# Patient Record
Sex: Female | Born: 1960 | Race: Black or African American | Hispanic: No | Smoking: Former smoker
Health system: Southern US, Community
[De-identification: ages and names within clinical notes are randomized; demographics above are authoritative.]

## PROBLEM LIST (undated history)

## (undated) DIAGNOSIS — F32A Depression, unspecified: Secondary | ICD-10-CM

## (undated) DIAGNOSIS — C50919 Malignant neoplasm of unspecified site of unspecified female breast: Secondary | ICD-10-CM

## (undated) DIAGNOSIS — F329 Major depressive disorder, single episode, unspecified: Secondary | ICD-10-CM

## (undated) DIAGNOSIS — F99 Mental disorder, not otherwise specified: Secondary | ICD-10-CM

## (undated) HISTORY — PX: TUBAL LIGATION: SHX77

---

## 2010-08-29 ENCOUNTER — Emergency Department (HOSPITAL_COMMUNITY)
Admission: EM | Admit: 2010-08-29 | Discharge: 2010-08-29 | Disposition: A | Discharge status: Home / Self Care | Payer: Self-pay | Attending: Emergency Medicine | Admitting: Emergency Medicine

## 2010-08-29 ENCOUNTER — Emergency Department (HOSPITAL_COMMUNITY): Payer: Self-pay

## 2010-08-29 DIAGNOSIS — S86909A Unspecified injury of unspecified muscle(s) and tendon(s) at lower leg level, unspecified leg, initial encounter: Secondary | ICD-10-CM | POA: Insufficient documentation

## 2010-08-29 DIAGNOSIS — W268XXA Contact with other sharp object(s), not elsewhere classified, initial encounter: Secondary | ICD-10-CM | POA: Insufficient documentation

## 2010-08-29 IMAGING — CR DG ANKLE 2V *R*
2 series · 2 of 2 positions shown · non-contrast
Comparison: None.

CLINICAL DATA: Glass table fell on of the right foot with
lacerations to the right ankle.

RIGHT ANKLE - 2 VIEW

[t ankle joint ap right]
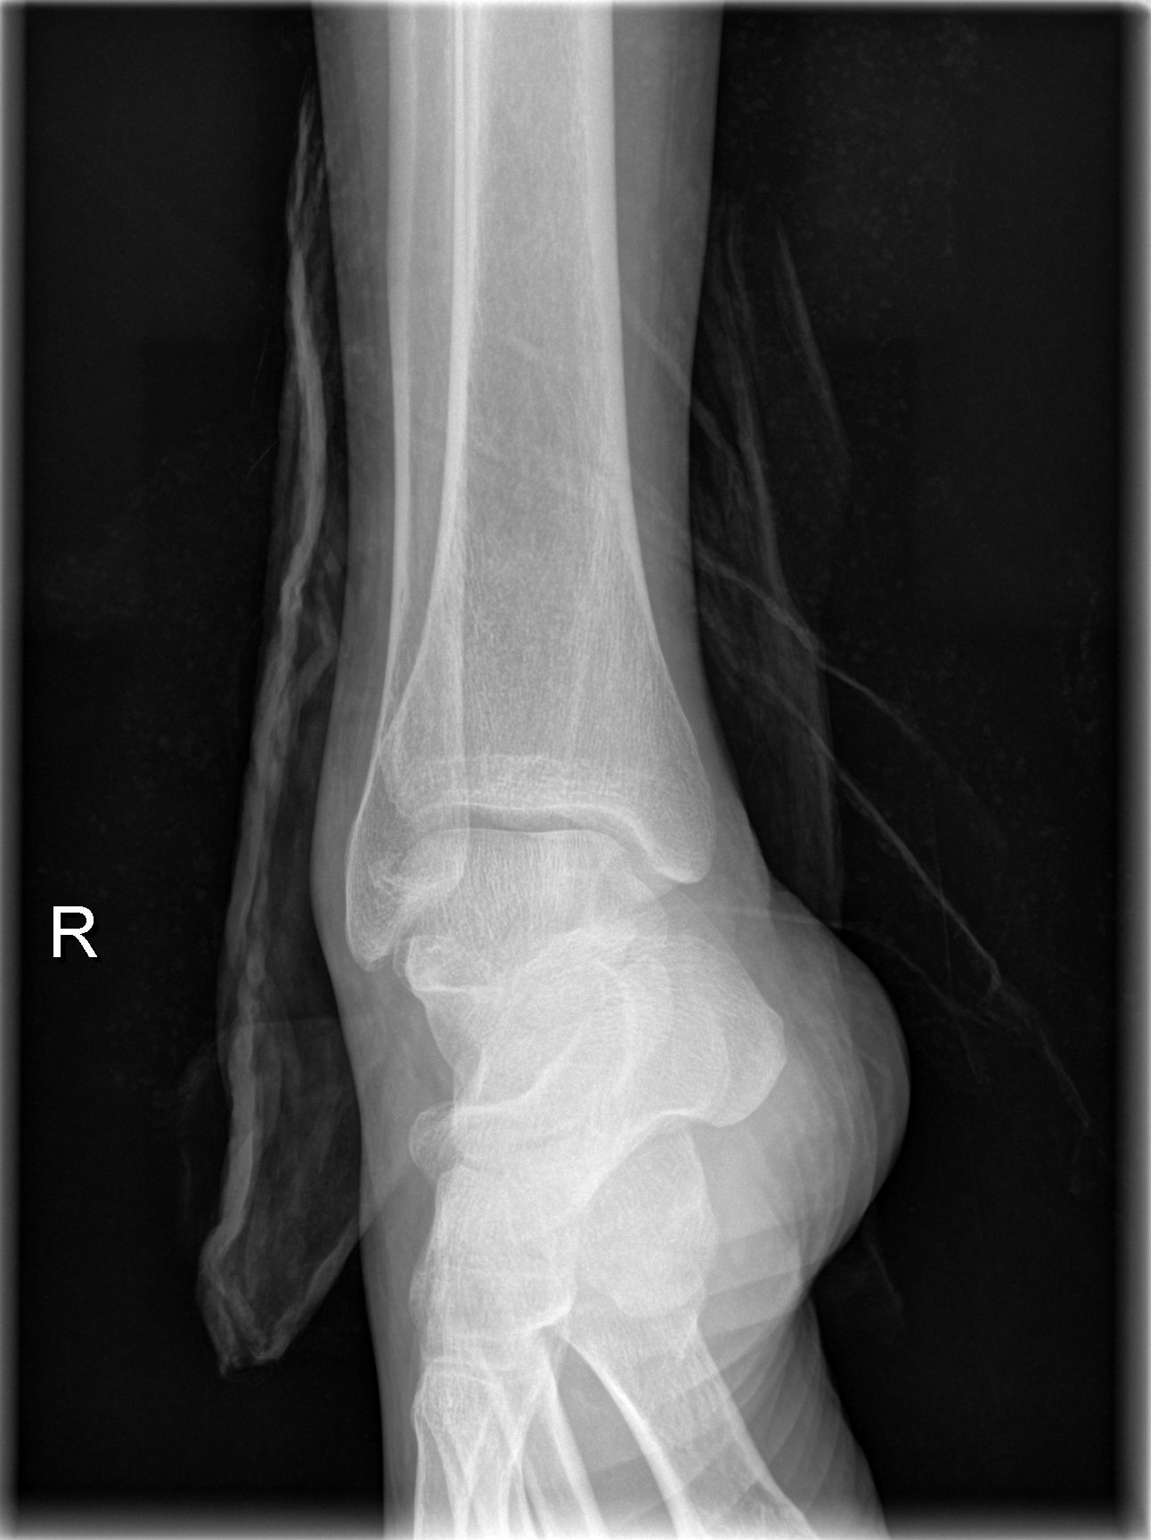

[t ankle joint lat right]
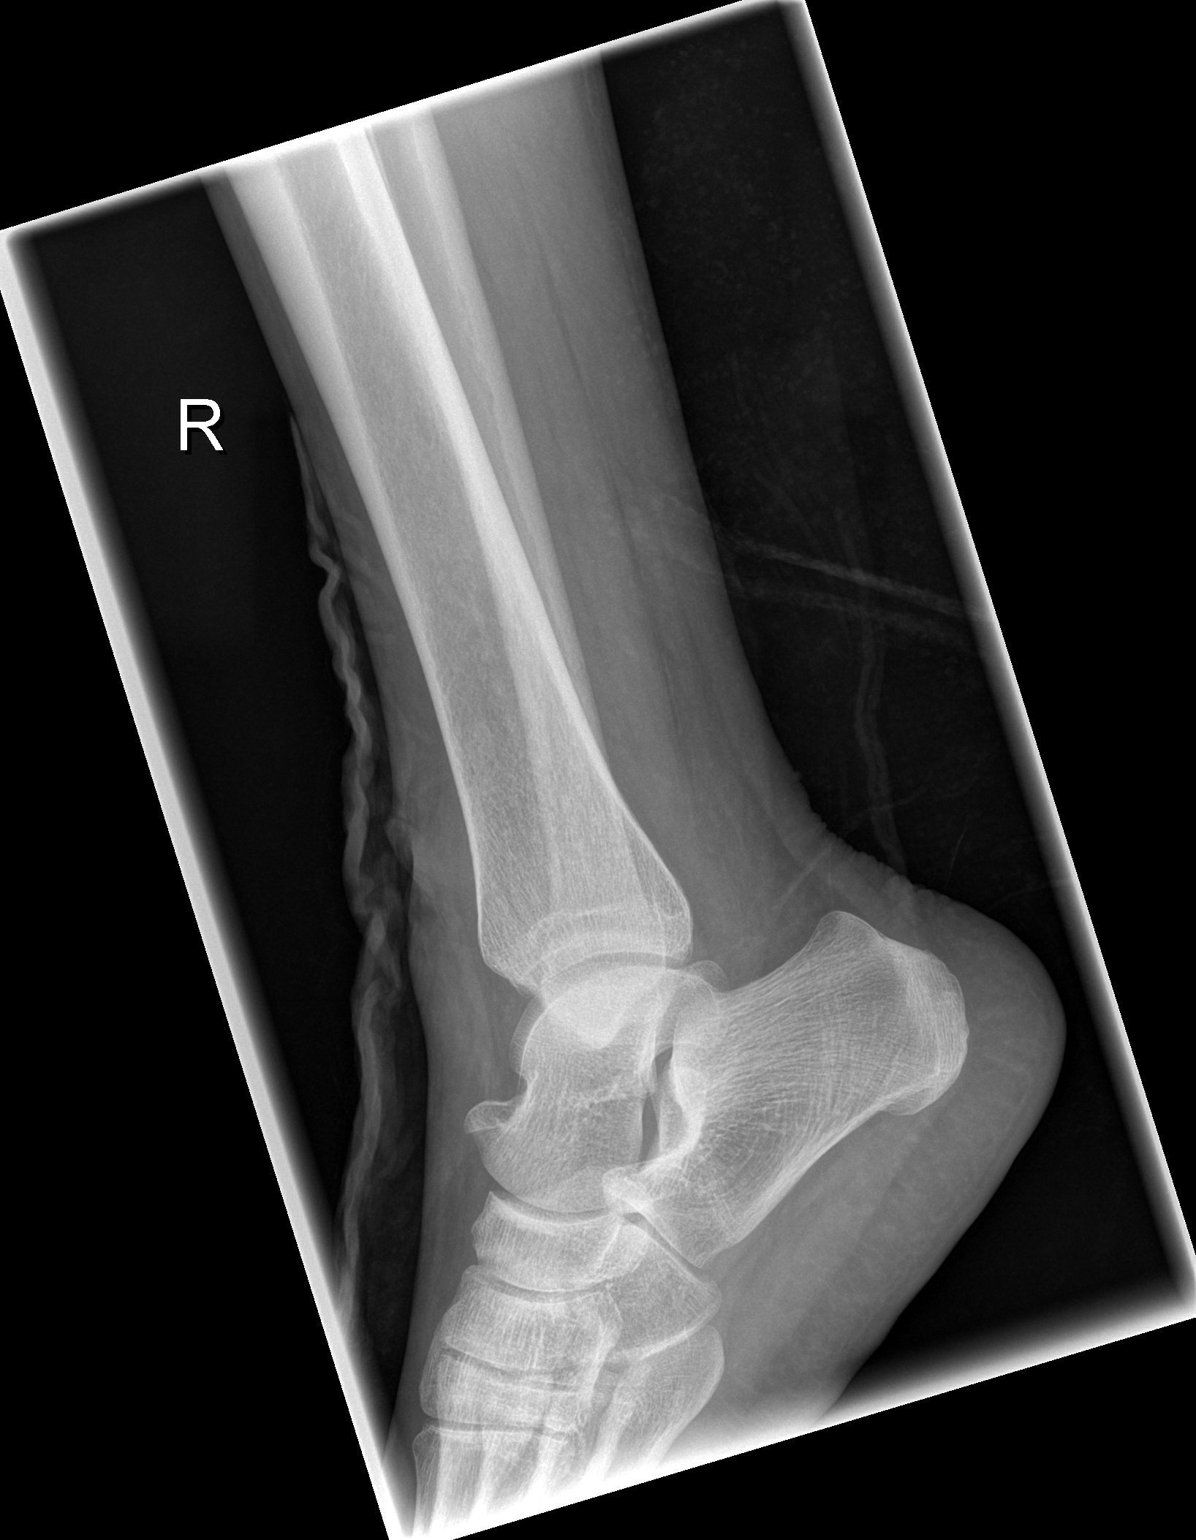

[2 of 2 positions shown; findings below may reference images not displayed]

FINDINGS: Soft tissue defects in the anterior soft tissues over the
ankle consistent with history of laceration.  No radiopaque foreign
bodies are demonstrated in the soft tissues.  No evidence of acute
fracture or subluxation.
IMPRESSION: Soft tissue laceration.  No radiopaque foreign bodies demonstrated.
No acute bony abnormalities.

## 2010-10-10 ENCOUNTER — Emergency Department (HOSPITAL_COMMUNITY)
Admission: EM | Admit: 2010-10-10 | Discharge: 2010-10-10 | Disposition: A | Discharge status: Home / Self Care | Payer: Self-pay | Attending: Emergency Medicine | Admitting: Emergency Medicine

## 2010-10-10 DIAGNOSIS — Z4802 Encounter for removal of sutures: Secondary | ICD-10-CM | POA: Insufficient documentation

## 2011-02-02 ENCOUNTER — Emergency Department (HOSPITAL_COMMUNITY)
Admission: EM | Admit: 2011-02-02 | Discharge: 2011-02-02 | Disposition: A | Discharge status: Home / Self Care | Payer: Self-pay | Attending: Emergency Medicine | Admitting: Emergency Medicine

## 2011-02-02 ENCOUNTER — Emergency Department (HOSPITAL_COMMUNITY): Payer: Self-pay

## 2011-02-02 ENCOUNTER — Encounter: Payer: Self-pay | Admitting: Emergency Medicine

## 2011-02-02 DIAGNOSIS — S20219A Contusion of unspecified front wall of thorax, initial encounter: Secondary | ICD-10-CM

## 2011-02-02 DIAGNOSIS — R109 Unspecified abdominal pain: Secondary | ICD-10-CM | POA: Insufficient documentation

## 2011-02-02 DIAGNOSIS — I10 Essential (primary) hypertension: Secondary | ICD-10-CM

## 2011-02-02 LAB — DIFFERENTIAL
Eosinophils Absolute: 0 10*3/uL (ref 0.0–0.7)
Eosinophils Relative: 0 % (ref 0–5)
Lymphs Abs: 1.9 10*3/uL (ref 0.7–4.0)
Monocytes Absolute: 0.7 10*3/uL (ref 0.1–1.0)
Monocytes Relative: 8 % (ref 3–12)
Neutrophils Relative %: 70 % (ref 43–77)

## 2011-02-02 LAB — CBC
HCT: 42.7 % (ref 36.0–46.0)
Hemoglobin: 14.7 g/dL (ref 12.0–15.0)
MCH: 32.3 pg (ref 26.0–34.0)
MCV: 93.8 fL (ref 78.0–100.0)
RBC: 4.55 MIL/uL (ref 3.87–5.11)

## 2011-02-02 IMAGING — CR DG RIBS W/ CHEST 3+V*L*
3 series · 3 of 3 positions shown · non-contrast
Comparison: None.

CLINICAL DATA: Left anterior rib pain following a fall.  Shortness
of breath.

LEFT RIBS AND CHEST - 3+ VIEW

[w chest pa]
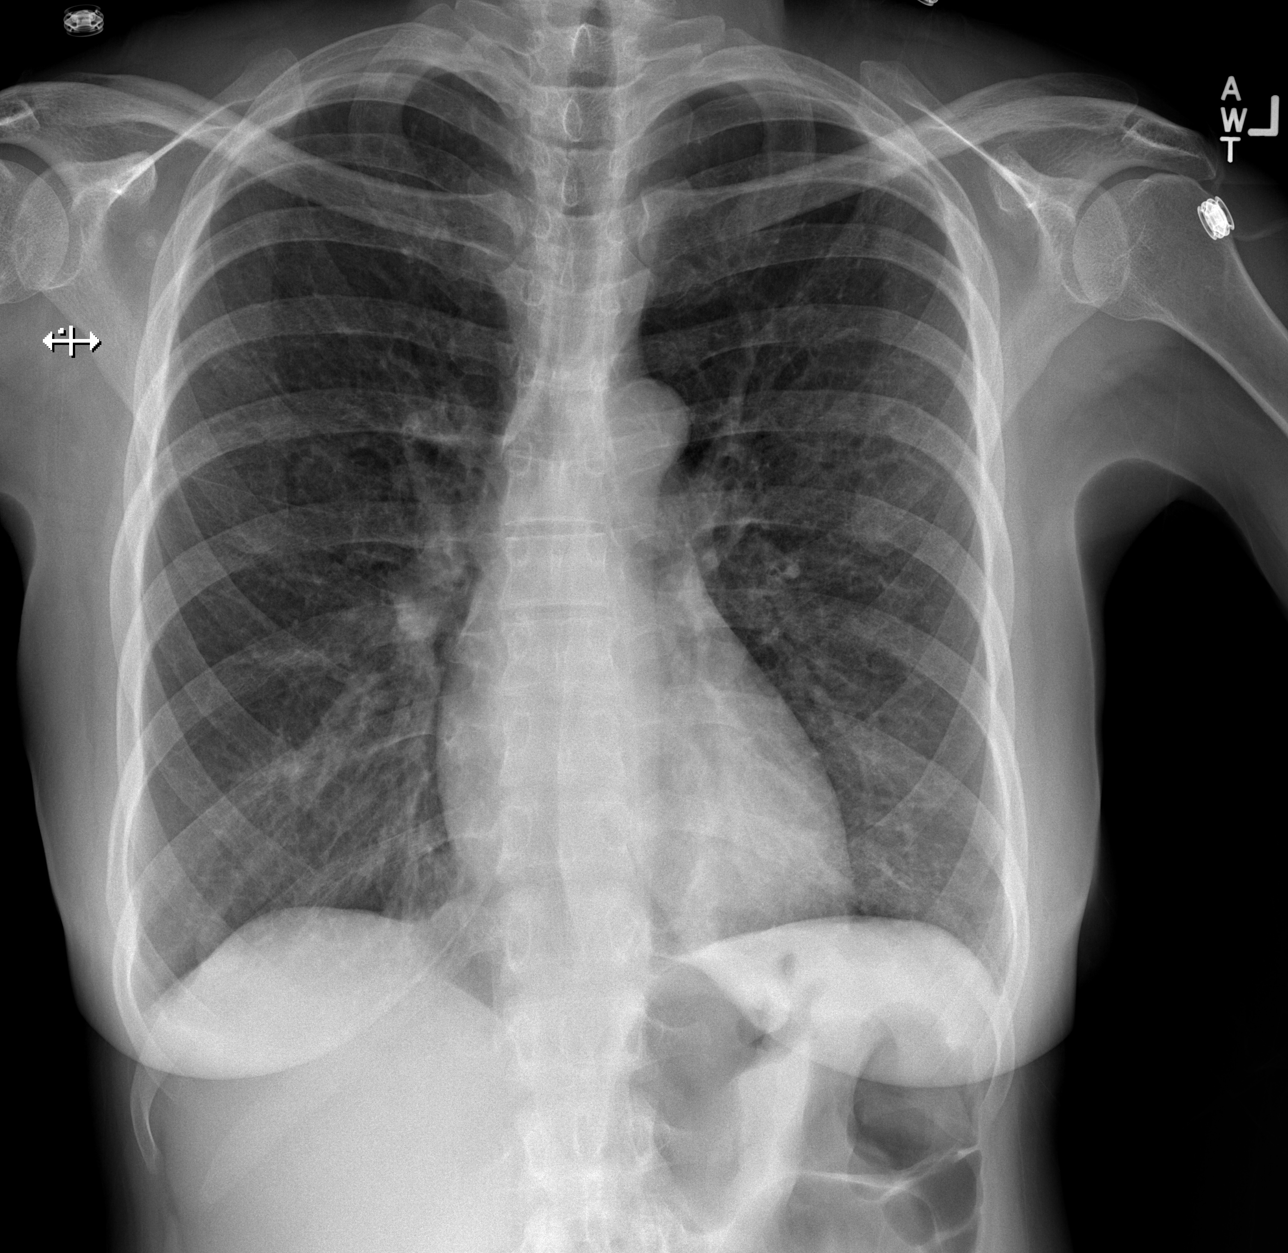

[w ribs ap upper left]
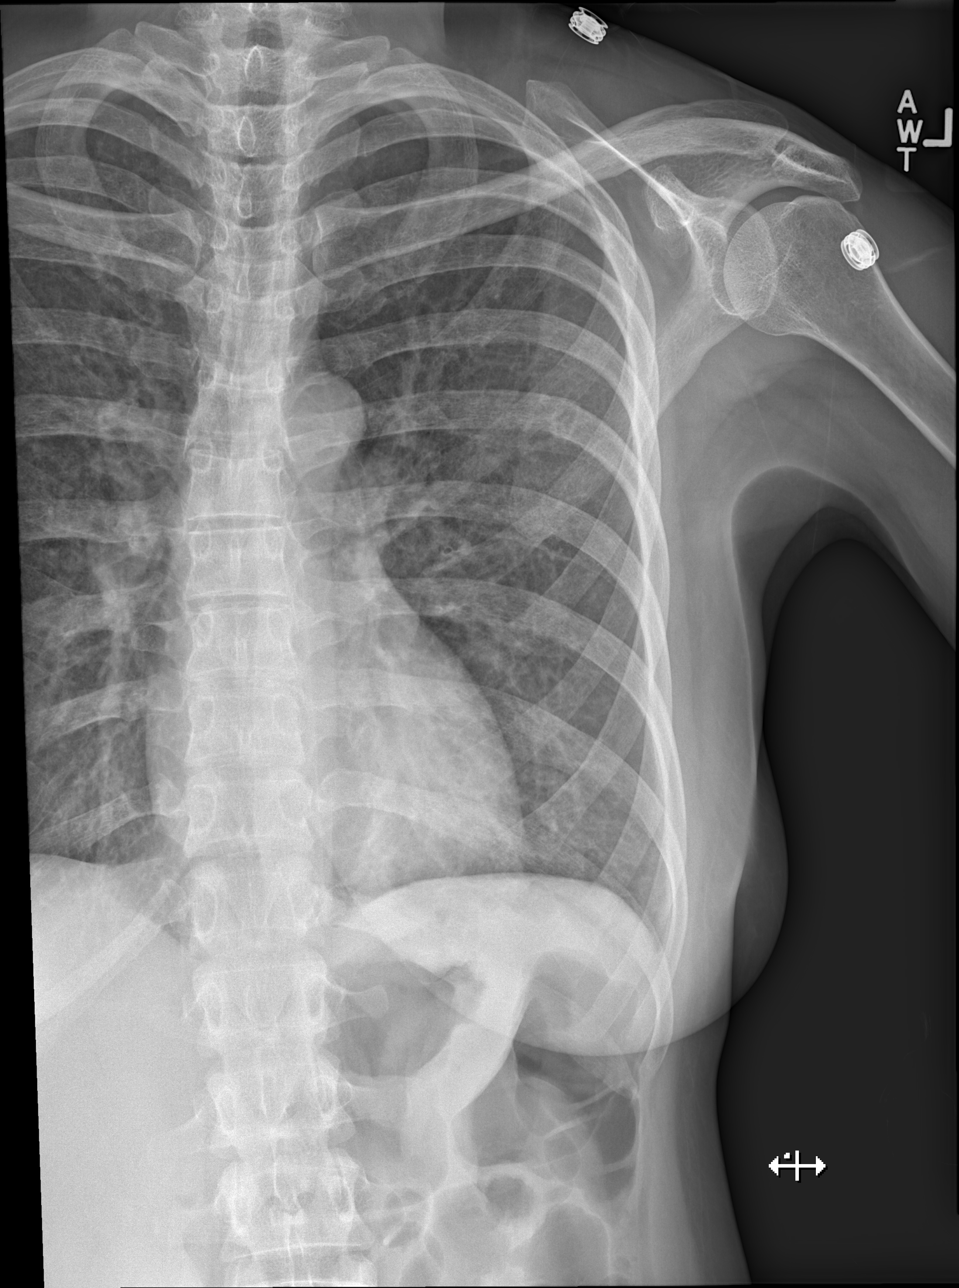

[w ribs ap lower left]
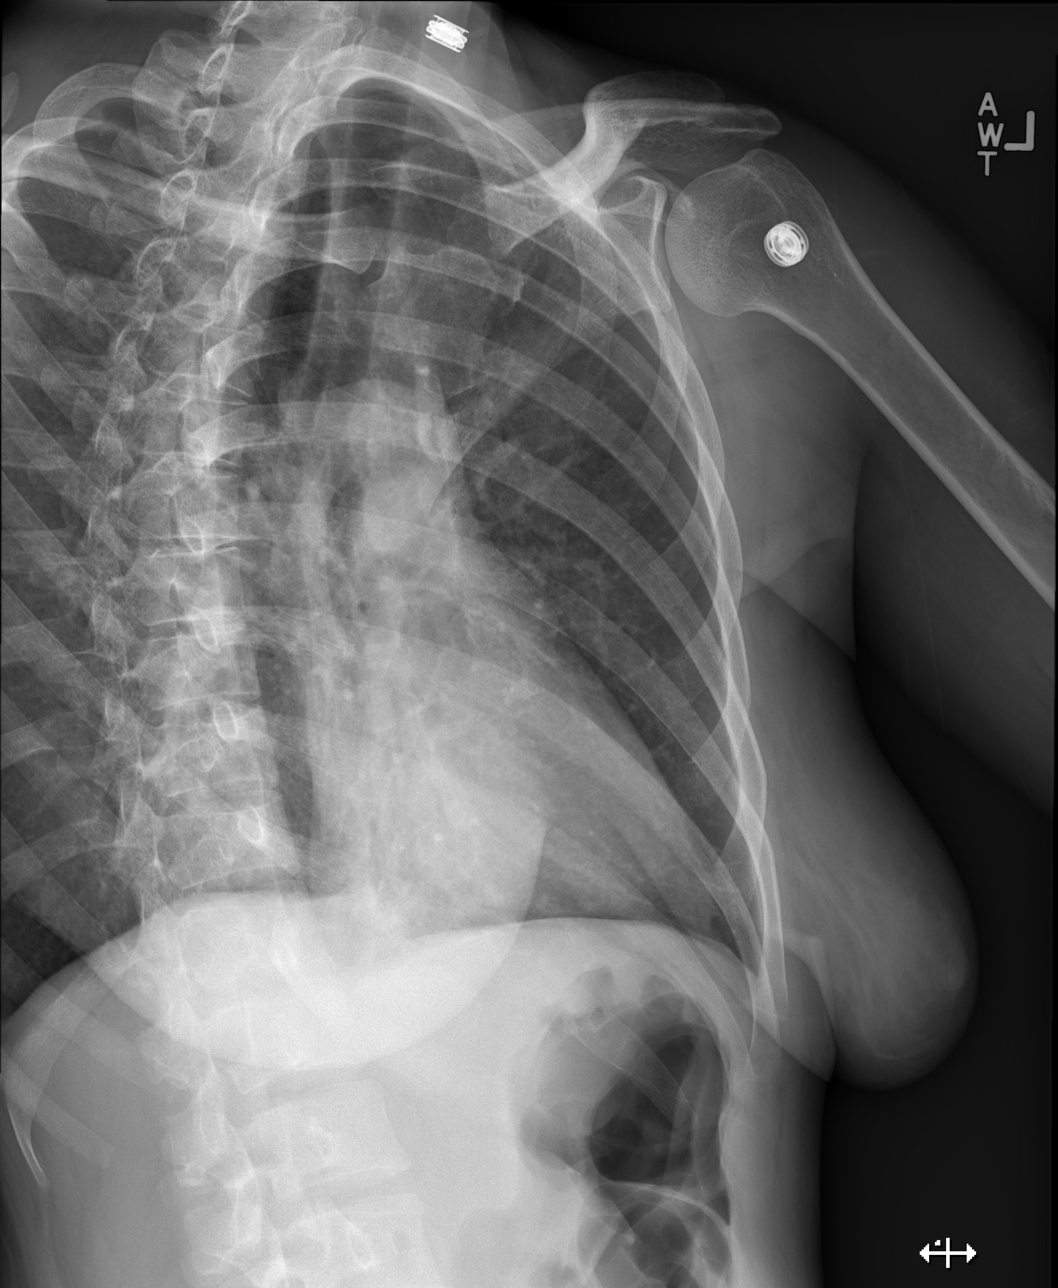

[3 of 3 positions shown; findings below may reference images not displayed]

FINDINGS: Normal sized heart.  Clear lungs.  The lungs are
hyperexpanded with mild diffuse prominence of the pulmonary
vasculature and interstitial markings.  No fracture or pneumothorax
seen.
IMPRESSION: 1.  No fracture or acute abnormality.
2.  Changes of COPD and chronic bronchitis.

## 2011-02-02 MED ORDER — ONDANSETRON HCL 4 MG/2ML IJ SOLN
4.0000 mg | Freq: Once | INTRAMUSCULAR | Status: AC
  Administered 2011-02-02: 4 mg via INTRAVENOUS
  Filled 2011-02-02: qty 2

## 2011-02-02 MED ORDER — HYDROCODONE-ACETAMINOPHEN 5-325 MG PO TABS: 1.0000 | ORAL_TABLET | Freq: Four times a day (QID) | ORAL | Status: AC | PRN

## 2011-02-02 MED ORDER — OXYCODONE-ACETAMINOPHEN 5-325 MG PO TABS
1.0000 | ORAL_TABLET | Freq: Once | ORAL | Status: AC
  Administered 2011-02-02: 1 via ORAL
  Filled 2011-02-02: qty 1

## 2011-02-02 MED ORDER — NAPROXEN 500 MG PO TABS: 500.0000 mg | ORAL_TABLET | Freq: Two times a day (BID) | ORAL | Status: DC

## 2011-02-02 MED ORDER — HYDROMORPHONE HCL PF 1 MG/ML IJ SOLN
1.0000 mg | Freq: Once | INTRAMUSCULAR | Status: AC
  Administered 2011-02-02: 1 mg via INTRAVENOUS
  Filled 2011-02-02: qty 1

## 2011-02-02 NOTE — ED Notes (Signed)
[id number] Expected date:02/02/11<BR> Expected time: 6:00 PM<BR> Means of arrival:Ambulance<BR> Comments:<BR> EMS 52 GC 45 yof  breathing difficulty

## 2011-02-02 NOTE — ED Notes (Signed)
Transported to xray dept

## 2011-02-02 NOTE — ED Notes (Signed)
Pt states that she fell because the floor was wet when she was getting out of the shower and she hit the edge of the sink in the bathroom (two days ago). States that it has gotten worse and it hurts to move. Area is swollen slightly below her left breast. She is having a little bit of trouble breathing but she states she had a cold last week that had not yet resolved completely. In NAD at this time.

## 2011-02-02 NOTE — ED Provider Notes (Signed)
History     CSN: [id number] Arrival date & time: 02/02/2011  6:20 PM   First MD Initiated Contact with Patient 02/02/11 1820      Chief Complaint  Patient presents with  . Flank Pain    (Consider location/radiation/quality/duration/timing/severity/associated sxs/prior treatment) HPI Patient states she fell on the wet floor 2 days ago when she was getting a shower. Patient then hit her left ribs on the corner of the sink. Patient states since that time she's been having pain below her left breast. The area is swollen and tender to the touch. Patient states it hurts when she takes a deep breath. It also hurts when she moves. The pain is moderate to severe. It has not been improving at all. The pain is very sharp in nature. Patient denies any head injury numbness or weakness she denies any abdominal pain. She has not noticed any blood or urine. There's been no nausea or vomiting. History reviewed. No pertinent past medical history.  Past Surgical History  Procedure Date  . Tubal ligation     History reviewed. No pertinent family history.  History  Substance Use Topics  . Smoking status: Current Everyday Smoker  . Smokeless tobacco: Not on file  . Alcohol Use: Yes     on the weekends she has a beer.     OB History    Grav Para Term Preterm Abortions TAB SAB Ect Mult Living                  Review of Systems  All other systems reviewed and are negative.    Allergies  Review of patient's allergies indicates no known allergies.  Home Medications  No current outpatient prescriptions on file.  BP 187/95  Pulse 111  Temp(Src) 98.7 F (37.1 C) (Oral)  Resp 22  SpO2 96%  LMP 01/19/2011  Physical Exam  Nursing note and vitals reviewed. Constitutional: She appears well-developed and well-nourished. No distress.       Hypertensive  HENT:  Head: Normocephalic and atraumatic.  Right Ear: External ear normal.  Left Ear: External ear normal.  Eyes: Conjunctivae are  normal. Right eye exhibits no discharge. Left eye exhibits no discharge. No scleral icterus.  Neck: Neck supple. No tracheal deviation present.  Cardiovascular: Normal rate, regular rhythm and intact distal pulses.   Pulmonary/Chest: Effort normal and breath sounds normal. No stridor. No respiratory distress. She has no wheezes. She has no rales. She exhibits tenderness.    Abdominal: Soft. Bowel sounds are normal. She exhibits no distension. There is no tenderness. There is no rebound and no guarding.  Musculoskeletal: She exhibits no edema and no tenderness.  Neurological: She is alert. She has normal strength. No sensory deficit. Cranial nerve deficit:  no gross defecits noted. She exhibits normal muscle tone. She displays no seizure activity. Coordination normal.  Skin: Skin is warm and dry. No rash noted.  Psychiatric: She has a normal mood and affect.    ED Course  Procedures (including critical care time)   Labs Reviewed  CBC  DIFFERENTIAL  BASIC METABOLIC PANEL   Dg Ribs Unilateral W/chest Left  02/02/2011  *RADIOLOGY REPORT*  Clinical Data: Left anterior rib pain following a fall.  Shortness of breath.  LEFT RIBS AND CHEST - 3+ VIEW  Comparison: None.  Findings: Normal sized heart.  Clear lungs.  The lungs are hyperexpanded with mild diffuse prominence of the pulmonary vasculature and interstitial markings.  No fracture or pneumothorax seen.  IMPRESSION:  1.  No fracture or acute abnormality. 2.  Changes of COPD and chronic bronchitis.  Original Report Authenticated By: Gerald Stabs, M.D.    MDM  Patient without signs of pneumonia or pneumothorax on the chest x-ray. There are no obvious rib fracture though certainly she could have occult rib fractures that are not evident on the chest x-ray. Patient remains hemodynamically stable. She did have hypertension noted on her vital signs. Patient states she does not have a history of this. It could be related to the pain have her do  recommend that she have this followed up as an outpatient.      Kathalene Frames, MD 02/02/11 2045

## 2011-12-23 ENCOUNTER — Emergency Department (HOSPITAL_COMMUNITY)
Admission: EM | Admit: 2011-12-23 | Discharge: 2011-12-25 | Disposition: A | Discharge status: Other Acute Inpatient Hospital | Payer: Self-pay | Attending: Emergency Medicine | Admitting: Emergency Medicine

## 2011-12-23 ENCOUNTER — Encounter (HOSPITAL_COMMUNITY): Payer: Self-pay | Admitting: *Deleted

## 2011-12-23 DIAGNOSIS — F29 Unspecified psychosis not due to a substance or known physiological condition: Secondary | ICD-10-CM | POA: Insufficient documentation

## 2011-12-23 DIAGNOSIS — F329 Major depressive disorder, single episode, unspecified: Secondary | ICD-10-CM | POA: Insufficient documentation

## 2011-12-23 DIAGNOSIS — F1010 Alcohol abuse, uncomplicated: Secondary | ICD-10-CM | POA: Insufficient documentation

## 2011-12-23 DIAGNOSIS — F3289 Other specified depressive episodes: Secondary | ICD-10-CM | POA: Insufficient documentation

## 2011-12-23 DIAGNOSIS — R45850 Homicidal ideations: Secondary | ICD-10-CM | POA: Insufficient documentation

## 2011-12-23 DIAGNOSIS — R45851 Suicidal ideations: Secondary | ICD-10-CM | POA: Insufficient documentation

## 2011-12-23 LAB — CBC WITH DIFFERENTIAL/PLATELET
Eosinophils Absolute: 0 10*3/uL (ref 0.0–0.7)
Eosinophils Relative: 1 % (ref 0–5)
Lymphs Abs: 3.2 10*3/uL (ref 0.7–4.0)
MCH: 32.4 pg (ref 26.0–34.0)
MCV: 93.5 fL (ref 78.0–100.0)
Platelets: 286 10*3/uL (ref 150–400)
RBC: 4.29 MIL/uL (ref 3.87–5.11)

## 2011-12-23 LAB — COMPREHENSIVE METABOLIC PANEL
ALT: 13 U/L (ref 0–35)
BUN: 6 mg/dL (ref 6–23)
Calcium: 9.3 mg/dL (ref 8.4–10.5)
GFR calc Af Amer: >90 mL/min (ref >90)
Glucose, Bld: 86 mg/dL (ref 70–99)
Sodium: 134 mEq/L — ABNORMAL LOW (ref 135–145)
Total Protein: 7.7 g/dL (ref 6.0–8.3)

## 2011-12-23 LAB — ETHANOL: Alcohol, Ethyl (B): 256 mg/dL — ABNORMAL HIGH (ref 0–11)

## 2011-12-23 LAB — RAPID URINE DRUG SCREEN, HOSP PERFORMED
Benzodiazepines: NOT DETECTED
Cocaine: NOT DETECTED
Opiates: NOT DETECTED

## 2011-12-23 NOTE — ED Notes (Signed)
Pt belongings in triage locker 4.  Has been seen and searched by security.

## 2011-12-23 NOTE — ED Notes (Signed)
Pt c/o feeling suicidal; states is a danger to herself; has been cutting herself; states having thoughts x 2 months; tearful; states feels like going to have a nervous breakdown; pt changed into scrubs and wanded by security

## 2011-12-23 NOTE — ED Provider Notes (Signed)
History     CSN: [id number]  Arrival date & time 12/23/11  2042   First MD Initiated Contact with Patient 12/23/11 2133      Chief Complaint  Patient presents with  . Medical Clearance    (Consider location/radiation/quality/duration/timing/severity/associated sxs/prior treatment) HPI This 51 year old female complains of a few months aggressively worsening anxiety depression stress with intermittent suicidal and homicidal ideation with auditory hallucinations getting her commands to tell her to hit her head or hit herself she states she knows that is wrong so is coming here for help now. She has no recent self-mutilation. She does have scars on her legs from prior self cutting over the last few months. She is no headache no fever no chest pain no shortness breath no cough no abdominal pain no vomiting no rashes no recent medical illnesses. She states the auditory hallucinations of gradually gotten worse where now she has difficulty functioning and has not slept in the last few days. She states she will not hurt herself in the emergency department and wants help. She does have suicidal ideation homicidal ideation and auditory hallucinations ongoing worsening over the last few days but gradually started a few months ago. There is no treatment prior to arrival. She did overdosing earlier today on anti-inflammatory pills but states she did not do so. Past Medical History  Diagnosis Date  . Mental disorder   . Depression     Past Surgical History  Procedure Date  . Tubal ligation     No family history on file.  History  Substance Use Topics  . Smoking status: Current Every Day Smoker -- 0.5 packs/day for 30 years  . Smokeless tobacco: Not on file  . Alcohol Use: Yes     on the weekends she has a beer.     OB History    Grav Para Term Preterm Abortions TAB SAB Ect Mult Living                  Review of Systems 10 Systems reviewed and are negative for acute change except as  noted in the HPI. Allergies  Review of patient's allergies indicates no known allergies.  Home Medications   No current outpatient prescriptions on file.  BP 113/76  Pulse 78  Temp 97.9 F (36.6 C) (Oral)  Resp 18  SpO2 100%  Physical Exam  Nursing note and vitals reviewed. Constitutional:       Awake, alert, nontoxic appearance with baseline speech for patient.  HENT:  Head: Atraumatic.  Mouth/Throat: No oropharyngeal exudate.  Eyes: EOM are normal. Pupils are equal, round, and reactive to light. Right eye exhibits no discharge. Left eye exhibits no discharge.  Neck: Neck supple.  Cardiovascular: Normal rate and regular rhythm.   No murmur heard. Pulmonary/Chest: Effort normal and breath sounds normal. No stridor. No respiratory distress. She has no wheezes. She has no rales. She exhibits no tenderness.  Abdominal: Soft. Bowel sounds are normal. She exhibits no mass. There is no tenderness. There is no rebound.  Musculoskeletal: She exhibits no tenderness.       Baseline ROM, moves extremities with no obvious new focal weakness.  Lymphadenopathy:    She has no cervical adenopathy.  Neurological: She is alert.       Awake, alert, cooperative and aware of situation; motor strength bilaterally; sensation normal to light touch bilaterally; peripheral visual fields full to confrontation; no facial asymmetry; tongue midline; major cranial nerves appear intact; no pronator drift, normal finger to  nose bilaterally, baseline gait without new ataxia.  Skin: No rash noted.  Psychiatric:       Anxious depressed and tearful suicidal homicidal psychotic states she will not herself or others in the emergency department    ED Course  Procedures (including critical care time)  Labs Reviewed  CBC WITH DIFFERENTIAL - Abnormal; Notable for the following:    Neutrophils Relative 39 (*)     Lymphocytes Relative 53 (*)     All other components within normal limits  COMPREHENSIVE METABOLIC  PANEL - Abnormal; Notable for the following:    Sodium 134 (*)     Potassium 3.4 (*)     Total Bilirubin 0.2 (*)     All other components within normal limits  ETHANOL - Abnormal; Notable for the following:    Alcohol, Ethyl (B) 256 (*)     All other components within normal limits  SALICYLATE LEVEL - Abnormal; Notable for the following:    Salicylate Lvl 123456 (*)     All other components within normal limits  URINE RAPID DRUG SCREEN (HOSP PERFORMED)  ACETAMINOPHEN LEVEL  PREGNANCY, URINE   No results found.   1. Suicidal ideation   2. Homicidal ideation   3. Psychosis   4. Alcohol abuse       MDM  Patient / Family / Caregiver understand and agree with initial ED impression and plan with expectations set for ED visit. Dispo pending.        Babette Relic, MD 12/26/11 [phone]

## 2011-12-24 MED ORDER — ZOLPIDEM TARTRATE 5 MG PO TABS
5.0000 mg | ORAL_TABLET | Freq: Every evening | ORAL | Status: DC | PRN
  Administered 2011-12-24 (×2): 5 mg via ORAL
  Filled 2011-12-24 (×2): qty 1

## 2011-12-24 MED ORDER — ACETAMINOPHEN 325 MG PO TABS
650.0000 mg | ORAL_TABLET | ORAL | Status: DC | PRN
  Administered 2011-12-24 – 2011-12-25 (×2): 650 mg via ORAL
  Filled 2011-12-24 (×3): qty 2

## 2011-12-24 MED ORDER — ALUM & MAG HYDROXIDE-SIMETH 200-200-20 MG/5ML PO SUSP: 30.0000 mL | ORAL | Status: DC | PRN

## 2011-12-24 MED ORDER — ONDANSETRON HCL 4 MG PO TABS: 4.0000 mg | ORAL_TABLET | Freq: Three times a day (TID) | ORAL | Status: DC | PRN

## 2011-12-24 MED ORDER — LORAZEPAM 1 MG PO TABS: 1.0000 mg | ORAL_TABLET | Freq: Three times a day (TID) | ORAL | Status: DC | PRN
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
## 2011-12-24 NOTE — ED Provider Notes (Signed)
On my AM rounds the patient is asleep.   Per RN, no overnight events.  Carmin Muskrat, MD 12/24/11 [phone]

## 2011-12-24 NOTE — ED Notes (Signed)
PA called and informed pt c/o insomnia and h/a. PA stated he would put in orders.

## 2011-12-24 NOTE — ED Notes (Signed)
Family at bedside. 

## 2011-12-24 NOTE — ED Notes (Signed)
[id number] Expected date:<BR> Expected time:<BR> Means of arrival:<BR> Comments:<BR> Hold no tv

## 2011-12-25 ENCOUNTER — Inpatient Hospital Stay (HOSPITAL_COMMUNITY)
Admission: AD | Admit: 2011-12-25 | Discharge: 2011-12-29 | DRG: 897 | Disposition: A | Discharge status: Home / Self Care | Payer: No Typology Code available for payment source | Source: Ambulatory Visit | Attending: Psychiatry | Admitting: Psychiatry

## 2011-12-25 ENCOUNTER — Encounter (HOSPITAL_COMMUNITY): Payer: Self-pay | Admitting: *Deleted

## 2011-12-25 DIAGNOSIS — F1020 Alcohol dependence, uncomplicated: Secondary | ICD-10-CM

## 2011-12-25 DIAGNOSIS — F29 Unspecified psychosis not due to a substance or known physiological condition: Secondary | ICD-10-CM

## 2011-12-25 DIAGNOSIS — F10151 Alcohol abuse with alcohol-induced psychotic disorder with hallucinations: Secondary | ICD-10-CM | POA: Diagnosis present

## 2011-12-25 DIAGNOSIS — F10988 Alcohol use, unspecified with other alcohol-induced disorder: Principal | ICD-10-CM | POA: Diagnosis present

## 2011-12-25 DIAGNOSIS — Z23 Encounter for immunization: Secondary | ICD-10-CM

## 2011-12-25 HISTORY — DX: Mental disorder, not otherwise specified: F99

## 2011-12-25 HISTORY — DX: Depression, unspecified: F32.A

## 2011-12-25 HISTORY — DX: Major depressive disorder, single episode, unspecified: F32.9

## 2011-12-25 MED ORDER — PNEUMOCOCCAL VAC POLYVALENT 25 MCG/0.5ML IJ INJ
0.5000 mL | INJECTION | INTRAMUSCULAR | Status: AC
  Administered 2011-12-26: 0.5 mL via INTRAMUSCULAR

## 2011-12-25 MED ORDER — INFLUENZA VIRUS VACC SPLIT PF IM SUSP
0.5000 mL | INTRAMUSCULAR | Status: AC
  Administered 2011-12-26: 0.5 mL via INTRAMUSCULAR

## 2011-12-25 MED ORDER — HYDROXYZINE HCL 25 MG PO TABS
25.0000 mg | ORAL_TABLET | Freq: Four times a day (QID) | ORAL | Status: AC | PRN
  Administered 2011-12-25: 25 mg via ORAL

## 2011-12-25 MED ORDER — MAGNESIUM HYDROXIDE 400 MG/5ML PO SUSP: 30.0000 mL | Freq: Every day | ORAL | Status: DC | PRN

## 2011-12-25 MED ORDER — CHLORDIAZEPOXIDE HCL 25 MG PO CAPS
25.0000 mg | ORAL_CAPSULE | ORAL | Status: AC
  Administered 2011-12-28 (×2): 25 mg via ORAL
  Filled 2011-12-25 (×2): qty 1

## 2011-12-25 MED ORDER — CHLORDIAZEPOXIDE HCL 25 MG PO CAPS
25.0000 mg | ORAL_CAPSULE | Freq: Three times a day (TID) | ORAL | Status: AC
  Administered 2011-12-27 (×3): 25 mg via ORAL
  Filled 2011-12-25 (×3): qty 1

## 2011-12-25 MED ORDER — LOPERAMIDE HCL 2 MG PO CAPS: 2.0000 mg | ORAL_CAPSULE | ORAL | Status: AC | PRN

## 2011-12-25 MED ORDER — CHLORDIAZEPOXIDE HCL 25 MG PO CAPS
25.0000 mg | ORAL_CAPSULE | Freq: Every day | ORAL | Status: AC
  Administered 2011-12-29: 25 mg via ORAL
  Filled 2011-12-25: qty 1

## 2011-12-25 MED ORDER — ADULT MULTIVITAMIN W/MINERALS CH
1.0000 | ORAL_TABLET | Freq: Every day | ORAL | Status: DC
Start: 2011-12-25 — End: 2011-12-29
  Administered 2011-12-25 – 2011-12-29 (×5): 1 via ORAL
  Filled 2011-12-25 (×8): qty 1

## 2011-12-25 MED ORDER — VITAMIN B-1 100 MG PO TABS
100.0000 mg | ORAL_TABLET | Freq: Every day | ORAL | Status: DC
  Administered 2011-12-26 – 2011-12-29 (×4): 100 mg via ORAL
  Filled 2011-12-25 (×6): qty 1

## 2011-12-25 MED ORDER — ONDANSETRON 4 MG PO TBDP: 4.0000 mg | ORAL_TABLET | Freq: Four times a day (QID) | ORAL | Status: AC | PRN

## 2011-12-25 MED ORDER — CHLORDIAZEPOXIDE HCL 25 MG PO CAPS
25.0000 mg | ORAL_CAPSULE | Freq: Four times a day (QID) | ORAL | Status: AC | PRN
  Administered 2011-12-26: 25 mg via ORAL

## 2011-12-25 MED ORDER — CHLORDIAZEPOXIDE HCL 25 MG PO CAPS
25.0000 mg | ORAL_CAPSULE | Freq: Four times a day (QID) | ORAL | Status: AC
  Administered 2011-12-25 – 2011-12-26 (×6): 25 mg via ORAL
  Filled 2011-12-25 (×6): qty 1

## 2011-12-25 MED ORDER — ALUM & MAG HYDROXIDE-SIMETH 200-200-20 MG/5ML PO SUSP: 30.0000 mL | ORAL | Status: DC | PRN

## 2011-12-25 MED ORDER — ACETAMINOPHEN 325 MG PO TABS
650.0000 mg | ORAL_TABLET | Freq: Four times a day (QID) | ORAL | Status: DC | PRN
  Administered 2011-12-26 – 2011-12-28 (×2): 650 mg via ORAL

## 2011-12-25 MED ORDER — THIAMINE HCL 100 MG/ML IJ SOLN: 100.0000 mg | Freq: Once | INTRAMUSCULAR | Status: DC
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
## 2011-12-25 NOTE — Progress Notes (Signed)
Psychoeducational Group Note  Date:  12/25/2011 Time: 2000 Group Topic/Focus:  Wrap-Up Group:   The focus of this group is to help patients review their daily goal of treatment and discuss progress on daily workbooks.  Participation Level:  Active  Participation Quality:  Appropriate and Attentive  Affect:  Appropriate  Cognitive:  Appropriate  Insight:  Good  Engagement in Group:  Good  Additional Comments:    [name] [name] 12/25/2011, 10:37 PM

## 2011-12-25 NOTE — ED Provider Notes (Signed)
Pt is accpeted to [facility] by Dr. Dell Ponto, will be transferred.    Brenda [name]. Brenda Woodle, MD 12/25/11 1506
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
## 2011-12-25 NOTE — ED Notes (Signed)
Patient given items for shower when one is available

## 2011-12-26 DIAGNOSIS — F1994 Other psychoactive substance use, unspecified with psychoactive substance-induced mood disorder: Secondary | ICD-10-CM

## 2011-12-26 DIAGNOSIS — F10151 Alcohol abuse with alcohol-induced psychotic disorder with hallucinations: Secondary | ICD-10-CM | POA: Diagnosis present

## 2011-12-26 DIAGNOSIS — F1910 Other psychoactive substance abuse, uncomplicated: Secondary | ICD-10-CM

## 2011-12-26 MED ORDER — TRAZODONE HCL 50 MG PO TABS
50.0000 mg | ORAL_TABLET | Freq: Every evening | ORAL | Status: DC | PRN
  Administered 2011-12-26 – 2011-12-27 (×2): 50 mg via ORAL
  Filled 2011-12-26 (×2): qty 1

## 2011-12-26 MED ORDER — NICOTINE 21 MG/24HR TD PT24
21.0000 mg | MEDICATED_PATCH | Freq: Every day | TRANSDERMAL | Status: DC
  Administered 2011-12-26: 21 mg via TRANSDERMAL
  Filled 2011-12-26 (×8): qty 1

## 2011-12-26 MED ORDER — RISPERIDONE 1 MG PO TBDP
1.0000 mg | ORAL_TABLET | Freq: Every day | ORAL | Status: DC
  Administered 2011-12-26 – 2011-12-28 (×3): 1 mg via ORAL
  Filled 2011-12-26 (×7): qty 1

## 2011-12-26 NOTE — [facility] Suicide Risk Assessment (Signed)
Suicide Risk Assessment  Admission Assessment     Nursing information obtained from:  Patient Demographic factors:  Low socioeconomic status;Unemployed Current Mental Status:  See below Loss Factors:  Financial problems / change in socioeconomic status Historical Factors:  Family history of mental illness or substance abuse, 1 SI attempt in past Risk Reduction Factors:  Living with another person, especially a relative;Positive social support;Sense of responsibility to family  CLINICAL FACTORS:   Alcohol/Substance Abuse/Dependencies  COGNITIVE FEATURES THAT CONTRIBUTE TO RISK:  Closed-mindedness    SUICIDE RISK:   Moderate:  Frequent suicidal ideation with limited intensity, and duration, some specificity in terms of plans, no associated intent, good self-control, limited dysphoria/symptomatology, some risk factors present, and identifiable protective factors, including available and accessible social support.  PLAN OF CARE:  Mental Status Examination/Evaluation:  Objective: Appearance: Disheveled   Eye Contact:: Good   Speech: Normal Rate   Volume: Normal   Mood: Anxious   Affect: Congruent   Thought Process: Goal Directed and Linear   Orientation: Full   Thought Content: NO AVH today  Suicidal Thoughts: No   Homicidal Thoughts: No   Memory: Immediate; Good  Recent; Good  Remote; Good   Judgement: Impaired   Insight: Shallow   Psychomotor Activity: Normal   Concentration: Good   Recall: Good   Akathisia: No   Handed: Right   AIMS (if indicated):   Assets: Desire for Improvement  Housing  Resilience  Social Support   Sleep: Number of Hours: 6.75    Laboratory/X-Ray  Psychological Evaluation(s)      Assessment:  AXIS I: Psychtoic d/o  Nos, ETOH Abuse, r/o Substance Induced Mood Disorder  AXIS II: Deferred  AXIS III:  Past Medical History   Diagnosis  Date   .  Mental disorder    .  Depression     AXIS IV: economic problems, educational problems, housing  problems, occupational problems, other psychosocial or environmental problems and problems with primary support group  AXIS V: 51-60 moderate symptoms  Treatment Plan/Recommendations:   Treatment Plan Summary:  Daily contact with patient to assess and evaluate symptoms and progress in treatment   Add Trazodone for sleep  Brenda [name] 12/26/2011, 2:29 PM

## 2011-12-26 NOTE — Progress Notes (Signed)
D   Pt has been pleasant on approach and cooperative   She denies any withdrawal symptoms   Her thinking is logical and coherent and she denies any psychotic symptoms at present   She does admit to feeling depressed and sad   She is compliant with treatment A   Verbal support given  Medications administered and effectiveness monitored  Q 15 min checks R  Pt safe at present
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
## 2011-12-26 NOTE — H&P (Signed)
  Pt was seen by me today and I agree with the key elements documented in H&P.  

## 2011-12-26 NOTE — Progress Notes (Signed)
Patient ID: Avree Heinert, female   DOB: 1960/09/08, 51 y.o.   MRN: [id number]  [facility] Group Notes:  (Counselor/Nursing/MHT/Case Management/Adjunct)  12/26/2011 1:15 PM  Type of Therapy:  Group Therapy, Dance/Movement Therapy   Participation Level:  Active  Participation Quality:  Appropriate and Drowsy  Affect:  Blunted and Flat  Cognitive:  Oriented  Insight:  Limited  Engagement in Group:  Good  Engagement in Therapy:  Good  Modes of Intervention:  Clarification, Problem-solving, Role-play, Socialization and Support  Summary of Progress/Problems:  Pt attended aftercare planning group and counseling group on utilizing healthy coping skills.  Pt participated and was able to identify an individual coping skill as well as modeling the coping skills of others using movement.  Pt was able to maintain eye contact with group members and counselors.    Einar Gip 12/26/2011. 11:16 AM

## 2011-12-26 NOTE — Progress Notes (Signed)
Patient ID: Brenda [name], female   DOB: 11-05-1960, 51 y.o.   MRN: [id number] D)  Has been out and about on the hall this evening, was in dayroom watching tv, attended group, interacting well with staff and select peers.  Stated was still having some anxiety from w/d but the voices were nearly gone.  Has been pleasant and cooperative. A)  Will continue to monitor for safety, w/d sx, offer support and encouragement.  Continue POC. R)  Receptive, appreciative, compliant.

## 2011-12-27 NOTE — Progress Notes (Signed)
Patient ID: Brenda [name], female   DOB: [birth year], 51 y.o.   MRN: [id number]  [facility] Group Notes:  (Counselor/Nursing/MHT/Case Management/Adjunct)  12/27/2011 11 AM  Type of Therapy:  Aftercare Planning, Group Therapy, Dance/Movement Therapy   Participation Level:  Active  Participation Quality:  Appropriate  Affect:  Appropriate, Blunted and Flat  Cognitive:  Oriented  Insight:  Limited  Engagement in Group:  Good  Engagement in Therapy:  Limited  Modes of Intervention:  Clarification, Problem-solving, Role-play, Socialization and Support  Summary of Progress/Problems: Pt. attended and participated in aftercare planning group and counseling group on healthy support systems. Pt. accepted information on suicide prevention, warning signs to look for with suicide and crisis line numbers to use. The pt. agreed to call crisis line numbers if having warning signs or having thoughts of suicide.  Pt participated in a movement based intervention to demonstrate what a healthy support might look like.  Pt participated and shared but was quiet in her tone of voice.   Einar Gip 12/27/2011. 11:18 AM

## 2011-12-27 NOTE — Progress Notes (Signed)
Psychoeducational Group Note  Date:  12/27/2011 Time:  2000  Group Topic/Focus:  Wrap-Up Group:   The focus of this group is to help patients review their daily goal of treatment and discuss progress on daily workbooks.  Participation Level:  Active  Participation Quality:  Appropriate  Affect:  Appropriate  Cognitive:  Appropriate  Insight:  Good  Engagement in Group:  Good  Additional Comments:  Patient attended and participated in group tonight. She reports that today was a good one for her. She talked to her nurse and her family. She reports that her family will visit tomorrow. Patient advised that she attends [employer] and that his her support system.  Salley [facility] 12/27/2011, 9:25 PM

## 2011-12-27 NOTE — Progress Notes (Signed)
D   Pt has been appropriate and pleasant   She denies withdrawal symptoms and denies suicidal and homicidal ideation  She denies voices  Her thinking is logical and coherent   She interacts well with others A   Verbal support given  Medications administered and effectiveness monitored  Q 15 min checks R   Pt safe at present time

## 2011-12-27 NOTE — Progress Notes (Signed)
  Brenda [name] is a 51 y.o. 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  Subjective/Objective: Those pills must have helped! She is feeling much better slept well and is no longer having AH. No tremor and no other withdrawal symptoms noted.    Filed Vitals:   12/27/11 0801  BP: 95/65  Pulse: 108  Temp:   Resp:     Lab Results:   BMET    Component Value Date/Time   NA 134* 12/23/2011 2145   K 3.4* 12/23/2011 2145   CL 98 12/23/2011 2145   CO2 20 12/23/2011 2145   GLUCOSE 86 12/23/2011 2145   BUN 6 12/23/2011 2145   CREATININE 0.58 12/23/2011 2145   CALCIUM 9.3 12/23/2011 2145   GFRNONAA >90 12/23/2011 2145   GFRAA >90 12/23/2011 2145    Medications:  Scheduled:     . chlordiazePOXIDE  25 mg Oral QID   Followed by  . chlordiazePOXIDE  25 mg Oral TID   Followed by  . chlordiazePOXIDE  25 mg Oral BH-qamhs   Followed by  . chlordiazePOXIDE  25 mg Oral Daily  . multivitamin with minerals  1 tablet Oral Daily  . nicotine  21 mg Transdermal Q0600  . risperiDONE  1 mg Oral QHS  . thiamine  100 mg Intramuscular Once  . thiamine  100 mg Oral Daily     PRN Meds acetaminophen, alum & mag hydroxide-simeth, chlordiazePOXIDE, hydrOXYzine, loperamide, magnesium hydroxide, ondansetron, traZODone  Plan: No med changes indicated today. Will need to get her into care at [facility].   [name],MICKIE D. 12/27/2011
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
## 2011-12-27 NOTE — Progress Notes (Signed)
Psychoeducational Group Note  Date:  12/27/2011 Time:  0945 am  Group Topic/Focus:  Making Healthy Choices:   The focus of this group is to help patients identify negative/unhealthy choices they were using prior to admission and identify positive/healthier coping strategies to replace them upon discharge.  Participation Level:  Active  Participation Quality:  Appropriate  Affect:  Appropriate  Cognitive:  Alert  Insight:  Good  Engagement in Group:  Good  Additional Comments:    Migdalia Dk 12/27/2011, 10:29 AM

## 2011-12-27 NOTE — Progress Notes (Addendum)
[facility] Adult Inpatient Family/Significant Other Suicide Prevention Education  Suicide Prevention Education:  Contact Attempts: [name] (neice) [phone] has been identified by the patient as the family member/significant other with whom the patient will be residing, and identified as the person(s) who will aid the patient in the event of a mental health crisis.  With written consent from the patient, two attempts were made to provide suicide prevention education, prior to and/or following the patient's discharge.  We were unsuccessful in providing suicide prevention education.  A suicide education pamphlet was given to the patient to share with family/significant other.  Date and time of first attempt: 12-27-11 11:46 AM Date and time of second attempt: 12-27-11 by Kendal Hymen at 3:30pm [facility] 12/27/2011, 11:45 AM

## 2011-12-28 MED ORDER — TRAZODONE HCL 50 MG PO TABS: 50.0000 mg | ORAL_TABLET | Freq: Every evening | ORAL | Status: DC | PRN

## 2011-12-28 MED ORDER — RISPERIDONE 1 MG PO TBDP: 1.0000 mg | ORAL_TABLET | Freq: Every day | ORAL | Status: DC

## 2011-12-28 NOTE — Progress Notes (Signed)
Psychoeducational Group Note  Date:  12/28/2011 Time:  1000  Group Topic/Focus:  Wellness Toolbox:   The focus of this group is to discuss various aspects of wellness, balancing those aspects and exploring ways to increase the ability to experience wellness.  Patients will create a wellness toolbox for use upon discharge.  Participation Level:  Did Not Attend  Participation Quality:    Affect:    Cognitive:    Insight:    Engagement in Group:    Additional Comments:  Pt did not attend  Korry Dalgleish M 12/28/2011, 1:16 PM
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
## 2011-12-28 NOTE — Progress Notes (Signed)
[place] Group Notes:  (Counselor/Nursing/MHT/Case Management/Adjunct)  12/28/2011 2:06 PM  Type of Therapy:  Group Therapy  Participation Level:  Active  Participation Quality:  Attentive and Sharing  Affect:  Appropriate  Cognitive:  Oriented  Insight:  Limited  Engagement in Group:  Good  Engagement in Therapy:  Good  Modes of Intervention:  Education, Problem-solving and Support  Summary of Progress/Problems:  Patient was active in group discussion but comments were not relevant to topic. For example when talking about personal barriers to wellness, she started talking about her granddaughter wearing her clothes. Several attempts were made to bring patient back to topic.  Karrigan Messamore, Caren Griffins 12/28/2011, 2:06 PM
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
## 2011-12-28 NOTE — Progress Notes (Signed)
[facility] In Patient Progress Note 12/28/2011 11:14 AM Brenda [name] Brenda [name] June 06, 1960 [id number] Hospital day #:3 Diagnosis:   AXIS I: Substance Abuse and Substance Induced Mood Disorder  AXIS II: Deferred  AXIS III:  Past Medical History   Diagnosis  Date   .  Mental disorder    .  Depression    AXIS IV: economic problems, educational problems, housing problems, occupational problems, other psychosocial or environmental problems and problems with primary support group  AXIS V: 51-60 moderate symptoms  The patient was see and reports the following.  ADL's:  Intact Sleep:  Returned to normal Appetite:ok  Groups:Good  Subjective:  Brenda [name] states she was admitted as she was not safe in her current environment.  She denies any alcohol or substance abuse problems and does not feel the need for rehab or further treatment.  She states she is ready to be discharged and can return to live with her son.   height is 5\' 4"  (1.626 m) and weight is 49.896 kg (110 lb). Her oral temperature is 97.6 F (36.4 C). Her blood pressure is 95/65 and her pulse is 108. Her respiration is 16.   Objective: Patient is alert and oriented x 3.  She denies any withdrawal symptoms.  Ros: Constitutional: WD WN Adult in NAD ENT:      negative for runny nose, sore throat, congestion, dysphagia COR:     negative for cough, SOB, chest pain, wheezing GI:         negative for Nausea, vomiting, diarrhea, constipation, abdominal pain Neuro:  negative for dizziness, blurred vision, headaches, numbness or tingling Ortho:   negative for limb pain, swelling, change in ambulatory status.  Mental Status Exam Level of Consciousness: awake Orientation: x 3 General Appearance:  disheveled Behavior:  cooperative Eye Contact:  good Motor Behavior:  normal Speech:  clear Mood: euthymic Suicidal Ideation: No suicidal ideation, no plan, no intent, no means. Homicidal Ideation:  No homicidal ideation, no plan, no intent, no  means.  Affect:  congruent Anxiety Level: mild Thought Process:  linear Thought Content:  No AVH Perception:  intact Judgment:  Fair, Insight:  good, Cognition: at least average Sleep:  Number of Hours: 6.75   Lab Results: No results found for this or any previous visit (from the past 48 hour(s)). Labs are reviewed. Physical Findings: AIMS: CIWA-Ar Total: 1  CIWA:  CIWA-Ar Total: 1  COWS:     Medication:  . chlordiazePOXIDE  25 mg Oral TID   Followed by  . chlordiazePOXIDE  25 mg Oral BH-qamhs   Followed by  . chlordiazePOXIDE  25 mg Oral Daily  . multivitamin with minerals  1 tablet Oral Daily  . nicotine  21 mg Transdermal Q0600  . risperiDONE  1 mg Oral QHS  . thiamine  100 mg Intramuscular Once  . thiamine  100 mg Oral Daily    Treatment Plan Summary: 1. Continue admission for crisis management and stabilization. 2. Medication management to reduce current symptoms to base line and improve the  patient's overall level of functioning 3. Treat health problems as indicated. 4. Develop treatment plan to decrease risk of relapse upon discharge and the need for readmission. 5. Psycho-social education regarding relapse prevention and self care. 6. Health care follow up as needed for medical problems. 7. Restart home medications where appropriate.   Plan: 1. CM will contact family for follow up to see if she can return to live with her son.  2. Will anticipate discharge based on  the information from this contact. 3. The patient will continue all medication as written. 4. Labs are reviewed. Brenda [name]. Brenda [name] PAC 12/28/2011, 11:14 AM

## 2011-12-28 NOTE — Progress Notes (Signed)
Psychoeducational Group Note  Date:  12/28/2011 Time: 1100  Group Topic/Focus:  Self Care:   The focus of this group is to help patients understand the importance of self-care in order to improve or restore emotional, physical, spiritual, interpersonal, and financial health.  Participation Level:  Active  Participation Quality:  Appropriate  Affect:  Appropriate  Cognitive:  Appropriate  Insight:  Good  Engagement in Group:  Good  Additional Comments:  Pt participated and processed in group.  Kathlen Brunswick, Hazell Siwik 12/28/2011, 4:10 PM

## 2011-12-28 NOTE — Progress Notes (Signed)
Agree with the assessment

## 2011-12-28 NOTE — Progress Notes (Signed)
[facility] Case Management Discharge Plan:  Will you be returning to the same living situation after discharge: Yes,  with son At discharge, do you have transportation home?:Yes,  son Do you have the ability to pay for your medications:Yes,  mental health  Interagency Information:     Release of information consent forms completed and in the chart;  Patient's signature needed at discharge.  Patient to Follow up at:  Follow-up Information    Follow up with [facility]. (Walk in M-F between 8 and 9 AM for your hospital follow up  appointment.  This is where you will see a Dr for your medication)    Contact information:   [place] [phone]         Patient denies SI/HI:   Yes,  yes    Safety Planning and Suicide Prevention discussed:  Yes,  yes  Barrier to discharge identified:No.  Summary and Recommendations:   Trish Mage 12/28/2011, 3:59 PM

## 2011-12-28 NOTE — Progress Notes (Signed)
Patient ID: Brenda [name], female   DOB: [birth year], 51 y.o.   MRN: [id number] D)  Has been out and about on hall, brightens on approach, states feeling better this evening.  States the voices she had been hearing are gone now, feels that the meds are helping, denies w/d sx.   Came to med window for hs meds, participating in milieu and interacting appropriately with staff and select peers. A)  Will continue to monitor for safety, POC R)  Compliant,appreciative.

## 2011-12-28 NOTE — Progress Notes (Signed)
Patient ID: Brenda [name], female   DOB: 06-11-1960, 51 y.o.   MRN: [id number] Patient has been calm and cooperative today.  Her librium protocol has been discontinued.  Her thought processes are clear.  She attended treatment team today and her discharge planning is in process for tomorrow. It is probable that she will discharged to her son's house with some follow up in place for medication management.  She denies any SI/HI/AVH.    Initiate discharge paperwork for tomorrow's discharge; prescriptions and medication samples to be readied for patient.
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
## 2011-12-28 NOTE — Progress Notes (Signed)
Psychoeducational Group Note  Date:  12/28/2011 Time:  2000  Group Topic/Focus:  Wrap-Up Group:   The focus of this group is to help patients review their daily goal of treatment and discuss progress on daily workbooks.  Participation Level:  Active  Participation Quality:  Appropriate  Affect:  Appropriate  Cognitive:  Appropriate  Insight:  Good  Engagement in Group:  Good  Additional Comments:  Patient attended and participated in group tonight. She reports that her day was fine. She took her medication, went to her groups, watch television, had a snack, wash her hair, took a shower, went to the gym, and took a nap. Patient reports that she take care of her wellness by going to church, shopping, walking and staying away from negative people  Brenda [name] 12/28/2011, 9:36 PM

## 2011-12-28 NOTE — Treatment Plan (Signed)
Interdisciplinary Treatment Plan Update (Adult)  Date: 12/28/2011  Time Reviewed: 3:36 PM   Progress in Treatment: Attending groups: Yes Participating in groups: Yes Taking medication as prescribed: Yes Tolerating medication: Yes   Family/Significant other contact made:  Yes Patient understands diagnosis:  Yes  As evidenced by asking for help with depression and psychosis Discussing patient identified problems/goals with staff:  Yes  See below Medical problems stabilized or resolved:  Yes Denies suicidal/homicidal ideation: Yes  In tx team Issues/concerns per patient self-inventory:  Yes   Other:  New problem(s) identified: N/A  Reason for Continuation of Hospitalization: Medication stabilization Withdrawal symptoms  Interventions implemented related to continuation of hospitalization:  Librium taper,  Risperdal trial, Encourage group attendance and participation  Additional comments:  Estimated length of stay: 1-2 days  Discharge Plan: Return to son's home, follow up outpt  New goal(s): N/A  Review of initial/current patient goals per problem list:   1.  Goal(s): Eliminate SI  Met:  Yes  Target date:/0/7  As evidenced [id number] report  2.  Goal (s): Decrease depression  Met:  Yes  Target date:10/7  As evidenced by: Orbie Hurst rates her depression at a 2  3.  Goal(s): Safely detox from alcohol  Met:  No  Target date:10/8  As evidenced [id number] vitals, no withdrawal symptoms  4.  Goal(s): Eliminate psychosis  Met:  Yes  Target date:10/7  As evidenced [id number] report  Attendees: Patient:  Brenda [name] 12/28/2011 3:36 PM  Family:     Physician:  Hampton Abbot 12/28/2011 3:36 PM   Nursing:  Flint Melter  12/28/2011 3:36 PM   Case Manager:  Ripley Fraise,  12/28/2011 3:36 PM   Counselor:  Leandrew Koyanagi 12/28/2011 3:36 PM   Other:  Nena Polio  PA 12/28/2011 3:36 PM  Other:     Other:     Other:      Scribe for Treatment Team:   Trish Mage,  12/28/2011 3:36 PM

## 2011-12-29 DIAGNOSIS — F10951 Alcohol use, unspecified with alcohol-induced psychotic disorder with hallucinations: Secondary | ICD-10-CM

## 2011-12-29 MED ORDER — TRAZODONE HCL 50 MG PO TABS
50.0000 mg | ORAL_TABLET | Freq: Every evening | ORAL | Status: DC | PRN
  Filled 2011-12-29: qty 14

## 2011-12-29 MED ORDER — RISPERIDONE 1 MG PO TBDP
1.0000 mg | ORAL_TABLET | Freq: Every day | ORAL | Status: DC
  Filled 2011-12-29: qty 14

## 2011-12-29 NOTE — [facility] Suicide Risk Assessment (Signed)
Suicide Risk Assessment  Discharge Assessment     Demographic Factors:  Low socioeconomic status and Unemployed  Mental Status Per Nursing Assessment::   On Admission:  NA  Current Mental Status by Physician: NA Patient is alert and oriented times 3 Patient reports mood as good, affect is bright and full Thought content has no suicidal, homicidal ideation. Patient denies any paranoia or delusions Thought processes are goal directed Insight and judgement fluctuates between fair to poor in regards to the alcohol abuse.   Loss Factors: Financial problems/change in socioeconomic status  Historical Factors:Family history of mental illness or substance abuse, 1 SI attempt in past    Risk Reduction Factors:Living with another person, especially a relative;Positive social support;Sense of responsibility to family      Continued Clinical Symptoms:  Alcohol/Substance Abuse/Dependencies Previous Psychiatric Diagnoses and Treatments  Cognitive Features That Contribute To Risk:  None currently  Suicide Risk:  Minimal: No identifiable suicidal ideation.  Patients presenting with no risk factors but with morbid ruminations; may be classified as minimal risk based on the severity of the depressive symptoms  Discharge Diagnoses:   AXIS I:  Alcohol Abuse and Substance Induced Mood Disorder AXIS II:  Deferred AXIS III:   Past Medical History  Diagnosis Date  . Mental disorder   . Depression    AXIS IV:  problems related to social environment AXIS V:  51-60 moderate symptoms  Plan Of Care/Follow-up recommendations:  Activity:  As tolerated Diet:  Regular Other:  To regularly with outpatient provider and attend 79 AA meetings in 90 days  Is patient on multiple antipsychotic therapies at discharge:  No   Has Patient had three or more failed trials of antipsychotic monotherapy by history:  No  Recommended Plan for Multiple Antipsychotic  Therapies:N/A   [place] 12/29/2011, 12:00 PM
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
## 2011-12-29 NOTE — Discharge Summary (Signed)
Agree with the assessment

## 2011-12-29 NOTE — Progress Notes (Signed)
Patient discharged home per MD order.  Patient denies SI/HI/AVH.  She decided to use a bus pass to get to her son's house.  She received her prescriptions, med samples and belongings.  Her medications were reviewed with her and the importance of not drinking while taking them.  She left ambulatory for the bus stop.

## 2011-12-29 NOTE — Progress Notes (Signed)
Patient ID: Brenda [name], female   DOB: 1960/05/22, 51 y.o.   MRN: [id number] She has been up and to groups interacting with peers and staff.  She denies SI thoughts. Says that she is going to her sons home when discharged.

## 2011-12-30 NOTE — Progress Notes (Signed)
[facility] Group Notes:  (Counselor/Nursing/MHT/Case Management/Adjunct)  12/30/2011 1:25 PM  Type of Therapy:  Group Therapy  12/29/11  Participation Level:  Active  Participation Quality:  Attentive and Sharing  Affect:  Appropriate  Cognitive:  Oriented  Insight:  Limited  Engagement in Group:  Good  Engagement in Therapy:  Good  Modes of Intervention:  Clarification, Education, Problem-solving and Support  Summary of Progress/Problems: Patient was more logical in conversation today. Talked about moving down here and only having family. Counselor suggested establishing other supports outside of family.    Laruen Risser 12/30/2011, 1:25 PM

## 2012-02-25 ENCOUNTER — Encounter (HOSPITAL_COMMUNITY): Payer: Self-pay | Admitting: *Deleted

## 2012-02-25 ENCOUNTER — Emergency Department (HOSPITAL_COMMUNITY)
Admission: EM | Admit: 2012-02-25 | Discharge: 2012-02-26 | Disposition: A | Discharge status: Home / Self Care | Payer: Self-pay | Attending: Emergency Medicine | Admitting: Emergency Medicine

## 2012-02-25 DIAGNOSIS — F17200 Nicotine dependence, unspecified, uncomplicated: Secondary | ICD-10-CM | POA: Insufficient documentation

## 2012-02-25 DIAGNOSIS — F3289 Other specified depressive episodes: Secondary | ICD-10-CM | POA: Insufficient documentation

## 2012-02-25 DIAGNOSIS — F329 Major depressive disorder, single episode, unspecified: Secondary | ICD-10-CM | POA: Insufficient documentation

## 2012-02-25 DIAGNOSIS — M549 Dorsalgia, unspecified: Secondary | ICD-10-CM | POA: Insufficient documentation

## 2012-02-25 DIAGNOSIS — K439 Ventral hernia without obstruction or gangrene: Secondary | ICD-10-CM | POA: Insufficient documentation

## 2012-02-25 LAB — COMPREHENSIVE METABOLIC PANEL
BUN: 11 mg/dL (ref 6–23)
CO2: 26 mEq/L (ref 19–32)
Calcium: 9.7 mg/dL (ref 8.4–10.5)
Creatinine, Ser: 0.67 mg/dL (ref 0.50–1.10)
GFR calc Af Amer: >90 mL/min (ref >90)
GFR calc non Af Amer: >90 mL/min (ref >90)
Glucose, Bld: 88 mg/dL (ref 70–99)
Total Bilirubin: 0.2 mg/dL — ABNORMAL LOW (ref 0.3–1.2)

## 2012-02-25 LAB — URINALYSIS, ROUTINE W REFLEX MICROSCOPIC
Ketones, ur: NEGATIVE mg/dL
Protein, ur: NEGATIVE mg/dL
Urobilinogen, UA: 0.2 mg/dL (ref 0.0–1.0)

## 2012-02-25 LAB — CBC WITH DIFFERENTIAL/PLATELET
Basophils Absolute: 0 10*3/uL (ref 0.0–0.1)
Eosinophils Relative: 0 % (ref 0–5)
HCT: 40.7 % (ref 36.0–46.0)
Hemoglobin: 13.8 g/dL (ref 12.0–15.0)
Lymphocytes Relative: 53 % — ABNORMAL HIGH (ref 12–46)
Lymphs Abs: 2.8 10*3/uL (ref 0.7–4.0)
MCV: 95.8 fL (ref 78.0–100.0)
Monocytes Absolute: 0.5 10*3/uL (ref 0.1–1.0)
Monocytes Relative: 9 % (ref 3–12)
RDW: 14.7 % (ref 11.5–15.5)
WBC: 5.3 10*3/uL (ref 4.0–10.5)

## 2012-02-25 LAB — URINE MICROSCOPIC-ADD ON

## 2012-02-25 LAB — LIPASE, BLOOD: Lipase: 32 U/L (ref 11–59)

## 2012-02-25 NOTE — ED Notes (Signed)
Pt called for lab draw. Pt did not respond

## 2012-02-25 NOTE — ED Notes (Signed)
Pt c/o abd and back pain that started on Sunday. Denies n/v, fever or chills. Pt c/o lower abd pain and lower back pain.

## 2012-02-26 MED ORDER — KETOROLAC TROMETHAMINE 60 MG/2ML IM SOLN
60.0000 mg | Freq: Once | INTRAMUSCULAR | Status: AC
  Administered 2012-02-26: 60 mg via INTRAMUSCULAR
  Filled 2012-02-26: qty 2

## 2012-02-26 MED ORDER — NAPROXEN 500 MG PO TABS: 500.0000 mg | ORAL_TABLET | Freq: Two times a day (BID) | ORAL | Status: DC

## 2012-02-26 MED ORDER — OXYCODONE-ACETAMINOPHEN 5-325 MG PO TABS: 1.0000 | ORAL_TABLET | ORAL | Status: DC | PRN

## 2012-02-26 NOTE — ED Provider Notes (Signed)
History     CSN: [id number]  Arrival date & time 02/25/12  1729   First MD Initiated Contact with Patient 02/26/12 0053      Chief Complaint  Patient presents with  . Abdominal Pain  . Back Pain    (Consider location/radiation/quality/duration/timing/severity/associated sxs/prior treatment) HPI Comments: 51 year old female presents with a complaint of mid abdominal pain, this has been intermittent since Sunday, she denies any nausea vomiting fevers or chills, denies any radiation of the pain and states that it is associated with a mass just above her bellybutton. She denies any history of abdominal surgery other than a tubal ligation. The symptoms are mild to moderate, persistent, nothing makes better or worse. She denies any straining, significant coughing or heavy lifting. Specifically the patient denies constipation, diarrhea or dysuria.  Patient is a 51 y.o. female presenting with abdominal pain and back pain. The history is provided by the patient.  Abdominal Pain The primary symptoms of the illness include abdominal pain.  Additional symptoms associated with the illness include back pain.  Back Pain  Associated symptoms include abdominal pain.    Past Medical History  Diagnosis Date  . Mental disorder   . Depression     Past Surgical History  Procedure Date  . Tubal ligation     History reviewed. No pertinent family history.  History  Substance Use Topics  . Smoking status: Current Every Day Smoker -- 0.5 packs/day for 30 years    Types: Cigarettes  . Smokeless tobacco: Not on file  . Alcohol Use: Yes     Comment: on the weekends she has a beer.     OB History    Grav Para Term Preterm Abortions TAB SAB Ect Mult Living                  Review of Systems  Gastrointestinal: Positive for abdominal pain.  Musculoskeletal: Positive for back pain.  All other systems reviewed and are negative.    Allergies  Aspirin adult low  Home Medications   Current  Outpatient Rx  Name  Route  Sig  Dispense  Refill  . ADULT MULTIVITAMIN W/MINERALS CH   Oral   Take 1 tablet by mouth daily.         Marland Kitchen NAPROXEN 500 MG PO TABS   Oral   Take 1 tablet (500 mg total) by mouth 2 (two) times daily with a meal.   30 tablet   0   . OXYCODONE-ACETAMINOPHEN 5-325 MG PO TABS   Oral   Take 1 tablet by mouth every 4 (four) hours as needed for pain.   20 tablet   0     BP 136/88  Pulse 93  Temp 98.4 F (36.9 C) (Oral)  Resp 20  SpO2 99%  LMP 01/19/2011  Physical Exam  Nursing note and vitals reviewed. Constitutional: She appears well-developed and well-nourished. No distress.  HENT:  Head: Normocephalic and atraumatic.  Mouth/Throat: Oropharynx is clear and moist. No oropharyngeal exudate.  Eyes: Conjunctivae normal and EOM are normal. Pupils are equal, round, and reactive to light. Right eye exhibits no discharge. Left eye exhibits no discharge. No scleral icterus.  Neck: Normal range of motion. Neck supple. No JVD present. No thyromegaly present.  Cardiovascular: Normal rate, regular rhythm, normal heart sounds and intact distal pulses.  Exam reveals no gallop and no friction rub.   No murmur heard. Pulmonary/Chest: Effort normal and breath sounds normal. No respiratory distress. She has no wheezes. She  has no rales.  Abdominal: Soft. Bowel sounds are normal. She exhibits mass ( Supraumbilical midline hernia palpated, reduced with manual pressure on exam). She exhibits no distension. There is no tenderness.       The abdomen is soft and nontender except over the hernia  Musculoskeletal: Normal range of motion. She exhibits no edema and no tenderness.  Lymphadenopathy:    She has no cervical adenopathy.  Neurological: She is alert. Coordination normal.  Skin: Skin is warm and dry. No rash noted. No erythema.  Psychiatric: She has a normal mood and affect. Her behavior is normal.    ED Course  Hernia reduction Date/Time: 02/26/2012 1:00  AM Performed by: Noemi Chapel D Authorized by: Noemi Chapel D Consent: Verbal consent obtained. Risks and benefits: risks, benefits and alternatives were discussed Consent given by: patient Patient understanding: patient states understanding of the procedure being performed Patient consent: the patient's understanding of the procedure matches consent given Procedure consent: procedure consent matches procedure scheduled Required items: required blood products, implants, devices, and special equipment available Patient identity confirmed: verbally with patient Time out: Immediately prior to procedure a "time out" was called to verify the correct patient, procedure, equipment, support staff and site/side marked as required. Local anesthesia used: no Patient sedated: no Patient tolerance: Patient tolerated the procedure well with no immediate complications. Comments: Manual reduction with succesful reduction   (including critical care time)  Labs Reviewed  CBC WITH DIFFERENTIAL - Abnormal; Notable for the following:    Neutrophils Relative 37 (*)     Lymphocytes Relative 53 (*)     All other components within normal limits  COMPREHENSIVE METABOLIC PANEL - Abnormal; Notable for the following:    Total Bilirubin 0.2 (*)     All other components within normal limits  URINALYSIS, ROUTINE W REFLEX MICROSCOPIC - Abnormal; Notable for the following:    Nitrite POSITIVE (*)     Leukocytes, UA TRACE (*)     All other components within normal limits  URINE MICROSCOPIC-ADD ON - Abnormal; Notable for the following:    Squamous Epithelial / LPF FEW (*)     Bacteria, UA MANY (*)     All other components within normal limits  LIPASE, BLOOD  URINE CULTURE  URINE CULTURE   No results found.   1. Abdominal wall hernia       MDM  Hernia has been reduced, see reduction note, patient appears stable, no signs of acidosis, no signs of peritonitis, vital signs are normal and labs show no  leukocytosis, no anion gap, urinalysis with many bacteria and 3-6 white blood cells. She does not have any urinary symptoms, she does not have a fever, urinary culture ordered.      Percocet, naprosyn, Gen Surgery f/u.  Johnna Acosta, MD 02/26/12 [phone]

## 2012-02-26 NOTE — ED Notes (Signed)
[id number] Expected date:02/26/12<BR> Expected time:12:34 AM<BR> Means of arrival:<BR> Comments:<BR> For triage 6

## 2012-02-26 NOTE — ED Notes (Signed)
Patient is alert and oriented x3.  She was given DC instructions and follow up visit instructions.  Patient gave verbal understanding. She was DC ambulatory under her own power to home.  V/S stable.  He was not showing any signs of distress on DC 

## 2012-02-27 LAB — URINE CULTURE: Colony Count: >=100000

## 2012-02-28 NOTE — ED Notes (Signed)
+   Urine Chart sent to [facility] office for review. 

## 2012-03-04 ENCOUNTER — Telehealth (HOSPITAL_COMMUNITY): Payer: Self-pay | Admitting: Emergency Medicine

## 2012-03-05 NOTE — ED Notes (Signed)
Unable to contact patient via phone. Sent letter. °

## 2012-03-05 NOTE — ED Notes (Signed)
Chart returned from [place] office. Prescribed Macrobid 100 mg PO BID x 5 days. Qty #10. Prescribed by Heron Nay PA-C.

## 2012-03-27 NOTE — ED Notes (Signed)
Unable to contact patient via phone or letter. Chart sent to Medical Records.

## 2012-05-25 ENCOUNTER — Emergency Department (HOSPITAL_COMMUNITY): Payer: Self-pay

## 2012-05-25 ENCOUNTER — Encounter (HOSPITAL_COMMUNITY): Payer: Self-pay

## 2012-05-25 ENCOUNTER — Emergency Department (HOSPITAL_COMMUNITY)
Admission: EM | Admit: 2012-05-25 | Discharge: 2012-05-25 | Disposition: A | Discharge status: Home / Self Care | Payer: Self-pay | Attending: Emergency Medicine | Admitting: Emergency Medicine

## 2012-05-25 DIAGNOSIS — H60399 Other infective otitis externa, unspecified ear: Secondary | ICD-10-CM | POA: Insufficient documentation

## 2012-05-25 DIAGNOSIS — J40 Bronchitis, not specified as acute or chronic: Secondary | ICD-10-CM

## 2012-05-25 DIAGNOSIS — R059 Cough, unspecified: Secondary | ICD-10-CM | POA: Insufficient documentation

## 2012-05-25 DIAGNOSIS — F17200 Nicotine dependence, unspecified, uncomplicated: Secondary | ICD-10-CM | POA: Insufficient documentation

## 2012-05-25 DIAGNOSIS — K529 Noninfective gastroenteritis and colitis, unspecified: Secondary | ICD-10-CM

## 2012-05-25 DIAGNOSIS — Z9851 Tubal ligation status: Secondary | ICD-10-CM | POA: Insufficient documentation

## 2012-05-25 DIAGNOSIS — N390 Urinary tract infection, site not specified: Secondary | ICD-10-CM

## 2012-05-25 DIAGNOSIS — J209 Acute bronchitis, unspecified: Secondary | ICD-10-CM | POA: Insufficient documentation

## 2012-05-25 DIAGNOSIS — R05 Cough: Secondary | ICD-10-CM | POA: Insufficient documentation

## 2012-05-25 DIAGNOSIS — K5289 Other specified noninfective gastroenteritis and colitis: Secondary | ICD-10-CM | POA: Insufficient documentation

## 2012-05-25 DIAGNOSIS — H6091 Unspecified otitis externa, right ear: Secondary | ICD-10-CM

## 2012-05-25 DIAGNOSIS — Z8659 Personal history of other mental and behavioral disorders: Secondary | ICD-10-CM | POA: Insufficient documentation

## 2012-05-25 LAB — COMPREHENSIVE METABOLIC PANEL
ALT: 12 U/L (ref 0–35)
AST: 21 U/L (ref 0–37)
Albumin: 3.9 g/dL (ref 3.5–5.2)
Alkaline Phosphatase: 107 U/L (ref 39–117)
BUN: 7 mg/dL (ref 6–23)
CO2: 19 mEq/L (ref 19–32)
Calcium: 8.9 mg/dL (ref 8.4–10.5)
Chloride: 89 mEq/L — ABNORMAL LOW (ref 96–112)
Creatinine, Ser: 0.54 mg/dL (ref 0.50–1.10)
GFR calc Af Amer: >90 mL/min (ref >90)
GFR calc non Af Amer: >90 mL/min (ref >90)
Glucose, Bld: 83 mg/dL (ref 70–99)
Potassium: 3.3 mEq/L — ABNORMAL LOW (ref 3.5–5.1)
Sodium: 131 mEq/L — ABNORMAL LOW (ref 135–145)
Total Bilirubin: 0.2 mg/dL — ABNORMAL LOW (ref 0.3–1.2)
Total Protein: 8.3 g/dL (ref 6.0–8.3)

## 2012-05-25 LAB — CBC WITH DIFFERENTIAL/PLATELET
Basophils Absolute: 0 10*3/uL (ref 0.0–0.1)
Basophils Relative: 0 % (ref 0–1)
Eosinophils Absolute: 0 10*3/uL (ref 0.0–0.7)
Eosinophils Relative: 0 % (ref 0–5)
HCT: 39.3 % (ref 36.0–46.0)
Hemoglobin: 13.8 g/dL (ref 12.0–15.0)
Lymphocytes Relative: 24 % (ref 12–46)
Lymphs Abs: 2.1 10*3/uL (ref 0.7–4.0)
MCH: 32.1 pg (ref 26.0–34.0)
MCHC: 35.1 g/dL (ref 30.0–36.0)
MCV: 91.4 fL (ref 78.0–100.0)
Monocytes Absolute: 0.7 10*3/uL (ref 0.1–1.0)
Monocytes Relative: 8 % (ref 3–12)
Neutro Abs: 5.9 10*3/uL (ref 1.7–7.7)
Neutrophils Relative %: 68 % (ref 43–77)
Platelets: 306 10*3/uL (ref 150–400)
RBC: 4.3 MIL/uL (ref 3.87–5.11)
RDW: 13.3 % (ref 11.5–15.5)
WBC: 8.7 10*3/uL (ref 4.0–10.5)

## 2012-05-25 LAB — URINALYSIS, ROUTINE W REFLEX MICROSCOPIC
Bilirubin Urine: NEGATIVE
Glucose, UA: NEGATIVE mg/dL
Hgb urine dipstick: NEGATIVE
Leukocytes, UA: NEGATIVE
Nitrite: POSITIVE — AB
Protein, ur: NEGATIVE mg/dL
Specific Gravity, Urine: 1.008 (ref 1.005–1.030)
Urobilinogen, UA: 0.2 mg/dL (ref 0.0–1.0)
pH: 5.5 (ref 5.0–8.0)

## 2012-05-25 LAB — URINE MICROSCOPIC-ADD ON

## 2012-05-25 LAB — LIPASE, BLOOD: Lipase: 18 U/L (ref 11–59)

## 2012-05-25 IMAGING — CR DG CHEST 2V
2 series · 2 of 2 positions shown · non-contrast
Comparison: [DATE]

CLINICAL DATA: Cough, vomiting

CHEST - 2 VIEW

[w chest pa]
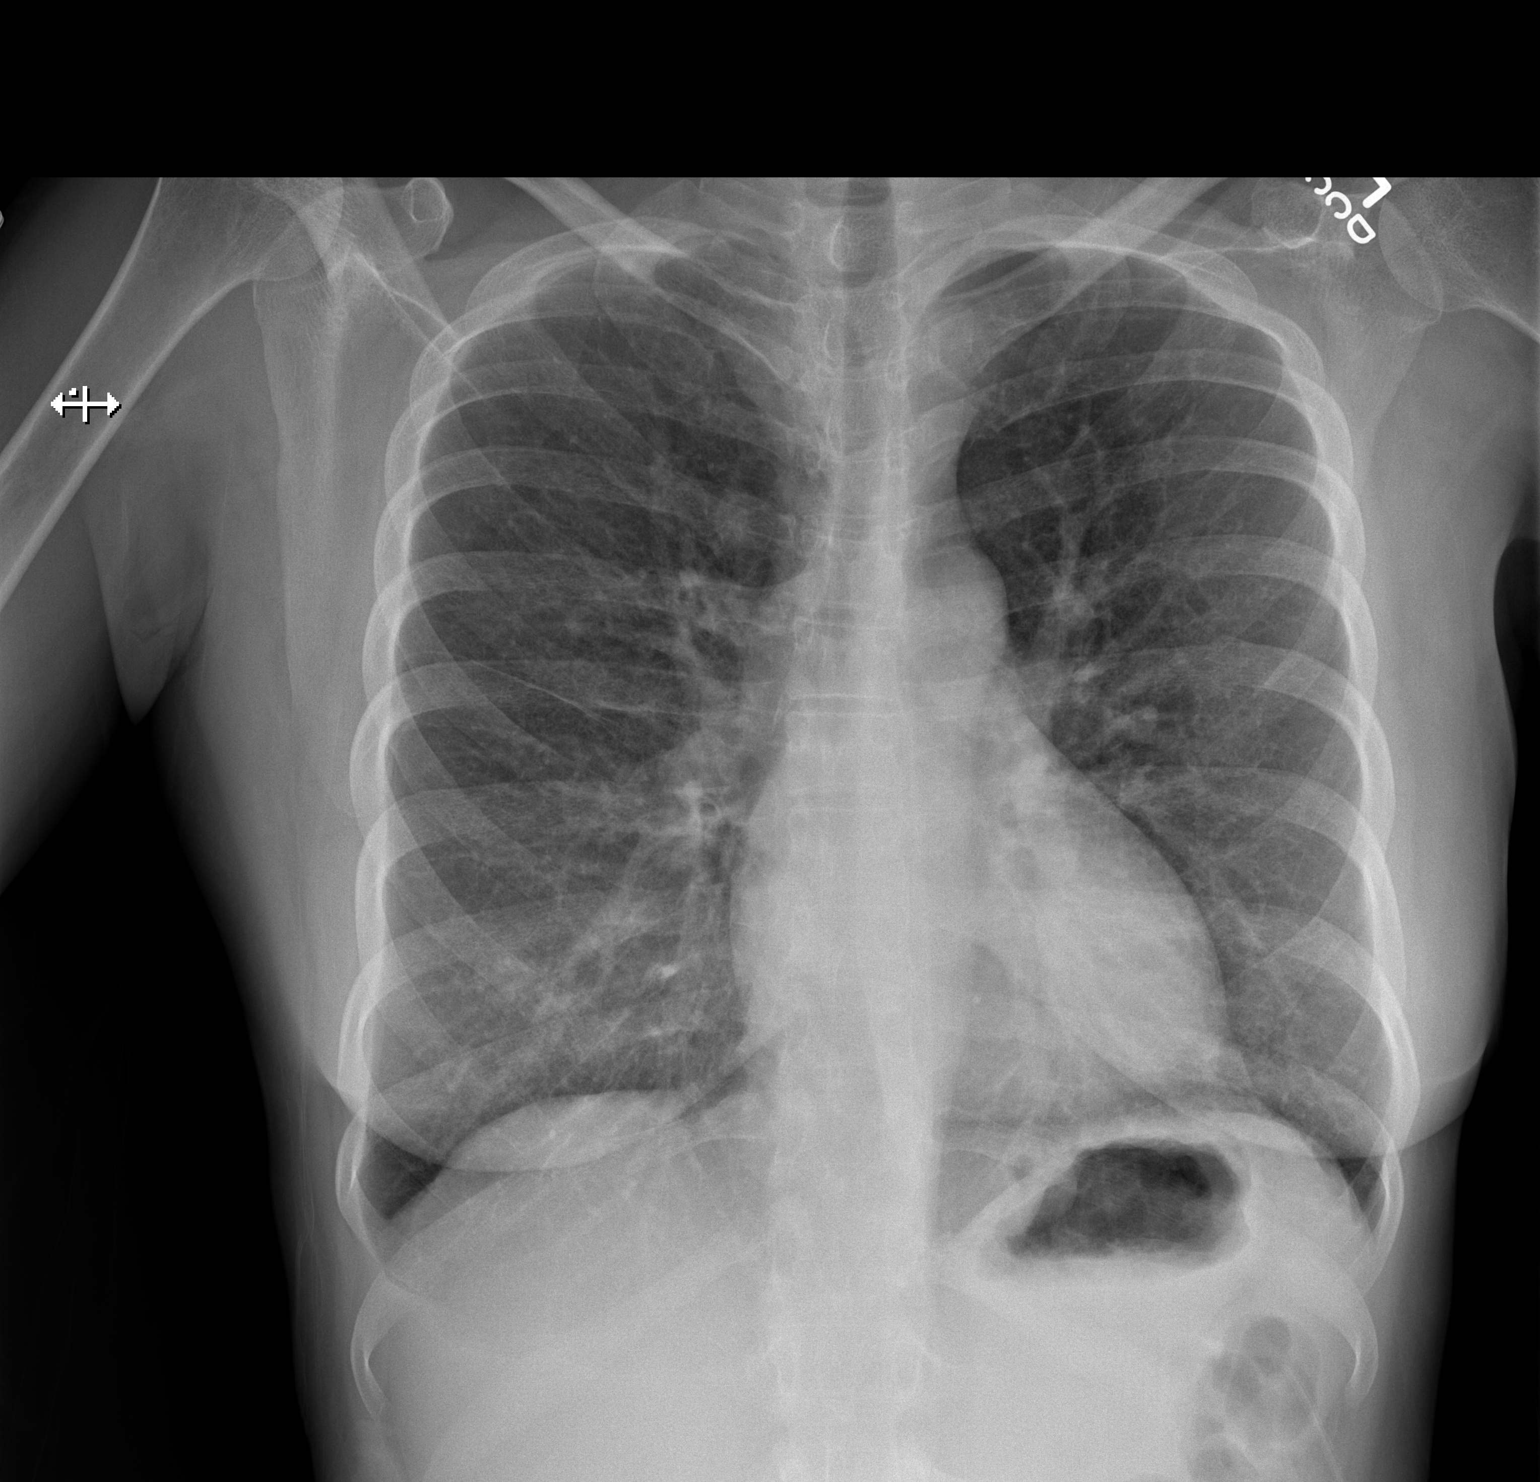

[w chest lat]
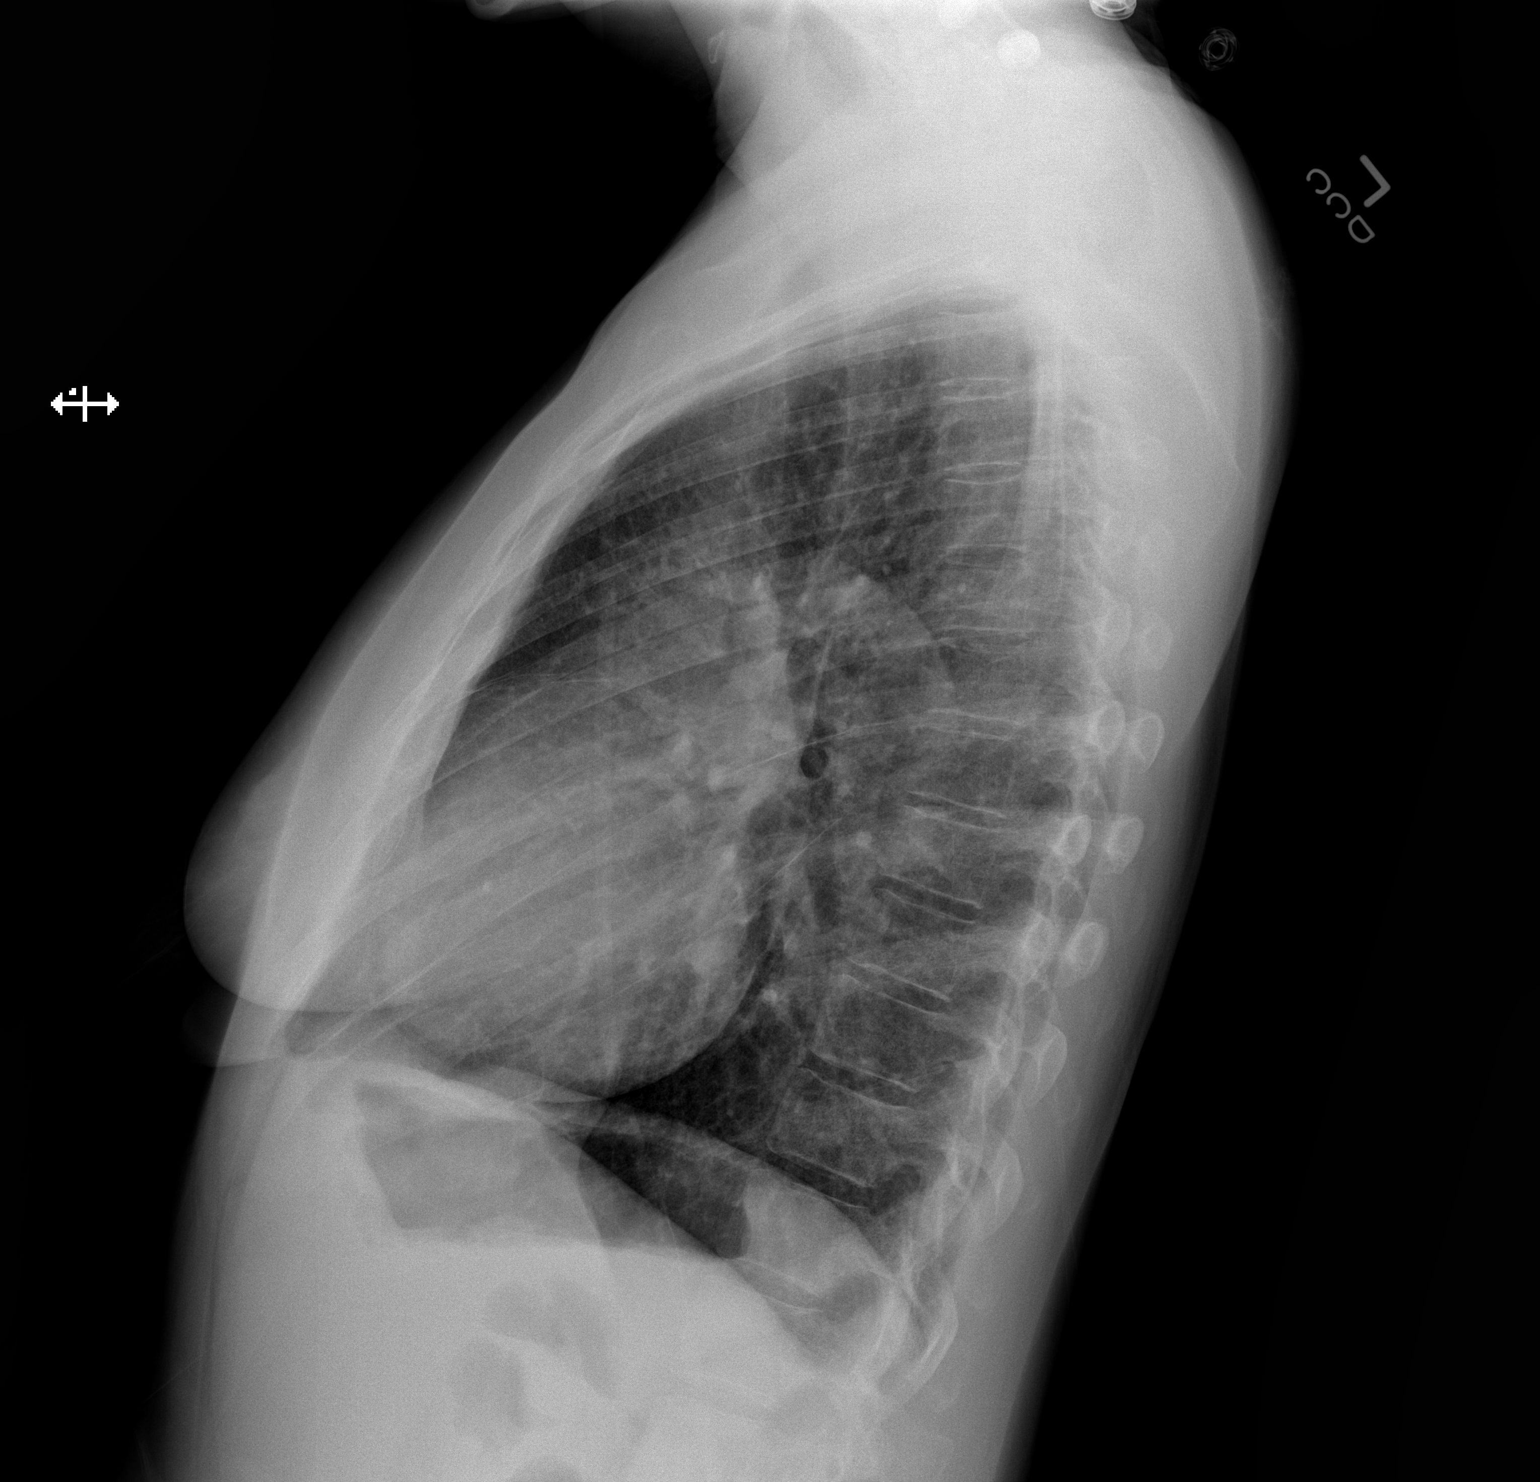

[2 of 2 positions shown; findings below may reference images not displayed]

FINDINGS: Mild perihilar and bibasilar interstitial prominence as
before.  No confluent airspace consolidation.  No effusion.
Regional bones unremarkable. Heart size upper limits normal.
IMPRESSION: 1.  Chronic interstitial prominence.  No acute/superimposed
abnormality.

## 2012-05-25 MED ORDER — ALBUTEROL SULFATE HFA 108 (90 BASE) MCG/ACT IN AERS
2.0000 | INHALATION_SPRAY | Freq: Four times a day (QID) | RESPIRATORY_TRACT | Status: DC
  Administered 2012-05-25: 2 via RESPIRATORY_TRACT
  Filled 2012-05-25: qty 6.7

## 2012-05-25 MED ORDER — ALBUTEROL SULFATE (5 MG/ML) 0.5% IN NEBU
5.0000 mg | INHALATION_SOLUTION | Freq: Once | RESPIRATORY_TRACT | Status: AC
  Administered 2012-05-25: 5 mg via RESPIRATORY_TRACT
  Filled 2012-05-25: qty 1

## 2012-05-25 MED ORDER — CIPROFLOXACIN-DEXAMETHASONE 0.3-0.1 % OT SUSP: 4.0000 [drp] | Freq: Two times a day (BID) | OTIC | Status: DC

## 2012-05-25 MED ORDER — SULFAMETHOXAZOLE-TMP DS 800-160 MG PO TABS
1.0000 | ORAL_TABLET | Freq: Once | ORAL | Status: AC
  Administered 2012-05-25: 1 via ORAL
  Filled 2012-05-25: qty 1

## 2012-05-25 MED ORDER — IPRATROPIUM BROMIDE 0.02 % IN SOLN
0.5000 mg | RESPIRATORY_TRACT | Status: AC
  Administered 2012-05-25: 0.5 mg via RESPIRATORY_TRACT
  Filled 2012-05-25: qty 2.5

## 2012-05-25 MED ORDER — ONDANSETRON HCL 4 MG/2ML IJ SOLN
4.0000 mg | Freq: Once | INTRAMUSCULAR | Status: AC
  Administered 2012-05-25: 4 mg via INTRAVENOUS
  Filled 2012-05-25: qty 2

## 2012-05-25 MED ORDER — PREDNISONE 50 MG PO TABS: 50.0000 mg | ORAL_TABLET | Freq: Every day | ORAL | Status: DC

## 2012-05-25 MED ORDER — SULFAMETHOXAZOLE-TRIMETHOPRIM 800-160 MG PO TABS: 1.0000 | ORAL_TABLET | Freq: Two times a day (BID) | ORAL | Status: DC

## 2012-05-25 MED ORDER — MORPHINE SULFATE 4 MG/ML IJ SOLN
4.0000 mg | Freq: Once | INTRAMUSCULAR | Status: AC
  Administered 2012-05-25: 4 mg via INTRAVENOUS
  Filled 2012-05-25: qty 1

## 2012-05-25 MED ORDER — PROMETHAZINE HCL 25 MG PO TABS: 25.0000 mg | ORAL_TABLET | Freq: Four times a day (QID) | ORAL | Status: DC | PRN

## 2012-05-25 MED ORDER — POTASSIUM CHLORIDE CRYS ER 20 MEQ PO TBCR
40.0000 meq | EXTENDED_RELEASE_TABLET | Freq: Once | ORAL | Status: AC
  Administered 2012-05-25: 40 meq via ORAL
  Filled 2012-05-25: qty 2

## 2012-05-25 MED ORDER — SODIUM CHLORIDE 0.9 % IV BOLUS (SEPSIS)
1000.0000 mL | Freq: Once | INTRAVENOUS | Status: AC
  Administered 2012-05-25: 1000 mL via INTRAVENOUS

## 2012-05-25 MED ORDER — KETOROLAC TROMETHAMINE 30 MG/ML IJ SOLN
30.0000 mg | Freq: Once | INTRAMUSCULAR | Status: DC
  Filled 2012-05-25: qty 1

## 2012-05-25 NOTE — ED Notes (Signed)
Pt able to drink water with PO potassium at this time

## 2012-05-25 NOTE — ED Notes (Signed)
[id number] Expected date:<BR> Expected time:<BR> Means of arrival:<BR> Comments:<BR> dizzy

## 2012-05-25 NOTE — ED Provider Notes (Signed)
Medical screening examination/treatment/procedure(s) were performed by non-physician practitioner and as supervising physician I was immediately available for consultation/collaboration.   Kathalene Frames, MD 05/25/12 2016

## 2012-05-25 NOTE — ED Provider Notes (Addendum)
History     CSN: [id number]  Arrival date & time 05/25/12  9   First MD Initiated Contact with Patient 05/25/12 1747      Chief Complaint  Patient presents with  . Nausea  . Emesis    (Consider location/radiation/quality/duration/timing/severity/associated sxs/prior treatment) HPI Patient presents to the emergency department with nausea, vomiting, and cough for the last 8 hours.  Patient, states, that she is a smoker.  Patient, states, that she's not had chest pain, diarrhea, back pain, fever, dizziness, syncope, bloody stool, nasal congestion, or sore throat.  Patient, states she did not take anything prior to arrival for symptoms.  Patient, states nothing seems to make her condition, better or worse Past Medical History  Diagnosis Date  . Mental disorder   . Depression     Past Surgical History  Procedure Laterality Date  . Tubal ligation      History reviewed. No pertinent family history.  History  Substance Use Topics  . Smoking status: Current Every Day Smoker -- 0.50 packs/day for 30 years    Types: Cigarettes  . Smokeless tobacco: Not on file  . Alcohol Use: Yes     Comment: on the weekends she has a beer.     OB History   Grav Para Term Preterm Abortions TAB SAB Ect Mult Living                  Review of Systems All other systems negative except as documented in the HPI. All pertinent positives and negatives as reviewed in the HPI. Allergies  Aspirin adult low  Home Medications  No current outpatient prescriptions on file.  BP 146/77  Pulse 92  Temp(Src) 98.3 F (36.8 C)  Resp 20  SpO2 98%  LMP 01/19/2011  Physical Exam  Constitutional: She is oriented to person, place, and time. She appears well-developed and well-nourished. No distress.  HENT:  Head: Normocephalic and atraumatic.  Mouth/Throat: Oropharynx is clear and moist. No oropharyngeal exudate.  Eyes: Pupils are equal, round, and reactive to light.  Neck: Normal range of motion.  Neck supple.  Cardiovascular: Normal rate, regular rhythm and normal heart sounds.  Exam reveals no gallop and no friction rub.   No murmur heard. Pulmonary/Chest: Effort normal. She has wheezes. She has no rales.  Abdominal: Soft. Bowel sounds are normal. She exhibits no distension. There is no tenderness. There is no guarding.  Lymphadenopathy:    She has no cervical adenopathy.  Neurological: She is alert and oriented to person, place, and time.  Skin: Skin is warm and dry. No rash noted. She is not diaphoretic.    ED Course  Procedures (including critical care time)  Labs Reviewed  COMPREHENSIVE METABOLIC PANEL - Abnormal; Notable for the following:    Sodium 131 (*)    Potassium 3.3 (*)    Chloride 89 (*)    Total Bilirubin 0.2 (*)    All other components within normal limits  CBC WITH DIFFERENTIAL  LIPASE, BLOOD  URINALYSIS, ROUTINE W REFLEX MICROSCOPIC   Dg Chest 2 View  05/25/2012  *RADIOLOGY REPORT*  Clinical Data: Cough, vomiting  CHEST - 2 VIEW  Comparison: 02/02/2011  Findings: Mild perihilar and bibasilar interstitial prominence as before.  No confluent airspace consolidation.  No effusion. Regional bones unremarkable. Heart size upper limits normal.  IMPRESSION:  1.  Chronic interstitial prominence.  No acute/superimposed abnormality.   Original Report Authenticated By: D. Wallace Going, MD    Patient does have some wheezing.  On exam.  She is a chronic smoker.  Patient will be treated with IV fluids and antiemetics.  Patient most likely has a viral GI illness, and possibly an upper respiratory tract infection as well.    Patient is feeling better at this time.  Tolerated oral fluids.  Patient will be discharged home with anti-emetics and treatment for bronchitis.  Patient is advised to slowly increase her fluid intake. MDM  MDM Reviewed: vitals, nursing note and previous chart Reviewed previous: labs Interpretation: labs and x-ray           Brent General, PA-C 05/25/12 2012  Brent General, PA-C 05/25/12 2022

## 2012-05-25 NOTE — ED Notes (Signed)
Pt c/o n/v and headache.  Denies diarrhea.  Pain score 10/10.  Pt, also, R ear pain x 3 days.  Pain score 10/10.
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
## 2015-01-14 ENCOUNTER — Emergency Department (HOSPITAL_COMMUNITY): Payer: No Typology Code available for payment source

## 2015-01-14 ENCOUNTER — Emergency Department (HOSPITAL_COMMUNITY)
Admission: EM | Admit: 2015-01-14 | Discharge: 2015-01-14 | Disposition: A | Discharge status: Home / Self Care | Payer: No Typology Code available for payment source | Attending: Emergency Medicine | Admitting: Emergency Medicine

## 2015-01-14 ENCOUNTER — Encounter (HOSPITAL_COMMUNITY): Payer: Self-pay | Admitting: Cardiology

## 2015-01-14 DIAGNOSIS — Y92410 Unspecified street and highway as the place of occurrence of the external cause: Secondary | ICD-10-CM | POA: Insufficient documentation

## 2015-01-14 DIAGNOSIS — S62634A Displaced fracture of distal phalanx of right ring finger, initial encounter for closed fracture: Secondary | ICD-10-CM

## 2015-01-14 DIAGNOSIS — Z720 Tobacco use: Secondary | ICD-10-CM | POA: Insufficient documentation

## 2015-01-14 DIAGNOSIS — Y9389 Activity, other specified: Secondary | ICD-10-CM | POA: Diagnosis not present

## 2015-01-14 DIAGNOSIS — Y998 Other external cause status: Secondary | ICD-10-CM | POA: Diagnosis not present

## 2015-01-14 DIAGNOSIS — Z792 Long term (current) use of antibiotics: Secondary | ICD-10-CM | POA: Diagnosis not present

## 2015-01-14 DIAGNOSIS — Z7952 Long term (current) use of systemic steroids: Secondary | ICD-10-CM | POA: Diagnosis not present

## 2015-01-14 DIAGNOSIS — S20311A Abrasion of right front wall of thorax, initial encounter: Secondary | ICD-10-CM | POA: Insufficient documentation

## 2015-01-14 DIAGNOSIS — S6991XA Unspecified injury of right wrist, hand and finger(s), initial encounter: Secondary | ICD-10-CM | POA: Diagnosis present

## 2015-01-14 DIAGNOSIS — Z8659 Personal history of other mental and behavioral disorders: Secondary | ICD-10-CM | POA: Insufficient documentation

## 2015-01-14 DIAGNOSIS — S161XXA Strain of muscle, fascia and tendon at neck level, initial encounter: Secondary | ICD-10-CM

## 2015-01-14 DIAGNOSIS — S6992XA Unspecified injury of left wrist, hand and finger(s), initial encounter: Secondary | ICD-10-CM | POA: Diagnosis not present
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
## 2015-01-14 MED ORDER — NAPROXEN 500 MG PO TABS: 500.0000 mg | ORAL_TABLET | Freq: Two times a day (BID) | ORAL | Status: DC

## 2015-01-14 MED ORDER — HYDROCODONE-ACETAMINOPHEN 5-325 MG PO TABS
2.0000 | ORAL_TABLET | Freq: Once | ORAL | Status: AC
  Administered 2015-01-14: 2 via ORAL
  Filled 2015-01-14: qty 2

## 2015-01-14 MED ORDER — TRAMADOL HCL 50 MG PO TABS
50.0000 mg | ORAL_TABLET | Freq: Four times a day (QID) | ORAL | Status: DC | PRN
Start: 2015-01-14 — End: 2017-05-29

## 2015-01-14 NOTE — Discharge Instructions (Signed)
Take tramadol and naprosyn as needed for pain. Refer to attached documents for more information. Follow up with Dr. Caralyn Guile for further evaluation.
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
## 2015-01-14 NOTE — Progress Notes (Signed)
Orthopedic Tech Progress Note Patient Details:  Brenda [name] 1960/10/10 [id number]  Ortho Devices Type of Ortho Device: Finger splint Ortho Device/Splint Location: rue Ortho Device/Splint Interventions: Application   Antara Brecheisen 01/14/2015, 1:13 PM
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
## 2017-03-15 ENCOUNTER — Encounter (HOSPITAL_COMMUNITY): Payer: Self-pay | Admitting: Emergency Medicine

## 2017-03-15 ENCOUNTER — Emergency Department (HOSPITAL_COMMUNITY)
Admission: EM | Admit: 2017-03-15 | Discharge: 2017-03-15 | Disposition: A | Discharge status: Home / Self Care | Payer: Self-pay | Attending: Emergency Medicine | Admitting: Emergency Medicine

## 2017-03-15 DIAGNOSIS — N611 Abscess of the breast and nipple: Secondary | ICD-10-CM | POA: Insufficient documentation

## 2017-03-15 DIAGNOSIS — N61 Mastitis without abscess: Secondary | ICD-10-CM | POA: Insufficient documentation

## 2017-03-15 DIAGNOSIS — N644 Mastodynia: Secondary | ICD-10-CM | POA: Insufficient documentation

## 2017-03-15 DIAGNOSIS — F17210 Nicotine dependence, cigarettes, uncomplicated: Secondary | ICD-10-CM | POA: Insufficient documentation

## 2017-03-15 MED ORDER — CEPHALEXIN 500 MG PO CAPS: 1000.0000 mg | ORAL_CAPSULE | Freq: Two times a day (BID) | ORAL | 0 refills | Status: AC

## 2017-03-15 MED ORDER — SULFAMETHOXAZOLE-TRIMETHOPRIM 800-160 MG PO TABS: 1.0000 | ORAL_TABLET | Freq: Two times a day (BID) | ORAL | 0 refills | Status: AC

## 2017-03-15 MED ORDER — HYDROCODONE-ACETAMINOPHEN 5-325 MG PO TABS
1.0000 | ORAL_TABLET | Freq: Four times a day (QID) | ORAL | 0 refills | Status: DC | PRN
Start: 2017-03-15 — End: 2017-05-30

## 2017-03-15 MED ORDER — SULFAMETHOXAZOLE-TRIMETHOPRIM 800-160 MG PO TABS
1.0000 | ORAL_TABLET | Freq: Once | ORAL | Status: AC
  Administered 2017-03-15: 1 via ORAL
  Filled 2017-03-15: qty 1

## 2017-03-15 MED ORDER — CEPHALEXIN 500 MG PO CAPS
1000.0000 mg | ORAL_CAPSULE | Freq: Once | ORAL | Status: AC
  Administered 2017-03-15: 1000 mg via ORAL
  Filled 2017-03-15: qty 2

## 2017-03-15 MED ORDER — HYDROCODONE-ACETAMINOPHEN 5-325 MG PO TABS
1.0000 | ORAL_TABLET | Freq: Once | ORAL | Status: AC
  Administered 2017-03-15: 1 via ORAL
  Filled 2017-03-15: qty 1

## 2017-03-15 MED ORDER — NAPROXEN 500 MG PO TABS: 500.0000 mg | ORAL_TABLET | Freq: Two times a day (BID) | ORAL | 0 refills | Status: DC | PRN

## 2017-03-15 MED ORDER — LIDOCAINE-EPINEPHRINE (PF) 2 %-1:200000 IJ SOLN
INTRAMUSCULAR | Status: AC
  Filled 2017-03-15: qty 20

## 2017-03-15 NOTE — ED Provider Notes (Signed)
[facility] DEPT Provider Note   CSN: [id number] Arrival date & time: 03/15/17  1733     History   Chief Complaint Chief Complaint  Patient presents with  . Breast Pain    HPI Brenda [name] is a 56 y.o. female with a PMHx of depression and alcohol abuse, who presents to the ED with complaints of right breast pain that began yesterday.  Patient states that 2 weeks ago she slipped in the snow and fell onto the frozen snow striking her right breast, she states that it was sore but she did not notice any swelling or any other signs of injury.  She is not sure exactly when she noticed a swollen area which became very painful yesterday.  She states that when she slipped in the snow, she did not have a lump immediately after, nor did she break the skin open.  She describes the pain today as 10/10 constant stinging right breast pain that radiates into her right armpit, worse with raising her right arm and movement, and unrelieved with 2 tablets of 81 mg aspirin.  She reports associated erythema, warmth, swelling, and inversion of the nipple, as well as the "knot" by the nipple.  She denies any known skin wounds or insect bites.  She denies IV drug use.  She has never had a mammogram before.  She does not have a PCP at this time.  She has a family history of breast cancer in her maternal aunt but no other family history of breast cancer otherwise.  She denies any nipple drainage, itching, weeping, red streaking, fevers, chills, CP, SOB, abd pain, N/V/D/C, hematuria, dysuria, myalgias, arthralgias, numbness, tingling, focal weakness, or any other complaints at this time.  She states she's had something similar once before but "it went away on its own".    The history is provided by the patient and medical records. No language interpreter was used.  Abscess  Location:  Torso Torso abscess location:  R breast Abscess quality: painful, redness and warmth   Abscess quality:  not draining, no itching and not weeping   Red streaking: no   Duration:  1 day Progression:  Worsening Pain details:    Quality: stinging.   Severity:  Moderate   Duration:  1 day   Timing:  Constant   Progression:  Worsening Chronicity:  New Context: not diabetes, not immunosuppression, not injected drug use, not insect bite/sting and not skin injury   Relieved by:  Nothing Exacerbated by: raising right arm and movement. Ineffective treatments:  Aspirin Associated symptoms: no fever, no nausea and no vomiting     Past Medical History:  Diagnosis Date  . Depression   . Mental disorder     Patient Active Problem List   Diagnosis Date Noted  . 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 TUBAL LIGATION      OB History    No data available       Home Medications    Prior to Admission medications   Medication Sig Start Date End Date Taking? Authorizing Provider  ciprofloxacin-dexamethasone (CIPRODEX) otic suspension Place 4 drops into the right ear 2 (two) times daily. 05/25/12   Lawyer, Harrell Gave, PA-C  naproxen (NAPROSYN) 500 MG tablet Take 1 tablet (500 mg total) by mouth 2 (two) times daily with a meal. 01/14/15   Szekalski, Kaitlyn, PA-C  predniSONE (DELTASONE) 50 MG tablet Take 1 tablet (  50 mg total) by mouth daily. 05/25/12   Lawyer, Harrell Gave, PA-C  promethazine (PHENERGAN) 25 MG tablet Take 1 tablet (25 mg total) by mouth every 6 (six) hours as needed for nausea. 05/25/12   Lawyer, Harrell Gave, PA-C  sulfamethoxazole-trimethoprim (BACTRIM DS,SEPTRA DS) 800-160 MG per tablet Take 1 tablet by mouth 2 (two) times daily. 05/25/12   Junius Creamer, NP  traMADol (ULTRAM) 50 MG tablet Take 1 tablet (50 mg total) by mouth every 6 (six) hours as needed. 01/14/15   Alvina Chou, PA-C    Family History No family history on file.  Social History Social History   Tobacco  Use  . Smoking status: Current Every Day Smoker    Packs/day: 0.50    Years: 30.00    Pack years: 15.00    Types: Cigarettes  . Smokeless tobacco: Never Used  Substance Use Topics  . Alcohol use: Yes    Comment: on the weekends she has a beer.   . Drug use: No     Allergies   Aspirin adult low [aspirin]   Review of Systems Review of Systems  Constitutional: Negative for chills and fever.  Respiratory: Negative for shortness of breath.   Cardiovascular: Negative for chest pain.  Gastrointestinal: Negative for abdominal pain, constipation, diarrhea, nausea and vomiting.  Genitourinary: Negative for dysuria and hematuria.  Musculoskeletal: Negative for arthralgias and myalgias.  Skin: Positive for color change. Negative for wound.  Allergic/Immunologic: Negative for immunocompromised state.  Neurological: Negative for weakness and numbness.  Psychiatric/Behavioral: Negative for confusion.   All other systems reviewed and are negative for acute change except as noted in the HPI.    Physical Exam Updated Vital Signs BP (!) 144/87 (BP Location: Left Arm)   Pulse (!) 102   Temp 97.7 F (36.5 C) (Oral)   Resp 20   Ht 5\' 7"  (1.702 m)   Wt 53.1 kg (117 lb 1 oz)   LMP 01/19/2011   SpO2 100%   BMI 18.33 kg/m   Physical Exam  Constitutional: She is oriented to person, place, and time. Vital signs are normal. She appears well-developed and well-nourished.  Non-toxic appearance. No distress.  Afebrile, nontoxic, NAD  HENT:  Head: Normocephalic and atraumatic.  Mouth/Throat: Oropharynx is clear and moist and mucous membranes are normal.  Eyes: Conjunctivae and EOM are normal. Right eye exhibits no discharge. Left eye exhibits no discharge.  Neck: Normal range of motion. Neck supple.  Cardiovascular: Normal rate, regular rhythm, normal heart sounds and intact distal pulses. Exam reveals no gallop and no friction rub.  No murmur heard. Pulmonary/Chest: Effort normal and breath  sounds normal. No respiratory distress. She has no decreased breath sounds. She has no wheezes. She has no rhonchi. She has no rales. Right breast exhibits inverted nipple, skin change and tenderness. Right breast exhibits no mass and no nipple discharge. There is breast swelling.  Chaperone present for exam ~4cm fluctuant abscess to the medial aspect of the R breast's nipple/areola, with mild swelling and erythema surrounding this area and extending around the areola and covering the majority of the medial breast and somewhat into the lateral lower quadrant, with induration in this area, causing the nipple to invert, but no nipple discharge noted; moderate TTP to fluctuant areas, slightly in the indurated areas as well; +warmth; no lumps or masses appreciated, no peau d'orange. No red streaking.     Abdominal: Soft. Normal appearance and bowel sounds are normal. She exhibits no distension. There is no tenderness. There  is no rigidity, no rebound, no guarding, no CVA tenderness, no tenderness at McBurney's point and negative Murphy's sign.  Genitourinary: There is breast tenderness. No breast discharge.  Musculoskeletal: Normal range of motion.  Lymphadenopathy:    She has axillary adenopathy.  Shotty R axillary LAD, no significantly swollen lymph nodes appreciated  Neurological: She is alert and oriented to person, place, and time. She has normal strength. No sensory deficit.  Skin: Skin is warm, dry and intact. No rash noted.  Psychiatric: She has a normal mood and affect.  Nursing note and vitals reviewed.    ED Treatments / Results  Labs (all labs ordered are listed, but only abnormal results are displayed) Labs Reviewed  AEROBIC CULTURE (SUPERFICIAL SPECIMEN)    EKG  EKG Interpretation None       Radiology No results found.  Procedures .Marland KitchenIncision and Drainage Date/Time: 03/15/2017 8:55 PM Performed by: Reece Agar, PA-C Authorized by: Reece Agar, [place]    Consent:    Consent obtained:  Verbal   Consent given by:  Patient   Risks discussed:  Incomplete drainage, pain and damage to other organs   Alternatives discussed:  Alternative treatment and referral Location:    Type:  Abscess   Size:  ~4cm   Location:  Trunk   Trunk location:  R breast Pre-procedure details:    Skin preparation:  Betadine Anesthesia (see MAR for exact dosages):    Anesthesia method:  Topical application   Topical anesthesia: Pain Ease Spray. Procedure type:    Complexity:  Simple Procedure details:    Needle aspiration: yes     Needle size:  18 G   Incision depth:  Subcutaneous   Drainage:  Purulent   Drainage amount:  Moderate   Wound treatment:  Wound left open   Packing materials:  None Post-procedure details:    Patient tolerance of procedure:  Tolerated well, no immediate complications   (including critical care time)  Medications Ordered in ED Medications  lidocaine-EPINEPHrine (XYLOCAINE W/EPI) 2 %-1:200000 (PF) injection (  Not Given 03/15/17 2218)  HYDROcodone-acetaminophen (NORCO/VICODIN) 5-325 MG per tablet 1 tablet (1 tablet Oral Given 03/15/17 2157)  sulfamethoxazole-trimethoprim (BACTRIM DS,SEPTRA DS) 800-160 MG per tablet 1 tablet (1 tablet Oral Given 03/15/17 2157)  cephALEXin (KEFLEX) capsule 1,000 mg (1,000 mg Oral Given 03/15/17 2157)     Initial Impression / Assessment and Plan / ED Course  I have reviewed the triage vital signs and the nursing notes.  Pertinent labs & imaging results that were available during my care of the patient were reviewed by me and considered in my medical decision making (see chart for details).     56 y.o. female here with R breast pain, redness, swelling, and warmth x1 day, fell 2wks ago in the snow and was sore, but then pain/symptoms didn't begin until yesterday. On exam, ~4cm fluctuant abscess to the medial aspect of the nipple, mild swelling and erythema surrounding the areola and covering the  majority of the medial breast and somewhat into the lateral lower quadrant, with induration in this area, causing the nipple to invert, no nipple discharge, moderate TTP and warmth to fluctuant areas; no lumps or masses appreciated, no significant axillary LAD noted (some shotty nodes but nothing else). No peau d'orange. Looks more consistent with a breast abscess rather than a breast cancer, although cancerous lesion is always on the differential. Will proceed with needle aspiration of suspected abscess, and likely start on abx and refer to get mammogram done  to ensure no underlying mass.  Discussed case with my attending Dr. Ellender Hose who agrees with plan.    9:09 PM Needle aspiration successful and produced moderate amount of purulent drainage, 18G needle used and small puncture wound left open and expresses drainage easily. Sample sent for culture. Outline drawn, advised to return if spreading occurs. Will d/c home with instructions to use warm compresses to the area, discussed abx use, will send home with pain meds, and have her f/up with [facility] in 2-3 days for wound check and to establish care with them, as well as the breast center in 1 week for recheck as well as likely mammogram vs U/S. Strict return precautions advised. I explained the diagnosis and have given explicit precautions to return to the ER including for any other new or worsening symptoms. The patient understands and accepts the medical plan as it's been dictated and I have answered their questions. Discharge instructions concerning home care and prescriptions have been given. The patient is STABLE and is discharged to home in good condition.   [facility] reviewed prior to dispensing controlled substance medications, and 2 year search was notable for: None found. Risks/benefits/alternatives and expectations discussed regarding controlled substances. Side effects of medications discussed. Informed consent obtained.    Final Clinical  Impressions(s) / ED Diagnoses   Final diagnoses:  Breast abscess  Breast pain, right  Cellulitis of breast    ED Discharge Orders        Ordered    cephALEXin (KEFLEX) 500 MG capsule  2 times daily     03/15/17 2111    sulfamethoxazole-trimethoprim (BACTRIM DS,SEPTRA DS) 800-160 MG tablet  2 times daily     03/15/17 2111    naproxen (NAPROSYN) 500 MG tablet  2 times daily PRN     03/15/17 2111    HYDROcodone-acetaminophen (NORCO) 5-325 MG tablet  Every 6 hours PRN     03/15/17 2111       Brenda [name], [place], [place] 03/15/17 2232    Duffy Bruce, MD 03/17/17 0230

## 2017-03-15 NOTE — ED Notes (Signed)
Pain Ease effective for pain control during needle aspiration lidocaine with epi not used

## 2017-03-15 NOTE — ED Triage Notes (Signed)
Patient here from home with complaints of right breast pain. States that she tripped and fell on right breast during snow storm. Redness, inflammation, hardness, nipple indention noted to area.

## 2017-03-15 NOTE — Discharge Instructions (Signed)
Keep wound clean and dry. Apply warm compresses to affected area throughout the day, using hot compress for no more than 20 minutes per hour. Take antibiotics until finished, starting 03/16/17 since you received your first doses here in the ER tonight. Take naprosyn and norco as directed, as needed for pain but do not drive or operate machinery with pain medication use. Follow-up with [facility] in 2-3 days for wound recheck and ongoing management of your breast issue. Also follow up with the breast center in 1 week for recheck of symptoms and for likely mammogram or ultrasound of your breast. Monitor area for signs of infection to include, but not limited to: increasing pain, spreading redness, drainage/pus, worsening swelling, or fevers. Return to emergency department for emergent changing or worsening symptoms.

## 2017-03-19 LAB — AEROBIC CULTURE  (SUPERFICIAL SPECIMEN)

## 2017-03-19 LAB — AEROBIC CULTURE W GRAM STAIN (SUPERFICIAL SPECIMEN)

## 2017-03-20 ENCOUNTER — Telehealth: Payer: Self-pay

## 2017-03-20 NOTE — Progress Notes (Signed)
ED Antimicrobial Stewardship Positive Culture Follow Up   Brenda [name] is an 56 y.o. female who presented to [facility] on 03/15/2017 with a chief complaint of  Chief Complaint  Patient presents with  . Breast Pain    Recent Results (from the past 720 hour(s))  Wound or Superficial Culture     Status: None   Collection Time: 03/15/17  8:57 PM  Result Value Ref Range Status   Specimen Description ABSCESS  Final   Special Requests BREAST RIGHT  Final   Gram Stain   Final    ABUNDANT WBC PRESENT, PREDOMINANTLY PMN ABUNDANT GRAM POSITIVE COCCI MODERATE GRAM NEGATIVE COCCOBACILLI    Culture   Final    ABUNDANT ACTINOMYCES SPECIES Standardized susceptibility testing for this organism is not available. Performed at [facility], [place] [address] [place], [place] [address]    Report Status 03/19/2017 FINAL  Final    [x]  Treated with cephalexin and Bactrim DS, organism resistant to prescribed antimicrobial []  Patient discharged originally without antimicrobial agent and treatment is now indicated  New antibiotic prescription:  Call patient  Stop Keflex and Bactrim Start Amoxicillin 500 mg QID X 60 days Recommend probiotic or yogurt  Needs to follow-up with PCP in 1 week   ED Provider: Domenic Moras, PA-C  Susa Raring, PharmD, BCPS 03/20/2017, 9:24 AM Infectious Diseases Pharmacy Resident  Phone# [phone]

## 2017-03-20 NOTE — Telephone Encounter (Signed)
Post ED Visit - Positive Culture Follow-up: Successful Patient Follow-Up  Culture assessed and recommendations reviewed by: []  Elenor Quinones, Pharm.D. []  Heide Guile, Pharm.D., BCPS AQ-ID []  Parks Neptune, Pharm.D., BCPS []  Alycia Rossetti, Pharm.D., BCPS []  Damon, Pharm.D., BCPS, AAHIVP []  Legrand Como, Pharm.D., BCPS, AAHIVP []  Salome Arnt, PharmD, BCPS []  Dimitri Ped, PharmD, BCPS []  Vincenza Hews, PharmD, BCPS Jimmy Footman Pharm D Positive Aerobic culture  []  Patient discharged without antimicrobial prescription and treatment is now indicated [x]  Organism is resistant to prescribed ED discharge antimicrobial []  Patient with positive blood cultures  Changes discussed with ED provider: Domenic Moras [facility] New antibiotic prescription amoxicillin 500 mg WID x 60 days Called to [facility] [phone]  Contacted patient, date 03/20/17, time 0948 Instructed to see PCP in 1 week or return to ED if not getting better.  Take Med with yogurt  Brenda [name], [place] Comment 03/20/2017, 9:46 AM

## 2017-05-29 ENCOUNTER — Emergency Department (HOSPITAL_COMMUNITY)
Admission: EM | Admit: 2017-05-29 | Discharge: 2017-05-30 | Disposition: A | Discharge status: Other Acute Inpatient Hospital | Payer: Self-pay | Attending: Emergency Medicine | Admitting: Emergency Medicine

## 2017-05-29 ENCOUNTER — Encounter (HOSPITAL_COMMUNITY): Payer: Self-pay | Admitting: Emergency Medicine

## 2017-05-29 DIAGNOSIS — F332 Major depressive disorder, recurrent severe without psychotic features: Secondary | ICD-10-CM | POA: Insufficient documentation

## 2017-05-29 DIAGNOSIS — F17210 Nicotine dependence, cigarettes, uncomplicated: Secondary | ICD-10-CM | POA: Insufficient documentation

## 2017-05-29 DIAGNOSIS — F323 Major depressive disorder, single episode, severe with psychotic features: Secondary | ICD-10-CM | POA: Diagnosis present

## 2017-05-29 DIAGNOSIS — F333 Major depressive disorder, recurrent, severe with psychotic symptoms: Secondary | ICD-10-CM | POA: Diagnosis present

## 2017-05-29 DIAGNOSIS — Y906 Blood alcohol level of 120-199 mg/100 ml: Secondary | ICD-10-CM | POA: Insufficient documentation

## 2017-05-29 DIAGNOSIS — F10151 Alcohol abuse with alcohol-induced psychotic disorder with hallucinations: Secondary | ICD-10-CM | POA: Insufficient documentation

## 2017-05-29 DIAGNOSIS — R45851 Suicidal ideations: Secondary | ICD-10-CM | POA: Insufficient documentation

## 2017-05-29 DIAGNOSIS — F1010 Alcohol abuse, uncomplicated: Secondary | ICD-10-CM

## 2017-05-29 LAB — COMPREHENSIVE METABOLIC PANEL
ALT: 16 U/L (ref 14–54)
AST: 25 U/L (ref 15–41)
Albumin: 4.2 g/dL (ref 3.5–5.0)
Alkaline Phosphatase: 105 U/L (ref 38–126)
Anion gap: 11 (ref 5–15)
BILIRUBIN TOTAL: 0.1 mg/dL — AB (ref 0.3–1.2)
BUN: 5 mg/dL — AB (ref 6–20)
CHLORIDE: 97 mmol/L — AB (ref 101–111)
CO2: 24 mmol/L (ref 22–32)
CREATININE: 0.45 mg/dL (ref 0.44–1.00)
Calcium: 8.5 mg/dL — ABNORMAL LOW (ref 8.9–10.3)
GFR calc Af Amer: >60 mL/min (ref >60)
Glucose, Bld: 92 mg/dL (ref 65–99)
Potassium: 3.7 mmol/L (ref 3.5–5.1)
Sodium: 132 mmol/L — ABNORMAL LOW (ref 135–145)
TOTAL PROTEIN: 8.2 g/dL — AB (ref 6.5–8.1)

## 2017-05-29 LAB — RAPID URINE DRUG SCREEN, HOSP PERFORMED
Amphetamines: NOT DETECTED
Barbiturates: NOT DETECTED
Benzodiazepines: NOT DETECTED
Cocaine: NOT DETECTED
OPIATES: NOT DETECTED
TETRAHYDROCANNABINOL: NOT DETECTED

## 2017-05-29 LAB — CBC
HCT: 43.5 % (ref 36.0–46.0)
Hemoglobin: 15 g/dL (ref 12.0–15.0)
MCH: 33 pg (ref 26.0–34.0)
MCHC: 34.5 g/dL (ref 30.0–36.0)
MCV: 95.8 fL (ref 78.0–100.0)
PLATELETS: 283 10*3/uL (ref 150–400)
RBC: 4.54 MIL/uL (ref 3.87–5.11)
RDW: 13.7 % (ref 11.5–15.5)
WBC: 5.3 10*3/uL (ref 4.0–10.5)

## 2017-05-29 LAB — HCG, QUANTITATIVE, PREGNANCY: HCG, BETA CHAIN, QUANT, S: 1 m[IU]/mL (ref <5)

## 2017-05-29 LAB — ETHANOL: ALCOHOL ETHYL (B): 331 mg/dL — AB (ref <10)

## 2017-05-29 LAB — ACETAMINOPHEN LEVEL: Acetaminophen (Tylenol), Serum: <10 ug/mL — ABNORMAL LOW (ref 10–30)

## 2017-05-29 LAB — SALICYLATE LEVEL

## 2017-05-29 MED ORDER — ACETAMINOPHEN 325 MG PO TABS: 650.0000 mg | ORAL_TABLET | ORAL | Status: DC | PRN

## 2017-05-29 MED ORDER — NICOTINE 21 MG/24HR TD PT24
21.0000 mg | MEDICATED_PATCH | Freq: Every day | TRANSDERMAL | Status: DC
  Administered 2017-05-30: 21 mg via TRANSDERMAL
  Filled 2017-05-29: qty 1

## 2017-05-29 MED ORDER — LORAZEPAM 1 MG PO TABS
0.0000 mg | ORAL_TABLET | Freq: Four times a day (QID) | ORAL | Status: DC
  Administered 2017-05-30: 1 mg via ORAL
  Filled 2017-05-29 (×2): qty 1

## 2017-05-29 MED ORDER — LORAZEPAM 2 MG/ML IJ SOLN: 0.0000 mg | Freq: Two times a day (BID) | INTRAMUSCULAR | Status: DC

## 2017-05-29 MED ORDER — VITAMIN B-1 100 MG PO TABS
100.0000 mg | ORAL_TABLET | Freq: Every day | ORAL | Status: DC
  Administered 2017-05-30: 100 mg via ORAL
  Filled 2017-05-29: qty 1

## 2017-05-29 MED ORDER — LORAZEPAM 2 MG/ML IJ SOLN: 0.0000 mg | Freq: Four times a day (QID) | INTRAMUSCULAR | Status: DC

## 2017-05-29 MED ORDER — ACETAMINOPHEN 325 MG PO TABS: 650.0000 mg | ORAL_TABLET | Freq: Once | ORAL | Status: DC

## 2017-05-29 MED ORDER — LORAZEPAM 1 MG PO TABS: 0.0000 mg | ORAL_TABLET | Freq: Two times a day (BID) | ORAL | Status: DC

## 2017-05-29 MED ORDER — THIAMINE HCL 100 MG/ML IJ SOLN: 100.0000 mg | Freq: Every day | INTRAMUSCULAR | Status: DC

## 2017-05-29 MED ORDER — ALUM & MAG HYDROXIDE-SIMETH 200-200-20 MG/5ML PO SUSP
30.0000 mL | Freq: Four times a day (QID) | ORAL | Status: DC | PRN
  Administered 2017-05-30: 30 mL via ORAL
  Filled 2017-05-29: qty 30

## 2017-05-29 MED ORDER — ONDANSETRON HCL 4 MG PO TABS: 4.0000 mg | ORAL_TABLET | Freq: Three times a day (TID) | ORAL | Status: DC | PRN

## 2017-05-29 NOTE — ED Notes (Signed)
ETOH Level: 331

## 2017-05-29 NOTE — ED Triage Notes (Signed)
Per [facility] patient from home for SI with plan to harm herself for past couple days. Hx Alcohol abuse. Vitals: 120/90, 111HR, CBG 138.

## 2017-05-29 NOTE — ED Notes (Signed)
Patient tearful and endorsing depression. Pt with passive suicidal ideations but with no plan at this time. Pt verbally contracts for safety. Pt pleasant and cooperative with care; sitter remains at bedside for safety.

## 2017-05-29 NOTE — ED Provider Notes (Signed)
[facility] DEPT Provider Note   CSN: [id number] Arrival date & time: 05/29/17  1312     History   Chief Complaint Chief Complaint  Patient presents with  . Suicidal    HPI Marcina Kinnison is a 57 y.o. female.  Pt presents to the ED today with si.  EMS said pt called them due to SI.  Pt said she thought she had breast cancer and has "been in her head" the last few days.  She has been drinking alcohol.  She denies feeling si now.  She denies hi.      Past Medical History:  Diagnosis Date  . Depression   . Mental disorder     Patient Active Problem List   Diagnosis Date Noted  . 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 TUBAL LIGATION      OB History    No data available       Home Medications    Prior to Admission medications   Medication Sig Start Date End Date Taking? Authorizing Provider  HYDROcodone-acetaminophen (NORCO) 5-325 MG tablet Take 1 tablet by mouth every 6 (six) hours as needed for severe pain. Patient not taking: Reported on 05/29/2017 03/15/17   Street, [place], PA-C  naproxen (NAPROSYN) 500 MG tablet Take 1 tablet (500 mg total) by mouth 2 (two) times daily as needed for mild pain, moderate pain or headache (TAKE WITH MEALS.). Patient not taking: Reported on 05/29/2017 03/15/17   Street, [place], PA-C    Family History No family history on file.  Social History Social History   Tobacco Use  . Smoking status: Current Every Day Smoker    Packs/day: 0.50    Years: 30.00    Pack years: 15.00    Types: Cigarettes  . Smokeless tobacco: Never Used  Substance Use Topics  . Alcohol use: Yes  . Drug use: No     Allergies   Aspirin adult low [aspirin]   Review of Systems Review of Systems  Psychiatric/Behavioral: Positive for suicidal ideas.  All other systems reviewed and are negative.    Physical  Exam Updated Vital Signs BP (!) 146/93 (BP Location: Left Arm)   Pulse (!) 106   Temp 97.8 F (36.6 C) (Axillary)   Resp 17   LMP 01/19/2011   SpO2 99%   Physical Exam  Constitutional: She is oriented to person, place, and time. She appears well-developed and well-nourished.  HENT:  Head: Normocephalic and atraumatic.  Right Ear: External ear normal.  Left Ear: External ear normal.  Nose: Nose normal.  Mouth/Throat: Oropharynx is clear and moist.  Eyes: Conjunctivae and EOM are normal. Pupils are equal, round, and reactive to light.  Neck: Normal range of motion. Neck supple.  Cardiovascular: Normal rate, regular rhythm, normal heart sounds and intact distal pulses.  Pulmonary/Chest: Effort normal and breath sounds normal.  Abdominal: Soft. Bowel sounds are normal.  Musculoskeletal: Normal range of motion.  Neurological: She is alert and oriented to person, place, and time.  Skin: Skin is warm. Capillary refill takes less than 2 seconds.  Psychiatric: She has a normal mood and affect. Her speech is normal and behavior is normal. She expresses suicidal ideation.  Nursing note and vitals reviewed.    ED Treatments / Results  Labs (all labs ordered are listed, but only abnormal results are displayed) Labs Reviewed  COMPREHENSIVE METABOLIC PANEL - Abnormal; Notable for the following  components:      Result Value   Sodium 132 (*)    Chloride 97 (*)    BUN 5 (*)    Calcium 8.5 (*)    Total Protein 8.2 (*)    Total Bilirubin 0.1 (*)    All other components within normal limits  ETHANOL - Abnormal; Notable for the following components:   Alcohol, Ethyl (B) 331 (*)    All other components within normal limits  ACETAMINOPHEN LEVEL - Abnormal; Notable for the following components:   Acetaminophen (Tylenol), Serum <10 (*)    All other components within normal limits  SALICYLATE LEVEL  CBC  RAPID URINE DRUG SCREEN, HOSP PERFORMED  HCG, QUANTITATIVE, PREGNANCY  I-STAT BETA HCG  BLOOD, ED ([facility], [facility], [facility] ONLY)    EKG  EKG Interpretation None       Radiology No results found.  Procedures Procedures (including critical care time)  Medications Ordered in ED Medications  acetaminophen (TYLENOL) tablet 650 mg (not administered)  LORazepam (ATIVAN) injection 0-4 mg (not administered)    Or  LORazepam (ATIVAN) tablet 0-4 mg (not administered)  LORazepam (ATIVAN) injection 0-4 mg (not administered)    Or  LORazepam (ATIVAN) tablet 0-4 mg (not administered)  thiamine (VITAMIN B-1) tablet 100 mg (not administered)    Or  thiamine (B-1) injection 100 mg (not administered)  acetaminophen (TYLENOL) tablet 650 mg (not administered)  ondansetron (ZOFRAN) tablet 4 mg (not administered)  alum & mag hydroxide-simeth (MAALOX/MYLANTA) 200-200-20 MG/5ML suspension 30 mL (not administered)  nicotine (NICODERM CQ - dosed in mg/24 hours) patch 21 mg (not administered)     Initial Impression / Assessment and Plan / ED Course  I have reviewed the triage vital signs and the nursing notes.  Pertinent labs & imaging results that were available during my care of the patient were reviewed by me and considered in my medical decision making (see chart for details).    Pt was here on 12/24 for a breast abscess.  The abscess was drained and she was told to f/u breast center for a mammogram.  She has not done this.  No evidence of breast cancer diagnosis on Epic.  Pt is intoxicated, but is medically clear otherwise.  TTS consult will placed.  Disposition per TTS recommendation. CIWA protocol initiated.    Final Clinical Impressions(s) / ED Diagnoses   Final diagnoses:  Suicidal ideation  Alcohol abuse    ED Discharge Orders    None       Isla Pence, MD 05/30/17 [phone]

## 2017-05-29 NOTE — ED Notes (Signed)
Bed: [facility]3 Expected date:  Expected time:  Means of arrival:  Comments: 
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
## 2017-05-29 NOTE — BH Assessment (Signed)
[place] Assessment Progress Note  Case was staffed with Reita Cliche DNP who recommended a inpatient admission as appropriate bed placement is investigated.

## 2017-05-30 ENCOUNTER — Encounter (HOSPITAL_COMMUNITY): Payer: Self-pay | Admitting: *Deleted

## 2017-05-30 ENCOUNTER — Other Ambulatory Visit: Payer: Self-pay

## 2017-05-30 ENCOUNTER — Inpatient Hospital Stay (HOSPITAL_COMMUNITY)
Admission: AD | Admit: 2017-05-30 | Discharge: 2017-06-02 | DRG: 885 | Disposition: A | Discharge status: Home / Self Care | Payer: Federal, State, Local not specified - Other | Source: Intra-hospital | Attending: Psychiatry | Admitting: Psychiatry

## 2017-05-30 DIAGNOSIS — F333 Major depressive disorder, recurrent, severe with psychotic symptoms: Principal | ICD-10-CM | POA: Diagnosis present

## 2017-05-30 DIAGNOSIS — K219 Gastro-esophageal reflux disease without esophagitis: Secondary | ICD-10-CM | POA: Diagnosis present

## 2017-05-30 DIAGNOSIS — F17210 Nicotine dependence, cigarettes, uncomplicated: Secondary | ICD-10-CM | POA: Diagnosis present

## 2017-05-30 DIAGNOSIS — R45851 Suicidal ideations: Secondary | ICD-10-CM | POA: Diagnosis not present

## 2017-05-30 DIAGNOSIS — F10239 Alcohol dependence with withdrawal, unspecified: Secondary | ICD-10-CM | POA: Diagnosis present

## 2017-05-30 DIAGNOSIS — G4700 Insomnia, unspecified: Secondary | ICD-10-CM

## 2017-05-30 DIAGNOSIS — Z23 Encounter for immunization: Secondary | ICD-10-CM | POA: Diagnosis not present

## 2017-05-30 DIAGNOSIS — Z79899 Other long term (current) drug therapy: Secondary | ICD-10-CM | POA: Diagnosis not present

## 2017-05-30 DIAGNOSIS — Z818 Family history of other mental and behavioral disorders: Secondary | ICD-10-CM | POA: Diagnosis not present

## 2017-05-30 DIAGNOSIS — F323 Major depressive disorder, single episode, severe with psychotic features: Secondary | ICD-10-CM | POA: Diagnosis present

## 2017-05-30 DIAGNOSIS — F419 Anxiety disorder, unspecified: Secondary | ICD-10-CM | POA: Diagnosis present

## 2017-05-30 DIAGNOSIS — Z915 Personal history of self-harm: Secondary | ICD-10-CM | POA: Diagnosis not present

## 2017-05-30 MED ORDER — RISPERIDONE 1 MG PO TABS: 1.0000 mg | ORAL_TABLET | Freq: Every day | ORAL | Status: DC

## 2017-05-30 MED ORDER — LORAZEPAM 1 MG PO TABS
1.0000 mg | ORAL_TABLET | Freq: Four times a day (QID) | ORAL | Status: AC | PRN
  Administered 2017-05-30: 1 mg via ORAL
  Filled 2017-05-30: qty 1

## 2017-05-30 MED ORDER — GABAPENTIN 100 MG PO CAPS
200.0000 mg | ORAL_CAPSULE | Freq: Two times a day (BID) | ORAL | Status: DC
  Administered 2017-05-30 – 2017-06-02 (×7): 200 mg via ORAL
  Filled 2017-05-30 (×4): qty 2
  Filled 2017-05-30: qty 28
  Filled 2017-05-30 (×6): qty 2
  Filled 2017-05-30: qty 28

## 2017-05-30 MED ORDER — PNEUMOCOCCAL VAC POLYVALENT 25 MCG/0.5ML IJ INJ
0.5000 mL | INJECTION | INTRAMUSCULAR | Status: AC
  Administered 2017-05-31: 0.5 mL via INTRAMUSCULAR

## 2017-05-30 MED ORDER — ACETAMINOPHEN 325 MG PO TABS
650.0000 mg | ORAL_TABLET | Freq: Four times a day (QID) | ORAL | Status: DC | PRN
  Administered 2017-05-31: 650 mg via ORAL
  Filled 2017-05-30: qty 2

## 2017-05-30 MED ORDER — HYDROXYZINE HCL 25 MG PO TABS
25.0000 mg | ORAL_TABLET | Freq: Four times a day (QID) | ORAL | Status: AC | PRN
  Administered 2017-05-31: 25 mg via ORAL
  Filled 2017-05-30: qty 1

## 2017-05-30 MED ORDER — MAGNESIUM HYDROXIDE 400 MG/5ML PO SUSP
30.0000 mL | Freq: Every day | ORAL | Status: DC | PRN
  Administered 2017-06-01: 30 mL via ORAL
  Filled 2017-05-30: qty 30

## 2017-05-30 MED ORDER — LOPERAMIDE HCL 2 MG PO CAPS: 2.0000 mg | ORAL_CAPSULE | ORAL | Status: AC | PRN

## 2017-05-30 MED ORDER — GABAPENTIN 100 MG PO CAPS
200.0000 mg | ORAL_CAPSULE | Freq: Two times a day (BID) | ORAL | Status: DC
  Administered 2017-05-30: 200 mg via ORAL
  Filled 2017-05-30: qty 2

## 2017-05-30 MED ORDER — VITAMIN B-1 100 MG PO TABS
100.0000 mg | ORAL_TABLET | Freq: Every day | ORAL | Status: DC
  Administered 2017-05-31 – 2017-06-02 (×3): 100 mg via ORAL
  Filled 2017-05-30 (×6): qty 1

## 2017-05-30 MED ORDER — INFLUENZA VAC SPLIT QUAD 0.5 ML IM SUSY
0.5000 mL | PREFILLED_SYRINGE | INTRAMUSCULAR | Status: AC
  Administered 2017-05-31: 0.5 mL via INTRAMUSCULAR
  Filled 2017-05-30: qty 0.5

## 2017-05-30 MED ORDER — RISPERIDONE 1 MG PO TABS
1.0000 mg | ORAL_TABLET | Freq: Every day | ORAL | Status: DC
  Administered 2017-05-30 – 2017-06-01 (×3): 1 mg via ORAL
  Filled 2017-05-30: qty 1
  Filled 2017-05-30: qty 7
  Filled 2017-05-30 (×5): qty 1

## 2017-05-30 MED ORDER — ADULT MULTIVITAMIN W/MINERALS CH
1.0000 | ORAL_TABLET | Freq: Every day | ORAL | Status: DC
  Administered 2017-05-31 – 2017-06-02 (×3): 1 via ORAL
  Filled 2017-05-30 (×6): qty 1

## 2017-05-30 MED ORDER — THIAMINE HCL 100 MG/ML IJ SOLN: 100.0000 mg | Freq: Once | INTRAMUSCULAR | Status: DC

## 2017-05-30 MED ORDER — ONDANSETRON 4 MG PO TBDP: 4.0000 mg | ORAL_TABLET | Freq: Four times a day (QID) | ORAL | Status: AC | PRN

## 2017-05-30 NOTE — Progress Notes (Signed)
Patient ID: Brenda [name], female   DOB: [birth year], 57 y.o.   MRN: [id number]   57 year old black female admitted after she presented to [facility] reporting hearing voices telling her to kill herself. Pt Reported that she was positive AH/VH, and that she has been positive for years. Pt reported that most recent the voices told her to jump from a two story building. Pt reported that she also drinks a six pack of beer daily on the weekends and that she has been doing that for years. Pt reported that her depression was a 8, her hopelessness was a 9, and her anxiety was a 8. Pt reported that she was last at [facility] 3 years ago. Pt reported that she was positive AH/VH, and that she was negative SI/HI.

## 2017-05-30 NOTE — Tx Team (Signed)
Initial Treatment Plan 05/30/2017 4:09 PM Brenda [name] ERQ:[id number]    PATIENT STRESSORS: Substance abuse   PATIENT STRENGTHS: Ability for insight General fund of knowledge   PATIENT IDENTIFIED PROBLEMS: Depression    Substance Abuse                 DISCHARGE CRITERIA:  Improved stabilization in mood, thinking, and/or behavior Need for constant or close observation no longer present  PRELIMINARY DISCHARGE PLAN: Return to previous living arrangement  PATIENT/FAMILY INVOLVEMENT: This treatment plan has been presented to and reviewed with the patient, [name], and/or family member.  The patient and family have been given the opportunity to ask questions and make suggestions.  Ventura Sellers, RN 05/30/2017, 4:09 PM

## 2017-05-30 NOTE — Progress Notes (Signed)
CSW obtained patient's signature on voluntary admission and consent for treatment form. CSW faxed form to [facility] and placed on patient's chart.  Abundio Miu, [facility]  Clinical Social Worker [phone]

## 2017-05-30 NOTE — BH Assessment (Signed)
[place] Assessment Progress Note     Patient has been accepted to [facility] Room 507-2 and is ready for admission

## 2017-05-30 NOTE — BH Assessment (Signed)
[place] Assessment Progress Note      Per Dr. Darleene Cleaver, patient is recommended for admission to [facility] into a [facility].

## 2017-05-30 NOTE — Progress Notes (Signed)
Writer spoke with patient 1:1 and she is very quiet and isolative since being admitted.Writer encouraged her to attend group and informed her of phone times and dayroom hours.  She attended group and watched tv briefly before taking her medication and returning to her room. Support given and safety maintained on unit with 15 min checks.

## 2017-05-30 NOTE — ED Notes (Signed)
POC explained. Fluids given.  Patient verbalized understanding of admission.

## 2017-05-30 NOTE — Consult Note (Signed)
[facility] Face-to-Face Psychiatry Consult   Reason for Consult:  Alcohol intoxication and psychosis Referring Physician:  EDP Patient Identification: Brenda [name] MRN:  [id number] Principal Diagnosis: Major depressive disorder, single episode with psychotic features [facility]) Diagnosis:   Patient Active Problem List   Diagnosis Date Noted  . Major depressive disorder, single episode with psychotic features ([place]) [F32.3] 05/30/2017    Priority: High  . 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 female patient admitted with suicidal thoughts and psychosis.  HPI:  Patient with long history of Alcohol use disorder who presents with psychosis, depression and suicidal thoughts following acute alcohol intoxication. Patient reports that she has been hearing voices  telling her to hurt  Herself and making derogatory comments about her. She also reports worsening depressive symptoms characterized by feeling hopeless, lack of motivation, low energy level, suicidal thoughts, poor appetite and low energy level. Patient also reports visual hallucinations; seeing her deceased mother. She drinks about 6 bottles of 40 oz of beer three times a week and denies other illicit drug use.  Past Psychiatric History: as above  Risk to Self: Suicidal Ideation: Yes-Currently Present Suicidal Intent: Yes-Currently Present Is patient at risk for suicide?: Yes Suicidal Plan?: Yes-Currently Present Specify Current Suicidal Plan: Jump off 2 story building Access to Means: Yes Specify Access to Suicidal Means: Pt states jump off building What has been your use of drugs/alcohol within the last 12 months?: Current use How many times?: 1 Other Self Harm Risks: NA Triggers for Past Attempts: Unknown Intentional Self Injurious Behavior: None Risk to Others: Homicidal  Ideation: No Thoughts of Harm to Others: No Current Homicidal Intent: No Current Homicidal Plan: No Access to Homicidal Means: No Identified Victim: NA History of harm to others?: No Assessment of Violence: None Noted Violent Behavior Description: NA Does patient have access to weapons?: No Criminal Charges Pending?: No Does patient have a court date: No Prior Inpatient Therapy: Prior Inpatient Therapy: Yes Prior Therapy Dates: 2013 Prior Therapy Facilty/Provider(s): [facility] Reason for Treatment: MH issues Prior Outpatient Therapy: Prior Outpatient Therapy: No Does patient have an ACCT team?: No Does patient have Intensive In-House Services?  : No Does patient have [facility] services? : No Does patient have P4CC services?: No  Past Medical History:  Past Medical History:  Diagnosis Date  . Depression   . Mental disorder     Past Surgical History:  Procedure Laterality Date  . TUBAL LIGATION     Family History: No family history on file. Family Psychiatric  History:  Social History:  Social History   Substance and Sexual Activity  Alcohol Use Yes     Social History   Substance and Sexual Activity  Drug Use No    Social History   Socioeconomic History  . Marital status: Single    Spouse name: None  . Number of children: None  . Years of education: None  . Highest education level: None  Social Needs  . Financial resource strain: None  . Food insecurity - worry: None  . Food insecurity - inability: None  . Transportation needs - medical: None  . Transportation needs - non-medical: None  Occupational History  . None  Tobacco Use  . Smoking status: Current Every Day Smoker    Packs/day: 0.50    Years: 30.00    Pack years: 15.00  Types: Cigarettes  . Smokeless tobacco: Never Used  Substance and Sexual Activity  . Alcohol use: Yes  . Drug use: No  . Sexual activity: None  Other Topics Concern  . None  Social History Narrative  . None   Additional  Social History:    Allergies:   Allergies  Allergen Reactions  . Aspirin Adult Low [Aspirin]     Stomach upset    Labs:  Results for orders placed or performed during the hospital encounter of 05/29/17 (from the past 48 hour(s))  Rapid urine drug screen (hospital performed)     Status: None   Collection Time: 05/29/17  1:30 PM  Result Value Ref Range   Opiates NONE DETECTED NONE DETECTED   Cocaine NONE DETECTED NONE DETECTED   Benzodiazepines NONE DETECTED NONE DETECTED   Amphetamines NONE DETECTED NONE DETECTED   Tetrahydrocannabinol NONE DETECTED NONE DETECTED   Barbiturates NONE DETECTED NONE DETECTED    Comment: (NOTE) DRUG SCREEN FOR MEDICAL PURPOSES ONLY.  IF CONFIRMATION IS NEEDED FOR ANY PURPOSE, NOTIFY LAB WITHIN 5 DAYS. LOWEST DETECTABLE LIMITS FOR URINE DRUG SCREEN Drug Class                     Cutoff (ng/mL) Amphetamine and metabolites    1000 Barbiturate and metabolites    200 Benzodiazepine                 924 Tricyclics and metabolites     300 Opiates and metabolites        300 Cocaine and metabolites        300 THC                            50 Performed at [facility], [place] [address] [place], [place] [address]   Comprehensive metabolic panel     Status: Abnormal   Collection Time: 05/29/17  2:40 PM  Result Value Ref Range   Sodium 132 (L) 135 - 145 mmol/L   Potassium 3.7 3.5 - 5.1 mmol/L   Chloride 97 (L) 101 - 111 mmol/L   CO2 24 22 - 32 mmol/L   Glucose, Bld 92 65 - 99 mg/dL   BUN 5 (L) 6 - 20 mg/dL   Creatinine, Ser 0.45 0.44 - 1.00 mg/dL   Calcium 8.5 (L) 8.9 - 10.3 mg/dL   Total Protein 8.2 (H) 6.5 - 8.1 g/dL   Albumin 4.2 3.5 - 5.0 g/dL   AST 25 15 - 41 U/L   ALT 16 14 - 54 U/L   Alkaline Phosphatase 105 38 - 126 U/L   Total Bilirubin 0.1 (L) 0.3 - 1.2 mg/dL   GFR calc non Af Amer >60 >60 mL/min   GFR calc Af Amer >60 >60 mL/min    Comment: (NOTE) The eGFR has been calculated using the CKD EPI equation. This  calculation has not been validated in all clinical situations. eGFR's persistently <60 mL/min signify possible Chronic Kidney Disease.    Anion gap 11 5 - 15    Comment: Performed at [facility], [place] [address], [place], [place] [address]  Ethanol     Status: Abnormal   Collection Time: 05/29/17  2:40 PM  Result Value Ref Range   Alcohol, Ethyl (B) 331 (HH) <10 mg/dL    Comment: CRITICAL RESULT CALLED TO, READ BACK BY AND VERIFIED WITH: BARHAM,R AT 3:30PM ON 05/29/17 BY FESTERMAN,C Performed at [facility]  [place] [address], [place], [place] [address]   Salicylate level     Status: None   Collection Time: 05/29/17  2:40 PM  Result Value Ref Range   Salicylate Lvl <9.7 2.8 - 30.0 mg/dL    Comment: Performed at [facility], [place] [address], [place], [place] [address]  Acetaminophen level     Status: Abnormal   Collection Time: 05/29/17  2:40 PM  Result Value Ref Range   Acetaminophen (Tylenol), Serum <10 (L) 10 - 30 ug/mL    Comment:        THERAPEUTIC CONCENTRATIONS VARY SIGNIFICANTLY. A RANGE OF 10-30 ug/mL MAY BE AN EFFECTIVE CONCENTRATION FOR MANY PATIENTS. HOWEVER, SOME ARE BEST TREATED AT CONCENTRATIONS OUTSIDE THIS RANGE. ACETAMINOPHEN CONCENTRATIONS >150 ug/mL AT 4 HOURS AFTER INGESTION AND >50 ug/mL AT 12 HOURS AFTER INGESTION ARE OFTEN ASSOCIATED WITH TOXIC REACTIONS. Performed at [facility], [place] [address] [place], [place] [address]   cbc     Status: None   Collection Time: 05/29/17  2:40 PM  Result Value Ref Range   WBC 5.3 4.0 - 10.5 K/uL   RBC 4.54 3.87 - 5.11 MIL/uL   Hemoglobin 15.0 12.0 - 15.0 g/dL   HCT 43.5 36.0 - 46.0 %   MCV 95.8 78.0 - 100.0 fL   MCH 33.0 26.0 - 34.0 pg   MCHC 34.5 30.0 - 36.0 g/dL   RDW 13.7 11.5 - 15.5 %   Platelets 283 150 - 400 K/uL    Comment: Performed at [facility], [place] [address], [place], [place] [address]  hCG,  quantitative, pregnancy     Status: None   Collection Time: 05/29/17  2:40 PM  Result Value Ref Range   hCG, Beta Chain, Quant, S 1 <5 mIU/mL    Comment:          GEST. AGE      CONC.  (mIU/mL)   <=1 WEEK        5 - 50     2 WEEKS       50 - 500     3 WEEKS       100 - 10,000     4 WEEKS     1,000 - 30,000     5 WEEKS     3,500 - 115,000   6-8 WEEKS     12,000 - 270,000    12 WEEKS     15,000 - 220,000        FEMALE AND NON-PREGNANT FEMALE:     LESS THAN 5 mIU/mL Performed at [facility], [place] [address] [place], [place] [address]     Current Facility-Administered Medications  Medication Dose Route Frequency Provider Last Rate Last Dose  . alum & mag hydroxide-simeth (MAALOX/MYLANTA) 200-200-20 MG/5ML suspension 30 mL  30 mL Oral Q6H PRN Isla Pence, MD   30 mL at 05/30/17 0502  . gabapentin (NEURONTIN) capsule 200 mg  200 mg Oral BID Donnel Venuto, MD      . LORazepam (ATIVAN) injection 0-4 mg  0-4 mg Intravenous Q6H Isla Pence, MD       Or  . LORazepam (ATIVAN) tablet 0-4 mg  0-4 mg Oral Q6H Isla Pence, MD   1 mg at 05/30/17 1021  . [START ON 05/31/2017] LORazepam (ATIVAN) injection 0-4 mg  0-4 mg Intravenous Q12H Isla Pence, MD       Or  . Derrill Memo ON 05/31/2017] LORazepam (ATIVAN) tablet  0-4 mg  0-4 mg Oral Q12H Isla Pence, MD      . nicotine (NICODERM CQ - dosed in mg/24 hours) patch 21 mg  21 mg Transdermal Daily Isla Pence, MD   21 mg at 05/30/17 1016  . ondansetron (ZOFRAN) tablet 4 mg  4 mg Oral Q8H PRN Isla Pence, MD      . risperiDONE (RISPERDAL) tablet 1 mg  1 mg Oral QHS Torez Beauregard, MD      . thiamine (VITAMIN B-1) tablet 100 mg  100 mg Oral Daily Isla Pence, MD   100 mg at 05/30/17 1023   Or  . thiamine (B-1) injection 100 mg  100 mg Intravenous Daily Isla Pence, MD       Current Outpatient Medications  Medication Sig Dispense Refill  . HYDROcodone-acetaminophen (NORCO) 5-325 MG tablet Take 1  tablet by mouth every 6 (six) hours as needed for severe pain. (Patient not taking: Reported on 05/29/2017) 15 tablet 0  . naproxen (NAPROSYN) 500 MG tablet Take 1 tablet (500 mg total) by mouth 2 (two) times daily as needed for mild pain, moderate pain or headache (TAKE WITH MEALS.). (Patient not taking: Reported on 05/29/2017) 20 tablet 0    Musculoskeletal: Strength & Muscle Tone: within normal limits Gait & Station: normal Patient leans: N/A  Psychiatric Specialty Exam: Physical Exam  Psychiatric: Her speech is normal. Her mood appears anxious. She is slowed, withdrawn and actively hallucinating. Cognition and memory are normal. She expresses impulsivity. She exhibits a depressed mood. She expresses suicidal ideation.    Review of Systems  Constitutional: Negative.   HENT: Negative.   Eyes: Negative.   Respiratory: Negative.   Cardiovascular: Negative.   Gastrointestinal: Positive for nausea.  Genitourinary: Negative.   Musculoskeletal: Positive for myalgias.  Skin: Negative.   Neurological: Positive for tremors.  Endo/Heme/Allergies: Negative.   Psychiatric/Behavioral: Positive for depression, hallucinations, substance abuse and suicidal ideas. The patient is nervous/anxious and has insomnia.     Blood pressure 120/68, pulse 93, temperature 98.6 F (37 C), temperature source Oral, resp. rate 16, last menstrual period 01/19/2011, SpO2 96 %.There is no height or weight on file to calculate BMI.  General Appearance: Casual  Eye Contact:  Good  Speech:  Clear and Coherent  Volume:  Decreased  Mood:  Anxious, Depressed and Dysphoric  Affect:  Constricted  Thought Process:  Coherent  Orientation:  Full (Time, Place, and Person)  Thought Content:  Hallucinations: Auditory Visual  Suicidal Thoughts:  Yes.  without intent/plan  Homicidal Thoughts:  No  Memory:  Immediate;   Good Recent;   Good Remote;   Good  Judgement:  Poor  Insight:  Shallow  Psychomotor Activity:   Psychomotor Retardation  Concentration:  Concentration: Fair and Attention Span: Fair  Recall:  Good  Fund of Knowledge:  Fair  Language:  Good  Akathisia:  No  Handed:  Right  AIMS (if indicated):     Assets:  Communication Skills Desire for Improvement  ADL's:  Intact  Cognition:  WNL  Sleep:   poor     Treatment Plan Summary: Daily contact with patient to assess and evaluate symptoms and progress in treatment and Medication management  -Continue Lorazepam alcohol withdrawal protocol. -Add Gabapentin 200 mg bid for alcohol withdrawal symptoms/anxiety. -Add Risperdal 23m qhs for psychosis.  Disposition: Recommend psychiatric Inpatient admission when medically cleared.  ACorena Pilgrim MD 05/30/2017 11:44 AM

## 2017-05-31 DIAGNOSIS — G4700 Insomnia, unspecified: Secondary | ICD-10-CM

## 2017-05-31 DIAGNOSIS — R45851 Suicidal ideations: Secondary | ICD-10-CM

## 2017-05-31 DIAGNOSIS — F333 Major depressive disorder, recurrent, severe with psychotic symptoms: Principal | ICD-10-CM

## 2017-05-31 MED ORDER — TRAZODONE HCL 50 MG PO TABS
50.0000 mg | ORAL_TABLET | Freq: Every evening | ORAL | Status: DC | PRN
  Administered 2017-05-31 – 2017-06-01 (×2): 50 mg via ORAL
  Filled 2017-05-31 (×3): qty 1

## 2017-05-31 NOTE — Progress Notes (Signed)
Patient described her day as having been "fair". She states that her day was nice at times but did not go into further detail. Her goal for tomorrow is to see if her new medications are effective.

## 2017-05-31 NOTE — Progress Notes (Signed)
Recreation Therapy Notes  INPATIENT RECREATION THERAPY ASSESSMENT  Patient Details Name: Brenda [name] MRN: [id number] DOB: September 08, 1960 Today's Date: 05/31/2017       Information Obtained From: Patient  Able to Participate in Assessment/Interview: Yes  Patient Presentation: Responsive  Reason for Admission (Per Patient): Suicidal Ideation  Patient reports being admitted into the hospital because she was seeing and hearing things. Patient also reports of having suicidal thoughts  Patient Stressors: Family  Coping Skills:   Music, Talk, Art, Read, Dance  Leisure Interests (2+):  Individual - Reading, Art - Draw, Art - Coloring, Music - Listen  Frequency of Recreation/Participation: Weekly  Awareness of Community Resources:  Yes  Community Resources:  [place]  Current Use: Yes  Expressed Interest in [employer] Information: No  County of Residence:  [place]   Patient Main Form of Transportation: Musician  Patient Strengths:  [place], cooking and cleaning   Patient Identified Areas of Improvement:  To get better with health   Patient Goal for Hospitalization:  Patient was not able to identify goal   Current SI (including self-harm):  No  Current HI:  No  Current AVH: No  Staff Intervention Plan: Group Attendance, Collaborate with Interdisciplinary Treatment Team  Consent to Intern Participation: Yes  Ranell Patrick, Recreation Therapy Intern   Ranell Patrick 05/31/2017, 1:54 PM
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
## 2017-05-31 NOTE — Progress Notes (Signed)
DAR NOTE: Patient presents with anxious affect and depressed mood.  Reports suicidal thoughts, auditory and visual hallucinations.  Verbally contracts for safety.  Described energy level as low and concentration as poor.  Rates depression at 8, hopelessness at 7, and anxiety at 7.  Maintained on routine safety checks.  Medications given as prescribed.  Support and encouragement offered as needed.  Attended group and participated.  States goal for today is "getting better."  Patient is withdrawn and isolative.  Minimal interaction with staff.

## 2017-05-31 NOTE — BHH Suicide Risk Assessment (Signed)
[facility] Admission Suicide Risk Assessment   Nursing information obtained from:  Patient Demographic factors:  Low socioeconomic status Current Mental Status:  NA Loss Factors:  Financial problems / change in socioeconomic status Historical Factors:  NA Risk Reduction Factors:  Sense of responsibility to family  Total Time spent with patient: 1 hour Principal Problem: Major depressive disorder, recurrent episode with mood-congruent psychotic features ([place]) Diagnosis:   Patient Active Problem List   Diagnosis Date Noted  . Major depressive disorder, recurrent episode with mood-congruent psychotic features ([place]) [F33.3] 05/30/2017  . 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  World Pharmacologist [facility]). Score between 0-7:  no or low risk or alcohol related problems. Score between 8-15:  moderate risk of alcohol related problems. Score between 16-19:  high risk of alcohol related problems. Score 20 or above:  warrants further diagnostic evaluation for alcohol dependence and treatment.   CLINICAL FACTORS:   Severe Anxiety and/or Agitation Depression:   Comorbid alcohol abuse/dependence Alcohol/Substance Abuse/Dependencies More than one psychiatric diagnosis Currently Psychotic   Musculoskeletal: Strength & Muscle Tone: within normal limits Gait & Station: normal Patient leans: N/A  Psychiatric Specialty Exam: Physical Exam  Nursing note and vitals reviewed.   ROS- see H&P for details  Blood pressure 96/62, pulse (!) 112, temperature 97.6 F (36.4 C), temperature source Oral, resp. rate 18, height 5\' 6"  (1.676 m), weight 59 kg (130 lb), last menstrual period 01/19/2011.Body mass index is  20.98 kg/m.     COGNITIVE FEATURES THAT CONTRIBUTE TO RISK:  None    SUICIDE RISK:   Mild:  Suicidal ideation of limited frequency, intensity, duration, and specificity.  There are no identifiable plans, no associated intent, mild dysphoria and related symptoms, good self-control (both objective and subjective assessment), few other risk factors, and identifiable protective factors, including available and accessible social support.  PLAN OF CARE: See H&P for details  I certify that inpatient services furnished can reasonably be expected to improve the patient's condition.   Pennelope Bracken, MD 05/31/2017, 4:23 PM

## 2017-05-31 NOTE — H&P (Signed)
Psychiatric Admission Assessment Adult  Patient Identification: Brenda [name] MRN:  [id number] Date of Evaluation:  05/31/2017 Chief Complaint:  MDD with psychotic features Alcohol Abuse Principal Diagnosis: Major depressive disorder, recurrent episode with mood-congruent psychotic features ([place]) Diagnosis:   Patient Active Problem List   Diagnosis Date Noted  . Major depressive disorder, recurrent episode with mood-congruent psychotic features ([place]) [F33.3] 05/30/2017  . 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 Pt had also reported that she has not been taking any psychotropic medications recently, but she was started on risperdal in the ED as well as started on CIWA with ativan and gabapentin.  Upon initial evaluation, pt shares, "I wake up out of my sleep sometimes - it feels like someone is coming for my brain. It started 3 months ago - it kept telling me to jump, and I was on top of a storage structure and I almost did it but my brother pulled me back. Sometimes I see things too - like I see my mom." Pt reports worsening depression for several months without any specific stressor/trigger. She endorses poor sleep with initial and middle insomnia, guilty feelings, low energy, poor concentration, and decreased appetite. She thinks about SI but she has no plan. She denies HI. She reports AH which command her to kill herself. She also reports some VH of seeing her mother. She denies symptoms mania, OCD, and PTSD. She reports drinking about 3 times per week, mostly on weekends, and she will consume "a six pack and a 40-ounce beer." She also had reported in ED that she will typically consume a  bottle of wine or six 40-ounce beers per sitting. She denies all other illicit substance use.  Discussed with patient about treatment options. She notes that [place] have been diminishing since her admission, and she agrees to continue risperdal. We discussed about trial of remeron, and pt was in agreement. She will discuss with SW team about substance use treatment options.  Associated Signs/Symptoms: Depression Symptoms:  depressed mood, insomnia, fatigue, difficulty concentrating, suicidal thoughts without plan, anxiety, loss of energy/fatigue, disturbed sleep, decreased appetite, (Hypo) Manic Symptoms:  Hallucinations, Anxiety Symptoms:  Excessive Worry, Psychotic Symptoms:  Hallucinations: Auditory Visual PTSD Symptoms: NA Total Time spent with patient: 1 hour  Past Psychiatric History:  -previous dx of MDD with psychotic features and alcohol use d/o - 1 previous admission to [facility] in 2013 - no current outpatient provider - She reports 2 previous SA - last was 2 months ago via cutting her wrist but pt states she did not actually cut herself.  Is the patient at risk to self? Yes.    Has the patient been a risk to self in the past 6 months? Yes.    Has the patient been a risk to self within the distant past? Yes.    Is the patient a risk to others? Yes.    Has the patient been a risk to others in the past 6 months? Yes.    Has the patient been a risk to others within the distant past? Yes.     Prior Inpatient Therapy:   Prior Outpatient Therapy:    Alcohol Screening: 1. How often do you have a drink containing alcohol?:  2 to 4 times a month 2. How many drinks containing alcohol do you have on a typical day when you are drinking?: 5 or 6 3. How often do you have six or more drinks on one occasion?: Weekly AUDIT-C Score: 7 4. How often during the last year have you found that you were not able to stop drinking once you had started?: Never 5. How often during the last year have  you failed to do what was normally expected from you becasue of drinking?: Never 6. How often during the last year have you needed a first drink in the morning to get yourself going after a heavy drinking session?: Never 7. How often during the last year have you had a feeling of guilt of remorse after drinking?: Never 8. How often during the last year have you been unable to remember what happened the night before because you had been drinking?: Never 9. Have you or someone else been injured as a result of your drinking?: No 10. Has a relative or friend or a doctor or another health worker been concerned about your drinking or suggested you cut down?: No Alcohol Use Disorder Identification Test Final Score (AUDIT): 7 Intervention/Follow-up: AUDIT Score <7 follow-up not indicated Substance Abuse History in the last 12 months:  Yes.   Consequences of Substance Abuse: Medical Consequences:  worsened depression Previous Psychotropic Medications: Yes  Psychological Evaluations: Yes  Past Medical History:  Past Medical History:  Diagnosis Date  . Depression   . Mental disorder     Past Surgical History:  Procedure Laterality Date  . TUBAL LIGATION     Family History: History reviewed. No pertinent family history. Family Psychiatric  History: brother has bipolar and previous SA Tobacco Screening: Have you used any form of tobacco in the last 30 days? (Cigarettes, Smokeless Tobacco, Cigars, and/or Pipes): Yes Tobacco use, Select all that apply: 4 or less cigarettes per day Are you interested in Tobacco Cessation Medications?: No, patient refused Counseled patient on smoking cessation including recognizing danger situations, developing coping skills and basic information about quitting provided: Refused/Declined practical counseling Social History:  Social History   Substance and Sexual Activity  Alcohol Use Yes     Social History   Substance and Sexual Activity  Drug Use No     Additional Social History:      Pain Medications: none Prescriptions: none Over the Counter: none History of alcohol / drug use?: Yes Negative Consequences of Use: Financial Withdrawal Symptoms: Blackouts, Tremors                    Allergies:   Allergies  Allergen Reactions  . Aspirin Adult Low [Aspirin]     Stomach upset   Lab Results: No results found for this or any previous visit (from the past 48 hour(s)).  Blood Alcohol level:  Lab Results  Component Value Date   ETH 331 (HH) 05/29/2017   ETH 256 (H) 54/62/7035    Metabolic Disorder Labs:  No results found for: HGBA1C, MPG No results found for: PROLACTIN No results found for: CHOL, TRIG, HDL, CHOLHDL, VLDL, LDLCALC  Current Medications: Current Facility-Administered Medications  Medication Dose Route Frequency Provider Last Rate Last Dose  . acetaminophen (TYLENOL) tablet 650 mg  650 mg Oral Q6H PRN Patrecia Pour, NP   650 mg at 05/31/17 0807  . gabapentin (NEURONTIN) capsule 200 mg  200 mg Oral BID Patrecia Pour, NP   200 mg at 05/31/17 0806  .  hydrOXYzine (ATARAX/VISTARIL) tablet 25 mg  25 mg Oral Q6H PRN Derrill Center, NP      . loperamide (IMODIUM) capsule 2-4 mg  2-4 mg Oral PRN Derrill Center, NP      . LORazepam (ATIVAN) tablet 1 mg  1 mg Oral Q6H PRN Derrill Center, NP   1 mg at 05/30/17 2149  . magnesium hydroxide (MILK OF MAGNESIA) suspension 30 mL  30 mL Oral Daily PRN Patrecia Pour, NP      . multivitamin with minerals tablet 1 tablet  1 tablet Oral Daily Derrill Center, NP   1 tablet at 05/31/17 0806  . ondansetron (ZOFRAN-ODT) disintegrating tablet 4 mg  4 mg Oral Q6H PRN Derrill Center, NP      . risperiDONE (RISPERDAL) tablet 1 mg  1 mg Oral QHS Patrecia Pour, NP   1 mg at 05/30/17 2148  . thiamine (B-1) injection 100 mg  100 mg Intramuscular Once Derrill Center, NP      . thiamine (VITAMIN B-1) tablet 100 mg  100 mg Oral Daily Derrill Center, NP   100 mg at 05/31/17 0806    PTA Medications: No medications prior to admission.    Musculoskeletal: Strength & Muscle Tone: within normal limits Gait & Station: normal Patient leans: N/A  Psychiatric Specialty Exam: Physical Exam  Nursing note and vitals reviewed.   Review of Systems  Constitutional: Negative for chills and fever.  Respiratory: Negative for cough and shortness of breath.   Cardiovascular: Negative for chest pain.  Gastrointestinal: Negative for abdominal pain, heartburn, nausea and vomiting.  Psychiatric/Behavioral: Positive for depression, hallucinations, substance abuse and suicidal ideas. The patient is nervous/anxious and has insomnia.     Blood pressure 96/62, pulse (!) 112, temperature 97.6 F (36.4 C), temperature source Oral, resp. rate 18, height 5\' 6"  (1.676 m), weight 59 kg (130 lb), last menstrual period 01/19/2011.Body mass index is 20.98 kg/m.  General Appearance: Casual and Fairly Groomed  Eye Contact:  Good  Speech:  Clear and Coherent and Normal Rate  Volume:  Normal  Mood:  Anxious and Depressed  Affect:  Congruent and Constricted  Thought Process:  Coherent and Goal Directed  Orientation:  Full (Time, Place, and Person)  Thought Content:  Hallucinations: Auditory Visual  Suicidal Thoughts:  Yes.  without intent/plan  Homicidal Thoughts:  No  Memory:  Immediate;   Fair Recent;   Fair Remote;   Fair  Judgement:  Impaired  Insight:  Fair  Psychomotor Activity:  Normal  Concentration:  Concentration: Fair  Recall:  [employer] of Knowledge:  Fair  Language:  Fair  Akathisia:  No  Handed:    AIMS (if indicated):     Assets:  Resilience  ADL's:  Intact  Cognition:  WNL  Sleep:  Number of Hours: 6.25    Treatment Plan Summary: Daily contact with patient to assess and evaluate symptoms and progress in treatment and Medication management  Observation Level/Precautions:  15 minute checks  Laboratory:  CBC Chemistry Profile HbAIC HCG UDS  Psychotherapy:   Encourage participation in groups and therapeutic milieu   Medications:  Continue risperdal, gabapentin, and CIWA. Start Remeron 15mg  po qhs.  Consultations:  none  Discharge Concerns:    Estimated LOS: 5-7 days  Other:     Physician Treatment Plan for Primary Diagnosis: Major depressive disorder, recurrent episode with mood-congruent psychotic features ([place]) Long Term Goal(s): Improvement in symptoms so as ready for discharge  Short Term Goals: Ability to identify triggers associated with substance abuse/mental health issues will improve  Physician Treatment Plan for Secondary Diagnosis: Principal Problem:   Major depressive disorder, recurrent episode with mood-congruent psychotic features ([place])  Long Term Goal(s): Improvement in symptoms so as ready for discharge  Short Term Goals: Ability to demonstrate self-control will improve  I certify that inpatient services furnished can reasonably be expected to improve the patient's condition.    Pennelope Bracken, MD 3/11/20194:13 PM

## 2017-05-31 NOTE — Tx Team (Signed)
Interdisciplinary Treatment and Diagnostic Plan Update  05/31/2017 Time of Session: 11:27 AM  Brenda [name] MRN: [id number]  Principal Diagnosis: <principal problem not specified>  Secondary Diagnoses: Active Problems:   Major depressive disorder, recurrent severe without psychotic features ([place])   Current Medications:  Current Facility-Administered Medications  Medication Dose Route Frequency Provider Last Rate Last Dose  . acetaminophen (TYLENOL) tablet 650 mg  650 mg Oral Q6H PRN Patrecia Pour, NP   650 mg at 05/31/17 0807  . gabapentin (NEURONTIN) capsule 200 mg  200 mg Oral BID Patrecia Pour, NP   200 mg at 05/31/17 7619  . hydrOXYzine (ATARAX/VISTARIL) tablet 25 mg  25 mg Oral Q6H PRN Derrill Center, NP      . loperamide (IMODIUM) capsule 2-4 mg  2-4 mg Oral PRN Derrill Center, NP      . LORazepam (ATIVAN) tablet 1 mg  1 mg Oral Q6H PRN Derrill Center, NP   1 mg at 05/30/17 2149  . magnesium hydroxide (MILK OF MAGNESIA) suspension 30 mL  30 mL Oral Daily PRN Patrecia Pour, NP      . multivitamin with minerals tablet 1 tablet  1 tablet Oral Daily Derrill Center, NP   1 tablet at 05/31/17 0806  . ondansetron (ZOFRAN-ODT) disintegrating tablet 4 mg  4 mg Oral Q6H PRN Derrill Center, NP      . risperiDONE (RISPERDAL) tablet 1 mg  1 mg Oral QHS Patrecia Pour, NP   1 mg at 05/30/17 2148  . thiamine (B-1) injection 100 mg  100 mg Intramuscular Once Derrill Center, NP      . thiamine (VITAMIN B-1) tablet 100 mg  100 mg Oral Daily Derrill Center, NP   100 mg at 05/31/17 0806    PTA Medications: No medications prior to admission.    Patient Stressors: Substance abuse  Patient Strengths: Ability for insight General fund of knowledge  Treatment Modalities: Medication Management, Group therapy, Case management,  1 to 1 session with clinician, Psychoeducation, Recreational therapy.   Physician Treatment Plan for Primary Diagnosis: <principal problem not  specified> Long Term Goal(s): Improvement in symptoms so as ready for discharge  Short Term Goals:    Medication Management: Evaluate patient's response, side effects, and tolerance of medication regimen.  Therapeutic Interventions: 1 to 1 sessions, Unit Group sessions and Medication administration.  Evaluation of Outcomes: Progressing  Physician Treatment Plan for Secondary Diagnosis: Active Problems:   Major depressive disorder, recurrent severe without psychotic features ([place])   Long Term Goal(s): Improvement in symptoms so as ready for discharge  Short Term Goals:    Medication Management: Evaluate patient's response, side effects, and tolerance of medication regimen.  Therapeutic Interventions: 1 to 1 sessions, Unit Group sessions and Medication administration.  Evaluation of Outcomes: Progressing   RN Treatment Plan for Primary Diagnosis: <principal problem not specified> Long Term Goal(s): Knowledge of disease and therapeutic regimen to maintain health will improve  Short Term Goals: Ability to identify and develop effective coping behaviors will improve and Compliance with prescribed medications will improve  Medication Management: RN will administer medications as ordered by provider, will assess and evaluate patient's response and provide education to patient for prescribed medication. RN will report any adverse and/or side effects to prescribing provider.  Therapeutic Interventions: 1 on 1 counseling sessions, Psychoeducation, Medication administration, Evaluate responses to treatment, Monitor vital signs and CBGs as ordered, Perform/monitor CIWA, COWS, AIMS and Fall Risk screenings as ordered,  Perform wound care treatments as ordered.  Evaluation of Outcomes: Progressing   LCSW Treatment Plan for Primary Diagnosis: <principal problem not specified> Long Term Goal(s): Safe transition to appropriate next level of care at discharge, Engage patient in therapeutic group  addressing interpersonal concerns.  Short Term Goals: Engage patient in aftercare planning with referrals and resources  Therapeutic Interventions: Assess for all discharge needs, 1 to 1 time with Social worker, Explore available resources and support systems, Assess for adequacy in community support network, Educate family and significant other(s) on suicide prevention, Complete Psychosocial Assessment, Interpersonal group therapy.  Evaluation of Outcomes: Met     Progress in Treatment: Attending groups: Yes Participating in groups: Yes Taking medication as prescribed: Yes Toleration medication: Yes, no side effects reported at this time Family/Significant other contact made: No Patient understands diagnosis: Yes AEB asking for help with voices and SI Discussing patient identified problems/goals with staff: Yes Medical problems stabilized or resolved: Yes Denies suicidal/homicidal ideation: Yes Issues/concerns per patient self-inventory: None Other: N/A  New problem(s) identified: None identified at this time.   New Short Term/Long Term Goal(s): "I need help with hearing voices and suicidal thoughts."   Discharge Plan or Barriers:   Reason for Continuation of Hospitalization: Depression Hallucinations  Medication stabilization Suicidal ideation   Estimated Length of Stay: 3/15  Attendees: Patient: Brenda [name] 05/31/2017  11:27 AM  Physician: Maris Berger, MD 05/31/2017  11:27 AM  Nursing: Sena Hitch, RN 05/31/2017  11:27 AM  RN Care Manager: Lars Pinks, RN 05/31/2017  11:27 AM  Social Worker: Ripley Fraise 05/31/2017  11:27 AM  Recreational Therapist: Winfield Cunas 05/31/2017  11:27 AM  Other: Norberto Sorenson 05/31/2017  11:27 AM  Other:  05/31/2017  11:27 AM    Scribe for Treatment Team:  Roque Lias LCSW 05/31/2017 11:27 AM

## 2017-05-31 NOTE — Progress Notes (Signed)
Recreation Therapy Notes   Date: 3.11.19 Time: 10:00 am Location: [address] Dayroom   Group Topic: Coping Skills   Goal Area(s) Addresses:  Goal 1.1: To improve coping skills  . Patient will Identify what a coping skills is  . Patient will identify personal coping skills  . Patient will be able to identify how coping skills can improve their wellness  Behavioral Response: Appropriate   Intervention: Game   Activity: Coping Skills Bingo: Each patient received one game board. Patients were able to design their own bingo card using colored pencils. Patients then passed around a cup of calling cards. Patient pulled one calling card and then explained how the selected coping skill could benefit them. The activity finished once all the calling cards have been called.   Education: Radiographer, therapeutic, Self-Esteem   Education Outcome: Acknowledges education  Clinical Observations/Feedback: Patient attended and participated appropriately during Recreation Therapy group treatment. Patient was not able to identify her own coping skills at the beginning of group. At the end of session, patient was able to identify a new coping skill stating "setting a new goal". Patient adequately met Goal 1.1 (see above)   Ranell Patrick, Recreation Therapy Intern  Ranell Patrick 05/31/2017 11:35 AM

## 2017-05-31 NOTE — Progress Notes (Signed)
Psychoeducational Group Note  Date:  05/31/2017 Time:  0932  Group Topic/Focus:  Goals Group:   The focus of this group is to help patients establish daily goals to achieve during treatment and discuss how the patient can incorporate goal setting into their daily lives to aide in recovery.  Participation Level: Did Not Attend  Participation Quality:  Not Applicable  Affect:  Not Applicable  Cognitive:  Not Applicable  Insight:  Not Applicable  Engagement in Group: Not Applicable  Additional Comments:  Pt was asleep and could not attend group this morning.  Linden Tagliaferro E 05/31/2017, 9:32 AM

## 2017-05-31 NOTE — [facility] Group Notes (Signed)
LCSW Group Therapy Note   05/31/2017 1:15pm   Type of Therapy and Topic:  Group Therapy:  Overcoming Obstacles   Participation Level:  Active   Description of Group:    In this group patients will be encouraged to explore what they see as obstacles to their own wellness and recovery. They will be guided to discuss their thoughts, feelings, and behaviors related to these obstacles. The group will process together ways to cope with barriers, with attention given to specific choices patients can make. Each patient will be challenged to identify changes they are motivated to make in order to overcome their obstacles. This group will be process-oriented, with patients participating in exploration of their own experiences as well as giving and receiving support and challenge from other group members.   Therapeutic Goals: 1. Patient will identify personal and current obstacles as they relate to admission. 2. Patient will identify barriers that currently interfere with their wellness or overcoming obstacles.  3. Patient will identify feelings, thought process and behaviors related to these barriers. 4. Patient will identify two changes they are willing to make to overcome these obstacles:      Summary of Patient Progress   stayed the entire time, engaged throughout.  Talked about the need to stay away from negative people, who she identifies as "people who just want to hang out."  Her solution is to move, and to spend more time with her granddaughters.   Therapeutic Modalities:   Cognitive Behavioral Therapy Solution Focused Therapy Motivational Interviewing Relapse Prevention Therapy  Trish Mage, LCSW 05/31/2017 2:21 PM

## 2017-05-31 NOTE — [facility] Suicide Risk Assessment (Signed)
[place] INPATIENT:  Family/Significant Other Suicide Prevention Education  Suicide Prevention Education:  Patient Refusal for Family/Significant Other Suicide Prevention Education: The patient Brenda [name] has refused to provide written consent for family/significant other to be provided Family/Significant Other Suicide Prevention Education during admission and/or prior to discharge.  Physician notified.  Trish Mage 05/31/2017, 3:29 PM

## 2017-06-01 MED ORDER — FAMOTIDINE 20 MG PO TABS
20.0000 mg | ORAL_TABLET | Freq: Two times a day (BID) | ORAL | Status: DC | PRN
  Administered 2017-06-01 – 2017-06-02 (×3): 20 mg via ORAL
  Filled 2017-06-01: qty 14
  Filled 2017-06-01 (×3): qty 1

## 2017-06-01 NOTE — Progress Notes (Signed)
Recreation Therapy Notes  Date: 3.12.19 Time: 10:00 a.m. Location: [address] Dayroom   Group Topic: Self-Esteem   Goal Area(s) Addresses:  Goal 1.1: To increase self-esteem  - Group will improve mood through participation during Recreation Therapy tx.  - Group will identify at least two positive traits by the end of therapy session.   - Group will identify the importance of self-esteem  Behavioral Response: Appropriate   Intervention:Craft/Art    Activity: Positive Affirmation Banner: Patients had the opportunity to color in a positive affirmation banner of their choice using the color pencils and markers provided.    Education: Self-Esteem   Education Outcome: Acknowledges Education  Clinical Observations/Feedback: Patient was engaged during group activity appropriately participating during Recreation Therapy group tx. Patient was able to identify two positive traits about herself stating "nice and good cook". Patient actively listened during introduction and closing discussion. Patient successfully met Goal 1.1 (see above).   Ranell Patrick, Recreation Therapy Intern   Ranell Patrick 06/01/2017 11:01 AM

## 2017-06-01 NOTE — Plan of Care (Signed)
  Safety: Periods of time without injury will increase 06/01/2017 0030 - Progressing by Providence Crosby, RN Note Pt safe on the unit at this time

## 2017-06-01 NOTE — Progress Notes (Signed)
D: Patient denies SI, HI or AVH. Patient  Is flat and depressed but pleasant and cooperative.  She reports that her sleep and appetite are good.  Pt. Has been attending groups with some participation.  Pt. States her goal is to get well and go home.  Pt. Reports that she is feeling better.   A: Patient given emotional support from RN. Patient encouraged to come to staff with concerns and/or questions. Patient's medication routine continued. Patient's orders and plan of care reviewed.   R: Patient remains appropriate and cooperative. Will continue to monitor patient q15 minutes for safety.

## 2017-06-01 NOTE — Progress Notes (Signed)
D: Pt passive SI/ AH- contracts and the voices very low. Pt is pleasant and cooperative. Pt said she felt better.  A: Pt was offered support and encouragement. Pt was given scheduled medications. Pt was encourage to attend groups. Q 15 minute checks were done for safety.   R:Pt attends groups and interacts well with peers and staff. Pt is taking medication. Pt receptive to treatment and safety maintained on unit.
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
## 2017-06-01 NOTE — Progress Notes (Signed)
[facility] MD Progress Note  06/01/2017 3:41 PM Brenda [name]  MRN:  [id number] Subjective:    Brenda [name] is a 57 y/o F with history of MDD with psychotic features and alcohol use disorder who was admitted voluntarily with worsening depression, SI without plan, CAH to kill herself, VH, and worsening use of alcohol. Pt had also reported that she has not been taking any psychotropic medications recently, but she was started on risperdal in the ED as well as started on CIWA with ativan and gabapentin. She was continued on those medications after transfer to [facility], and she was also started on trial of remeron to help with mood symptoms and insomnia.  Today upon evaluation, pt shares, "I'm feeling much better today - no voices, and I slept all night." Pt denies any specific concerns today. She denies SI/HI/AH/VH. She has no physical complaints. She is sleeping well and her appetite is good. She is tolerating her medications well, and she would like to continue her current regimen without changes. Discussed with patient about potential plan for discharge to outpatient level of care tomorrow if she maintains symptom stability, and pt was in agreement with that plan. She had no further questions, comments, or concerns.  Principal Problem: Major depressive disorder, recurrent episode with mood-congruent psychotic features ([place]) Diagnosis:   Patient Active Problem List   Diagnosis Date Noted  . Major depressive disorder, recurrent episode with mood-congruent psychotic features ([name]) [F33.3] 05/30/2017  . 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 Depression   . Mental disorder     Past Surgical History:  Procedure Laterality Date  . TUBAL LIGATION     Family History: History reviewed. No pertinent family  history. Family Psychiatric  History: see H&P Social History:  Social History   Substance and Sexual Activity  Alcohol Use Yes     Social History   Substance and Sexual Activity  Drug Use No    Social History   Socioeconomic History  . Marital status: Single    Spouse name: None  . Number of children: None  . Years of education: None  . Highest education level: None  Social Needs  . Financial resource strain: None  . Food insecurity - worry: None  . Food insecurity - inability: None  . Transportation needs - medical: None  . Transportation needs - non-medical: None  Occupational History  . None  Tobacco Use  . Smoking status: Current Every Day Smoker    Packs/day: 0.50    Years: 30.00    Pack years: 15.00    Types: Cigarettes  . Smokeless tobacco: Never Used  Substance and Sexual Activity  . Alcohol use: Yes  . Drug use: No  . Sexual activity: None  Other Topics Concern  . None  Social History Narrative  . None   Additional Social History:    Pain Medications: none Prescriptions: none Over the Counter: none History of alcohol / drug use?: Yes Negative Consequences of Use: Financial Withdrawal Symptoms: Blackouts, Tremors                    Sleep: Good  Appetite:  Good  Current Medications: Current Facility-Administered Medications  Medication Dose Route Frequency Provider Last Rate Last Dose  . acetaminophen (TYLENOL) tablet 650 mg  650 mg Oral Q6H PRN Waylan Boga  Y, NP   650 mg at 05/31/17 0807  . famotidine (PEPCID) tablet 20 mg  20 mg Oral BID PRN Pennelope Bracken, MD      . gabapentin (NEURONTIN) capsule 200 mg  200 mg Oral BID Patrecia Pour, NP   200 mg at 06/01/17 0758  . hydrOXYzine (ATARAX/VISTARIL) tablet 25 mg  25 mg Oral Q6H PRN Derrill Center, NP   25 mg at 05/31/17 2217  . loperamide (IMODIUM) capsule 2-4 mg  2-4 mg Oral PRN Derrill Center, NP      . LORazepam (ATIVAN) tablet 1 mg  1 mg Oral Q6H PRN Derrill Center,  NP   1 mg at 05/30/17 2149  . magnesium hydroxide (MILK OF MAGNESIA) suspension 30 mL  30 mL Oral Daily PRN Patrecia Pour, NP      . multivitamin with minerals tablet 1 tablet  1 tablet Oral Daily Derrill Center, NP   1 tablet at 06/01/17 0758  . ondansetron (ZOFRAN-ODT) disintegrating tablet 4 mg  4 mg Oral Q6H PRN Derrill Center, NP      . risperiDONE (RISPERDAL) tablet 1 mg  1 mg Oral QHS Patrecia Pour, NP   1 mg at 05/31/17 2217  . thiamine (B-1) injection 100 mg  100 mg Intramuscular Once Derrill Center, NP      . thiamine (VITAMIN B-1) tablet 100 mg  100 mg Oral Daily Derrill Center, NP   100 mg at 06/01/17 0758  . traZODone (DESYREL) tablet 50 mg  50 mg Oral QHS PRN,MR X 1 Laverle Hobby, PA-C   50 mg at 05/31/17 2217    Lab Results: No results found for this or any previous visit (from the past 48 hour(s)).  Blood Alcohol level:  Lab Results  Component Value Date   ETH 331 (HH) 05/29/2017   ETH 256 (H) 92/01/9416    Metabolic Disorder Labs: No results found for: HGBA1C, MPG No results found for: PROLACTIN No results found for: CHOL, TRIG, HDL, CHOLHDL, VLDL, LDLCALC  Physical Findings: AIMS:  , ,  ,  ,    CIWA:  CIWA-Ar Total: 1 COWS:     Musculoskeletal: Strength & Muscle Tone: within normal limits Gait & Station: normal Patient leans: N/A  Psychiatric Specialty Exam: Physical Exam  Nursing note and vitals reviewed.   Review of Systems  Constitutional: Negative for chills and fever.  Respiratory: Negative for cough and shortness of breath.   Cardiovascular: Negative for chest pain.  Gastrointestinal: Negative for heartburn and nausea.  Psychiatric/Behavioral: Negative for depression, hallucinations and suicidal ideas. The patient is not nervous/anxious.     Blood pressure 110/70, pulse (!) 116, temperature 97.9 F (36.6 C), temperature source Oral, resp. rate 18, height 5\' 6"  (1.676 m), weight 59 kg (130 lb), last menstrual period 01/19/2011.Body mass  index is 20.98 kg/m.  General Appearance: Fairly Groomed  Eye Contact:  Good  Speech:  Clear and Coherent and Normal Rate  Volume:  Normal  Mood:  Euthymic  Affect:  Congruent and Full Range  Thought Process:  Coherent and Goal Directed  Orientation:  Full (Time, Place, and Person)  Thought Content:  Logical  Suicidal Thoughts:  No  Homicidal Thoughts:  No  Memory:  Immediate;   Fair Recent;   Fair Remote;   Fair  Judgement:  Fair  Insight:  Fair  Psychomotor Activity:  Normal  Concentration:  Concentration: Fair  Recall:  [employer] of  Knowledge:  Fair  Language:  Fair  Akathisia:  No  Handed:    AIMS (if indicated):     Assets:  Resilience  ADL's:  Intact  Cognition:  WNL  Sleep:  Number of Hours: 6.5   Treatment Plan Summary: Daily contact with patient to assess and evaluate symptoms and progress in treatment and Medication management  -Continue inpatient hospitalization  -MDD with psychotic features   - Continue risperdal 2mg  po qhs   - Continue remeron 15mg  po qhs  - Anxiety   - Continue vistaril 25mg  po q6h prn anxiety   - Continue gabapentin 200mg  po BID     -Alcohol use disorder   - Continue CIWA with ativan  -Insomnia   - Continue trazodone 50mg  po qhs prn insomnia  -GERD   - continue Pepcid 20mg  po BID prn heartburn/indigestion  -Encourage participation in groups and therapeutic milieu  -Disposition planning will be ongoing  Pennelope Bracken, MD 06/01/2017, 3:41 PM

## 2017-06-02 MED ORDER — FAMOTIDINE 20 MG PO TABS: 20.0000 mg | ORAL_TABLET | Freq: Two times a day (BID) | ORAL | 0 refills | Status: DC | PRN

## 2017-06-02 MED ORDER — TRAZODONE HCL 50 MG PO TABS: ORAL_TABLET | ORAL | 0 refills | Status: DC

## 2017-06-02 MED ORDER — GABAPENTIN 100 MG PO CAPS: 200.0000 mg | ORAL_CAPSULE | Freq: Two times a day (BID) | ORAL | 0 refills | Status: DC

## 2017-06-02 MED ORDER — HYDROXYZINE HCL 25 MG PO TABS
25.0000 mg | ORAL_TABLET | Freq: Four times a day (QID) | ORAL | Status: DC | PRN
  Filled 2017-06-02: qty 10

## 2017-06-02 MED ORDER — RISPERIDONE 1 MG PO TABS: 1.0000 mg | ORAL_TABLET | Freq: Every day | ORAL | 0 refills | Status: DC

## 2017-06-02 MED ORDER — TRAZODONE HCL 50 MG PO TABS
50.0000 mg | ORAL_TABLET | Freq: Every day | ORAL | Status: DC
Start: 2017-06-02 — End: 2017-06-02
  Filled 2017-06-02: qty 7

## 2017-06-02 MED ORDER — HYDROXYZINE HCL 25 MG PO TABS: ORAL_TABLET | ORAL | 0 refills | Status: DC

## 2017-06-02 NOTE — Progress Notes (Signed)
  [facility] Adult Case Management Discharge Plan :  Will you be returning to the same living situation after discharge:  Yes,  Home At discharge, do you have transportation home?: Yes,  Family  Do you have the ability to pay for your medications: Yes,  Family   Release of information consent forms completed and in the chart;  Patient's signature needed at discharge.  Patient to Follow up at: Follow-up Information    [facility] Follow up on 06/04/2017.   Specialty:  Behavioral Health Why:  Appointment is 06/04/17 at 8:00am. Please be sure to bring your discharge paperwork, Photo ID and any insurance information (if you have any).  Contact information: [place] [address] [phone]           Next level of care provider has access to [place] and Suicide Prevention discussed: Yes,  Yes  Have you used any form of tobacco in the last 30 days? (Cigarettes, Smokeless Tobacco, Cigars, and/or Pipes): Yes  Has patient been referred to the Quitline?: Patient refused referral  Patient has been referred for addiction treatment: Pt. refused referral  Darleen Crocker, Student-Social Work 06/02/2017, 11:30 AM

## 2017-06-02 NOTE — [facility] Suicide Risk Assessment (Signed)
[facility] Discharge Suicide Risk Assessment   Principal Problem: Major depressive disorder, recurrent episode with mood-congruent psychotic features [facility]) Discharge Diagnoses:  Patient Active Problem List   Diagnosis Date Noted  . Major depressive disorder, recurrent episode with mood-congruent psychotic features ([place]) [F33.3] 05/30/2017  . 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  Respiratory: Negative for cough and shortness of breath.   Cardiovascular: Negative for chest pain.  Gastrointestinal: Positive for heartburn. Negative for abdominal pain, nausea and vomiting.  Psychiatric/Behavioral: Negative for depression, hallucinations and suicidal ideas. The patient is not nervous/anxious and does not have insomnia.     Blood pressure 107/70, pulse (!) 105, temperature 97.8 F (36.6 C), resp. rate 16, height 5\' 6"  (1.676 m), weight 59 kg (130 lb), last menstrual period 01/19/2011.Body mass index is 20.98 kg/m.  General Appearance: Casual  Eye Contact::  Good  Speech:  Clear and Coherent and Normal Rate  Volume:  Normal  Mood:  Euthymic  Affect:  Appropriate and Congruent  Thought Process:  Coherent and Goal Directed  Orientation:  Full (Time, Place, and Person)  Thought Content:  Logical  Suicidal Thoughts:  No  Homicidal Thoughts:  No  Memory:  Immediate;   Good Recent;   Good Remote;   Good  Judgement:  Good  Insight:  Good  Psychomotor Activity:  Normal  Concentration:  Good  Recall:  Good  Fund of Knowledge:Good  Language: Good  Akathisia:  No  Handed:    AIMS (if indicated):     Assets:  Communication Skills Resilience Social Support  Sleep:  Number of Hours: 6.5   Cognition: WNL  ADL's:  Intact   Mental Status Per Nursing Assessment::   On Admission:  NA  Demographic Factors:  Unemployed  Loss Factors: NA  Historical Factors: Impulsivity  Risk Reduction Factors:   Sense of responsibility to family, Living with another person, especially a relative, Positive social support, Positive therapeutic relationship and Positive coping skills or problem solving skills  Continued Clinical Symptoms:  Depression:   Severe  Cognitive Features That Contribute To Risk:  None    Suicide Risk:  Minimal: No identifiable suicidal ideation.  Patients presenting with no risk factors but with morbid ruminations; may be classified as minimal risk based on the severity of the depressive symptoms  Follow-up Information    [facility] Follow up on 06/04/2017.   Specialty:  Behavioral Health Why:  Appointment is 06/04/17 at 8:00am. Please be sure to bring your discharge paperwork, Photo ID and any insurance information (if you have any).  Contact information: [place] [address] [phone]         Subjective Data:  Brenda [name] is a 57 y/o F with history of MDD with psychotic features and alcohol use disorder who was admitted voluntarily with worsening depression, SI without plan, CAH to kill herself, VH, and worsening use of alcohol. Pt had also reported that she has not been taking any psychotropic medications recently, but she was started on risperdal in the ED as well as started on CIWA with ativan and gabapentin. She was continued on those medications after transfer to [facility], and she was also started on trial of remeron to help with mood symptoms and insomnia.  Today upon  evaluation, pt reports she is doing well overall. Her only complaint is some acid reflux for which she took Pepcid last evening, and that resolved the problem. She is otherwise sleeping well. She remarks, "I'm ready to go home today." She denies SI/HI/AH/VH. Her appetite is  good. She is tolerating her medications well. She is in agreement to continue her current regimen without changes. She was able to engage in safety planning including plan to return to [place] or contact emergency services if she feels unable to maintain her own safety or the safety of others. Pt had no further questions, comments, or concerns.   Plan Of Care/Follow-up recommendations:   -Discharge to outpatient level of care  -MDD with psychotic features             - Continue risperdal 1mg  po qhs             - Continue remeron 15mg  po qhs  - Anxiety             - Continue vistaril 25mg  po q6h prn anxiety             - Continue gabapentin 200mg  po BID              -Insomnia             - Continue trazodone 50mg  po qhs prn insomnia  -GERD             - continue Pepcid 20mg  po BID prn heartburn/indigestion  Activity:  as tolerated Diet:  normal Tests:  NA Other:  see above for [place], MD 06/02/2017, 10:04 AM

## 2017-06-02 NOTE — Discharge Summary (Addendum)
Physician Discharge Summary Note  Patient:  Brenda [name] is an 57 y.o., female  MRN:  [id number]  DOB:  04/22/1960  Patient phone:  [phone] (home)   Patient address:   [place] [address],   Total Time spent with patient: Greater than 30 minutes  Date of Admission:  05/30/2017  Date of Discharge: 06-02-17  Reason for Admission: Worsening depression, SI without plan, command auditory hallucinations to kill herself, visual hallucinations & worsening use of alcohol.   Principal Problem: Major depressive disorder, recurrent episode with mood-congruent psychotic features [facility])  Discharge Diagnoses: Patient Active Problem List   Diagnosis Date Noted  . Major depressive disorder, recurrent episode with mood-congruent psychotic features ([place]) [F33.3] 05/30/2017  . 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 Depression   . Mental disorder     Past Surgical History:  Procedure Laterality Date  . TUBAL LIGATION     Family History: History reviewed. No pertinent family history.  Family Psychiatric  History: See Md's SRA  Social History:  Social History   Substance and Sexual Activity  Alcohol Use Yes     Social History   Substance and Sexual Activity  Drug Use No    Social History   Socioeconomic History  . Marital status: Single    Spouse name: None  . Number of children: None  . Years of education: None  . Highest education level: None  Social Needs  . Financial resource strain: None  . Food insecurity - worry: None  . Food insecurity - inability: None  . Transportation needs - medical: None  . Transportation needs - non-medical: None  Occupational History  . None  Tobacco Use  . Smoking status: Current Every Day Smoker    Packs/day: 0.50    Years: 30.00   Pack years: 15.00    Types: Cigarettes  . Smokeless tobacco: Never Used  Substance and Sexual Activity  . Alcohol use: Yes  . Drug use: No  . Sexual activity: None  Other Topics Concern  . None  Social History Narrative  . None   Hospital Course: (Per Md's SRA): Brenda [name] is a 57 y/o F with history of MDD with psychotic features and alcohol use disorder who was admitted voluntarily with worsening depression, SI without plan, CAH to kill herself, VH, and worsening use of alcohol. Pt had also reported that she has not been taking any psychotropic medications recently, but she was started on risperdal in the ED as well as started on CIWA with ativan and gabapentin.She was continued on those medications after transfer to [facility], and she was also started on trial of remeron to help with mood symptoms and insomnia.  Besides the Risperdal 1 mg for mood control, Ativan detox protocol for the alcohol detox & the gabapentin 200 mg for agitation/alcohol withdrawal syndrome, she also was medicated & discharged on; Hydroxyzine 25 mg prn for anxiety & Trazodone 50 mg for insomnia. Brenda [name]'s symptoms responded well to her treatment regimen. This is evidenced by her reports & presentation of improved mood/affect, absence of suicidal ideations, hallucinations & substance withdrawal symptoms. She also received other medication regimen for the other medical issues presented. She tolerated her treatment regimen without any adverse effects or reactions reported. She was enrolled & participated in the group counseling sessions being offered & held on this unit. She  learned coping skills.   Today upon her discharge evaluation with the attending psychiatrist, pt reports she is doing well overall. Her only complaint is some acid reflux for which she took Pepcid last evening, and that resolved the problem. She is otherwise sleeping well. She remarks, "I'm ready to go home today." She denies SI/HI/AH/VH. Her appetite is  good. She is tolerating her medications well. She is in agreement to continue her current regimen without changes. She was able to engage in safety planning including plan to return to [facility] or contact emergency services if she feels unable to maintain her own safety or the safety of others. Pt had no further questions, comments, or concerns.  Upon discharge, Brenda [name] was mentally & medically stable. She will continue mental health care on an outpatient basis as noted below. She was provided with all the necessary information needed to make this appointment without problems. She received from the [facility], a 7 days worth supple samples of her [facility] discharge medications. She left [facility] with all personal belongings in no apparent distress. Transportation per family.  Physical Findings: AIMS:  , ,  ,  ,    CIWA:  CIWA-Ar Total: 1 COWS:     Musculoskeletal: Strength & Muscle Tone: within normal limits Gait & Station: normal Patient leans: N/A  Psychiatric Specialty Exam: Physical Exam  Constitutional: She appears well-developed.  HENT:  Head: Normocephalic.  Eyes: Pupils are equal, round, and reactive to light.  Neck: Normal range of motion.  Cardiovascular:  Pulse rate slighly elevated  Respiratory: Effort normal.  GI: Soft.  Genitourinary:  Genitourinary Comments: Deferred  Musculoskeletal: Normal range of motion.  Neurological: She is alert.  Skin: Skin is warm.    Review of Systems  Constitutional: Negative.   HENT: Negative.   Eyes: Negative.   Respiratory: Negative.   Cardiovascular: Negative.   Gastrointestinal: Negative.   Genitourinary: Negative.   Musculoskeletal: Negative.   Skin: Negative.   Neurological: Negative.   Endo/Heme/Allergies: Negative.   Psychiatric/Behavioral: Positive for depression (Stable ), hallucinations (Hx. psychosis) and substance abuse (Hx. alcohol use disorder). Negative for memory loss and suicidal ideas. The patient has insomnia (Stable). The  patient is not nervous/anxious.     Blood pressure 107/70, pulse (!) 105, temperature 97.8 F (36.6 C), resp. rate 16, height 5\' 6"  (1.676 m), weight 59 kg (130 lb), last menstrual period 01/19/2011.Body mass index is 20.98 kg/m.  See Md's SRA   Have you used any form of tobacco in the last 30 days? (Cigarettes, Smokeless Tobacco, Cigars, and/or Pipes): Yes  Has this patient used any form of tobacco in the last 30 days? (Cigarettes, Smokeless Tobacco, Cigars, and/or Pipes): N/A  Blood Alcohol level:  Lab Results  Component Value Date   ETH 331 ([place]) 05/29/2017   ETH 256 (H) 12/75/1700   Metabolic Disorder Labs:  No results found for: HGBA1C, MPG No results found for: PROLACTIN No results found for: CHOL, TRIG, HDL, CHOLHDL, VLDL, LDLCALC  See Psychiatric Specialty Exam and Suicide Risk Assessment completed by Attending Physician prior to discharge.  Discharge destination:  Home  Is patient on multiple antipsychotic therapies at discharge:  No   Has Patient had three or more failed trials of antipsychotic monotherapy by history:  No  Recommended Plan for Multiple Antipsychotic Therapies: NA  Allergies as of 06/02/2017      Reactions   Aspirin Adult Low [aspirin]    Stomach upset      Medication List  TAKE these medications     Indication  famotidine 20 MG tablet Commonly known as:  PEPCID Take 1 tablet (20 mg total) by mouth 2 (two) times daily as needed for heartburn or indigestion.  Indication:  Gastroesophageal Reflux Disease   gabapentin 100 MG capsule Commonly known as:  NEURONTIN Take 2 capsules (200 mg total) by mouth 2 (two) times daily. For agitation  Indication:  Abuse or Misuse of Alcohol, Alcohol Withdrawal Syndrome, Neuropathic Pain   hydrOXYzine 25 MG tablet Commonly known as:  ATARAX/VISTARIL Take 1 tablet (25 mg) by mouth four times as needed: For anxiety  Indication:  Feeling Anxious   risperiDONE 1 MG tablet Commonly known as:  RISPERDAL Take  1 tablet (1 mg total) by mouth at bedtime. For mood control  Indication:  Mood control   traZODone 50 MG tablet Commonly known as:  DESYREL Take 1 tablet (50 mg) by mouth at bedtime: For sleep  Indication:  Trouble Sleeping      Follow-up Information    [facility] Follow up on 06/04/2017.   Specialty:  Behavioral Health Why:  Appointment is 06/04/17 at 8:00am. Please be sure to bring your discharge paperwork, Photo ID and any insurance information (if you have any).  Contact information: [place] [address] [phone]          Follow-up recommendations:  Activity:  As tolerated Diet: As recommended by your primary care doctor. Keep all scheduled follow-up appointments as recommended.  Comments: Patient is instructed prior to discharge to: Take all medications as prescribed by his/her mental healthcare provider. Report any adverse effects and or reactions from the medicines to his/her outpatient provider promptly. Patient has been instructed & cautioned: To not engage in alcohol and or illegal drug use while on prescription medicines. In the event of worsening symptoms, patient is instructed to call the crisis hotline, 911 and or go to the nearest ED for appropriate evaluation and treatment of symptoms. To follow-up with his/her primary care provider for your other medical issues, concerns and or health care needs.   Signed: Lindell Spar, NP, PMHNP, FNP-BC 06/02/2017, 10:25 AM   Patient seen, Suicide Assessment Completed.  Disposition Plan Reviewed

## 2017-06-02 NOTE — Progress Notes (Signed)
Data. Patient denies SI/HI/AVH. Verbally contracts for safety on the unit and to come to staff before acting of any self harm thoughts/feelings.  Patient interacting well with staff and other patients.  Action. Emotional support and encouragement offered. Education provided on medication, indications and side effect. Q 15 minute checks done for safety. Response. Safety on the unit maintained through 15 minute checks.  Medications taken as prescribed. Attended groups. Remained calm and appropriate through out shift.  Pt. discharged to lobby.  Belongings sheet reviewed and signed by pt. and all belongings sent, including medication samples and scripts, home. Paperwork reviewed and pt. able to verbalize understanding of education. Pt. in no current distress and ambulatory.

## 2017-06-02 NOTE — Progress Notes (Signed)
Recreation Therapy Notes  Date: 3.13.19 Time: 10:00 am  Location: [address]   Group Topic: Therapist, nutritional, Clinical research associate, Conservation officer, nature) Addresses:   Goal 1.1: To build healthy support systems - Patient will identify the importance of a healthy support system - Patient will identify at least one person in their support system  - Patient will identify ways on how to improve their support system                      Behavioral Response: Engaged   Intervention: Horticulture, Art   Activity: Patients were given a cup to use as a flower vase. Patients had the opportunity to paint and decorate their vase however they wanted. Patients then took the provided flowers and placed them into their vase. Patients were encouraged to keep their plant or give to one person in their support system   Education: [place], Clinical research associate, Teamwork, Discharge Planning    Education Outcome: Acknowledges Education  Clinical Observations/Feedback: Patient attended and participated appropriately during Recreation Therapy group treatment successfully engaging in group activity. Patient was able to identify at least one person in their support system. Patient participated during introduction and closing discussion. Patient successfully met Goal 1.1 (see above).   Ranell Patrick, Recreation Therapy Intern   Ranell Patrick 06/02/2017 11:15 AM

## 2017-06-02 NOTE — Progress Notes (Signed)
Patient ID: Brenda [name], female   DOB: [birth year], 57 y.o.   MRN: [id number]  D: Patient observed sitting in dayroom watching TV and not interacting with peers.  Pt mood and affect appears depressed and flat. Pt attended evening wrap up group. Denies SI/HI/AVH and pain.No behavioral issues noted.  A: Support and encouragement offered as needed to express needs. Medications administered as prescribed.  R: Patient is safe and cooperative on unit. Will continue to monitor  for safety and stability.

## 2017-06-03 NOTE — Progress Notes (Signed)
Recreation Therapy Notes  INPATIENT RECREATION TR PLAN  Patient Details Name: Rafaelita Foister MRN: [id number] DOB: February 23, 1961 Today's Date: 06/03/2017  Rec Therapy Plan Is patient appropriate for Therapeutic Recreation?: Yes Treatment times per week: At least three  Estimated Length of Stay: 5-7 days  TR Treatment/Interventions: Group participation (Appropriate participation in recreation Therapy group tx.)  Discharge Criteria Pt will be discharged from therapy if:: Discharged Treatment plan/goals/alternatives discussed and agreed upon by:: Patient/family  Discharge Summary Short term goals set: See Care Plan  Short term goals met: Complete Progress toward goals comments: Groups attended Which groups?: Coping skills, Self-esteem, Healthy Support System Therapeutic equipment acquired: None  Reason patient discharged from therapy: Discharge from hospital Pt/family agrees with progress & goals achieved: Yes Date patient discharged from therapy: 06/02/17  Ranell Patrick, Recreation Therapy Intern   Ranell Patrick 06/03/2017, 1:26 PM

## 2017-06-03 NOTE — Plan of Care (Signed)
3.14.19 Patient attended and participated appropriately in Recreation Therapy group treatment successfully participating in group 2x

## 2017-07-24 ENCOUNTER — Emergency Department (HOSPITAL_COMMUNITY)
Admission: EM | Admit: 2017-07-24 | Discharge: 2017-07-25 | Disposition: A | Discharge status: Home / Self Care | Payer: Self-pay | Attending: Emergency Medicine | Admitting: Emergency Medicine

## 2017-07-24 ENCOUNTER — Other Ambulatory Visit: Payer: Self-pay

## 2017-07-24 ENCOUNTER — Emergency Department (HOSPITAL_COMMUNITY): Payer: Self-pay

## 2017-07-24 ENCOUNTER — Encounter (HOSPITAL_COMMUNITY): Payer: Self-pay

## 2017-07-24 DIAGNOSIS — F17210 Nicotine dependence, cigarettes, uncomplicated: Secondary | ICD-10-CM | POA: Insufficient documentation

## 2017-07-24 DIAGNOSIS — S40022A Contusion of left upper arm, initial encounter: Secondary | ICD-10-CM | POA: Insufficient documentation

## 2017-07-24 DIAGNOSIS — S40021A Contusion of right upper arm, initial encounter: Secondary | ICD-10-CM | POA: Insufficient documentation

## 2017-07-24 DIAGNOSIS — T07XXXA Unspecified multiple injuries, initial encounter: Secondary | ICD-10-CM

## 2017-07-24 DIAGNOSIS — Z79899 Other long term (current) drug therapy: Secondary | ICD-10-CM | POA: Insufficient documentation

## 2017-07-24 DIAGNOSIS — Y998 Other external cause status: Secondary | ICD-10-CM | POA: Insufficient documentation

## 2017-07-24 DIAGNOSIS — F329 Major depressive disorder, single episode, unspecified: Secondary | ICD-10-CM | POA: Insufficient documentation

## 2017-07-24 DIAGNOSIS — Y929 Unspecified place or not applicable: Secondary | ICD-10-CM | POA: Insufficient documentation

## 2017-07-24 DIAGNOSIS — Y9389 Activity, other specified: Secondary | ICD-10-CM | POA: Insufficient documentation

## 2017-07-24 DIAGNOSIS — F1010 Alcohol abuse, uncomplicated: Secondary | ICD-10-CM | POA: Insufficient documentation

## 2017-07-24 IMAGING — CT CT HEAD W/O CM
3 series · 16 of 46 positions shown, 19 images · non-contrast
Comparison: None.

CLINICAL DATA: 56-year-old female with head trauma.

EXAM:
CT HEAD WITHOUT CONTRAST
TECHNIQUE: Contiguous axial images were obtained from the base of the skull
through the vertex without intravenous contrast.

[Series 2: head wo · axial · 0.45mm/px · z∈[-179,-59]mm · 10 of 29 slices shown, 13 images]
[im 3/29  brain]
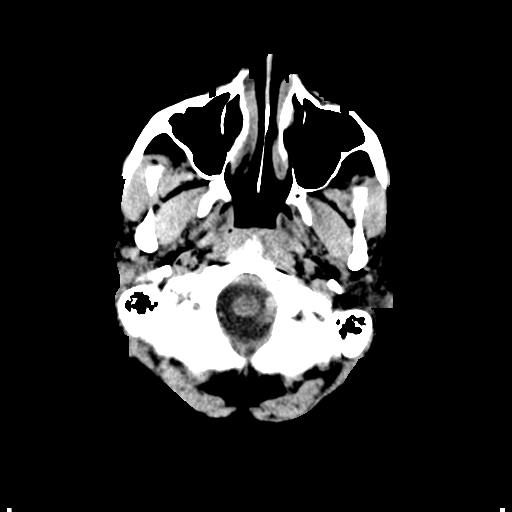
[im 3/29  bone]
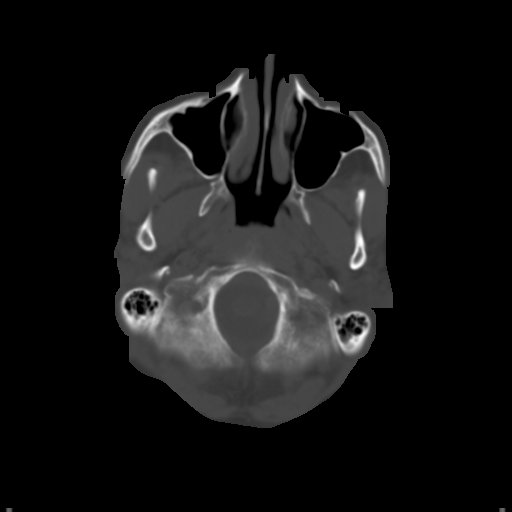
[im 6/29  brain]
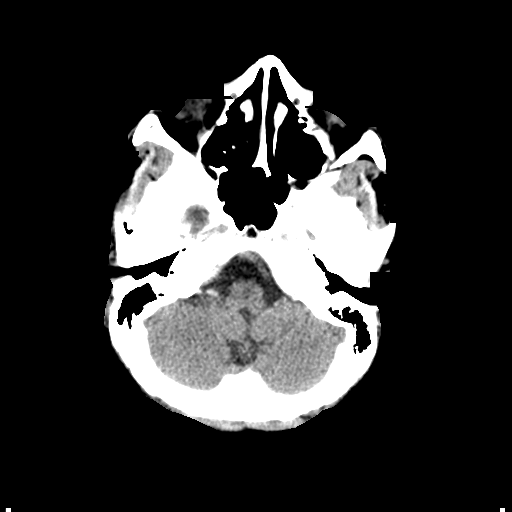
[im 8/29  brain]
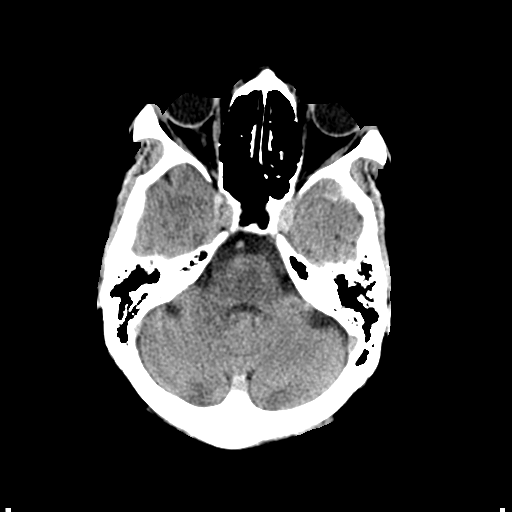
[im 11/29  brain]
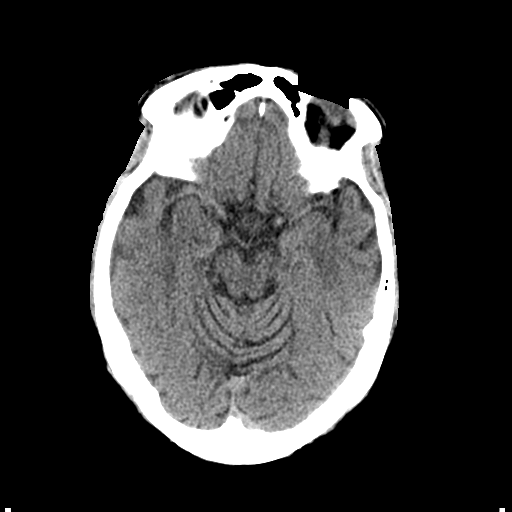
[im 14/29  brain]
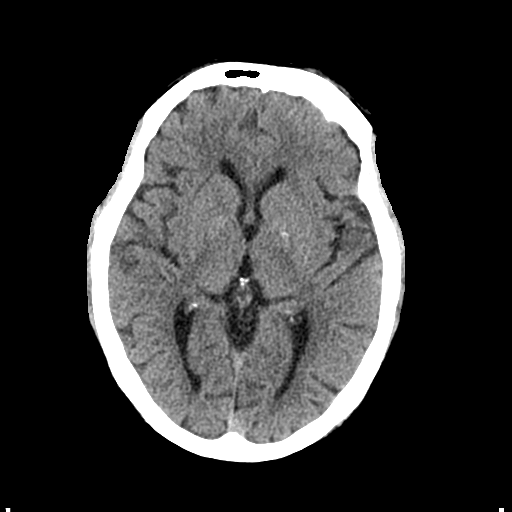
[im 14/29  bone]
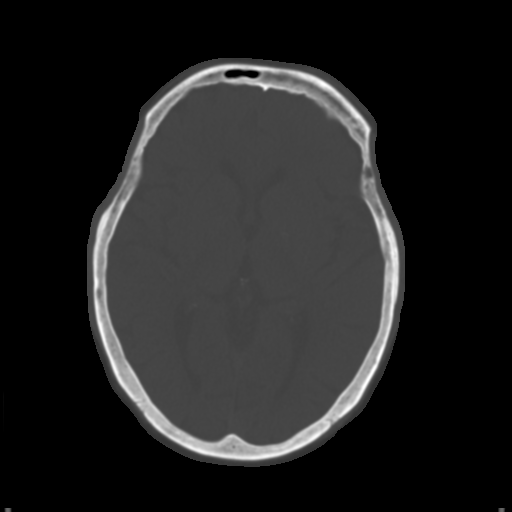
[im 16/29  brain]
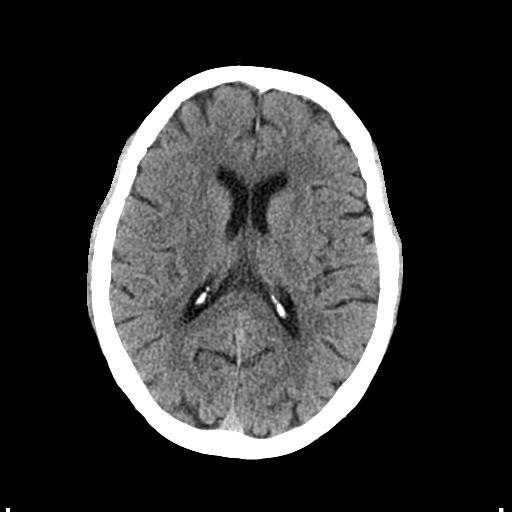
[im 19/29  brain]
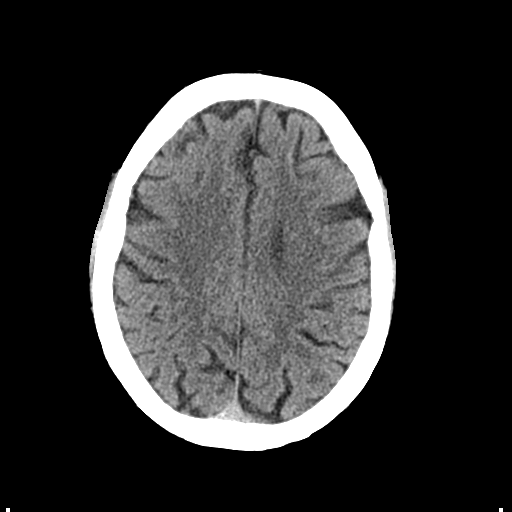
[im 22/29  brain]
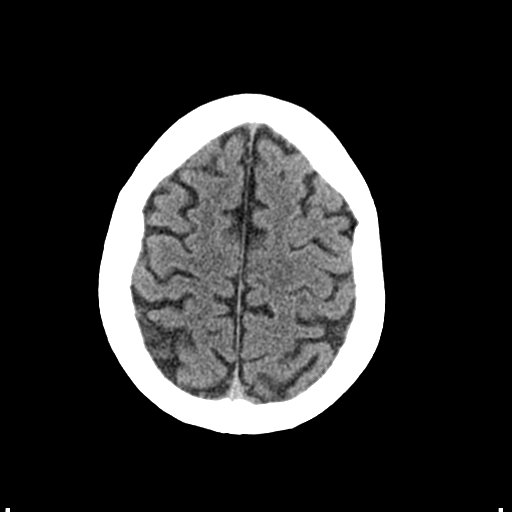
[im 24/29  brain]
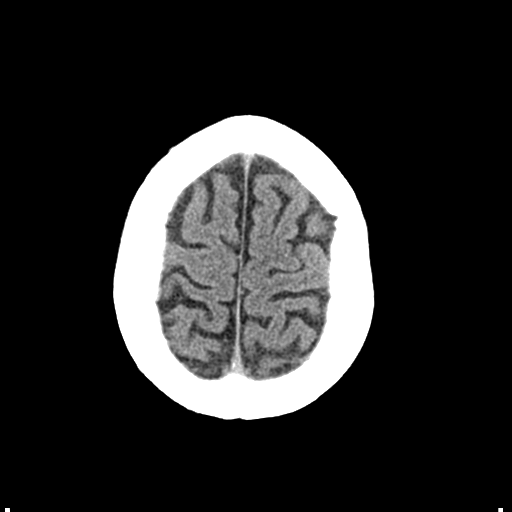
[im 24/29  bone]
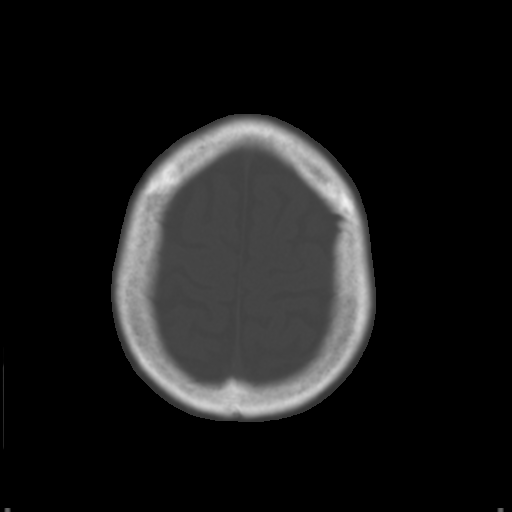
[im 27/29  brain]
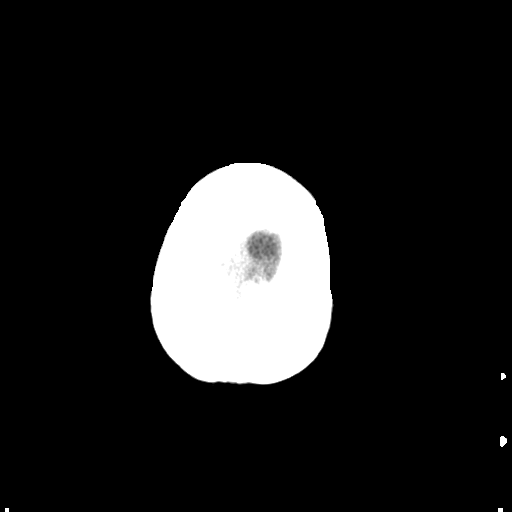

[Series 5: coronal soft tissue · coronal · 0.31mm/px · 3 of 62 slices shown]
[im 21/62  brain]
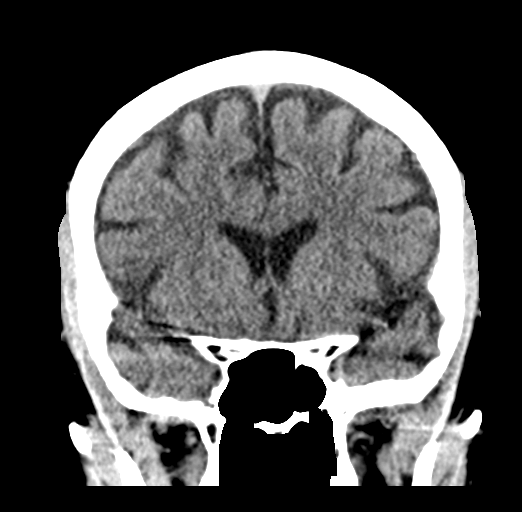
[im 28/62  brain]
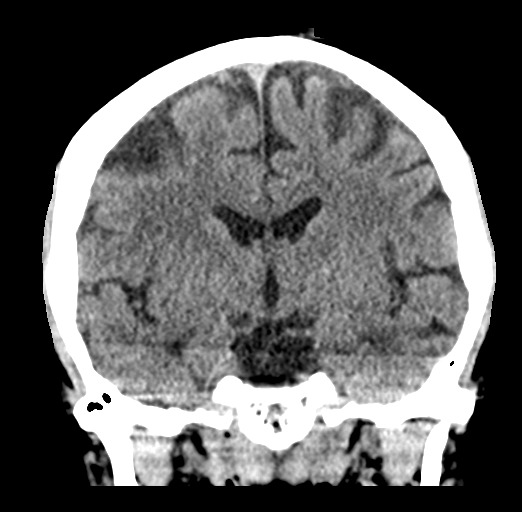
[im 34/62  brain]
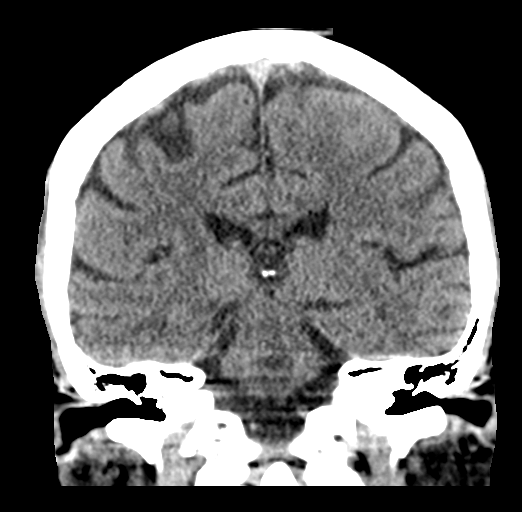

[Series 6: sagittal soft tissue · sagittal · 0.31mm/px · 3 of 47 slices shown]
[im 16/47  brain]
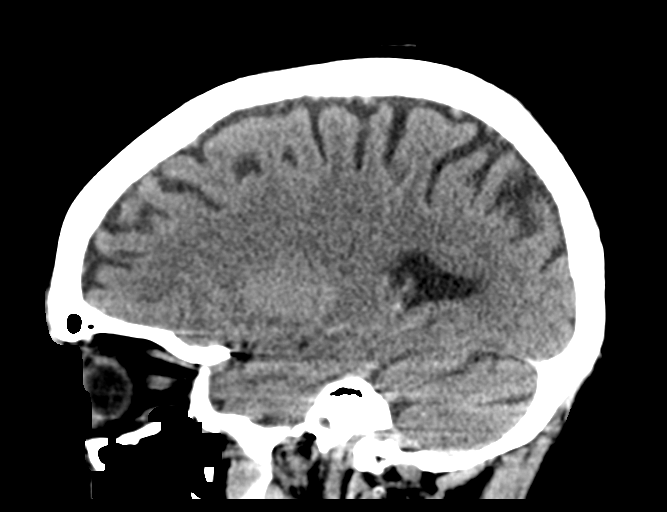
[im 24/47  brain]
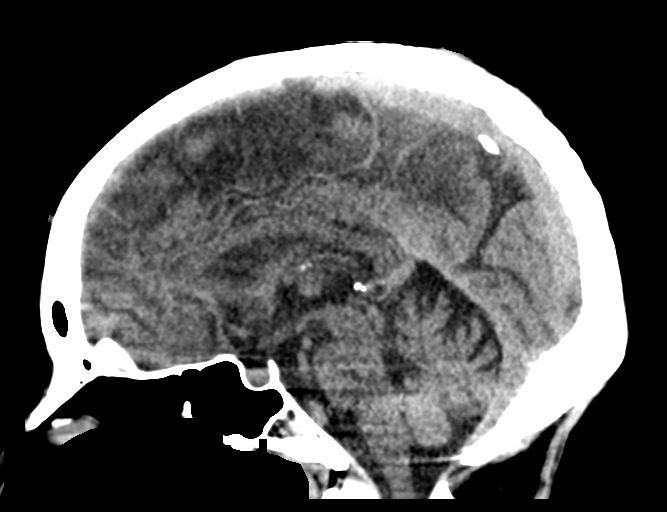
[im 31/47  brain]
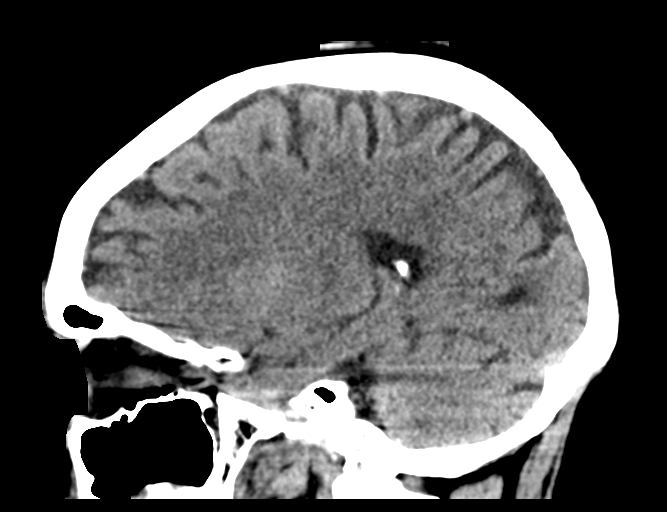

[16 of 46 positions shown; findings below may reference images not displayed]

FINDINGS: Brain: The ventricles and sulci are appropriate size for patient's
age. The gray-white matter discrimination is preserved. There is no
acute intracranial hemorrhage. No mass effect or midline shift. No
extra-axial fluid collection.

Vascular: No hyperdense vessel or unexpected calcification.

Skull: Normal. Negative for fracture or focal lesion.

Sinuses/Orbits: Partially visualized left maxillary sinus retention
cyst or polyp. The remainder of the visualized paranasal sinuses and
mastoid air cells are clear.

Other: None
IMPRESSION: Unremarkable noncontrast CT of the brain. No acute intracranial
pathology.

## 2017-07-24 MED ORDER — OXYCODONE-ACETAMINOPHEN 5-325 MG PO TABS
1.0000 | ORAL_TABLET | Freq: Once | ORAL | Status: DC
  Filled 2017-07-24: qty 1
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
## 2017-07-24 NOTE — ED Notes (Signed)
Bed: WTR5 Expected date:  Expected time:  Means of arrival:  Comments: 
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
## 2017-09-13 ENCOUNTER — Encounter (HOSPITAL_COMMUNITY): Payer: Self-pay | Admitting: Family Medicine

## 2017-09-13 ENCOUNTER — Ambulatory Visit (HOSPITAL_COMMUNITY)
Admission: EM | Admit: 2017-09-13 | Discharge: 2017-09-13 | Disposition: A | Discharge status: Home / Self Care | Payer: Self-pay | Attending: Family Medicine | Admitting: Family Medicine

## 2017-09-13 ENCOUNTER — Ambulatory Visit (HOSPITAL_COMMUNITY): Payer: Self-pay

## 2017-09-13 DIAGNOSIS — H1131 Conjunctival hemorrhage, right eye: Secondary | ICD-10-CM

## 2017-09-13 DIAGNOSIS — S0083XA Contusion of other part of head, initial encounter: Secondary | ICD-10-CM

## 2017-09-13 MED ORDER — HYDROCODONE-ACETAMINOPHEN 5-325 MG PO TABS: 1.0000 | ORAL_TABLET | Freq: Four times a day (QID) | ORAL | 0 refills | Status: DC | PRN

## 2017-09-13 NOTE — ED Notes (Signed)
PT reports she moved out of that home and feels comfortable returning to where she is staying.
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
## 2017-11-11 ENCOUNTER — Encounter (HOSPITAL_COMMUNITY): Payer: Self-pay

## 2017-11-11 ENCOUNTER — Other Ambulatory Visit: Payer: Self-pay

## 2017-11-11 ENCOUNTER — Emergency Department (HOSPITAL_COMMUNITY)
Admission: EM | Admit: 2017-11-11 | Discharge: 2017-11-11 | Disposition: A | Discharge status: Home / Self Care | Payer: Self-pay | Attending: Emergency Medicine | Admitting: Emergency Medicine

## 2017-11-11 DIAGNOSIS — F10220 Alcohol dependence with intoxication, uncomplicated: Secondary | ICD-10-CM | POA: Insufficient documentation

## 2017-11-11 DIAGNOSIS — S01111A Laceration without foreign body of right eyelid and periocular area, initial encounter: Secondary | ICD-10-CM | POA: Insufficient documentation

## 2017-11-11 DIAGNOSIS — W0110XA Fall on same level from slipping, tripping and stumbling with subsequent striking against unspecified object, initial encounter: Secondary | ICD-10-CM | POA: Insufficient documentation

## 2017-11-11 DIAGNOSIS — Y92524 Gas station as the place of occurrence of the external cause: Secondary | ICD-10-CM | POA: Insufficient documentation

## 2017-11-11 DIAGNOSIS — S0181XA Laceration without foreign body of other part of head, initial encounter: Secondary | ICD-10-CM

## 2017-11-11 DIAGNOSIS — F17210 Nicotine dependence, cigarettes, uncomplicated: Secondary | ICD-10-CM | POA: Insufficient documentation

## 2017-11-11 DIAGNOSIS — F10920 Alcohol use, unspecified with intoxication, uncomplicated: Secondary | ICD-10-CM

## 2017-11-11 DIAGNOSIS — Y999 Unspecified external cause status: Secondary | ICD-10-CM | POA: Insufficient documentation

## 2017-11-11 DIAGNOSIS — Y939 Activity, unspecified: Secondary | ICD-10-CM | POA: Insufficient documentation

## 2017-11-11 NOTE — ED Notes (Signed)
Pt is a poor historian at this time. Pt is unable to directly answer questions to assess.
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
## 2017-11-11 NOTE — ED Notes (Addendum)
Pt unable to verbalize how she got laceration above her eye.  Was able to answer orientation questions completely. AOx3. 2 cm laceration above right eye.  RN applied new sterile gauze to site.
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
## 2017-11-11 NOTE — ED Notes (Signed)
Pt discharge paperwork reviewed with patient, pt wheeled to lobby to await ride.  VS WNL, NAD.

## 2017-11-12 ENCOUNTER — Encounter (HOSPITAL_COMMUNITY): Payer: Self-pay | Admitting: Family Medicine

## 2018-01-10 ENCOUNTER — Emergency Department (HOSPITAL_COMMUNITY): Payer: Self-pay

## 2018-01-10 ENCOUNTER — Emergency Department (HOSPITAL_COMMUNITY)
Admission: EM | Admit: 2018-01-10 | Discharge: 2018-01-10 | Disposition: A | Discharge status: Home / Self Care | Payer: Self-pay | Attending: Emergency Medicine | Admitting: Emergency Medicine

## 2018-01-10 ENCOUNTER — Encounter (HOSPITAL_COMMUNITY): Payer: Self-pay | Admitting: Emergency Medicine

## 2018-01-10 DIAGNOSIS — S0010XA Contusion of unspecified eyelid and periocular area, initial encounter: Secondary | ICD-10-CM

## 2018-01-10 DIAGNOSIS — Y999 Unspecified external cause status: Secondary | ICD-10-CM | POA: Insufficient documentation

## 2018-01-10 DIAGNOSIS — W010XXA Fall on same level from slipping, tripping and stumbling without subsequent striking against object, initial encounter: Secondary | ICD-10-CM | POA: Insufficient documentation

## 2018-01-10 DIAGNOSIS — F17210 Nicotine dependence, cigarettes, uncomplicated: Secondary | ICD-10-CM | POA: Insufficient documentation

## 2018-01-10 DIAGNOSIS — S0083XA Contusion of other part of head, initial encounter: Secondary | ICD-10-CM | POA: Insufficient documentation

## 2018-01-10 DIAGNOSIS — Y9301 Activity, walking, marching and hiking: Secondary | ICD-10-CM | POA: Insufficient documentation

## 2018-01-10 DIAGNOSIS — S0011XA Contusion of right eyelid and periocular area, initial encounter: Secondary | ICD-10-CM | POA: Insufficient documentation

## 2018-01-10 DIAGNOSIS — Y929 Unspecified place or not applicable: Secondary | ICD-10-CM | POA: Insufficient documentation
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
## 2018-01-10 MED ORDER — ACETAMINOPHEN 325 MG PO TABS
650.0000 mg | ORAL_TABLET | Freq: Once | ORAL | Status: AC
  Administered 2018-01-10: 650 mg via ORAL
  Filled 2018-01-10: qty 2
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
## 2018-01-10 NOTE — Discharge Instructions (Signed)
Return if any problems.  Tylenol for pain.

## 2018-01-10 NOTE — ED Notes (Signed)
Patient verbalizes understanding of discharge instructions. Opportunity for questioning and answers were provided. Armband removed by staff, pt discharged from ED.  

## 2018-09-03 ENCOUNTER — Emergency Department (HOSPITAL_COMMUNITY)
Admission: EM | Admit: 2018-09-03 | Discharge: 2018-09-03 | Payer: Self-pay | Attending: Emergency Medicine | Admitting: Emergency Medicine

## 2018-09-03 ENCOUNTER — Encounter (HOSPITAL_COMMUNITY): Payer: Self-pay | Admitting: Emergency Medicine

## 2018-09-03 ENCOUNTER — Other Ambulatory Visit: Payer: Self-pay

## 2018-09-03 DIAGNOSIS — Z79899 Other long term (current) drug therapy: Secondary | ICD-10-CM | POA: Insufficient documentation

## 2018-09-03 DIAGNOSIS — Z23 Encounter for immunization: Secondary | ICD-10-CM | POA: Insufficient documentation

## 2018-09-03 DIAGNOSIS — F17210 Nicotine dependence, cigarettes, uncomplicated: Secondary | ICD-10-CM | POA: Insufficient documentation

## 2018-09-03 DIAGNOSIS — Y9389 Activity, other specified: Secondary | ICD-10-CM | POA: Insufficient documentation

## 2018-09-03 DIAGNOSIS — S01511A Laceration without foreign body of lip, initial encounter: Secondary | ICD-10-CM | POA: Insufficient documentation

## 2018-09-03 DIAGNOSIS — Y999 Unspecified external cause status: Secondary | ICD-10-CM | POA: Insufficient documentation

## 2018-09-03 DIAGNOSIS — T7411XA Adult physical abuse, confirmed, initial encounter: Secondary | ICD-10-CM | POA: Insufficient documentation

## 2018-09-03 DIAGNOSIS — Y929 Unspecified place or not applicable: Secondary | ICD-10-CM | POA: Insufficient documentation

## 2018-09-03 MED ORDER — LIDOCAINE HCL (PF) 1 % IJ SOLN
5.0000 mL | Freq: Once | INTRAMUSCULAR | Status: AC
  Administered 2018-09-03: 5 mL
  Filled 2018-09-03: qty 5

## 2018-09-03 MED ORDER — TETANUS-DIPHTH-ACELL PERTUSSIS 5-2.5-18.5 LF-MCG/0.5 IM SUSP
0.5000 mL | Freq: Once | INTRAMUSCULAR | Status: AC
  Administered 2018-09-03: 0.5 mL via INTRAMUSCULAR
  Filled 2018-09-03: qty 0.5
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
## 2018-09-03 NOTE — Discharge Instructions (Signed)
You will need to have your sutures removed in 5 days time.
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
## 2018-09-08 ENCOUNTER — Other Ambulatory Visit: Payer: Self-pay

## 2018-09-08 ENCOUNTER — Emergency Department (HOSPITAL_COMMUNITY)
Admission: EM | Admit: 2018-09-08 | Discharge: 2018-09-08 | Disposition: A | Discharge status: Home / Self Care | Payer: Self-pay | Attending: Emergency Medicine | Admitting: Emergency Medicine

## 2018-09-08 ENCOUNTER — Encounter (HOSPITAL_COMMUNITY): Payer: Self-pay | Admitting: Emergency Medicine

## 2018-09-08 DIAGNOSIS — F17210 Nicotine dependence, cigarettes, uncomplicated: Secondary | ICD-10-CM | POA: Insufficient documentation

## 2018-09-08 DIAGNOSIS — Z4802 Encounter for removal of sutures: Secondary | ICD-10-CM | POA: Insufficient documentation

## 2018-09-08 DIAGNOSIS — S01511D Laceration without foreign body of lip, subsequent encounter: Secondary | ICD-10-CM | POA: Insufficient documentation

## 2018-09-08 DIAGNOSIS — X58XXXD Exposure to other specified factors, subsequent encounter: Secondary | ICD-10-CM | POA: Insufficient documentation

## 2018-09-08 NOTE — Discharge Instructions (Addendum)
Please read attached information. If you experience any new or worsening signs or symptoms please return to the emergency room for evaluation.

## 2018-09-08 NOTE — ED Notes (Addendum)
Suture removal kit & suture cart at bedside.

## 2018-09-08 NOTE — ED Notes (Signed)
Patient verbalizes understanding of discharge instructions. Opportunity for questioning and answers were provided. Armband removed by staff, pt discharged from ED. Ambulated out to lobby  

## 2018-09-08 NOTE — ED Triage Notes (Signed)
Pt needs sutures removed from R upper lip. Placed 6 days ago, lac intact

## 2018-09-08 NOTE — ED Provider Notes (Signed)
[facility] Provider Note   CSN: [id number] Arrival date & time: 09/08/18  1125    History   Chief Complaint Chief Complaint  Patient presents with  . Suture / Staple Removal    HPI Brenda [name] is a 58 y.o. female.     HPI   58 year old female presents today for suture removal.  Patient was seen on 09/03/2018 with a laceration to her right upper lip.  She had 3 stitches placed.  No significant pain, discharge or redness.  Past Medical History:  Diagnosis Date  . Depression   . Mental disorder     Patient Active Problem List   Diagnosis Date Noted  . Major depressive disorder, recurrent episode with mood-congruent psychotic features ([place]) 05/30/2017  . 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 TUBAL LIGATION       OB History   No obstetric history on file.      Home Medications    Prior to Admission medications   Medication Sig Start Date End Date Taking? Authorizing Provider  famotidine (PEPCID) 20 MG tablet Take 1 tablet (20 mg total) by mouth 2 (two) times daily as needed for heartburn or indigestion. 06/02/17   Encarnacion Slates, NP    Family History No family history on file.  Social History Social History   Tobacco Use  . Smoking status: Current Every Day Smoker    Packs/day: 0.50    Years: 30.00    Pack years: 15.00    Types: Cigarettes  . Smokeless tobacco: Never Used  Substance Use Topics  . Alcohol use: Yes  . Drug use: Never     Allergies   Aspirin adult low [aspirin]   Review of Systems Review of Systems  All other systems reviewed and are negative.    Physical Exam Updated Vital Signs BP 122/77 (BP Location: Right Arm)   Pulse (!) 103   Temp 98 F (36.7 C) (Oral)   Resp 16   Wt 57.2 kg   LMP 01/19/2011   SpO2 100%   BMI 20.35 kg/m   Physical Exam Vitals signs and nursing note  reviewed.  Constitutional:      Appearance: She is well-developed.  HENT:     Head: Normocephalic and atraumatic.     Comments: 1 cm laceration to the right upper lip, 3 stitches in place, no surrounding swelling, wound appears to be healing well, no discharge Eyes:     General: No scleral icterus.       Right eye: No discharge.        Left eye: No discharge.     Conjunctiva/sclera: Conjunctivae normal.     Pupils: Pupils are equal, round, and reactive to light.  Neck:     Musculoskeletal: Normal range of motion.     Vascular: No JVD.     Trachea: No tracheal deviation.  Pulmonary:     Effort: Pulmonary effort is normal.     Breath sounds: No stridor.  Neurological:     Mental Status: She is alert and oriented to person, place, and time.     Coordination: Coordination normal.  Psychiatric:        Behavior: Behavior normal.        Thought Content: Thought content normal.        Judgment: Judgment normal.      ED Treatments / Results  Labs (all labs ordered are  listed, but only abnormal results are displayed) Labs Reviewed - No data to display  EKG None  Radiology No results found.  Procedures .Suture Removal  Date/Time: 09/08/2018 2:47 PM Performed by: Okey Regal, PA-C Authorized by: Okey Regal, PA-C   Consent:    Consent obtained:  Verbal   Consent given by:  Patient   Risks discussed:  Pain, bleeding and wound separation   Alternatives discussed:  No treatment, delayed treatment and alternative treatment Location:    Location:  Head/neck   Head/neck location: upper lip. Procedure details:    Wound appearance:  No signs of infection, good wound healing and clean   Number of sutures removed:  3 Post-procedure details:    Post-removal:  No dressing applied   Patient tolerance of procedure:  Tolerated well, no immediate complications   (including critical care time)  Medications Ordered in ED Medications - No data to display   Initial  Impression / Assessment and Plan / ED Course  I have reviewed the triage vital signs and the nursing notes.  Pertinent labs & imaging results that were available during my care of the patient were reviewed by me and considered in my medical decision making (see chart for details).        58 year old here for suture removal.  Sutures removed without complication.  Return precautions given.  Final Clinical Impressions(s) / ED Diagnoses   Final diagnoses:  Visit for suture removal    ED Discharge Orders    None       Francee Gentile 09/08/18 1448    Drenda Freeze, MD 09/09/18 1128

## 2019-02-09 ENCOUNTER — Other Ambulatory Visit: Payer: Self-pay

## 2019-02-09 DIAGNOSIS — Z20822 Contact with and (suspected) exposure to covid-19: Secondary | ICD-10-CM

## 2019-02-12 LAB — NOVEL CORONAVIRUS, NAA: SARS-CoV-2, NAA: NOT DETECTED

## 2019-02-27 ENCOUNTER — Telehealth: Payer: Self-pay

## 2019-02-27 NOTE — Telephone Encounter (Signed)
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 Texted MyChart signup link.

## 2019-04-03 ENCOUNTER — Emergency Department (HOSPITAL_COMMUNITY): Payer: Self-pay

## 2019-04-03 ENCOUNTER — Encounter (HOSPITAL_COMMUNITY): Payer: Self-pay

## 2019-04-03 ENCOUNTER — Inpatient Hospital Stay (HOSPITAL_COMMUNITY)
Admission: EM | Admit: 2019-04-03 | Discharge: 2019-04-05 | DRG: 193 | Disposition: A | Discharge status: Home Health | Payer: Self-pay | Attending: Family Medicine | Admitting: Family Medicine

## 2019-04-03 ENCOUNTER — Other Ambulatory Visit: Payer: Self-pay

## 2019-04-03 DIAGNOSIS — M546 Pain in thoracic spine: Secondary | ICD-10-CM | POA: Diagnosis present

## 2019-04-03 DIAGNOSIS — J439 Emphysema, unspecified: Secondary | ICD-10-CM | POA: Diagnosis present

## 2019-04-03 DIAGNOSIS — F329 Major depressive disorder, single episode, unspecified: Secondary | ICD-10-CM | POA: Diagnosis present

## 2019-04-03 DIAGNOSIS — W08XXXA Fall from other furniture, initial encounter: Secondary | ICD-10-CM | POA: Diagnosis present

## 2019-04-03 DIAGNOSIS — J188 Other pneumonia, unspecified organism: Secondary | ICD-10-CM | POA: Insufficient documentation

## 2019-04-03 DIAGNOSIS — Z114 Encounter for screening for human immunodeficiency virus [HIV]: Secondary | ICD-10-CM

## 2019-04-03 DIAGNOSIS — Y92009 Unspecified place in unspecified non-institutional (private) residence as the place of occurrence of the external cause: Secondary | ICD-10-CM

## 2019-04-03 DIAGNOSIS — J9600 Acute respiratory failure, unspecified whether with hypoxia or hypercapnia: Secondary | ICD-10-CM | POA: Diagnosis present

## 2019-04-03 DIAGNOSIS — Z21 Asymptomatic human immunodeficiency virus [HIV] infection status: Secondary | ICD-10-CM

## 2019-04-03 DIAGNOSIS — R61 Generalized hyperhidrosis: Secondary | ICD-10-CM | POA: Diagnosis present

## 2019-04-03 DIAGNOSIS — R0781 Pleurodynia: Secondary | ICD-10-CM | POA: Diagnosis present

## 2019-04-03 DIAGNOSIS — Z85118 Personal history of other malignant neoplasm of bronchus and lung: Secondary | ICD-10-CM

## 2019-04-03 DIAGNOSIS — Z20822 Contact with and (suspected) exposure to covid-19: Secondary | ICD-10-CM | POA: Diagnosis present

## 2019-04-03 DIAGNOSIS — Z803 Family history of malignant neoplasm of breast: Secondary | ICD-10-CM

## 2019-04-03 DIAGNOSIS — Z9889 Other specified postprocedural states: Secondary | ICD-10-CM

## 2019-04-03 DIAGNOSIS — J189 Pneumonia, unspecified organism: Principal | ICD-10-CM | POA: Diagnosis present

## 2019-04-03 DIAGNOSIS — Z79899 Other long term (current) drug therapy: Secondary | ICD-10-CM

## 2019-04-03 DIAGNOSIS — Z780 Asymptomatic menopausal state: Secondary | ICD-10-CM

## 2019-04-03 DIAGNOSIS — F17210 Nicotine dependence, cigarettes, uncomplicated: Secondary | ICD-10-CM | POA: Diagnosis present

## 2019-04-03 DIAGNOSIS — F1010 Alcohol abuse, uncomplicated: Secondary | ICD-10-CM

## 2019-04-03 DIAGNOSIS — J9601 Acute respiratory failure with hypoxia: Secondary | ICD-10-CM | POA: Diagnosis present

## 2019-04-03 DIAGNOSIS — Z7982 Long term (current) use of aspirin: Secondary | ICD-10-CM

## 2019-04-03 DIAGNOSIS — R9389 Abnormal findings on diagnostic imaging of other specified body structures: Secondary | ICD-10-CM

## 2019-04-03 DIAGNOSIS — K219 Gastro-esophageal reflux disease without esophagitis: Secondary | ICD-10-CM | POA: Diagnosis present

## 2019-04-03 DIAGNOSIS — R918 Other nonspecific abnormal finding of lung field: Secondary | ICD-10-CM | POA: Diagnosis present

## 2019-04-03 DIAGNOSIS — Z419 Encounter for procedure for purposes other than remedying health state, unspecified: Secondary | ICD-10-CM

## 2019-04-03 LAB — CBC WITH DIFFERENTIAL/PLATELET
Abs Immature Granulocytes: 0.01 10*3/uL (ref 0.00–0.07)
Basophils Absolute: 0 10*3/uL (ref 0.0–0.1)
Basophils Relative: 0 %
Eosinophils Absolute: 0 10*3/uL (ref 0.0–0.5)
Eosinophils Relative: 0 %
HCT: 40.5 % (ref 36.0–46.0)
Hemoglobin: 14 g/dL (ref 12.0–15.0)
Immature Granulocytes: 0 %
Lymphocytes Relative: 11 %
Lymphs Abs: 0.8 10*3/uL (ref 0.7–4.0)
MCH: 33.1 pg (ref 26.0–34.0)
MCHC: 34.6 g/dL (ref 30.0–36.0)
MCV: 95.7 fL (ref 80.0–100.0)
Monocytes Absolute: 0.5 10*3/uL (ref 0.1–1.0)
Monocytes Relative: 7 %
Neutro Abs: 5.7 10*3/uL (ref 1.7–7.7)
Neutrophils Relative %: 82 %
Platelets: 239 10*3/uL (ref 150–400)
RBC: 4.23 MIL/uL (ref 3.87–5.11)
RDW: 13.3 % (ref 11.5–15.5)
WBC: 7 10*3/uL (ref 4.0–10.5)
nRBC: 0 % (ref 0.0–0.2)

## 2019-04-03 LAB — MAGNESIUM: Magnesium: 1.8 mg/dL (ref 1.7–2.4)

## 2019-04-03 LAB — RESPIRATORY PANEL BY RT PCR (FLU A&B, COVID)
Influenza A by PCR: NEGATIVE
Influenza B by PCR: NEGATIVE
SARS Coronavirus 2 by RT PCR: NEGATIVE

## 2019-04-03 LAB — BASIC METABOLIC PANEL
Anion gap: 10 (ref 5–15)
BUN: <5 mg/dL — ABNORMAL LOW (ref 6–20)
CO2: 26 mmol/L (ref 22–32)
Calcium: 8.9 mg/dL (ref 8.9–10.3)
Chloride: 99 mmol/L (ref 98–111)
Creatinine, Ser: 0.54 mg/dL (ref 0.44–1.00)
GFR calc Af Amer: >60 mL/min (ref >60)
GFR calc non Af Amer: >60 mL/min (ref >60)
Glucose, Bld: 116 mg/dL — ABNORMAL HIGH (ref 70–99)
Potassium: 3.4 mmol/L — ABNORMAL LOW (ref 3.5–5.1)
Sodium: 135 mmol/L (ref 135–145)

## 2019-04-03 LAB — HIV ANTIBODY (ROUTINE TESTING W REFLEX): HIV Screen 4th Generation wRfx: REACTIVE — AB

## 2019-04-03 LAB — PHOSPHORUS: Phosphorus: 2.6 mg/dL (ref 2.5–4.6)

## 2019-04-03 LAB — POC SARS CORONAVIRUS 2 AG -  ED: SARS Coronavirus 2 Ag: NEGATIVE

## 2019-04-03 LAB — PROCALCITONIN: Procalcitonin: <0.1 ng/mL

## 2019-04-03 IMAGING — DX DG RIBS W/ CHEST 3+V*R*
3 series · 3 of 3 positions shown · non-contrast
Comparison: [DATE].

CLINICAL DATA: Right rib pain after fall yesterday.

EXAM:
RIGHT RIBS AND CHEST - 3+ VIEW

[rib ap (1 of 2)]
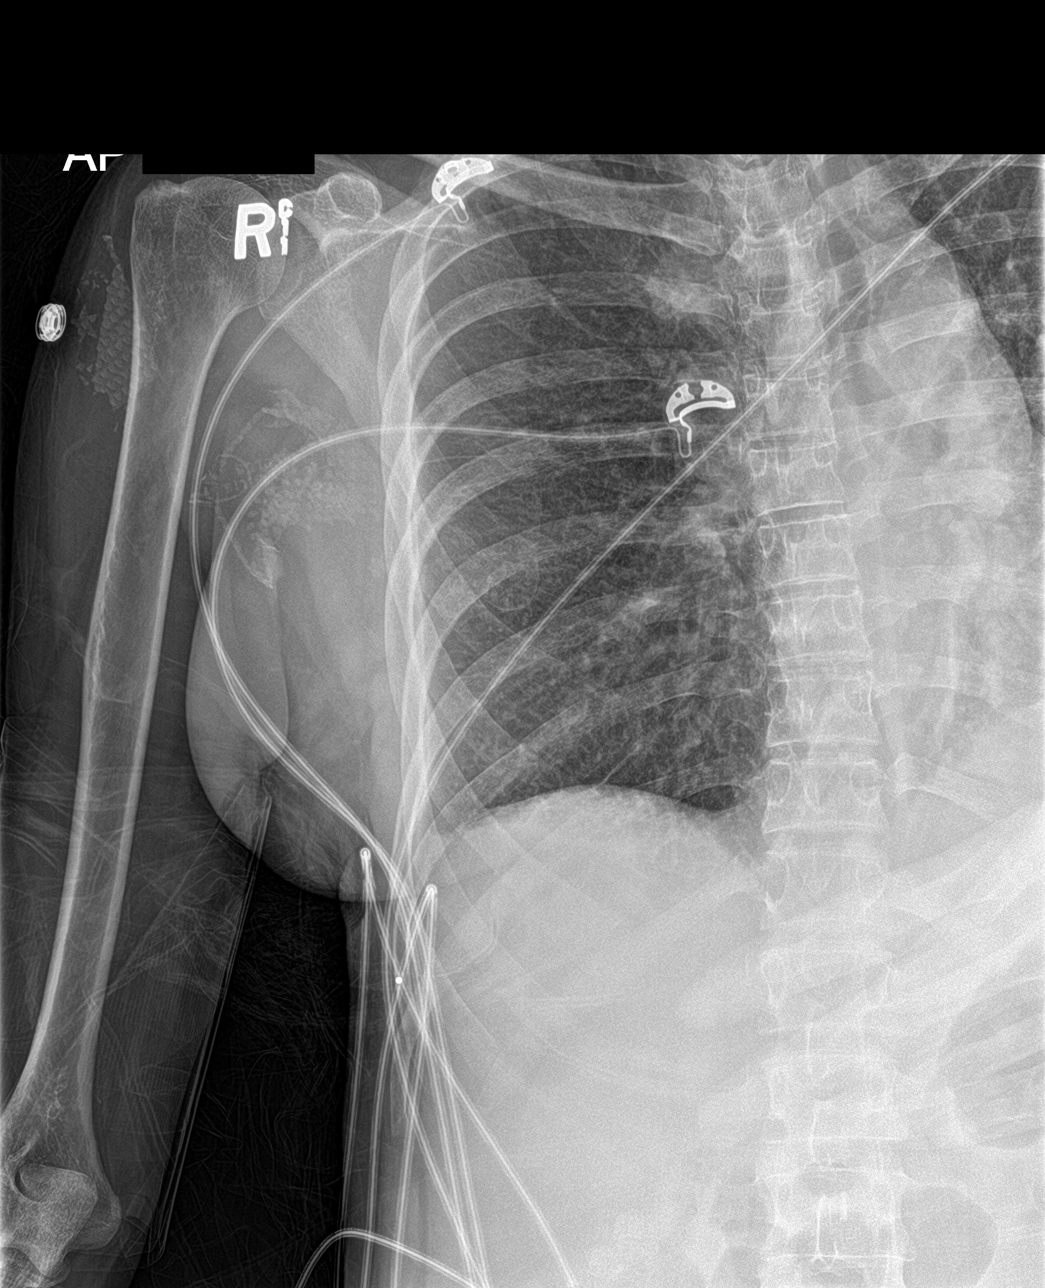

[chest ap]
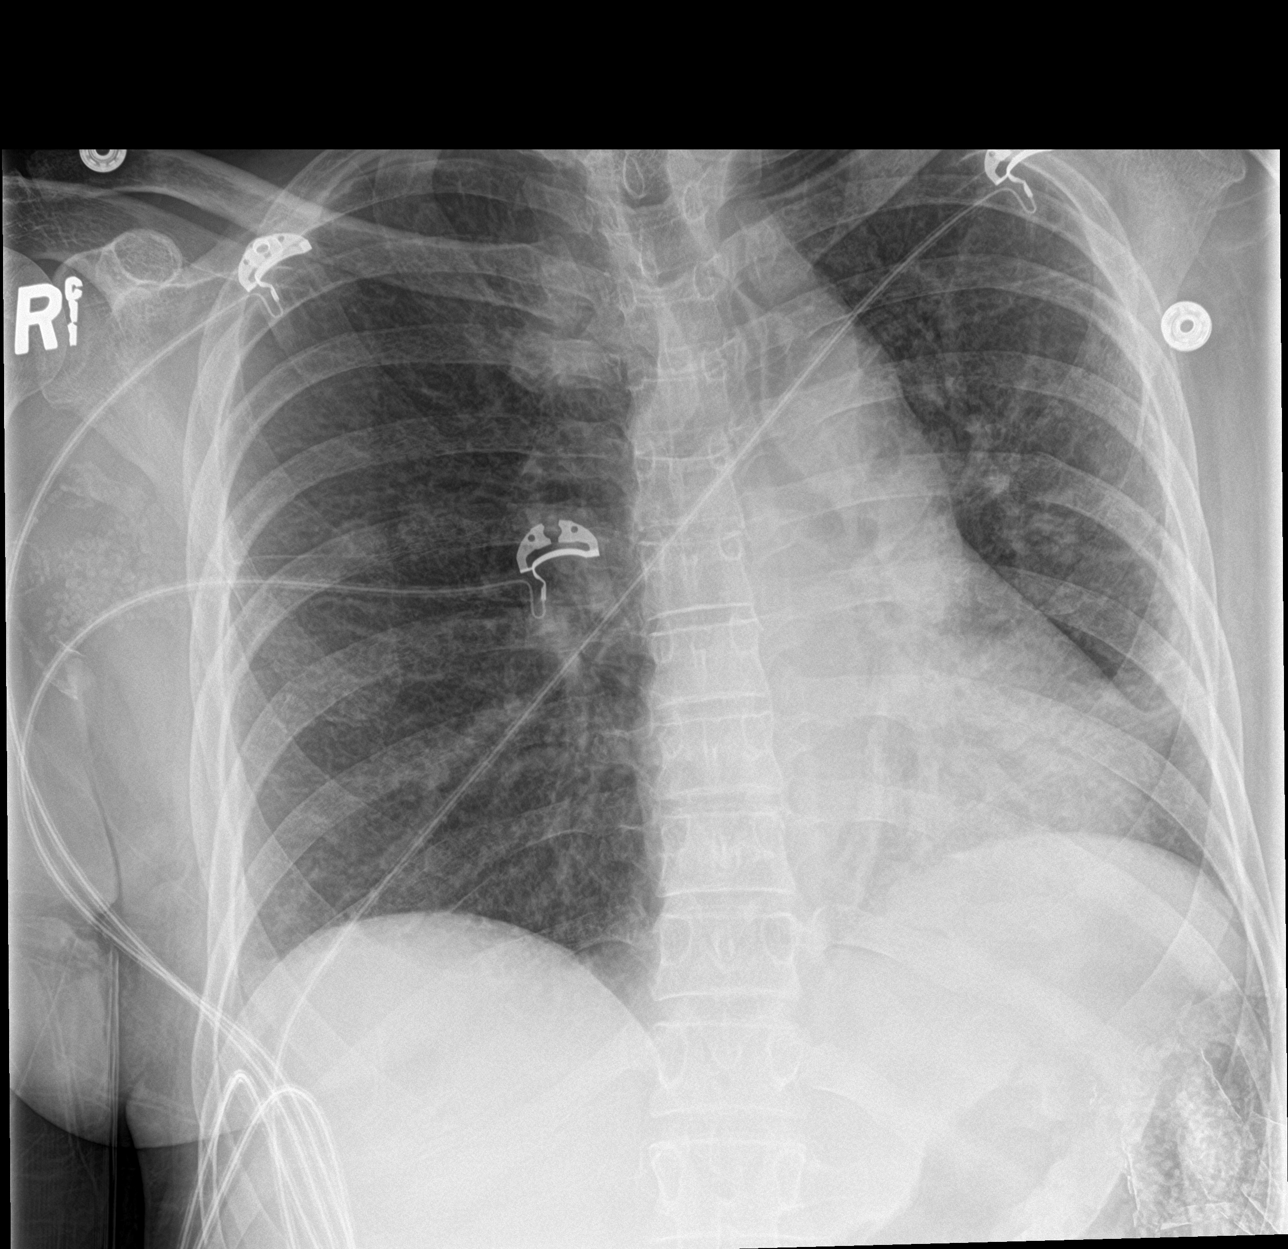

[rib ap (2 of 2)]
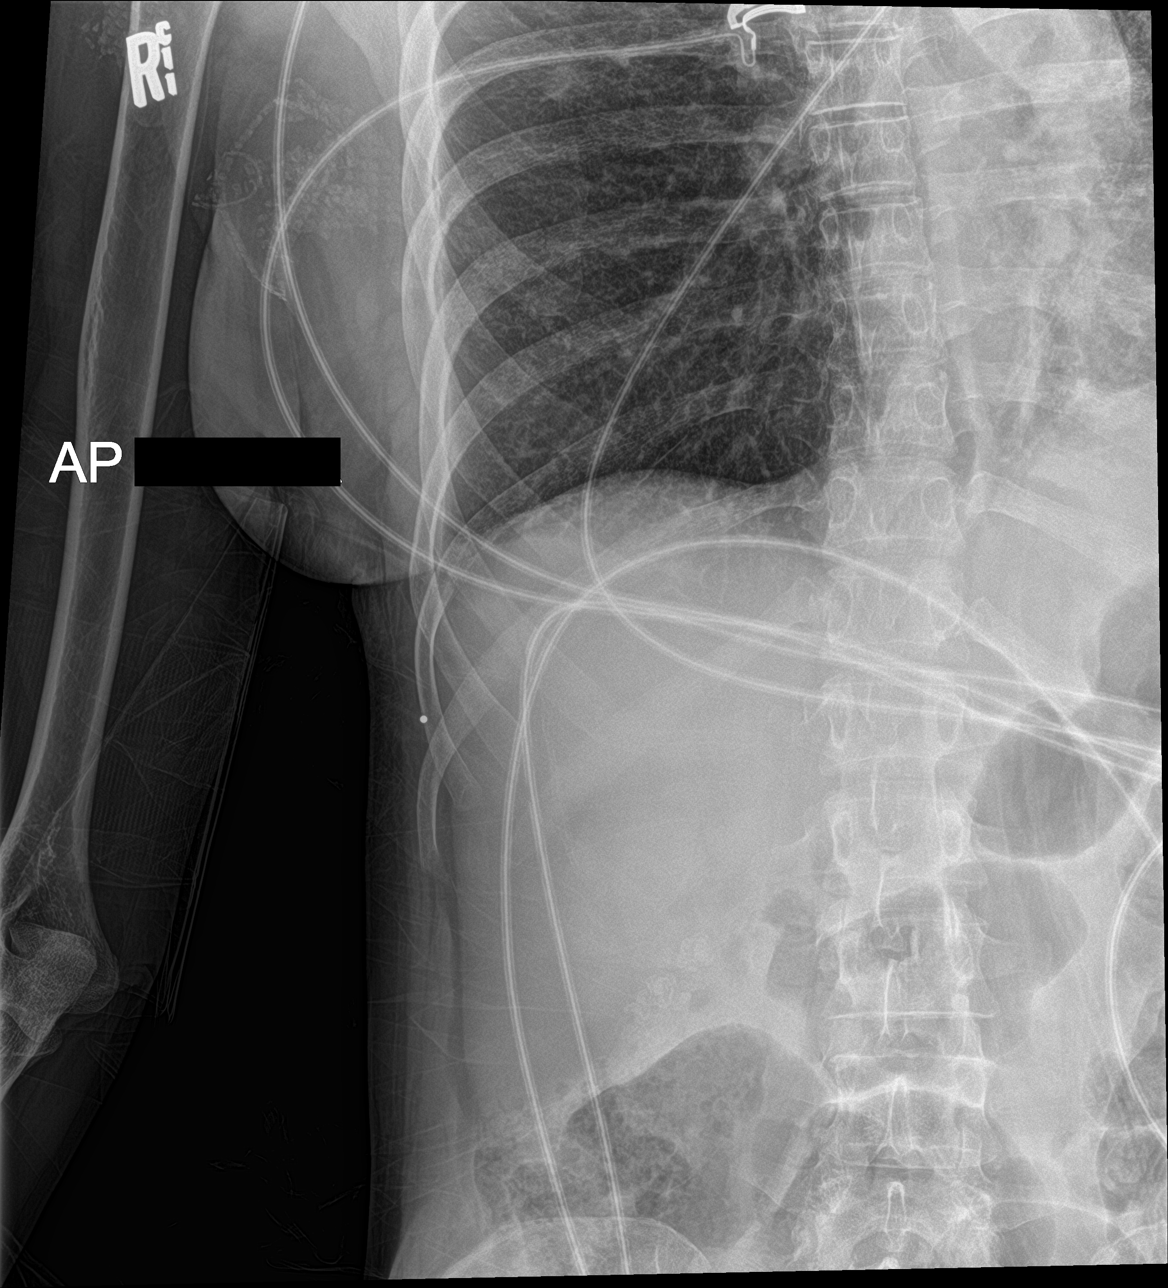

[3 of 3 positions shown; findings below may reference images not displayed]

FINDINGS: No fracture or other bone lesions are seen involving the ribs. There
is no evidence of pneumothorax or pleural effusion. Left lung is
clear. Rounded density is seen medially in right upper lobe which
was not present on prior exam. Heart size and mediastinal contours
are within normal limits.
IMPRESSION: There is no definite evidence of right rib fracture. Possible
rounded density is noted medially in right upper lobe which was not
present on prior exam; CT scan of the chest is recommended to rule
out nodule or neoplasm.

## 2019-04-03 IMAGING — CT CT ANGIO CHEST
2 of 6 series · 19 of 36 positions shown · IV contrast (omnipaque)
Comparison: Radiograph of same day.

CLINICAL DATA: Chest injury after fall yesterday.

EXAM:
CT ANGIOGRAPHY CHEST WITH CONTRAST
TECHNIQUE: Multidetector CT imaging of the chest was performed using the
standard protocol during bolus administration of intravenous
contrast. Multiplanar CT image reconstructions and MIPs were
obtained to evaluate the vascular anatomy.
CONTRAST:  80mL OMNIPAQUE IOHEXOL 350 MG/ML SOLN

[Series 7: pe thins · axial · 0.65mm/px · z∈[+1132,+1409]mm · 18 of 442 slices shown]
[im 23/442  lung]
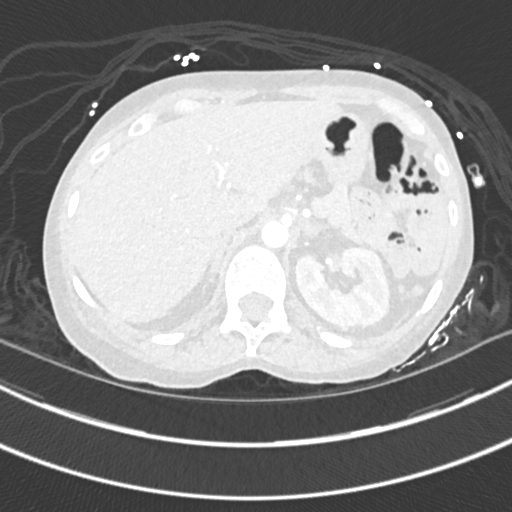
[im 45/442  mediastinal]
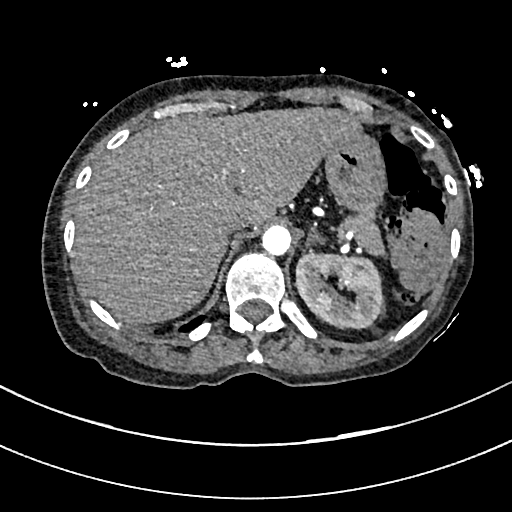
[im 67/442  lung]
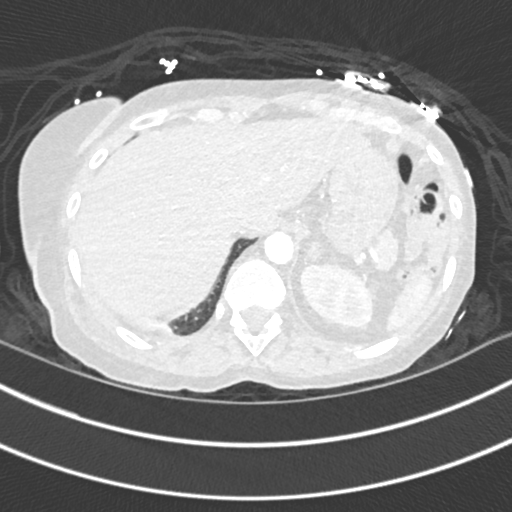
[im 89/442  mediastinal]
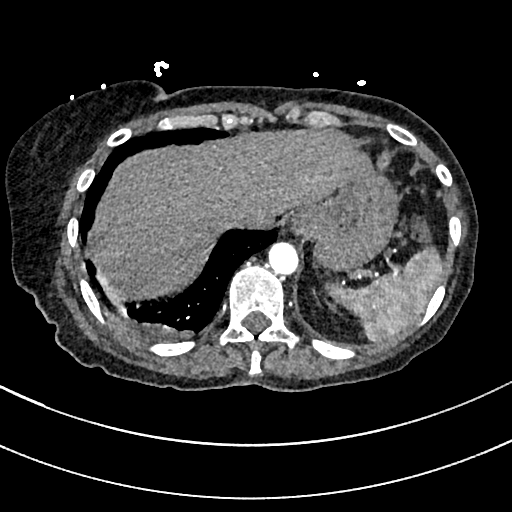
[im 111/442  lung]
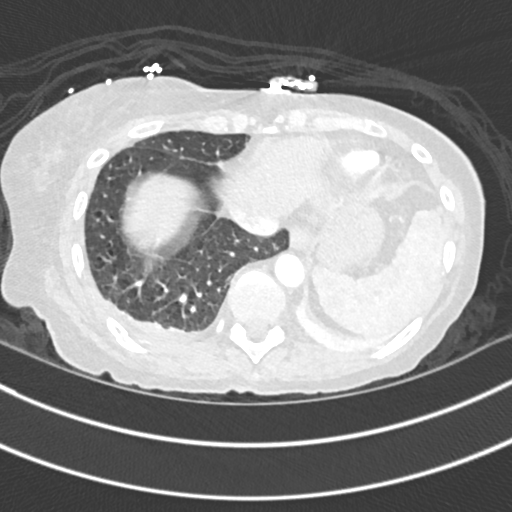
[im 133/442  mediastinal]
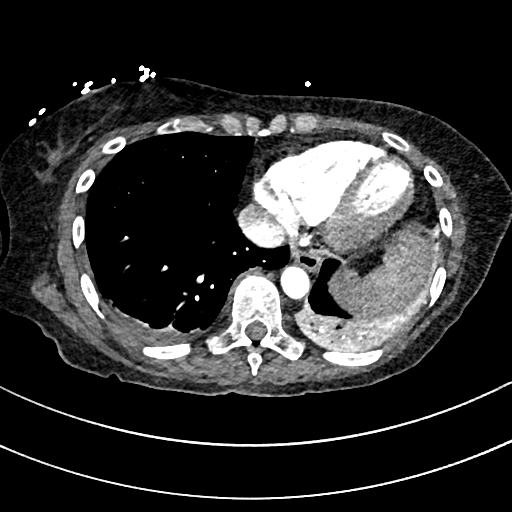
[im 155/442  lung]
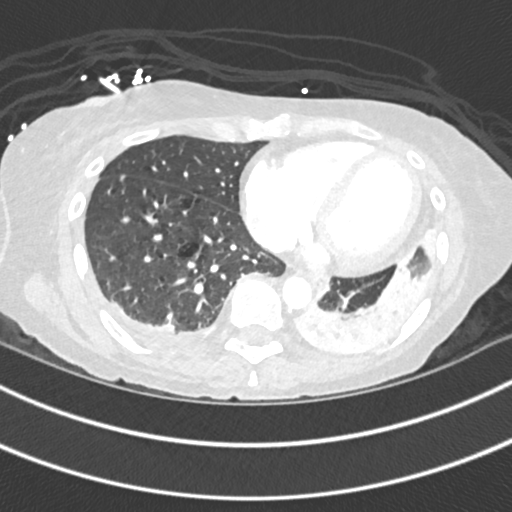
[im 177/442  mediastinal]
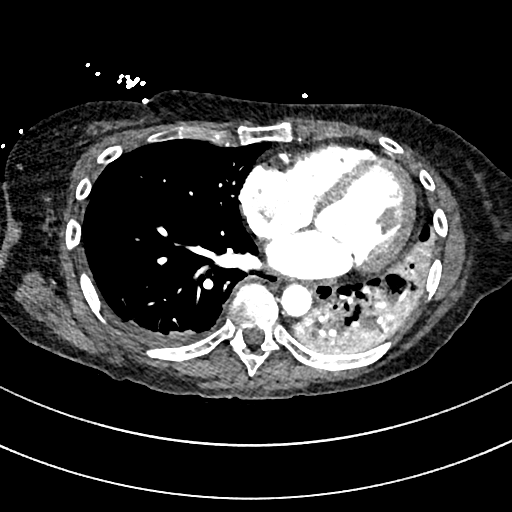
[im 199/442  lung]
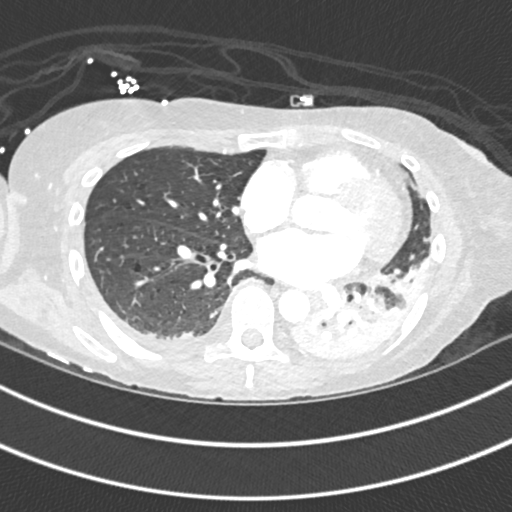
[im 243/442  mediastinal]
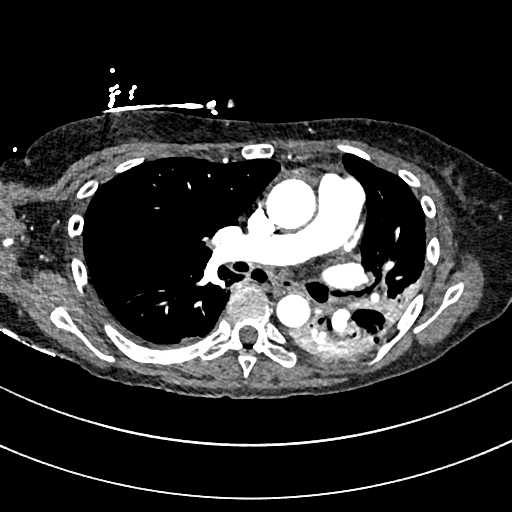
[im 265/442  lung]
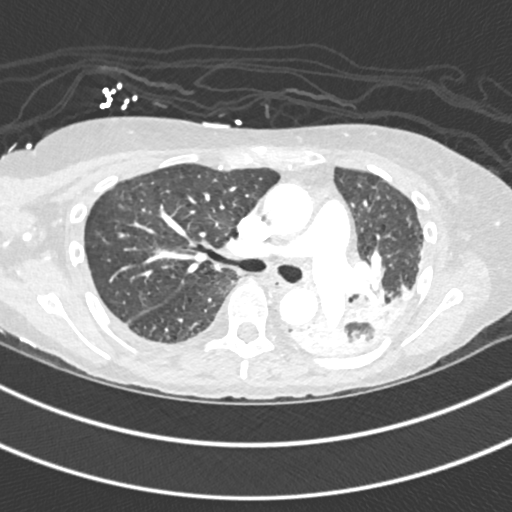
[im 287/442  mediastinal]
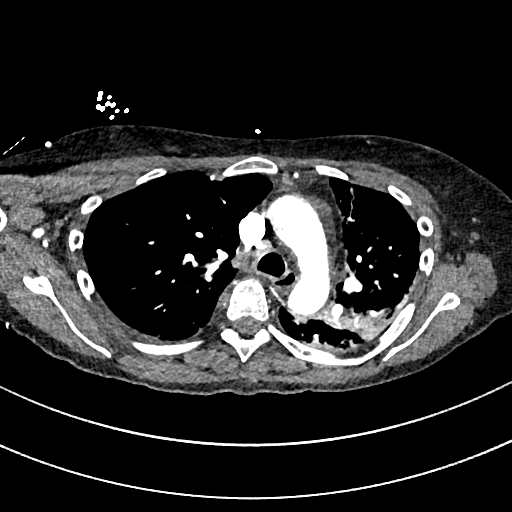
[im 309/442  lung]
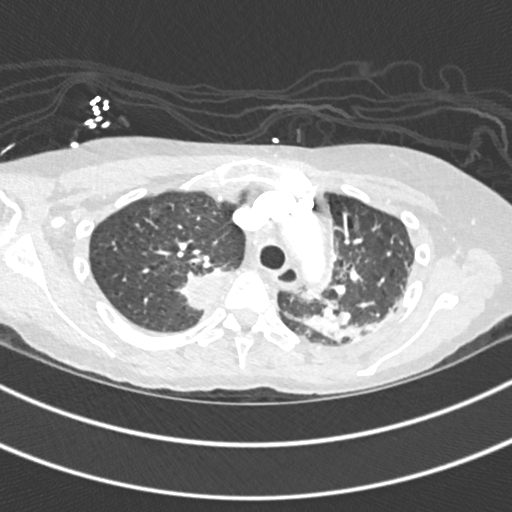
[im 331/442  mediastinal]
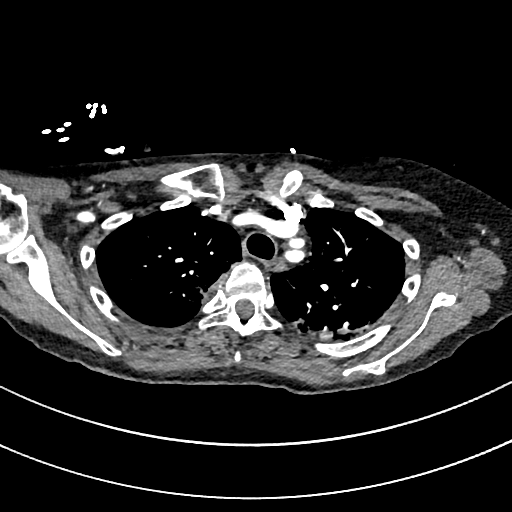
[im 353/442  lung]
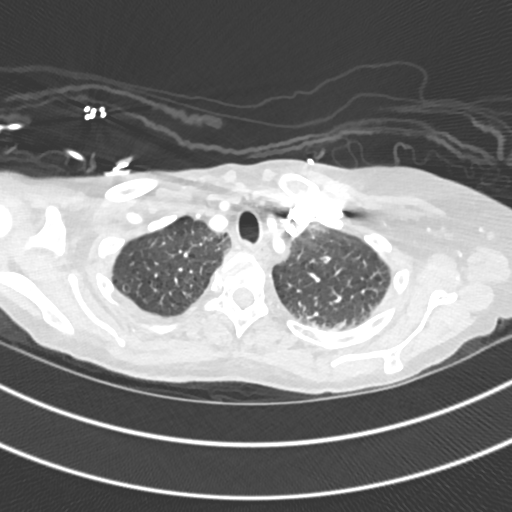
[im 375/442  mediastinal]
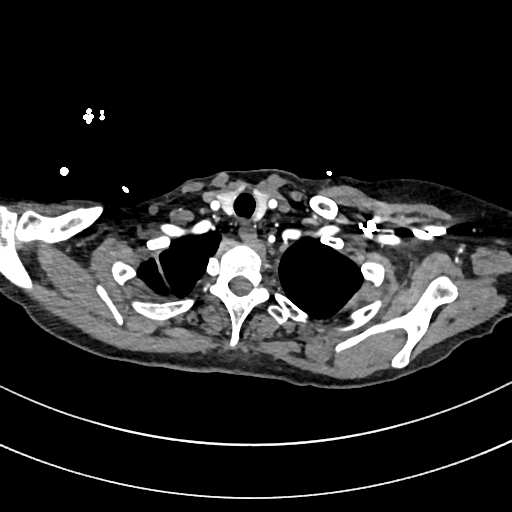
[im 397/442  lung]
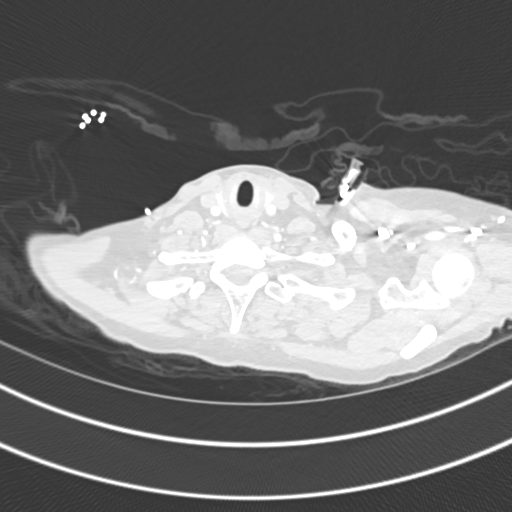
[im 419/442  mediastinal]
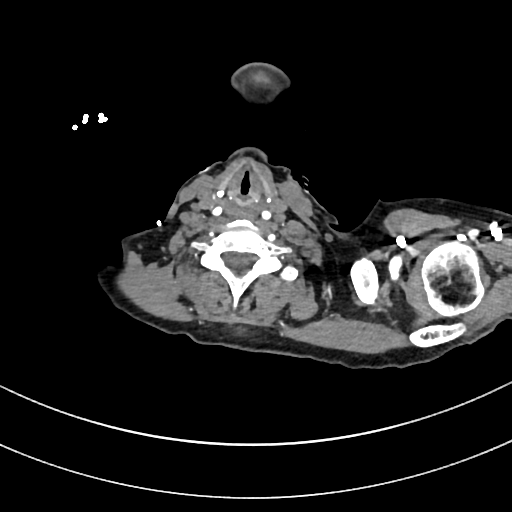

[Series 8: pe 2mm cor · coronal · 0.61mm/px · 1 of 106 slices shown]
[im 53/106  mediastinal]
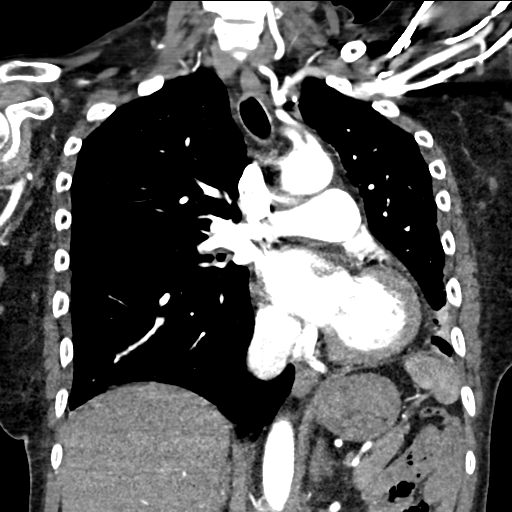

[19 of 36 positions shown; findings below may reference images not displayed]

FINDINGS: Cardiovascular: Preferential opacification of the thoracic aorta. No
evidence of thoracic aortic aneurysm or dissection. Normal heart
size. No pericardial effusion.

Mediastinum/Nodes: No enlarged mediastinal, hilar, or axillary lymph
nodes. Thyroid gland, trachea, and esophagus demonstrate no
significant findings.

Lungs/Pleura: No pneumothorax is noted. Left upper and lower lobe
opacities are noted concerning for atelectasis or pneumonia. Minimal
right posterior basilar subsegmental atelectasis is noted. 3.1 x
cm pleural based mass is noted medially in the right upper lobe
highly concerning for malignancy. This is best seen on image number
46 of series [DATE] x 12 mm nodule is noted in right upper lobe
concerning for malignancy. Mild emphysematous disease is noted in
both upper lobes.

Upper Abdomen: No acute abnormality.

Musculoskeletal: No chest wall abnormality. No acute or significant
osseous findings.

Review of the MIP images confirms the above findings.
IMPRESSION: No evidence of thoracic aortic dissection or aneurysm.

3.1 x 1.9 cm pleural based mass is noted medially in the right upper
lobe highly concerning for malignancy. Also noted is 1.4 x 1.2 cm
nodule in the right upper lobe concerning for malignancy or
metastatic disease. PET scan is recommended for further evaluation.

Left upper and lower lobe airspace opacities are noted concerning
for pneumonia or atelectasis.

Emphysema ([LZ]-[LZ]).

## 2019-04-03 MED ORDER — ACETAMINOPHEN 650 MG RE SUPP: 650.0000 mg | Freq: Four times a day (QID) | RECTAL | Status: DC | PRN

## 2019-04-03 MED ORDER — FOLIC ACID 1 MG PO TABS
1.0000 mg | ORAL_TABLET | Freq: Every day | ORAL | Status: DC
  Administered 2019-04-03 – 2019-04-05 (×3): 1 mg via ORAL
  Filled 2019-04-03 (×3): qty 1

## 2019-04-03 MED ORDER — SODIUM CHLORIDE 0.9 % IV SOLN
1.0000 g | Freq: Once | INTRAVENOUS | Status: AC
  Administered 2019-04-03: 17:00:00 1 g via INTRAVENOUS
  Filled 2019-04-03: qty 10

## 2019-04-03 MED ORDER — SODIUM CHLORIDE 0.9 % IV SOLN
500.0000 mg | Freq: Once | INTRAVENOUS | Status: AC
  Administered 2019-04-03: 15:00:00 500 mg via INTRAVENOUS
  Filled 2019-04-03: qty 500

## 2019-04-03 MED ORDER — HYDROMORPHONE HCL 1 MG/ML IJ SOLN
0.5000 mg | Freq: Once | INTRAMUSCULAR | Status: AC
  Administered 2019-04-03: 08:00:00 0.5 mg via INTRAVENOUS
  Filled 2019-04-03: qty 1

## 2019-04-03 MED ORDER — ENOXAPARIN SODIUM 40 MG/0.4ML ~~LOC~~ SOLN: 40.0000 mg | SUBCUTANEOUS | Status: DC

## 2019-04-03 MED ORDER — THIAMINE HCL 100 MG/ML IJ SOLN: 100.0000 mg | Freq: Every day | INTRAMUSCULAR | Status: DC

## 2019-04-03 MED ORDER — ADULT MULTIVITAMIN W/MINERALS CH
1.0000 | ORAL_TABLET | Freq: Every day | ORAL | Status: DC
  Administered 2019-04-03 – 2019-04-05 (×3): 1 via ORAL
  Filled 2019-04-03 (×3): qty 1

## 2019-04-03 MED ORDER — OXYCODONE HCL 5 MG PO TABS
5.0000 mg | ORAL_TABLET | Freq: Four times a day (QID) | ORAL | Status: DC | PRN
  Administered 2019-04-03: 21:00:00 5 mg via ORAL
  Filled 2019-04-03: qty 1

## 2019-04-03 MED ORDER — ACETAMINOPHEN 325 MG PO TABS
650.0000 mg | ORAL_TABLET | Freq: Four times a day (QID) | ORAL | Status: DC
  Administered 2019-04-03 – 2019-04-05 (×5): 650 mg via ORAL
  Filled 2019-04-03 (×6): qty 2

## 2019-04-03 MED ORDER — ENOXAPARIN SODIUM 40 MG/0.4ML ~~LOC~~ SOLN
40.0000 mg | SUBCUTANEOUS | Status: DC
  Administered 2019-04-05: 11:00:00 40 mg via SUBCUTANEOUS
  Filled 2019-04-03: qty 0.4

## 2019-04-03 MED ORDER — THIAMINE HCL 100 MG PO TABS
100.0000 mg | ORAL_TABLET | Freq: Every day | ORAL | Status: DC
  Administered 2019-04-03 – 2019-04-05 (×3): 100 mg via ORAL
  Filled 2019-04-03 (×3): qty 1

## 2019-04-03 MED ORDER — ACETAMINOPHEN 325 MG PO TABS: 650.0000 mg | ORAL_TABLET | Freq: Four times a day (QID) | ORAL | Status: DC | PRN

## 2019-04-03 MED ORDER — IOHEXOL 350 MG/ML SOLN
80.0000 mL | Freq: Once | INTRAVENOUS | Status: AC | PRN
  Administered 2019-04-03: 11:00:00 80 mL via INTRAVENOUS

## 2019-04-03 MED ORDER — DIAZEPAM 2 MG PO TABS: 2.0000 mg | ORAL_TABLET | Freq: Four times a day (QID) | ORAL | Status: DC | PRN

## 2019-04-03 MED ORDER — KETOROLAC TROMETHAMINE 30 MG/ML IJ SOLN
30.0000 mg | Freq: Four times a day (QID) | INTRAMUSCULAR | Status: DC
  Administered 2019-04-03 – 2019-04-05 (×8): 30 mg via INTRAVENOUS
  Filled 2019-04-03 (×8): qty 1

## 2019-04-03 MED ORDER — FENTANYL CITRATE (PF) 100 MCG/2ML IJ SOLN
50.0000 ug | INTRAMUSCULAR | Status: DC | PRN
  Administered 2019-04-03 – 2019-04-04 (×2): 50 ug via INTRAVENOUS
  Filled 2019-04-03 (×2): qty 2

## 2019-04-03 MED ORDER — HYDROMORPHONE HCL 1 MG/ML IJ SOLN
1.0000 mg | Freq: Once | INTRAMUSCULAR | Status: AC
  Administered 2019-04-03: 09:00:00 1 mg via INTRAVENOUS
  Filled 2019-04-03: qty 1
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
## 2019-04-03 NOTE — ED Provider Notes (Signed)
[facility] EMERGENCY DEPARTMENT Provider Note   CSN: [id number] Arrival date & time: 04/03/19  5643     History Chief Complaint  Patient presents with  . Shortness of Breath    Brenda [name] is a 59 y.o. female with history of major depressive disorder, alcohol abuse presents for evaluation of acute onset, progressively worsening right-sided thoracic back pains and shortness of breath secondary to fall yesterday.  She reports that she was asleep on her couch and then when she stood up while still "half asleep" she fell landing with the right side of her body on the wooden armrests of the couch.  No head injury or loss of consciousness.  Denies any prodrome leading up to the fall and thinks that it was a result of still being drowsy after awakening from her nap.  Denies any headache, vision changes, numbness or weakness of the extremities.  Woke up this morning feeling extremely short of breath so she called EMS.  She has had a cough but reports this is baseline for her and she notes she smokes a third of a pack of cigarettes daily.  Denies any fever or known sick contacts, no known Covid exposures.  She denies abdominal pain, nausea, vomiting, fevers.  Pain is sharp, intermittent spasming, worsens with any movement or deep inspiration.  She was found to be hypoxic to 87% on room air with EMS was placed on 10 L nonrebreather came up to 98%.  She does tell me for the last month she has had progressively worsening dyspnea on exertion.  The history is provided by the patient.       Past Medical History:  Diagnosis Date  . Depression   . Mental disorder     Patient Active Problem List   Diagnosis Date Noted  . Major depressive disorder, recurrent episode with mood-congruent psychotic features ([place]) 05/30/2017  . 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 TUBAL LIGATION         OB History   No obstetric history on file.     No family history on file.  Social History   Tobacco Use  . Smoking status: Current Every Day Smoker    Packs/day: 0.50    Years: 30.00    Pack years: 15.00    Types: Cigarettes  . Smokeless tobacco: Never Used  Substance Use Topics  . Alcohol use: Yes  . Drug use: Never    Home Medications Prior to Admission medications   Medication Sig Start Date End Date Taking? Authorizing Provider  famotidine (PEPCID) 20 MG tablet Take 1 tablet (20 mg total) by mouth 2 (two) times daily as needed for heartburn or indigestion. 06/02/17   Lindell Spar I, NP    Allergies    Aspirin adult low [aspirin]  Review of Systems   Review of Systems  Constitutional: Negative for chills and fever.  Respiratory: Positive for cough and shortness of breath.   Cardiovascular: Positive for chest pain.  Gastrointestinal: Negative for abdominal pain, nausea and vomiting.  Musculoskeletal: Positive for back pain.  Neurological: Negative for syncope, weakness, numbness and headaches.  All other systems reviewed and are negative.   Physical Exam Updated Vital Signs BP 120/71   Pulse 77   Temp 98 F (36.7 C) (Oral)   Resp (!) 21   Ht 5\' 6"  (1.676 m)   Wt 57.2 kg   LMP 01/19/2011   SpO2  97%   BMI 20.34 kg/m   Physical Exam Vitals and nursing note reviewed.  Constitutional:      Appearance: She is well-developed and normal weight.     Comments: Appears uncomfortable, occasionally crying out in pain  HENT:     Head: Normocephalic and atraumatic.  Eyes:     General:        Right eye: No discharge.        Left eye: No discharge.     Conjunctiva/sclera: Conjunctivae normal.  Neck:     Vascular: No JVD.     Trachea: No tracheal deviation.  Cardiovascular:     Rate and Rhythm: Normal rate and regular rhythm.     Pulses: Normal pulses.  Pulmonary:     Effort: Tachypnea present.     Breath sounds: Rhonchi present.     Comments: Diffuse  rhonchi, speaking in short phrases SPO2 saturations 94 to 96% on 5 L supplemental oxygen via nasal cannula Chest:     Chest wall: Tenderness present.       Comments: No deformity, crepitus, ecchymosis or flail segment. Abdominal:     General: Bowel sounds are normal. There is no distension.     Palpations: Abdomen is soft.     Tenderness: There is no abdominal tenderness. There is no guarding or rebound.     Comments: No ecchymosis.  Hernia palpable in the right upper quadrant/epigastrium but easily reducible.  Patient reports this has been present for a long time  Musculoskeletal:     Cervical back: Normal range of motion and neck supple.     Thoracic back: Bony tenderness present.       Back:     Right lower leg: No tenderness. No edema.     Left lower leg: No tenderness. No edema.     Comments: No midline lumbar spine tenderness.  Moving all extremities spontaneously without difficulty.  Pelvis appears stable on palpation.  5/5 strength of BUE and BLE major muscle groups.  Skin:    General: Skin is warm and dry.     Findings: No erythema.  Neurological:     Mental Status: She is alert.  Psychiatric:        Behavior: Behavior normal.     ED Results / Procedures / Treatments   Labs (all labs ordered are listed, but only abnormal results are displayed) Labs Reviewed  BASIC METABOLIC PANEL - Abnormal; Notable for the following components:      Result Value   Potassium 3.4 (*)    Glucose, Bld 116 (*)    BUN <5 (*)    All other components within normal limits  RESPIRATORY PANEL BY RT PCR (FLU A&B, COVID)  CBC WITH DIFFERENTIAL/PLATELET  POC SARS CORONAVIRUS 2 AG -  ED    EKG EKG Interpretation  Date/Time:  Monday April 03 2019 07:45:39 EST Ventricular Rate:  84 PR Interval:    QRS Duration: 89 QT Interval:  384 QTC Calculation: 454 R Axis:   29 Text Interpretation: Sinus rhythm Ventricular premature complex Low voltage, extremity leads No old tracing to compare  Confirmed by Deno Etienne [phone]) on 04/03/2019 8:02:57 AM   Radiology DG Ribs Unilateral W/Chest Right  Result Date: 04/03/2019 CLINICAL DATA:  Right rib pain after fall yesterday. EXAM: RIGHT RIBS AND CHEST - 3+ VIEW COMPARISON:  May 25, 2012. FINDINGS: No fracture or other bone lesions are seen involving the ribs. There is no evidence of pneumothorax or pleural effusion. Left lung is  clear. Rounded density is seen medially in right upper lobe which was not present on prior exam. Heart size and mediastinal contours are within normal limits. IMPRESSION: There is no definite evidence of right rib fracture. Possible rounded density is noted medially in right upper lobe which was not present on prior exam; CT scan of the chest is recommended to rule out nodule or neoplasm. Electronically Signed   By: Marijo Conception M.D.   On: 04/03/2019 07:42   CT Angio Chest PE W and/or Wo Contrast  Result Date: 04/03/2019 CLINICAL DATA:  Chest injury after fall yesterday. EXAM: CT ANGIOGRAPHY CHEST WITH CONTRAST TECHNIQUE: Multidetector CT imaging of the chest was performed using the standard protocol during bolus administration of intravenous contrast. Multiplanar CT image reconstructions and MIPs were obtained to evaluate the vascular anatomy. CONTRAST:  102mL OMNIPAQUE IOHEXOL 350 MG/ML SOLN COMPARISON:  Radiograph of same day. FINDINGS: Cardiovascular: Preferential opacification of the thoracic aorta. No evidence of thoracic aortic aneurysm or dissection. Normal heart size. No pericardial effusion. Mediastinum/Nodes: No enlarged mediastinal, hilar, or axillary lymph nodes. Thyroid gland, trachea, and esophagus demonstrate no significant findings. Lungs/Pleura: No pneumothorax is noted. Left upper and lower lobe opacities are noted concerning for atelectasis or pneumonia. Minimal right posterior basilar subsegmental atelectasis is noted. 3.1 x 1.9 cm pleural based mass is noted medially in the right upper lobe highly  concerning for malignancy. This is best seen on image number 46 of series 5. 14 x 12 mm nodule is noted in right upper lobe concerning for malignancy. Mild emphysematous disease is noted in both upper lobes. Upper Abdomen: No acute abnormality. Musculoskeletal: No chest wall abnormality. No acute or significant osseous findings. Review of the MIP images confirms the above findings. IMPRESSION: No evidence of thoracic aortic dissection or aneurysm. 3.1 x 1.9 cm pleural based mass is noted medially in the right upper lobe highly concerning for malignancy. Also noted is 1.4 x 1.2 cm nodule in the right upper lobe concerning for malignancy or metastatic disease. PET scan is recommended for further evaluation. Left upper and lower lobe airspace opacities are noted concerning for pneumonia or atelectasis. Emphysema (ICD10-J43.9). Electronically Signed   By: Marijo Conception M.D.   On: 04/03/2019 11:36    Procedures .Critical Care Performed by: Renita Papa, PA-C Authorized by: Renita Papa, PA-C   Critical care provider statement:    Critical care time (minutes):  40   Critical care was necessary to treat or prevent imminent or life-threatening deterioration of the following conditions:  Respiratory failure   Critical care was time spent personally by me on the following activities:  Discussions with consultants, evaluation of patient's response to treatment, examination of patient, ordering and performing treatments and interventions, ordering and review of laboratory studies, ordering and review of radiographic studies, pulse oximetry, re-evaluation of patient's condition, obtaining history from patient or surrogate and review of old charts   (including critical care time)  Medications Ordered in ED Medications  cefTRIAXone (ROCEPHIN) 1 g in sodium chloride 0.9 % 100 mL IVPB (has no administration in time range)  azithromycin (ZITHROMAX) 500 mg in sodium chloride 0.9 % 250 mL IVPB (has no  administration in time range)  HYDROmorphone (DILAUDID) injection 0.5 mg (0.5 mg Intravenous Given 04/03/19 0737)  HYDROmorphone (DILAUDID) injection 1 mg (1 mg Intravenous Given 04/03/19 0838)  iohexol (OMNIPAQUE) 350 MG/ML injection 80 mL (80 mLs Intravenous Contrast Given 04/03/19 1122)    ED Course  I have reviewed  the triage vital signs and the nursing notes.  Pertinent labs & imaging results that were available during my care of the patient were reviewed by me and considered in my medical decision making (see chart for details).    MDM Rules/Calculators/A&P                      Brenda [name] was evaluated in Emergency Department on 04/03/2019 for the symptoms described in the history of present illness. She was evaluated in the context of the global COVID-19 pandemic, which necessitated consideration that the patient might be at risk for infection with the SARS-CoV-2 virus that causes COVID-19. Institutional protocols and algorithms that pertain to the evaluation of patients at risk for COVID-19 are in a state of rapid change based on information released by regulatory bodies including the CDC and federal and state organizations. These policies and algorithms were followed during the patient's care in the ED.  Patient presenting for evaluation after fall yesterday.  Found to be hypoxic at 87% on room air, improved on nonrebreather with EMS able to be weaned down to 4 L supplemental oxygen via nasal cannula in the ED.  Radiographs are no definite evidence of rib fracture, possible lung mass noted.  Given she is acutely hypoxic with chest pain we will obtain a PE study for further evaluation.  Lab work reviewed by me shows no leukocytosis, no anemia, no metabolic derangements, no renal insufficiency.  Her rapid Covid test is negative.  Her EKG shows normal sinus rhythm.  Doubt ACS/MI.  Doubt dissection, cardiac tamponade, esophageal rupture, or CHF.  CT of the chest shows no evidence of PE or  dissection.  She does have a pleural mass concerning for neoplasm as well as another mass in the right upper lobe concerning for malignancy or metastatic disease.  She has left upper and lower lobe airspace opacities concerning for pneumonia.  On reevaluation she is resting more comfortably, O2 saturations around 92 to 94% on 4 L.  She remains quite tachypneic.  Will start on Rocephin and azithromycin for presumed community-acquired pneumonia.  Rapid Covid test is pending.  Family medicine teaching service to admit.  Final Clinical Impression(s) / ED Diagnoses Final diagnoses:  Acute respiratory failure with hypoxia [facility])  Multifocal pneumonia  Lung mass    Rx / DC Orders ED Discharge Orders    None       Debroah Baller 04/03/19 1445    Palumbo, April, MD 04/04/19 0028

## 2019-04-03 NOTE — Plan of Care (Signed)
  Problem: Education: Goal: Knowledge of General Education information will improve Description Including pain rating scale, medication(s)/side effects and non-pharmacologic comfort measures Outcome: Progressing   

## 2019-04-03 NOTE — Consult Note (Signed)
NAME:  Brenda [name], MRN:  [id number], DOB:  Jul 12, 1960, LOS: 0 ADMISSION DATE:  04/03/2019, CONSULTATION DATE:  04/03/19 REFERRING MD:  Ardelia Mems, CHIEF COMPLAINT:  Back pain   Brief History   59 year old smoker presenting after fall with musculoskeletal pain found to have a very abnormal CT scan of chest.  History of present illness   89 year ol2d smoker presenting after fall with right back musculoskeletal pain found to have left sided airspace disease and two discrete R sided lung masses/nodules.  Patient denies any back pain prior to this morning when she rolled off the couch in her sleep and hit her right back.  Since this fall she has had a lot of pleurisy.  This led to a rib x-ray which showed a right nodule versus mass which led to the CT scan above.  Patient smokes about a third of a pack a day.  She has been smoking for the past 30 years.  She has never had a colonoscopy nor a mammogram.  She has no other medical problems that she knows of.  She states she only drinks on weekends and has never had alcohol withdrawal.  She denies any recent weight loss, fever, chills, sick contact.  She has a daily cough productive of clear to yellow sputum which is unchanged.  Past Medical History  Depression  Significant Hospital Events   04/03/2019 admitted  Consults:  PCCM  Procedures:  None  Significant Diagnostic Tests:  CTA chest IMPRESSION: No evidence of thoracic aortic dissection or aneurysm. 3.1 x 1.9 cm pleural based mass is noted medially in the right upper lobe highly concerning for malignancy. Also noted is 1.4 x 1.2 cm nodule in the right upper lobe concerning for malignancy or metastatic disease. PET scan is recommended for further evaluation Left upper and lower lobe airspace opacities are noted concerning for pneumonia or atelectasis. Emphysema (ICD10-J43.9).  Micro Data:  COVID neg Pct >>   Antimicrobials:  Ceftriaxone >> Azithromycin>>   Interim  history/subjective:  Consulted. ROS as below.  Objective   Blood pressure 137/77, pulse 88, temperature 98 F (36.7 C), temperature source Oral, resp. rate (!) 27, height 5\' 6"  (1.676 m), weight 57.2 kg, last menstrual period 01/19/2011, SpO2 100 %.       No intake or output data in the 24 hours ending 04/03/19 1616 Filed Weights   04/03/19 0630  Weight: 57.2 kg    Examination: General: Middle-aged woman wincing in pain grabbing her right side HENT: Mucous membranes moist, no palpable adenopathy Lungs: Lung sounds are diminished on left, no wheezing Cardiovascular: Heart sounds are regular, extremities warm Abdomen: Abdomen soft, positive bowel sounds Extremities: There is no edema Neuro: Moves all 4 extremities to command Skin: no rashes  Resolved Hospital Problem list   N/A  Assessment & Plan:  # Abnormal CT scan in smoker # Right-sided pleurisy  She has changes in the left that would make me concern for endobronchial involvement of cancer if it is not infectious.  She does not have any clear symptoms of pneumonia.  Her nodules in the left side are also concerning for cancer.  Given smoking history, bronchogenic carcinoma is leading differential.  I discussed the case at length with Dr. Valeta Harms.  We will do bronchoscopy with airway and lymph node inspection.  If unrevealing attempt navigational biopsy of RUL mass.  Peri-fissural mass on R can be attempted but will be tough.  Case is at 145PM in OR 10 case #  [id number]  Discussed that this was likely cancer with patient and the bronchoscopy procedure itself.  Will retime lovenox for Wednesday.  For chest wall pain and spasms, valium PRN, standing toradol, and fentanyl for severe pain, oxycodone for moderate.  Labs   CBC: Recent Labs  Lab 04/03/19 0651  WBC 7.0  NEUTROABS 5.7  HGB 14.0  HCT 40.5  MCV 95.7  PLT 030    Basic Metabolic Panel: Recent Labs  Lab 04/03/19 0651  NA 135  K 3.4*  CL 99  CO2 26  GLUCOSE  116*  BUN <5*  CREATININE 0.54  CALCIUM 8.9   GFR: Estimated Creatinine Clearance: 70.9 mL/min (by C-G formula based on SCr of 0.54 mg/dL). Recent Labs  Lab 04/03/19 0651  WBC 7.0    Liver Function Tests: No results for input(s): AST, ALT, ALKPHOS, BILITOT, PROT, ALBUMIN in the last 168 hours. No results for input(s): LIPASE, AMYLASE in the last 168 hours. No results for input(s): AMMONIA in the last 168 hours.  ABG No results found for: PHART, PCO2ART, PO2ART, HCO3, TCO2, ACIDBASEDEF, O2SAT   Coagulation Profile: No results for input(s): INR, PROTIME in the last 168 hours.  Cardiac Enzymes: No results for input(s): CKTOTAL, CKMB, CKMBINDEX, TROPONINI in the last 168 hours.  HbA1C: No results found for: HGBA1C  CBG: No results for input(s): GLUCAP in the last 168 hours.  Review of Systems:    Positive Symptoms in bold:  Constitutional fevers, chills, weight loss, fatigue, anorexia, malaise  Eyes decreased vision, double vision, eye irritation  Ears, Nose, Mouth, Throat sore throat, trouble swallowing, sinus congestion  Cardiovascular chest pain, paroxysmal nocturnal dyspnea, lower ext edema, palpitations   Respiratory SOB, cough, DOE, hemoptysis, wheezing  Gastrointestinal nausea, vomiting, diarrhea  Genitourinary burning with urination, trouble urinating  Musculoskeletal joint aches, joint swelling, back pain  Integumentary  rashes, skin lesions  Neurological focal weakness, focal numbness, trouble speaking, headaches  Psychiatric depression, anxiety, confusion  Endocrine polyuria, polydipsia, cold intolerance, heat intolerance  Hematologic abnormal bruising, abnormal bleeding, unexplained nose bleeds  Allergic/Immunologic recurrent infections, hives, swollen lymph nodes     Past Medical History  She,  has a past medical history of Depression and Mental disorder.   Surgical History    Past Surgical History:  Procedure Laterality Date  . TUBAL LIGATION         Social History   reports that she has been smoking cigarettes. She has a 15.00 pack-year smoking history. She has never used smokeless tobacco. She reports current alcohol use. She reports that she does not use drugs.   Family History   Her family history is not on file.   Allergies Allergies  Allergen Reactions  . Aspirin Adult Low [Aspirin]     Stomach upset     Home Medications  Prior to Admission medications   Medication Sig Start Date End Date Taking? Authorizing Provider  Aspirin-Acetaminophen-Caffeine (GOODY HEADACHE PO) Take 1 packet by mouth daily as needed (pain and headache).   Yes [provider]  carboxymethylcellul-glycerin (LUBRICATING EYE DROPS) 0.5-0.9 % ophthalmic solution Place 1 drop into both eyes as needed for dry eyes.   Yes [provider]  famotidine (PEPCID) 20 MG tablet Take 1 tablet (20 mg total) by mouth 2 (two) times daily as needed for heartburn or indigestion. Patient not taking: Reported on 04/03/2019 06/02/17   Encarnacion Slates, NP

## 2019-04-03 NOTE — ED Triage Notes (Signed)
Patient arrived via EMS for shortness of breath since 0530 today. Patient reports falling last night and complains of 10/10 rib pain on the upper right side. No crepitus noted on palpation as tolerated by pt. Called EMS because she said she could not walk. VS from EMS: 170/98 received 0.4 mg of nitro from EMS. Initial SPO2 was 87% patient was placed on 10 liters NRB and came up to 98.

## 2019-04-03 NOTE — ED Notes (Signed)
Patient transported to CT 

## 2019-04-03 NOTE — Progress Notes (Signed)
Patient received from ED to 6N22. Alert and oriented x4. VSS on  Oxygen at 2LPM. Skin warm and intact. C/o right rib cage  pain with activity 2/10 when steady. Oriented to room and call bell.

## 2019-04-03 NOTE — ED Notes (Signed)
Patient placed on 4 liters nasal cannula because she was 100% on the NRB

## 2019-04-04 ENCOUNTER — Inpatient Hospital Stay (HOSPITAL_COMMUNITY): Payer: Self-pay

## 2019-04-04 ENCOUNTER — Inpatient Hospital Stay (HOSPITAL_COMMUNITY): Payer: Self-pay | Admitting: Certified Registered"

## 2019-04-04 ENCOUNTER — Encounter (HOSPITAL_COMMUNITY): Payer: Self-pay | Admitting: Family Medicine

## 2019-04-04 ENCOUNTER — Encounter (HOSPITAL_COMMUNITY)
Admission: EM | Disposition: A | Discharge status: Home Health | Payer: Self-pay | Source: Home / Self Care | Attending: Family Medicine

## 2019-04-04 DIAGNOSIS — J9601 Acute respiratory failure with hypoxia: Secondary | ICD-10-CM

## 2019-04-04 DIAGNOSIS — J189 Pneumonia, unspecified organism: Principal | ICD-10-CM

## 2019-04-04 DIAGNOSIS — F1010 Alcohol abuse, uncomplicated: Secondary | ICD-10-CM

## 2019-04-04 HISTORY — PX: VIDEO BRONCHOSCOPY WITH ENDOBRONCHIAL ULTRASOUND: SHX6177

## 2019-04-04 HISTORY — PX: VIDEO BRONCHOSCOPY WITH ENDOBRONCHIAL NAVIGATION: SHX6175

## 2019-04-04 LAB — COMPREHENSIVE METABOLIC PANEL
ALT: 11 U/L (ref 0–44)
AST: 13 U/L — ABNORMAL LOW (ref 15–41)
Albumin: 2.7 g/dL — ABNORMAL LOW (ref 3.5–5.0)
Alkaline Phosphatase: 83 U/L (ref 38–126)
Anion gap: 9 (ref 5–15)
BUN: 10 mg/dL (ref 6–20)
CO2: 26 mmol/L (ref 22–32)
Calcium: 8.8 mg/dL — ABNORMAL LOW (ref 8.9–10.3)
Chloride: 99 mmol/L (ref 98–111)
Creatinine, Ser: 0.63 mg/dL (ref 0.44–1.00)
GFR calc Af Amer: >60 mL/min (ref >60)
GFR calc non Af Amer: >60 mL/min (ref >60)
Glucose, Bld: 111 mg/dL — ABNORMAL HIGH (ref 70–99)
Potassium: 3.4 mmol/L — ABNORMAL LOW (ref 3.5–5.1)
Sodium: 134 mmol/L — ABNORMAL LOW (ref 135–145)
Total Bilirubin: 0.8 mg/dL (ref 0.3–1.2)
Total Protein: 6.1 g/dL — ABNORMAL LOW (ref 6.5–8.1)

## 2019-04-04 LAB — CD4/CD8 (T-HELPER/T-SUPPRESSOR CELL)
CD4 absolute: 510 /uL (ref 400–1790)
CD4%: 56 % (ref 33–65)
CD8 T Cell Abs: 170 /uL — ABNORMAL LOW (ref 190–1000)
CD8tox: 19 % (ref 12–40)
Ratio: 3 (ref 1.0–3.0)
Total lymphocyte count: 912 /uL — ABNORMAL LOW (ref 1000–4000)

## 2019-04-04 LAB — CBC
HCT: 35.3 % — ABNORMAL LOW (ref 36.0–46.0)
Hemoglobin: 11.8 g/dL — ABNORMAL LOW (ref 12.0–15.0)
MCH: 32.5 pg (ref 26.0–34.0)
MCHC: 33.4 g/dL (ref 30.0–36.0)
MCV: 97.2 fL (ref 80.0–100.0)
Platelets: 201 10*3/uL (ref 150–400)
RBC: 3.63 MIL/uL — ABNORMAL LOW (ref 3.87–5.11)
RDW: 13.6 % (ref 11.5–15.5)
WBC: 11.5 10*3/uL — ABNORMAL HIGH (ref 4.0–10.5)
nRBC: 0 % (ref 0.0–0.2)

## 2019-04-04 LAB — APTT: aPTT: 32 seconds (ref 24–36)

## 2019-04-04 LAB — PROTIME-INR
INR: 1.1 (ref 0.8–1.2)
Prothrombin Time: 13.9 seconds (ref 11.4–15.2)

## 2019-04-04 IMAGING — CR DG CHEST 1V PORT
1 series · 1 of 1 positions shown · non-contrast
Comparison: CT angiography [DATE], radiographs [DATE] cough
I am going to all aunt

CLINICAL DATA: Postoperative video bronchoscopy with biopsies, rule
out pneumothorax S patient is complaining of right-sided rib pain

EXAM:
PORTABLE CHEST 1 VIEW

[AP]
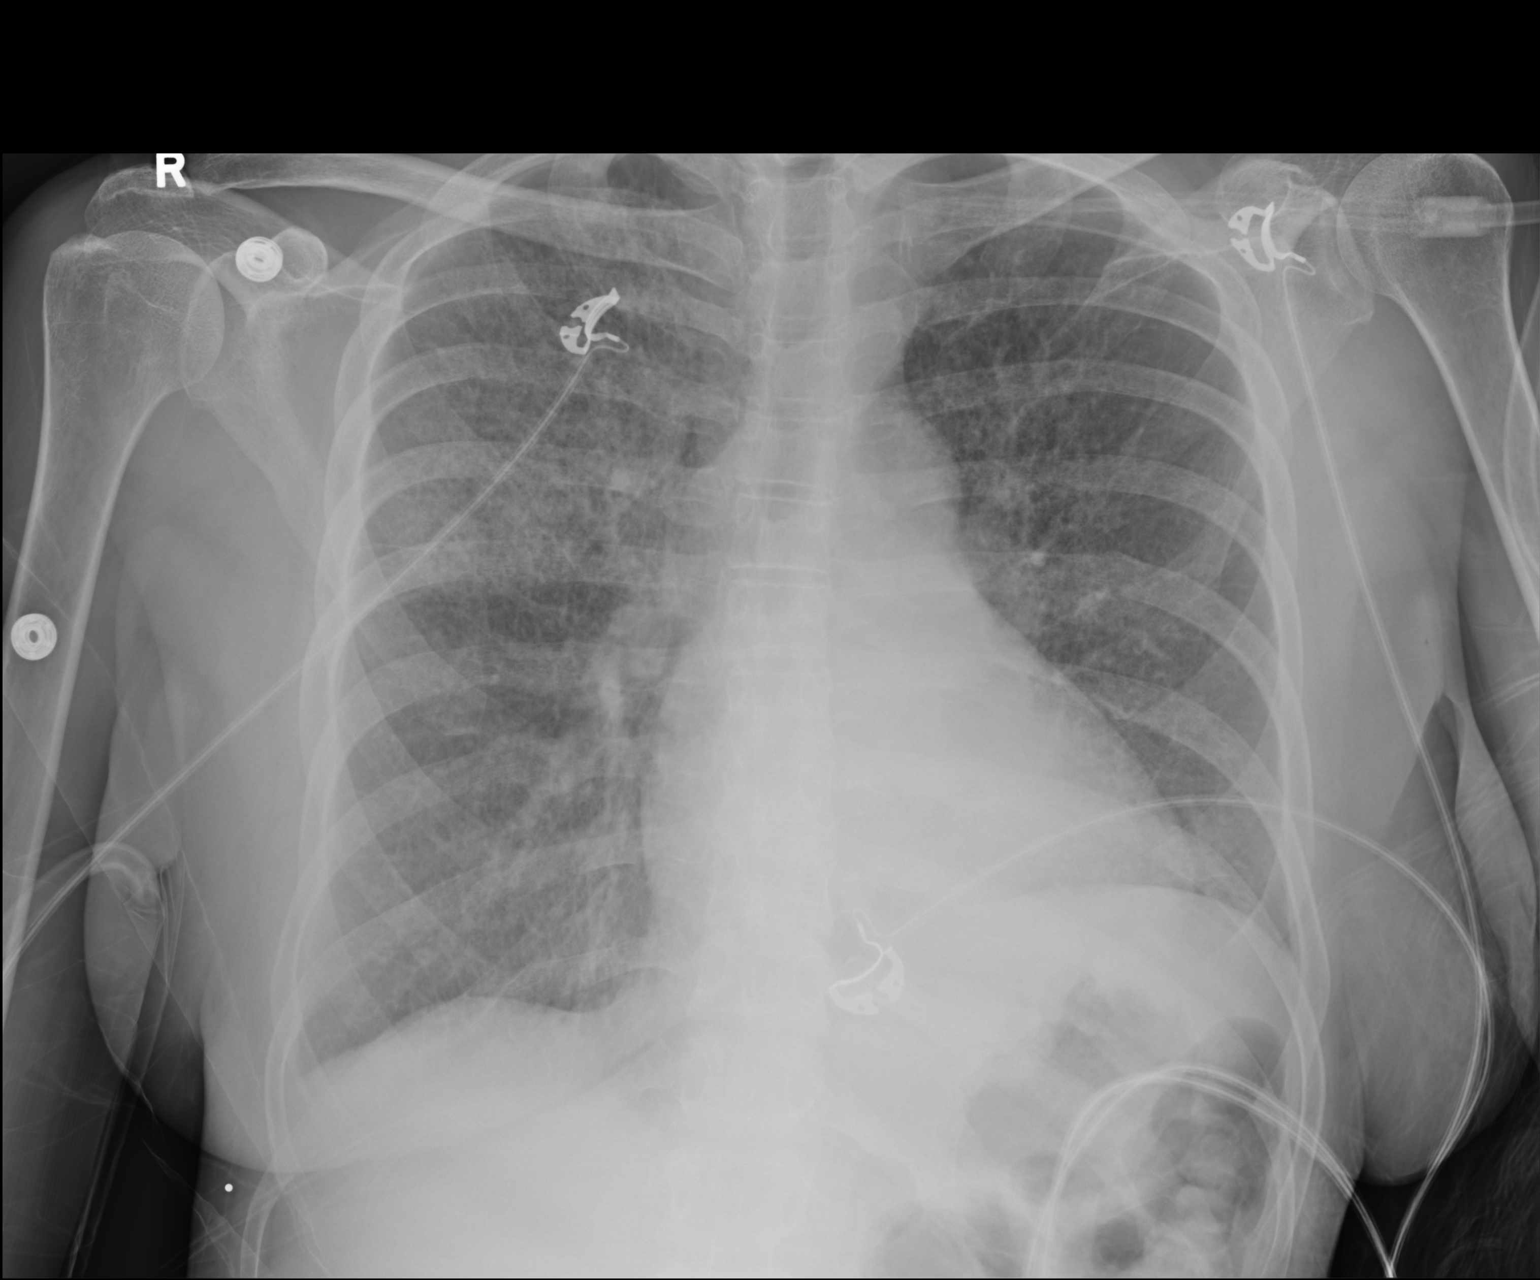

[1 of 1 positions shown; findings below may reference images not displayed]

FINDINGS: Markedly increased interstitial opacity throughout both lungs most
focally in the right upper lobe. Multiple masslike opacities
throughout the lungs are better seen on comparison CT from 1 day
prior. No visible pneumothorax. Trace bilateral effusions.
Cardiomediastinal contours are unchanged. No acute osseous or soft
tissue abnormality. Degenerative changes are present in the imaged
spine and shoulders.
IMPRESSION: 1. No pneumothorax.
2. Markedly increased interstitial opacity throughout both lungs
following bronchoscopy, most focally in the right upper lobe.
3. Multiple masslike opacities throughout the lungs are better seen
on comparison CT.

## 2019-04-04 SURGERY — VIDEO BRONCHOSCOPY WITH ENDOBRONCHIAL NAVIGATION
Anesthesia: General

## 2019-04-04 MED ORDER — FENTANYL CITRATE (PF) 250 MCG/5ML IJ SOLN
INTRAMUSCULAR | Status: DC | PRN
  Administered 2019-04-04: 100 ug via INTRAVENOUS

## 2019-04-04 MED ORDER — POTASSIUM CHLORIDE 10 MEQ/100ML IV SOLN
10.0000 meq | INTRAVENOUS | Status: DC
  Administered 2019-04-04 (×2): 10 meq via INTRAVENOUS
  Filled 2019-04-04 (×2): qty 100

## 2019-04-04 MED ORDER — POTASSIUM CHLORIDE 10 MEQ/100ML IV SOLN: 10.0000 meq | INTRAVENOUS | Status: DC

## 2019-04-04 MED ORDER — ONDANSETRON HCL 4 MG/2ML IJ SOLN
INTRAMUSCULAR | Status: DC | PRN
  Administered 2019-04-04: 4 mg via INTRAVENOUS

## 2019-04-04 MED ORDER — SODIUM CHLORIDE 0.9 % IV SOLN
500.0000 mg | INTRAVENOUS | Status: DC
  Administered 2019-04-04: 500 mg via INTRAVENOUS
  Filled 2019-04-04 (×3): qty 500

## 2019-04-04 MED ORDER — OXYCODONE HCL 5 MG PO TABS
ORAL_TABLET | ORAL | Status: AC
  Filled 2019-04-04: qty 1

## 2019-04-04 MED ORDER — MIDAZOLAM HCL 2 MG/2ML IJ SOLN
INTRAMUSCULAR | Status: AC
  Filled 2019-04-04: qty 2

## 2019-04-04 MED ORDER — PHENYLEPHRINE HCL-NACL 10-0.9 MG/250ML-% IV SOLN
INTRAVENOUS | Status: DC | PRN
  Administered 2019-04-04: 45 ug/min via INTRAVENOUS

## 2019-04-04 MED ORDER — ROCURONIUM BROMIDE 10 MG/ML (PF) SYRINGE
PREFILLED_SYRINGE | INTRAVENOUS | Status: AC
  Filled 2019-04-04: qty 10

## 2019-04-04 MED ORDER — AZITHROMYCIN 250 MG PO TABS: 250.0000 mg | ORAL_TABLET | Freq: Every day | ORAL | 0 refills | Status: AC

## 2019-04-04 MED ORDER — OXYCODONE HCL 5 MG PO TABS
5.0000 mg | ORAL_TABLET | Freq: Once | ORAL | Status: AC | PRN
  Administered 2019-04-04: 16:00:00 5 mg via ORAL

## 2019-04-04 MED ORDER — INFLUENZA VAC SPLIT QUAD 0.5 ML IM SUSY: 0.5000 mL | PREFILLED_SYRINGE | INTRAMUSCULAR | Status: DC

## 2019-04-04 MED ORDER — SUGAMMADEX SODIUM 200 MG/2ML IV SOLN
INTRAVENOUS | Status: DC | PRN
  Administered 2019-04-04: 200 mg via INTRAVENOUS

## 2019-04-04 MED ORDER — ESMOLOL HCL 100 MG/10ML IV SOLN
INTRAVENOUS | Status: DC | PRN
  Administered 2019-04-04: 30 ug via INTRAVENOUS

## 2019-04-04 MED ORDER — FENTANYL CITRATE (PF) 100 MCG/2ML IJ SOLN
25.0000 ug | INTRAMUSCULAR | Status: DC | PRN
  Administered 2019-04-04: 16:00:00 25 ug via INTRAVENOUS

## 2019-04-04 MED ORDER — FENTANYL CITRATE (PF) 250 MCG/5ML IJ SOLN
INTRAMUSCULAR | Status: AC
  Filled 2019-04-04: qty 5

## 2019-04-04 MED ORDER — POTASSIUM CHLORIDE 10 MEQ/100ML IV SOLN
10.0000 meq | INTRAVENOUS | Status: AC
  Administered 2019-04-04 (×2): 10 meq via INTRAVENOUS
  Filled 2019-04-04: qty 100

## 2019-04-04 MED ORDER — PNEUMOCOCCAL VAC POLYVALENT 25 MCG/0.5ML IJ INJ: 0.5000 mL | INJECTION | INTRAMUSCULAR | Status: DC

## 2019-04-04 MED ORDER — DEXAMETHASONE SODIUM PHOSPHATE 10 MG/ML IJ SOLN
INTRAMUSCULAR | Status: AC
  Filled 2019-04-04: qty 1

## 2019-04-04 MED ORDER — ONDANSETRON HCL 4 MG/2ML IJ SOLN
INTRAMUSCULAR | Status: AC
  Filled 2019-04-04: qty 2

## 2019-04-04 MED ORDER — FENTANYL CITRATE (PF) 100 MCG/2ML IJ SOLN
INTRAMUSCULAR | Status: AC
  Filled 2019-04-04: qty 2

## 2019-04-04 MED ORDER — CEFDINIR 300 MG PO CAPS: 300.0000 mg | ORAL_CAPSULE | Freq: Two times a day (BID) | ORAL | 0 refills | Status: DC

## 2019-04-04 MED ORDER — DEXAMETHASONE SODIUM PHOSPHATE 10 MG/ML IJ SOLN
INTRAMUSCULAR | Status: DC | PRN
  Administered 2019-04-04: 10 mg via INTRAVENOUS

## 2019-04-04 MED ORDER — OXYCODONE HCL 5 MG/5ML PO SOLN: 5.0000 mg | Freq: Once | ORAL | Status: AC | PRN

## 2019-04-04 MED ORDER — LIDOCAINE 2% (20 MG/ML) 5 ML SYRINGE
INTRAMUSCULAR | Status: DC | PRN
  Administered 2019-04-04: 60 mg via INTRAVENOUS

## 2019-04-04 MED ORDER — PROPOFOL 500 MG/50ML IV EMUL
INTRAVENOUS | Status: DC | PRN
  Administered 2019-04-04: 50 ug/kg/min via INTRAVENOUS

## 2019-04-04 MED ORDER — ROCURONIUM BROMIDE 50 MG/5ML IV SOSY
PREFILLED_SYRINGE | INTRAVENOUS | Status: DC | PRN
  Administered 2019-04-04 (×2): 30 mg via INTRAVENOUS
  Administered 2019-04-04: 40 mg via INTRAVENOUS

## 2019-04-04 MED ORDER — ONDANSETRON HCL 4 MG/2ML IJ SOLN: 4.0000 mg | Freq: Four times a day (QID) | INTRAMUSCULAR | Status: DC | PRN

## 2019-04-04 MED ORDER — LACTATED RINGERS IV SOLN: INTRAVENOUS | Status: DC

## 2019-04-04 MED ORDER — LIDOCAINE 2% (20 MG/ML) 5 ML SYRINGE
INTRAMUSCULAR | Status: AC
  Filled 2019-04-04: qty 5

## 2019-04-04 MED ORDER — PROPOFOL 10 MG/ML IV BOLUS
INTRAVENOUS | Status: AC
  Filled 2019-04-04: qty 20

## 2019-04-04 MED ORDER — MIDAZOLAM HCL 5 MG/5ML IJ SOLN
INTRAMUSCULAR | Status: DC | PRN
  Administered 2019-04-04: 2 mg via INTRAVENOUS

## 2019-04-04 MED ORDER — SODIUM CHLORIDE 0.9 % IV SOLN
2.0000 g | INTRAVENOUS | Status: DC
  Administered 2019-04-04: 2 g via INTRAVENOUS
  Filled 2019-04-04: qty 20
  Filled 2019-04-04: qty 2
  Filled 2019-04-04: qty 20

## 2019-04-04 MED ORDER — POTASSIUM CHLORIDE 10 MEQ/100ML IV SOLN
10.0000 meq | INTRAVENOUS | Status: AC
  Filled 2019-04-04: qty 100

## 2019-04-04 MED ORDER — 0.9 % SODIUM CHLORIDE (POUR BTL) OPTIME
TOPICAL | Status: DC | PRN
  Administered 2019-04-04: 14:00:00 1000 mL

## 2019-04-04 MED ORDER — PROPOFOL 10 MG/ML IV BOLUS
INTRAVENOUS | Status: DC | PRN
  Administered 2019-04-04: 130 mg via INTRAVENOUS

## 2019-04-04 MED FILL — AZITHROMYCIN 250 MG TABLET: 250 | 4 days supply | Qty: 4 | Fill #0

## 2019-04-04 MED FILL — CEFDINIR 300 MG CAPSULE: 300 | 4 days supply | Qty: 8 | Fill #0

## 2019-04-04 SURGICAL SUPPLY — 53 items
ADAPTER BRONCHOSCOPE OLYMP 190 (ADAPTER) ×3 IMPLANT
ADAPTER BRONCHOSCOPE OLYMPUS (ADAPTER) ×3 IMPLANT
ADAPTER VALVE BIOPSY EBUS (MISCELLANEOUS) IMPLANT
ADPTR VALVE BIOPSY EBUS (MISCELLANEOUS)
BRUSH CYTOL CELLEBRITY 1.5X140 (MISCELLANEOUS) ×6 IMPLANT
BRUSH CYTOLOGY 1.4 (MISCELLANEOUS) ×3 IMPLANT
BRUSH SUPERTRAX BIOPSY (INSTRUMENTS) IMPLANT
BRUSH SUPERTRAX NDL-TIP CYTO (INSTRUMENTS) ×6 IMPLANT
CANISTER SUCT 3000ML PPV (MISCELLANEOUS) ×3 IMPLANT
CONT SPEC 4OZ CLIKSEAL STRL BL (MISCELLANEOUS) ×6 IMPLANT
COVER BACK TABLE 60X90IN (DRAPES) ×3 IMPLANT
COVER WAND RF STERILE (DRAPES) ×3 IMPLANT
FILTER STRAW FLUID ASPIR (MISCELLANEOUS) IMPLANT
FORCEPS BIOP RJ4 1.8 (CUTTING FORCEPS) IMPLANT
FORCEPS BIOP SUPERTRX PREMAR (INSTRUMENTS) ×6 IMPLANT
GAUZE SPONGE 4X4 12PLY STRL (GAUZE/BANDAGES/DRESSINGS) ×3 IMPLANT
GLOVE BIO SURGEON STRL SZ 6.5 (GLOVE) ×2 IMPLANT
GLOVE BIO SURGEONS STRL SZ 6.5 (GLOVE) ×1
GLOVE BIOGEL PI IND STRL 6.5 (GLOVE) ×1 IMPLANT
GLOVE BIOGEL PI INDICATOR 6.5 (GLOVE) ×2
GLOVE SURG SS PI 7.5 STRL IVOR (GLOVE) ×9 IMPLANT
GOWN STRL REUS W/ TWL LRG LVL3 (GOWN DISPOSABLE) ×2 IMPLANT
GOWN STRL REUS W/TWL LRG LVL3 (GOWN DISPOSABLE) ×4
KIT CLEAN ENDO COMPLIANCE (KITS) ×6 IMPLANT
KIT ILLUMISITE 180 PROCEDURE (KITS) IMPLANT
KIT ILLUMISITE 90 PROCEDURE (KITS) ×3 IMPLANT
KIT LOCATABLE GUIDE (CANNULA) IMPLANT
KIT MARKER FIDUCIAL DELIVERY (KITS) IMPLANT
KIT TURNOVER KIT B (KITS) ×3 IMPLANT
MARKER SKIN DUAL TIP RULER LAB (MISCELLANEOUS) ×3 IMPLANT
NEEDLE ASPIRATION VIZISHOT 19G (NEEDLE) IMPLANT
NEEDLE ASPIRATION VIZISHOT 21G (NEEDLE) IMPLANT
NEEDLE SUPERTRX PREMARK BIOPSY (NEEDLE) ×6 IMPLANT
NS IRRIG 1000ML POUR BTL (IV SOLUTION) ×3 IMPLANT
OIL SILICONE PENTAX (PARTS (SERVICE/REPAIRS)) ×3 IMPLANT
PAD ARMBOARD 7.5X6 YLW CONV (MISCELLANEOUS) ×6 IMPLANT
PATCHES PATIENT (LABEL) ×9 IMPLANT
STOPCOCK 4 WAY LG BORE MALE ST (IV SETS) ×3 IMPLANT
SYR 20ML ECCENTRIC (SYRINGE) ×9 IMPLANT
SYR 20ML LL LF (SYRINGE) ×3 IMPLANT
SYR 3ML LL SCALE MARK (SYRINGE) IMPLANT
SYR 50ML SLIP (SYRINGE) ×3 IMPLANT
SYR 5ML LL (SYRINGE) ×12 IMPLANT
TOWEL GREEN STERILE FF (TOWEL DISPOSABLE) ×6 IMPLANT
TRAP SPECIMEN MUCOUS 40CC (MISCELLANEOUS) ×3 IMPLANT
TUBE CONNECTING 20'X1/4 (TUBING) ×1
TUBE CONNECTING 20X1/4 (TUBING) ×2 IMPLANT
TUBING EXTENTION W/L.L. (IV SETS) ×3 IMPLANT
UNDERPAD 30X30 (UNDERPADS AND DIAPERS) ×3 IMPLANT
VALVE BIOPSY  SINGLE USE (MISCELLANEOUS) ×2
VALVE BIOPSY SINGLE USE (MISCELLANEOUS) ×1 IMPLANT
VALVE SUCTION BRONCHIO DISP (MISCELLANEOUS) ×3 IMPLANT
WATER STERILE IRR 1000ML POUR (IV SOLUTION) ×3 IMPLANT

## 2019-04-04 NOTE — Consult Note (Signed)
NAME:  Brenda [name], MRN:  [id number], DOB:  1960/10/26, LOS: 1 ADMISSION DATE:  04/03/2019, CONSULTATION DATE:  04/03/19 REFERRING MD:  Ardelia Mems, CHIEF COMPLAINT:  Back pain   Brief History   59 year old smoker presenting after fall with musculoskeletal pain found to have a very abnormal CT scan of chest.  History of present illness   59 year ol2d smoker presenting after fall with right back musculoskeletal pain found to have left sided airspace disease and two discrete R sided lung masses/nodules.  Patient denies any back pain prior to this morning when she rolled off the couch in her sleep and hit her right back.  Since this fall she has had a lot of pleurisy.  This led to a rib x-ray which showed a right nodule versus mass which led to the CT scan above.  Patient smokes about a third of a pack a day.  She has been smoking for the past 30 years.  She has never had a colonoscopy nor a mammogram.  She has no other medical problems that she knows of.  She states she only drinks on weekends and has never had alcohol withdrawal.  She denies any recent weight loss, fever, chills, sick contact.  She has a daily cough productive of clear to yellow sputum which is unchanged.  Past Medical History  Depression  Significant Hospital Events   04/03/2019 admitted  Consults:  PCCM  Procedures:  None  Significant Diagnostic Tests:   CTA chest IMPRESSION: No evidence of thoracic aortic dissection or aneurysm. 3.1 x 1.9 cm pleural based mass is noted medially in the right upper lobe highly concerning for malignancy. Also noted is 1.4 x 1.2 cm nodule in the right upper lobe concerning for malignancy or metastatic disease. PET scan is recommended for further evaluation Left upper and lower lobe airspace opacities are noted concerning for pneumonia or atelectasis. Emphysema (ICD10-J43.9).  Micro Data:  COVID neg Pct >>   Antimicrobials:  Ceftriaxone >> Azithromycin>>   Interim  history/subjective:   Doing ok. Still having pain in the right posterior back   Objective   Blood pressure 121/70, pulse 76, temperature 98.2 F (36.8 C), temperature source Oral, resp. rate 18, height _0  (1.676 m), weight 54.4 kg, last menstrual period 01/19/2011, SpO2 92 %.       No intake or output data in the 24 hours ending 04/04/19 1015 Filed Weights   04/03/19 0630 04/03/19 2057  Weight: 57.2 kg 54.4 kg    Examination: General appearance: 59 y.o., female, NAD, conversant  Eyes: anicteric sclerae, moist conjunctivae; no lid-lag; PERRLA, tracking appropriately HENT: NCAT; oropharynx, MMM Neck: Trachea midline; FROM, supple, lymphadenopathy, no JVD Lungs: CTAB, no crackles, no wheeze CV: RRR, S1, S2, no MRGs  Abdomen: Soft, non-tender; non-distended, BS present  Extremities: No peripheral edema, radial and DP pulses present bilaterally  Skin: Normal temperature, turgor and texture; no rash Psych: Appropriate affect Neuro: Alert and oriented to person and place, no focal deficit     Resolved Hospital Problem list   N/A  Assessment & Plan:   # Abnormal CT scan in smoker # Right-sided pleurisy # Right upper lobe pleural based mass  # RUL nodule, smaller on fissure line, possible metastatic focus  # LLL infiltrate, possible pneumonia, vs obstructive   There may be something in the LLL but hard to tell on CT. The RUL medial lesion within the spinous recess is likely the easiest target. The small RUL nodule is involving  the fissure line and clinically is likely a metastatic focus but also high risk for procedural related pneumothorax.   Plan: Reviewed risks, benefits, and alternatives to procedure with patient  Planned ENB +/- EBUS bronchoscopy today, BAL for cx LLL Remain NPO until procedure We will address oncology coordination pending biopsy results.  If pre-lim path concerning for malignancy would recommend inpatient MRI brain  Also, need out patient PET scan ASAP  to complete staging.   We appreciate the consultation.    Labs   CBC: Recent Labs  Lab 04/03/19 0651 04/04/19 0228  WBC 7.0 11.5*  NEUTROABS 5.7  --   HGB 14.0 11.8*  HCT 40.5 35.3*  MCV 95.7 97.2  PLT 239 468    Basic Metabolic Panel: Recent Labs  Lab 04/03/19 0651 04/03/19 1722 04/04/19 0228  NA 135  --  134*  K 3.4*  --  3.4*  CL 99  --  99  CO2 26  --  26  GLUCOSE 116*  --  111*  BUN <5*  --  10  CREATININE 0.54  --  0.63  CALCIUM 8.9  --  8.8*  MG  --  1.8  --   PHOS  --  2.6  --    GFR: Estimated Creatinine Clearance: 67.4 mL/min (by C-G formula based on SCr of 0.63 mg/dL). Recent Labs  Lab 04/03/19 0651 04/03/19 1722 04/04/19 0228  PROCALCITON  --  <0.10  --   WBC 7.0  --  11.5*    Liver Function Tests: Recent Labs  Lab 04/04/19 0228  AST 13*  ALT 11  ALKPHOS 83  BILITOT 0.8  PROT 6.1*  ALBUMIN 2.7*   No results for input(s): LIPASE, AMYLASE in the last 168 hours. No results for input(s): AMMONIA in the last 168 hours.  ABG No results found for: PHART, PCO2ART, PO2ART, HCO3, TCO2, ACIDBASEDEF, O2SAT   Coagulation Profile: Recent Labs  Lab 04/04/19 0228  INR 1.1    Cardiac Enzymes: No results for input(s): CKTOTAL, CKMB, CKMBINDEX, TROPONINI in the last 168 hours.  HbA1C: No results found for: HGBA1C  CBG: No results for input(s): GLUCAP in the last 168 hours.    [facility] 04/04/2019 10:15 AM

## 2019-04-04 NOTE — Progress Notes (Signed)
Family Medicine Teaching Service Daily Progress Note Intern Pager: [phone]  Patient name: Brenda [name] Medical record number: [id number] Date of birth: Aug 13, 1960 Age: 59 y.o. Gender: female  Primary Care Provider: Patient, No Pcp Per Consultants: Pulmonology  Code Status: Full   Pt Overview and Major Events to Date:  04/03/19: Admitted, pulmonology consulted for lung masses 04/04/19: Bronchoscopy   Assessment and Plan: Brenda [name] is a 59 y.o. female who presented with acute respiratory failure satting in mid 80% on RA. PMH is significant for MDD, EtOH abuse   Acute Hypoxic Respiratory Failure   Multiple Lung Masses   CAP  WBC 11.5, uptrending from 7.0 yesterday. Afebrile overnight. Bronchoscopy at 1:45p today with Dr. Valeta Harms to evaluate lung massess. CT Chest indicated 3.1 x 1.9 cm pleural based mass and 1.4 x 1.2 cm nodule was noted in the right upper lobe that is concerning for malignancy.  Patient with 10 to 15-year pack history she began smoking 1/3 to 1/2 ppd since she was 59yo.  With patient significant smoking history and ongoing night sweats, concern for malignancy is high.  Additionally, radiology recommends PET scan for metastatic evaluation.   - Pulmonology consulted, appreciate recommendations  : bronch today  - Continuous Pulse Oxymetry  - Incentive spirometry - PRN oxycodone for moderate pain, Fentanyl for severe pain,  - PRN Valium   - CTX and Azithromycin (1/11 - )  - Consider PET scan  - Q4H Vitals  - AM CBC, BMP  - PT/OT : HH PT / OT; rolling walker w/ 5" wheels, tub/shower bench - Follow-up on respiratory panel - Lovenox after procedure     HIV + screening  Fourth-generation HIV screen reactive.  HIV RNA  pending.  Consult with infectious disease.  - Follow up acute Hep panel, Hep B/C - Follow up HIV RNA and CD4 count  - Follow up UDS  History of EtOH abuse  Drinks a 6-pk on the weekend.  - Monitor CIWAs   Major Depression Disorder  Stable.  Patient follows with [facility]. Denies taking any medication for depression. Denies SI, HI, auditory and visual hallucinations.  - Monitor mood    Hx of Acid Reflux  Patient states she takes no medication for this.  Has taken Pepcid in the past.    FEN/GI: NPO pending procedure  PPx: Lovenox to start after procedure   Disposition: likely home with home health   Subjective:  Brenda [name]  Had no significant overnight events    Objective: Temp:  [98.2 F (36.8 C)-98.9 F (37.2 C)] 98.2 F (36.8 C) (01/12 0428) Pulse Rate:  [76-95] 76 (01/12 0820) Resp:  [17-34] 18 (01/12 0428) BP: (117-146)/(56-88) 121/70 (01/12 0428) SpO2:  [91 %-100 %] 92 % (01/12 0820) Weight:  [54.4 kg] 54.4 kg (01/11 2057) Physical Exam: General: sitting upright in chair, in no acute distress  Cardiovascular: regular rate and rhythm, no murmurs appreciated  Respiratory: diffuse rhonchi, no wheezing, no rales, wearing [place]  Abdomen: soft, non tender, non distended, bowel sounds active  Extremities: no lower extremity edema, right flank tenderness   Laboratory: Recent Labs  Lab 04/03/19 0651 04/04/19 0228  WBC 7.0 11.5*  HGB 14.0 11.8*  HCT 40.5 35.3*  PLT 239 201   Recent Labs  Lab 04/03/19 0651 04/04/19 0228  NA 135 134*  K 3.4* 3.4*  CL 99 99  CO2 26 26  BUN <5* 10  CREATININE 0.54 0.63  CALCIUM 8.9 8.8*  PROT  --  6.1*  BILITOT  --  0.8  ALKPHOS  --  83  ALT  --  11  AST  --  13*  GLUCOSE 116* 111*      Imaging/Diagnostic Tests: CT Angio Chest PE W and/or Wo Contrast  Result Date: 04/03/2019 CLINICAL DATA:  Chest injury after fall yesterday. EXAM: CT ANGIOGRAPHY CHEST WITH CONTRAST TECHNIQUE: Multidetector CT imaging of the chest was performed using the standard protocol during bolus administration of intravenous contrast. Multiplanar CT image reconstructions and MIPs were obtained to evaluate the vascular anatomy. CONTRAST:  23mL OMNIPAQUE IOHEXOL 350 MG/ML SOLN COMPARISON:   Radiograph of same day. FINDINGS: Cardiovascular: Preferential opacification of the thoracic aorta. No evidence of thoracic aortic aneurysm or dissection. Normal heart size. No pericardial effusion. Mediastinum/Nodes: No enlarged mediastinal, hilar, or axillary lymph nodes. Thyroid gland, trachea, and esophagus demonstrate no significant findings. Lungs/Pleura: No pneumothorax is noted. Left upper and lower lobe opacities are noted concerning for atelectasis or pneumonia. Minimal right posterior basilar subsegmental atelectasis is noted. 3.1 x 1.9 cm pleural based mass is noted medially in the right upper lobe highly concerning for malignancy. This is best seen on image number 46 of series 5. 14 x 12 mm nodule is noted in right upper lobe concerning for malignancy. Mild emphysematous disease is noted in both upper lobes. Upper Abdomen: No acute abnormality. Musculoskeletal: No chest wall abnormality. No acute or significant osseous findings. Review of the MIP images confirms the above findings. IMPRESSION: No evidence of thoracic aortic dissection or aneurysm. 3.1 x 1.9 cm pleural based mass is noted medially in the right upper lobe highly concerning for malignancy. Also noted is 1.4 x 1.2 cm nodule in the right upper lobe concerning for malignancy or metastatic disease. PET scan is recommended for further evaluation. Left upper and lower lobe airspace opacities are noted concerning for pneumonia or atelectasis. Emphysema (ICD10-J43.9). Electronically Signed   By: Marijo Conception M.D.   On: 04/03/2019 11:36    Lyndee Hensen, DO PGY-1, [facility] 04/04/2019  9:01 AM PGY-1, [place] Intern pager: [phone], text pages welcome

## 2019-04-04 NOTE — Anesthesia Preprocedure Evaluation (Signed)
Anesthesia Evaluation  Patient identified by MRN, date of birth, ID band Patient awake    Reviewed: Allergy & Precautions, H&P , NPO status , Patient's Chart, lab work & pertinent test results  Airway Mallampati: II   Neck ROM: full    Dental   Pulmonary Current Smoker,  Lung mass   breath sounds clear to auscultation       Cardiovascular negative cardio ROS   Rhythm:regular Rate:Normal     Neuro/Psych PSYCHIATRIC DISORDERS Depression    GI/Hepatic   Endo/Other    Renal/GU      Musculoskeletal   Abdominal   Peds  Hematology   Anesthesia Other Findings   Reproductive/Obstetrics                             Anesthesia Physical Anesthesia Plan  ASA: II  Anesthesia Plan: General   Post-op Pain Management:    Induction: Intravenous  PONV Risk Score and Plan: 2 and Ondansetron, Dexamethasone, Midazolam and Treatment may vary due to age or medical condition  Airway Management Planned: LMA  Additional Equipment:   Intra-op Plan:   Post-operative Plan: Extubation in OR  Informed Consent: I have reviewed the patients History and Physical, chart, labs and discussed the procedure including the risks, benefits and alternatives for the proposed anesthesia with the patient or authorized representative who has indicated his/her understanding and acceptance.       Plan Discussed with: CRNA, Anesthesiologist and Surgeon  Anesthesia Plan Comments:         Anesthesia Quick Evaluation

## 2019-04-04 NOTE — Progress Notes (Signed)
Pre-op report called and given to Manuela Schwartz, RN in Short Stay. All questions answered to satisfaction. OR personnel to transport pt.

## 2019-04-04 NOTE — Op Note (Signed)
Video Bronchoscopy with Electromagnetic Navigation Procedure Note  Date of Operation: 04/04/2019  Pre-op Diagnosis: Right upper lobe mass  Post-op Diagnosis: Right upper lobe mass  Surgeon: Garner Nash, DO   Assistants: None   Anesthesia: General endotracheal anesthesia  Operation: Flexible video fiberoptic bronchoscopy with electromagnetic navigation and biopsies.  Estimated Blood Loss: Minimal, <1HE  Complications: None   Indications and History: Brenda [name] is a 59 y.o. female with right upper lobe mass, left lower lobe infiltrate.  The risks, benefits, complications, treatment options and expected outcomes were discussed with the patient.  The possibilities of pneumothorax, pneumonia, reaction to medication, pulmonary aspiration, perforation of a viscus, bleeding, failure to diagnose a condition and creating a complication requiring transfusion or operation were discussed with the patient who freely signed the consent.    Description of Procedure: The patient was seen in the Preoperative Area, was examined and was deemed appropriate to proceed.  The patient was taken to [facility], identified as Brenda [name] and the procedure verified as Flexible Video Fiberoptic Bronchoscopy.  A Time Out was held and the above information confirmed.   Prior to the date of the procedure a high-resolution CT scan of the chest was performed. Utilizing [place] a virtual tracheobronchial tree was generated to allow the creation of distinct navigation pathways to the patient's parenchymal abnormalities. After being taken to the operating room general anesthesia was initiated and the patient  was orally intubated. The video fiberoptic bronchoscope was introduced via the endotracheal tube and a general inspection was performed which showed normal right and left lung anatomy with no evidence of endobronchial tumor.  There was purulent drainage from the left lower lobe.  A left lower lobe  washing and BAL was completed and collected for culture. The extendable working channel and locator guide were introduced into the bronchoscope. The distinct navigation pathways prepared prior to this procedure were then utilized to navigate to within only 2 cm  of patient's lesion(s) identified on CT scan.  Going into the right upper lobe it appeared as if the posterior segment was the best access point.  Within the subdivisions of the posterior segment there was difficulty passing the catheter to any distance less than 2 cm from previously planned CT image.  You could tell that there was discordant view between fluoroscopy and the navigational pathway identified.  Full fluoroscopic sweep to attempt local registration would not allow due to catheter distance from expected target.  We position to the catheter closest as we possibly could into the posterior right upper lobe lesion.  The extendable working channel was secured into place and the locator guide was withdrawn. Under fluoroscopic guidance transbronchial needle brushings, transbronchial Wang needle biopsies, and transbronchial forceps biopsies were performed to be sent for cytology and pathology. A bronchioalveolar lavage was performed in the right upper lobe and sent for cytology and microbiology (bacterial, fungal, AFB smears and cultures).  At the conclusion of the navigational portion we switched from a standard therapeutic bronchoscope to a 1.7 mm working channel ultraslim Olympus bronchoscope.  Using the ultraslim scope we were able to advance into the posterior segment of the right upper lobe to approximately 6  subdivisions of the airway past the main carina.  Each of these openings were visualized and there was no obvious endobronchial tumor.  We then switched to the Olympus endobronchial ultrasound and completed inspection of the paratracheal subcarinal and hilar spaces.  All lymph nodes identified were under 7 mm in largest cross-section.  No  lymph nodes were sampled. At the end of the procedure a general airway inspection was performed and there was no evidence of active bleeding. The bronchoscope was removed.  The patient tolerated the procedure well. There was no significant blood loss and there were no obvious complications. A post-procedural chest x-ray is pending.  Samples: 1. Transbronchial needle brushings from right upper lobe 2. Transbronchial Wang needle biopsies from right upper lobe 3. Transbronchial forceps biopsies from right upper lobe 4. Bronchoalveolar lavage from right upper lobe and left lower lobe  Plans:  The patient will be discharged from the PACU to home when recovered from anesthesia and after chest x-ray is reviewed. We will review the cytology, pathology and microbiology results with the patient when they become available. Outpatient followup will be with Garner Nash, DO or Erskine Emery, MD.   All preliminary pathology with no evidence of malignancy.  Garner Nash, DO [employer] [facility] 04/04/2019 3:38 PM

## 2019-04-04 NOTE — Progress Notes (Signed)
A brief note: I have discussed the plan with the resident. I will cosign their note once completed.  The patient endorsed right rib pain s/p fall at home. She denies cough this morning, and her breathing is not labored. She does not have any questions or other concerns this morning.  Exam: Gen: frail-looking Resp/CVS: No respiratory distress. Air entry equal B/L, no wheezing. S1 S2 normal, no murmur. RRR. Chest: Right-sided tenderness. Abd: Soft, NT/ND, BS+, and normal. Ext: No edema  A/P: 59 Y/O with Acute Hypoxic Respiratory Failure: Lung mass and Comunity acquired pneumonia. Initial SPO2 was 87% on admission, and she was placed on 10 liters NRB and came up to 98. She now requires 2 L O2 saturating at 97%. For bronchoscopy today for tissue diagnosis. Oncology consult following Path report. PET as an outpatient. Pulm managing her lung mass. Rocephin and Zithromax for CAP. Monitor O2 requirements closely.  HIV screen was positive. We are awaiting a confirmatory report. ID on board.

## 2019-04-04 NOTE — Progress Notes (Addendum)
Pt returned to room 6N22 after bronchoscopy. Received report from Buna, RN in PACU. Fourth bag of potassium infusing upon return to 6N22. See reassessment. Will continue to monitor.

## 2019-04-04 NOTE — Plan of Care (Signed)
  Problem: Education: Goal: Knowledge of General Education information will improve Description: Including pain rating scale, medication(s)/side effects and non-pharmacologic comfort measures Outcome: Progressing   Problem: Health Behavior/Discharge Planning: Goal: Ability to manage health-related needs will improve Outcome: Progressing   Problem: Clinical Measurements: Goal: Ability to maintain clinical measurements within normal limits will improve Outcome: Progressing   Problem: Activity: Goal: Risk for activity intolerance will decrease Outcome: Progressing   Problem: Elimination: Goal: Will not experience complications related to urinary retention Outcome: Progressing   Problem: Pain Managment: Goal: General experience of comfort will improve Outcome: Progressing   Problem: Safety: Goal: Ability to remain free from injury will improve Outcome: Progressing   Problem: Skin Integrity: Goal: Risk for impaired skin integrity will decrease Outcome: Progressing

## 2019-04-04 NOTE — Anesthesia Procedure Notes (Signed)
Procedure Name: Intubation Date/Time: 04/04/2019 1:39 PM Performed by: Griffin Dakin, CRNA Pre-anesthesia Checklist: Patient identified, Emergency Drugs available, Suction available and Patient being monitored Patient Re-evaluated:Patient Re-evaluated prior to induction Oxygen Delivery Method: Circle system utilized Preoxygenation: Pre-oxygenation with 100% oxygen Induction Type: IV induction Ventilation: Mask ventilation without difficulty Laryngoscope Size: Mac and 3 Grade View: Grade I Tube type: Oral Tube size: 8.0 mm Number of attempts: 1 Airway Equipment and Method: Stylet Placement Confirmation: ETT inserted through vocal cords under direct vision,  positive ETCO2 and breath sounds checked- equal and bilateral Secured at: 23 cm Tube secured with: Tape Dental Injury: Teeth and Oropharynx as per pre-operative assessment

## 2019-04-04 NOTE — Evaluation (Signed)
Physical Therapy Evaluation Patient Details Name: Brenda [name] MRN: [id number] DOB: 09/29/1960 Today's Date: 04/04/2019   History of Present Illness  59 yo admitted after falling off her couch with chest pain and found to have right lung mass. PMhx: smoker 30 yrs, depression  Clinical Impression  Pt soft spoken, anxious with mobility and at rest able to maintain 92% on RA but with movement and anxiety sats decrease to 84% with return to 3L [place] and sats 95%. Pt with decreased transfers, mobility, gait and activity tolerance who will benefit from acute therapy to maximize mobility, safety and function to decrease burden of care. Pt limited by pain and hyperventilation this session and lives in a 2nd floor apartment with intermittent assist of boyfriend.     Follow Up Recommendations Home health PT    Equipment Recommendations  Rolling walker with 5" wheels    Recommendations for Other Services OT consult     Precautions / Restrictions Precautions Precautions: Fall Precaution Comments: watch sats      Mobility  Bed Mobility Overal bed mobility: Needs Assistance Bed Mobility: Rolling;Sidelying to Sit Rolling: Mod assist Sidelying to sit: Mod assist       General bed mobility comments: cues for sequence with increased time and assist of pad to rotate hips and elevate trunk from surface  Transfers Overall transfer level: Needs assistance   Transfers: Sit to/from Stand Sit to Stand: Min assist         General transfer comment: min assist to rise from surface with cues for sequence. With return to sitting pt anxious regarding pain with delayed response and processing to sitting. Pt fearful of pain for attempting sitting and required repeated multimodal cues prior to pt initiating hip flexion to sit  Ambulation/Gait Ambulation/Gait assistance: Min guard Gait Distance (Feet): 25 Feet Assistive device: None Gait Pattern/deviations: Step-through pattern;Decreased stride  length   Gait velocity interpretation: 1.31 - 2.62 ft/sec, indicative of limited community ambulator General Gait Details: pt with slow cautious steps with short strides, lack of trunk rotation and no UE movement with gait. Cues for pursed lip breathing  Stairs            Wheelchair Mobility    Modified Rankin (Stroke Patients Only)       Balance Overall balance assessment: Needs assistance   Sitting balance-Leahy Scale: Good       Standing balance-Leahy Scale: Fair Standing balance comment: pt able to stand withotu UE assist, very slow and cautious with mobility                             Pertinent Vitals/Pain Pain Assessment: 0-10 Pain Score: 6  Pain Location: right chest and left antecubital space with IV Pain Descriptors / Indicators: Aching;Burning Pain Intervention(s): Limited activity within patient's tolerance;Monitored during session;Repositioned;Other (comment)(RN notified and adjusted rate of infusion)    Home Living Family/patient expects to be discharged to:: Private residence Living Arrangements: Spouse/significant other Available Help at Discharge: Friend(s);Available PRN/intermittently Type of Home: Apartment Home Access: Stairs to enter Entrance Stairs-Rails: Right;Left;Can reach both Entrance Stairs-Number of Steps: 14 Home Layout: One level Home Equipment: None      Prior Function Level of Independence: Independent         Comments: works at [employer]        Extremity/Trunk Assessment   Upper Extremity Assessment Upper Extremity Assessment: RUE deficits/detail RUE Deficits / Details: pt not moving  RUe due to chest pain and holding Left elbow in extension due to pain at IV    Lower Extremity Assessment Lower Extremity Assessment: Generalized weakness    Cervical / Trunk Assessment Cervical / Trunk Assessment: Other exceptions Cervical / Trunk Exceptions: pt cautious and ridgid with all  movement wanting to limit all motion of trunk  Communication   Communication: No difficulties  Cognition Arousal/Alertness: Awake/alert Behavior During Therapy: Anxious;Flat affect Overall Cognitive Status: Impaired/Different from baseline                                 General Comments: pt following commands with delayed response to cues for breathing technique and safety. Pt limited by anxiety and pain with movement. PT with tendency to hyperventilate      General Comments      Exercises     Assessment/Plan    PT Assessment Patient needs continued PT services  PT Problem List Decreased strength;Decreased mobility;Decreased range of motion;Decreased activity tolerance;Decreased balance;Pain;Cardiopulmonary status limiting activity       PT Treatment Interventions DME instruction;Therapeutic exercise;Gait training;Balance training;Stair training;Functional mobility training;Cognitive remediation;Therapeutic activities;Patient/family education    PT Goals (Current goals can be found in the Care Plan section)  Acute Rehab PT Goals Patient Stated Goal: return home and to work PT Goal Formulation: With patient Time For Goal Achievement: 04/18/19 Potential to Achieve Goals: Fair    Frequency Min 3X/week   Barriers to discharge Decreased caregiver support      Co-evaluation               AM-PAC PT "6 Clicks" Mobility  Outcome Measure Help needed turning from your back to your side while in a flat bed without using bedrails?: A Lot Help needed moving from lying on your back to sitting on the side of a flat bed without using bedrails?: A Lot Help needed moving to and from a bed to a chair (including a wheelchair)?: A Little Help needed standing up from a chair using your arms (e.g., wheelchair or bedside chair)?: A Little Help needed to walk in hospital room?: A Little Help needed climbing 3-5 steps with a railing? : A Lot 6 Click Score: 15    End of  Session   Activity Tolerance: Patient limited by pain Patient left: in chair;with call bell/phone within reach;with nursing/sitter in room;with chair alarm set Nurse Communication: Mobility status PT Visit Diagnosis: Other abnormalities of gait and mobility (R26.89);History of falling (Z91.81);Unsteadiness on feet (R26.81)    Time: 1610-9604 PT Time Calculation (min) (ACUTE ONLY): 26 min   Charges:   PT Evaluation $PT Eval Moderate Complexity: 1 Mod PT Treatments $Therapeutic Activity: 8-22 mins        Kristan Brummitt P, PT Acute Rehabilitation Services Pager: [phone] Office: [phone]   Brenda [name] 04/04/2019, 8:48 AM

## 2019-04-04 NOTE — Anesthesia Postprocedure Evaluation (Signed)
Anesthesia Post Note  Patient: Brenda [name]  Procedure(s) Performed: VIDEO BRONCHOSCOPY WITH ENDOBRONCHIAL NAVIGATION (N/A ) VIDEO BRONCHOSCOPY WITH ENDOBRONCHIAL ULTRASOUND (N/A )     Patient location during evaluation: PACU Anesthesia Type: General Level of consciousness: awake and alert Pain management: pain level controlled Vital Signs Assessment: post-procedure vital signs reviewed and stable Respiratory status: spontaneous breathing, nonlabored ventilation, respiratory function stable and patient connected to nasal cannula oxygen Cardiovascular status: blood pressure returned to baseline and stable Postop Assessment: no apparent nausea or vomiting Anesthetic complications: no    Last Vitals:  Vitals:   04/04/19 1625 04/04/19 1650  BP: 123/70 121/77  Pulse: 77   Resp: 18 18  Temp: 36.6 C 37 C  SpO2: 99% 95%    Last Pain:  Vitals:   04/04/19 1650  TempSrc: Oral  PainSc: 5                  Catalina Gravel

## 2019-04-04 NOTE — Progress Notes (Signed)
Patient was seen by Dr. Valeta Harms today and is status post bronchoscopy for tissue sampling for likely lung cancer.  Per his note, patient can be discharged from the PACU.  Would need transition to p.o. antibiotics, pain assessment, and pulmonology follow-up.  P.o. antibiotics ordered and sent to transition of care pharmacy.  There are several barriers to discharge including patient's new oxygen requirement and home health services as documented by physical therapy earlier today.  Patient also needs a PCP. Home health orders have been placed. A consult to social work has been placed to help with this. Spoke with social work on the phone. The will start this process. They are unsure if they will be able to arrange all of the necessary needs today. Appreciate all of the help from everyone supporting this patient.

## 2019-04-04 NOTE — TOC Initial Note (Signed)
Transition of Care [facility]) - Initial/Assessment Note    Patient Details  Name: Brenda [name] MRN: [id number] Date of Birth: 05-Jul-1960  [facility] CM/SW Contact:    Marilu Favre, RN Phone Number: 04/04/2019, 5:17 PM  Clinical Narrative:                  Patient from home with boyfriend.   No PCP, none of the clinics are accepting new patients at this time, asked DR A. Grandville Silos if patient can follow up with Family Medicine. He will check and let NCM know. If no PCP will not be able to arrange home oxygen or home health. Dr Grandville Silos has agreed to be PCP.  Patient entered in [facility] and co pay over ride entered. TOC pharmacy brought medications to bedside.   Patient returned to 6N and is nauseated , bedside nurse is unable to obtain ambulatory saturation note .   Spoke to Kennard with [facility], if note obtained he is not sure he could get oxygen concentrator to patient's home tonight.   Referral given to Cassie with [facility] home health, her office is closed for the day she will check tomorrow to see if they can accept her.     Expected Discharge Plan: [place]     Patient Goals and CMS Choice Patient states their goals for this hospitalization and ongoing recovery are:: to return to home CMS Medicare.gov Compare Post Acute Care list provided to:: Patient Choice offered to / list presented to : Patient  Expected Discharge Plan and Services Expected Discharge Plan: [place]   Discharge Planning Services: CM Consult Post Acute Care Choice: [place] arrangements for the past 2 months: Single Family Home                 DME Arranged: Tub bench, Oxygen DME Agency: [employer] Date DME Agency Contacted: 04/04/19 Time DME Agency Contacted: [phone] Representative spoke with at DME Agency: [place]: PT, OT, Social Work [place] Date [place]: 04/04/19 Time [place]: [phone] Representative spoke with at [place]: [place]  Prior Living Arrangements/Services Living arrangements for the past 2 months: [place] Lives with:: Significant Other Patient language and need for interpreter reviewed:: Yes Do you feel safe going back to the place where you live?: Yes      Need for Family Participation in Patient Care: Yes (Comment) Care giver support system in place?: Yes (comment)   Criminal Activity/Legal Involvement Pertinent to Current Situation/Hospitalization: No - Comment as needed  Activities of Daily Living Home Assistive Devices/Equipment: Eyeglasses ADL Screening (condition at time of admission) Patient's cognitive ability adequate to safely complete daily activities?: Yes Is the patient deaf or have difficulty hearing?: No Does the patient have difficulty seeing, even when wearing glasses/contacts?: No Does the patient have difficulty concentrating, remembering, or making decisions?: No Patient able to express need for assistance with ADLs?: Yes Does the patient have difficulty dressing or bathing?: No Independently performs ADLs?: Yes (appropriate for developmental age) Does the patient have difficulty walking or climbing stairs?: No Weakness of Legs: None Weakness of Arms/Hands: None  Permission Sought/Granted   Permission granted to share information with : No              Emotional Assessment Appearance:: Appears stated age Attitude/Demeanor/Rapport: Engaged Affect (typically observed): Accepting Orientation: : Oriented to Situation, Oriented to  Time, Oriented to Place, Oriented  to Self Alcohol / Substance Use: Not Applicable Psych Involvement: No (comment)  Admission diagnosis:  Acute respiratory failure (HCC) [J96.00] Lung mass [R91.8] Acute respiratory failure with hypoxia (HCC) [J96.01] Multifocal pneumonia [J18.9] Patient Active Problem List   Diagnosis Date Noted  . Acute respiratory failure ([place])  04/03/2019  . Lung mass   . Multifocal pneumonia   . Major depressive disorder   . Major depressive disorder, recurrent episode with mood-congruent psychotic features ([place]) 05/30/2017  . 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 [place] [address] Phone: [phone] Fax: [phone]  [facility] [place], [place] [address] [place] [address] Phone: [phone] Fax: [phone]     Social Determinants of Health (SDOH) Interventions    Readmission Risk Interventions No flowsheet data found.

## 2019-04-04 NOTE — Evaluation (Signed)
Occupational Therapy Evaluation Patient Details Name: Brenda [name] MRN: [id number] DOB: Jul 01, 1960 Today's Date: 04/04/2019    History of Present Illness 59 yo admitted after falling off her couch with chest pain and found to have right lung mass. PMhx: smoker 30 yrs, depression   Clinical Impression   Patient is a 59 year old female that lives with significant other in a motel with full flight to her room. Patient is independent at baseline, works at [employer]. Currently due to deficits listed below patient is set up for UB ADLs in sitting, min A for functional mobility/transfer and mod A for LB ADLs. Patient main limitation is pain level in R flank, patient medicated prior to mobility. Continue to recommend acute OT skilled services in order to maximize patient safety and independence with self care.    Follow Up Recommendations  Home health OT;Supervision/Assistance - 24 hour    Equipment Recommendations  Tub/shower bench       Precautions / Restrictions Precautions Precautions: Fall Precaution Comments: watch sats Restrictions Weight Bearing Restrictions: No      Mobility Bed Mobility Overal bed mobility: Needs Assistance Bed Mobility: Rolling;Sidelying to Sit Rolling: Mod assist Sidelying to sit: Mod assist       General bed mobility comments: seated in chair upon arrival  Transfers Overall transfer level: Needs assistance Equipment used: None Transfers: Sit to/from Stand Sit to Stand: Min assist         General transfer comment: verbal cues for sequencing body mechanics    Balance Overall balance assessment: Needs assistance Sitting-balance support: Feet supported;No upper extremity supported Sitting balance-Leahy Scale: Good     Standing balance support: No upper extremity supported;During functional activity Standing balance-Leahy Scale: Fair Standing balance comment: pt able to stand withotu UE assist, very slow and cautious with mobility                            ADL either performed or assessed with clinical judgement   ADL Overall ADL's : Needs assistance/impaired Eating/Feeding: Independent;Sitting   Grooming: Set up;Sitting;Wash/dry face;Oral care;Wash/dry hands   Upper Body Bathing: Sitting;Minimal assistance Upper Body Bathing Details (indicate cue type and reason): due to pain in R flank Lower Body Bathing: Moderate assistance;Sitting/lateral leans;Sit to/from stand   Upper Body Dressing : Sitting;Minimal assistance Upper Body Dressing Details (indicate cue type and reason): due to pain in R flank Lower Body Dressing: Moderate assistance;Sitting/lateral leans Lower Body Dressing Details (indicate cue type and reason): patient able to cross R LE over L for doff/don sock, unable to cross L LE over R due to pain in R flank Toilet Transfer: BSC;Ambulation;Minimal assistance Toilet Transfer Details (indicate cue type and reason): simulated sit <> stand from recliner Toileting- Clothing Manipulation and Hygiene: Minimal assistance;Sit to/from stand       Functional mobility during ADLs: Minimal assistance General ADL Comments: patient requires increased time for all movement due to pain in R flank, increased assist for LB self care due to pain with trunk flexion                  Pertinent Vitals/Pain Pain Assessment: 0-10 Pain Score: 6  Pain Location: right chest and left antecubital space with IV Pain Descriptors / Indicators: Aching;Burning Pain Intervention(s): Monitored during session;Premedicated before session     Hand Dominance Right   Extremity/Trunk Assessment Upper Extremity Assessment Upper Extremity Assessment: RUE deficits/detail RUE Deficits / Details: pt not moving RUe due to chest  pain and holding Left elbow in extension due to pain at IV RUE: Unable to fully assess due to pain   Lower Extremity Assessment Lower Extremity Assessment: Defer to PT evaluation   Cervical / Trunk  Assessment Cervical / Trunk Assessment: Other exceptions Cervical / Trunk Exceptions: pt cautious and ridgid with all movement wanting to limit all motion of trunk   Communication Communication Communication: No difficulties   Cognition Arousal/Alertness: Awake/alert Behavior During Therapy: Flat affect Overall Cognitive Status: No family/caregiver present to determine baseline cognitive functioning                                 General Comments: patient follows commands with increased time   General Comments  pt doff O2 for seated g/h fluctuate from 89-90% on room air, donned O2 once g/h completed with 95% on O2            Home Living Family/patient expects to be discharged to:: Private residence Living Arrangements: Spouse/significant other Available Help at Discharge: Friend(s);Available PRN/intermittently Type of Home: Other(Comment)(Motel) Home Access: Stairs to enter Entrance Stairs-Number of Steps: 14 Entrance Stairs-Rails: Right;Left;Can reach both Home Layout: One level     Bathroom Shower/Tub: Teacher, early years/pre: Standard     Home Equipment: None          Prior Functioning/Environment Level of Independence: Independent        Comments: works at [employer] Problem List: Decreased strength;Decreased activity tolerance;Impaired balance (sitting and/or standing);Decreased safety awareness;Decreased knowledge of use of DME or AE;Pain      OT Treatment/Interventions: Self-care/ADL training;Therapeutic exercise;Energy conservation;DME and/or AE instruction;Therapeutic activities;Patient/family education;Balance training    OT Goals(Current goals can be found in the care plan section) Acute Rehab OT Goals Patient Stated Goal: return home and to work OT Goal Formulation: With patient Time For Goal Achievement: 04/18/19 Potential to Achieve Goals: Good  OT Frequency: Min 2X/week    AM-PAC OT "6 Clicks" Daily  Activity     Outcome Measure Help from another person eating meals?: None Help from another person taking care of personal grooming?: A Little Help from another person toileting, which includes using toliet, bedpan, or urinal?: A Little Help from another person bathing (including washing, rinsing, drying)?: A Lot Help from another person to put on and taking off regular upper body clothing?: A Little Help from another person to put on and taking off regular lower body clothing?: A Lot 6 Click Score: 17   End of Session Equipment Utilized During Treatment: Oxygen Nurse Communication: Mobility status  Activity Tolerance: Patient limited by pain Patient left: in chair;with call bell/phone within reach;with chair alarm set  OT Visit Diagnosis: Other abnormalities of gait and mobility (R26.89);Muscle weakness (generalized) (M62.81);History of falling (Z91.81);Pain Pain - Right/Left: Right Pain - part of body: (Flank)                Time: 6468-0321 OT Time Calculation (min): 28 min Charges:  OT General Charges $OT Visit: 1 Visit OT Evaluation $OT Eval Moderate Complexity: 1 Mod OT Treatments $Self Care/Home Management : 8-22 mins  Shon Millet OT OT office: [place] 04/04/2019, 12:30 PM

## 2019-04-04 NOTE — Plan of Care (Signed)
  Problem: Education: Goal: Knowledge of General Education information will improve Description Including pain rating scale, medication(s)/side effects and non-pharmacologic comfort measures Outcome: Progressing   

## 2019-04-04 NOTE — Progress Notes (Addendum)
Late entry: FPTS Interim Progress Note  S: Patient seen after procedure as RN paged with concerns for uncontrolled pain.   O: BP 121/77 (BP Location: Right Arm)   Pulse 77   Temp 98.6 F (37 C) (Oral)   Resp 18   Ht 5\' 6"  (1.676 m)   Wt 54.4 kg   LMP 01/19/2011   SpO2 95%   BMI 19.36 kg/m    URK:YHCWCBJ conversing and laughing occasionally with boyfriend, eating her meal   A/P: Patient received fentanyl prior to my arrival.  Pain appears well controlled with current regimen.   Lyndee Hensen, DO 04/04/2019, 6:07 PM PGY-1, [facility] Service pager [phone]

## 2019-04-04 NOTE — Transfer of Care (Signed)
Immediate Anesthesia Transfer of Care Note  Patient: Brenda [name]  Procedure(s) Performed: VIDEO BRONCHOSCOPY WITH ENDOBRONCHIAL NAVIGATION (N/A ) VIDEO BRONCHOSCOPY WITH ENDOBRONCHIAL ULTRASOUND (N/A )  Patient Location: PACU  Anesthesia Type:General  Level of Consciousness: awake, alert , oriented and patient cooperative  Airway & Oxygen Therapy: Patient Spontanous Breathing and Patient connected to nasal cannula oxygen  Post-op Assessment: Report given to RN and Post -op Vital signs reviewed and stable  Post vital signs: Reviewed and stable  Last Vitals:  Vitals Value Taken Time  BP    Temp    Pulse 98 04/04/19 1540  Resp 20 04/04/19 1540  SpO2 96 % 04/04/19 1540  Vitals shown include unvalidated device data.  Last Pain:  Vitals:   04/04/19 1205  TempSrc: Oral  PainSc:          Complications: No apparent anesthesia complications

## 2019-04-05 ENCOUNTER — Telehealth: Payer: Self-pay | Admitting: *Deleted

## 2019-04-05 ENCOUNTER — Other Ambulatory Visit: Payer: Self-pay | Admitting: Family Medicine

## 2019-04-05 ENCOUNTER — Telehealth: Payer: Self-pay | Admitting: Family Medicine

## 2019-04-05 DIAGNOSIS — J9601 Acute respiratory failure with hypoxia: Secondary | ICD-10-CM

## 2019-04-05 DIAGNOSIS — Z21 Asymptomatic human immunodeficiency virus [HIV] infection status: Secondary | ICD-10-CM

## 2019-04-05 LAB — HEPATITIS PANEL, ACUTE
HCV Ab: NONREACTIVE
Hep A IgM: NONREACTIVE
Hep B C IgM: NONREACTIVE
Hepatitis B Surface Ag: NONREACTIVE

## 2019-04-05 LAB — PANEL 083904
HIV 1 AB: NEGATIVE
HIV 2 AB: NEGATIVE
Note: NEGATIVE

## 2019-04-05 LAB — HEPATITIS C ANTIBODY (REFLEX): HCV Ab: <0.1 s/co ratio (ref 0.0–0.9)

## 2019-04-05 LAB — SURGICAL PATHOLOGY

## 2019-04-05 LAB — HEPATITIS B SURFACE ANTIBODY, QUANTITATIVE: Hep B S AB Quant (Post): <3.1 m[IU]/mL — ABNORMAL LOW (ref >9.9)

## 2019-04-05 LAB — HIV-1 RNA QUANT-NO REFLEX-BLD
HIV 1 RNA Quant: <20 copies/mL
LOG10 HIV-1 RNA: UNDETERMINED log10copy/mL

## 2019-04-05 LAB — HCV COMMENT:

## 2019-04-05 LAB — CYTOLOGY - NON PAP

## 2019-04-05 MED ORDER — GUAIFENESIN ER 600 MG PO TB12
600.0000 mg | ORAL_TABLET | Freq: Two times a day (BID) | ORAL | Status: DC | PRN
  Administered 2019-04-05: 12:00:00 600 mg via ORAL
  Filled 2019-04-05: qty 1

## 2019-04-05 MED ORDER — CEFDINIR 300 MG PO CAPS: 300.0000 mg | ORAL_CAPSULE | Freq: Two times a day (BID) | ORAL | 0 refills | Status: AC

## 2019-04-05 MED ORDER — OXYCODONE HCL 5 MG PO TABS: 5.0000 mg | ORAL_TABLET | Freq: Four times a day (QID) | ORAL | 0 refills | Status: AC | PRN

## 2019-04-05 MED ORDER — CEFDINIR 300 MG PO CAPS
300.0000 mg | ORAL_CAPSULE | Freq: Two times a day (BID) | ORAL | Status: DC
  Administered 2019-04-05: 300 mg via ORAL
  Filled 2019-04-05 (×2): qty 1

## 2019-04-05 MED ORDER — AZITHROMYCIN 250 MG PO TABS
500.0000 mg | ORAL_TABLET | Freq: Once | ORAL | Status: AC
  Administered 2019-04-05: 12:00:00 500 mg via ORAL
  Filled 2019-04-05: qty 2

## 2019-04-05 MED ORDER — GUAIFENESIN ER 600 MG PO TB12: 600.0000 mg | ORAL_TABLET | Freq: Two times a day (BID) | ORAL | Status: DC | PRN

## 2019-04-05 MED FILL — oxyCODONE HCL 5 MG TABS: 5 | 5 days supply | Qty: 20 | Fill #0

## 2019-04-05 NOTE — Discharge Instructions (Addendum)
You were hospitalized at [facility] due to shortness of breath.  We expect this is from pneumonia and lung masses found on imaging  which improved after IV and oral antibiotics.   We are so glad you are feeling better.  You have an appointment set up on Friday at 9:30 AM with Dr. Enid Derry at the [facility]. Be sure to keep this appointment.  Thank you for allowing [place] to take care of you.  - Please take all of your medication as prescribed  - Follow up with Dr. Enid Derry on Friday at 9:30 AM at [address] [place], [place] [address].    IF you become increasingly short of breath or have a fever >102 F.  Please seek urgent medical health care.    Take care, [facility] family medicine team     Flexible Bronchoscopy, Care After This sheet gives you information about how to care for yourself after your test. Your doctor may also give you more specific instructions. If you have problems or questions, contact your doctor. Follow these instructions at home: Eating and drinking  The day after the test, go back to your normal diet. Driving  Do not drive for 24 hours if you were given a medicine to help you relax (sedative).  Do not drive or use heavy machinery while taking prescription pain medicine. General instructions   Take over-the-counter and prescription medicines only as told by your doctor.  Return to your normal activities as told. Ask what activities are safe for you.  Do not use any products that have nicotine or tobacco in them. This includes cigarettes and e-cigarettes. If you need help quitting, ask your doctor.  Keep all follow-up visits as told by your doctor. This is important. It is very important if you had a tissue sample (biopsy) taken. Get help right away if:  You have shortness of breath that gets worse.  You get light-headed.  You feel like you are going to pass out (faint).  You have chest pain.  You cough up: ? More than a little  blood. ? More blood than before. Summary  Do not eat or drink anything (not even water) for 2 hours after your test, or until your numbing medicine wears off.  Do not use cigarettes. Do not use e-cigarettes.  Get help right away if you have chest pain. This information is not intended to replace advice given to you by your health care provider. Make sure you discuss any questions you have with your health care provider. Document Revised: 02/19/2017 Document Reviewed: 03/27/2016 Elsevier Patient Education  2020 [employer].

## 2019-04-05 NOTE — Telephone Encounter (Signed)
LMTCB and will hold in triage since appt needed soon and front staff short

## 2019-04-05 NOTE — Progress Notes (Signed)
FPTS Interim Progress Note  Patient informed of her positive screening result.  Confirmatory test is pending.  She stated she has never had an HIV test that she was normal before.  Recalls having a blood transfusion when she was younger.  States that her current boyfriend has been rumored to be with " girls of the night."  Denies IV drug use history.  Patient to follow-up in the [facility] clinic on Friday.  Hopeful that a confirmatory test will be back by then.  If positive, she knows that she will need to follow-up with infectious disease.  All questions asked and answered.   Lyndee Hensen, DO 04/05/2019, 3:43 PM PGY-1, [facility] Service pager [phone]

## 2019-04-05 NOTE — Progress Notes (Signed)
Family Medicine Teaching Service Daily Progress Note Intern Pager: [phone]  Patient name: Brenda [name] Medical record number: [id number] Date of birth: [birth year] Age: 59 y.o. Gender: female  Primary Care Provider: Lyndee Hensen, MD Consultants: Pulmonology  Code Status: Full   Pt Overview and Major Events to Date:  04/03/19: Admitted, pulmonology consulted for lung masses 04/04/19: Bronchoscopy   Assessment and Plan: [name] is a 59 y.o. female who presented with acute respiratory failure satting in mid 80% on RA. PMH is significant for MDD, EtOH abuse   Acute Hypoxic Respiratory Failure  Multiple Lung Masses  CAP  Patient had bronchoscopy yesterday and will follow up outpaitent with pulmonology.  Concern for malignancy remains high.  Satting well on room air.  No home oxygen required.  Tenuous to be afebrile.  Patient is stable for discharge home today. - Pulmonology signed off, appreciate recommendations - Continuous Pulse Oxymetry  - Incentive spirometry - PRN oxycodone for moderate pain, Fentanyl for severe pain,  - PRN Valium   - CTX and Azithromycin (1/11 - )  - Consider PET scan  - Q4H Vitals  - AM CBC, BMP  - PT/OT : HH PT / OT; rolling walker w/ 5" wheels, tub/shower bench      HIV + screening  Fourth-generation HIV screen reactive.  HIV RNA  pending.  If confirmatory testing positive will need follow-up with infectious disease.  Will speak with patient regarding her positive HIV screening today.  - Follow up acute Hep panel, Hep B/C - Follow up HIV RNA and CD4 count  - Follow up UDS  History of EtOH abuse  Drinks a 6-pk on the weekend.  - Monitor CIWAs   Major Depression Disorder  Stable. Patient follows with [facility]. Denies taking any medication for depression. Denies SI, HI, auditory and visual hallucinations.  - Monitor mood    Hx of Acid Reflux  Patient states she takes no medication for this.  Has taken Pepcid in the past.     FEN/GI: NPO pending procedure  PPx: Lovenox to start after procedure   Disposition: likely home with home health   Subjective:  Brenda [name] denies shortness of breath.  Continues to have right rib pain   Objective: Temp:  [97.6 F (36.4 C)-98.7 F (37.1 C)] 98.3 F (36.8 C) (01/13 0524) Pulse Rate:  [77-102] 79 (01/13 0524) Resp:  [18-20] 18 (01/13 0524) BP: (97-145)/(70-84) 137/84 (01/13 0524) SpO2:  [93 %-100 %] 98 % (01/13 0524)  Physical Exam: GEN: Smiling on exam, in no acute distress CV: regular rate and rhythm, no murmurs appreciated RESP: no increased work of breathing, clear to ascultation bilaterally with no crackles, wheezes, or rhonchi  ABD: Bowel sounds present. Soft, Nontender, Nondistended. MSK: no lower extremity edema, normal range of motion SKIN: warm, dry NEURO: grossly normal, moves all extremities appropriately PSYCH: Normal affect and thought content   Laboratory: Recent Labs  Lab 04/03/19 0651 04/04/19 0228  WBC 7.0 11.5*  HGB 14.0 11.8*  HCT 40.5 35.3*  PLT 239 201   Recent Labs  Lab 04/03/19 0651 04/04/19 0228  NA 135 134*  K 3.4* 3.4*  CL 99 99  CO2 26 26  BUN <5* 10  CREATININE 0.54 0.63  CALCIUM 8.9 8.8*  PROT  --  6.1*  BILITOT  --  0.8  ALKPHOS  --  83  ALT  --  11  AST  --  13*  GLUCOSE 116* 111*      Imaging/Diagnostic Tests: DG  CHEST PORT 1 VIEW  Result Date: 04/04/2019 CLINICAL DATA:  Postoperative video bronchoscopy with biopsies, rule out pneumothorax S patient is complaining of right-sided rib pain EXAM: PORTABLE CHEST 1 VIEW COMPARISON:  CT angiography 04/03/2019, radiographs 04/03/2019 cough I am going to all aunt FINDINGS: Markedly increased interstitial opacity throughout both lungs most focally in the right upper lobe. Multiple masslike opacities throughout the lungs are better seen on comparison CT from 1 day prior. No visible pneumothorax. Trace bilateral effusions. Cardiomediastinal contours are  unchanged. No acute osseous or soft tissue abnormality. Degenerative changes are present in the imaged spine and shoulders. IMPRESSION: 1. No pneumothorax. 2. Markedly increased interstitial opacity throughout both lungs following bronchoscopy, most focally in the right upper lobe. 3. Multiple masslike opacities throughout the lungs are better seen on comparison CT. Electronically Signed   By: Lovena Le M.D.   On: 04/04/2019 16:10   DG C-ARM BRONCHOSCOPY  Result Date: 04/04/2019 C-ARM BRONCHOSCOPY: Fluoroscopy was utilized by the requesting physician.  No radiographic interpretation.    Lyndee Hensen, DO PGY-1, [facility] 04/05/2019  12:03 PM PGY-1, [place] Intern pager: [phone], text pages welcome

## 2019-04-05 NOTE — Progress Notes (Signed)
It appears that the patient was d/c home without O2, although, PT documented O2 Sat of 88%. I discussed with Dr. Higinio Plan. She will reach out to the patient and advise her to return to the hospital if having respiratory distress, otherwise, we will get CCM involved to organize home O2.

## 2019-04-05 NOTE — Progress Notes (Signed)
Physical Therapy Treatment Patient Details Name: Brenda [name] MRN: [id number] DOB: 1960/04/13 Today's Date: 04/05/2019    History of Present Illness 59 yo admitted after falling off her couch with chest pain and found to have right lung mass. PMhx: smoker 30 yrs, depression    PT Comments    Pt increased ambulation distance and negotiated steps. She is progressing well towards goals. Session performed on RA. No c/o SOB. SpO2 monitoted on second gait trial. SpO2 at 94% at rest on RA. SpO2 remained >90 for the first 120 ft, then dropped to 88% and remained there until pt took seated rest brake. No symptoms of low O2. HHPT expected to follow up with pt at d/c. Will continue to follow acutely.      Follow Up Recommendations  Home health PT     Equipment Recommendations  Rolling walker with 5" wheels    Recommendations for Other Services OT consult     Precautions / Restrictions Precautions Precautions: Fall Precaution Comments: watch sats Restrictions Weight Bearing Restrictions: No    Mobility  Bed Mobility Overal bed mobility: Needs Assistance Bed Mobility: Rolling;Sidelying to Sit Rolling: Min guard Sidelying to sit: Min guard       General bed mobility comments: min guard for safety. No assist needed  Transfers Overall transfer level: Needs assistance Equipment used: None Transfers: Sit to/from Stand Sit to Stand: Min guard         General transfer comment: min guard for safety. VC for hand placement.  Ambulation/Gait Ambulation/Gait assistance: Min guard Gait Distance (Feet): 200 Feet(200x1 150x1) Assistive device: IV Pole Gait Pattern/deviations: Step-through pattern;Decreased stride length     General Gait Details: Pt using IV pole off and on for stability. Slow guarded gait secondary to pain on R side of trunk. Cues for PLB    Stairs Stairs: Yes Stairs assistance: Min guard Stair Management: One rail Left Number of Stairs: 8 General stair  comments: Pt negotiated steps with min guard for safety. VC for safety with line management.   Wheelchair Mobility    Modified Rankin (Stroke Patients Only)       Balance Overall balance assessment: Needs assistance Sitting-balance support: Feet supported;No upper extremity supported Sitting balance-Leahy Scale: Good     Standing balance support: No upper extremity supported;During functional activity Standing balance-Leahy Scale: Fair Standing balance comment: pt able to stand withotu UE assist, very slow and cautious with mobility                            Cognition Arousal/Alertness: Awake/alert Behavior During Therapy: Flat affect Overall Cognitive Status: No family/caregiver present to determine baseline cognitive functioning                                 General Comments: follows commands consistantly.      Exercises      General Comments General comments (skin integrity, edema, etc.): assisted to restroom at beginning of session. Pt independent with peri care      Pertinent Vitals/Pain Pain Assessment: Faces Faces Pain Scale: Hurts little more Pain Location: right chest and left antecubital space with IV Pain Descriptors / Indicators: Aching;Burning    Home Living                      Prior Function            PT  Goals (current goals can now be found in the care plan section) Acute Rehab PT Goals Patient Stated Goal: return home and to work PT Goal Formulation: With patient Time For Goal Achievement: 04/18/19 Potential to Achieve Goals: Fair Progress towards PT goals: Progressing toward goals    Frequency    Min 3X/week      PT Plan Current plan remains appropriate    Co-evaluation              AM-PAC PT "6 Clicks" Mobility   Outcome Measure  Help needed turning from your back to your side while in a flat bed without using bedrails?: A Little Help needed moving from lying on your back to sitting  on the side of a flat bed without using bedrails?: A Little Help needed moving to and from a bed to a chair (including a wheelchair)?: A Little Help needed standing up from a chair using your arms (e.g., wheelchair or bedside chair)?: A Little Help needed to walk in hospital room?: A Little Help needed climbing 3-5 steps with a railing? : A Little 6 Click Score: 18    End of Session Equipment Utilized During Treatment: Gait belt Activity Tolerance: Patient tolerated treatment well Patient left: in chair;with call bell/phone within reach Nurse Communication: Mobility status(o2 levels) PT Visit Diagnosis: Other abnormalities of gait and mobility (R26.89);History of falling (Z91.81);Unsteadiness on feet (R26.81)     Time: 1345-1430 PT Time Calculation (min) (ACUTE ONLY): 45 min  Charges:  $Gait Training: 23-37 mins $Therapeutic Activity: 8-22 mins                     Benjiman Core, [place] Pager [phone] Acute Rehab    Allena Katz 04/05/2019, 2:41 PM

## 2019-04-05 NOTE — Discharge Summary (Addendum)
Brenda [name]  Patient name: Brenda [name] Medical record number: [id number] Date of birth: [birth year] Age: 59 y.o. 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 Soft, Nontender, Nondistended. MSK: no lower extremity edema, normal range of motion SKIN: warm, dry NEURO: grossly normal, moves all extremities appropriately PSYCH: Normal affect and thought content Taken from Dr. Birdena Crandall progress note on 04/05/19   Brief Hospital Course:    Acute Respiratory Failure  Lung Mass  CAP  Brenda [name] is a 59 y.o. female presented with acute hypoxic respiratory failure after fall at home.  Patient started on nonrebreather and was weaned to 2 L nasal cannula.  Patient had significant right sided rib pain after hitting wooden table during the fall.  Unilateral rib series not indicate acute fracture.  CT chest was was obtained which ruled out PE however no focal pneumonia was seen as well as a 3.1 x 1.9 cm pleural based mass and 1.4 x 1.2 cm nodule was noted in the right upper lobe.  Pulmonology saw patient and recommended bronchoscopy. Patient had bronchoscopy on 04/04/2019.  Pulmonology office to schedule  outpatient follow-up with patient.  She was treated for community acquired pneumonia with IV ceftriaxone and azithromycin and transitioned to oral therapy (cefdinir and azithromycin) to end of 04/08/2019.  Patient discharged with home health PT and OT. DME orders were placed for walker and shower bench.  Patient was on room air however desatted prior to discharge with ambulation.  Patient qualified for home oxygen and DME orders were placed for this.  HIV + screening test Fourth-generation HIV screen was reactive.  HIV RNA  pending. CD4 count 510.  If confirmatory testing positive, pt will need follow-up with infectious disease.      Issues for Follow Up:  Reassess patient's respiratory status. Ensure that patient has an appropriate pulmonology follow-up for lung mass pathology results. Review HIV RNA and confirmatory testing with patient.  If positive, patient will need referral to infectious disease. Patient hep B was nonimmune and will need vaccination.  Significant Procedures: Bronchoscopy  Significant Labs and Imaging:  Recent Labs  Lab 04/03/19 0651 04/04/19 0228  WBC 7.0 11.5*  HGB 14.0 11.8*  HCT 40.5 35.3*  PLT 239 201   Recent Labs  Lab 04/03/19 0651 04/03/19 1722 04/04/19 0228  NA 135  --  134*  K 3.4*  --  3.4*  CL 99  --  99  CO2 26  --  26  GLUCOSE 116*  --  111*  BUN <5*  --  10  CREATININE 0.54  --  0.63  CALCIUM 8.9  --  8.8*  MG  --  1.8  --   PHOS  --  2.6  --   ALKPHOS  --   --  83  AST  --   --  13*  ALT  --   --  11  ALBUMIN  --   --  2.7*     Results/Tests Pending at Time of Discharge: HIV confirmatory tests, lung mass pathology   Discharge Medications:  Allergies as of 04/05/2019       Reactions   Aspirin Adult Low [aspirin]    Stomach upset        Medication List     STOP taking these medications    GOODY HEADACHE PO   Lubricating Eye Drops 0.5-0.9 % ophthalmic solution Generic drug: carboxymethylcellul-glycerin        TAKE these medications    azithromycin 250 MG tablet Commonly known as: Zithromax Take 1 tablet (250 mg total) by mouth daily for 4 days.   cefdinir 300 MG capsule Commonly known as: OMNICEF Take 1 capsule (300 mg total) by mouth 2 (two) times daily for 3 days.   famotidine 20 MG tablet Commonly known as: PEPCID Take 1 tablet (20 mg total) by mouth 2 (two) times daily as needed for heartburn or indigestion.   guaiFENesin 600 MG 12 hr tablet Commonly known as: MUCINEX Take 1 tablet (600 mg total) by mouth 2 (two) times daily as needed for cough or to loosen phlegm.   oxyCODONE 5 MG immediate release tablet Commonly known as: Oxy IR/ROXICODONE Take 1 tablet (5 mg total) by mouth every 6 (six) hours as needed for up to 5 days for moderate pain.        Discharge Instructions: Please refer to Patient Instructions section of EMR for full details.  Patient was counseled important signs and symptoms that should prompt return to medical care, changes in medications, dietary instructions, activity restrictions, and follow up appointments.   Follow-Up Appointments: Follow-up Information     Candee Furbish, MD In 4 weeks.   Specialty: Pulmonary Disease Contact information: [address] [place] [address] [phone]         [facility] Follow up.   Specialty: Home Health Services Contact information: [place] [address] [phone]         [name], [name], DO. Go on 04/07/2019.   Specialty: Family Medicine Why: 9:30 Contact information: [address] [place] [address] [phone]            Lyndee Hensen, DO  04/06/2019, 5:46 PM

## 2019-04-05 NOTE — TOC Progression Note (Addendum)
Transition of Care [facility]) - Progression Note    Patient Details  Name: Brenda [name] MRN: [id number] Date of Birth: February 12, 1961  Transition of Care [facility]) CM/SW Contact  Saranda Legrande, Edson Snowball, RN Phone Number: 04/05/2019, 11:05 AM  Clinical Narrative:     Rubin Payor with [facility] has declined home health referral.   Spoke with patient regarding [facility] PT at [facility], she is interested in OP PT and has transportation will discuss with MD . Paged family medicine regarding , she did not qualify for home oxygen and unable to arrange HHPT. Spoke with Dr Rosita Fire they will cancel home oxygen order and enter order for OP PT at [facility] location.  Called Zack with [facility] for tub bench and update on oxygen   Expected Discharge Plan: [place]    Expected Discharge Plan and Services Expected Discharge Plan: [place]   Discharge Planning Services: CM Consult Post Acute Care Choice: [place] arrangements for the past 2 months: Single Family Home                 DME Arranged: Tub bench, Oxygen DME Agency: [facility] Date DME Agency Contacted: 04/04/19 Time DME Agency Contacted: [id number] Representative spoke with at DME Agency: [place]: PT, OT, Social Work [employer] Agency: [facility] [place] Date [employer]: 04/04/19 Time [place]: [id number] Representative spoke with at [place]: [place] Determinants of Health ([place]) Interventions    Readmission Risk Interventions No flowsheet data found.

## 2019-04-05 NOTE — Progress Notes (Signed)
NAME:  Brenda [name], MRN:  [id number], DOB:  [birth year], LOS: 2 ADMISSION DATE:  04/03/2019, CONSULTATION DATE:  04/03/19 REFERRING MD:  Ardelia Mems, CHIEF COMPLAINT:  Back pain   Brief History   59 year old smoker presenting after fall with musculoskeletal pain found to have a very abnormal CT scan of chest.  History of present illness   21 year ol2d smoker presenting after fall with right back musculoskeletal pain found to have left sided airspace disease and two discrete R sided lung masses/nodules.  Patient denies any back pain prior to this morning when she rolled off the couch in her sleep and hit her right back.  Since this fall she has had a lot of pleurisy.  This led to a rib x-ray which showed a right nodule versus mass which led to the CT scan above.  Patient smokes about a third of a pack a day.  She has been smoking for the past 30 years.  She has never had a colonoscopy nor a mammogram.  She has no other medical problems that she knows of.  She states she only drinks on weekends and has never had alcohol withdrawal.  She denies any recent weight loss, fever, chills, sick contact.  She has a daily cough productive of clear to yellow sputum which is unchanged.  Past Medical History  Depression  Significant Hospital Events   04/03/2019 admitted  Consults:  PCCM  Procedures:  None  Significant Diagnostic Tests:   CTA chest IMPRESSION: No evidence of thoracic aortic dissection or aneurysm. 3.1 x 1.9 cm pleural based mass is noted medially in the right upper lobe highly concerning for malignancy. Also noted is 1.4 x 1.2 cm nodule in the right upper lobe concerning for malignancy or metastatic disease. PET scan is recommended for further evaluation Left upper and lower lobe airspace opacities are noted concerning for pneumonia or atelectasis. Emphysema (ICD10-J43.9).  Micro Data:  COVID neg Pct >>   Antimicrobials:  Ceftriaxone >> Azithromycin>>   Interim  history/subjective:  No events, feels well, no need for O2.  Denies sore throat, fevers, chills, hemoptysis.  Objective   Blood pressure 137/84, pulse 79, temperature 98.3 F (36.8 C), temperature source Oral, resp. rate 18, height 5\' 6"  (1.676 m), weight 54.4 kg, last menstrual period 01/19/2011, SpO2 98 %.        Intake/Output Summary (Last 24 hours) at 04/05/2019 1241 Last data filed at 04/05/2019 1211 Gross per 24 hour  Intake 1751 ml  Output 204 ml  Net 1547 ml   Filed Weights   04/03/19 0630 04/03/19 2057  Weight: 57.2 kg 54.4 kg    Examination: GEN: middle age woman in NAD HEENT: MMM, no thrush CV: RRR, ext warm PULM: Scattered rhonci on L, no accessory muscle use GI: Soft, +BS EXT: No edema NEURO: Moves all 4 ext to command PSYCH: Fair insight SKIN: No rashes     Resolved Hospital Problem list   N/A  Assessment & Plan:   # Abnormal CT scan in smoker # Right-sided pleurisy # Right upper lobe pleural based mass  # RUL nodule, smaller on fissure line, possible metastatic focus  # LLL infiltrate, possible pneumonia, vs obstructive  # HIV + screen  Post bronchoscopy by Icard 04/04/19.  No immediate diagnosis available but probably unable to get to lesions on R due to location.  Left side looked infectious.  Plan: -F/u HIV confirmatory tests -F/u bronchoscopy labs including culture and cytology -Fine to narrow antibiotics to  either augmentin or cefdinir to complete 7 day course -Will need close OP f/u with [place] +/- ID depending on confirmatory testing (I will send message to our scheduler for our followup which will be either Mon/Tues next week) -If cytology unrevealing will likely proceed with OP PET/CT to determine optimal location for percutaneous biopsy which unfortunately might be the RUL para-spinal lesion. -Please reach out with any disposition issues  Labs   CBC: Recent Labs  Lab 04/03/19 0651 04/04/19 0228  WBC 7.0 11.5*  NEUTROABS 5.7  --   HGB  14.0 11.8*  HCT 40.5 35.3*  MCV 95.7 97.2  PLT 239 885    Basic Metabolic Panel: Recent Labs  Lab 04/03/19 0651 04/03/19 1722 04/04/19 0228  NA 135  --  134*  K 3.4*  --  3.4*  CL 99  --  99  CO2 26  --  26  GLUCOSE 116*  --  111*  BUN <5*  --  10  CREATININE 0.54  --  0.63  CALCIUM 8.9  --  8.8*  MG  --  1.8  --   PHOS  --  2.6  --    GFR: Estimated Creatinine Clearance: 67.4 mL/min (by C-G formula based on SCr of 0.63 mg/dL). Recent Labs  Lab 04/03/19 0651 04/03/19 1722 04/04/19 0228  PROCALCITON  --  <0.10  --   WBC 7.0  --  11.5*    Liver Function Tests: Recent Labs  Lab 04/04/19 0228  AST 13*  ALT 11  ALKPHOS 83  BILITOT 0.8  PROT 6.1*  ALBUMIN 2.7*   No results for input(s): LIPASE, AMYLASE in the last 168 hours. No results for input(s): AMMONIA in the last 168 hours.  ABG No results found for: PHART, PCO2ART, PO2ART, HCO3, TCO2, ACIDBASEDEF, O2SAT   Coagulation Profile: Recent Labs  Lab 04/04/19 0228  INR 1.1    Cardiac Enzymes: No results for input(s): CKTOTAL, CKMB, CKMBINDEX, TROPONINI in the last 168 hours.  HbA1C: No results found for: HGBA1C  CBG: No results for input(s): GLUCAP in the last 168 hours.    Candee Furbish, DO [place] 04/05/2019 12:41 PM

## 2019-04-05 NOTE — Telephone Encounter (Signed)
-----   Message from Candee Furbish, MD sent at 04/05/2019 12:49 PM EST ----- Regarding: Add to my clinic either Mon or Tues Tele visit fine or in person, whichever she prefers.  Thanks, Erskine Emery

## 2019-04-05 NOTE — Telephone Encounter (Signed)
I attempted to reach this patient multiple times regarding the need for home O2. HIPAA compliant call back message left.  Whenever she calls back, please inform her that we are recommending home O2. Unfortunately, this was not set up prior to her d/c home. Advise her to return to the ED, if she develops worsening SOB. Otherwise, our staff will reach out tomorrow to set up home O2.

## 2019-04-05 NOTE — Progress Notes (Signed)
Patient O2 sat in room air resting 94-95% and O2 sat ambulating is 93-94%. Patient had been off oxygen since last night. No sign of respiratory distress noted.

## 2019-04-06 ENCOUNTER — Ambulatory Visit: Payer: Self-pay

## 2019-04-06 LAB — ACID FAST SMEAR (AFB, MYCOBACTERIA)
Acid Fast Smear: NEGATIVE
Acid Fast Smear: NEGATIVE
Acid Fast Smear: NEGATIVE

## 2019-04-06 NOTE — Chronic Care Management (AMB) (Signed)
Care Management   Initial Visit Note  04/06/2019 Name: Brenda [name] MRN: [id number] DOB: 1960-04-02  Subjective:   Objective:  Assessment: Brenda [name] is a 59 y.o. year old female who sees Lyndee Hensen, MD for primary care. The care management team was consulted for assistance with care management and care coordination needs related to Disease Management [facility] F/U.   Review of patient status, including review of consultants reports, relevant laboratory and other test results, and collaboration with appropriate care team members and the patient's provider was performed as part of comprehensive patient evaluation and provision of care management services.    SDOH (Social Determinants of Health) screening performed today: Housing . See Care Plan for related entries.    Outpatient Encounter Medications as of 04/06/2019  Medication Sig  . azithromycin (ZITHROMAX) 250 MG tablet Take 1 tablet (250 mg total) by mouth daily for 4 days.  . cefdinir (OMNICEF) 300 MG capsule Take 1 capsule (300 mg total) by mouth 2 (two) times daily for 3 days.  . famotidine (PEPCID) 20 MG tablet Take 1 tablet (20 mg total) by mouth 2 (two) times daily as needed for heartburn or indigestion. (Patient not taking: Reported on 04/03/2019)  . guaiFENesin (MUCINEX) 600 MG 12 hr tablet Take 1 tablet (600 mg total) by mouth 2 (two) times daily as needed for cough or to loosen phlegm.  Marland Kitchen oxyCODONE (OXY IR/ROXICODONE) 5 MG immediate release tablet Take 1 tablet (5 mg total) by mouth every 6 (six) hours as needed for up to 5 days for moderate pain.   No facility-administered encounter medications on file as of 04/06/2019.    Goals Addressed            This Visit's Progress   . I am having intermittent episodes of shortness of breath" (pt-stated)       Current Barriers:  . Financial Constraints related to need for oxygen and no insurance . Chronic Disease Management support  and education needs related to post discharge instructions, medications  and appointments . Knowledge Deficit related to post discharge instructions following event for IP event on 04/03/19-04/05/19  Nurse Case Manager Clinical Goal(s):  Marland Kitchen Over the next 14 days, patient will verbalize understanding of plan for post discharge care.  Interventions:  . Evaluation of current treatment plan related to post discharge care following IP event 04/03/19-04/05/19 and patient's adherence to plan as established by provider. . Patient evaluated states she still has some episodes of Shortness of breath but in no distress.  Patient knows that if she is in any distress to call 911 . Provided education to patient re: low sodium, washing can vegetables in water to remove excess salt . Reviewed medications with patient and discussed importance of antibiotics . Collaborated with [employer] regarding Home Oxygen.  [facility] Liaison,  Oxygen approved, patient notified 1257 am . Reviewed scheduled/upcoming provider appointments including: 04/07/19 with [name], [name], 04/10/19 Tele visit with [name] . Advised to call the office with any questions or concerns  Patient Self Care Activities:  . Self administers medications as prescribed . Attends all scheduled provider appointments . Calls pharmacy for medication refills . Calls provider office for new concerns or questions  Initial goal documentation         Follow up plan:  The care management team will reach out to the patient again over the next 14 days.  The patient has been provided with contact information for the care management team and  has been advised to call with any health related questions or concerns.   Brenda [name] was given information about Care Management services today including:  1. Care Management services include personalized support from designated clinical staff supervised by a physician, including individualized plan of care  and coordination with other care providers 2. 24/7 contact phone numbers for assistance for urgent and routine care needs. 3. The patient may stop Care Management services at any time (effective at the end of the month) by phone call to the office staff.  Patient agreed to services and verbal consent obtained. Lazaro Arms RN, BSN, [facility] Care Management Coordinator [place] Phone: [phone] Fax: [phone]

## 2019-04-06 NOTE — Patient Instructions (Signed)
Visit Information  Goals Addressed            This Visit's Progress   . I am having intermittent episodes of shortness of breath" (pt-stated)       Current Barriers:  . Financial Constraints related to need for oxygen and no insurance . Chronic Disease Management support and education needs related to post discharge instructions, medications  and appointments . Knowledge Deficit related to post discharge instructions following event for IP event on 04/03/19-04/05/19  Nurse Case Manager Clinical Goal(s):  Marland Kitchen Over the next 14 days, patient will verbalize understanding of plan for post discharge care.  Interventions:  . Evaluation of current treatment plan related to post discharge care following IP event 04/03/19-04/05/19 and patient's adherence to plan as established by provider. . Patient evaluated states she still has some episodes of Shortness of breath but in no distress.  Patient knows that if she is in any distress to call 911 . Provided education to patient re: low sodium, washing can vegetables in water to remove excess salt . Reviewed medications with patient and discussed importance of antibiotics . Collaborated with [employer] [facility] regarding Home Oxygen.  [facility] Liaison,  Oxygen approved, patient notified 1257 am . Reviewed scheduled/upcoming provider appointments including: 04/07/19 with Brenda, [name], 04/10/19 Tele visit with Brenda [name] . Advised to call the office with any questions or concerns  Patient Self Care Activities:  . Self administers medications as prescribed . Attends all scheduled provider appointments . Calls pharmacy for medication refills . Calls provider office for new concerns or questions  Initial goal documentation        Brenda [name] was given information about Care Management services today including:  1. Care Management services include personalized support from designated clinical staff supervised by her physician, including individualized  plan of care and coordination with other care providers 2. 24/7 contact phone numbers for assistance for urgent and routine care needs. 3. The patient may stop CCM services at any time (effective at the end of the month) by phone call to the office staff.  Patient agreed to services and verbal consent obtained.   The patient verbalized understanding of instructions provided today and declined a print copy of patient instruction materials.   The care management team will reach out to the patient again over the next 14 days.  The patient has been provided with contact information for the care management team and has been advised to call with any health related questions or concerns.    Lazaro Arms RN, BSN, [facility] Care Management Coordinator [place] Phone: [phone] Fax: [phone]

## 2019-04-06 NOTE — Telephone Encounter (Signed)
Spoke with the pt  She prefers televisit  Appt was scheduled for 04/10/2019

## 2019-04-06 NOTE — Telephone Encounter (Signed)
Multiple attempts made to follow-up with this patient today with no success. HIPAA compliant callback message left.

## 2019-04-06 NOTE — Telephone Encounter (Signed)
I was able to reach this patient. She is still having intermittent SOB especially with her neighbor smoking something around her. I advised her that she will benefit from home O2 and we are making an arrangement to get it send to her home. I also advised her to return to the ED if her breathing worsens and she does not have her home O2 yet. She agreed with the plan and verbalized understanding.

## 2019-04-07 ENCOUNTER — Inpatient Hospital Stay: Payer: Self-pay | Admitting: Family Medicine

## 2019-04-09 LAB — AEROBIC/ANAEROBIC CULTURE W GRAM STAIN (SURGICAL/DEEP WOUND)
Culture: NORMAL
Culture: NO GROWTH
Culture: NORMAL

## 2019-04-10 ENCOUNTER — Other Ambulatory Visit: Payer: Self-pay

## 2019-04-10 ENCOUNTER — Ambulatory Visit (INDEPENDENT_AMBULATORY_CARE_PROVIDER_SITE_OTHER): Payer: Self-pay | Admitting: Internal Medicine

## 2019-04-10 ENCOUNTER — Other Ambulatory Visit: Payer: Self-pay | Admitting: Family Medicine

## 2019-04-10 DIAGNOSIS — R911 Solitary pulmonary nodule: Secondary | ICD-10-CM

## 2019-04-10 NOTE — Patient Instructions (Signed)
PETCT

## 2019-04-10 NOTE — Addendum Note (Signed)
Addended by: [name] on: 04/10/2019 03:47 PM   Modules accepted: Orders

## 2019-04-10 NOTE — Progress Notes (Signed)
Visit done remotely due to [place] for patient and staff safety. Patient in agreement.  S: 59 year old smoker presenting after fall with musculoskeletal pain found to have a right sided large lung nodule in chest as well as infiltrates on left with question of pneumonia.  She underwent bronchoscopy which did not yield diagnosis for the lung nodule.  Micro for left side also unrevealing.  Currently doing well, rib pain improving, activity level improving.   Denies cough, hemoptysis.  O: Sound well on phone, speaking in full sentences.   Clinical History: Right Lung Mass  FINAL MICROSCOPIC DIAGNOSIS:  B. LUNG, RIGHT UPPER LOBE, BRUSHINGS:  - No malignant cells identified. C. LUNG, RIGHT UPPER LOBE, ASPIRATIONS:  - No malignant cells identified.  D. LUNG, RIGHT UPPER LOBE, LAVAGE:  - No malignant cells identified.  E. LUNG, RIGHT UPPER LOBE, LAVAGE #2:  - No malignant cells identified.   SPECIMEN ADEQUACY:  A. Satisfactory for Evaluation  B. Satisfactory for Evaluation  C. Satisfactory for Evaluation  D. Satisfactory for Evaluation   A:  # RUL lung nodule in smoker with negative bronch, in tough spot to get to even with navigation. # Abnormal HIV test with negative confirmatory testing  P: PET/CT to determine best percutaneous approach to biopsy I will call her with results of PET and refer to IR Further recs pending tissue diagnosis Needs another HIV RNA test in about 3 months to assure negative  Erskine Emery MD PCCM

## 2019-04-11 ENCOUNTER — Ambulatory Visit: Payer: Self-pay

## 2019-04-11 ENCOUNTER — Other Ambulatory Visit: Payer: Self-pay

## 2019-04-11 NOTE — Patient Instructions (Signed)
Visit Information  Goals Addressed            This Visit's Progress   . COMPLETED: I am having intermittent episodes of shortness of breath" (pt-stated)       Current Barriers:  . Financial Constraints related to need for oxygen and no insurance . Chronic Disease Management support and education needs related to post discharge instructions, medications  and appointments . Knowledge Deficit related to post discharge instructions following event for IP event on 04/03/19-04/05/19   Clinical Goal(s):  Over the next year, patient will: . Call the care management team with questions or concerns . Notify PCP if her hear declines and she needs additional support . Verbalize basic understanding of patient centered plans of care established   Interventions:  . Evaluation of current treatment plan related to post discharge care following IP event 04/03/19-04/05/19 and patient's adherence to plan as established by provider. . Patient evaluated states she still has some episodes of Shortness of breath but in no distress.  Patient knows that if she is in any distress to call 911 . Provided education to patient re: low sodium, washing can vegetables in water to remove excess salt . Reviewed medications with patient and discussed importance of antibiotics . Collaborated with [employer] regarding Home Oxygen.  [facility] Liaison,  Oxygen approved, patient notified 1257 am . Reviewed scheduled/upcoming provider appointments including: 04/07/19 with [name], [name], 04/10/19 Tele visit with [name] . Advised to call the office with any questions or concerns  04/11/19 . Patient states that she is doing better, she is able to talk in complete sentences . She has her oxygen . She is taking her medications . She is monitoring her diet . Patient has been attending her appointments . Patient know to call the office with any questions or concerns    Patient Self Care Activities:  . Self  administers medications as prescribed . Attends all scheduled provider appointments . Calls pharmacy for medication refills . Calls provider office for new concerns or questions  Please see past updates related to this goal by clicking on the "Past Updates" button in the selected goal         Brenda [name] was given information about Care Management services today including:  1. Care Management services include personalized support from designated clinical staff supervised by her physician, including individualized plan of care and coordination with other care providers 2. 24/7 contact phone numbers for assistance for urgent and routine care needs. 3. The patient may stop CCM services at any time (effective at the end of the month) by phone call to the office staff.  Patient agreed to services and verbal consent obtained.   The patient verbalized understanding of instructions provided today and declined a print copy of patient instruction materials.   The patient has been provided with contact information for the care management team and has been advised to call with any health related questions or concerns.  No further follow up required:  Lazaro Arms RN, BSN, [place] Phone: [phone] Fax: [phone]

## 2019-04-11 NOTE — Chronic Care Management (AMB) (Signed)
  Care Management   Follow Up Note   04/11/2019 Name: Brenda [name] MRN: [id number] DOB: 05-Dec-1960  Referred by: Lyndee Hensen, MD Reason for referral : Care Coordination (Care Management RNCM Mutifocal pneumonia)   Brenda [name] is a 59 y.o. year old female who is a primary care patient of Lyndee Hensen, MD. The care management team was consulted for assistance with care management and care coordination needs.    Review of patient status, including review of consultants reports, relevant laboratory and other test results, and collaboration with appropriate care team members and the patient's provider was performed as part of comprehensive patient evaluation and provision of chronic care management services.    SDOH (Social Determinants of Health) screening performed today: None. See Care Plan for related entries.   Advanced Directives: See Care Plan and [employer] application for related entries.   Goals Addressed            This Visit's Progress   . COMPLETED: I am having intermittent episodes of shortness of breath" (pt-stated)       Current Barriers:  . Financial Constraints related to need for oxygen and no insurance . Chronic Disease Management support and education needs related to post discharge instructions, medications  and appointments . Knowledge Deficit related to post discharge instructions following event for IP event on 04/03/19-04/05/19   Clinical Goal(s):  Over the next year, patient will: . Call the care management team with questions or concerns . Notify PCP if her hear declines and she needs additional support . Verbalize basic understanding of patient centered plans of care established   Interventions:  . Evaluation of current treatment plan related to post discharge care following IP event 04/03/19-04/05/19 and patient's adherence to plan as established by provider. . Patient evaluated states she still has some episodes of Shortness of breath but in no  distress.  Patient knows that if she is in any distress to call 911 . Provided education to patient re: low sodium, washing can vegetables in water to remove excess salt . Reviewed medications with patient and discussed importance of antibiotics . Collaborated with [facility] regarding Home Oxygen.  [facility] Liaison,  Oxygen approved, patient notified 1257 am . Reviewed scheduled/upcoming provider appointments including: 04/07/19 with [name], [name], 04/10/19 Tele visit with [name] . Advised to call the office with any questions or concerns  04/11/19 . Patient states that she is doing better, she is able to talk in complete sentences . She has her oxygen . She is taking her medications . She is monitoring her diet . Patient has been attending her appointments . Patient know to call the office with any questions or concerns    Patient Self Care Activities:  . Self administers medications as prescribed . Attends all scheduled provider appointments . Calls pharmacy for medication refills . Calls provider office for new concerns or questions  Please see past updates related to this goal by clicking on the "Past Updates" button in the selected goal          The patient has been provided with contact information for the care management team and has been advised to call with any health related questions or concerns.  No further follow up required:   Lazaro Arms RN, BSN, [place] Phone: [phone] Fax: [phone]

## 2019-04-18 ENCOUNTER — Encounter (HOSPITAL_COMMUNITY)
Admission: RE | Admit: 2019-04-18 | Discharge: 2019-04-18 | Disposition: A | Discharge status: Home / Self Care | Payer: Self-pay | Source: Ambulatory Visit | Attending: Internal Medicine | Admitting: Internal Medicine

## 2019-04-18 ENCOUNTER — Other Ambulatory Visit: Payer: Self-pay | Admitting: Internal Medicine

## 2019-04-18 ENCOUNTER — Other Ambulatory Visit: Payer: Self-pay

## 2019-04-18 DIAGNOSIS — R918 Other nonspecific abnormal finding of lung field: Secondary | ICD-10-CM

## 2019-04-18 DIAGNOSIS — R911 Solitary pulmonary nodule: Secondary | ICD-10-CM | POA: Insufficient documentation

## 2019-04-18 LAB — GLUCOSE, CAPILLARY: Glucose-Capillary: 92 mg/dL (ref 70–99)

## 2019-04-18 IMAGING — CT NM PET TUM IMG INITIAL (PI) SKULL BASE T - THIGH
1 of 7 series · 1 of 25 positions shown · non-contrast
Comparison: CTA chest dated [DATE]

CLINICAL DATA: Initial treatment strategy for right upper lobe
mass.

EXAM:
NUCLEAR MEDICINE PET SKULL BASE TO THIGH
TECHNIQUE: 6.2 mCi F-18 FDG was injected intravenously. Full-ring PET imaging
was performed from the skull base to thigh after the radiotracer. CT
data was obtained and used for attenuation correction and anatomic
localization.
Fasting blood glucose: 92 mg/dl

[Series 4: ct sk_thigh 5.0 b31f · axial · 5.0mm · 0.98mm/px · 1 of 201 slices shown]
[im 201/201  brain]
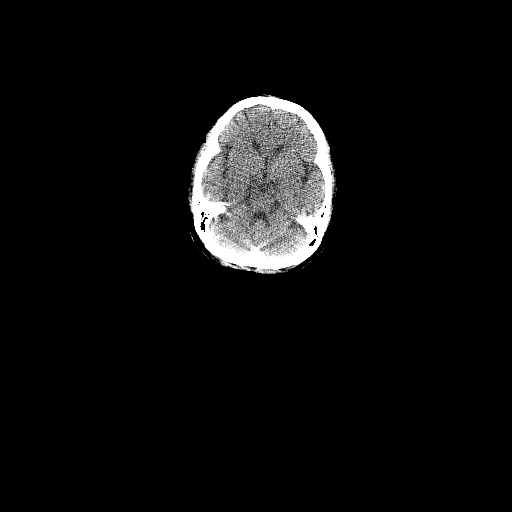

[1 of 25 positions shown; findings below may reference images not displayed]

FINDINGS: Mediastinal blood pool activity: SUV max

Liver activity: SUV max NA

NECK: No hypermetabolic cervical lymphadenopathy.

Incidental CT findings: none

CHEST: [DATE] x 2.9 cm medial right upper lobe nodule (series 8/image
119), max SUV 14.8, compatible with primary bronchogenic neoplasm.

13 mm nodule inferiorly in the right upper lobe adjacent to the
metastasis. Additional 10 mm irregular nodule in the posterior right
upper lobe adjacent to the minor fissure (series 8/image 29), max
SUV 1.9, suspicious for metastasis.

6 mm nodule in the anterior left upper lobe (series 8/image 26), non
FDG avid but beneath the size threshold for PET sensitivity.

No hypermetabolic thoracic lymphadenopathy.

Incidental CT findings: Mild centrilobular and paraseptal
emphysematous changes, upper lung predominant. Linear
scarring/atelectasis in the bilateral lower lobes, left greater than
right. Mild coronary atherosclerosis of the LAD and right coronary
artery.

ABDOMEN/PELVIS: No hypermetabolic lymphadenopathy in the
abdomen/pelvis.

No abnormal hypermetabolism in the liver, spleen, pancreas, or
adrenal glands.

Incidental CT findings: Layering gallstones, without associated
inflammatory changes. Atherosclerotic calcifications the abdominal
aorta and branch vessels. Moderate fat containing supraumbilical
ventral hernia (series 4/image 115). Small fat containing
periumbilical hernia (series 4/image 126).

SKELETON: Multiple right posterior rib fractures, involving the
right posterior 5th through 11th ribs. Multiple medial right rib
fractures at the costovertebral junction, with associated right
transverse process fractures, at T6-9. Representative max SUV
along the right posterior 5th rib.

No focal hypermetabolic activity to suggest skeletal metastases.

Incidental CT findings: none
IMPRESSION: 2.9 cm nodule in the medial right upper lobe, suspicious for primary
bronchogenic neoplasm.

Two satellite nodule inferiorly in the right upper lobe, suspicious
for metastases.

6 mm nodule in the anterior left upper lobe, indeterminate.

Multiple right posterior chest wall fractures, likely related to
recent trauma.

## 2019-04-18 MED ORDER — OXYCODONE HCL 5 MG PO TABS: 5.0000 mg | ORAL_TABLET | ORAL | 0 refills | Status: DC | PRN

## 2019-04-18 MED ORDER — FLUDEOXYGLUCOSE F - 18 (FDG) INJECTION
6.2000 | Freq: Once | INTRAVENOUS | Status: AC
  Administered 2019-04-18: 6.2 via INTRAVENOUS

## 2019-04-18 NOTE — Telephone Encounter (Signed)
PET scan reviewed and discussed with patient. Will send to cardiothoracic surgery to see if they are willing to cut out the right upper lobe as this contains all the PET-avid lesions.  If not, IR will need to biopsy which will be high risk.  Also incidental note of posterior rib fractures from her recent fall, she continues to have debilitating pain and pleurisy despite motrin and tylenol.  Discussed case with Dr. Valeta Harms and we are willing to give short course of percocet.  Unfortunately I still do not have e-prescribing privileges for controlled substances and this cannot be phoned to pharmacy.  Patient will come by office at noon tomorrow.  I am asking our triage pool to get Dr. Valeta Harms to sign a script for her:  Percocet 5/325mg  Take q6h PRN severe pain 30 pills, 0 refills

## 2019-04-19 MED ORDER — OXYCODONE-ACETAMINOPHEN 5-325 MG PO TABS: 1.0000 | ORAL_TABLET | Freq: Four times a day (QID) | ORAL | 0 refills | Status: DC | PRN

## 2019-04-19 NOTE — Telephone Encounter (Addendum)
Dr Valeta Harms, the rx would not print- can you try sending with thumb print? Thanks I verified her pharm and she is aware we are going to try and send electronically

## 2019-04-19 NOTE — Addendum Note (Signed)
Addended by: Garner Nash on: 04/19/2019 11:29 AM   Modules accepted: Orders

## 2019-04-19 NOTE — Telephone Encounter (Signed)
PCCM:  rx signed for Dr. Luisa Dago, DO [facility] 04/19/2019 11:28 AM

## 2019-04-19 NOTE — Addendum Note (Signed)
Addended by: Rosana Berger on: 04/19/2019 11:21 AM   Modules accepted: Orders

## 2019-04-27 ENCOUNTER — Telehealth: Payer: Self-pay | Admitting: Pulmonary Disease

## 2019-04-27 ENCOUNTER — Other Ambulatory Visit: Payer: Self-pay | Admitting: *Deleted

## 2019-04-27 DIAGNOSIS — R918 Other nonspecific abnormal finding of lung field: Secondary | ICD-10-CM

## 2019-04-27 NOTE — Telephone Encounter (Signed)
PCCM:  Case discussed this morning in tumor conference.  Referral placed for percutaneous needle biopsy.  Routed to [facility] pool.  Please schedule.  Cc; Norton Blizzard, thoracic oncology coordinator  Garner Nash, DO [facility] 04/27/2019 4:45 PM

## 2019-04-27 NOTE — Progress Notes (Signed)
The proposed treatment discussed purpose only and is not a binding recommendation.  The patient was not physically examined nor present for their treatment options.  Therefore, final treatment plans cannot be decided.

## 2019-04-28 ENCOUNTER — Encounter (HOSPITAL_COMMUNITY): Payer: Self-pay | Admitting: Radiology

## 2019-04-28 NOTE — Progress Notes (Signed)
Brenda [name] Female, 59 y.o., Nov 11, 1960 MRN:  [id number] Phone:  [phone] Jerilynn Mages) PCP:  Lyndee Hensen, MD Coverage:  Medicaid Potential/Medicaid Potential Next Appt With Radiology ([facility]-CT 3) 05/15/2019 at 11:00 AM  RE: CT Biopsy Received: Yesterday Message Contents  Markus Daft, MD  Arlyn Leak for CT guided biopsy of right upper lobe pleural based lesion.   [place]       Previous Messages   ----- Message -----  From: Garth Bigness D  Sent: 04/27/2019  4:50 PM EST  To: [facility] Procedure Requests  Subject: CT Biopsy                     Procedure:  CT Biopsy   Reason:  right sided posterior pet avid lung mass, lung mass   History: CT, NM PET in computer   Provider: Garner Nash   Provider Contact: [phone]

## 2019-05-02 NOTE — Telephone Encounter (Signed)
This has already been scheduled by Central Sched[facility] for 2/22.  They called the pt & scheduled it.  Nothing further needed.

## 2019-05-02 NOTE — Telephone Encounter (Signed)
PCC's please advise on this. Thanks.

## 2019-05-04 LAB — FUNGAL ORGANISM REFLEX

## 2019-05-04 LAB — FUNGUS CULTURE WITH STAIN

## 2019-05-04 LAB — FUNGUS CULTURE RESULT

## 2019-05-05 ENCOUNTER — Other Ambulatory Visit: Payer: Self-pay | Admitting: *Deleted

## 2019-05-05 ENCOUNTER — Inpatient Hospital Stay (HOSPITAL_COMMUNITY): Admission: RE | Admit: 2019-05-05 | Payer: Self-pay | Source: Ambulatory Visit

## 2019-05-05 DIAGNOSIS — R911 Solitary pulmonary nodule: Secondary | ICD-10-CM

## 2019-05-08 ENCOUNTER — Other Ambulatory Visit: Payer: Self-pay

## 2019-05-08 ENCOUNTER — Ambulatory Visit (INDEPENDENT_AMBULATORY_CARE_PROVIDER_SITE_OTHER): Payer: Self-pay | Admitting: Internal Medicine

## 2019-05-08 DIAGNOSIS — R911 Solitary pulmonary nodule: Secondary | ICD-10-CM

## 2019-05-08 LAB — PULMONARY FUNCTION TEST
FEF 25-75 Pre: 1.04 L/sec
FEF2575-%Pred-Pre: 43 %
FEV1-%Pred-Pre: 70 %
FEV1-Pre: 1.68 L
FEV1FVC-%Pred-Pre: 83 %
FEV6-%Pred-Pre: 84 %
FEV6-Pre: 2.45 L
FEV6FVC-%Pred-Pre: 102 %
FVC-%Pred-Pre: 84 %
FVC-Pre: 2.51 L
Pre FEV1/FVC ratio: 67 %
Pre FEV6/FVC Ratio: 100 %

## 2019-05-08 NOTE — Progress Notes (Signed)
Patient did not have covid screening performed. Tech only performed spirometry.

## 2019-05-09 ENCOUNTER — Encounter: Payer: Self-pay | Admitting: Cardiothoracic Surgery

## 2019-05-09 ENCOUNTER — Institutional Professional Consult (permissible substitution) (INDEPENDENT_AMBULATORY_CARE_PROVIDER_SITE_OTHER): Payer: Self-pay | Admitting: Cardiothoracic Surgery

## 2019-05-09 ENCOUNTER — Other Ambulatory Visit: Payer: Self-pay | Admitting: Cardiothoracic Surgery

## 2019-05-09 VITALS — BP 139/83 | HR 193 | Temp 97.7°F | Resp 16 | Ht 66.0 in | Wt 114.0 lb

## 2019-05-09 DIAGNOSIS — D381 Neoplasm of uncertain behavior of trachea, bronchus and lung: Secondary | ICD-10-CM

## 2019-05-09 NOTE — Progress Notes (Signed)
[address]       [place] [address]             [phone]                    Derin Stolar [facility] Medical Record #[id number] Date of Birth: May 06, 1960  Referring: Candee Furbish, MD Primary Care: Lyndee Hensen, MD Primary Cardiologist: No primary care provider on file.  Chief Complaint:    Chief Complaint  Patient presents with  . Lung Mass    RULobe...CT CHEST 04/03/19, PET 04/18/19, PFT 05/05/19, BRONCH/EBUS/ENB 04/04/19.Marland KitchenMarland KitchenNEG  . Lung Lesion    RUlobe/LULobe    History of Present Illness:    Brenda [name] 59 y.o. female is seen in the office  today for evaluation of lung lesions.  The patient was referred by pulmonary service.  So far pulmonary function studies are incomplete-diffusion capacity was not done because of lack of Covid test.  Had a needle directed CT biopsy of posterior left upper lobe mass involving the pleura and/or chest wall on February 22.  The patient is a daily smoker and although has decreased some continues to smoke .  She has underlying lung disease both by CT and functionally, she notes she is able to walk around her house without significant shortness of breath but is unable to climb a flight of stairs without difficulty.  On January 11 the patient notes that she fell striking her right side on the arm of her sofa, following this she had increasing right pleuritic rib pain and noted "ribs moving ".  She was admitted to the hospital with acute respiratory failure requiring nonrebreather oxygen CT scan of the chest was done that demonstrated 3 cm pleural-based right upper lobe posterior mass in addition to the 2 smaller lesions in the right upper lobe, all 3 hypermetabolic.  In addition she had a left lower lobe infiltrative mass and was treated for pneumonia with IV antibiotics  Bronchoscopy was performed on January 12 with Dr. Alessandra Bevels definitive diagnosis was made from biopsies of the right upper lobe.  Current Activity/  Functional Status:  Patient is independent with mobility/ambulation, transfers, ADL's, IADL's.   Zubrod Score: At the time of surgery this patient's most appropriate activity status/level should be described as: []     0    Normal activity, no symptoms [x]     1    Restricted in physical strenuous activity but ambulatory, able to do out light work []     2    Ambulatory and capable of self care, unable to do work activities, up and about               >50 % of waking hours                              []     3    Only limited self care, in bed greater than 50% of waking hours []     4    Completely disabled, no self care, confined to bed or chair []     5    Moribund   Past Medical History:  Diagnosis Date  . Depression   . Mental disorder     Past Surgical History:  Procedure Laterality Date  . TUBAL LIGATION    . VIDEO BRONCHOSCOPY WITH ENDOBRONCHIAL NAVIGATION N/A 04/04/2019   Procedure: VIDEO BRONCHOSCOPY WITH ENDOBRONCHIAL NAVIGATION;  Surgeon: Valeta Harms,  Octavio Graves, DO;  Location: [facility] OR;  Service: Thoracic;  Laterality: N/A;  . VIDEO BRONCHOSCOPY WITH ENDOBRONCHIAL ULTRASOUND N/A 04/04/2019   Procedure: VIDEO BRONCHOSCOPY WITH ENDOBRONCHIAL ULTRASOUND;  Surgeon: Garner Nash, DO;  Location: [facility] OR;  Service: Thoracic;  Laterality: N/A;    Family history is significant for her mother.  Died with colon cancer, 1 brother is alive history of unknown malignancy treated with radiation chemo  Social History   Tobacco Use  Smoking Status Current Every Day Smoker  . Packs/day: 0.50  . Years: 30.00  . Pack years: 15.00  . Types: Cigarettes  Smokeless Tobacco Never Used    Social History   Substance and Sexual Activity  Alcohol Use Yes     Allergies  Allergen Reactions  . Aspirin Adult Low [Aspirin]     Stomach upset    Current Outpatient Medications  Medication Sig Dispense Refill  . guaiFENesin (MUCINEX) 600 MG 12 hr tablet Take 1 tablet (600 mg total) by mouth 2 (two)  times daily as needed for cough or to loosen phlegm. (Patient not taking: Reported on 05/09/2019)    . oxyCODONE-acetaminophen (PERCOCET/ROXICET) 5-325 MG tablet Take 1 tablet by mouth every 6 (six) hours as needed for severe pain. (Patient not taking: Reported on 05/09/2019) 30 tablet 0   No current facility-administered medications for this visit.      Review of Systems:     Cardiac Review of Systems: [Y] = yes  or   [ N ] = no   Chest Pain [  n  ]  Resting SOB [n   ] Exertional SOB  [ y ]  Vertell Limber Florencio.Farrier  ]   Pedal Edema Florencio.Farrier ]    Palpitations [ n ] Syncope  [ n ]   Presyncope [n   ]   General Review of Systems: [Y] = yes [  ]=no Constitional: recent weight change [  ];  Wt loss over the last 3 months [   ] anorexia [  ]; fatigue [  ]; nausea [  ]; night sweats [  ]; fever [  ]; or chills [  ];           Eye : blurred vision [  ]; diplopia [   ]; vision changes [  ];  Amaurosis fugax[  ]; Resp: cough [  ];  wheezing[  ];  hemoptysis[  ]; shortness of breath[  ]; paroxysmal nocturnal dyspnea[  ]; dyspnea on exertion[  ]; or orthopnea[  ];  GI:  gallstones[  ], vomiting[  ];  dysphagia[  ]; melena[  ];  hematochezia [  ]; heartburn[  ];   Hx of  Colonoscopy[  ]; GU: kidney stones [  ]; hematuria[  ];   dysuria [  ];  nocturia[  ];  history of     obstruction [  ]; urinary frequency [  ]             Skin: rash, swelling[  ];, hair loss[  ];  peripheral edema[  ];  or itching[  ]; Musculosketetal: myalgias[  ];  joint swelling[  ];  joint erythema[  ];  joint pain[  ];  back pain[  ];  Heme/Lymph: bruising[  ];  bleeding[  ];  anemia[  ];  Neuro: TIA[  ];  headaches[  ];  stroke[  ];  vertigo[  ];  seizures[  ];   paresthesias[  ];  difficulty walking[  ];  Psych:depression[  ]; anxiety[  ];  Endocrine: diabetes[  ];  thyroid dysfunction[  ];  Immunizations: Flu up to date [  ]; Pneumococcal up to date [  ];  Other:     PHYSICAL EXAMINATION: BP 139/83 (BP Location: Left Arm, Patient  Position: Sitting, Cuff Size: Normal)   Pulse (!) 193   Temp 97.7 F (36.5 C)   Resp 16   Ht 5\' 6"  (1.676 m)   Wt 114 lb (51.7 kg)   LMP 01/19/2011   SpO2 98% Comment: RA  BMI 18.40 kg/m  General appearance: alert, cooperative, appears older than stated age and no distress Head: Normocephalic, without obvious abnormality, atraumatic Neck: no adenopathy, no carotid bruit, no JVD, supple, symmetrical, trachea midline and thyroid not enlarged, symmetric, no tenderness/mass/nodules Lymph nodes: Cervical, supraclavicular, and axillary nodes normal. Resp: clear to auscultation bilaterally Cardio: regular rate and rhythm, S1, S2 normal, no murmur, click, rub or gallop GI: soft, non-tender; bowel sounds normal; no masses,  no organomegaly Extremities: extremities normal, atraumatic, no cyanosis or edema, Homans sign is negative, no sign of DVT and Patient has significant clubbing of her fingers Neurologic: Grossly normal  Diagnostic Studies & Laboratory data:     Recent Radiology Findings:  CLINICAL DATA:  Chest injury after fall yesterday.  EXAM: CT ANGIOGRAPHY CHEST WITH CONTRAST  TECHNIQUE: Multidetector CT imaging of the chest was performed using the standard protocol during bolus administration of intravenous contrast. Multiplanar CT image reconstructions and MIPs were obtained to evaluate the vascular anatomy.  CONTRAST:  64mL OMNIPAQUE IOHEXOL 350 MG/ML SOLN  COMPARISON:  Radiograph of same day.  FINDINGS: Cardiovascular: Preferential opacification of the thoracic aorta. No evidence of thoracic aortic aneurysm or dissection. Normal heart size. No pericardial effusion.  Mediastinum/Nodes: No enlarged mediastinal, hilar, or axillary lymph nodes. Thyroid gland, trachea, and esophagus demonstrate no significant findings.  Lungs/Pleura: No pneumothorax is noted. Left upper and lower lobe opacities are noted concerning for atelectasis or pneumonia. Minimal right  posterior basilar subsegmental atelectasis is noted. 3.1 x 1.9 cm pleural based mass is noted medially in the right upper lobe highly concerning for malignancy. This is best seen on image number 46 of series 5. 14 x 12 mm nodule is noted in right upper lobe concerning for malignancy. Mild emphysematous disease is noted in both upper lobes.  Upper Abdomen: No acute abnormality.  Musculoskeletal: No chest wall abnormality. No acute or significant osseous findings.  Review of the MIP images confirms the above findings.  IMPRESSION: No evidence of thoracic aortic dissection or aneurysm.  3.1 x 1.9 cm pleural based mass is noted medially in the right upper lobe highly concerning for malignancy. Also noted is 1.4 x 1.2 cm nodule in the right upper lobe concerning for malignancy or metastatic disease. PET scan is recommended for further evaluation.  Left upper and lower lobe airspace opacities are noted concerning for pneumonia or atelectasis.  Emphysema (ICD10-J43.9).   Electronically Signed   By: Marijo Conception M.D.   On: 04/03/2019 11:36   NM PET Image Initial (PI) Skull Base To Thigh  Result Date: 04/18/2019 CLINICAL DATA:  Initial treatment strategy for right upper lobe mass. EXAM: NUCLEAR MEDICINE PET SKULL BASE TO THIGH TECHNIQUE: 6.2 mCi F-18 FDG was injected intravenously. Full-ring PET imaging was performed from the skull base to thigh after the radiotracer. CT data was obtained and used for attenuation correction and anatomic localization. Fasting blood glucose: 92 mg/dl COMPARISON:  CTA chest dated 04/03/2019 FINDINGS:  Mediastinal blood pool activity: SUV max 2.1 Liver activity: SUV max NA NECK: No hypermetabolic cervical lymphadenopathy. Incidental CT findings: none CHEST: 2.3 x 2.9 cm medial right upper lobe nodule (series 8/image 119), max SUV 14.8, compatible with primary bronchogenic neoplasm. 13 mm nodule inferiorly in the right upper lobe adjacent to the minor  fissure (series 8/image 30), max SUV 9.1, suspicious for metastasis. Additional 10 mm irregular nodule in the posterior right upper lobe adjacent to the minor fissure (series 8/image 29), max SUV 1.9, suspicious for metastasis. 6 mm nodule in the anterior left upper lobe (series 8/image 26), non FDG avid but beneath the size threshold for PET sensitivity. No hypermetabolic thoracic lymphadenopathy. Incidental CT findings: Mild centrilobular and paraseptal emphysematous changes, upper lung predominant. Linear scarring/atelectasis in the bilateral lower lobes, left greater than right. Mild coronary atherosclerosis of the LAD and right coronary artery. ABDOMEN/PELVIS: No hypermetabolic lymphadenopathy in the abdomen/pelvis. No abnormal hypermetabolism in the liver, spleen, pancreas, or adrenal glands. Incidental CT findings: Layering gallstones, without associated inflammatory changes. Atherosclerotic calcifications the abdominal aorta and branch vessels. Moderate fat containing supraumbilical ventral hernia (series 4/image 115). Small fat containing periumbilical hernia (series 4/image 126). SKELETON: Multiple right posterior rib fractures, involving the right posterior 5th through 11th ribs. Multiple medial right rib fractures at the costovertebral junction, with associated right transverse process fractures, at T6-9. Representative max SUV 5.8 along the right posterior 5th rib. No focal hypermetabolic activity to suggest skeletal metastases. Incidental CT findings: none IMPRESSION: 2.9 cm nodule in the medial right upper lobe, suspicious for primary bronchogenic neoplasm. Two satellite nodule inferiorly in the right upper lobe, suspicious for metastases. 6 mm nodule in the anterior left upper lobe, indeterminate. Multiple right posterior chest wall fractures, likely related to recent trauma. Electronically Signed   By: Julian Hy M.D.   On: 04/18/2019 15:04     I have independently reviewed the above  radiology studies  and reviewed the findings with the patient.   Recent Lab Findings: Lab Results  Component Value Date   WBC 11.5 (H) 04/04/2019   HGB 11.8 (L) 04/04/2019   HCT 35.3 (L) 04/04/2019   PLT 201 04/04/2019   GLUCOSE 111 (H) 04/04/2019   ALT 11 04/04/2019   AST 13 (L) 04/04/2019   NA 134 (L) 04/04/2019   K 3.4 (L) 04/04/2019   CL 99 04/04/2019   CREATININE 0.63 04/04/2019   BUN 10 04/04/2019   CO2 26 04/04/2019   INR 1.1 04/04/2019      Assessment / Plan:   #1 patient with 3 cm right upper lobe mass highly suspicious for malignancy likely with at least pleural involvement of the posterior chest wall and 2 satellite lesions in the right upper lobe that are suggestive of malignancy-bronchoscopy done by the pulmonary service was unrevealing as far as diagnosis.-Pulmonary service as ordered CT-guided needle biopsy for February 22. 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 ( T3,N0,M)     We will plan to see the patient back quickly after her needle biopsy and completion of her functional status for making decision on treatment strategy.   Grace Isaac  MD      [address] [place] [address] Office [phone]     05/09/2019 4:31 PM

## 2019-05-09 NOTE — Patient Instructions (Signed)
Pulmonary Nodule A pulmonary nodule is a small, round growth of tissue in the lung. It is sometimes referred to as a "shadow" or "spot on the lung." Nodules range in size from less than 1/5 of an inch (4 mm) to a little bigger than an inch (30 mm). Pulmonary nodules can be either noncancerous (benign) or cancerous (malignant). Most are noncancerous. Smaller nodules in people who do not smoke and do not have any other risk factors for lung cancer are more likely to be noncancerous. Larger, irregular nodules in people who smoke or who have a strong family history of lung cancer are more likely to be cancerous. What are the causes? This condition may be caused by:  A bacterial, fungal, or viral infection, such as tuberculosis. The infection is usually an old and inactive one.  A noncancerous mass of tissue.  Inflammation from conditions such as rheumatoid arthritis.  Abnormal blood vessels in the lungs.  Cancerous tissue, such as lung cancer or a cancer in another part of the body that has spread to the lung. What are the signs or symptoms? This condition usually does not cause symptoms. If symptoms appear, they are usually related to the underlying cause. For example, if the condition is caused by an infection, you may have a cough or fever. How is this diagnosed? This condition is usually diagnosed with an X-ray or CT scan. To help determine whether a pulmonary nodule is benign or malignant, your health care provider will:  Take your medical history.  Perform a physical exam.  Order tests, including: ? Blood tests. ? A skin test called a tuberculin test. This test is done to check if you have been exposed to the germ that causes tuberculosis. ? Chest X-rays. ? A CT scan. This test shows smaller pulmonary nodules more clearly and with more detail than an X-ray. ? A positron emission tomography (PET) scan. This test is done to check if the nodule is cancerous. During the test, a safe amount  of a radioactive substance is injected into the bloodstream. Then a picture is taken. ? Biopsy. In this test, a tiny piece of the pulmonary nodule is removed and then examined under a microscope. How is this treated? Treatment for this condition depends on whether the pulmonary nodule is malignant or benign as well as your risk of getting cancer.  Noncancerous nodules usually do not need to be treated, but they may need to be monitored with CT scans. If a CT scan shows that the pulmonary nodule got bigger, more tests may be done.  Some nodules need to be removed. If this is the case, you may have a procedure called a thoractomy. During the procedure, your health care provider will make an incision in your chest and remove the part of the lung where the nodule is located. Follow these instructions at home:   Take over-the-counter and prescription medicines only as told by your health care provider.  Do not use any products that contain nicotine or tobacco, such as cigarettes and e-cigarettes. If you need help quitting, ask your health care provider.  Keep all follow-up visits as told by your health care provider. This is important. Contact a health care provider if:  You have trouble breathing when you are active.  You feel sick or unusually tired.  You do not feel like eating.  You lose weight without trying.  You develop chills or night sweats. Get help right away if:  You cannot catch your breath.  You begin wheezing.  You cannot stop coughing.  You cough up blood.  You become dizzy or feel like you are going to faint.  You have sudden chest pain.  You have a fever or persistent symptoms for more than 2-3 days.  You have a fever and your symptoms suddenly get worse. Summary  A pulmonary nodule is a small, round growth of tissue in the lung. Most pulmonary nodules are noncancerous.  This condition is usually diagnosed with an X-ray or CT scan.  Common causes of  pulmonary nodules include infection, inflammation, and noncancerous growths.  Though less common, if a nodule is found to be cancerous, you will need specific diagnostic tests and treatment options as directed by your medical provider.  Treatment for this condition depends on whether the pulmonary nodule is benign or malignant as well as your risk of getting cancer. This information is not intended to replace advice given to you by your health care provider. Make sure you discuss any questions you have with your health care provider. Document Revised: 04/02/2017 Document Reviewed: 04/07/2016 Elsevier Patient Education  2020 [place] Lung cancer is an abnormal growth of cancerous cells that forms a mass (malignant tumor) in a lung. There are several types of lung cancer. The types are based on the appearance of the tumor cells. The two most common types are:  Non-small cell lung cancer. This type of lung cancer is the most common type. Non-small cell lung cancers include squamous cell carcinoma, adenocarcinoma, and large cell carcinoma.  Small cell lung cancer. In this type of lung cancer, abnormal cells are smaller than those of non-small cell lung cancer. Small cell lung cancer gets worse (progresses) faster than non-small cell lung cancer. What are the causes? The most common cause of lung cancer is smoking tobacco. The second most common cause is exposure to a chemical called radon. What increases the risk? You are more likely to develop this condition if:  You smoke tobacco.  You have been exposed to: ? Secondhand tobacco smoke. ? Radon gas. ? Uranium. ? Asbestos. ? Arsenic in drinking water. ? Air pollution.  You have a family or personal history of lung cancer.  You have had lung radiation therapy in the past.  You are older than age 35. What are the signs or symptoms? In the early stages, you may not have any symptoms. As the cancer progresses, symptoms  may include:  A lasting cough, possibly with blood.  Fatigue.  Unexplained weight loss.  Shortness of breath.  Loud breathing (wheezing).  Chest pain.  Loss of appetite. Symptoms of advanced lung cancer include:  Hoarseness.  Bone or joint pain.  Weakness.  Change in the structure of the fingernails (clubbing), so that the nail looks like an upside-down spoon.  Swelling of the face or arms.  Inability to move the face (paralysis).  Drooping eyelids. How is this diagnosed? This condition may be diagnosed based on:  Your symptoms and medical history.  A physical exam.  A chest X-ray.  A CT scan.  Blood tests.  Sputum tests.  Removal of a sample of lung tissue (lung biopsy) for testing. Your cancer will be assessed (staged) to determine how severe it is and how much it has spread (metastasized). How is this treated? Treatment depends on the type and stage of your cancer. Treatment may include one or more of the following:  Surgery to remove as much of the cancer as possible. Lymph nodes  in the area may be removed and tested for cancer as well.  Medicines that kill cancer cells (chemotherapy).  High-energy rays that kill cancer cells (radiation therapy).  Chemotherapy. This treatment uses medicines to destroy cancer cells.  Targeted therapy. This targets specific parts of cancer cells and the area around them to block the growth and spread of the cancer. Targeted therapy can help limit the damage to healthy cells. Follow these instructions at home: Eating and drinking  Some of your treatments might affect your appetite. If you are having problems eating, or if you do not have an appetite, meet with a dietitian.  If you have side effects that affect your appetite, it may help to: ? Eat smaller meals and snacks often. ? Drink high-nutrition and high-calorie shakes or supplements. ? Eat bland and soft foods that are easy to eat. ? Avoid eating foods that  are hot, spicy, or hard to swallow. General instructions   Do not use any products that contain nicotine or tobacco, such as cigarettes and e-cigarettes. If you need help quitting, ask your health care provider.  Do not drink alcohol.  If you are admitted to the hospital, make sure your cancer specialist (oncologist) is aware. Your cancer may affect your treatment for other conditions.  Take over-the-counter and prescription medicines only as told by your health care provider.  Consider joining a support group for people who have been diagnosed with lung cancer.  Work with your health care provider to manage any side effects of treatment.  Keep all follow-up visits as told by your health care provider. This is important. Where to find more information  American Cancer Society: https://www.cancer.[place] ([place]): https://www.cancer.gov Contact a health care provider if you:  Lose weight without trying.  Have a persistent cough and wheezing.  Feel short of breath.  Get tired easily.  Have bone or joint pain.  Have difficulty swallowing.  Notice that your voice is changing or getting hoarse.  Have pain that does not get better with medicine. Get help right away if you:  Cough up blood.  Have new breathing problems.  Have chest pain.  Have a fever.  Have swelling in an ankle, leg, or arm, or the face or neck.  Have paralysis in your face.  Are very confused.  Have a drooping eyelid. Summary  Lung cancer is an abnormal growth of cancerous cells that forms a mass (malignant tumor) in a lung.  There are several types of lung cancer. The types are based on the appearance of the tumor cells. The two most common types are non-small cell and small cell.  The most common cause of lung cancer is smoking tobacco.  Early symptoms include a lasting cough, possibly with blood, and fatigue, unexplained weight loss, and shortness of breath.  After  diagnosis, treatment depends on the type and stage of your cancer. This information is not intended to replace advice given to you by your health care provider. Make sure you discuss any questions you have with your health care provider. Document Revised: 02/19/2017 Document Reviewed: 01/14/2017 Elsevier Patient Education  2020 [employer].

## 2019-05-11 ENCOUNTER — Other Ambulatory Visit: Payer: Self-pay | Admitting: Radiology

## 2019-05-12 ENCOUNTER — Other Ambulatory Visit (HOSPITAL_COMMUNITY)
Admission: RE | Admit: 2019-05-12 | Discharge: 2019-05-12 | Disposition: A | Discharge status: Home / Self Care | Payer: Self-pay | Source: Ambulatory Visit | Attending: Pulmonary Disease | Admitting: Pulmonary Disease

## 2019-05-12 DIAGNOSIS — Z20822 Contact with and (suspected) exposure to covid-19: Secondary | ICD-10-CM | POA: Insufficient documentation

## 2019-05-12 DIAGNOSIS — Z01812 Encounter for preprocedural laboratory examination: Secondary | ICD-10-CM | POA: Insufficient documentation

## 2019-05-12 LAB — SARS CORONAVIRUS 2 (TAT 6-24 HRS): SARS Coronavirus 2: NEGATIVE

## 2019-05-15 ENCOUNTER — Inpatient Hospital Stay (HOSPITAL_COMMUNITY): Admission: RE | Admit: 2019-05-15 | Payer: Self-pay | Source: Ambulatory Visit

## 2019-05-15 ENCOUNTER — Ambulatory Visit (HOSPITAL_COMMUNITY): Admission: RE | Admit: 2019-05-15 | Payer: Self-pay | Source: Ambulatory Visit

## 2019-05-18 LAB — ACID FAST CULTURE WITH REFLEXED SENSITIVITIES (MYCOBACTERIA)
Acid Fast Culture: NEGATIVE
Acid Fast Culture: NEGATIVE
Acid Fast Culture: NEGATIVE

## 2019-05-24 ENCOUNTER — Encounter: Payer: Self-pay | Admitting: *Deleted

## 2019-05-24 NOTE — Progress Notes (Signed)
Oncology Nurse Navigator Documentation  Oncology Nurse Navigator Flowsheets 05/24/2019  Navigator Location [facility]  Navigator Encounter Type Telephone;Other/I followed up on Ms. Mcnutt's schedule.  She missed her bx and PFT.  I contacted Dr. Servando Snare and Dr. Valeta Harms to get an update.  Dr. Servando Snare would like me to call patient with an updated on her wishes for plan of care. I called and spoke with patient. She would like to be set up for bx and then follow up with Dr. Servando Snare which I reminded her of that appt.  Dr. Servando Snare is update and would like her to get bx and pft.  I notified central scheduling and they tried to call her. They were unable to reach and asked if I would call patient and have her call them.  I called patient but was unable to reach her.  I did leave vm message with my name and phone number to call.   Telephone Outgoing Call  Treatment Phase Abnormal Scans  Barriers/Navigation Needs Coordination of Care;Education  Education Other  Interventions Coordination of Care;Education  Acuity Level 3-Moderate Needs (3-4 Barriers Identified)  Coordination of Care Other  Education Method Verbal  Time Spent with Patient 30

## 2019-05-31 ENCOUNTER — Telehealth: Payer: Self-pay | Admitting: *Deleted

## 2019-05-31 NOTE — Telephone Encounter (Signed)
Oncology Nurse Navigator Documentation  Oncology Nurse Navigator Flowsheets 05/31/2019  Navigator Location [facility]  Navigator Encounter Type Telephone/I called Ms. Canada today.  I spoke to her about her plan of care. Patient needs bx and central scheduling has been calling her without being able to reach her.  I gave her the phone number to call and schedule.   Telephone Outgoing Call  Treatment Phase Abnormal Scans  Barriers/Navigation Needs Education  Education Other  Interventions Education  Acuity Level 2-Minimal Needs (1-2 Barriers Identified)  Coordination of Care -  Education Method Verbal  Time Spent with Patient 15

## 2019-06-01 ENCOUNTER — Telehealth: Payer: Self-pay | Admitting: *Deleted

## 2019-06-01 ENCOUNTER — Ambulatory Visit: Payer: Self-pay | Admitting: Cardiothoracic Surgery

## 2019-06-01 NOTE — Telephone Encounter (Signed)
I called resp therapy and scheduled PFT's. I called patient to update on appt time, place, and pre-procedure instructions. I will update Dr. Everrett Coombe office.

## 2019-06-05 ENCOUNTER — Other Ambulatory Visit (HOSPITAL_COMMUNITY)
Admission: RE | Admit: 2019-06-05 | Discharge: 2019-06-05 | Disposition: A | Discharge status: Home / Self Care | Payer: HRSA Program | Source: Ambulatory Visit | Attending: Pulmonary Disease | Admitting: Pulmonary Disease

## 2019-06-05 DIAGNOSIS — Z20822 Contact with and (suspected) exposure to covid-19: Secondary | ICD-10-CM | POA: Diagnosis not present

## 2019-06-05 DIAGNOSIS — Z01812 Encounter for preprocedural laboratory examination: Secondary | ICD-10-CM | POA: Insufficient documentation

## 2019-06-05 LAB — SARS CORONAVIRUS 2 (TAT 6-24 HRS): SARS Coronavirus 2: NEGATIVE

## 2019-06-07 ENCOUNTER — Inpatient Hospital Stay (HOSPITAL_COMMUNITY): Admission: RE | Admit: 2019-06-07 | Payer: Self-pay | Source: Ambulatory Visit

## 2019-06-07 ENCOUNTER — Other Ambulatory Visit: Payer: Self-pay | Admitting: Radiology

## 2019-06-07 ENCOUNTER — Telehealth: Payer: Self-pay | Admitting: *Deleted

## 2019-06-07 NOTE — Telephone Encounter (Signed)
Oncology Nurse Navigator Documentation  Oncology Nurse Navigator Flowsheets 06/07/2019  Navigator Location [facility]  Navigator Encounter Type Telephone  Telephone Outgoing Call  Treatment Phase Abnormal Scans  Barriers/Navigation Needs Education/I called patient to check on her.  I was able to speak with her.  I updated her that she missed her PFT's.  She states she didn't realize she had that today.  I reminded her about her bx tomorrow. She verbalized understanding of this appt.   Education Other  Interventions Education  Acuity Level 2-Minimal Needs (1-2 Barriers Identified)  Coordination of Care -  Education Method Verbal  Time Spent with Patient 15

## 2019-06-07 NOTE — Telephone Encounter (Signed)
Oncology Nurse Navigator Documentation  Oncology Nurse Navigator Flowsheets 06/07/2019  [facility]igator Location [facility]  Navigator Encounter Type Telephone/I followed up on Ms. Prestridge's schedule.  It doesn't look like she went to her PFT appt.  I contacted resp care to see if she made it.  I called patient but was unable to reach her.  I did leave vm message with my name and phone number to call.   Telephone Outgoing Call  Treatment Phase Abnormal Scans  Barriers/Navigation Needs Education  Education Other  Interventions Education  Acuity Level 2-Minimal Needs (1-2 Barriers Identified)  Coordination of Care -  Education Method Verbal  Time Spent with Patient 15

## 2019-06-08 ENCOUNTER — Ambulatory Visit (HOSPITAL_COMMUNITY): Admission: RE | Admit: 2019-06-08 | Payer: Self-pay | Source: Ambulatory Visit

## 2019-06-09 ENCOUNTER — Telehealth: Payer: Self-pay | Admitting: *Deleted

## 2019-06-09 NOTE — Telephone Encounter (Signed)
Oncology Nurse Navigator Documentation  Oncology Nurse Navigator Flowsheets 06/09/2019  Navigator Location [facility]  Navigator Encounter Type Telephone/I followed up on Brenda [name]'s schedule. She did not make her appt for bx.  I called to check on her. I asked that she call me and left my name and phone number.  I will update Dr. Servando Snare and his office.   Telephone Outgoing Call  Treatment Phase Abnormal Scans  Barriers/Navigation Needs Education  Education Other  Interventions Education  Acuity Level 2-Minimal Needs (1-2 Barriers Identified)  Coordination of Care -  Education Method Verbal  Time Spent with Patient 30

## 2019-06-12 ENCOUNTER — Telehealth: Payer: Self-pay | Admitting: *Deleted

## 2019-06-14 ENCOUNTER — Other Ambulatory Visit: Payer: Self-pay | Admitting: Cardiothoracic Surgery

## 2019-06-14 ENCOUNTER — Telehealth: Payer: Self-pay | Admitting: *Deleted

## 2019-06-16 ENCOUNTER — Other Ambulatory Visit (HOSPITAL_COMMUNITY): Payer: Self-pay

## 2019-06-19 ENCOUNTER — Encounter (HOSPITAL_COMMUNITY): Payer: Self-pay

## 2019-07-05 ENCOUNTER — Telehealth: Payer: Self-pay | Admitting: *Deleted

## 2019-07-05 NOTE — Telephone Encounter (Signed)
patient ready to set up PFT & CT Biopsy, I contacted Resp Ther to sched PFT and Icard office to r/s BX. patient aware of new appointments

## 2019-07-17 ENCOUNTER — Other Ambulatory Visit (HOSPITAL_COMMUNITY)
Admission: RE | Admit: 2019-07-17 | Discharge: 2019-07-17 | Disposition: A | Discharge status: Home / Self Care | Payer: HRSA Program | Source: Ambulatory Visit | Attending: Pulmonary Disease | Admitting: Pulmonary Disease

## 2019-07-17 DIAGNOSIS — Z01812 Encounter for preprocedural laboratory examination: Secondary | ICD-10-CM | POA: Diagnosis present

## 2019-07-17 DIAGNOSIS — Z20822 Contact with and (suspected) exposure to covid-19: Secondary | ICD-10-CM | POA: Diagnosis not present

## 2019-07-17 LAB — SARS CORONAVIRUS 2 (TAT 6-24 HRS): SARS Coronavirus 2: NEGATIVE

## 2019-07-18 ENCOUNTER — Other Ambulatory Visit: Payer: Self-pay | Admitting: Radiology

## 2019-07-18 ENCOUNTER — Other Ambulatory Visit: Payer: Self-pay | Admitting: Student

## 2019-07-19 ENCOUNTER — Ambulatory Visit (HOSPITAL_COMMUNITY): Admission: RE | Admit: 2019-07-19 | Payer: Self-pay | Source: Ambulatory Visit

## 2019-07-19 ENCOUNTER — Telehealth: Payer: Self-pay | Admitting: Pulmonary Disease

## 2019-07-19 NOTE — Telephone Encounter (Signed)
Looks like this was scheduled by [facility] we havent seen this pt since Feb I will send a message to [employer]

## 2019-07-21 ENCOUNTER — Other Ambulatory Visit: Payer: Self-pay

## 2019-07-21 ENCOUNTER — Ambulatory Visit (HOSPITAL_COMMUNITY)
Admission: RE | Admit: 2019-07-21 | Discharge: 2019-07-21 | Disposition: A | Discharge status: Home / Self Care | Payer: Self-pay | Source: Ambulatory Visit | Attending: Cardiothoracic Surgery | Admitting: Cardiothoracic Surgery

## 2019-07-21 DIAGNOSIS — D381 Neoplasm of uncertain behavior of trachea, bronchus and lung: Secondary | ICD-10-CM | POA: Insufficient documentation

## 2019-07-21 LAB — PULMONARY FUNCTION TEST
DL/VA % pred: 61 %
DL/VA: 2.56 ml/min/mmHg/L
DLCO unc % pred: 43 %
DLCO unc: 9.63 ml/min/mmHg
FEF 25-75 Post: 1.18 L/sec
FEF 25-75 Pre: 1.29 L/sec
FEF2575-%Change-Post: -8 %
FEF2575-%Pred-Post: 50 %
FEF2575-%Pred-Pre: 54 %
FEV1-%Change-Post: 3 %
FEV1-%Pred-Post: 79 %
FEV1-%Pred-Pre: 76 %
FEV1-Post: 1.88 L
FEV1-Pre: 1.81 L
FEV1FVC-%Change-Post: 4 %
FEV1FVC-%Pred-Pre: 86 %
FEV6-%Change-Post: -3 %
FEV6-%Pred-Post: 87 %
FEV6-%Pred-Pre: 90 %
FEV6-Post: 2.53 L
FEV6-Pre: 2.62 L
FEV6FVC-%Change-Post: -2 %
FEV6FVC-%Pred-Post: 100 %
FEV6FVC-%Pred-Pre: 103 %
FVC-%Change-Post: 0 %
FVC-%Pred-Post: 87 %
FVC-%Pred-Pre: 87 %
FVC-Post: 2.6 L
FVC-Pre: 2.62 L
Post FEV1/FVC ratio: 72 %
Post FEV6/FVC ratio: 97 %
Pre FEV1/FVC ratio: 69 %
Pre FEV6/FVC Ratio: 100 %
RV % pred: 88 %
RV: 1.77 L
TLC % pred: 80 %
TLC: 4.29 L

## 2019-07-21 MED ORDER — ALBUTEROL SULFATE (2.5 MG/3ML) 0.083% IN NEBU
2.5000 mg | INHALATION_SOLUTION | Freq: Once | RESPIRATORY_TRACT | Status: AC
  Administered 2019-07-21: 2.5 mg via RESPIRATORY_TRACT

## 2019-07-25 NOTE — Telephone Encounter (Signed)
Brenda [name], can this be closed?

## 2019-07-25 NOTE — Telephone Encounter (Signed)
We do not schedule these but I did call Central Scheduling and spoke to [place].  He is going to send a message to Aniceto Boss who works this alpha and make her aware pt still needs to have Biopsy and ask her to schedule.

## 2019-07-25 NOTE — Telephone Encounter (Signed)
Received msg back from Drue Second, Caren Griffins A  You 31 minutes ago (3:09 PM)   Patient still needs to have this CT BX.    Will forward to [facility] to schedule the CT BX

## 2019-07-25 NOTE — Telephone Encounter (Signed)
Will

## 2019-07-26 ENCOUNTER — Telehealth: Payer: Self-pay | Admitting: *Deleted

## 2019-07-26 NOTE — Telephone Encounter (Signed)
ATC patient straight to voicemail. Only number for patient in chart.  Left message to call our office back. Will let DS know

## 2019-07-26 NOTE — Telephone Encounter (Signed)
PCCs please advise if you schedule CT Biopsies- I see an order already placed by Dr. Valeta Harms.  Thanks!

## 2019-07-26 NOTE — Telephone Encounter (Signed)
Per 4/28 phone note it looks like Judeen Hammans has contacted central scheduling to have this rescheduled.  Number for central scheduling is [phone].  CT biopsy order is still active so a new order would not be necessary.    lmtcb X1 for pt- she needs to call central scheduling to reschedule this CT biopsy as neither the nurses or the PCCs in our office schedule these procedures.

## 2019-07-26 NOTE — Telephone Encounter (Signed)
Pt is calling regarding setting up ap[pt for CT biopsy - please call [phone]

## 2019-07-26 NOTE — Telephone Encounter (Signed)
-----   Message from Candee Furbish, MD sent at 07/25/2019  1:56 PM EDT ----- Regarding: FW: Can you have someone reach out to patient? Let me know if no response and I'll send certified letter.  Thanks Dan ----- Message ----- From: Laury Deep, RN Sent: 07/25/2019  12:18 PM EDT To: Elesa Hacker, MD  Dr. Tamala Julian,  I am Thurmond Butts (Dr. Everrett Coombe nurse).  You referred this patient to Dr. Servando Snare in Feb and we have tried multiple times to get a CT bx & PFTs.  We have tried and tried and she finally got her PFTs but No showed her CT bx again.  Can someone in your office follow up and try and get her Bx so she can get treated?  Thanks, [employer]

## 2019-07-26 NOTE — Telephone Encounter (Signed)
Seems there are 2 message open on this pt the other one is from 07/19/19 PCC's do not schedule these the hospital pulls it from the [employer] we may need a new order just to make it flow like it's supposed to

## 2019-07-27 ENCOUNTER — Ambulatory Visit: Payer: Self-pay | Admitting: Cardiothoracic Surgery

## 2019-07-27 NOTE — Telephone Encounter (Signed)
Just so we have the notes

## 2019-07-27 NOTE — Telephone Encounter (Signed)
I called Central Scheduling and spoke to Team Lead Vivien Rota.  She states pt has been scheduled and no showed 3 times.  They usually do not call pt back after 3 times. She states she is getting ready to go into meeting but she will call this afternoon and try to get pt scheduled again.  She saw in note [facility] gave pt phone # to call them to reschedule.  I called pt myself just now &t left her a vm to call Central Scheduling to reschedule.

## 2019-07-27 NOTE — Telephone Encounter (Signed)
I will call back and check on it.

## 2019-07-27 NOTE — Telephone Encounter (Signed)
It doesn't look like this has been rescheduled.  Can you guys follow up on this?

## 2019-07-31 NOTE — Telephone Encounter (Signed)
Ct Biopsy scheduled for  08/10/2019

## 2019-08-08 ENCOUNTER — Other Ambulatory Visit (HOSPITAL_COMMUNITY)
Admission: RE | Admit: 2019-08-08 | Discharge: 2019-08-08 | Disposition: A | Discharge status: Home / Self Care | Payer: HRSA Program | Source: Ambulatory Visit | Attending: Pulmonary Disease | Admitting: Pulmonary Disease

## 2019-08-08 DIAGNOSIS — Z01812 Encounter for preprocedural laboratory examination: Secondary | ICD-10-CM | POA: Insufficient documentation

## 2019-08-08 DIAGNOSIS — Z20822 Contact with and (suspected) exposure to covid-19: Secondary | ICD-10-CM | POA: Insufficient documentation

## 2019-08-08 LAB — SARS CORONAVIRUS 2 (TAT 6-24 HRS): SARS Coronavirus 2: NEGATIVE

## 2019-08-09 ENCOUNTER — Other Ambulatory Visit: Payer: Self-pay | Admitting: Student

## 2019-08-10 ENCOUNTER — Ambulatory Visit (HOSPITAL_COMMUNITY)
Admission: RE | Admit: 2019-08-10 | Discharge: 2019-08-10 | Disposition: A | Discharge status: Home / Self Care | Payer: Medicaid Other | Source: Ambulatory Visit | Attending: Pulmonary Disease | Admitting: Pulmonary Disease

## 2019-08-10 ENCOUNTER — Other Ambulatory Visit: Payer: Self-pay

## 2019-08-10 DIAGNOSIS — C3411 Malignant neoplasm of upper lobe, right bronchus or lung: Secondary | ICD-10-CM | POA: Insufficient documentation

## 2019-08-10 DIAGNOSIS — R918 Other nonspecific abnormal finding of lung field: Secondary | ICD-10-CM

## 2019-08-10 LAB — CBC
HCT: 37.4 % (ref 36.0–46.0)
Hemoglobin: 12.2 g/dL (ref 12.0–15.0)
MCH: 32.9 pg (ref 26.0–34.0)
MCHC: 32.6 g/dL (ref 30.0–36.0)
MCV: 100.8 fL — ABNORMAL HIGH (ref 80.0–100.0)
Platelets: 242 10*3/uL (ref 150–400)
RBC: 3.71 MIL/uL — ABNORMAL LOW (ref 3.87–5.11)
RDW: 14.7 % (ref 11.5–15.5)
WBC: 3.1 10*3/uL — ABNORMAL LOW (ref 4.0–10.5)
nRBC: 0 % (ref 0.0–0.2)

## 2019-08-10 LAB — PROTIME-INR
INR: 1.1 (ref 0.8–1.2)
Prothrombin Time: 13.3 seconds (ref 11.4–15.2)

## 2019-08-10 IMAGING — CT CT BIOPSY
1 of 2 series · 14 of 32 positions shown, 19 images · non-contrast
Comparison: none

INDICATION: PET positive posteromedial right upper lobe nodule compatible with
lung cancer

[Series 2: i-spiral 5.0 b40f · axial · 0.59mm/px · z∈[+1137,+1273]mm · 14 of 45 slices shown, 19 images]
[im 3/45  soft-tissue]
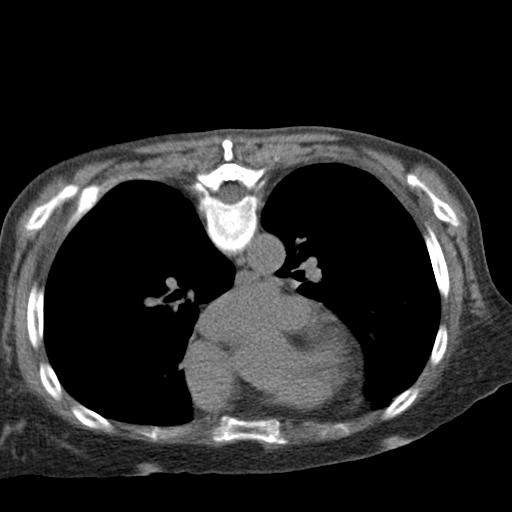
[im 3/45  bone]
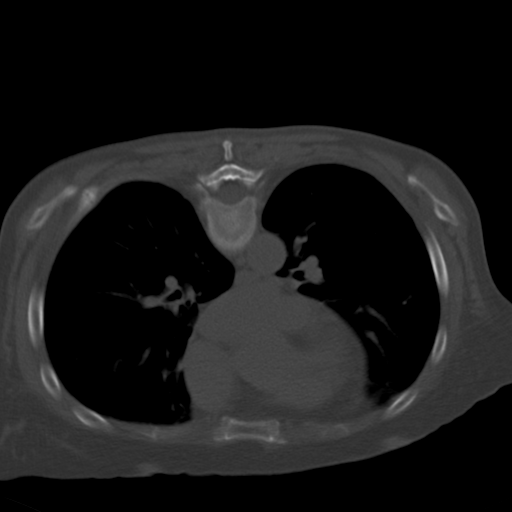
[im 7/45  soft-tissue]
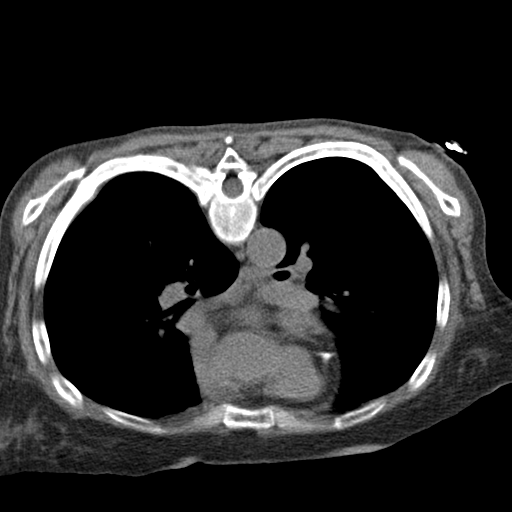
[im 9/45  soft-tissue]
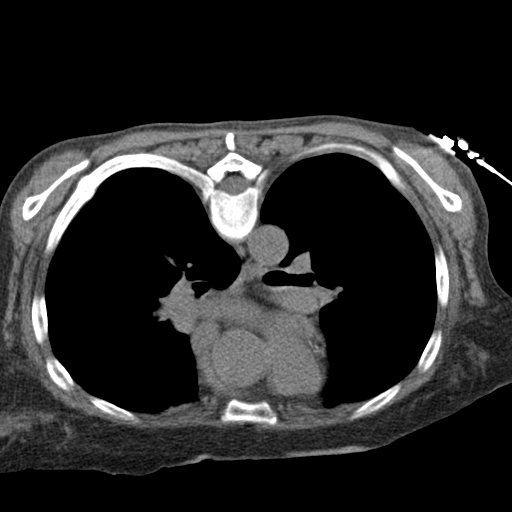
[im 14/45  soft-tissue]
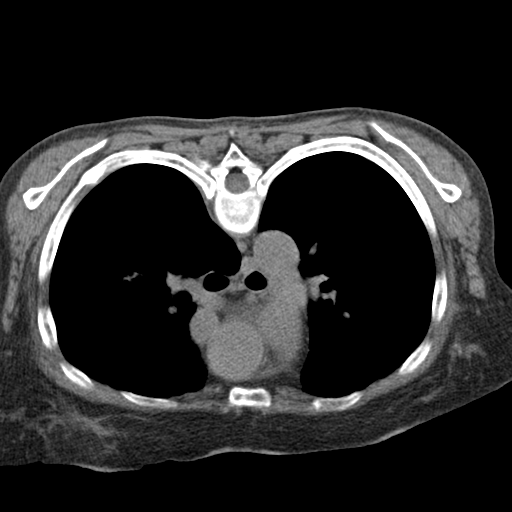
[im 16/45  soft-tissue]
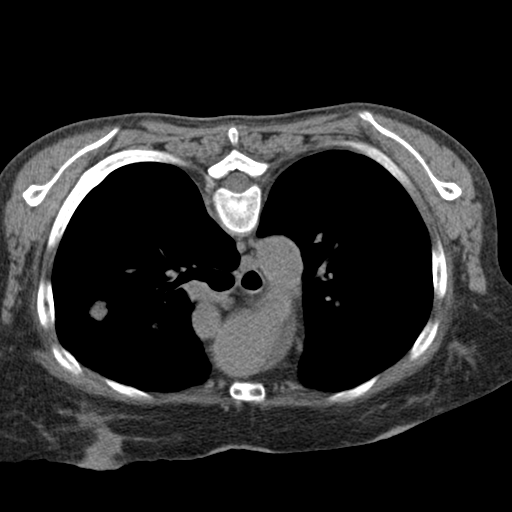
[im 20/45  soft-tissue]
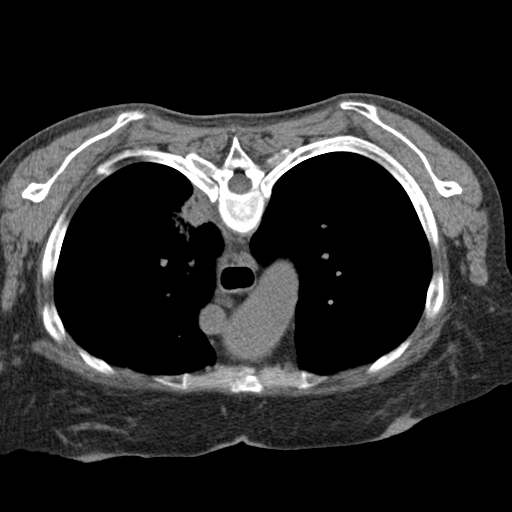
[im 23/45  soft-tissue]
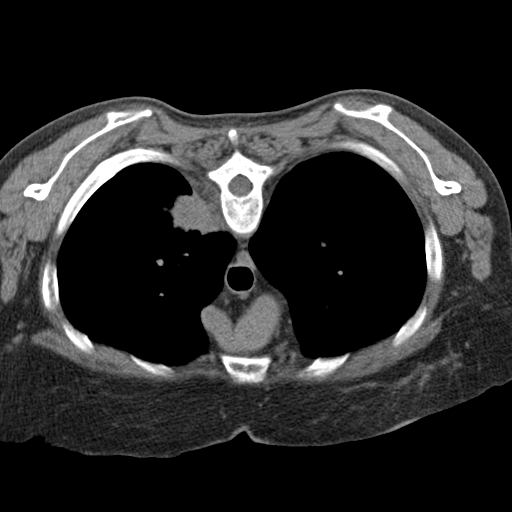
[im 25/45  soft-tissue]
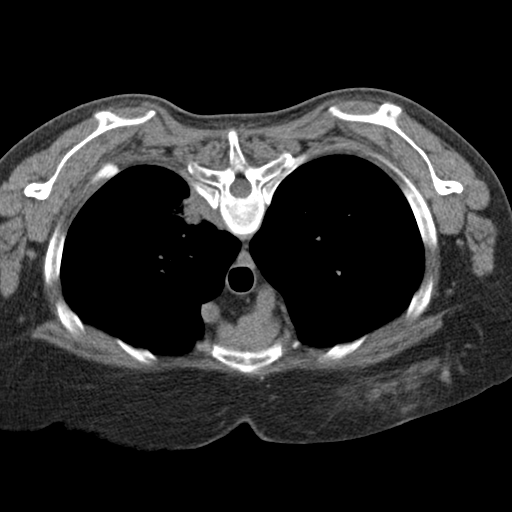
[im 29/45  soft-tissue]
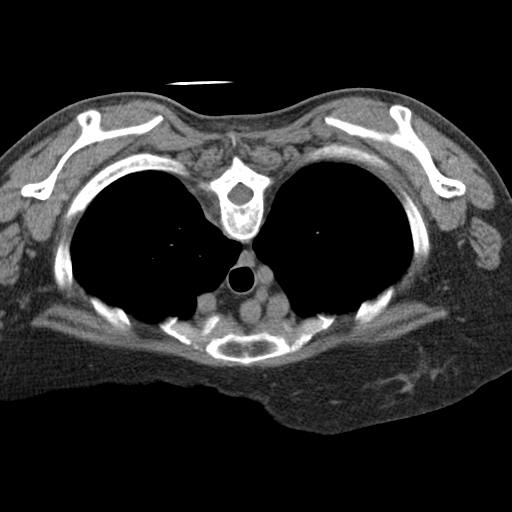
[im 29/45  bone]
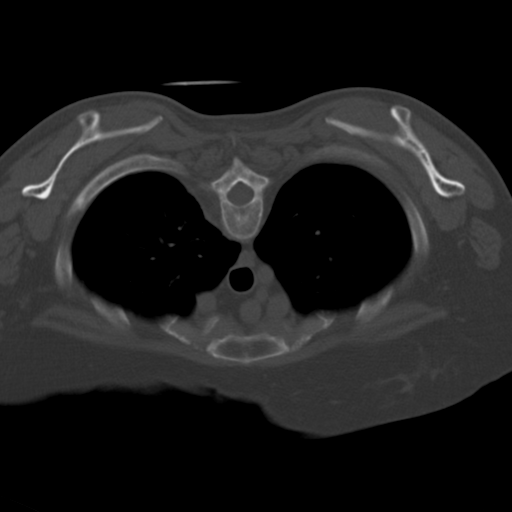
[im 31/45  soft-tissue]
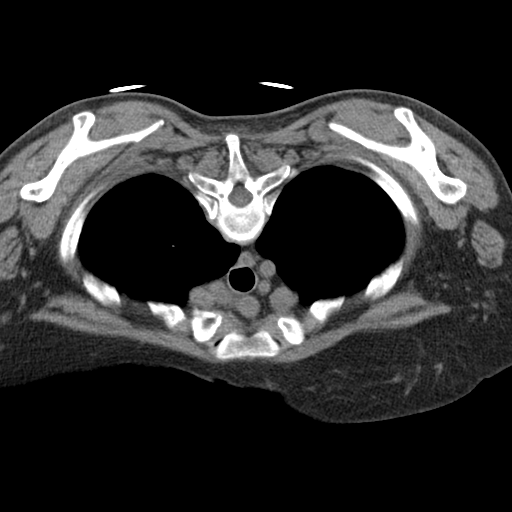
[im 36/45  soft-tissue]
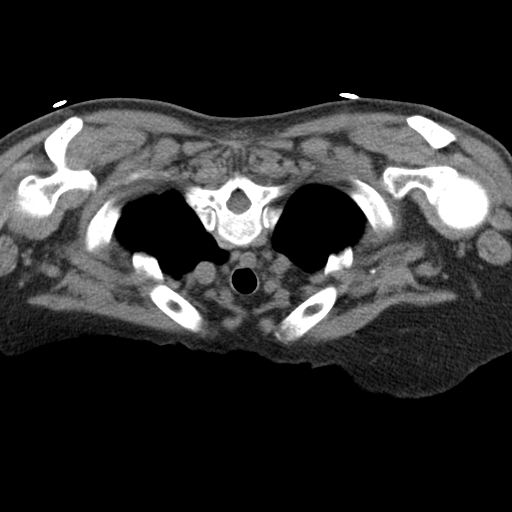
[im 36/45  lung]
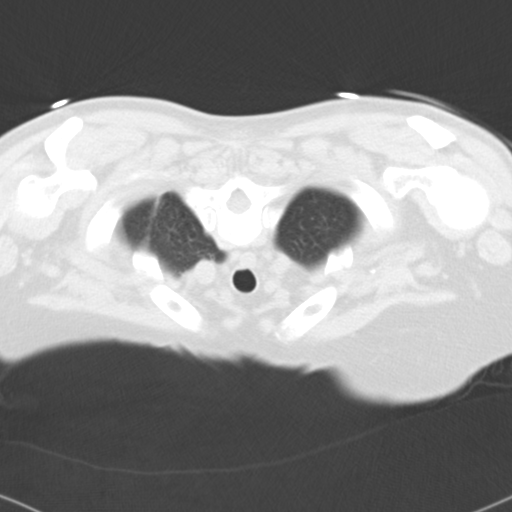
[im 38/45  soft-tissue]
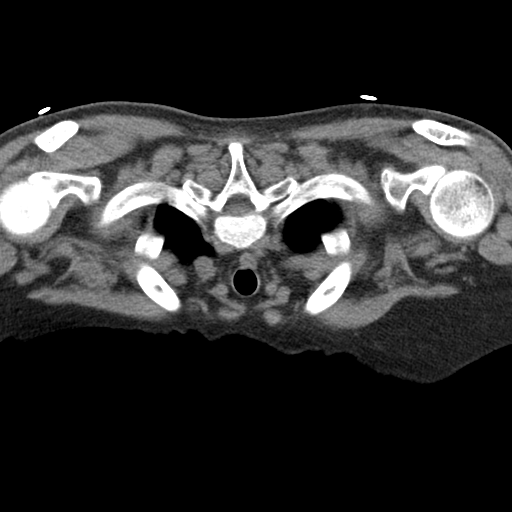
[im 38/45  lung]
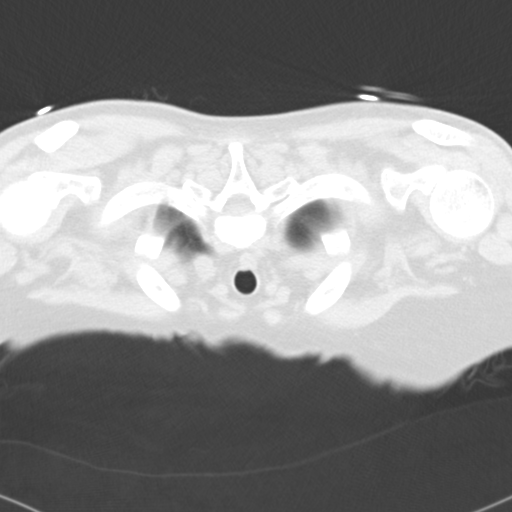
[im 40/45  lung]
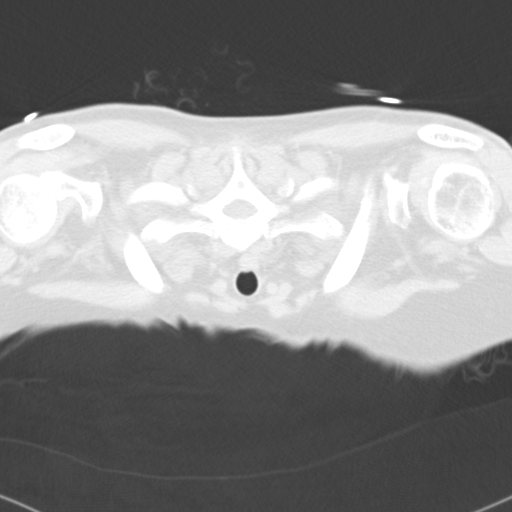
[im 42/45  soft-tissue]
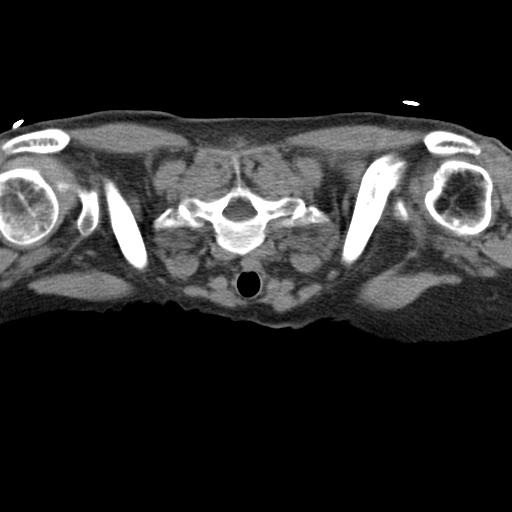
[im 42/45  lung]
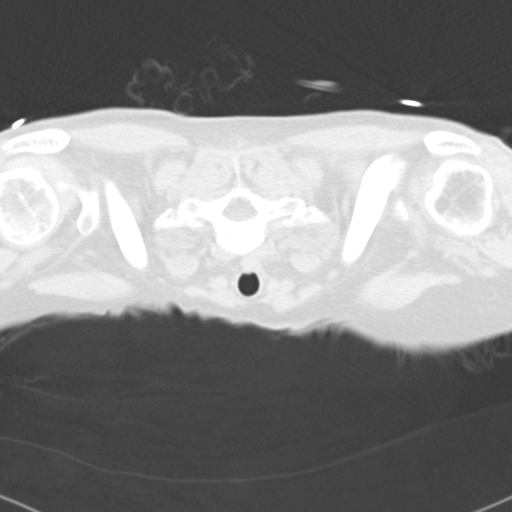

[14 of 32 positions shown; findings below may reference images not displayed]

EXAM:
CT-GUIDED BIOPSY POSTERIOR RIGHT UPPER LOBE NODULE

MEDICATIONS:
1% lidocaine local

ANESTHESIA/SEDATION:
1.0 mg IV Versed; 50 mcg IV Fentanyl

Moderate Sedation Time:  14 minutes

The patient was continuously monitored during the procedure by the
interventional radiology nurse under my direct supervision.

PROCEDURE:
The procedure, risks, benefits, and alternatives were explained to
the patient. Questions regarding the procedure were encouraged and
answered. The patient understands and consents to the procedure.

Previous imaging reviewed. Patient positioned prone. Noncontrast
localization CT performed. The right upper lobe posteromedial nodule
along the pleura was localized and marked for a paraspinous
approach.

Under sterile conditions and local anesthesia, a 17 gauge coaxial
guide needle was advanced to the nodule along the medial margin.
Needle position confirmed with CT. Three 1 cm 18 gauge core biopsies
obtained. These were placed in formalin. Samples were intact and non
fragmented. Needle tract occluded with the bio sentry device.
Postprocedure imaging demonstrates no hemorrhage, effusion or
pneumothorax.

Patient tolerated the procedure well without complication. Vital
sign monitoring by nursing staff during the procedure will continue
as patient is in the special procedures unit for post procedure
observation.
FINDINGS: The images document guide needle placement within the posteromedial
right upper lobe nodule. Post biopsy images demonstrate no
pneumothorax or hemorrhage.

COMPLICATIONS:
None immediate.
IMPRESSION: Successful CT-guided core biopsy of the posterior right upper lobe
nodule

## 2019-08-10 MED ORDER — MIDAZOLAM HCL 2 MG/2ML IJ SOLN
INTRAMUSCULAR | Status: AC | PRN
  Administered 2019-08-10 (×2): 0.5 mg via INTRAVENOUS

## 2019-08-10 MED ORDER — FENTANYL CITRATE (PF) 100 MCG/2ML IJ SOLN
INTRAMUSCULAR | Status: AC
  Filled 2019-08-10: qty 2

## 2019-08-10 MED ORDER — FENTANYL CITRATE (PF) 100 MCG/2ML IJ SOLN
INTRAMUSCULAR | Status: AC | PRN
  Administered 2019-08-10 (×2): 25 ug via INTRAVENOUS

## 2019-08-10 MED ORDER — SODIUM CHLORIDE 0.9 % IV SOLN: INTRAVENOUS | Status: DC

## 2019-08-10 MED ORDER — MIDAZOLAM HCL 2 MG/2ML IJ SOLN
INTRAMUSCULAR | Status: AC
  Filled 2019-08-10: qty 2

## 2019-08-10 MED ORDER — LIDOCAINE HCL 1 % IJ SOLN
INTRAMUSCULAR | Status: AC
  Filled 2019-08-10: qty 20

## 2019-08-10 NOTE — Discharge Instructions (Signed)
Biopsy Discharge Instructions  The procedure you just had is called a biopsy.  You may feel some discomfort after the local anesthetic wears off.  Your discomfort should improve over the next several days.  AFTER YOUR BIOPSY  Rest for the remainder of the day.  Avoid heavy lifting (more than 10 lb/4.5 kg).  If you have been given a general anesthetic or other medications to help you relax, you should not operate machinery, drive or make legal decisions for 24 hours after your procedure.  Additionally, someone must be available to drive you home.  Only take over-the-counter or prescription medicines for pain, discomfort, or fever as directed by your caregiver.  This can make bleeding worse.  You may resume your usual diet after the procedure.  Avoid alcoholic beverages for 24 hours after your procedure.  Keep the skin around your biopsy site clean and dry.  You may shower after 24 hours.  Cleanse and dry the biopsy site completely after you shower.  Avoid baths and swimming for 72 hours.  Complications are very uncommon after this procedure.  Go to the nearest Emergency Department or contact your caregiver if you develop any of the following symptoms:  Worsening pain  Bleeding  Swelling at the biopsy site  Light headedness or dizziness  Shortness of Breath  Fever or chills Redness or increased pain or swelling at the biopsy site   Moderate Conscious Sedation, Adult, Care After These instructions provide you with information about caring for yourself after your procedure. Your health care provider may also give you more specific instructions. Your treatment has been planned according to current medical practices, but problems sometimes occur. Call your health care provider if you have any problems or questions after your procedure. What can I expect after the procedure? After your procedure, it is common:  To feel sleepy for several hours.  To feel clumsy and have poor balance  for several hours.  To have poor judgment for several hours.  To vomit if you eat too soon. Follow these instructions at home: For at least 24 hours after the procedure:   Do not: ? Participate in activities where you could fall or become injured. ? Drive. ? Use heavy machinery. ? Drink alcohol. ? Take sleeping pills or medicines that cause drowsiness. ? Make important decisions or sign legal documents. ? Take care of children on your own.  Rest. Eating and drinking  Follow the diet recommended by your health care provider.  If you vomit: ? Drink water, juice, or soup when you can drink without vomiting. ? Make sure you have little or no nausea before eating solid foods. General instructions  Have a responsible adult stay with you until you are awake and alert.  Take over-the-counter and prescription medicines only as told by your health care provider.  If you smoke, do not smoke without supervision.  Keep all follow-up visits as told by your health care provider. This is important. Contact a health care provider if:  You keep feeling nauseous or you keep vomiting.  You feel light-headed.  You develop a rash.  You have a fever. Get help right away if:  You have trouble breathing. This information is not intended to replace advice given to you by your health care provider. Make sure you discuss any questions you have with your health care provider. Document Revised: 02/19/2017 Document Reviewed: 06/29/2015 Elsevier Patient Education  2020 [employer].

## 2019-08-10 NOTE — H&P (Signed)
Chief Complaint: Patient was seen in consultation today for right upper lobe lung lesion biopsy.  Referring Physician(s): Icard,Bradley L  Supervising Physician: Corrie Mckusick  Patient Status: Brenda [name] - Out-pt  History of Present Illness: Brenda [name] is a 59 y.o. female with a past medical history significant for depression, tobacco use and right upper lobe lung lesion who presents today for an image guided lung lesion biopsy/possible chest tube placement. Brenda [name] first presented to the ED on 04/03/19 due to right sided chest pain after a fall. Initial workup showed her to be in acute respiratory failure for which she was admitted for further evaluation and management. CTA chest was performed which showed a 3.1 x 1.9 cm pleural based mass in the right upper lobe and a 1.4 x 1.2 cm nodule also in the right upper lobe, both of which concerning for malignancy. She underwent a bronchoscopy with pulmonology on 1/12 which was non-diagnostic. She was discharged on 1/13 with planned outpatient follow up with pulmonology. She underwent a PET scan on 1/26 which showed hypermetabolic activity within the previously seen lung lesions. IR has been asked to perform an image guided biopsy of a lung lesion for which she presents today.   Brenda [name] states that she feels ok today, she denies any dyspnea at rest or with regular walking (such as from car to home, couch to bathroom, etc.) but she does have significant trouble with catching her breath when walking up the stairs. She does not wear oxygen but does have an oxygen tank at home that she has never used. She denies any chest pain or hemoptysis. She states understanding of the requested procedure and wishes to proceed as planned.  Past Medical History:  Diagnosis Date  . Depression   . Mental disorder     Past Surgical History:  Procedure Laterality Date  . TUBAL LIGATION    . VIDEO BRONCHOSCOPY WITH ENDOBRONCHIAL NAVIGATION N/A 04/04/2019   Procedure: VIDEO BRONCHOSCOPY WITH ENDOBRONCHIAL NAVIGATION;  Surgeon: Garner Nash, DO;  Location: [facility];  Service: Thoracic;  Laterality: N/A;  . VIDEO BRONCHOSCOPY WITH ENDOBRONCHIAL ULTRASOUND N/A 04/04/2019   Procedure: VIDEO BRONCHOSCOPY WITH ENDOBRONCHIAL ULTRASOUND;  Surgeon: Garner Nash, DO;  Location: [facility];  Service: Thoracic;  Laterality: N/A;    Allergies: Aspirin adult low [aspirin]  Medications: Prior to Admission medications   Medication Sig Start Date End Date Taking? Authorizing Provider  guaiFENesin (MUCINEX) 600 MG 12 hr tablet Take 1 tablet (600 mg total) by mouth 2 (two) times daily as needed for cough or to loosen phlegm. Patient not taking: Reported on 05/09/2019 04/05/19   Lyndee Hensen, DO  oxyCODONE-acetaminophen (PERCOCET/ROXICET) 5-325 MG tablet Take 1 tablet by mouth every 6 (six) hours as needed for severe pain. Patient not taking: Reported on 05/09/2019 04/19/19   Garner Nash, DO     No family history on file.  Social History   Socioeconomic History  . Marital status: Legally Separated    Spouse name: Not on file  . Number of children: Not on file  . Years of education: Not on file  . Highest education level: Not on file  Occupational History  . Not on file  Tobacco Use  . Smoking status: Current Every Day Smoker    Packs/day: 0.50    Years: 30.00    Pack years: 15.00    Types: Cigarettes  . Smokeless tobacco: Never Used  Substance and Sexual Activity  . Alcohol use: Yes  . Drug  use: Never  . Sexual activity: Not on file  Other Topics Concern  . Not on file  Social History Narrative   ** Merged History Encounter **       Social Determinants of Health   Financial Resource Strain:   . Difficulty of Paying Living Expenses:   Food Insecurity:   . Worried About Charity fundraiser in the Last Year:   . Arboriculturist in the Last Year:   Transportation Needs:   . Film/video editor (Medical):   Marland Kitchen Lack of Transportation  (Non-Medical):   Physical Activity:   . Days of Exercise per Week:   . Minutes of Exercise per Session:   Stress:   . Feeling of Stress :   Social Connections:   . Frequency of Communication with Friends and Family:   . Frequency of Social Gatherings with Friends and Family:   . Attends Religious Services:   . Active Member of Clubs or Organizations:   . Attends Archivist Meetings:   Marland Kitchen Marital Status:      Review of Systems: A 12 point ROS discussed and pertinent positives are indicated in the HPI above.  All other systems are negative.  Review of Systems  Constitutional: Negative for chills and fever.  Respiratory: Positive for shortness of breath (only when walking upstairs). Negative for cough.   Cardiovascular: Negative for chest pain.  Gastrointestinal: Negative for abdominal pain, blood in stool, diarrhea, nausea and vomiting.  Genitourinary: Negative for hematuria.  Musculoskeletal: Negative for back pain.  Skin: Negative for wound.  Neurological: Negative for dizziness and headaches.    Vital Signs: BP (!) 142/87   Pulse 75   Temp 97.7 F (36.5 C) (Skin)   Resp 18   Ht 5\' 6"  (1.676 m)   Wt 119 lb (54 kg)   LMP 01/19/2011   SpO2 100%   BMI 19.21 kg/m   Physical Exam Vitals reviewed.  Constitutional:      General: She is not in acute distress. HENT:     Head: Normocephalic.     Mouth/Throat:     Mouth: Mucous membranes are moist.     Pharynx: Oropharynx is clear. No oropharyngeal exudate or posterior oropharyngeal erythema.  Cardiovascular:     Rate and Rhythm: Normal rate and regular rhythm.  Pulmonary:     Effort: Pulmonary effort is normal.     Breath sounds: Normal breath sounds.  Abdominal:     General: There is no distension.     Palpations: Abdomen is soft.     Tenderness: There is no abdominal tenderness.  Skin:    General: Skin is warm and dry.  Neurological:     Mental Status: She is alert and oriented to person, place, and  time.  Psychiatric:        Mood and Affect: Mood normal.        Behavior: Behavior normal.        Thought Content: Thought content normal.        Judgment: Judgment normal.      MD Evaluation Airway: WNL Heart: WNL Abdomen: WNL Chest/ Lungs: WNL ASA  Classification: 3 Mallampati/Airway Score: One   Imaging: No results found.  Labs:  CBC: Recent Labs    04/03/19 0651 04/04/19 0228 08/10/19 0911  WBC 7.0 11.5* 3.1*  HGB 14.0 11.8* 12.2  HCT 40.5 35.3* 37.4  PLT 239 201 242    COAGS: Recent Labs    04/04/19 0228 08/10/19  9629  INR 1.1 1.1  APTT 32  --     BMP: Recent Labs    04/03/19 0651 04/04/19 0228  NA 135 134*  K 3.4* 3.4*  CL 99 99  CO2 26 26  GLUCOSE 116* 111*  BUN <5* 10  CALCIUM 8.9 8.8*  CREATININE 0.54 0.63  GFRNONAA >60 >60  GFRAA >60 >60    LIVER FUNCTION TESTS: Recent Labs    04/04/19 0228  BILITOT 0.8  AST 13*  ALT 11  ALKPHOS 83  PROT 6.1*  ALBUMIN 2.7*    TUMOR MARKERS: No results for input(s): AFPTM, CEA, CA199, CHROMGRNA in the last 8760 hours.  Assessment and Plan:  59 y/o F incidentally found to have two right upper lobe lung lesion after a fall in January of this year who presents today for an image guided lung lesion biopsy. Patient previously reviewed by Dr. Anselm Pancoast who approved procedure.  Patient has been NPO since 10 pm last night, no anticoagulation/anitplatelet therapy, she did not take any medications this morning. Afebrile, WBC 3.1, hgb 12.2, plt 242, INR 1.1, COVID (-) 08/08/19.  Risks and benefits discussed with the patient including, but not limited to bleeding, hemoptysis, respiratory failure requiring intubation, infection, pneumothorax requiring chest tube placement, stroke from air embolism or even death.  All of the patient's questions were answered, patient is agreeable to proceed.  Consent signed and in chart.  Thank you for this interesting consult.  I greatly enjoyed meeting Jamilette Suchocki and  look forward to participating in their care.  A copy of this report was sent to the requesting provider on this date.  Electronically Signed: Joaquim Nam, PA-C 08/10/2019, 10:05 AM   I spent a total of 30 Minutesin face to face in clinical consultation, greater than 50% of which was counseling/coordinating care for lung lesion biopsy.

## 2019-08-10 NOTE — Procedures (Signed)
Interventional Radiology Procedure Note  Procedure: CT lung bx RUL nodule  Complications: None  Estimated Blood Loss: min  Findings: 18 g core x 3

## 2019-08-10 NOTE — Progress Notes (Signed)
Patient and son was given discharge instructions. Both verbalized understanding.

## 2019-08-11 LAB — SURGICAL PATHOLOGY

## 2019-08-17 ENCOUNTER — Telehealth: Payer: Self-pay | Admitting: *Deleted

## 2019-08-17 ENCOUNTER — Other Ambulatory Visit: Payer: Self-pay | Admitting: *Deleted

## 2019-08-17 NOTE — Telephone Encounter (Signed)
Per Cancer Conference, Dr. Julien Nordmann would like Foundation one and PDL 1 to be sent.  I updated pathology dept to send.

## 2019-08-17 NOTE — Progress Notes (Signed)
The proposed discussion at cancer conference 07/2719 is for discussion purpose only and is not a binding recommendation.  The patient was not physically examined nor present for their treatment options.  Therefore, final treatment plans cannot be decided.

## 2019-08-23 ENCOUNTER — Encounter (HOSPITAL_COMMUNITY): Payer: Self-pay | Admitting: Internal Medicine

## 2019-08-24 ENCOUNTER — Other Ambulatory Visit: Payer: Self-pay

## 2019-08-24 ENCOUNTER — Other Ambulatory Visit: Payer: Self-pay | Admitting: *Deleted

## 2019-08-24 ENCOUNTER — Ambulatory Visit (INDEPENDENT_AMBULATORY_CARE_PROVIDER_SITE_OTHER): Payer: Self-pay | Admitting: Cardiothoracic Surgery

## 2019-08-24 ENCOUNTER — Encounter: Payer: Self-pay | Admitting: *Deleted

## 2019-08-24 ENCOUNTER — Other Ambulatory Visit: Payer: Self-pay | Admitting: Cardiothoracic Surgery

## 2019-08-24 VITALS — BP 115/81 | HR 124 | Temp 97.9°F | Resp 28 | Ht 66.0 in | Wt 112.0 lb

## 2019-08-24 DIAGNOSIS — R918 Other nonspecific abnormal finding of lung field: Secondary | ICD-10-CM

## 2019-08-24 DIAGNOSIS — D381 Neoplasm of uncertain behavior of trachea, bronchus and lung: Secondary | ICD-10-CM

## 2019-08-24 DIAGNOSIS — C3411 Malignant neoplasm of upper lobe, right bronchus or lung: Secondary | ICD-10-CM

## 2019-08-24 DIAGNOSIS — G3281 Cerebellar ataxia in diseases classified elsewhere: Secondary | ICD-10-CM

## 2019-08-24 NOTE — Progress Notes (Addendum)
[address]       [name] [address]             [phone]                    Brenda [name] [facility] Medical Record #[id number] Date of Birth: Jun 05, 1960  Referring: Candee Furbish, MD Primary Care: Lyndee Hensen, DO Primary Cardiologist: No primary care provider on file.  Chief Complaint:    Chief Complaint  Patient presents with  . Lung Lesion    further discuss surgery, s/p CT biopsy 08/10/19, PET 07/21/19    History of Present Illness:    Brenda [name] 59 y.o. female was seen in the office in February, at that time her work-up was incomplete.   The patient was referred by pulmonary service.Pulmonary function studies are incomplete-diffusion capacity was not done because of lack of Covid test.  Bronchoscopy of the pulmonary service was done and biopsies obtained but were nondiagnostic . She was scheduled for  needle directed CT biopsy of posterior left upper lobe mass involving the pleura and/or chest wall on February 22 but patient did not show for bx   The patient is a daily smoker and although has decreased some continues to smoke .  She has underlying lung disease both by CT and functionally, she notes she is able to walk around her house without significant shortness of breath but is unable to climb a flight of stairs without difficulty.  On January 11 the patient notes that she fell striking her right side on the arm of her sofa, following this she had increasing right pleuritic rib pain and noted "ribs moving ".  She was admitted to the hospital with acute respiratory failure requiring nonrebreather oxygen CT scan of the chest was done that demonstrated 3 cm pleural-based right upper lobe posterior mass in addition to the 2 smaller lesions in the right upper lobe, all 3 hypermetabolic.  In addition she had a left lower lobe infiltrative mass and was treated for pneumonia with IV antibiotics  Bronchoscopy was performed on January 12 with Dr.  Alessandra Bevels definitive diagnosis was made from biopsies of the right upper lobe.  Since originally seen multiple contacts with the patient to get her work-up completed has been attempted ultimately she had full PFTs which demonstrated severe diffusion deficit 40%, FEV1 1.6-1.8.  Subsequent CT scan suggest a new lesion in the left lung.  A CT-guided needle biopsy of the 3 cm pleural-based mass was done confirming malignancy  Patient notes she quit smoking in March  Current Activity/ Functional Status:  Patient is independent with mobility/ambulation, transfers, ADL's, IADL's.   Zubrod Score: At the time of surgery this patient's most appropriate activity status/level should be described as: []     0    Normal activity, no symptoms [x]     1    Restricted in physical strenuous activity but ambulatory, able to do out light work []     2    Ambulatory and capable of self care, unable to do work activities, up and about               >50 % of waking hours                              []     3    Only limited self care, in bed greater than 50% of waking  hours []     4    Completely disabled, no self care, confined to bed or chair []     5    Moribund   Past Medical History:  Diagnosis Date  . Depression   . Mental disorder     Past Surgical History:  Procedure Laterality Date  . TUBAL LIGATION    . VIDEO BRONCHOSCOPY WITH ENDOBRONCHIAL NAVIGATION N/A 04/04/2019   Procedure: VIDEO BRONCHOSCOPY WITH ENDOBRONCHIAL NAVIGATION;  Surgeon: Garner Nash, DO;  Location: [facility];  Service: Thoracic;  Laterality: N/A;  . VIDEO BRONCHOSCOPY WITH ENDOBRONCHIAL ULTRASOUND N/A 04/04/2019   Procedure: VIDEO BRONCHOSCOPY WITH ENDOBRONCHIAL ULTRASOUND;  Surgeon: Garner Nash, DO;  Location: [facility];  Service: Thoracic;  Laterality: N/A;    Family history is significant for her mother.  Died with colon cancer, 1 brother is alive history of unknown malignancy treated with radiation chemo  Social History    Tobacco Use  Smoking Status Current Every Day Smoker  . Packs/day: 0.50  . Years: 30.00  . Pack years: 15.00  . Types: Cigarettes  Smokeless Tobacco Never Used    Social History   Substance and Sexual Activity  Alcohol Use Yes     Allergies  Allergen Reactions  . Aspirin Adult Low [Aspirin]     Stomach upset    Current Outpatient Medications  Medication Sig Dispense Refill  . guaiFENesin (MUCINEX) 600 MG 12 hr tablet Take 1 tablet (600 mg total) by mouth 2 (two) times daily as needed for cough or to loosen phlegm.    Marland Kitchen oxyCODONE-acetaminophen (PERCOCET/ROXICET) 5-325 MG tablet Take 1 tablet by mouth every 6 (six) hours as needed for severe pain. 30 tablet 0   No current facility-administered medications for this visit.      Review of Systems:     Cardiac Review of Systems: [Y] = yes  or   [ N ] = no   Chest Pain [  N ]  Resting SOB Aqua.Slicker  ] Exertional SOB  [ Y ]  Orthopnea Aqua.Slicker ]   Pedal Edema Aqua.Slicker ]    Palpitations Aqua.Slicker ] Syncope  [ N ]   Presyncope Aqua.Slicker   ]   General Review of Systems: [Y] = yes [  ]=no Constitional: recent weight change [  ];  Wt loss over the last 3 months [   ] anorexia [  ]; fatigue [  ]; nausea [  ]; night sweats [  ]; fever [  ]; or chills [  ];           Eye : blurred vision [  ]; diplopia [   ]; vision changes [  ];  Amaurosis fugax[  ]; Resp: cough [  ];  wheezing[  ];  hemoptysis[  ]; shortness of breath[  ]; paroxysmal nocturnal dyspnea[  ]; dyspnea on exertion[  ]; or orthopnea[  ];  GI:  gallstones[  ], vomiting[  ];  dysphagia[  ]; melena[  ];  hematochezia [  ]; heartburn[  ];   Hx of  Colonoscopy[  ]; GU: kidney stones [  ]; hematuria[  ];   dysuria [  ];  nocturia[  ];  history of     obstruction [  ]; urinary frequency [  ]             Skin: rash, swelling[  ];, hair loss[  ];  peripheral edema[  ];  or itching[  ]; Musculosketetal: myalgias[  ];  joint swelling[  ];  joint erythema[  ];  joint pain[  ];  back pain[  ];  Heme/Lymph: bruising[   ];  bleeding[  ];  anemia[  ];  Neuro: TIA[  ];  headaches[  ];  stroke[  ];  vertigo[  ];  seizures[  ];   paresthesias[  ];  difficulty walking[  ];  Psych:depression[  ]; anxiety[  ];  Endocrine: diabetes[  ];  thyroid dysfunction[  ];  Immunizations: Flu up to date [  ]; Pneumococcal up to date [  ];  Other:     PHYSICAL EXAMINATION: BP 115/81   Pulse (!) 124 Comment: regular  Temp 97.9 F (36.6 C) (Temporal)   Resp (!) 28   Ht 5\' 6"  (1.676 m)   Wt 112 lb (50.8 kg)   LMP 01/19/2011   SpO2 98% Comment: RA  BMI 18.08 kg/m  General appearance: alert, cooperative, appears older than stated age and no distress Head: Normocephalic, without obvious abnormality, atraumatic Neck: no adenopathy, no carotid bruit, no JVD, supple, symmetrical, trachea midline and thyroid not enlarged, symmetric, no tenderness/mass/nodules Lymph nodes: Cervical, supraclavicular, and axillary nodes normal. Resp: clear to auscultation bilaterally Back: symmetric, no curvature. ROM normal. No CVA tenderness. Cardio: regular rate and rhythm, S1, S2 normal, no murmur, click, rub or gallop GI: soft, non-tender; bowel sounds normal; no masses,  no organomegaly Extremities: extremities normal, atraumatic, no cyanosis or edema and Homans sign is negative, no sign of DVT Neurologic: Grossly normal   Diagnostic Studies & Laboratory data:     Recent Radiology Findings:  INDICATION: PET positive posteromedial right upper lobe nodule compatible with lung cancer  EXAM: CT-GUIDED BIOPSY POSTERIOR RIGHT UPPER LOBE NODULE  MEDICATIONS: 1% lidocaine local  ANESTHESIA/SEDATION: 1.0 mg IV Versed; 50 mcg IV Fentanyl  Moderate Sedation Time:  14 minutes  The patient was continuously monitored during the procedure by the interventional radiology nurse under my direct supervision.  PROCEDURE: The procedure, risks, benefits, and alternatives were explained to the patient. Questions regarding the  procedure were encouraged and answered. The patient understands and consents to the procedure.  Previous imaging reviewed. Patient positioned prone. Noncontrast localization CT performed. The right upper lobe posteromedial nodule along the pleura was localized and marked for a paraspinous approach.  Under sterile conditions and local anesthesia, a 17 gauge coaxial guide needle was advanced to the nodule along the medial margin. Needle position confirmed with CT. Three 1 cm 18 gauge core biopsies obtained. These were placed in formalin. Samples were intact and non fragmented. Needle tract occluded with the bio sentry device. Postprocedure imaging demonstrates no hemorrhage, effusion or pneumothorax.  Patient tolerated the procedure well without complication. Vital sign monitoring by nursing staff during the procedure will continue as patient is in the special procedures unit for post procedure observation.  FINDINGS: The images document guide needle placement within the posteromedial right upper lobe nodule. Post biopsy images demonstrate no pneumothorax or hemorrhage.  COMPLICATIONS: None immediate.  IMPRESSION: Successful CT-guided core biopsy of the posterior right upper lobe nodule   Electronically Signed   By: Jerilynn Mages.  Shick M.D.   On: 08/10/2019 11:59   CLINICAL DATA:  Chest injury after fall yesterday.  EXAM: CT ANGIOGRAPHY CHEST WITH CONTRAST  TECHNIQUE: Multidetector CT imaging of the chest was performed using the standard protocol during bolus administration of intravenous contrast. Multiplanar CT image reconstructions and MIPs were obtained to evaluate the vascular anatomy.  CONTRAST:  69mL OMNIPAQUE IOHEXOL 350 MG/ML  SOLN  COMPARISON:  Radiograph of same day.  FINDINGS: Cardiovascular: Preferential opacification of the thoracic aorta. No evidence of thoracic aortic aneurysm or dissection. Normal heart size. No pericardial  effusion.  Mediastinum/Nodes: No enlarged mediastinal, hilar, or axillary lymph nodes. Thyroid gland, trachea, and esophagus demonstrate no significant findings.  Lungs/Pleura: No pneumothorax is noted. Left upper and lower lobe opacities are noted concerning for atelectasis or pneumonia. Minimal right posterior basilar subsegmental atelectasis is noted. 3.1 x 1.9 cm pleural based mass is noted medially in the right upper lobe highly concerning for malignancy. This is best seen on image number 46 of series 5. 14 x 12 mm nodule is noted in right upper lobe concerning for malignancy. Mild emphysematous disease is noted in both upper lobes.  Upper Abdomen: No acute abnormality.  Musculoskeletal: No chest wall abnormality. No acute or significant osseous findings.  Review of the MIP images confirms the above findings.  IMPRESSION: No evidence of thoracic aortic dissection or aneurysm.  3.1 x 1.9 cm pleural based mass is noted medially in the right upper lobe highly concerning for malignancy. Also noted is 1.4 x 1.2 cm nodule in the right upper lobe concerning for malignancy or metastatic disease. PET scan is recommended for further evaluation.  Left upper and lower lobe airspace opacities are noted concerning for pneumonia or atelectasis.  Emphysema (ICD10-J43.9).   Electronically Signed   By: Marijo Conception M.D.   On: 04/03/2019 11:36   NM PET Image Initial (PI) Skull Base To Thigh  Result Date: 04/18/2019 CLINICAL DATA:  Initial treatment strategy for right upper lobe mass. EXAM: NUCLEAR MEDICINE PET SKULL BASE TO THIGH TECHNIQUE: 6.2 mCi F-18 FDG was injected intravenously. Full-ring PET imaging was performed from the skull base to thigh after the radiotracer. CT data was obtained and used for attenuation correction and anatomic localization. Fasting blood glucose: 92 mg/dl COMPARISON:  CTA chest dated 04/03/2019 FINDINGS: Mediastinal blood pool activity: SUV max  2.1 Liver activity: SUV max NA NECK: No hypermetabolic cervical lymphadenopathy. Incidental CT findings: none CHEST: 2.3 x 2.9 cm medial right upper lobe nodule (series 8/image 119), max SUV 14.8, compatible with primary bronchogenic neoplasm. 13 mm nodule inferiorly in the right upper lobe adjacent to the minor fissure (series 8/image 30), max SUV 9.1, suspicious for metastasis. Additional 10 mm irregular nodule in the posterior right upper lobe adjacent to the minor fissure (series 8/image 29), max SUV 1.9, suspicious for metastasis. 6 mm nodule in the anterior left upper lobe (series 8/image 26), non FDG avid but beneath the size threshold for PET sensitivity. No hypermetabolic thoracic lymphadenopathy. Incidental CT findings: Mild centrilobular and paraseptal emphysematous changes, upper lung predominant. Linear scarring/atelectasis in the bilateral lower lobes, left greater than right. Mild coronary atherosclerosis of the LAD and right coronary artery. ABDOMEN/PELVIS: No hypermetabolic lymphadenopathy in the abdomen/pelvis. No abnormal hypermetabolism in the liver, spleen, pancreas, or adrenal glands. Incidental CT findings: Layering gallstones, without associated inflammatory changes. Atherosclerotic calcifications the abdominal aorta and branch vessels. Moderate fat containing supraumbilical ventral hernia (series 4/image 115). Small fat containing periumbilical hernia (series 4/image 126). SKELETON: Multiple right posterior rib fractures, involving the right posterior 5th through 11th ribs. Multiple medial right rib fractures at the costovertebral junction, with associated right transverse process fractures, at T6-9. Representative max SUV 5.8 along the right posterior 5th rib. No focal hypermetabolic activity to suggest skeletal metastases. Incidental CT findings: none IMPRESSION: 2.9 cm nodule in the medial right upper lobe, suspicious for primary bronchogenic  neoplasm. Two satellite nodule inferiorly in  the right upper lobe, suspicious for metastases. 6 mm nodule in the anterior left upper lobe, indeterminate. Multiple right posterior chest wall fractures, likely related to recent trauma. Electronically Signed   By: Julian Hy M.D.   On: 04/18/2019 15:04     I have independently reviewed the above radiology studies  and reviewed the findings with the patient.   Recent Lab Findings: Lab Results  Component Value Date   WBC 3.1 (L) 08/10/2019   HGB 12.2 08/10/2019   HCT 37.4 08/10/2019   PLT 242 08/10/2019   GLUCOSE 111 (H) 04/04/2019   ALT 11 04/04/2019   AST 13 (L) 04/04/2019   NA 134 (L) 04/04/2019   K 3.4 (L) 04/04/2019   CL 99 04/04/2019   CREATININE 0.63 04/04/2019   BUN 10 04/04/2019   CO2 26 04/04/2019   INR 1.1 08/10/2019   PFT's FEV1 1.8-1.6 76% DLCO 9.6 43%  Post-Test Comments: Good patient effort. The results of this test does not meets ATS standards; DLCO recieved error code IVC < 90% HHN given with 2.5mg  of Albuterol Hgb default value of 13.4 used. Only on complete DLCO done pt c/o being tired. The FEV1, FEV1/FVC ratio and FEF25-75% are reduced indicating airway obstruction. The airway resistance is normal. Lung volumes are within normal limits. Following administration of bronchodilators, there is no significant response. The reduced diffusing capacity indicates a severe loss of functional alveolar capillary surface. However, the diffusing capacity was not corrected for the patient's hemoglobin. Conclusions: Although there is airway obstruction and a diffusion defect suggesting emphysema, the absence of overinflation is inconsistent with that diagnosis. In view of the severity of the diffusion defect, studies with exercise would be helpful to evaluate the presence of hypoxemia. Pulmonary Function Diagnosis: Mild Obstructive Airways Disease Insignificant response to bronchodilator Severe Diffusion Defect  Before beginning test, pt rates dyspnea 0/10 and  fatigue 0/10. Her baseline VS at 1005 are as follows: BP 130/83, HR 120 (regular), Resp 20, spO2 98% on RA. Pt walks 9.5 laps in six minutes at a steady pace w/o complaint; this computes to 931 feet=284 meters. She rates dyspnea 0/10 and fatigue 0/10 during and after test. Her VS following test at 1011 are as follows: BP 115/81, HR 128 (regular), Resp 28, spO2 98% on RA.    PATH FINAL MICROSCOPIC DIAGNOSIS:   A. LUNG, RIGHT UPPER LOBE, NEEDLE CORE BIOPSY:  - Adenocarcinoma.   COMMENT:   There is likely sufficient tissue for additional studies (block A1). Dr.  Vic Ripper has reviewed the case.    GROSS DESCRIPTION:   The specimen is received in formalin and consists of 3 cores of tan-gray  soft tissue, ranging from 0.6 to 0.9 cm in length by 0.1 cm in diameter.  The specimen is entirely submitted in 2 cassettes. Craig Staggers 08/11/2019)     Final Diagnosis performed by Vicente Males, MD.    Assessment / Plan: Plan from FEB 2021   #1 patient with 3 cm right upper lobe mass highly suspicious for malignancy likely with at least pleural involvement of the posterior chest wall and 2 satellite lesions in the right upper lobe that are suggestive of malignancy-bronchoscopy done by the pulmonary service was unrevealing as far as diagnosis.-Pulmonary service as ordered CT-guided needle biopsy for February 22. #2 needs full evaluation of her respiratory status, screening spirometry was done by because of no Covid test full PFTs were not done-we will refer for full PFTs and obtain  6-minute walk test on her return At least clinical stage IIb malignancy with greater than 3 cm mass involving the right posterior chest wall but without bony destruction and 2 separate hypermetabolic satellite lesions in the same lobe. ( T3,N0,M)   We will plan to see the patient back quickly after her needle biopsy and completion of her functional status for making decision on treatment strategy.  NOW: #1 patient with biopsy-proven  adenocarcinoma 3 cm right upper lobe mass adjacent to the pleura with 2 satellite lesions in the upper lobe.  In addition there is a small lesion in the left upper lobe.  The patient quit smoking in March.  I discussed with her proceeding with right upper lobectomy with node dissection robotically.  She does have significant diffusion deficit but was able to do 6-minute walk test today maintaining her saturations. The risks and options of surgery have been discussed with her in detail Because of the delay in her original diagnosis and the PET scan-repeat PET scan will be done to evaluate for any other metastatic disease that may have developed.   The risk of surgery including bleeding, respiratory distress, pulmonary embolus, prolonged air leak need for blood transfusion, myocardial infarction have been discussed with the patient detail.  The postoperative expectations, tubes drains lines potential length of hospital stay have also been discussed with her.  PET scan scheduled for June 11, will schedule MRI of the brain prior to surgery , surgery is scheduled for June 16    Grace Isaac MD      [address] [place] [address] Office [phone]     08/24/2019 11:45 AM

## 2019-08-24 NOTE — Progress Notes (Signed)
Six minute walk test performed per Dr. Everrett Coombe order. Before beginning test, pt rates dyspnea 0/10 and fatigue 0/10. Her baseline VS at 1005 are as follows: BP 130/83, HR 120 (regular), Resp 20, spO2 98% on RA. Pt walks 9.5 laps in six minutes at a steady pace w/o complaint; this computes to 931 feet=284 meters. She rates dyspnea 0/10 and fatigue 0/10 during and after test. Her VS following test at 1011 are as follows: BP 115/81, HR 128 (regular), Resp 28, spO2 98% on RA.

## 2019-09-01 ENCOUNTER — Other Ambulatory Visit: Payer: Self-pay

## 2019-09-01 ENCOUNTER — Ambulatory Visit (HOSPITAL_COMMUNITY)
Admission: RE | Admit: 2019-09-01 | Discharge: 2019-09-01 | Disposition: A | Discharge status: Home / Self Care | Payer: Medicaid Other | Source: Ambulatory Visit | Attending: Cardiothoracic Surgery | Admitting: Cardiothoracic Surgery

## 2019-09-01 DIAGNOSIS — D381 Neoplasm of uncertain behavior of trachea, bronchus and lung: Secondary | ICD-10-CM | POA: Insufficient documentation

## 2019-09-01 LAB — GLUCOSE, CAPILLARY: Glucose-Capillary: 101 mg/dL — ABNORMAL HIGH (ref 70–99)

## 2019-09-01 IMAGING — CT NM PET TUM IMG RESTAG (PS) SKULL BASE T - THIGH
7 series · 25 of 25 positions shown · non-contrast
Comparison: [DATE] PET-CT.

CLINICAL DATA: Subsequent treatment strategy for right upper lobe
lung adenocarcinoma recently diagnosed on [DATE] biopsy.

EXAM:
NUCLEAR MEDICINE PET SKULL BASE TO THIGH
TECHNIQUE: 6.6 mCi F-18 FDG was injected intravenously. Full-ring PET imaging
was performed from the skull base to thigh after the radiotracer. CT
data was obtained and used for attenuation correction and anatomic
localization.
Fasting blood glucose: 101 mg/dl

[Series 3: pet sk_thigh ac · axial · 5.0mm · 4.07mm/px · z∈[-874,-118]mm · 5 of 190 slices shown]
[im 1/190]
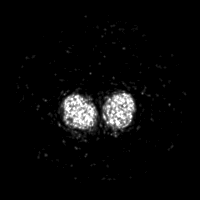
[im 48/190]
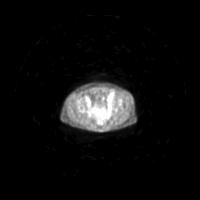
[im 95/190]
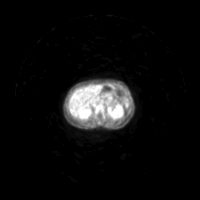
[im 142/190]
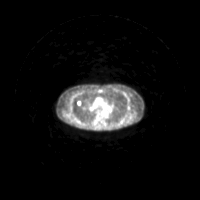
[im 190/190]
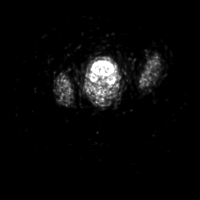

[Series 4: ct sk_thigh 5.0 b31f · axial · 5.0mm · 0.98mm/px · z∈[-874,-118]mm · 5 of 190 slices shown]
[im 1/190]
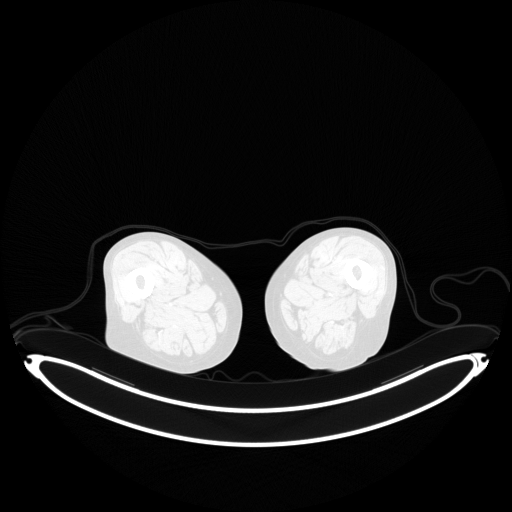
[im 48/190]
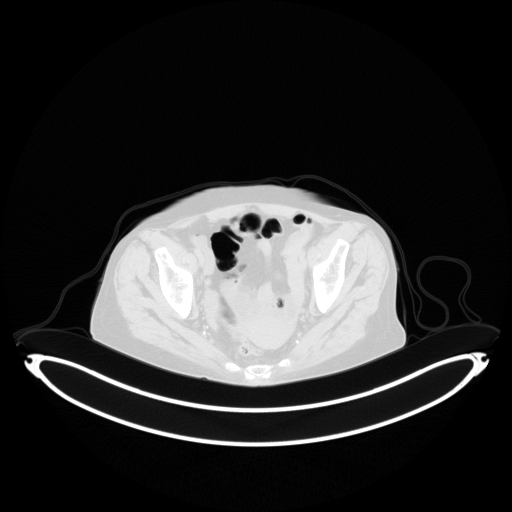
[im 95/190]
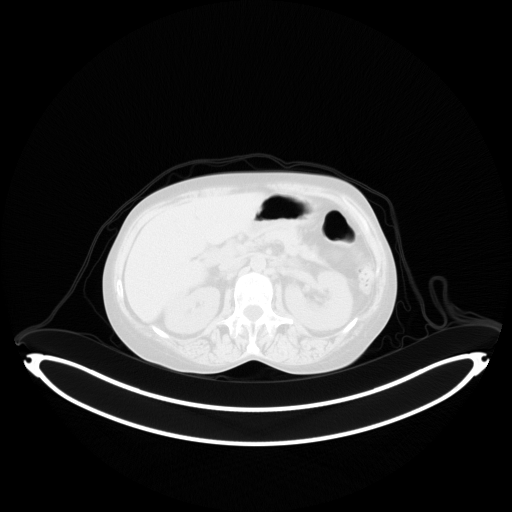
[im 142/190]
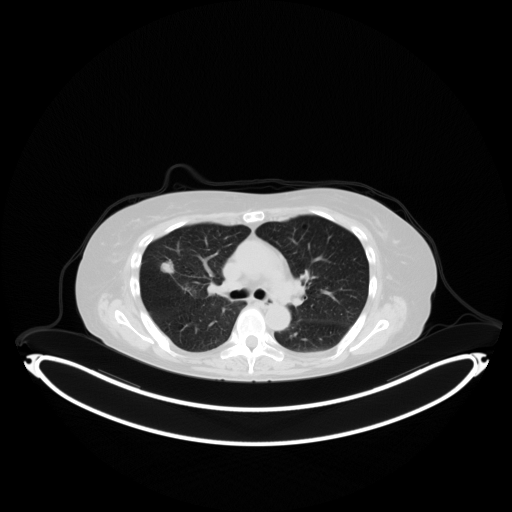
[im 190/190]
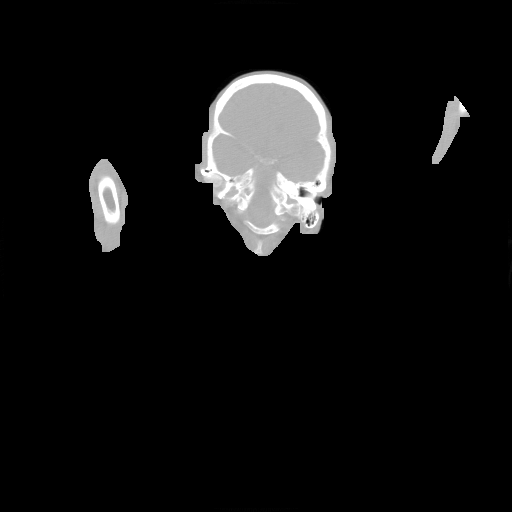

[Series 5: pet sk_thigh nac · axial · 5.0mm · 4.07mm/px · z∈[-874,-118]mm · 5 of 190 slices shown]
[im 1/190]
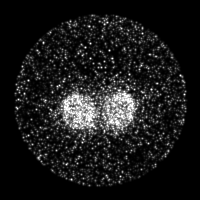
[im 48/190]
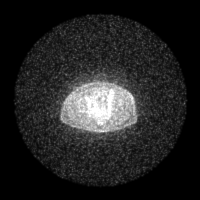
[im 95/190]
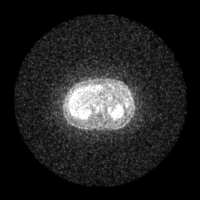
[im 142/190]
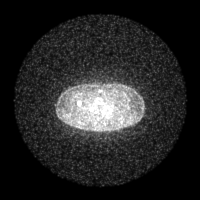
[im 190/190]
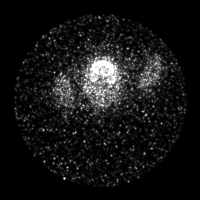

[Series 8: ct sk_thigh 5.0 (id) lung_bone · axial · 5.0mm · 0.56mm/px · z∈[-461,-201]mm · 2 of 66 slices shown]
[im 1/66  bone]
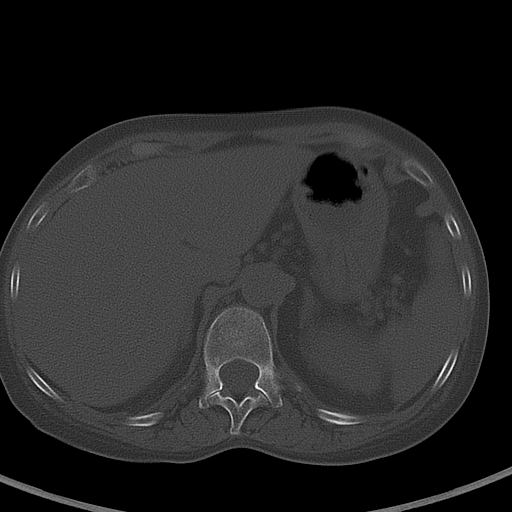
[im 66/66  bone]
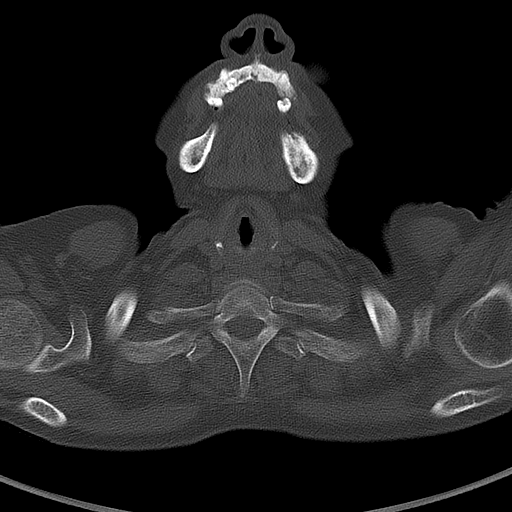

[Series 603: mip range 3 · coronal · 1.68mm/px · 1 of 32 slices shown]
[im 1/32]
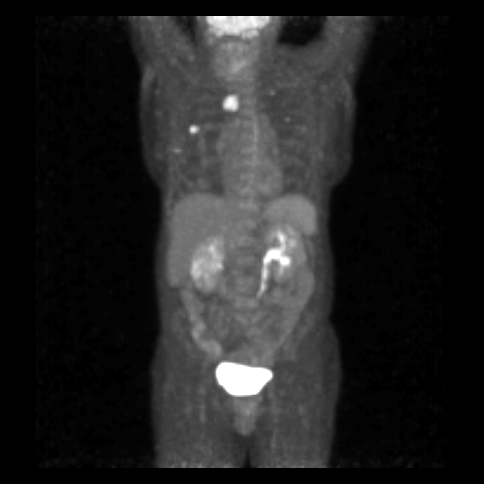

[Series 604: range-ct sk_thigh 5.0 (id)<alpha range> · 2 of 73 slices shown (1 of 2)]
[im 1/73]
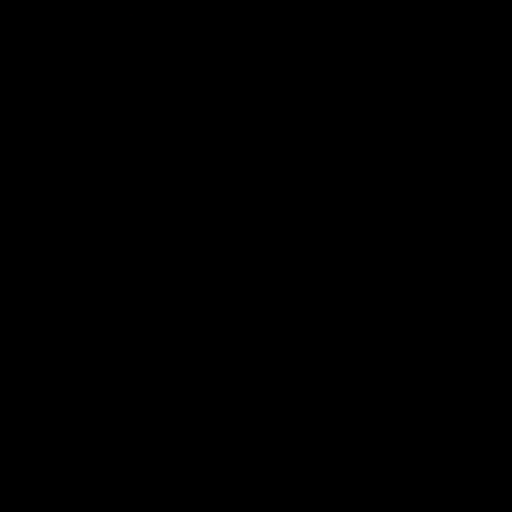
[im 73/73]
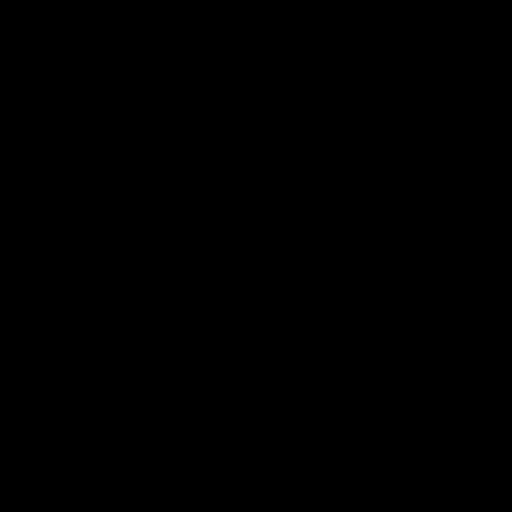

[Series 605: range-ct sk_thigh 5.0 (id)<alpha range> · 5 of 185 slices shown (2 of 2)]
[im 1/185]
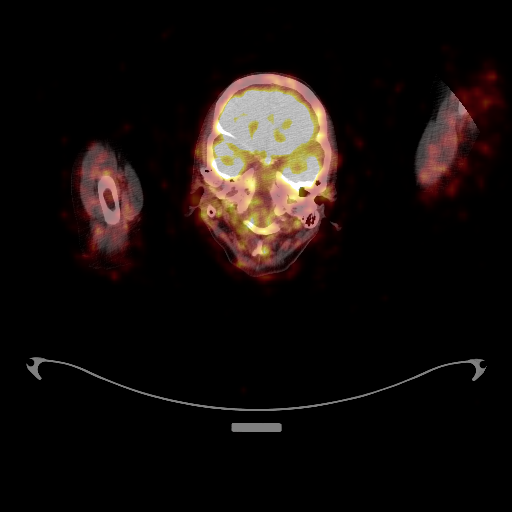
[im 47/185]
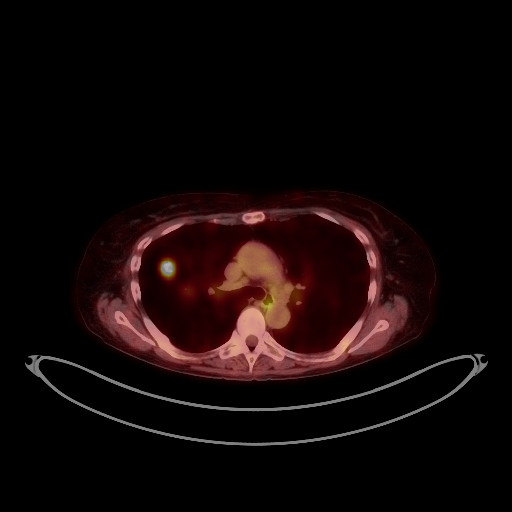
[im 93/185]
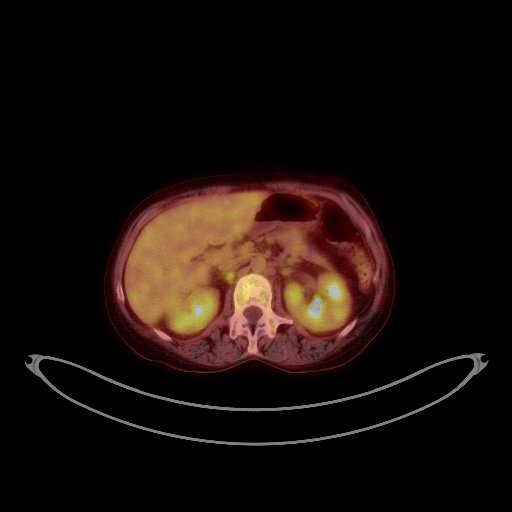
[im 139/185]
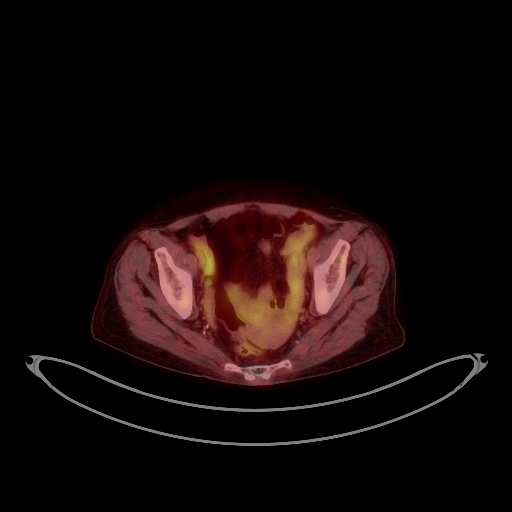
[im 185/185]
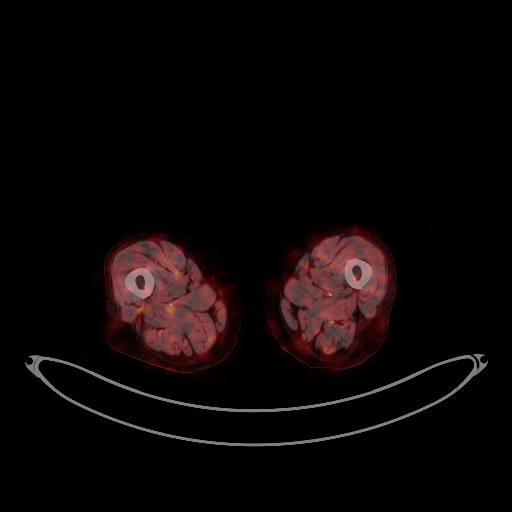

[25 of 25 positions shown; findings below may reference images not displayed]

FINDINGS: Mediastinal blood pool activity: SUV max

Liver activity: SUV max NA

NECK: No hypermetabolic lymph nodes in the neck.

Incidental CT findings: Mucoperiosteal thickening versus mucous
retention cyst in the dependent left maxillary sinus.

CHEST:

Spiculated hypermetabolic 3.2 x 2.7 cm medial right upper lobe
pleural-based lung mass with max SUV 14.5 (series 8/image 16),
previously 3.2 x 2.7 cm on [DATE] PET-CT using similar
measurement technique with max SUV 14.8, stable.

Two irregular solid hypermetabolic basilar right upper lobe
pulmonary nodules, largest 1.4 cm with max SUV 13.4 (series 8/image
28), previously 1.4 cm with max SUV 9.1, stable in size and
increased in metabolism.

Two subsolid left upper lobe pulmonary nodules with borderline mild
hypermetabolism, largest 1.2 cm anteriorly with max SUV 2.4 (series
8/image 23), previously 0.9 cm with max SUV 0.9, increased in size
and metabolism.

New mild hypermetabolism associated with normal size right
paratracheal lymph nodes, for example measuring 0.6 cm with max SUV
3.3 (series 4/image 46). No enlarged or otherwise hypermetabolic
mediastinal lymph nodes.

No enlarged or hypermetabolic axillary or hilar lymph nodes.

Incidental CT findings: Moderate centrilobular emphysema. Basilar
left lower lobe 0.7 cm pulmonary nodule (series 8/image 54), below
PET resolution and without definite hypermetabolism, previously
obscured by atelectasis. Coronary atherosclerosis.

ABDOMEN/PELVIS:

Newly hypermetabolic nonenlarged 0.5 cm high right retrocaval
retroperitoneal lymph node with max SUV 3.8 (series 4/image 96). No
additional hypermetabolic or enlarged abdominal lymph nodes.

No abnormal hypermetabolic activity within the liver, pancreas,
adrenal glands, or spleen. No hypermetabolic lymph nodes in the
pelvis.

Incidental CT findings: Cholelithiasis. Atherosclerotic
nonaneurysmal abdominal aorta. Stable moderate umbilical and
supraumbilical fat containing ventral abdominal hernias.

SKELETON: No focal hypermetabolic activity to suggest skeletal
metastasis.

Incidental CT findings: none
IMPRESSION: 1. Hypermetabolic spiculated pleural-based medial right upper lobe
3.2 cm lung mass, compatible with known primary bronchogenic
carcinoma, stable since [DATE] PET-CT.
2. Two separate hypermetabolic right upper lobe pulmonary
metastases, increased in metabolism since [DATE] PET-CT.
3. Two hypermetabolic subsolid left upper lobe pulmonary nodules,
increased in size and metabolism since [DATE] PET-CT, most
compatible with contralateral pulmonary metastases.
4. New mild hypermetabolism within nonenlarged right paratracheal
lymph nodes, cannot exclude nodal metastases.
5. Newly hypermetabolic nonenlarged high right retroperitoneal
retrocaval lymph node suspicious for nodal metastasis.
6. Chronic findings include: Aortic Atherosclerosis ([S9]-[S9])
and Emphysema ([S9]-[S9]). Coronary atherosclerosis.
Cholelithiasis.

## 2019-09-01 MED ORDER — FLUDEOXYGLUCOSE F - 18 (FDG) INJECTION
6.6100 | Freq: Once | INTRAVENOUS | Status: AC
  Administered 2019-09-01: 6.61 via INTRAVENOUS

## 2019-09-01 NOTE — Pre-Procedure Instructions (Signed)
Your procedure is scheduled on Wednesday, September 06 2019 from 08:30 AM- 12:07 PM.  Report to [facility] Main Entrance "A" at 06:30 A.M., and check in at the Admitting office.  Call this number if you have problems the morning of surgery:  [phone]  Call [phone] if you have any questions prior to your surgery date Monday-Friday 8am-4pm.    Remember:  Do not eat or drink after midnight the night before your surgery.   Take these medicines the morning of surgery with A SIP OF WATER: NONE  As of today, STOP taking any Aspirin (unless otherwise instructed by your surgeon) and Aspirin containing products, Aleve, Naproxen, Ibuprofen, Motrin, Advil, Goody's, BC's, all herbal medications, fish oil, and all vitamins.          The Morning of Surgery:            Do not wear jewelry, make up, or nail polish.            Do not wear lotions, powders, perfumes, or deodorant.            Do not shave 48 hours prior to surgery.              Do not bring valuables to the hospital.            [facility] is not responsible for any belongings or valuables.  Do NOT Smoke (Tobacco/Vapping) or drink Alcohol 24 hours prior to your procedure.  If you use a CPAP at night, you may bring all equipment for your overnight stay.   Contacts, glasses, dentures or bridgework may not be worn into surgery.      For patients admitted to the hospital, discharge time will be determined by your treatment team.   Patients discharged the day of surgery will not be allowed to drive home, and someone needs to stay with them for 24 hours.    Special instructions:   [place]- Preparing For Surgery  Before surgery, you can play an important role. Because skin is not sterile, your skin needs to be as free of germs as possible. You can reduce the number of germs on your skin by washing with CHG (chlorahexidine gluconate) Soap before surgery.  CHG is an antiseptic cleaner which kills germs and bonds with the skin to  continue killing germs even after washing.    Oral Hygiene is also important to reduce your risk of infection.  Remember - BRUSH YOUR TEETH THE MORNING OF SURGERY WITH YOUR REGULAR TOOTHPASTE  Please do not use if you have an allergy to CHG or antibacterial soaps. If your skin becomes reddened/irritated stop using the CHG.  Do not shave (including legs and underarms) for at least 48 hours prior to first CHG shower. It is OK to shave your face.  Please follow these instructions carefully.   1. Shower the NIGHT BEFORE SURGERY and the MORNING OF SURGERY with CHG Soap.   2. If you chose to wash your hair, wash your hair first as usual with your normal shampoo.  3. After you shampoo, rinse your hair and body thoroughly to remove the shampoo.  4. Use CHG as you would any other liquid soap. You can apply CHG directly to the skin and wash gently with a scrungie or a clean washcloth.   5. Apply the CHG Soap to your body ONLY FROM THE NECK DOWN.  Do not use on open wounds or open sores. Avoid contact with your eyes, ears,  mouth and genitals (private parts). Wash Face and genitals (private parts)  with your normal soap.   6. Wash thoroughly, paying special attention to the area where your surgery will be performed.  7. Thoroughly rinse your body with warm water from the neck down.  8. DO NOT shower/wash with your normal soap after using and rinsing off the CHG Soap.  9. Pat yourself dry with a CLEAN TOWEL.  10. Wear CLEAN PAJAMAS to bed the night before surgery, wear comfortable clothes the morning of surgery  11. Place CLEAN SHEETS on your bed the night of your first shower and DO NOT SLEEP WITH PETS.   Day of Surgery: Shower with CHG Soap.   Do not apply any deodorants/lotions.  Please wear clean clothes to the hospital/surgery center.   Remember to brush your teeth WITH YOUR REGULAR TOOTHPASTE.   Please read over the following fact sheets that you were given.

## 2019-09-04 ENCOUNTER — Other Ambulatory Visit: Payer: Self-pay | Admitting: *Deleted

## 2019-09-04 ENCOUNTER — Encounter: Payer: Self-pay | Admitting: Cardiothoracic Surgery

## 2019-09-04 ENCOUNTER — Inpatient Hospital Stay (HOSPITAL_COMMUNITY)
Admission: RE | Admit: 2019-09-04 | Discharge: 2019-09-04 | Disposition: A | Discharge status: Home / Self Care | Payer: Self-pay | Source: Ambulatory Visit

## 2019-09-04 ENCOUNTER — Other Ambulatory Visit: Payer: Self-pay

## 2019-09-04 ENCOUNTER — Ambulatory Visit
Admission: RE | Admit: 2019-09-04 | Discharge: 2019-09-04 | Disposition: A | Discharge status: Home / Self Care | Payer: Self-pay | Source: Ambulatory Visit | Attending: Cardiothoracic Surgery | Admitting: Cardiothoracic Surgery

## 2019-09-04 ENCOUNTER — Ambulatory Visit (INDEPENDENT_AMBULATORY_CARE_PROVIDER_SITE_OTHER): Payer: Self-pay | Admitting: Cardiothoracic Surgery

## 2019-09-04 ENCOUNTER — Encounter: Payer: Self-pay | Admitting: *Deleted

## 2019-09-04 ENCOUNTER — Other Ambulatory Visit (HOSPITAL_COMMUNITY): Payer: Self-pay

## 2019-09-04 VITALS — BP 128/82 | HR 92 | Temp 97.5°F | Resp 16 | Ht 66.0 in | Wt 112.0 lb

## 2019-09-04 DIAGNOSIS — D381 Neoplasm of uncertain behavior of trachea, bronchus and lung: Secondary | ICD-10-CM

## 2019-09-04 DIAGNOSIS — R918 Other nonspecific abnormal finding of lung field: Secondary | ICD-10-CM

## 2019-09-04 DIAGNOSIS — G3281 Cerebellar ataxia in diseases classified elsewhere: Secondary | ICD-10-CM

## 2019-09-04 DIAGNOSIS — C3411 Malignant neoplasm of upper lobe, right bronchus or lung: Secondary | ICD-10-CM

## 2019-09-04 IMAGING — MR MR HEAD WO/W CM
13 series · 48 of 48 positions shown · IV contrast (multihance)
Comparison: Head CT [DATE]

CLINICAL DATA: Ataxia.  Lung cancer.

EXAM:
MRI HEAD WITHOUT AND WITH CONTRAST
TECHNIQUE: Multiplanar, multiecho pulse sequences of the brain and surrounding
structures were obtained without and with intravenous contrast.
CONTRAST:  11mL MULTIHANCE GADOBENATE DIMEGLUMINE 529 MG/ML IV SOLN

[Series 5: T1 · sagittal · 4.0mm · 0.75mm/px · 1 of 31 slices shown (1 of 3)]
[im 1/31]
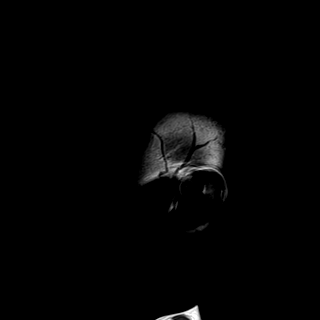

[Series 6: DWI · axial · 3.0mm · 1.44mm/px · z∈[-52,+82]mm · 5 of 84 slices shown (1 of 4)]
[im 1/84]
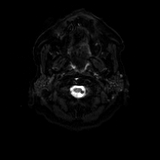
[im 21/84]
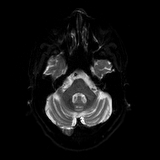
[im 42/84]
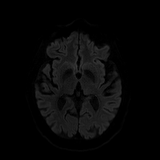
[im 63/84]
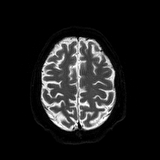
[im 84/84]
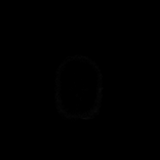

[Series 7: DWI · axial · 3.0mm · 1.44mm/px · z∈[-52,+82]mm · 2 of 41 slices shown (2 of 4)]
[im 1/41]
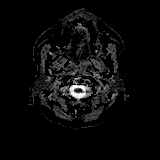
[im 41/41]
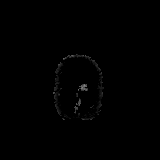

[Series 8: DWI · coronal · 5.0mm · 1.44mm/px · 4 of 60 slices shown (3 of 4)]
[im 1/60]
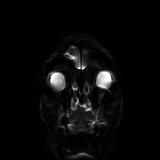
[im 20/60]
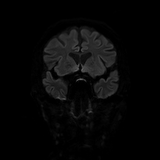
[im 40/60]
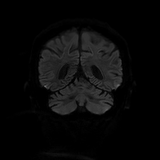
[im 60/60]
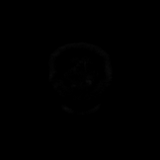

[Series 9: DWI · coronal · 5.0mm · 1.44mm/px · 2 of 30 slices shown (4 of 4)]
[im 1/30]
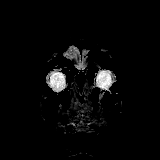
[im 30/30]
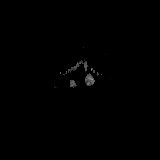

[Series 10: T2 · axial · 4.0mm · 0.36mm/px · z∈[-53,+81]mm · 2 of 27 slices shown]
[im 1/27]
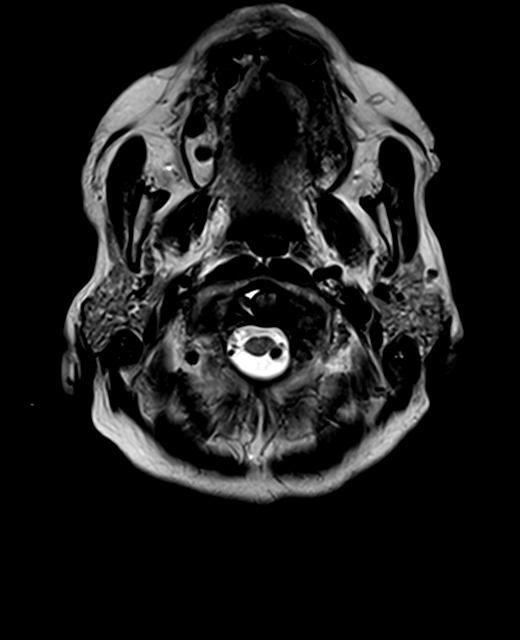
[im 27/27]
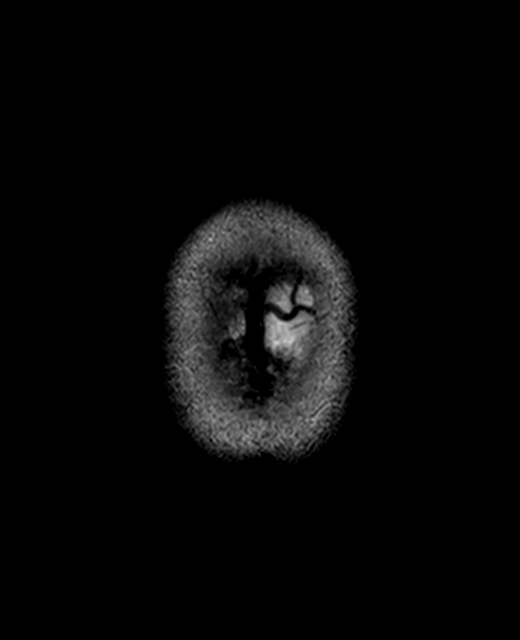

[Series 11: FLAIR · axial · 3.0mm · 0.72mm/px · z∈[-61,+88]mm · 2 of 26 slices shown]
[im 1/26]
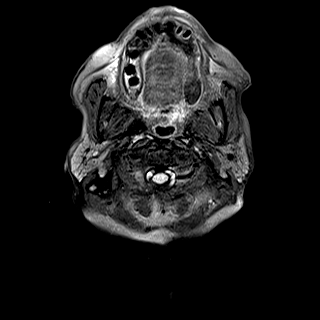
[im 26/26]
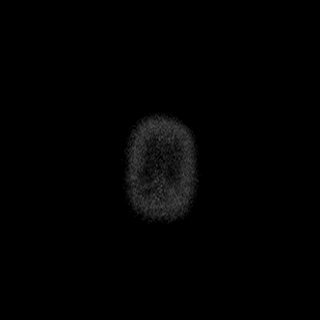

[Series 13: swi_images · axial · 1.5mm · 0.90mm/px · z∈[-56,+85]mm · 6 of 96 slices shown]
[im 1/96]
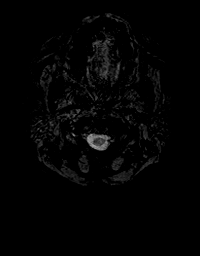
[im 20/96]
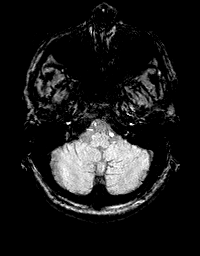
[im 39/96]
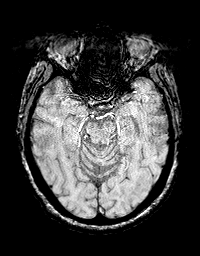
[im 58/96]
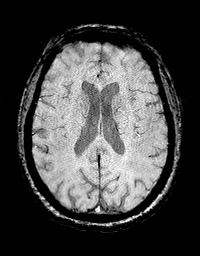
[im 77/96]
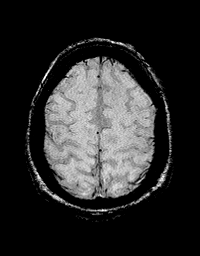
[im 96/96]
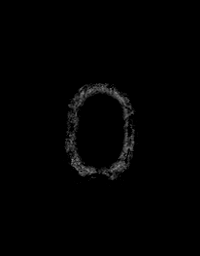

[Series 14: T1 · axial · 1.0mm · 0.94mm/px · z∈[-76,+80]mm · 9 of 160 slices shown (2 of 3)]
[im 1/160]
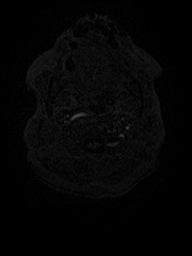
[im 20/160]
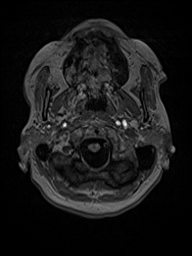
[im 40/160]
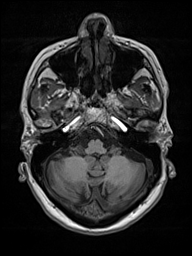
[im 60/160]
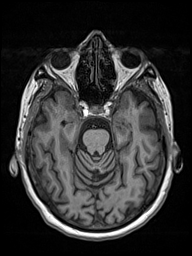
[im 80/160]
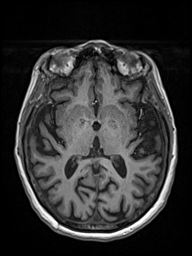
[im 100/160]
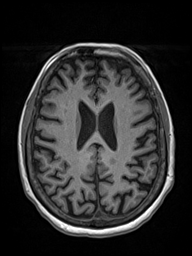
[im 120/160]
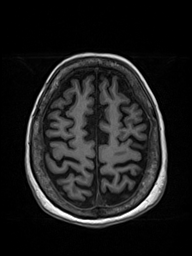
[im 140/160]
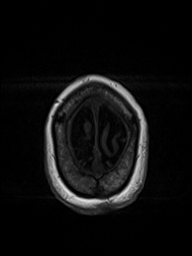
[im 160/160]
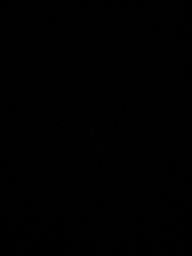

[Series 15: T2 post-contrast · coronal · 4.0mm · 0.36mm/px · 2 of 33 slices shown]
[im 1/33]
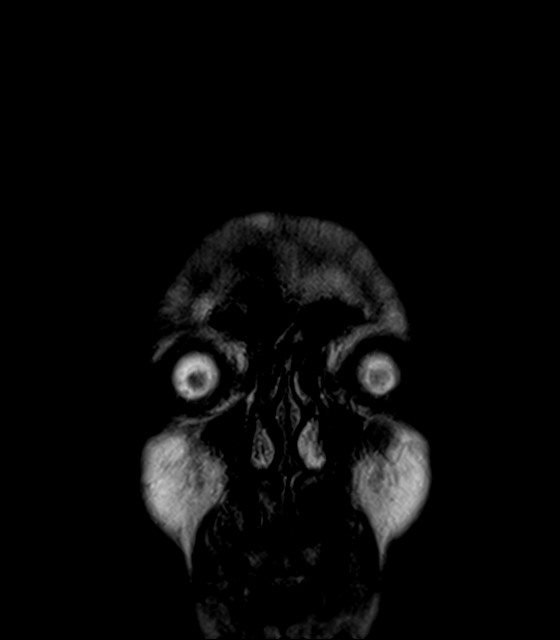
[im 33/33]
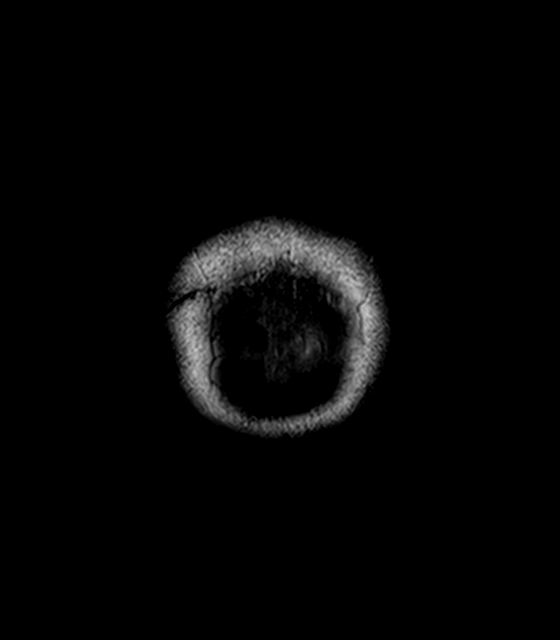

[Series 16: T1 · axial · 1.0mm · 0.94mm/px · z∈[-76,+80]mm · 9 of 160 slices shown (3 of 3)]
[im 1/160]
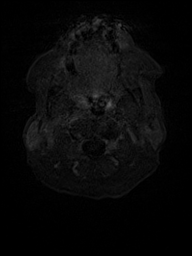
[im 20/160]
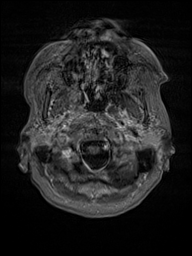
[im 40/160]
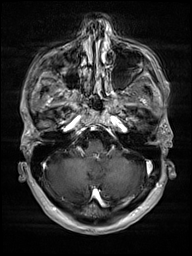
[im 60/160]
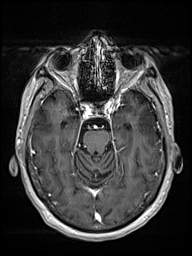
[im 80/160]
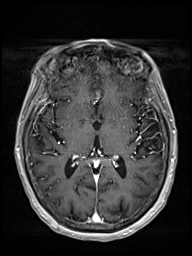
[im 100/160]
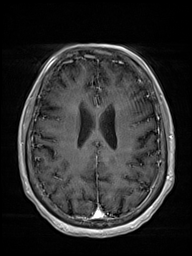
[im 120/160]
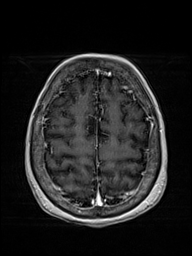
[im 140/160]
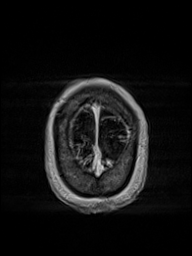
[im 160/160]
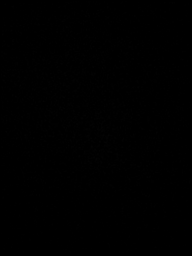

[Series 17: T1 post-contrast · coronal · 4.0mm · 0.72mm/px · 2 of 33 slices shown (1 of 2)]
[im 1/33]
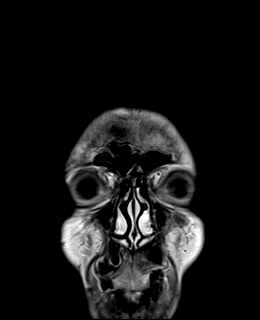
[im 33/33]
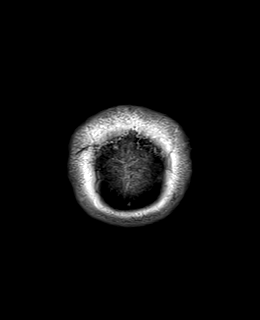

[Series 18: T1 post-contrast · sagittal · 4.0mm · 0.75mm/px · 2 of 31 slices shown (2 of 2)]
[im 1/31]
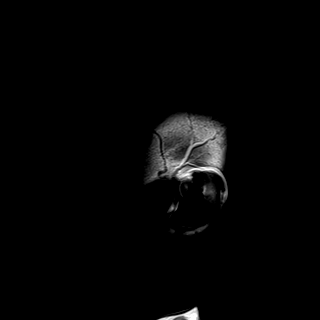
[im 31/31]
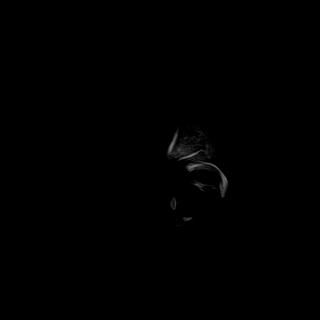

[48 of 48 positions shown; findings below may reference images not displayed]

FINDINGS: The study is intermittently motion degraded.

Brain: There is no evidence of acute infarct, intracranial
hemorrhage, mass, midline shift, or extra-axial fluid collection.
There is a chronic infarct centrally in the pons. No significant
cerebral white matter disease is evident. There is mild cerebral and
cerebellar atrophy.

No definite enhancing brain lesions are identified although mild to
moderate motion artifact on the axial T1 postcontrast sequence
limits sensitivity for detection of very small lesions. A 1 mm focus
of enhancement along the parasagittal left frontal lobe on the
coronal sequence (series 17, image 26) is not confirmed on the other
sequences and may be vascular.

Vascular: Major intracranial vascular flow voids are preserved.

Skull and upper cervical spine: No suspicious marrow lesion.
Moderate cervical disc degeneration.

Sinuses/Orbits: Unremarkable orbits. Mild left maxillary and
posterior left ethmoid sinus mucosal thickening. Trace bilateral
mastoid fluid.

Other: None.
IMPRESSION: 1. Motion degraded examination without acute infarct or convincing
intracranial metastases. A 1 mm enhancing focus along the
parasagittal left frontal lobe on a single sequence may be vascular.
Attention on follow-up studies to exclude early metastatic disease.
2. Chronic pontine infarct.

## 2019-09-04 MED ORDER — GADOBENATE DIMEGLUMINE 529 MG/ML IV SOLN
11.0000 mL | Freq: Once | INTRAVENOUS | Status: AC | PRN
  Administered 2019-09-04: 11 mL via INTRAVENOUS

## 2019-09-04 NOTE — Progress Notes (Signed)
[address]       [place] [address]             [phone]                    [name] Hudson Medical Record #[id number] Date of Birth: 27-May-1960  Referring: Candee Furbish, MD Primary Care: System, Pcp Not In Primary Cardiologist: No primary care provider on file.  Chief Complaint:    Chief Complaint  Patient presents with  . Lung Cancer    RULobe...further discussion after PET and MRI    History of Present Illness:    Brenda [name] 59 y.o. female was seen in the office in February, at that time her work-up was incomplete.   The patient was referred by pulmonary service.Pulmonary function studies are incomplete-diffusion capacity was not done because of lack of Covid test.  Bronchoscopy of the pulmonary service was done and biopsies obtained but were nondiagnostic . She was scheduled for  needle directed CT biopsy of posterior left upper lobe mass involving the pleura and/or chest wall on February 22 but patient did not show for bx   The patient is a daily smoker and although has decreased some continues to smoke .  She has underlying lung disease both by CT and functionally, she notes she is able to walk around her house without significant shortness of breath but is unable to climb a flight of stairs without difficulty.  On January 11 the patient notes that she fell striking her right side on the arm of her sofa, following this she had increasing right pleuritic rib pain and noted "ribs moving ".  She was admitted to the hospital with acute respiratory failure requiring nonrebreather oxygen CT scan of the chest was done that demonstrated 3 cm pleural-based right upper lobe posterior mass in addition to the 2 smaller lesions in the right upper lobe, all 3 hypermetabolic.  In addition she had a left lower lobe infiltrative mass and was treated for pneumonia with IV antibiotics  Bronchoscopy was performed on January 12 with Dr. Alessandra Bevels definitive  diagnosis was made from biopsies of the right upper lobe.  Since originally seen multiple contacts with the patient to get her work-up completed has been attempted ultimately she had full PFTs which demonstrated severe diffusion deficit 40%, FEV1 1.6-1.8.  Subsequent CT scan suggest a new lesion in the left lung.  A CT-guided needle biopsy of the 3 cm pleural-based mass was done confirming malignancy  Patient notes she quit smoking in March  Since last seen to consider surgery very follow-up PET scan and MRI of the brain were resulted  yesterday  and today.  Current Activity/ Functional Status:  Patient is independent with mobility/ambulation, transfers, ADL's, IADL's.   Zubrod Score: At the time of surgery this patient's most appropriate activity status/level should be described as: []     0    Normal activity, no symptoms [x]     1    Restricted in physical strenuous activity but ambulatory, able to do out light work []     2    Ambulatory and capable of self care, unable to do work activities, up and about               >50 % of waking hours                              []   3    Only limited self care, in bed greater than 50% of waking hours []     4    Completely disabled, no self care, confined to bed or chair []     5    Moribund   Past Medical History:  Diagnosis Date  . Depression   . Mental disorder     Past Surgical History:  Procedure Laterality Date  . TUBAL LIGATION    . VIDEO BRONCHOSCOPY WITH ENDOBRONCHIAL NAVIGATION N/A 04/04/2019   Procedure: VIDEO BRONCHOSCOPY WITH ENDOBRONCHIAL NAVIGATION;  Surgeon: Garner Nash, DO;  Location: [facility];  Service: Thoracic;  Laterality: N/A;  . VIDEO BRONCHOSCOPY WITH ENDOBRONCHIAL ULTRASOUND N/A 04/04/2019   Procedure: VIDEO BRONCHOSCOPY WITH ENDOBRONCHIAL ULTRASOUND;  Surgeon: Garner Nash, DO;  Location: [facility];  Service: Thoracic;  Laterality: N/A;    Family history is significant for her mother.  Died with colon cancer, 1  brother is alive history of unknown malignancy treated with radiation chemo  Social History   Tobacco Use  Smoking Status Current Every Day Smoker  . Packs/day: 0.50  . Years: 30.00  . Pack years: 15.00  . Types: Cigarettes  Smokeless Tobacco Never Used    Social History   Substance and Sexual Activity  Alcohol Use Yes     Allergies  Allergen Reactions  . Aspirin Adult Low [Aspirin]     Stomach upset    Current Outpatient Medications  Medication Sig Dispense Refill  . calcium carbonate (OSCAL) 1500 (600 Ca) MG TABS tablet Take 600 mg by mouth daily.    Marland Kitchen guaiFENesin (MUCINEX) 600 MG 12 hr tablet Take 1 tablet (600 mg total) by mouth 2 (two) times daily as needed for cough or to loosen phlegm. (Patient not taking: Reported on 08/24/2019)    . oxyCODONE-acetaminophen (PERCOCET/ROXICET) 5-325 MG tablet Take 1 tablet by mouth every 6 (six) hours as needed for severe pain. (Patient not taking: Reported on 08/24/2019) 30 tablet 0   No current facility-administered medications for this visit.       PHYSICAL EXAMINATION: BP 128/82 (BP Location: Left Arm, Patient Position: Sitting, Cuff Size: Normal)   Pulse 92   Temp (!) 97.5 F (36.4 C)   Resp 16   Ht 5\' 6"  (1.676 m)   Wt 112 lb (50.8 kg)   LMP 01/19/2011   SpO2 (!) 74% Comment: NAIL POLISH  BMI 18.08 kg/m  General appearance: alert and cooperative Head: Normocephalic, without obvious abnormality, atraumatic Neck: no adenopathy, no carotid bruit, no JVD, supple, symmetrical, trachea midline and thyroid not enlarged, symmetric, no tenderness/mass/nodules Lymph nodes: Cervical, supraclavicular, and axillary nodes normal. Resp: clear to auscultation bilaterally Cardio: regular rate and rhythm, S1, S2 normal, no murmur, click, rub or gallop Extremities: extremities normal, atraumatic, no cyanosis or edema Neurologic: Grossly normal   Diagnostic Studies & Laboratory data:     Recent Radiology Findings:  INDICATION: PET  positive posteromedial right upper lobe nodule compatible with lung cancer  EXAM: CT-GUIDED BIOPSY POSTERIOR RIGHT UPPER LOBE NODULE  MEDICATIONS: 1% lidocaine local  ANESTHESIA/SEDATION: 1.0 mg IV Versed; 50 mcg IV Fentanyl  Moderate Sedation Time:  14 minutes  The patient was continuously monitored during the procedure by the interventional radiology nurse under my direct supervision.  PROCEDURE: The procedure, risks, benefits, and alternatives were explained to the patient. Questions regarding the procedure were encouraged and answered. The patient understands and consents to the procedure.  Previous imaging reviewed. Patient positioned prone. Noncontrast localization CT performed. The  right upper lobe posteromedial nodule along the pleura was localized and marked for a paraspinous approach.  Under sterile conditions and local anesthesia, a 17 gauge coaxial guide needle was advanced to the nodule along the medial margin. Needle position confirmed with CT. Three 1 cm 18 gauge core biopsies obtained. These were placed in formalin. Samples were intact and non fragmented. Needle tract occluded with the bio sentry device. Postprocedure imaging demonstrates no hemorrhage, effusion or pneumothorax.  Patient tolerated the procedure well without complication. Vital sign monitoring by nursing staff during the procedure will continue as patient is in the special procedures unit for post procedure observation.  FINDINGS: The images document guide needle placement within the posteromedial right upper lobe nodule. Post biopsy images demonstrate no pneumothorax or hemorrhage.  COMPLICATIONS: None immediate.  IMPRESSION: Successful CT-guided core biopsy of the posterior right upper lobe nodule   Electronically Signed   By: Jerilynn Mages.  Shick M.D.   On: 08/10/2019 11:59   CLINICAL DATA:  Chest injury after fall yesterday.  EXAM: CT ANGIOGRAPHY CHEST WITH  CONTRAST  TECHNIQUE: Multidetector CT imaging of the chest was performed using the standard protocol during bolus administration of intravenous contrast. Multiplanar CT image reconstructions and MIPs were obtained to evaluate the vascular anatomy.  CONTRAST:  82mL OMNIPAQUE IOHEXOL 350 MG/ML SOLN  COMPARISON:  Radiograph of same day.  FINDINGS: Cardiovascular: Preferential opacification of the thoracic aorta. No evidence of thoracic aortic aneurysm or dissection. Normal heart size. No pericardial effusion.  Mediastinum/Nodes: No enlarged mediastinal, hilar, or axillary lymph nodes. Thyroid gland, trachea, and esophagus demonstrate no significant findings.  Lungs/Pleura: No pneumothorax is noted. Left upper and lower lobe opacities are noted concerning for atelectasis or pneumonia. Minimal right posterior basilar subsegmental atelectasis is noted. 3.1 x 1.9 cm pleural based mass is noted medially in the right upper lobe highly concerning for malignancy. This is best seen on image number 46 of series 5. 14 x 12 mm nodule is noted in right upper lobe concerning for malignancy. Mild emphysematous disease is noted in both upper lobes.  Upper Abdomen: No acute abnormality.  Musculoskeletal: No chest wall abnormality. No acute or significant osseous findings.  Review of the MIP images confirms the above findings.  IMPRESSION: No evidence of thoracic aortic dissection or aneurysm.  3.1 x 1.9 cm pleural based mass is noted medially in the right upper lobe highly concerning for malignancy. Also noted is 1.4 x 1.2 cm nodule in the right upper lobe concerning for malignancy or metastatic disease. PET scan is recommended for further evaluation.  Left upper and lower lobe airspace opacities are noted concerning for pneumonia or atelectasis.  Emphysema (ICD10-J43.9).   Electronically Signed   By: Marijo Conception M.D.   On: 04/03/2019 11:36   NM PET Image Initial  (PI) Skull Base To Thigh  Result Date: 04/18/2019 CLINICAL DATA:  Initial treatment strategy for right upper lobe mass. EXAM: NUCLEAR MEDICINE PET SKULL BASE TO THIGH TECHNIQUE: 6.2 mCi F-18 FDG was injected intravenously. Full-ring PET imaging was performed from the skull base to thigh after the radiotracer. CT data was obtained and used for attenuation correction and anatomic localization. Fasting blood glucose: 92 mg/dl COMPARISON:  CTA chest dated 04/03/2019 FINDINGS: Mediastinal blood pool activity: SUV max 2.1 Liver activity: SUV max NA NECK: No hypermetabolic cervical lymphadenopathy. Incidental CT findings: none CHEST: 2.3 x 2.9 cm medial right upper lobe nodule (series 8/image 119), max SUV 14.8, compatible with primary bronchogenic neoplasm. 13 mm nodule inferiorly  in the right upper lobe adjacent to the minor fissure (series 8/image 30), max SUV 9.1, suspicious for metastasis. Additional 10 mm irregular nodule in the posterior right upper lobe adjacent to the minor fissure (series 8/image 29), max SUV 1.9, suspicious for metastasis. 6 mm nodule in the anterior left upper lobe (series 8/image 26), non FDG avid but beneath the size threshold for PET sensitivity. No hypermetabolic thoracic lymphadenopathy. Incidental CT findings: Mild centrilobular and paraseptal emphysematous changes, upper lung predominant. Linear scarring/atelectasis in the bilateral lower lobes, left greater than right. Mild coronary atherosclerosis of the LAD and right coronary artery. ABDOMEN/PELVIS: No hypermetabolic lymphadenopathy in the abdomen/pelvis. No abnormal hypermetabolism in the liver, spleen, pancreas, or adrenal glands. Incidental CT findings: Layering gallstones, without associated inflammatory changes. Atherosclerotic calcifications the abdominal aorta and branch vessels. Moderate fat containing supraumbilical ventral hernia (series 4/image 115). Small fat containing periumbilical hernia (series 4/image 126).  SKELETON: Multiple right posterior rib fractures, involving the right posterior 5th through 11th ribs. Multiple medial right rib fractures at the costovertebral junction, with associated right transverse process fractures, at T6-9. Representative max SUV 5.8 along the right posterior 5th rib. No focal hypermetabolic activity to suggest skeletal metastases. Incidental CT findings: none IMPRESSION: 2.9 cm nodule in the medial right upper lobe, suspicious for primary bronchogenic neoplasm. Two satellite nodule inferiorly in the right upper lobe, suspicious for metastases. 6 mm nodule in the anterior left upper lobe, indeterminate. Multiple right posterior chest wall fractures, likely related to recent trauma. Electronically Signed   By: Julian Hy M.D.   On: 04/18/2019 15:04     NM PET Image Restag (PS) Skull Base To Thigh  Result Date: 09/03/2019 CLINICAL DATA:  Subsequent treatment strategy for right upper lobe lung adenocarcinoma recently diagnosed on 08/10/2019 biopsy. EXAM: NUCLEAR MEDICINE PET SKULL BASE TO THIGH TECHNIQUE: 6.6 mCi F-18 FDG was injected intravenously. Full-ring PET imaging was performed from the skull base to thigh after the radiotracer. CT data was obtained and used for attenuation correction and anatomic localization. Fasting blood glucose: 101 mg/dl COMPARISON:  04/18/2019 PET-CT. FINDINGS: Mediastinal blood pool activity: SUV max 2.7 Liver activity: SUV max NA NECK: No hypermetabolic lymph nodes in the neck. Incidental CT findings: Mucoperiosteal thickening versus mucous retention cyst in the dependent left maxillary sinus. CHEST: Spiculated hypermetabolic 3.2 x 2.7 cm medial right upper lobe pleural-based lung mass with max SUV 14.5 (series 8/image 16), previously 3.2 x 2.7 cm on 04/18/2019 PET-CT using similar measurement technique with max SUV 14.8, stable. Two irregular solid hypermetabolic basilar right upper lobe pulmonary nodules, largest 1.4 cm with max SUV 13.4 (series  8/image 28), previously 1.4 cm with max SUV 9.1, stable in size and increased in metabolism. Two subsolid left upper lobe pulmonary nodules with borderline mild hypermetabolism, largest 1.2 cm anteriorly with max SUV 2.4 (series 8/image 23), previously 0.9 cm with max SUV 0.9, increased in size and metabolism. New mild hypermetabolism associated with normal size right paratracheal lymph nodes, for example measuring 0.6 cm with max SUV 3.3 (series 4/image 46). No enlarged or otherwise hypermetabolic mediastinal lymph nodes. No enlarged or hypermetabolic axillary or hilar lymph nodes. Incidental CT findings: Moderate centrilobular emphysema. Basilar left lower lobe 0.7 cm pulmonary nodule (series 8/image 54), below PET resolution and without definite hypermetabolism, previously obscured by atelectasis. Coronary atherosclerosis. ABDOMEN/PELVIS: Newly hypermetabolic nonenlarged 0.5 cm high right retrocaval retroperitoneal lymph node with max SUV 3.8 (series 4/image 96). No additional hypermetabolic or enlarged abdominal lymph nodes. No abnormal hypermetabolic  activity within the liver, pancreas, adrenal glands, or spleen. No hypermetabolic lymph nodes in the pelvis. Incidental CT findings: Cholelithiasis. Atherosclerotic nonaneurysmal abdominal aorta. Stable moderate umbilical and supraumbilical fat containing ventral abdominal hernias. SKELETON: No focal hypermetabolic activity to suggest skeletal metastasis. Incidental CT findings: none IMPRESSION: 1. Hypermetabolic spiculated pleural-based medial right upper lobe 3.2 cm lung mass, compatible with known primary bronchogenic carcinoma, stable since 04/18/2019 PET-CT. 2. Two separate hypermetabolic right upper lobe pulmonary metastases, increased in metabolism since 04/18/2019 PET-CT. 3. Two hypermetabolic subsolid left upper lobe pulmonary nodules, increased in size and metabolism since 04/18/2019 PET-CT, most compatible with contralateral pulmonary metastases. 4. New  mild hypermetabolism within nonenlarged right paratracheal lymph nodes, cannot exclude nodal metastases. 5. Newly hypermetabolic nonenlarged high right retroperitoneal retrocaval lymph node suspicious for nodal metastasis. 6. Chronic findings include: Aortic Atherosclerosis (ICD10-I70.0) and Emphysema (ICD10-J43.9). Coronary atherosclerosis. Cholelithiasis. Electronically Signed   By: Ilona Sorrel M.D.   On: 09/03/2019 14:26    MR Brain W Wo Contrast  Result Date: 09/04/2019 CLINICAL DATA:  Ataxia.  Lung cancer. EXAM: MRI HEAD WITHOUT AND WITH CONTRAST TECHNIQUE: Multiplanar, multiecho pulse sequences of the brain and surrounding structures were obtained without and with intravenous contrast. CONTRAST:  17mL MULTIHANCE GADOBENATE DIMEGLUMINE 529 MG/ML IV SOLN COMPARISON:  Head CT 01/10/2018 FINDINGS: The study is intermittently motion degraded. Brain: There is no evidence of acute infarct, intracranial hemorrhage, mass, midline shift, or extra-axial fluid collection. There is a chronic infarct centrally in the pons. No significant cerebral white matter disease is evident. There is mild cerebral and cerebellar atrophy. No definite enhancing brain lesions are identified although mild to moderate motion artifact on the axial T1 postcontrast sequence limits sensitivity for detection of very small lesions. A 1 mm focus of enhancement along the parasagittal left frontal lobe on the coronal sequence (series 17, image 26) is not confirmed on the other sequences and may be vascular. Vascular: Major intracranial vascular flow voids are preserved. Skull and upper cervical spine: No suspicious marrow lesion. Moderate cervical disc degeneration. Sinuses/Orbits: Unremarkable orbits. Mild left maxillary and posterior left ethmoid sinus mucosal thickening. Trace bilateral mastoid fluid. Other: None. IMPRESSION: 1. Motion degraded examination without acute infarct or convincing intracranial metastases. A 1 mm enhancing focus  along the parasagittal left frontal lobe on a single sequence may be vascular. Attention on follow-up studies to exclude early metastatic disease. 2. Chronic pontine infarct. Electronically Signed   By: Logan Bores M.D.   On: 09/04/2019 11:44     I have independently reviewed the above radiology studies  and reviewed the findings with the patient.   Recent Lab Findings: Lab Results  Component Value Date   WBC 3.1 (L) 08/10/2019   HGB 12.2 08/10/2019   HCT 37.4 08/10/2019   PLT 242 08/10/2019   GLUCOSE 111 (H) 04/04/2019   ALT 11 04/04/2019   AST 13 (L) 04/04/2019   NA 134 (L) 04/04/2019   K 3.4 (L) 04/04/2019   CL 99 04/04/2019   CREATININE 0.63 04/04/2019   BUN 10 04/04/2019   CO2 26 04/04/2019   INR 1.1 08/10/2019   PFT's FEV1 1.8-1.6 76% DLCO 9.6 43%  Post-Test Comments: Good patient effort. The results of this test does not meets ATS standards; DLCO recieved error code IVC < 90% HHN given with 2.5mg  of Albuterol Hgb default value of 13.4 used. Only on complete DLCO done pt c/o being tired. The FEV1, FEV1/FVC ratio and FEF25-75% are reduced indicating airway obstruction. The airway resistance  is normal. Lung volumes are within normal limits. Following administration of bronchodilators, there is no significant response. The reduced diffusing capacity indicates a severe loss of functional alveolar capillary surface. However, the diffusing capacity was not corrected for the patient's hemoglobin. Conclusions: Although there is airway obstruction and a diffusion defect suggesting emphysema, the absence of overinflation is inconsistent with that diagnosis. In view of the severity of the diffusion defect, studies with exercise would be helpful to evaluate the presence of hypoxemia. Pulmonary Function Diagnosis: Mild Obstructive Airways Disease Insignificant response to bronchodilator Severe Diffusion Defect  Before beginning test, pt rates dyspnea 0/10 and fatigue 0/10. Her  baseline VS at 1005 are as follows: BP 130/83, HR 120 (regular), Resp 20, spO2 98% on RA. Pt walks 9.5 laps in six minutes at a steady pace w/o complaint; this computes to 931 feet=284 meters. She rates dyspnea 0/10 and fatigue 0/10 during and after test. Her VS following test at 1011 are as follows: BP 115/81, HR 128 (regular), Resp 28, spO2 98% on RA.    PATH FINAL MICROSCOPIC DIAGNOSIS:   A. LUNG, RIGHT UPPER LOBE, NEEDLE CORE BIOPSY:  - Adenocarcinoma.   COMMENT:   There is likely sufficient tissue for additional studies (block A1). Dr.  Vic Ripper has reviewed the case.    GROSS DESCRIPTION:   The specimen is received in formalin and consists of 3 cores of tan-gray  soft tissue, ranging from 0.6 to 0.9 cm in length by 0.1 cm in diameter.  The specimen is entirely submitted in 2 cassettes. Craig Staggers 08/11/2019)     Final Diagnosis performed by Vicente Males, MD.    Assessment / Plan: Plan from FEB 2021   #1 patient with 3 cm right upper lobe mass highly suspicious for malignancy likely with at least pleural involvement of the posterior chest wall and 2 satellite lesions in the right upper lobe that are suggestive of malignancy-bronchoscopy done by the pulmonary service was unrevealing as far as diagnosis.-Pulmonary service as ordered CT-guided needle biopsy for February 22. #2 needs full evaluation of her respiratory status, screening spirometry was done by because of no Covid test full PFTs were not done-we will refer for full PFTs and obtain 6-minute walk test on her return At least clinical stage IIb malignancy with greater than 3 cm mass involving the right posterior chest wall but without bony destruction and 2 separate hypermetabolic satellite lesions in the same lobe. ( T3,N0,M)   We will plan to see the patient back quickly after her needle biopsy and completion of her functional status for making decision on treatment strategy.  NOW: #1 patient with biopsy-proven adenocarcinoma 3  cm right upper lobe mass adjacent to the pleura with 2 satellite lesions in the upper lobe.  In addition there is a small lesion in the left upper lobe.  The patient quit smoking in March. Because of the delay in her original diagnosis -a repeat PET scan was done  done to evaluate for any other metastatic disease that may have developed.   I seen the patient in the office today and reviewed with her the updated PET scan compared to the 1 4 months ago.  Now is more evident that there is a questionable involvement previously, more defined especially nodules in the left lung, retroperitoneal, mediastinal.  No definite brain metastasis are evident, but one area 1 mm in size left frontal lobe is questionable.  With what appears to be stage IV disease I discussed with the patient not proceeding  with surgical resection as this would be unlikely to benefit her in the long-term.  We will make a referral to medical oncology.  To be seen as soon as possible.    Grace Isaac MD      [address] [place] [address] Office [phone]     09/04/2019 2:24 PM

## 2019-09-04 NOTE — Progress Notes (Signed)
I received referral from Dr. Servando Snare today.  I updated new patient coordinator to call and schedule her to be seen with Dr. Worthy Flank PA, [facility] tomorrow.

## 2019-09-05 ENCOUNTER — Telehealth: Payer: Self-pay | Admitting: *Deleted

## 2019-09-05 ENCOUNTER — Encounter (HOSPITAL_COMMUNITY): Payer: Self-pay | Admitting: Internal Medicine

## 2019-09-05 NOTE — Telephone Encounter (Signed)
I received referral on Ms. Brenda [name].  I called to schedule her to be seen with Dr. Julien Nordmann but I was unable to reach her. I did leave vm message for her to call me with my name and phone number.

## 2019-09-06 ENCOUNTER — Encounter (HOSPITAL_COMMUNITY): Admission: RE | Payer: Self-pay | Source: Home / Self Care

## 2019-09-06 ENCOUNTER — Inpatient Hospital Stay (HOSPITAL_COMMUNITY): Admission: RE | Admit: 2019-09-06 | Payer: Self-pay | Source: Home / Self Care | Admitting: Cardiothoracic Surgery

## 2019-09-06 ENCOUNTER — Telehealth: Payer: Self-pay | Admitting: *Deleted

## 2019-09-06 SURGERY — BRONCHOSCOPY, VIDEO-ASSISTED
Anesthesia: General | Site: Chest | Laterality: Right

## 2019-09-06 NOTE — Telephone Encounter (Signed)
I called to set up an appt for Ms. Brenda [name] to see Dr. Julien Nordmann. I was unable to reach but did leave vm message with my name and phone number to call.

## 2019-09-07 ENCOUNTER — Telehealth: Payer: Self-pay | Admitting: *Deleted

## 2019-09-07 ENCOUNTER — Encounter: Payer: Self-pay | Admitting: *Deleted

## 2019-09-07 DIAGNOSIS — R918 Other nonspecific abnormal finding of lung field: Secondary | ICD-10-CM

## 2019-09-07 NOTE — Telephone Encounter (Signed)
I called Brenda [name] to schedule her to be seen with Dr. Julien Nordmann on 09/14/19. She verbalized understanding of appt time and place.

## 2019-09-08 ENCOUNTER — Encounter (HOSPITAL_COMMUNITY): Payer: Self-pay | Admitting: Internal Medicine

## 2019-09-11 ENCOUNTER — Telehealth: Payer: Self-pay | Admitting: Medical Oncology

## 2019-09-11 ENCOUNTER — Emergency Department (HOSPITAL_COMMUNITY): Payer: Medicaid Other

## 2019-09-11 ENCOUNTER — Other Ambulatory Visit: Payer: Self-pay

## 2019-09-11 ENCOUNTER — Emergency Department (HOSPITAL_COMMUNITY)
Admission: EM | Admit: 2019-09-11 | Discharge: 2019-09-11 | Disposition: A | Discharge status: Home / Self Care | Payer: Medicaid Other | Attending: Emergency Medicine | Admitting: Emergency Medicine

## 2019-09-11 ENCOUNTER — Encounter (HOSPITAL_COMMUNITY): Payer: Self-pay

## 2019-09-11 DIAGNOSIS — N611 Abscess of the breast and nipple: Secondary | ICD-10-CM | POA: Diagnosis not present

## 2019-09-11 DIAGNOSIS — S0083XA Contusion of other part of head, initial encounter: Secondary | ICD-10-CM | POA: Diagnosis not present

## 2019-09-11 DIAGNOSIS — F17210 Nicotine dependence, cigarettes, uncomplicated: Secondary | ICD-10-CM | POA: Insufficient documentation

## 2019-09-11 DIAGNOSIS — Y929 Unspecified place or not applicable: Secondary | ICD-10-CM | POA: Insufficient documentation

## 2019-09-11 DIAGNOSIS — T7491XA Unspecified adult maltreatment, confirmed, initial encounter: Secondary | ICD-10-CM

## 2019-09-11 DIAGNOSIS — R519 Headache, unspecified: Secondary | ICD-10-CM | POA: Diagnosis present

## 2019-09-11 DIAGNOSIS — Z23 Encounter for immunization: Secondary | ICD-10-CM | POA: Insufficient documentation

## 2019-09-11 DIAGNOSIS — Y999 Unspecified external cause status: Secondary | ICD-10-CM | POA: Diagnosis not present

## 2019-09-11 DIAGNOSIS — Z853 Personal history of malignant neoplasm of breast: Secondary | ICD-10-CM | POA: Diagnosis not present

## 2019-09-11 DIAGNOSIS — Y939 Activity, unspecified: Secondary | ICD-10-CM | POA: Insufficient documentation

## 2019-09-11 HISTORY — DX: Malignant neoplasm of unspecified site of unspecified female breast: C50.919
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
## 2019-09-11 MED ORDER — FLUORESCEIN SODIUM 1 MG OP STRP
1.0000 | ORAL_STRIP | Freq: Once | OPHTHALMIC | Status: AC
  Administered 2019-09-11: 1 via OPHTHALMIC
  Filled 2019-09-11: qty 1

## 2019-09-11 MED ORDER — LIDOCAINE-EPINEPHRINE (PF) 2 %-1:200000 IJ SOLN
10.0000 mL | Freq: Once | INTRAMUSCULAR | Status: AC
  Administered 2019-09-11: 10 mL
  Filled 2019-09-11: qty 20

## 2019-09-11 MED ORDER — MORPHINE SULFATE (PF) 4 MG/ML IV SOLN
4.0000 mg | Freq: Once | INTRAVENOUS | Status: AC
  Administered 2019-09-11: 4 mg via INTRAVENOUS
  Filled 2019-09-11: qty 1

## 2019-09-11 MED ORDER — SULFAMETHOXAZOLE-TRIMETHOPRIM 800-160 MG PO TABS
1.0000 | ORAL_TABLET | Freq: Two times a day (BID) | ORAL | 0 refills | Status: AC
Start: 2019-09-11 — End: 2019-09-18

## 2019-09-11 MED ORDER — SULFAMETHOXAZOLE-TRIMETHOPRIM 800-160 MG PO TABS
1.0000 | ORAL_TABLET | Freq: Once | ORAL | Status: AC
  Administered 2019-09-11: 1 via ORAL
  Filled 2019-09-11: qty 1

## 2019-09-11 MED ORDER — HYDROCODONE-ACETAMINOPHEN 5-325 MG PO TABS: 1.0000 | ORAL_TABLET | ORAL | 0 refills | Status: DC | PRN

## 2019-09-11 MED ORDER — ONDANSETRON HCL 4 MG/2ML IJ SOLN
4.0000 mg | Freq: Once | INTRAMUSCULAR | Status: AC
  Administered 2019-09-11: 4 mg via INTRAVENOUS
  Filled 2019-09-11: qty 2

## 2019-09-11 MED ORDER — TETRACAINE HCL 0.5 % OP SOLN
2.0000 [drp] | Freq: Once | OPHTHALMIC | Status: AC
  Administered 2019-09-11: 2 [drp] via OPHTHALMIC
  Filled 2019-09-11: qty 4

## 2019-09-11 MED ORDER — TETANUS-DIPHTH-ACELL PERTUSSIS 5-2.5-18.5 LF-MCG/0.5 IM SUSP
0.5000 mL | Freq: Once | INTRAMUSCULAR | Status: AC
  Administered 2019-09-11: 0.5 mL via INTRAMUSCULAR
  Filled 2019-09-11: qty 0.5
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
## 2019-09-11 NOTE — ED Notes (Signed)
Pt 20/40 visual acuity screening
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
## 2019-09-11 NOTE — ED Notes (Signed)
Discharge instructions discussed with pt. Pt verbalized understanding. Pt stable and ambulatory. No signature pad available. 

## 2019-09-11 NOTE — ED Triage Notes (Signed)
Pt arrives to ED w/ c/o R breast pain. Pt states she had surgery on Thurs. Pt reports 10/10 pain.

## 2019-09-12 ENCOUNTER — Telehealth: Payer: Self-pay | Admitting: *Deleted

## 2019-09-12 NOTE — Telephone Encounter (Signed)
I received a message that patient needed a reminder about her appt here this Thursday. I called and spoke with Ms. Brenda [name].  I update her on the appt time and place. She verbalized understanding.

## 2019-09-14 ENCOUNTER — Inpatient Hospital Stay (HOSPITAL_BASED_OUTPATIENT_CLINIC_OR_DEPARTMENT_OTHER): Payer: Medicaid Other | Admitting: Internal Medicine

## 2019-09-14 ENCOUNTER — Other Ambulatory Visit: Payer: Self-pay

## 2019-09-14 ENCOUNTER — Encounter: Payer: Self-pay | Admitting: Internal Medicine

## 2019-09-14 ENCOUNTER — Inpatient Hospital Stay: Payer: Medicaid Other | Attending: Internal Medicine

## 2019-09-14 VITALS — BP 132/80 | HR 91 | Temp 97.5°F | Resp 18 | Ht 66.0 in | Wt 109.7 lb

## 2019-09-14 DIAGNOSIS — R0609 Other forms of dyspnea: Secondary | ICD-10-CM

## 2019-09-14 DIAGNOSIS — I700 Atherosclerosis of aorta: Secondary | ICD-10-CM

## 2019-09-14 DIAGNOSIS — M25511 Pain in right shoulder: Secondary | ICD-10-CM | POA: Insufficient documentation

## 2019-09-14 DIAGNOSIS — Z79899 Other long term (current) drug therapy: Secondary | ICD-10-CM | POA: Diagnosis not present

## 2019-09-14 DIAGNOSIS — R0602 Shortness of breath: Secondary | ICD-10-CM

## 2019-09-14 DIAGNOSIS — R270 Ataxia, unspecified: Secondary | ICD-10-CM | POA: Insufficient documentation

## 2019-09-14 DIAGNOSIS — Z21 Asymptomatic human immunodeficiency virus [HIV] infection status: Secondary | ICD-10-CM

## 2019-09-14 DIAGNOSIS — F1010 Alcohol abuse, uncomplicated: Secondary | ICD-10-CM

## 2019-09-14 DIAGNOSIS — Z7189 Other specified counseling: Secondary | ICD-10-CM | POA: Insufficient documentation

## 2019-09-14 DIAGNOSIS — R5383 Other fatigue: Secondary | ICD-10-CM | POA: Diagnosis not present

## 2019-09-14 DIAGNOSIS — F1290 Cannabis use, unspecified, uncomplicated: Secondary | ICD-10-CM | POA: Insufficient documentation

## 2019-09-14 DIAGNOSIS — K8020 Calculus of gallbladder without cholecystitis without obstruction: Secondary | ICD-10-CM

## 2019-09-14 DIAGNOSIS — F17210 Nicotine dependence, cigarettes, uncomplicated: Secondary | ICD-10-CM

## 2019-09-14 DIAGNOSIS — Z5112 Encounter for antineoplastic immunotherapy: Secondary | ICD-10-CM | POA: Insufficient documentation

## 2019-09-14 DIAGNOSIS — C7802 Secondary malignant neoplasm of left lung: Secondary | ICD-10-CM | POA: Diagnosis not present

## 2019-09-14 DIAGNOSIS — R918 Other nonspecific abnormal finding of lung field: Secondary | ICD-10-CM

## 2019-09-14 DIAGNOSIS — I10 Essential (primary) hypertension: Secondary | ICD-10-CM | POA: Insufficient documentation

## 2019-09-14 DIAGNOSIS — R590 Localized enlarged lymph nodes: Secondary | ICD-10-CM | POA: Diagnosis not present

## 2019-09-14 DIAGNOSIS — Z800 Family history of malignant neoplasm of digestive organs: Secondary | ICD-10-CM | POA: Diagnosis not present

## 2019-09-14 DIAGNOSIS — Z853 Personal history of malignant neoplasm of breast: Secondary | ICD-10-CM | POA: Insufficient documentation

## 2019-09-14 DIAGNOSIS — F99 Mental disorder, not otherwise specified: Secondary | ICD-10-CM | POA: Insufficient documentation

## 2019-09-14 DIAGNOSIS — I2510 Atherosclerotic heart disease of native coronary artery without angina pectoris: Secondary | ICD-10-CM | POA: Insufficient documentation

## 2019-09-14 DIAGNOSIS — Z716 Tobacco abuse counseling: Secondary | ICD-10-CM | POA: Insufficient documentation

## 2019-09-14 DIAGNOSIS — C3491 Malignant neoplasm of unspecified part of right bronchus or lung: Secondary | ICD-10-CM | POA: Insufficient documentation

## 2019-09-14 DIAGNOSIS — F329 Major depressive disorder, single episode, unspecified: Secondary | ICD-10-CM

## 2019-09-14 DIAGNOSIS — C3411 Malignant neoplasm of upper lobe, right bronchus or lung: Secondary | ICD-10-CM

## 2019-09-14 DIAGNOSIS — Z8249 Family history of ischemic heart disease and other diseases of the circulatory system: Secondary | ICD-10-CM | POA: Insufficient documentation

## 2019-09-14 DIAGNOSIS — M5030 Other cervical disc degeneration, unspecified cervical region: Secondary | ICD-10-CM

## 2019-09-14 DIAGNOSIS — Z5111 Encounter for antineoplastic chemotherapy: Secondary | ICD-10-CM

## 2019-09-14 DIAGNOSIS — Z886 Allergy status to analgesic agent status: Secondary | ICD-10-CM | POA: Insufficient documentation

## 2019-09-14 DIAGNOSIS — J439 Emphysema, unspecified: Secondary | ICD-10-CM | POA: Diagnosis not present

## 2019-09-14 LAB — CMP (CANCER CENTER ONLY)
ALT: 15 U/L (ref 0–44)
AST: 23 U/L (ref 15–41)
Albumin: 3.1 g/dL — ABNORMAL LOW (ref 3.5–5.0)
Alkaline Phosphatase: 237 U/L — ABNORMAL HIGH (ref 38–126)
Anion gap: 11 (ref 5–15)
BUN: <4 mg/dL — ABNORMAL LOW (ref 6–20)
CO2: 23 mmol/L (ref 22–32)
Calcium: 9.2 mg/dL (ref 8.9–10.3)
Chloride: 101 mmol/L (ref 98–111)
Creatinine: 0.86 mg/dL (ref 0.44–1.00)
GFR, Est AFR Am: >60 mL/min (ref >60)
GFR, Estimated: >60 mL/min (ref >60)
Glucose, Bld: 95 mg/dL (ref 70–99)
Potassium: 3.6 mmol/L (ref 3.5–5.1)
Sodium: 135 mmol/L (ref 135–145)
Total Bilirubin: 0.3 mg/dL (ref 0.3–1.2)
Total Protein: 8 g/dL (ref 6.5–8.1)

## 2019-09-14 LAB — CBC WITH DIFFERENTIAL (CANCER CENTER ONLY)
Abs Immature Granulocytes: 0.03 10*3/uL (ref 0.00–0.07)
Basophils Absolute: 0 10*3/uL (ref 0.0–0.1)
Basophils Relative: 1 %
Eosinophils Absolute: 0.1 10*3/uL (ref 0.0–0.5)
Eosinophils Relative: 1 %
HCT: 38.8 % (ref 36.0–46.0)
Hemoglobin: 12.9 g/dL (ref 12.0–15.0)
Immature Granulocytes: 1 %
Lymphocytes Relative: 28 %
Lymphs Abs: 1.4 10*3/uL (ref 0.7–4.0)
MCH: 32.5 pg (ref 26.0–34.0)
MCHC: 33.2 g/dL (ref 30.0–36.0)
MCV: 97.7 fL (ref 80.0–100.0)
Monocytes Absolute: 0.5 10*3/uL (ref 0.1–1.0)
Monocytes Relative: 10 %
Neutro Abs: 3 10*3/uL (ref 1.7–7.7)
Neutrophils Relative %: 59 %
Platelet Count: 348 10*3/uL (ref 150–400)
RBC: 3.97 MIL/uL (ref 3.87–5.11)
RDW: 14.6 % (ref 11.5–15.5)
WBC Count: 5 10*3/uL (ref 4.0–10.5)
nRBC: 0 % (ref 0.0–0.2)

## 2019-09-14 MED ORDER — FOLIC ACID 1 MG PO TABS
1.0000 mg | ORAL_TABLET | Freq: Every day | ORAL | 4 refills | Status: DC
Start: 2019-09-14 — End: 2019-09-22

## 2019-09-14 MED ORDER — CYANOCOBALAMIN 1000 MCG/ML IJ SOLN
1000.0000 ug | Freq: Once | INTRAMUSCULAR | Status: AC
  Administered 2019-09-14: 1000 ug via INTRAMUSCULAR

## 2019-09-14 MED ORDER — CYANOCOBALAMIN 1000 MCG/ML IJ SOLN
INTRAMUSCULAR | Status: AC
  Filled 2019-09-14: qty 1
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
## 2019-09-14 NOTE — Progress Notes (Signed)
START ON PATHWAY REGIMEN - Non-Small Cell Lung     A cycle is every 21 days:     Pembrolizumab      Pemetrexed      Carboplatin   **Always confirm dose/schedule in your pharmacy ordering system**  Patient Characteristics: Stage IV Metastatic, Nonsquamous, Initial Chemotherapy/Immunotherapy, PS = 0, 1, ALK Rearrangement Negative and ROS1 Rearrangement Negative and NTRK Gene Fusion?Negative and RET Gene Fusion?Negative and EGFR Mutation Negative/Non?Sensitizing, PD-L1  Expression Positive 1-49% (TPS) / Negative / Not Tested / Awaiting Test Results and Immunotherapy Candidate Therapeutic Status: Stage IV Metastatic Histology: Nonsquamous Cell ROS1 Rearrangement Status: Negative Other Mutations/Biomarkers: No Other Actionable Mutations NTRK Gene Fusion Status: Negative PD-L1 Expression Status: PD-L1 Negative Chemotherapy/Immunotherapy LOT: Initial Chemotherapy/Immunotherapy Molecular Targeted Therapy: Not Appropriate MET Exon 14 Mutation Status: Negative RET Gene Fusion Status: Negative ALK Rearrangement Status: Negative EGFR Mutation Status: Negative/Wild Type BRAF [id number] Mutation Status: Negative ECOG Performance Status: 1 Biomarker Assessment Status Confirmation: All Genomic Markers Negative or Only MET+ or BRAF+ Immunotherapy Candidate Status: Candidate for Immunotherapy Intent of Therapy: Non-Curative / Palliative Intent, Discussed with Patient 

## 2019-09-19 ENCOUNTER — Other Ambulatory Visit: Payer: Self-pay | Admitting: Medical Oncology

## 2019-09-19 ENCOUNTER — Encounter: Payer: Self-pay | Admitting: *Deleted

## 2019-09-19 ENCOUNTER — Inpatient Hospital Stay: Payer: Medicaid Other

## 2019-09-19 ENCOUNTER — Other Ambulatory Visit: Payer: Self-pay

## 2019-09-19 DIAGNOSIS — Z5111 Encounter for antineoplastic chemotherapy: Secondary | ICD-10-CM

## 2019-09-19 MED ORDER — PROCHLORPERAZINE MALEATE 10 MG PO TABS: 10.0000 mg | ORAL_TABLET | Freq: Four times a day (QID) | ORAL | 0 refills | Status: DC | PRN
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
## 2019-09-20 ENCOUNTER — Encounter: Payer: Self-pay | Admitting: *Deleted

## 2019-09-20 NOTE — Progress Notes (Signed)
[name]  Clinical Social Work received referral form medical oncology/lung navigator for psychosocial support and assessment of needs.  CSW contacted patient by phone to offer support and assess for needs.  Patient stated "overall she was doing well and felt supported."  Patient reported she was previously staying at the [facility] on [address], but was currently staying with her son.  Patient also identified her niece Brenda [name] as positive support.  Patient is currently uninsured, and stated she called DSS approximately 2 1/2 weeks ago to apply for Medicaid.  She was told paperwork would be mailed to her.  Patient plans to check her PO Box this afternoon.  CSW and patient discussed applying for social security disability.  Patient stated she has not started the application process, and was agreeable to a [facility] referral.  CSW completed referral to [facility].  CSW provided education on CSW role, and support programs at [facility].  Patient was agreeable to CSW contacting patients niece to gather additional information.  CSW contacted Brenda [name] and left a messaging encouraging a return call.  CSW will continue to follow and support as needed.    Brenda [name], MSW, LCSW, OSW-C Clinical Social Worker [facility] [phone]

## 2019-09-21 ENCOUNTER — Telehealth: Payer: Self-pay | Admitting: Medical Oncology

## 2019-09-21 ENCOUNTER — Encounter: Payer: Self-pay | Admitting: Internal Medicine

## 2019-09-21 NOTE — Progress Notes (Signed)
Spoke with patient regarding applying for one-time $1000 [employer] grant to assist with medication and other critical expenses. Advised what is needed to apply. She states she will try to bring 09/22/19.  Gave her my contact name of whom to ask for.

## 2019-09-21 NOTE — Telephone Encounter (Signed)
Transferred Compazine rx to [facility] outpt so pt can use grant money.

## 2019-09-21 NOTE — Progress Notes (Signed)
Pharmacist Chemotherapy Monitoring - Initial Assessment    Anticipated start date: 09/22/19   Regimen:   Are orders appropriate based on the patients diagnosis, regimen, and cycle? Yes  Does the plan date match the patients scheduled date? Yes  Is the sequencing of drugs appropriate? Yes  Are the premedications appropriate for the patients regimen? Yes  Prior Authorization for treatment is: Uninsured o If applicable, is the correct biosimilar selected based on the patient's insurance? not applicable  Organ Function and Labs:  Are dose adjustments needed based on the patient's renal function, hepatic function, or hematologic function? No  Are appropriate labs ordered prior to the start of patient's treatment? Yes  Other organ system assessment, if indicated: N/A  The following baseline labs, if indicated, have been ordered: pembrolizumab: baseline TSH +/- T4  Dose Assessment:  Are the drug doses appropriate? Yes  Are the following correct: o Drug concentrations Yes o IV fluid compatible with drug Yes o Administration routes Yes o Timing of therapy Yes  If applicable, does the patient have documented access for treatment and/or plans for port-a-cath placement? no  If applicable, have lifetime cumulative doses been properly documented and assessed? not applicable Lifetime Dose Tracking  No doses have been documented on this patient for the following tracked chemicals: Doxorubicin, Epirubicin, Idarubicin, Daunorubicin, Mitoxantrone, Bleomycin, Oxaliplatin, Carboplatin, Liposomal Doxorubicin  o   Toxicity Monitoring/Prevention:  The patient has the following take home antiemetics prescribed: Prochlorperazine  The patient has the following take home medications prescribed: B12 for pemetrexed  Medication allergies and previous infusion related reactions, if applicable, have been reviewed and addressed. No  The patient's current medication list has been assessed for  drug-drug interactions with their chemotherapy regimen. no significant drug-drug interactions were identified on review.  Order Review:  Are the treatment plan orders signed? Yes  Is the patient scheduled to see a provider prior to their treatment? No  I verify that I have reviewed each item in the above checklist and answered each question accordingly.  Romualdo Bolk [facility] 09/21/2019 11:57 AM

## 2019-09-22 ENCOUNTER — Inpatient Hospital Stay: Payer: Medicaid Other | Attending: Internal Medicine

## 2019-09-22 ENCOUNTER — Telehealth: Payer: Self-pay | Admitting: Physician Assistant

## 2019-09-22 ENCOUNTER — Other Ambulatory Visit: Payer: Self-pay

## 2019-09-22 ENCOUNTER — Encounter: Payer: Self-pay | Admitting: Physician Assistant

## 2019-09-22 ENCOUNTER — Inpatient Hospital Stay: Payer: Medicaid Other

## 2019-09-22 ENCOUNTER — Other Ambulatory Visit: Payer: Self-pay | Admitting: Medical Oncology

## 2019-09-22 DIAGNOSIS — R0609 Other forms of dyspnea: Secondary | ICD-10-CM | POA: Insufficient documentation

## 2019-09-22 DIAGNOSIS — Z5189 Encounter for other specified aftercare: Secondary | ICD-10-CM | POA: Insufficient documentation

## 2019-09-22 DIAGNOSIS — Z886 Allergy status to analgesic agent status: Secondary | ICD-10-CM | POA: Insufficient documentation

## 2019-09-22 DIAGNOSIS — R590 Localized enlarged lymph nodes: Secondary | ICD-10-CM | POA: Diagnosis not present

## 2019-09-22 DIAGNOSIS — Z79899 Other long term (current) drug therapy: Secondary | ICD-10-CM | POA: Diagnosis not present

## 2019-09-22 DIAGNOSIS — Z5111 Encounter for antineoplastic chemotherapy: Secondary | ICD-10-CM | POA: Insufficient documentation

## 2019-09-22 DIAGNOSIS — R05 Cough: Secondary | ICD-10-CM | POA: Insufficient documentation

## 2019-09-22 DIAGNOSIS — C3491 Malignant neoplasm of unspecified part of right bronchus or lung: Secondary | ICD-10-CM

## 2019-09-22 DIAGNOSIS — C3411 Malignant neoplasm of upper lobe, right bronchus or lung: Secondary | ICD-10-CM | POA: Insufficient documentation

## 2019-09-22 DIAGNOSIS — R0602 Shortness of breath: Secondary | ICD-10-CM | POA: Diagnosis not present

## 2019-09-22 DIAGNOSIS — Z853 Personal history of malignant neoplasm of breast: Secondary | ICD-10-CM | POA: Insufficient documentation

## 2019-09-22 DIAGNOSIS — Z5112 Encounter for antineoplastic immunotherapy: Secondary | ICD-10-CM | POA: Insufficient documentation

## 2019-09-22 LAB — CMP (CANCER CENTER ONLY)
ALT: 18 U/L (ref 0–44)
AST: 24 U/L (ref 15–41)
Albumin: 3 g/dL — ABNORMAL LOW (ref 3.5–5.0)
Alkaline Phosphatase: 168 U/L — ABNORMAL HIGH (ref 38–126)
Anion gap: 14 (ref 5–15)
BUN: <4 mg/dL — ABNORMAL LOW (ref 6–20)
CO2: 17 mmol/L — ABNORMAL LOW (ref 22–32)
Calcium: 8.2 mg/dL — ABNORMAL LOW (ref 8.9–10.3)
Chloride: 100 mmol/L (ref 98–111)
Creatinine: 0.66 mg/dL (ref 0.44–1.00)
GFR, Est AFR Am: >60 mL/min (ref >60)
GFR, Estimated: >60 mL/min (ref >60)
Glucose, Bld: 73 mg/dL (ref 70–99)
Potassium: 3.5 mmol/L (ref 3.5–5.1)
Sodium: 131 mmol/L — ABNORMAL LOW (ref 135–145)
Total Bilirubin: <0.2 mg/dL — ABNORMAL LOW (ref 0.3–1.2)
Total Protein: 7.4 g/dL (ref 6.5–8.1)

## 2019-09-22 LAB — CBC WITH DIFFERENTIAL (CANCER CENTER ONLY)
Abs Immature Granulocytes: 0.01 10*3/uL (ref 0.00–0.07)
Basophils Absolute: 0 10*3/uL (ref 0.0–0.1)
Basophils Relative: 0 %
Eosinophils Absolute: 0 10*3/uL (ref 0.0–0.5)
Eosinophils Relative: 1 %
HCT: 40.7 % (ref 36.0–46.0)
Hemoglobin: 13.5 g/dL (ref 12.0–15.0)
Immature Granulocytes: 0 %
Lymphocytes Relative: 65 %
Lymphs Abs: 1.9 10*3/uL (ref 0.7–4.0)
MCH: 32.3 pg (ref 26.0–34.0)
MCHC: 33.2 g/dL (ref 30.0–36.0)
MCV: 97.4 fL (ref 80.0–100.0)
Monocytes Absolute: 0.2 10*3/uL (ref 0.1–1.0)
Monocytes Relative: 7 %
Neutro Abs: 0.8 10*3/uL — ABNORMAL LOW (ref 1.7–7.7)
Neutrophils Relative %: 27 %
Platelet Count: 191 10*3/uL (ref 150–400)
RBC: 4.18 MIL/uL (ref 3.87–5.11)
RDW: 14.9 % (ref 11.5–15.5)
WBC Count: 2.9 10*3/uL — ABNORMAL LOW (ref 4.0–10.5)
nRBC: 0 % (ref 0.0–0.2)

## 2019-09-22 LAB — TSH: TSH: 1.28 u[IU]/mL (ref 0.308–3.960)

## 2019-09-22 MED ORDER — FOLIC ACID 1 MG PO TABS: 1.0000 mg | ORAL_TABLET | Freq: Every day | ORAL | 4 refills | Status: DC

## 2019-09-22 MED FILL — FOLIC ACID 1 MG TABS: 1 | 30 days supply | Qty: 30 | Fill #0

## 2019-09-22 MED FILL — PROCHLORPERAZINE 10 MG TAB: 10 | 7 days supply | Qty: 30 | Fill #0

## 2019-09-22 NOTE — Patient Instructions (Signed)
Carboplatin injection What is this medicine? CARBOPLATIN (KAR boe pla tin) is a chemotherapy drug. It targets fast dividing cells, like cancer cells, and causes these cells to die. This medicine is used to treat ovarian cancer and many other cancers. This medicine may be used for other purposes; ask your health care provider or pharmacist if you have questions. COMMON BRAND NAME(S): Paraplatin What should I tell my health care provider before I take this medicine? They need to know if you have any of these conditions:  blood disorders  hearing problems  kidney disease  recent or ongoing radiation therapy  an unusual or allergic reaction to carboplatin, cisplatin, other chemotherapy, other medicines, foods, dyes, or preservatives  pregnant or trying to get pregnant  breast-feeding How should I use this medicine? This drug is usually given as an infusion into a vein. It is administered in a hospital or clinic by a specially trained health care professional. Talk to your pediatrician regarding the use of this medicine in children. Special care may be needed. Overdosage: If you think you have taken too much of this medicine contact a poison control center or emergency room at once. NOTE: This medicine is only for you. Do not share this medicine with others. What if I miss a dose? It is important not to miss a dose. Call your doctor or health care professional if you are unable to keep an appointment. What may interact with this medicine?  medicines for seizures  medicines to increase blood counts like filgrastim, pegfilgrastim, sargramostim  some antibiotics like amikacin, gentamicin, neomycin, streptomycin, tobramycin  vaccines Talk to your doctor or health care professional before taking any of these medicines:  acetaminophen  aspirin  ibuprofen  ketoprofen  naproxen This list may not describe all possible interactions. Give your health care provider a list of all the  medicines, herbs, non-prescription drugs, or dietary supplements you use. Also tell them if you smoke, drink alcohol, or use illegal drugs. Some items may interact with your medicine. What should I watch for while using this medicine? Your condition will be monitored carefully while you are receiving this medicine. You will need important blood work done while you are taking this medicine. This drug may make you feel generally unwell. This is not uncommon, as chemotherapy can affect healthy cells as well as cancer cells. Report any side effects. Continue your course of treatment even though you feel ill unless your doctor tells you to stop. In some cases, you may be given additional medicines to help with side effects. Follow all directions for their use. Call your doctor or health care professional for advice if you get a fever, chills or sore throat, or other symptoms of a cold or flu. Do not treat yourself. This drug decreases your body's ability to fight infections. Try to avoid being around people who are sick. This medicine may increase your risk to bruise or bleed. Call your doctor or health care professional if you notice any unusual bleeding. Be careful brushing and flossing your teeth or using a toothpick because you may get an infection or bleed more easily. If you have any dental work done, tell your dentist you are receiving this medicine. Avoid taking products that contain aspirin, acetaminophen, ibuprofen, naproxen, or ketoprofen unless instructed by your doctor. These medicines may hide a fever. Do not become pregnant while taking this medicine. Women should inform their doctor if they wish to become pregnant or think they might be pregnant. There is a  potential for serious side effects to an unborn child. Talk to your health care professional or pharmacist for more information. Do not breast-feed an infant while taking this medicine. What side effects may I notice from receiving this  medicine? Side effects that you should report to your doctor or health care professional as soon as possible:  allergic reactions like skin rash, itching or hives, swelling of the face, lips, or tongue  signs of infection - fever or chills, cough, sore throat, pain or difficulty passing urine  signs of decreased platelets or bleeding - bruising, pinpoint red spots on the skin, black, tarry stools, nosebleeds  signs of decreased red blood cells - unusually weak or tired, fainting spells, lightheadedness  breathing problems  changes in hearing  changes in vision  chest pain  high blood pressure  low blood counts - This drug may decrease the number of white blood cells, red blood cells and platelets. You may be at increased risk for infections and bleeding.  nausea and vomiting  pain, swelling, redness or irritation at the injection site  pain, tingling, numbness in the hands or feet  problems with balance, talking, walking  trouble passing urine or change in the amount of urine Side effects that usually do not require medical attention (report to your doctor or health care professional if they continue or are bothersome):  hair loss  loss of appetite  metallic taste in the mouth or changes in taste This list may not describe all possible side effects. Call your doctor for medical advice about side effects. You may report side effects to FDA at 1-800-FDA-1088. Where should I keep my medicine? This drug is given in a hospital or clinic and will not be stored at home. NOTE: This sheet is a summary. It may not cover all possible information. If you have questions about this medicine, talk to your doctor, pharmacist, or health care provider.  2020 Elsevier/Gold Standard (2007-06-14 14:38:05) Pemetrexed injection What is this medicine? PEMETREXED (PEM e TREX ed) is a chemotherapy drug used to treat lung cancers like non-small cell lung cancer and mesothelioma. It may also be  used to treat other cancers. This medicine may be used for other purposes; ask your health care provider or pharmacist if you have questions. COMMON BRAND NAME(S): Alimta What should I tell my health care provider before I take this medicine? They need to know if you have any of these conditions:  infection (especially a virus infection such as chickenpox, cold sores, or herpes)  kidney disease  low blood counts, like low white cell, platelet, or red cell counts  lung or breathing disease, like asthma  radiation therapy  an unusual or allergic reaction to pemetrexed, other medicines, foods, dyes, or preservative  pregnant or trying to get pregnant  breast-feeding How should I use this medicine? This drug is given as an infusion into a vein. It is administered in a hospital or clinic by a specially trained health care professional. Talk to your pediatrician regarding the use of this medicine in children. Special care may be needed. Overdosage: If you think you have taken too much of this medicine contact a poison control center or emergency room at once. NOTE: This medicine is only for you. Do not share this medicine with others. What if I miss a dose? It is important not to miss your dose. Call your doctor or health care professional if you are unable to keep an appointment. What may interact with this medicine?  This medicine may interact with the following medications:  Ibuprofen This list may not describe all possible interactions. Give your health care provider a list of all the medicines, herbs, non-prescription drugs, or dietary supplements you use. Also tell them if you smoke, drink alcohol, or use illegal drugs. Some items may interact with your medicine. What should I watch for while using this medicine? Visit your doctor for checks on your progress. This drug may make you feel generally unwell. This is not uncommon, as chemotherapy can affect healthy cells as well as cancer  cells. Report any side effects. Continue your course of treatment even though you feel ill unless your doctor tells you to stop. In some cases, you may be given additional medicines to help with side effects. Follow all directions for their use. Call your doctor or health care professional for advice if you get a fever, chills or sore throat, or other symptoms of a cold or flu. Do not treat yourself. This drug decreases your body's ability to fight infections. Try to avoid being around people who are sick. This medicine may increase your risk to bruise or bleed. Call your doctor or health care professional if you notice any unusual bleeding. Be careful brushing and flossing your teeth or using a toothpick because you may get an infection or bleed more easily. If you have any dental work done, tell your dentist you are receiving this medicine. Avoid taking products that contain aspirin, acetaminophen, ibuprofen, naproxen, or ketoprofen unless instructed by your doctor. These medicines may hide a fever. Call your doctor or health care professional if you get diarrhea or mouth sores. Do not treat yourself. To protect your kidneys, drink water or other fluids as directed while you are taking this medicine. Do not become pregnant while taking this medicine or for 6 months after stopping it. Women should inform their doctor if they wish to become pregnant or think they might be pregnant. Men should not father a child while taking this medicine and for 3 months after stopping it. This may interfere with the ability to father a child. You should talk to your doctor or health care professional if you are concerned about your fertility. There is a potential for serious side effects to an unborn child. Talk to your health care professional or pharmacist for more information. Do not breast-feed an infant while taking this medicine or for 1 week after stopping it. What side effects may I notice from receiving this  medicine? Side effects that you should report to your doctor or health care professional as soon as possible:  allergic reactions like skin rash, itching or hives, swelling of the face, lips, or tongue  breathing problems  redness, blistering, peeling or loosening of the skin, including inside the mouth  signs and symptoms of bleeding such as bloody or black, tarry stools; red or dark-brown urine; spitting up blood or brown material that looks like coffee grounds; red spots on the skin; unusual bruising or bleeding from the eye, gums, or nose  signs and symptoms of infection like fever or chills; cough; sore throat; pain or trouble passing urine  signs and symptoms of kidney injury like trouble passing urine or change in the amount of urine  signs and symptoms of liver injury like dark yellow or brown urine; general ill feeling or flu-like symptoms; light-colored stools; loss of appetite; nausea; right upper belly pain; unusually weak or tired; yellowing of the eyes or skin Side effects that usually do  not require medical attention (report to your doctor or health care professional if they continue or are bothersome):  constipation  mouth sores  nausea, vomiting  unusually weak or tired This list may not describe all possible side effects. Call your doctor for medical advice about side effects. You may report side effects to FDA at 1-800-FDA-1088. Where should I keep my medicine? This drug is given in a hospital or clinic and will not be stored at home. NOTE: This sheet is a summary. It may not cover all possible information. If you have questions about this medicine, talk to your doctor, pharmacist, or health care provider.  2020 Elsevier/Gold Standard (2017-04-28 16:11:33) Pembrolizumab injection What is this medicine? PEMBROLIZUMAB (pem broe liz ue mab) is a monoclonal antibody. It is used to treat certain types of cancer. This medicine may be used for other purposes; ask your  health care provider or pharmacist if you have questions. COMMON BRAND NAME(S): Keytruda What should I tell my health care provider before I take this medicine? They need to know if you have any of these conditions:  diabetes  immune system problems  inflammatory bowel disease  liver disease  lung or breathing disease  lupus  received or scheduled to receive an organ transplant or a stem-cell transplant that uses donor stem cells  an unusual or allergic reaction to pembrolizumab, other medicines, foods, dyes, or preservatives  pregnant or trying to get pregnant  breast-feeding How should I use this medicine? This medicine is for infusion into a vein. It is given by a health care professional in a hospital or clinic setting. A special MedGuide will be given to you before each treatment. Be sure to read this information carefully each time. Talk to your pediatrician regarding the use of this medicine in children. While this drug may be prescribed for children as young as 6 months for selected conditions, precautions do apply. Overdosage: If you think you have taken too much of this medicine contact a poison control center or emergency room at once. NOTE: This medicine is only for you. Do not share this medicine with others. What if I miss a dose? It is important not to miss your dose. Call your doctor or health care professional if you are unable to keep an appointment. What may interact with this medicine? Interactions have not been studied. Give your health care provider a list of all the medicines, herbs, non-prescription drugs, or dietary supplements you use. Also tell them if you smoke, drink alcohol, or use illegal drugs. Some items may interact with your medicine. This list may not describe all possible interactions. Give your health care provider a list of all the medicines, herbs, non-prescription drugs, or dietary supplements you use. Also tell them if you smoke, drink  alcohol, or use illegal drugs. Some items may interact with your medicine. What should I watch for while using this medicine? Your condition will be monitored carefully while you are receiving this medicine. You may need blood work done while you are taking this medicine. Do not become pregnant while taking this medicine or for 4 months after stopping it. Women should inform their doctor if they wish to become pregnant or think they might be pregnant. There is a potential for serious side effects to an unborn child. Talk to your health care professional or pharmacist for more information. Do not breast-feed an infant while taking this medicine or for 4 months after the last dose. What side effects may I notice  from receiving this medicine? Side effects that you should report to your doctor or health care professional as soon as possible:  allergic reactions like skin rash, itching or hives, swelling of the face, lips, or tongue  bloody or black, tarry  breathing problems  changes in vision  chest pain  chills  confusion  constipation  cough  diarrhea  dizziness or feeling faint or lightheaded  fast or irregular heartbeat  fever  flushing  joint pain  low blood counts - this medicine may decrease the number of white blood cells, red blood cells and platelets. You may be at increased risk for infections and bleeding.  muscle pain  muscle weakness  pain, tingling, numbness in the hands or feet  persistent headache  redness, blistering, peeling or loosening of the skin, including inside the mouth  signs and symptoms of high blood sugar such as dizziness; dry mouth; dry skin; fruity breath; nausea; stomach pain; increased hunger or thirst; increased urination  signs and symptoms of kidney injury like trouble passing urine or change in the amount of urine  signs and symptoms of liver injury like dark urine, light-colored stools, loss of appetite, nausea, right upper  belly pain, yellowing of the eyes or skin  sweating  swollen lymph nodes  weight loss Side effects that usually do not require medical attention (report to your doctor or health care professional if they continue or are bothersome):  decreased appetite  hair loss  muscle pain  tiredness This list may not describe all possible side effects. Call your doctor for medical advice about side effects. You may report side effects to FDA at 1-800-FDA-1088. Where should I keep my medicine? This drug is given in a hospital or clinic and will not be stored at home. NOTE: This sheet is a summary. It may not cover all possible information. If you have questions about this medicine, talk to your doctor, pharmacist, or health care provider.  2020 Elsevier/Gold Standard (2019-01-13 18:07:58)

## 2019-09-22 NOTE — Progress Notes (Unsigned)
Treatment being held today due to low ANC per DR Julien Nordmann.  Pt to return next week.

## 2019-09-22 NOTE — Progress Notes (Signed)
Met with patient at registration whom brought letter of support for [employer].  Patient approved for one-time $1000 [employer] grant to assist with medication and personal expenses while going through treatment. Discussed in detail expenses and how they are covered. Gave patient a copy of the approval letter and expense sheet as well as the Outpatient pharmacy information. Patient received a gas card today from her grant.  She has my card for any additional financial questions or concerns.

## 2019-09-22 NOTE — Telephone Encounter (Signed)
Scheduled appt per 7/2 sch message - unable to reach pt . Left message for patient on [employer]

## 2019-09-28 ENCOUNTER — Inpatient Hospital Stay: Payer: Medicaid Other

## 2019-09-28 ENCOUNTER — Other Ambulatory Visit: Payer: Self-pay

## 2019-09-28 ENCOUNTER — Ambulatory Visit: Payer: Self-pay | Admitting: Physician Assistant

## 2019-09-28 VITALS — BP 127/81 | HR 79 | Temp 98.0°F | Resp 18 | Ht 66.0 in | Wt 114.5 lb

## 2019-09-28 DIAGNOSIS — Z5112 Encounter for antineoplastic immunotherapy: Secondary | ICD-10-CM | POA: Diagnosis not present

## 2019-09-28 DIAGNOSIS — C3491 Malignant neoplasm of unspecified part of right bronchus or lung: Secondary | ICD-10-CM

## 2019-09-28 LAB — CMP (CANCER CENTER ONLY)
ALT: 17 U/L (ref 0–44)
AST: 23 U/L (ref 15–41)
Albumin: 3 g/dL — ABNORMAL LOW (ref 3.5–5.0)
Alkaline Phosphatase: 151 U/L — ABNORMAL HIGH (ref 38–126)
Anion gap: 10 (ref 5–15)
BUN: 5 mg/dL — ABNORMAL LOW (ref 6–20)
CO2: 20 mmol/L — ABNORMAL LOW (ref 22–32)
Calcium: 8.9 mg/dL (ref 8.9–10.3)
Chloride: 108 mmol/L (ref 98–111)
Creatinine: 0.68 mg/dL (ref 0.44–1.00)
GFR, Est AFR Am: >60 mL/min (ref >60)
GFR, Estimated: >60 mL/min (ref >60)
Glucose, Bld: 83 mg/dL (ref 70–99)
Potassium: 4 mmol/L (ref 3.5–5.1)
Sodium: 138 mmol/L (ref 135–145)
Total Bilirubin: 0.3 mg/dL (ref 0.3–1.2)
Total Protein: 7.3 g/dL (ref 6.5–8.1)

## 2019-09-28 LAB — CBC WITH DIFFERENTIAL (CANCER CENTER ONLY)
Abs Immature Granulocytes: 0 10*3/uL (ref 0.00–0.07)
Basophils Absolute: 0 10*3/uL (ref 0.0–0.1)
Basophils Relative: 1 %
Eosinophils Absolute: 0 10*3/uL (ref 0.0–0.5)
Eosinophils Relative: 0 %
HCT: 40.9 % (ref 36.0–46.0)
Hemoglobin: 13.3 g/dL (ref 12.0–15.0)
Immature Granulocytes: 0 %
Lymphocytes Relative: 50 %
Lymphs Abs: 2 10*3/uL (ref 0.7–4.0)
MCH: 32.2 pg (ref 26.0–34.0)
MCHC: 32.5 g/dL (ref 30.0–36.0)
MCV: 99 fL (ref 80.0–100.0)
Monocytes Absolute: 0.5 10*3/uL (ref 0.1–1.0)
Monocytes Relative: 13 %
Neutro Abs: 1.4 10*3/uL — ABNORMAL LOW (ref 1.7–7.7)
Neutrophils Relative %: 36 %
Platelet Count: 228 10*3/uL (ref 150–400)
RBC: 4.13 MIL/uL (ref 3.87–5.11)
RDW: 15.4 % (ref 11.5–15.5)
WBC Count: 3.9 10*3/uL — ABNORMAL LOW (ref 4.0–10.5)
nRBC: 0 % (ref 0.0–0.2)

## 2019-09-28 MED ORDER — SODIUM CHLORIDE 0.9 % IV SOLN
Freq: Once | INTRAVENOUS | Status: AC
  Filled 2019-09-28: qty 250

## 2019-09-28 MED ORDER — PALONOSETRON HCL INJECTION 0.25 MG/5ML
INTRAVENOUS | Status: AC
  Filled 2019-09-28: qty 5

## 2019-09-28 MED ORDER — SODIUM CHLORIDE 0.9 % IV SOLN
150.0000 mg | Freq: Once | INTRAVENOUS | Status: AC
  Administered 2019-09-28: 150 mg via INTRAVENOUS
  Filled 2019-09-28: qty 5
  Filled 2019-09-28: qty 150

## 2019-09-28 MED ORDER — PALONOSETRON HCL INJECTION 0.25 MG/5ML
0.2500 mg | Freq: Once | INTRAVENOUS | Status: AC
  Administered 2019-09-28: 0.25 mg via INTRAVENOUS

## 2019-09-28 MED ORDER — SODIUM CHLORIDE 0.9 % IV SOLN
200.0000 mg | Freq: Once | INTRAVENOUS | Status: AC
  Administered 2019-09-28: 200 mg via INTRAVENOUS
  Filled 2019-09-28: qty 8

## 2019-09-28 MED ORDER — SODIUM CHLORIDE 0.9 % IV SOLN
440.0000 mg | Freq: Once | INTRAVENOUS | Status: AC
  Administered 2019-09-28: 440 mg via INTRAVENOUS
  Filled 2019-09-28: qty 44

## 2019-09-28 MED ORDER — SODIUM CHLORIDE 0.9 % IV SOLN
500.0000 mg/m2 | Freq: Once | INTRAVENOUS | Status: AC
  Administered 2019-09-28: 800 mg via INTRAVENOUS
  Filled 2019-09-28: qty 20

## 2019-09-28 MED ORDER — SODIUM CHLORIDE 0.9 % IV SOLN
10.0000 mg | Freq: Once | INTRAVENOUS | Status: AC
  Administered 2019-09-28: 10 mg via INTRAVENOUS
  Filled 2019-09-28: qty 10
  Filled 2019-09-28: qty 1

## 2019-09-28 NOTE — Patient Instructions (Signed)
[facility] Discharge Instructions for Patients Receiving Chemotherapy  Today you received the following chemotherapy agents Pembrolizumab (KEYTRUDA), Pemetrexed (ALIMTA) & Carboplatin (PARAPLATIN).  To help prevent nausea and vomiting after your treatment, we encourage you to take your nausea medication as prescribed.   If you develop nausea and vomiting that is not controlled by your nausea medication, call the clinic.   BELOW ARE SYMPTOMS THAT SHOULD BE REPORTED IMMEDIATELY:  *FEVER GREATER THAN 100.5 F  *CHILLS WITH OR WITHOUT FEVER  NAUSEA AND VOMITING THAT IS NOT CONTROLLED WITH YOUR NAUSEA MEDICATION  *UNUSUAL SHORTNESS OF BREATH  *UNUSUAL BRUISING OR BLEEDING  TENDERNESS IN MOUTH AND THROAT WITH OR WITHOUT PRESENCE OF ULCERS  *URINARY PROBLEMS  *BOWEL PROBLEMS  UNUSUAL RASH Items with * indicate a potential emergency and should be followed up as soon as possible.  Feel free to call the clinic should you have any questions or concerns. The clinic phone number is [phone].  Please show the [facility] at check-in to the Emergency Department and triage nurse.  Pembrolizumab injection What is this medicine? PEMBROLIZUMAB (pem broe liz ue mab) is a monoclonal antibody. It is used to treat certain types of cancer. This medicine may be used for other purposes; ask your health care provider or pharmacist if you have questions. COMMON BRAND NAME(S): Keytruda What should I tell my health care provider before I take this medicine? They need to know if you have any of these conditions:  diabetes  immune system problems  inflammatory bowel disease  liver disease  lung or breathing disease  lupus  received or scheduled to receive an organ transplant or a stem-cell transplant that uses donor stem cells  an unusual or allergic reaction to pembrolizumab, other medicines, foods, dyes, or preservatives  pregnant or trying to get  pregnant  breast-feeding How should I use this medicine? This medicine is for infusion into a vein. It is given by a health care professional in a hospital or clinic setting. A special MedGuide will be given to you before each treatment. Be sure to read this information carefully each time. Talk to your pediatrician regarding the use of this medicine in children. While this drug may be prescribed for children as young as 6 months for selected conditions, precautions do apply. Overdosage: If you think you have taken too much of this medicine contact a poison control center or emergency room at once. NOTE: This medicine is only for you. Do not share this medicine with others. What if I miss a dose? It is important not to miss your dose. Call your doctor or health care professional if you are unable to keep an appointment. What may interact with this medicine? Interactions have not been studied. Give your health care provider a list of all the medicines, herbs, non-prescription drugs, or dietary supplements you use. Also tell them if you smoke, drink alcohol, or use illegal drugs. Some items may interact with your medicine. This list may not describe all possible interactions. Give your health care provider a list of all the medicines, herbs, non-prescription drugs, or dietary supplements you use. Also tell them if you smoke, drink alcohol, or use illegal drugs. Some items may interact with your medicine. What should I watch for while using this medicine? Your condition will be monitored carefully while you are receiving this medicine. You may need blood work done while you are taking this medicine. Do not become pregnant while taking this medicine or for 4  months after stopping it. Women should inform their doctor if they wish to become pregnant or think they might be pregnant. There is a potential for serious side effects to an unborn child. Talk to your health care professional or pharmacist for  more information. Do not breast-feed an infant while taking this medicine or for 4 months after the last dose. What side effects may I notice from receiving this medicine? Side effects that you should report to your doctor or health care professional as soon as possible:  allergic reactions like skin rash, itching or hives, swelling of the face, lips, or tongue  bloody or black, tarry  breathing problems  changes in vision  chest pain  chills  confusion  constipation  cough  diarrhea  dizziness or feeling faint or lightheaded  fast or irregular heartbeat  fever  flushing  joint pain  low blood counts - this medicine may decrease the number of white blood cells, red blood cells and platelets. You may be at increased risk for infections and bleeding.  muscle pain  muscle weakness  pain, tingling, numbness in the hands or feet  persistent headache  redness, blistering, peeling or loosening of the skin, including inside the mouth  signs and symptoms of high blood sugar such as dizziness; dry mouth; dry skin; fruity breath; nausea; stomach pain; increased hunger or thirst; increased urination  signs and symptoms of kidney injury like trouble passing urine or change in the amount of urine  signs and symptoms of liver injury like dark urine, light-colored stools, loss of appetite, nausea, right upper belly pain, yellowing of the eyes or skin  sweating  swollen lymph nodes  weight loss Side effects that usually do not require medical attention (report to your doctor or health care professional if they continue or are bothersome):  decreased appetite  hair loss  muscle pain  tiredness This list may not describe all possible side effects. Call your doctor for medical advice about side effects. You may report side effects to FDA at 1-800-FDA-1088. Where should I keep my medicine? This drug is given in a hospital or clinic and will not be stored at home. NOTE:  This sheet is a summary. It may not cover all possible information. If you have questions about this medicine, talk to your doctor, pharmacist, or health care provider.  2020 Elsevier/Gold Standard (2019-01-13 18:07:58)  Pemetrexed injection What is this medicine? PEMETREXED (PEM e TREX ed) is a chemotherapy drug used to treat lung cancers like non-small cell lung cancer and mesothelioma. It may also be used to treat other cancers. This medicine may be used for other purposes; ask your health care provider or pharmacist if you have questions. COMMON BRAND NAME(S): Alimta What should I tell my health care provider before I take this medicine? They need to know if you have any of these conditions:  infection (especially a virus infection such as chickenpox, cold sores, or herpes)  kidney disease  low blood counts, like low white cell, platelet, or red cell counts  lung or breathing disease, like asthma  radiation therapy  an unusual or allergic reaction to pemetrexed, other medicines, foods, dyes, or preservative  pregnant or trying to get pregnant  breast-feeding How should I use this medicine? This drug is given as an infusion into a vein. It is administered in a hospital or clinic by a specially trained health care professional. Talk to your pediatrician regarding the use of this medicine in children. Special care may  be needed. Overdosage: If you think you have taken too much of this medicine contact a poison control center or emergency room at once. NOTE: This medicine is only for you. Do not share this medicine with others. What if I miss a dose? It is important not to miss your dose. Call your doctor or health care professional if you are unable to keep an appointment. What may interact with this medicine? This medicine may interact with the following medications:  Ibuprofen This list may not describe all possible interactions. Give your health care provider a list of all  the medicines, herbs, non-prescription drugs, or dietary supplements you use. Also tell them if you smoke, drink alcohol, or use illegal drugs. Some items may interact with your medicine. What should I watch for while using this medicine? Visit your doctor for checks on your progress. This drug may make you feel generally unwell. This is not uncommon, as chemotherapy can affect healthy cells as well as cancer cells. Report any side effects. Continue your course of treatment even though you feel ill unless your doctor tells you to stop. In some cases, you may be given additional medicines to help with side effects. Follow all directions for their use. Call your doctor or health care professional for advice if you get a fever, chills or sore throat, or other symptoms of a cold or flu. Do not treat yourself. This drug decreases your body's ability to fight infections. Try to avoid being around people who are sick. This medicine may increase your risk to bruise or bleed. Call your doctor or health care professional if you notice any unusual bleeding. Be careful brushing and flossing your teeth or using a toothpick because you may get an infection or bleed more easily. If you have any dental work done, tell your dentist you are receiving this medicine. Avoid taking products that contain aspirin, acetaminophen, ibuprofen, naproxen, or ketoprofen unless instructed by your doctor. These medicines may hide a fever. Call your doctor or health care professional if you get diarrhea or mouth sores. Do not treat yourself. To protect your kidneys, drink water or other fluids as directed while you are taking this medicine. Do not become pregnant while taking this medicine or for 6 months after stopping it. Women should inform their doctor if they wish to become pregnant or think they might be pregnant. Men should not father a child while taking this medicine and for 3 months after stopping it. This may interfere with the  ability to father a child. You should talk to your doctor or health care professional if you are concerned about your fertility. There is a potential for serious side effects to an unborn child. Talk to your health care professional or pharmacist for more information. Do not breast-feed an infant while taking this medicine or for 1 week after stopping it. What side effects may I notice from receiving this medicine? Side effects that you should report to your doctor or health care professional as soon as possible:  allergic reactions like skin rash, itching or hives, swelling of the face, lips, or tongue  breathing problems  redness, blistering, peeling or loosening of the skin, including inside the mouth  signs and symptoms of bleeding such as bloody or black, tarry stools; red or dark-brown urine; spitting up blood or brown material that looks like coffee grounds; red spots on the skin; unusual bruising or bleeding from the eye, gums, or nose  signs and symptoms of infection like  fever or chills; cough; sore throat; pain or trouble passing urine  signs and symptoms of kidney injury like trouble passing urine or change in the amount of urine  signs and symptoms of liver injury like dark yellow or brown urine; general ill feeling or flu-like symptoms; light-colored stools; loss of appetite; nausea; right upper belly pain; unusually weak or tired; yellowing of the eyes or skin Side effects that usually do not require medical attention (report to your doctor or health care professional if they continue or are bothersome):  constipation  mouth sores  nausea, vomiting  unusually weak or tired This list may not describe all possible side effects. Call your doctor for medical advice about side effects. You may report side effects to FDA at 1-800-FDA-1088. Where should I keep my medicine? This drug is given in a hospital or clinic and will not be stored at home. NOTE: This sheet is a summary. It  may not cover all possible information. If you have questions about this medicine, talk to your doctor, pharmacist, or health care provider.  2020 Elsevier/Gold Standard (2017-04-28 16:11:33)  Carboplatin injection What is this medicine? CARBOPLATIN (KAR boe pla tin) is a chemotherapy drug. It targets fast dividing cells, like cancer cells, and causes these cells to die. This medicine is used to treat ovarian cancer and many other cancers. This medicine may be used for other purposes; ask your health care provider or pharmacist if you have questions. COMMON BRAND NAME(S): Paraplatin What should I tell my health care provider before I take this medicine? They need to know if you have any of these conditions:  blood disorders  hearing problems  kidney disease  recent or ongoing radiation therapy  an unusual or allergic reaction to carboplatin, cisplatin, other chemotherapy, other medicines, foods, dyes, or preservatives  pregnant or trying to get pregnant  breast-feeding How should I use this medicine? This drug is usually given as an infusion into a vein. It is administered in a hospital or clinic by a specially trained health care professional. Talk to your pediatrician regarding the use of this medicine in children. Special care may be needed. Overdosage: If you think you have taken too much of this medicine contact a poison control center or emergency room at once. NOTE: This medicine is only for you. Do not share this medicine with others. What if I miss a dose? It is important not to miss a dose. Call your doctor or health care professional if you are unable to keep an appointment. What may interact with this medicine?  medicines for seizures  medicines to increase blood counts like filgrastim, pegfilgrastim, sargramostim  some antibiotics like amikacin, gentamicin, neomycin, streptomycin, tobramycin  vaccines Talk to your doctor or health care professional before taking  any of these medicines:  acetaminophen  aspirin  ibuprofen  ketoprofen  naproxen This list may not describe all possible interactions. Give your health care provider a list of all the medicines, herbs, non-prescription drugs, or dietary supplements you use. Also tell them if you smoke, drink alcohol, or use illegal drugs. Some items may interact with your medicine. What should I watch for while using this medicine? Your condition will be monitored carefully while you are receiving this medicine. You will need important blood work done while you are taking this medicine. This drug may make you feel generally unwell. This is not uncommon, as chemotherapy can affect healthy cells as well as cancer cells. Report any side effects. Continue your course of  treatment even though you feel ill unless your doctor tells you to stop. In some cases, you may be given additional medicines to help with side effects. Follow all directions for their use. Call your doctor or health care professional for advice if you get a fever, chills or sore throat, or other symptoms of a cold or flu. Do not treat yourself. This drug decreases your body's ability to fight infections. Try to avoid being around people who are sick. This medicine may increase your risk to bruise or bleed. Call your doctor or health care professional if you notice any unusual bleeding. Be careful brushing and flossing your teeth or using a toothpick because you may get an infection or bleed more easily. If you have any dental work done, tell your dentist you are receiving this medicine. Avoid taking products that contain aspirin, acetaminophen, ibuprofen, naproxen, or ketoprofen unless instructed by your doctor. These medicines may hide a fever. Do not become pregnant while taking this medicine. Women should inform their doctor if they wish to become pregnant or think they might be pregnant. There is a potential for serious side effects to an unborn  child. Talk to your health care professional or pharmacist for more information. Do not breast-feed an infant while taking this medicine. What side effects may I notice from receiving this medicine? Side effects that you should report to your doctor or health care professional as soon as possible:  allergic reactions like skin rash, itching or hives, swelling of the face, lips, or tongue  signs of infection - fever or chills, cough, sore throat, pain or difficulty passing urine  signs of decreased platelets or bleeding - bruising, pinpoint red spots on the skin, black, tarry stools, nosebleeds  signs of decreased red blood cells - unusually weak or tired, fainting spells, lightheadedness  breathing problems  changes in hearing  changes in vision  chest pain  high blood pressure  low blood counts - This drug may decrease the number of white blood cells, red blood cells and platelets. You may be at increased risk for infections and bleeding.  nausea and vomiting  pain, swelling, redness or irritation at the injection site  pain, tingling, numbness in the hands or feet  problems with balance, talking, walking  trouble passing urine or change in the amount of urine Side effects that usually do not require medical attention (report to your doctor or health care professional if they continue or are bothersome):  hair loss  loss of appetite  metallic taste in the mouth or changes in taste This list may not describe all possible side effects. Call your doctor for medical advice about side effects. You may report side effects to FDA at 1-800-FDA-1088. Where should I keep my medicine? This drug is given in a hospital or clinic and will not be stored at home. NOTE: This sheet is a summary. It may not cover all possible information. If you have questions about this medicine, talk to your doctor, pharmacist, or health care provider.  2020 Elsevier/Gold Standard (2007-06-14  14:38:05)

## 2019-09-28 NOTE — Progress Notes (Signed)
Per Dr. Julien Nordmann: okay to treat with ANC of 1.4

## 2019-09-29 ENCOUNTER — Telehealth: Payer: Self-pay | Admitting: *Deleted

## 2019-10-03 ENCOUNTER — Other Ambulatory Visit: Payer: Self-pay
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
## 2019-10-05 ENCOUNTER — Other Ambulatory Visit: Payer: Self-pay | Admitting: Physician Assistant

## 2019-10-05 ENCOUNTER — Inpatient Hospital Stay: Payer: Medicaid Other

## 2019-10-05 ENCOUNTER — Other Ambulatory Visit: Payer: Self-pay

## 2019-10-05 ENCOUNTER — Inpatient Hospital Stay (HOSPITAL_BASED_OUTPATIENT_CLINIC_OR_DEPARTMENT_OTHER): Payer: Medicaid Other | Admitting: Physician Assistant

## 2019-10-05 ENCOUNTER — Other Ambulatory Visit: Payer: Self-pay | Admitting: Internal Medicine

## 2019-10-05 VITALS — BP 112/80 | HR 86 | Temp 97.8°F | Resp 17 | Ht 66.0 in | Wt 112.1 lb

## 2019-10-05 DIAGNOSIS — D709 Neutropenia, unspecified: Secondary | ICD-10-CM

## 2019-10-05 DIAGNOSIS — Z5112 Encounter for antineoplastic immunotherapy: Secondary | ICD-10-CM | POA: Diagnosis not present

## 2019-10-05 DIAGNOSIS — C3491 Malignant neoplasm of unspecified part of right bronchus or lung: Secondary | ICD-10-CM

## 2019-10-05 LAB — CMP (CANCER CENTER ONLY)
ALT: 40 U/L (ref 0–44)
AST: 49 U/L — ABNORMAL HIGH (ref 15–41)
Albumin: 3.4 g/dL — ABNORMAL LOW (ref 3.5–5.0)
Alkaline Phosphatase: 143 U/L — ABNORMAL HIGH (ref 38–126)
Anion gap: 12 (ref 5–15)
BUN: 6 mg/dL (ref 6–20)
CO2: 23 mmol/L (ref 22–32)
Calcium: 9.4 mg/dL (ref 8.9–10.3)
Chloride: 100 mmol/L (ref 98–111)
Creatinine: 0.7 mg/dL (ref 0.44–1.00)
GFR, Est AFR Am: >60 mL/min (ref >60)
GFR, Estimated: >60 mL/min (ref >60)
Glucose, Bld: 103 mg/dL — ABNORMAL HIGH (ref 70–99)
Potassium: 3.7 mmol/L (ref 3.5–5.1)
Sodium: 135 mmol/L (ref 135–145)
Total Bilirubin: 0.7 mg/dL (ref 0.3–1.2)
Total Protein: 8 g/dL (ref 6.5–8.1)

## 2019-10-05 LAB — CBC WITH DIFFERENTIAL (CANCER CENTER ONLY)
Abs Immature Granulocytes: 0.01 10*3/uL (ref 0.00–0.07)
Basophils Absolute: 0 10*3/uL (ref 0.0–0.1)
Basophils Relative: 1 %
Eosinophils Absolute: 0 10*3/uL (ref 0.0–0.5)
Eosinophils Relative: 0 %
HCT: 37.1 % (ref 36.0–46.0)
Hemoglobin: 12.5 g/dL (ref 12.0–15.0)
Immature Granulocytes: 1 %
Lymphocytes Relative: 55 %
Lymphs Abs: 1 10*3/uL (ref 0.7–4.0)
MCH: 32.8 pg (ref 26.0–34.0)
MCHC: 33.7 g/dL (ref 30.0–36.0)
MCV: 97.4 fL (ref 80.0–100.0)
Monocytes Absolute: 0.1 10*3/uL (ref 0.1–1.0)
Monocytes Relative: 6 %
Neutro Abs: 0.7 10*3/uL — ABNORMAL LOW (ref 1.7–7.7)
Neutrophils Relative %: 37 %
Platelet Count: 173 10*3/uL (ref 150–400)
RBC: 3.81 MIL/uL — ABNORMAL LOW (ref 3.87–5.11)
RDW: 14.5 % (ref 11.5–15.5)
WBC Count: 1.7 10*3/uL — ABNORMAL LOW (ref 4.0–10.5)
nRBC: 0 % (ref 0.0–0.2)

## 2019-10-05 MED ORDER — FILGRASTIM-SNDZ 480 MCG/0.8ML IJ SOSY
PREFILLED_SYRINGE | INTRAMUSCULAR | Status: AC
  Filled 2019-10-05: qty 0.8

## 2019-10-05 MED ORDER — FILGRASTIM-SNDZ 480 MCG/0.8ML IJ SOSY
480.0000 ug | PREFILLED_SYRINGE | Freq: Once | INTRAMUSCULAR | Status: AC
  Administered 2019-10-05: 480 ug via SUBCUTANEOUS

## 2019-10-05 MED ORDER — FILGRASTIM-SNDZ 300 MCG/0.5ML IJ SOSY
PREFILLED_SYRINGE | INTRAMUSCULAR | Status: AC
  Filled 2019-10-05: qty 0.5

## 2019-10-06 ENCOUNTER — Encounter: Payer: Self-pay | Admitting: Pharmacy Technician

## 2019-10-06 NOTE — Progress Notes (Signed)
Patient has been approved for drug assistance by [employer] for [employer]. The enrollment period is from 10/05/19-10/03/20 based on self pay. First DOS covered is TBA.

## 2019-10-07 ENCOUNTER — Other Ambulatory Visit: Payer: Self-pay | Admitting: Internal Medicine

## 2019-10-09 ENCOUNTER — Encounter: Payer: Self-pay | Admitting: *Deleted

## 2019-10-09 ENCOUNTER — Telehealth: Payer: Self-pay | Admitting: Physician Assistant

## 2019-10-09 NOTE — Telephone Encounter (Signed)
I called the patient to make sure that she is aware that she is not due for chemotherapy this week. I discussed that I still want her to come in on 7/22 for lab work. Her next treatment is scheduled for 7/29. Relayed her appointments to her. She also inquired if she can have the COVID vaccine. Discussed she may have this vaccine and I recommended that she receive it on an off week from chemotherapy. She expressed understanding.

## 2019-10-12 ENCOUNTER — Inpatient Hospital Stay: Payer: Medicaid Other

## 2019-10-12 ENCOUNTER — Ambulatory Visit: Payer: Self-pay | Admitting: Physician Assistant

## 2019-10-12 ENCOUNTER — Ambulatory Visit: Payer: Self-pay

## 2019-10-12 ENCOUNTER — Other Ambulatory Visit: Payer: Self-pay

## 2019-10-12 DIAGNOSIS — Z5112 Encounter for antineoplastic immunotherapy: Secondary | ICD-10-CM | POA: Diagnosis not present

## 2019-10-12 DIAGNOSIS — C3491 Malignant neoplasm of unspecified part of right bronchus or lung: Secondary | ICD-10-CM

## 2019-10-12 LAB — CBC WITH DIFFERENTIAL (CANCER CENTER ONLY)
Abs Immature Granulocytes: 0.01 10*3/uL (ref 0.00–0.07)
Basophils Absolute: 0 10*3/uL (ref 0.0–0.1)
Basophils Relative: 0 %
Eosinophils Absolute: 0 10*3/uL (ref 0.0–0.5)
Eosinophils Relative: 0 %
HCT: 36.7 % (ref 36.0–46.0)
Hemoglobin: 12.7 g/dL (ref 12.0–15.0)
Immature Granulocytes: 0 %
Lymphocytes Relative: 43 %
Lymphs Abs: 1.3 10*3/uL (ref 0.7–4.0)
MCH: 31.8 pg (ref 26.0–34.0)
MCHC: 34.6 g/dL (ref 30.0–36.0)
MCV: 92 fL (ref 80.0–100.0)
Monocytes Absolute: 0.5 10*3/uL (ref 0.1–1.0)
Monocytes Relative: 17 %
Neutro Abs: 1.2 10*3/uL — ABNORMAL LOW (ref 1.7–7.7)
Neutrophils Relative %: 40 %
Platelet Count: 179 10*3/uL (ref 150–400)
RBC: 3.99 MIL/uL (ref 3.87–5.11)
RDW: 14.5 % (ref 11.5–15.5)
WBC Count: 3 10*3/uL — ABNORMAL LOW (ref 4.0–10.5)
nRBC: 0 % (ref 0.0–0.2)

## 2019-10-12 LAB — CMP (CANCER CENTER ONLY)
ALT: 31 U/L (ref 0–44)
AST: 33 U/L (ref 15–41)
Albumin: 3.4 g/dL — ABNORMAL LOW (ref 3.5–5.0)
Alkaline Phosphatase: 150 U/L — ABNORMAL HIGH (ref 38–126)
Anion gap: 18 — ABNORMAL HIGH (ref 5–15)
BUN: <4 mg/dL — ABNORMAL LOW (ref 6–20)
CO2: 21 mmol/L — ABNORMAL LOW (ref 22–32)
Calcium: 9.3 mg/dL (ref 8.9–10.3)
Chloride: 103 mmol/L (ref 98–111)
Creatinine: 0.7 mg/dL (ref 0.44–1.00)
GFR, Est AFR Am: >60 mL/min (ref >60)
GFR, Estimated: >60 mL/min (ref >60)
Glucose, Bld: 101 mg/dL — ABNORMAL HIGH (ref 70–99)
Potassium: 3.3 mmol/L — ABNORMAL LOW (ref 3.5–5.1)
Sodium: 142 mmol/L (ref 135–145)
Total Bilirubin: <0.2 mg/dL — ABNORMAL LOW (ref 0.3–1.2)
Total Protein: 7.7 g/dL (ref 6.5–8.1)

## 2019-10-12 LAB — TSH: TSH: 1.902 u[IU]/mL (ref 0.308–3.960)

## 2019-10-17 ENCOUNTER — Other Ambulatory Visit: Payer: Self-pay

## 2019-10-18 NOTE — Progress Notes (Signed)
[place] OFFICE PROGRESS NOTE  System, Pcp Not In No address on file  DIAGNOSIS: stage IV (T2 a, N2, M1 B) non-small cell lung cancer, adenocarcinoma presented with large right upper lobe lung mass in addition to suspicious right paratracheal lymphadenopathy and bilateral pulmonary nodules as well as retroperitoneal lymphadenopathy diagnosed in May 2021. The patient has no actionable mutations and has negative PD-L1 expression on molecular studies by foundation 1.  PRIOR THERAPY: None  CURRENT THERAPY: Systemic chemotherapy with carboplatin for an AUC of 5, Alimta 500 mg/m2, and Keytruda 200 mg IV every 3 weeks. First dose 09/28/2019. Status post 1 cycle.   INTERVAL HISTORY: Brenda [name] 59 y.o. female returns to the clinic today for a follow up visit. The patient is feeling well today without any concerning complaints. She completed her first cycle of treatment tolerated it well without any adverse side effects except she does have some thrombophlebitis in her right forearm. Denies any fever, chills, night sweats, or weight loss. She gained 1 lb. Denies any chest pain or hemoptysis. She reports cough with associated phlegm. She also has some baseline dyspnea on exertion. She states she will feel short of breath if she goes up more than 10-11 steps. Denies any nausea, vomiting, diarrhea, or constipation. Denies any headache or visual changes. She denies any signs or symptoms of infection including skin infections, nasal congestion, sore throat, or dysuria. The patient is here today for evaluation before staring cycle #2.    MEDICAL HISTORY: Past Medical History:  Diagnosis Date  . Breast cancer ([place])   . Depression   . Mental disorder     ALLERGIES:  is allergic to aspirin adult low [aspirin].  MEDICATIONS:  Current Outpatient Medications  Medication Sig Dispense Refill  . calcium carbonate (OSCAL) 1500 (600 Ca) MG TABS tablet Take 600 mg by mouth daily.    . folic acid  (FOLVITE) 1 MG tablet Take 1 tablet (1 mg total) by mouth daily. 30 tablet 4  . HYDROcodone-acetaminophen (NORCO/VICODIN) 5-325 MG tablet Take 1 tablet by mouth every 4 (four) hours as needed. (Patient not taking: Reported on 09/14/2019) 10 tablet 0  . oxyCODONE-acetaminophen (PERCOCET/ROXICET) 5-325 MG tablet Take 1 tablet by mouth every 6 (six) hours as needed for severe pain. 30 tablet 0  . potassium chloride SA (KLOR-CON) 20 MEQ tablet Take 1 tablet (20 mEq total) by mouth 2 (two) times daily. 14 tablet 0  . prochlorperazine (COMPAZINE) 10 MG tablet Take 1 tablet (10 mg total) by mouth every 6 (six) hours as needed for nausea or vomiting. 30 tablet 0   No current facility-administered medications for this visit.    SURGICAL HISTORY:  Past Surgical History:  Procedure Laterality Date  . TUBAL LIGATION    . VIDEO BRONCHOSCOPY WITH ENDOBRONCHIAL NAVIGATION N/A 04/04/2019   Procedure: VIDEO BRONCHOSCOPY WITH ENDOBRONCHIAL NAVIGATION;  Surgeon: Garner Nash, DO;  Location: [facility];  Service: Thoracic;  Laterality: N/A;  . VIDEO BRONCHOSCOPY WITH ENDOBRONCHIAL ULTRASOUND N/A 04/04/2019   Procedure: VIDEO BRONCHOSCOPY WITH ENDOBRONCHIAL ULTRASOUND;  Surgeon: Garner Nash, DO;  Location: [place];  Service: Thoracic;  Laterality: N/A;    REVIEW OF SYSTEMS:   Review of Systems  Constitutional: Negative for appetite change, chills, fatigue, fever and unexpected weight change.  HENT: Negative for mouth sores, nosebleeds, sore throat and trouble swallowing.   Eyes: Negative for eye problems and icterus.  Respiratory: Positive for baseline dyspnea on exertion and mild cough. Negative for hemoptysis and wheezing.  Cardiovascular: Negative for chest pain and leg swelling.  Gastrointestinal: Negative for abdominal pain, constipation, diarrhea, nausea and vomiting.  Genitourinary: Negative for bladder incontinence, difficulty urinating, dysuria, frequency and hematuria.   Musculoskeletal: Negative  for back pain, gait problem, neck pain and neck stiffness.  Skin: Positive for firm vein along right forearm. Neurological: Negative for dizziness, extremity weakness, gait problem, headaches, light-headedness and seizures.  Hematological: Negative for adenopathy. Does not bruise/bleed easily.  Psychiatric/Behavioral: Negative for confusion, depression and sleep disturbance. The patient is not nervous/anxious.   PHYSICAL EXAMINATION:  Blood pressure 114/78, pulse 91, temperature 97.9 F (36.6 C), temperature source Temporal, resp. rate 18, height 5' 6"  (1.676 m), weight 113 lb (51.3 kg), last menstrual period 01/19/2011, SpO2 99 %.  ECOG PERFORMANCE STATUS: 1 - Symptomatic but completely ambulatory  Physical Exam  Constitutional: Oriented to person, place, and time and cachetic appearing female  and in no distress.  HENT:  Head: Normocephalic and atraumatic.  Mouth/Throat: Oropharynx is clear and moist. No oropharyngeal exudate.  Eyes: Conjunctivae are normal. Right eye exhibits no discharge. Left eye exhibits no discharge. No scleral icterus.  Neck: Normal range of motion. Neck supple.  Cardiovascular: Normal rate, regular rhythm, normal heart sounds and intact distal pulses.   Pulmonary/Chest: Effort normal. Wheezing/rhonchi in the lungs bilaterally. No respiratory distress. No wheezes. No rales.  Abdominal: Soft. Bowel sounds are normal. Exhibits no distension and no mass. There is no tenderness.  Musculoskeletal: Normal range of motion. Exhibits no edema.  Lymphadenopathy:    No cervical adenopathy.  Neurological: Alert and oriented to person, place, and time. Exhibits normal muscle tone. Gait normal. Coordination normal.  Skin: Positive for thrombophlebitis right forearm. Skin is warm and dry. No rash noted. Not diaphoretic. No erythema. No pallor.  Psychiatric: Mood, memory and judgment normal.  Vitals reviewed.  LABORATORY DATA: Lab Results  Component Value Date   WBC 3.0 (L)  10/19/2019   HGB 12.0 10/19/2019   HCT 34.6 (L) 10/19/2019   MCV 92.5 10/19/2019   PLT 113 (L) 10/19/2019      Chemistry      Component Value Date/Time   NA 132 (L) 10/19/2019 0737   K 2.7 (LL) 10/19/2019 0737   CL 96 (L) 10/19/2019 0737   CO2 23 10/19/2019 0737   BUN <4 (L) 10/19/2019 0737   CREATININE 0.65 10/19/2019 0737      Component Value Date/Time   CALCIUM 9.1 10/19/2019 0737   ALKPHOS 155 (H) 10/19/2019 0737   AST 74 (H) 10/19/2019 0737   ALT 44 10/19/2019 0737   BILITOT 0.6 10/19/2019 0737       RADIOGRAPHIC STUDIES:  No results found.   ASSESSMENT/PLAN:  This is a very pleasant 59 year old African-American female recently diagnosed with a stage IV (T2 a, N2, M1 B) non-small cell lung cancer, adenocarcinoma presented with large right upper lobe lung mass in addition to suspicious right paratracheal lymphadenopathy and bilateral pulmonary nodules as well as retroperitoneal lymphadenopathy diagnosed in May 2021. The patient has no actionable mutations and has negative PD-L1 expression on molecular studies by foundation 1.  The patient is currently undergoing palliative systemic chemotherapy with carboplatin for AUC of 5, Alimta 500 mg/M2 and Keytruda 200 mg IV every 3 weeks. She is status post 1 cycle. She will have udenyca starting from cycle #2 and will return to the clinic on 7/31 for this injection.   Labs were reviewed. Discussed her labs with Dr. Julien Nordmann. Recommend that she proceed with  cycle #2 today as scheduled. Her ANC is 1.1. Dr. Julien Nordmann will keep her treatment at the same dose. She has been approved for [employer]   We will see her back for a follow up visit in 3 weeks for evaluation before starting cycle #3.   The patient's potassium is low today. She will receive 20 meq in IVF today. Additionally, I sent a prescription for 20 meq BID for 7 days to her pharmacy. I discussed the risks for arrhythmia with hypokalemia and the importance of taking this  medication as prescribed. She will pick it up from the pharmacy after her appointment today.   I discussed a port-a-cath with the patient today given her thrombophlebitis. She would like to think about it at this time but I encouraged her to discuss with her nurse in the infusion room today. If she changes her mind, I am happy to place the order.   The patient was advised to call immediately if she has any concerning symptoms in the interval. The patient voices understanding of current disease status and treatment options and is in agreement with the current care plan. All questions were answered. The patient knows to call the clinic with any problems, questions or concerns. We can certainly see the patient much sooner if necessary     No orders of the defined types were placed in this encounter.    Oliva Montecalvo L Aquila Delaughter, PA-C 10/19/19

## 2019-10-19 ENCOUNTER — Other Ambulatory Visit: Payer: Self-pay | Admitting: Physician Assistant

## 2019-10-19 ENCOUNTER — Inpatient Hospital Stay (HOSPITAL_BASED_OUTPATIENT_CLINIC_OR_DEPARTMENT_OTHER): Payer: Medicaid Other | Admitting: Physician Assistant

## 2019-10-19 ENCOUNTER — Inpatient Hospital Stay: Payer: Medicaid Other

## 2019-10-19 ENCOUNTER — Other Ambulatory Visit: Payer: Self-pay

## 2019-10-19 VITALS — BP 114/78 | HR 91 | Temp 97.9°F | Resp 18 | Ht 66.0 in | Wt 113.0 lb

## 2019-10-19 DIAGNOSIS — C3491 Malignant neoplasm of unspecified part of right bronchus or lung: Secondary | ICD-10-CM

## 2019-10-19 DIAGNOSIS — D709 Neutropenia, unspecified: Secondary | ICD-10-CM

## 2019-10-19 DIAGNOSIS — Z5111 Encounter for antineoplastic chemotherapy: Secondary | ICD-10-CM

## 2019-10-19 DIAGNOSIS — Z5112 Encounter for antineoplastic immunotherapy: Secondary | ICD-10-CM

## 2019-10-19 DIAGNOSIS — E876 Hypokalemia: Secondary | ICD-10-CM

## 2019-10-19 LAB — CBC WITH DIFFERENTIAL (CANCER CENTER ONLY)
Abs Immature Granulocytes: 0.01 10*3/uL (ref 0.00–0.07)
Basophils Absolute: 0 10*3/uL (ref 0.0–0.1)
Basophils Relative: 1 %
Eosinophils Absolute: 0 10*3/uL (ref 0.0–0.5)
Eosinophils Relative: 0 %
HCT: 34.6 % — ABNORMAL LOW (ref 36.0–46.0)
Hemoglobin: 12 g/dL (ref 12.0–15.0)
Immature Granulocytes: 0 %
Lymphocytes Relative: 53 %
Lymphs Abs: 1.5 10*3/uL (ref 0.7–4.0)
MCH: 32.1 pg (ref 26.0–34.0)
MCHC: 34.7 g/dL (ref 30.0–36.0)
MCV: 92.5 fL (ref 80.0–100.0)
Monocytes Absolute: 0.3 10*3/uL (ref 0.1–1.0)
Monocytes Relative: 11 %
Neutro Abs: 1.1 10*3/uL — ABNORMAL LOW (ref 1.7–7.7)
Neutrophils Relative %: 35 %
Platelet Count: 113 10*3/uL — ABNORMAL LOW (ref 150–400)
RBC: 3.74 MIL/uL — ABNORMAL LOW (ref 3.87–5.11)
RDW: 14.1 % (ref 11.5–15.5)
WBC Count: 3 10*3/uL — ABNORMAL LOW (ref 4.0–10.5)
nRBC: 0 % (ref 0.0–0.2)

## 2019-10-19 LAB — CMP (CANCER CENTER ONLY)
ALT: 44 U/L (ref 0–44)
AST: 74 U/L — ABNORMAL HIGH (ref 15–41)
Albumin: 3.2 g/dL — ABNORMAL LOW (ref 3.5–5.0)
Alkaline Phosphatase: 155 U/L — ABNORMAL HIGH (ref 38–126)
Anion gap: 13 (ref 5–15)
BUN: <4 mg/dL — ABNORMAL LOW (ref 6–20)
CO2: 23 mmol/L (ref 22–32)
Calcium: 9.1 mg/dL (ref 8.9–10.3)
Chloride: 96 mmol/L — ABNORMAL LOW (ref 98–111)
Creatinine: 0.65 mg/dL (ref 0.44–1.00)
GFR, Est AFR Am: >60 mL/min (ref >60)
GFR, Estimated: >60 mL/min (ref >60)
Glucose, Bld: 88 mg/dL (ref 70–99)
Potassium: 2.7 mmol/L — CL (ref 3.5–5.1)
Sodium: 132 mmol/L — ABNORMAL LOW (ref 135–145)
Total Bilirubin: 0.6 mg/dL (ref 0.3–1.2)
Total Protein: 6.9 g/dL (ref 6.5–8.1)

## 2019-10-19 MED ORDER — POTASSIUM CHLORIDE CRYS ER 20 MEQ PO TBCR: 20.0000 meq | EXTENDED_RELEASE_TABLET | Freq: Two times a day (BID) | ORAL | 0 refills | Status: DC

## 2019-10-19 MED ORDER — SODIUM CHLORIDE 0.9 % IV SOLN
Freq: Once | INTRAVENOUS | Status: AC
  Filled 2019-10-19: qty 250

## 2019-10-19 MED ORDER — SODIUM CHLORIDE 0.9 % IV SOLN
Freq: Once | INTRAVENOUS | Status: AC
  Filled 2019-10-19: qty 1000

## 2019-10-19 MED ORDER — SODIUM CHLORIDE 0.9 % IV SOLN
200.0000 mg | Freq: Once | INTRAVENOUS | Status: AC
  Administered 2019-10-19: 200 mg via INTRAVENOUS
  Filled 2019-10-19: qty 8

## 2019-10-19 MED ORDER — SODIUM CHLORIDE 0.9 % IV SOLN
10.0000 mg | Freq: Once | INTRAVENOUS | Status: AC
  Administered 2019-10-19: 10 mg via INTRAVENOUS
  Filled 2019-10-19: qty 10

## 2019-10-19 MED ORDER — PALONOSETRON HCL INJECTION 0.25 MG/5ML
INTRAVENOUS | Status: AC
  Filled 2019-10-19: qty 5

## 2019-10-19 MED ORDER — SODIUM CHLORIDE 0.9 % IV SOLN
150.0000 mg | Freq: Once | INTRAVENOUS | Status: AC
  Administered 2019-10-19: 150 mg via INTRAVENOUS
  Filled 2019-10-19: qty 150

## 2019-10-19 MED ORDER — PALONOSETRON HCL INJECTION 0.25 MG/5ML
0.2500 mg | Freq: Once | INTRAVENOUS | Status: AC
  Administered 2019-10-19: 0.25 mg via INTRAVENOUS

## 2019-10-19 MED ORDER — LIDOCAINE-PRILOCAINE 2.5-2.5 % EX CREA: 1.0000 "application " | TOPICAL_CREAM | CUTANEOUS | 2 refills | Status: DC | PRN

## 2019-10-19 MED ORDER — SODIUM CHLORIDE 0.9 % IV SOLN
430.0000 mg | Freq: Once | INTRAVENOUS | Status: AC
  Administered 2019-10-19: 430 mg via INTRAVENOUS
  Filled 2019-10-19: qty 43

## 2019-10-19 MED ORDER — SODIUM CHLORIDE 0.9 % IV SOLN
500.0000 mg/m2 | Freq: Once | INTRAVENOUS | Status: AC
  Administered 2019-10-19: 800 mg via INTRAVENOUS
  Filled 2019-10-19: qty 20

## 2019-10-19 MED FILL — POTASSIUM CHLORIDE CRYS ER: 20 | 7 days supply | Qty: 14 | Fill #0

## 2019-10-19 MED FILL — LIDOCAINE-PRILOCAINE 2.5-2.: 2.5-2.5 | 10 days supply | Qty: 30 | Fill #0

## 2019-10-19 NOTE — Patient Instructions (Signed)
[facility] Discharge Instructions for Patients Receiving Chemotherapy  Today you received the following chemotherapy agents Pembrolizumab (KEYTRUDA), Pemetrexed (ALIMTA) & Carboplatin (PARAPLATIN).  To help prevent nausea and vomiting after your treatment, we encourage you to take your nausea medication as prescribed.   If you develop nausea and vomiting that is not controlled by your nausea medication, call the clinic.   BELOW ARE SYMPTOMS THAT SHOULD BE REPORTED IMMEDIATELY:  *FEVER GREATER THAN 100.5 F  *CHILLS WITH OR WITHOUT FEVER  NAUSEA AND VOMITING THAT IS NOT CONTROLLED WITH YOUR NAUSEA MEDICATION  *UNUSUAL SHORTNESS OF BREATH  *UNUSUAL BRUISING OR BLEEDING  TENDERNESS IN MOUTH AND THROAT WITH OR WITHOUT PRESENCE OF ULCERS  *URINARY PROBLEMS  *BOWEL PROBLEMS  UNUSUAL RASH Items with * indicate a potential emergency and should be followed up as soon as possible.  Feel free to call the clinic should you have any questions or concerns. The clinic phone number is [phone].  Please show the [facility] at check-in to the Emergency Department and triage nurse.  Pembrolizumab injection What is this medicine? PEMBROLIZUMAB (pem broe liz ue mab) is a monoclonal antibody. It is used to treat certain types of cancer. This medicine may be used for other purposes; ask your health care provider or pharmacist if you have questions. COMMON BRAND NAME(S): Keytruda What should I tell my health care provider before I take this medicine? They need to know if you have any of these conditions:  diabetes  immune system problems  inflammatory bowel disease  liver disease  lung or breathing disease  lupus  received or scheduled to receive an organ transplant or a stem-cell transplant that uses donor stem cells  an unusual or allergic reaction to pembrolizumab, other medicines, foods, dyes, or preservatives  pregnant or trying to get  pregnant  breast-feeding How should I use this medicine? This medicine is for infusion into a vein. It is given by a health care professional in a hospital or clinic setting. A special MedGuide will be given to you before each treatment. Be sure to read this information carefully each time. Talk to your pediatrician regarding the use of this medicine in children. While this drug may be prescribed for children as young as 6 months for selected conditions, precautions do apply. Overdosage: If you think you have taken too much of this medicine contact a poison control center or emergency room at once. NOTE: This medicine is only for you. Do not share this medicine with others. What if I miss a dose? It is important not to miss your dose. Call your doctor or health care professional if you are unable to keep an appointment. What may interact with this medicine? Interactions have not been studied. Give your health care provider a list of all the medicines, herbs, non-prescription drugs, or dietary supplements you use. Also tell them if you smoke, drink alcohol, or use illegal drugs. Some items may interact with your medicine. This list may not describe all possible interactions. Give your health care provider a list of all the medicines, herbs, non-prescription drugs, or dietary supplements you use. Also tell them if you smoke, drink alcohol, or use illegal drugs. Some items may interact with your medicine. What should I watch for while using this medicine? Your condition will be monitored carefully while you are receiving this medicine. You may need blood work done while you are taking this medicine. Do not become pregnant while taking this medicine or for 4  months after stopping it. Women should inform their doctor if they wish to become pregnant or think they might be pregnant. There is a potential for serious side effects to an unborn child. Talk to your health care professional or pharmacist for  more information. Do not breast-feed an infant while taking this medicine or for 4 months after the last dose. What side effects may I notice from receiving this medicine? Side effects that you should report to your doctor or health care professional as soon as possible:  allergic reactions like skin rash, itching or hives, swelling of the face, lips, or tongue  bloody or black, tarry  breathing problems  changes in vision  chest pain  chills  confusion  constipation  cough  diarrhea  dizziness or feeling faint or lightheaded  fast or irregular heartbeat  fever  flushing  joint pain  low blood counts - this medicine may decrease the number of white blood cells, red blood cells and platelets. You may be at increased risk for infections and bleeding.  muscle pain  muscle weakness  pain, tingling, numbness in the hands or feet  persistent headache  redness, blistering, peeling or loosening of the skin, including inside the mouth  signs and symptoms of high blood sugar such as dizziness; dry mouth; dry skin; fruity breath; nausea; stomach pain; increased hunger or thirst; increased urination  signs and symptoms of kidney injury like trouble passing urine or change in the amount of urine  signs and symptoms of liver injury like dark urine, light-colored stools, loss of appetite, nausea, right upper belly pain, yellowing of the eyes or skin  sweating  swollen lymph nodes  weight loss Side effects that usually do not require medical attention (report to your doctor or health care professional if they continue or are bothersome):  decreased appetite  hair loss  muscle pain  tiredness This list may not describe all possible side effects. Call your doctor for medical advice about side effects. You may report side effects to FDA at 1-800-FDA-1088. Where should I keep my medicine? This drug is given in a hospital or clinic and will not be stored at home. NOTE:  This sheet is a summary. It may not cover all possible information. If you have questions about this medicine, talk to your doctor, pharmacist, or health care provider.  2020 Elsevier/Gold Standard (2019-01-13 18:07:58)  Pemetrexed injection What is this medicine? PEMETREXED (PEM e TREX ed) is a chemotherapy drug used to treat lung cancers like non-small cell lung cancer and mesothelioma. It may also be used to treat other cancers. This medicine may be used for other purposes; ask your health care provider or pharmacist if you have questions. COMMON BRAND NAME(S): Alimta What should I tell my health care provider before I take this medicine? They need to know if you have any of these conditions:  infection (especially a virus infection such as chickenpox, cold sores, or herpes)  kidney disease  low blood counts, like low white cell, platelet, or red cell counts  lung or breathing disease, like asthma  radiation therapy  an unusual or allergic reaction to pemetrexed, other medicines, foods, dyes, or preservative  pregnant or trying to get pregnant  breast-feeding How should I use this medicine? This drug is given as an infusion into a vein. It is administered in a hospital or clinic by a specially trained health care professional. Talk to your pediatrician regarding the use of this medicine in children. Special care may  be needed. Overdosage: If you think you have taken too much of this medicine contact a poison control center or emergency room at once. NOTE: This medicine is only for you. Do not share this medicine with others. What if I miss a dose? It is important not to miss your dose. Call your doctor or health care professional if you are unable to keep an appointment. What may interact with this medicine? This medicine may interact with the following medications:  Ibuprofen This list may not describe all possible interactions. Give your health care provider a list of all  the medicines, herbs, non-prescription drugs, or dietary supplements you use. Also tell them if you smoke, drink alcohol, or use illegal drugs. Some items may interact with your medicine. What should I watch for while using this medicine? Visit your doctor for checks on your progress. This drug may make you feel generally unwell. This is not uncommon, as chemotherapy can affect healthy cells as well as cancer cells. Report any side effects. Continue your course of treatment even though you feel ill unless your doctor tells you to stop. In some cases, you may be given additional medicines to help with side effects. Follow all directions for their use. Call your doctor or health care professional for advice if you get a fever, chills or sore throat, or other symptoms of a cold or flu. Do not treat yourself. This drug decreases your body's ability to fight infections. Try to avoid being around people who are sick. This medicine may increase your risk to bruise or bleed. Call your doctor or health care professional if you notice any unusual bleeding. Be careful brushing and flossing your teeth or using a toothpick because you may get an infection or bleed more easily. If you have any dental work done, tell your dentist you are receiving this medicine. Avoid taking products that contain aspirin, acetaminophen, ibuprofen, naproxen, or ketoprofen unless instructed by your doctor. These medicines may hide a fever. Call your doctor or health care professional if you get diarrhea or mouth sores. Do not treat yourself. To protect your kidneys, drink water or other fluids as directed while you are taking this medicine. Do not become pregnant while taking this medicine or for 6 months after stopping it. Women should inform their doctor if they wish to become pregnant or think they might be pregnant. Men should not father a child while taking this medicine and for 3 months after stopping it. This may interfere with the  ability to father a child. You should talk to your doctor or health care professional if you are concerned about your fertility. There is a potential for serious side effects to an unborn child. Talk to your health care professional or pharmacist for more information. Do not breast-feed an infant while taking this medicine or for 1 week after stopping it. What side effects may I notice from receiving this medicine? Side effects that you should report to your doctor or health care professional as soon as possible:  allergic reactions like skin rash, itching or hives, swelling of the face, lips, or tongue  breathing problems  redness, blistering, peeling or loosening of the skin, including inside the mouth  signs and symptoms of bleeding such as bloody or black, tarry stools; red or dark-brown urine; spitting up blood or brown material that looks like coffee grounds; red spots on the skin; unusual bruising or bleeding from the eye, gums, or nose  signs and symptoms of infection like  fever or chills; cough; sore throat; pain or trouble passing urine  signs and symptoms of kidney injury like trouble passing urine or change in the amount of urine  signs and symptoms of liver injury like dark yellow or brown urine; general ill feeling or flu-like symptoms; light-colored stools; loss of appetite; nausea; right upper belly pain; unusually weak or tired; yellowing of the eyes or skin Side effects that usually do not require medical attention (report to your doctor or health care professional if they continue or are bothersome):  constipation  mouth sores  nausea, vomiting  unusually weak or tired This list may not describe all possible side effects. Call your doctor for medical advice about side effects. You may report side effects to FDA at 1-800-FDA-1088. Where should I keep my medicine? This drug is given in a hospital or clinic and will not be stored at home. NOTE: This sheet is a summary. It  may not cover all possible information. If you have questions about this medicine, talk to your doctor, pharmacist, or health care provider.  2020 Elsevier/Gold Standard (2017-04-28 16:11:33)  Carboplatin injection What is this medicine? CARBOPLATIN (KAR boe pla tin) is a chemotherapy drug. It targets fast dividing cells, like cancer cells, and causes these cells to die. This medicine is used to treat ovarian cancer and many other cancers. This medicine may be used for other purposes; ask your health care provider or pharmacist if you have questions. COMMON BRAND NAME(S): Paraplatin What should I tell my health care provider before I take this medicine? They need to know if you have any of these conditions:  blood disorders  hearing problems  kidney disease  recent or ongoing radiation therapy  an unusual or allergic reaction to carboplatin, cisplatin, other chemotherapy, other medicines, foods, dyes, or preservatives  pregnant or trying to get pregnant  breast-feeding How should I use this medicine? This drug is usually given as an infusion into a vein. It is administered in a hospital or clinic by a specially trained health care professional. Talk to your pediatrician regarding the use of this medicine in children. Special care may be needed. Overdosage: If you think you have taken too much of this medicine contact a poison control center or emergency room at once. NOTE: This medicine is only for you. Do not share this medicine with others. What if I miss a dose? It is important not to miss a dose. Call your doctor or health care professional if you are unable to keep an appointment. What may interact with this medicine?  medicines for seizures  medicines to increase blood counts like filgrastim, pegfilgrastim, sargramostim  some antibiotics like amikacin, gentamicin, neomycin, streptomycin, tobramycin  vaccines Talk to your doctor or health care professional before taking  any of these medicines:  acetaminophen  aspirin  ibuprofen  ketoprofen  naproxen This list may not describe all possible interactions. Give your health care provider a list of all the medicines, herbs, non-prescription drugs, or dietary supplements you use. Also tell them if you smoke, drink alcohol, or use illegal drugs. Some items may interact with your medicine. What should I watch for while using this medicine? Your condition will be monitored carefully while you are receiving this medicine. You will need important blood work done while you are taking this medicine. This drug may make you feel generally unwell. This is not uncommon, as chemotherapy can affect healthy cells as well as cancer cells. Report any side effects. Continue your course of  treatment even though you feel ill unless your doctor tells you to stop. In some cases, you may be given additional medicines to help with side effects. Follow all directions for their use. Call your doctor or health care professional for advice if you get a fever, chills or sore throat, or other symptoms of a cold or flu. Do not treat yourself. This drug decreases your body's ability to fight infections. Try to avoid being around people who are sick. This medicine may increase your risk to bruise or bleed. Call your doctor or health care professional if you notice any unusual bleeding. Be careful brushing and flossing your teeth or using a toothpick because you may get an infection or bleed more easily. If you have any dental work done, tell your dentist you are receiving this medicine. Avoid taking products that contain aspirin, acetaminophen, ibuprofen, naproxen, or ketoprofen unless instructed by your doctor. These medicines may hide a fever. Do not become pregnant while taking this medicine. Women should inform their doctor if they wish to become pregnant or think they might be pregnant. There is a potential for serious side effects to an unborn  child. Talk to your health care professional or pharmacist for more information. Do not breast-feed an infant while taking this medicine. What side effects may I notice from receiving this medicine? Side effects that you should report to your doctor or health care professional as soon as possible:  allergic reactions like skin rash, itching or hives, swelling of the face, lips, or tongue  signs of infection - fever or chills, cough, sore throat, pain or difficulty passing urine  signs of decreased platelets or bleeding - bruising, pinpoint red spots on the skin, black, tarry stools, nosebleeds  signs of decreased red blood cells - unusually weak or tired, fainting spells, lightheadedness  breathing problems  changes in hearing  changes in vision  chest pain  high blood pressure  low blood counts - This drug may decrease the number of white blood cells, red blood cells and platelets. You may be at increased risk for infections and bleeding.  nausea and vomiting  pain, swelling, redness or irritation at the injection site  pain, tingling, numbness in the hands or feet  problems with balance, talking, walking  trouble passing urine or change in the amount of urine Side effects that usually do not require medical attention (report to your doctor or health care professional if they continue or are bothersome):  hair loss  loss of appetite  metallic taste in the mouth or changes in taste This list may not describe all possible side effects. Call your doctor for medical advice about side effects. You may report side effects to FDA at 1-800-FDA-1088. Where should I keep my medicine? This drug is given in a hospital or clinic and will not be stored at home. NOTE: This sheet is a summary. It may not cover all possible information. If you have questions about this medicine, talk to your doctor, pharmacist, or health care provider.  2020 Elsevier/Gold Standard (2007-06-14  14:38:05)

## 2019-10-19 NOTE — Progress Notes (Signed)
Okay to treat with ANC 1.1 per Cassie PA.

## 2019-10-21 ENCOUNTER — Inpatient Hospital Stay: Payer: Medicaid Other

## 2019-10-21 ENCOUNTER — Other Ambulatory Visit: Payer: Self-pay

## 2019-10-21 VITALS — BP 93/65 | HR 96 | Temp 97.7°F | Resp 18

## 2019-10-21 DIAGNOSIS — Z5112 Encounter for antineoplastic immunotherapy: Secondary | ICD-10-CM | POA: Diagnosis not present

## 2019-10-21 DIAGNOSIS — C3491 Malignant neoplasm of unspecified part of right bronchus or lung: Secondary | ICD-10-CM

## 2019-10-21 MED ORDER — PEGFILGRASTIM-CBQV 6 MG/0.6ML ~~LOC~~ SOSY
6.0000 mg | PREFILLED_SYRINGE | Freq: Once | SUBCUTANEOUS | Status: AC
  Administered 2019-10-21: 6 mg via SUBCUTANEOUS

## 2019-10-21 NOTE — Patient Instructions (Signed)

## 2019-10-24 ENCOUNTER — Other Ambulatory Visit: Payer: Self-pay

## 2019-10-26 ENCOUNTER — Other Ambulatory Visit: Payer: Self-pay

## 2019-10-26 ENCOUNTER — Telehealth: Payer: Self-pay | Admitting: *Deleted

## 2019-10-26 ENCOUNTER — Other Ambulatory Visit: Payer: Self-pay | Admitting: Physician Assistant

## 2019-10-26 ENCOUNTER — Inpatient Hospital Stay: Payer: Medicaid Other | Attending: Internal Medicine

## 2019-10-26 DIAGNOSIS — Z5112 Encounter for antineoplastic immunotherapy: Secondary | ICD-10-CM | POA: Diagnosis present

## 2019-10-26 DIAGNOSIS — C3491 Malignant neoplasm of unspecified part of right bronchus or lung: Secondary | ICD-10-CM

## 2019-10-26 DIAGNOSIS — Z5189 Encounter for other specified aftercare: Secondary | ICD-10-CM | POA: Diagnosis not present

## 2019-10-26 DIAGNOSIS — Z79899 Other long term (current) drug therapy: Secondary | ICD-10-CM | POA: Diagnosis not present

## 2019-10-26 DIAGNOSIS — E876 Hypokalemia: Secondary | ICD-10-CM

## 2019-10-26 DIAGNOSIS — C3411 Malignant neoplasm of upper lobe, right bronchus or lung: Secondary | ICD-10-CM | POA: Diagnosis present

## 2019-10-26 DIAGNOSIS — R590 Localized enlarged lymph nodes: Secondary | ICD-10-CM | POA: Insufficient documentation

## 2019-10-26 DIAGNOSIS — R0609 Other forms of dyspnea: Secondary | ICD-10-CM | POA: Insufficient documentation

## 2019-10-26 DIAGNOSIS — Z5111 Encounter for antineoplastic chemotherapy: Secondary | ICD-10-CM | POA: Diagnosis present

## 2019-10-26 LAB — CBC WITH DIFFERENTIAL (CANCER CENTER ONLY)
Abs Immature Granulocytes: 0.21 10*3/uL — ABNORMAL HIGH (ref 0.00–0.07)
Basophils Absolute: 0.1 10*3/uL (ref 0.0–0.1)
Basophils Relative: 1 %
Eosinophils Absolute: 0 10*3/uL (ref 0.0–0.5)
Eosinophils Relative: 0 %
HCT: 28.5 % — ABNORMAL LOW (ref 36.0–46.0)
Hemoglobin: 9.9 g/dL — ABNORMAL LOW (ref 12.0–15.0)
Immature Granulocytes: 3 %
Lymphocytes Relative: 12 %
Lymphs Abs: 1 10*3/uL (ref 0.7–4.0)
MCH: 32.8 pg (ref 26.0–34.0)
MCHC: 34.7 g/dL (ref 30.0–36.0)
MCV: 94.4 fL (ref 80.0–100.0)
Monocytes Absolute: 0.8 10*3/uL (ref 0.1–1.0)
Monocytes Relative: 10 %
Neutro Abs: 6.2 10*3/uL (ref 1.7–7.7)
Neutrophils Relative %: 74 %
Platelet Count: 71 10*3/uL — ABNORMAL LOW (ref 150–400)
RBC: 3.02 MIL/uL — ABNORMAL LOW (ref 3.87–5.11)
RDW: 15.1 % (ref 11.5–15.5)
WBC Count: 8.3 10*3/uL (ref 4.0–10.5)
nRBC: 0 % (ref 0.0–0.2)

## 2019-10-26 LAB — CMP (CANCER CENTER ONLY)
ALT: 20 U/L (ref 0–44)
AST: 26 U/L (ref 15–41)
Albumin: 3 g/dL — ABNORMAL LOW (ref 3.5–5.0)
Alkaline Phosphatase: 169 U/L — ABNORMAL HIGH (ref 38–126)
Anion gap: 12 (ref 5–15)
BUN: 4 mg/dL — ABNORMAL LOW (ref 6–20)
CO2: 27 mmol/L (ref 22–32)
Calcium: 9.4 mg/dL (ref 8.9–10.3)
Chloride: 97 mmol/L — ABNORMAL LOW (ref 98–111)
Creatinine: 0.65 mg/dL (ref 0.44–1.00)
GFR, Est AFR Am: >60 mL/min (ref >60)
GFR, Estimated: >60 mL/min (ref >60)
Glucose, Bld: 96 mg/dL (ref 70–99)
Potassium: 3.1 mmol/L — ABNORMAL LOW (ref 3.5–5.1)
Sodium: 136 mmol/L (ref 135–145)
Total Bilirubin: 0.5 mg/dL (ref 0.3–1.2)
Total Protein: 7.2 g/dL (ref 6.5–8.1)

## 2019-10-26 MED ORDER — POTASSIUM CHLORIDE CRYS ER 20 MEQ PO TBCR: 20.0000 meq | EXTENDED_RELEASE_TABLET | Freq: Every day | ORAL | 0 refills | Status: DC

## 2019-10-26 MED FILL — POTASSIUM CHLORIDE CRYS ER: 20 | 7 days supply | Qty: 7 | Fill #0

## 2019-10-26 NOTE — Telephone Encounter (Signed)
Called patient to review her recent Potassium level.  Advised that per Cassie she needs to take Potassium supplement longer and a new Rx was called into her pharmacy.  No further questions.

## 2019-11-02 ENCOUNTER — Other Ambulatory Visit: Payer: Self-pay | Admitting: Student

## 2019-11-02 ENCOUNTER — Ambulatory Visit: Payer: Self-pay

## 2019-11-02 ENCOUNTER — Other Ambulatory Visit: Payer: Self-pay

## 2019-11-02 ENCOUNTER — Other Ambulatory Visit: Payer: Self-pay | Admitting: Radiology

## 2019-11-02 ENCOUNTER — Ambulatory Visit: Payer: Self-pay | Admitting: Physician Assistant

## 2019-11-02 ENCOUNTER — Inpatient Hospital Stay: Payer: Medicaid Other

## 2019-11-02 DIAGNOSIS — Z5112 Encounter for antineoplastic immunotherapy: Secondary | ICD-10-CM | POA: Diagnosis not present

## 2019-11-02 DIAGNOSIS — C3491 Malignant neoplasm of unspecified part of right bronchus or lung: Secondary | ICD-10-CM

## 2019-11-02 LAB — CBC WITH DIFFERENTIAL (CANCER CENTER ONLY)
Abs Immature Granulocytes: 0.15 10*3/uL — ABNORMAL HIGH (ref 0.00–0.07)
Basophils Absolute: 0 10*3/uL (ref 0.0–0.1)
Basophils Relative: 0 %
Eosinophils Absolute: 0 10*3/uL (ref 0.0–0.5)
Eosinophils Relative: 0 %
HCT: 30 % — ABNORMAL LOW (ref 36.0–46.0)
Hemoglobin: 9.9 g/dL — ABNORMAL LOW (ref 12.0–15.0)
Immature Granulocytes: 2 %
Lymphocytes Relative: 24 %
Lymphs Abs: 1.7 10*3/uL (ref 0.7–4.0)
MCH: 32.9 pg (ref 26.0–34.0)
MCHC: 33 g/dL (ref 30.0–36.0)
MCV: 99.7 fL (ref 80.0–100.0)
Monocytes Absolute: 0.8 10*3/uL (ref 0.1–1.0)
Monocytes Relative: 11 %
Neutro Abs: 4.6 10*3/uL (ref 1.7–7.7)
Neutrophils Relative %: 63 %
Platelet Count: 162 10*3/uL (ref 150–400)
RBC: 3.01 MIL/uL — ABNORMAL LOW (ref 3.87–5.11)
RDW: 15.9 % — ABNORMAL HIGH (ref 11.5–15.5)
WBC Count: 7.3 10*3/uL (ref 4.0–10.5)
nRBC: 0.3 % — ABNORMAL HIGH (ref 0.0–0.2)

## 2019-11-02 LAB — CMP (CANCER CENTER ONLY)
ALT: 19 U/L (ref 0–44)
AST: 21 U/L (ref 15–41)
Albumin: 3 g/dL — ABNORMAL LOW (ref 3.5–5.0)
Alkaline Phosphatase: 181 U/L — ABNORMAL HIGH (ref 38–126)
Anion gap: 8 (ref 5–15)
BUN: <4 mg/dL — ABNORMAL LOW (ref 6–20)
CO2: 22 mmol/L (ref 22–32)
Calcium: 9.4 mg/dL (ref 8.9–10.3)
Chloride: 107 mmol/L (ref 98–111)
Creatinine: 0.71 mg/dL (ref 0.44–1.00)
GFR, Est AFR Am: >60 mL/min (ref >60)
GFR, Estimated: >60 mL/min (ref >60)
Glucose, Bld: 84 mg/dL (ref 70–99)
Potassium: 4.2 mmol/L (ref 3.5–5.1)
Sodium: 137 mmol/L (ref 135–145)
Total Bilirubin: <0.2 mg/dL — ABNORMAL LOW (ref 0.3–1.2)
Total Protein: 7.3 g/dL (ref 6.5–8.1)

## 2019-11-02 LAB — TSH: TSH: 2.039 u[IU]/mL (ref 0.308–3.960)

## 2019-11-03 ENCOUNTER — Ambulatory Visit (HOSPITAL_COMMUNITY)
Admission: RE | Admit: 2019-11-03 | Discharge: 2019-11-03 | Disposition: A | Discharge status: Home / Self Care | Payer: Medicaid Other | Source: Ambulatory Visit | Attending: Internal Medicine | Admitting: Internal Medicine

## 2019-11-03 ENCOUNTER — Ambulatory Visit (HOSPITAL_COMMUNITY)
Admission: RE | Admit: 2019-11-03 | Discharge: 2019-11-03 | Disposition: A | Discharge status: Home / Self Care | Payer: Medicaid Other | Source: Ambulatory Visit | Attending: Physician Assistant | Admitting: Physician Assistant

## 2019-11-03 ENCOUNTER — Encounter (HOSPITAL_COMMUNITY): Payer: Self-pay

## 2019-11-03 ENCOUNTER — Other Ambulatory Visit: Payer: Self-pay

## 2019-11-03 ENCOUNTER — Other Ambulatory Visit: Payer: Self-pay | Admitting: Physician Assistant

## 2019-11-03 DIAGNOSIS — Z886 Allergy status to analgesic agent status: Secondary | ICD-10-CM | POA: Diagnosis not present

## 2019-11-03 DIAGNOSIS — F329 Major depressive disorder, single episode, unspecified: Secondary | ICD-10-CM | POA: Insufficient documentation

## 2019-11-03 DIAGNOSIS — C3490 Malignant neoplasm of unspecified part of unspecified bronchus or lung: Secondary | ICD-10-CM | POA: Diagnosis not present

## 2019-11-03 DIAGNOSIS — Z79899 Other long term (current) drug therapy: Secondary | ICD-10-CM | POA: Insufficient documentation

## 2019-11-03 DIAGNOSIS — C3491 Malignant neoplasm of unspecified part of right bronchus or lung: Secondary | ICD-10-CM

## 2019-11-03 DIAGNOSIS — Z87891 Personal history of nicotine dependence: Secondary | ICD-10-CM | POA: Diagnosis not present

## 2019-11-03 HISTORY — PX: IR IMAGING GUIDED PORT INSERTION: IMG5740

## 2019-11-03 LAB — CBC WITH DIFFERENTIAL/PLATELET
Abs Immature Granulocytes: 0.1 10*3/uL — ABNORMAL HIGH (ref 0.00–0.07)
Basophils Absolute: 0 10*3/uL (ref 0.0–0.1)
Basophils Relative: 0 %
Eosinophils Absolute: 0 10*3/uL (ref 0.0–0.5)
Eosinophils Relative: 0 %
HCT: 30 % — ABNORMAL LOW (ref 36.0–46.0)
Hemoglobin: 10.1 g/dL — ABNORMAL LOW (ref 12.0–15.0)
Immature Granulocytes: 2 %
Lymphocytes Relative: 30 %
Lymphs Abs: 1.7 10*3/uL (ref 0.7–4.0)
MCH: 33.3 pg (ref 26.0–34.0)
MCHC: 33.7 g/dL (ref 30.0–36.0)
MCV: 99 fL (ref 80.0–100.0)
Monocytes Absolute: 0.7 10*3/uL (ref 0.1–1.0)
Monocytes Relative: 12 %
Neutro Abs: 3.3 10*3/uL (ref 1.7–7.7)
Neutrophils Relative %: 56 %
Platelets: 228 10*3/uL (ref 150–400)
RBC: 3.03 MIL/uL — ABNORMAL LOW (ref 3.87–5.11)
RDW: 16.5 % — ABNORMAL HIGH (ref 11.5–15.5)
WBC: 5.7 10*3/uL (ref 4.0–10.5)
nRBC: 0 % (ref 0.0–0.2)

## 2019-11-03 LAB — PROTIME-INR
INR: 1.1 (ref 0.8–1.2)
Prothrombin Time: 13.5 seconds (ref 11.4–15.2)

## 2019-11-03 IMAGING — US IR IMAGING GUIDED PORT INSERTION
2 series · 2 of 2 positions shown · non-contrast
Comparison: None.

INDICATION: 59-year-old with adenocarcinoma of the right lung, stage IV. Request
for Port-A-Cath placement.

EXAM:
FLUOROSCOPIC AND ULTRASOUND GUIDED PLACEMENT OF A SUBCUTANEOUS PORT

[Series 1: fl - angio · 1 of 1 slices shown]
[im 1/1]
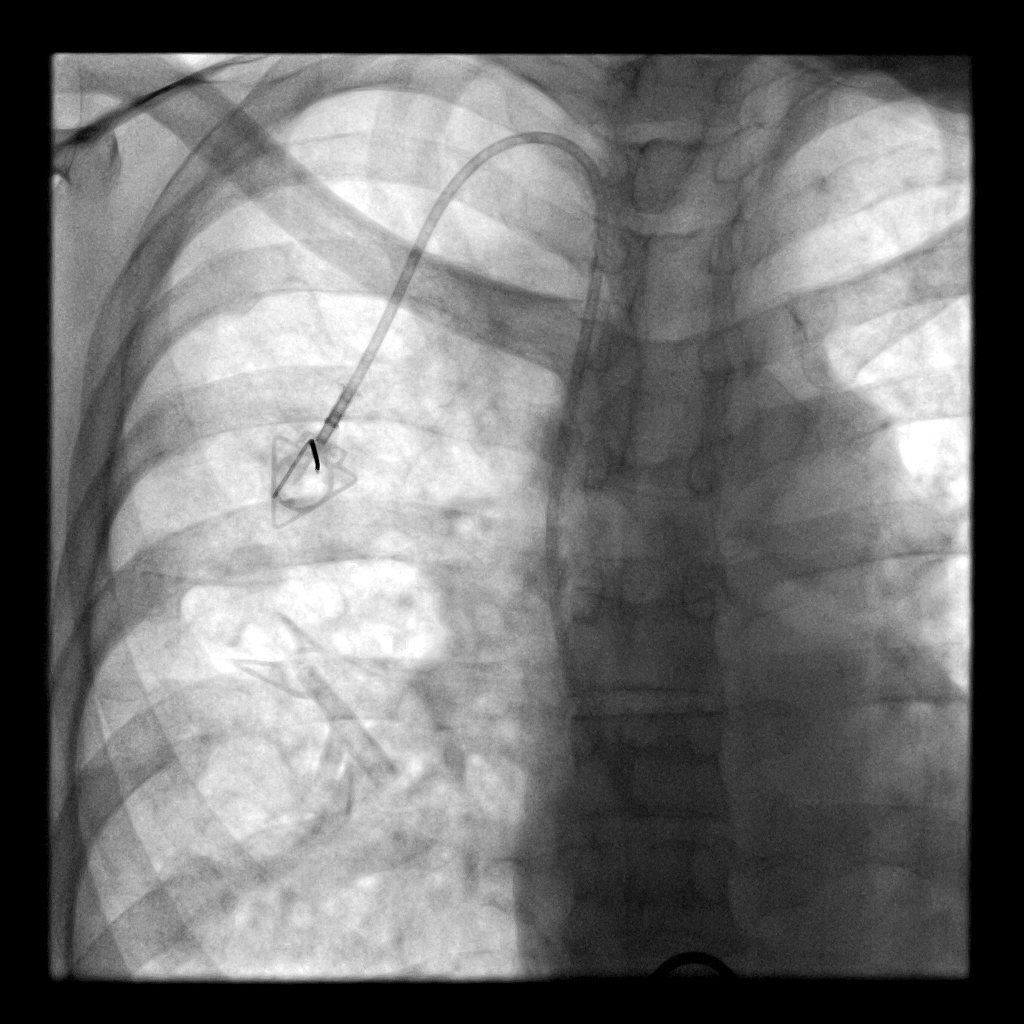

[Series 1: (id) · 1 of 1 slices shown]
[im 1/1]
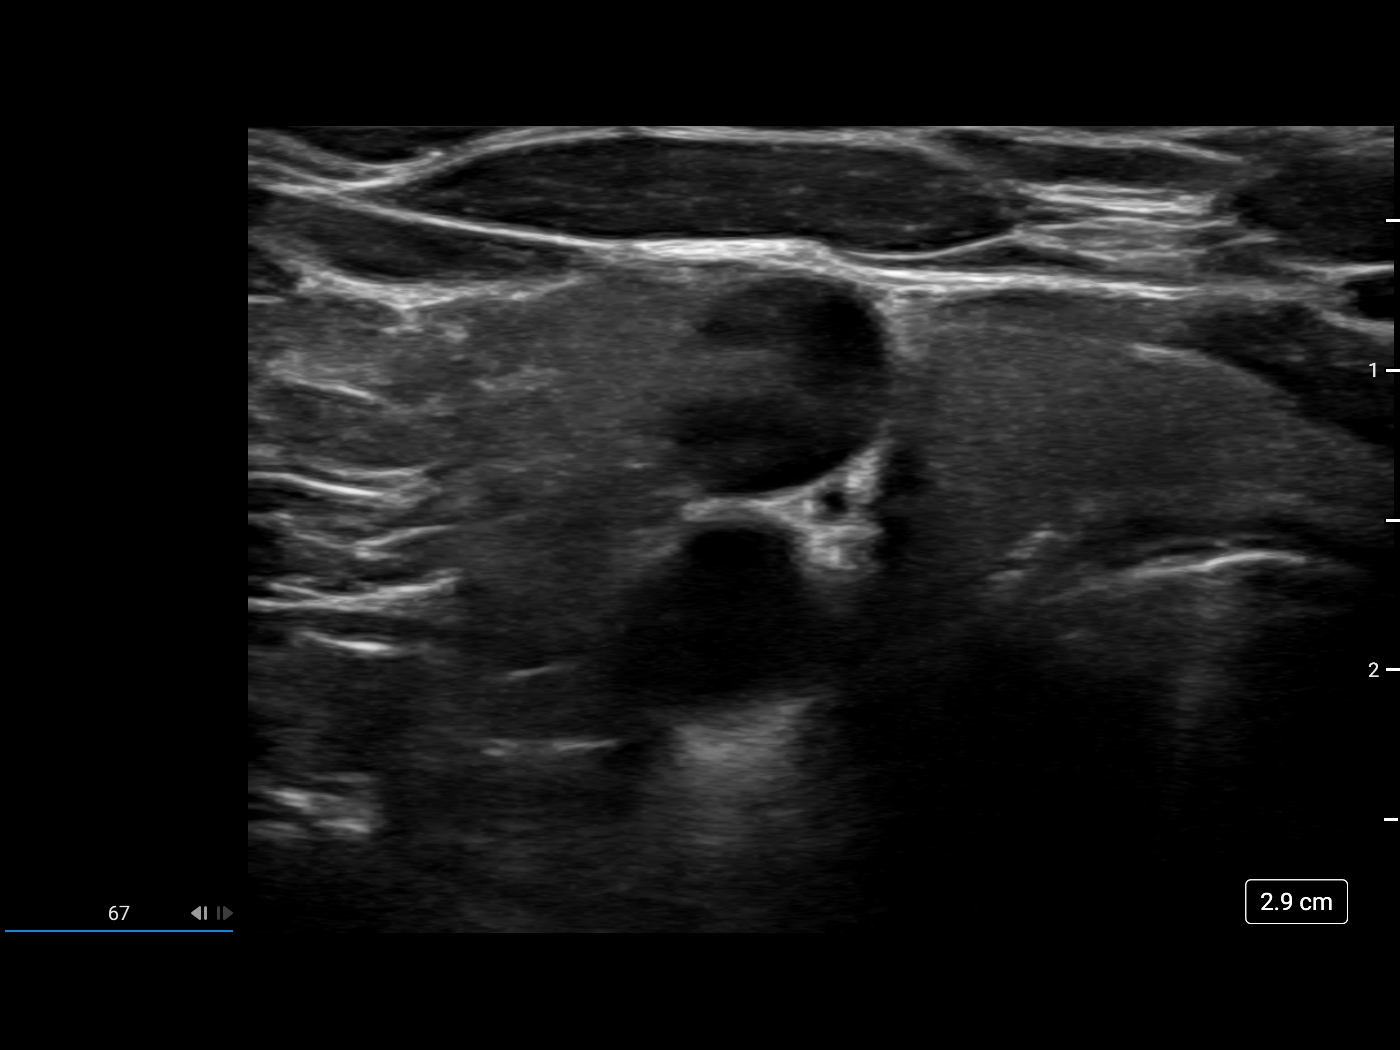

[2 of 2 positions shown; findings below may reference images not displayed]

MEDICATIONS:
Ancef 2 g; The antibiotic was administered within an appropriate
time interval prior to skin puncture.

ANESTHESIA/SEDATION:
Versed 2.0 mg IV; Fentanyl 100 mcg IV;

Moderate Sedation Time:  37 minutes

The patient was continuously monitored during the procedure by the
interventional radiology nurse under my direct supervision.

FLUOROSCOPY TIME:  12 seconds, 1 mGy

COMPLICATIONS:
None immediate.

PROCEDURE:
The procedure, risks, benefits, and alternatives were explained to
the patient. Questions regarding the procedure were encouraged and
answered. The patient understands and consents to the procedure.

Patient was placed supine on the interventional table. Ultrasound
confirmed a patent right internal jugular vein. Ultrasound image was
saved for documentation. The right chest and neck were cleaned with
a skin antiseptic and a sterile drape was placed. Maximal barrier
sterile technique was utilized including caps, mask, sterile gowns,
sterile gloves, sterile drape, hand hygiene and skin antiseptic. The
right neck was anesthetized with 1% lidocaine. Small incision was
made in the right neck with a blade. Micropuncture set was placed in
the right internal jugular vein with ultrasound guidance. The
micropuncture wire was used for measurement purposes. The right
chest was anesthetized with 1% lidocaine with epinephrine. #15 blade
was used to make an incision and a subcutaneous port pocket was
formed. 8 french Power Port was assembled. Subcutaneous tunnel was
formed with a stiff tunneling device. The port catheter was brought
through the subcutaneous tunnel. The port was placed in the
subcutaneous pocket and sutured in place. The micropuncture set was
exchanged for a peel-away sheath. The catheter was placed through
the peel-away sheath and the tip was positioned at the superior
cavoatrial junction. Catheter placement was confirmed with
fluoroscopy. The port was accessed and flushed with heparinized
saline. The port pocket was closed using two layers of absorbable
sutures and Dermabond. The vein skin site was closed using a single
layer of absorbable suture and Dermabond. Sterile dressings were
applied. Patient tolerated the procedure well without an immediate
complication. Ultrasound and fluoroscopic images were taken and
saved for this procedure.
IMPRESSION: Placement of a subcutaneous port device. Catheter tip at the
superior cavoatrial junction.

## 2019-11-03 MED ORDER — FENTANYL CITRATE (PF) 100 MCG/2ML IJ SOLN
INTRAMUSCULAR | Status: AC
  Filled 2019-11-03: qty 2

## 2019-11-03 MED ORDER — SODIUM CHLORIDE 0.9 % IV SOLN: INTRAVENOUS | Status: DC

## 2019-11-03 MED ORDER — HEPARIN SOD (PORK) LOCK FLUSH 100 UNIT/ML IV SOLN
INTRAVENOUS | Status: AC | PRN
  Administered 2019-11-03: 500 [IU] via INTRAVENOUS

## 2019-11-03 MED ORDER — LIDOCAINE HCL 1 % IJ SOLN
INTRAMUSCULAR | Status: AC
  Filled 2019-11-03: qty 20

## 2019-11-03 MED ORDER — FENTANYL CITRATE (PF) 100 MCG/2ML IJ SOLN
INTRAMUSCULAR | Status: AC | PRN
  Administered 2019-11-03 (×2): 50 ug via INTRAVENOUS

## 2019-11-03 MED ORDER — MIDAZOLAM HCL 2 MG/2ML IJ SOLN
INTRAMUSCULAR | Status: AC | PRN
  Administered 2019-11-03 (×2): 1 mg via INTRAVENOUS

## 2019-11-03 MED ORDER — HEPARIN SOD (PORK) LOCK FLUSH 100 UNIT/ML IV SOLN
INTRAVENOUS | Status: AC
  Filled 2019-11-03: qty 5

## 2019-11-03 MED ORDER — MIDAZOLAM HCL 2 MG/2ML IJ SOLN
INTRAMUSCULAR | Status: AC
  Filled 2019-11-03: qty 4

## 2019-11-03 MED ORDER — LIDOCAINE HCL (PF) 1 % IJ SOLN
INTRAMUSCULAR | Status: AC | PRN
  Administered 2019-11-03: 5 mL

## 2019-11-03 MED ORDER — CEFAZOLIN SODIUM-DEXTROSE 2-4 GM/100ML-% IV SOLN
INTRAVENOUS | Status: AC
  Administered 2019-11-03: 2 g via INTRAVENOUS
  Filled 2019-11-03: qty 100

## 2019-11-03 MED ORDER — LIDOCAINE-EPINEPHRINE 1 %-1:100000 IJ SOLN
INTRAMUSCULAR | Status: AC
  Filled 2019-11-03: qty 1

## 2019-11-03 MED ORDER — CEFAZOLIN SODIUM-DEXTROSE 2-4 GM/100ML-% IV SOLN: 2.0000 g | Freq: Once | INTRAVENOUS | Status: AC

## 2019-11-03 NOTE — Procedures (Signed)
Interventional Radiology Procedure: ° ° °Indications: Metastatic lung cancer ° °Procedure: Port placement ° °Findings: Right jugular port, tip at SVC/RA junction ° °Complications: None °    °EBL: Minimal, less than 10 ml ° °Plan: Discharge in one hour.  Keep port site and incisions dry for at least 24 hours.   ° ° °Fain Francis R. Jorel Gravlin, MD  °Pager: [phone] ° °  °

## 2019-11-03 NOTE — H&P (Signed)
Referring Physician(s): Heilingoetter,Cassandra L/[name],M  Supervising Physician: Markus Daft  Patient Status:  [facility]  Chief Complaint: "I'm getting a port a cath"   Subjective Patient familiar to IR service from right lung nodule biopsy on 08/10/2019.  She is a 59 year old female, ex-smoker, with a history of stage IV lung adenocarcinoma who presented with a large right upper lobe lung mass in addition to right paratracheal lymphadenopathy and bilateral pulmonary nodules/retroperitoneal lymphadenopathy in May of this year.  She is scheduled today for Port-A-Cath placement to assist with chemotherapy administration.  She currently denies fever, headache, chest pain, cough, abdominal pain, nausea, vomiting or bleeding.  She does have back pain as well as some dyspnea with exertion.  Past Medical History:  Diagnosis Date  . Breast cancer ([place])   . Depression   . Mental disorder    Past Surgical History:  Procedure Laterality Date  . TUBAL LIGATION    . VIDEO BRONCHOSCOPY WITH ENDOBRONCHIAL NAVIGATION N/A 04/04/2019   Procedure: VIDEO BRONCHOSCOPY WITH ENDOBRONCHIAL NAVIGATION;  Surgeon: Garner Nash, DO;  Location: [facility];  Service: Thoracic;  Laterality: N/A;  . VIDEO BRONCHOSCOPY WITH ENDOBRONCHIAL ULTRASOUND N/A 04/04/2019   Procedure: VIDEO BRONCHOSCOPY WITH ENDOBRONCHIAL ULTRASOUND;  Surgeon: Garner Nash, DO;  Location: [facility];  Service: Thoracic;  Laterality: N/A;     Allergies: Aspirin adult low [aspirin]  Medications: Prior to Admission medications   Medication Sig Start Date End Date Taking? Authorizing Provider  potassium chloride SA (KLOR-CON) 20 MEQ tablet Take 1 tablet (20 mEq total) by mouth daily. 10/26/19  Yes Heilingoetter, Cassandra L, PA-C  calcium carbonate (OSCAL) 1500 (600 Ca) MG TABS tablet Take 600 mg by mouth daily.    [provider]  folic acid (FOLVITE) 1 MG tablet Take 1 tablet (1 mg total) by mouth daily. 09/22/19   Curt Bears,  MD  HYDROcodone-acetaminophen (NORCO/VICODIN) 5-325 MG tablet Take 1 tablet by mouth every 4 (four) hours as needed. Patient not taking: Reported on 09/14/2019 09/11/19   Isla Pence, MD  lidocaine-prilocaine (EMLA) cream Apply 1 application topically as needed. 10/19/19   Heilingoetter, Cassandra L, PA-C  oxyCODONE-acetaminophen (PERCOCET/ROXICET) 5-325 MG tablet Take 1 tablet by mouth every 6 (six) hours as needed for severe pain. 04/19/19   Garner Nash, DO  prochlorperazine (COMPAZINE) 10 MG tablet Take 1 tablet (10 mg total) by mouth every 6 (six) hours as needed for nausea or vomiting. 09/19/19   Curt Bears, MD     Vital Signs: BP 112/70 (BP Location: Right Arm)   Pulse 100   Temp 98.1 F (36.7 C) (Oral)   Resp 18   LMP 01/19/2011   SpO2 98%   Physical Exam patient awake, answering questions appropriately.  Chest clear to auscultation bilaterally.  Heart with regular rate and rhythm.  Abdomen soft, positive bowel sounds, nontender.  No significant lower extremity edema.  Imaging: No results found.  Labs:  CBC: Recent Labs    10/19/19 0737 10/26/19 1146 11/02/19 1221 11/03/19 1308  WBC 3.0* 8.3 7.3 5.7  HGB 12.0 9.9* 9.9* 10.1*  HCT 34.6* 28.5* 30.0* 30.0*  PLT 113* 71* 162 228    COAGS: Recent Labs    04/04/19 0228 08/10/19 0911 11/03/19 1308  INR 1.1 1.1 1.1  APTT 32  --   --     BMP: Recent Labs    10/12/19 0829 10/19/19 0737 10/26/19 1146 11/02/19 1221  NA 142 132* 136 137  K 3.3* 2.7* 3.1* 4.2  CL 103 96* 97* 107  CO2 21* 23 27 22   GLUCOSE 101* 88 96 84  BUN <4* <4* 4* <4*  CALCIUM 9.3 9.1 9.4 9.4  CREATININE 0.70 0.65 0.65 0.71  GFRNONAA >60 >60 >60 >60  GFRAA >60 >60 >60 >60    LIVER FUNCTION TESTS: Recent Labs    10/12/19 0829 10/19/19 0737 10/26/19 1146 11/02/19 1221  BILITOT <0.2* 0.6 0.5 <0.2*  AST 33 74* 26 21  ALT 31 44 20 19  ALKPHOS 150* 155* 169* 181*  PROT 7.7 6.9 7.2 7.3  ALBUMIN 3.4* 3.2* 3.0* 3.0*     Assessment and Plan: 59 year old female, ex-smoker, with a history of stage IV lung adenocarcinoma who presented with a large right upper lobe lung mass in addition to right paratracheal lymphadenopathy and bilateral pulmonary nodules/retroperitoneal lymphadenopathy in May of this year.  She is scheduled today for Port-A-Cath placement to assist with chemotherapy administration.Risks and benefits of image guided port-a-catheter placement was discussed with the patient including, but not limited to bleeding, infection, pneumothorax, or fibrin sheath development and need for additional procedures.  All of the patient's questions were answered, patient is agreeable to proceed. Consent signed and in chart.     Electronically Signed: D. Rowe Robert, PA-C 11/03/2019, 1:33 PM   I spent a total of 25 minutes at the the patient's bedside AND on the patient's hospital floor or unit, greater than 50% of which was counseling/coordinating care for Port-A-Cath placement

## 2019-11-03 NOTE — Discharge Instructions (Signed)
Please call Interventional Radiology clinic [phone] with any questions or concerns.  You may remove your dressing and shower in 24 to 48 hours.  DO NOT use EMLA cream for 2 weeks after port placement as this cream will remove surgical glue on your incision.  Your doctor should have appointments set up to have your port flushed every 4 to 6 weeks should you not be using your port otherwise.   Implanted Port Insertion, Care After This sheet gives you information about how to care for yourself after your procedure. Your health care provider may also give you more specific instructions. If you have problems or questions, contact your health care provider. What can I expect after the procedure? After the procedure, it is common to have:  Discomfort at the port insertion site.  Bruising on the skin over the port. This should improve over 3-4 days. Follow these instructions at home: [facility] care  After your port is placed, you will get a manufacturer's information card. The card has information about your port. Keep this card with you at all times.  Take care of the port as told by your health care provider. Ask your health care provider if you or a family member can get training for taking care of the port at home. A home health care nurse may also take care of the port.  Make sure to remember what type of port you have. Incision care  Follow instructions from your health care provider about how to take care of your port insertion site. Make sure you: ? Wash your hands with soap and water before and after you change your bandage (dressing). If soap and water are not available, use hand sanitizer. ? Change your dressing as told by your health care provider. ? Leave stitches (sutures), skin glue, or adhesive strips in place. These skin closures may need to stay in place for 2 weeks or longer. If adhesive strip edges start to loosen and curl up, you may trim the loose edges. Do not remove  adhesive strips completely unless your health care provider tells you to do that.  Check your port insertion site every day for signs of infection. Check for: ? Redness, swelling, or pain. ? Fluid or blood. ? Warmth. ? Pus or a bad smell. Activity  Return to your normal activities as told by your health care provider. Ask your health care provider what activities are safe for you.  Do not lift anything that is heavier than 10 lb (4.5 kg), or the limit that you are told, until your health care provider says that it is safe. General instructions  Take over-the-counter and prescription medicines only as told by your health care provider.  Do not take baths, swim, or use a hot tub until your health care provider approves. Ask your health care provider if you may take showers. You may only be allowed to take sponge baths.  Do not drive for 24 hours if you were given a sedative during your procedure.  Wear a medical alert bracelet in case of an emergency. This will tell any health care providers that you have a port.  Keep all follow-up visits as told by your health care provider. This is important. Contact a health care provider if:  You cannot flush your port with saline as directed, or you cannot draw blood from the port.  You have a fever or chills.  You have redness, swelling, or pain around your port insertion site.  You have  fluid or blood coming from your port insertion site.  Your port insertion site feels warm to the touch.  You have pus or a bad smell coming from the port insertion site. Get help right away if:  You have chest pain or shortness of breath.  You have bleeding from your port that you cannot control. Summary  Take care of the port as told by your health care provider. Keep the manufacturer's information card with you at all times.  Change your dressing as told by your health care provider.  Contact a health care provider if you have a fever or chills  or if you have redness, swelling, or pain around your port insertion site.  Keep all follow-up visits as told by your health care provider. This information is not intended to replace advice given to you by your health care provider. Make sure you discuss any questions you have with your health care provider. Document Revised: 10/05/2017 Document Reviewed: 10/05/2017 Elsevier Patient Education  [place].   Moderate Conscious Sedation, Adult, Care After These instructions provide you with information about caring for yourself after your procedure. Your health care provider may also give you more specific instructions. Your treatment has been planned according to current medical practices, but problems sometimes occur. Call your health care provider if you have any problems or questions after your procedure. What can I expect after the procedure? After your procedure, it is common:  To feel sleepy for several hours.  To feel clumsy and have poor balance for several hours.  To have poor judgment for several hours.  To vomit if you eat too soon. Follow these instructions at home: For at least 24 hours after the procedure:   Do not: ? Participate in activities where you could fall or become injured. ? Drive. ? Use heavy machinery. ? Drink alcohol. ? Take sleeping pills or medicines that cause drowsiness. ? Make important decisions or sign legal documents. ? Take care of children on your own.  Rest. Eating and drinking  Follow the diet recommended by your health care provider.  If you vomit: ? Drink water, juice, or soup when you can drink without vomiting. ? Make sure you have little or no nausea before eating solid foods. General instructions  Have a responsible adult stay with you until you are awake and alert.  Take over-the-counter and prescription medicines only as told by your health care provider.  If you smoke, do not smoke without supervision.  Keep all  follow-up visits as told by your health care provider. This is important. Contact a health care provider if:  You keep feeling nauseous or you keep vomiting.  You feel light-headed.  You develop a rash.  You have a fever. Get help right away if:  You have trouble breathing. This information is not intended to replace advice given to you by your health care provider. Make sure you discuss any questions you have with your health care provider. Document Revised: 02/19/2017 Document Reviewed: 06/29/2015 Elsevier Patient Education  2020 [employer].

## 2019-11-09 ENCOUNTER — Inpatient Hospital Stay: Payer: Medicaid Other

## 2019-11-09 ENCOUNTER — Other Ambulatory Visit: Payer: Self-pay

## 2019-11-09 ENCOUNTER — Inpatient Hospital Stay: Payer: Medicaid Other | Admitting: Internal Medicine

## 2019-11-10 ENCOUNTER — Telehealth: Payer: Self-pay | Admitting: Physician Assistant

## 2019-11-10 NOTE — Telephone Encounter (Signed)
R/s appt per 8/19 sch msg - spoke with niece and she is aware of new appt date and time. Also aware of f/u and chemo time gap and is okay

## 2019-11-11 ENCOUNTER — Ambulatory Visit: Payer: Self-pay

## 2019-11-13 ENCOUNTER — Telehealth: Payer: Self-pay | Admitting: Medical Oncology

## 2019-11-13 NOTE — Progress Notes (Signed)
[place] OFFICE PROGRESS NOTE  System, Pcp Not In No address on file  DIAGNOSIS: Stage IV (T2 a, N2, M1 B) non-small cell lung cancer, adenocarcinoma presented with large right upper lobe lung mass in addition to suspicious right paratracheal lymphadenopathy and bilateral pulmonary nodules as well as retroperitoneal lymphadenopathy diagnosed in May 2021. The patient has no actionable mutations and has negative PD-L1 expression on molecular studies by foundation 1.  PRIOR THERAPY: None  CURRENT THERAPY: Systemic chemotherapy with carboplatin for an AUC of 5, Alimta 500 mg/m2, and Keytruda 200 mg IV every 3 weeks. First dose 09/28/2019. Status post 2 cycles.  INTERVAL HISTORY: Lorinda Copland 59 y.o. female returns to the clinic today for a follow up visit.The patient is feeling well today without any concerning complaints.She continues to tolerate her treatment well without any adverse effects.Denies any fever, chills, night sweats, or weight loss. Denies any chest pain or hemoptysis.She reports cough with associated phlegm. She also has some baseline dyspnea on exertion. She states she will feel short of breath if she goes up more than 10-11 steps.Denies any nausea, vomiting, diarrhea, or constipation. Denies any headache or visual changes besides her baseline visual changes.She denies any signs or symptoms of infection including skin infections, nasal congestion, sore throat, or dysuria.The patient is here today for evaluationbefore staring cycle #3.   MEDICAL HISTORY: Past Medical History:  Diagnosis Date  . Breast cancer ([place])   . Depression   . Mental disorder     ALLERGIES:  is allergic to aspirin adult low [aspirin].  MEDICATIONS:  Current Outpatient Medications  Medication Sig Dispense Refill  . calcium carbonate (OSCAL) 1500 (600 Ca) MG TABS tablet Take 600 mg by mouth daily.    . folic acid (FOLVITE) 1 MG tablet Take 1 tablet (1 mg total) by mouth daily.  30 tablet 4  . HYDROcodone-acetaminophen (NORCO/VICODIN) 5-325 MG tablet Take 1 tablet by mouth every 4 (four) hours as needed. (Patient not taking: Reported on 09/14/2019) 10 tablet 0  . lidocaine-prilocaine (EMLA) cream Apply 1 application topically as needed. 30 g 2  . oxyCODONE-acetaminophen (PERCOCET/ROXICET) 5-325 MG tablet Take 1 tablet by mouth every 6 (six) hours as needed for severe pain. 30 tablet 0  . potassium chloride SA (KLOR-CON) 20 MEQ tablet Take 1 tablet (20 mEq total) by mouth 2 (two) times daily. 14 tablet 0  . prochlorperazine (COMPAZINE) 10 MG tablet Take 1 tablet (10 mg total) by mouth every 6 (six) hours as needed for nausea or vomiting. 30 tablet 3   No current facility-administered medications for this visit.    SURGICAL HISTORY:  Past Surgical History:  Procedure Laterality Date  . IR IMAGING GUIDED PORT INSERTION  11/03/2019  . TUBAL LIGATION    . VIDEO BRONCHOSCOPY WITH ENDOBRONCHIAL NAVIGATION N/A 04/04/2019   Procedure: VIDEO BRONCHOSCOPY WITH ENDOBRONCHIAL NAVIGATION;  Surgeon: Garner Nash, DO;  Location: [facility];  Service: Thoracic;  Laterality: N/A;  . VIDEO BRONCHOSCOPY WITH ENDOBRONCHIAL ULTRASOUND N/A 04/04/2019   Procedure: VIDEO BRONCHOSCOPY WITH ENDOBRONCHIAL ULTRASOUND;  Surgeon: Garner Nash, DO;  Location: [facility];  Service: Thoracic;  Laterality: N/A;    REVIEW OF SYSTEMS:   Review of Systems  Constitutional: Negative for appetite change, chills, fatigue, fever and unexpected weight change.  HENT: Negative for mouth sores, nosebleeds, sore throat and trouble swallowing.  Eyes: Negative for eye problems and icterus.  Respiratory:Positive for baseline dyspnea on exertion and mild cough.Negative for hemoptysis and wheezing.  Cardiovascular: Negative  for chest pain and leg swelling.  Gastrointestinal: Negative for abdominal pain, constipation, diarrhea, nausea and vomiting.  Genitourinary: Negative for bladder incontinence, difficulty  urinating, dysuria, frequency and hematuria.  Musculoskeletal: Negative for back pain, gait problem, neck pain and neck stiffness.  Skin: Positive for firm vein along right forearm. Neurological: Negative for dizziness, extremity weakness, gait problem, headaches, light-headedness and seizures.  Hematological: Negative for adenopathy. Does not bruise/bleed easily.  Psychiatric/Behavioral: Negative for confusion, depression and sleep disturbance. The patient is not nervous/anxious.   PHYSICAL EXAMINATION:  Last menstrual period 01/19/2011.  ECOG PERFORMANCE STATUS: 1 - Symptomatic but completely ambulatory  Physical Exam  Constitutional: Oriented to person, place, and time and well-developed, well-nourished, and in no distress. No distress.  HENT:  Head: Normocephalic and atraumatic.  Mouth/Throat: Oropharynx is clear and moist. No oropharyngeal exudate.  Eyes: Conjunctivae are normal. Right eye exhibits no discharge. Left eye exhibits no discharge. No scleral icterus.  Neck: Normal range of motion. Neck supple.  Cardiovascular: Normal rate, regular rhythm, normal heart sounds and intact distal pulses.   Pulmonary/Chest: Effort normal and breath sounds normal. No respiratory distress. No wheezes. No rales.  Abdominal: Soft. Bowel sounds are normal. Exhibits no distension and no mass. There is no tenderness.  Musculoskeletal: Normal range of motion. Exhibits no edema.  Lymphadenopathy:    No cervical adenopathy.  Neurological: Alert and oriented to person, place, and time. Exhibits normal muscle tone. Gait normal. Coordination normal.  Skin: Skin is warm and dry. No rash noted. Not diaphoretic. No erythema. No pallor.  Psychiatric: Mood, memory and judgment normal.  Vitals reviewed.  LABORATORY DATA: Lab Results  Component Value Date   WBC 2.8 (L) 11/15/2019   HGB 10.2 (L) 11/15/2019   HCT 29.4 (L) 11/15/2019   MCV 97.0 11/15/2019   PLT 105 (L) 11/15/2019      Chemistry       Component Value Date/Time   NA 131 (L) 11/15/2019 1041   K 2.7 (LL) 11/15/2019 1041   CL 97 (L) 11/15/2019 1041   CO2 26 11/15/2019 1041   BUN 5 (L) 11/15/2019 1041   CREATININE 0.65 11/15/2019 1041      Component Value Date/Time   CALCIUM 8.7 (L) 11/15/2019 1041   ALKPHOS 155 (H) 11/15/2019 1041   AST 83 (H) 11/15/2019 1041   ALT 42 11/15/2019 1041   BILITOT 0.3 11/15/2019 1041       RADIOGRAPHIC STUDIES:  IR IMAGING GUIDED PORT INSERTION  Result Date: 11/03/2019 INDICATION: 59 year old with adenocarcinoma of the right lung, stage IV. Request for Port-A-Cath placement. EXAM: FLUOROSCOPIC AND ULTRASOUND GUIDED PLACEMENT OF A SUBCUTANEOUS PORT COMPARISON:  None. MEDICATIONS: Ancef 2 g; The antibiotic was administered within an appropriate time interval prior to skin puncture. ANESTHESIA/SEDATION: Versed 2.0 mg IV; Fentanyl 100 mcg IV; Moderate Sedation Time:  37 minutes The patient was continuously monitored during the procedure by the interventional radiology nurse under my direct supervision. FLUOROSCOPY TIME:  12 seconds, 1 mGy COMPLICATIONS: None immediate. PROCEDURE: The procedure, risks, benefits, and alternatives were explained to the patient. Questions regarding the procedure were encouraged and answered. The patient understands and consents to the procedure. Patient was placed supine on the interventional table. Ultrasound confirmed a patent right internal jugular vein. Ultrasound image was saved for documentation. The right chest and neck were cleaned with a skin antiseptic and a sterile drape was placed. Maximal barrier sterile technique was utilized including caps, mask, sterile gowns, sterile gloves, sterile drape, hand hygiene and skin  antiseptic. The right neck was anesthetized with 1% lidocaine. Small incision was made in the right neck with a blade. Micropuncture set was placed in the right internal jugular vein with ultrasound guidance. The micropuncture wire was used for  measurement purposes. The right chest was anesthetized with 1% lidocaine with epinephrine. #15 blade was used to make an incision and a subcutaneous port pocket was formed. Kramer was assembled. Subcutaneous tunnel was formed with a stiff tunneling device. The port catheter was brought through the subcutaneous tunnel. The port was placed in the subcutaneous pocket and sutured in place. The micropuncture set was exchanged for a peel-away sheath. The catheter was placed through the peel-away sheath and the tip was positioned at the superior cavoatrial junction. Catheter placement was confirmed with fluoroscopy. The port was accessed and flushed with heparinized saline. The port pocket was closed using two layers of absorbable sutures and Dermabond. The vein skin site was closed using a single layer of absorbable suture and Dermabond. Sterile dressings were applied. Patient tolerated the procedure well without an immediate complication. Ultrasound and fluoroscopic images were taken and saved for this procedure. IMPRESSION: Placement of a subcutaneous port device. Catheter tip at the superior cavoatrial junction. Electronically Signed   By: Markus Daft M.D.   On: 11/03/2019 17:19     ASSESSMENT/PLAN:  This is a very pleasant 59 year old African-American female recently diagnosed with a stage IV (T2 a, N2, M1 B) non-small cell lung cancer, adenocarcinoma presented with large right upper lobe lung mass in addition to suspicious right paratracheal lymphadenopathy and bilateral pulmonary nodules as well as retroperitoneal lymphadenopathy diagnosed in May 2021. The patient has no actionable mutations and has negative PD-L1 expression on molecular studies by foundation 1.  The patient is currently undergoing palliative systemic chemotherapy with carboplatin for AUC of 5, Alimta 500 mg/M2 and Keytruda 200 mg IV every 3 weeks. She is status post 2 cycles. She will have udenyca starting from cycle  #2.  Labs were reviewed. Recommend that she proceed with cycle #3 today as scheduled.   I will arrange for a restaging CT scan prior to cycle #4  We will see her back for a follow up visit in 3 weeks for evaluation and to review her scan results before starting cycle #4.   Her potassium is low today. She will receive 30 meq in IVF today. She also will take 20 meq BID for 1 week.   The patient was advised to call immediately if she has any concerning symptoms in the interval. The patient voices understanding of current disease status and treatment options and is in agreement with the current care plan. All questions were answered. The patient knows to call the clinic with any problems, questions or concerns. We can certainly see the patient much sooner if necessary   Orders Placed This Encounter  Procedures  . CT Chest W Contrast    Standing Status:   Future    Standing Expiration Date:   11/14/2020    Order Specific Question:   If indicated for the ordered procedure, I authorize the administration of contrast media per Radiology protocol    Answer:   Yes    Order Specific Question:   Is patient pregnant?    Answer:   No    Order Specific Question:   Preferred imaging location?    Answer:   [facility]    Order Specific Question:   Radiology Contrast Protocol - do  NOT remove file path    Answer:   [email or web address]  . CT Abdomen Pelvis W Contrast    Standing Status:   Future    Standing Expiration Date:   11/14/2020    Order Specific Question:   If indicated for the ordered procedure, I authorize the administration of contrast media per Radiology protocol    Answer:   Yes    Order Specific Question:   Is patient pregnant?    Answer:   No    Order Specific Question:   Preferred imaging location?    Answer:   [facility]    Order Specific Question:   Is Oral Contrast requested for this exam?    Answer:   Yes, Per Radiology protocol     Order Specific Question:   Radiology Contrast Protocol - do NOT remove file path    Answer:   [email or web address]     [place], PA-C 11/15/19

## 2019-11-13 NOTE — Telephone Encounter (Signed)
Pt confirmed appts.

## 2019-11-15 ENCOUNTER — Inpatient Hospital Stay (HOSPITAL_BASED_OUTPATIENT_CLINIC_OR_DEPARTMENT_OTHER): Payer: Medicaid Other | Admitting: Physician Assistant

## 2019-11-15 ENCOUNTER — Other Ambulatory Visit: Payer: Self-pay

## 2019-11-15 ENCOUNTER — Inpatient Hospital Stay: Payer: Medicaid Other

## 2019-11-15 ENCOUNTER — Other Ambulatory Visit: Payer: Self-pay | Admitting: Physician Assistant

## 2019-11-15 ENCOUNTER — Other Ambulatory Visit: Payer: Self-pay | Admitting: Internal Medicine

## 2019-11-15 VITALS — BP 99/58 | HR 90 | Temp 98.3°F | Resp 18

## 2019-11-15 DIAGNOSIS — C3491 Malignant neoplasm of unspecified part of right bronchus or lung: Secondary | ICD-10-CM

## 2019-11-15 DIAGNOSIS — E876 Hypokalemia: Secondary | ICD-10-CM

## 2019-11-15 DIAGNOSIS — Z5111 Encounter for antineoplastic chemotherapy: Secondary | ICD-10-CM

## 2019-11-15 DIAGNOSIS — Z5112 Encounter for antineoplastic immunotherapy: Secondary | ICD-10-CM | POA: Diagnosis not present

## 2019-11-15 DIAGNOSIS — Z95828 Presence of other vascular implants and grafts: Secondary | ICD-10-CM

## 2019-11-15 LAB — CBC WITH DIFFERENTIAL (CANCER CENTER ONLY)
Abs Immature Granulocytes: 0.02 10*3/uL (ref 0.00–0.07)
Basophils Absolute: 0 10*3/uL (ref 0.0–0.1)
Basophils Relative: 0 %
Eosinophils Absolute: 0 10*3/uL (ref 0.0–0.5)
Eosinophils Relative: 0 %
HCT: 29.4 % — ABNORMAL LOW (ref 36.0–46.0)
Hemoglobin: 10.2 g/dL — ABNORMAL LOW (ref 12.0–15.0)
Immature Granulocytes: 1 %
Lymphocytes Relative: 26 %
Lymphs Abs: 0.7 10*3/uL (ref 0.7–4.0)
MCH: 33.7 pg (ref 26.0–34.0)
MCHC: 34.7 g/dL (ref 30.0–36.0)
MCV: 97 fL (ref 80.0–100.0)
Monocytes Absolute: 0.6 10*3/uL (ref 0.1–1.0)
Monocytes Relative: 20 %
Neutro Abs: 1.5 10*3/uL — ABNORMAL LOW (ref 1.7–7.7)
Neutrophils Relative %: 53 %
Platelet Count: 105 10*3/uL — ABNORMAL LOW (ref 150–400)
RBC: 3.03 MIL/uL — ABNORMAL LOW (ref 3.87–5.11)
RDW: 17.7 % — ABNORMAL HIGH (ref 11.5–15.5)
WBC Count: 2.8 10*3/uL — ABNORMAL LOW (ref 4.0–10.5)
nRBC: 0 % (ref 0.0–0.2)

## 2019-11-15 LAB — CMP (CANCER CENTER ONLY)
ALT: 42 U/L (ref 0–44)
AST: 83 U/L — ABNORMAL HIGH (ref 15–41)
Albumin: 2.9 g/dL — ABNORMAL LOW (ref 3.5–5.0)
Alkaline Phosphatase: 155 U/L — ABNORMAL HIGH (ref 38–126)
Anion gap: 8 (ref 5–15)
BUN: 5 mg/dL — ABNORMAL LOW (ref 6–20)
CO2: 26 mmol/L (ref 22–32)
Calcium: 8.7 mg/dL — ABNORMAL LOW (ref 8.9–10.3)
Chloride: 97 mmol/L — ABNORMAL LOW (ref 98–111)
Creatinine: 0.65 mg/dL (ref 0.44–1.00)
GFR, Est AFR Am: >60 mL/min (ref >60)
GFR, Estimated: >60 mL/min (ref >60)
Glucose, Bld: 95 mg/dL (ref 70–99)
Potassium: 2.7 mmol/L — CL (ref 3.5–5.1)
Sodium: 131 mmol/L — ABNORMAL LOW (ref 135–145)
Total Bilirubin: 0.3 mg/dL (ref 0.3–1.2)
Total Protein: 6.8 g/dL (ref 6.5–8.1)

## 2019-11-15 MED ORDER — SODIUM CHLORIDE 0.9 % IV SOLN
500.0000 mg/m2 | Freq: Once | INTRAVENOUS | Status: AC
  Administered 2019-11-15: 800 mg via INTRAVENOUS
  Filled 2019-11-15: qty 12

## 2019-11-15 MED ORDER — SODIUM CHLORIDE 0.9 % IV SOLN
10.0000 mg | Freq: Once | INTRAVENOUS | Status: AC
  Administered 2019-11-15: 10 mg via INTRAVENOUS
  Filled 2019-11-15: qty 10

## 2019-11-15 MED ORDER — SODIUM CHLORIDE 0.9% FLUSH
10.0000 mL | INTRAVENOUS | Status: DC | PRN
  Administered 2019-11-15: 10 mL
  Filled 2019-11-15: qty 10

## 2019-11-15 MED ORDER — PROCHLORPERAZINE MALEATE 10 MG PO TABS: 10.0000 mg | ORAL_TABLET | Freq: Four times a day (QID) | ORAL | 3 refills | Status: DC | PRN

## 2019-11-15 MED ORDER — SODIUM CHLORIDE 0.9 % IV SOLN
150.0000 mg | Freq: Once | INTRAVENOUS | Status: AC
  Administered 2019-11-15: 150 mg via INTRAVENOUS
  Filled 2019-11-15: qty 150

## 2019-11-15 MED ORDER — HEPARIN SOD (PORK) LOCK FLUSH 100 UNIT/ML IV SOLN
500.0000 [IU] | Freq: Once | INTRAVENOUS | Status: AC | PRN
  Administered 2019-11-15: 500 [IU]
  Filled 2019-11-15: qty 5

## 2019-11-15 MED ORDER — PALONOSETRON HCL INJECTION 0.25 MG/5ML
0.2500 mg | Freq: Once | INTRAVENOUS | Status: AC
  Administered 2019-11-15: 0.25 mg via INTRAVENOUS

## 2019-11-15 MED ORDER — PALONOSETRON HCL INJECTION 0.25 MG/5ML
INTRAVENOUS | Status: AC
  Filled 2019-11-15: qty 5

## 2019-11-15 MED ORDER — POTASSIUM CHLORIDE CRYS ER 20 MEQ PO TBCR: 20.0000 meq | EXTENDED_RELEASE_TABLET | Freq: Two times a day (BID) | ORAL | 0 refills | Status: DC

## 2019-11-15 MED ORDER — SODIUM CHLORIDE 0.9% FLUSH
10.0000 mL | INTRAVENOUS | Status: DC | PRN
  Administered 2019-11-15: 10 mL via INTRAVENOUS
  Filled 2019-11-15: qty 10

## 2019-11-15 MED ORDER — SODIUM CHLORIDE 0.9 % IV SOLN
Freq: Once | INTRAVENOUS | Status: AC
  Filled 2019-11-15: qty 250

## 2019-11-15 MED ORDER — CYANOCOBALAMIN 1000 MCG/ML IJ SOLN
INTRAMUSCULAR | Status: AC
  Filled 2019-11-15: qty 1

## 2019-11-15 MED ORDER — SODIUM CHLORIDE 0.9 % IV SOLN
Freq: Once | INTRAVENOUS | Status: AC
  Filled 2019-11-15: qty 1000

## 2019-11-15 MED ORDER — SODIUM CHLORIDE 0.9 % IV SOLN
430.0000 mg | Freq: Once | INTRAVENOUS | Status: AC
  Administered 2019-11-15: 430 mg via INTRAVENOUS
  Filled 2019-11-15: qty 43

## 2019-11-15 MED ORDER — SODIUM CHLORIDE 0.9 % IV SOLN
200.0000 mg | Freq: Once | INTRAVENOUS | Status: AC
  Administered 2019-11-15: 200 mg via INTRAVENOUS
  Filled 2019-11-15: qty 8

## 2019-11-15 MED ORDER — CYANOCOBALAMIN 1000 MCG/ML IJ SOLN
1000.0000 ug | Freq: Once | INTRAMUSCULAR | Status: AC
  Administered 2019-11-15: 1000 ug via INTRAMUSCULAR

## 2019-11-15 MED FILL — POTASSIUM CHLORIDE CRYS ER: 20 | 7 days supply | Qty: 14 | Fill #0

## 2019-11-15 MED FILL — PROCHLORPERAZINE 10 MG TAB: 10 | 7 days supply | Qty: 30 | Fill #0

## 2019-11-15 NOTE — Patient Instructions (Signed)
[place] Discharge Instructions for Patients Receiving Chemotherapy  Today you received the following chemotherapy agents Pembrolizumab (KEYTRUDA), Pemetrexed (ALIMTA) & Carboplatin (PARAPLATIN).  To help prevent nausea and vomiting after your treatment, we encourage you to take your nausea medication as prescribed.   If you develop nausea and vomiting that is not controlled by your nausea medication, call the clinic.   BELOW ARE SYMPTOMS THAT SHOULD BE REPORTED IMMEDIATELY:  *FEVER GREATER THAN 100.5 F  *CHILLS WITH OR WITHOUT FEVER  NAUSEA AND VOMITING THAT IS NOT CONTROLLED WITH YOUR NAUSEA MEDICATION  *UNUSUAL SHORTNESS OF BREATH  *UNUSUAL BRUISING OR BLEEDING  TENDERNESS IN MOUTH AND THROAT WITH OR WITHOUT PRESENCE OF ULCERS  *URINARY PROBLEMS  *BOWEL PROBLEMS  UNUSUAL RASH Items with * indicate a potential emergency and should be followed up as soon as possible.  Feel free to call the clinic should you have any questions or concerns. The clinic phone number is [phone].  Please show the [facility] at check-in to the Emergency Department and triage nurse.  Pembrolizumab injection What is this medicine? PEMBROLIZUMAB (pem broe liz ue mab) is a monoclonal antibody. It is used to treat certain types of cancer. This medicine may be used for other purposes; ask your health care provider or pharmacist if you have questions. COMMON BRAND NAME(S): Keytruda What should I tell my health care provider before I take this medicine? They need to know if you have any of these conditions:  diabetes  immune system problems  inflammatory bowel disease  liver disease  lung or breathing disease  lupus  received or scheduled to receive an organ transplant or a stem-cell transplant that uses donor stem cells  an unusual or allergic reaction to pembrolizumab, other medicines, foods, dyes, or preservatives  pregnant or trying to get  pregnant  breast-feeding How should I use this medicine? This medicine is for infusion into a vein. It is given by a health care professional in a hospital or clinic setting. A special MedGuide will be given to you before each treatment. Be sure to read this information carefully each time. Talk to your pediatrician regarding the use of this medicine in children. While this drug may be prescribed for children as young as 6 months for selected conditions, precautions do apply. Overdosage: If you think you have taken too much of this medicine contact a poison control center or emergency room at once. NOTE: This medicine is only for you. Do not share this medicine with others. What if I miss a dose? It is important not to miss your dose. Call your doctor or health care professional if you are unable to keep an appointment. What may interact with this medicine? Interactions have not been studied. Give your health care provider a list of all the medicines, herbs, non-prescription drugs, or dietary supplements you use. Also tell them if you smoke, drink alcohol, or use illegal drugs. Some items may interact with your medicine. This list may not describe all possible interactions. Give your health care provider a list of all the medicines, herbs, non-prescription drugs, or dietary supplements you use. Also tell them if you smoke, drink alcohol, or use illegal drugs. Some items may interact with your medicine. What should I watch for while using this medicine? Your condition will be monitored carefully while you are receiving this medicine. You may need blood work done while you are taking this medicine. Do not become pregnant while taking this medicine or for 4  months after stopping it. Women should inform their doctor if they wish to become pregnant or think they might be pregnant. There is a potential for serious side effects to an unborn child. Talk to your health care professional or pharmacist for  more information. Do not breast-feed an infant while taking this medicine or for 4 months after the last dose. What side effects may I notice from receiving this medicine? Side effects that you should report to your doctor or health care professional as soon as possible:  allergic reactions like skin rash, itching or hives, swelling of the face, lips, or tongue  bloody or black, tarry  breathing problems  changes in vision  chest pain  chills  confusion  constipation  cough  diarrhea  dizziness or feeling faint or lightheaded  fast or irregular heartbeat  fever  flushing  joint pain  low blood counts - this medicine may decrease the number of white blood cells, red blood cells and platelets. You may be at increased risk for infections and bleeding.  muscle pain  muscle weakness  pain, tingling, numbness in the hands or feet  persistent headache  redness, blistering, peeling or loosening of the skin, including inside the mouth  signs and symptoms of high blood sugar such as dizziness; dry mouth; dry skin; fruity breath; nausea; stomach pain; increased hunger or thirst; increased urination  signs and symptoms of kidney injury like trouble passing urine or change in the amount of urine  signs and symptoms of liver injury like dark urine, light-colored stools, loss of appetite, nausea, right upper belly pain, yellowing of the eyes or skin  sweating  swollen lymph nodes  weight loss Side effects that usually do not require medical attention (report to your doctor or health care professional if they continue or are bothersome):  decreased appetite  hair loss  muscle pain  tiredness This list may not describe all possible side effects. Call your doctor for medical advice about side effects. You may report side effects to FDA at 1-800-FDA-1088. Where should I keep my medicine? This drug is given in a hospital or clinic and will not be stored at home. NOTE:  This sheet is a summary. It may not cover all possible information. If you have questions about this medicine, talk to your doctor, pharmacist, or health care provider.  2020 Elsevier/Gold Standard (2019-01-13 18:07:58)  Pemetrexed injection What is this medicine? PEMETREXED (PEM e TREX ed) is a chemotherapy drug used to treat lung cancers like non-small cell lung cancer and mesothelioma. It may also be used to treat other cancers. This medicine may be used for other purposes; ask your health care provider or pharmacist if you have questions. COMMON BRAND NAME(S): Alimta What should I tell my health care provider before I take this medicine? They need to know if you have any of these conditions:  infection (especially a virus infection such as chickenpox, cold sores, or herpes)  kidney disease  low blood counts, like low white cell, platelet, or red cell counts  lung or breathing disease, like asthma  radiation therapy  an unusual or allergic reaction to pemetrexed, other medicines, foods, dyes, or preservative  pregnant or trying to get pregnant  breast-feeding How should I use this medicine? This drug is given as an infusion into a vein. It is administered in a hospital or clinic by a specially trained health care professional. Talk to your pediatrician regarding the use of this medicine in children. Special care may  be needed. Overdosage: If you think you have taken too much of this medicine contact a poison control center or emergency room at once. NOTE: This medicine is only for you. Do not share this medicine with others. What if I miss a dose? It is important not to miss your dose. Call your doctor or health care professional if you are unable to keep an appointment. What may interact with this medicine? This medicine may interact with the following medications:  Ibuprofen This list may not describe all possible interactions. Give your health care provider a list of all  the medicines, herbs, non-prescription drugs, or dietary supplements you use. Also tell them if you smoke, drink alcohol, or use illegal drugs. Some items may interact with your medicine. What should I watch for while using this medicine? Visit your doctor for checks on your progress. This drug may make you feel generally unwell. This is not uncommon, as chemotherapy can affect healthy cells as well as cancer cells. Report any side effects. Continue your course of treatment even though you feel ill unless your doctor tells you to stop. In some cases, you may be given additional medicines to help with side effects. Follow all directions for their use. Call your doctor or health care professional for advice if you get a fever, chills or sore throat, or other symptoms of a cold or flu. Do not treat yourself. This drug decreases your body's ability to fight infections. Try to avoid being around people who are sick. This medicine may increase your risk to bruise or bleed. Call your doctor or health care professional if you notice any unusual bleeding. Be careful brushing and flossing your teeth or using a toothpick because you may get an infection or bleed more easily. If you have any dental work done, tell your dentist you are receiving this medicine. Avoid taking products that contain aspirin, acetaminophen, ibuprofen, naproxen, or ketoprofen unless instructed by your doctor. These medicines may hide a fever. Call your doctor or health care professional if you get diarrhea or mouth sores. Do not treat yourself. To protect your kidneys, drink water or other fluids as directed while you are taking this medicine. Do not become pregnant while taking this medicine or for 6 months after stopping it. Women should inform their doctor if they wish to become pregnant or think they might be pregnant. Men should not father a child while taking this medicine and for 3 months after stopping it. This may interfere with the  ability to father a child. You should talk to your doctor or health care professional if you are concerned about your fertility. There is a potential for serious side effects to an unborn child. Talk to your health care professional or pharmacist for more information. Do not breast-feed an infant while taking this medicine or for 1 week after stopping it. What side effects may I notice from receiving this medicine? Side effects that you should report to your doctor or health care professional as soon as possible:  allergic reactions like skin rash, itching or hives, swelling of the face, lips, or tongue  breathing problems  redness, blistering, peeling or loosening of the skin, including inside the mouth  signs and symptoms of bleeding such as bloody or black, tarry stools; red or dark-brown urine; spitting up blood or brown material that looks like coffee grounds; red spots on the skin; unusual bruising or bleeding from the eye, gums, or nose  signs and symptoms of infection like  fever or chills; cough; sore throat; pain or trouble passing urine  signs and symptoms of kidney injury like trouble passing urine or change in the amount of urine  signs and symptoms of liver injury like dark yellow or brown urine; general ill feeling or flu-like symptoms; light-colored stools; loss of appetite; nausea; right upper belly pain; unusually weak or tired; yellowing of the eyes or skin Side effects that usually do not require medical attention (report to your doctor or health care professional if they continue or are bothersome):  constipation  mouth sores  nausea, vomiting  unusually weak or tired This list may not describe all possible side effects. Call your doctor for medical advice about side effects. You may report side effects to FDA at 1-800-FDA-1088. Where should I keep my medicine? This drug is given in a hospital or clinic and will not be stored at home. NOTE: This sheet is a summary. It  may not cover all possible information. If you have questions about this medicine, talk to your doctor, pharmacist, or health care provider.  2020 Elsevier/Gold Standard (2017-04-28 16:11:33)  Carboplatin injection What is this medicine? CARBOPLATIN (KAR boe pla tin) is a chemotherapy drug. It targets fast dividing cells, like cancer cells, and causes these cells to die. This medicine is used to treat ovarian cancer and many other cancers. This medicine may be used for other purposes; ask your health care provider or pharmacist if you have questions. COMMON BRAND NAME(S): Paraplatin What should I tell my health care provider before I take this medicine? They need to know if you have any of these conditions:  blood disorders  hearing problems  kidney disease  recent or ongoing radiation therapy  an unusual or allergic reaction to carboplatin, cisplatin, other chemotherapy, other medicines, foods, dyes, or preservatives  pregnant or trying to get pregnant  breast-feeding How should I use this medicine? This drug is usually given as an infusion into a vein. It is administered in a hospital or clinic by a specially trained health care professional. Talk to your pediatrician regarding the use of this medicine in children. Special care may be needed. Overdosage: If you think you have taken too much of this medicine contact a poison control center or emergency room at once. NOTE: This medicine is only for you. Do not share this medicine with others. What if I miss a dose? It is important not to miss a dose. Call your doctor or health care professional if you are unable to keep an appointment. What may interact with this medicine?  medicines for seizures  medicines to increase blood counts like filgrastim, pegfilgrastim, sargramostim  some antibiotics like amikacin, gentamicin, neomycin, streptomycin, tobramycin  vaccines Talk to your doctor or health care professional before taking  any of these medicines:  acetaminophen  aspirin  ibuprofen  ketoprofen  naproxen This list may not describe all possible interactions. Give your health care provider a list of all the medicines, herbs, non-prescription drugs, or dietary supplements you use. Also tell them if you smoke, drink alcohol, or use illegal drugs. Some items may interact with your medicine. What should I watch for while using this medicine? Your condition will be monitored carefully while you are receiving this medicine. You will need important blood work done while you are taking this medicine. This drug may make you feel generally unwell. This is not uncommon, as chemotherapy can affect healthy cells as well as cancer cells. Report any side effects. Continue your course of  treatment even though you feel ill unless your doctor tells you to stop. In some cases, you may be given additional medicines to help with side effects. Follow all directions for their use. Call your doctor or health care professional for advice if you get a fever, chills or sore throat, or other symptoms of a cold or flu. Do not treat yourself. This drug decreases your body's ability to fight infections. Try to avoid being around people who are sick. This medicine may increase your risk to bruise or bleed. Call your doctor or health care professional if you notice any unusual bleeding. Be careful brushing and flossing your teeth or using a toothpick because you may get an infection or bleed more easily. If you have any dental work done, tell your dentist you are receiving this medicine. Avoid taking products that contain aspirin, acetaminophen, ibuprofen, naproxen, or ketoprofen unless instructed by your doctor. These medicines may hide a fever. Do not become pregnant while taking this medicine. Women should inform their doctor if they wish to become pregnant or think they might be pregnant. There is a potential for serious side effects to an unborn  child. Talk to your health care professional or pharmacist for more information. Do not breast-feed an infant while taking this medicine. What side effects may I notice from receiving this medicine? Side effects that you should report to your doctor or health care professional as soon as possible:  allergic reactions like skin rash, itching or hives, swelling of the face, lips, or tongue  signs of infection - fever or chills, cough, sore throat, pain or difficulty passing urine  signs of decreased platelets or bleeding - bruising, pinpoint red spots on the skin, black, tarry stools, nosebleeds  signs of decreased red blood cells - unusually weak or tired, fainting spells, lightheadedness  breathing problems  changes in hearing  changes in vision  chest pain  high blood pressure  low blood counts - This drug may decrease the number of white blood cells, red blood cells and platelets. You may be at increased risk for infections and bleeding.  nausea and vomiting  pain, swelling, redness or irritation at the injection site  pain, tingling, numbness in the hands or feet  problems with balance, talking, walking  trouble passing urine or change in the amount of urine Side effects that usually do not require medical attention (report to your doctor or health care professional if they continue or are bothersome):  hair loss  loss of appetite  metallic taste in the mouth or changes in taste This list may not describe all possible side effects. Call your doctor for medical advice about side effects. You may report side effects to FDA at 1-800-FDA-1088. Where should I keep my medicine? This drug is given in a hospital or clinic and will not be stored at home. NOTE: This sheet is a summary. It may not cover all possible information. If you have questions about this medicine, talk to your doctor, pharmacist, or health care provider.  2020 Elsevier/Gold Standard (2007-06-14  14:38:05)

## 2019-11-15 NOTE — Progress Notes (Signed)
AST 83. Okay to treat per [place], [place]

## 2019-11-17 ENCOUNTER — Other Ambulatory Visit: Payer: Self-pay

## 2019-11-17 ENCOUNTER — Inpatient Hospital Stay: Payer: Medicaid Other

## 2019-11-17 VITALS — BP 106/77 | HR 95 | Temp 98.4°F | Resp 20

## 2019-11-17 DIAGNOSIS — Z5112 Encounter for antineoplastic immunotherapy: Secondary | ICD-10-CM | POA: Diagnosis not present

## 2019-11-17 DIAGNOSIS — C3491 Malignant neoplasm of unspecified part of right bronchus or lung: Secondary | ICD-10-CM

## 2019-11-17 MED ORDER — PEGFILGRASTIM-CBQV 6 MG/0.6ML ~~LOC~~ SOSY
6.0000 mg | PREFILLED_SYRINGE | Freq: Once | SUBCUTANEOUS | Status: AC
  Administered 2019-11-17: 6 mg via SUBCUTANEOUS

## 2019-11-17 MED ORDER — PEGFILGRASTIM-CBQV 6 MG/0.6ML ~~LOC~~ SOSY
PREFILLED_SYRINGE | SUBCUTANEOUS | Status: AC
  Filled 2019-11-17: qty 0.6

## 2019-11-17 NOTE — Patient Instructions (Signed)

## 2019-11-30 ENCOUNTER — Other Ambulatory Visit: Payer: Self-pay | Admitting: Physician Assistant

## 2019-11-30 ENCOUNTER — Telehealth: Payer: Self-pay | Admitting: *Deleted

## 2019-11-30 ENCOUNTER — Encounter (HOSPITAL_COMMUNITY): Payer: Self-pay

## 2019-11-30 ENCOUNTER — Ambulatory Visit (HOSPITAL_COMMUNITY)
Admission: RE | Admit: 2019-11-30 | Discharge: 2019-11-30 | Disposition: A | Discharge status: Home / Self Care | Payer: Medicaid Other | Source: Ambulatory Visit | Attending: Physician Assistant | Admitting: Physician Assistant

## 2019-11-30 ENCOUNTER — Other Ambulatory Visit: Payer: Self-pay

## 2019-11-30 DIAGNOSIS — N12 Tubulo-interstitial nephritis, not specified as acute or chronic: Secondary | ICD-10-CM

## 2019-11-30 DIAGNOSIS — Z5111 Encounter for antineoplastic chemotherapy: Secondary | ICD-10-CM | POA: Diagnosis not present

## 2019-11-30 IMAGING — CT CT ABD-PELV W/ CM
2 of 5 series · 12 of 36 positions shown, 15 images · IV contrast (OMNIPAQUE)
Comparison: [DATE] PET-CT.  CT chest [DATE].

CLINICAL DATA: Primary Cancer Type: Lung
TECHNIQUE: Multidetector CT imaging of the chest, abdomen and pelvis was
performed following the standard protocol during bolus
administration of intravenous contrast.

CONTRAST:  100mL OMNIPAQUE IOHEXOL 300 MG/ML  SOLN

[Series 2: cap with · axial · 0.66mm/px · z∈[+1101,+1616]mm · 9 of 127 slices shown, 12 images]
[im 12/127  mediastinal]
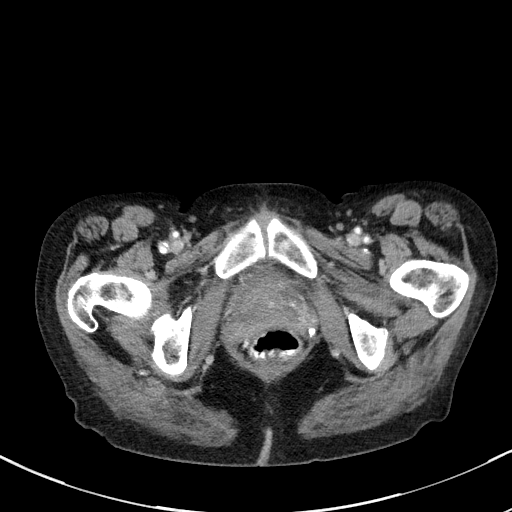
[im 12/127  lung]
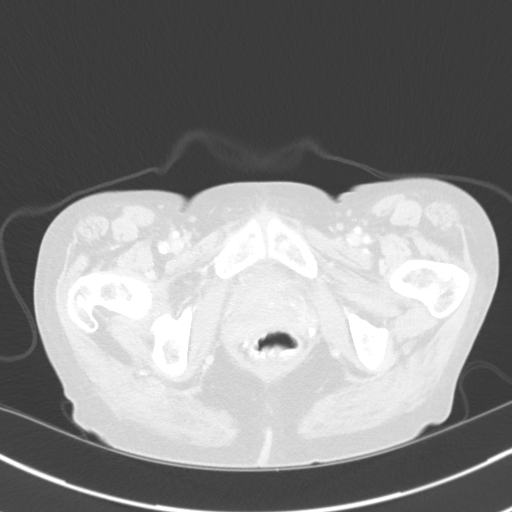
[im 23/127  lung]
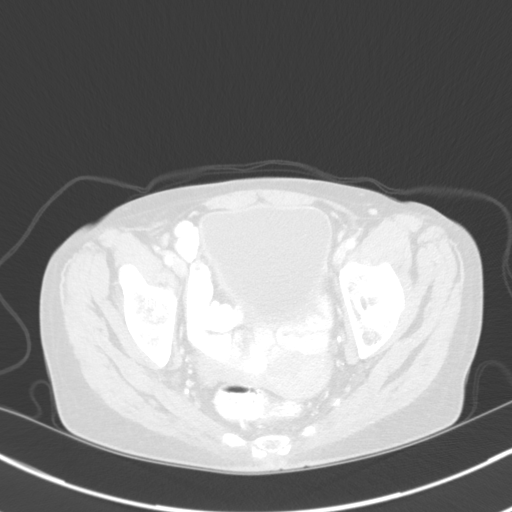
[im 35/127  lung]
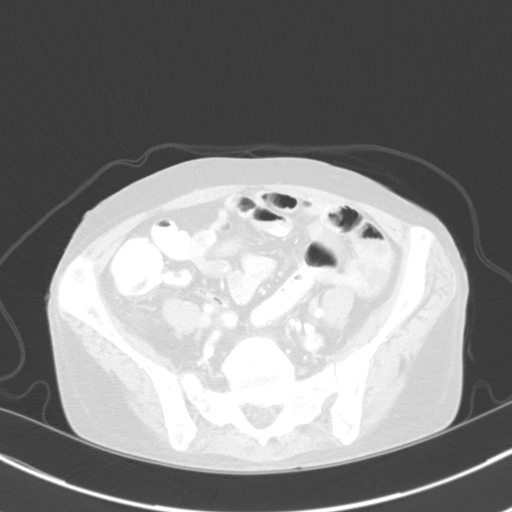
[im 46/127  lung]
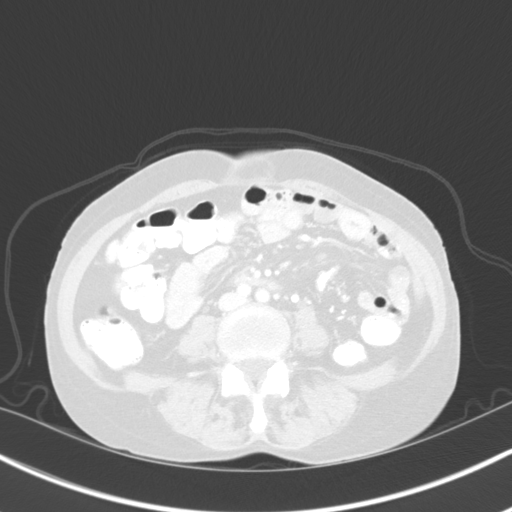
[im 69/127  mediastinal]
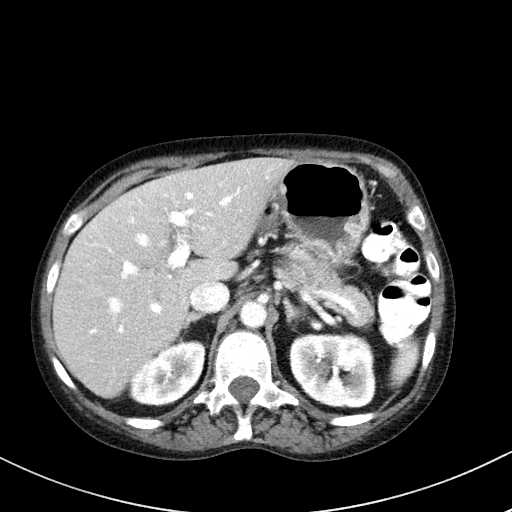
[im 69/127  lung]
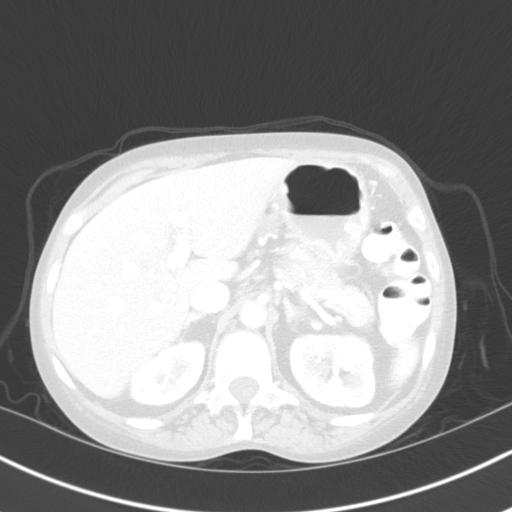
[im 81/127  lung]
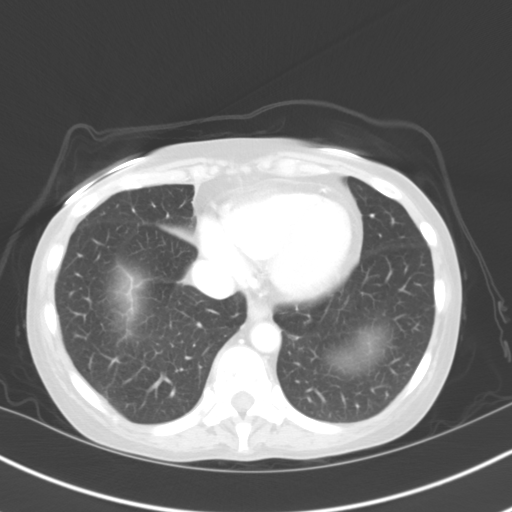
[im 92/127  lung]
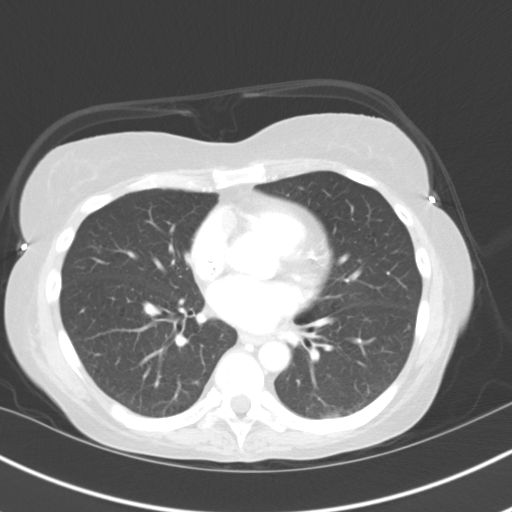
[im 104/127  lung]
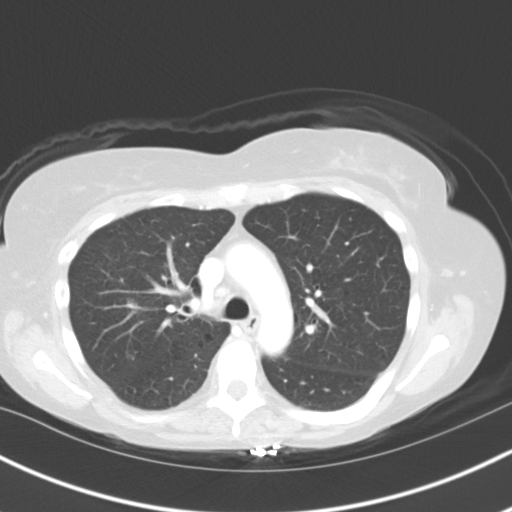
[im 115/127  mediastinal]
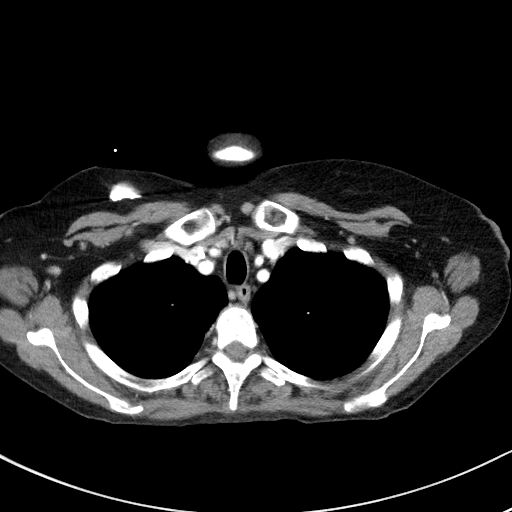
[im 115/127  lung]
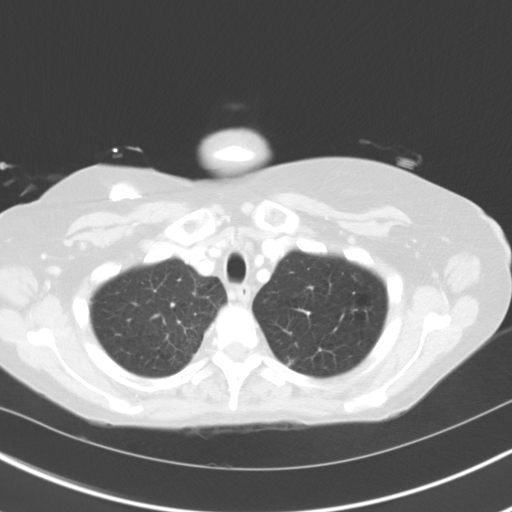

[Series 4: coronals · coronal · 0.91mm/px · 3 of 105 slices shown]
[im 21/105  lung]
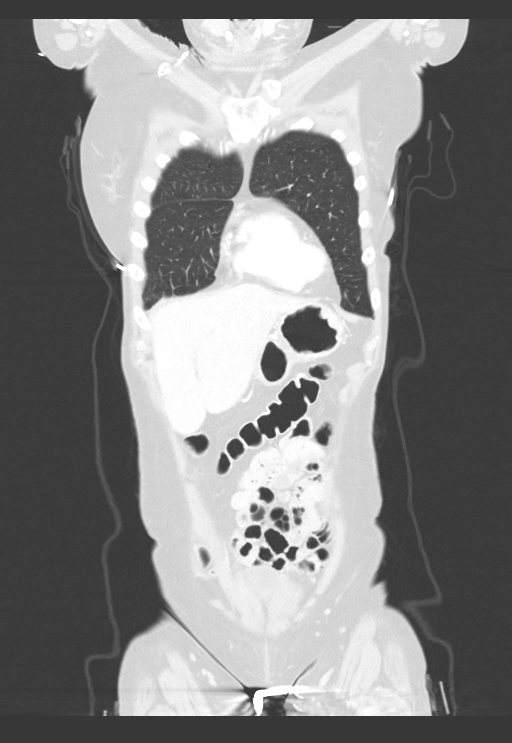
[im 42/105  lung]
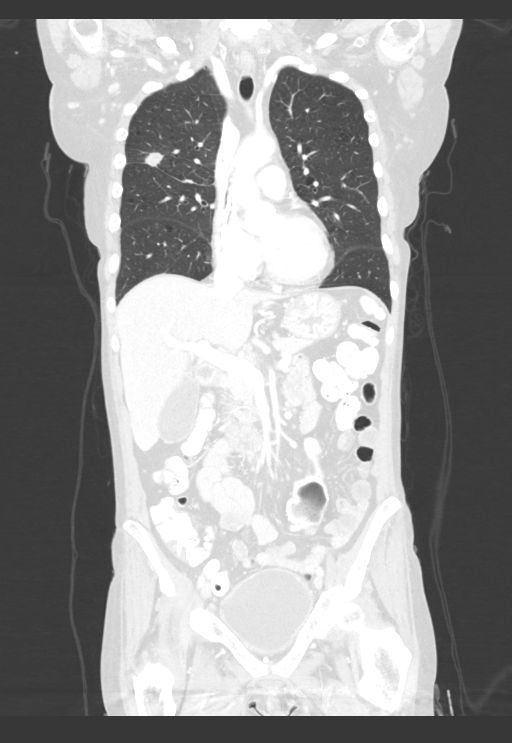
[im 63/105  lung]
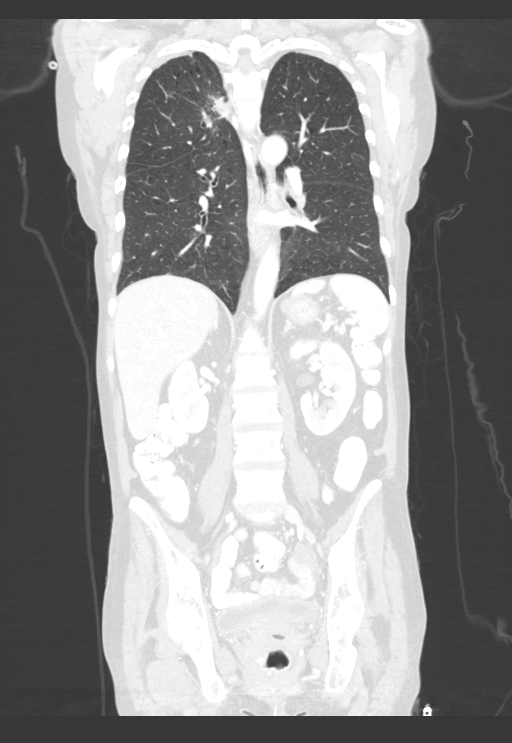

[12 of 36 positions shown; findings below may reference images not displayed]

Imaging Indication: Assess response to therapy

Interval therapy since last imaging? Yes

Initial Cancer Diagnosis

Date: [DATE]; Established by: Biopsy-proven

Detailed Pathology: Stage IV non-small cell lung cancer,
adenocarcinoma.

Primary Tumor location: Right upper lobe.

Surgeries: No

Chemotherapy: Yes; Ongoing? Yes; Most recent administration:
[DATE]

Immunotherapy?  Yes; Type: Keytruda; Ongoing? Yes

Radiation therapy? No

EXAM:
CT CHEST, ABDOMEN, AND PELVIS WITH CONTRAST
FINDINGS: CT CHEST FINDINGS

Cardiovascular: RIGHT-sided Port-A-Cath terminates in the RIGHT
atrium. Aorta is of normal caliber. Heart size normal without
pericardial effusion. Central pulmonary vasculature is unremarkable
on venous phase assessment.

Mediastinum/Nodes: Thoracic inlet structures are normal.

No axillary lymphadenopathy.

No mediastinal lymphadenopathy.

Mild enlargement of RIGHT hilar nodal tissue, not pathologic by size
criteria on CT 11 mm (image 28, series 2) mild fullness of nodal
tissue in this area before, less enlarged however on previous exam
less than a cm. No adenopathy in the mediastinum. Esophagus grossly
normal.

Lungs/Pleura: RIGHT paraspinal mass diminished in size 2.5 x 1.9 cm
(image 31, series 6) along the medial aspect of the RIGHT upper
lobe, previously approximately 3.1 x 2.4 cm.

Nodule along the minor fissure in the RIGHT upper lobe (image 52,
series [DATE] x 1.4 cm within 1-2 mm of measurement on the PET scan
of [DATE]. Adjacent nodular changes seen central to this are
within 1-2 mm as well. Assessment on PET is mildly limited due to
respiratory motion, previous exam was a PET evaluation. Airways are
patent.

11 mm sub solid area in the anterior LEFT upper lobe and another
small area in the posterior LEFT upper lobe are stable.

Musculoskeletal: Subacute rib fractures of RIGHT posterior ribs with
similar appearance. See below for full musculoskeletal detail.

CT ABDOMEN PELVIS FINDINGS

Hepatobiliary: Liver normal in size and contour without focal,
suspicious hepatic lesion. The portal vein is patent. Hepatic veins
are patent. Cholelithiasis without pericholecystic stranding. No
biliary duct dilation.

Pancreas: Pancreas normal without ductal dilation or inflammation.

Spleen: Spleen normal in size and contour without focal lesion.

Adrenals/Urinary Tract: Adrenal glands are normal.

Renal enhancement symmetric on corticomedullary with striated
nephrogram on nephrographic phase. No hydronephrosis. Urinary
bladder unremarkable.

Stomach/Bowel: No acute gastrointestinal process. Is normal
appendix.

Vascular/Lymphatic: Calcified and noncalcified atheromatous plaque
of the abdominal aorta. The There is no gastrohepatic or
hepatoduodenal ligament lymphadenopathy. No retroperitoneal or
mesenteric lymphadenopathy.

No pelvic sidewall lymphadenopathy.

Reproductive: No adnexal mass.

Other: Umbilical and ventral hernias containing fat without change.
No ascites.

Musculoskeletal: No acute musculoskeletal process.
IMPRESSION: 1. nephrogram bilaterally, could be seen in the setting of
pyelonephritis. Correlate with any clinical or laboratory evidence
of urinary tract infection
2. Decrease in size of the RIGHT paraspinal mass along the medial
aspect of the RIGHT upper lobe when compared to previous PET and CT
imaging.
3. Nodules along the minor fissure in the RIGHT upper lobe, dominant
nodule approximately 15 mm, within 1-2 mm of its measurement on the
recent PET and enlarged from 11 mm when compared to the [DATE]
evaluation.
4. LEFT upper lobe nodules with sub solid appearance are unchanged,
dominant area on image 44 series 6 at 11 mm.
5. Mild enlargement of RIGHT hilar nodal tissue, not pathologic by
size criteria on CT criteria on CT 11 mm. Attention on follow-up
given slight enlargement of RIGHT upper lobe nodule along the
fissure as discussed.
6. Subacute rib fractures of RIGHT posterior ribs with similar
appearance. See below for full musculoskeletal detail.
7. Cholelithiasis without evidence of acute cholecystitis.
8. Aortic atherosclerosis.

These results will be called to the ordering clinician or
representative by the Radiologist Assistant, and communication
documented in the PACS or [REDACTED].

Aortic Atherosclerosis ([CO]-[CO]).

## 2019-11-30 IMAGING — CT CT CHEST W/ CM
2 of 5 series · 12 of 36 positions shown, 15 images · IV contrast (OMNIPAQUE)
Comparison: [DATE] PET-CT.  CT chest [DATE].

CLINICAL DATA: Primary Cancer Type: Lung
TECHNIQUE: Multidetector CT imaging of the chest, abdomen and pelvis was
performed following the standard protocol during bolus
administration of intravenous contrast.

CONTRAST:  100mL OMNIPAQUE IOHEXOL 300 MG/ML  SOLN

[Series 2: cap with · axial · 0.66mm/px · z∈[+1101,+1616]mm · 9 of 127 slices shown, 12 images]
[im 12/127  mediastinal]
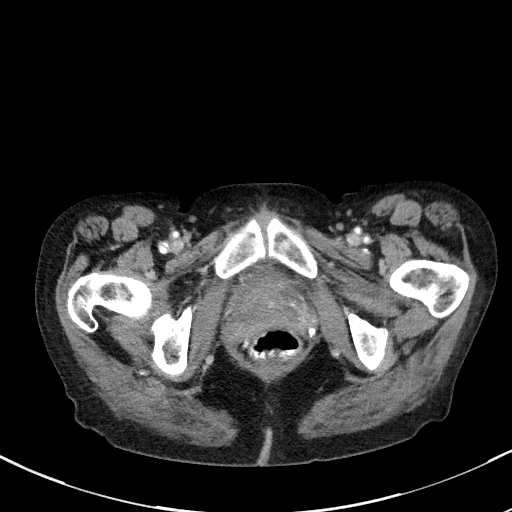
[im 12/127  lung]
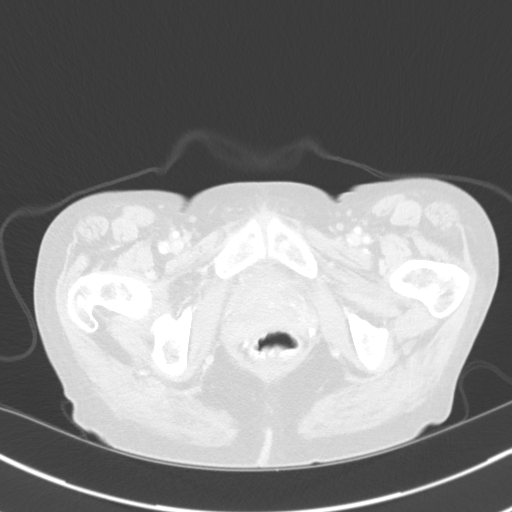
[im 23/127  lung]
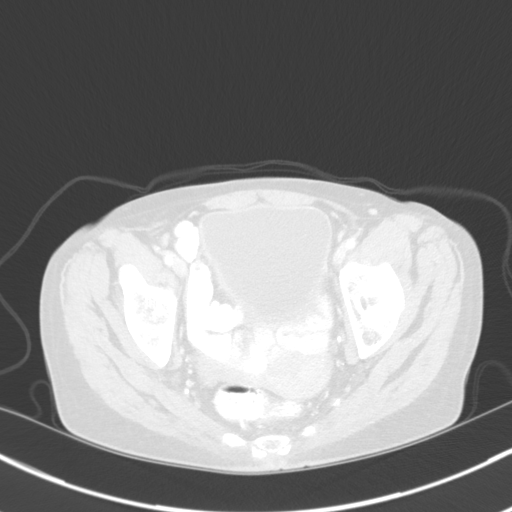
[im 35/127  lung]
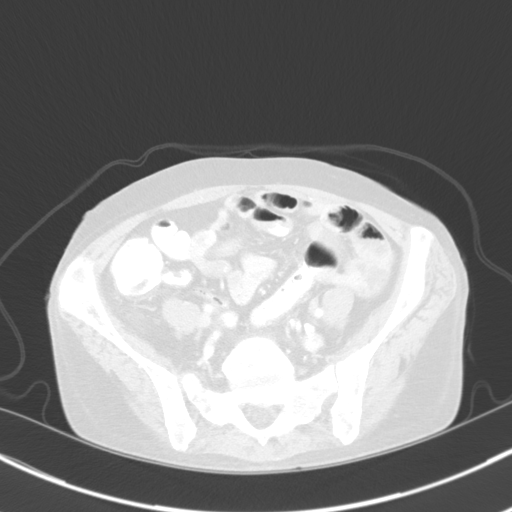
[im 46/127  lung]
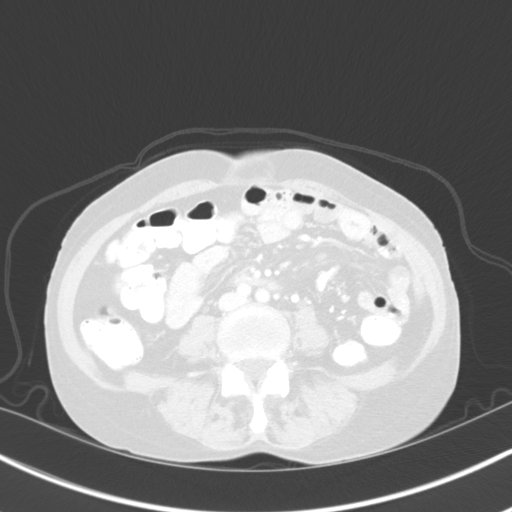
[im 69/127  mediastinal]
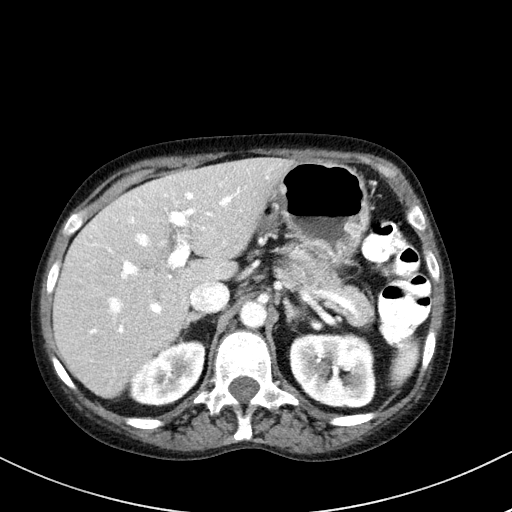
[im 69/127  lung]
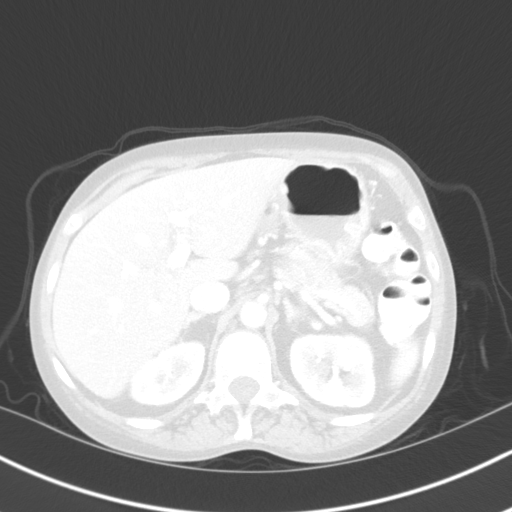
[im 81/127  lung]
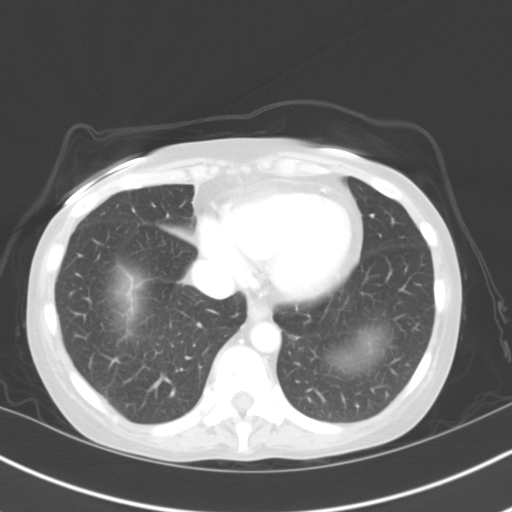
[im 92/127  lung]
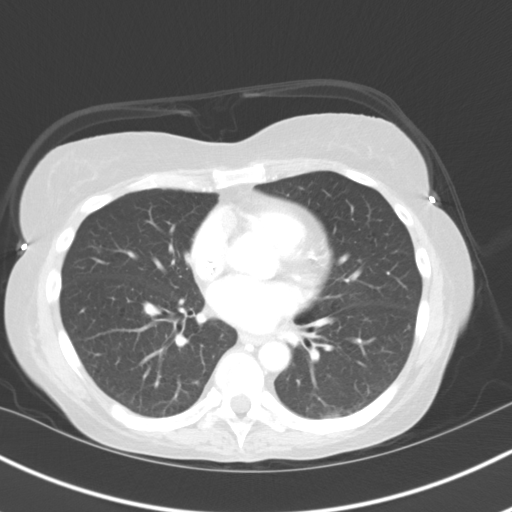
[im 104/127  lung]
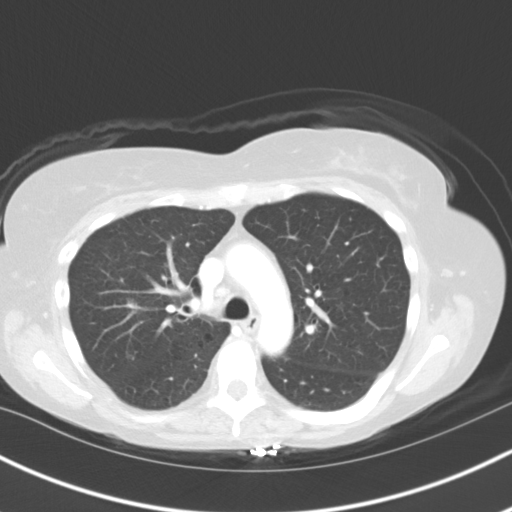
[im 115/127  mediastinal]
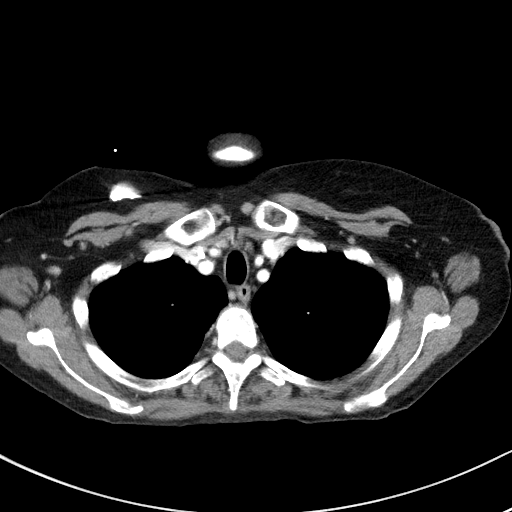
[im 115/127  lung]
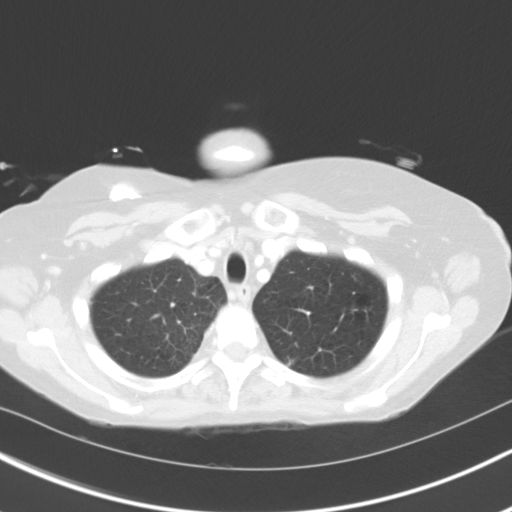

[Series 4: coronals · coronal · 0.91mm/px · 3 of 105 slices shown]
[im 21/105  lung]
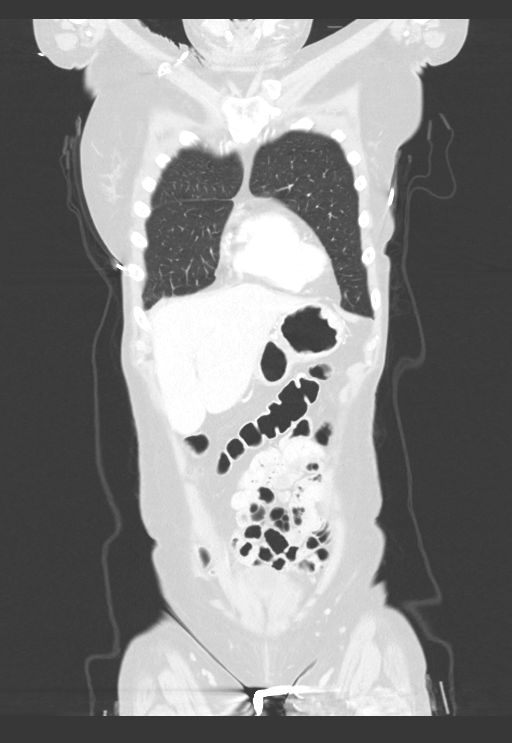
[im 42/105  lung]
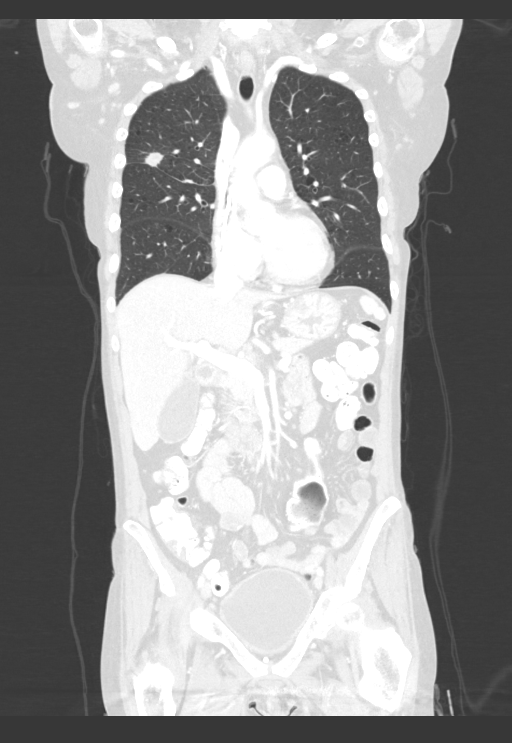
[im 63/105  lung]
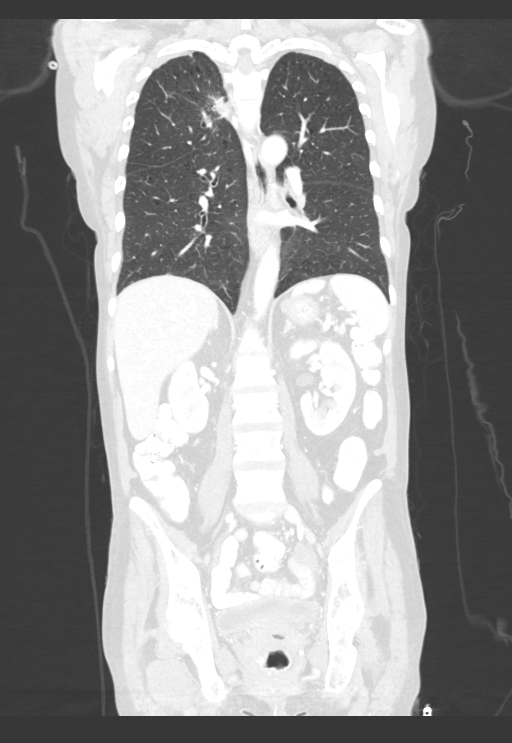

[12 of 36 positions shown; findings below may reference images not displayed]

Imaging Indication: Assess response to therapy

Interval therapy since last imaging? Yes

Initial Cancer Diagnosis

Date: [DATE]; Established by: Biopsy-proven

Detailed Pathology: Stage IV non-small cell lung cancer,
adenocarcinoma.

Primary Tumor location: Right upper lobe.

Surgeries: No

Chemotherapy: Yes; Ongoing? Yes; Most recent administration:
[DATE]

Immunotherapy?  Yes; Type: Keytruda; Ongoing? Yes

Radiation therapy? No

EXAM:
CT CHEST, ABDOMEN, AND PELVIS WITH CONTRAST
FINDINGS: CT CHEST FINDINGS

Cardiovascular: RIGHT-sided Port-A-Cath terminates in the RIGHT
atrium. Aorta is of normal caliber. Heart size normal without
pericardial effusion. Central pulmonary vasculature is unremarkable
on venous phase assessment.

Mediastinum/Nodes: Thoracic inlet structures are normal.

No axillary lymphadenopathy.

No mediastinal lymphadenopathy.

Mild enlargement of RIGHT hilar nodal tissue, not pathologic by size
criteria on CT 11 mm (image 28, series 2) mild fullness of nodal
tissue in this area before, less enlarged however on previous exam
less than a cm. No adenopathy in the mediastinum. Esophagus grossly
normal.

Lungs/Pleura: RIGHT paraspinal mass diminished in size 2.5 x 1.9 cm
(image 31, series 6) along the medial aspect of the RIGHT upper
lobe, previously approximately 3.1 x 2.4 cm.

Nodule along the minor fissure in the RIGHT upper lobe (image 52,
series [DATE] x 1.4 cm within 1-2 mm of measurement on the PET scan
of [DATE]. Adjacent nodular changes seen central to this are
within 1-2 mm as well. Assessment on PET is mildly limited due to
respiratory motion, previous exam was a PET evaluation. Airways are
patent.

11 mm sub solid area in the anterior LEFT upper lobe and another
small area in the posterior LEFT upper lobe are stable.

Musculoskeletal: Subacute rib fractures of RIGHT posterior ribs with
similar appearance. See below for full musculoskeletal detail.

CT ABDOMEN PELVIS FINDINGS

Hepatobiliary: Liver normal in size and contour without focal,
suspicious hepatic lesion. The portal vein is patent. Hepatic veins
are patent. Cholelithiasis without pericholecystic stranding. No
biliary duct dilation.

Pancreas: Pancreas normal without ductal dilation or inflammation.

Spleen: Spleen normal in size and contour without focal lesion.

Adrenals/Urinary Tract: Adrenal glands are normal.

Renal enhancement symmetric on corticomedullary with striated
nephrogram on nephrographic phase. No hydronephrosis. Urinary
bladder unremarkable.

Stomach/Bowel: No acute gastrointestinal process. Is normal
appendix.

Vascular/Lymphatic: Calcified and noncalcified atheromatous plaque
of the abdominal aorta. The There is no gastrohepatic or
hepatoduodenal ligament lymphadenopathy. No retroperitoneal or
mesenteric lymphadenopathy.

No pelvic sidewall lymphadenopathy.

Reproductive: No adnexal mass.

Other: Umbilical and ventral hernias containing fat without change.
No ascites.

Musculoskeletal: No acute musculoskeletal process.
IMPRESSION: 1. nephrogram bilaterally, could be seen in the setting of
pyelonephritis. Correlate with any clinical or laboratory evidence
of urinary tract infection
2. Decrease in size of the RIGHT paraspinal mass along the medial
aspect of the RIGHT upper lobe when compared to previous PET and CT
imaging.
3. Nodules along the minor fissure in the RIGHT upper lobe, dominant
nodule approximately 15 mm, within 1-2 mm of its measurement on the
recent PET and enlarged from 11 mm when compared to the [DATE]
evaluation.
4. LEFT upper lobe nodules with sub solid appearance are unchanged,
dominant area on image 44 series 6 at 11 mm.
5. Mild enlargement of RIGHT hilar nodal tissue, not pathologic by
size criteria on CT criteria on CT 11 mm. Attention on follow-up
given slight enlargement of RIGHT upper lobe nodule along the
fissure as discussed.
6. Subacute rib fractures of RIGHT posterior ribs with similar
appearance. See below for full musculoskeletal detail.
7. Cholelithiasis without evidence of acute cholecystitis.
8. Aortic atherosclerosis.

These results will be called to the ordering clinician or
representative by the Radiologist Assistant, and communication
documented in the PACS or [REDACTED].

Aortic Atherosclerosis ([CO]-[CO]).

## 2019-11-30 MED ORDER — HEPARIN SOD (PORK) LOCK FLUSH 100 UNIT/ML IV SOLN: 500.0000 [IU] | Freq: Once | INTRAVENOUS | Status: AC

## 2019-11-30 MED ORDER — HEPARIN SOD (PORK) LOCK FLUSH 100 UNIT/ML IV SOLN
INTRAVENOUS | Status: AC
  Administered 2019-11-30: 500 [IU] via INTRAVENOUS
  Filled 2019-11-30: qty 5

## 2019-11-30 MED ORDER — IOHEXOL 300 MG/ML  SOLN
100.0000 mL | Freq: Once | INTRAMUSCULAR | Status: AC | PRN
  Administered 2019-11-30: 100 mL via INTRAVENOUS

## 2019-11-30 MED ORDER — CIPROFLOXACIN HCL 500 MG PO TABS: 500.0000 mg | ORAL_TABLET | Freq: Two times a day (BID) | ORAL | 0 refills | Status: DC

## 2019-11-30 MED FILL — CIPROFLOXACIN HCL 500 MG TA: 500 | 5 days supply | Qty: 10 | Fill #0

## 2019-11-30 NOTE — Telephone Encounter (Signed)
Recent review of CT results were relayed to patient per request from [facility],  [place].  Antibiotic was ordered and patient was instructed to take and if she should develop fever, n/v, pain in back then she should go to emergency room.

## 2019-12-01 ENCOUNTER — Telehealth: Payer: Self-pay | Admitting: Internal Medicine

## 2019-12-01 ENCOUNTER — Ambulatory Visit (HOSPITAL_COMMUNITY): Payer: Self-pay

## 2019-12-01 NOTE — Telephone Encounter (Signed)
Scheduled appt per 9/09 sch msg- pt niece is aware of apt.

## 2019-12-05 ENCOUNTER — Inpatient Hospital Stay: Payer: Medicaid Other

## 2019-12-05 ENCOUNTER — Other Ambulatory Visit: Payer: Self-pay | Admitting: Internal Medicine

## 2019-12-05 ENCOUNTER — Encounter: Payer: Self-pay | Admitting: Internal Medicine

## 2019-12-05 ENCOUNTER — Telehealth: Payer: Self-pay | Admitting: Medical Oncology

## 2019-12-05 ENCOUNTER — Inpatient Hospital Stay: Payer: Medicaid Other | Attending: Internal Medicine | Admitting: Internal Medicine

## 2019-12-05 ENCOUNTER — Other Ambulatory Visit: Payer: Self-pay

## 2019-12-05 VITALS — BP 143/88 | HR 99 | Temp 98.6°F | Resp 20 | Ht 66.0 in | Wt 112.0 lb

## 2019-12-05 DIAGNOSIS — C3491 Malignant neoplasm of unspecified part of right bronchus or lung: Secondary | ICD-10-CM

## 2019-12-05 DIAGNOSIS — Z5112 Encounter for antineoplastic immunotherapy: Secondary | ICD-10-CM | POA: Diagnosis not present

## 2019-12-05 DIAGNOSIS — R5383 Other fatigue: Secondary | ICD-10-CM | POA: Diagnosis not present

## 2019-12-05 DIAGNOSIS — C3411 Malignant neoplasm of upper lobe, right bronchus or lung: Secondary | ICD-10-CM | POA: Insufficient documentation

## 2019-12-05 DIAGNOSIS — Z79899 Other long term (current) drug therapy: Secondary | ICD-10-CM | POA: Diagnosis not present

## 2019-12-05 DIAGNOSIS — C775 Secondary and unspecified malignant neoplasm of intrapelvic lymph nodes: Secondary | ICD-10-CM | POA: Insufficient documentation

## 2019-12-05 DIAGNOSIS — R110 Nausea: Secondary | ICD-10-CM | POA: Insufficient documentation

## 2019-12-05 DIAGNOSIS — I2510 Atherosclerotic heart disease of native coronary artery without angina pectoris: Secondary | ICD-10-CM | POA: Diagnosis not present

## 2019-12-05 DIAGNOSIS — Z5111 Encounter for antineoplastic chemotherapy: Secondary | ICD-10-CM | POA: Diagnosis present

## 2019-12-05 DIAGNOSIS — Z95828 Presence of other vascular implants and grafts: Secondary | ICD-10-CM

## 2019-12-05 DIAGNOSIS — I700 Atherosclerosis of aorta: Secondary | ICD-10-CM | POA: Diagnosis not present

## 2019-12-05 LAB — CMP (CANCER CENTER ONLY)
ALT: 24 U/L (ref 0–44)
AST: 44 U/L — ABNORMAL HIGH (ref 15–41)
Albumin: 3 g/dL — ABNORMAL LOW (ref 3.5–5.0)
Alkaline Phosphatase: 195 U/L — ABNORMAL HIGH (ref 38–126)
Anion gap: 12 (ref 5–15)
BUN: <4 mg/dL — ABNORMAL LOW (ref 6–20)
CO2: 20 mmol/L — ABNORMAL LOW (ref 22–32)
Calcium: 8.3 mg/dL — ABNORMAL LOW (ref 8.9–10.3)
Chloride: 106 mmol/L (ref 98–111)
Creatinine: 0.73 mg/dL (ref 0.44–1.00)
GFR, Est AFR Am: >60 mL/min (ref >60)
GFR, Estimated: >60 mL/min (ref >60)
Glucose, Bld: 79 mg/dL (ref 70–99)
Potassium: 4 mmol/L (ref 3.5–5.1)
Sodium: 138 mmol/L (ref 135–145)
Total Bilirubin: <0.2 mg/dL — ABNORMAL LOW (ref 0.3–1.2)
Total Protein: 7.3 g/dL (ref 6.5–8.1)

## 2019-12-05 LAB — CBC WITH DIFFERENTIAL (CANCER CENTER ONLY)
Abs Immature Granulocytes: 0.03 10*3/uL (ref 0.00–0.07)
Basophils Absolute: 0 10*3/uL (ref 0.0–0.1)
Basophils Relative: 0 %
Eosinophils Absolute: 0 10*3/uL (ref 0.0–0.5)
Eosinophils Relative: 0 %
HCT: 27.8 % — ABNORMAL LOW (ref 36.0–46.0)
Hemoglobin: 9.6 g/dL — ABNORMAL LOW (ref 12.0–15.0)
Immature Granulocytes: 1 %
Lymphocytes Relative: 26 %
Lymphs Abs: 1.3 10*3/uL (ref 0.7–4.0)
MCH: 34.5 pg — ABNORMAL HIGH (ref 26.0–34.0)
MCHC: 34.5 g/dL (ref 30.0–36.0)
MCV: 100 fL (ref 80.0–100.0)
Monocytes Absolute: 0.8 10*3/uL (ref 0.1–1.0)
Monocytes Relative: 16 %
Neutro Abs: 2.9 10*3/uL (ref 1.7–7.7)
Neutrophils Relative %: 57 %
Platelet Count: 441 10*3/uL — ABNORMAL HIGH (ref 150–400)
RBC: 2.78 MIL/uL — ABNORMAL LOW (ref 3.87–5.11)
RDW: 17.9 % — ABNORMAL HIGH (ref 11.5–15.5)
WBC Count: 5 10*3/uL (ref 4.0–10.5)
nRBC: 0 % (ref 0.0–0.2)

## 2019-12-05 LAB — TSH: TSH: 2.908 u[IU]/mL (ref 0.308–3.960)

## 2019-12-05 MED ORDER — SODIUM CHLORIDE 0.9 % IV SOLN
Freq: Once | INTRAVENOUS | Status: AC
  Filled 2019-12-05: qty 250

## 2019-12-05 MED ORDER — SODIUM CHLORIDE 0.9 % IV SOLN
500.0000 mg/m2 | Freq: Once | INTRAVENOUS | Status: AC
  Administered 2019-12-05: 800 mg via INTRAVENOUS
  Filled 2019-12-05: qty 20

## 2019-12-05 MED ORDER — SODIUM CHLORIDE 0.9% FLUSH
10.0000 mL | INTRAVENOUS | Status: DC | PRN
  Administered 2019-12-05: 10 mL via INTRAVENOUS
  Filled 2019-12-05: qty 10

## 2019-12-05 MED ORDER — SODIUM CHLORIDE 0.9 % IV SOLN
150.0000 mg | Freq: Once | INTRAVENOUS | Status: AC
  Administered 2019-12-05: 150 mg via INTRAVENOUS
  Filled 2019-12-05: qty 150

## 2019-12-05 MED ORDER — SODIUM CHLORIDE 0.9% FLUSH
10.0000 mL | INTRAVENOUS | Status: DC | PRN
  Administered 2019-12-05: 10 mL
  Filled 2019-12-05: qty 10

## 2019-12-05 MED ORDER — HEPARIN SOD (PORK) LOCK FLUSH 100 UNIT/ML IV SOLN
500.0000 [IU] | Freq: Once | INTRAVENOUS | Status: AC | PRN
  Administered 2019-12-05: 500 [IU]
  Filled 2019-12-05: qty 5

## 2019-12-05 MED ORDER — HEPARIN SOD (PORK) LOCK FLUSH 100 UNIT/ML IV SOLN
500.0000 [IU] | Freq: Once | INTRAVENOUS | Status: AC
  Administered 2019-12-05: 500 [IU] via INTRAVENOUS
  Filled 2019-12-05: qty 5

## 2019-12-05 MED ORDER — SODIUM CHLORIDE 0.9 % IV SOLN
430.0000 mg | Freq: Once | INTRAVENOUS | Status: AC
  Administered 2019-12-05: 430 mg via INTRAVENOUS
  Filled 2019-12-05: qty 43

## 2019-12-05 MED ORDER — PALONOSETRON HCL INJECTION 0.25 MG/5ML
INTRAVENOUS | Status: AC
  Filled 2019-12-05: qty 5

## 2019-12-05 MED ORDER — SODIUM CHLORIDE 0.9 % IV SOLN
10.0000 mg | Freq: Once | INTRAVENOUS | Status: AC
  Administered 2019-12-05: 10 mg via INTRAVENOUS
  Filled 2019-12-05: qty 10

## 2019-12-05 MED ORDER — SODIUM CHLORIDE 0.9 % IV SOLN
200.0000 mg | Freq: Once | INTRAVENOUS | Status: AC
  Administered 2019-12-05: 200 mg via INTRAVENOUS
  Filled 2019-12-05: qty 8

## 2019-12-05 MED ORDER — PALONOSETRON HCL INJECTION 0.25 MG/5ML
0.2500 mg | Freq: Once | INTRAVENOUS | Status: AC
  Administered 2019-12-05: 0.25 mg via INTRAVENOUS

## 2019-12-05 NOTE — Patient Instructions (Signed)
Steps to Quit Smoking Smoking tobacco is the leading cause of preventable death. It can affect almost every organ in the body. Smoking puts you and people around you at risk for many serious, long-lasting (chronic) diseases. Quitting smoking can be hard, but it is one of the best things that you can do for your health. It is never too late to quit. How do I get ready to quit? When you decide to quit smoking, make a plan to help you succeed. Before you quit:  Pick a date to quit. Set a date within the next 2 weeks to give you time to prepare.  Write down the reasons why you are quitting. Keep this list in places where you will see it often.  Tell your family, friends, and co-workers that you are quitting. Their support is important.  Talk with your doctor about the choices that may help you quit.  Find out if your health insurance will pay for these treatments.  Know the people, places, things, and activities that make you want to smoke (triggers). Avoid them. What first steps can I take to quit smoking?  Throw away all cigarettes at home, at work, and in your car.  Throw away the things that you use when you smoke, such as ashtrays and lighters.  Clean your car. Make sure to empty the ashtray.  Clean your home, including curtains and carpets. What can I do to help me quit smoking? Talk with your doctor about taking medicines and seeing a counselor at the same time. You are more likely to succeed when you do both.  If you are pregnant or breastfeeding, talk with your doctor about counseling or other ways to quit smoking. Do not take medicine to help you quit smoking unless your doctor tells you to do so. To quit smoking: Quit right away  Quit smoking totally, instead of slowly cutting back on how much you smoke over a period of time.  Go to counseling. You are more likely to quit if you go to counseling sessions regularly. Take medicine You may take medicines to help you quit. Some  medicines need a prescription, and some you can buy over-the-counter. Some medicines may contain a drug called nicotine to replace the nicotine in cigarettes. Medicines may:  Help you to stop having the desire to smoke (cravings).  Help to stop the problems that come when you stop smoking (withdrawal symptoms). Your doctor may ask you to use:  Nicotine patches, gum, or lozenges.  Nicotine inhalers or sprays.  Non-nicotine medicine that is taken by mouth. Find resources Find resources and other ways to help you quit smoking and remain smoke-free after you quit. These resources are most helpful when you use them often. They include:  Online chats with a counselor.  Phone quitlines.  Printed self-help materials.  Support groups or group counseling.  Text messaging programs.  Mobile phone apps. Use apps on your mobile phone or tablet that can help you stick to your quit plan. There are many free apps for mobile phones and tablets as well as websites. Examples include Quit Guide from the CDC and smokefree.gov  What things can I do to make it easier to quit?   Talk to your family and friends. Ask them to support and encourage you.  Call a phone quitline (1-800-QUIT-NOW), reach out to support groups, or work with a counselor.  Ask people who smoke to not smoke around you.  Avoid places that make you want to smoke,   such as: ? Bars. ? Parties. ? Smoke-break areas at work.  Spend time with people who do not smoke.  Lower the stress in your life. Stress can make you want to smoke. Try these things to help your stress: ? Getting regular exercise. ? Doing deep-breathing exercises. ? Doing yoga. ? Meditating. ? Doing a body scan. To do this, close your eyes, focus on one area of your body at a time from head to toe. Notice which parts of your body are tense. Try to relax the muscles in those areas. How will I feel when I quit smoking? Day 1 to 3 weeks Within the first 24 hours,  you may start to have some problems that come from quitting tobacco. These problems are very bad 2-3 days after you quit, but they do not often last for more than 2-3 weeks. You may get these symptoms:  Mood swings.  Feeling restless, nervous, angry, or annoyed.  Trouble concentrating.  Dizziness.  Strong desire for high-sugar foods and nicotine.  Weight gain.  Trouble pooping (constipation).  Feeling like you may vomit (nausea).  Coughing or a sore throat.  Changes in how the medicines that you take for other issues work in your body.  Depression.  Trouble sleeping (insomnia). Week 3 and afterward After the first 2-3 weeks of quitting, you may start to notice more positive results, such as:  Better sense of smell and taste.  Less coughing and sore throat.  Slower heart rate.  Lower blood pressure.  Clearer skin.  Better breathing.  Fewer sick days. Quitting smoking can be hard. Do not give up if you fail the first time. Some people need to try a few times before they succeed. Do your best to stick to your quit plan, and talk with your doctor if you have any questions or concerns. Summary  Smoking tobacco is the leading cause of preventable death. Quitting smoking can be hard, but it is one of the best things that you can do for your health.  When you decide to quit smoking, make a plan to help you succeed.  Quit smoking right away, not slowly over a period of time.  When you start quitting, seek help from your doctor, family, or friends. This information is not intended to replace advice given to you by your health care provider. Make sure you discuss any questions you have with your health care provider. Document Revised: 12/02/2018 Document Reviewed: 05/28/2018 [employer] Patient Education  2020 [employer]  

## 2019-12-05 NOTE — Progress Notes (Signed)
[place] [phone]) [phone]   Fax:([phone]  OFFICE PROGRESS NOTE  System, Pcp Not In No address on file  DIAGNOSIS: Stage IV (T2 a, N2, M1 B) non-small cell lung cancer, adenocarcinoma presented with large right upper lobe lung mass in addition to suspicious right paratracheal lymphadenopathy and bilateral pulmonary nodules as well as retroperitoneal lymphadenopathy diagnosed in May 2021. The patient has no actionable mutations and has negative PD-L1 expression on molecular studies by foundation 1.  PRIOR THERAPY: None  CURRENT THERAPY: Systemic chemotherapy with carboplatin for an AUC of 5, Alimta 500 mg/m2, and Keytruda 200 mg IV every 3 weeks. First dose 09/28/2019. Status post 3 cycles.  INTERVAL HISTORY: Brenda [name] 59 y.o. female returns to the clinic today for follow-up visit.  The patient is feeling fine today with no concerning complaints except for occasional nausea.  She denied having any chest pain, shortness of breath, cough or hemoptysis.  She denied having any fever or chills.  She has no vomiting, abdominal pain, diarrhea or constipation.  She has no significant weight loss or night sweats.  She has no headache or visual changes.  She continues to tolerate her treatment with chemotherapy fairly well.  The patient had repeat CT scan of the chest, abdomen pelvis performed recently and she is here for evaluation and discussion of her scan results.  MEDICAL HISTORY: Past Medical History:  Diagnosis Date  . Breast cancer ([place])   . Depression   . Mental disorder     ALLERGIES:  is allergic to aspirin adult low [aspirin].  MEDICATIONS:  Current Outpatient Medications  Medication Sig Dispense Refill  . calcium carbonate (OSCAL) 1500 (600 Ca) MG TABS tablet Take 600 mg by mouth daily.    . ciprofloxacin (CIPRO) 500 MG tablet Take 1 tablet (500 mg total) by mouth 2 (two) times daily. 10 tablet 0  . folic acid (FOLVITE) 1 MG tablet Take 1  tablet (1 mg total) by mouth daily. 30 tablet 4  . HYDROcodone-acetaminophen (NORCO/VICODIN) 5-325 MG tablet Take 1 tablet by mouth every 4 (four) hours as needed. (Patient not taking: Reported on 09/14/2019) 10 tablet 0  . lidocaine-prilocaine (EMLA) cream Apply 1 application topically as needed. 30 g 2  . oxyCODONE-acetaminophen (PERCOCET/ROXICET) 5-325 MG tablet Take 1 tablet by mouth every 6 (six) hours as needed for severe pain. 30 tablet 0  . potassium chloride SA (KLOR-CON) 20 MEQ tablet Take 1 tablet (20 mEq total) by mouth 2 (two) times daily. 14 tablet 0  . prochlorperazine (COMPAZINE) 10 MG tablet Take 1 tablet (10 mg total) by mouth every 6 (six) hours as needed for nausea or vomiting. 30 tablet 3   No current facility-administered medications for this visit.   Facility-Administered Medications Ordered in Other Visits  Medication Dose Route Frequency Provider Last Rate Last Admin  . sodium chloride flush (NS) 0.9 % injection 10 mL  10 mL Intravenous PRN Curt Bears, MD   10 mL at 12/05/19 1214    SURGICAL HISTORY:  Past Surgical History:  Procedure Laterality Date  . IR IMAGING GUIDED PORT INSERTION  11/03/2019  . TUBAL LIGATION    . VIDEO BRONCHOSCOPY WITH ENDOBRONCHIAL NAVIGATION N/A 04/04/2019   Procedure: VIDEO BRONCHOSCOPY WITH ENDOBRONCHIAL NAVIGATION;  Surgeon: Garner Nash, DO;  Location: [facility];  Service: Thoracic;  Laterality: N/A;  . VIDEO BRONCHOSCOPY WITH ENDOBRONCHIAL ULTRASOUND N/A 04/04/2019   Procedure: VIDEO BRONCHOSCOPY WITH ENDOBRONCHIAL ULTRASOUND;  Surgeon: Garner Nash, DO;  Location: [facility] OR;  Service: Thoracic;  Laterality: N/A;    REVIEW OF SYSTEMS:  Constitutional: positive for fatigue Eyes: negative Ears, nose, mouth, throat, and face: negative Respiratory: negative Cardiovascular: negative Gastrointestinal: positive for nausea Genitourinary:negative Integument/breast: negative Hematologic/lymphatic:  negative Musculoskeletal:negative Neurological: negative Behavioral/Psych: negative Endocrine: negative Allergic/Immunologic: negative   PHYSICAL EXAMINATION: General appearance: alert, cooperative, fatigued and no distress Head: Normocephalic, without obvious abnormality, atraumatic Neck: no adenopathy, no JVD, supple, symmetrical, trachea midline and thyroid not enlarged, symmetric, no tenderness/mass/nodules Lymph nodes: Cervical, supraclavicular, and axillary nodes normal. Resp: clear to auscultation bilaterally Back: symmetric, no curvature. ROM normal. No CVA tenderness. Cardio: regular rate and rhythm, S1, S2 normal, no murmur, click, rub or gallop GI: soft, non-tender; bowel sounds normal; no masses,  no organomegaly Extremities: extremities normal, atraumatic, no cyanosis or edema Neurologic: Alert and oriented X 3, normal strength and tone. Normal symmetric reflexes. Normal coordination and gait  ECOG PERFORMANCE STATUS: 1 - Symptomatic but completely ambulatory  Blood pressure (!) 143/88, pulse 99, temperature 98.6 F (37 C), temperature source Tympanic, resp. rate 20, height 5' 6"  (1.676 m), weight 112 lb (50.8 kg), last menstrual period 01/19/2011, SpO2 100 %.  LABORATORY DATA: Lab Results  Component Value Date   WBC 2.8 (L) 11/15/2019   HGB 10.2 (L) 11/15/2019   HCT 29.4 (L) 11/15/2019   MCV 97.0 11/15/2019   PLT 105 (L) 11/15/2019      Chemistry      Component Value Date/Time   NA 131 (L) 11/15/2019 1041   K 2.7 (LL) 11/15/2019 1041   CL 97 (L) 11/15/2019 1041   CO2 26 11/15/2019 1041   BUN 5 (L) 11/15/2019 1041   CREATININE 0.65 11/15/2019 1041      Component Value Date/Time   CALCIUM 8.7 (L) 11/15/2019 1041   ALKPHOS 155 (H) 11/15/2019 1041   AST 83 (H) 11/15/2019 1041   ALT 42 11/15/2019 1041   BILITOT 0.3 11/15/2019 1041       RADIOGRAPHIC STUDIES: CT Chest W Contrast  Result Date: 11/30/2019 CLINICAL DATA:  Primary Cancer Type: Lung Imaging  Indication: Assess response to therapy Interval therapy since last imaging? Yes Initial Cancer Diagnosis Date: 08/10/2019; Established by: Biopsy-proven Detailed Pathology: Stage IV non-small cell lung cancer, adenocarcinoma. Primary Tumor location: Right upper lobe. Surgeries: No Chemotherapy: Yes; Ongoing? Yes; Most recent administration: 11/15/2019 Immunotherapy?  Yes; Type: Keytruda; Ongoing? Yes Radiation therapy? No EXAM: CT CHEST, ABDOMEN, AND PELVIS WITH CONTRAST TECHNIQUE: Multidetector CT imaging of the chest, abdomen and pelvis was performed following the standard protocol during bolus administration of intravenous contrast. CONTRAST:  168m OMNIPAQUE IOHEXOL 300 MG/ML  SOLN COMPARISON:  09/01/2019 PET-CT.  CT chest 04/03/2019. FINDINGS: CT CHEST FINDINGS Cardiovascular: RIGHT-sided Port-A-Cath terminates in the RIGHT atrium. Aorta is of normal caliber. Heart size normal without pericardial effusion. Central pulmonary vasculature is unremarkable on venous phase assessment. Mediastinum/Nodes: Thoracic inlet structures are normal. No axillary lymphadenopathy. No mediastinal lymphadenopathy. Mild enlargement of RIGHT hilar nodal tissue, not pathologic by size criteria on CT 11 mm (image 28, series 2) mild fullness of nodal tissue in this area before, less enlarged however on previous exam less than a cm. No adenopathy in the mediastinum. Esophagus grossly normal. Lungs/Pleura: RIGHT paraspinal mass diminished in size 2.5 x 1.9 cm (image 31, series 6) along the medial aspect of the RIGHT upper lobe, previously approximately 3.1 x 2.4 cm. Nodule along the minor fissure in the RIGHT upper lobe (image 52, series 6) 1.5 x 1.4  cm within 1-2 mm of measurement on the PET scan of June of 2021. Adjacent nodular changes seen central to this are within 1-2 mm as well. Assessment on PET is mildly limited due to respiratory motion, previous exam was a PET evaluation. Airways are patent. 11 mm sub solid area in the  anterior LEFT upper lobe and another small area in the posterior LEFT upper lobe are stable. Musculoskeletal: Subacute rib fractures of RIGHT posterior ribs with similar appearance. See below for full musculoskeletal detail. CT ABDOMEN PELVIS FINDINGS Hepatobiliary: Liver normal in size and contour without focal, suspicious hepatic lesion. The portal vein is patent. Hepatic veins are patent. Cholelithiasis without pericholecystic stranding. No biliary duct dilation. Pancreas: Pancreas normal without ductal dilation or inflammation. Spleen: Spleen normal in size and contour without focal lesion. Adrenals/Urinary Tract: Adrenal glands are normal. Renal enhancement symmetric on corticomedullary with striated nephrogram on nephrographic phase. No hydronephrosis. Urinary bladder unremarkable. Stomach/Bowel: No acute gastrointestinal process. Is normal appendix. Vascular/Lymphatic: Calcified and noncalcified atheromatous plaque of the abdominal aorta. The There is no gastrohepatic or hepatoduodenal ligament lymphadenopathy. No retroperitoneal or mesenteric lymphadenopathy. No pelvic sidewall lymphadenopathy. Reproductive: No adnexal mass. Other: Umbilical and ventral hernias containing fat without change. No ascites. Musculoskeletal: No acute musculoskeletal process. IMPRESSION: 1. nephrogram bilaterally, could be seen in the setting of pyelonephritis. Correlate with any clinical or laboratory evidence of urinary tract infection 2. Decrease in size of the RIGHT paraspinal mass along the medial aspect of the RIGHT upper lobe when compared to previous PET and CT imaging. 3. Nodules along the minor fissure in the RIGHT upper lobe, dominant nodule approximately 15 mm, within 1-2 mm of its measurement on the recent PET and enlarged from 11 mm when compared to the January 2021 evaluation. 4. LEFT upper lobe nodules with sub solid appearance are unchanged, dominant area on image 44 series 6 at 11 mm. 5. Mild enlargement of RIGHT  hilar nodal tissue, not pathologic by size criteria on CT criteria on CT 11 mm. Attention on follow-up given slight enlargement of RIGHT upper lobe nodule along the fissure as discussed. 6. Subacute rib fractures of RIGHT posterior ribs with similar appearance. See below for full musculoskeletal detail. 7. Cholelithiasis without evidence of acute cholecystitis. 8. Aortic atherosclerosis. These results will be called to the ordering clinician or representative by the Radiologist Assistant, and communication documented in the PACS or [employer]. Aortic Atherosclerosis (ICD10-I70.0). Electronically Signed   By: Zetta Bills M.D.   On: 11/30/2019 10:37   CT Abdomen Pelvis W Contrast  Result Date: 11/30/2019 CLINICAL DATA:  Primary Cancer Type: Lung Imaging Indication: Assess response to therapy Interval therapy since last imaging? Yes Initial Cancer Diagnosis Date: 08/10/2019; Established by: Biopsy-proven Detailed Pathology: Stage IV non-small cell lung cancer, adenocarcinoma. Primary Tumor location: Right upper lobe. Surgeries: No Chemotherapy: Yes; Ongoing? Yes; Most recent administration: 11/15/2019 Immunotherapy?  Yes; Type: Keytruda; Ongoing? Yes Radiation therapy? No EXAM: CT CHEST, ABDOMEN, AND PELVIS WITH CONTRAST TECHNIQUE: Multidetector CT imaging of the chest, abdomen and pelvis was performed following the standard protocol during bolus administration of intravenous contrast. CONTRAST:  142m OMNIPAQUE IOHEXOL 300 MG/ML  SOLN COMPARISON:  09/01/2019 PET-CT.  CT chest 04/03/2019. FINDINGS: CT CHEST FINDINGS Cardiovascular: RIGHT-sided Port-A-Cath terminates in the RIGHT atrium. Aorta is of normal caliber. Heart size normal without pericardial effusion. Central pulmonary vasculature is unremarkable on venous phase assessment. Mediastinum/Nodes: Thoracic inlet structures are normal. No axillary lymphadenopathy. No mediastinal lymphadenopathy. Mild enlargement of RIGHT hilar nodal tissue,  not  pathologic by size criteria on CT 11 mm (image 28, series 2) mild fullness of nodal tissue in this area before, less enlarged however on previous exam less than a cm. No adenopathy in the mediastinum. Esophagus grossly normal. Lungs/Pleura: RIGHT paraspinal mass diminished in size 2.5 x 1.9 cm (image 31, series 6) along the medial aspect of the RIGHT upper lobe, previously approximately 3.1 x 2.4 cm. Nodule along the minor fissure in the RIGHT upper lobe (image 52, series 6) 1.5 x 1.4 cm within 1-2 mm of measurement on the PET scan of June of 2021. Adjacent nodular changes seen central to this are within 1-2 mm as well. Assessment on PET is mildly limited due to respiratory motion, previous exam was a PET evaluation. Airways are patent. 11 mm sub solid area in the anterior LEFT upper lobe and another small area in the posterior LEFT upper lobe are stable. Musculoskeletal: Subacute rib fractures of RIGHT posterior ribs with similar appearance. See below for full musculoskeletal detail. CT ABDOMEN PELVIS FINDINGS Hepatobiliary: Liver normal in size and contour without focal, suspicious hepatic lesion. The portal vein is patent. Hepatic veins are patent. Cholelithiasis without pericholecystic stranding. No biliary duct dilation. Pancreas: Pancreas normal without ductal dilation or inflammation. Spleen: Spleen normal in size and contour without focal lesion. Adrenals/Urinary Tract: Adrenal glands are normal. Renal enhancement symmetric on corticomedullary with striated nephrogram on nephrographic phase. No hydronephrosis. Urinary bladder unremarkable. Stomach/Bowel: No acute gastrointestinal process. Is normal appendix. Vascular/Lymphatic: Calcified and noncalcified atheromatous plaque of the abdominal aorta. The There is no gastrohepatic or hepatoduodenal ligament lymphadenopathy. No retroperitoneal or mesenteric lymphadenopathy. No pelvic sidewall lymphadenopathy. Reproductive: No adnexal mass. Other: Umbilical and  ventral hernias containing fat without change. No ascites. Musculoskeletal: No acute musculoskeletal process. IMPRESSION: 1. nephrogram bilaterally, could be seen in the setting of pyelonephritis. Correlate with any clinical or laboratory evidence of urinary tract infection 2. Decrease in size of the RIGHT paraspinal mass along the medial aspect of the RIGHT upper lobe when compared to previous PET and CT imaging. 3. Nodules along the minor fissure in the RIGHT upper lobe, dominant nodule approximately 15 mm, within 1-2 mm of its measurement on the recent PET and enlarged from 11 mm when compared to the January 2021 evaluation. 4. LEFT upper lobe nodules with sub solid appearance are unchanged, dominant area on image 44 series 6 at 11 mm. 5. Mild enlargement of RIGHT hilar nodal tissue, not pathologic by size criteria on CT criteria on CT 11 mm. Attention on follow-up given slight enlargement of RIGHT upper lobe nodule along the fissure as discussed. 6. Subacute rib fractures of RIGHT posterior ribs with similar appearance. See below for full musculoskeletal detail. 7. Cholelithiasis without evidence of acute cholecystitis. 8. Aortic atherosclerosis. These results will be called to the ordering clinician or representative by the Radiologist Assistant, and communication documented in the PACS or [employer]. Aortic Atherosclerosis (ICD10-I70.0). Electronically Signed   By: Zetta Bills M.D.   On: 11/30/2019 10:37    ASSESSMENT AND PLAN: This is a very pleasant 59 years old African-American female with a stage IV non-small cell lung cancer, adenocarcinoma presented with large right upper lobe lung mass in addition to suspicious right paratracheal lymphadenopathy and bilateral pulmonary nodules as well as retroperitoneal lymphadenopathy diagnosed in May 2021 with no actionable mutations and negative PD-L1 expression. The patient is currently undergoing systemic chemotherapy with carboplatin for AUC of 5,  Alimta 500 mg/M2 and Keytruda 200 mg IV every  3 weeks status post 3 cycles.  The patient has been tolerating her treatment with systemic chemotherapy fairly well except for mild nausea. She had repeat CT scan of the chest, abdomen pelvis performed recently.  I personally and independently reviewed the scans and discussed the results with the patient today. Her scan showed no concerning findings for disease progression. I recommended for the patient to continue her current treatment with systemic chemotherapy and she will start cycle #4 tomorrow. She will come back for follow-up visit in 3 weeks for evaluation before starting cycle #5. For the hypertension the patient was advised to monitor her blood pressure closely at home. She was advised to call immediately if she has any other concerning symptoms in the interval. The patient voices understanding of current disease status and treatment options and is in agreement with the current care plan.  All questions were answered. The patient knows to call the clinic with any problems, questions or concerns. We can certainly see the patient much sooner if necessary.  The total time spent in the appointment was 30 minutes.  Disclaimer: This note was dictated with voice recognition software. Similar sounding words can inadvertently be transcribed and may not be corrected upon review.

## 2019-12-05 NOTE — Telephone Encounter (Signed)
cial services disability papers-Pt asking for MD to complete her disabilty forms. [name] does not do these exams and pt notified.. She does not have a PCP.

## 2019-12-05 NOTE — Patient Instructions (Signed)
[facility] Discharge Instructions for Patients Receiving Chemotherapy  Today you received the following chemotherapy agents Pembrolizumab (KEYTRUDA), Pemetrexed (ALIMTA) & Carboplatin (PARAPLATIN).  To help prevent nausea and vomiting after your treatment, we encourage you to take your nausea medication as prescribed.   If you develop nausea and vomiting that is not controlled by your nausea medication, call the clinic.   BELOW ARE SYMPTOMS THAT SHOULD BE REPORTED IMMEDIATELY:  *FEVER GREATER THAN 100.5 F  *CHILLS WITH OR WITHOUT FEVER  NAUSEA AND VOMITING THAT IS NOT CONTROLLED WITH YOUR NAUSEA MEDICATION  *UNUSUAL SHORTNESS OF BREATH  *UNUSUAL BRUISING OR BLEEDING  TENDERNESS IN MOUTH AND THROAT WITH OR WITHOUT PRESENCE OF ULCERS  *URINARY PROBLEMS  *BOWEL PROBLEMS  UNUSUAL RASH Items with * indicate a potential emergency and should be followed up as soon as possible.  Feel free to call the clinic should you have any questions or concerns. The clinic phone number is [phone].  Please show the [place] at check-in to the Emergency Department and triage nurse.

## 2019-12-06 ENCOUNTER — Encounter: Payer: Self-pay | Admitting: General Practice

## 2019-12-06 ENCOUNTER — Ambulatory Visit: Payer: Self-pay

## 2019-12-06 NOTE — Progress Notes (Signed)
Brenda [name] Progress Notes  Call to patient - per patient and per chart review, patient is working w [employer] to appeal a previous denial for [employer] benefits.  A records request is in the chart.  Patient has also been referred to [facility] to apply for disability benefits - they have filed her case recently and will provide an update on her current status.  If patient needs a disability exam, she can ask for [employer] to arrange this for her w an appropriate medical disability examiner.  Patient will bring paperwork w her to Fridays appointment and will give to R Ramiro Harvest, RN who handles employer disability issues for [facility] to review and determine if this is covered under her purview.  If not, patient will need to continue to work w [employer] for her appeal and/or with [facility] for her current disability application.  Patient advised of this.    Brenda Shell, LCSW Clinical Social Worker Phone:  [phone]

## 2019-12-06 NOTE — Progress Notes (Signed)
[facility] CSW Progress Notes  Per [employer], "Ms. Delgado at the [place] office who said the clients case has been sent to the hearing office in [facility] as of 10/09/19."  Edwyna Shell, [place] Worker Phone:  [phone]

## 2019-12-08 ENCOUNTER — Inpatient Hospital Stay: Payer: Medicaid Other

## 2019-12-08 ENCOUNTER — Other Ambulatory Visit: Payer: Self-pay

## 2019-12-08 VITALS — BP 125/75 | HR 88 | Temp 98.7°F | Resp 18

## 2019-12-08 DIAGNOSIS — Z5112 Encounter for antineoplastic immunotherapy: Secondary | ICD-10-CM | POA: Diagnosis not present

## 2019-12-08 DIAGNOSIS — C3491 Malignant neoplasm of unspecified part of right bronchus or lung: Secondary | ICD-10-CM

## 2019-12-08 MED ORDER — PEGFILGRASTIM-CBQV 6 MG/0.6ML ~~LOC~~ SOSY
PREFILLED_SYRINGE | SUBCUTANEOUS | Status: AC
  Filled 2019-12-08: qty 0.6

## 2019-12-08 MED ORDER — PEGFILGRASTIM-CBQV 6 MG/0.6ML ~~LOC~~ SOSY
6.0000 mg | PREFILLED_SYRINGE | Freq: Once | SUBCUTANEOUS | Status: AC
  Administered 2019-12-08: 6 mg via SUBCUTANEOUS

## 2019-12-08 NOTE — Patient Instructions (Signed)

## 2019-12-12 ENCOUNTER — Other Ambulatory Visit: Payer: Self-pay

## 2019-12-12 ENCOUNTER — Inpatient Hospital Stay: Payer: Medicaid Other

## 2019-12-12 DIAGNOSIS — Z5112 Encounter for antineoplastic immunotherapy: Secondary | ICD-10-CM | POA: Diagnosis not present

## 2019-12-12 DIAGNOSIS — Z95828 Presence of other vascular implants and grafts: Secondary | ICD-10-CM

## 2019-12-12 DIAGNOSIS — C3491 Malignant neoplasm of unspecified part of right bronchus or lung: Secondary | ICD-10-CM

## 2019-12-12 LAB — CBC WITH DIFFERENTIAL (CANCER CENTER ONLY)
Abs Immature Granulocytes: 0.07 10*3/uL (ref 0.00–0.07)
Basophils Absolute: 0 10*3/uL (ref 0.0–0.1)
Basophils Relative: 0 %
Eosinophils Absolute: 0 10*3/uL (ref 0.0–0.5)
Eosinophils Relative: 0 %
HCT: 23.7 % — ABNORMAL LOW (ref 36.0–46.0)
Hemoglobin: 8 g/dL — ABNORMAL LOW (ref 12.0–15.0)
Immature Granulocytes: 1 %
Lymphocytes Relative: 16 %
Lymphs Abs: 1.5 10*3/uL (ref 0.7–4.0)
MCH: 34.9 pg — ABNORMAL HIGH (ref 26.0–34.0)
MCHC: 33.8 g/dL (ref 30.0–36.0)
MCV: 103.5 fL — ABNORMAL HIGH (ref 80.0–100.0)
Monocytes Absolute: 1 10*3/uL (ref 0.1–1.0)
Monocytes Relative: 11 %
Neutro Abs: 6.8 10*3/uL (ref 1.7–7.7)
Neutrophils Relative %: 72 %
Platelet Count: 97 10*3/uL — ABNORMAL LOW (ref 150–400)
RBC: 2.29 MIL/uL — ABNORMAL LOW (ref 3.87–5.11)
RDW: 17.5 % — ABNORMAL HIGH (ref 11.5–15.5)
WBC Count: 9.5 10*3/uL (ref 4.0–10.5)
nRBC: 0 % (ref 0.0–0.2)

## 2019-12-12 LAB — CMP (CANCER CENTER ONLY)
ALT: 20 U/L (ref 0–44)
AST: 25 U/L (ref 15–41)
Albumin: 3 g/dL — ABNORMAL LOW (ref 3.5–5.0)
Alkaline Phosphatase: 177 U/L — ABNORMAL HIGH (ref 38–126)
Anion gap: 8 (ref 5–15)
BUN: 4 mg/dL — ABNORMAL LOW (ref 6–20)
CO2: 24 mmol/L (ref 22–32)
Calcium: 8.6 mg/dL — ABNORMAL LOW (ref 8.9–10.3)
Chloride: 103 mmol/L (ref 98–111)
Creatinine: 0.62 mg/dL (ref 0.44–1.00)
GFR, Est AFR Am: >60 mL/min (ref >60)
GFR, Estimated: >60 mL/min (ref >60)
Glucose, Bld: 87 mg/dL (ref 70–99)
Potassium: 3.4 mmol/L — ABNORMAL LOW (ref 3.5–5.1)
Sodium: 135 mmol/L (ref 135–145)
Total Bilirubin: 0.7 mg/dL (ref 0.3–1.2)
Total Protein: 6.9 g/dL (ref 6.5–8.1)

## 2019-12-12 MED ORDER — SODIUM CHLORIDE 0.9% FLUSH
10.0000 mL | INTRAVENOUS | Status: DC | PRN
  Administered 2019-12-12: 10 mL via INTRAVENOUS
  Filled 2019-12-12: qty 10

## 2019-12-12 MED ORDER — HEPARIN SOD (PORK) LOCK FLUSH 100 UNIT/ML IV SOLN
500.0000 [IU] | Freq: Once | INTRAVENOUS | Status: AC
  Administered 2019-12-12: 500 [IU] via INTRAVENOUS
  Filled 2019-12-12: qty 5

## 2019-12-12 NOTE — Patient Instructions (Signed)

## 2019-12-18 ENCOUNTER — Telehealth: Payer: Self-pay | Admitting: *Deleted

## 2019-12-18 NOTE — Telephone Encounter (Signed)
No new note

## 2019-12-18 NOTE — Telephone Encounter (Signed)
[employer] Appeal information received for this patient in queue at this time.  Unable to complete form per oncology [employer] information.  Connected with Dr. Julien Nordmann about denial of Social Security benefits for this patient appeal request.  Verbal order received and read back from Dr. Julien Nordmann that this is not a request for Dr. Julien Nordmann,  Dr. Julien Nordmann can say she has cancer only.  Unable to sign the appeal request.  I am not a disability provider."    Notifying Social Worker working with patient with this request.  This nurse has not reached or connected with patient.

## 2019-12-19 ENCOUNTER — Other Ambulatory Visit: Payer: Self-pay | Admitting: *Deleted

## 2019-12-19 ENCOUNTER — Inpatient Hospital Stay: Payer: Medicaid Other

## 2019-12-19 ENCOUNTER — Other Ambulatory Visit: Payer: Self-pay | Admitting: Internal Medicine

## 2019-12-19 ENCOUNTER — Other Ambulatory Visit: Payer: Self-pay

## 2019-12-19 DIAGNOSIS — Z95828 Presence of other vascular implants and grafts: Secondary | ICD-10-CM

## 2019-12-19 DIAGNOSIS — Z5112 Encounter for antineoplastic immunotherapy: Secondary | ICD-10-CM | POA: Diagnosis not present

## 2019-12-19 DIAGNOSIS — C3491 Malignant neoplasm of unspecified part of right bronchus or lung: Secondary | ICD-10-CM

## 2019-12-19 LAB — CMP (CANCER CENTER ONLY)
ALT: 23 U/L (ref 0–44)
AST: 28 U/L (ref 15–41)
Albumin: 3.1 g/dL — ABNORMAL LOW (ref 3.5–5.0)
Alkaline Phosphatase: 209 U/L — ABNORMAL HIGH (ref 38–126)
Anion gap: 11 (ref 5–15)
BUN: <4 mg/dL — ABNORMAL LOW (ref 6–20)
CO2: 25 mmol/L (ref 22–32)
Calcium: 8.8 mg/dL — ABNORMAL LOW (ref 8.9–10.3)
Chloride: 101 mmol/L (ref 98–111)
Creatinine: 0.69 mg/dL (ref 0.44–1.00)
GFR, Est AFR Am: >60 mL/min (ref >60)
GFR, Estimated: >60 mL/min (ref >60)
Glucose, Bld: 79 mg/dL (ref 70–99)
Potassium: 3 mmol/L — CL (ref 3.5–5.1)
Sodium: 137 mmol/L (ref 135–145)
Total Bilirubin: <0.2 mg/dL — ABNORMAL LOW (ref 0.3–1.2)
Total Protein: 7.4 g/dL (ref 6.5–8.1)

## 2019-12-19 LAB — CBC WITH DIFFERENTIAL (CANCER CENTER ONLY)
Abs Immature Granulocytes: 0.24 10*3/uL — ABNORMAL HIGH (ref 0.00–0.07)
Basophils Absolute: 0.1 10*3/uL (ref 0.0–0.1)
Basophils Relative: 0 %
Eosinophils Absolute: 0 10*3/uL (ref 0.0–0.5)
Eosinophils Relative: 0 %
HCT: 23.8 % — ABNORMAL LOW (ref 36.0–46.0)
Hemoglobin: 8.2 g/dL — ABNORMAL LOW (ref 12.0–15.0)
Immature Granulocytes: 2 %
Lymphocytes Relative: 15 %
Lymphs Abs: 1.8 10*3/uL (ref 0.7–4.0)
MCH: 35.3 pg — ABNORMAL HIGH (ref 26.0–34.0)
MCHC: 34.5 g/dL (ref 30.0–36.0)
MCV: 102.6 fL — ABNORMAL HIGH (ref 80.0–100.0)
Monocytes Absolute: 1 10*3/uL (ref 0.1–1.0)
Monocytes Relative: 8 %
Neutro Abs: 9.5 10*3/uL — ABNORMAL HIGH (ref 1.7–7.7)
Neutrophils Relative %: 75 %
Platelet Count: 89 10*3/uL — ABNORMAL LOW (ref 150–400)
RBC: 2.32 MIL/uL — ABNORMAL LOW (ref 3.87–5.11)
RDW: 17.9 % — ABNORMAL HIGH (ref 11.5–15.5)
WBC Count: 12.5 10*3/uL — ABNORMAL HIGH (ref 4.0–10.5)
nRBC: 0 % (ref 0.0–0.2)

## 2019-12-19 MED ORDER — POTASSIUM CHLORIDE CRYS ER 20 MEQ PO TBCR: 20.0000 meq | EXTENDED_RELEASE_TABLET | Freq: Every day | ORAL | 0 refills | Status: DC

## 2019-12-19 MED ORDER — SODIUM CHLORIDE 0.9% FLUSH
10.0000 mL | INTRAVENOUS | Status: DC | PRN
  Administered 2019-12-19: 10 mL via INTRAVENOUS
  Filled 2019-12-19: qty 10

## 2019-12-19 MED ORDER — HEPARIN SOD (PORK) LOCK FLUSH 100 UNIT/ML IV SOLN
500.0000 [IU] | Freq: Once | INTRAVENOUS | Status: DC
  Filled 2019-12-19: qty 5

## 2019-12-19 MED FILL — POTASSIUM CHLORIDE CRYS ER: 20 | 10 days supply | Qty: 10 | Fill #0

## 2019-12-19 NOTE — Patient Instructions (Signed)

## 2019-12-21 ENCOUNTER — Telehealth: Payer: Self-pay | Admitting: *Deleted

## 2019-12-21 NOTE — Telephone Encounter (Signed)
Connected with patient in reference to forms left for 12/19/2019 pick-up with note indicating [facility] provider unable to appeal denial of disability.     Provided above information and to use whatever resources discussed with Education officer, museum.   "I will pick up form on 10-08/2019."  Currently no questions or needs.

## 2019-12-27 ENCOUNTER — Inpatient Hospital Stay: Payer: Medicaid Other

## 2019-12-27 ENCOUNTER — Other Ambulatory Visit: Payer: Self-pay

## 2019-12-27 ENCOUNTER — Inpatient Hospital Stay (HOSPITAL_BASED_OUTPATIENT_CLINIC_OR_DEPARTMENT_OTHER): Payer: Medicaid Other | Admitting: Medical

## 2019-12-27 ENCOUNTER — Inpatient Hospital Stay (HOSPITAL_BASED_OUTPATIENT_CLINIC_OR_DEPARTMENT_OTHER): Payer: Medicaid Other | Admitting: Internal Medicine

## 2019-12-27 ENCOUNTER — Other Ambulatory Visit: Payer: Self-pay | Admitting: Internal Medicine

## 2019-12-27 ENCOUNTER — Encounter: Payer: Self-pay | Admitting: Internal Medicine

## 2019-12-27 ENCOUNTER — Inpatient Hospital Stay: Payer: Medicaid Other | Attending: Internal Medicine

## 2019-12-27 VITALS — BP 126/77 | HR 89 | Temp 97.9°F | Resp 20 | Ht 66.0 in | Wt 112.3 lb

## 2019-12-27 DIAGNOSIS — Z5112 Encounter for antineoplastic immunotherapy: Secondary | ICD-10-CM

## 2019-12-27 DIAGNOSIS — R591 Generalized enlarged lymph nodes: Secondary | ICD-10-CM | POA: Insufficient documentation

## 2019-12-27 DIAGNOSIS — N390 Urinary tract infection, site not specified: Secondary | ICD-10-CM | POA: Insufficient documentation

## 2019-12-27 DIAGNOSIS — Z79899 Other long term (current) drug therapy: Secondary | ICD-10-CM | POA: Insufficient documentation

## 2019-12-27 DIAGNOSIS — C3491 Malignant neoplasm of unspecified part of right bronchus or lung: Secondary | ICD-10-CM

## 2019-12-27 DIAGNOSIS — K8020 Calculus of gallbladder without cholecystitis without obstruction: Secondary | ICD-10-CM | POA: Insufficient documentation

## 2019-12-27 DIAGNOSIS — R0609 Other forms of dyspnea: Secondary | ICD-10-CM | POA: Diagnosis not present

## 2019-12-27 DIAGNOSIS — Z5111 Encounter for antineoplastic chemotherapy: Secondary | ICD-10-CM

## 2019-12-27 DIAGNOSIS — R0602 Shortness of breath: Secondary | ICD-10-CM | POA: Diagnosis not present

## 2019-12-27 DIAGNOSIS — C3411 Malignant neoplasm of upper lobe, right bronchus or lung: Secondary | ICD-10-CM | POA: Insufficient documentation

## 2019-12-27 DIAGNOSIS — R630 Anorexia: Secondary | ICD-10-CM | POA: Insufficient documentation

## 2019-12-27 DIAGNOSIS — I700 Atherosclerosis of aorta: Secondary | ICD-10-CM | POA: Insufficient documentation

## 2019-12-27 DIAGNOSIS — R911 Solitary pulmonary nodule: Secondary | ICD-10-CM | POA: Insufficient documentation

## 2019-12-27 DIAGNOSIS — Z886 Allergy status to analgesic agent status: Secondary | ICD-10-CM | POA: Diagnosis not present

## 2019-12-27 LAB — CMP (CANCER CENTER ONLY)
ALT: 26 U/L (ref 0–44)
AST: 39 U/L (ref 15–41)
Albumin: 3.1 g/dL — ABNORMAL LOW (ref 3.5–5.0)
Alkaline Phosphatase: 173 U/L — ABNORMAL HIGH (ref 38–126)
Anion gap: 7 (ref 5–15)
BUN: <4 mg/dL — ABNORMAL LOW (ref 6–20)
CO2: 25 mmol/L (ref 22–32)
Calcium: 8.8 mg/dL — ABNORMAL LOW (ref 8.9–10.3)
Chloride: 107 mmol/L (ref 98–111)
Creatinine: 0.72 mg/dL (ref 0.44–1.00)
GFR, Estimated: >60 mL/min (ref >60)
Glucose, Bld: 96 mg/dL (ref 70–99)
Potassium: 3.5 mmol/L (ref 3.5–5.1)
Sodium: 139 mmol/L (ref 135–145)
Total Bilirubin: 0.3 mg/dL (ref 0.3–1.2)
Total Protein: 7 g/dL (ref 6.5–8.1)

## 2019-12-27 LAB — CBC WITH DIFFERENTIAL (CANCER CENTER ONLY)
Abs Immature Granulocytes: 0.04 10*3/uL (ref 0.00–0.07)
Basophils Absolute: 0 10*3/uL (ref 0.0–0.1)
Basophils Relative: 0 %
Eosinophils Absolute: 0 10*3/uL (ref 0.0–0.5)
Eosinophils Relative: 0 %
HCT: 26 % — ABNORMAL LOW (ref 36.0–46.0)
Hemoglobin: 8.6 g/dL — ABNORMAL LOW (ref 12.0–15.0)
Immature Granulocytes: 1 %
Lymphocytes Relative: 24 %
Lymphs Abs: 1.6 10*3/uL (ref 0.7–4.0)
MCH: 35.2 pg — ABNORMAL HIGH (ref 26.0–34.0)
MCHC: 33.1 g/dL (ref 30.0–36.0)
MCV: 106.6 fL — ABNORMAL HIGH (ref 80.0–100.0)
Monocytes Absolute: 1.2 10*3/uL — ABNORMAL HIGH (ref 0.1–1.0)
Monocytes Relative: 17 %
Neutro Abs: 4 10*3/uL (ref 1.7–7.7)
Neutrophils Relative %: 58 %
Platelet Count: 291 10*3/uL (ref 150–400)
RBC: 2.44 MIL/uL — ABNORMAL LOW (ref 3.87–5.11)
RDW: 20.1 % — ABNORMAL HIGH (ref 11.5–15.5)
WBC Count: 6.9 10*3/uL (ref 4.0–10.5)
nRBC: 0 % (ref 0.0–0.2)

## 2019-12-27 LAB — TSH: TSH: 1.288 u[IU]/mL (ref 0.308–3.960)

## 2019-12-27 MED ORDER — HEPARIN SOD (PORK) LOCK FLUSH 100 UNIT/ML IV SOLN
500.0000 [IU] | Freq: Once | INTRAVENOUS | Status: AC | PRN
  Administered 2019-12-27: 500 [IU]
  Filled 2019-12-27: qty 5

## 2019-12-27 MED ORDER — PROCHLORPERAZINE MALEATE 10 MG PO TABS
ORAL_TABLET | ORAL | Status: AC
  Filled 2019-12-27: qty 1

## 2019-12-27 MED ORDER — PROCHLORPERAZINE MALEATE 10 MG PO TABS
10.0000 mg | ORAL_TABLET | Freq: Once | ORAL | Status: AC
  Administered 2019-12-27: 10 mg via ORAL

## 2019-12-27 MED ORDER — SODIUM CHLORIDE 0.9% FLUSH
10.0000 mL | INTRAVENOUS | Status: DC | PRN
  Administered 2019-12-27: 10 mL
  Filled 2019-12-27: qty 10

## 2019-12-27 MED ORDER — SODIUM CHLORIDE 0.9 % IV SOLN
200.0000 mg | Freq: Once | INTRAVENOUS | Status: AC
  Administered 2019-12-27: 200 mg via INTRAVENOUS
  Filled 2019-12-27: qty 8

## 2019-12-27 MED ORDER — SODIUM CHLORIDE 0.9% FLUSH
3.0000 mL | INTRAVENOUS | Status: DC | PRN
  Administered 2019-12-27: 3 mL
  Filled 2019-12-27: qty 10

## 2019-12-27 MED ORDER — SODIUM CHLORIDE 0.9 % IV SOLN
Freq: Once | INTRAVENOUS | Status: AC
  Filled 2019-12-27: qty 250

## 2019-12-27 MED ORDER — SODIUM CHLORIDE 0.9 % IV SOLN
500.0000 mg/m2 | Freq: Once | INTRAVENOUS | Status: AC
  Administered 2019-12-27: 800 mg via INTRAVENOUS
  Filled 2019-12-27: qty 20

## 2019-12-27 NOTE — Progress Notes (Signed)
[place] [phone]   [phone]  OFFICE PROGRESS NOTE  Pcp, No No address on file  DIAGNOSIS: Stage IV (T2 a, N2, M1 B) non-small cell lung cancer, adenocarcinoma presented with large right upper lobe lung mass in addition to suspicious right paratracheal lymphadenopathy and bilateral pulmonary nodules as well as retroperitoneal lymphadenopathy diagnosed in May 2021. The patient has no actionable mutations and has negative PD-L1 expression on molecular studies by foundation 1.  PRIOR THERAPY: None  CURRENT THERAPY: Systemic chemotherapy with carboplatin for an AUC of 5, Alimta 500 mg/m2, and Keytruda 200 mg IV every 3 weeks. First dose 09/28/2019. Status post 4 cycles.  INTERVAL HISTORY: Brenda [name] 59 y.o. female returns to the clinic today for follow-up visit.  The patient is feeling fine today with no concerning complaints except for occasional nausea and lack of appetite.  She denied having any chest pain, shortness of breath, cough or hemoptysis.  She denied having any fever or chills.  She has no vomiting, diarrhea or constipation.  She has no headache or visual changes.  She denied having any significant weight loss or night sweats.  She is here today for evaluation before starting cycle #5 of her treatment.  MEDICAL HISTORY: Past Medical History:  Diagnosis Date  . Breast cancer ([place])   . Depression   . Mental disorder     ALLERGIES:  is allergic to aspirin adult low [aspirin].  MEDICATIONS:  Current Outpatient Medications  Medication Sig Dispense Refill  . calcium carbonate (OSCAL) 1500 (600 Ca) MG TABS tablet Take 600 mg by mouth daily.    . folic acid (FOLVITE) 1 MG tablet Take 1 tablet (1 mg total) by mouth daily. 30 tablet 4  . lidocaine-prilocaine (EMLA) cream Apply 1 application topically as needed. 30 g 2  . potassium chloride SA (KLOR-CON) 20 MEQ tablet Take 1 tablet (20 mEq total) by mouth daily. 10 tablet 0  .  prochlorperazine (COMPAZINE) 10 MG tablet Take 1 tablet (10 mg total) by mouth every 6 (six) hours as needed for nausea or vomiting. 30 tablet 3   No current facility-administered medications for this visit.    SURGICAL HISTORY:  Past Surgical History:  Procedure Laterality Date  . IR IMAGING GUIDED PORT INSERTION  11/03/2019  . TUBAL LIGATION    . VIDEO BRONCHOSCOPY WITH ENDOBRONCHIAL NAVIGATION N/A 04/04/2019   Procedure: VIDEO BRONCHOSCOPY WITH ENDOBRONCHIAL NAVIGATION;  Surgeon: Garner Nash, DO;  Location: [facility];  Service: Thoracic;  Laterality: N/A;  . VIDEO BRONCHOSCOPY WITH ENDOBRONCHIAL ULTRASOUND N/A 04/04/2019   Procedure: VIDEO BRONCHOSCOPY WITH ENDOBRONCHIAL ULTRASOUND;  Surgeon: Garner Nash, DO;  Location: [facility];  Service: Thoracic;  Laterality: N/A;    REVIEW OF SYSTEMS:  A comprehensive review of systems was negative except for: Gastrointestinal: positive for nausea   PHYSICAL EXAMINATION: General appearance: alert, cooperative, fatigued and no distress Head: Normocephalic, without obvious abnormality, atraumatic Neck: no adenopathy, no JVD, supple, symmetrical, trachea midline and thyroid not enlarged, symmetric, no tenderness/mass/nodules Lymph nodes: Cervical, supraclavicular, and axillary nodes normal. Resp: clear to auscultation bilaterally Back: symmetric, no curvature. ROM normal. No CVA tenderness. Cardio: regular rate and rhythm, S1, S2 normal, no murmur, click, rub or gallop GI: soft, non-tender; bowel sounds normal; no masses,  no organomegaly Extremities: extremities normal, atraumatic, no cyanosis or edema  ECOG PERFORMANCE STATUS: 1 - Symptomatic but completely ambulatory  Blood pressure 126/77, pulse 89, temperature 97.9 F (36.6 C), temperature source  Tympanic, resp. rate 20, height 5' 6"  (1.676 m), weight 112 lb 4.8 oz (50.9 kg), last menstrual period 01/19/2011, SpO2 100 %.  LABORATORY DATA: Lab Results  Component Value Date   WBC 6.9  12/27/2019   HGB 8.6 (L) 12/27/2019   HCT 26.0 (L) 12/27/2019   MCV 106.6 (H) 12/27/2019   PLT 291 12/27/2019      Chemistry      Component Value Date/Time   NA 137 12/19/2019 1145   K 3.0 (LL) 12/19/2019 1145   CL 101 12/19/2019 1145   CO2 25 12/19/2019 1145   BUN <4 (L) 12/19/2019 1145   CREATININE 0.69 12/19/2019 1145      Component Value Date/Time   CALCIUM 8.8 (L) 12/19/2019 1145   ALKPHOS 209 (H) 12/19/2019 1145   AST 28 12/19/2019 1145   ALT 23 12/19/2019 1145   BILITOT <0.2 (L) 12/19/2019 1145       RADIOGRAPHIC STUDIES: CT Chest W Contrast  Result Date: 11/30/2019 CLINICAL DATA:  Primary Cancer Type: Lung Imaging Indication: Assess response to therapy Interval therapy since last imaging? Yes Initial Cancer Diagnosis Date: 08/10/2019; Established by: Biopsy-proven Detailed Pathology: Stage IV non-small cell lung cancer, adenocarcinoma. Primary Tumor location: Right upper lobe. Surgeries: No Chemotherapy: Yes; Ongoing? Yes; Most recent administration: 11/15/2019 Immunotherapy?  Yes; Type: Keytruda; Ongoing? Yes Radiation therapy? No EXAM: CT CHEST, ABDOMEN, AND PELVIS WITH CONTRAST TECHNIQUE: Multidetector CT imaging of the chest, abdomen and pelvis was performed following the standard protocol during bolus administration of intravenous contrast. CONTRAST:  150m OMNIPAQUE IOHEXOL 300 MG/ML  SOLN COMPARISON:  09/01/2019 PET-CT.  CT chest 04/03/2019. FINDINGS: CT CHEST FINDINGS Cardiovascular: RIGHT-sided Port-A-Cath terminates in the RIGHT atrium. Aorta is of normal caliber. Heart size normal without pericardial effusion. Central pulmonary vasculature is unremarkable on venous phase assessment. Mediastinum/Nodes: Thoracic inlet structures are normal. No axillary lymphadenopathy. No mediastinal lymphadenopathy. Mild enlargement of RIGHT hilar nodal tissue, not pathologic by size criteria on CT 11 mm (image 28, series 2) mild fullness of nodal tissue in this area before, less  enlarged however on previous exam less than a cm. No adenopathy in the mediastinum. Esophagus grossly normal. Lungs/Pleura: RIGHT paraspinal mass diminished in size 2.5 x 1.9 cm (image 31, series 6) along the medial aspect of the RIGHT upper lobe, previously approximately 3.1 x 2.4 cm. Nodule along the minor fissure in the RIGHT upper lobe (image 52, series 6) 1.5 x 1.4 cm within 1-2 mm of measurement on the PET scan of June of 2021. Adjacent nodular changes seen central to this are within 1-2 mm as well. Assessment on PET is mildly limited due to respiratory motion, previous exam was a PET evaluation. Airways are patent. 11 mm sub solid area in the anterior LEFT upper lobe and another small area in the posterior LEFT upper lobe are stable. Musculoskeletal: Subacute rib fractures of RIGHT posterior ribs with similar appearance. See below for full musculoskeletal detail. CT ABDOMEN PELVIS FINDINGS Hepatobiliary: Liver normal in size and contour without focal, suspicious hepatic lesion. The portal vein is patent. Hepatic veins are patent. Cholelithiasis without pericholecystic stranding. No biliary duct dilation. Pancreas: Pancreas normal without ductal dilation or inflammation. Spleen: Spleen normal in size and contour without focal lesion. Adrenals/Urinary Tract: Adrenal glands are normal. Renal enhancement symmetric on corticomedullary with striated nephrogram on nephrographic phase. No hydronephrosis. Urinary bladder unremarkable. Stomach/Bowel: No acute gastrointestinal process. Is normal appendix. Vascular/Lymphatic: Calcified and noncalcified atheromatous plaque of the abdominal aorta. The There is no gastrohepatic  or hepatoduodenal ligament lymphadenopathy. No retroperitoneal or mesenteric lymphadenopathy. No pelvic sidewall lymphadenopathy. Reproductive: No adnexal mass. Other: Umbilical and ventral hernias containing fat without change. No ascites. Musculoskeletal: No acute musculoskeletal process.  IMPRESSION: 1. nephrogram bilaterally, could be seen in the setting of pyelonephritis. Correlate with any clinical or laboratory evidence of urinary tract infection 2. Decrease in size of the RIGHT paraspinal mass along the medial aspect of the RIGHT upper lobe when compared to previous PET and CT imaging. 3. Nodules along the minor fissure in the RIGHT upper lobe, dominant nodule approximately 15 mm, within 1-2 mm of its measurement on the recent PET and enlarged from 11 mm when compared to the January 2021 evaluation. 4. LEFT upper lobe nodules with sub solid appearance are unchanged, dominant area on image 44 series 6 at 11 mm. 5. Mild enlargement of RIGHT hilar nodal tissue, not pathologic by size criteria on CT criteria on CT 11 mm. Attention on follow-up given slight enlargement of RIGHT upper lobe nodule along the fissure as discussed. 6. Subacute rib fractures of RIGHT posterior ribs with similar appearance. See below for full musculoskeletal detail. 7. Cholelithiasis without evidence of acute cholecystitis. 8. Aortic atherosclerosis. These results will be called to the ordering clinician or representative by the Radiologist Assistant, and communication documented in the PACS or [employer]. Aortic Atherosclerosis (ICD10-I70.0). Electronically Signed   By: Zetta Bills M.D.   On: 11/30/2019 10:37   CT Abdomen Pelvis W Contrast  Result Date: 11/30/2019 CLINICAL DATA:  Primary Cancer Type: Lung Imaging Indication: Assess response to therapy Interval therapy since last imaging? Yes Initial Cancer Diagnosis Date: 08/10/2019; Established by: Biopsy-proven Detailed Pathology: Stage IV non-small cell lung cancer, adenocarcinoma. Primary Tumor location: Right upper lobe. Surgeries: No Chemotherapy: Yes; Ongoing? Yes; Most recent administration: 11/15/2019 Immunotherapy?  Yes; Type: Keytruda; Ongoing? Yes Radiation therapy? No EXAM: CT CHEST, ABDOMEN, AND PELVIS WITH CONTRAST TECHNIQUE: Multidetector CT  imaging of the chest, abdomen and pelvis was performed following the standard protocol during bolus administration of intravenous contrast. CONTRAST:  18m OMNIPAQUE IOHEXOL 300 MG/ML  SOLN COMPARISON:  09/01/2019 PET-CT.  CT chest 04/03/2019. FINDINGS: CT CHEST FINDINGS Cardiovascular: RIGHT-sided Port-A-Cath terminates in the RIGHT atrium. Aorta is of normal caliber. Heart size normal without pericardial effusion. Central pulmonary vasculature is unremarkable on venous phase assessment. Mediastinum/Nodes: Thoracic inlet structures are normal. No axillary lymphadenopathy. No mediastinal lymphadenopathy. Mild enlargement of RIGHT hilar nodal tissue, not pathologic by size criteria on CT 11 mm (image 28, series 2) mild fullness of nodal tissue in this area before, less enlarged however on previous exam less than a cm. No adenopathy in the mediastinum. Esophagus grossly normal. Lungs/Pleura: RIGHT paraspinal mass diminished in size 2.5 x 1.9 cm (image 31, series 6) along the medial aspect of the RIGHT upper lobe, previously approximately 3.1 x 2.4 cm. Nodule along the minor fissure in the RIGHT upper lobe (image 52, series 6) 1.5 x 1.4 cm within 1-2 mm of measurement on the PET scan of June of 2021. Adjacent nodular changes seen central to this are within 1-2 mm as well. Assessment on PET is mildly limited due to respiratory motion, previous exam was a PET evaluation. Airways are patent. 11 mm sub solid area in the anterior LEFT upper lobe and another small area in the posterior LEFT upper lobe are stable. Musculoskeletal: Subacute rib fractures of RIGHT posterior ribs with similar appearance. See below for full musculoskeletal detail. CT ABDOMEN PELVIS FINDINGS Hepatobiliary: Liver normal in size  and contour without focal, suspicious hepatic lesion. The portal vein is patent. Hepatic veins are patent. Cholelithiasis without pericholecystic stranding. No biliary duct dilation. Pancreas: Pancreas normal without ductal  dilation or inflammation. Spleen: Spleen normal in size and contour without focal lesion. Adrenals/Urinary Tract: Adrenal glands are normal. Renal enhancement symmetric on corticomedullary with striated nephrogram on nephrographic phase. No hydronephrosis. Urinary bladder unremarkable. Stomach/Bowel: No acute gastrointestinal process. Is normal appendix. Vascular/Lymphatic: Calcified and noncalcified atheromatous plaque of the abdominal aorta. The There is no gastrohepatic or hepatoduodenal ligament lymphadenopathy. No retroperitoneal or mesenteric lymphadenopathy. No pelvic sidewall lymphadenopathy. Reproductive: No adnexal mass. Other: Umbilical and ventral hernias containing fat without change. No ascites. Musculoskeletal: No acute musculoskeletal process. IMPRESSION: 1. nephrogram bilaterally, could be seen in the setting of pyelonephritis. Correlate with any clinical or laboratory evidence of urinary tract infection 2. Decrease in size of the RIGHT paraspinal mass along the medial aspect of the RIGHT upper lobe when compared to previous PET and CT imaging. 3. Nodules along the minor fissure in the RIGHT upper lobe, dominant nodule approximately 15 mm, within 1-2 mm of its measurement on the recent PET and enlarged from 11 mm when compared to the January 2021 evaluation. 4. LEFT upper lobe nodules with sub solid appearance are unchanged, dominant area on image 44 series 6 at 11 mm. 5. Mild enlargement of RIGHT hilar nodal tissue, not pathologic by size criteria on CT criteria on CT 11 mm. Attention on follow-up given slight enlargement of RIGHT upper lobe nodule along the fissure as discussed. 6. Subacute rib fractures of RIGHT posterior ribs with similar appearance. See below for full musculoskeletal detail. 7. Cholelithiasis without evidence of acute cholecystitis. 8. Aortic atherosclerosis. These results will be called to the ordering clinician or representative by the Radiologist Assistant, and communication  documented in the PACS or [employer]. Aortic Atherosclerosis (ICD10-I70.0). Electronically Signed   By: Zetta Bills M.D.   On: 11/30/2019 10:37    ASSESSMENT AND PLAN: This is a very pleasant 59 years old African-American female with a stage IV non-small cell lung cancer, adenocarcinoma presented with large right upper lobe lung mass in addition to suspicious right paratracheal lymphadenopathy and bilateral pulmonary nodules as well as retroperitoneal lymphadenopathy diagnosed in May 2021 with no actionable mutations and negative PD-L1 expression. The patient is currently undergoing systemic chemotherapy with carboplatin for AUC of 5, Alimta 500 mg/M2 and Keytruda 200 mg IV every 3 weeks status post 4 cycles.  Starting from cycle #5 the patient is on treatment with maintenance therapy with Alimta and Keytruda every 3 weeks.   She tolerated the last cycle of her treatment fairly well. I recommended for the patient to proceed with cycle #5 today as planned. I will see her back for follow-up visit in 3 weeks for evaluation before starting cycle #6. For the nausea she will continue on her current treatment with Compazine. The patient was advised to call immediately if she has any concerning symptoms in the interval.  The patient voices understanding of current disease status and treatment options and is in agreement with the current care plan.  All questions were answered. The patient knows to call the clinic with any problems, questions or concerns. We can certainly see the patient much sooner if necessary.   Disclaimer: This note was dictated with voice recognition software. Similar sounding words can inadvertently be transcribed and may not be corrected upon review.

## 2019-12-27 NOTE — Patient Instructions (Signed)

## 2019-12-27 NOTE — Progress Notes (Signed)
G-CSF not needed w/ maint Alimta/Keytruda per MD.  Orders removed from tx plan.  Kennith Center, Pharm.D., CPP 12/27/2019@2 :13 PM

## 2019-12-27 NOTE — Patient Instructions (Addendum)
[facility] Discharge Instructions for Patients Receiving Chemotherapy  Today you received the following chemotherapy agents Pembrolizumab (KEYTRUDA), Pemetrexed (ALIMTA)  (PARAPLATIN).  To help prevent nausea and vomiting after your treatment, we encourage you to take your nausea medication as prescribed.   If you develop nausea and vomiting that is not controlled by your nausea medication, call the clinic.   BELOW ARE SYMPTOMS THAT SHOULD BE REPORTED IMMEDIATELY:  *FEVER GREATER THAN 100.5 F  *CHILLS WITH OR WITHOUT FEVER  NAUSEA AND VOMITING THAT IS NOT CONTROLLED WITH YOUR NAUSEA MEDICATION  *UNUSUAL SHORTNESS OF BREATH  *UNUSUAL BRUISING OR BLEEDING  TENDERNESS IN MOUTH AND THROAT WITH OR WITHOUT PRESENCE OF ULCERS  *URINARY PROBLEMS  *BOWEL PROBLEMS  UNUSUAL RASH Items with * indicate a potential emergency and should be followed up as soon as possible.  Feel free to call the clinic should you have any questions or concerns. The clinic phone number is [phone].  Please show the [place] at check-in to the Emergency Department and triage nurse.

## 2019-12-29 ENCOUNTER — Inpatient Hospital Stay: Payer: Medicaid Other

## 2019-12-29 NOTE — Progress Notes (Signed)
The patient was seen in the flush room. She had noted a small pustule lateral to the superior incision site above her right chest wall Port-A-Cath. No drainage was noted. The patient denied fevers, chills, or sweats. She was told to place a warm compress on the area as that it could simply be an undissolved suture. She was told to call immediately should she develop fevers, chills, sweats, erythema or exudate at that area. She expressed understanding and agreement with this plan.  Sandi Mealy, MHS, PA-C Physician Assistant

## 2020-01-06 MED FILL — POTASSIUM CHLORIDE CRYS ER: 20 | 10 days supply | Qty: 10 | Fill #0

## 2020-01-10 ENCOUNTER — Encounter: Payer: Self-pay | Admitting: General Practice

## 2020-01-10 NOTE — Progress Notes (Signed)
[facility] Progress Notes  Email from [facility] where patient had been referred for assistance in filing disability application: "we were able to confirm with [employer] that Brenda [name] has a case pending at the hearing level. They did upload the medical records we submitted with the claim we attempted to file for her. I will be sending a withdrawal in for [name] as we are unable to represent a client at the hearing stage."  Patient will need to continue to work w whomever is currently assisting her w filing an application for disability benefits.  Edwyna Shell, LCSW Clinical Social Worker Phone:  [phone]

## 2020-01-12 NOTE — Progress Notes (Signed)
[facility] OFFICE PROGRESS NOTE  Pcp, No No address on file  DIAGNOSIS: Stage IV (T2 a, N2, M1 B) non-small cell lung cancer, adenocarcinoma presented with large right upper lobe lung mass in addition to suspicious right paratracheal lymphadenopathy and bilateral pulmonary nodules as well as retroperitoneal lymphadenopathy diagnosed in May 2021. The patient has no actionable mutations and has negative PD-L1 expression on molecular studies by foundation 1.  PRIOR THERAPY: None  CURRENT THERAPY: Systemic chemotherapy with carboplatin for an AUC of 5, Alimta 500 mg/m2, and Keytruda 200 mg IV every 3 weeks. First dose 09/28/2019. Status post 5 cycles.Starting from cycle #5 she has been on maintenance alimta and [place].   INTERVAL HISTORY: Brenda [name] 59 y.o. female returns to the clinic today for a follow up visit.The patient is feeling well today without any concerning complaints.She continues to tolerate her treatment well without any adverse effects.She had received G-CSF in the past for neutropenia but this was discontinued with her last cycle of treatment when she started on maintenance treatment. Denies any fever, chills, night sweats, or weight loss. Denies any chest pain or hemoptysis.She reports intermittent coughwith associatedphlegm. She also has some baseline dyspnea on exertion. She states she will feel short of breath if she goes up more than 10-11steps.Denies any nausea, vomiting, diarrhea, or constipation. Denies any headache or visual changes besides her baseline visual changes for which she uses reading glasses. She denies every having her vision checked.She denies any signs or symptoms of infection including skin infections, nasal congestion, sore throat, or dysuria.The patient is here today for evaluationbefore staring cycle #6.    MEDICAL HISTORY: Past Medical History:  Diagnosis Date  . Breast cancer ([place])   . Depression   . Mental disorder      ALLERGIES:  is allergic to aspirin adult low [aspirin].  MEDICATIONS:  Current Outpatient Medications  Medication Sig Dispense Refill  . calcium carbonate (OSCAL) 1500 (600 Ca) MG TABS tablet Take 600 mg by mouth daily.    . folic acid (FOLVITE) 1 MG tablet Take 1 tablet (1 mg total) by mouth daily. 30 tablet 4  . lidocaine-prilocaine (EMLA) cream Apply 1 application topically as needed. 30 g 2  . potassium chloride SA (KLOR-CON) 20 MEQ tablet Take 1 tablet (20 mEq total) by mouth daily. 10 tablet 0  . prochlorperazine (COMPAZINE) 10 MG tablet Take 1 tablet (10 mg total) by mouth every 6 (six) hours as needed for nausea or vomiting. 30 tablet 3   Current Facility-Administered Medications  Medication Dose Route Frequency Provider Last Rate Last Admin  . sodium chloride flush (NS) 0.9 % injection 10 mL  10 mL Intravenous PRN TRUE Shackleford L, PA-C   10 mL at 01/17/20 1202    SURGICAL HISTORY:  Past Surgical History:  Procedure Laterality Date  . IR IMAGING GUIDED PORT INSERTION  11/03/2019  . TUBAL LIGATION    . VIDEO BRONCHOSCOPY WITH ENDOBRONCHIAL NAVIGATION N/A 04/04/2019   Procedure: VIDEO BRONCHOSCOPY WITH ENDOBRONCHIAL NAVIGATION;  Surgeon: Garner Nash, DO;  Location: [facility];  Service: Thoracic;  Laterality: N/A;  . VIDEO BRONCHOSCOPY WITH ENDOBRONCHIAL ULTRASOUND N/A 04/04/2019   Procedure: VIDEO BRONCHOSCOPY WITH ENDOBRONCHIAL ULTRASOUND;  Surgeon: Garner Nash, DO;  Location: [facility];  Service: Thoracic;  Laterality: N/A;    REVIEW OF SYSTEMS:   Constitutional: Negative for appetite change, chills, fatigue, fever and unexpected weight change.  HENT: Negative for mouth sores, nosebleeds, sore throat and trouble swallowing.  Eyes: Negative  for eye problems and icterus.  Respiratory:Positive for baseline dyspnea on exertion and mild cough.Negative for hemoptysis and wheezing.  Cardiovascular: Negative for chest pain and leg swelling.  Gastrointestinal:  Negative for abdominal pain, constipation, diarrhea, nausea and vomiting.  Genitourinary: Negative for bladder incontinence, difficulty urinating, dysuria, frequency and hematuria.  Musculoskeletal: Negative for back pain, gait problem, neck pain and neck stiffness.  Skin:No rashes or skin changes.  Neurological: Negative for dizziness, extremity weakness, gait problem, headaches, light-headedness and seizures.  Hematological: Negative for adenopathy. Does not bruise/bleed easily.  Psychiatric/Behavioral: Negative for confusion, depression and sleep disturbance. The patient is not nervous/anxious.   PHYSICAL EXAMINATION:  Blood pressure 121/82, pulse 94, temperature (!) 96.7 F (35.9 C), temperature source Tympanic, resp. rate 18, height _0  (1.676 m), weight 109 lb 6.4 oz (49.6 kg), last menstrual period 01/19/2011, SpO2 100 %.  ECOG PERFORMANCE STATUS: 1 - Symptomatic but completely ambulatory  Physical Exam  Constitutional: Oriented to person, place, and time and well-developed, well-nourished, and in no distress.  HENT:  Head: Normocephalic and atraumatic.  Mouth/Throat: Oropharynx is clear and moist. No oropharyngeal exudate.  Eyes: Conjunctivae are normal. Right eye exhibits no discharge. Left eye exhibits no discharge. No scleral icterus.  Neck: Normal range of motion. Neck supple.  Cardiovascular: Normal rate, regular rhythm, normal heart sounds and intact distal pulses.   Pulmonary/Chest: Effort normal and breath sounds normal. No respiratory distress. No wheezes. No rales.  Abdominal: Soft. Bowel sounds are normal. Exhibits no distension and no mass. There is no tenderness.  Musculoskeletal: Normal range of motion. Exhibits no edema.  Lymphadenopathy:    No cervical adenopathy.  Neurological: Alert and oriented to person, place, and time. Exhibits normal muscle tone. Gait normal. Coordination normal.  Skin: Skin is warm and dry. No rash noted. Not diaphoretic. No erythema.  No pallor.  Psychiatric: Mood, memory and judgment normal.  Vitals reviewed.  LABORATORY DATA: Lab Results  Component Value Date   WBC 2.8 (L) 01/17/2020   HGB 9.2 (L) 01/17/2020   HCT 27.1 (L) 01/17/2020   MCV 108.0 (H) 01/17/2020   PLT 308 01/17/2020      Chemistry      Component Value Date/Time   NA 142 01/17/2020 1107   K 3.6 01/17/2020 1107   CL 110 01/17/2020 1107   CO2 21 (L) 01/17/2020 1107   BUN 4 (L) 01/17/2020 1107   CREATININE 0.72 01/17/2020 1107      Component Value Date/Time   CALCIUM 9.0 01/17/2020 1107   ALKPHOS 199 (H) 01/17/2020 1107   AST 82 (H) 01/17/2020 1107   ALT 54 (H) 01/17/2020 1107   BILITOT 0.2 (L) 01/17/2020 1107       RADIOGRAPHIC STUDIES:  No results found.   ASSESSMENT/PLAN:  This is a very pleasant 59 year old African-American female recently diagnosed with a stage IV (T2 a, N2, M1 B) non-small cell lung cancer, adenocarcinoma presented with large right upper lobe lung mass in addition to suspicious right paratracheal lymphadenopathy and bilateral pulmonary nodules as well as retroperitoneal lymphadenopathy diagnosed in May 2021. The patient has no actionable mutations and has negative PD-L1 expression on molecular studies by foundation 1.  The patient is currently undergoing palliative systemic chemotherapy with carboplatin for AUC of 5, Alimta 500 mg/M2 and Keytruda 200 mg IV every 3 weeks. She is status post 5 cycles.Starting from cycle #5, she has been on maintenance alimta and Keytruda.   I reviewed the patient's labs with Dr. Julien Nordmann. Her  ANC is 1.2 today. She no longer receives G-CSF. Dr. Julien Nordmann recommends that she delay treatment by 1 week.   I will delay her treatment by 1 week. She will proceed with infusion with cycle #6 of keytruda and alimta next week if she has improvement in her neutropenia.   I will arrange for a restaging CT scan of her chest, abdomen, and pelvis prior to starting cycle #7 of treatment in 4  weeks.  We will see her back for a follow up visit in 4 weeks for evaluation and to review her scan results before starting cycle #7.  I sent a refill of compazine to her pharmacy.   The patient was advised to call immediately if she has any concerning symptoms in the interval. The patient voices understanding of current disease status and treatment options and is in agreement with the current care plan. All questions were answered. The patient knows to call the clinic with any problems, questions or concerns. We can certainly see the patient much sooner if necessary        Orders Placed This Encounter  Procedures  . CT Chest W Contrast    Standing Status:   Future    Standing Expiration Date:   01/16/2021    Order Specific Question:   If indicated for the ordered procedure, I authorize the administration of contrast media per Radiology protocol    Answer:   Yes    Order Specific Question:   Is patient pregnant?    Answer:   No    Order Specific Question:   Preferred imaging location?    Answer:   [facility]  . CT Abdomen Pelvis W Contrast    Standing Status:   Future    Standing Expiration Date:   01/16/2021    Order Specific Question:   If indicated for the ordered procedure, I authorize the administration of contrast media per Radiology protocol    Answer:   Yes    Order Specific Question:   Is patient pregnant?    Answer:   No    Order Specific Question:   Preferred imaging location?    Answer:   [facility]    Order Specific Question:   Is Oral Contrast requested for this exam?    Answer:   Yes, Per Radiology protocol     [place], PA-C 01/17/20

## 2020-01-17 ENCOUNTER — Inpatient Hospital Stay: Payer: Medicaid Other

## 2020-01-17 ENCOUNTER — Other Ambulatory Visit: Payer: Self-pay | Admitting: Physician Assistant

## 2020-01-17 ENCOUNTER — Encounter: Payer: Self-pay | Admitting: Physician Assistant

## 2020-01-17 ENCOUNTER — Inpatient Hospital Stay (HOSPITAL_BASED_OUTPATIENT_CLINIC_OR_DEPARTMENT_OTHER): Payer: Medicaid Other | Admitting: Physician Assistant

## 2020-01-17 ENCOUNTER — Other Ambulatory Visit: Payer: Self-pay

## 2020-01-17 VITALS — BP 121/82 | HR 94 | Temp 96.7°F | Resp 18 | Ht 66.0 in | Wt 109.4 lb

## 2020-01-17 DIAGNOSIS — D709 Neutropenia, unspecified: Secondary | ICD-10-CM

## 2020-01-17 DIAGNOSIS — C3491 Malignant neoplasm of unspecified part of right bronchus or lung: Secondary | ICD-10-CM

## 2020-01-17 DIAGNOSIS — Z5111 Encounter for antineoplastic chemotherapy: Secondary | ICD-10-CM

## 2020-01-17 DIAGNOSIS — Z95828 Presence of other vascular implants and grafts: Secondary | ICD-10-CM

## 2020-01-17 DIAGNOSIS — Z5112 Encounter for antineoplastic immunotherapy: Secondary | ICD-10-CM | POA: Diagnosis not present

## 2020-01-17 LAB — CBC WITH DIFFERENTIAL (CANCER CENTER ONLY)
Abs Immature Granulocytes: 0.01 10*3/uL (ref 0.00–0.07)
Basophils Absolute: 0 10*3/uL (ref 0.0–0.1)
Basophils Relative: 1 %
Eosinophils Absolute: 0 10*3/uL (ref 0.0–0.5)
Eosinophils Relative: 0 %
HCT: 27.1 % — ABNORMAL LOW (ref 36.0–46.0)
Hemoglobin: 9.2 g/dL — ABNORMAL LOW (ref 12.0–15.0)
Immature Granulocytes: 0 %
Lymphocytes Relative: 43 %
Lymphs Abs: 1.2 10*3/uL (ref 0.7–4.0)
MCH: 36.7 pg — ABNORMAL HIGH (ref 26.0–34.0)
MCHC: 33.9 g/dL (ref 30.0–36.0)
MCV: 108 fL — ABNORMAL HIGH (ref 80.0–100.0)
Monocytes Absolute: 0.4 10*3/uL (ref 0.1–1.0)
Monocytes Relative: 13 %
Neutro Abs: 1.2 10*3/uL — ABNORMAL LOW (ref 1.7–7.7)
Neutrophils Relative %: 43 %
Platelet Count: 308 10*3/uL (ref 150–400)
RBC: 2.51 MIL/uL — ABNORMAL LOW (ref 3.87–5.11)
RDW: 17.2 % — ABNORMAL HIGH (ref 11.5–15.5)
WBC Count: 2.8 10*3/uL — ABNORMAL LOW (ref 4.0–10.5)
nRBC: 0 % (ref 0.0–0.2)

## 2020-01-17 LAB — CMP (CANCER CENTER ONLY)
ALT: 54 U/L — ABNORMAL HIGH (ref 0–44)
AST: 82 U/L — ABNORMAL HIGH (ref 15–41)
Albumin: 3.1 g/dL — ABNORMAL LOW (ref 3.5–5.0)
Alkaline Phosphatase: 199 U/L — ABNORMAL HIGH (ref 38–126)
Anion gap: 11 (ref 5–15)
BUN: 4 mg/dL — ABNORMAL LOW (ref 6–20)
CO2: 21 mmol/L — ABNORMAL LOW (ref 22–32)
Calcium: 9 mg/dL (ref 8.9–10.3)
Chloride: 110 mmol/L (ref 98–111)
Creatinine: 0.72 mg/dL (ref 0.44–1.00)
GFR, Estimated: >60 mL/min (ref >60)
Glucose, Bld: 74 mg/dL (ref 70–99)
Potassium: 3.6 mmol/L (ref 3.5–5.1)
Sodium: 142 mmol/L (ref 135–145)
Total Bilirubin: 0.2 mg/dL — ABNORMAL LOW (ref 0.3–1.2)
Total Protein: 7.1 g/dL (ref 6.5–8.1)

## 2020-01-17 LAB — TSH: TSH: 1.574 u[IU]/mL (ref 0.308–3.960)

## 2020-01-17 MED ORDER — SODIUM CHLORIDE 0.9% FLUSH
10.0000 mL | INTRAVENOUS | Status: DC | PRN
  Administered 2020-01-17: 10 mL via INTRAVENOUS
  Filled 2020-01-17: qty 10

## 2020-01-17 MED ORDER — PROCHLORPERAZINE MALEATE 10 MG PO TABS: 10.0000 mg | ORAL_TABLET | Freq: Four times a day (QID) | ORAL | 3 refills | Status: DC | PRN

## 2020-01-17 MED ORDER — SODIUM CHLORIDE 0.9% FLUSH
10.0000 mL | Freq: Once | INTRAVENOUS | Status: AC
  Administered 2020-01-17: 10 mL via INTRAVENOUS
  Filled 2020-01-17: qty 10

## 2020-01-17 MED ORDER — HEPARIN SOD (PORK) LOCK FLUSH 100 UNIT/ML IV SOLN
500.0000 [IU] | Freq: Once | INTRAVENOUS | Status: AC
  Administered 2020-01-17: 500 [IU] via INTRAVENOUS
  Filled 2020-01-17: qty 5

## 2020-01-17 MED FILL — PROCHLORPERAZINE 10 MG TAB: 10 | 7 days supply | Qty: 30 | Fill #0

## 2020-01-18 MED ORDER — ACETAMINOPHEN 325 MG PO TABS
ORAL_TABLET | ORAL | Status: AC
  Filled 2020-01-18: qty 2

## 2020-01-18 MED ORDER — DIPHENHYDRAMINE HCL 25 MG PO CAPS
ORAL_CAPSULE | ORAL | Status: AC
  Filled 2020-01-18: qty 2

## 2020-01-19 ENCOUNTER — Inpatient Hospital Stay: Payer: Medicaid Other

## 2020-01-26 ENCOUNTER — Telehealth: Payer: Self-pay

## 2020-01-26 ENCOUNTER — Inpatient Hospital Stay: Payer: Medicaid Other

## 2020-01-26 ENCOUNTER — Other Ambulatory Visit: Payer: Self-pay

## 2020-01-26 ENCOUNTER — Inpatient Hospital Stay: Payer: Medicaid Other | Attending: Internal Medicine

## 2020-01-26 VITALS — BP 145/86 | HR 94 | Temp 98.3°F | Resp 17 | Wt 112.0 lb

## 2020-01-26 DIAGNOSIS — J439 Emphysema, unspecified: Secondary | ICD-10-CM | POA: Insufficient documentation

## 2020-01-26 DIAGNOSIS — Z886 Allergy status to analgesic agent status: Secondary | ICD-10-CM | POA: Insufficient documentation

## 2020-01-26 DIAGNOSIS — Z5112 Encounter for antineoplastic immunotherapy: Secondary | ICD-10-CM | POA: Diagnosis present

## 2020-01-26 DIAGNOSIS — Z853 Personal history of malignant neoplasm of breast: Secondary | ICD-10-CM | POA: Diagnosis not present

## 2020-01-26 DIAGNOSIS — I700 Atherosclerosis of aorta: Secondary | ICD-10-CM | POA: Diagnosis not present

## 2020-01-26 DIAGNOSIS — C778 Secondary and unspecified malignant neoplasm of lymph nodes of multiple regions: Secondary | ICD-10-CM | POA: Insufficient documentation

## 2020-01-26 DIAGNOSIS — I2510 Atherosclerotic heart disease of native coronary artery without angina pectoris: Secondary | ICD-10-CM | POA: Insufficient documentation

## 2020-01-26 DIAGNOSIS — Z5111 Encounter for antineoplastic chemotherapy: Secondary | ICD-10-CM | POA: Diagnosis present

## 2020-01-26 DIAGNOSIS — R5383 Other fatigue: Secondary | ICD-10-CM | POA: Diagnosis not present

## 2020-01-26 DIAGNOSIS — K8020 Calculus of gallbladder without cholecystitis without obstruction: Secondary | ICD-10-CM | POA: Diagnosis not present

## 2020-01-26 DIAGNOSIS — C3491 Malignant neoplasm of unspecified part of right bronchus or lung: Secondary | ICD-10-CM

## 2020-01-26 DIAGNOSIS — Z79899 Other long term (current) drug therapy: Secondary | ICD-10-CM | POA: Insufficient documentation

## 2020-01-26 DIAGNOSIS — R0600 Dyspnea, unspecified: Secondary | ICD-10-CM | POA: Insufficient documentation

## 2020-01-26 DIAGNOSIS — C3411 Malignant neoplasm of upper lobe, right bronchus or lung: Secondary | ICD-10-CM | POA: Insufficient documentation

## 2020-01-26 LAB — CBC WITH DIFFERENTIAL (CANCER CENTER ONLY)
Abs Immature Granulocytes: 0.01 10*3/uL (ref 0.00–0.07)
Basophils Absolute: 0 10*3/uL (ref 0.0–0.1)
Basophils Relative: 1 %
Eosinophils Absolute: 0 10*3/uL (ref 0.0–0.5)
Eosinophils Relative: 0 %
HCT: 30.2 % — ABNORMAL LOW (ref 36.0–46.0)
Hemoglobin: 10 g/dL — ABNORMAL LOW (ref 12.0–15.0)
Immature Granulocytes: 0 %
Lymphocytes Relative: 35 %
Lymphs Abs: 1.3 10*3/uL (ref 0.7–4.0)
MCH: 35.7 pg — ABNORMAL HIGH (ref 26.0–34.0)
MCHC: 33.1 g/dL (ref 30.0–36.0)
MCV: 107.9 fL — ABNORMAL HIGH (ref 80.0–100.0)
Monocytes Absolute: 0.5 10*3/uL (ref 0.1–1.0)
Monocytes Relative: 15 %
Neutro Abs: 1.8 10*3/uL (ref 1.7–7.7)
Neutrophils Relative %: 49 %
Platelet Count: 110 10*3/uL — ABNORMAL LOW (ref 150–400)
RBC: 2.8 MIL/uL — ABNORMAL LOW (ref 3.87–5.11)
RDW: 15.8 % — ABNORMAL HIGH (ref 11.5–15.5)
WBC Count: 3.7 10*3/uL — ABNORMAL LOW (ref 4.0–10.5)
nRBC: 0 % (ref 0.0–0.2)

## 2020-01-26 LAB — CMP (CANCER CENTER ONLY)
ALT: 37 U/L (ref 0–44)
AST: 44 U/L — ABNORMAL HIGH (ref 15–41)
Albumin: 3 g/dL — ABNORMAL LOW (ref 3.5–5.0)
Alkaline Phosphatase: 181 U/L — ABNORMAL HIGH (ref 38–126)
Anion gap: 7 (ref 5–15)
BUN: <4 mg/dL — ABNORMAL LOW (ref 6–20)
CO2: 27 mmol/L (ref 22–32)
Calcium: 8.8 mg/dL — ABNORMAL LOW (ref 8.9–10.3)
Chloride: 107 mmol/L (ref 98–111)
Creatinine: 0.74 mg/dL (ref 0.44–1.00)
GFR, Estimated: >60 mL/min (ref >60)
Glucose, Bld: 96 mg/dL (ref 70–99)
Potassium: 3.5 mmol/L (ref 3.5–5.1)
Sodium: 141 mmol/L (ref 135–145)
Total Bilirubin: 0.3 mg/dL (ref 0.3–1.2)
Total Protein: 7 g/dL (ref 6.5–8.1)

## 2020-01-26 MED ORDER — SODIUM CHLORIDE 0.9 % IV SOLN
500.0000 mg/m2 | Freq: Once | INTRAVENOUS | Status: AC
  Administered 2020-01-26: 800 mg via INTRAVENOUS
  Filled 2020-01-26: qty 20

## 2020-01-26 MED ORDER — CYANOCOBALAMIN 1000 MCG/ML IJ SOLN
INTRAMUSCULAR | Status: AC
  Filled 2020-01-26: qty 1

## 2020-01-26 MED ORDER — CYANOCOBALAMIN 1000 MCG/ML IJ SOLN
1000.0000 ug | Freq: Once | INTRAMUSCULAR | Status: AC
  Administered 2020-01-26: 1000 ug via INTRAMUSCULAR

## 2020-01-26 MED ORDER — SODIUM CHLORIDE 0.9 % IV SOLN
200.0000 mg | Freq: Once | INTRAVENOUS | Status: AC
  Administered 2020-01-26: 200 mg via INTRAVENOUS
  Filled 2020-01-26: qty 8

## 2020-01-26 MED ORDER — SODIUM CHLORIDE 0.9 % IV SOLN
Freq: Once | INTRAVENOUS | Status: AC
  Filled 2020-01-26: qty 250

## 2020-01-26 MED ORDER — PROCHLORPERAZINE MALEATE 10 MG PO TABS
10.0000 mg | ORAL_TABLET | Freq: Once | ORAL | Status: AC
  Administered 2020-01-26: 10 mg via ORAL

## 2020-01-26 MED ORDER — PROCHLORPERAZINE MALEATE 10 MG PO TABS
ORAL_TABLET | ORAL | Status: AC
  Filled 2020-01-26: qty 1

## 2020-01-26 MED ORDER — HEPARIN SOD (PORK) LOCK FLUSH 100 UNIT/ML IV SOLN
500.0000 [IU] | Freq: Once | INTRAVENOUS | Status: AC | PRN
  Administered 2020-01-26: 500 [IU]
  Filled 2020-01-26: qty 5

## 2020-01-26 MED ORDER — SODIUM CHLORIDE 0.9% FLUSH
10.0000 mL | INTRAVENOUS | Status: DC | PRN
  Administered 2020-01-26: 10 mL
  Filled 2020-01-26: qty 10

## 2020-01-26 MED FILL — PROCHLORPERAZINE 10 MG TAB: 10 | 7 days supply | Qty: 30 | Fill #0

## 2020-01-26 NOTE — Patient Instructions (Signed)
[facility] Discharge Instructions for Patients Receiving Chemotherapy  Today you received the following chemotherapy agents Pembrolizumab (KEYTRUDA), Pemetrexed (ALIMTA).  To help prevent nausea and vomiting after your treatment, we encourage you to take your nausea medication as prescribed.   If you develop nausea and vomiting that is not controlled by your nausea medication, call the clinic.   BELOW ARE SYMPTOMS THAT SHOULD BE REPORTED IMMEDIATELY:  *FEVER GREATER THAN 100.5 F  *CHILLS WITH OR WITHOUT FEVER  NAUSEA AND VOMITING THAT IS NOT CONTROLLED WITH YOUR NAUSEA MEDICATION  *UNUSUAL SHORTNESS OF BREATH  *UNUSUAL BRUISING OR BLEEDING  TENDERNESS IN MOUTH AND THROAT WITH OR WITHOUT PRESENCE OF ULCERS  *URINARY PROBLEMS  *BOWEL PROBLEMS  UNUSUAL RASH Items with * indicate a potential emergency and should be followed up as soon as possible.  Feel free to call the clinic should you have any questions or concerns. The clinic phone number is [phone].  Please show the [facility] at check-in to the Emergency Department and triage nurse.

## 2020-01-26 NOTE — Telephone Encounter (Signed)
Pt is requesting a refill of the following:  Potassium chloride SA (KLOR-CON) 20 MEQ tablet

## 2020-01-28 NOTE — Telephone Encounter (Signed)
K is normal. No need for refill for now.

## 2020-02-08 ENCOUNTER — Ambulatory Visit (HOSPITAL_COMMUNITY)
Admission: RE | Admit: 2020-02-08 | Discharge: 2020-02-08 | Disposition: A | Discharge status: Home / Self Care | Payer: Medicaid Other | Source: Ambulatory Visit | Attending: Physician Assistant | Admitting: Physician Assistant

## 2020-02-08 ENCOUNTER — Other Ambulatory Visit: Payer: Self-pay

## 2020-02-08 DIAGNOSIS — C3491 Malignant neoplasm of unspecified part of right bronchus or lung: Secondary | ICD-10-CM | POA: Insufficient documentation

## 2020-02-08 DIAGNOSIS — Z5111 Encounter for antineoplastic chemotherapy: Secondary | ICD-10-CM | POA: Diagnosis present

## 2020-02-08 IMAGING — CT CT CHEST W/ CM
2 of 5 series · 12 of 36 positions shown, 15 images · IV contrast (APPLIED)
Comparison: Most recent CT chest, abdomen and pelvis [DATE].
[DATE] PET-CT.

CLINICAL DATA: Primary Cancer Type: Lung
TECHNIQUE: Multidetector CT imaging of the chest, abdomen and pelvis was
performed following the standard protocol during bolus
administration of intravenous contrast.

CONTRAST:  100mL OMNIPAQUE IOHEXOL 300 MG/ML  SOLN

[Series 2: cap with · axial · 0.61mm/px · z∈[-337,+163]mm · 9 of 126 slices shown, 12 images]
[im 13/126  mediastinal]
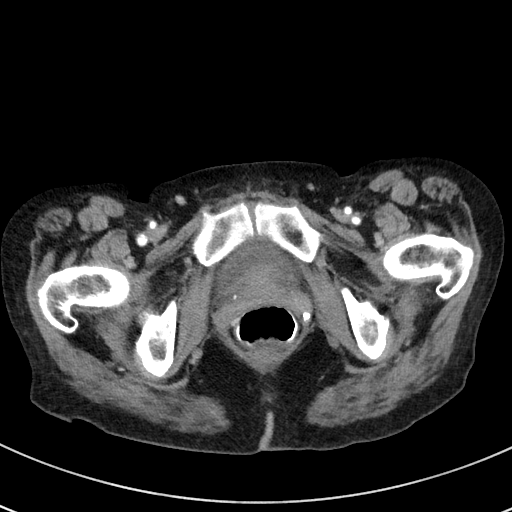
[im 13/126  lung]
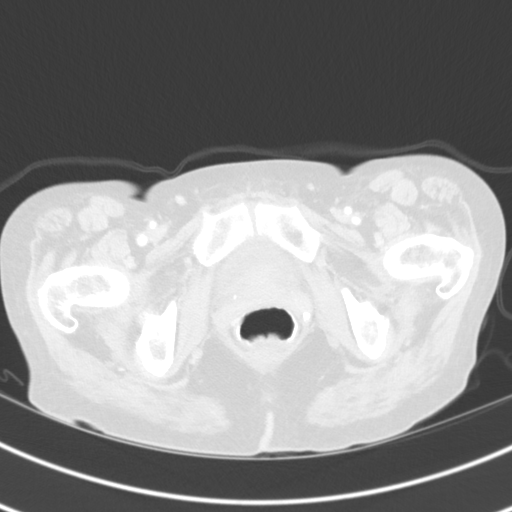
[im 26/126  lung]
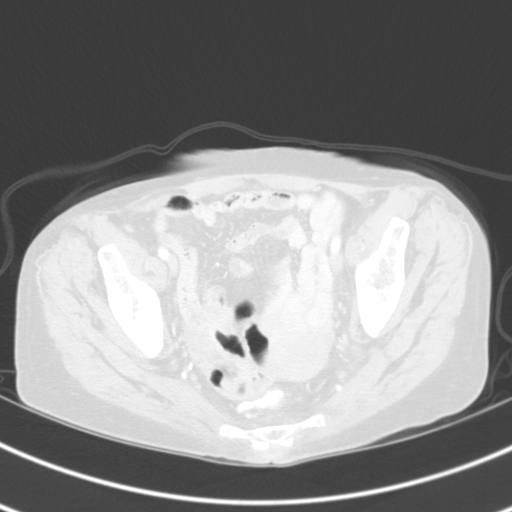
[im 38/126  lung]
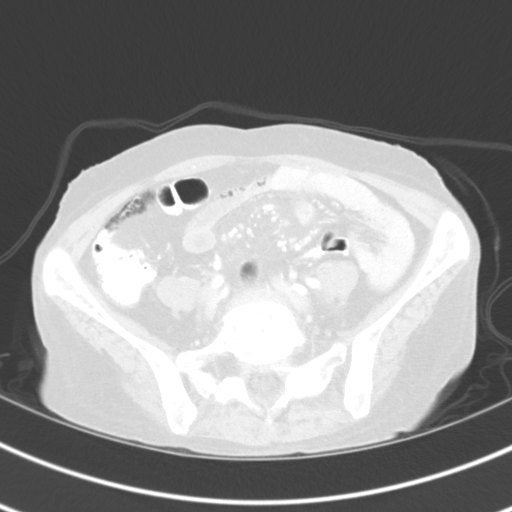
[im 51/126  lung]
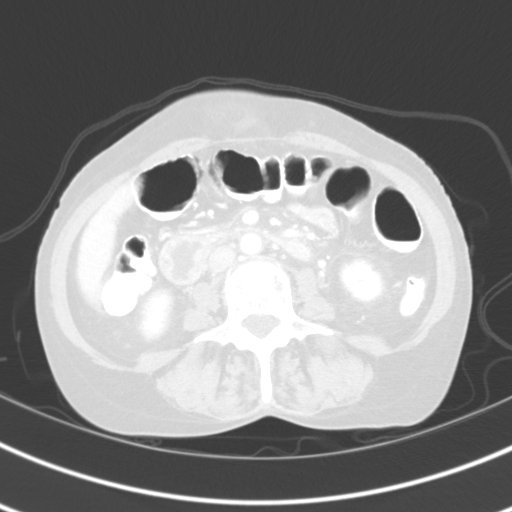
[im 63/126  mediastinal]
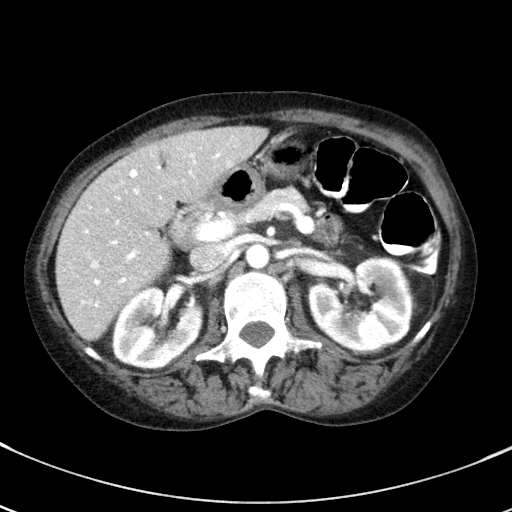
[im 63/126  lung]
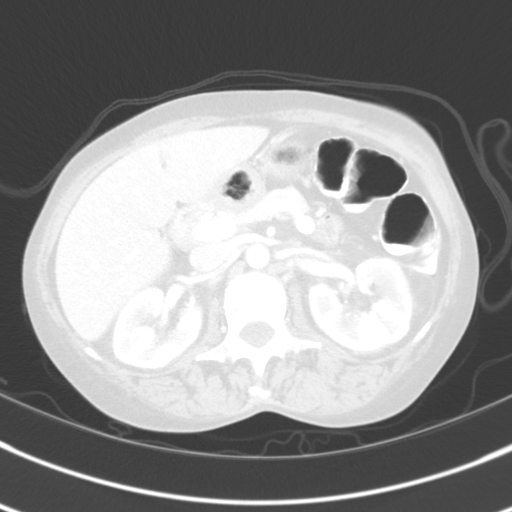
[im 76/126  lung]
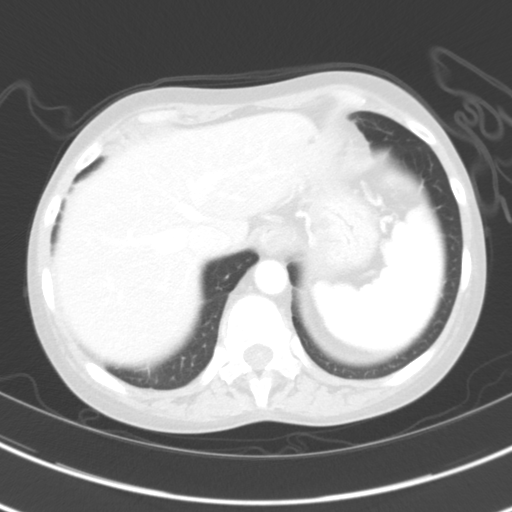
[im 88/126  lung]
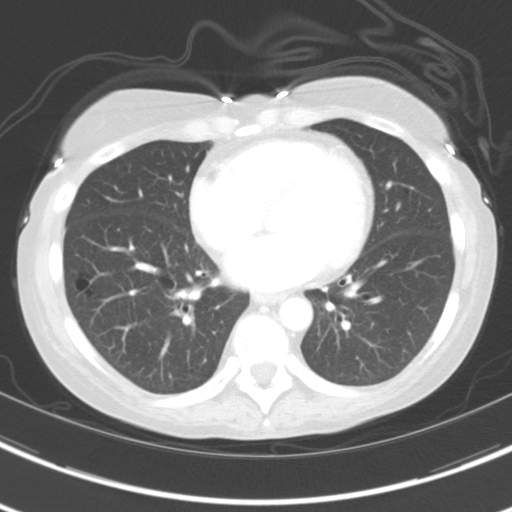
[im 101/126  lung]
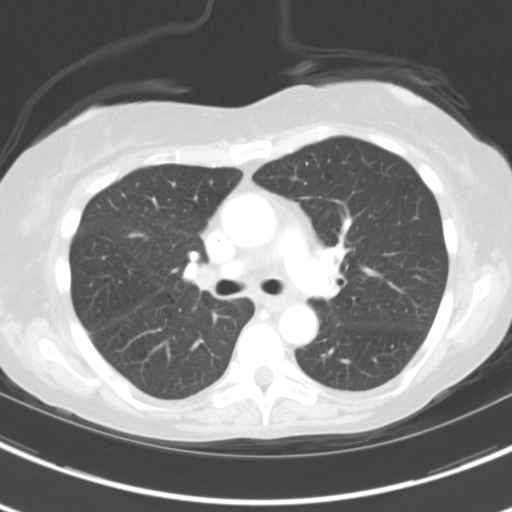
[im 113/126  mediastinal]
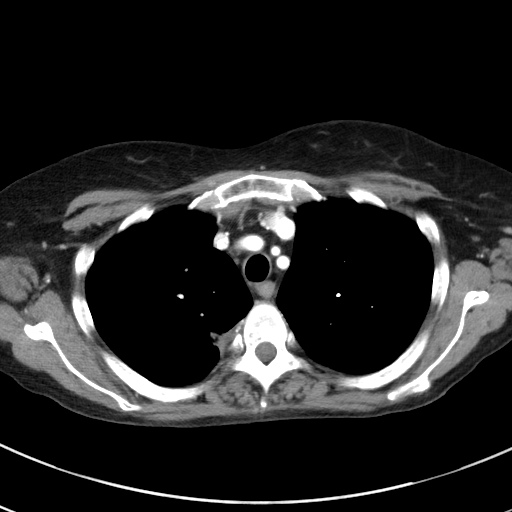
[im 113/126  lung]
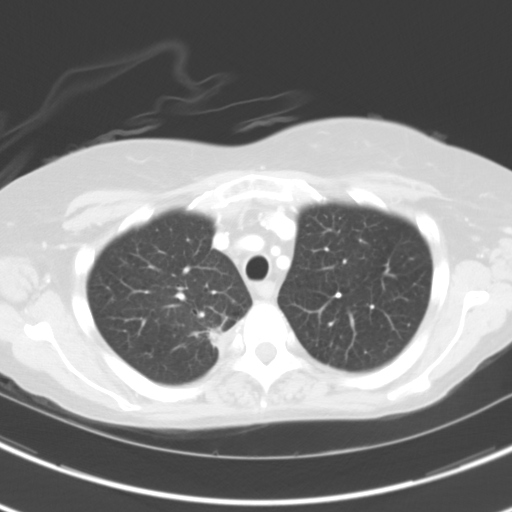

[Series 5: coronals · coronal · 0.66mm/px · 3 of 122 slices shown]
[im 25/122  lung]
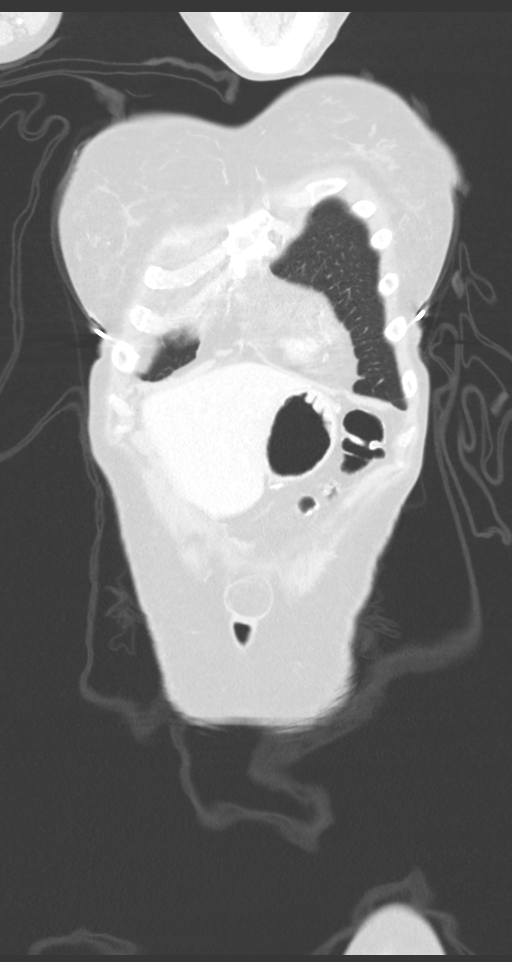
[im 49/122  lung]
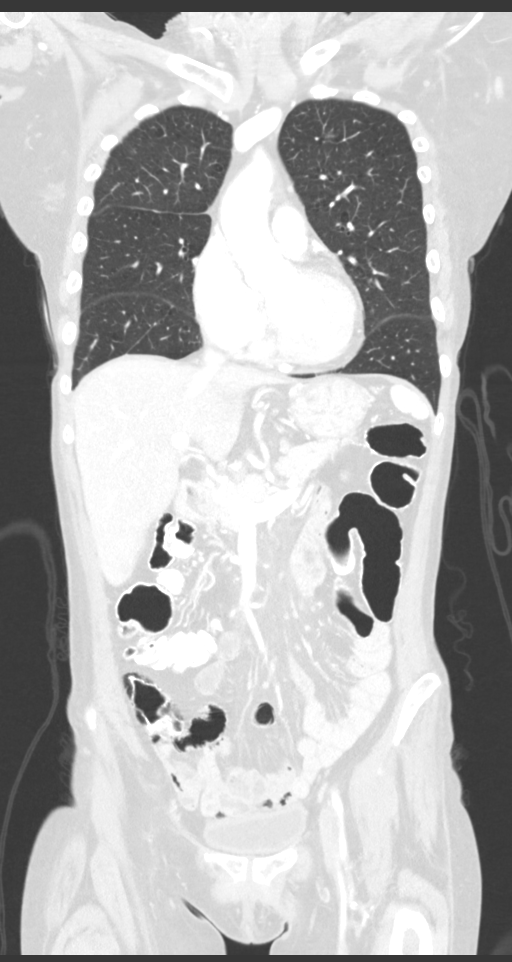
[im 73/122  lung]
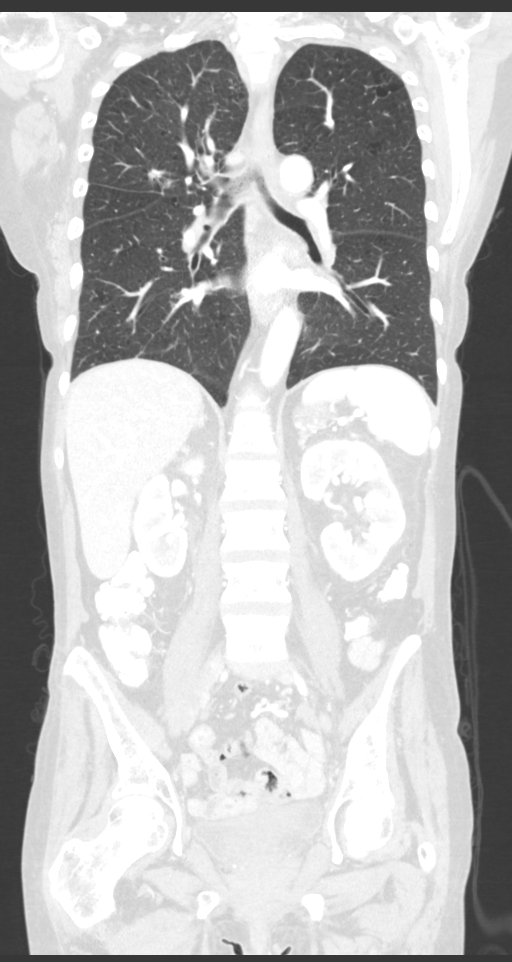

[12 of 36 positions shown; findings below may reference images not displayed]

Imaging Indication: Assess response to therapy

Interval therapy since last imaging? Yes

Initial Cancer Diagnosis

Date: [DATE]; Established by: Biopsy-proven

Detailed Pathology: Stage IV non-small cell lung cancer,
adenocarcinoma.

Primary Tumor location:  Right upper lobe.

Surgeries: No.

Chemotherapy: Yes; Ongoing? Yes; Most recent administration:
[DATE]

Immunotherapy?  Yes; Type: Keytruda; Ongoing? Yes

Radiation therapy? No

EXAM:
CT CHEST, ABDOMEN, AND PELVIS WITH CONTRAST
FINDINGS: CT CHEST FINDINGS

Cardiovascular: Aortic atherosclerosis. Tortuous thoracic aorta.
Normal heart size, without pericardial effusion. Lad coronary artery
calcification. Right Port-A-Cath tip mid right atrium. No central
pulmonary embolism, on this non-dedicated study.

Mediastinum/Nodes: No supraclavicular adenopathy. No mediastinal
adenopathy. Right hilar node of 1.0 cm is similar to 1.1 cm on the
prior, not pathologic by size criteria.

Lungs/Pleura: No pleural fluid.  Moderate centrilobular emphysema.

Posterior right upper lobe pleural-based lung lesion measures 2.5 x
2.3 Cm on 40/4. Compare 2.4 x 2.4 cm on the prior exam (when
remeasured).

A spiculated nodule along the right minor fissure measures 1.5 x
cm on 57/4. Compare similar on the prior exam (when remeasured).

Just central to this, nodularity along the more posterior minor
fissure measures 1.3 x 0.6 cm on 58/4 versus 1.2 x 0.8 cm on the
prior exam, suggesting stability.

Smaller nodules along the right minor fissure including on 100/4 are
unchanged on the order of 3 mm.

Left upper lobe ground-glass nodules, maximally 8 mm on 50/4.
Similar to on the prior exam (when remeasured).

Probable scarring within the posterior left apex on [DATE].

Musculoskeletal: Multiple remote right rib fractures.

CT ABDOMEN PELVIS FINDINGS

Hepatobiliary: Normal liver. Stones within a contracted gallbladder.
No acute cholecystitis or biliary duct dilatation.

Pancreas: Normal, without mass or ductal dilatation.

Spleen: Normal in size, without focal abnormality.

Adrenals/Urinary Tract: Normal adrenal glands. Left renal too small
to characterize lesions. Heterogeneous right greater than left renal
enhancement is most significant on delayed images, felt to be
similar. Normal urinary bladder.

Stomach/Bowel: Gastric cardia antral underdistention. Normal colon
and terminal ileum. Normal small bowel.

Vascular/Lymphatic: Aortic atherosclerosis. No abdominopelvic
adenopathy.

Reproductive: Central uterine body hyperenhancing 8 mm lesion is
likely a fibroid. No adnexal mass.

Other: No significant free fluid. Mild pelvic floor laxity. Ventral
abdominal wall hernias contain fat. No evidence of omental or
peritoneal disease.

Musculoskeletal: Mild disc bulge at L4-5.
IMPRESSION: 1. Similar right upper lobe/paraspinal lung lesion. Smaller nodules,
including a dominant inferior right upper lobe nodule contacting the
minor fissure are similar.
2. No thoracic adenopathy.
3. No findings of subdiaphragmatic metastatic disease.
4. Aortic atherosclerosis ([T4]-[T4]), coronary artery
atherosclerosis and emphysema ([T4]-[T4]).
5. Uterine fibroid.
6. Similar heterogeneous renal enhancement, worse on the right.
Suspicious for pyelonephritis.
7. Cholelithiasis.

## 2020-02-08 IMAGING — CT CT ABD-PELV W/ CM
2 of 5 series · 12 of 36 positions shown, 15 images · IV contrast (APPLIED)
Comparison: Most recent CT chest, abdomen and pelvis [DATE].
[DATE] PET-CT.

CLINICAL DATA: Primary Cancer Type: Lung
TECHNIQUE: Multidetector CT imaging of the chest, abdomen and pelvis was
performed following the standard protocol during bolus
administration of intravenous contrast.

CONTRAST:  100mL OMNIPAQUE IOHEXOL 300 MG/ML  SOLN

[Series 2: cap with · axial · 0.61mm/px · z∈[-337,+163]mm · 9 of 126 slices shown, 12 images]
[im 13/126  mediastinal]
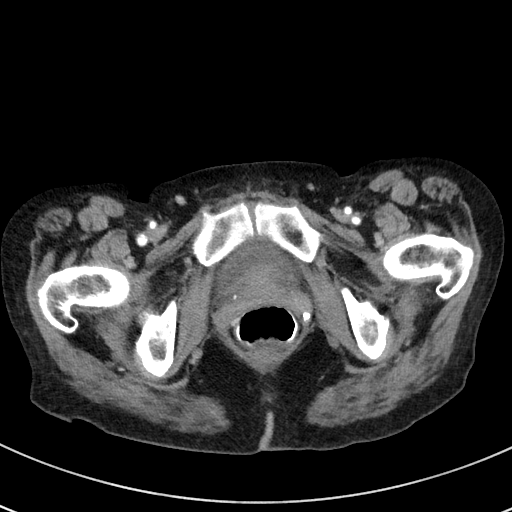
[im 13/126  lung]
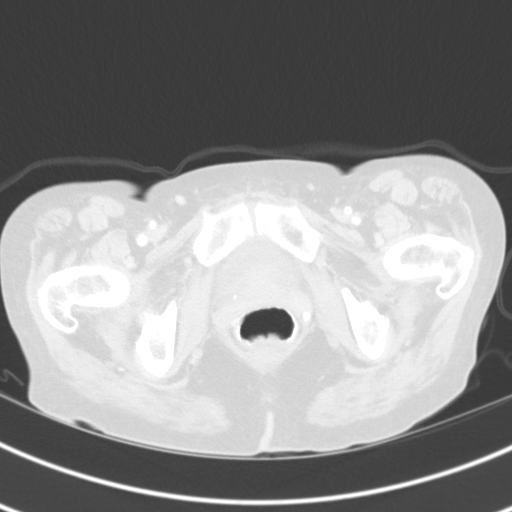
[im 26/126  lung]
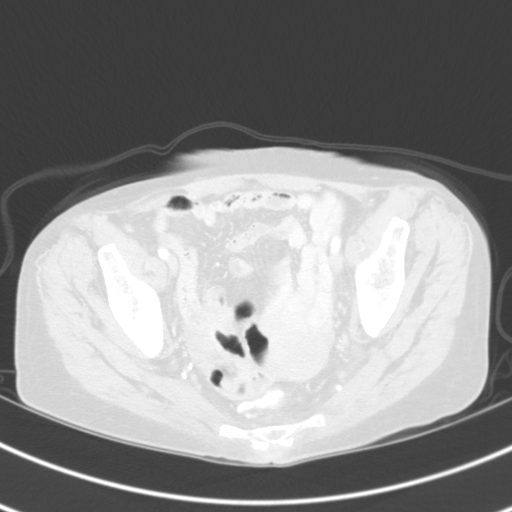
[im 38/126  lung]
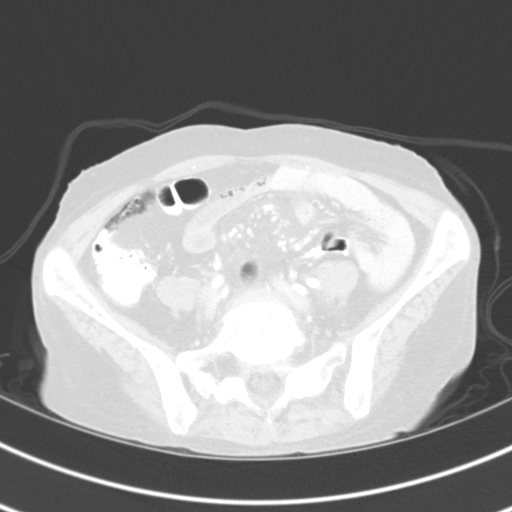
[im 51/126  lung]
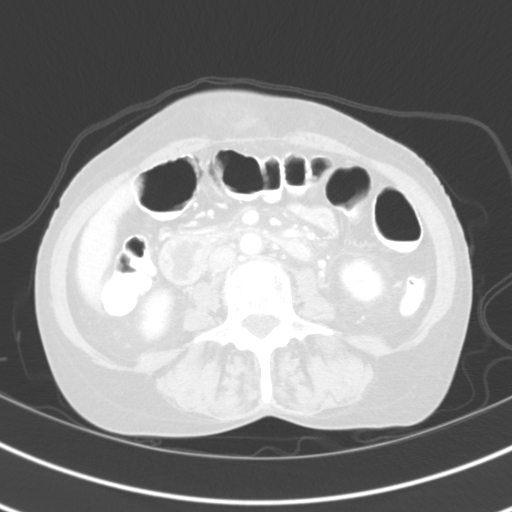
[im 63/126  mediastinal]
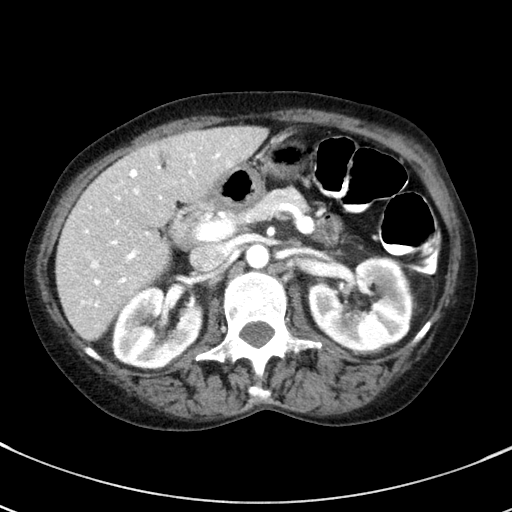
[im 63/126  lung]
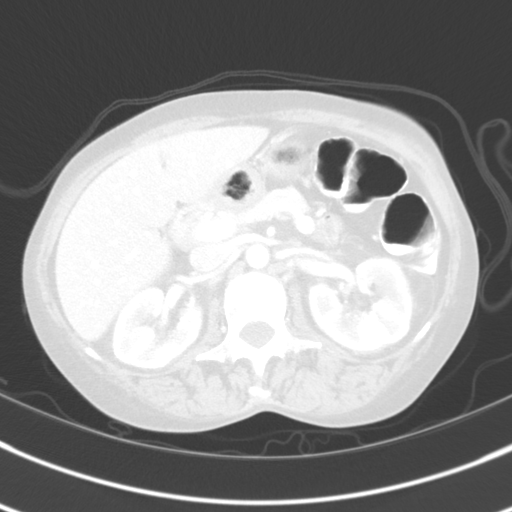
[im 76/126  lung]
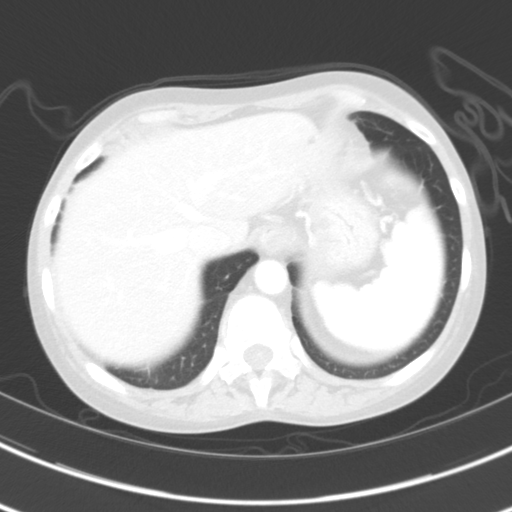
[im 88/126  lung]
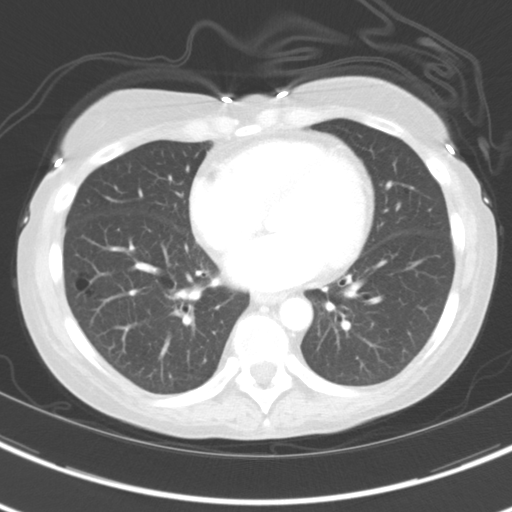
[im 101/126  lung]
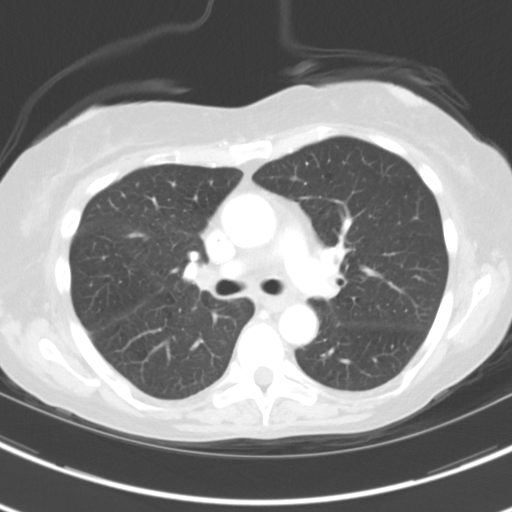
[im 113/126  mediastinal]
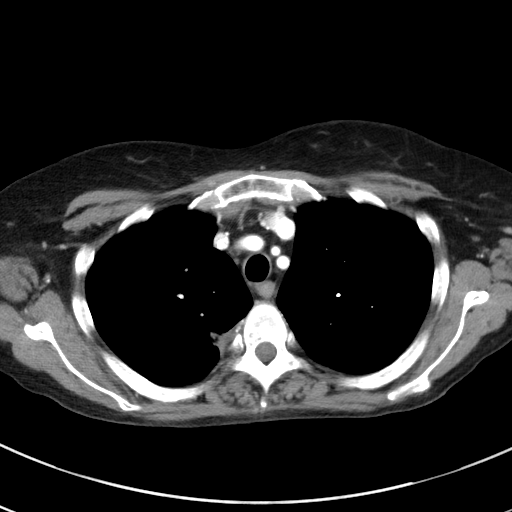
[im 113/126  lung]
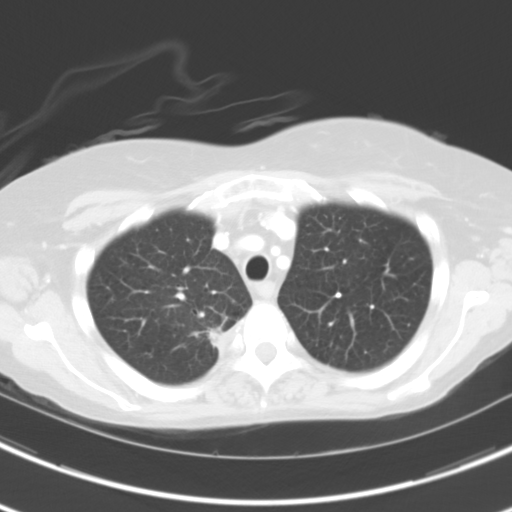

[Series 5: coronals · coronal · 0.66mm/px · 3 of 122 slices shown]
[im 25/122  lung]
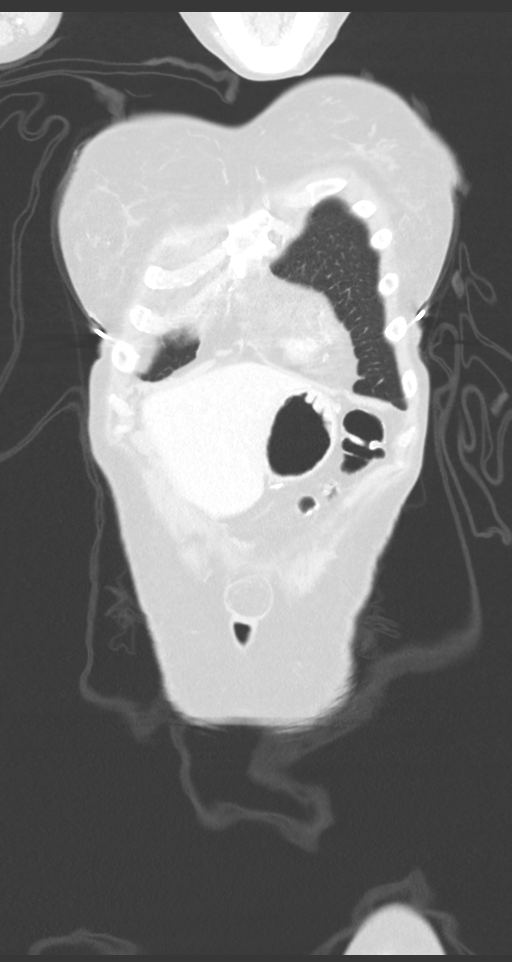
[im 49/122  lung]
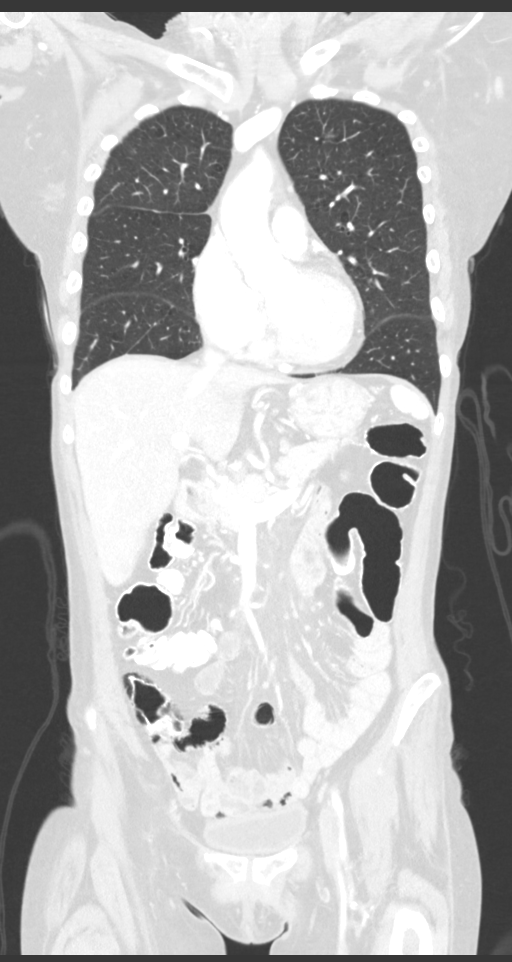
[im 73/122  lung]
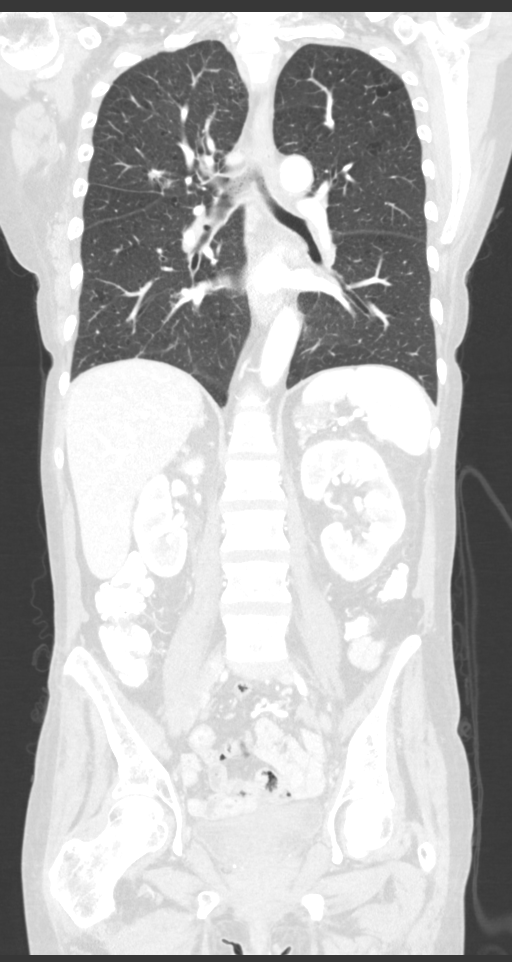

[12 of 36 positions shown; findings below may reference images not displayed]

Imaging Indication: Assess response to therapy

Interval therapy since last imaging? Yes

Initial Cancer Diagnosis

Date: [DATE]; Established by: Biopsy-proven

Detailed Pathology: Stage IV non-small cell lung cancer,
adenocarcinoma.

Primary Tumor location:  Right upper lobe.

Surgeries: No.

Chemotherapy: Yes; Ongoing? Yes; Most recent administration:
[DATE]

Immunotherapy?  Yes; Type: Keytruda; Ongoing? Yes

Radiation therapy? No

EXAM:
CT CHEST, ABDOMEN, AND PELVIS WITH CONTRAST
FINDINGS: CT CHEST FINDINGS

Cardiovascular: Aortic atherosclerosis. Tortuous thoracic aorta.
Normal heart size, without pericardial effusion. Lad coronary artery
calcification. Right Port-A-Cath tip mid right atrium. No central
pulmonary embolism, on this non-dedicated study.

Mediastinum/Nodes: No supraclavicular adenopathy. No mediastinal
adenopathy. Right hilar node of 1.0 cm is similar to 1.1 cm on the
prior, not pathologic by size criteria.

Lungs/Pleura: No pleural fluid.  Moderate centrilobular emphysema.

Posterior right upper lobe pleural-based lung lesion measures 2.5 x
2.3 Cm on 40/4. Compare 2.4 x 2.4 cm on the prior exam (when
remeasured).

A spiculated nodule along the right minor fissure measures 1.5 x
cm on 57/4. Compare similar on the prior exam (when remeasured).

Just central to this, nodularity along the more posterior minor
fissure measures 1.3 x 0.6 cm on 58/4 versus 1.2 x 0.8 cm on the
prior exam, suggesting stability.

Smaller nodules along the right minor fissure including on 100/4 are
unchanged on the order of 3 mm.

Left upper lobe ground-glass nodules, maximally 8 mm on 50/4.
Similar to on the prior exam (when remeasured).

Probable scarring within the posterior left apex on [DATE].

Musculoskeletal: Multiple remote right rib fractures.

CT ABDOMEN PELVIS FINDINGS

Hepatobiliary: Normal liver. Stones within a contracted gallbladder.
No acute cholecystitis or biliary duct dilatation.

Pancreas: Normal, without mass or ductal dilatation.

Spleen: Normal in size, without focal abnormality.

Adrenals/Urinary Tract: Normal adrenal glands. Left renal too small
to characterize lesions. Heterogeneous right greater than left renal
enhancement is most significant on delayed images, felt to be
similar. Normal urinary bladder.

Stomach/Bowel: Gastric cardia antral underdistention. Normal colon
and terminal ileum. Normal small bowel.

Vascular/Lymphatic: Aortic atherosclerosis. No abdominopelvic
adenopathy.

Reproductive: Central uterine body hyperenhancing 8 mm lesion is
likely a fibroid. No adnexal mass.

Other: No significant free fluid. Mild pelvic floor laxity. Ventral
abdominal wall hernias contain fat. No evidence of omental or
peritoneal disease.

Musculoskeletal: Mild disc bulge at L4-5.
IMPRESSION: 1. Similar right upper lobe/paraspinal lung lesion. Smaller nodules,
including a dominant inferior right upper lobe nodule contacting the
minor fissure are similar.
2. No thoracic adenopathy.
3. No findings of subdiaphragmatic metastatic disease.
4. Aortic atherosclerosis ([T4]-[T4]), coronary artery
atherosclerosis and emphysema ([T4]-[T4]).
5. Uterine fibroid.
6. Similar heterogeneous renal enhancement, worse on the right.
Suspicious for pyelonephritis.
7. Cholelithiasis.

## 2020-02-08 MED ORDER — IOHEXOL 300 MG/ML  SOLN
100.0000 mL | Freq: Once | INTRAMUSCULAR | Status: AC | PRN
  Administered 2020-02-08: 100 mL via INTRAVENOUS

## 2020-02-13 ENCOUNTER — Inpatient Hospital Stay: Payer: Medicaid Other

## 2020-02-13 ENCOUNTER — Inpatient Hospital Stay: Payer: Medicaid Other | Admitting: Internal Medicine

## 2020-02-14 ENCOUNTER — Inpatient Hospital Stay (HOSPITAL_BASED_OUTPATIENT_CLINIC_OR_DEPARTMENT_OTHER): Payer: Medicaid Other | Admitting: Internal Medicine

## 2020-02-14 ENCOUNTER — Other Ambulatory Visit: Payer: Self-pay

## 2020-02-14 ENCOUNTER — Inpatient Hospital Stay: Payer: Medicaid Other

## 2020-02-14 ENCOUNTER — Encounter: Payer: Self-pay | Admitting: Internal Medicine

## 2020-02-14 VITALS — BP 130/85 | HR 94 | Temp 97.9°F | Resp 17 | Ht 66.0 in | Wt 112.2 lb

## 2020-02-14 DIAGNOSIS — Z5112 Encounter for antineoplastic immunotherapy: Secondary | ICD-10-CM | POA: Diagnosis not present

## 2020-02-14 DIAGNOSIS — C3491 Malignant neoplasm of unspecified part of right bronchus or lung: Secondary | ICD-10-CM

## 2020-02-14 DIAGNOSIS — Z5111 Encounter for antineoplastic chemotherapy: Secondary | ICD-10-CM

## 2020-02-14 DIAGNOSIS — D709 Neutropenia, unspecified: Secondary | ICD-10-CM

## 2020-02-14 LAB — CMP (CANCER CENTER ONLY)
ALT: 28 U/L (ref 0–44)
AST: 39 U/L (ref 15–41)
Albumin: 2.9 g/dL — ABNORMAL LOW (ref 3.5–5.0)
Alkaline Phosphatase: 207 U/L — ABNORMAL HIGH (ref 38–126)
Anion gap: 10 (ref 5–15)
BUN: 5 mg/dL — ABNORMAL LOW (ref 6–20)
CO2: 20 mmol/L — ABNORMAL LOW (ref 22–32)
Calcium: 8.4 mg/dL — ABNORMAL LOW (ref 8.9–10.3)
Chloride: 108 mmol/L (ref 98–111)
Creatinine: 0.95 mg/dL (ref 0.44–1.00)
GFR, Estimated: >60 mL/min (ref >60)
Glucose, Bld: 96 mg/dL (ref 70–99)
Potassium: 3.3 mmol/L — ABNORMAL LOW (ref 3.5–5.1)
Sodium: 138 mmol/L (ref 135–145)
Total Bilirubin: 0.3 mg/dL (ref 0.3–1.2)
Total Protein: 7.2 g/dL (ref 6.5–8.1)

## 2020-02-14 LAB — CBC WITH DIFFERENTIAL (CANCER CENTER ONLY)
Abs Immature Granulocytes: 0.01 10*3/uL (ref 0.00–0.07)
Basophils Absolute: 0 10*3/uL (ref 0.0–0.1)
Basophils Relative: 1 %
Eosinophils Absolute: 0 10*3/uL (ref 0.0–0.5)
Eosinophils Relative: 0 %
HCT: 29.4 % — ABNORMAL LOW (ref 36.0–46.0)
Hemoglobin: 9.8 g/dL — ABNORMAL LOW (ref 12.0–15.0)
Immature Granulocytes: 0 %
Lymphocytes Relative: 43 %
Lymphs Abs: 1.6 10*3/uL (ref 0.7–4.0)
MCH: 36.4 pg — ABNORMAL HIGH (ref 26.0–34.0)
MCHC: 33.3 g/dL (ref 30.0–36.0)
MCV: 109.3 fL — ABNORMAL HIGH (ref 80.0–100.0)
Monocytes Absolute: 0.5 10*3/uL (ref 0.1–1.0)
Monocytes Relative: 12 %
Neutro Abs: 1.6 10*3/uL — ABNORMAL LOW (ref 1.7–7.7)
Neutrophils Relative %: 44 %
Platelet Count: 287 10*3/uL (ref 150–400)
RBC: 2.69 MIL/uL — ABNORMAL LOW (ref 3.87–5.11)
RDW: 17.4 % — ABNORMAL HIGH (ref 11.5–15.5)
WBC Count: 3.7 10*3/uL — ABNORMAL LOW (ref 4.0–10.5)
nRBC: 0 % (ref 0.0–0.2)

## 2020-02-14 LAB — TSH: TSH: 2.617 u[IU]/mL (ref 0.308–3.960)

## 2020-02-14 MED ORDER — SODIUM CHLORIDE 0.9% FLUSH
10.0000 mL | INTRAVENOUS | Status: DC | PRN
  Administered 2020-02-14: 10 mL
  Filled 2020-02-14: qty 10

## 2020-02-14 MED ORDER — PROCHLORPERAZINE MALEATE 10 MG PO TABS
10.0000 mg | ORAL_TABLET | Freq: Once | ORAL | Status: AC
  Administered 2020-02-14: 10 mg via ORAL

## 2020-02-14 MED ORDER — PROCHLORPERAZINE MALEATE 10 MG PO TABS
ORAL_TABLET | ORAL | Status: AC
  Filled 2020-02-14: qty 1

## 2020-02-14 MED ORDER — SODIUM CHLORIDE 0.9 % IV SOLN
200.0000 mg | Freq: Once | INTRAVENOUS | Status: AC
  Administered 2020-02-14: 200 mg via INTRAVENOUS
  Filled 2020-02-14: qty 8

## 2020-02-14 MED ORDER — SODIUM CHLORIDE 0.9% FLUSH
10.0000 mL | Freq: Once | INTRAVENOUS | Status: AC
  Administered 2020-02-14: 10 mL
  Filled 2020-02-14: qty 10

## 2020-02-14 MED ORDER — SODIUM CHLORIDE 0.9 % IV SOLN
Freq: Once | INTRAVENOUS | Status: AC
  Filled 2020-02-14: qty 250

## 2020-02-14 MED ORDER — SODIUM CHLORIDE 0.9 % IV SOLN
500.0000 mg/m2 | Freq: Once | INTRAVENOUS | Status: AC
  Administered 2020-02-14: 800 mg via INTRAVENOUS
  Filled 2020-02-14: qty 20

## 2020-02-14 MED ORDER — HEPARIN SOD (PORK) LOCK FLUSH 100 UNIT/ML IV SOLN
500.0000 [IU] | Freq: Once | INTRAVENOUS | Status: AC | PRN
  Administered 2020-02-14: 500 [IU]
  Filled 2020-02-14: qty 5

## 2020-02-14 NOTE — Progress Notes (Signed)
Brenda [name] [phone]   [phone]  OFFICE PROGRESS NOTE  Pcp, No No address on file  DIAGNOSIS: Stage IV (T2 a, N2, M1 B) non-small cell lung cancer, adenocarcinoma presented with large right upper lobe lung mass in addition to suspicious right paratracheal lymphadenopathy and bilateral pulmonary nodules as well as retroperitoneal lymphadenopathy diagnosed in May 2021. The patient has no actionable mutations and has negative PD-L1 expression on molecular studies by foundation [facility].  PRIOR THERAPY: None  CURRENT THERAPY: Systemic chemotherapy with carboplatin for an AUC of 5, Alimta 500 mg/m2, and Keytruda 200 mg IV every 3 weeks. First dose 09/28/2019. Status post 6 cycles. Starting from cycle #5 the patient is on maintenance treatment with Alimta and Keytruda every 3 weeks.  INTERVAL HISTORY: Brenda [name] 59 y.o. female returns to the clinic today for follow-up visit.  The patient is feeling fine today with no concerning complaints.  She denied having any chest pain, shortness of breath, cough or hemoptysis.  She denied having any nausea, vomiting, diarrhea or constipation.  She denied having any headache or visual changes.  She has been tolerating her treatment with maintenance Alimta and Keytruda fairly well.  The patient had repeat CT scan of the chest, abdomen pelvis performed recently and she is here for evaluation and discussion of her scan results.  MEDICAL HISTORY: Past Medical History:  Diagnosis Date  . Breast cancer ([place])   . Depression   . Mental disorder     ALLERGIES:  is allergic to aspirin adult low [aspirin].  MEDICATIONS:  Current Outpatient Medications  Medication Sig Dispense Refill  . calcium carbonate (OSCAL) 1500 (600 Ca) MG TABS tablet Take 600 mg by mouth daily.    . folic acid (FOLVITE) 1 MG tablet Take 1 tablet (1 mg total) by mouth daily. 30 tablet 4  . lidocaine-prilocaine (EMLA) cream Apply 1 application  topically as needed. 30 g 2  . potassium chloride SA (KLOR-CON) 20 MEQ tablet Take 1 tablet (20 mEq total) by mouth daily. 10 tablet 0  . prochlorperazine (COMPAZINE) 10 MG tablet Take 1 tablet (10 mg total) by mouth every 6 (six) hours as needed for nausea or vomiting. 30 tablet 3   No current facility-administered medications for this visit.    SURGICAL HISTORY:  Past Surgical History:  Procedure Laterality Date  . IR IMAGING GUIDED PORT INSERTION  11/03/2019  . TUBAL LIGATION    . VIDEO BRONCHOSCOPY WITH ENDOBRONCHIAL NAVIGATION N/A 04/04/2019   Procedure: VIDEO BRONCHOSCOPY WITH ENDOBRONCHIAL NAVIGATION;  Surgeon: Garner Nash, DO;  Location: [facility];  Service: Thoracic;  Laterality: N/A;  . VIDEO BRONCHOSCOPY WITH ENDOBRONCHIAL ULTRASOUND N/A 04/04/2019   Procedure: VIDEO BRONCHOSCOPY WITH ENDOBRONCHIAL ULTRASOUND;  Surgeon: Garner Nash, DO;  Location: [facility];  Service: Thoracic;  Laterality: N/A;    REVIEW OF SYSTEMS:  Constitutional: positive for fatigue Eyes: negative Ears, nose, mouth, throat, and face: negative Respiratory: negative Cardiovascular: negative Gastrointestinal: negative Genitourinary:negative Integument/breast: negative Hematologic/lymphatic: negative Musculoskeletal:negative Neurological: negative Behavioral/Psych: negative Endocrine: negative Allergic/Immunologic: negative   PHYSICAL EXAMINATION: General appearance: alert, cooperative, fatigued and no distress Head: Normocephalic, without obvious abnormality, atraumatic Neck: no adenopathy, no JVD, supple, symmetrical, trachea midline and thyroid not enlarged, symmetric, no tenderness/mass/nodules Lymph nodes: Cervical, supraclavicular, and axillary nodes normal. Resp: clear to auscultation bilaterally Back: symmetric, no curvature. ROM normal. No CVA tenderness. Cardio: regular rate and rhythm, S1, S2 normal, no murmur, click, rub or gallop GI: soft, non-tender;  bowel sounds normal; no masses,  no  organomegaly Extremities: extremities normal, atraumatic, no cyanosis or edema Neurologic: Alert and oriented X 3, normal strength and tone. Normal symmetric reflexes. Normal coordination and gait  ECOG PERFORMANCE STATUS: 1 - Symptomatic but completely ambulatory  Blood pressure 130/85, pulse 94, temperature 97.9 F (36.6 C), temperature source Tympanic, resp. rate 17, height 5' 6"  (1.676 m), weight 112 lb 3.2 oz (50.9 kg), last menstrual period 01/19/2011, SpO2 100 %.  LABORATORY DATA: Lab Results  Component Value Date   WBC 3.7 (L) 02/14/2020   HGB 9.8 (L) 02/14/2020   HCT 29.4 (L) 02/14/2020   MCV 109.3 (H) 02/14/2020   PLT 287 02/14/2020      Chemistry      Component Value Date/Time   NA 141 01/26/2020 1238   K 3.5 01/26/2020 1238   CL 107 01/26/2020 1238   CO2 27 01/26/2020 1238   BUN <4 (L) 01/26/2020 1238   CREATININE 0.74 01/26/2020 1238      Component Value Date/Time   CALCIUM 8.8 (L) 01/26/2020 1238   ALKPHOS 181 (H) 01/26/2020 1238   AST 44 (H) 01/26/2020 1238   ALT 37 01/26/2020 1238   BILITOT 0.3 01/26/2020 1238       RADIOGRAPHIC STUDIES: CT Chest W Contrast  Result Date: 02/09/2020 CLINICAL DATA:  Primary Cancer Type: Lung Imaging Indication: Assess response to therapy Interval therapy since last imaging? Yes Initial Cancer Diagnosis Date: 08/10/2019; Established by: Biopsy-proven Detailed Pathology: Stage IV non-small cell lung cancer, adenocarcinoma. Primary Tumor location:  Right upper lobe. Surgeries: No. Chemotherapy: Yes; Ongoing? Yes; Most recent administration: 12/27/2019 Immunotherapy?  Yes; Type: Keytruda; Ongoing? Yes Radiation therapy? No EXAM: CT CHEST, ABDOMEN, AND PELVIS WITH CONTRAST TECHNIQUE: Multidetector CT imaging of the chest, abdomen and pelvis was performed following the standard protocol during bolus administration of intravenous contrast. CONTRAST:  122m OMNIPAQUE IOHEXOL 300 MG/ML  SOLN COMPARISON:  Most recent CT chest, abdomen  and pelvis 11/30/2019. 09/01/2019 PET-CT. FINDINGS: CT CHEST FINDINGS Cardiovascular: Aortic atherosclerosis. Tortuous thoracic aorta. Normal heart size, without pericardial effusion. Lad coronary artery calcification. Right Port-A-Cath tip mid right atrium. No central pulmonary embolism, on this non-dedicated study. Mediastinum/Nodes: No supraclavicular adenopathy. No mediastinal adenopathy. Right hilar node of 1.0 cm is similar to 1.1 cm on the prior, not pathologic by size criteria. Lungs/Pleura: No pleural fluid.  Moderate centrilobular emphysema. Posterior right upper lobe pleural-based lung lesion measures 2.5 x 2.3 Cm on 40/4. Compare 2.4 x 2.4 cm on the prior exam (when remeasured). A spiculated nodule along the right minor fissure measures 1.5 x 1.5 cm on 57/4. Compare similar on the prior exam (when remeasured). Just central to this, nodularity along the more posterior minor fissure measures 1.3 x 0.6 cm on 58/4 versus 1.2 x 0.8 cm on the prior exam, suggesting stability. Smaller nodules along the right minor fissure including on 100/4 are unchanged on the order of 3 mm. Left upper lobe ground-glass nodules, maximally 8 mm on 50/4. Similar to on the prior exam (when remeasured). Probable scarring within the posterior left apex on 30/4. Musculoskeletal: Multiple remote right rib fractures. CT ABDOMEN PELVIS FINDINGS Hepatobiliary: Normal liver. Stones within a contracted gallbladder. No acute cholecystitis or biliary duct dilatation. Pancreas: Normal, without mass or ductal dilatation. Spleen: Normal in size, without focal abnormality. Adrenals/Urinary Tract: Normal adrenal glands. Left renal too small to characterize lesions. Heterogeneous right greater than left renal enhancement is most significant on delayed images, felt to be similar. Normal  urinary bladder. Stomach/Bowel: Gastric cardia antral underdistention. Normal colon and terminal ileum. Normal small bowel. Vascular/Lymphatic: Aortic  atherosclerosis. No abdominopelvic adenopathy. Reproductive: Central uterine body hyperenhancing 8 mm lesion is likely a fibroid. No adnexal mass. Other: No significant free fluid. Mild pelvic floor laxity. Ventral abdominal wall hernias contain fat. No evidence of omental or peritoneal disease. Musculoskeletal: Mild disc bulge at L4-5. IMPRESSION: 1. Similar right upper lobe/paraspinal lung lesion. Smaller nodules, including a dominant inferior right upper lobe nodule contacting the minor fissure are similar. 2. No thoracic adenopathy. 3. No findings of subdiaphragmatic metastatic disease. 4. Aortic atherosclerosis (ICD10-I70.0), coronary artery atherosclerosis and emphysema (ICD10-J43.9). 5. Uterine fibroid. 6. Similar heterogeneous renal enhancement, worse on the right. Suspicious for pyelonephritis. 7. Cholelithiasis. Electronically Signed   By: Abigail Miyamoto M.D.   On: 02/09/2020 10:38   CT Abdomen Pelvis W Contrast  Result Date: 02/09/2020 CLINICAL DATA:  Primary Cancer Type: Lung Imaging Indication: Assess response to therapy Interval therapy since last imaging? Yes Initial Cancer Diagnosis Date: 08/10/2019; Established by: Biopsy-proven Detailed Pathology: Stage IV non-small cell lung cancer, adenocarcinoma. Primary Tumor location:  Right upper lobe. Surgeries: No. Chemotherapy: Yes; Ongoing? Yes; Most recent administration: 12/27/2019 Immunotherapy?  Yes; Type: Keytruda; Ongoing? Yes Radiation therapy? No EXAM: CT CHEST, ABDOMEN, AND PELVIS WITH CONTRAST TECHNIQUE: Multidetector CT imaging of the chest, abdomen and pelvis was performed following the standard protocol during bolus administration of intravenous contrast. CONTRAST:  177m OMNIPAQUE IOHEXOL 300 MG/ML  SOLN COMPARISON:  Most recent CT chest, abdomen and pelvis 11/30/2019. 09/01/2019 PET-CT. FINDINGS: CT CHEST FINDINGS Cardiovascular: Aortic atherosclerosis. Tortuous thoracic aorta. Normal heart size, without pericardial effusion. Lad  coronary artery calcification. Right Port-A-Cath tip mid right atrium. No central pulmonary embolism, on this non-dedicated study. Mediastinum/Nodes: No supraclavicular adenopathy. No mediastinal adenopathy. Right hilar node of 1.0 cm is similar to 1.1 cm on the prior, not pathologic by size criteria. Lungs/Pleura: No pleural fluid.  Moderate centrilobular emphysema. Posterior right upper lobe pleural-based lung lesion measures 2.5 x 2.3 Cm on 40/4. Compare 2.4 x 2.4 cm on the prior exam (when remeasured). A spiculated nodule along the right minor fissure measures 1.5 x 1.5 cm on 57/4. Compare similar on the prior exam (when remeasured). Just central to this, nodularity along the more posterior minor fissure measures 1.3 x 0.6 cm on 58/4 versus 1.2 x 0.8 cm on the prior exam, suggesting stability. Smaller nodules along the right minor fissure including on 100/4 are unchanged on the order of 3 mm. Left upper lobe ground-glass nodules, maximally 8 mm on 50/4. Similar to on the prior exam (when remeasured). Probable scarring within the posterior left apex on 30/4. Musculoskeletal: Multiple remote right rib fractures. CT ABDOMEN PELVIS FINDINGS Hepatobiliary: Normal liver. Stones within a contracted gallbladder. No acute cholecystitis or biliary duct dilatation. Pancreas: Normal, without mass or ductal dilatation. Spleen: Normal in size, without focal abnormality. Adrenals/Urinary Tract: Normal adrenal glands. Left renal too small to characterize lesions. Heterogeneous right greater than left renal enhancement is most significant on delayed images, felt to be similar. Normal urinary bladder. Stomach/Bowel: Gastric cardia antral underdistention. Normal colon and terminal ileum. Normal small bowel. Vascular/Lymphatic: Aortic atherosclerosis. No abdominopelvic adenopathy. Reproductive: Central uterine body hyperenhancing 8 mm lesion is likely a fibroid. No adnexal mass. Other: No significant free fluid. Mild pelvic floor  laxity. Ventral abdominal wall hernias contain fat. No evidence of omental or peritoneal disease. Musculoskeletal: Mild disc bulge at L4-5. IMPRESSION: 1. Similar right upper lobe/paraspinal lung lesion. Smaller  nodules, including a dominant inferior right upper lobe nodule contacting the minor fissure are similar. 2. No thoracic adenopathy. 3. No findings of subdiaphragmatic metastatic disease. 4. Aortic atherosclerosis (ICD10-I70.0), coronary artery atherosclerosis and emphysema (ICD10-J43.9). 5. Uterine fibroid. 6. Similar heterogeneous renal enhancement, worse on the right. Suspicious for pyelonephritis. 7. Cholelithiasis. Electronically Signed   By: Abigail Miyamoto M.D.   On: 02/09/2020 10:38    ASSESSMENT AND PLAN: This is a very pleasant 59 years old African-American female with a stage IV non-small cell lung cancer, adenocarcinoma presented with large right upper lobe lung mass in addition to suspicious right paratracheal lymphadenopathy and bilateral pulmonary nodules as well as retroperitoneal lymphadenopathy diagnosed in May 2021 with no actionable mutations and negative PD-L1 expression. The patient is currently undergoing systemic chemotherapy with carboplatin for AUC of 5, Alimta 500 mg/M2 and Keytruda 200 mg IV every 3 weeks status post 6 cycles.  Starting from cycle #5 the patient is on treatment with maintenance therapy with Alimta and Keytruda every 3 weeks.   The patient continues to tolerate this treatment well with no concerning adverse effects. She had repeat CT scan of the chest, abdomen pelvis performed recently.  I personally and independently reviewed the scans and discussed the results with the patient today. Her scan showed no concerning findings for disease progression. I recommended for the patient to continue her current treatment with maintenance Alimta and Keytruda and she will proceed with cycle #7 today. She will come back for follow-up visit in 3 weeks for evaluation before  the next cycle of her treatment. The patient was advised to call immediately if she has any concerning symptoms in the interval.  The patient voices understanding of current disease status and treatment options and is in agreement with the current care plan.  All questions were answered. The patient knows to call the clinic with any problems, questions or concerns. We can certainly see the patient much sooner if necessary.   Disclaimer: This note was dictated with voice recognition software. Similar sounding words can inadvertently be transcribed and may not be corrected upon review.

## 2020-02-14 NOTE — Patient Instructions (Signed)
[facility] Discharge Instructions for Patients Receiving Chemotherapy  Today you received the following chemotherapy agents: pembrolizumab/pemetrexed.  To help prevent nausea and vomiting after your treatment, we encourage you to take your nausea medication as directed.   If you develop nausea and vomiting that is not controlled by your nausea medication, call the clinic.   BELOW ARE SYMPTOMS THAT SHOULD BE REPORTED IMMEDIATELY:  *FEVER GREATER THAN 100.5 F  *CHILLS WITH OR WITHOUT FEVER  NAUSEA AND VOMITING THAT IS NOT CONTROLLED WITH YOUR NAUSEA MEDICATION  *UNUSUAL SHORTNESS OF BREATH  *UNUSUAL BRUISING OR BLEEDING  TENDERNESS IN MOUTH AND THROAT WITH OR WITHOUT PRESENCE OF ULCERS  *URINARY PROBLEMS  *BOWEL PROBLEMS  UNUSUAL RASH Items with * indicate a potential emergency and should be followed up as soon as possible.  Feel free to call the clinic should you have any questions or concerns. The clinic phone number is [phone].  Please show the [facility] at check-in to the Emergency Department and triage nurse.

## 2020-02-20 ENCOUNTER — Telehealth: Payer: Self-pay | Admitting: Medical Oncology

## 2020-02-20 NOTE — Telephone Encounter (Signed)
Abdominal pain/diarrhea   lower abdominal pain , belly button area. Described as intermittient and sharp Started yesterday , 6 days after tx. , has watery diarrhea ,taking pepto . "running a low grade fever" She said her temp was 98 F.  Schedule message sent for [facility] tomorrow.

## 2020-02-21 ENCOUNTER — Inpatient Hospital Stay: Payer: Medicaid Other | Admitting: Medical

## 2020-02-21 ENCOUNTER — Inpatient Hospital Stay: Payer: Medicaid Other

## 2020-02-22 ENCOUNTER — Inpatient Hospital Stay (HOSPITAL_BASED_OUTPATIENT_CLINIC_OR_DEPARTMENT_OTHER): Payer: Medicaid Other | Admitting: Medical

## 2020-02-22 ENCOUNTER — Inpatient Hospital Stay: Payer: Medicaid Other | Attending: Internal Medicine

## 2020-02-22 ENCOUNTER — Inpatient Hospital Stay: Payer: Medicaid Other

## 2020-02-22 ENCOUNTER — Other Ambulatory Visit: Payer: Self-pay

## 2020-02-22 VITALS — BP 127/80 | HR 114 | Temp 98.9°F | Resp 14 | Ht 66.0 in | Wt 112.4 lb

## 2020-02-22 DIAGNOSIS — D701 Agranulocytosis secondary to cancer chemotherapy: Secondary | ICD-10-CM | POA: Diagnosis not present

## 2020-02-22 DIAGNOSIS — I2510 Atherosclerotic heart disease of native coronary artery without angina pectoris: Secondary | ICD-10-CM | POA: Diagnosis not present

## 2020-02-22 DIAGNOSIS — R197 Diarrhea, unspecified: Secondary | ICD-10-CM | POA: Diagnosis not present

## 2020-02-22 DIAGNOSIS — R918 Other nonspecific abnormal finding of lung field: Secondary | ICD-10-CM

## 2020-02-22 DIAGNOSIS — D709 Neutropenia, unspecified: Secondary | ICD-10-CM

## 2020-02-22 DIAGNOSIS — C3411 Malignant neoplasm of upper lobe, right bronchus or lung: Secondary | ICD-10-CM | POA: Diagnosis present

## 2020-02-22 DIAGNOSIS — I700 Atherosclerosis of aorta: Secondary | ICD-10-CM | POA: Diagnosis not present

## 2020-02-22 DIAGNOSIS — J439 Emphysema, unspecified: Secondary | ICD-10-CM | POA: Insufficient documentation

## 2020-02-22 DIAGNOSIS — Z886 Allergy status to analgesic agent status: Secondary | ICD-10-CM | POA: Diagnosis not present

## 2020-02-22 DIAGNOSIS — Z853 Personal history of malignant neoplasm of breast: Secondary | ICD-10-CM | POA: Diagnosis not present

## 2020-02-22 DIAGNOSIS — R1030 Lower abdominal pain, unspecified: Secondary | ICD-10-CM | POA: Diagnosis not present

## 2020-02-22 DIAGNOSIS — T451X5A Adverse effect of antineoplastic and immunosuppressive drugs, initial encounter: Secondary | ICD-10-CM | POA: Insufficient documentation

## 2020-02-22 DIAGNOSIS — Z87891 Personal history of nicotine dependence: Secondary | ICD-10-CM | POA: Diagnosis not present

## 2020-02-22 DIAGNOSIS — Z5111 Encounter for antineoplastic chemotherapy: Secondary | ICD-10-CM | POA: Insufficient documentation

## 2020-02-22 DIAGNOSIS — C3491 Malignant neoplasm of unspecified part of right bronchus or lung: Secondary | ICD-10-CM

## 2020-02-22 DIAGNOSIS — D259 Leiomyoma of uterus, unspecified: Secondary | ICD-10-CM | POA: Insufficient documentation

## 2020-02-22 DIAGNOSIS — E876 Hypokalemia: Secondary | ICD-10-CM | POA: Diagnosis not present

## 2020-02-22 DIAGNOSIS — K8020 Calculus of gallbladder without cholecystitis without obstruction: Secondary | ICD-10-CM | POA: Insufficient documentation

## 2020-02-22 DIAGNOSIS — Z79899 Other long term (current) drug therapy: Secondary | ICD-10-CM | POA: Insufficient documentation

## 2020-02-22 LAB — CBC WITH DIFFERENTIAL (CANCER CENTER ONLY)
Abs Immature Granulocytes: 0.08 10*3/uL — ABNORMAL HIGH (ref 0.00–0.07)
Basophils Absolute: 0 10*3/uL (ref 0.0–0.1)
Basophils Relative: 1 %
Eosinophils Absolute: 0 10*3/uL (ref 0.0–0.5)
Eosinophils Relative: 0 %
HCT: 24.7 % — ABNORMAL LOW (ref 36.0–46.0)
Hemoglobin: 8.6 g/dL — ABNORMAL LOW (ref 12.0–15.0)
Immature Granulocytes: 5 %
Lymphocytes Relative: 60 %
Lymphs Abs: 0.9 10*3/uL (ref 0.7–4.0)
MCH: 36.8 pg — ABNORMAL HIGH (ref 26.0–34.0)
MCHC: 34.8 g/dL (ref 30.0–36.0)
MCV: 105.6 fL — ABNORMAL HIGH (ref 80.0–100.0)
Monocytes Absolute: 0.2 10*3/uL (ref 0.1–1.0)
Monocytes Relative: 11 %
Neutro Abs: 0.3 10*3/uL — CL (ref 1.7–7.7)
Neutrophils Relative %: 23 %
Platelet Count: 92 10*3/uL — ABNORMAL LOW (ref 150–400)
RBC: 2.34 MIL/uL — ABNORMAL LOW (ref 3.87–5.11)
RDW: 15.4 % (ref 11.5–15.5)
WBC Count: 1.5 10*3/uL — ABNORMAL LOW (ref 4.0–10.5)
nRBC: 0 % (ref 0.0–0.2)

## 2020-02-22 LAB — CMP (CANCER CENTER ONLY)
ALT: 29 U/L (ref 0–44)
AST: 48 U/L — ABNORMAL HIGH (ref 15–41)
Albumin: 2.5 g/dL — ABNORMAL LOW (ref 3.5–5.0)
Alkaline Phosphatase: 169 U/L — ABNORMAL HIGH (ref 38–126)
Anion gap: 10 (ref 5–15)
BUN: 5 mg/dL — ABNORMAL LOW (ref 6–20)
CO2: 23 mmol/L (ref 22–32)
Calcium: 8.6 mg/dL — ABNORMAL LOW (ref 8.9–10.3)
Chloride: 100 mmol/L (ref 98–111)
Creatinine: 0.78 mg/dL (ref 0.44–1.00)
GFR, Estimated: >60 mL/min (ref >60)
Glucose, Bld: 95 mg/dL (ref 70–99)
Potassium: 2.9 mmol/L — ABNORMAL LOW (ref 3.5–5.1)
Sodium: 133 mmol/L — ABNORMAL LOW (ref 135–145)
Total Bilirubin: 0.5 mg/dL (ref 0.3–1.2)
Total Protein: 7.3 g/dL (ref 6.5–8.1)

## 2020-02-22 MED ORDER — POTASSIUM CHLORIDE CRYS ER 20 MEQ PO TBCR
40.0000 meq | EXTENDED_RELEASE_TABLET | Freq: Once | ORAL | Status: AC
  Administered 2020-02-22: 40 meq via ORAL

## 2020-02-22 MED ORDER — FILGRASTIM-SNDZ 300 MCG/0.5ML IJ SOSY
300.0000 ug | PREFILLED_SYRINGE | Freq: Once | INTRAMUSCULAR | Status: AC
  Administered 2020-02-22: 300 ug via SUBCUTANEOUS

## 2020-02-22 MED ORDER — POTASSIUM CHLORIDE CRYS ER 20 MEQ PO TBCR: 20.0000 meq | EXTENDED_RELEASE_TABLET | Freq: Every day | ORAL | 0 refills | Status: DC

## 2020-02-22 MED ORDER — POTASSIUM CHLORIDE CRYS ER 20 MEQ PO TBCR
EXTENDED_RELEASE_TABLET | ORAL | Status: AC
  Filled 2020-02-22: qty 2

## 2020-02-22 MED ORDER — SODIUM CHLORIDE 0.9% FLUSH
10.0000 mL | Freq: Once | INTRAVENOUS | Status: AC
  Administered 2020-02-22: 10 mL
  Filled 2020-02-22: qty 10

## 2020-02-22 MED ORDER — POTASSIUM CHLORIDE 20 MEQ PO PACK: 40.0000 meq | PACK | Freq: Once | ORAL | Status: DC

## 2020-02-22 MED ORDER — FILGRASTIM-SNDZ 300 MCG/0.5ML IJ SOSY: 300.0000 ug | PREFILLED_SYRINGE | Freq: Once | INTRAMUSCULAR | Status: DC

## 2020-02-22 MED ORDER — FILGRASTIM-SNDZ 300 MCG/0.5ML IJ SOSY
PREFILLED_SYRINGE | INTRAMUSCULAR | Status: AC
  Filled 2020-02-22: qty 0.5

## 2020-02-22 MED FILL — POTASSIUM CHLORIDE CRYS ER: 20 | 14 days supply | Qty: 14 | Fill #0

## 2020-02-22 NOTE — Progress Notes (Signed)
CRITICAL VALUE ALERT  Critical Value: ANC 0.3  Date & Time Notied: 02/22/20 2:57 PM per Verdis Frederickson (lab)  Provider Notified: Sandi Mealy, PA  Orders Received/Actions taken:

## 2020-02-22 NOTE — Progress Notes (Signed)
Patient assessed by Sandi Mealy, PA.

## 2020-02-22 NOTE — Patient Instructions (Signed)

## 2020-02-23 ENCOUNTER — Other Ambulatory Visit: Payer: Self-pay | Admitting: Medical

## 2020-02-23 ENCOUNTER — Other Ambulatory Visit: Payer: Self-pay

## 2020-02-23 ENCOUNTER — Inpatient Hospital Stay: Payer: Medicaid Other

## 2020-02-23 VITALS — BP 122/95 | HR 112 | Resp 16

## 2020-02-23 DIAGNOSIS — E876 Hypokalemia: Secondary | ICD-10-CM

## 2020-02-23 DIAGNOSIS — C3491 Malignant neoplasm of unspecified part of right bronchus or lung: Secondary | ICD-10-CM

## 2020-02-23 DIAGNOSIS — Z5111 Encounter for antineoplastic chemotherapy: Secondary | ICD-10-CM | POA: Diagnosis not present

## 2020-02-23 MED ORDER — CYANOCOBALAMIN 1000 MCG/ML IJ SOLN: 1000.0000 ug | Freq: Once | INTRAMUSCULAR | Status: DC

## 2020-02-23 MED ORDER — CYANOCOBALAMIN 1000 MCG/ML IJ SOLN
INTRAMUSCULAR | Status: AC
  Filled 2020-02-23: qty 1

## 2020-02-23 MED ORDER — CYANOCOBALAMIN 1000 MCG/ML IJ SOLN
1000.0000 ug | Freq: Once | INTRAMUSCULAR | Status: AC
  Administered 2020-02-23: 1000 ug via INTRAMUSCULAR

## 2020-02-23 NOTE — Patient Instructions (Signed)
Cyanocobalamin, Pyridoxine, and Folate What is this medicine? A multivitamin containing folic acid, vitamin B6, and vitamin B12. This medicine may be used for other purposes; ask your health care provider or pharmacist if you have questions. COMMON BRAND NAME(S): AllanFol RX, AllanTex, Av-Vite FB, B Complex with Folic Acid, ComBgen, FaBB, Folamin, Folastin, Folbalin, Folbee, Folbic, Folcaps, Folgard, Folgard RX, Folgard RX 2.2, Folplex, Folplex 2.2, Foltabs 800, Foltx, Homocysteine Formula, Niva-Fol, NuFol, TL Gard RX, Virt-Gard, Virt-Vite, Virt-Vite Forte, Vita-Respa What should I tell my health care provider before I take this medicine? They need to know if you have any of these conditions:  bleeding or clotting disorder  history of anemia of any type  other chronic health condition  an unusual or allergic reaction to vitamins, other medicines, foods, dyes, or preservatives  pregnant or trying to get pregnant  breast-feeding How should I use this medicine? Take by mouth with a glass of water. May take with food. Follow the directions on the prescription label. It is usually given once a day. Do not take your medicine more often than directed. Contact your pediatrician regarding the use of this medicine in children. Special care may be needed. Overdosage: If you think you have taken too much of this medicine contact a poison control center or emergency room at once. NOTE: This medicine is only for you. Do not share this medicine with others. What if I miss a dose? If you miss a dose, take it as soon as you can. If it is almost time for your next dose, take only that dose. Do not take double or extra doses. What may interact with this medicine?  levodopa This list may not describe all possible interactions. Give your health care provider a list of all the medicines, herbs, non-prescription drugs, or dietary supplements you use. Also tell them if you smoke, drink alcohol, or use illegal  drugs. Some items may interact with your medicine. What should I watch for while using this medicine? See your health care professional for regular checks on your progress. Remember that vitamin supplements do not replace the need for good nutrition from a balanced diet. What side effects may I notice from receiving this medicine? Side effects that you should report to your doctor or health care professional as soon as possible:  allergic reaction such as skin rash or difficulty breathing  vomiting Side effects that usually do not require medical attention (report to your doctor or health care professional if they continue or are bothersome):  nausea  stomach upset This list may not describe all possible side effects. Call your doctor for medical advice about side effects. You may report side effects to FDA at 1-800-FDA-1088. Where should I keep my medicine? Keep out of the reach of children. Most vitamins should be stored at controlled room temperature. Check your specific product directions. Protect from heat and moisture. Throw away any unused medicine after the expiration date. NOTE: This sheet is a summary. It may not cover all possible information. If you have questions about this medicine, talk to your doctor, pharmacist, or health care provider.  2020 Elsevier/Gold Standard (2007-04-30 00:59:55)  

## 2020-02-26 NOTE — Progress Notes (Signed)
[facility] Progress Note   Brenda [name] [id number] [birth year] 59 y.o.  Brenda [name] is managed by Dr. Fanny Bien. Brenda [name]  Actively treated with chemotherapy/immunotherapy/hormonal therapy: yes  Current therapy: Alimta and Keytruda with Udenyca support  Last treated: 02/14/2020 (cycle 7, day 1)  Next scheduled appointment with provider: 03/06/2020  Assessment: Plan:    Hypokalemia - Plan: potassium chloride SA (KLOR-CON) CR tablet 40 mEq, DISCONTINUED: potassium chloride (KLOR-CON) packet 40 mEq  Chemotherapy-induced neutropenia (HCC) - Plan: filgrastim-sndz (ZARXIO) injection 300 mcg, DISCONTINUED: filgrastim-sndz (ZARXIO) injection 300 mcg  Adenocarcinoma of right lung, stage 4 (HCC)   Hypokalemia: The patient's potassium`Level returned at 2.9 today.  She was given potassium 40 mEq p.o. x1 while here and was given a prescription for potassium 20 mEq once daily for 14 days.  Neutropenia: The patient's labs returned today with a WBC of 1.5 and an ANC of 0.3.  She was given Zarxio 300 mcg subcu today and will return tomorrow for an additional dose.  Metastatic adenocarcinoma of the right lung: Brenda [name] is managed by Dr. Fanny Bien. Brenda [name] and is status post cycle 7, day 1 of Alimta and Keytruda with Udenyca support.  She is scheduled to be seen in follow-up on 03/06/2020.  Please see After Visit Summary for patient specific instructions.  Future Appointments  Date Time Provider [place]  03/06/2020  8:30 AM [facility] None  03/06/2020  8:45 AM [facility] [place] FLUSH [facility] None  03/06/2020  9:15 AM Curt Bears, MD [facility] None  03/06/2020 10:00 AM [facility] INFUSION [facility] None  03/27/2020 11:00 AM [facility] None  03/27/2020 11:15 AM [facility] [place] FLUSH [facility] None  03/27/2020 12:00 PM Curt Bears, MD [facility] None  03/27/2020  1:00 PM [facility] INFUSION [facility] None    No orders  of the defined types were placed in this encounter.      Subjective:   Patient ID:  Brenda [name] is a 59 y.o. (DOB [birth year]) female.  Chief Complaint: No chief complaint on file.   HPI Brenda [name]  is a 59 y.o. female with a diagnosis of a metastatic adenocarcinoma of the right lung.  She is followed by Dr. Fanny Bien. Brenda [name] and is status post cycle 7, day 1 of Alimta and Keytruda with Udenyca support.  She presents to the clinic today with report that she was having lower abdominal pain and diarrhea after eating greens at Thanksgiving dinner.  She reports that she is feeling much better.  Her diarrhea has resolved.  Her abdominal pain has abated.  She denies fevers, chills, sweats, nausea, vomiting, constipation, or diarrhea.  Medications: I have reviewed the patient's current medications.  Allergies:  Allergies  Allergen Reactions  . Aspirin Adult Low [Aspirin]     Stomach upset    Past Medical History:  Diagnosis Date  . Breast cancer ([place])   . Depression   . Mental disorder     Past Surgical History:  Procedure Laterality Date  . IR IMAGING GUIDED PORT INSERTION  11/03/2019  . TUBAL LIGATION    . VIDEO BRONCHOSCOPY WITH ENDOBRONCHIAL NAVIGATION N/A 04/04/2019   Procedure: VIDEO BRONCHOSCOPY WITH ENDOBRONCHIAL NAVIGATION;  Surgeon: Garner Nash, DO;  Location: [facility];  Service: Thoracic;  Laterality: N/A;  . VIDEO BRONCHOSCOPY WITH ENDOBRONCHIAL ULTRASOUND N/A 04/04/2019   Procedure: VIDEO BRONCHOSCOPY WITH ENDOBRONCHIAL ULTRASOUND;  Surgeon: Garner Nash, DO;  Location: [facility];  Service: Thoracic;  Laterality: N/A;    No family history on  file.  Social History   Socioeconomic History  . Marital status: Legally Separated    Spouse name: Not on file  . Number of children: Not on file  . Years of education: Not on file  . Highest education level: Not on file  Occupational History  . Not on file  Tobacco Use  . Smoking status: Former Smoker     Packs/day: 0.50    Years: 30.00    Pack years: 15.00    Types: Cigarettes    Quit date: 05/27/2019    Years since quitting: 0.7  . Smokeless tobacco: Never Used  Vaping Use  . Vaping Use: Never used  Substance and Sexual Activity  . Alcohol use: Yes  . Drug use: Never  . Sexual activity: Not on file  Other Topics Concern  . Not on file  Social History Narrative   ** Merged History Encounter **       Social Determinants of Health   Financial Resource Strain:   . Difficulty of Paying Living Expenses: Not on file  Food Insecurity:   . Worried About Charity fundraiser in the Last Year: Not on file  . Ran Out of Food in the Last Year: Not on file  Transportation Needs:   . Lack of Transportation (Medical): Not on file  . Lack of Transportation (Non-Medical): Not on file  Physical Activity:   . Days of Exercise per Week: Not on file  . Minutes of Exercise per Session: Not on file  Stress:   . Feeling of Stress : Not on file  Social Connections:   . Frequency of Communication with Friends and Family: Not on file  . Frequency of Social Gatherings with Friends and Family: Not on file  . Attends Religious Services: Not on file  . Active Member of Clubs or Organizations: Not on file  . Attends Archivist Meetings: Not on file  . Marital Status: Not on file  Intimate Partner Violence:   . Fear of Current or Ex-Partner: Not on file  . Emotionally Abused: Not on file  . Physically Abused: Not on file  . Sexually Abused: Not on file    Past Medical History, Surgical history, Social history, and Family history were reviewed and updated as appropriate.   Please see review of systems for further details on the patient's review from today.   Review of Systems:  Review of Systems  Constitutional: Negative for activity change, appetite change, chills, diaphoresis and fever.  HENT: Negative for trouble swallowing.   Respiratory: Negative for cough, chest tightness and  shortness of breath.   Cardiovascular: Negative for chest pain, palpitations and leg swelling.  Gastrointestinal: Negative for abdominal distention, abdominal pain, blood in stool, constipation, diarrhea, nausea and vomiting.  Genitourinary: Negative for decreased urine volume and difficulty urinating.  Neurological: Negative for weakness.    Objective:   Physical Exam:  BP 127/80 (BP Location: Left Arm, Patient Position: Sitting)   Pulse (!) 114   Temp 98.9 F (37.2 C) (Tympanic)   Resp 14   Ht 5\' 6"  (1.676 m)   Wt 112 lb 6.4 oz (51 kg)   LMP 01/19/2011   SpO2 100%   BMI 18.14 kg/m  ECOG: 0  Physical Exam Constitutional:      General: She is not in acute distress.    Appearance: She is not diaphoretic.  HENT:     Head: Normocephalic and atraumatic.     Mouth/Throat:     Pharynx:  No oropharyngeal exudate.  Eyes:     General: No scleral icterus.       Right eye: No discharge.        Left eye: No discharge.     Conjunctiva/sclera: Conjunctivae normal.  Cardiovascular:     Rate and Rhythm: Normal rate and regular rhythm.     Heart sounds: Normal heart sounds. No murmur heard.  No friction rub. No gallop.   Pulmonary:     Effort: Pulmonary effort is normal. No respiratory distress.     Breath sounds: Normal breath sounds. No wheezing or rales.  Abdominal:     General: Bowel sounds are normal. There is no distension.     Palpations: Abdomen is soft. There is no mass.     Tenderness: There is no abdominal tenderness. There is no guarding or rebound.  Musculoskeletal:     Cervical back: Normal range of motion and neck supple.  Lymphadenopathy:     Cervical: No cervical adenopathy.  Skin:    General: Skin is warm and dry.     Findings: No erythema or rash.  Neurological:     Mental Status: She is alert.     Coordination: Coordination normal.  Psychiatric:        Behavior: Behavior normal.        Thought Content: Thought content normal.        Judgment: Judgment  normal.     Lab Review:     Component Value Date/Time   NA 133 (L) 02/22/2020 1400   K 2.9 (L) 02/22/2020 1400   CL 100 02/22/2020 1400   CO2 23 02/22/2020 1400   GLUCOSE 95 02/22/2020 1400   BUN 5 (L) 02/22/2020 1400   CREATININE 0.78 02/22/2020 1400   CALCIUM 8.6 (L) 02/22/2020 1400   PROT 7.3 02/22/2020 1400   ALBUMIN 2.5 (L) 02/22/2020 1400   AST 48 (H) 02/22/2020 1400   ALT 29 02/22/2020 1400   ALKPHOS 169 (H) 02/22/2020 1400   BILITOT 0.5 02/22/2020 1400   GFRNONAA >60 02/22/2020 1400   GFRAA >60 12/19/2019 1145       Component Value Date/Time   WBC 1.5 (L) 02/22/2020 1400   WBC 5.7 11/03/2019 1308   RBC 2.34 (L) 02/22/2020 1400   HGB 8.6 (L) 02/22/2020 1400   HCT 24.7 (L) 02/22/2020 1400   PLT 92 (L) 02/22/2020 1400   MCV 105.6 (H) 02/22/2020 1400   MCH 36.8 (H) 02/22/2020 1400   MCHC 34.8 02/22/2020 1400   RDW 15.4 02/22/2020 1400   LYMPHSABS 0.9 02/22/2020 1400   MONOABS 0.2 02/22/2020 1400   EOSABS 0.0 02/22/2020 1400   BASOSABS 0.0 02/22/2020 1400   -------------------------------  Imaging from last 24 hours (if applicable):  Radiology interpretation: CT Chest W Contrast  Result Date: 02/09/2020 CLINICAL DATA:  Primary Cancer Type: Lung Imaging Indication: Assess response to therapy Interval therapy since last imaging? Yes Initial Cancer Diagnosis Date: 08/10/2019; Established by: Biopsy-proven Detailed Pathology: Stage IV non-small cell lung cancer, adenocarcinoma. Primary Tumor location:  Right upper lobe. Surgeries: No. Chemotherapy: Yes; Ongoing? Yes; Most recent administration: 12/27/2019 Immunotherapy?  Yes; Type: Keytruda; Ongoing? Yes Radiation therapy? No EXAM: CT CHEST, ABDOMEN, AND PELVIS WITH CONTRAST TECHNIQUE: Multidetector CT imaging of the chest, abdomen and pelvis was performed following the standard protocol during bolus administration of intravenous contrast. CONTRAST:  189mL OMNIPAQUE IOHEXOL 300 MG/ML  SOLN COMPARISON:  Most recent CT  chest, abdomen and pelvis 11/30/2019. 09/01/2019 PET-CT. FINDINGS: CT CHEST FINDINGS Cardiovascular:  Aortic atherosclerosis. Tortuous thoracic aorta. Normal heart size, without pericardial effusion. Lad coronary artery calcification. Right Port-A-Cath tip mid right atrium. No central pulmonary embolism, on this non-dedicated study. Mediastinum/Nodes: No supraclavicular adenopathy. No mediastinal adenopathy. Right hilar node of 1.0 cm is similar to 1.1 cm on the prior, not pathologic by size criteria. Lungs/Pleura: No pleural fluid.  Moderate centrilobular emphysema. Posterior right upper lobe pleural-based lung lesion measures 2.5 x 2.3 Cm on 40/4. Compare 2.4 x 2.4 cm on the prior exam (when remeasured). A spiculated nodule along the right minor fissure measures 1.5 x 1.5 cm on 57/4. Compare similar on the prior exam (when remeasured). Just central to this, nodularity along the more posterior minor fissure measures 1.3 x 0.6 cm on 58/4 versus 1.2 x 0.8 cm on the prior exam, suggesting stability. Smaller nodules along the right minor fissure including on 100/4 are unchanged on the order of 3 mm. Left upper lobe ground-glass nodules, maximally 8 mm on 50/4. Similar to on the prior exam (when remeasured). Probable scarring within the posterior left apex on 30/4. Musculoskeletal: Multiple remote right rib fractures. CT ABDOMEN PELVIS FINDINGS Hepatobiliary: Normal liver. Stones within a contracted gallbladder. No acute cholecystitis or biliary duct dilatation. Pancreas: Normal, without mass or ductal dilatation. Spleen: Normal in size, without focal abnormality. Adrenals/Urinary Tract: Normal adrenal glands. Left renal too small to characterize lesions. Heterogeneous right greater than left renal enhancement is most significant on delayed images, felt to be similar. Normal urinary bladder. Stomach/Bowel: Gastric cardia antral underdistention. Normal colon and terminal ileum. Normal small bowel. Vascular/Lymphatic:  Aortic atherosclerosis. No abdominopelvic adenopathy. Reproductive: Central uterine body hyperenhancing 8 mm lesion is likely a fibroid. No adnexal mass. Other: No significant free fluid. Mild pelvic floor laxity. Ventral abdominal wall hernias contain fat. No evidence of omental or peritoneal disease. Musculoskeletal: Mild disc bulge at L4-5. IMPRESSION: 1. Similar right upper lobe/paraspinal lung lesion. Smaller nodules, including a dominant inferior right upper lobe nodule contacting the minor fissure are similar. 2. No thoracic adenopathy. 3. No findings of subdiaphragmatic metastatic disease. 4. Aortic atherosclerosis (ICD10-I70.0), coronary artery atherosclerosis and emphysema (ICD10-J43.9). 5. Uterine fibroid. 6. Similar heterogeneous renal enhancement, worse on the right. Suspicious for pyelonephritis. 7. Cholelithiasis. Electronically Signed   By: Abigail Miyamoto M.D.   On: 02/09/2020 10:38   CT Abdomen Pelvis W Contrast  Result Date: 02/09/2020 CLINICAL DATA:  Primary Cancer Type: Lung Imaging Indication: Assess response to therapy Interval therapy since last imaging? Yes Initial Cancer Diagnosis Date: 08/10/2019; Established by: Biopsy-proven Detailed Pathology: Stage IV non-small cell lung cancer, adenocarcinoma. Primary Tumor location:  Right upper lobe. Surgeries: No. Chemotherapy: Yes; Ongoing? Yes; Most recent administration: 12/27/2019 Immunotherapy?  Yes; Type: Keytruda; Ongoing? Yes Radiation therapy? No EXAM: CT CHEST, ABDOMEN, AND PELVIS WITH CONTRAST TECHNIQUE: Multidetector CT imaging of the chest, abdomen and pelvis was performed following the standard protocol during bolus administration of intravenous contrast. CONTRAST:  1106mL OMNIPAQUE IOHEXOL 300 MG/ML  SOLN COMPARISON:  Most recent CT chest, abdomen and pelvis 11/30/2019. 09/01/2019 PET-CT. FINDINGS: CT CHEST FINDINGS Cardiovascular: Aortic atherosclerosis. Tortuous thoracic aorta. Normal heart size, without pericardial effusion. Lad  coronary artery calcification. Right Port-A-Cath tip mid right atrium. No central pulmonary embolism, on this non-dedicated study. Mediastinum/Nodes: No supraclavicular adenopathy. No mediastinal adenopathy. Right hilar node of 1.0 cm is similar to 1.1 cm on the prior, not pathologic by size criteria. Lungs/Pleura: No pleural fluid.  Moderate centrilobular emphysema. Posterior right upper lobe pleural-based lung lesion measures 2.5 x 2.3  Cm on 40/4. Compare 2.4 x 2.4 cm on the prior exam (when remeasured). A spiculated nodule along the right minor fissure measures 1.5 x 1.5 cm on 57/4. Compare similar on the prior exam (when remeasured). Just central to this, nodularity along the more posterior minor fissure measures 1.3 x 0.6 cm on 58/4 versus 1.2 x 0.8 cm on the prior exam, suggesting stability. Smaller nodules along the right minor fissure including on 100/4 are unchanged on the order of 3 mm. Left upper lobe ground-glass nodules, maximally 8 mm on 50/4. Similar to on the prior exam (when remeasured). Probable scarring within the posterior left apex on 30/4. Musculoskeletal: Multiple remote right rib fractures. CT ABDOMEN PELVIS FINDINGS Hepatobiliary: Normal liver. Stones within a contracted gallbladder. No acute cholecystitis or biliary duct dilatation. Pancreas: Normal, without mass or ductal dilatation. Spleen: Normal in size, without focal abnormality. Adrenals/Urinary Tract: Normal adrenal glands. Left renal too small to characterize lesions. Heterogeneous right greater than left renal enhancement is most significant on delayed images, felt to be similar. Normal urinary bladder. Stomach/Bowel: Gastric cardia antral underdistention. Normal colon and terminal ileum. Normal small bowel. Vascular/Lymphatic: Aortic atherosclerosis. No abdominopelvic adenopathy. Reproductive: Central uterine body hyperenhancing 8 mm lesion is likely a fibroid. No adnexal mass. Other: No significant free fluid. Mild pelvic floor  laxity. Ventral abdominal wall hernias contain fat. No evidence of omental or peritoneal disease. Musculoskeletal: Mild disc bulge at L4-5. IMPRESSION: 1. Similar right upper lobe/paraspinal lung lesion. Smaller nodules, including a dominant inferior right upper lobe nodule contacting the minor fissure are similar. 2. No thoracic adenopathy. 3. No findings of subdiaphragmatic metastatic disease. 4. Aortic atherosclerosis (ICD10-I70.0), coronary artery atherosclerosis and emphysema (ICD10-J43.9). 5. Uterine fibroid. 6. Similar heterogeneous renal enhancement, worse on the right. Suspicious for pyelonephritis. 7. Cholelithiasis. Electronically Signed   By: Abigail Miyamoto M.D.   On: 02/09/2020 10:38        This case was discussed with Dr. Julien Nordmann. He expressed agreement with my management of this patient.

## 2020-03-06 ENCOUNTER — Inpatient Hospital Stay: Payer: Medicaid Other

## 2020-03-06 ENCOUNTER — Encounter: Payer: Self-pay | Admitting: *Deleted

## 2020-03-06 ENCOUNTER — Other Ambulatory Visit: Payer: Self-pay

## 2020-03-06 ENCOUNTER — Inpatient Hospital Stay (HOSPITAL_BASED_OUTPATIENT_CLINIC_OR_DEPARTMENT_OTHER): Payer: Medicaid Other | Admitting: Internal Medicine

## 2020-03-06 ENCOUNTER — Encounter: Payer: Self-pay | Admitting: Internal Medicine

## 2020-03-06 ENCOUNTER — Other Ambulatory Visit: Payer: Self-pay | Admitting: Medical Oncology

## 2020-03-06 VITALS — BP 126/77 | HR 94 | Temp 97.8°F | Resp 18 | Ht 66.0 in | Wt 113.2 lb

## 2020-03-06 DIAGNOSIS — C3491 Malignant neoplasm of unspecified part of right bronchus or lung: Secondary | ICD-10-CM

## 2020-03-06 DIAGNOSIS — E876 Hypokalemia: Secondary | ICD-10-CM

## 2020-03-06 DIAGNOSIS — Z5111 Encounter for antineoplastic chemotherapy: Secondary | ICD-10-CM | POA: Diagnosis not present

## 2020-03-06 DIAGNOSIS — Z5112 Encounter for antineoplastic immunotherapy: Secondary | ICD-10-CM

## 2020-03-06 DIAGNOSIS — D709 Neutropenia, unspecified: Secondary | ICD-10-CM

## 2020-03-06 LAB — CBC WITH DIFFERENTIAL (CANCER CENTER ONLY)
Abs Immature Granulocytes: 0.01 10*3/uL (ref 0.00–0.07)
Basophils Absolute: 0 10*3/uL (ref 0.0–0.1)
Basophils Relative: 0 %
Eosinophils Absolute: 0 10*3/uL (ref 0.0–0.5)
Eosinophils Relative: 0 %
HCT: 28.7 % — ABNORMAL LOW (ref 36.0–46.0)
Hemoglobin: 9.7 g/dL — ABNORMAL LOW (ref 12.0–15.0)
Immature Granulocytes: 0 %
Lymphocytes Relative: 33 %
Lymphs Abs: 1.2 10*3/uL (ref 0.7–4.0)
MCH: 37 pg — ABNORMAL HIGH (ref 26.0–34.0)
MCHC: 33.8 g/dL (ref 30.0–36.0)
MCV: 109.5 fL — ABNORMAL HIGH (ref 80.0–100.0)
Monocytes Absolute: 0.6 10*3/uL (ref 0.1–1.0)
Monocytes Relative: 16 %
Neutro Abs: 1.8 10*3/uL (ref 1.7–7.7)
Neutrophils Relative %: 51 %
Platelet Count: 373 10*3/uL (ref 150–400)
RBC: 2.62 MIL/uL — ABNORMAL LOW (ref 3.87–5.11)
RDW: 17.1 % — ABNORMAL HIGH (ref 11.5–15.5)
WBC Count: 3.5 10*3/uL — ABNORMAL LOW (ref 4.0–10.5)
nRBC: 0 % (ref 0.0–0.2)

## 2020-03-06 LAB — CMP (CANCER CENTER ONLY)
ALT: 54 U/L — ABNORMAL HIGH (ref 0–44)
AST: 85 U/L — ABNORMAL HIGH (ref 15–41)
Albumin: 2.6 g/dL — ABNORMAL LOW (ref 3.5–5.0)
Alkaline Phosphatase: 206 U/L — ABNORMAL HIGH (ref 38–126)
Anion gap: 9 (ref 5–15)
BUN: 4 mg/dL — ABNORMAL LOW (ref 6–20)
CO2: 23 mmol/L (ref 22–32)
Calcium: 8.9 mg/dL (ref 8.9–10.3)
Chloride: 105 mmol/L (ref 98–111)
Creatinine: 0.84 mg/dL (ref 0.44–1.00)
GFR, Estimated: >60 mL/min (ref >60)
Glucose, Bld: 114 mg/dL — ABNORMAL HIGH (ref 70–99)
Potassium: 2.7 mmol/L — CL (ref 3.5–5.1)
Sodium: 137 mmol/L (ref 135–145)
Total Bilirubin: 0.4 mg/dL (ref 0.3–1.2)
Total Protein: 7.1 g/dL (ref 6.5–8.1)

## 2020-03-06 LAB — TSH: TSH: 1.126 u[IU]/mL (ref 0.308–3.960)

## 2020-03-06 MED ORDER — PROCHLORPERAZINE MALEATE 10 MG PO TABS
10.0000 mg | ORAL_TABLET | Freq: Once | ORAL | Status: AC
  Administered 2020-03-06: 11:00:00 10 mg via ORAL

## 2020-03-06 MED ORDER — POTASSIUM CHLORIDE 10 MEQ/100ML IV SOLN
INTRAVENOUS | Status: AC
  Filled 2020-03-06: qty 100

## 2020-03-06 MED ORDER — SODIUM CHLORIDE 0.9 % IV SOLN
500.0000 mg/m2 | Freq: Once | INTRAVENOUS | Status: AC
  Administered 2020-03-06: 800 mg via INTRAVENOUS
  Filled 2020-03-06: qty 12

## 2020-03-06 MED ORDER — SODIUM CHLORIDE 0.9% FLUSH
10.0000 mL | Freq: Once | INTRAVENOUS | Status: AC
  Administered 2020-03-06: 09:00:00 10 mL
  Filled 2020-03-06: qty 10

## 2020-03-06 MED ORDER — POTASSIUM CHLORIDE 10 MEQ/100ML IV SOLN
10.0000 meq | INTRAVENOUS | Status: AC
  Administered 2020-03-06: 11:00:00 10 meq via INTRAVENOUS

## 2020-03-06 MED ORDER — SODIUM CHLORIDE 0.9 % IV SOLN
Freq: Once | INTRAVENOUS | Status: AC
  Filled 2020-03-06: qty 250

## 2020-03-06 MED ORDER — PROCHLORPERAZINE MALEATE 10 MG PO TABS
ORAL_TABLET | ORAL | Status: AC
  Filled 2020-03-06: qty 1

## 2020-03-06 MED ORDER — POTASSIUM CHLORIDE CRYS ER 20 MEQ PO TBCR: 20.0000 meq | EXTENDED_RELEASE_TABLET | Freq: Every day | ORAL | 0 refills | Status: DC

## 2020-03-06 MED ORDER — SODIUM CHLORIDE 0.9% FLUSH
10.0000 mL | INTRAVENOUS | Status: DC | PRN
  Administered 2020-03-06: 12:00:00 10 mL
  Filled 2020-03-06: qty 10

## 2020-03-06 MED ORDER — HEPARIN SOD (PORK) LOCK FLUSH 100 UNIT/ML IV SOLN
500.0000 [IU] | Freq: Once | INTRAVENOUS | Status: AC | PRN
  Administered 2020-03-06: 12:00:00 500 [IU]
  Filled 2020-03-06: qty 5

## 2020-03-06 MED FILL — POTASSIUM CHLORIDE CRYS ER: 20 | 10 days supply | Qty: 10 | Fill #0

## 2020-03-06 NOTE — Progress Notes (Signed)
[place] [phone]   [phone]  OFFICE PROGRESS NOTE  Pcp, No No address on file  DIAGNOSIS: Stage IV (T2 a, N2, M1 B) non-small cell lung cancer, adenocarcinoma presented with large right upper lobe lung mass in addition to suspicious right paratracheal lymphadenopathy and bilateral pulmonary nodules as well as retroperitoneal lymphadenopathy diagnosed in May 2021. The patient has no actionable mutations and has negative PD-L1 expression on molecular studies by foundation 1.  PRIOR THERAPY: None  CURRENT THERAPY: Systemic chemotherapy with carboplatin for an AUC of 5, Alimta 500 mg/m2, and Keytruda 200 mg IV every 3 weeks. First dose 09/28/2019. Status post 7 cycles. Starting from cycle #5 the patient is on maintenance treatment with Alimta and Keytruda every 3 weeks.  INTERVAL HISTORY: Brenda [name] 59 y.o. female returns to the clinic today for follow-up visit.  The patient is feeling fine today with no concerning complaints.  She tolerated the last cycle of her treatment fairly well.  She denied having any current chest pain, shortness of breath, cough or hemoptysis.  She denied having any fever or chills.  She has no nausea, vomiting, diarrhea or constipation.  She has no headache or visual changes.  She is here today for evaluation before starting cycle #8 of her treatment.  MEDICAL HISTORY: Past Medical History:  Diagnosis Date  . Breast cancer ([place])   . Depression   . Mental disorder     ALLERGIES:  is allergic to aspirin adult low [aspirin].  MEDICATIONS:  Current Outpatient Medications  Medication Sig Dispense Refill  . calcium carbonate (OSCAL) 1500 (600 Ca) MG TABS tablet Take 600 mg by mouth daily.    . folic acid (FOLVITE) 1 MG tablet Take 1 tablet (1 mg total) by mouth daily. 30 tablet 4  . lidocaine-prilocaine (EMLA) cream Apply 1 application topically as needed. 30 g 2  . potassium chloride SA (KLOR-CON) 20 MEQ tablet  Take 1 tablet (20 mEq total) by mouth daily. 14 tablet 0  . prochlorperazine (COMPAZINE) 10 MG tablet Take 1 tablet (10 mg total) by mouth every 6 (six) hours as needed for nausea or vomiting. 30 tablet 3   No current facility-administered medications for this visit.    SURGICAL HISTORY:  Past Surgical History:  Procedure Laterality Date  . IR IMAGING GUIDED PORT INSERTION  11/03/2019  . TUBAL LIGATION    . VIDEO BRONCHOSCOPY WITH ENDOBRONCHIAL NAVIGATION N/A 04/04/2019   Procedure: VIDEO BRONCHOSCOPY WITH ENDOBRONCHIAL NAVIGATION;  Surgeon: Garner Nash, DO;  Location: [facility];  Service: Thoracic;  Laterality: N/A;  . VIDEO BRONCHOSCOPY WITH ENDOBRONCHIAL ULTRASOUND N/A 04/04/2019   Procedure: VIDEO BRONCHOSCOPY WITH ENDOBRONCHIAL ULTRASOUND;  Surgeon: Garner Nash, DO;  Location: [place];  Service: Thoracic;  Laterality: N/A;    REVIEW OF SYSTEMS:  A comprehensive review of systems was negative except for: Constitutional: positive for fatigue   PHYSICAL EXAMINATION: General appearance: alert, cooperative, fatigued and no distress Head: Normocephalic, without obvious abnormality, atraumatic Neck: no adenopathy, no JVD, supple, symmetrical, trachea midline and thyroid not enlarged, symmetric, no tenderness/mass/nodules Lymph nodes: Cervical, supraclavicular, and axillary nodes normal. Resp: clear to auscultation bilaterally Back: symmetric, no curvature. ROM normal. No CVA tenderness. Cardio: regular rate and rhythm, S1, S2 normal, no murmur, click, rub or gallop GI: soft, non-tender; bowel sounds normal; no masses,  no organomegaly Extremities: extremities normal, atraumatic, no cyanosis or edema  ECOG PERFORMANCE STATUS: 1 - Symptomatic but completely ambulatory  Blood  pressure 126/77, pulse 94, temperature 97.8 F (36.6 C), temperature source Tympanic, resp. rate 18, height 5' 6"  (1.676 m), weight 113 lb 3.2 oz (51.3 kg), last menstrual period 01/19/2011, SpO2 100 %.  LABORATORY  DATA: Lab Results  Component Value Date   WBC 3.5 (L) 03/06/2020   HGB 9.7 (L) 03/06/2020   HCT 28.7 (L) 03/06/2020   MCV 109.5 (H) 03/06/2020   PLT 373 03/06/2020      Chemistry      Component Value Date/Time   NA 133 (L) 02/22/2020 1400   K 2.9 (L) 02/22/2020 1400   CL 100 02/22/2020 1400   CO2 23 02/22/2020 1400   BUN 5 (L) 02/22/2020 1400   CREATININE 0.78 02/22/2020 1400      Component Value Date/Time   CALCIUM 8.6 (L) 02/22/2020 1400   ALKPHOS 169 (H) 02/22/2020 1400   AST 48 (H) 02/22/2020 1400   ALT 29 02/22/2020 1400   BILITOT 0.5 02/22/2020 1400       RADIOGRAPHIC STUDIES: CT Chest W Contrast  Result Date: 02/09/2020 CLINICAL DATA:  Primary Cancer Type: Lung Imaging Indication: Assess response to therapy Interval therapy since last imaging? Yes Initial Cancer Diagnosis Date: 08/10/2019; Established by: Biopsy-proven Detailed Pathology: Stage IV non-small cell lung cancer, adenocarcinoma. Primary Tumor location:  Right upper lobe. Surgeries: No. Chemotherapy: Yes; Ongoing? Yes; Most recent administration: 12/27/2019 Immunotherapy?  Yes; Type: Keytruda; Ongoing? Yes Radiation therapy? No EXAM: CT CHEST, ABDOMEN, AND PELVIS WITH CONTRAST TECHNIQUE: Multidetector CT imaging of the chest, abdomen and pelvis was performed following the standard protocol during bolus administration of intravenous contrast. CONTRAST:  159m OMNIPAQUE IOHEXOL 300 MG/ML  SOLN COMPARISON:  Most recent CT chest, abdomen and pelvis 11/30/2019. 09/01/2019 PET-CT. FINDINGS: CT CHEST FINDINGS Cardiovascular: Aortic atherosclerosis. Tortuous thoracic aorta. Normal heart size, without pericardial effusion. Lad coronary artery calcification. Right Port-A-Cath tip mid right atrium. No central pulmonary embolism, on this non-dedicated study. Mediastinum/Nodes: No supraclavicular adenopathy. No mediastinal adenopathy. Right hilar node of 1.0 cm is similar to 1.1 cm on the prior, not pathologic by size criteria.  Lungs/Pleura: No pleural fluid.  Moderate centrilobular emphysema. Posterior right upper lobe pleural-based lung lesion measures 2.5 x 2.3 Cm on 40/4. Compare 2.4 x 2.4 cm on the prior exam (when remeasured). A spiculated nodule along the right minor fissure measures 1.5 x 1.5 cm on 57/4. Compare similar on the prior exam (when remeasured). Just central to this, nodularity along the more posterior minor fissure measures 1.3 x 0.6 cm on 58/4 versus 1.2 x 0.8 cm on the prior exam, suggesting stability. Smaller nodules along the right minor fissure including on 100/4 are unchanged on the order of 3 mm. Left upper lobe ground-glass nodules, maximally 8 mm on 50/4. Similar to on the prior exam (when remeasured). Probable scarring within the posterior left apex on 30/4. Musculoskeletal: Multiple remote right rib fractures. CT ABDOMEN PELVIS FINDINGS Hepatobiliary: Normal liver. Stones within a contracted gallbladder. No acute cholecystitis or biliary duct dilatation. Pancreas: Normal, without mass or ductal dilatation. Spleen: Normal in size, without focal abnormality. Adrenals/Urinary Tract: Normal adrenal glands. Left renal too small to characterize lesions. Heterogeneous right greater than left renal enhancement is most significant on delayed images, felt to be similar. Normal urinary bladder. Stomach/Bowel: Gastric cardia antral underdistention. Normal colon and terminal ileum. Normal small bowel. Vascular/Lymphatic: Aortic atherosclerosis. No abdominopelvic adenopathy. Reproductive: Central uterine body hyperenhancing 8 mm lesion is likely a fibroid. No adnexal mass. Other: No significant free fluid. Mild pelvic  floor laxity. Ventral abdominal wall hernias contain fat. No evidence of omental or peritoneal disease. Musculoskeletal: Mild disc bulge at L4-5. IMPRESSION: 1. Similar right upper lobe/paraspinal lung lesion. Smaller nodules, including a dominant inferior right upper lobe nodule contacting the minor fissure  are similar. 2. No thoracic adenopathy. 3. No findings of subdiaphragmatic metastatic disease. 4. Aortic atherosclerosis (ICD10-I70.0), coronary artery atherosclerosis and emphysema (ICD10-J43.9). 5. Uterine fibroid. 6. Similar heterogeneous renal enhancement, worse on the right. Suspicious for pyelonephritis. 7. Cholelithiasis. Electronically Signed   By: Abigail Miyamoto M.D.   On: 02/09/2020 10:38   CT Abdomen Pelvis W Contrast  Result Date: 02/09/2020 CLINICAL DATA:  Primary Cancer Type: Lung Imaging Indication: Assess response to therapy Interval therapy since last imaging? Yes Initial Cancer Diagnosis Date: 08/10/2019; Established by: Biopsy-proven Detailed Pathology: Stage IV non-small cell lung cancer, adenocarcinoma. Primary Tumor location:  Right upper lobe. Surgeries: No. Chemotherapy: Yes; Ongoing? Yes; Most recent administration: 12/27/2019 Immunotherapy?  Yes; Type: Keytruda; Ongoing? Yes Radiation therapy? No EXAM: CT CHEST, ABDOMEN, AND PELVIS WITH CONTRAST TECHNIQUE: Multidetector CT imaging of the chest, abdomen and pelvis was performed following the standard protocol during bolus administration of intravenous contrast. CONTRAST:  145m OMNIPAQUE IOHEXOL 300 MG/ML  SOLN COMPARISON:  Most recent CT chest, abdomen and pelvis 11/30/2019. 09/01/2019 PET-CT. FINDINGS: CT CHEST FINDINGS Cardiovascular: Aortic atherosclerosis. Tortuous thoracic aorta. Normal heart size, without pericardial effusion. Lad coronary artery calcification. Right Port-A-Cath tip mid right atrium. No central pulmonary embolism, on this non-dedicated study. Mediastinum/Nodes: No supraclavicular adenopathy. No mediastinal adenopathy. Right hilar node of 1.0 cm is similar to 1.1 cm on the prior, not pathologic by size criteria. Lungs/Pleura: No pleural fluid.  Moderate centrilobular emphysema. Posterior right upper lobe pleural-based lung lesion measures 2.5 x 2.3 Cm on 40/4. Compare 2.4 x 2.4 cm on the prior exam (when  remeasured). A spiculated nodule along the right minor fissure measures 1.5 x 1.5 cm on 57/4. Compare similar on the prior exam (when remeasured). Just central to this, nodularity along the more posterior minor fissure measures 1.3 x 0.6 cm on 58/4 versus 1.2 x 0.8 cm on the prior exam, suggesting stability. Smaller nodules along the right minor fissure including on 100/4 are unchanged on the order of 3 mm. Left upper lobe ground-glass nodules, maximally 8 mm on 50/4. Similar to on the prior exam (when remeasured). Probable scarring within the posterior left apex on 30/4. Musculoskeletal: Multiple remote right rib fractures. CT ABDOMEN PELVIS FINDINGS Hepatobiliary: Normal liver. Stones within a contracted gallbladder. No acute cholecystitis or biliary duct dilatation. Pancreas: Normal, without mass or ductal dilatation. Spleen: Normal in size, without focal abnormality. Adrenals/Urinary Tract: Normal adrenal glands. Left renal too small to characterize lesions. Heterogeneous right greater than left renal enhancement is most significant on delayed images, felt to be similar. Normal urinary bladder. Stomach/Bowel: Gastric cardia antral underdistention. Normal colon and terminal ileum. Normal small bowel. Vascular/Lymphatic: Aortic atherosclerosis. No abdominopelvic adenopathy. Reproductive: Central uterine body hyperenhancing 8 mm lesion is likely a fibroid. No adnexal mass. Other: No significant free fluid. Mild pelvic floor laxity. Ventral abdominal wall hernias contain fat. No evidence of omental or peritoneal disease. Musculoskeletal: Mild disc bulge at L4-5. IMPRESSION: 1. Similar right upper lobe/paraspinal lung lesion. Smaller nodules, including a dominant inferior right upper lobe nodule contacting the minor fissure are similar. 2. No thoracic adenopathy. 3. No findings of subdiaphragmatic metastatic disease. 4. Aortic atherosclerosis (ICD10-I70.0), coronary artery atherosclerosis and emphysema (ICD10-J43.9).  5. Uterine fibroid. 6. Similar heterogeneous renal  enhancement, worse on the right. Suspicious for pyelonephritis. 7. Cholelithiasis. Electronically Signed   By: Abigail Miyamoto M.D.   On: 02/09/2020 10:38    ASSESSMENT AND PLAN: This is a very pleasant 59 years old African-American female with a stage IV non-small cell lung cancer, adenocarcinoma presented with large right upper lobe lung mass in addition to suspicious right paratracheal lymphadenopathy and bilateral pulmonary nodules as well as retroperitoneal lymphadenopathy diagnosed in May 2021 with no actionable mutations and negative PD-L1 expression. The patient is currently undergoing systemic chemotherapy with carboplatin for AUC of 5, Alimta 500 mg/M2 and Keytruda 200 mg IV every 3 weeks status post 7 cycles.  Starting from cycle #5 the patient is on treatment with maintenance therapy with Alimta and Keytruda every 3 weeks.   She has been tolerating this treatment well with no concerning adverse effect except for fatigue. I recommended for her to proceed with cycle #8 today as planned. I will see her back for follow-up visit in 3 weeks for evaluation before starting cycle #9. She was advised to call immediately if she has any concerning symptoms in the interval.  The patient voices understanding of current disease status and treatment options and is in agreement with the current care plan.  All questions were answered. The patient knows to call the clinic with any problems, questions or concerns. We can certainly see the patient much sooner if necessary.   Disclaimer: This note was dictated with voice recognition software. Similar sounding words can inadvertently be transcribed and may not be corrected upon review.

## 2020-03-06 NOTE — Patient Instructions (Addendum)
[facility] Discharge Instructions for Patients Receiving Chemotherapy  Today you received the following chemotherapy agents: pemetrexed.  To help prevent nausea and vomiting after your treatment, we encourage you to take your nausea medication as directed.   If you develop nausea and vomiting that is not controlled by your nausea medication, call the clinic.   BELOW ARE SYMPTOMS THAT SHOULD BE REPORTED IMMEDIATELY:  *FEVER GREATER THAN 100.5 F  *CHILLS WITH OR WITHOUT FEVER  NAUSEA AND VOMITING THAT IS NOT CONTROLLED WITH YOUR NAUSEA MEDICATION  *UNUSUAL SHORTNESS OF BREATH  *UNUSUAL BRUISING OR BLEEDING  TENDERNESS IN MOUTH AND THROAT WITH OR WITHOUT PRESENCE OF ULCERS  *URINARY PROBLEMS  *BOWEL PROBLEMS  UNUSUAL RASH Items with * indicate a potential emergency and should be followed up as soon as possible.  Feel free to call the clinic should you have any questions or concerns. The clinic phone number is [phone].  Please show the [facility] at check-in to the Emergency Department and triage nurse.

## 2020-03-06 NOTE — Progress Notes (Signed)
I spoke with Ms. Brenda [name] today.  She is doing well and states she is feeling better.  I listened as she explained. Patient understands plan of care.  I did ask if she would like to talk with dietitian but she declined at this time.

## 2020-03-27 ENCOUNTER — Inpatient Hospital Stay: Payer: Medicaid Other

## 2020-03-27 ENCOUNTER — Inpatient Hospital Stay: Payer: Medicaid Other | Attending: Internal Medicine | Admitting: Internal Medicine

## 2020-03-27 ENCOUNTER — Other Ambulatory Visit: Payer: Self-pay

## 2020-03-27 VITALS — BP 126/88 | HR 96 | Temp 97.3°F | Resp 18 | Ht 66.0 in | Wt 110.9 lb

## 2020-03-27 DIAGNOSIS — R590 Localized enlarged lymph nodes: Secondary | ICD-10-CM | POA: Diagnosis not present

## 2020-03-27 DIAGNOSIS — Z8673 Personal history of transient ischemic attack (TIA), and cerebral infarction without residual deficits: Secondary | ICD-10-CM | POA: Insufficient documentation

## 2020-03-27 DIAGNOSIS — D61818 Other pancytopenia: Secondary | ICD-10-CM | POA: Insufficient documentation

## 2020-03-27 DIAGNOSIS — C3411 Malignant neoplasm of upper lobe, right bronchus or lung: Secondary | ICD-10-CM | POA: Diagnosis present

## 2020-03-27 DIAGNOSIS — E876 Hypokalemia: Secondary | ICD-10-CM | POA: Insufficient documentation

## 2020-03-27 DIAGNOSIS — Z5111 Encounter for antineoplastic chemotherapy: Secondary | ICD-10-CM | POA: Insufficient documentation

## 2020-03-27 DIAGNOSIS — Z853 Personal history of malignant neoplasm of breast: Secondary | ICD-10-CM | POA: Insufficient documentation

## 2020-03-27 DIAGNOSIS — M7989 Other specified soft tissue disorders: Secondary | ICD-10-CM | POA: Insufficient documentation

## 2020-03-27 DIAGNOSIS — C3491 Malignant neoplasm of unspecified part of right bronchus or lung: Secondary | ICD-10-CM

## 2020-03-27 DIAGNOSIS — R5383 Other fatigue: Secondary | ICD-10-CM | POA: Diagnosis not present

## 2020-03-27 DIAGNOSIS — J341 Cyst and mucocele of nose and nasal sinus: Secondary | ICD-10-CM | POA: Diagnosis not present

## 2020-03-27 DIAGNOSIS — R410 Disorientation, unspecified: Secondary | ICD-10-CM | POA: Diagnosis not present

## 2020-03-27 DIAGNOSIS — D709 Neutropenia, unspecified: Secondary | ICD-10-CM

## 2020-03-27 DIAGNOSIS — Z79899 Other long term (current) drug therapy: Secondary | ICD-10-CM | POA: Diagnosis not present

## 2020-03-27 DIAGNOSIS — Z886 Allergy status to analgesic agent status: Secondary | ICD-10-CM | POA: Insufficient documentation

## 2020-03-27 DIAGNOSIS — R42 Dizziness and giddiness: Secondary | ICD-10-CM | POA: Diagnosis not present

## 2020-03-27 DIAGNOSIS — Z5112 Encounter for antineoplastic immunotherapy: Secondary | ICD-10-CM | POA: Diagnosis present

## 2020-03-27 DIAGNOSIS — C3490 Malignant neoplasm of unspecified part of unspecified bronchus or lung: Secondary | ICD-10-CM

## 2020-03-27 LAB — CMP (CANCER CENTER ONLY)
ALT: 78 U/L — ABNORMAL HIGH (ref 0–44)
AST: 115 U/L — ABNORMAL HIGH (ref 15–41)
Albumin: 2.5 g/dL — ABNORMAL LOW (ref 3.5–5.0)
Alkaline Phosphatase: 239 U/L — ABNORMAL HIGH (ref 38–126)
Anion gap: 13 (ref 5–15)
BUN: 5 mg/dL — ABNORMAL LOW (ref 6–20)
CO2: 25 mmol/L (ref 22–32)
Calcium: 8.6 mg/dL — ABNORMAL LOW (ref 8.9–10.3)
Chloride: 102 mmol/L (ref 98–111)
Creatinine: 0.92 mg/dL (ref 0.44–1.00)
GFR, Estimated: >60 mL/min (ref >60)
Glucose, Bld: 93 mg/dL (ref 70–99)
Potassium: 3.2 mmol/L — ABNORMAL LOW (ref 3.5–5.1)
Sodium: 140 mmol/L (ref 135–145)
Total Bilirubin: 0.5 mg/dL (ref 0.3–1.2)
Total Protein: 7.3 g/dL (ref 6.5–8.1)

## 2020-03-27 LAB — CBC WITH DIFFERENTIAL (CANCER CENTER ONLY)
Abs Immature Granulocytes: 0.02 10*3/uL (ref 0.00–0.07)
Basophils Absolute: 0 10*3/uL (ref 0.0–0.1)
Basophils Relative: 0 %
Eosinophils Absolute: 0 10*3/uL (ref 0.0–0.5)
Eosinophils Relative: 0 %
HCT: 25 % — ABNORMAL LOW (ref 36.0–46.0)
Hemoglobin: 8.7 g/dL — ABNORMAL LOW (ref 12.0–15.0)
Immature Granulocytes: 1 %
Lymphocytes Relative: 28 %
Lymphs Abs: 1.1 10*3/uL (ref 0.7–4.0)
MCH: 37.7 pg — ABNORMAL HIGH (ref 26.0–34.0)
MCHC: 34.8 g/dL (ref 30.0–36.0)
MCV: 108.2 fL — ABNORMAL HIGH (ref 80.0–100.0)
Monocytes Absolute: 0.7 10*3/uL (ref 0.1–1.0)
Monocytes Relative: 18 %
Neutro Abs: 2.1 10*3/uL (ref 1.7–7.7)
Neutrophils Relative %: 53 %
Platelet Count: 443 10*3/uL — ABNORMAL HIGH (ref 150–400)
RBC: 2.31 MIL/uL — ABNORMAL LOW (ref 3.87–5.11)
RDW: 18.3 % — ABNORMAL HIGH (ref 11.5–15.5)
WBC Count: 3.9 10*3/uL — ABNORMAL LOW (ref 4.0–10.5)
nRBC: 0 % (ref 0.0–0.2)

## 2020-03-27 LAB — TSH: TSH: 2.175 u[IU]/mL (ref 0.308–3.960)

## 2020-03-27 MED ORDER — SODIUM CHLORIDE 0.9 % IV SOLN
Freq: Once | INTRAVENOUS | Status: AC
  Filled 2020-03-27: qty 250

## 2020-03-27 MED ORDER — PROCHLORPERAZINE MALEATE 10 MG PO TABS
10.0000 mg | ORAL_TABLET | Freq: Once | ORAL | Status: AC
  Administered 2020-03-27: 10 mg via ORAL

## 2020-03-27 MED ORDER — SODIUM CHLORIDE 0.9 % IV SOLN
500.0000 mg/m2 | Freq: Once | INTRAVENOUS | Status: AC
  Administered 2020-03-27: 800 mg via INTRAVENOUS
  Filled 2020-03-27: qty 20

## 2020-03-27 MED ORDER — CYANOCOBALAMIN 1000 MCG/ML IJ SOLN
INTRAMUSCULAR | Status: AC
  Filled 2020-03-27: qty 1

## 2020-03-27 MED ORDER — PROCHLORPERAZINE MALEATE 10 MG PO TABS
ORAL_TABLET | ORAL | Status: AC
  Filled 2020-03-27: qty 1

## 2020-03-27 MED ORDER — SODIUM CHLORIDE 0.9 % IV SOLN
200.0000 mg | Freq: Once | INTRAVENOUS | Status: AC
  Administered 2020-03-27: 200 mg via INTRAVENOUS
  Filled 2020-03-27: qty 8

## 2020-03-27 MED ORDER — HEPARIN SOD (PORK) LOCK FLUSH 100 UNIT/ML IV SOLN
500.0000 [IU] | Freq: Once | INTRAVENOUS | Status: AC | PRN
  Administered 2020-03-27: 500 [IU]
  Filled 2020-03-27: qty 5

## 2020-03-27 MED ORDER — CYANOCOBALAMIN 1000 MCG/ML IJ SOLN: 1000.0000 ug | Freq: Once | INTRAMUSCULAR | Status: DC

## 2020-03-27 MED ORDER — SODIUM CHLORIDE 0.9% FLUSH
10.0000 mL | Freq: Once | INTRAVENOUS | Status: AC
  Administered 2020-03-27: 10 mL
  Filled 2020-03-27: qty 10

## 2020-03-27 MED ORDER — SODIUM CHLORIDE 0.9% FLUSH
10.0000 mL | INTRAVENOUS | Status: DC | PRN
  Administered 2020-03-27: 10 mL
  Filled 2020-03-27: qty 10

## 2020-03-27 NOTE — Patient Instructions (Signed)
[facility] Discharge Instructions for Patients Receiving Chemotherapy  Today you received the following chemotherapy agents: pembrolizumab/pemetrexed.  To help prevent nausea and vomiting after your treatment, we encourage you to take your nausea medication as directed.   If you develop nausea and vomiting that is not controlled by your nausea medication, call the clinic.   BELOW ARE SYMPTOMS THAT SHOULD BE REPORTED IMMEDIATELY:  *FEVER GREATER THAN 100.5 F  *CHILLS WITH OR WITHOUT FEVER  NAUSEA AND VOMITING THAT IS NOT CONTROLLED WITH YOUR NAUSEA MEDICATION  *UNUSUAL SHORTNESS OF BREATH  *UNUSUAL BRUISING OR BLEEDING  TENDERNESS IN MOUTH AND THROAT WITH OR WITHOUT PRESENCE OF ULCERS  *URINARY PROBLEMS  *BOWEL PROBLEMS  UNUSUAL RASH Items with * indicate a potential emergency and should be followed up as soon as possible.  Feel free to call the clinic should you have any questions or concerns. The clinic phone number is [phone].  Please show the [facility] at check-in to the Emergency Department and triage nurse.

## 2020-03-27 NOTE — Progress Notes (Signed)
Per Dr Julien Nordmann ,it is okay to treat pt today with Alimta and Keytruda and CMP results from today.

## 2020-03-27 NOTE — Progress Notes (Signed)
[facility] Telephone:(336) [phone]   Fax:(336) [phone]  OFFICE PROGRESS NOTE  Pcp, No No address on file  DIAGNOSIS: Stage IV (T2 a, N2, M1 B) non-small cell lung cancer, adenocarcinoma presented with large right upper lobe lung mass in addition to suspicious right paratracheal lymphadenopathy and bilateral pulmonary nodules as well as retroperitoneal lymphadenopathy diagnosed in May 2021. The patient has no actionable mutations and has negative PD-L1 expression on molecular studies by foundation 1.  PRIOR THERAPY: None  CURRENT THERAPY: Systemic chemotherapy with carboplatin for an AUC of 5, Alimta 500 mg/m2, and Keytruda 200 mg IV every 3 weeks. First dose 09/28/2019. Status post 8 cycles. Starting from cycle #5 the patient is on maintenance treatment with Alimta and Keytruda every 3 weeks.  INTERVAL HISTORY: Brenda [name] 60 y.o. female returns to the clinic today for follow-up visit.  The patient is feeling fine today with no concerning complaints.  She denied having any current chest pain, shortness of breath, cough or hemoptysis.  She denied having any fever or chills.  She has no nausea, vomiting, diarrhea or constipation.  She denied having any headache or visual changes.  She continues to tolerate her maintenance treatment with Alimta and Keytruda fairly well.  The patient is here today for evaluation before starting cycle #9 of her treatment.  MEDICAL HISTORY: Past Medical History:  Diagnosis Date  . Breast cancer ([place])   . Depression   . Mental disorder     ALLERGIES:  is allergic to aspirin adult low [aspirin].  MEDICATIONS:  Current Outpatient Medications  Medication Sig Dispense Refill  . calcium carbonate (OSCAL) 1500 (600 Ca) MG TABS tablet Take 600 mg by mouth daily.    . folic acid (FOLVITE) 1 MG tablet Take 1 tablet (1 mg total) by mouth daily. 30 tablet 4  . lidocaine-prilocaine (EMLA) cream Apply 1 application topically as needed. 30 g 2   . potassium chloride SA (KLOR-CON) 20 MEQ tablet Take 1 tablet (20 mEq total) by mouth daily. 10 tablet 0  . prochlorperazine (COMPAZINE) 10 MG tablet Take 1 tablet (10 mg total) by mouth every 6 (six) hours as needed for nausea or vomiting. 30 tablet 3   No current facility-administered medications for this visit.    SURGICAL HISTORY:  Past Surgical History:  Procedure Laterality Date  . IR IMAGING GUIDED PORT INSERTION  11/03/2019  . TUBAL LIGATION    . VIDEO BRONCHOSCOPY WITH ENDOBRONCHIAL NAVIGATION N/A 04/04/2019   Procedure: VIDEO BRONCHOSCOPY WITH ENDOBRONCHIAL NAVIGATION;  Surgeon: Garner Nash, DO;  Location: [facility];  Service: Thoracic;  Laterality: N/A;  . VIDEO BRONCHOSCOPY WITH ENDOBRONCHIAL ULTRASOUND N/A 04/04/2019   Procedure: VIDEO BRONCHOSCOPY WITH ENDOBRONCHIAL ULTRASOUND;  Surgeon: Garner Nash, DO;  Location: [facility];  Service: Thoracic;  Laterality: N/A;    REVIEW OF SYSTEMS:  A comprehensive review of systems was negative except for: Constitutional: positive for fatigue   PHYSICAL EXAMINATION: General appearance: alert, cooperative, fatigued and no distress Head: Normocephalic, without obvious abnormality, atraumatic Neck: no adenopathy, no JVD, supple, symmetrical, trachea midline and thyroid not enlarged, symmetric, no tenderness/mass/nodules Lymph nodes: Cervical, supraclavicular, and axillary nodes normal. Resp: clear to auscultation bilaterally Back: symmetric, no curvature. ROM normal. No CVA tenderness. Cardio: regular rate and rhythm, S1, S2 normal, no murmur, click, rub or gallop GI: soft, non-tender; bowel sounds normal; no masses,  no organomegaly Extremities: edema Trace edema bilaterally  ECOG PERFORMANCE STATUS: 1 - Symptomatic but completely ambulatory  Blood pressure 126/88, pulse 96, temperature (!) 97.3 F (36.3 C), temperature source Tympanic, resp. rate 18, height _0  (1.676 m), weight 110 lb 14.4 oz (50.3 kg), last menstrual period  01/19/2011, SpO2 100 %.  LABORATORY DATA: Lab Results  Component Value Date   WBC 3.9 (L) 03/27/2020   HGB 8.7 (L) 03/27/2020   HCT 25.0 (L) 03/27/2020   MCV 108.2 (H) 03/27/2020   PLT 443 (H) 03/27/2020      Chemistry      Component Value Date/Time   NA 140 03/27/2020 1130   K 3.2 (L) 03/27/2020 1130   CL 102 03/27/2020 1130   CO2 25 03/27/2020 1130   BUN 5 (L) 03/27/2020 1130   CREATININE 0.92 03/27/2020 1130      Component Value Date/Time   CALCIUM 8.6 (L) 03/27/2020 1130   ALKPHOS 239 (H) 03/27/2020 1130   AST 115 (H) 03/27/2020 1130   ALT 78 (H) 03/27/2020 1130   BILITOT 0.5 03/27/2020 1130       RADIOGRAPHIC STUDIES: No results found.  ASSESSMENT AND PLAN: This is a very pleasant 60 years old African-American female with a stage IV non-small cell lung cancer, adenocarcinoma presented with large right upper lobe lung mass in addition to suspicious right paratracheal lymphadenopathy and bilateral pulmonary nodules as well as retroperitoneal lymphadenopathy diagnosed in May 2021 with no actionable mutations and negative PD-L1 expression. The patient is currently undergoing systemic chemotherapy with carboplatin for AUC of 5, Alimta 500 mg/M2 and Keytruda 200 mg IV every 3 weeks status post 8 cycles.  Starting from cycle #5 the patient is on treatment with maintenance therapy with Alimta and Keytruda every 3 weeks.   The patient has been tolerating this treatment well with no concerning adverse effects. I recommended for her to proceed with cycle #9 today as planned. I will see her back for follow-up visit in 3 weeks for evaluation with repeat CT scan of the chest, abdomen pelvis for restaging of her disease. For the swelling of the lower extremities, I advised the patient to elevate her legs when she is laying down or sitting.  I also advised her to cut on salt intake. She was advised to call immediately if she has any other concerning symptoms in the interval.  The  patient voices understanding of current disease status and treatment options and is in agreement with the current care plan.  All questions were answered. The patient knows to call the clinic with any problems, questions or concerns. We can certainly see the patient much sooner if necessary.   Disclaimer: This note was dictated with voice recognition software. Similar sounding words can inadvertently be transcribed and may not be corrected upon review.

## 2020-04-01 ENCOUNTER — Telehealth: Payer: Self-pay | Admitting: Internal Medicine

## 2020-04-01 NOTE — Telephone Encounter (Signed)
Scheduled per 1/5 los. Pt will receive an updated appt calendar per next visit appt notes

## 2020-04-07 ENCOUNTER — Other Ambulatory Visit: Payer: Self-pay

## 2020-04-07 ENCOUNTER — Emergency Department (HOSPITAL_COMMUNITY): Payer: Medicaid Other

## 2020-04-07 ENCOUNTER — Inpatient Hospital Stay (HOSPITAL_COMMUNITY)
Admission: EM | Admit: 2020-04-07 | Discharge: 2020-04-11 | DRG: 811 | Disposition: A | Discharge status: Home Health | Payer: Medicaid Other | Attending: Internal Medicine | Admitting: Internal Medicine

## 2020-04-07 ENCOUNTER — Encounter (HOSPITAL_COMMUNITY): Payer: Self-pay

## 2020-04-07 DIAGNOSIS — G929 Unspecified toxic encephalopathy: Secondary | ICD-10-CM | POA: Diagnosis present

## 2020-04-07 DIAGNOSIS — K921 Melena: Secondary | ICD-10-CM | POA: Diagnosis not present

## 2020-04-07 DIAGNOSIS — D696 Thrombocytopenia, unspecified: Secondary | ICD-10-CM | POA: Diagnosis not present

## 2020-04-07 DIAGNOSIS — Z20822 Contact with and (suspected) exposure to covid-19: Secondary | ICD-10-CM | POA: Diagnosis present

## 2020-04-07 DIAGNOSIS — F1910 Other psychoactive substance abuse, uncomplicated: Secondary | ICD-10-CM | POA: Diagnosis present

## 2020-04-07 DIAGNOSIS — F1410 Cocaine abuse, uncomplicated: Secondary | ICD-10-CM | POA: Diagnosis present

## 2020-04-07 DIAGNOSIS — Z79899 Other long term (current) drug therapy: Secondary | ICD-10-CM

## 2020-04-07 DIAGNOSIS — K2971 Gastritis, unspecified, with bleeding: Secondary | ICD-10-CM | POA: Diagnosis present

## 2020-04-07 DIAGNOSIS — Z8673 Personal history of transient ischemic attack (TIA), and cerebral infarction without residual deficits: Secondary | ICD-10-CM | POA: Diagnosis not present

## 2020-04-07 DIAGNOSIS — F10239 Alcohol dependence with withdrawal, unspecified: Secondary | ICD-10-CM | POA: Diagnosis present

## 2020-04-07 DIAGNOSIS — K319 Disease of stomach and duodenum, unspecified: Secondary | ICD-10-CM | POA: Diagnosis present

## 2020-04-07 DIAGNOSIS — Z886 Allergy status to analgesic agent status: Secondary | ICD-10-CM

## 2020-04-07 DIAGNOSIS — Z87891 Personal history of nicotine dependence: Secondary | ICD-10-CM

## 2020-04-07 DIAGNOSIS — D61810 Antineoplastic chemotherapy induced pancytopenia: Secondary | ICD-10-CM | POA: Diagnosis present

## 2020-04-07 DIAGNOSIS — E876 Hypokalemia: Secondary | ICD-10-CM | POA: Diagnosis present

## 2020-04-07 DIAGNOSIS — C3491 Malignant neoplasm of unspecified part of right bronchus or lung: Secondary | ICD-10-CM

## 2020-04-07 DIAGNOSIS — D61818 Other pancytopenia: Secondary | ICD-10-CM | POA: Diagnosis not present

## 2020-04-07 DIAGNOSIS — D62 Acute posthemorrhagic anemia: Secondary | ICD-10-CM | POA: Diagnosis present

## 2020-04-07 DIAGNOSIS — E162 Hypoglycemia, unspecified: Secondary | ICD-10-CM | POA: Diagnosis present

## 2020-04-07 DIAGNOSIS — F1010 Alcohol abuse, uncomplicated: Secondary | ICD-10-CM | POA: Diagnosis not present

## 2020-04-07 DIAGNOSIS — D649 Anemia, unspecified: Secondary | ICD-10-CM

## 2020-04-07 DIAGNOSIS — T451X5A Adverse effect of antineoplastic and immunosuppressive drugs, initial encounter: Secondary | ICD-10-CM | POA: Diagnosis present

## 2020-04-07 DIAGNOSIS — K922 Gastrointestinal hemorrhage, unspecified: Secondary | ICD-10-CM | POA: Diagnosis not present

## 2020-04-07 DIAGNOSIS — Z853 Personal history of malignant neoplasm of breast: Secondary | ICD-10-CM

## 2020-04-07 DIAGNOSIS — K2961 Other gastritis with bleeding: Secondary | ICD-10-CM | POA: Diagnosis not present

## 2020-04-07 DIAGNOSIS — R4182 Altered mental status, unspecified: Secondary | ICD-10-CM | POA: Diagnosis present

## 2020-04-07 DIAGNOSIS — G9340 Encephalopathy, unspecified: Secondary | ICD-10-CM | POA: Diagnosis present

## 2020-04-07 DIAGNOSIS — K2980 Duodenitis without bleeding: Secondary | ICD-10-CM | POA: Diagnosis not present

## 2020-04-07 LAB — CBC WITH DIFFERENTIAL/PLATELET
Abs Immature Granulocytes: 0.02 10*3/uL (ref 0.00–0.07)
Basophils Absolute: 0 10*3/uL (ref 0.0–0.1)
Basophils Relative: 0 %
Eosinophils Absolute: 0 10*3/uL (ref 0.0–0.5)
Eosinophils Relative: 0 %
HCT: 14.4 % — ABNORMAL LOW (ref 36.0–46.0)
Hemoglobin: 4.9 g/dL — CL (ref 12.0–15.0)
Immature Granulocytes: 1 %
Lymphocytes Relative: 51 %
Lymphs Abs: 1 10*3/uL (ref 0.7–4.0)
MCH: 38.9 pg — ABNORMAL HIGH (ref 26.0–34.0)
MCHC: 34 g/dL (ref 30.0–36.0)
MCV: 114.3 fL — ABNORMAL HIGH (ref 80.0–100.0)
Monocytes Absolute: 0.4 10*3/uL (ref 0.1–1.0)
Monocytes Relative: 19 %
Neutro Abs: 0.6 10*3/uL — ABNORMAL LOW (ref 1.7–7.7)
Neutrophils Relative %: 29 %
Platelets: 37 10*3/uL — ABNORMAL LOW (ref 150–400)
RBC: 1.26 MIL/uL — ABNORMAL LOW (ref 3.87–5.11)
RDW: 16 % — ABNORMAL HIGH (ref 11.5–15.5)
WBC: 2 10*3/uL — ABNORMAL LOW (ref 4.0–10.5)
nRBC: 0 % (ref 0.0–0.2)

## 2020-04-07 LAB — COMPREHENSIVE METABOLIC PANEL
ALT: 84 U/L — ABNORMAL HIGH (ref 0–44)
AST: 123 U/L — ABNORMAL HIGH (ref 15–41)
Albumin: 2 g/dL — ABNORMAL LOW (ref 3.5–5.0)
Alkaline Phosphatase: 138 U/L — ABNORMAL HIGH (ref 38–126)
Anion gap: 12 (ref 5–15)
BUN: 7 mg/dL (ref 6–20)
CO2: 22 mmol/L (ref 22–32)
Calcium: 8 mg/dL — ABNORMAL LOW (ref 8.9–10.3)
Chloride: 101 mmol/L (ref 98–111)
Creatinine, Ser: 1.08 mg/dL — ABNORMAL HIGH (ref 0.44–1.00)
GFR, Estimated: 60 mL/min — ABNORMAL LOW (ref >60)
Glucose, Bld: 92 mg/dL (ref 70–99)
Potassium: 3.2 mmol/L — ABNORMAL LOW (ref 3.5–5.1)
Sodium: 135 mmol/L (ref 135–145)
Total Bilirubin: 0.6 mg/dL (ref 0.3–1.2)
Total Protein: 6 g/dL — ABNORMAL LOW (ref 6.5–8.1)

## 2020-04-07 LAB — ETHANOL: Alcohol, Ethyl (B): <10 mg/dL (ref <10)

## 2020-04-07 LAB — SALICYLATE LEVEL: Salicylate Lvl: <7 mg/dL — ABNORMAL LOW (ref 7.0–30.0)

## 2020-04-07 LAB — PREPARE RBC (CROSSMATCH)

## 2020-04-07 LAB — POC OCCULT BLOOD, ED: Fecal Occult Bld: NEGATIVE

## 2020-04-07 LAB — ACETAMINOPHEN LEVEL: Acetaminophen (Tylenol), Serum: <10 ug/mL — ABNORMAL LOW (ref 10–30)

## 2020-04-07 LAB — ABO/RH: ABO/RH(D): O POS

## 2020-04-07 IMAGING — CT CT HEAD W/O CM
4 series · 16 of 47 positions shown, 18 images · non-contrast
Comparison: [DATE]

CLINICAL DATA: Confusion x2 days.  History of stage IV lung cancer.

EXAM:
CT HEAD WITHOUT CONTRAST
TECHNIQUE: Contiguous axial images were obtained from the base of the skull
through the vertex without intravenous contrast.

[Series 3: head without · axial · non-contrast · 0.39mm/px · z∈[-112,+3]mm · 7 of 31 slices shown, 9 images]
[im 4/31  brain]
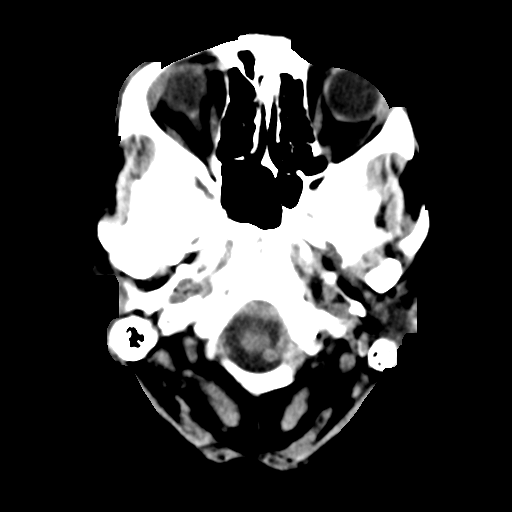
[im 4/31  bone]
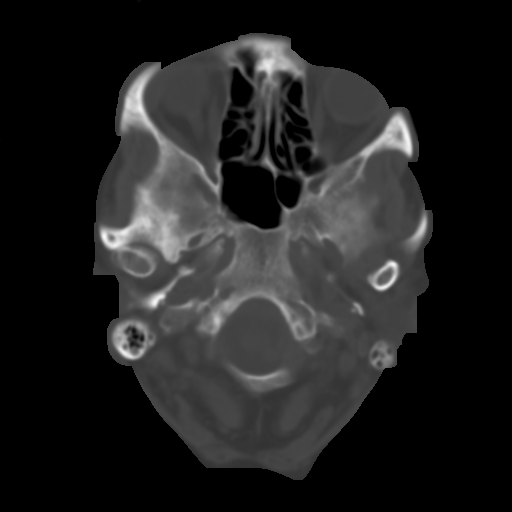
[im 8/31  brain]
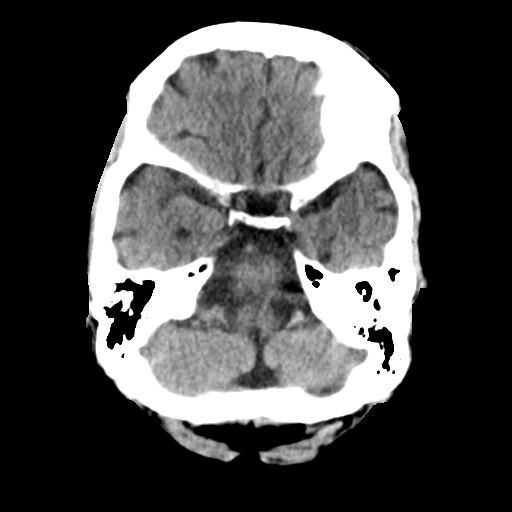
[im 12/31  brain]
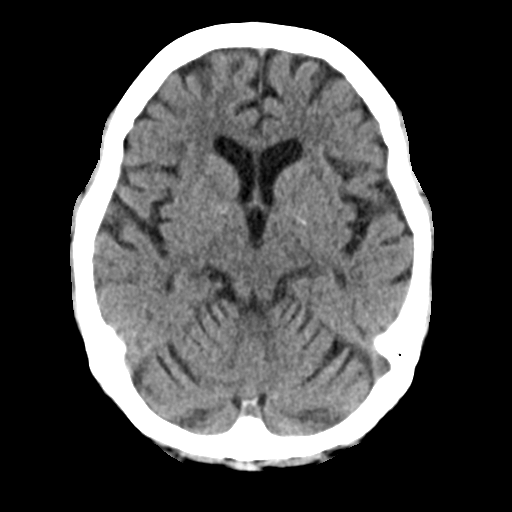
[im 16/31  brain]
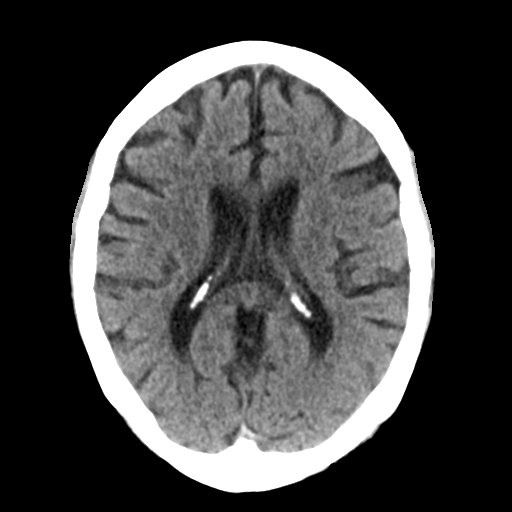
[im 19/31  brain]
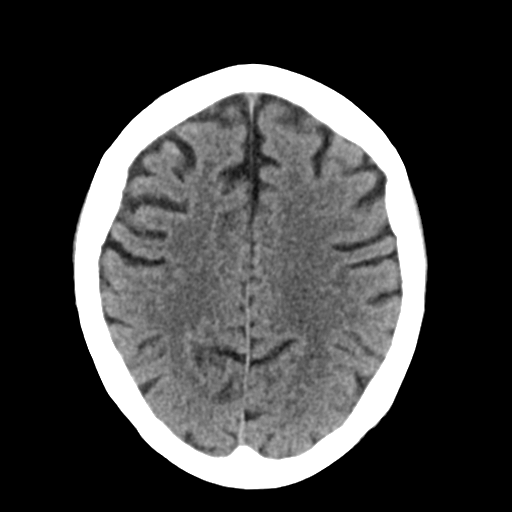
[im 19/31  bone]
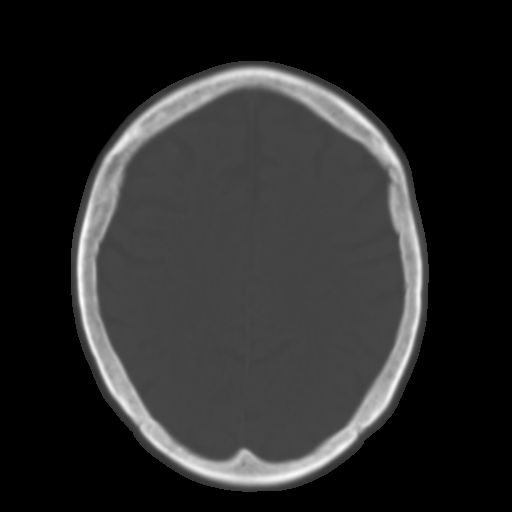
[im 23/31  brain]
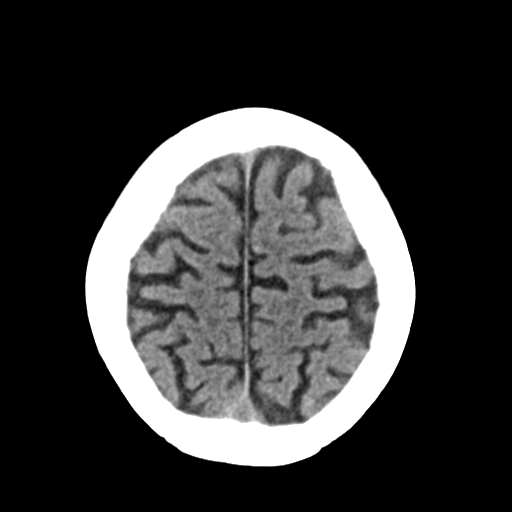
[im 27/31  brain]
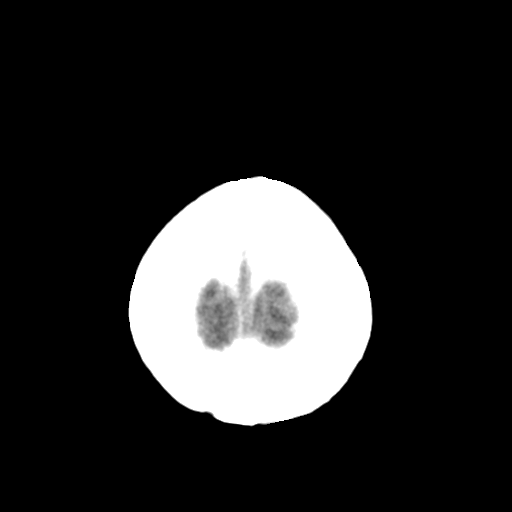

[Series 4: head bone · axial · 0.39mm/px · z∈[-113,-83]mm · 3 of 76 slices shown]
[im 8/76  bone]
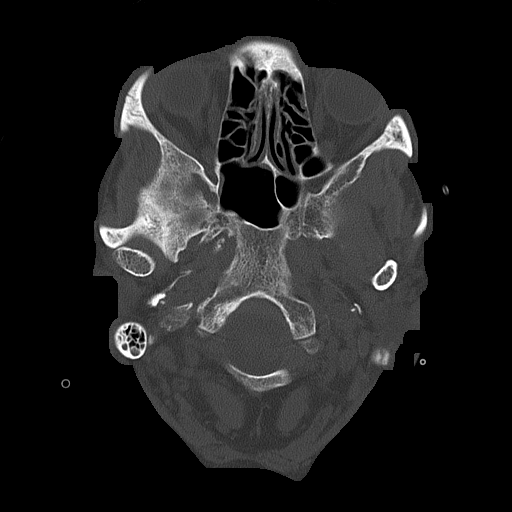
[im 16/76  bone]
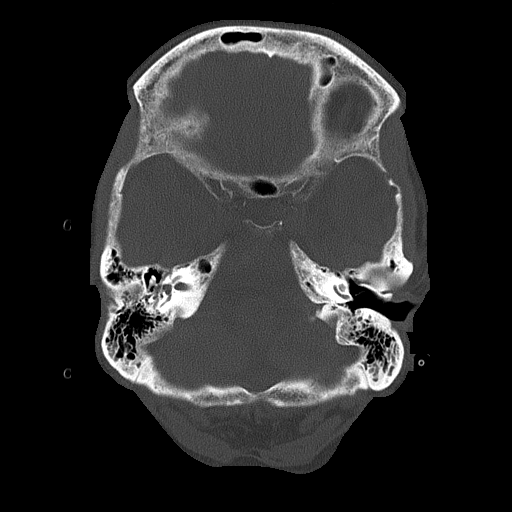
[im 23/76  bone]
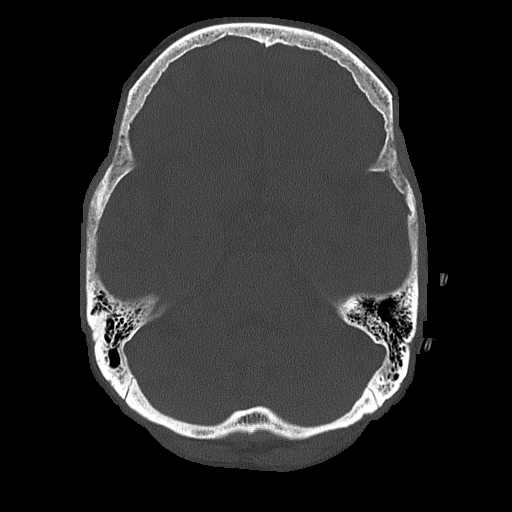

[Series 5: head without cor · coronal · non-contrast · 0.32mm/px · 3 of 67 slices shown]
[im 23/67  brain]
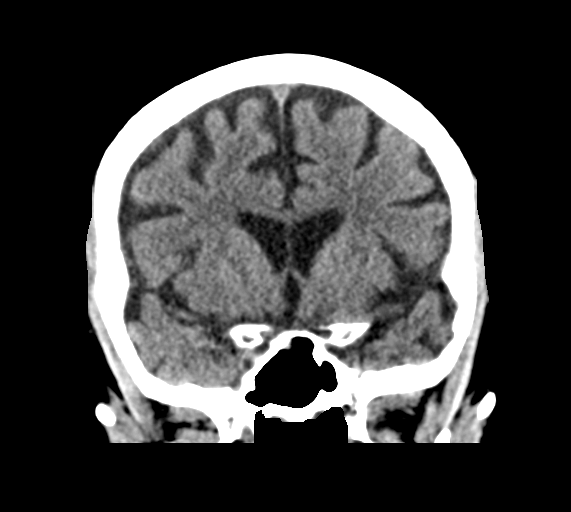
[im 30/67  brain]
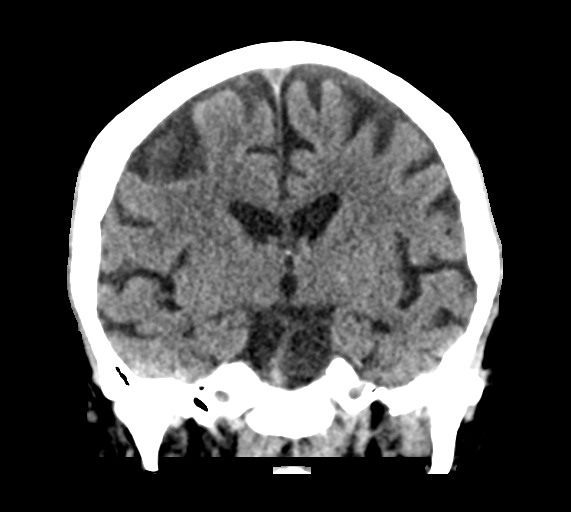
[im 37/67  brain]
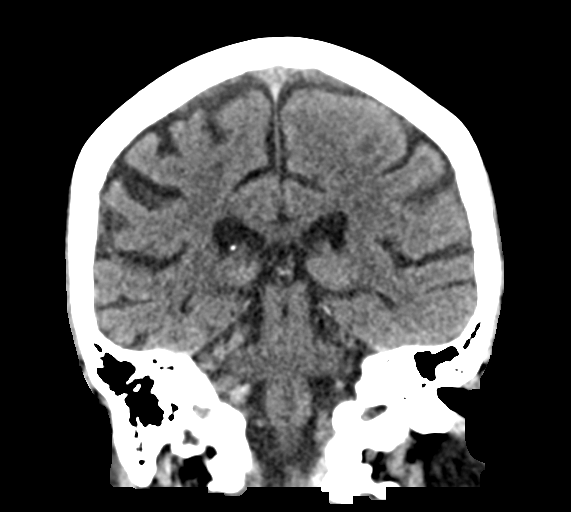

[Series 6: head without sag · sagittal · non-contrast · 0.34mm/px · 3 of 59 slices shown]
[im 20/59  brain]
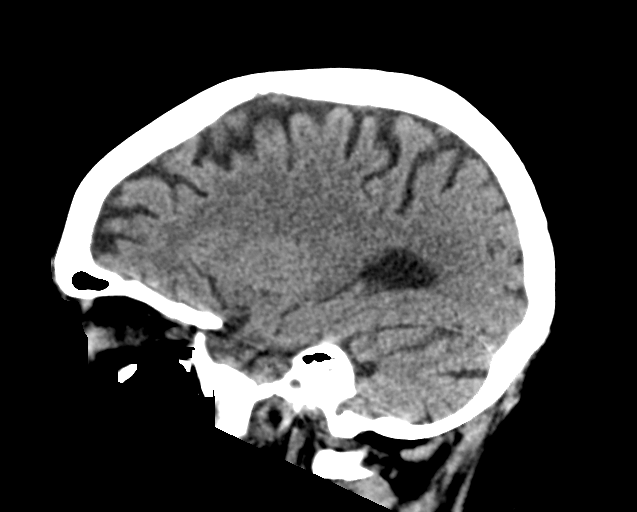
[im 30/59  brain]
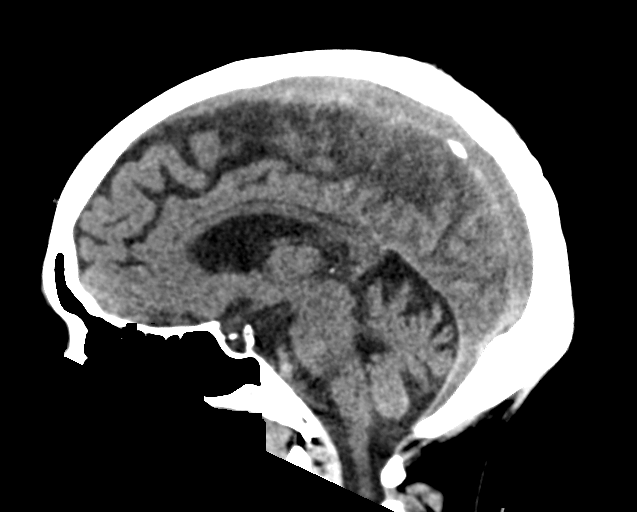
[im 39/59  brain]
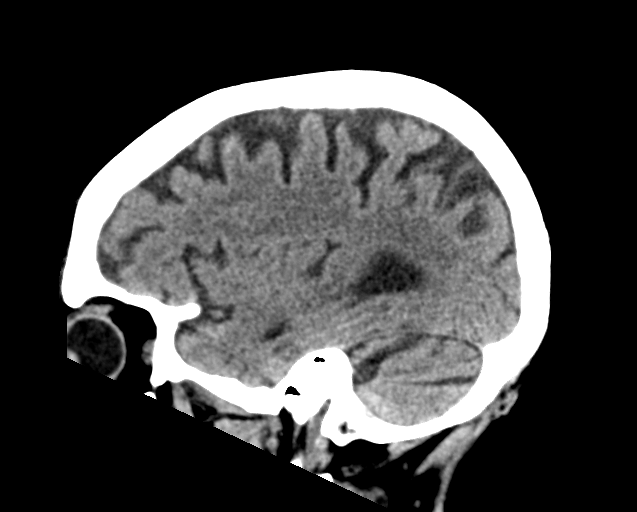

[16 of 47 positions shown; findings below may reference images not displayed]

FINDINGS: Brain: There is mild cerebral atrophy with widening of the
extra-axial spaces and ventricular dilatation.
There are areas of decreased attenuation within the white matter
tracts of the supratentorial brain, consistent with microvascular
disease changes.

Vascular: No hyperdense vessel or unexpected calcification.

Skull: Normal. Negative for fracture or focal lesion.

Sinuses/Orbits: There is mild left ethmoid sinus mucosal thickening.

Other: None.
IMPRESSION: 1. Generalized cerebral atrophy.
2. No acute intracranial abnormality.
3. Mild left ethmoid sinus disease.

## 2020-04-07 MED ORDER — SODIUM CHLORIDE 0.9 % IV SOLN
8.0000 mg/h | INTRAVENOUS | Status: DC
  Administered 2020-04-07 – 2020-04-10 (×6): 8 mg/h via INTRAVENOUS
  Filled 2020-04-07 (×10): qty 80

## 2020-04-07 MED ORDER — SODIUM CHLORIDE 0.9 % IV SOLN
50.0000 ug/h | INTRAVENOUS | Status: DC
  Administered 2020-04-07 – 2020-04-10 (×6): 50 ug/h via INTRAVENOUS
  Filled 2020-04-07 (×10): qty 1

## 2020-04-07 MED ORDER — PANTOPRAZOLE SODIUM 40 MG IV SOLR: 40.0000 mg | Freq: Two times a day (BID) | INTRAVENOUS | Status: DC

## 2020-04-07 MED ORDER — LORAZEPAM 2 MG/ML IJ SOLN
1.0000 mg | INTRAMUSCULAR | Status: DC | PRN
  Administered 2020-04-08: 22:00:00 2 mg via INTRAVENOUS
  Filled 2020-04-07: qty 1

## 2020-04-07 MED ORDER — PANTOPRAZOLE SODIUM 40 MG IV SOLR: 40.0000 mg | Freq: Once | INTRAVENOUS | Status: DC

## 2020-04-07 MED ORDER — SODIUM CHLORIDE 0.9 % IV SOLN
80.0000 mg | Freq: Once | INTRAVENOUS | Status: AC
  Administered 2020-04-07: 20:00:00 80 mg via INTRAVENOUS
  Filled 2020-04-07: qty 80

## 2020-04-07 MED ORDER — SODIUM CHLORIDE 0.9 % IV SOLN
1.0000 g | INTRAVENOUS | Status: DC
  Administered 2020-04-08 – 2020-04-10 (×3): 1 g via INTRAVENOUS
  Filled 2020-04-07 (×3): qty 10

## 2020-04-07 MED ORDER — POTASSIUM CHLORIDE 10 MEQ/100ML IV SOLN
10.0000 meq | INTRAVENOUS | Status: AC
  Administered 2020-04-07 – 2020-04-08 (×4): 10 meq via INTRAVENOUS
  Filled 2020-04-07 (×4): qty 100

## 2020-04-07 MED ORDER — LORAZEPAM 1 MG PO TABS
1.0000 mg | ORAL_TABLET | ORAL | Status: DC | PRN
  Administered 2020-04-09: 1 mg via ORAL
  Filled 2020-04-07: qty 1

## 2020-04-07 MED ORDER — SODIUM CHLORIDE 0.9 % IV BOLUS
500.0000 mL | Freq: Once | INTRAVENOUS | Status: AC
  Administered 2020-04-07: 500 mL via INTRAVENOUS

## 2020-04-07 MED ORDER — SODIUM CHLORIDE 0.9 % IV SOLN: 10.0000 mL/h | Freq: Once | INTRAVENOUS | Status: DC

## 2020-04-07 MED ORDER — OCTREOTIDE LOAD VIA INFUSION
50.0000 ug | Freq: Once | INTRAVENOUS | Status: AC
  Administered 2020-04-07: 50 ug via INTRAVENOUS
  Filled 2020-04-07: qty 25

## 2020-04-07 MED ORDER — THIAMINE HCL 100 MG/ML IJ SOLN
Freq: Once | INTRAVENOUS | Status: AC
  Filled 2020-04-07: qty 1000

## 2020-04-07 MED ORDER — SODIUM CHLORIDE 0.9 % IV SOLN
2.0000 g | Freq: Once | INTRAVENOUS | Status: AC
  Administered 2020-04-07: 2 g via INTRAVENOUS
  Filled 2020-04-07: qty 20

## 2020-04-07 NOTE — ED Provider Notes (Signed)
Medical screening examination/treatment/procedure(s) were conducted as a shared visit with non-physician practitioner(s) and myself.  I personally evaluated the patient during the encounter. Briefly, the patient is a 60 y.o. female here for confusion.  Supposedly may be some crack cocaine today.  Heavy alcohol drinker.  Currently undergoing chemotherapy for lung cancer.  Last chemo session was about 2 weeks ago.  Lab work significant for hemoglobin of 4.9.  She has melena on exam.  She states she has noticed dark stools here in the last 2 days.  Overall she is pancytopenic.  This could also be secondary to ongoing oncology process but given her history of severe drinking and will start Protonix.  We will give her 3 units of packed red blood cells.  Otherwise lab work is unremarkable.  Will admit to medicine for further care.  We will touch base with GI.  This chart was dictated using voice recognition software.  Despite best efforts to proofread,  errors can occur which can change the documentation meaning.     EKG Interpretation  Date/Time:  Sunday April 07 2020 16:08:18 EST Ventricular Rate:  102 PR Interval:  152 QRS Duration: 70 QT Interval:  354 QTC Calculation: 461 R Axis:   41 Text Interpretation: Sinus tachycardia Low voltage QRS Septal infarct , age undetermined Abnormal ECG Confirmed by Lennice Sites [phone]) on 04/07/2020 4:52:26 PM           Lennice Sites, DO 04/07/20 1902

## 2020-04-07 NOTE — ED Triage Notes (Addendum)
Patient arrived by [facility] from home with complaints of AMS per EMS. On arrival patient alert and oriented. Denies pain. Patient reports that she is receiving chemo for stage 4 cancer. Receiving treatment for lung cancer. Patient seems confused of why she is at hospital. Family reports patient confused x 2 days.  Patient endorses smoking crack with her son today prior to EMS being called

## 2020-04-07 NOTE — ED Notes (Signed)
Pt given cranberry juice and asked to notify staff when she needs to use the restroom

## 2020-04-07 NOTE — ED Notes (Signed)
Octreotide IV medication switched to right chest port.

## 2020-04-07 NOTE — ED Notes (Signed)
Patient admits to smoking crack and that she doesn't feel good.  Patient very confused, pleasant but altered.   Able to follow commands and denies pain.   [id number]

## 2020-04-07 NOTE — ED Notes (Signed)
Blood bank called with update. Patient's antibody test is positive so they are processing her blood. Also due to blood shortage, plan is to transfuse 2 units then recheck H and H before transfusing third unit.

## 2020-04-07 NOTE — H&P (Signed)
History and Physical    PLEASE NOTE THAT DRAGON DICTATION SOFTWARE WAS USED IN THE CONSTRUCTION OF THIS NOTE.   Brenda [name] MHD:[id number] DOB: 12-Dec-1960 DOA: 04/07/2020  PCP: Pcp, No Patient coming from: home   I have personally briefly reviewed patient's old medical records in [facility]  Chief Complaint: confusion   HPI: Brenda [name] is a 60 y.o. female with medical history significant for stage IV adenocarcinoma of the right lung on chemotherapy, chronic anemia with baseline hemoglobin 8.5-10, HIV, chronic alcohol abuse, who is admitted to [facility] on 04/07/2020 with concerned for acute upper GI bleed after presenting from home via EMS to [facility] Emergency Department for evaluation of confusion.   The following history is from EMS questions with the patient as well as from the patient's niece and EMS questions with the emergency department physician and via chart review.  The patient's family reports that she is exhibited confusion relative to her baseline mental status over the course of the last, with patient's niece noting that at baseline, patient is alert and oriented x4 and without any significant confusion.  Family knowledges that the patient "smoked crack" earlier in the day on 04/07/2020, although they feel that she is exhibiting evidence of confusion prior to that.  They deny knowledge of any recent fever, chills, and reports that the patient has not been recently complaining of any shortness of breath or cough.  No recent traveling or known COVID-19 exposures.  They do not believe that she has ever undergone colonoscopy.  Family is also able to confirm patient has a history of lung cancer for which she has been undergoing chemotherapy, most recently approximately 2 weeks ago.  Family does not believe the patient is on any blood thinners as an outpatient, including no aspirin.  They also do not believe that she has been using any recent NSAIDs.  They report that she  has a history of chronic alcohol abuse, stating that she typically consumes 12-16 beers per day, although it is unclear as to why it her most recent consumption of alcohol occurred.  Family does not believe the patient has undergone any recent trauma including no recent falls.   The patient reports that she "has just not been feeling well" over the last few days, although she has difficulty further qualifying this.  She reports generalized abdominal discomfort that she states is new for her, although she is not able to further narrow the timeframe for onset of this.  She denies any recent melena, hematochezia, or diarrhea.  She also denies any recent dysuria, gross hematuria, or change in urinary urgency/frequency. Reports a decline in her appetite over the last 3 to 4 days resulting in diminished oral intake over that time, but denies any associated nausea or vomiting.  She denies any recent chest pain, shortness of breath, palpitations, dizziness, or diaphoresis.  Per chart review, it appears that the patient has a history of chronic anemia with baseline hemoglobin 8.5-10.  Per chart review, in an encounter today established diagnosis of liver disease.      ED Course:  Vital signs in the ED were notable for the following: Temperature max 98.4; heart rate 85-1 03; blood pressures range from 91/59 - 121/77; respiratory rate 16-19; oxygen saturation 100% on room air.  Labs were notable for the following: CMP was notable for the following: Sodium 135, potassium 3.2, bicarbonate 22, BUN 7 relative to most recent prior BUN value of 5 on 03/27/2020, creatinine 1.08, glucose  92; liver enzymes were also notable for the following, with comparison to most recent prior associated values on 03/27/2020: alkaline phosphatase 138, relative to most recent prior value of 239, AST 123 relative to most recent prior value of 115, ALT 84, relative to most recent prior value of 78, and total bilirubin 0.6.  CBC was notable for  the following: White blood cell count of 2000 with absolute neutrophil count of 600 relative to most recent prior wbc of 3900 on 03/27/2020, hemoglobin 4.9 relative to 8.7 on 03/27/2020, with this evening's hemoglobin associated with the following: MCV 114, MCHC 34, and RDW 16; this evening CBC also reflected platelet count of 37 relative to most recent prior value of 443.  TSH 2.175.  Urinalysis as well as urinary drug screen been ordered, with results currently pending.  Routine screening COVID-19 test has been ordered, with results currently pending.  Noncontrast CT of the head showed no evidence of acute intracranial process.  The patient's case was discussed by the emergency department physician assistant, Providence Lanius, with the on-call gastroenterologist, Dr. Lyndel Safe of [place] [facility].  In the context of presenting concern for acute GI bleed and reported history of chronic alcohol abuse with unclear existing diagnosis of liver disease, Dr. Lyndel Safe recommended empiric initiation of octreotide drip, Protonix drip.  He also recommended initiation of Rocephin for SBP prophylaxis. Dr. Lyndel Safe will formally consult on this patient, planning to see her in the morning for assessment of endoscopic evaluation, and requested that the patient be kept n.p.o. after midnight.  While in the ED, the following were administered: Octreotide drip was started, Protonix 40 mg IV x1 followed by initiation of Protonix drip, normal saline x500 cc bolus x1, and Rocephin 2 g IV x1.  Additionally, the patient was typed and screened for 3 units PRBC, with transfusion of first unit initiated.    Review of Systems: As per HPI otherwise 10 point review of systems negative.   Past Medical History:  Diagnosis Date  . Breast cancer ([place])   . Depression   . Mental disorder     Past Surgical History:  Procedure Laterality Date  . IR IMAGING GUIDED PORT INSERTION  11/03/2019  . TUBAL LIGATION    . VIDEO BRONCHOSCOPY WITH ENDOBRONCHIAL  NAVIGATION N/A 04/04/2019   Procedure: VIDEO BRONCHOSCOPY WITH ENDOBRONCHIAL NAVIGATION;  Surgeon: Garner Nash, DO;  Location: [facility];  Service: Thoracic;  Laterality: N/A;  . VIDEO BRONCHOSCOPY WITH ENDOBRONCHIAL ULTRASOUND N/A 04/04/2019   Procedure: VIDEO BRONCHOSCOPY WITH ENDOBRONCHIAL ULTRASOUND;  Surgeon: Garner Nash, DO;  Location: [facility];  Service: Thoracic;  Laterality: N/A;    Social History:  reports that she quit smoking about 10 months ago. Her smoking use included cigarettes. She has a 15.00 pack-year smoking history. She has never used smokeless tobacco. She reports current alcohol use of about 84.0 standard drinks of alcohol per week. She reports current drug use. Drug: Cocaine.   Allergies  Allergen Reactions  . Aspirin Adult Low [Aspirin]     Stomach upset    History reviewed. No pertinent family history.   Prior to Admission medications   Medication Sig Start Date End Date Taking? Authorizing Provider  calcium carbonate (OSCAL) 1500 (600 Ca) MG TABS tablet Take 600 mg by mouth daily.    [provider]  folic acid (FOLVITE) 1 MG tablet Take 1 tablet (1 mg total) by mouth daily. 09/22/19   Curt Bears, MD  lidocaine-prilocaine (EMLA) cream Apply 1 application topically as needed.  10/19/19   Heilingoetter, Cassandra L, PA-C  potassium chloride SA (KLOR-CON) 20 MEQ tablet Take 1 tablet (20 mEq total) by mouth daily. 03/06/20   Curt Bears, MD  prochlorperazine (COMPAZINE) 10 MG tablet Take 1 tablet (10 mg total) by mouth every 6 (six) hours as needed for nausea or vomiting. 01/17/20   Heilingoetter, Cassandra L, PA-C     Objective    Physical Exam: Vitals:   04/07/20 1845 04/07/20 1915 04/07/20 1945 04/07/20 2015  BP: (!) 127/99 105/74 (!) 91/59 106/90  Pulse: 95 (!) 103 95 100  Resp: (!) 27 (!) 21 10 (!) 22  Temp:      TempSrc:      SpO2: 100% 100% 100% 100%    General: appears to be stated age; alert; she is oriented to self and is  able to correctly identify her date of birth; she believes that the current date is September 01, 2020 and that she is currently [facility]; she is able to correctly identify the current [place] president. Skin: warm, dry, no rash Head:  AT/[place] Mouth:  Oral mucosa membranes appear dry, normal dentition Neck: supple; trachea midline Heart:  RRR; did not appreciate any M/R/G Lungs: CTAB, did not appreciate any wheezes, rales, or rhonchi Abdomen: + BS; soft, ND, NT Extremities: no peripheral edema, no muscle wasting Neuro: strength and sensation intact in upper and lower extremities b/l   Labs on Admission: I have personally reviewed following labs and imaging studies  CBC: Recent Labs  Lab 04/07/20 1738  WBC 2.0*  NEUTROABS 0.6*  HGB 4.9*  HCT 14.4*  MCV 114.3*  PLT 37*   Basic Metabolic Panel: Recent Labs  Lab 04/07/20 1738  NA 135  K 3.2*  CL 101  CO2 22  GLUCOSE 92  BUN 7  CREATININE 1.08*  CALCIUM 8.0*   GFR: Estimated Creatinine Clearance: 45.6 mL/min (A) (by C-G formula based on SCr of 1.08 mg/dL (H)). Liver Function Tests: Recent Labs  Lab 04/07/20 1738  AST 123*  ALT 84*  ALKPHOS 138*  BILITOT 0.6  PROT 6.0*  ALBUMIN 2.0*   No results for input(s): LIPASE, AMYLASE in the last 168 hours. No results for input(s): AMMONIA in the last 168 hours. Coagulation Profile: No results for input(s): INR, PROTIME in the last 168 hours. Cardiac Enzymes: No results for input(s): CKTOTAL, CKMB, CKMBINDEX, TROPONINI in the last 168 hours. BNP (last 3 results) No results for input(s): PROBNP in the last 8760 hours. HbA1C: No results for input(s): HGBA1C in the last 72 hours. CBG: No results for input(s): GLUCAP in the last 168 hours. Lipid Profile: No results for input(s): CHOL, HDL, LDLCALC, TRIG, CHOLHDL, LDLDIRECT in the last 72 hours. Thyroid Function Tests: No results for input(s): TSH, T4TOTAL, FREET4, T3FREE, THYROIDAB in the last 72 hours. Anemia  Panel: No results for input(s): VITAMINB12, FOLATE, FERRITIN, TIBC, IRON, RETICCTPCT in the last 72 hours. Urine analysis:    Component Value Date/Time   COLORURINE YELLOW 05/25/2012 1958   APPEARANCEUR CLEAR 05/25/2012 1958   LABSPEC 1.008 05/25/2012 1958   PHURINE 5.5 05/25/2012 1958   GLUCOSEU NEGATIVE 05/25/2012 1958   HGBUR NEGATIVE 05/25/2012 1958   BILIRUBINUR NEGATIVE 05/25/2012 1958   KETONESUR TRACE (A) 05/25/2012 1958   PROTEINUR NEGATIVE 05/25/2012 1958   UROBILINOGEN 0.2 05/25/2012 1958   NITRITE POSITIVE (A) 05/25/2012 1958   LEUKOCYTESUR NEGATIVE 05/25/2012 1958    Radiological Exams on Admission: CT Head Wo Contrast  Result Date: 04/07/2020 CLINICAL DATA:  Confusion x2 days.  History of stage IV lung cancer. EXAM: CT HEAD WITHOUT CONTRAST TECHNIQUE: Contiguous axial images were obtained from the base of the skull through the vertex without intravenous contrast. COMPARISON:  September 11, 2019 FINDINGS: Brain: There is mild cerebral atrophy with widening of the extra-axial spaces and ventricular dilatation. There are areas of decreased attenuation within the white matter tracts of the supratentorial brain, consistent with microvascular disease changes. Vascular: No hyperdense vessel or unexpected calcification. Skull: Normal. Negative for fracture or focal lesion. Sinuses/Orbits: There is mild left ethmoid sinus mucosal thickening. Other: None. IMPRESSION: 1. Generalized cerebral atrophy. 2. No acute intracranial abnormality. 3. Mild left ethmoid sinus disease. Electronically Signed   By: Virgina Norfolk M.D.   On: 04/07/2020 17:19     EKG: Independently reviewed, with result as described above.  Sinus tachycardia with heart rate 102, potential Q waves in V2, and no evidence of T wave or ST changes, Including no evidence of ST elevation.   Assessment/Plan   Amylia Collazos is a 60 y.o. female with medical history significant for stage IV adenocarcinoma of the right lung on  chemotherapy, chronic anemia with baseline hemoglobin 8.5-10, HIV, chronic alcohol abuse, who is admitted to [facility] on 04/07/2020 with concerned for acute upper GI bleed after presenting from home via EMS to [facility] Emergency Department for evaluation of confusion.    Principal Problem:   Acute upper GI bleed Active Problems:   Alcohol abuse   Hypokalemia   Acute on chronic anemia   Acute encephalopathy   Pancytopenia (HCC)   Polysubstance abuse (HCC)       #) Acute Upper GI Bleed: Concern for presenting acute upper GI bleed in the setting of acute on chronic anemia, with rectal exam performed in the ED revealing melena, in the context of a slight interval increase in BUN.  Specifically, relative to baseline hemoglobin of 8.5-10, presenting hemoglobin noted to be 4.9, as further discussed below.while the patient does not appear to be on any blood thinners as an outpatient, and no reported NSAID use, there is concern for potential previously undiagnosed liver disease in the context of reported history of chronic alcohol abuse, with the latter increasing the patient's risk for sources for acute upper gastrointestinal bleed include esophagitis, gastritis, peptic ulcer disease, esophageal varices, portal hypertensive gastropathy.  No reported prior endoscopic evaluation.  At this time, the patient appears asymptomatic and hemodynamically stable.  She has been typed and screened for 3 units PRBC, with transfusion of her associated initiated.  Of note, no evidence of hematemesis.  with the on-call gastroenterologist, Dr. Lyndel Safe of [place].  In the context of presenting concern for acute GI bleed and reported history of chronic alcohol abuse with unclear existing diagnosis of liver disease, Dr. Lyndel Safe recommended empiric initiation of octreotide drip, Protonix drip.  He also recommended initiation of Rocephin for SBP prophylaxis. Dr. Lyndel Safe will formally consult on this patient, planning to see her  in the morning for assessment of endoscopic evaluation, and requested that the patient be kept n.p.o. after midnight.  For these recommendations, octreotide and Protonix drips have been initiated, and she has received her first dose of Rocephin.       Plan: NPO. Refraining from pharmacologic DVT prophylaxis. Monitor on telemetry. Maintain at least 2 large bore IV's.  Add on INR and repeat INR in the AM. Q4H H&H's have been ordered through 9 AM on 04/08/2020.  Continue transfusion 2 units PRBC, with each unit  ordered to be transfused over 2 hours.  Repeat H&H has been ordered to be rechecked following completed transfusion of the second unit PRBC. Will closely monitor these ensuing Hgb levels and correlate these data points with the patient's overall clinical picture including vital signs to determine need for subsequent transfusion.  Continue Protonix drip and octreotide drip, per GI recommendations, as above.  Work-up and management of acute on chronic anemia, as further described below.  Repeat CMP in the morning, with evaluation including attention to interval BUN trend.  Work-up and management of chronic alcohol abuse, as below.       #) Acute on chronic anemia: in the setting of a history of chronic anemia associated with baseline hemoglobin of 8.5-10, with differential for her chronic anemia including anemia of chronic disease, chronic alcohol abuse, liver disease, vitamin Y69 deficiency, folic acid deficiency, the patient's presenting hemoglobin is noted to be 4.9 relative to most recent prior value of 8.7 on 03/27/2020.  Presenting hemoglobin is associated with macrocytic and normochromic findings, along with a mildly elevated RDW of 16, which is actually trending down relative to most recent prior RDW value on 03/27/2020.  Differential for the acute exacerbation of patient's chronic anemia includes potential acute blood loss anemia due to possible acute upper gastrointestinal bleed, as further  described above versus as a complication of interval chemotherapy, particular in the setting of presenting pancytopenia.  She is currently asymptomatic and appears hemodynamically stable, as further quantified above.  Currently receiving transfusion of 2 units PRBC, as further described above.  Additionally, gastroenterology has been formally consulted, as further described above.     Plan: work-up and management for presenting suspected acute upper GI bleed, as above, including close monitoring of Q4H H&H's, with clinical evaluation for determination of need for additional blood transfusion, as further described above. Monitor on telemetry. NPO. Refraining from pharmacologic DVT prophylaxis.  Add on INR and recheck in the morning.  Add on the following to pretransfusion labs collected in the ED today: Total iron, TIBC, ferritin, MMA, folic acid level, reticulocyte count, and peripheral smear.  Gastroenterology formally consulted, with plan for assessment for endoscopic evaluation in the morning of 04/08/20, as further described above.     #) Acute encephalopathy: Per the patient's family, she is exhibited approximately 1 day of confusion relative to her baseline mental status at which she is reportedly alert and oriented x4.  Per my evaluation of her this evening, the patient appears  Alert but slightly confused, as further detailed above.  Potential metabolic source in the setting of acute on chronic anemia, as above.  Additionally, suspect contribution from recreational drug use given report of recent use of cocaine, as above.  No obvious underlying infectious process at this time, although urinalysis and COVID screen are currently pending.  No acute focal neurologic deficits to suggest a contribution from underlying acute CVA, low presenting noncontrast CT that shows no evidence of acute intracranial process.  Seizures are also felt to be unlikely.  TSH was checked in the ED and found to be within normal  limits.   Plan: Follow-up for results of urinalysis as well as urinary drug screen.  Follow-up for results of COVID-19 screening.  Repeat CMP and CBC in the morning.  Given the patient's reported history of chronic alcohol abuse, will check ammonia level as well as vitamin B12 level..  Check VBG to evaluate for any contribution from hypercapnic encephalopathy.       #) Acute on chronic  pancytopenia: In the setting of chronic leukopenia with white blood cell count range of 1.5-3.9 dating back to the end of October 2021, her presenting white blood cell count of 2.0 appears consistent with this range, although lower than most recent prior value 3.9 on 03/27/2020.  Of note, patient's leukopenia is associated with neutropenia, with ANC of 600.  No evidence of associated fever, although we will closely monitor subsequent development of such, particularly in the setting of a history of chemotherapy, as above.  No evidence of underlying infectious process at this time, although urinalysis and COVID screen are currently pending. Additionally, in the context of intermittent thrombocytopenia dating back to July 2021, presenting platelets found to be 37 relative to most recent prior value 443.  Also, as further discussed above, this evening's presentation is also associated with acute on chronic anemia, and the setting of concern for potential acute upper gastrointestinal bleed.  Differential for her chronic pancytopenia includes potential previously undiagnosed liver disease in the setting of reported chronic alcohol abuse versus contributions from ongoing chemotherapy in the context of stage IV adenocarcinoma of the right lung.   Plan: Work-up and management of acute on chronic anemia, including PRBC transfusion as well as gastroenterology consult/evaluation, as further described above, including every 4 hour H&H trending through 9 AM on 04/08/2020.  Add on INR and recheck in the morning.  Repeat CBC in the morning.   Avoiding pharmacologic DVT prophylaxis for now.  SCDs.  Additional evaluation and management of reported chronic alcohol abuse, as further described below.  Add on peripheral smear to pretransfusion labs.  Close monitoring for development of fever.     #) Hypokalemia: Presenting serum potassium level noted to be slightly low at 3.2.  There appears to be an element of chronicity to the patient's mild hypokalemia, as she is reportedly on potassium chloride 20 mEq PO daily.  Plan: Potassium chloride 40 mill colons IV over 4 hours x 1 now.  Add on serum magnesium level.  Repeat CMP in the morning.      #) Chronic alcohol abuse: The patient's niece reports that the patient typically consumes 12-16 beers on a daily basis for at least the last several months, although it is currently unclear as to the timing of her most recent consumption of alcohol.  Of note, serum ethanol level checked in the ED was found to be less than 10. does not appear to be actively withdrawing from alcohol at this time, although will monitor closely for subsequent development of evidence of such.  In the context of a reported history of chronic alcohol abuse, evaluation for presenting acute on chronic anemia includes evaluation for vitamin J69 and folic acid deficiency, as further described above.   Plan: Banana bag x1, with plan to reevaluate the patient's ability to take p.o. tomorrow in order to determine route of administration of subsequent thiamine, folic acid, and multivitamin.  Add on serum magnesium level.  Check serum phosphorus level.  CIWA protocol has been initiated.  Following improvement of patient's mental status, anticipate counseling the patient on the importance of reduction of her alcohol consumption.  Have also placed consult with the transition of care team.       #) Polysubstance abuse: The patient's family reports that the patient routinely smokes marijuana and that she also "smoked crack cocaine"  earlier in the day on 04/07/2020.  Result of urinary drug screen is currently pending.  Plan: Monitor result of urinary drug screen.  Anticipate subsequent counseling  on the importance of refraining from recreational drug use.  As stated above, a consult has been placed with the transition of care team.     DVT prophylaxis: SCDs Code Status: Full code Family Communication: Patient's case was discussed with her niece, as further detailed above. Disposition Plan: Per Rounding Team Consults called: Case was discussed with the on-call gastroenterologist, Dr. Lyndel Safe, as further detailed above. Admission status: Inpatient; pcu     Of note, this patient was added by me to the following Admit List/Treatment Team:  mcadmits     PLEASE NOTE THAT DRAGON DICTATION SOFTWARE WAS USED IN THE CONSTRUCTION OF THIS NOTE.   Rhetta Mura DO [facility] Pager [phone] From [place]   04/07/2020, 8:59 PM

## 2020-04-07 NOTE — ED Notes (Signed)
Urine requested  ?

## 2020-04-07 NOTE — ED Provider Notes (Signed)
[facility] EMERGENCY DEPARTMENT Provider Note   CSN: [id number] Arrival date & time: 04/07/20  1556     History Chief Complaint  Patient presents with  . Altered Mental Status    Brenda [name] is a 60 y.o. female with PMH/o lung cancer with mets who presents for evaluation of "not feeling good."  Patient has difficulty explaining why she states "she felt terrible but now she feels comfortable."  I discussed with patient niece.  She reports that patient is a known alcoholic and drinks about 6-12 beer a day.  She also routinely smokes marijuana.  She had her last night and stated that she had a normal but was complaining of her head hurting.  Per EMS, patient endorsed cocaine crack today with her son.  Patient does state that this occurred.  She denies any other drug use.  Patient denies any chest pain, difficulty breathing, abdominal pain, nausea/vomiting.  LEVEL 5 CAVEAT DUE TO AMS  The history is provided by the patient.       Past Medical History:  Diagnosis Date  . Breast cancer ([place])   . Depression   . Mental disorder     Patient Active Problem List   Diagnosis Date Noted  . Hypokalemia 10/19/2019  . Neutropenia ([place]) 10/05/2019  . Adenocarcinoma of right lung, stage 4 ([place]) 09/14/2019  . Encounter for antineoplastic chemotherapy 09/14/2019  . Encounter for antineoplastic immunotherapy 09/14/2019  . Goals of care, counseling/discussion 09/14/2019  . Tobacco abuse counseling 09/14/2019  . 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 ([place])   . Alcohol abuse   . Acute respiratory failure ([place]) 04/03/2019  . Lung mass   . Multifocal pneumonia   . Major depressive disorder   . Major depressive disorder, recurrent episode with mood-congruent psychotic features ([place]) 05/30/2017  . 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 IR IMAGING GUIDED PORT INSERTION  11/03/2019  . TUBAL  LIGATION    . VIDEO BRONCHOSCOPY WITH ENDOBRONCHIAL NAVIGATION N/A 04/04/2019   Procedure: VIDEO BRONCHOSCOPY WITH ENDOBRONCHIAL NAVIGATION;  Surgeon: Garner Nash, DO;  Location: [facility];  Service: Thoracic;  Laterality: N/A;  . VIDEO BRONCHOSCOPY WITH ENDOBRONCHIAL ULTRASOUND N/A 04/04/2019   Procedure: VIDEO BRONCHOSCOPY WITH ENDOBRONCHIAL ULTRASOUND;  Surgeon: Garner Nash, DO;  Location: [facility];  Service: Thoracic;  Laterality: N/A;     OB History   No obstetric history on file.     History reviewed. No pertinent family history.  Social History   Tobacco Use  . Smoking status: Former Smoker    Packs/day: 0.50    Years: 30.00    Pack years: 15.00    Types: Cigarettes    Quit date: 05/27/2019    Years since quitting: 0.8  . Smokeless tobacco: Never Used  Vaping Use  . Vaping Use: Never used  Substance Use Topics  . Alcohol use: Yes    Alcohol/week: 84.0 standard drinks    Types: 84 Cans of beer per week  . Drug use: Yes    Types: Cocaine    Home Medications Prior to Admission medications   Medication Sig Start Date End Date Taking? Authorizing Provider  calcium carbonate (OSCAL) 1500 (600 Ca) MG TABS tablet Take 600 mg by mouth daily.    [provider]  folic acid (FOLVITE) 1 MG tablet Take 1 tablet (1 mg total) by mouth daily. 09/22/19   Curt Bears, MD  lidocaine-prilocaine (EMLA) cream Apply 1  application topically as needed. 10/19/19   Heilingoetter, Cassandra L, PA-C  potassium chloride SA (KLOR-CON) 20 MEQ tablet Take 1 tablet (20 mEq total) by mouth daily. 03/06/20   Curt Bears, MD  prochlorperazine (COMPAZINE) 10 MG tablet Take 1 tablet (10 mg total) by mouth every 6 (six) hours as needed for nausea or vomiting. 01/17/20   Heilingoetter, Cassandra L, PA-C    Allergies    Aspirin adult low [aspirin]  Review of Systems   Review of Systems  Unable to perform ROS: Mental status change    Physical Exam Updated Vital Signs BP 106/90    Pulse 100   Temp 98.4 F (36.9 C) (Oral)   Resp (!) 27   LMP 01/19/2011   SpO2 100%   Physical Exam Vitals and nursing note reviewed.  Constitutional:      Appearance: Normal appearance. She is well-developed and well-nourished.  HENT:     Head: Normocephalic and atraumatic.     Mouth/Throat:     Mouth: Oropharynx is clear and moist and mucous membranes are normal.  Eyes:     General: Lids are normal.     Extraocular Movements: EOM normal.     Conjunctiva/sclera: Conjunctivae normal.     Pupils: Pupils are equal, round, and reactive to light.     Comments: Pinpoint pupils bilaterally  Cardiovascular:     Rate and Rhythm: Normal rate and regular rhythm.     Pulses: Normal pulses.     Heart sounds: Normal heart sounds. No murmur heard. No friction rub. No gallop.   Pulmonary:     Effort: Pulmonary effort is normal.     Breath sounds: Normal breath sounds.     Comments: Lungs clear to auscultation bilaterally.  Symmetric chest rise.  No wheezing, rales, rhonchi. Abdominal:     Palpations: Abdomen is soft. Abdomen is not rigid.     Tenderness: There is no abdominal tenderness. There is no guarding.     Comments: The exam was performed with a chaperone present.  Dark melena noted on my digital rectal exam.  No tenderness.  Musculoskeletal:        General: Normal range of motion.     Cervical back: Full passive range of motion without pain.  Skin:    General: Skin is warm and dry.     Capillary Refill: Capillary refill takes less than 2 seconds.  Neurological:     Mental Status: She is alert.     Comments: Alert.  She can name.  Initially she did not know but eventually she said a hospital.  Additionally, she had time of year.  She cannot tell me the president outbreak but when I give her multiple choice options, she could correctly pick up the right choice. Cranial nerves III-XII intact Follows commands, Moves all extremities  5/5 strength to BUE and BLE  Sensation intact  throughout all major nerve distributions No slurred speech. No facial droop.   Psychiatric:        Mood and Affect: Mood and affect normal.        Speech: Speech normal.     ED Results / Procedures / Treatments   Labs (all labs ordered are listed, but only abnormal results are displayed) Labs Reviewed  COMPREHENSIVE METABOLIC PANEL - Abnormal; Notable for the following components:      Result Value   Potassium 3.2 (*)    Creatinine, Ser 1.08 (*)    Calcium 8.0 (*)    Total Protein 6.0 (*)  Albumin 2.0 (*)    AST 123 (*)    ALT 84 (*)    Alkaline Phosphatase 138 (*)    GFR, Estimated 60 (*)    All other components within normal limits  CBC WITH DIFFERENTIAL/PLATELET - Abnormal; Notable for the following components:   WBC 2.0 (*)    RBC 1.26 (*)    Hemoglobin 4.9 (*)    HCT 14.4 (*)    MCV 114.3 (*)    MCH 38.9 (*)    RDW 16.0 (*)    Platelets 37 (*)    Neutro Abs 0.6 (*)    All other components within normal limits  SALICYLATE LEVEL - Abnormal; Notable for the following components:   Salicylate Lvl <4.7 (*)    All other components within normal limits  ACETAMINOPHEN LEVEL - Abnormal; Notable for the following components:   Acetaminophen (Tylenol), Serum <10 (*)    All other components within normal limits  SARS CORONAVIRUS 2 (TAT 6-24 HRS)  ETHANOL  URINALYSIS, ROUTINE W REFLEX MICROSCOPIC  RAPID URINE DRUG SCREEN, HOSP PERFORMED  POC OCCULT BLOOD, ED  TYPE AND SCREEN  PREPARE RBC (CROSSMATCH)  ABO/RH    EKG EKG Interpretation  Date/Time:  Sunday April 07 2020 16:08:18 EST Ventricular Rate:  102 PR Interval:  152 QRS Duration: 70 QT Interval:  354 QTC Calculation: 461 R Axis:   41 Text Interpretation: Sinus tachycardia Low voltage QRS Septal infarct , age undetermined Abnormal ECG Confirmed by Lennice Sites [phone]) on 04/07/2020 4:52:26 PM   Radiology CT Head Wo Contrast  Result Date: 04/07/2020 CLINICAL DATA:  Confusion x2 days.  History of stage  IV lung cancer. EXAM: CT HEAD WITHOUT CONTRAST TECHNIQUE: Contiguous axial images were obtained from the base of the skull through the vertex without intravenous contrast. COMPARISON:  September 11, 2019 FINDINGS: Brain: There is mild cerebral atrophy with widening of the extra-axial spaces and ventricular dilatation. There are areas of decreased attenuation within the white matter tracts of the supratentorial brain, consistent with microvascular disease changes. Vascular: No hyperdense vessel or unexpected calcification. Skull: Normal. Negative for fracture or focal lesion. Sinuses/Orbits: There is mild left ethmoid sinus mucosal thickening. Other: None. IMPRESSION: 1. Generalized cerebral atrophy. 2. No acute intracranial abnormality. 3. Mild left ethmoid sinus disease. Electronically Signed   By: Virgina Norfolk M.D.   On: 04/07/2020 17:19    Procedures .Critical Care Performed by: Volanda Napoleon, PA-C Authorized by: Volanda Napoleon, PA-C   Critical care provider statement:    Critical care time (minutes):  45   Critical care was necessary to treat or prevent imminent or life-threatening deterioration of the following conditions:  Circulatory failure   Critical care was time spent personally by me on the following activities:  Discussions with consultants, evaluation of patient's response to treatment, examination of patient, ordering and performing treatments and interventions, ordering and review of laboratory studies, ordering and review of radiographic studies, pulse oximetry, re-evaluation of patient's condition, obtaining history from patient or surrogate and review of old charts   (including critical care time)  Medications Ordered in ED Medications  0.9 %  sodium chloride infusion (has no administration in time range)  octreotide (SANDOSTATIN) 2 mcg/mL load via infusion 50 mcg (has no administration in time range)    And  octreotide (SANDOSTATIN) 500 mcg in sodium chloride 0.9 % 250  mL (2 mcg/mL) infusion (has no administration in time range)  pantoprazole (PROTONIX) 80 mg in sodium chloride 0.9 % 100  mL IVPB (has no administration in time range)  pantoprazole (PROTONIX) 80 mg in sodium chloride 0.9 % 100 mL (0.8 mg/mL) infusion (has no administration in time range)  pantoprazole (PROTONIX) injection 40 mg (has no administration in time range)  cefTRIAXone (ROCEPHIN) 2 g in sodium chloride 0.9 % 100 mL IVPB (has no administration in time range)  sodium chloride 0.9 % bolus 500 mL (0 mLs Intravenous Stopped 04/07/20 1946)    ED Course  I have reviewed the triage vital signs and the nursing notes.  Pertinent labs & imaging results that were available during my care of the patient were reviewed by me and considered in my medical decision making (see chart for details).    MDM Rules/Calculators/A&P                          60 year old female possible history of lung cancer with mets, hypertension presents for evaluation of altered mental status.  Patient states "I felt terrible but now I am comfortable."  She has difficulty answering direct questions and will often go off topic but is alert.  She will follow commands.  Initially, she could not tell me what year it is or where she was but eventually, she was able to correctly answer.  She cannot alright tell me who the president is but when given multiple choice options, she picks the correct 1.  She does endorse using crack cocaine earlier today.  I also discussed with niece who states that patient drinks about 6-12 beers a day.  She states that she got through last night and states that she was normal but was complaining of headache.  Question of this is drug-induced.  We will plan to check labs, CT head.  CBC shows hgb of 4.9, leukopenia of 2.0. Platelets are 37.  [place] home, ethanol level, salicylate level unremarkable.  CMP shows potassium of 3.2.  CT head normal.   Patient with no on exam.  He has also has leukopenia,  and thrombocytopenia.  We will plan to give her 3 units.  We will plan for admission.  GI will be consulted.  Updated niece on plan. She states that the patient has never had a colonoscopy and has not been seen by a GI doctor.   I have secure messaged Dr. Lyndel Safe ([facility]). He will plan to  Consult in the morning. Recommends clear liquid diet.  NPO after midnight. Also recommends starting protonix, rocephin and octreotide.   Discussed patient with Dr. Velia Meyer (hospitalist) team who accepts patient for admission.   Portions of this note were generated with [employer]. Dictation errors may occur despite best attempts at proofreading.   Final Clinical Impression(s) / ED Diagnoses Final diagnoses:  Anemia, unspecified type  Gastrointestinal hemorrhage, unspecified gastrointestinal hemorrhage type    Rx / DC Orders ED Discharge Orders    None       Desma Mcgregor 04/07/20 2017    Lennice Sites, DO 04/08/20 1558

## 2020-04-07 NOTE — ED Notes (Signed)
Per MD ok to access port.

## 2020-04-08 ENCOUNTER — Inpatient Hospital Stay (HOSPITAL_COMMUNITY): Payer: Medicaid Other

## 2020-04-08 DIAGNOSIS — K921 Melena: Secondary | ICD-10-CM

## 2020-04-08 DIAGNOSIS — D696 Thrombocytopenia, unspecified: Secondary | ICD-10-CM

## 2020-04-08 DIAGNOSIS — D62 Acute posthemorrhagic anemia: Principal | ICD-10-CM

## 2020-04-08 DIAGNOSIS — K2980 Duodenitis without bleeding: Secondary | ICD-10-CM

## 2020-04-08 LAB — BLOOD GAS, VENOUS
Acid-Base Excess: 0.4 mmol/L (ref 0.0–2.0)
Bicarbonate: 25.2 mmol/L (ref 20.0–28.0)
FIO2: 28
O2 Saturation: 19 %
Patient temperature: 36.6
pCO2, Ven: 44.8 mmHg (ref 44.0–60.0)
pH, Ven: 7.366 (ref 7.250–7.430)
pO2, Ven: <31 mmHg — CL (ref 32.0–45.0)

## 2020-04-08 LAB — COMPREHENSIVE METABOLIC PANEL
ALT: 97 U/L — ABNORMAL HIGH (ref 0–44)
ALT: 128 U/L — ABNORMAL HIGH (ref 0–44)
AST: 162 U/L — ABNORMAL HIGH (ref 15–41)
AST: 224 U/L — ABNORMAL HIGH (ref 15–41)
Albumin: 1.8 g/dL — ABNORMAL LOW (ref 3.5–5.0)
Albumin: 2 g/dL — ABNORMAL LOW (ref 3.5–5.0)
Alkaline Phosphatase: 119 U/L (ref 38–126)
Alkaline Phosphatase: 134 U/L — ABNORMAL HIGH (ref 38–126)
Anion gap: 11 (ref 5–15)
Anion gap: 10 (ref 5–15)
BUN: 5 mg/dL — ABNORMAL LOW (ref 6–20)
BUN: <5 mg/dL — ABNORMAL LOW (ref 6–20)
CO2: 21 mmol/L — ABNORMAL LOW (ref 22–32)
CO2: 23 mmol/L (ref 22–32)
Calcium: 7.7 mg/dL — ABNORMAL LOW (ref 8.9–10.3)
Calcium: 8 mg/dL — ABNORMAL LOW (ref 8.9–10.3)
Chloride: 105 mmol/L (ref 98–111)
Chloride: 107 mmol/L (ref 98–111)
Creatinine, Ser: 1.12 mg/dL — ABNORMAL HIGH (ref 0.44–1.00)
Creatinine, Ser: 1.14 mg/dL — ABNORMAL HIGH (ref 0.44–1.00)
GFR, Estimated: 57 mL/min — ABNORMAL LOW (ref >60)
GFR, Estimated: 56 mL/min — ABNORMAL LOW (ref >60)
Glucose, Bld: 63 mg/dL — ABNORMAL LOW (ref 70–99)
Glucose, Bld: 108 mg/dL — ABNORMAL HIGH (ref 70–99)
Potassium: 4.4 mmol/L (ref 3.5–5.1)
Potassium: 4.2 mmol/L (ref 3.5–5.1)
Sodium: 137 mmol/L (ref 135–145)
Sodium: 140 mmol/L (ref 135–145)
Total Bilirubin: 0.6 mg/dL (ref 0.3–1.2)
Total Bilirubin: 1.5 mg/dL — ABNORMAL HIGH (ref 0.3–1.2)
Total Protein: 5.5 g/dL — ABNORMAL LOW (ref 6.5–8.1)
Total Protein: 6.2 g/dL — ABNORMAL LOW (ref 6.5–8.1)

## 2020-04-08 LAB — CBC WITH DIFFERENTIAL/PLATELET
Abs Immature Granulocytes: 0.01 10*3/uL (ref 0.00–0.07)
Abs Immature Granulocytes: 0.02 10*3/uL (ref 0.00–0.07)
Basophils Absolute: 0 10*3/uL (ref 0.0–0.1)
Basophils Absolute: 0 10*3/uL (ref 0.0–0.1)
Basophils Relative: 0 %
Basophils Relative: 0 %
Eosinophils Absolute: 0 10*3/uL (ref 0.0–0.5)
Eosinophils Absolute: 0 10*3/uL (ref 0.0–0.5)
Eosinophils Relative: 0 %
Eosinophils Relative: 0 %
HCT: 32.9 % — ABNORMAL LOW (ref 36.0–46.0)
HCT: 27.2 % — ABNORMAL LOW (ref 36.0–46.0)
Hemoglobin: 10.8 g/dL — ABNORMAL LOW (ref 12.0–15.0)
Hemoglobin: 9.3 g/dL — ABNORMAL LOW (ref 12.0–15.0)
Immature Granulocytes: 0 %
Immature Granulocytes: 1 %
Lymphocytes Relative: 55 %
Lymphocytes Relative: 52 %
Lymphs Abs: 1.3 10*3/uL (ref 0.7–4.0)
Lymphs Abs: 1.2 10*3/uL (ref 0.7–4.0)
MCH: 33.1 pg (ref 26.0–34.0)
MCH: 34.3 pg — ABNORMAL HIGH (ref 26.0–34.0)
MCHC: 32.8 g/dL (ref 30.0–36.0)
MCHC: 34.2 g/dL (ref 30.0–36.0)
MCV: 100.9 fL — ABNORMAL HIGH (ref 80.0–100.0)
MCV: 100.4 fL — ABNORMAL HIGH (ref 80.0–100.0)
Monocytes Absolute: 0.3 10*3/uL (ref 0.1–1.0)
Monocytes Absolute: 0.4 10*3/uL (ref 0.1–1.0)
Monocytes Relative: 14 %
Monocytes Relative: 18 %
Neutro Abs: 0.7 10*3/uL — ABNORMAL LOW (ref 1.7–7.7)
Neutro Abs: 0.7 10*3/uL — ABNORMAL LOW (ref 1.7–7.7)
Neutrophils Relative %: 31 %
Neutrophils Relative %: 29 %
Platelets: 42 10*3/uL — ABNORMAL LOW (ref 150–400)
Platelets: 44 10*3/uL — ABNORMAL LOW (ref 150–400)
RBC: 3.26 MIL/uL — ABNORMAL LOW (ref 3.87–5.11)
RBC: 2.71 MIL/uL — ABNORMAL LOW (ref 3.87–5.11)
RDW: 23.6 % — ABNORMAL HIGH (ref 11.5–15.5)
RDW: 23.7 % — ABNORMAL HIGH (ref 11.5–15.5)
WBC: 2.4 10*3/uL — ABNORMAL LOW (ref 4.0–10.5)
WBC: 2.3 10*3/uL — ABNORMAL LOW (ref 4.0–10.5)
nRBC: 0 % (ref 0.0–0.2)
nRBC: 0 % (ref 0.0–0.2)

## 2020-04-08 LAB — MAGNESIUM
Magnesium: 2.1 mg/dL (ref 1.7–2.4)
Magnesium: 1.9 mg/dL (ref 1.7–2.4)

## 2020-04-08 LAB — URINALYSIS, ROUTINE W REFLEX MICROSCOPIC
Bilirubin Urine: NEGATIVE
Glucose, UA: NEGATIVE mg/dL
Hgb urine dipstick: NEGATIVE
Ketones, ur: NEGATIVE mg/dL
Nitrite: NEGATIVE
Protein, ur: NEGATIVE mg/dL
Specific Gravity, Urine: 1.008 (ref 1.005–1.030)
pH: 5 (ref 5.0–8.0)

## 2020-04-08 LAB — PROTIME-INR
INR: 1.1 (ref 0.8–1.2)
INR: 1.1 (ref 0.8–1.2)
Prothrombin Time: 13.8 seconds (ref 11.4–15.2)
Prothrombin Time: 13.3 seconds (ref 11.4–15.2)

## 2020-04-08 LAB — GLUCOSE, CAPILLARY
Glucose-Capillary: 100 mg/dL — ABNORMAL HIGH (ref 70–99)
Glucose-Capillary: 119 mg/dL — ABNORMAL HIGH (ref 70–99)
Glucose-Capillary: 136 mg/dL — ABNORMAL HIGH (ref 70–99)
Glucose-Capillary: 99 mg/dL (ref 70–99)

## 2020-04-08 LAB — RAPID URINE DRUG SCREEN, HOSP PERFORMED
Amphetamines: NOT DETECTED
Barbiturates: NOT DETECTED
Benzodiazepines: NOT DETECTED
Cocaine: POSITIVE — AB
Opiates: NOT DETECTED
Tetrahydrocannabinol: POSITIVE — AB

## 2020-04-08 LAB — FOLATE: Folate: 29.9 ng/mL (ref >5.9)

## 2020-04-08 LAB — HEMOGLOBIN AND HEMATOCRIT, BLOOD
HCT: 23.3 % — ABNORMAL LOW (ref 36.0–46.0)
Hemoglobin: 7.8 g/dL — ABNORMAL LOW (ref 12.0–15.0)

## 2020-04-08 LAB — AMMONIA: Ammonia: 26 umol/L (ref 9–35)

## 2020-04-08 LAB — MRSA PCR SCREENING: MRSA by PCR: POSITIVE — AB

## 2020-04-08 LAB — SARS CORONAVIRUS 2 (TAT 6-24 HRS): SARS Coronavirus 2: NEGATIVE

## 2020-04-08 LAB — VITAMIN B12: Vitamin B-12: 1959 pg/mL — ABNORMAL HIGH (ref 180–914)

## 2020-04-08 LAB — LACTATE DEHYDROGENASE: LDH: 349 U/L — ABNORMAL HIGH (ref 98–192)

## 2020-04-08 LAB — PHOSPHORUS
Phosphorus: 3.8 mg/dL (ref 2.5–4.6)
Phosphorus: 3.7 mg/dL (ref 2.5–4.6)

## 2020-04-08 LAB — CBG MONITORING, ED: Glucose-Capillary: 74 mg/dL (ref 70–99)

## 2020-04-08 IMAGING — MR MR HEAD W/O CM
12 of 13 series · 44 of 48 positions shown · non-contrast
Comparison: Prior head CT from [DATE].

CLINICAL DATA: Initial evaluation for delirium, dizziness.

EXAM:
MRI HEAD WITHOUT CONTRAST
TECHNIQUE: Multiplanar, multiecho pulse sequences of the brain and surrounding
structures were obtained without intravenous contrast.

[Series 5: DWI · axial · 3.0mm · 0.88mm/px · z∈[-87,+48]mm · 8 of 104 slices shown (1 of 4)]
[im 1/104]
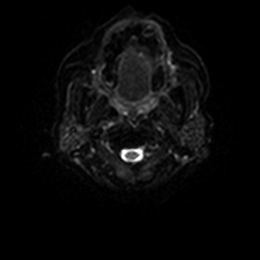
[im 15/104]
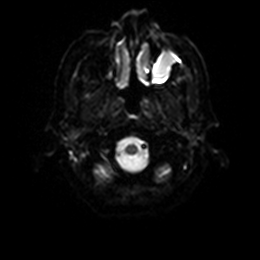
[im 30/104]
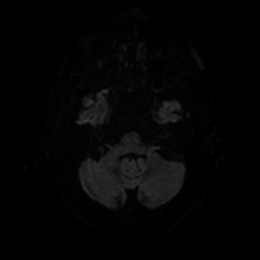
[im 45/104]
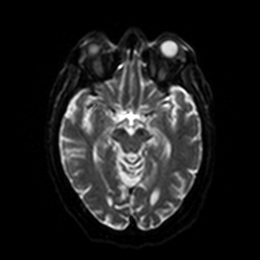
[im 59/104]
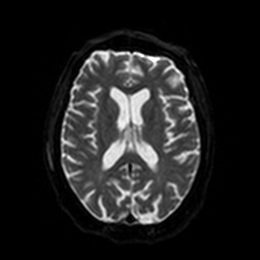
[im 74/104]
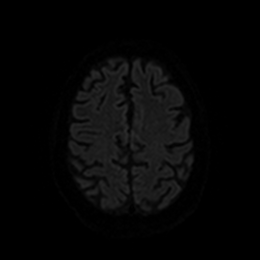
[im 89/104]
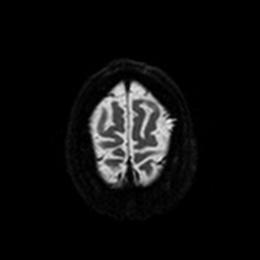
[im 104/104]
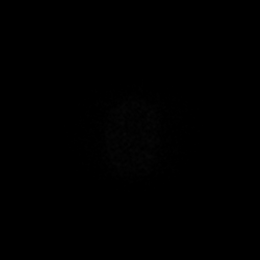

[Series 6: DWI · axial · 3.0mm · 0.88mm/px · z∈[-87,+48]mm · 4 of 52 slices shown (2 of 4)]
[im 1/52]
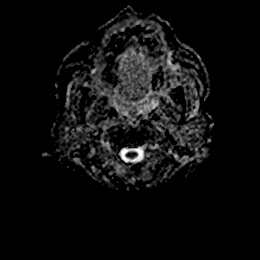
[im 18/52]
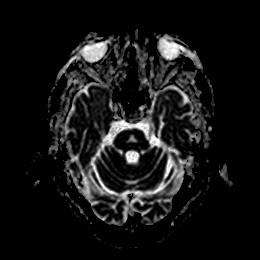
[im 35/52]
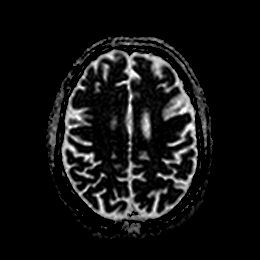
[im 52/52]
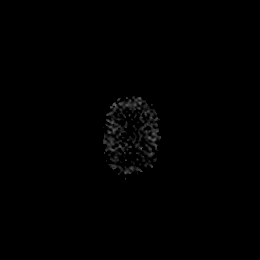

[Series 7: DWI · coronal · 4.0mm · 0.88mm/px · 5 of 72 slices shown (3 of 4)]
[im 1/72]
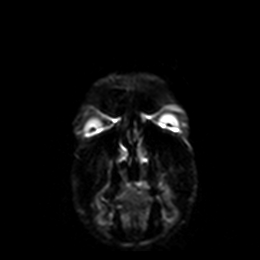
[im 18/72]
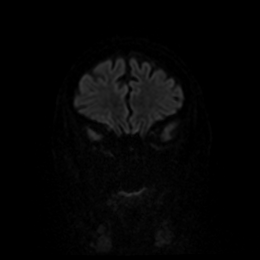
[im 36/72]
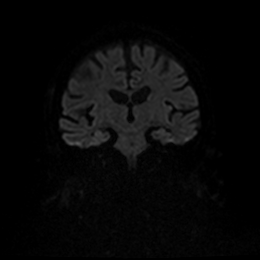
[im 54/72]
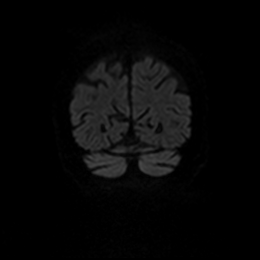
[im 72/72]
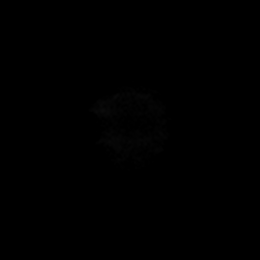

[Series 8: DWI · coronal · 4.0mm · 0.88mm/px · 3 of 36 slices shown (4 of 4)]
[im 1/36]
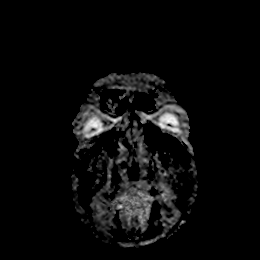
[im 18/36]
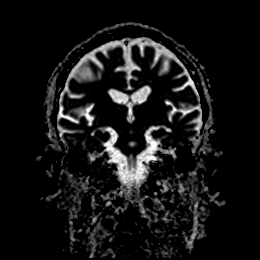
[im 36/36]
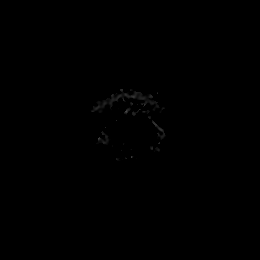

[Series 9: T1 · sagittal · 5.0mm · 0.75mm/px · 2 of 23 slices shown]
[im 1/23]
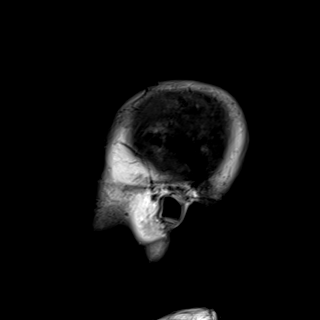
[im 23/23]
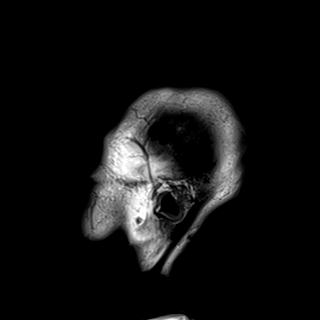

[Series 10: T2 · axial · 5.0mm · 0.72mm/px · z∈[-88,+40]mm · 2 of 25 slices shown (1 of 2)]
[im 1/25]
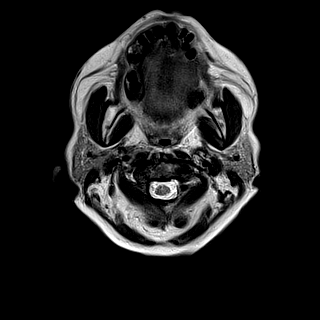
[im 25/25]
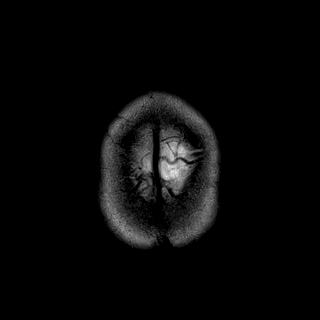

[Series 11: FLAIR · axial · 5.0mm · 0.45mm/px · z∈[-86,+42]mm · 2 of 25 slices shown]
[im 1/25]
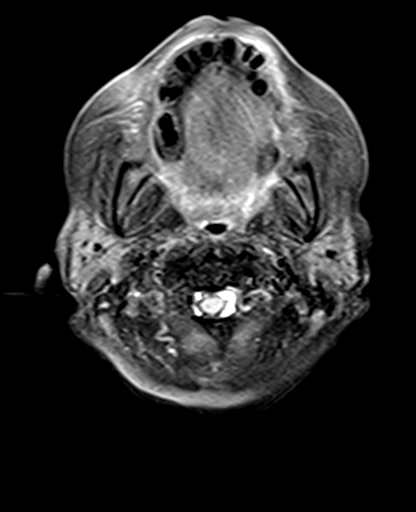
[im 25/25]
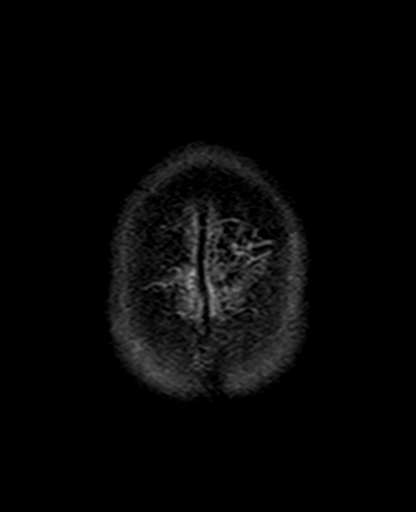

[Series 12: mag_images · axial · 3.0mm · 0.90mm/px · z∈[-92,+64]mm · 4 of 60 slices shown]
[im 1/60]
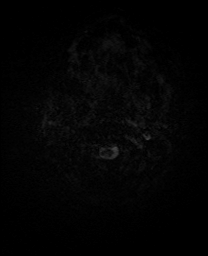
[im 20/60]
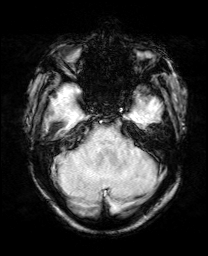
[im 40/60]
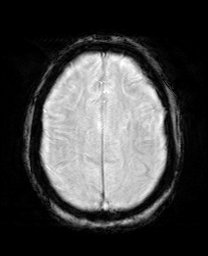
[im 60/60]
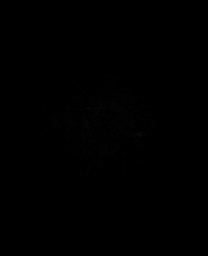

[Series 13: pha_images · axial · 3.0mm · 0.90mm/px · z∈[-89,+56]mm · 4 of 53 slices shown]
[im 1/53]
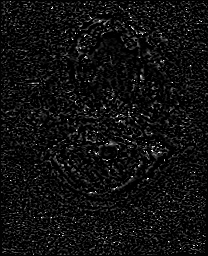
[im 18/53]
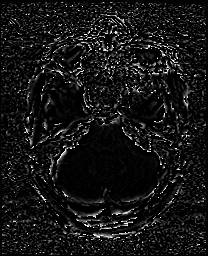
[im 35/53]
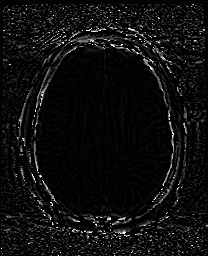
[im 53/53]
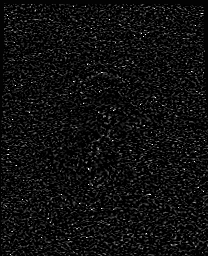

[Series 14: swi_images · axial · 3.0mm · 0.90mm/px · z∈[-92,+64]mm · 4 of 60 slices shown]
[im 1/60]
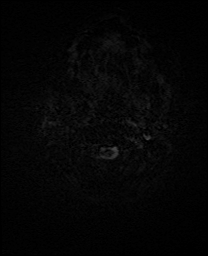
[im 20/60]
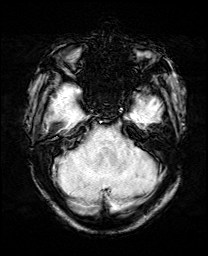
[im 40/60]
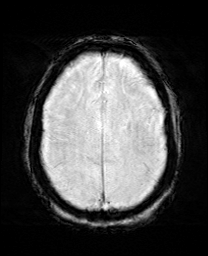
[im 60/60]
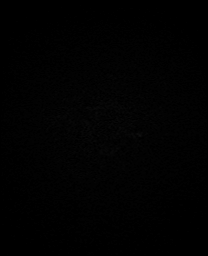

[Series 15: mip_images(sw) · axial · 24.0mm · 0.90mm/px · z∈[-83,+55]mm · 4 of 53 slices shown]
[im 1/53]
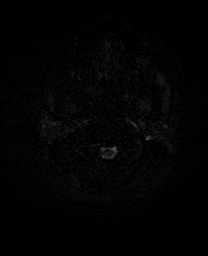
[im 18/53]
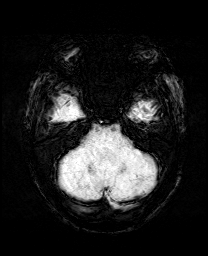
[im 35/53]
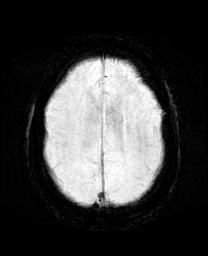
[im 53/53]
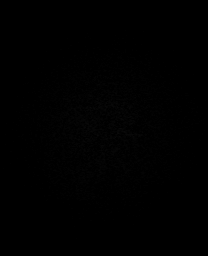

[Series 17: T2 · oblique · 5.0mm · 0.34mm/px · 2 of 29 slices shown (2 of 2)]
[im 1/29]
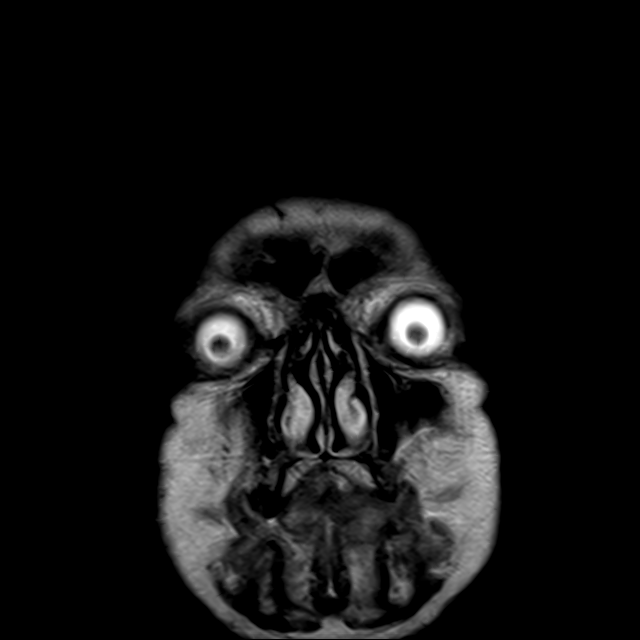
[im 29/29]
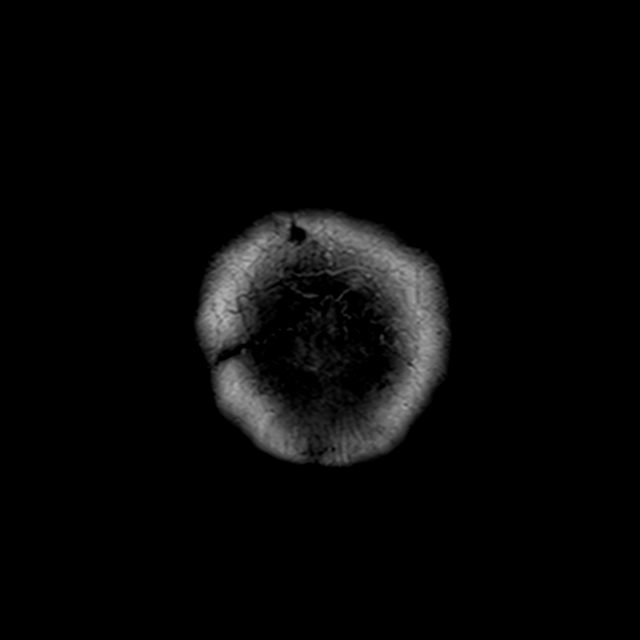

[44 of 48 positions shown; findings below may reference images not displayed]

FINDINGS: Brain: Moderately advanced age-related cerebral atrophy, stable from
previous. Remote lacunar infarct within the central aspect of the
pons, unchanged. Single punctate subcentimeter focus of FLAIR
hyperintensity at the left frontal centrum semi ovale, stable, and
of doubtful significance in isolation. No other significant cerebral
white matter disease or focal parenchymal signal abnormality.

No abnormal foci of restricted diffusion to suggest acute or
subacute ischemia. Gray-white matter differentiation maintained. No
other areas of encephalomalacia to suggest chronic cortical
infarction. No foci of susceptibility artifact to suggest acute or
chronic intracranial hemorrhage.

No mass lesion, midline shift or mass effect. No hydrocephalus or
extra-axial fluid collection. Pituitary gland suprasellar region
normal. Midline structures intact.

Vascular: Major intracranial vascular flow voids are maintained.
Dominant left vertebral artery noted.

Skull and upper cervical spine: Craniocervical junction within
normal limits. Heterogeneous marrow signal intensity seen throughout
the visualized bone marrow. No focal marrow replacing lesions. No
scalp soft tissue abnormality.

Sinuses/Orbits: Globes and orbital soft tissues demonstrate no acute
finding. Scattered mucosal thickening noted throughout the paranasal
sinuses. Superimposed left maxillary sinus retention cyst noted.
Bilateral mastoid effusions noted. Inner ear structures normal.
Visualized nasopharynx within normal limits.

Other: None.
IMPRESSION: 1. No acute intracranial abnormality.
2. Remote lacunar infarct within the central aspect of the pons,
unchanged.
3. Moderately advanced age-related cerebral atrophy.
4. Bilateral mastoid effusions, of uncertain significance.
Correlation with physical exam suggested.

## 2020-04-08 MED ORDER — SODIUM CHLORIDE 0.9 % IV SOLN
1.0000 mg | Freq: Every day | INTRAVENOUS | Status: DC
  Administered 2020-04-08: 1 mg via INTRAVENOUS
  Filled 2020-04-08 (×2): qty 0.2

## 2020-04-08 MED ORDER — CHLORHEXIDINE GLUCONATE CLOTH 2 % EX PADS
6.0000 | MEDICATED_PAD | Freq: Every day | CUTANEOUS | Status: DC
  Administered 2020-04-09 – 2020-04-11 (×3): 6 via TOPICAL

## 2020-04-08 MED ORDER — SODIUM CHLORIDE 0.9 % IV SOLN: INTRAVENOUS | Status: DC

## 2020-04-08 MED ORDER — THIAMINE HCL 100 MG/ML IJ SOLN
100.0000 mg | Freq: Every day | INTRAMUSCULAR | Status: DC
  Administered 2020-04-08 – 2020-04-11 (×4): 100 mg via INTRAVENOUS
  Filled 2020-04-08 (×4): qty 2

## 2020-04-08 MED ORDER — DEXTROSE IN LACTATED RINGERS 5 % IV SOLN: INTRAVENOUS | Status: DC

## 2020-04-08 MED ORDER — MUPIROCIN 2 % EX OINT
1.0000 "application " | TOPICAL_OINTMENT | Freq: Two times a day (BID) | CUTANEOUS | Status: DC
  Administered 2020-04-08 – 2020-04-11 (×6): 1 via NASAL
  Filled 2020-04-08: qty 22

## 2020-04-08 NOTE — H&P (View-Only) (Signed)
Consultation  Referring Provider: Dr. Tyrell Antonio     Primary Care Physician:  Pcp, No Primary Gastroenterologist: Althia Forts       Reason for Consultation: Anemia, melena, history of alcohol abuse         HPI:   Brenda [name] is a 60 y.o. 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    From previous physicians notes from family members.  Apparently patient has exhibited some confusion relative to her baseline.  The family acknowledged the patient "smoked crack" earlier in the day on 04/07/2020.  Family deny blood thinners or use of NSAIDs.  Did report that she has a history of chronic alcohol abuse, stating that she typically consumes 12-16 beers per day, although they are unsure when her most recent consumption of alcohol was.  Patient reported that she had just been "not feeling well" over the last few days and describes generalized abdominal discomfort that was new for her.  Also described a decrease in appetite over the past 3 to 4 days resulting in diminished oral intake.    When I spoke with the patient this morning, she is a very poor historian, she tells me that she has had some abdominal pain "more at the top" for a year and has been taking Goody powders, sometimes "more than 1 a day" for a long time.  Describes that typically she will just lay down and when she wakes back up the next day the pain is gone.  Admits to using cocaine on more than one occasion as well as drinking alcohol.  Also describes seeing some black stools "here and there" over the past year or so.  Describes that yesterday she felt "really foggy headed/confused" and this morning she is feeling slightly better.    Denies fever or chills.  ED course: CMP with a sodium of 135, potassium 3.2, BUN 7  relative to most recent prior BUN of 5 on 03/27/2020, creatinine 1.08, alk phos 138 (most recent prior value 239), AST 123, ALT 84 and total bili 0.6, WBC 2000, hemoglobin 4.9 (8.7 on 03/27/2020); started on octreotide drip and Rocephin for SBP prophylaxis, n.p.o. after midnight, also on Protonix 40 drip, patient typed and screened with 3 units PRBCs  No prior GI history  Past Medical History:  Diagnosis Date  . Breast cancer ([facility])   . Depression   . Mental disorder     Past Surgical History:  Procedure Laterality Date  . IR IMAGING GUIDED PORT INSERTION  11/03/2019  . TUBAL LIGATION    . VIDEO BRONCHOSCOPY WITH ENDOBRONCHIAL NAVIGATION N/A 04/04/2019   Procedure: VIDEO BRONCHOSCOPY WITH ENDOBRONCHIAL NAVIGATION;  Surgeon: Garner Nash, DO;  Location: [place];  Service: Thoracic;  Laterality: N/A;  . VIDEO BRONCHOSCOPY WITH ENDOBRONCHIAL ULTRASOUND N/A 04/04/2019   Procedure: VIDEO BRONCHOSCOPY WITH ENDOBRONCHIAL ULTRASOUND;  Surgeon: Garner Nash, DO;  Location: [place];  Service: Thoracic;  Laterality: N/A;    History reviewed. No pertinent family history.   Social History   Tobacco Use  . Smoking status: Former Smoker    Packs/day: 0.50    Years: 30.00    Pack years: 15.00    Types: Cigarettes    Quit date: 05/27/2019    Years since quitting: 0.8  . Smokeless  tobacco: Never Used  Vaping Use  . Vaping Use: Never used  Substance Use Topics  . Alcohol use: Yes    Alcohol/week: 84.0 standard drinks    Types: 84 Cans of beer per week  . Drug use: Yes    Types: Cocaine    Prior to Admission medications   Medication Sig Start Date End Date Taking? Authorizing Provider  calcium carbonate (OSCAL) 1500 (600 Ca) MG TABS tablet Take 600 mg by mouth daily.    [provider]  folic acid (FOLVITE) 1 MG tablet Take 1 tablet (1 mg total) by mouth daily. 09/22/19   [name], [name], MD  lidocaine-prilocaine (EMLA) cream Apply 1 application topically as needed. 10/19/19    Heilingoetter, Cassandra L, PA-C  potassium chloride SA (KLOR-CON) 20 MEQ tablet Take 1 tablet (20 mEq total) by mouth daily. 03/06/20   [name], [name], MD  prochlorperazine (COMPAZINE) 10 MG tablet Take 1 tablet (10 mg total) by mouth every 6 (six) hours as needed for nausea or vomiting. 01/17/20   Heilingoetter, Cassandra L, PA-C    Current Facility-Administered Medications  Medication Dose Route Frequency Provider Last Rate Last Admin  . 0.9 %  sodium chloride infusion  10 mL/hr Intravenous Once Howerter, Justin B, DO   Held at 04/07/20 2235  . cefTRIAXone (ROCEPHIN) 1 g in sodium chloride 0.9 % 100 mL IVPB  1 g Intravenous Q24H Howerter, Justin B, DO      . dextrose 5 % in lactated ringers infusion   Intravenous Continuous Regalado, Belkys A, MD 50 mL/hr at 04/08/20 0820 New Bag at 04/08/20 0820  . LORazepam (ATIVAN) tablet 1-4 mg  1-4 mg Oral Q1H PRN Howerter, Justin B, DO       Or  . LORazepam (ATIVAN) injection 1-4 mg  1-4 mg Intravenous Q1H PRN Howerter, Justin B, DO      . octreotide (SANDOSTATIN) 500 mcg in sodium chloride 0.9 % 250 mL (2 mcg/mL) infusion  50 mcg/hr Intravenous Continuous Howerter, Justin B, DO 25 mL/hr at 04/08/20 0636 50 mcg/hr at 04/08/20 0636  . pantoprazole (PROTONIX) 80 mg in sodium chloride 0.9 % 100 mL (0.8 mg/mL) infusion  8 mg/hr Intravenous Continuous Howerter, Justin B, DO 10 mL/hr at 04/08/20 0635 8 mg/hr at 04/08/20 0635   Current Outpatient Medications  Medication Sig Dispense Refill  . calcium carbonate (OSCAL) 1500 (600 Ca) MG TABS tablet Take 600 mg by mouth daily.    . folic acid (FOLVITE) 1 MG tablet Take 1 tablet (1 mg total) by mouth daily. 30 tablet 4  . lidocaine-prilocaine (EMLA) cream Apply 1 application topically as needed. 30 g 2  . potassium chloride SA (KLOR-CON) 20 MEQ tablet Take 1 tablet (20 mEq total) by mouth daily. 10 tablet 0  . prochlorperazine (COMPAZINE) 10 MG tablet Take 1 tablet (10 mg total) by mouth every 6 (six) hours as  needed for nausea or vomiting. 30 tablet 3    Allergies as of 04/07/2020 - Review Complete 04/07/2020  Allergen Reaction Noted  . Aspirin adult low [aspirin]  02/25/2012     Review of Systems:    Constitutional: No fever or chills Skin: No rash  Cardiovascular: No chest pain Respiratory: No SOB Gastrointestinal: See HPI and otherwise negative Genitourinary: No dysuria Neurological: No syncope Musculoskeletal: No new muscle or joint pain Hematologic: No bruising Psychiatric: No history of depression or anxiety    Physical Exam:  Vital signs in last 24 hours: Temp:  [97.8 F (36.6 C)-98.4 F (  36.9 C)] 98.2 F (36.8 C) (01/17 0636) Pulse Rate:  [72-103] 78 (01/17 0823) Resp:  [8-27] 16 (01/17 0800) BP: (80-127)/(45-99) 118/79 (01/17 0823) SpO2:  [100 %] 100 % (01/17 0800)   General:   AA female appears to be in NAD, Well developed, Well nourished, alert and cooperative Head:  Normocephalic and atraumatic. Eyes:   PEERL, EOMI. No icterus. Conjunctiva pink. Ears:  Normal auditory acuity. Neck:  Supple Throat: Oral cavity and pharynx without inflammation, swelling or lesion.  Lungs: Respirations even and unlabored. Lungs clear to auscultation bilaterally.   No wheezes, crackles, or rhonchi.  Heart: Normal S1, S2. No MRG. Regular rate and rhythm. No peripheral edema, cyanosis or pallor.  Abdomen:  Soft, nondistended, nontender. No rebound or guarding. Normal bowel sounds. No appreciable masses or hepatomegaly. Rectal:  Not performed.  Msk:  Symmetrical without gross deformities. Peripheral pulses intact.  Extremities:  Without edema, no deformity or joint abnormality.  Neurologic:  Alert and  oriented x3;  grossly normal neurologically.  Skin:   Dry and intact without significant lesions or rashes. Psychiatric: Demonstrates good judgement and reason without abnormal affect or behaviors. +poor historian   LAB RESULTS: Recent Labs    04/07/20 1738  WBC 2.0*  HGB 4.9*   HCT 14.4*  PLT 37*   BMET Recent Labs    04/07/20 1738 04/08/20 0548  NA 135 137  K 3.2* 4.4  CL 101 105  CO2 22 21*  GLUCOSE 92 63*  BUN 7 5*  CREATININE 1.08* 1.12*  CALCIUM 8.0* 7.7*   LFT Recent Labs    04/08/20 0548  PROT 5.5*  ALBUMIN 1.8*  AST 162*  ALT 97*  ALKPHOS 119  BILITOT 0.6   PT/INR Recent Labs    04/08/20 0548  LABPROT 13.8  INR 1.1   Ammonia 26  STUDIES: CT Head Wo Contrast  Result Date: 04/07/2020 CLINICAL DATA:  Confusion x2 days.  History of stage IV lung cancer. EXAM: CT HEAD WITHOUT CONTRAST TECHNIQUE: Contiguous axial images were obtained from the base of the skull through the vertex without intravenous contrast. COMPARISON:  September 11, 2019 FINDINGS: Brain: There is mild cerebral atrophy with widening of the extra-axial spaces and ventricular dilatation. There are areas of decreased attenuation within the white matter tracts of the supratentorial brain, consistent with microvascular disease changes. Vascular: No hyperdense vessel or unexpected calcification. Skull: Normal. Negative for fracture or focal lesion. Sinuses/Orbits: There is mild left ethmoid sinus mucosal thickening. Other: None. IMPRESSION: 1. Generalized cerebral atrophy. 2. No acute intracranial abnormality. 3. Mild left ethmoid sinus disease. Electronically Signed   By: Thaddeus  Houston M.D.   On: 04/07/2020 17:19    Impression / Plan:   Impression: 1.  Upper GI bleed: Patient reports black stools off and on for a year (she is a poor historian), now with acute on chronic anemia and a rectal exam in the ED revealing melena, hemoglobin 4.9 (8.5-10 at baseline), does report Goody powder usage and alcohol abuse; consider bleeding varices versus PUD versus other 2.  Acute on chronic anemia: Likely from above 3.  Acute encephalopathy: At time of admission, but appears to be clearing 4.  Acute on chronic pancytopenia: Currently on chemotherapy for lung cancer 5.  Hypokalemia:  Improved this morning 6.  Polysubstance abuse: Positive for cocaine and history of alcohol abuse with 10-12 beers per day per family members  Plan: 1.  Patient will need an EGD for further evaluation of melena and acute anemia,   but will leave timing of this up to Dr. Danis.  Her last platelets were only 37, CBC is pending from this morning.  She is also now status post 3 units of PRBCs with initial hemoglobin of 4.9, but again recheck is pending. 2.  Agree with Protonix drip 3.  Agree with empiric Rocephin for SBP prophylaxis 4.  Patient can remain n.p.o. for now, though if we do not plan on doing procedures today she can likely have clears 5.  Agree with CIWA protocol for alcohol withdrawal 6.  Continue to monitor hemoglobin with transfusion as needed less than 7 7.  Please await any further recommendations from Dr. Danis later today.  Thank you for your kind consultation, we will continue to follow.  Jennifer Lynne Lemmon  04/08/2020, 8:53 AM   Addendum: 1000 am 04/08/20 Attempted to call niece Amanda Eggleston as listed in contacts.  No answer x2. LMOM to call back in regards to consent for EGD.  JLL _______________________________  I have reviewed the entire case in detail with the above APP and discussed the plan in detail.  Therefore, I agree with the diagnoses recorded above. In addition,  I have personally interviewed and examined the patient and have personally reviewed any abdominal/pelvic CT scan images.  My additional thoughts are as follows:  Acute on chronic macrocytic anemia.  Reported melena, unknown how long this has been occurring.  Hemoglobin is significantly below her recent baseline, indicating acute GI bleeding in recent days.  Marked thrombocytopenia, some likely chronic but also acute from consumption due to bleeding. Alcohol and cocaine use, chronic pancytopenia raises suspicion for alcohol-related marrow suppression and/or possible cirrhosis.  No obvious exam  stigmata of cirrhosis.  Recent chemotherapy for advanced lung cancer also likely contributing (though patient has been on this cyclic drug regimen 6 months - recent oncology office note reviewed.) Fortunately, she is hemodynamically stable and has received 2 units of PRBCs overnight.  She is on a Protonix drip as well as octreotide and antibiotics.  There was clinical concern for cirrhosis as noted above, so the octreotide was started in case this is variceal bleeding.  We will continue that therapy for now, though I am more suspicious for peptic ulcer given her chronic NSAID use and polysubstance abuse then I am of variceal bleeding given her overall clinical scenario and hemodynamic stability at present.  Repeat stat CBC is pending, after which we will make a decision on timing of upper endoscopy and possible need for platelet transfusion.  Patient was reportedly ordered for 3 units of PRBCs, but blood bank released 2 units due to current PRBC shortage and required repeat CBC before any further PRBCs released.   At the time of my evaluation, this patient does not know the month and year, and believes she is at [facility].  She initially told me she had no family in the area, then when I brought up the fact that she was found sick by her niece, she then recalled she has a niece and perhaps her brother in the area.  She also tells me she lives [place] which may be a local motel/hotel. I think her mental status precludes adequate informed consent for an endoscopic procedure, so we have a call out to her family for discussion consent.  If she becomes unstable and requires an emergent procedure, we would need emergent provider consent.  Because of altered mental status unclear, duration also unknown.  History of polysubstance abuse.    Normal ammonia level speaks against hepatic encephalopathy.  She was significantly hypoglycemic this morning, but after D5LR fluids her hemoglobin was  reportedly improved to 78 (after speaking with her bedside nurse), and mental status was also altered with a glucose in the 90s on admission last evening.  Patient at increased risk for cardiopulmonary complications of procedure due to medical comorbidities.   Further plan to follow    L Danis III Office:[phone]   

## 2020-04-08 NOTE — Progress Notes (Addendum)
PT Cancellation Note  Patient Details Name: Brenda [name] MRN: [id number] DOB: 02/11/1961   Cancelled Treatment:    Reason Eval/Treat Not Completed: Active bedrest order Pt on bedrest with bathroom privileges. Also with low platelets. Will await increase in activity orders prior to PT evaluation. Will follow.   Marguarite Arbour A Martie Fulgham 04/08/2020, 1:28 PM Marisa Severin, PT, DPT Acute Rehabilitation Services Pager [phone] Office [phone]

## 2020-04-08 NOTE — Plan of Care (Signed)
  Problem: Health Behavior/Discharge Planning: Goal: Ability to manage health-related needs will improve Outcome: Progressing   Problem: Clinical Measurements: Goal: Ability to maintain clinical measurements within normal limits will improve Outcome: Progressing   

## 2020-04-08 NOTE — Progress Notes (Signed)
Update:  CBC has been collected and is pending. Patient may need platelets pending results.  Spoke with niece and have received phone consent with the help of RN for EGD tomorrow.  Patient allowed ice chips and water today and then NPO at midnight.  Ellouise Newer, PA-C 04/08/20 1:34 pm

## 2020-04-08 NOTE — Progress Notes (Signed)
PROGRESS NOTE    Brenda [name]  RKY:[id number] DOB: 22-May-1960 DOA: 04/07/2020 PCP: Pcp, No   Brief Narrative: 60 year old with past medical history significant for stage IV adenocarcinoma of the right lung on chemotherapy, chronic anemia with baseline hemoglobin 8-10, HIV, chronic alcohol abuse, admitted to [facility] on 04/07/2020 with concern of acute upper GI bleed after presenting with melena (rectal exam performed in the ED reveal melena) , and evaluation for confusion.  Patient at baseline is alert and oriented x4 without any significant confusion, family noticed that patient has been confused.  Family also acknowledge that patient "smoked crack" earlier in the day on 04/07/2020.  Patient admitted with severe pancytopenia with a hemoglobin of 4.9, platelet of 37, concern for GI bleed, GI was consulted the patient was started on Protonix drip, octreotide tried, IV antibiotics.  Plan for endoscopy 1/18.   Assessment & Plan:   Principal Problem:   Acute upper GI bleed Active Problems:   Alcohol abuse   Hypokalemia   Acute on chronic anemia   Acute encephalopathy   Pancytopenia ([facility])   Polysubstance abuse ([facility])  1-Acute upper GI bleed: Presents with melena revealed on rectal exam, hemoglobin of 4.9. Continue with IV Protonix drip.  Acute tried and IV ceftriaxone, in case that patient has varices.  Patient received 2 unit of packed red blood cell.  hemoglobin  increased to 7.8. Continue to cycle hemoglobin. Plan for endoscopy tomorrow.  2-Pancytopenia, severe anemia: Acute blood loss anemia: -Dr. Earlie Server will follow-up in consultation.  He thinks patient presentation could be related to GI bleed. -Continue with blood transfusion as needed.  3-Acute metabolic encephalopathy; CT head negative for acute intracranial abnormality. Presentation could be related to alcohol use, drug abuse, anemia. B12;  Ordered.  Will check MRI of the brain. No fevers or renal failure. Ammonia  level normal.   4-Hypokalemia;  Replaced.   5-Chronic alcohol abuse; polysubstance abuse.  -Continue with CIWA.  Thiamine and folic acid.  -she will need counseling, when confusion improved.   6-Hypoglycemia; Started D 5.     Estimated body mass index is 17.9 kg/m as calculated from the following:   Height as of 03/27/20: 5\' 6"  (1.676 m).   Weight as of 03/27/20: 50.3 kg.   DVT prophylaxis: SCD Code Status: Full code Family Communication: Care discussed with niece.  Disposition Plan:  Status is: Inpatient  Remains inpatient appropriate because:Hemodynamically unstable   Dispo: The patient is from: Home              Anticipated d/c is to: to be determine              Anticipated d/c date is: 3 days              Patient currently is not medically stable to d/c.        Consultants:   GI  [name].   Procedures:     Antimicrobials:  Ceftriaxone 2 gr   Subjective: She denies abdominal pain. She doesn't know when was last BM.  She denies abdominal pain.  I explain to her she need to abstinence from drugs.  She is confuse, unable to provide history.   Objective: Vitals:   04/08/20 1000 04/08/20 1030 04/08/20 1100 04/08/20 1144  BP: 124/73 130/88 133/83   Pulse: 78 75 76   Resp: 17 15 12    Temp:    97.9 F (36.6 C)  TempSrc:    Oral  SpO2: 100% 100% 100%  Intake/Output Summary (Last 24 hours) at 04/08/2020 1201 Last data filed at 04/08/2020 0918 Gross per 24 hour  Intake 1000 ml  Output -  Net 1000 ml   There were no vitals filed for this visit.  Examination:  General exam: Appears calm and comfortable  Respiratory system: Clear to auscultation. Respiratory effort normal. Cardiovascular system: S1 & S2 heard, RRR.  Gastrointestinal system: Abdomen is nondistended, soft and nontender. No organomegaly or masses felt. Normal bowel sounds heard. Central nervous system: Alert and oriented to person only.  Extremities: no edema.    Data  Reviewed: I have personally reviewed following labs and imaging studies  CBC: Recent Labs  Lab 04/07/20 1738 04/08/20 1025  WBC 2.0*  --   NEUTROABS 0.6*  --   HGB 4.9* 7.8*  HCT 14.4* 23.3*  MCV 114.3*  --   PLT 37*  --    Basic Metabolic Panel: Recent Labs  Lab 04/07/20 1738 04/08/20 0548  NA 135 137  K 3.2* 4.4  CL 101 105  CO2 22 21*  GLUCOSE 92 63*  BUN 7 5*  CREATININE 1.08* 1.12*  CALCIUM 8.0* 7.7*  MG  --  2.1  PHOS  --  3.8   GFR: Estimated Creatinine Clearance: 44 mL/min (A) (by C-G formula based on SCr of 1.12 mg/dL (H)). Liver Function Tests: Recent Labs  Lab 04/07/20 1738 04/08/20 0548  AST 123* 162*  ALT 84* 97*  ALKPHOS 138* 119  BILITOT 0.6 0.6  PROT 6.0* 5.5*  ALBUMIN 2.0* 1.8*   No results for input(s): LIPASE, AMYLASE in the last 168 hours. Recent Labs  Lab 04/08/20 0520  AMMONIA 26   Coagulation Profile: Recent Labs  Lab 04/08/20 0548  INR 1.1   Cardiac Enzymes: No results for input(s): CKTOTAL, CKMB, CKMBINDEX, TROPONINI in the last 168 hours. BNP (last 3 results) No results for input(s): PROBNP in the last 8760 hours. HbA1C: No results for input(s): HGBA1C in the last 72 hours. CBG: Recent Labs  Lab 04/08/20 0755  GLUCAP 74   Lipid Profile: No results for input(s): CHOL, HDL, LDLCALC, TRIG, CHOLHDL, LDLDIRECT in the last 72 hours. Thyroid Function Tests: No results for input(s): TSH, T4TOTAL, FREET4, T3FREE, THYROIDAB in the last 72 hours. Anemia Panel: No results for input(s): VITAMINB12, FOLATE, FERRITIN, TIBC, IRON, RETICCTPCT in the last 72 hours. Sepsis Labs: No results for input(s): PROCALCITON, LATICACIDVEN in the last 168 hours.  Recent Results (from the past 240 hour(s))  SARS CORONAVIRUS 2 (TAT 6-24 HRS) Nasopharyngeal Nasopharyngeal Swab     Status: None   Collection Time: 04/07/20  9:14 PM   Specimen: Nasopharyngeal Swab  Result Value Ref Range Status   SARS Coronavirus 2 NEGATIVE NEGATIVE Final     Comment: (NOTE) SARS-CoV-2 target nucleic acids are NOT DETECTED.  The SARS-CoV-2 RNA is generally detectable in upper and lower respiratory specimens during the acute phase of infection. Negative results do not preclude SARS-CoV-2 infection, do not rule out co-infections with other pathogens, and should not be used as the sole basis for treatment or other patient management decisions. Negative results must be combined with clinical observations, patient history, and epidemiological information. The expected result is Negative.  Fact Sheet for Patients: SugarRoll.be  Fact Sheet for Healthcare Providers: https://www.woods-mathews.com/  This test is not yet approved or cleared by the [place] FDA and  has been authorized for detection and/or diagnosis of SARS-CoV-2 by FDA under an Emergency Use Authorization (EUA). This EUA will remain  in  effect (meaning this test can be used) for the duration of the COVID-19 declaration under Se ction 564(b)(1) of the Act, 21 U.S.C. section 360bbb-3(b)(1), unless the authorization is terminated or revoked sooner.  Performed at [facility], [place] [address] [place], [place] [address]          Radiology Studies: CT Head Wo Contrast  Result Date: 04/07/2020 CLINICAL DATA:  Confusion x2 days.  History of stage IV lung cancer. EXAM: CT HEAD WITHOUT CONTRAST TECHNIQUE: Contiguous axial images were obtained from the base of the skull through the vertex without intravenous contrast. COMPARISON:  September 11, 2019 FINDINGS: Brain: There is mild cerebral atrophy with widening of the extra-axial spaces and ventricular dilatation. There are areas of decreased attenuation within the white matter tracts of the supratentorial brain, consistent with microvascular disease changes. Vascular: No hyperdense vessel or unexpected calcification. Skull: Normal. Negative for fracture or focal lesion. Sinuses/Orbits: There  is mild left ethmoid sinus mucosal thickening. Other: None. IMPRESSION: 1. Generalized cerebral atrophy. 2. No acute intracranial abnormality. 3. Mild left ethmoid sinus disease. Electronically Signed   By: Virgina Norfolk M.D.   On: 04/07/2020 17:19        Scheduled Meds: Continuous Infusions: . sodium chloride Stopped (04/07/20 2235)  . cefTRIAXone (ROCEPHIN)  IV    . dextrose 5% lactated ringers 50 mL/hr at 04/08/20 0820  . octreotide  (SANDOSTATIN)    IV infusion 50 mcg/hr (04/08/20 0636)  . pantoprozole (PROTONIX) infusion 8 mg/hr (04/08/20 0635)     LOS: 1 day    Time spent: 35 minutes.     Elmarie Shiley, MD [facility]   If 7PM-7AM, please contact night-coverage [email or web address]  04/08/2020, 12:01 PM

## 2020-04-08 NOTE — Plan of Care (Signed)
Initiate Care Plan Problem: Education: Goal: Knowledge of General Education information will improve Description: Including pain rating scale, medication(s)/side effects and non-pharmacologic comfort measures Outcome: Progressing   Problem: Health Behavior/Discharge Planning: Goal: Ability to manage health-related needs will improve Outcome: Progressing   Problem: Clinical Measurements: Goal: Ability to maintain clinical measurements within normal limits will improve Outcome: Progressing Goal: Will remain free from infection Outcome: Progressing Goal: Diagnostic test results will improve Outcome: Progressing Goal: Respiratory complications will improve Outcome: Progressing Goal: Cardiovascular complication will be avoided Outcome: Progressing   Problem: Activity: Goal: Risk for activity intolerance will decrease Outcome: Progressing   Problem: Nutrition: Goal: Adequate nutrition will be maintained Outcome: Progressing   Problem: Coping: Goal: Level of anxiety will decrease Outcome: Progressing   Problem: Elimination: Goal: Will not experience complications related to bowel motility Outcome: Progressing Goal: Will not experience complications related to urinary retention Outcome: Progressing   Problem: Pain Managment: Goal: General experience of comfort will improve Outcome: Progressing   Problem: Safety: Goal: Ability to remain free from injury will improve Outcome: Progressing   Problem: Skin Integrity: Goal: Risk for impaired skin integrity will decrease Outcome: Progressing   Problem: Education: Goal: Knowledge of disease or condition will improve Outcome: Progressing Goal: Understanding of discharge needs will improve Outcome: Progressing   Problem: Health Behavior/Discharge Planning: Goal: Ability to identify changes in lifestyle to reduce recurrence of condition will improve Outcome: Progressing Goal: Identification of resources available to assist  in meeting health care needs will improve Outcome: Progressing   Problem: Physical Regulation: Goal: Complications related to the disease process, condition or treatment will be avoided or minimized Outcome: Progressing   Problem: Safety: Goal: Ability to remain free from injury will improve Outcome: Progressing   Problem: Education: Goal: Ability to identify signs and symptoms of gastrointestinal bleeding will improve Outcome: Progressing   Problem: Bowel/Gastric: Goal: Will show no signs and symptoms of gastrointestinal bleeding Outcome: Progressing   Problem: Fluid Volume: Goal: Will show no signs and symptoms of excessive bleeding Outcome: Progressing   Problem: Clinical Measurements: Goal: Complications related to the disease process, condition or treatment will be avoided or minimized Outcome: Progressing

## 2020-04-08 NOTE — Consult Note (Addendum)
   Consultation  Referring Provider: Dr. Regalado     Primary Care Physician:  Pcp, No Primary Gastroenterologist: Unassigned       Reason for Consultation: Anemia, melena, history of alcohol abuse         HPI:   Brenda [name] is a 59 y.o. 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    From previous physicians notes from family members.  Apparently patient has exhibited some confusion relative to her baseline.  The family acknowledged the patient "smoked crack" earlier in the day on 04/07/2020.  Family deny blood thinners or use of NSAIDs.  Did report that she has a history of chronic alcohol abuse, stating that she typically consumes 12-16 beers per day, although they are unsure when her most recent consumption of alcohol was.  Patient reported that she had just been "not feeling well" over the last few days and describes generalized abdominal discomfort that was new for her.  Also described a decrease in appetite over the past 3 to 4 days resulting in diminished oral intake.    When I spoke with the patient this morning, she is a very poor historian, she tells me that she has had some abdominal pain "more at the top" for a year and has been taking Goody powders, sometimes "more than 1 a day" for a long time.  Describes that typically she will just lay down and when she wakes back up the next day the pain is gone.  Admits to using cocaine on more than one occasion as well as drinking alcohol.  Also describes seeing some black stools "here and there" over the past year or so.  Describes that yesterday she felt "really foggy headed/confused" and this morning she is feeling slightly better.    Denies fever or chills.  ED course: CMP with a sodium of 135, potassium 3.2, BUN 7  relative to most recent prior BUN of 5 on 03/27/2020, creatinine 1.08, alk phos 138 (most recent prior value 239), AST 123, ALT 84 and total bili 0.6, WBC 2000, hemoglobin 4.9 (8.7 on 03/27/2020); started on octreotide drip and Rocephin for SBP prophylaxis, n.p.o. after midnight, also on Protonix 40 drip, patient typed and screened with 3 units PRBCs  No prior GI history  Past Medical History:  Diagnosis Date  . Breast cancer (HCC)   . Depression   . Mental disorder     Past Surgical History:  Procedure Laterality Date  . IR IMAGING GUIDED PORT INSERTION  11/03/2019  . TUBAL LIGATION    . VIDEO BRONCHOSCOPY WITH ENDOBRONCHIAL NAVIGATION N/A 04/04/2019   Procedure: VIDEO BRONCHOSCOPY WITH ENDOBRONCHIAL NAVIGATION;  Surgeon: Icard, Bradley L, DO;  Location: [facility] OR;  Service: Thoracic;  Laterality: N/A;  . VIDEO BRONCHOSCOPY WITH ENDOBRONCHIAL ULTRASOUND N/A 04/04/2019   Procedure: VIDEO BRONCHOSCOPY WITH ENDOBRONCHIAL ULTRASOUND;  Surgeon: Icard, Bradley L, DO;  Location: [facility] OR;  Service: Thoracic;  Laterality: N/A;    History reviewed. No pertinent family history.   Social History   Tobacco Use  . Smoking status: Former Smoker    Packs/day: 0.50    Years: 30.00    Pack years: 15.00    Types: Cigarettes    Quit date: 05/27/2019    Years since quitting: 0.8  . Smokeless   tobacco: Never Used  Vaping Use  . Vaping Use: Never used  Substance Use Topics  . Alcohol use: Yes    Alcohol/week: 84.0 standard drinks    Types: 84 Cans of beer per week  . Drug use: Yes    Types: Cocaine    Prior to Admission medications   Medication Sig Start Date End Date Taking? Authorizing Provider  calcium carbonate (OSCAL) 1500 (600 Ca) MG TABS tablet Take 600 mg by mouth daily.    [provider]  folic acid (FOLVITE) 1 MG tablet Take 1 tablet (1 mg total) by mouth daily. 09/22/19   [name], [name], MD  lidocaine-prilocaine (EMLA) cream Apply 1 application topically as needed. 10/19/19    Heilingoetter, Cassandra L, PA-C  potassium chloride SA (KLOR-CON) 20 MEQ tablet Take 1 tablet (20 mEq total) by mouth daily. 03/06/20   [name], [name], MD  prochlorperazine (COMPAZINE) 10 MG tablet Take 1 tablet (10 mg total) by mouth every 6 (six) hours as needed for nausea or vomiting. 01/17/20   Heilingoetter, Cassandra L, PA-C    Current Facility-Administered Medications  Medication Dose Route Frequency Provider Last Rate Last Admin  . 0.9 %  sodium chloride infusion  10 mL/hr Intravenous Once Howerter, Justin B, DO   Held at 04/07/20 2235  . cefTRIAXone (ROCEPHIN) 1 g in sodium chloride 0.9 % 100 mL IVPB  1 g Intravenous Q24H Howerter, Justin B, DO      . dextrose 5 % in lactated ringers infusion   Intravenous Continuous Regalado, Belkys A, MD 50 mL/hr at 04/08/20 0820 New Bag at 04/08/20 0820  . LORazepam (ATIVAN) tablet 1-4 mg  1-4 mg Oral Q1H PRN Howerter, Justin B, DO       Or  . LORazepam (ATIVAN) injection 1-4 mg  1-4 mg Intravenous Q1H PRN Howerter, Justin B, DO      . octreotide (SANDOSTATIN) 500 mcg in sodium chloride 0.9 % 250 mL (2 mcg/mL) infusion  50 mcg/hr Intravenous Continuous Howerter, Justin B, DO 25 mL/hr at 04/08/20 0636 50 mcg/hr at 04/08/20 0636  . pantoprazole (PROTONIX) 80 mg in sodium chloride 0.9 % 100 mL (0.8 mg/mL) infusion  8 mg/hr Intravenous Continuous Howerter, Justin B, DO 10 mL/hr at 04/08/20 0635 8 mg/hr at 04/08/20 0635   Current Outpatient Medications  Medication Sig Dispense Refill  . calcium carbonate (OSCAL) 1500 (600 Ca) MG TABS tablet Take 600 mg by mouth daily.    . folic acid (FOLVITE) 1 MG tablet Take 1 tablet (1 mg total) by mouth daily. 30 tablet 4  . lidocaine-prilocaine (EMLA) cream Apply 1 application topically as needed. 30 g 2  . potassium chloride SA (KLOR-CON) 20 MEQ tablet Take 1 tablet (20 mEq total) by mouth daily. 10 tablet 0  . prochlorperazine (COMPAZINE) 10 MG tablet Take 1 tablet (10 mg total) by mouth every 6 (six) hours as  needed for nausea or vomiting. 30 tablet 3    Allergies as of 04/07/2020 - Review Complete 04/07/2020  Allergen Reaction Noted  . Aspirin adult low [aspirin]  02/25/2012     Review of Systems:    Constitutional: No fever or chills Skin: No rash  Cardiovascular: No chest pain Respiratory: No SOB Gastrointestinal: See HPI and otherwise negative Genitourinary: No dysuria Neurological: No syncope Musculoskeletal: No new muscle or joint pain Hematologic: No bruising Psychiatric: No history of depression or anxiety    Physical Exam:  Vital signs in last 24 hours: Temp:  [97.8 F (36.6 C)-98.4 F (  36.9 C)] 98.2 F (36.8 C) (01/17 0636) Pulse Rate:  [72-103] 78 (01/17 0823) Resp:  [8-27] 16 (01/17 0800) BP: (80-127)/(45-99) 118/79 (01/17 0823) SpO2:  [100 %] 100 % (01/17 0800)   General:   AA female appears to be in NAD, Well developed, Well nourished, alert and cooperative Head:  Normocephalic and atraumatic. Eyes:   PEERL, EOMI. No icterus. Conjunctiva pink. Ears:  Normal auditory acuity. Neck:  Supple Throat: Oral cavity and pharynx without inflammation, swelling or lesion.  Lungs: Respirations even and unlabored. Lungs clear to auscultation bilaterally.   No wheezes, crackles, or rhonchi.  Heart: Normal S1, S2. No MRG. Regular rate and rhythm. No peripheral edema, cyanosis or pallor.  Abdomen:  Soft, nondistended, nontender. No rebound or guarding. Normal bowel sounds. No appreciable masses or hepatomegaly. Rectal:  Not performed.  Msk:  Symmetrical without gross deformities. Peripheral pulses intact.  Extremities:  Without edema, no deformity or joint abnormality.  Neurologic:  Alert and  oriented x3;  grossly normal neurologically.  Skin:   Dry and intact without significant lesions or rashes. Psychiatric: Demonstrates good judgement and reason without abnormal affect or behaviors. +poor historian   LAB RESULTS: Recent Labs    04/07/20 1738  WBC 2.0*  HGB 4.9*   HCT 14.4*  PLT 37*   BMET Recent Labs    04/07/20 1738 04/08/20 0548  NA 135 137  K 3.2* 4.4  CL 101 105  CO2 22 21*  GLUCOSE 92 63*  BUN 7 5*  CREATININE 1.08* 1.12*  CALCIUM 8.0* 7.7*   LFT Recent Labs    04/08/20 0548  PROT 5.5*  ALBUMIN 1.8*  AST 162*  ALT 97*  ALKPHOS 119  BILITOT 0.6   PT/INR Recent Labs    04/08/20 0548  LABPROT 13.8  INR 1.1   Ammonia 26  STUDIES: CT Head Wo Contrast  Result Date: 04/07/2020 CLINICAL DATA:  Confusion x2 days.  History of stage IV lung cancer. EXAM: CT HEAD WITHOUT CONTRAST TECHNIQUE: Contiguous axial images were obtained from the base of the skull through the vertex without intravenous contrast. COMPARISON:  September 11, 2019 FINDINGS: Brain: There is mild cerebral atrophy with widening of the extra-axial spaces and ventricular dilatation. There are areas of decreased attenuation within the white matter tracts of the supratentorial brain, consistent with microvascular disease changes. Vascular: No hyperdense vessel or unexpected calcification. Skull: Normal. Negative for fracture or focal lesion. Sinuses/Orbits: There is mild left ethmoid sinus mucosal thickening. Other: None. IMPRESSION: 1. Generalized cerebral atrophy. 2. No acute intracranial abnormality. 3. Mild left ethmoid sinus disease. Electronically Signed   By: Thaddeus  Houston M.D.   On: 04/07/2020 17:19    Impression / Plan:   Impression: 1.  Upper GI bleed: Patient reports black stools off and on for a year (she is a poor historian), now with acute on chronic anemia and a rectal exam in the ED revealing melena, hemoglobin 4.9 (8.5-10 at baseline), does report Goody powder usage and alcohol abuse; consider bleeding varices versus PUD versus other 2.  Acute on chronic anemia: Likely from above 3.  Acute encephalopathy: At time of admission, but appears to be clearing 4.  Acute on chronic pancytopenia: Currently on chemotherapy for lung cancer 5.  Hypokalemia:  Improved this morning 6.  Polysubstance abuse: Positive for cocaine and history of alcohol abuse with 10-12 beers per day per family members  Plan: 1.  Patient will need an EGD for further evaluation of melena and acute anemia,   but will leave timing of this up to Dr. Danis.  Her last platelets were only 37, CBC is pending from this morning.  She is also now status post 3 units of PRBCs with initial hemoglobin of 4.9, but again recheck is pending. 2.  Agree with Protonix drip 3.  Agree with empiric Rocephin for SBP prophylaxis 4.  Patient can remain n.p.o. for now, though if we do not plan on doing procedures today she can likely have clears 5.  Agree with CIWA protocol for alcohol withdrawal 6.  Continue to monitor hemoglobin with transfusion as needed less than 7 7.  Please await any further recommendations from Dr. Danis later today.  Thank you for your kind consultation, we will continue to follow.  Jennifer Lynne Lemmon  04/08/2020, 8:53 AM   Addendum: 1000 am 04/08/20 Attempted to call niece Amanda Eggleston as listed in contacts.  No answer x2. LMOM to call back in regards to consent for EGD.  JLL _______________________________  I have reviewed the entire case in detail with the above APP and discussed the plan in detail.  Therefore, I agree with the diagnoses recorded above. In addition,  I have personally interviewed and examined the patient and have personally reviewed any abdominal/pelvic CT scan images.  My additional thoughts are as follows:  Acute on chronic macrocytic anemia.  Reported melena, unknown how long this has been occurring.  Hemoglobin is significantly below her recent baseline, indicating acute GI bleeding in recent days.  Marked thrombocytopenia, some likely chronic but also acute from consumption due to bleeding. Alcohol and cocaine use, chronic pancytopenia raises suspicion for alcohol-related marrow suppression and/or possible cirrhosis.  No obvious exam  stigmata of cirrhosis.  Recent chemotherapy for advanced lung cancer also likely contributing (though patient has been on this cyclic drug regimen 6 months - recent oncology office note reviewed.) Fortunately, she is hemodynamically stable and has received 2 units of PRBCs overnight.  She is on a Protonix drip as well as octreotide and antibiotics.  There was clinical concern for cirrhosis as noted above, so the octreotide was started in case this is variceal bleeding.  We will continue that therapy for now, though I am more suspicious for peptic ulcer given her chronic NSAID use and polysubstance abuse then I am of variceal bleeding given her overall clinical scenario and hemodynamic stability at present.  Repeat stat CBC is pending, after which we will make a decision on timing of upper endoscopy and possible need for platelet transfusion.  Patient was reportedly ordered for 3 units of PRBCs, but blood bank released 2 units due to current PRBC shortage and required repeat CBC before any further PRBCs released.   At the time of my evaluation, this patient does not know the month and year, and believes she is at [place] hospital.  She initially told me she had no family in the area, then when I brought up the fact that she was found sick by her niece, she then recalled she has a niece and perhaps her brother in the area.  She also tells me she lives [place] which may be a local motel/hotel. I think her mental status precludes adequate informed consent for an endoscopic procedure, so we have a call out to her family for discussion consent.  If she becomes unstable and requires an emergent procedure, we would need emergent provider consent.  Because of altered mental status unclear, duration also unknown.  History of polysubstance abuse.    Normal ammonia level speaks against hepatic encephalopathy.  She was significantly hypoglycemic this morning, but after D5LR fluids her hemoglobin was  reportedly improved to 78 (after speaking with her bedside nurse), and mental status was also altered with a glucose in the 90s on admission last evening.  Patient at increased risk for cardiopulmonary complications of procedure due to medical comorbidities.   Further plan to follow   Janyah Singleterry L Danis III Office:[phone]   

## 2020-04-08 NOTE — Progress Notes (Signed)
Telephone call: 11:30 AM 04/08/2020  Spoke with patient's niece who is a Designer, jewellery on the phone.  She explained that she thinks patient definitely needs an EGD for further evaluation.  She will be available for [place] to call and consent later today for procedure either today or tomorrow.  Ordered stat CBC to see status of platelets.  Ellouise Newer, PA-C

## 2020-04-08 NOTE — ED Notes (Signed)
Pt getting blood labs can not be drawn at this time.

## 2020-04-09 ENCOUNTER — Encounter (HOSPITAL_COMMUNITY)
Admission: EM | Disposition: A | Discharge status: Home Health | Payer: Self-pay | Source: Home / Self Care | Attending: Internal Medicine

## 2020-04-09 ENCOUNTER — Inpatient Hospital Stay (HOSPITAL_COMMUNITY): Payer: Medicaid Other | Admitting: Certified Registered Nurse Anesthetist

## 2020-04-09 ENCOUNTER — Encounter (HOSPITAL_COMMUNITY): Payer: Self-pay | Admitting: Internal Medicine

## 2020-04-09 DIAGNOSIS — K2971 Gastritis, unspecified, with bleeding: Secondary | ICD-10-CM

## 2020-04-09 HISTORY — PX: ESOPHAGOGASTRODUODENOSCOPY (EGD) WITH PROPOFOL: SHX5813

## 2020-04-09 LAB — BPAM RBC
Blood Product Expiration Date: 202202162359
Blood Product Expiration Date: 202202182359
ISSUE DATE / TIME: 202201170222
ISSUE DATE / TIME: 202201170616
Unit Type and Rh: 5100
Unit Type and Rh: 5100

## 2020-04-09 LAB — COMPREHENSIVE METABOLIC PANEL
ALT: 108 U/L — ABNORMAL HIGH (ref 0–44)
AST: 144 U/L — ABNORMAL HIGH (ref 15–41)
Albumin: 1.7 g/dL — ABNORMAL LOW (ref 3.5–5.0)
Alkaline Phosphatase: 114 U/L (ref 38–126)
Anion gap: 8 (ref 5–15)
BUN: <5 mg/dL — ABNORMAL LOW (ref 6–20)
CO2: 23 mmol/L (ref 22–32)
Calcium: 8 mg/dL — ABNORMAL LOW (ref 8.9–10.3)
Chloride: 106 mmol/L (ref 98–111)
Creatinine, Ser: 1 mg/dL (ref 0.44–1.00)
GFR, Estimated: >60 mL/min (ref >60)
Glucose, Bld: 128 mg/dL — ABNORMAL HIGH (ref 70–99)
Potassium: 3.6 mmol/L (ref 3.5–5.1)
Sodium: 137 mmol/L (ref 135–145)
Total Bilirubin: 1 mg/dL (ref 0.3–1.2)
Total Protein: 5.6 g/dL — ABNORMAL LOW (ref 6.5–8.1)

## 2020-04-09 LAB — GLUCOSE, CAPILLARY
Glucose-Capillary: 109 mg/dL — ABNORMAL HIGH (ref 70–99)
Glucose-Capillary: 110 mg/dL — ABNORMAL HIGH (ref 70–99)
Glucose-Capillary: 110 mg/dL — ABNORMAL HIGH (ref 70–99)
Glucose-Capillary: 117 mg/dL — ABNORMAL HIGH (ref 70–99)
Glucose-Capillary: 120 mg/dL — ABNORMAL HIGH (ref 70–99)

## 2020-04-09 LAB — CBC WITH DIFFERENTIAL/PLATELET
Abs Immature Granulocytes: 0.01 10*3/uL (ref 0.00–0.07)
Basophils Absolute: 0 10*3/uL (ref 0.0–0.1)
Basophils Relative: 0 %
Eosinophils Absolute: 0 10*3/uL (ref 0.0–0.5)
Eosinophils Relative: 0 %
HCT: 24.1 % — ABNORMAL LOW (ref 36.0–46.0)
Hemoglobin: 8.3 g/dL — ABNORMAL LOW (ref 12.0–15.0)
Immature Granulocytes: 0 %
Lymphocytes Relative: 50 %
Lymphs Abs: 1.1 10*3/uL (ref 0.7–4.0)
MCH: 34.6 pg — ABNORMAL HIGH (ref 26.0–34.0)
MCHC: 34.4 g/dL (ref 30.0–36.0)
MCV: 100.4 fL — ABNORMAL HIGH (ref 80.0–100.0)
Monocytes Absolute: 0.4 10*3/uL (ref 0.1–1.0)
Monocytes Relative: 19 %
Neutro Abs: 0.7 10*3/uL — ABNORMAL LOW (ref 1.7–7.7)
Neutrophils Relative %: 31 %
Platelets: 44 10*3/uL — ABNORMAL LOW (ref 150–400)
RBC: 2.4 MIL/uL — ABNORMAL LOW (ref 3.87–5.11)
RDW: 23.4 % — ABNORMAL HIGH (ref 11.5–15.5)
WBC: 2.2 10*3/uL — ABNORMAL LOW (ref 4.0–10.5)
nRBC: 0 % (ref 0.0–0.2)

## 2020-04-09 LAB — TYPE AND SCREEN
ABO/RH(D): O POS
Antibody Screen: POSITIVE
Donor AG Type: NEGATIVE
Donor AG Type: NEGATIVE
PT AG Type: POSITIVE
Unit division: 0
Unit division: 0

## 2020-04-09 LAB — CBC
HCT: 24.9 % — ABNORMAL LOW (ref 36.0–46.0)
Hemoglobin: 8.6 g/dL — ABNORMAL LOW (ref 12.0–15.0)
MCH: 34.7 pg — ABNORMAL HIGH (ref 26.0–34.0)
MCHC: 34.5 g/dL (ref 30.0–36.0)
MCV: 100.4 fL — ABNORMAL HIGH (ref 80.0–100.0)
Platelets: 68 10*3/uL — ABNORMAL LOW (ref 150–400)
RBC: 2.48 MIL/uL — ABNORMAL LOW (ref 3.87–5.11)
RDW: 22.6 % — ABNORMAL HIGH (ref 11.5–15.5)
WBC: 2.9 10*3/uL — ABNORMAL LOW (ref 4.0–10.5)
nRBC: 0 % (ref 0.0–0.2)

## 2020-04-09 LAB — PATHOLOGIST SMEAR REVIEW

## 2020-04-09 SURGERY — ESOPHAGOGASTRODUODENOSCOPY (EGD) WITH PROPOFOL
Anesthesia: Monitor Anesthesia Care

## 2020-04-09 MED ORDER — GLYCOPYRROLATE 0.2 MG/ML IJ SOLN
INTRAMUSCULAR | Status: DC | PRN
  Administered 2020-04-09: .2 mg via INTRAVENOUS

## 2020-04-09 MED ORDER — FOLIC ACID 5 MG/ML IJ SOLN
1.0000 mg | Freq: Every day | INTRAMUSCULAR | Status: DC
  Administered 2020-04-09 – 2020-04-11 (×3): 1 mg via INTRAVENOUS
  Filled 2020-04-09 (×4): qty 0.2

## 2020-04-09 MED ORDER — TBO-FILGRASTIM 300 MCG/0.5ML ~~LOC~~ SOSY
300.0000 ug | PREFILLED_SYRINGE | Freq: Once | SUBCUTANEOUS | Status: AC
  Administered 2020-04-09: 300 ug via SUBCUTANEOUS
  Filled 2020-04-09: qty 0.5

## 2020-04-09 MED ORDER — PROPOFOL 500 MG/50ML IV EMUL
INTRAVENOUS | Status: DC | PRN
  Administered 2020-04-09: 50 ug/kg/min via INTRAVENOUS

## 2020-04-09 MED ORDER — PROPOFOL 10 MG/ML IV BOLUS
INTRAVENOUS | Status: DC | PRN
  Administered 2020-04-09: 50 mg via INTRAVENOUS

## 2020-04-09 MED ORDER — SODIUM CHLORIDE 0.9% IV SOLUTION: Freq: Once | INTRAVENOUS | Status: AC

## 2020-04-09 MED ORDER — LACTATED RINGERS IV SOLN: INTRAVENOUS | Status: DC | PRN

## 2020-04-09 SURGICAL SUPPLY — 15 items

## 2020-04-09 NOTE — Progress Notes (Signed)
OT Cancellation Note  Patient Details Name: Brenda [name] MRN: [id number] DOB: November 22, 1960   Cancelled Treatment:    Reason Eval/Treat Not Completed: Patient not medically ready (Pt on bed rest with bathroom privileges, but platelets and RBCs continue to be very low. OT to continue to follow for OT eval.)   Jefferey Pica, OTR/L Acute Rehabilitation Services Pager: [phone] Office: [phone]  Traylen Eckels C 04/09/2020, 7:51 AM

## 2020-04-09 NOTE — Evaluation (Signed)
Physical Therapy Evaluation Patient Details Name: Brenda [name] MRN: [id number] DOB: 1960-09-07 Today's Date: 04/09/2020   History of Present Illness  60 y.o. female with medical history significant for stage IV adenocarcinoma of the right lung on chemotherapy, chronic anemia with baseline hemoglobin 8.5-10, HIV, chronic alcohol abuse, who is admitted to [facility] on 04/07/2020 with concerned for acute upper GI bleed after presenting from home via EMS for evaluation of confusion. Pt found to have Hgb of 4.9 and was given 3 units PRBC s/p 04/09/20 EGD where she ws found to have erosive gastropathy with no bleeding, gastritis with hemorrhage, and deudenitits.  Clinical Impression  PTA pt reports living with her significant other in hotel room, with 14 steps to enter. Pt reports that she was independent in mobility, although sometimes had difficulty climbing the steps and frequently "blacks out". Pt reports she is independent in ADLs and iADLs. Pt is min guard for bed mobility, and power up into standing, with standing though she becomes dizzy and requires assist to safely sit back down. BP 136/95. Hgb was 8.3 this morning. Pt then requests to use BSC and is min A for stand pivot to and from [facility]. PT recommends HHPT at discharge to improve strength, balance and endurance. PT will continue to follow acutely.    Follow Up Recommendations Home health PT;Supervision for mobility/OOB    Equipment Recommendations  Rolling walker with 5" wheels       Precautions / Restrictions Precautions Precautions: Fall (dizziness) Restrictions Weight Bearing Restrictions: No      Mobility  Bed Mobility Overal bed mobility: Needs Assistance Bed Mobility: Supine to Sit;Sit to Supine     Supine to sit: Min guard Sit to supine: Min guard   General bed mobility comments: min guard for safety with coming to EoB, and for return to supine    Transfers Overall transfer level: Needs assistance Equipment  used: None Transfers: Sit to/from [facility] Sit to Stand: Min assist Stand pivot transfers: Min assist       General transfer comment: pt able to stand without assist however feels dizzy on standing and requires steadying and assist back to sitting EoB, BP WNL, pt requests use of BSC, minA for stand pivot <>BSC  Ambulation/Gait             General Gait Details: deferred due to dizziness         Balance Overall balance assessment: Needs assistance Sitting-balance support: No upper extremity supported;Feet supported Sitting balance-Leahy Scale: Good     Standing balance support: Bilateral upper extremity supported;No upper extremity supported;Single extremity supported;During functional activity Standing balance-Leahy Scale: Poor Standing balance comment: poor due to dizziness and outside assist required to steady with dizziness                             Pertinent Vitals/Pain Pain Assessment: 0-10 Pain Score: 9  Pain Location: shoulders Pain Descriptors / Indicators: Aching;Sore Pain Intervention(s): Limited activity within patient's tolerance;Monitored during session    [place] expects to be discharged to:: Other (Comment) (motel room) Living Arrangements: Spouse/significant other   Type of Home: Other(Comment) Home Access: Stairs to enter Entrance Stairs-Rails: Right;Left;Can reach both Entrance Stairs-Number of Steps: 14 Home Layout: One level Home Equipment: None Additional Comments: lives at hotel    Prior Function Level of Independence: [place]  Dominant Hand: Right    Extremity/Trunk Assessment   Upper Extremity Assessment Upper Extremity Assessment: Generalized weakness    Lower Extremity Assessment Lower Extremity Assessment: Generalized weakness       Communication   Communication: No difficulties  Cognition Arousal/Alertness: Awake/alert Behavior  During Therapy: WFL for tasks assessed/performed Overall Cognitive Status: Impaired/Different from baseline Area of Impairment: Orientation;Problem solving                 Orientation Level: Disoriented to;Place           Problem Solving: Slow processing;Requires verbal cues;Requires tactile cues        General Comments General comments (skin integrity, edema, etc.): BP 136/95, SaO2 92%O2 on RA, HR 94 bpm        Assessment/Plan    PT Assessment Patient needs continued PT services  PT Problem List Decreased activity tolerance;Decreased balance;Decreased mobility;Decreased safety awareness;Cardiopulmonary status limiting activity       PT Treatment Interventions DME instruction;Gait training;Stair training;Functional mobility training;Therapeutic activities;Therapeutic exercise;Balance training;Cognitive remediation;Patient/family education    PT Goals (Current goals can be found in the Care Plan section)  Acute Rehab PT Goals Patient Stated Goal: have less dizziness "blacking out" PT Goal Formulation: With patient Time For Goal Achievement: 04/23/20 Potential to Achieve Goals: Fair    Frequency Min 3X/week    AM-PAC PT "6 Clicks" Mobility  Outcome Measure Help needed turning from your back to your side while in a flat bed without using bedrails?: None Help needed moving from lying on your back to sitting on the side of a flat bed without using bedrails?: None Help needed moving to and from a bed to a chair (including a wheelchair)?: A Little Help needed standing up from a chair using your arms (e.g., wheelchair or bedside chair)?: A Little Help needed to walk in hospital room?: A Little Help needed climbing 3-5 steps with a railing? : A Lot 6 Click Score: 19    End of Session Equipment Utilized During Treatment: Gait belt Activity Tolerance: Treatment limited secondary to medical complications (Comment) (dizziness) Patient left: in bed;with call bell/phone  within reach;with bed alarm set Nurse Communication: Mobility status PT Visit Diagnosis: Unsteadiness on feet (R26.81);Other abnormalities of gait and mobility (R26.89);Muscle weakness (generalized) (M62.81);Dizziness and giddiness (R42);Difficulty in walking, not elsewhere classified (R26.2)    Time: 1443-1540 PT Time Calculation (min) (ACUTE ONLY): 23 min   Charges:   PT Evaluation $PT Eval Moderate Complexity: 1 Mod PT Treatments $Therapeutic Activity: 8-22 mins        Tyreshia Ingman B. Migdalia Dk PT, DPT Acute Rehabilitation Services Pager [phone] Office [phone]   [place] 04/09/2020, 5:05 PM

## 2020-04-09 NOTE — Op Note (Signed)
[facility] Patient Name: Brenda [name] Procedure Date : 04/09/2020 MRN: [id number] Attending MD: Estill Cotta. Loletha Carrow , MD Date of Birth: 11-07-1960 CSN: [id number] Age: 60 Admit Type: Inpatient Procedure:                Upper GI endoscopy Indications:              Acute post hemorrhagic anemia, Melena                            (pancytopenic, recent CTX for lung cancer) Providers:                Estill Cotta. Loletha Carrow, MD, Nelia Shi, RN, Kary Kos RN, RN, Ladona Ridgel, Technician, Cira Servant, CRNA Referring MD:             [facility] Medicines:                Monitored Anesthesia Care Complications:            No immediate complications. Estimated Blood Loss:     Estimated blood loss was minimal. Procedure:                Pre-Anesthesia Assessment:                           - Prior to the procedure, a History and Physical                            was performed, and patient medications and                            allergies were reviewed. The patient's tolerance of                            previous anesthesia was also reviewed. The risks                            and benefits of the procedure and the sedation                            options and risks were discussed with the patient.                            All questions were answered, and informed consent                            was obtained. Prior Anticoagulants: The patient has                            taken no previous anticoagulant or antiplatelet  agents. ASA Grade Assessment: IV - A patient with                            severe systemic disease that is a constant threat                            to life. After reviewing the risks and benefits,                            the patient was deemed in satisfactory condition to                            undergo the procedure.                           After  obtaining informed consent, the endoscope was                            passed under direct vision. Throughout the                            procedure, the patient's blood pressure, pulse, and                            oxygen saturations were monitored continuously. The                            GIF-H190 ([id number]) Olympus gastroscope was                            introduced through the mouth, and advanced to the                            second part of duodenum. The upper GI endoscopy was                            accomplished without difficulty. The patient                            tolerated the procedure fairly well. Scope In: Scope Out: Findings:      The esophagus was normal.      Patchy atrophic mucosa was found in the gastric body and in the gastric       antrum.      A single small erosion with no bleeding and no stigmata of recent       bleeding was found in the prepyloric region of the stomach.      Patchy inflammation with scant contact multifocal oozing characterized       by adherent blood, congestion (edema) and friability and nodularity was       found in the gastric fundus and on the greater curvature of the gastric       body. No fresh or old blood seen upon entering stomach. Brief scant       oozing with scope contact or lavage. No lesion amenable to endoscopic  hemostasis. No biopsy taken due to severe thrombocytopenia and risk of       causing bleeding.      The exam of the stomach was otherwise normal.      Patchy mild inflammation characterized by erythema was found in the       duodenal bulb.      The exam of the duodenum was otherwise normal. Impression:               - Normal esophagus.                           - Gastric mucosal atrophy.                           - Erosive gastropathy with no bleeding and no                            stigmata of recent bleeding.                           - Gastritis with hemorrhage.                           -  Duodenitis.                           - No specimens collected.                           Multifactorial anemia ( GI Bleed, alcohol,                            chemotherapy),pancytopenia. Gastritis also likely                            from similar factors and ? H pylori. Moderate Sedation:      MAC sedation used Recommendation:           - Return patient to hospital ward for ongoing care.                           - Resume regular diet.                           - Continue pantoprazole drip another 24 hours (then                            most likely transition to oral twice daily therapy)                           Octreotide will be discontinued.                           CBC tomorrow AM.                           If recurrent overt bleeding, keep platelet count >  50K with transfusion.                           H pylori serum antibody. Treat if positive.                           No colonoscopy planned presently as apparent source                            of GI bleeding identified and increased risk for                            endoscopic procedures in current condition. Procedure Code(s):        --- Professional ---                           [phone], Esophagogastroduodenoscopy, flexible,                            transoral; diagnostic, including collection of                            specimen(s) by brushing or washing, when performed                            (separate procedure) Diagnosis Code(s):        --- Professional ---                           K31.89, Other diseases of stomach and duodenum                           K29.71, Gastritis, unspecified, with bleeding                           K29.80, Duodenitis without bleeding                           D62, Acute posthemorrhagic anemia                           K92.1, Melena (includes Hematochezia) CPT copyright 2019 American Medical Association. All rights reserved. The codes documented in this  report are preliminary and upon coder review may  be revised to meet current compliance requirements. [name], MD 04/09/2020 1:47:56 PM This report has been signed electronically. Number of Addenda: 0

## 2020-04-09 NOTE — Interval H&P Note (Signed)
History and Physical Interval Note:  04/09/2020 12:49 PM  Brenda [name]  has presented today for surgery, with the diagnosis of Anemia, Melena.  The various methods of treatment have been discussed with the patient and family. After consideration of risks, benefits and other options for treatment, the patient has consented to  Procedure(s): ESOPHAGOGASTRODUODENOSCOPY (EGD) WITH PROPOFOL (N/A) as a surgical intervention.  The patient's history has been reviewed, patient examined, no change in status, stable for surgery.  I have reviewed the patient's chart and labs.  Questions were answered to the patient's satisfaction.    Patient has remained hemodynamically stable since yesterday.  Hgb stable since transfusion, remains pancytopenic with platelets 44K - therefore platelet transfusion given earlier today.  Patient alert and conversational, no distress. Proceeding with EGD.  [name]

## 2020-04-09 NOTE — Progress Notes (Signed)
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  Patient at baseline is alert and oriented x4 without any significant confusion, family noticed that patient has been confused.  Family also acknowledge that patient "smoked crack" earlier in the day on 04/07/2020.  Patient admitted with severe pancytopenia with a hemoglobin of 4.9, platelet of 37, concern for GI bleed, GI was consulted the patient was started on Protonix drip, octreotide tried, IV antibiotics.  Plan for endoscopy 1/18.   Assessment & Plan:   Principal Problem:   Acute upper GI bleed Active Problems:   Alcohol abuse   Hypokalemia   Acute on chronic anemia   Acute encephalopathy   Pancytopenia (HCC)   Polysubstance abuse (HCC)  1-Acute upper GI bleed: Presents with melena revealed on rectal exam, hemoglobin of 4.9. Continue with IV Protonix drip.  Octreotide and IV ceftriaxone, to cover for varices bleed due to history of alcohol.  Patient received 2 unit of packed red blood cell.  hemoglobin  increased to 9.3---8.3 Continue to cycle hemoglobin. Plan for endoscopy 1/18. Plan to received platelet today prior to endoscopy.   2-Pancytopenia, severe anemia: Acute blood loss anemia: -Dr. Earlie Server will follow-up in consultation.  He thinks patient presentation could be related to GI bleed. -Continue with blood transfusion as needed. -platelet transfusion today.   3-Acute metabolic encephalopathy; CT head negative for acute intracranial abnormality. Presentation could be related to alcohol use,  drug abuse, anemia. B12; elevated.  MRI only showed old stroke.  No fevers or renal failure. Ammonia level normal.  Patient is alert and oriented times 3. Improved.   4-Hypokalemia;  Replaced.   5-Chronic alcohol abuse; polysubstance abuse.  -Continue with CIWA.  Thiamine and folic acid.  -she will need counseling, when confusion improved.   6-Hypoglycemia; Started D 5.  Improved.  Likely secondary to poor oral intake.   Transaminases; probably secondary to alcohol use.   Estimated body mass index is 20.6 kg/m as calculated from the following:   Height as of this encounter: 5\' 6"  (1.676 m).   Weight as of this encounter: 57.9 kg.   DVT prophylaxis: SCD Code Status: Full code Family Communication: Care discussed with niece 1/17. Disposition Plan:  Status is: Inpatient  Remains inpatient appropriate because:Hemodynamically unstable   Dispo: The patient is from: Home              Anticipated d/c is to: to be determine              Anticipated d/c date is: 3 days              Patient currently is not medically stable to d/c.        Consultants:   GI  [name].   Procedures:     Antimicrobials:  Ceftriaxone 2 gr   Subjective: She is alert this morning. Oriented times 3. She was able to tell me about her medical problems, her lung cancer diagnosis, Who is her oncologist and she is on chemo.  We talk about she should abstinence from  alcohol and drugs.  She report black stool for  one week.   Objective: Vitals:   04/08/20 1900 04/08/20 2000 04/08/20 2348 04/09/20 0300  BP:  (!) 133/108 139/86 (!) 147/87  Pulse:  85 83 78  Resp:  14 16 17   Temp:  98.3 F (36.8 C) 98.1 F (36.7 C) 98.3 F (36.8 C)  TempSrc:  Oral Oral Oral  SpO2:  100% 100% 98%  Weight: 57.9 kg     Height: 5\' 6"  (1.676 m)       Intake/Output Summary (Last 24 hours) at 04/09/2020 0641 Last data filed at 04/08/2020 2348 Gross per 24 hour  Intake 1142.39 ml  Output 900 ml  Net  242.39 ml   Filed Weights   04/08/20 1900  Weight: 57.9 kg    Examination:  General exam: NAD Respiratory system: CTA Cardiovascular system: S 1, S  2 RRR Gastrointestinal system: BS present, soft, NT Central nervous system: Alert, oriented times 3.  Extremities: No edema    Data Reviewed: I have personally reviewed following labs and imaging studies  CBC: Recent Labs  Lab 04/07/20 1738 04/08/20 1025 04/08/20 1607 04/08/20 2021 04/09/20 0400  WBC 2.0*  --  2.4* 2.3* 2.2*  NEUTROABS 0.6*  --  0.7* 0.7* 0.7*  HGB 4.9* 7.8* 10.8* 9.3* 8.3*  HCT 14.4* 23.3* 32.9* 27.2* 24.1*  MCV 114.3*  --  100.9* 100.4* 100.4*  PLT 37*  --  42* 44* 44*   Basic Metabolic Panel: Recent Labs  Lab 04/07/20 1738 04/08/20 0548 04/08/20 1305 04/09/20 0400  NA 135 137 140 137  K 3.2* 4.4 4.2 3.6  CL 101 105 107 106  CO2 22 21* 23 23  GLUCOSE 92 63* 108* 128*  BUN 7 5* <5* <5*  CREATININE 1.08* 1.12* 1.14* 1.00  CALCIUM 8.0* 7.7* 8.0* 8.0*  MG  --  2.1 1.9  --   PHOS  --  3.8 3.7  --    GFR: Estimated Creatinine Clearance: 56.7 mL/min (by C-G formula based on SCr of 1 mg/dL). Liver Function Tests: Recent Labs  Lab 04/07/20 1738 04/08/20 0548 04/08/20 1305 04/09/20 0400  AST 123* 162* 224* 144*  ALT 84* 97* 128* 108*  ALKPHOS 138* 119 134* 114  BILITOT 0.6 0.6 1.5* 1.0  PROT 6.0* 5.5* 6.2* 5.6*  ALBUMIN 2.0* 1.8* 2.0* 1.7*   No results for input(s): LIPASE, AMYLASE in the last 168 hours. Recent Labs  Lab 04/08/20 0520  AMMONIA 26   Coagulation Profile: Recent Labs  Lab 04/08/20 0548 04/08/20 1305  INR 1.1 1.1   Cardiac Enzymes: No results for input(s): CKTOTAL, CKMB, CKMBINDEX, TROPONINI in the last 168 hours. BNP (last 3 results) No results for input(s): PROBNP in the last 8760 hours. HbA1C: No results for input(s): HGBA1C in the last 72 hours. CBG: Recent Labs  Lab 04/08/20 0755 04/08/20 1230 04/08/20 1540 04/08/20 2055 04/08/20 2345  GLUCAP 74 100*  99 136* 119*   Lipid Profile: No results for input(s): CHOL, HDL, LDLCALC, TRIG, CHOLHDL, LDLDIRECT in the last 72 hours. Thyroid Function Tests: No results for input(s): TSH, T4TOTAL, FREET4, T3FREE, THYROIDAB in the last 72 hours. Anemia Panel: Recent Labs    04/08/20 1305  VITAMINB12 1,959*  FOLATE 29.9   Sepsis Labs: No results for input(s): PROCALCITON, LATICACIDVEN in the last 168 hours.  Recent Results (from the past 240 hour(s))  SARS CORONAVIRUS 2 (TAT 6-24 HRS) Nasopharyngeal Nasopharyngeal Swab     Status: None   Collection Time: 04/07/20  9:14 PM   Specimen: Nasopharyngeal Swab  Result Value Ref Range Status   SARS Coronavirus 2 NEGATIVE NEGATIVE Final    Comment: (NOTE) SARS-CoV-2 target nucleic acids are NOT DETECTED.  The SARS-CoV-2 RNA is generally detectable in upper and lower respiratory specimens during the acute phase of infection. Negative results do not preclude SARS-CoV-2 infection, do not rule out co-infections with other pathogens, and should not be used as the sole basis for treatment or other patient management decisions. Negative results must be combined with clinical observations, patient history, and epidemiological information. The expected result is Negative.  Fact Sheet for Patients: SugarRoll.be  Fact Sheet for Healthcare Providers: https://www.woods-mathews.com/  This test is not yet approved or cleared by the [place] FDA and  has been authorized for detection and/or diagnosis of SARS-CoV-2 by FDA under an Emergency Use Authorization (EUA). This EUA will remain  in effect (meaning this test can be used) for the duration of the COVID-19 declaration under Se ction 564(b)(1) of the Act, 21 U.S.C. section 360bbb-3(b)(1), unless the authorization is terminated or revoked sooner.  Performed at [facility], [place] [address] [place], [place] [address]   MRSA PCR Screening     Status:  Abnormal   Collection Time: 04/08/20 12:28 PM   Specimen: Nasal Mucosa; Nasopharyngeal  Result Value Ref Range Status   MRSA by PCR POSITIVE (A) NEGATIVE Final    Comment:        The GeneXpert MRSA Assay (FDA approved for NASAL specimens only), is one component of a comprehensive MRSA colonization surveillance program. It is not intended to diagnose MRSA infection nor to guide or monitor treatment for MRSA infections. RESULT CALLED TO, READ BACK BY AND VERIFIED WITH: Laurette Schimke RN 15:20 04/08/20 ([email or web address]) Performed at [facility], [place] [address] [place],  [address]          Radiology Studies: CT Head Wo Contrast  Result Date: 04/07/2020 CLINICAL DATA:  Confusion x2 days.  History of stage IV lung cancer. EXAM: CT HEAD WITHOUT CONTRAST TECHNIQUE: Contiguous axial images were obtained from the base of the skull through the vertex without intravenous contrast. COMPARISON:  September 11, 2019 FINDINGS: Brain: There is mild cerebral atrophy with widening of the extra-axial spaces and ventricular dilatation. There are areas of decreased attenuation within the white matter tracts of the supratentorial brain, consistent with microvascular disease changes. Vascular: No hyperdense vessel or unexpected calcification. Skull: Normal. Negative for fracture or focal lesion. Sinuses/Orbits: There is mild left ethmoid sinus mucosal thickening. Other: None. IMPRESSION: 1. Generalized cerebral atrophy. 2. No acute intracranial abnormality. 3. Mild left ethmoid sinus disease. Electronically Signed   By: Virgina Norfolk M.D.   On: 04/07/2020 17:19   MR BRAIN WO CONTRAST  Result Date: 04/08/2020 CLINICAL DATA:  Initial evaluation for delirium, dizziness. EXAM: MRI HEAD WITHOUT CONTRAST TECHNIQUE: Multiplanar, multiecho pulse sequences of the brain and surrounding structures were obtained without intravenous contrast. COMPARISON:  Prior head CT from 04/07/2020. FINDINGS: Brain: Moderately advanced  age-related cerebral atrophy, stable from previous. 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 Single punctate subcentimeter focus of FLAIR hyperintensity at the left frontal centrum semi ovale, stable, and of doubtful significance in isolation. No other significant cerebral white matter disease or focal parenchymal signal abnormality. No abnormal foci of restricted diffusion to suggest acute or subacute ischemia. Gray-white matter differentiation maintained. No other areas of encephalomalacia to suggest chronic cortical infarction. No foci of susceptibility artifact to suggest acute or chronic intracranial hemorrhage. No  mass lesion, midline shift or mass effect. No hydrocephalus or extra-axial fluid collection. Pituitary gland suprasellar region normal. Midline structures intact. Vascular: Major intracranial vascular flow voids are maintained. Dominant left vertebral artery noted. Skull and upper cervical spine: Craniocervical junction within normal limits. Heterogeneous marrow signal intensity seen throughout the visualized bone marrow. No focal marrow replacing lesions. No scalp soft tissue abnormality. Sinuses/Orbits: Globes and orbital soft tissues demonstrate no acute finding. Scattered mucosal thickening noted throughout the paranasal sinuses. Superimposed left maxillary sinus retention cyst noted. Bilateral mastoid effusions noted. Inner ear structures normal. Visualized nasopharynx within normal limits. Other: None. IMPRESSION: 1. No acute intracranial abnormality. 2. 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 3. Moderately advanced age-related cerebral atrophy. 4. Bilateral mastoid effusions, of uncertain significance. Correlation with physical exam suggested. Electronically Signed   By: Jeannine Boga M.D.   On: 04/08/2020 19:57        Scheduled Meds: . Chlorhexidine Gluconate Cloth  6 each Topical Q0600  . folic acid  1 mg  Intravenous Daily  . mupirocin ointment  1 application Nasal BID  . thiamine injection  100 mg Intravenous Daily   Continuous Infusions: . sodium chloride Stopped (04/07/20 2235)  . sodium chloride Stopped (04/08/20 1521)  . cefTRIAXone (ROCEPHIN)  IV 1 g (04/08/20 2051)  . dextrose 5% lactated ringers 50 mL/hr at 04/08/20 1444  . octreotide  (SANDOSTATIN)    IV infusion 50 mcg/hr (04/09/20 0333)  . pantoprozole (PROTONIX) infusion 8 mg/hr (04/09/20 0336)     LOS: 2 days    Time spent: 35 minutes.     Elmarie Shiley, MD [facility]   If 7PM-7AM, please contact night-coverage [email or web address]  04/09/2020, 6:41 AM

## 2020-04-09 NOTE — Anesthesia Procedure Notes (Signed)
Procedure Name: MAC Date/Time: 04/09/2020 1:05 PM Performed by: Lowella Dell, CRNA Pre-anesthesia Checklist: Patient identified, Emergency Drugs available, Suction available, Patient being monitored and Timeout performed Patient Re-evaluated:Patient Re-evaluated prior to induction Oxygen Delivery Method: Nasal cannula Placement Confirmation: positive ETCO2 Dental Injury: Teeth and Oropharynx as per pre-operative assessment

## 2020-04-09 NOTE — Anesthesia Preprocedure Evaluation (Signed)
Anesthesia Evaluation  Patient identified by MRN, date of birth, ID band Patient awake    Reviewed: Allergy & Precautions, NPO status , Patient's Chart, lab work & pertinent test results  Airway Mallampati: II  TM Distance: >3 FB     Dental   Pulmonary pneumonia, former smoker,    breath sounds clear to auscultation       Cardiovascular  Rhythm:Regular Rate:Normal  Hx noted CG   Neuro/Psych    GI/Hepatic Neg liver ROS, Hx noted CG   Endo/Other  negative endocrine ROS  Renal/GU negative Renal ROS     Musculoskeletal   Abdominal   Peds  Hematology  (+) anemia ,   Anesthesia Other Findings   Reproductive/Obstetrics                             Anesthesia Physical Anesthesia Plan  ASA: III  Anesthesia Plan: MAC   Post-op Pain Management:    Induction:   PONV Risk Score and Plan: Midazolam and Propofol infusion  Airway Management Planned: Nasal Cannula and Simple Face Mask  Additional Equipment:   Intra-op Plan:   Post-operative Plan:   Informed Consent: I have reviewed the patients History and Physical, chart, labs and discussed the procedure including the risks, benefits and alternatives for the proposed anesthesia with the patient or authorized representative who has indicated his/her understanding and acceptance.     Dental advisory given  Plan Discussed with: CRNA and Anesthesiologist  Anesthesia Plan Comments:         Anesthesia Quick Evaluation

## 2020-04-09 NOTE — Progress Notes (Signed)
PT Cancellation Note  Patient Details Name: Brenda [name] MRN: [id number] DOB: [birth year]   Cancelled Treatment:    Reason Eval/Treat Not Completed: (P) Patient at procedure or test/unavailable Pt is off floor for a procedure. PT will follow back for Evaluation tomorrow.  Ahmaud Duthie B. Migdalia Dk PT, DPT Acute Rehabilitation Services Pager [phone] Office [phone]    [place] 04/09/2020, 12:54 PM

## 2020-04-09 NOTE — Plan of Care (Signed)

## 2020-04-09 NOTE — Progress Notes (Signed)
PT Cancellation Note  Patient Details Name: Brenda [name] MRN: [id number] DOB: Aug 17, 1960   Cancelled Treatment:    Reason Eval/Treat Not Completed: Active bedrest order Pt is on bed rest with bathroom privileges. RBC and platelets remain low. PT will follow back this afternoon to see if appropriate for Turah Migdalia Dk PT, DPT Acute Rehabilitation Services Pager [phone] Office [phone]     [place] 04/09/2020, 9:40 AM

## 2020-04-09 NOTE — Anesthesia Postprocedure Evaluation (Signed)
Anesthesia Post Note  Patient: Brenda [name]  Procedure(s) Performed: ESOPHAGOGASTRODUODENOSCOPY (EGD) WITH PROPOFOL (N/A )     Patient location during evaluation: Endoscopy Anesthesia Type: MAC Level of consciousness: awake Pain management: pain level controlled Vital Signs Assessment: post-procedure vital signs reviewed and stable Respiratory status: spontaneous breathing Cardiovascular status: stable Postop Assessment: no apparent nausea or vomiting Anesthetic complications: no   No complications documented.  Last Vitals:  Vitals:   04/09/20 1401 04/09/20 1504  BP: 136/77 (!) 146/90  Pulse: 86 94  Resp: 20 19  Temp:  36.4 C  SpO2: 100% 98%    Last Pain:  Vitals:   04/09/20 1504  TempSrc: Oral  PainSc:                  Zaylie Gisler

## 2020-04-09 NOTE — Transfer of Care (Signed)
Immediate Anesthesia Transfer of Care Note  Patient: Brenda [name]  Procedure(s) Performed: ESOPHAGOGASTRODUODENOSCOPY (EGD) WITH PROPOFOL (N/A )  Patient Location: PACU and Endoscopy Unit  Anesthesia Type:MAC  Level of Consciousness: drowsy and patient cooperative  Airway & Oxygen Therapy: Patient Spontanous Breathing and Patient connected to face mask oxygen  Post-op Assessment: Report given to RN and Post -op Vital signs reviewed and stable  Post vital signs: Reviewed and stable  Last Vitals:  Vitals Value Taken Time  BP    Temp    Pulse 84 04/09/20 1335  Resp 19 04/09/20 1335  SpO2 100 % 04/09/20 1335  Vitals shown include unvalidated device data.  Last Pain:  Vitals:   04/09/20 1243  TempSrc:   PainSc: 0-No pain         Complications: No complications documented.

## 2020-04-09 NOTE — Progress Notes (Signed)
Brief Oncology Note:  Brenda [name] is well-known to our practice.  We follow her for stage IV (T2a, N2, M1b) non-small cell lung cancer, adenocarcinoma.  The patient is currently receiving maintenance treatment with Alimta and Keytruda every 3 weeks.  Last dose was given on 03/27/2020.  The patient presented to the hospital with confusion.  CBC on admission showed a WBC of 2.0, hemoglobin 4.9, and platelet count of 37,000.  GI was consulted due to concern for GI bleed.  They are planning for an EGD today.  Patient has received 2 units of PRBC so far this admission with improvement of her hemoglobin.  She is also currently receiving 1 unit of platelets in anticipation of her EGD.  CBC with differential today has been reviewed.  The patient is afebrile. ANC is 0.7 today. Discussed with Dr. Julien Nordmann. Will give one dose of Granix 300 mcg sq. The patient has several factors playing into her pancytopenia including recent chemotherapy and bone marrow suppression from alcohol abuse.  Recommend supportive care with PRBC transfusion to keep hemoglobin above 8 and to keep platelets above 20,000.   Mikey Bussing, DNP, AGPCNP-BC, AOCNP

## 2020-04-10 ENCOUNTER — Encounter (HOSPITAL_COMMUNITY): Payer: Self-pay | Admitting: Gastroenterology

## 2020-04-10 DIAGNOSIS — K2961 Other gastritis with bleeding: Secondary | ICD-10-CM

## 2020-04-10 LAB — BPAM PLATELET PHERESIS
Blood Product Expiration Date: 202201182359
ISSUE DATE / TIME: 202201180957
Unit Type and Rh: 5100

## 2020-04-10 LAB — CBC
HCT: 22.7 % — ABNORMAL LOW (ref 36.0–46.0)
Hemoglobin: 7.8 g/dL — ABNORMAL LOW (ref 12.0–15.0)
MCH: 34.7 pg — ABNORMAL HIGH (ref 26.0–34.0)
MCHC: 34.4 g/dL (ref 30.0–36.0)
MCV: 100.9 fL — ABNORMAL HIGH (ref 80.0–100.0)
Platelets: 62 10*3/uL — ABNORMAL LOW (ref 150–400)
RBC: 2.25 MIL/uL — ABNORMAL LOW (ref 3.87–5.11)
RDW: 22.5 % — ABNORMAL HIGH (ref 11.5–15.5)
WBC: 4.6 10*3/uL (ref 4.0–10.5)
nRBC: 0 % (ref 0.0–0.2)

## 2020-04-10 LAB — PREPARE PLATELET PHERESIS: Unit division: 0

## 2020-04-10 LAB — COMPREHENSIVE METABOLIC PANEL
ALT: 76 U/L — ABNORMAL HIGH (ref 0–44)
AST: 78 U/L — ABNORMAL HIGH (ref 15–41)
Albumin: 1.5 g/dL — ABNORMAL LOW (ref 3.5–5.0)
Alkaline Phosphatase: 110 U/L (ref 38–126)
Anion gap: 9 (ref 5–15)
BUN: <5 mg/dL — ABNORMAL LOW (ref 6–20)
CO2: 22 mmol/L (ref 22–32)
Calcium: 7.6 mg/dL — ABNORMAL LOW (ref 8.9–10.3)
Chloride: 108 mmol/L (ref 98–111)
Creatinine, Ser: 1.13 mg/dL — ABNORMAL HIGH (ref 0.44–1.00)
GFR, Estimated: 57 mL/min — ABNORMAL LOW (ref >60)
Glucose, Bld: 104 mg/dL — ABNORMAL HIGH (ref 70–99)
Potassium: 3 mmol/L — ABNORMAL LOW (ref 3.5–5.1)
Sodium: 139 mmol/L (ref 135–145)
Total Bilirubin: 0.7 mg/dL (ref 0.3–1.2)
Total Protein: 4.8 g/dL — ABNORMAL LOW (ref 6.5–8.1)

## 2020-04-10 LAB — GLUCOSE, CAPILLARY
Glucose-Capillary: 111 mg/dL — ABNORMAL HIGH (ref 70–99)
Glucose-Capillary: 135 mg/dL — ABNORMAL HIGH (ref 70–99)
Glucose-Capillary: 94 mg/dL (ref 70–99)
Glucose-Capillary: 120 mg/dL — ABNORMAL HIGH (ref 70–99)
Glucose-Capillary: 98 mg/dL (ref 70–99)
Glucose-Capillary: 106 mg/dL — ABNORMAL HIGH (ref 70–99)

## 2020-04-10 LAB — HAPTOGLOBIN: Haptoglobin: 329 mg/dL (ref 33–346)

## 2020-04-10 MED ORDER — POTASSIUM CHLORIDE CRYS ER 20 MEQ PO TBCR
40.0000 meq | EXTENDED_RELEASE_TABLET | Freq: Once | ORAL | Status: AC
  Administered 2020-04-10: 40 meq via ORAL
  Filled 2020-04-10: qty 2

## 2020-04-10 MED ORDER — PANTOPRAZOLE SODIUM 40 MG PO TBEC
40.0000 mg | DELAYED_RELEASE_TABLET | Freq: Two times a day (BID) | ORAL | Status: DC
  Administered 2020-04-10 – 2020-04-11 (×3): 40 mg via ORAL
  Filled 2020-04-10 (×2): qty 1

## 2020-04-10 NOTE — Plan of Care (Signed)
  Problem: Education: Goal: Knowledge of General Education information will improve Description: Including pain rating scale, medication(s)/side effects and non-pharmacologic comfort measures 04/10/2020 1016 by Don Perking, RN Outcome: Progressing 04/10/2020 1016 by Don Perking, RN Outcome: Progressing   Problem: Health Behavior/Discharge Planning: Goal: Ability to manage health-related needs will improve Outcome: Progressing   Problem: Clinical Measurements: Goal: Ability to maintain clinical measurements within normal limits will improve Outcome: Progressing Goal: Will remain free from infection Outcome: Progressing Goal: Diagnostic test results will improve Outcome: Progressing Goal: Respiratory complications will improve Outcome: Progressing Goal: Cardiovascular complication will be avoided Outcome: Progressing   Problem: Activity: Goal: Risk for activity intolerance will decrease Outcome: Progressing   Problem: Nutrition: Goal: Adequate nutrition will be maintained Outcome: Progressing   Problem: Coping: Goal: Level of anxiety will decrease Outcome: Progressing   Problem: Elimination: Goal: Will not experience complications related to bowel motility Outcome: Progressing Goal: Will not experience complications related to urinary retention Outcome: Progressing   Problem: Pain Managment: Goal: General experience of comfort will improve Outcome: Progressing   Problem: Safety: Goal: Ability to remain free from injury will improve Outcome: Progressing   Problem: Skin Integrity: Goal: Risk for impaired skin integrity will decrease Outcome: Progressing   Problem: Education: Goal: Knowledge of disease or condition will improve Outcome: Progressing Goal: Understanding of discharge needs will improve Outcome: Progressing   Problem: Health Behavior/Discharge Planning: Goal: Ability to identify changes in lifestyle to reduce recurrence of condition  will improve Outcome: Progressing Goal: Identification of resources available to assist in meeting health care needs will improve Outcome: Progressing   Problem: Physical Regulation: Goal: Complications related to the disease process, condition or treatment will be avoided or minimized Outcome: Progressing   Problem: Safety: Goal: Ability to remain free from injury will improve Outcome: Progressing   Problem: Education: Goal: Ability to identify signs and symptoms of gastrointestinal bleeding will improve Outcome: Progressing   Problem: Bowel/Gastric: Goal: Will show no signs and symptoms of gastrointestinal bleeding Outcome: Progressing   Problem: Fluid Volume: Goal: Will show no signs and symptoms of excessive bleeding Outcome: Progressing   Problem: Clinical Measurements: Goal: Complications related to the disease process, condition or treatment will be avoided or minimized Outcome: Progressing

## 2020-04-10 NOTE — Progress Notes (Addendum)
PROGRESS NOTE    Brenda [name]  TFT:[id number] DOB: 1960-06-16 DOA: 04/07/2020 PCP: Pcp, No   Brief Narrative: 60 year old with past medical history significant for stage IV adenocarcinoma of the right lung on chemotherapy, chronic anemia with baseline hemoglobin 8-10, HIV, chronic alcohol abuse, admitted to [facility] on 04/07/2020 with concern of acute upper GI bleed after presented with melena (rectal exam in ED revealed melena) , and evaluation for confusion.  Patient at baseline is alert and oriented x4 without any significant confusion, family noticed that patient has been confused and smoked cocaine on the day of admission. Patient admitted with severe pancytopenia with a hemoglobin of 4.9, platelet of 37, concern for GI bleed, GI was consulted the patient was started on Protonix drip, octreotide tried, IV antibiotics.  Plan for endoscopy 1/18.   Assessment & Plan:   Acute blood loss anemia Upper GI bleed Presented with melena revealed on rectal exam, hemoglobin of 4.9. -Transfused 2 units of PRBC, hemoglobin up to 7.8 today -EGD yesterday with erosive gastropathy and gastritis with hemorrhage -Change PPI to p.o., discontinue octreotide -CBC in a.m. -Ambulate, PT OT  Pancytopenia, -Likely secondary to chemotherapy -Monitor, hemoglobin improved see above  Acute metabolic encephalopathy; -Likely secondary to cocaine use alcohol, etc. -Resolved  Stage IV lung cancer -Followed by Dr. Julien Nordmann at [facility] -Currently on Alimta and Keytruda, last dose 1/5 next dose on 1/25  Hypokalemia;  Replaced.   Chronic alcohol abuse; polysubstance abuse.  -Used cocaine in the admission, admits to using this occasionally and drinks alcohol once or twice per week -No evidence of withdrawal, will discontinue Ativan, continue thiamine -Counseled  Hypoglycemia;  -Resolved, discontinue D5 normal saline  Transaminases; probably secondary to alcohol use.   Estimated body  mass index is 20.6 kg/m as calculated from the following:   Height as of this encounter: 5\' 6"  (1.676 m).   Weight as of this encounter: 57.9 kg.   DVT prophylaxis: SCD Code Status: Full code Family Communication: Discussed with patient in detail, no family at bedside Disposition Plan:  Status is: Inpatient  Remains inpatient appropriate because:Hemodynamically unstable   Dispo: The patient is from: Home              Anticipated d/c is to: to be determine              Anticipated d/c date is: 1 to 2 days              Patient currently is not medically stable to d/c.  Consultants:   GI  [name].    Procedures: EGD 1/18 noted erosive gastropathy and gastritis with hemorrhage, duodenitis  Antimicrobials:  Ceftriaxone 2 gr   Subjective: -Feels okay today, denies any complaints  Objective: Vitals:   04/09/20 2000 04/09/20 2300 04/10/20 0300 04/10/20 0717  BP: 117/75 121/77 116/64 107/63  Pulse: 88 89 90 96  Resp: 12 19 17  (!) 22  Temp: 98 F (36.7 C) 97.8 F (36.6 C) 98 F (36.7 C) 98.2 F (36.8 C)  TempSrc: Oral Oral Oral Oral  SpO2: 95% 94% 96% 95%  Weight:      Height:        Intake/Output Summary (Last 24 hours) at 04/10/2020 1039 Last data filed at 04/10/2020 0900 Gross per 24 hour  Intake 3815.96 ml  Output 600 ml  Net 3215.96 ml   Filed Weights   04/08/20 1900 04/09/20 1243  Weight: 57.9 kg 57.9 kg    Examination:  Gen: Awake,  Alert, Oriented X 3, no distress HEENT: Positive pallor, no icterus Lungs: Clear bilaterally CVS: S1-S2, regular rate rhythm Abd: Soft, nontender, small ventral hernia, bowel sounds present Extremities: No edema Skin: no new rashes  Data Reviewed: I have personally reviewed following labs and imaging studies  CBC: Recent Labs  Lab 04/07/20 1738 04/08/20 1025 04/08/20 1607 04/08/20 2021 04/09/20 0400 04/09/20 1950 04/10/20 0500  WBC 2.0*  --  2.4* 2.3* 2.2* 2.9* 4.6  NEUTROABS 0.6*  --  0.7* 0.7* 0.7*  --    --   HGB 4.9*   < > 10.8* 9.3* 8.3* 8.6* 7.8*  HCT 14.4*   < > 32.9* 27.2* 24.1* 24.9* 22.7*  MCV 114.3*  --  100.9* 100.4* 100.4* 100.4* 100.9*  PLT 37*  --  42* 44* 44* 68* 62*   < > = values in this interval not displayed.   Basic Metabolic Panel: Recent Labs  Lab 04/07/20 1738 04/08/20 0548 04/08/20 1305 04/09/20 0400 04/10/20 0500  NA 135 137 140 137 139  K 3.2* 4.4 4.2 3.6 3.0*  CL 101 105 107 106 108  CO2 22 21* 23 23 22   GLUCOSE 92 63* 108* 128* 104*  BUN 7 5* <5* <5* <5*  CREATININE 1.08* 1.12* 1.14* 1.00 1.13*  CALCIUM 8.0* 7.7* 8.0* 8.0* 7.6*  MG  --  2.1 1.9  --   --   PHOS  --  3.8 3.7  --   --    GFR: Estimated Creatinine Clearance: 50.2 mL/min (A) (by C-G formula based on SCr of 1.13 mg/dL (H)). Liver Function Tests: Recent Labs  Lab 04/07/20 1738 04/08/20 0548 04/08/20 1305 04/09/20 0400 04/10/20 0500  AST 123* 162* 224* 144* 78*  ALT 84* 97* 128* 108* 76*  ALKPHOS 138* 119 134* 114 110  BILITOT 0.6 0.6 1.5* 1.0 0.7  PROT 6.0* 5.5* 6.2* 5.6* 4.8*  ALBUMIN 2.0* 1.8* 2.0* 1.7* 1.5*   No results for input(s): LIPASE, AMYLASE in the last 168 hours. Recent Labs  Lab 04/08/20 0520  AMMONIA 26   Coagulation Profile: Recent Labs  Lab 04/08/20 0548 04/08/20 1305  INR 1.1 1.1   Cardiac Enzymes: No results for input(s): CKTOTAL, CKMB, CKMBINDEX, TROPONINI in the last 168 hours. BNP (last 3 results) No results for input(s): PROBNP in the last 8760 hours. HbA1C: No results for input(s): HGBA1C in the last 72 hours. CBG: Recent Labs  Lab 04/09/20 1502 04/09/20 1937 04/09/20 2354 04/10/20 0413 04/10/20 0811  GLUCAP 109* 117* 120* 111* 135*   Lipid Profile: No results for input(s): CHOL, HDL, LDLCALC, TRIG, CHOLHDL, LDLDIRECT in the last 72 hours. Thyroid Function Tests: No results for input(s): TSH, T4TOTAL, FREET4, T3FREE, THYROIDAB in the last 72 hours. Anemia Panel: Recent Labs    04/08/20 1305  VITAMINB12 1,959*  FOLATE 29.9    Sepsis Labs: No results for input(s): PROCALCITON, LATICACIDVEN in the last 168 hours.  Recent Results (from the past 240 hour(s))  SARS CORONAVIRUS 2 (TAT 6-24 HRS) Nasopharyngeal Nasopharyngeal Swab     Status: None   Collection Time: 04/07/20  9:14 PM   Specimen: Nasopharyngeal Swab  Result Value Ref Range Status   SARS Coronavirus 2 NEGATIVE NEGATIVE Final    Comment: (NOTE) SARS-CoV-2 target nucleic acids are NOT DETECTED.  The SARS-CoV-2 RNA is generally detectable in upper and lower respiratory specimens during the acute phase of infection. Negative results do not preclude SARS-CoV-2 infection, do not rule out co-infections with other pathogens, and should not be  used as the sole basis for treatment or other patient management decisions. Negative results must be combined with clinical observations, patient history, and epidemiological information. The expected result is Negative.  Fact Sheet for Patients: SugarRoll.be  Fact Sheet for Healthcare Providers: https://www.woods-mathews.com/  This test is not yet approved or cleared by the [place] FDA and  has been authorized for detection and/or diagnosis of SARS-CoV-2 by FDA under an Emergency Use Authorization (EUA). This EUA will remain  in effect (meaning this test can be used) for the duration of the COVID-19 declaration under Se ction 564(b)(1) of the Act, 21 U.S.C. section 360bbb-3(b)(1), unless the authorization is terminated or revoked sooner.  Performed at [facility], [place] [address] [place], [place] [address]   MRSA PCR Screening     Status: Abnormal   Collection Time: 04/08/20 12:28 PM   Specimen: Nasal Mucosa; Nasopharyngeal  Result Value Ref Range Status   MRSA by PCR POSITIVE (A) NEGATIVE Final    Comment:        The GeneXpert MRSA Assay (FDA approved for NASAL specimens only), is one component of a comprehensive MRSA colonization surveillance  program. It is not intended to diagnose MRSA infection nor to guide or monitor treatment for MRSA infections. RESULT CALLED TO, READ BACK BY AND VERIFIED WITH: Laurette Schimke RN 15:20 04/08/20 ([email or web address]) Performed at [facility], [place] [address], [place], [place] [address]          Radiology Studies: MR BRAIN WO CONTRAST  Result Date: 04/08/2020 CLINICAL DATA:  Initial evaluation for delirium, dizziness. EXAM: MRI HEAD WITHOUT CONTRAST TECHNIQUE: Multiplanar, multiecho pulse sequences of the brain and surrounding structures were obtained without intravenous contrast. COMPARISON:  Prior head CT from 04/07/2020. FINDINGS: Brain: Moderately advanced age-related cerebral atrophy, stable from previous. 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 Single punctate subcentimeter focus of FLAIR hyperintensity at the left frontal centrum semi ovale, stable, and of doubtful significance in isolation. No other significant cerebral white matter disease or focal parenchymal signal abnormality. No abnormal foci of restricted diffusion to suggest acute or subacute ischemia. Gray-white matter differentiation maintained. No other areas of encephalomalacia to suggest chronic cortical infarction. No foci of susceptibility artifact to suggest acute or chronic intracranial hemorrhage. No mass lesion, midline shift or mass effect. No hydrocephalus or extra-axial fluid collection. Pituitary gland suprasellar region normal. Midline structures intact. Vascular: Major intracranial vascular flow voids are maintained. Dominant left vertebral artery noted. Skull and upper cervical spine: Craniocervical junction within normal limits. Heterogeneous marrow signal intensity seen throughout the visualized bone marrow. No focal marrow replacing lesions. No scalp soft tissue abnormality. Sinuses/Orbits: Globes and orbital soft tissues demonstrate no acute finding. Scattered mucosal thickening noted throughout  the paranasal sinuses. Superimposed left maxillary sinus retention cyst noted. Bilateral mastoid effusions noted. Inner ear structures normal. Visualized nasopharynx within normal limits. Other: None. IMPRESSION: 1. No acute intracranial abnormality. 2. 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 3. Moderately advanced age-related cerebral atrophy. 4. Bilateral mastoid effusions, of uncertain significance. Correlation with physical exam suggested. Electronically Signed   By: Jeannine Boga M.D.   On: 04/08/2020 19:57        Scheduled Meds: . Chlorhexidine Gluconate Cloth  6 each Topical Q0600  . folic acid  1 mg Intravenous Daily  . mupirocin ointment  1 application Nasal BID  . pantoprazole  40 mg Oral BID  . thiamine injection  100 mg Intravenous  Daily   Continuous Infusions: . sodium chloride Stopped (04/07/20 2235)  . cefTRIAXone (ROCEPHIN)  IV Stopped (04/09/20 2126)     LOS: 3 days    Time spent: 35 minutes.   Domenic Polite, MD [facility]  04/10/2020, 10:39 AM

## 2020-04-10 NOTE — Plan of Care (Signed)
  Problem: Education: Goal: Knowledge of General Education information will improve Description: Including pain rating scale, medication(s)/side effects and non-pharmacologic comfort measures Outcome: Progressing   Problem: Health Behavior/Discharge Planning: Goal: Ability to manage health-related needs will improve Outcome: Progressing   Problem: Clinical Measurements: Goal: Ability to maintain clinical measurements within normal limits will improve Outcome: Progressing Goal: Will remain free from infection Outcome: Progressing Goal: Diagnostic test results will improve Outcome: Progressing Goal: Respiratory complications will improve Outcome: Progressing Goal: Cardiovascular complication will be avoided Outcome: Progressing   Problem: Activity: Goal: Risk for activity intolerance will decrease Outcome: Progressing   Problem: Nutrition: Goal: Adequate nutrition will be maintained Outcome: Progressing   Problem: Coping: Goal: Level of anxiety will decrease Outcome: Progressing   Problem: Elimination: Goal: Will not experience complications related to bowel motility Outcome: Progressing Goal: Will not experience complications related to urinary retention Outcome: Progressing   Problem: Pain Managment: Goal: General experience of comfort will improve Outcome: Progressing   Problem: Safety: Goal: Ability to remain free from injury will improve Outcome: Progressing   Problem: Skin Integrity: Goal: Risk for impaired skin integrity will decrease Outcome: Progressing   Problem: Education: Goal: Knowledge of disease or condition will improve Outcome: Progressing Goal: Understanding of discharge needs will improve Outcome: Progressing   Problem: Health Behavior/Discharge Planning: Goal: Ability to identify changes in lifestyle to reduce recurrence of condition will improve Outcome: Progressing Goal: Identification of resources available to assist in meeting health  care needs will improve Outcome: Progressing   Problem: Physical Regulation: Goal: Complications related to the disease process, condition or treatment will be avoided or minimized Outcome: Progressing   Problem: Safety: Goal: Ability to remain free from injury will improve Outcome: Progressing   Problem: Education: Goal: Ability to identify signs and symptoms of gastrointestinal bleeding will improve Outcome: Progressing   Problem: Fluid Volume: Goal: Will show no signs and symptoms of excessive bleeding Outcome: Progressing   Problem: Bowel/Gastric: Goal: Will show no signs and symptoms of gastrointestinal bleeding Outcome: Progressing   Problem: Education: Goal: Ability to identify signs and symptoms of gastrointestinal bleeding will improve Outcome: Progressing   Problem: Safety: Goal: Ability to remain free from injury will improve Outcome: Progressing

## 2020-04-10 NOTE — Progress Notes (Signed)
[facility] Progress Note  Chief Complaint: Acute blood loss anemia  History:  Brenda [name] has been stable since her upper endoscopy with me yesterday.  Mental status has improved back to what is believed to be her baseline according to nursing conversations with her family.  She is aware of the month and year and approximate length of stay.  She recalls seeing me yesterday for an upper endoscopy.  She is tolerating regular diet without abdominal pain nausea or vomiting.  Brenda [name] had a bowel movement shortly before my arrival, but she flushed it before nursing could see it.  Patient says that it was back to "normal".  Residual stool debris in the toilet bowl is light brown.  ROS: Cardiovascular: No chest pain Respiratory: No dyspnea Urinary: No dysuria  Objective:   Current Facility-Administered Medications:  .  0.9 %  sodium chloride infusion, 10 mL/hr, Intravenous, Once, Doran Stabler, MD, Held at 04/07/20 2235 .  cefTRIAXone (ROCEPHIN) 1 g in sodium chloride 0.9 % 100 mL IVPB, 1 g, Intravenous, Q24H, Howerter, Justin B, DO, Stopped at 04/09/20 2126 .  Chlorhexidine Gluconate Cloth 2 % PADS 6 each, 6 each, Topical, Q0600, [name], MD, 6 each at 04/10/20 0442 .  folic acid injection 1 mg, 1 mg, Intravenous, Daily, Danis, Estill Cotta III, MD, 1 mg at 04/09/20 1119 .  mupirocin ointment (BACTROBAN) 2 % 1 application, 1 application, Nasal, BID, Danis, Estill Cotta III, MD, 1 application at 16/10/96 0954 .  pantoprazole (PROTONIX) EC tablet 40 mg, 40 mg, Oral, BID, Domenic Polite, MD .  potassium chloride SA (KLOR-CON) CR tablet 40 mEq, 40 mEq, Oral, Once, Domenic Polite, MD .  thiamine (B-1) injection 100 mg, 100 mg, Intravenous, Daily, [name], MD, 100 mg at 04/10/20 0952  . sodium chloride Stopped (04/07/20 2235)  . cefTRIAXone (ROCEPHIN)  IV Stopped (04/09/20 2126)     Vital signs in last 24 hrs: Vitals:   04/10/20 0300 04/10/20 0717  BP: 116/64 107/63  Pulse: 90 96   Resp: 17 (!) 22  Temp: 98 F (36.7 C) 98.2 F (36.8 C)  SpO2: 96% 95%    Intake/Output Summary (Last 24 hours) at 04/10/2020 1154 Last data filed at 04/10/2020 0900 Gross per 24 hour  Intake 3815.96 ml  Output 600 ml  Net 3215.96 ml     Physical Exam Chronically ill-appearing.  Alert, conversational, comfortable sitting in chair at bedside having lunch  Neck: supple, no thyromegaly, JVD or lymphadenopathy  Cardiac: RRR without murmurs, S1S2 heard, no peripheral edema  Pulm: clear to auscultation bilaterally, normal RR and effort noted  Abdomen: soft, no tenderness, with active bowel sounds. No guarding or palpable hepatosplenomegaly  Skin; warm and dry, no jaundice  Recent Labs:  CBC Latest Ref Rng & Units 04/10/2020 04/09/2020 04/09/2020  WBC 4.0 - 10.5 K/uL 4.6 2.9(L) 2.2(L)  Hemoglobin 12.0 - 15.0 g/dL 7.8(L) 8.6(L) 8.3(L)  Hematocrit 36.0 - 46.0 % 22.7(L) 24.9(L) 24.1(L)  Platelets 150 - 400 K/uL 62(L) 68(L) 44(L)    Recent Labs  Lab 04/08/20 1305  INR 1.1   CMP Latest Ref Rng & Units 04/10/2020 04/09/2020 04/08/2020  Glucose 70 - 99 mg/dL 104(H) 128(H) 108(H)  BUN 6 - 20 mg/dL <5(L) <5(L) <5(L)  Creatinine 0.44 - 1.00 mg/dL 1.13(H) 1.00 1.14(H)  Sodium 135 - 145 mmol/L 139 137 140  Potassium 3.5 - 5.1 mmol/L 3.0(L) 3.6 4.2  Chloride 98 - 111 mmol/L 108 106 107  CO2  22 - 32 mmol/L 22 23 23   Calcium 8.9 - 10.3 mg/dL 7.6(L) 8.0(L) 8.0(L)  Total Protein 6.5 - 8.1 g/dL 4.8(L) 5.6(L) 6.2(L)  Total Bilirubin 0.3 - 1.2 mg/dL 0.7 1.0 1.5(H)  Alkaline Phos 38 - 126 U/L 110 114 134(H)  AST 15 - 41 U/L 78(H) 144(H) 224(H)  ALT 0 - 44 U/L 76(H) 108(H) 128(H)     Radiologic studies:   Assessment & Plan  Assessment:  Melena Acute blood loss anemia on top of chronic multifactorial anemia (alcohol and chemotherapy-induced marrow suppression with underlying lung cancer)  Hemorrhagic gastritis seen on endoscopy yesterday.  Believe to be multifactorial (alcohol,  question low-grade ischemia from cocaine use, possible H. pylori with serum antibody pending).  Conversation with patient's niece yesterday indicates that Brenda [name] has not been previously tested or treated for H. pylori to their knowledge.  Bleeding was exacerbated by thrombocytopenia along with pancytopenia from most recent chemotherapy and perhaps also some consumptive effect of bleeding.  Platelet transfusion given, platelets stable over 50,000 since then.  Hemoglobin decreased today, though not clear if there has been overt ongoing bleeding.  I believe the source of bleeding was found on the endoscopy yesterday, but we must still consider a concomitant lower GI source of bleeding.  Plan: Hemoglobin and hematocrit this evening.  Transfuse as needed if hemoglobin below 7. CBC in AM. I asked the nurse to place a toilet hat so further BMs can be examined to see if there is overt bleeding. If there is concern for ongoing GI bleeding tomorrow, she would need a colonoscopy prior to discharge.  Following up H. pylori antibody when resulted.  [name] Office: [phone]

## 2020-04-10 NOTE — Progress Notes (Signed)
..  The following Assist/Replace Program for Alimta from [facility] has been terminated due to Medicaid starting 03/23/2020.  Last DOS: 03/27/2020

## 2020-04-10 NOTE — Progress Notes (Signed)
..  The following Assist/Replace Program for [facility] from [place] has been terminated due to Medicaid coverage starting 03/23/2020.  Last DOS: 03/27/2020

## 2020-04-10 NOTE — Progress Notes (Signed)
Occupational Therapy Evaluation Patient Details Name: Brenda [name] MRN: [id number] DOB: Jul 29, 1960 Today's Date: 04/10/2020    History of Present Illness 60 y.o. 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 Pt found to have Hgb of 4.9 and was given 3 units PRBC s/p 04/09/20 EGD where she ws found to have erosive gastropathy with no bleeding, gastritis with hemorrhage, and deudenitits.   Clinical Impression   PTA pt lives in a hotel with her "associate" and is independent with ADL and mobility. Pt able to ambulate to the bathroom with minguard A. BP supine 90/64; sitting after activity 116/75. Pt complaining of feeling "dizzy"; Max HR 130; SpO2 100 on RA. Encouraged pt to walk to the bathroom with staff instead of using a [place] as pt will be discharging back to her hotel room at the "[place]". Pt states her niece will most likely be able to assist at discharge.  Recommend pt have direct S with all medication management.  Will follow acutely and do not anticipate need for OT follow up.     Follow Up Recommendations  Supervision - Intermittent    Equipment Recommendations  Tub/shower seat    Recommendations for Other Services       Precautions / Restrictions Precautions Precautions: Fall Restrictions Weight Bearing Restrictions: No      Mobility Bed Mobility Overal bed mobility: Modified Independent                  Transfers Overall transfer level: Needs assistance Equipment used: 1 person hand held assist Transfers: Sit to/from Stand Sit to Stand: Min guard              Balance Overall balance assessment: Needs assistance   Sitting balance-Leahy Scale: Good        Standing balance-Leahy Scale: Fair                             ADL either performed or assessed with clinical judgement   ADL Overall ADL's : Needs assistance/impaired                                     Functional mobility during ADLs: Min guard General ADL Comments: Pt overall set up/for UB ADL adn mingaurd A for LB ADL @ sit - stand level.     Vision         Perception     Praxis      Pertinent Vitals/Pain Pain Assessment: 0-10 Pain Score: 5  Pain Location: headache Pain Descriptors / Indicators: Headache Pain Intervention(s): Limited activity within patient's tolerance     Hand Dominance Right   Extremity/Trunk Assessment Upper Extremity Assessment Upper Extremity Assessment: Generalized weakness   Lower Extremity Assessment Lower Extremity Assessment: Defer to PT evaluation   Cervical / Trunk Assessment Cervical / Trunk Assessment: Normal   Communication Communication Communication: No difficulties   Cognition Arousal/Alertness: Awake/alert Behavior During Therapy: WFL for tasks assessed/performed Overall Cognitive Status: No family/caregiver present to determine baseline cognitive functioning Area of Impairment: Attention;Memory;Safety/judgement;Awareness                 Orientation  Level: Disoriented to;Time (wrong date) Current Attention Level: Selective Memory: Decreased short-term memory   Safety/Judgement: Decreased awareness of safety;Decreased awareness of deficits Awareness: Emergent Problem Solving: Slow processing General Comments: Most likely getting closer to baseline; impaired judgement at baseline   General Comments       Exercises     Shoulder Instructions      Home Living Family/patient expects to be discharged to:: Other (Comment) Living Arrangements: Spouse/significant other   Type of Home: Other(Comment) Home Access: Stairs to enter Entrance Stairs-Number of Steps: 14 Entrance  Stairs-Rails: Right;Left;Can reach both Home Layout: One level     Bathroom Shower/Tub: Teacher, early years/pre: Handicapped height     Home Equipment: None   Additional Comments: lives at hotel "[place]" - lives on 2nd floor; was planning to go into a house but said her son spent the money on "some crack"      Prior Functioning/Environment Level of Independence: Independent                 OT Problem List: Decreased strength;Decreased activity tolerance;Impaired balance (sitting and/or standing);Decreased cognition;Decreased safety awareness;Decreased knowledge of use of DME or AE;Cardiopulmonary status limiting activity;Pain      OT Treatment/Interventions: Self-care/ADL training;Therapeutic exercise;Energy conservation;DME and/or AE instruction;Therapeutic activities;Cognitive remediation/compensation;Patient/family education;Balance training    OT Goals(Current goals can be found in the care plan section) Acute Rehab OT Goals Patient Stated Goal: to get better OT Goal Formulation: With patient Time For Goal Achievement: 04/24/20 Potential to Achieve Goals: Good  OT Frequency: Min 2X/week   Barriers to D/C: Decreased caregiver support  Lives in a hotel       Co-evaluation              AM-PAC OT "6 Clicks" Daily Activity     Outcome Measure Help from another person eating meals?: None Help from another person taking care of personal grooming?: A Little Help from another person toileting, which includes using toliet, bedpan, or urinal?: A Little Help from another person bathing (including washing, rinsing, drying)?: A Little Help from another person to put on and taking off regular upper body clothing?: A Little Help from another person to put on and taking off regular lower body clothing?: A Little 6 Click Score: 19   End of Session Equipment Utilized During Treatment: Gait belt Nurse Communication: Mobility status;Other (comment) (encourage  mobility)  Activity Tolerance: Patient tolerated treatment well Patient left: in chair;with call bell/phone within reach;with chair alarm set  OT Visit Diagnosis: Unsteadiness on feet (R26.81);Other abnormalities of gait and mobility (R26.89);Muscle weakness (generalized) (M62.81);Other symptoms and signs involving cognitive function;Pain Pain - part of body:  (Headache)                Time: 6659-9357 OT Time Calculation (min): 40 min Charges:  OT General Charges $OT Visit: 1 Visit OT Evaluation $OT Eval Moderate Complexity: 1 Mod OT Treatments $Self Care/Home Management : 23-37 mins  Maurie Boettcher, OT/L   Acute OT Clinical Specialist [place] Pager [phone] Office [phone]   [facility] 04/10/2020, 12:00 PM

## 2020-04-10 NOTE — Progress Notes (Signed)
..  The following Assist/Replace Program for [place] from [place] has been terminated due to Medicaid starting 03/23/2020.  Last DOS: 11/28/2019

## 2020-04-11 ENCOUNTER — Other Ambulatory Visit (HOSPITAL_COMMUNITY): Payer: Self-pay | Admitting: Internal Medicine

## 2020-04-11 LAB — COMPREHENSIVE METABOLIC PANEL
ALT: 71 U/L — ABNORMAL HIGH (ref 0–44)
AST: 70 U/L — ABNORMAL HIGH (ref 15–41)
Albumin: 1.6 g/dL — ABNORMAL LOW (ref 3.5–5.0)
Alkaline Phosphatase: 146 U/L — ABNORMAL HIGH (ref 38–126)
Anion gap: 11 (ref 5–15)
BUN: <5 mg/dL — ABNORMAL LOW (ref 6–20)
CO2: 22 mmol/L (ref 22–32)
Calcium: 7.6 mg/dL — ABNORMAL LOW (ref 8.9–10.3)
Chloride: 108 mmol/L (ref 98–111)
Creatinine, Ser: 1.36 mg/dL — ABNORMAL HIGH (ref 0.44–1.00)
GFR, Estimated: 45 mL/min — ABNORMAL LOW (ref >60)
Glucose, Bld: 98 mg/dL (ref 70–99)
Potassium: 3.3 mmol/L — ABNORMAL LOW (ref 3.5–5.1)
Sodium: 141 mmol/L (ref 135–145)
Total Bilirubin: 0.7 mg/dL (ref 0.3–1.2)
Total Protein: 5.1 g/dL — ABNORMAL LOW (ref 6.5–8.1)

## 2020-04-11 LAB — CBC
HCT: 24.4 % — ABNORMAL LOW (ref 36.0–46.0)
Hemoglobin: 7.9 g/dL — ABNORMAL LOW (ref 12.0–15.0)
MCH: 33.5 pg (ref 26.0–34.0)
MCHC: 32.4 g/dL (ref 30.0–36.0)
MCV: 103.4 fL — ABNORMAL HIGH (ref 80.0–100.0)
Platelets: 97 10*3/uL — ABNORMAL LOW (ref 150–400)
RBC: 2.36 MIL/uL — ABNORMAL LOW (ref 3.87–5.11)
RDW: 22.6 % — ABNORMAL HIGH (ref 11.5–15.5)
WBC: 7.6 10*3/uL (ref 4.0–10.5)
nRBC: 0 % (ref 0.0–0.2)

## 2020-04-11 LAB — H. PYLORI ANTIBODY, IGG: H Pylori IgG: 0.32 Index Value (ref 0.00–0.79)

## 2020-04-11 LAB — GLUCOSE, CAPILLARY
Glucose-Capillary: 101 mg/dL — ABNORMAL HIGH (ref 70–99)
Glucose-Capillary: 130 mg/dL — ABNORMAL HIGH (ref 70–99)
Glucose-Capillary: 87 mg/dL (ref 70–99)

## 2020-04-11 MED ORDER — FOLIC ACID 1 MG PO TABS: 1.0000 mg | ORAL_TABLET | Freq: Every day | ORAL | 2 refills | Status: DC

## 2020-04-11 MED ORDER — ACETAMINOPHEN 325 MG PO TABS: 650.0000 mg | ORAL_TABLET | Freq: Four times a day (QID) | ORAL | Status: DC | PRN

## 2020-04-11 MED ORDER — POTASSIUM CHLORIDE CRYS ER 20 MEQ PO TBCR
40.0000 meq | EXTENDED_RELEASE_TABLET | Freq: Once | ORAL | Status: AC
  Administered 2020-04-11: 40 meq via ORAL
  Filled 2020-04-11: qty 2

## 2020-04-11 MED ORDER — HEPARIN SOD (PORK) LOCK FLUSH 100 UNIT/ML IV SOLN
500.0000 [IU] | INTRAVENOUS | Status: AC | PRN
  Administered 2020-04-11: 500 [IU]
  Filled 2020-04-11: qty 5

## 2020-04-11 MED ORDER — FERROUS SULFATE 325 (65 FE) MG PO TABS: 325.0000 mg | ORAL_TABLET | Freq: Two times a day (BID) | ORAL | 1 refills | Status: DC

## 2020-04-11 MED ORDER — PANTOPRAZOLE SODIUM 40 MG PO TBEC: 40.0000 mg | DELAYED_RELEASE_TABLET | Freq: Two times a day (BID) | ORAL | 0 refills | Status: DC

## 2020-04-11 MED ORDER — SODIUM CHLORIDE 0.9 % IV SOLN
510.0000 mg | Freq: Once | INTRAVENOUS | Status: AC
  Administered 2020-04-11: 510 mg via INTRAVENOUS
  Filled 2020-04-11: qty 17

## 2020-04-11 NOTE — TOC Initial Note (Signed)
Transition of Care [facility]) - Initial/Assessment Note    Patient Details  Name: Brenda [name] MRN: [id number] Date of Birth: [birth year]  Transition of Care [facility]) CM/SW Contact:    Zenon Mayo, RN Phone Number: 04/11/2020, 12:19 PM  Clinical Narrative:                 NCM spoke with patient at bedside, she is for dc today, she states she lives in hotel with her boyfriend, he went to get gas and will be back.  He will transport her at dc.  Secretary is making follow up apt for her at the [facility] clinic.  NCM asked if could set up HHPT for patient, she states she does not want HHPT. But she would like to have the rolling walker, she is ok for [employer] to supply walker.  NCM made referral to [facility] with [employer], the walker will be brought up to room prior to dc. Patient states she goes to [facility] outpatient pharmacy to get her meds.   Expected Discharge Plan: [place] Barriers to Discharge: No Barriers Identified   Patient Goals and CMS Choice Patient states their goals for this hospitalization and ongoing recovery are:: to get better      Expected Discharge Plan and Services Expected Discharge Plan: [place] In-house Referral: NA Discharge Planning Services: CM Consult Post Acute Care Choice: Durable Medical Equipment (walker) Living arrangements for the past 2 months: Hotel/Motel Expected Discharge Date: 04/11/20               DME Arranged: Gilford Rile rolling DME Agency: [employer] Date DME Agency Contacted: 04/11/20 Time DME Agency Contacted: 1217 Representative spoke with at DME Agency: [place]: Refused HH          Prior Living Arrangements/Services Living arrangements for the past 2 months: Hotel/Motel Lives with:: Significant Other Patient language and need for interpreter reviewed:: Yes Do you feel safe going back to the place where you live?: Yes      Need for Family Participation in Patient Care: Yes (Comment) Care giver  support system in place?: Yes (comment)   Criminal Activity/Legal Involvement Pertinent to Current Situation/Hospitalization: No - Comment as needed  Activities of Daily Living Home Assistive Devices/Equipment: None ADL Screening (condition at time of admission) Patient's cognitive ability adequate to safely complete daily activities?: Yes Is the patient deaf or have difficulty hearing?: No Does the patient have difficulty seeing, even when wearing glasses/contacts?: No Does the patient have difficulty concentrating, remembering, or making decisions?: No Patient able to express need for assistance with ADLs?: No Does the patient have difficulty dressing or bathing?: No Independently performs ADLs?: Yes (appropriate for developmental age) Does the patient have difficulty walking or climbing stairs?: No Weakness of Legs: Both Weakness of Arms/Hands: Both  Permission Sought/Granted                  Emotional Assessment Appearance:: Appears stated age Attitude/Demeanor/Rapport: Other (comment) (c/o pain) Affect (typically observed): Appropriate Orientation: : Oriented to Self,Oriented to Place,Oriented to  Time,Oriented to Situation Alcohol / Substance Use: Not Applicable Psych Involvement: No (comment)  Admission diagnosis:  Gastrointestinal hemorrhage, unspecified gastrointestinal hemorrhage type [K92.2] Anemia, unspecified type [D64.9] Acute on chronic anemia [D64.9] Patient Active Problem List   Diagnosis Date Noted  . Acute on chronic anemia 04/07/2020  . Acute upper GI bleed 04/07/2020  . Acute encephalopathy 04/07/2020  . Pancytopenia ([place]) 04/07/2020  . Polysubstance abuse ([place]) 04/07/2020  .  Hypokalemia 10/19/2019  . Neutropenia ([place]) 10/05/2019  . Adenocarcinoma of right lung, stage 4 ([place]) 09/14/2019  . Encounter for antineoplastic chemotherapy 09/14/2019  . Encounter for antineoplastic immunotherapy 09/14/2019  . Goals of care, counseling/discussion 09/14/2019   . Tobacco abuse counseling 09/14/2019  . 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 ([place])   . Alcohol abuse   . Acute respiratory failure ([place]) 04/03/2019  . Lung mass   . Multifocal pneumonia   . Major depressive disorder   . Major depressive disorder, recurrent episode with mood-congruent psychotic features ([place]) 05/30/2017  . 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 [address] [place] [address] Phone: [phone] Fax: [phone]     Social Determinants of Health (SDOH) Interventions    Readmission Risk Interventions Readmission Risk Prevention Plan 04/11/2020  Transportation Screening Complete  PCP or Specialist Appt within 3-5 Days Complete  HRI or [place] Complete  Social Work Consult for [place] Planning/Counseling Complete  Palliative Care Screening Not Applicable  Medication Review Press photographer) Complete  Some recent data might be hidden

## 2020-04-11 NOTE — Discharge Summary (Signed)
Physician Discharge Summary  Brenda [name] DJM:[id number] DOB: February 16, 1961 DOA: 04/07/2020  PCP: Pcp, No  Admit date: 04/07/2020 Discharge date: 04/11/2020  Time spent: 35 minutes  Recommendations for Outpatient Follow-up:  1. Oncology Dr. Julien Nordmann in 1 week, please check CBC and follow-up 2. Please follow-up with H. pylori studies   Discharge Diagnoses:  Principal Problem:   Acute upper GI bleed Erosive gastropathy Gastritis with hemorrhage Pancytopenia Stage IV non-small cell lung cancer on chemotherapy Cocaine abuse Alcohol use disorder Hypoglycemia Acute toxic encephalopathy   Hypokalemia   Acute on chronic anemia   Acute encephalopathy   Pancytopenia (HCC)   Polysubstance abuse ([place])   Discharge Condition: Improved  Diet recommendation: Regular  Filed Weights   04/08/20 1900 04/09/20 1243  Weight: 57.9 kg 57.9 kg    History of present illness:  60 year old with past medical history significant for 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 -Patient at baseline is alert and oriented x4 without any significant confusion, family noticed that patient has been confused and smoked cocaine on the day of admission. Patient admitted with severe pancytopenia with a hemoglobin of 4.9, platelet of 37, concern for GI bleed,  Hospital Course:   Acute blood loss anemia Upper GI bleed -Was brought to the emergency room weak and confused, labs revealed a hemoglobin of 4.9, on rectal exam she was noted to have melena -Transfused 2 units of PRBC, hemoglobin up to 7.9 now -Gastroenterology consulted, underwent EGD which revealed erosive gastropathy and gastritis with hemorrhage -Continue PPI 40 mg twice daily at discharge, given  IV iron this morning and advised to continue oral iron at discharge -Follow-up at the cancer center next week, needs CBC check and followup  Pancytopenia, -Likely secondary to chemotherapy, she is on Alimta and Keytruda every 3 weeks -Hemoglobin stable at discharge, to be monitored at FU  Acute metabolic encephalopathy; -Likely secondary to cocaine use alcohol, etc. -Resolved  Stage IV lung cancer -Followed by Dr. Julien Nordmann at [place] [facility] center -Currently on Alimta and Keytruda, last dose 1/5 next dose on 1/25  Hypokalemia;  Replaced.   Chronic alcohol abuse; polysubstance abuse.  -Used cocaine in the admission, admits to using this occasionally and drinks alcohol once or twice per week -No evidence of withdrawal -Counseled  Hypoglycemia;  -Resolved  Transaminases; probably secondary to alcohol use.   Estimated body mass index is 20.6 kg/m as calculated from the following:   Height as of this encounter: 5\' 6"  (1.676 m).   Weight as of this encounter: 57.9 kg.    Discharge Exam: Vitals:   04/11/20 0800 04/11/20 1231  BP: 119/70 130/75  Pulse: 99 91  Resp: (!) 23 19  Temp: 98.1 F (36.7 C) (!) 97.5 F (36.4 C)  SpO2: 95% 97%    General: Awake alert oriented x3, no acute Cardiovascular: S1-S2, regular rate rhythm Respiratory: Clear  Discharge Instructions   Discharge Instructions    Diet general   Complete by: As directed    Increase activity slowly   Complete by: As directed      Allergies as of 04/11/2020      Reactions   Aspirin Adult Low [aspirin] Other (See Comments)   Stomach upset      Medication List    STOP  taking these medications   prochlorperazine 10 MG tablet Commonly known as: COMPAZINE     TAKE these medications   acetaminophen 325 MG tablet Commonly known as: TYLENOL Take 2 tablets (650 mg total) by mouth every 6 (six) hours as needed for mild pain, moderate pain or headache.   calcium carbonate 1500 (600 Ca)  MG Tabs tablet Commonly known as: OSCAL Take 600 mg by mouth daily.   ferrous sulfate 325 (65 FE) MG tablet Commonly known as: FerrouSul Take 1 tablet (325 mg total) by mouth 2 (two) times daily with a meal.   folic acid 1 MG tablet Commonly known as: FOLVITE Take 1 tablet (1 mg total) by mouth daily.   lidocaine-prilocaine cream Commonly known as: EMLA Apply 1 application topically as needed.   pantoprazole 40 MG tablet Commonly known as: PROTONIX Take 1 tablet (40 mg total) by mouth 2 (two) times daily.   potassium chloride SA 20 MEQ tablet Commonly known as: KLOR-CON Take 1 tablet (20 mEq total) by mouth daily.            Durable Medical Equipment  (From admission, onward)         Start     Ordered   04/11/20 1208  For home use only DME Walker rolling  Once       Question Answer Comment  Walker: With 5 Inch Wheels   Patient needs a walker to treat with the following condition Weakness      04/11/20 1207         Allergies  Allergen Reactions  . Aspirin Adult Low [Aspirin] Other (See Comments)    Stomach upset    Follow-up Information    PCP. Schedule an appointment as soon as possible for a visit in 1 week(s).        [facility] Follow up.   Why: walker Contact information: [place] [address] [phone]                The results of significant diagnostics from this hospitalization (including imaging, microbiology, ancillary and laboratory) are listed below for reference.    Significant Diagnostic Studies: CT Head Wo Contrast  Result Date: 04/07/2020 CLINICAL DATA:  Confusion x2 days.  History of stage IV lung cancer. EXAM: CT HEAD WITHOUT CONTRAST TECHNIQUE: Contiguous axial images were obtained from the base of the skull through the vertex without intravenous contrast. COMPARISON:  September 11, 2019 FINDINGS: Brain: There is mild cerebral atrophy with widening of the extra-axial spaces and ventricular  dilatation. There are areas of decreased attenuation within the white matter tracts of the supratentorial brain, consistent with microvascular disease changes. Vascular: No hyperdense vessel or unexpected calcification. Skull: Normal. Negative for fracture or focal lesion. Sinuses/Orbits: There is mild left ethmoid sinus mucosal thickening. Other: None. IMPRESSION: 1. Generalized cerebral atrophy. 2. No acute intracranial abnormality. 3. Mild left ethmoid sinus disease. Electronically Signed   By: Virgina Norfolk M.D.   On: 04/07/2020 17:19   MR BRAIN WO CONTRAST  Result Date: 04/08/2020 CLINICAL DATA:  Initial evaluation for delirium, dizziness. EXAM: MRI HEAD WITHOUT CONTRAST TECHNIQUE: Multiplanar, multiecho pulse sequences of the brain and surrounding structures were obtained without intravenous contrast. COMPARISON:  Prior head CT from 04/07/2020. FINDINGS: Brain: Moderately advanced age-related cerebral atrophy, stable from previous. 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 Single punctate subcentimeter focus of FLAIR hyperintensity at the left frontal centrum semi ovale, stable, and of doubtful significance  in isolation. No other significant cerebral white matter disease or focal parenchymal signal abnormality. No abnormal foci of restricted diffusion to suggest acute or subacute ischemia. Gray-white matter differentiation maintained. No other areas of encephalomalacia to suggest chronic cortical infarction. No foci of susceptibility artifact to suggest acute or chronic intracranial hemorrhage. No mass lesion, midline shift or mass effect. No hydrocephalus or extra-axial fluid collection. Pituitary gland suprasellar region normal. Midline structures intact. Vascular: Major intracranial vascular flow voids are maintained. Dominant left vertebral artery noted. Skull and upper cervical spine: Craniocervical junction within normal limits. Heterogeneous marrow signal intensity  seen throughout the visualized bone marrow. No focal marrow replacing lesions. No scalp soft tissue abnormality. Sinuses/Orbits: Globes and orbital soft tissues demonstrate no acute finding. Scattered mucosal thickening noted throughout the paranasal sinuses. Superimposed left maxillary sinus retention cyst noted. Bilateral mastoid effusions noted. Inner ear structures normal. Visualized nasopharynx within normal limits. Other: None. IMPRESSION: 1. No acute intracranial abnormality. 2. 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 3. Moderately advanced age-related cerebral atrophy. 4. Bilateral mastoid effusions, of uncertain significance. Correlation with physical exam suggested. Electronically Signed   By: Jeannine Boga M.D.   On: 04/08/2020 19:57    Microbiology: Recent Results (from the past 240 hour(s))  SARS CORONAVIRUS 2 (TAT 6-24 HRS) Nasopharyngeal Nasopharyngeal Swab     Status: None   Collection Time: 04/07/20  9:14 PM   Specimen: Nasopharyngeal Swab  Result Value Ref Range Status   SARS Coronavirus 2 NEGATIVE NEGATIVE Final    Comment: (NOTE) SARS-CoV-2 target nucleic acids are NOT DETECTED.  The SARS-CoV-2 RNA is generally detectable in upper and lower respiratory specimens during the acute phase of infection. Negative results do not preclude SARS-CoV-2 infection, do not rule out co-infections with other pathogens, and should not be used as the sole basis for treatment or other patient management decisions. Negative results must be combined with clinical observations, patient history, and epidemiological information. The expected result is Negative.  Fact Sheet for Patients: SugarRoll.be  Fact Sheet for Healthcare Providers: https://www.woods-mathews.com/  This test is not yet approved or cleared by the [place] FDA and  has been authorized for detection and/or diagnosis of SARS-CoV-2 by FDA  under an Emergency Use Authorization (EUA). This EUA will remain  in effect (meaning this test can be used) for the duration of the COVID-19 declaration under Se ction 564(b)(1) of the Act, 21 U.S.C. section 360bbb-3(b)(1), unless the authorization is terminated or revoked sooner.  Performed at [facility], [place] [address], [place], [place] [address]   MRSA PCR Screening     Status: Abnormal   Collection Time: 04/08/20 12:28 PM   Specimen: Nasal Mucosa; Nasopharyngeal  Result Value Ref Range Status   MRSA by PCR POSITIVE (A) NEGATIVE Final    Comment:        The GeneXpert MRSA Assay (FDA approved for NASAL specimens only), is one component of a comprehensive MRSA colonization surveillance program. It is not intended to diagnose MRSA infection nor to guide or monitor treatment for MRSA infections. RESULT CALLED TO, READ BACK BY AND VERIFIED WITH: Laurette Schimke RN 15:20 04/08/20 ([email or web address]) Performed at [facility], [place] [address], [place], [place] [address]      Labs: Basic Metabolic Panel: Recent Labs  Lab 04/08/20 0548 04/08/20 1305 04/09/20 0400 04/10/20 0500 04/11/20 0525  NA 137 140 137 139 141  K 4.4 4.2 3.6 3.0* 3.3*  CL 105 107 106 108  108  CO2 21* 23 23 22 22   GLUCOSE 63* 108* 128* 104* 98  BUN 5* <5* <5* <5* <5*  CREATININE 1.12* 1.14* 1.00 1.13* 1.36*  CALCIUM 7.7* 8.0* 8.0* 7.6* 7.6*  MG 2.1 1.9  --   --   --   PHOS 3.8 3.7  --   --   --    Liver Function Tests: Recent Labs  Lab 04/08/20 0548 04/08/20 1305 04/09/20 0400 04/10/20 0500 04/11/20 0525  AST 162* 224* 144* 78* 70*  ALT 97* 128* 108* 76* 71*  ALKPHOS 119 134* 114 110 146*  BILITOT 0.6 1.5* 1.0 0.7 0.7  PROT 5.5* 6.2* 5.6* 4.8* 5.1*  ALBUMIN 1.8* 2.0* 1.7* 1.5* 1.6*   No results for input(s): LIPASE, AMYLASE in the last 168 hours. Recent Labs  Lab 04/08/20 0520  AMMONIA 26   CBC: Recent Labs  Lab 04/07/20 1738 04/08/20 1025 04/08/20 1607 04/08/20 2021 04/09/20 0400  04/09/20 1950 04/10/20 0500 04/11/20 0525  WBC 2.0*  --  2.4* 2.3* 2.2* 2.9* 4.6 7.6  NEUTROABS 0.6*  --  0.7* 0.7* 0.7*  --   --   --   HGB 4.9*   < > 10.8* 9.3* 8.3* 8.6* 7.8* 7.9*  HCT 14.4*   < > 32.9* 27.2* 24.1* 24.9* 22.7* 24.4*  MCV 114.3*  --  100.9* 100.4* 100.4* 100.4* 100.9* 103.4*  PLT 37*  --  42* 44* 44* 68* 62* 97*   < > = values in this interval not displayed.   Cardiac Enzymes: No results for input(s): CKTOTAL, CKMB, CKMBINDEX, TROPONINI in the last 168 hours. BNP: BNP (last 3 results) No results for input(s): BNP in the last 8760 hours.  ProBNP (last 3 results) No results for input(s): PROBNP in the last 8760 hours.  CBG: Recent Labs  Lab 04/10/20 1953 04/10/20 2304 04/11/20 0344 04/11/20 0834 04/11/20 1150  GLUCAP 98 106* 101* 130* 87   Signed:  Domenic Polite MD.  [facility] 04/11/2020, 2:19 PM

## 2020-04-11 NOTE — Plan of Care (Signed)
  Problem: Education: Goal: Knowledge of General Education information will improve Description: Including pain rating scale, medication(s)/side effects and non-pharmacologic comfort measures Outcome: Adequate for Discharge   Problem: Health Behavior/Discharge Planning: Goal: Ability to manage health-related needs will improve Outcome: Adequate for Discharge   Problem: Clinical Measurements: Goal: Ability to maintain clinical measurements within normal limits will improve Outcome: Adequate for Discharge Goal: Will remain free from infection Outcome: Adequate for Discharge Goal: Diagnostic test results will improve Outcome: Adequate for Discharge Goal: Respiratory complications will improve Outcome: Adequate for Discharge Goal: Cardiovascular complication will be avoided Outcome: Adequate for Discharge   Problem: Activity: Goal: Risk for activity intolerance will decrease Outcome: Adequate for Discharge   Problem: Nutrition: Goal: Adequate nutrition will be maintained Outcome: Adequate for Discharge   Problem: Coping: Goal: Level of anxiety will decrease Outcome: Adequate for Discharge   Problem: Elimination: Goal: Will not experience complications related to bowel motility Outcome: Adequate for Discharge Goal: Will not experience complications related to urinary retention Outcome: Adequate for Discharge   Problem: Pain Managment: Goal: General experience of comfort will improve Outcome: Adequate for Discharge   Problem: Safety: Goal: Ability to remain free from injury will improve Outcome: Adequate for Discharge   Problem: Skin Integrity: Goal: Risk for impaired skin integrity will decrease Outcome: Adequate for Discharge   Problem: Education: Goal: Knowledge of disease or condition will improve Outcome: Adequate for Discharge Goal: Understanding of discharge needs will improve Outcome: Adequate for Discharge

## 2020-04-11 NOTE — TOC Transition Note (Addendum)
Transition of Care [facility]) - CM/SW Discharge Note   Patient Details  Name: Brenda [name] MRN: [id number] Date of Birth: [birth year]  [facility] CM/SW Contact:  Zenon Mayo, RN Phone Number: 04/11/2020, 12:22 PM   Clinical Narrative:    NCM spoke with patient at bedside, she is for dc today, she states she lives in hotel with her boyfriend, he went to get gas and will be back.  He will transport her at dc.  Secretary is making follow up apt for her at the [facility] clinic.  NCM asked if could set up HHPT for patient, she states she does not want HHPT. But she would like to have the rolling walker, she is ok for [employer] to supply walker.  NCM made referral to [facility] with [employer], the walker will be brought up to room prior to dc. Patient states she goes to [facility] outpatient pharmacy to get her meds. NCM gave SA resources to patient.    Final next level of care: Home/Self Care Barriers to Discharge: No Barriers Identified   Patient Goals and CMS Choice Patient states their goals for this hospitalization and ongoing recovery are:: to get better      Discharge Placement                       Discharge Plan and Services In-house Referral: NA Discharge Planning Services: CM Consult Post Acute Care Choice: Durable Medical Equipment (walker)          DME Arranged: Gilford Rile rolling DME Agency: [employer] Date DME Agency Contacted: 04/11/20 Time DME Agency Contacted: 1217 Representative spoke with at DME Agency: [place]: Refused [place]          Social Determinants of Health ([place]) Interventions     Readmission Risk Interventions Readmission Risk Prevention Plan 04/11/2020  Transportation Screening Complete  PCP or Specialist Appt within 3-5 Days Complete  HRI or [place] Complete  Social Work Consult for [place] Planning/Counseling Complete  Palliative Care Screening Not Applicable  Medication Review Press photographer) Complete  Some  recent data might be hidden

## 2020-04-11 NOTE — Progress Notes (Signed)
Physical Therapy Treatment Patient Details Name: Brenda [name] MRN: [id number] DOB: 1960-07-09 Today's Date: 04/11/2020    History of Present Illness 60 y.o. 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 Pt found to have Hgb of 4.9 and was given 3 units PRBC s/p 04/09/20 EGD where she ws found to have erosive gastropathy with no bleeding, gastritis with hemorrhage, and deudenitits.    PT Comments    Pt seated in recliner and denies dizziness this session.  Able to progress gt and perform stair training this session.  Mild unsteadiness but no true LOB.  Continue to recommend HHPT at this time.     Follow Up Recommendations  Home health PT;Supervision for mobility/OOB     Equipment Recommendations  Rolling walker with 5" wheels    Recommendations for Other Services       Precautions / Restrictions Precautions Precautions: Fall Restrictions Weight Bearing Restrictions: No    Mobility  Bed Mobility               General bed mobility comments: Pt seated in recliner on arrival this session.  Transfers Overall transfer level: Needs assistance Equipment used: None Transfers: Sit to/from Stand Sit to Stand: Supervision Stand pivot transfers: Supervision       General transfer comment: Cues for sequencing and safety.  Ambulation/Gait Ambulation/Gait assistance: Min guard;Supervision Gait Distance (Feet): 120 Feet Assistive device: Rolling walker (2 wheeled) Gait Pattern/deviations: Step-through pattern;Wide base of support;Trunk flexed     General Gait Details: no complaints of dizziness this session.  Cues for reciprocal armswing.   Stairs Stairs: Yes Stairs assistance:  Supervision;Min guard Stair Management: One rail Right Number of Stairs: 10 General stair comments: R rail to ascend with supervision.  Min guard with L rail and R HHA to descend.   Wheelchair Mobility    Modified Rankin (Stroke Patients Only)       Balance Overall balance assessment: Needs assistance Sitting-balance support: No upper extremity supported;Feet supported Sitting balance-Leahy Scale: Good       Standing balance-Leahy Scale: Fair                              Cognition Arousal/Alertness: Awake/alert Behavior During Therapy: WFL for tasks assessed/performed Overall Cognitive Status: Within Functional Limits for tasks assessed Area of Impairment: Safety/judgement;Problem solving                         Safety/Judgement: Decreased awareness of safety   Problem Solving: Slow processing        Exercises      General Comments        Pertinent Vitals/Pain Pain Assessment: 0-10 Pain Location: headache Pain Descriptors / Indicators: Headache Pain Intervention(s): Monitored during session;Repositioned    Home Living                      Prior Function            PT Goals (current goals can now be found in the care plan section) Acute Rehab PT Goals Patient Stated Goal: to get better Potential to Achieve Goals: Fair Progress towards PT goals: Progressing toward goals  Frequency    Min 3X/week      PT Plan Current plan remains appropriate    Co-evaluation              AM-PAC PT "6 Clicks" Mobility   Outcome Measure  Help needed turning from your back to your side while in a flat bed without using bedrails?: None Help needed moving from lying on your back to sitting on the side of a flat bed without using bedrails?: None Help needed moving to and from a bed to a chair (including a wheelchair)?: A Little Help needed standing up from a chair using your arms (e.g., wheelchair or bedside chair)?: A  Little Help needed to walk in hospital room?: A Little Help needed climbing 3-5 steps with a railing? : A Lot 6 Click Score: 19    End of Session Equipment Utilized During Treatment: Gait belt Activity Tolerance: Treatment limited secondary to medical complications (Comment) Patient left: in bed;with call bell/phone within reach;with bed alarm set Nurse Communication: Mobility status PT Visit Diagnosis: Unsteadiness on feet (R26.81);Other abnormalities of gait and mobility (R26.89);Muscle weakness (generalized) (M62.81);Dizziness and giddiness (R42);Difficulty in walking, not elsewhere classified (R26.2)     Time: 1683-7290 PT Time Calculation (min) (ACUTE ONLY): 10 min  Charges:  $Gait Training: 8-22 mins                     Erasmo Leventhal , PTA Acute Rehabilitation Services Pager [phone] Office [phone]     Doctor Sheahan Eli Hose 04/11/2020, 11:30 AM

## 2020-04-17 ENCOUNTER — Inpatient Hospital Stay: Payer: Medicaid Other

## 2020-04-17 ENCOUNTER — Other Ambulatory Visit: Payer: Self-pay | Admitting: Internal Medicine

## 2020-04-17 ENCOUNTER — Other Ambulatory Visit: Payer: Self-pay

## 2020-04-17 ENCOUNTER — Encounter: Payer: Self-pay | Admitting: Internal Medicine

## 2020-04-17 ENCOUNTER — Inpatient Hospital Stay (HOSPITAL_BASED_OUTPATIENT_CLINIC_OR_DEPARTMENT_OTHER): Payer: Medicaid Other | Admitting: Internal Medicine

## 2020-04-17 VITALS — BP 133/78 | HR 103 | Temp 97.3°F | Resp 17 | Ht 66.0 in | Wt 121.2 lb

## 2020-04-17 DIAGNOSIS — Z853 Personal history of malignant neoplasm of breast: Secondary | ICD-10-CM | POA: Diagnosis not present

## 2020-04-17 DIAGNOSIS — Z8673 Personal history of transient ischemic attack (TIA), and cerebral infarction without residual deficits: Secondary | ICD-10-CM | POA: Diagnosis not present

## 2020-04-17 DIAGNOSIS — J341 Cyst and mucocele of nose and nasal sinus: Secondary | ICD-10-CM | POA: Diagnosis not present

## 2020-04-17 DIAGNOSIS — C3491 Malignant neoplasm of unspecified part of right bronchus or lung: Secondary | ICD-10-CM

## 2020-04-17 DIAGNOSIS — Z5112 Encounter for antineoplastic immunotherapy: Secondary | ICD-10-CM | POA: Diagnosis not present

## 2020-04-17 DIAGNOSIS — Z79899 Other long term (current) drug therapy: Secondary | ICD-10-CM | POA: Diagnosis not present

## 2020-04-17 DIAGNOSIS — R42 Dizziness and giddiness: Secondary | ICD-10-CM | POA: Diagnosis not present

## 2020-04-17 DIAGNOSIS — Z5111 Encounter for antineoplastic chemotherapy: Secondary | ICD-10-CM | POA: Diagnosis present

## 2020-04-17 DIAGNOSIS — R590 Localized enlarged lymph nodes: Secondary | ICD-10-CM | POA: Diagnosis not present

## 2020-04-17 DIAGNOSIS — C3411 Malignant neoplasm of upper lobe, right bronchus or lung: Secondary | ICD-10-CM | POA: Diagnosis present

## 2020-04-17 DIAGNOSIS — D61818 Other pancytopenia: Secondary | ICD-10-CM | POA: Diagnosis not present

## 2020-04-17 DIAGNOSIS — E876 Hypokalemia: Secondary | ICD-10-CM

## 2020-04-17 DIAGNOSIS — Z886 Allergy status to analgesic agent status: Secondary | ICD-10-CM | POA: Diagnosis not present

## 2020-04-17 DIAGNOSIS — R5383 Other fatigue: Secondary | ICD-10-CM | POA: Diagnosis not present

## 2020-04-17 DIAGNOSIS — R410 Disorientation, unspecified: Secondary | ICD-10-CM | POA: Diagnosis not present

## 2020-04-17 DIAGNOSIS — D709 Neutropenia, unspecified: Secondary | ICD-10-CM

## 2020-04-17 DIAGNOSIS — M7989 Other specified soft tissue disorders: Secondary | ICD-10-CM | POA: Diagnosis not present

## 2020-04-17 LAB — CBC WITH DIFFERENTIAL (CANCER CENTER ONLY)
Abs Immature Granulocytes: 0.11 10*3/uL — ABNORMAL HIGH (ref 0.00–0.07)
Basophils Absolute: 0 10*3/uL (ref 0.0–0.1)
Basophils Relative: 0 %
Eosinophils Absolute: 0 10*3/uL (ref 0.0–0.5)
Eosinophils Relative: 0 %
HCT: 26.4 % — ABNORMAL LOW (ref 36.0–46.0)
Hemoglobin: 8.9 g/dL — ABNORMAL LOW (ref 12.0–15.0)
Immature Granulocytes: 2 %
Lymphocytes Relative: 29 %
Lymphs Abs: 1.7 10*3/uL (ref 0.7–4.0)
MCH: 34 pg (ref 26.0–34.0)
MCHC: 33.7 g/dL (ref 30.0–36.0)
MCV: 100.8 fL — ABNORMAL HIGH (ref 80.0–100.0)
Monocytes Absolute: 0.8 10*3/uL (ref 0.1–1.0)
Monocytes Relative: 13 %
Neutro Abs: 3.3 10*3/uL (ref 1.7–7.7)
Neutrophils Relative %: 56 %
Platelet Count: 316 10*3/uL (ref 150–400)
RBC: 2.62 MIL/uL — ABNORMAL LOW (ref 3.87–5.11)
RDW: 23.5 % — ABNORMAL HIGH (ref 11.5–15.5)
WBC Count: 5.9 10*3/uL (ref 4.0–10.5)
nRBC: 0 % (ref 0.0–0.2)

## 2020-04-17 LAB — CMP (CANCER CENTER ONLY)
ALT: 33 U/L (ref 0–44)
AST: 42 U/L — ABNORMAL HIGH (ref 15–41)
Albumin: 2 g/dL — ABNORMAL LOW (ref 3.5–5.0)
Alkaline Phosphatase: 223 U/L — ABNORMAL HIGH (ref 38–126)
Anion gap: 8 (ref 5–15)
BUN: <4 mg/dL — ABNORMAL LOW (ref 6–20)
CO2: 23 mmol/L (ref 22–32)
Calcium: 7.5 mg/dL — ABNORMAL LOW (ref 8.9–10.3)
Chloride: 108 mmol/L (ref 98–111)
Creatinine: 0.83 mg/dL (ref 0.44–1.00)
GFR, Estimated: >60 mL/min (ref >60)
Glucose, Bld: 70 mg/dL (ref 70–99)
Potassium: 2.8 mmol/L — ABNORMAL LOW (ref 3.5–5.1)
Sodium: 139 mmol/L (ref 135–145)
Total Bilirubin: 0.3 mg/dL (ref 0.3–1.2)
Total Protein: 6.2 g/dL — ABNORMAL LOW (ref 6.5–8.1)

## 2020-04-17 LAB — TSH: TSH: 1.41 u[IU]/mL (ref 0.308–3.960)

## 2020-04-17 MED ORDER — POTASSIUM CHLORIDE CRYS ER 20 MEQ PO TBCR: 20.0000 meq | EXTENDED_RELEASE_TABLET | Freq: Once | ORAL | 0 refills | Status: DC

## 2020-04-17 MED ORDER — POTASSIUM CHLORIDE 10 MEQ/100ML IV SOLN
10.0000 meq | INTRAVENOUS | Status: AC
  Administered 2020-04-17 (×2): 10 meq via INTRAVENOUS

## 2020-04-17 MED ORDER — POTASSIUM CHLORIDE 10 MEQ/100ML IV SOLN
INTRAVENOUS | Status: AC
  Filled 2020-04-17: qty 100

## 2020-04-17 MED ORDER — CYANOCOBALAMIN 1000 MCG/ML IJ SOLN
1000.0000 ug | Freq: Once | INTRAMUSCULAR | Status: AC
  Administered 2020-04-17: 1000 ug via INTRAMUSCULAR

## 2020-04-17 MED ORDER — SODIUM CHLORIDE 0.9 % IV SOLN
200.0000 mg | Freq: Once | INTRAVENOUS | Status: AC
  Administered 2020-04-17: 200 mg via INTRAVENOUS
  Filled 2020-04-17: qty 8

## 2020-04-17 MED ORDER — CYANOCOBALAMIN 1000 MCG/ML IJ SOLN
INTRAMUSCULAR | Status: AC
  Filled 2020-04-17: qty 1

## 2020-04-17 MED ORDER — SODIUM CHLORIDE 0.9% FLUSH
10.0000 mL | INTRAVENOUS | Status: DC | PRN
  Administered 2020-04-17: 10 mL
  Filled 2020-04-17: qty 10

## 2020-04-17 MED ORDER — SODIUM CHLORIDE 0.9% FLUSH
10.0000 mL | Freq: Once | INTRAVENOUS | Status: AC
  Administered 2020-04-17: 10 mL
  Filled 2020-04-17: qty 10

## 2020-04-17 MED ORDER — SODIUM CHLORIDE 0.9 % IV SOLN
Freq: Once | INTRAVENOUS | Status: AC
  Filled 2020-04-17: qty 250

## 2020-04-17 MED ORDER — PROCHLORPERAZINE MALEATE 10 MG PO TABS
ORAL_TABLET | ORAL | Status: AC
  Filled 2020-04-17: qty 1

## 2020-04-17 MED ORDER — SODIUM CHLORIDE 0.9 % IV SOLN
400.0000 mg/m2 | Freq: Once | INTRAVENOUS | Status: AC
  Administered 2020-04-17: 600 mg via INTRAVENOUS
  Filled 2020-04-17: qty 20

## 2020-04-17 MED ORDER — PROCHLORPERAZINE MALEATE 10 MG PO TABS
10.0000 mg | ORAL_TABLET | Freq: Once | ORAL | Status: AC
  Administered 2020-04-17: 10 mg via ORAL

## 2020-04-17 MED ORDER — HEPARIN SOD (PORK) LOCK FLUSH 100 UNIT/ML IV SOLN
500.0000 [IU] | Freq: Once | INTRAVENOUS | Status: AC | PRN
  Administered 2020-04-17: 500 [IU]
  Filled 2020-04-17: qty 5

## 2020-04-17 MED FILL — POTASSIUM CHLORIDE CRYS ER: 20 | 7 days supply | Qty: 7 | Fill #0

## 2020-04-17 NOTE — Progress Notes (Signed)
[place] [phone]   [phone]  OFFICE PROGRESS NOTE  Pcp, No No address on file  DIAGNOSIS: Stage IV (T2 a, N2, M1 B) non-small cell lung cancer, adenocarcinoma presented with large right upper lobe lung mass in addition to suspicious right paratracheal lymphadenopathy and bilateral pulmonary nodules as well as retroperitoneal lymphadenopathy diagnosed in May 2021. The patient has no actionable mutations and has negative PD-L1 expression on molecular studies by foundation 1.  PRIOR THERAPY: None  CURRENT THERAPY: Systemic chemotherapy with carboplatin for an AUC of 5, Alimta 500 mg/m2, and Keytruda 200 mg IV every 3 weeks. First dose 09/28/2019. Status post 8 cycles. Starting from cycle #5 the patient is on maintenance treatment with Alimta and Keytruda every 3 weeks.  INTERVAL HISTORY: Brenda [name] 60 y.o. female returns to the clinic today for follow-up visit.  The patient is feeling fine today with no concerning complaints.  She was admitted recently to [facility] with severe anemia secondary to gastrointestinal bleeding.  She was found at that time to have significant pancytopenia.  The patient received 2 units of PRBCs transfusion during that visit.  She also has upper endoscopy that showed erosive gastritis.  The patient was treated with IV Protonix.  She was also found to have evidence for drug abuse on the drug screening test..  The patient is recovering well and she is feeling much better.  She is here today for evaluation before resuming her treatment.  MEDICAL HISTORY: Past Medical History:  Diagnosis Date  . Breast cancer ([place])   . Depression   . Mental disorder     ALLERGIES:  is allergic to aspirin adult low [aspirin].  MEDICATIONS:  Current Outpatient Medications  Medication Sig Dispense Refill  . ferrous sulfate (FERROUSUL) 325 (65 FE) MG tablet Take 1 tablet (325 mg total) by mouth 2 (two) times daily with a  meal. 60 tablet 1  . folic acid (FOLVITE) 1 MG tablet Take 1 tablet (1 mg total) by mouth daily. 30 tablet 2  . lidocaine-prilocaine (EMLA) cream Apply 1 application topically as needed. 30 g 2  . pantoprazole (PROTONIX) 40 MG tablet Take 1 tablet (40 mg total) by mouth 2 (two) times daily. 60 tablet 0  . acetaminophen (TYLENOL) 325 MG tablet Take 2 tablets (650 mg total) by mouth every 6 (six) hours as needed for mild pain, moderate pain or headache. (Patient not taking: Reported on 04/17/2020)     No current facility-administered medications for this visit.    SURGICAL HISTORY:  Past Surgical History:  Procedure Laterality Date  . ESOPHAGOGASTRODUODENOSCOPY (EGD) WITH PROPOFOL N/A 04/09/2020   Procedure: ESOPHAGOGASTRODUODENOSCOPY (EGD) WITH PROPOFOL;  Surgeon: Doran Stabler, MD;  Location: [place];  Service: Gastroenterology;  Laterality: N/A;  . IR IMAGING GUIDED PORT INSERTION  11/03/2019  . TUBAL LIGATION    . VIDEO BRONCHOSCOPY WITH ENDOBRONCHIAL NAVIGATION N/A 04/04/2019   Procedure: VIDEO BRONCHOSCOPY WITH ENDOBRONCHIAL NAVIGATION;  Surgeon: Garner Nash, DO;  Location: [place];  Service: Thoracic;  Laterality: N/A;  . VIDEO BRONCHOSCOPY WITH ENDOBRONCHIAL ULTRASOUND N/A 04/04/2019   Procedure: VIDEO BRONCHOSCOPY WITH ENDOBRONCHIAL ULTRASOUND;  Surgeon: Garner Nash, DO;  Location: [facility];  Service: Thoracic;  Laterality: N/A;    REVIEW OF SYSTEMS:  A comprehensive review of systems was negative except for: Constitutional: positive for fatigue   PHYSICAL EXAMINATION: General appearance: alert, cooperative, fatigued and no distress Head: Normocephalic, without obvious abnormality, atraumatic Neck:  no adenopathy, no JVD, supple, symmetrical, trachea midline and thyroid not enlarged, symmetric, no tenderness/mass/nodules Lymph nodes: Cervical, supraclavicular, and axillary nodes normal. Resp: clear to auscultation bilaterally Back: symmetric, no curvature. ROM normal. No  CVA tenderness. Cardio: regular rate and rhythm, S1, S2 normal, no murmur, click, rub or gallop GI: soft, non-tender; bowel sounds normal; no masses,  no organomegaly Extremities: extremities normal, atraumatic, no cyanosis or edema  ECOG PERFORMANCE STATUS: 1 - Symptomatic but completely ambulatory  Blood pressure 133/78, pulse (!) 103, temperature (!) 97.3 F (36.3 C), temperature source Tympanic, resp. rate 17, height 5' 6"  (1.676 m), weight 121 lb 3.2 oz (55 kg), last menstrual period 01/19/2011, SpO2 100 %.  LABORATORY DATA: Lab Results  Component Value Date   WBC 5.9 04/17/2020   HGB 8.9 (L) 04/17/2020   HCT 26.4 (L) 04/17/2020   MCV 100.8 (H) 04/17/2020   PLT 316 04/17/2020      Chemistry      Component Value Date/Time   NA 141 04/11/2020 0525   K 3.3 (L) 04/11/2020 0525   CL 108 04/11/2020 0525   CO2 22 04/11/2020 0525   BUN <5 (L) 04/11/2020 0525   CREATININE 1.36 (H) 04/11/2020 0525   CREATININE 0.92 03/27/2020 1130      Component Value Date/Time   CALCIUM 7.6 (L) 04/11/2020 0525   ALKPHOS 146 (H) 04/11/2020 0525   AST 70 (H) 04/11/2020 0525   AST 115 (H) 03/27/2020 1130   ALT 71 (H) 04/11/2020 0525   ALT 78 (H) 03/27/2020 1130   BILITOT 0.7 04/11/2020 0525   BILITOT 0.5 03/27/2020 1130       RADIOGRAPHIC STUDIES: CT Head Wo Contrast  Result Date: 04/07/2020 CLINICAL DATA:  Confusion x2 days.  History of stage IV lung cancer. EXAM: CT HEAD WITHOUT CONTRAST TECHNIQUE: Contiguous axial images were obtained from the base of the skull through the vertex without intravenous contrast. COMPARISON:  September 11, 2019 FINDINGS: Brain: There is mild cerebral atrophy with widening of the extra-axial spaces and ventricular dilatation. There are areas of decreased attenuation within the white matter tracts of the supratentorial brain, consistent with microvascular disease changes. Vascular: No hyperdense vessel or unexpected calcification. Skull: Normal. Negative for fracture  or focal lesion. Sinuses/Orbits: There is mild left ethmoid sinus mucosal thickening. Other: None. IMPRESSION: 1. Generalized cerebral atrophy. 2. No acute intracranial abnormality. 3. Mild left ethmoid sinus disease. Electronically Signed   By: Virgina Norfolk M.D.   On: 04/07/2020 17:19   MR BRAIN WO CONTRAST  Result Date: 04/08/2020 CLINICAL DATA:  Initial evaluation for delirium, dizziness. EXAM: MRI HEAD WITHOUT CONTRAST TECHNIQUE: Multiplanar, multiecho pulse sequences of the brain and surrounding structures were obtained without intravenous contrast. COMPARISON:  Prior head CT from 04/07/2020. FINDINGS: Brain: Moderately advanced age-related cerebral atrophy, stable from previous. 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 Single punctate subcentimeter focus of FLAIR hyperintensity at the left frontal centrum semi ovale, stable, and of doubtful significance in isolation. No other significant cerebral white matter disease or focal parenchymal signal abnormality. No abnormal foci of restricted diffusion to suggest acute or subacute ischemia. Gray-white matter differentiation maintained. No other areas of encephalomalacia to suggest chronic cortical infarction. No foci of susceptibility artifact to suggest acute or chronic intracranial hemorrhage. No mass lesion, midline shift or mass effect. No hydrocephalus or extra-axial fluid collection. Pituitary gland suprasellar region normal. Midline structures intact. Vascular: Major intracranial vascular flow voids are maintained. Dominant left vertebral artery noted.  Skull and upper cervical spine: Craniocervical junction within normal limits. Heterogeneous marrow signal intensity seen throughout the visualized bone marrow. No focal marrow replacing lesions. No scalp soft tissue abnormality. Sinuses/Orbits: Globes and orbital soft tissues demonstrate no acute finding. Scattered mucosal thickening noted throughout the paranasal  sinuses. Superimposed left maxillary sinus retention cyst noted. Bilateral mastoid effusions noted. Inner ear structures normal. Visualized nasopharynx within normal limits. Other: None. IMPRESSION: 1. No acute intracranial abnormality. 2. 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 3. Moderately advanced age-related cerebral atrophy. 4. Bilateral mastoid effusions, of uncertain significance. Correlation with physical exam suggested. Electronically Signed   By: Jeannine Boga M.D.   On: 04/08/2020 19:57    ASSESSMENT AND PLAN: This is a very pleasant 60 years old African-American female with a stage IV non-small cell lung cancer, adenocarcinoma presented with large right upper lobe lung mass in addition to suspicious right paratracheal lymphadenopathy and bilateral pulmonary nodules as well as retroperitoneal lymphadenopathy diagnosed in May 2021 with no actionable mutations and negative PD-L1 expression. The patient is currently undergoing systemic chemotherapy with carboplatin for AUC of 5, Alimta 500 mg/M2 and Keytruda 200 mg IV every 3 weeks status post 9 cycles.  Starting from cycle #5 the patient is on treatment with maintenance therapy with Alimta and Keytruda every 3 weeks.   The patient has been tolerating her treatment well except for the recent episode of gastrointestinal bleeding secondary to drug abuse.  She also has pancytopenia related to her treatment. She is feeling much better today.  I recommended for her to resume her treatment with maintenance Alimta and Keytruda but I will reduce the dose of Alimta to 400 mg/M2 because of the recent pancytopenia. I will see the patient back for follow-up visit in 3 weeks for evaluation after repeating CT scan of the chest, abdomen pelvis for restaging of her disease. For the hypokalemia, the patient will receive potassium chloride 20 mEq IV and I will send prescription to her pharmacy with potassium chloride 20 mEq  p.o. daily for the next 7 days. The patient was advised to call immediately if she has any other concerning symptoms in the interval. She was advised to call immediately if she has any other concerning symptoms in the interval.  The patient voices understanding of current disease status and treatment options and is in agreement with the current care plan.  All questions were answered. The patient knows to call the clinic with any problems, questions or concerns. We can certainly see the patient much sooner if necessary.   Disclaimer: This note was dictated with voice recognition software. Similar sounding words can inadvertently be transcribed and may not be corrected upon review.

## 2020-04-17 NOTE — Progress Notes (Signed)
Patient will receive 20K+ IV and an order for oral K+ will be sent to the patients pharmacy per Dr. Julien Nordmann and patient is aware.

## 2020-04-17 NOTE — Patient Instructions (Signed)
[place] Discharge Instructions for Patients Receiving Chemotherapy  Today you received the following chemotherapy agents: pembrolizumab/pemetrexed.  To help prevent nausea and vomiting after your treatment, we encourage you to take your nausea medication as directed.   If you develop nausea and vomiting that is not controlled by your nausea medication, call the clinic.   BELOW ARE SYMPTOMS THAT SHOULD BE REPORTED IMMEDIATELY:  *FEVER GREATER THAN 100.5 F  *CHILLS WITH OR WITHOUT FEVER  NAUSEA AND VOMITING THAT IS NOT CONTROLLED WITH YOUR NAUSEA MEDICATION  *UNUSUAL SHORTNESS OF BREATH  *UNUSUAL BRUISING OR BLEEDING  TENDERNESS IN MOUTH AND THROAT WITH OR WITHOUT PRESENCE OF ULCERS  *URINARY PROBLEMS  *BOWEL PROBLEMS  UNUSUAL RASH Items with * indicate a potential emergency and should be followed up as soon as possible.  Feel free to call the clinic should you have any questions or concerns. The clinic phone number is [phone].  Please show the [place] at check-in to the Emergency Department and triage nurse.

## 2020-04-22 ENCOUNTER — Telehealth: Payer: Self-pay | Admitting: Internal Medicine

## 2020-04-22 ENCOUNTER — Telehealth: Payer: Self-pay | Admitting: Medical Oncology

## 2020-04-22 NOTE — Telephone Encounter (Signed)
New Swelling- Both feet and ankles swollen Left ankle  > right . She is staying off her legs. Pain > in left ankle. Asking for compression stockings and pain cream..

## 2020-04-22 NOTE — Telephone Encounter (Signed)
She can come and see Lucianne Lei at the symptom management clinic to make sure she does not have DVT.  Thank you.

## 2020-04-22 NOTE — Telephone Encounter (Signed)
Per 1/26 los, next appt already scheduled  

## 2020-04-23 ENCOUNTER — Telehealth: Payer: Self-pay

## 2020-04-23 NOTE — Telephone Encounter (Signed)
I have called the pt and advised as indicated. She said she was with her grandchild who was taking care of her and would call us back if she decided she wanted to schedule an appointment.    Patient Calls  Curt Bears, MD  [facility] 16 hours ago (8:00 PM)      She can come and see Lucianne Lei at the symptom management clinic to make sure she does not have DVT.  Thank you.      Documentation    Ardeen Garland, RN  Curt Bears, MD 22 hours ago (2:27 PM)    Any advice   Routing comment    Ardeen Garland, RN 22 hours ago (2:26 PM)       New Swelling- Both feet and ankles swollen Left ankle  > right . She is staying off her legs. Pain > in left ankle. Asking for compression stockings and pain cream..

## 2020-05-06 ENCOUNTER — Inpatient Hospital Stay: Payer: Medicaid Other | Admitting: Internal Medicine

## 2020-05-08 ENCOUNTER — Inpatient Hospital Stay: Payer: Medicaid Other

## 2020-05-08 ENCOUNTER — Inpatient Hospital Stay: Payer: Medicaid Other | Attending: Internal Medicine | Admitting: Internal Medicine

## 2020-05-21 ENCOUNTER — Ambulatory Visit (HOSPITAL_COMMUNITY)
Admission: RE | Admit: 2020-05-21 | Discharge: 2020-05-21 | Disposition: A | Discharge status: Home / Self Care | Payer: Medicaid Other | Source: Ambulatory Visit | Attending: Internal Medicine | Admitting: Internal Medicine

## 2020-05-21 ENCOUNTER — Encounter (HOSPITAL_COMMUNITY): Payer: Self-pay

## 2020-05-21 ENCOUNTER — Other Ambulatory Visit: Payer: Self-pay

## 2020-05-21 DIAGNOSIS — C3490 Malignant neoplasm of unspecified part of unspecified bronchus or lung: Secondary | ICD-10-CM | POA: Diagnosis present

## 2020-05-21 IMAGING — CT CT CHEST W/ CM
2 of 5 series · 13 of 36 positions shown, 16 images · IV contrast (OMNIPAQUE)
Comparison: [DATE]

CLINICAL DATA: Primary Cancer Type: Lung
TECHNIQUE: Multidetector CT imaging of the chest, abdomen and pelvis was
performed following the standard protocol during bolus
administration of intravenous contrast.

CONTRAST:  100mL OMNIPAQUE IOHEXOL 300 MG/ML  SOLN

[Series 2: cap with · axial · 0.65mm/px · z∈[-591,-71]mm · 10 of 128 slices shown, 13 images]
[im 12/128  mediastinal]
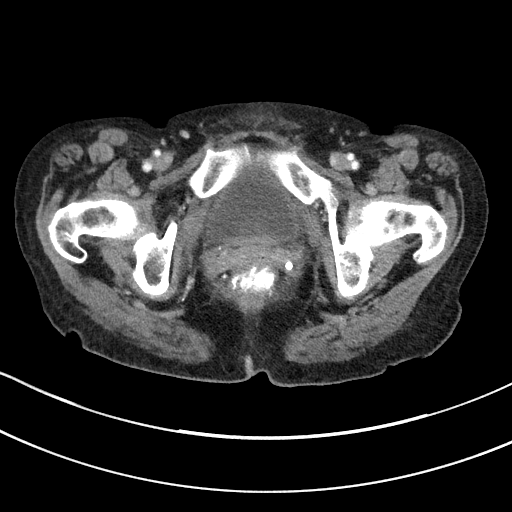
[im 12/128  lung]
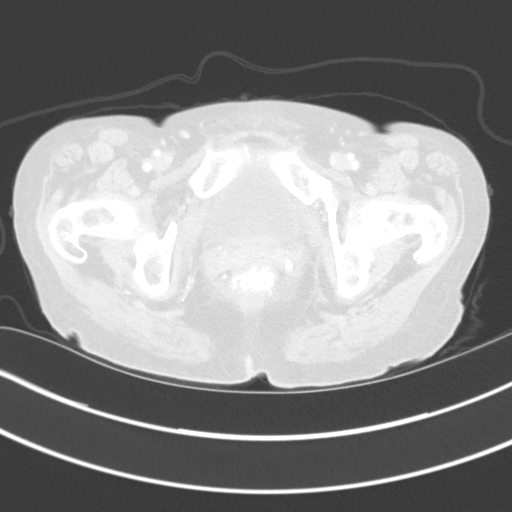
[im 24/128  lung]
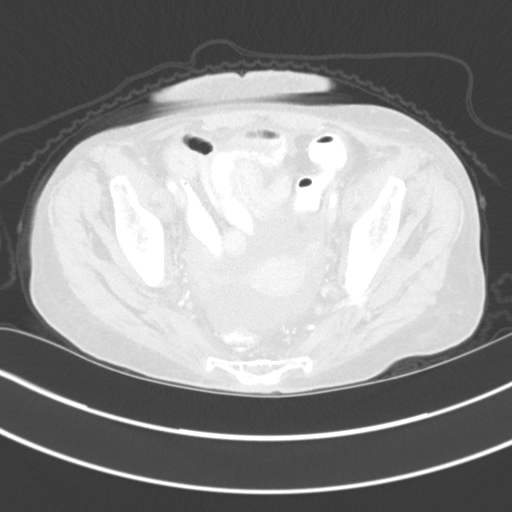
[im 35/128  lung]
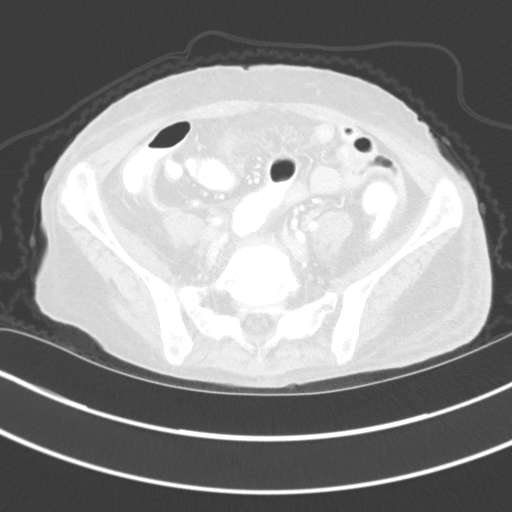
[im 47/128  lung]
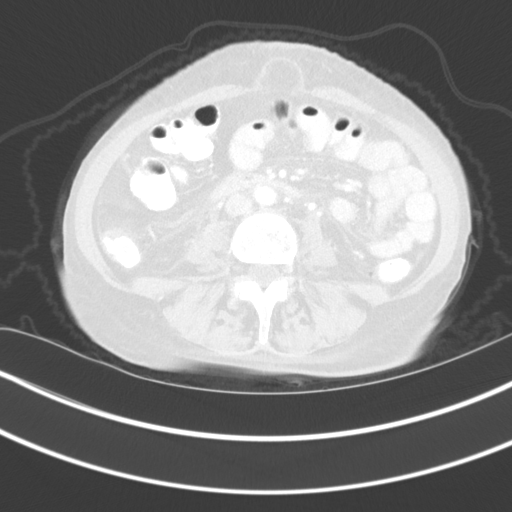
[im 58/128  mediastinal]
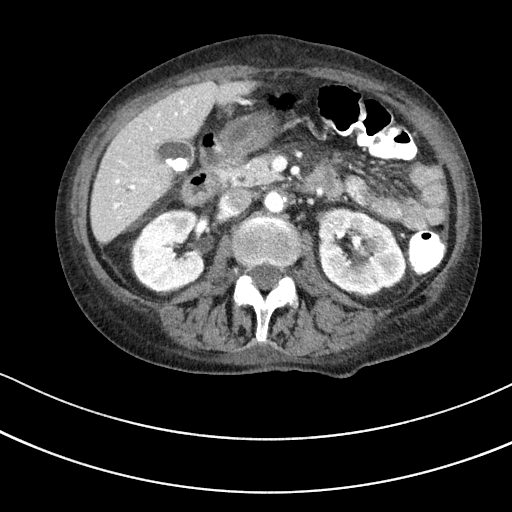
[im 58/128  lung]
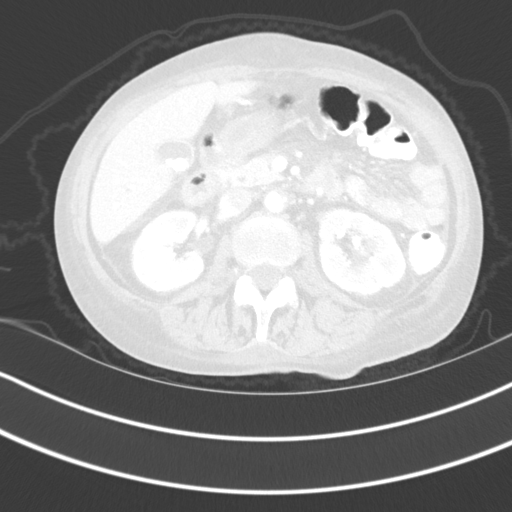
[im 70/128  lung]
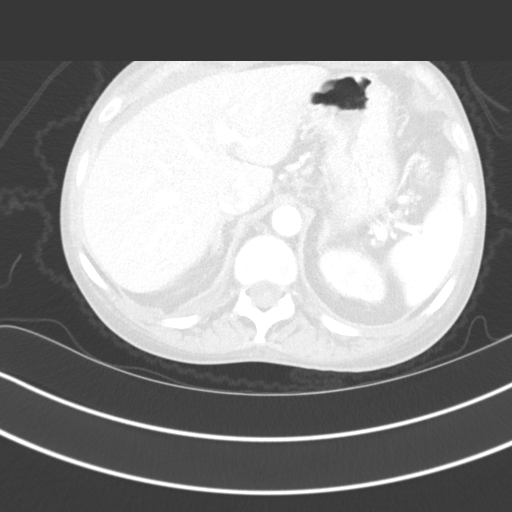
[im 81/128  lung]
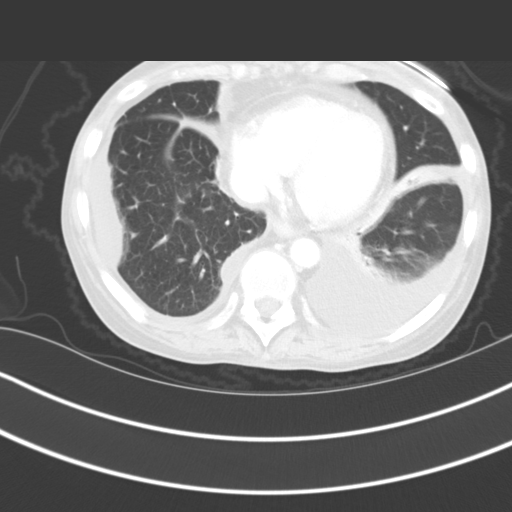
[im 93/128  lung]
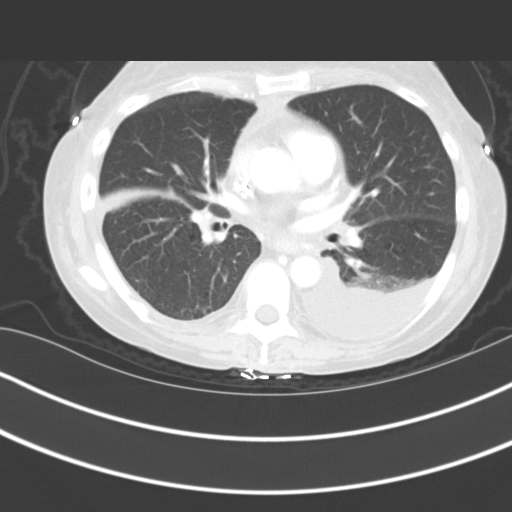
[im 104/128  mediastinal]
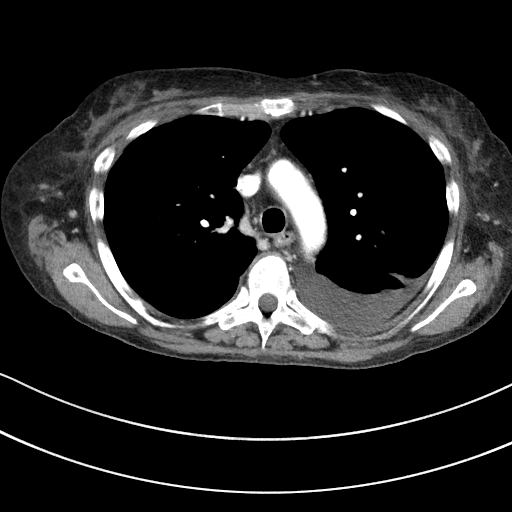
[im 104/128  lung]
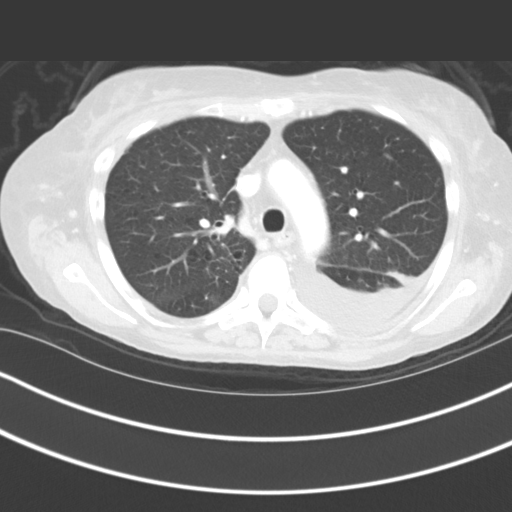
[im 116/128  lung]
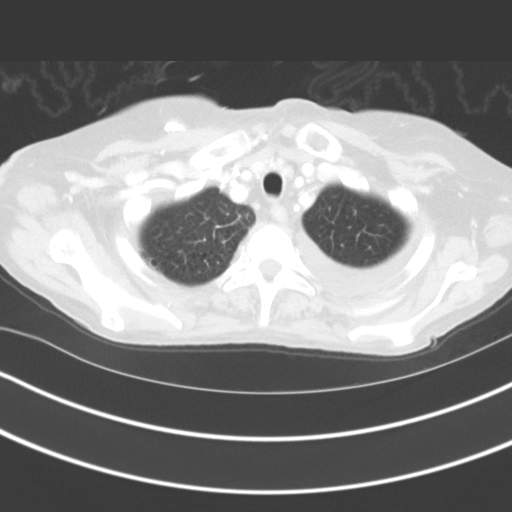

[Series 4: coronals · coronal · 0.75mm/px · 3 of 129 slices shown]
[im 26/129  lung]
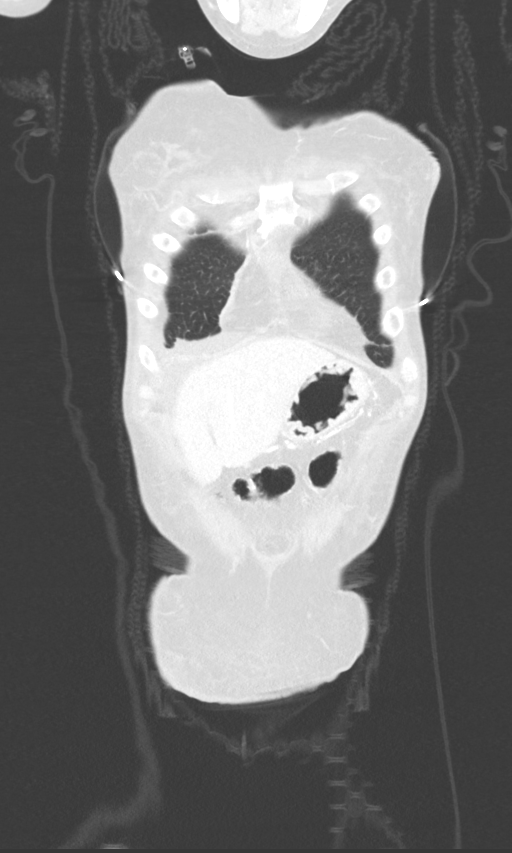
[im 52/129  lung]
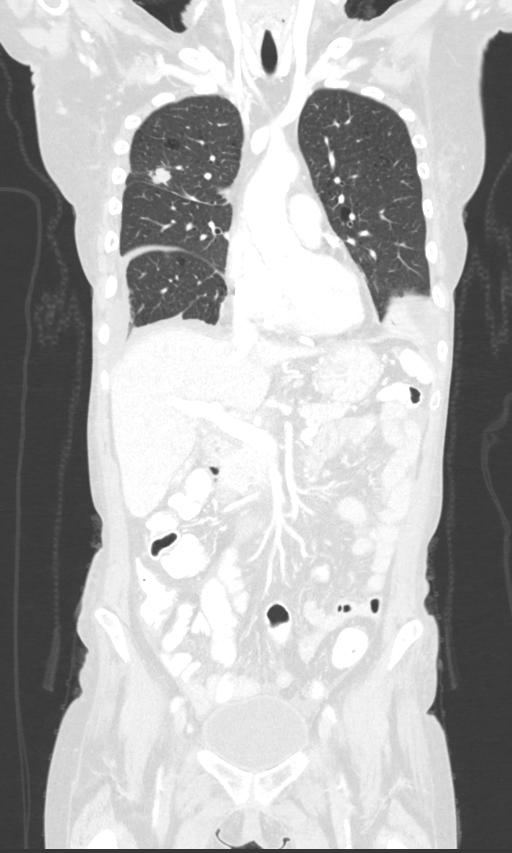
[im 77/129  lung]
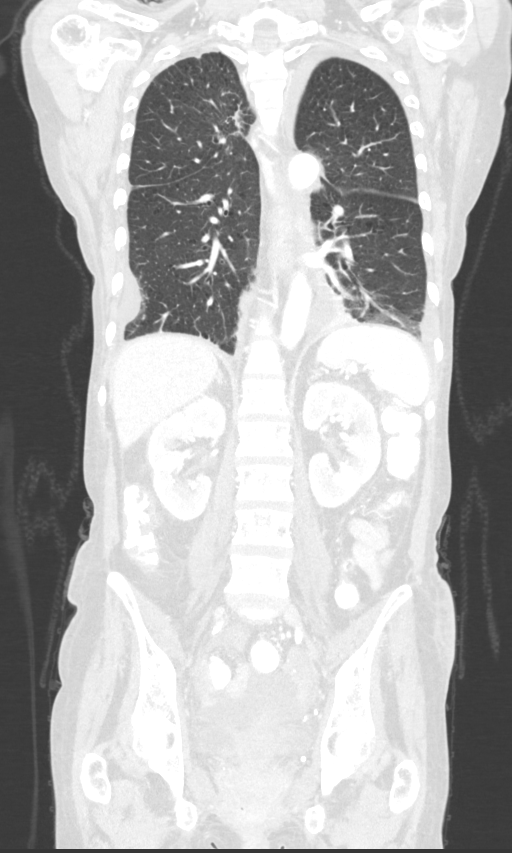

[13 of 36 positions shown; findings below may reference images not displayed]

Imaging Indication: Assess response to therapy

Interval therapy since last imaging? Yes

Initial Cancer Diagnosis

Date: [DATE]; Established by: Biopsy-proven

Detailed Pathology: Stage IV non-small cell lung cancer,
adenocarcinoma.

Primary Tumor location:  Right upper lobe.

Surgeries: No.

Chemotherapy: Yes; Ongoing? Yes; Most recent administration:
[DATE]

Immunotherapy?  Yes; Type: Keytruda; Ongoing? Yes

Radiation therapy? No

EXAM:
CT CHEST, ABDOMEN, AND PELVIS WITH CONTRAST
FINDINGS: CT CHEST FINDINGS

Cardiovascular: No acute findings.

Mediastinum/Lymph Nodes: No masses or pathologically enlarged lymph
nodes identified.

Lungs/Pleura: New small left pleural effusion and dependent left
lower lobe atelectasis. New tiny right pleural effusion also noted.

Spiculated nodule in the inferior right upper lobe adjacent to the
minor fissure has increased in size, currently measuring 2.1 x
cm image 69/6, compared to 1.5 x 1.5 cm previously. Adjacent nodule
in the inferior right upper lobe measures 1.5 x 0.6 cm, without
significant change. Another nodule in the medial right upper lobe
abutting the posterior mediastinum is stable in size, measuring
x 2.0 cm. No new pulmonary nodules or masses identified.

Musculoskeletal: No suspicious bone lesions or other significant
abnormality.

CT ABDOMEN AND PELVIS FINDINGS

Hepatobiliary: No masses identified. Gallstones are seen, however
there is no evidence of cholecystitis or biliary dilatation.

Pancreas:  No mass or inflammatory changes.

Spleen: Within normal limits in size and appearance.

Adrenals/Urinary Tract: No masses identified. No evidence of
ureteral calculi or hydronephrosis.

Stomach/Bowel: No evidence of obstruction, inflammatory process or
abnormal fluid collections. Aortic atherosclerotic calcification
noted.

Vascular/Lymphatic: No pathologically enlarged lymph nodes. No
abdominal aortic aneurysm. Aortic atherosclerotic calcification
noted.

Reproductive:  No mass or other significant abnormality.

Other: New mild ascites and diffuse mesenteric and body wall edema.
Two small paraumbilical and epigastric ventral hernias are again
seen which contain only omental fat.

Musculoskeletal:  No suspicious bone lesions identified.
IMPRESSION: Mild increase in size of 1 spiculated nodule in inferior right upper
lobe. Two other right upper lobe pulmonary nodules remains stable.

No abdominal or pelvic metastatic disease identified.

New small pleural effusions, mild ascites, and diffuse mesenteric
and body wall edema, consistent with 3rd spacing.

Cholelithiasis. No radiographic evidence of cholecystitis.

Stable small paraumbilical and epigastric ventral hernias which
contain only omental fat.

Aortic Atherosclerosis ([PF]-[PF]).

## 2020-05-21 IMAGING — CT CT ABD-PELV W/ CM
2 of 5 series · 13 of 36 positions shown, 16 images · IV contrast (OMNIPAQUE)
Comparison: [DATE]

CLINICAL DATA: Primary Cancer Type: Lung
TECHNIQUE: Multidetector CT imaging of the chest, abdomen and pelvis was
performed following the standard protocol during bolus
administration of intravenous contrast.

CONTRAST:  100mL OMNIPAQUE IOHEXOL 300 MG/ML  SOLN

[Series 2: cap with · axial · 0.65mm/px · z∈[-591,-71]mm · 10 of 128 slices shown, 13 images]
[im 12/128  mediastinal]
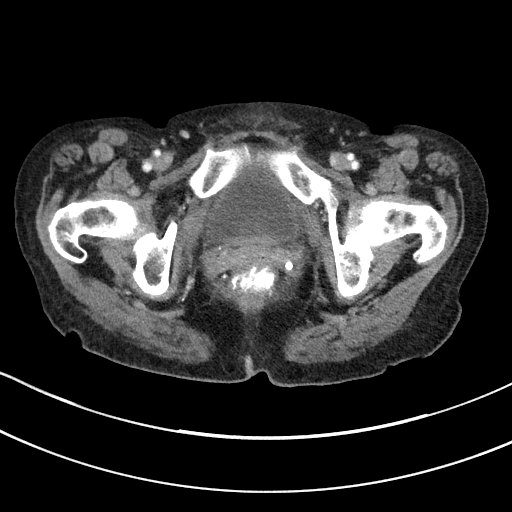
[im 12/128  lung]
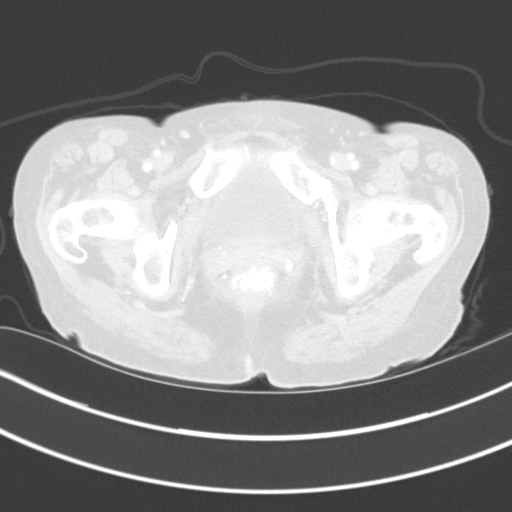
[im 24/128  lung]
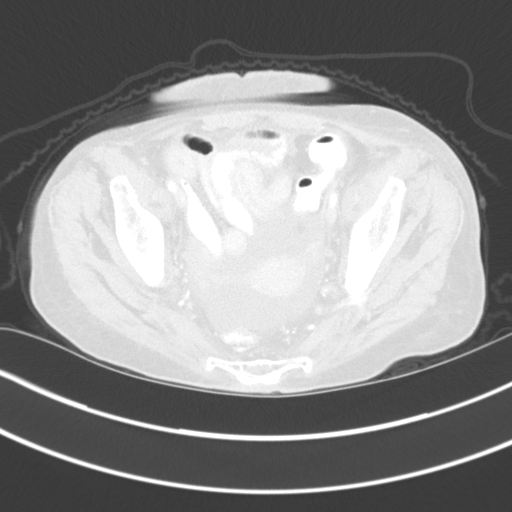
[im 35/128  lung]
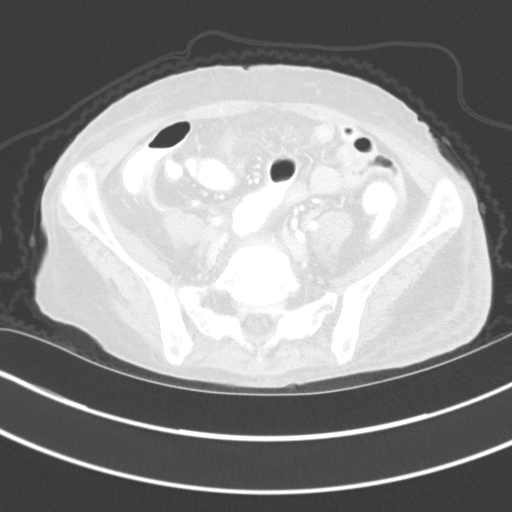
[im 47/128  lung]
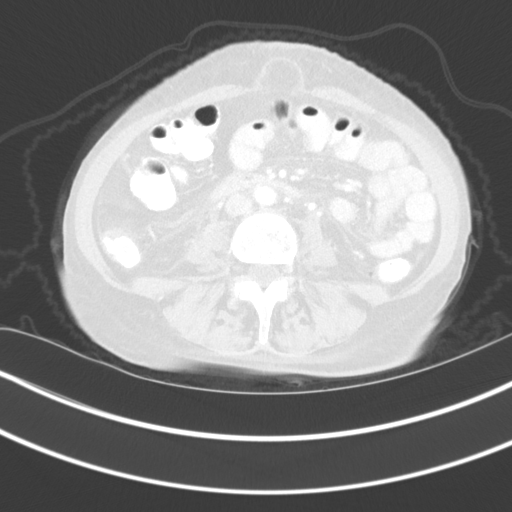
[im 58/128  mediastinal]
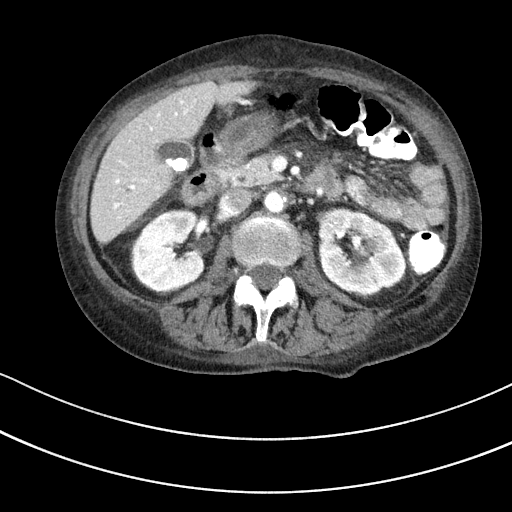
[im 58/128  lung]
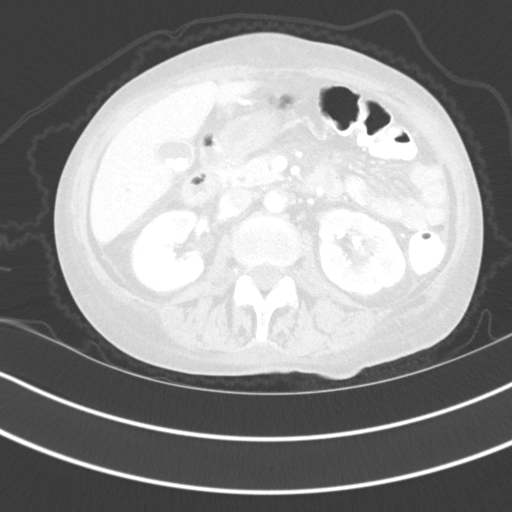
[im 70/128  lung]
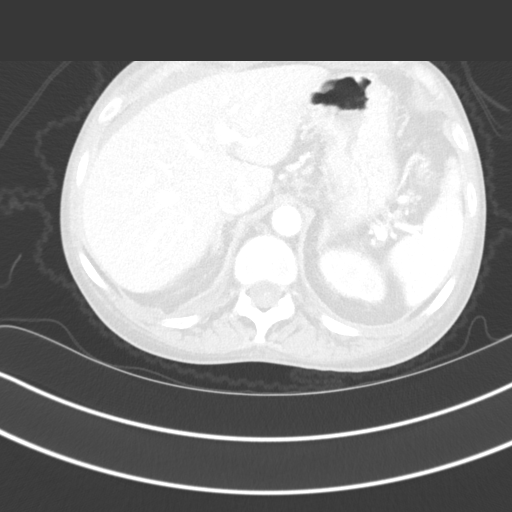
[im 81/128  lung]
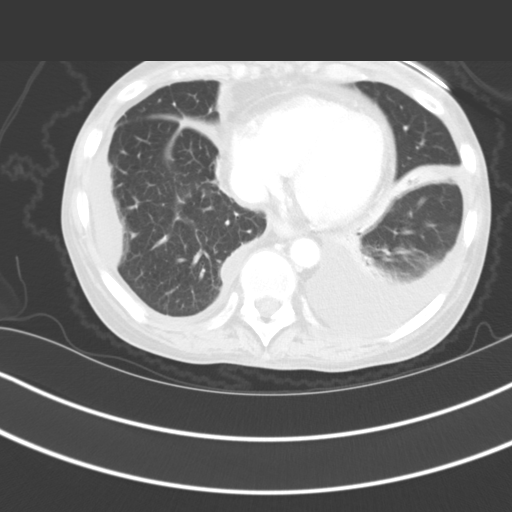
[im 93/128  lung]
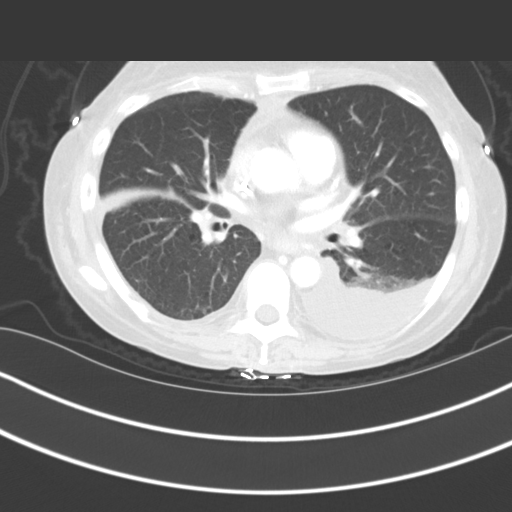
[im 104/128  mediastinal]
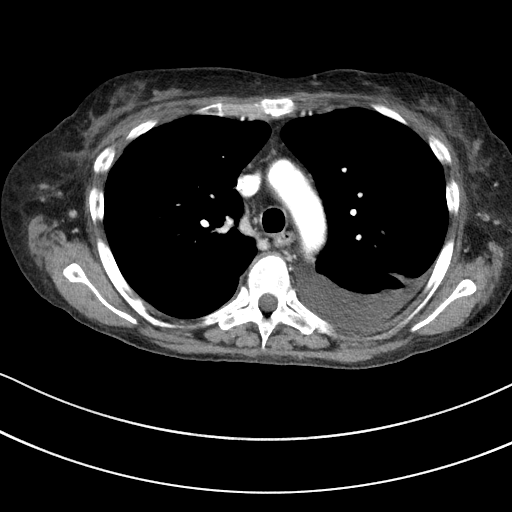
[im 104/128  lung]
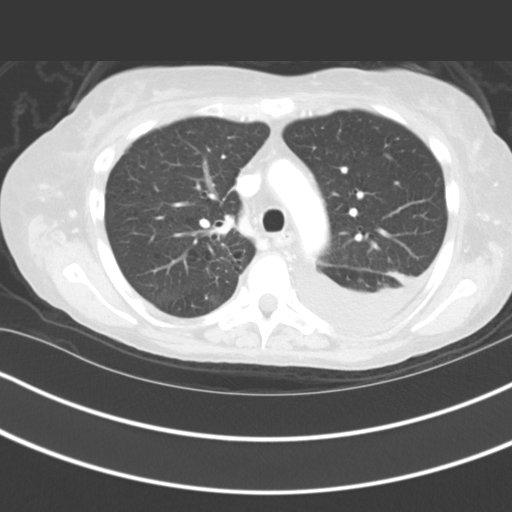
[im 116/128  lung]
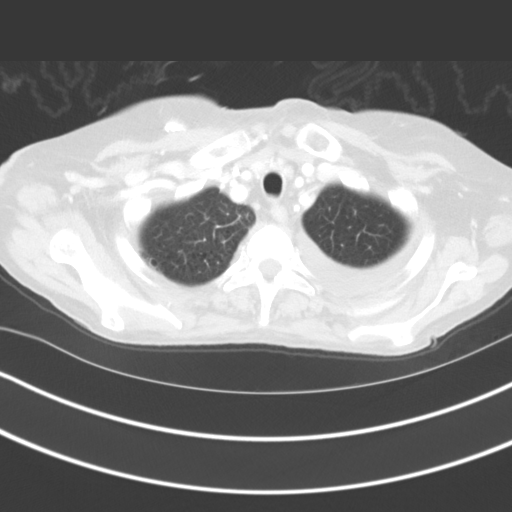

[Series 4: coronals · coronal · 0.75mm/px · 3 of 129 slices shown]
[im 26/129  lung]
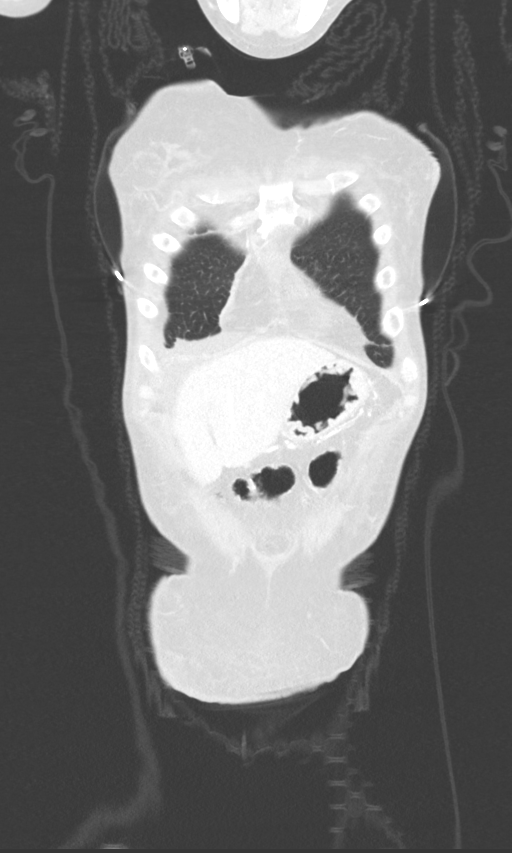
[im 52/129  lung]
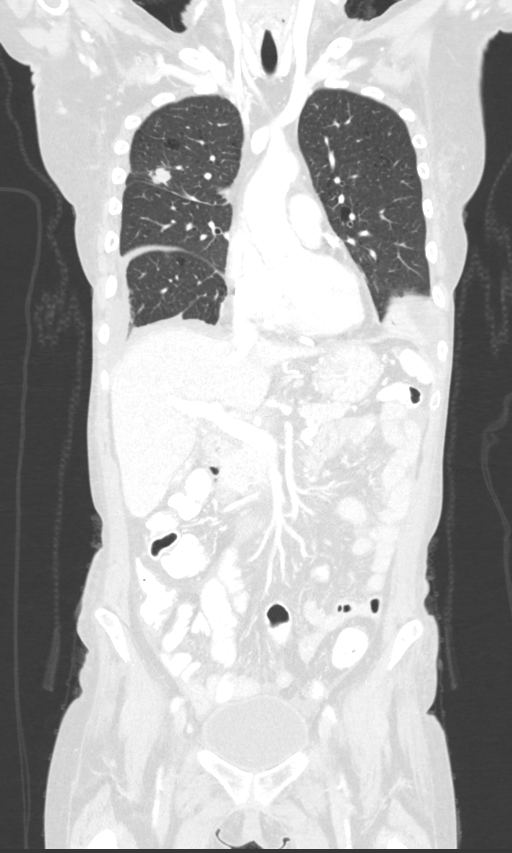
[im 77/129  lung]
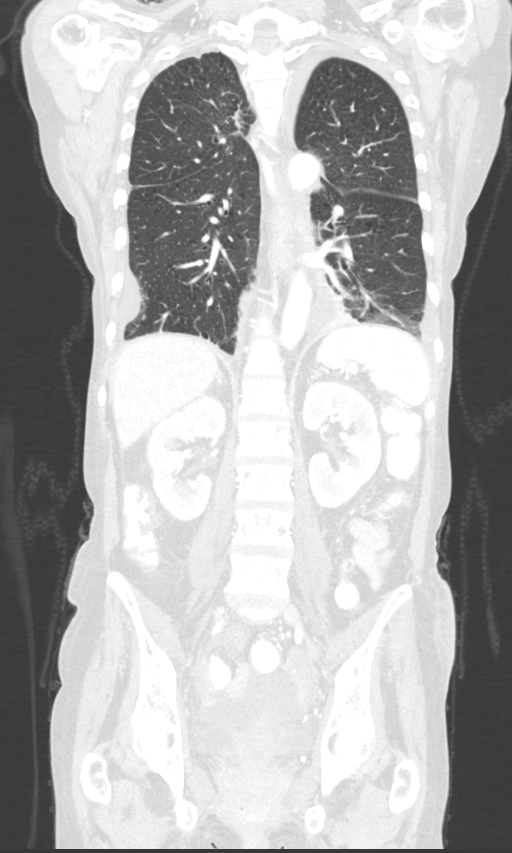

[13 of 36 positions shown; findings below may reference images not displayed]

Imaging Indication: Assess response to therapy

Interval therapy since last imaging? Yes

Initial Cancer Diagnosis

Date: [DATE]; Established by: Biopsy-proven

Detailed Pathology: Stage IV non-small cell lung cancer,
adenocarcinoma.

Primary Tumor location:  Right upper lobe.

Surgeries: No.

Chemotherapy: Yes; Ongoing? Yes; Most recent administration:
[DATE]

Immunotherapy?  Yes; Type: Keytruda; Ongoing? Yes

Radiation therapy? No

EXAM:
CT CHEST, ABDOMEN, AND PELVIS WITH CONTRAST
FINDINGS: CT CHEST FINDINGS

Cardiovascular: No acute findings.

Mediastinum/Lymph Nodes: No masses or pathologically enlarged lymph
nodes identified.

Lungs/Pleura: New small left pleural effusion and dependent left
lower lobe atelectasis. New tiny right pleural effusion also noted.

Spiculated nodule in the inferior right upper lobe adjacent to the
minor fissure has increased in size, currently measuring 2.1 x
cm image 69/6, compared to 1.5 x 1.5 cm previously. Adjacent nodule
in the inferior right upper lobe measures 1.5 x 0.6 cm, without
significant change. Another nodule in the medial right upper lobe
abutting the posterior mediastinum is stable in size, measuring
x 2.0 cm. No new pulmonary nodules or masses identified.

Musculoskeletal: No suspicious bone lesions or other significant
abnormality.

CT ABDOMEN AND PELVIS FINDINGS

Hepatobiliary: No masses identified. Gallstones are seen, however
there is no evidence of cholecystitis or biliary dilatation.

Pancreas:  No mass or inflammatory changes.

Spleen: Within normal limits in size and appearance.

Adrenals/Urinary Tract: No masses identified. No evidence of
ureteral calculi or hydronephrosis.

Stomach/Bowel: No evidence of obstruction, inflammatory process or
abnormal fluid collections. Aortic atherosclerotic calcification
noted.

Vascular/Lymphatic: No pathologically enlarged lymph nodes. No
abdominal aortic aneurysm. Aortic atherosclerotic calcification
noted.

Reproductive:  No mass or other significant abnormality.

Other: New mild ascites and diffuse mesenteric and body wall edema.
Two small paraumbilical and epigastric ventral hernias are again
seen which contain only omental fat.

Musculoskeletal:  No suspicious bone lesions identified.
IMPRESSION: Mild increase in size of 1 spiculated nodule in inferior right upper
lobe. Two other right upper lobe pulmonary nodules remains stable.

No abdominal or pelvic metastatic disease identified.

New small pleural effusions, mild ascites, and diffuse mesenteric
and body wall edema, consistent with 3rd spacing.

Cholelithiasis. No radiographic evidence of cholecystitis.

Stable small paraumbilical and epigastric ventral hernias which
contain only omental fat.

Aortic Atherosclerosis ([PF]-[PF]).

## 2020-05-21 MED ORDER — IOHEXOL 300 MG/ML  SOLN
100.0000 mL | Freq: Once | INTRAMUSCULAR | Status: AC | PRN
  Administered 2020-05-21: 100 mL via INTRAVENOUS

## 2020-05-21 MED ORDER — HEPARIN SOD (PORK) LOCK FLUSH 100 UNIT/ML IV SOLN
500.0000 [IU] | Freq: Once | INTRAVENOUS | Status: AC
  Administered 2020-05-21: 500 [IU] via INTRAVENOUS

## 2020-05-21 MED ORDER — HEPARIN SOD (PORK) LOCK FLUSH 100 UNIT/ML IV SOLN
INTRAVENOUS | Status: AC
  Filled 2020-05-21: qty 5

## 2020-05-24 NOTE — Progress Notes (Signed)
[place] OFFICE PROGRESS NOTE  Pcp, No No address on file  DIAGNOSIS: Stage IV (T2 a, N2, M1 B) non-small cell lung cancer, adenocarcinoma presented with large right upper lobe lung mass in addition to suspicious right paratracheal lymphadenopathy and bilateral pulmonary nodules as well as retroperitoneal lymphadenopathy diagnosed in May 2021. The patient has no actionable mutations and has negative PD-L1 expression on molecular studies by foundation 1.  PRIOR THERAPY: None  CURRENT THERAPY: Systemic chemotherapy with carboplatin for an AUC of 5, Alimta 500 mg/m2, and Keytruda 200 mg IV every 3 weeks. First dose 09/28/2019. Status post10cycles.Starting from cycle #5 she has been on maintenance alimta and [place]. She is on a reduced dose of alimta due to neutropenia.   INTERVAL HISTORY: Brenda [name] 60 y.o. female returns to the clinic today for a follow up visit. Her niece was available by phone. The patient is feeling fairly well today without any concerning complaints. She tolerated her last cycle of treatment well except she missed her appointment in February due to bilateral lower extremity swelling. She states she did not come in for her appointment because it was challenging to walk due to the swelling. Her swelling is improved at this time but not quite at her baseline. Of note, her albumin was very low at her last few lab draws. She is also very thin an cachetic. She does not eat a diet rich in protein. Her scan did note some third spacing. She has some darkening on her skin on her legs bilaterally. She does not drink boost/ensure.  Regarding her treatment, she denies any fever, chills, or night sweats. Denies any chest pain, significant cough, or hemoptysis. She also has some baseline dyspnea on exertion. She states she will feel short of breath if she goes up more than 10-11steps.Denies any diarrhea or constipation. She reports some nausea without vomiting. She  states if she takes her anti-emetic, it helps her nausea. Denies any headache or visual changesbesides her baseline visual changes for which she uses reading glasses. She denies every having her vision checked.She denies any signs or symptoms of infection including skin infections, nasal congestion, sore throat, or dysuria.The patient recently had a restaging CT scan performed. The patient is here today for evaluationand to review her scan before staring cycle #11.     MEDICAL HISTORY: Past Medical History:  Diagnosis Date  . Breast cancer ([place])   . Depression   . Mental disorder     ALLERGIES:  is allergic to aspirin adult low [aspirin].  MEDICATIONS:  Current Outpatient Medications  Medication Sig Dispense Refill  . acetaminophen (TYLENOL) 325 MG tablet Take 2 tablets (650 mg total) by mouth every 6 (six) hours as needed for mild pain, moderate pain or headache. (Patient not taking: Reported on 04/17/2020)    . ferrous sulfate (FERROUSUL) 325 (65 FE) MG tablet Take 1 tablet (325 mg total) by mouth 2 (two) times daily with a meal. 60 tablet 1  . folic acid (FOLVITE) 1 MG tablet Take 1 tablet (1 mg total) by mouth daily. 30 tablet 2  . lidocaine-prilocaine (EMLA) cream Apply 1 application topically as needed. 30 g 2  . pantoprazole (PROTONIX) 40 MG tablet Take 1 tablet (40 mg total) by mouth 2 (two) times daily. 60 tablet 0  . potassium chloride SA (KLOR-CON) 20 MEQ tablet Take 1 tablet (20 mEq total) by mouth once for 1 dose. 7 tablet 0   No current facility-administered medications for this visit.  SURGICAL HISTORY:  Past Surgical History:  Procedure Laterality Date  . ESOPHAGOGASTRODUODENOSCOPY (EGD) WITH PROPOFOL N/A 04/09/2020   Procedure: ESOPHAGOGASTRODUODENOSCOPY (EGD) WITH PROPOFOL;  Surgeon: Doran Stabler, MD;  Location: [place];  Service: Gastroenterology;  Laterality: N/A;  . IR IMAGING GUIDED PORT INSERTION  11/03/2019  . TUBAL LIGATION    . VIDEO  BRONCHOSCOPY WITH ENDOBRONCHIAL NAVIGATION N/A 04/04/2019   Procedure: VIDEO BRONCHOSCOPY WITH ENDOBRONCHIAL NAVIGATION;  Surgeon: Garner Nash, DO;  Location: [place];  Service: Thoracic;  Laterality: N/A;  . VIDEO BRONCHOSCOPY WITH ENDOBRONCHIAL ULTRASOUND N/A 04/04/2019   Procedure: VIDEO BRONCHOSCOPY WITH ENDOBRONCHIAL ULTRASOUND;  Surgeon: Garner Nash, DO;  Location: [place];  Service: Thoracic;  Laterality: N/A;    REVIEW OF SYSTEMS:   Review of Systems  Constitutional: Negative for appetite change, chills, fatigue, fever and unexpected weight change.  HENT: Negative for mouth sores, nosebleeds, sore throat and trouble swallowing.   Eyes: Negative for eye problems and icterus.  Respiratory: Positive for baseline mild dyspnea on exertion. Negative for cough, hemoptysis, and wheezing.   Cardiovascular: Positive for bilateral lower extremity swelling (improved). Negative for chest pain.  Gastrointestinal: Positive for mild intermittent nausea. Negative for abdominal pain, constipation, diarrhea, and vomiting.  Genitourinary: Negative for bladder incontinence, difficulty urinating, dysuria, frequency and hematuria.   Musculoskeletal: Negative for back pain, gait problem, neck pain and neck stiffness.  Skin: Positive for skin darkening on her lower extremities. Negative for itching and rash.  Neurological: Negative for dizziness, extremity weakness, gait problem, headaches, light-headedness and seizures.  Hematological: Negative for adenopathy. Does not bruise/bleed easily.  Psychiatric/Behavioral: Negative for confusion, depression and sleep disturbance. The patient is not nervous/anxious.     PHYSICAL EXAMINATION:  Last menstrual period 01/19/2011.  ECOG PERFORMANCE STATUS: 1 - Symptomatic but completely ambulatory  Physical Exam  Constitutional: Oriented to person, place, and time and cachetic appearing female in no acute distress. HENT:  Head: Normocephalic and atraumatic.   Mouth/Throat: Oropharynx is clear and moist. No oropharyngeal exudate.  Eyes: Conjunctivae are normal. Right eye exhibits no discharge. Left eye exhibits no discharge. No scleral icterus.  Neck: Normal range of motion. Neck supple.  Cardiovascular: Normal rate, regular rhythm, normal heart sounds and intact distal pulses.   Pulmonary/Chest: Effort normal and breath sounds normal. No respiratory distress. No wheezes. No rales.  Abdominal: Epigastric hernia noted. Soft. Bowel sounds are normal. Exhibits no distension and no mass. There is no tenderness.  Musculoskeletal: Mild bilateral lower extremity edema. Normal range of motion.  Lymphadenopathy:    No cervical adenopathy.  Neurological: Alert and oriented to person, place, and time. Exhibits normal muscle tone. Gait normal. Coordination normal.  Skin: Some skin darkening on lower extremities. Skin is warm and dry. No rash noted. Not diaphoretic. No erythema. No pallor.  Psychiatric: Mood, memory and judgment normal.  Vitals reviewed.  LABORATORY DATA: Lab Results  Component Value Date   WBC 5.9 04/17/2020   HGB 8.9 (L) 04/17/2020   HCT 26.4 (L) 04/17/2020   MCV 100.8 (H) 04/17/2020   PLT 316 04/17/2020      Chemistry      Component Value Date/Time   NA 139 04/17/2020 1150   K 2.8 (L) 04/17/2020 1150   CL 108 04/17/2020 1150   CO2 23 04/17/2020 1150   BUN <4 (L) 04/17/2020 1150   CREATININE 0.83 04/17/2020 1150      Component Value Date/Time   CALCIUM 7.5 (L) 04/17/2020 1150   ALKPHOS 223 (H)  04/17/2020 1150   AST 42 (H) 04/17/2020 1150   ALT 33 04/17/2020 1150   BILITOT 0.3 04/17/2020 1150       RADIOGRAPHIC STUDIES:  CT Chest W Contrast  Result Date: 05/21/2020 CLINICAL DATA:  Primary Cancer Type: Lung Imaging Indication: Assess response to therapy Interval therapy since last imaging? Yes Initial Cancer Diagnosis Date: 08/10/2019; Established by: Biopsy-proven Detailed Pathology: Stage IV non-small cell lung  cancer, adenocarcinoma. Primary Tumor location:  Right upper lobe. Surgeries: No. Chemotherapy: Yes; Ongoing? Yes; Most recent administration: 04/17/2020 Immunotherapy?  Yes; Type: Keytruda; Ongoing? Yes Radiation therapy? No EXAM: CT CHEST, ABDOMEN, AND PELVIS WITH CONTRAST TECHNIQUE: Multidetector CT imaging of the chest, abdomen and pelvis was performed following the standard protocol during bolus administration of intravenous contrast. CONTRAST:  19m OMNIPAQUE IOHEXOL 300 MG/ML  SOLN COMPARISON:  02/08/2020 FINDINGS: CT CHEST FINDINGS Cardiovascular: No acute findings. Mediastinum/Lymph Nodes: No masses or pathologically enlarged lymph nodes identified. Lungs/Pleura: New small left pleural effusion and dependent left lower lobe atelectasis. New tiny right pleural effusion also noted. Spiculated nodule in the inferior right upper lobe adjacent to the minor fissure has increased in size, currently measuring 2.1 x 2.0 cm image 69/6, compared to 1.5 x 1.5 cm previously. Adjacent nodule in the inferior right upper lobe measures 1.5 x 0.6 cm, without significant change. Another nodule in the medial right upper lobe abutting the posterior mediastinum is stable in size, measuring 2.2 x 2.0 cm. No new pulmonary nodules or masses identified. Musculoskeletal: No suspicious bone lesions or other significant abnormality. CT ABDOMEN AND PELVIS FINDINGS Hepatobiliary: No masses identified. Gallstones are seen, however there is no evidence of cholecystitis or biliary dilatation. Pancreas:  No mass or inflammatory changes. Spleen: Within normal limits in size and appearance. Adrenals/Urinary Tract: No masses identified. No evidence of ureteral calculi or hydronephrosis. Stomach/Bowel: No evidence of obstruction, inflammatory process or abnormal fluid collections. Aortic atherosclerotic calcification noted. Vascular/Lymphatic: No pathologically enlarged lymph nodes. No abdominal aortic aneurysm. Aortic atherosclerotic  calcification noted. Reproductive:  No mass or other significant abnormality. Other: New mild ascites and diffuse mesenteric and body wall edema. Two small paraumbilical and epigastric ventral hernias are again seen which contain only omental fat. Musculoskeletal:  No suspicious bone lesions identified. IMPRESSION: Mild increase in size of 1 spiculated nodule in inferior right upper lobe. Two other right upper lobe pulmonary nodules remains stable. No abdominal or pelvic metastatic disease identified. New small pleural effusions, mild ascites, and diffuse mesenteric and body wall edema, consistent with 3rd spacing. Cholelithiasis. No radiographic evidence of cholecystitis. Stable small paraumbilical and epigastric ventral hernias which contain only omental fat. Aortic Atherosclerosis (ICD10-I70.0). Electronically Signed   By: JMarlaine HindM.D.   On: 05/21/2020 10:36   CT Abdomen Pelvis W Contrast  Result Date: 05/21/2020 CLINICAL DATA:  Primary Cancer Type: Lung Imaging Indication: Assess response to therapy Interval therapy since last imaging? Yes Initial Cancer Diagnosis Date: 08/10/2019; Established by: Biopsy-proven Detailed Pathology: Stage IV non-small cell lung cancer, adenocarcinoma. Primary Tumor location:  Right upper lobe. Surgeries: No. Chemotherapy: Yes; Ongoing? Yes; Most recent administration: 04/17/2020 Immunotherapy?  Yes; Type: Keytruda; Ongoing? Yes Radiation therapy? No EXAM: CT CHEST, ABDOMEN, AND PELVIS WITH CONTRAST TECHNIQUE: Multidetector CT imaging of the chest, abdomen and pelvis was performed following the standard protocol during bolus administration of intravenous contrast. CONTRAST:  1055mOMNIPAQUE IOHEXOL 300 MG/ML  SOLN COMPARISON:  02/08/2020 FINDINGS: CT CHEST FINDINGS Cardiovascular: No acute findings. Mediastinum/Lymph Nodes: No masses or pathologically enlarged lymph  nodes identified. Lungs/Pleura: New small left pleural effusion and dependent left lower lobe atelectasis.  New tiny right pleural effusion also noted. Spiculated nodule in the inferior right upper lobe adjacent to the minor fissure has increased in size, currently measuring 2.1 x 2.0 cm image 69/6, compared to 1.5 x 1.5 cm previously. Adjacent nodule in the inferior right upper lobe measures 1.5 x 0.6 cm, without significant change. Another nodule in the medial right upper lobe abutting the posterior mediastinum is stable in size, measuring 2.2 x 2.0 cm. No new pulmonary nodules or masses identified. Musculoskeletal: No suspicious bone lesions or other significant abnormality. CT ABDOMEN AND PELVIS FINDINGS Hepatobiliary: No masses identified. Gallstones are seen, however there is no evidence of cholecystitis or biliary dilatation. Pancreas:  No mass or inflammatory changes. Spleen: Within normal limits in size and appearance. Adrenals/Urinary Tract: No masses identified. No evidence of ureteral calculi or hydronephrosis. Stomach/Bowel: No evidence of obstruction, inflammatory process or abnormal fluid collections. Aortic atherosclerotic calcification noted. Vascular/Lymphatic: No pathologically enlarged lymph nodes. No abdominal aortic aneurysm. Aortic atherosclerotic calcification noted. Reproductive:  No mass or other significant abnormality. Other: New mild ascites and diffuse mesenteric and body wall edema. Two small paraumbilical and epigastric ventral hernias are again seen which contain only omental fat. Musculoskeletal:  No suspicious bone lesions identified. IMPRESSION: Mild increase in size of 1 spiculated nodule in inferior right upper lobe. Two other right upper lobe pulmonary nodules remains stable. No abdominal or pelvic metastatic disease identified. New small pleural effusions, mild ascites, and diffuse mesenteric and body wall edema, consistent with 3rd spacing. Cholelithiasis. No radiographic evidence of cholecystitis. Stable small paraumbilical and epigastric ventral hernias which contain only omental  fat. Aortic Atherosclerosis (ICD10-I70.0). Electronically Signed   By: Marlaine Hind M.D.   On: 05/21/2020 10:36     ASSESSMENT/PLAN:  This is a very pleasant 60 year old African-American female recently diagnosed with a stage IV (T2 a, N2, M1 B) non-small cell lung cancer, adenocarcinoma presented with large right upper lobe lung mass in addition to suspicious right paratracheal lymphadenopathy and bilateral pulmonary nodules as well as retroperitoneal lymphadenopathy diagnosed in May 2021. The patient has no actionable mutations and has negative PD-L1 expression on molecular studies by foundation 1.  The patient is currently undergoing palliative systemic chemotherapy with carboplatin for AUC of 5, Alimta 500 mg/M2 and Keytruda 200 mg IV every 3 weeks. She is status post10cycles.Starting from cycle #5, she has been on maintenance alimta and Keytruda. Her dose of alimta was reduced due to neutropenia.   The patient recently had a restaging CT scan performed. Dr. Julien Nordmann personally and independently reviewed the scan and discussed the results with the patient. The scan showed mild increase in size of 1 spiculated nodule in inferior right upper lobe. Two other right upper lobe pulmonary nodules remains stable. There was also new small pleural effusions, mild ascites, and diffuse mesenteric and body wall edema, consistent with 3rd spacing. Dr. Julien Nordmann recommends that she continue on the same treatment. However, due to her slightly elevated creatinine today, Dr. Julien Nordmann recommends that she proceed today with just single agent immunotherapy with [facility] today pending the remaining labs studies are adequate. We will continue to hold her Alimta and will re-evaluate her creatinine with her next set of labs for her next cycle of treatment.   We will see her back for a follow up visit in 3 weeks for evaluationabefore starting cycle #12  I spent some time counseling the patient on  increasing her protein in  her diet which will likely help her third spacing/swelling. She will pick up boost/ensure. She also was given a handout on protein rich food. She was instructed to use compression stockings if needed and decrease the intake of salty foods.   The patient was advised to call immediately if she has any concerning symptoms in the interval. The patient voices understanding of current disease status and treatment options and is in agreement with the current care plan. All questions were answered. The patient knows to call the clinic with any problems, questions or concerns. We can certainly see the patient much sooner if necessary    No orders of the defined types were placed in this encounter.    I spent 20-29 minutes in this encounter.   Anthem Frazer L Bryttney Netzer, PA-C 05/24/20  ADDENDUM: Hematology/Oncology Attending: I had a face-to-face encounter with the patient today.  I reviewed her record, scan and recommended her care plan.  This is a 60 years old African-American female diagnosed with a stage IV non-small cell lung cancer, adenocarcinoma with no actionable mutation and negative PD-L1 expression. The patient was treated initially with induction systemic chemotherapy with carboplatin, Alimta and Keytruda followed by maintenance treatment with Alimta and Keytruda for a total dose of 10 cycles. She has been tolerating her treatment well but she missed the last cycle of her treatment because of swelling of the lower extremities and inability to come to the clinic today. The patient had repeat CT scan of the chest, abdomen pelvis performed recently.  I personally and independently reviewed the scan and discussed the results with the patient and her niece. Her scan showed no concerning findings for disease progression except for slight increase of spiculated nodule in the inferior right upper lobe. I recommended for the patient to proceed with her maintenance treatment with single agent  Keytruda for now because of the renal insufficiency. She will come back for follow-up visit in 3 weeks for evaluation before the next cycle of her treatment. The patient was advised to call immediately if she has any other concerning symptoms in the interval. The total time spent in the appointment was 35 minutes.  Disclaimer: This note was dictated with voice recognition software. Similar sounding words can inadvertently be transcribed and may be missed upon review. Eilleen Kempf, MD 05/29/20

## 2020-05-27 ENCOUNTER — Other Ambulatory Visit: Payer: Self-pay | Admitting: Physician Assistant

## 2020-05-27 DIAGNOSIS — D649 Anemia, unspecified: Secondary | ICD-10-CM

## 2020-05-29 ENCOUNTER — Inpatient Hospital Stay: Payer: Medicaid Other

## 2020-05-29 ENCOUNTER — Inpatient Hospital Stay: Payer: Medicaid Other | Attending: Internal Medicine

## 2020-05-29 ENCOUNTER — Other Ambulatory Visit: Payer: Self-pay

## 2020-05-29 ENCOUNTER — Telehealth: Payer: Self-pay | Admitting: Physician Assistant

## 2020-05-29 ENCOUNTER — Inpatient Hospital Stay (HOSPITAL_BASED_OUTPATIENT_CLINIC_OR_DEPARTMENT_OTHER): Payer: Medicaid Other | Admitting: Physician Assistant

## 2020-05-29 VITALS — BP 137/81 | HR 105 | Temp 97.5°F | Resp 17 | Ht 66.0 in | Wt 113.7 lb

## 2020-05-29 VITALS — HR 97

## 2020-05-29 DIAGNOSIS — R600 Localized edema: Secondary | ICD-10-CM | POA: Insufficient documentation

## 2020-05-29 DIAGNOSIS — Z886 Allergy status to analgesic agent status: Secondary | ICD-10-CM | POA: Diagnosis not present

## 2020-05-29 DIAGNOSIS — D709 Neutropenia, unspecified: Secondary | ICD-10-CM | POA: Insufficient documentation

## 2020-05-29 DIAGNOSIS — I219 Acute myocardial infarction, unspecified: Secondary | ICD-10-CM | POA: Diagnosis not present

## 2020-05-29 DIAGNOSIS — K8020 Calculus of gallbladder without cholecystitis without obstruction: Secondary | ICD-10-CM | POA: Diagnosis not present

## 2020-05-29 DIAGNOSIS — E8809 Other disorders of plasma-protein metabolism, not elsewhere classified: Secondary | ICD-10-CM | POA: Diagnosis not present

## 2020-05-29 DIAGNOSIS — R0609 Other forms of dyspnea: Secondary | ICD-10-CM | POA: Insufficient documentation

## 2020-05-29 DIAGNOSIS — M7989 Other specified soft tissue disorders: Secondary | ICD-10-CM | POA: Diagnosis not present

## 2020-05-29 DIAGNOSIS — C3491 Malignant neoplasm of unspecified part of right bronchus or lung: Secondary | ICD-10-CM

## 2020-05-29 DIAGNOSIS — J811 Chronic pulmonary edema: Secondary | ICD-10-CM | POA: Diagnosis not present

## 2020-05-29 DIAGNOSIS — J90 Pleural effusion, not elsewhere classified: Secondary | ICD-10-CM | POA: Insufficient documentation

## 2020-05-29 DIAGNOSIS — G629 Polyneuropathy, unspecified: Secondary | ICD-10-CM | POA: Insufficient documentation

## 2020-05-29 DIAGNOSIS — R188 Other ascites: Secondary | ICD-10-CM | POA: Diagnosis not present

## 2020-05-29 DIAGNOSIS — I700 Atherosclerosis of aorta: Secondary | ICD-10-CM | POA: Insufficient documentation

## 2020-05-29 DIAGNOSIS — Z5112 Encounter for antineoplastic immunotherapy: Secondary | ICD-10-CM

## 2020-05-29 DIAGNOSIS — D649 Anemia, unspecified: Secondary | ICD-10-CM

## 2020-05-29 DIAGNOSIS — C3411 Malignant neoplasm of upper lobe, right bronchus or lung: Secondary | ICD-10-CM | POA: Insufficient documentation

## 2020-05-29 DIAGNOSIS — Z5111 Encounter for antineoplastic chemotherapy: Secondary | ICD-10-CM | POA: Insufficient documentation

## 2020-05-29 DIAGNOSIS — N289 Disorder of kidney and ureter, unspecified: Secondary | ICD-10-CM | POA: Diagnosis not present

## 2020-05-29 DIAGNOSIS — Z79899 Other long term (current) drug therapy: Secondary | ICD-10-CM | POA: Diagnosis not present

## 2020-05-29 LAB — CMP (CANCER CENTER ONLY)
ALT: 8 U/L (ref 0–44)
AST: 22 U/L (ref 15–41)
Albumin: 2.2 g/dL — ABNORMAL LOW (ref 3.5–5.0)
Alkaline Phosphatase: 188 U/L — ABNORMAL HIGH (ref 38–126)
Anion gap: 7 (ref 5–15)
BUN: 7 mg/dL (ref 6–20)
CO2: 23 mmol/L (ref 22–32)
Calcium: 8.1 mg/dL — ABNORMAL LOW (ref 8.9–10.3)
Chloride: 109 mmol/L (ref 98–111)
Creatinine: 1.47 mg/dL — ABNORMAL HIGH (ref 0.44–1.00)
GFR, Estimated: 41 mL/min — ABNORMAL LOW (ref >60)
Glucose, Bld: 93 mg/dL (ref 70–99)
Potassium: 3.3 mmol/L — ABNORMAL LOW (ref 3.5–5.1)
Sodium: 139 mmol/L (ref 135–145)
Total Bilirubin: 0.4 mg/dL (ref 0.3–1.2)
Total Protein: 6.5 g/dL (ref 6.5–8.1)

## 2020-05-29 LAB — CBC WITH DIFFERENTIAL (CANCER CENTER ONLY)
Abs Immature Granulocytes: 0.03 10*3/uL (ref 0.00–0.07)
Basophils Absolute: 0 10*3/uL (ref 0.0–0.1)
Basophils Relative: 0 %
Eosinophils Absolute: 0 10*3/uL (ref 0.0–0.5)
Eosinophils Relative: 0 %
HCT: 24.1 % — ABNORMAL LOW (ref 36.0–46.0)
Hemoglobin: 8.1 g/dL — ABNORMAL LOW (ref 12.0–15.0)
Immature Granulocytes: 1 %
Lymphocytes Relative: 29 %
Lymphs Abs: 1.5 10*3/uL (ref 0.7–4.0)
MCH: 40.1 pg — ABNORMAL HIGH (ref 26.0–34.0)
MCHC: 33.6 g/dL (ref 30.0–36.0)
MCV: 119.3 fL — ABNORMAL HIGH (ref 80.0–100.0)
Monocytes Absolute: 0.7 10*3/uL (ref 0.1–1.0)
Monocytes Relative: 13 %
Neutro Abs: 2.8 10*3/uL (ref 1.7–7.7)
Neutrophils Relative %: 57 %
Platelet Count: 293 10*3/uL (ref 150–400)
RBC: 2.02 MIL/uL — ABNORMAL LOW (ref 3.87–5.11)
WBC Count: 5 10*3/uL (ref 4.0–10.5)
nRBC: 0 % (ref 0.0–0.2)

## 2020-05-29 LAB — TSH: TSH: 5.559 u[IU]/mL — ABNORMAL HIGH (ref 0.308–3.960)

## 2020-05-29 LAB — SAMPLE TO BLOOD BANK

## 2020-05-29 MED ORDER — SODIUM CHLORIDE 0.9 % IV SOLN
Freq: Once | INTRAVENOUS | Status: AC
  Filled 2020-05-29: qty 250

## 2020-05-29 MED ORDER — SODIUM CHLORIDE 0.9% FLUSH
10.0000 mL | INTRAVENOUS | Status: DC | PRN
  Administered 2020-05-29: 10 mL
  Filled 2020-05-29: qty 10

## 2020-05-29 MED ORDER — HEPARIN SOD (PORK) LOCK FLUSH 100 UNIT/ML IV SOLN
500.0000 [IU] | Freq: Once | INTRAVENOUS | Status: AC | PRN
  Administered 2020-05-29: 500 [IU]
  Filled 2020-05-29: qty 5

## 2020-05-29 MED ORDER — SODIUM CHLORIDE 0.9 % IV SOLN
200.0000 mg | Freq: Once | INTRAVENOUS | Status: AC
  Administered 2020-05-29: 200 mg via INTRAVENOUS
  Filled 2020-05-29: qty 8

## 2020-05-29 NOTE — Telephone Encounter (Signed)
Scheduled appt per 3/9 LOS - gave patient AVS and calender

## 2020-05-29 NOTE — Patient Instructions (Signed)

## 2020-05-29 NOTE — Patient Instructions (Signed)
[facility] Discharge Instructions for Patients Receiving Chemotherapy  Today you received the following chemotherapy agents: pembrolizumab.  To help prevent nausea and vomiting after your treatment, we encourage you to take your nausea medication as directed.   If you develop nausea and vomiting that is not controlled by your nausea medication, call the clinic.   BELOW ARE SYMPTOMS THAT SHOULD BE REPORTED IMMEDIATELY:  *FEVER GREATER THAN 100.5 F  *CHILLS WITH OR WITHOUT FEVER  NAUSEA AND VOMITING THAT IS NOT CONTROLLED WITH YOUR NAUSEA MEDICATION  *UNUSUAL SHORTNESS OF BREATH  *UNUSUAL BRUISING OR BLEEDING  TENDERNESS IN MOUTH AND THROAT WITH OR WITHOUT PRESENCE OF ULCERS  *URINARY PROBLEMS  *BOWEL PROBLEMS  UNUSUAL RASH Items with * indicate a potential emergency and should be followed up as soon as possible.  Feel free to call the clinic should you have any questions or concerns. The clinic phone number is [phone].  Please show the CHEMO ALERT CARD at check-in to the Emergency Department and triage nurse.   

## 2020-06-04 ENCOUNTER — Telehealth: Payer: Self-pay | Admitting: Medical

## 2020-06-04 ENCOUNTER — Telehealth: Payer: Self-pay | Admitting: Medical Oncology

## 2020-06-04 NOTE — Telephone Encounter (Signed)
Sharp Pain in feet -x 1 week. "going out  of my mind with pain on top of toes/feet . Feels hot /tingling/aching  on the top of toes and bottom of both feet. The pain  keeps me awake".  Soaked them , rubbing lotion on feet-nothing helping.

## 2020-06-04 NOTE — Telephone Encounter (Signed)
Pt does not have a PCP. She wants to see [id number] provider. Schedule message sent.

## 2020-06-04 NOTE — Telephone Encounter (Signed)
Called pt per 3/15 sch msg - left msg for pt with appt date and time

## 2020-06-04 NOTE — Telephone Encounter (Signed)
Her treatment is not associated with any severe neuropathy as she describes.  She can come and see Lucianne Lei at the symptom management clinic or see her primary care physician.  Thank you.

## 2020-06-05 ENCOUNTER — Telehealth: Payer: Self-pay

## 2020-06-05 ENCOUNTER — Inpatient Hospital Stay: Payer: Medicaid Other | Admitting: Medical

## 2020-06-05 NOTE — Progress Notes (Deleted)
[facility] Progress Note   Brenda [name] [id number] [birth year] 60 y.o.  Brenda [name] is managed by Dr. Fanny Bien. [name]  Actively treated with chemotherapy/immunotherapy/hormonal therapy: yes  Current therapy: Alimta and Keytruda  Last treated: 05/29/2020 (cycle #11)  Next scheduled appointment with provider: 06/18/2020  Assessment: Plan:    Neuropathy  Adenocarcinoma of right lung, stage 4 (HCC)   Neuropathy:  Stage IV adenocarcinoma of the right lung: [name] continues to be managed by Dr. Fanny Bien. [name] and is treated with Alimta and Keytruda.  She was last treated with cycle #11 on 05/29/2020.  She is scheduled to be seen for follow-up and for her next cycle of treatment on 06/18/2020  Please see After Visit Summary for patient specific instructions.  Future Appointments  Date Time Provider [place]  06/05/2020  1:00 PM Harle Stanford., PA-C [facility] None  06/18/2020 10:45 AM [facility] LAB [facility] None  06/18/2020 11:00 AM [facility] [place] FLUSH [facility] None  06/18/2020 11:30 AM Heilingoetter, Cassandra L, PA-C [facility] None  06/18/2020 12:30 PM [facility] INFUSION [facility] None  07/10/2020  8:45 AM [facility] LAB [facility] None  07/10/2020  9:00 AM [facility] [place] FLUSH [facility] None  07/10/2020  9:30 AM Heilingoetter, Cassandra L, PA-C [facility] None  07/10/2020 10:00 AM [facility] INFUSION [facility] None    No orders of the defined types were placed in this encounter.      Subjective:   Patient ID:  Brenda [name] is a 60 y.o. (DOB January 07, 1961) female.  Chief Complaint: No chief complaint on file.   HPI Brenda [name]  is a 60 y.o. female with a diagnosis of a stage IV non-small cell lung cancer, adenocarcinoma of the right lung.  At the time of her diagnosis she was found to also have suspicious right paratracheal lymphadenopathy and bilateral pulmonary nodules as well as retroperitoneal lymphadenopathy.   She is currently treated with maintenance Alimta and Keytruda and is status post cycle 11 which was given on 05/29/2020.  She contacted her office yesterday reporting that she has severe neuropathy in her feet and toes.  She reports that her feet and and toes feel hot, tingling, and aching.  She reports that she is "going out of my mind with pain on top of toes/feet.  Feels hot/tingling/aching on top of toes and bottom of both feet.  The pain keeps me awake: She has been soaking them and rubbing them with lotion but nothing seems to help.  According to a chart note, none of the medications that she is on should be causing the symptoms.  Medications: I have reviewed the patient's current medications.  Allergies:  Allergies  Allergen Reactions  . Aspirin Adult Low [Aspirin] Other (See Comments)    Stomach upset    Past Medical History:  Diagnosis Date  . Breast cancer ([place])   . Depression   . Mental disorder     Past Surgical History:  Procedure Laterality Date  . ESOPHAGOGASTRODUODENOSCOPY (EGD) WITH PROPOFOL N/A 04/09/2020   Procedure: ESOPHAGOGASTRODUODENOSCOPY (EGD) WITH PROPOFOL;  Surgeon: Doran Stabler, MD;  Location: [place];  Service: Gastroenterology;  Laterality: N/A;  . IR IMAGING GUIDED PORT INSERTION  11/03/2019  . TUBAL LIGATION    . VIDEO BRONCHOSCOPY WITH ENDOBRONCHIAL NAVIGATION N/A 04/04/2019   Procedure: VIDEO BRONCHOSCOPY WITH ENDOBRONCHIAL NAVIGATION;  Surgeon: Garner Nash, DO;  Location: [place];  Service: Thoracic;  Laterality: N/A;  . VIDEO BRONCHOSCOPY WITH ENDOBRONCHIAL ULTRASOUND N/A 04/04/2019   Procedure: VIDEO BRONCHOSCOPY  WITH ENDOBRONCHIAL ULTRASOUND;  Surgeon: Garner Nash, DO;  Location: [place];  Service: Thoracic;  Laterality: N/A;    No family history on file.  Social History   Socioeconomic History  . Marital status: Legally Separated    Spouse name: Not on file  . Number of children: Not on file  . Years of education: Not on file   . Highest education level: Not on file  Occupational History  . Not on file  Tobacco Use  . Smoking status: Former Smoker    Packs/day: 0.50    Years: 30.00    Pack years: 15.00    Types: Cigarettes    Quit date: 05/27/2019    Years since quitting: 1.0  . Smokeless tobacco: Never Used  Vaping Use  . Vaping Use: Never used  Substance and Sexual Activity  . Alcohol use: Yes    Alcohol/week: 84.0 standard drinks    Types: 84 Cans of beer per week  . Drug use: Yes    Types: Cocaine  . Sexual activity: Not on file  Other Topics Concern  . Not on file  Social History Narrative   ** Merged History Encounter **       Social Determinants of Health   Financial Resource Strain: Not on file  Food Insecurity: Not on file  Transportation Needs: Not on file  Physical Activity: Not on file  Stress: Not on file  Social Connections: Not on file  Intimate Partner Violence: Not on file    Past Medical History, Surgical history, Social history, and Family history were reviewed and updated as appropriate.   Please see review of systems for further details on the patient's review from today.   Review of Systems:  Review of Systems  Constitutional: Negative for chills, diaphoresis and fever.  HENT: Negative for trouble swallowing and voice change.   Respiratory: Negative for cough, chest tightness, shortness of breath and wheezing.   Cardiovascular: Negative for chest pain and palpitations.  Gastrointestinal: Negative for abdominal pain, constipation, diarrhea, nausea and vomiting.  Musculoskeletal: Negative for back pain and myalgias.  Neurological: Positive for numbness. Negative for dizziness, light-headedness and headaches.    Objective:   Physical Exam:  LMP 01/19/2011  ECOG: 0  Physical Exam Constitutional:      General: She is not in acute distress.    Appearance: She is not diaphoretic.  HENT:     Head: Normocephalic and atraumatic.  Cardiovascular:     Rate and  Rhythm: Normal rate and regular rhythm.     Heart sounds: Normal heart sounds. No murmur heard. No friction rub. No gallop.   Pulmonary:     Effort: Pulmonary effort is normal. No respiratory distress.     Breath sounds: Normal breath sounds. No wheezing or rales.  Skin:    General: Skin is warm and dry.     Findings: No erythema or rash.  Neurological:     Mental Status: She is alert.     Lab Review:     Component Value Date/Time   NA 139 05/29/2020 1118   K 3.3 (L) 05/29/2020 1118   CL 109 05/29/2020 1118   CO2 23 05/29/2020 1118   GLUCOSE 93 05/29/2020 1118   BUN 7 05/29/2020 1118   CREATININE 1.47 (H) 05/29/2020 1118   CALCIUM 8.1 (L) 05/29/2020 1118   PROT 6.5 05/29/2020 1118   ALBUMIN 2.2 (L) 05/29/2020 1118   AST 22 05/29/2020 1118   ALT 8 05/29/2020 1118  ALKPHOS 188 (H) 05/29/2020 1118   BILITOT 0.4 05/29/2020 1118   GFRNONAA 41 (L) 05/29/2020 1118   GFRAA >60 12/19/2019 1145       Component Value Date/Time   WBC 5.0 05/29/2020 1118   WBC 7.6 04/11/2020 0525   RBC 2.02 (L) 05/29/2020 1118   HGB 8.1 (L) 05/29/2020 1118   HCT 24.1 (L) 05/29/2020 1118   PLT 293 05/29/2020 1118   MCV 119.3 (H) 05/29/2020 1118   MCH 40.1 (H) 05/29/2020 1118   MCHC 33.6 05/29/2020 1118   RDW Not Measured 05/29/2020 1118   LYMPHSABS 1.5 05/29/2020 1118   MONOABS 0.7 05/29/2020 1118   EOSABS 0.0 05/29/2020 1118   BASOSABS 0.0 05/29/2020 1118   -------------------------------  Imaging from last 24 hours (if applicable):  Radiology interpretation: CT Chest W Contrast  Result Date: 05/21/2020 CLINICAL DATA:  Primary Cancer Type: Lung Imaging Indication: Assess response to therapy Interval therapy since last imaging? Yes Initial Cancer Diagnosis Date: 08/10/2019; Established by: Biopsy-proven Detailed Pathology: Stage IV non-small cell lung cancer, adenocarcinoma. Primary Tumor location:  Right upper lobe. Surgeries: No. Chemotherapy: Yes; Ongoing? Yes; Most recent  administration: 04/17/2020 Immunotherapy?  Yes; Type: Keytruda; Ongoing? Yes Radiation therapy? No EXAM: CT CHEST, ABDOMEN, AND PELVIS WITH CONTRAST TECHNIQUE: Multidetector CT imaging of the chest, abdomen and pelvis was performed following the standard protocol during bolus administration of intravenous contrast. CONTRAST:  133mL OMNIPAQUE IOHEXOL 300 MG/ML  SOLN COMPARISON:  02/08/2020 FINDINGS: CT CHEST FINDINGS Cardiovascular: No acute findings. Mediastinum/Lymph Nodes: No masses or pathologically enlarged lymph nodes identified. Lungs/Pleura: New small left pleural effusion and dependent left lower lobe atelectasis. New tiny right pleural effusion also noted. Spiculated nodule in the inferior right upper lobe adjacent to the minor fissure has increased in size, currently measuring 2.1 x 2.0 cm image 69/6, compared to 1.5 x 1.5 cm previously. Adjacent nodule in the inferior right upper lobe measures 1.5 x 0.6 cm, without significant change. Another nodule in the medial right upper lobe abutting the posterior mediastinum is stable in size, measuring 2.2 x 2.0 cm. No new pulmonary nodules or masses identified. Musculoskeletal: No suspicious bone lesions or other significant abnormality. CT ABDOMEN AND PELVIS FINDINGS Hepatobiliary: No masses identified. Gallstones are seen, however there is no evidence of cholecystitis or biliary dilatation. Pancreas:  No mass or inflammatory changes. Spleen: Within normal limits in size and appearance. Adrenals/Urinary Tract: No masses identified. No evidence of ureteral calculi or hydronephrosis. Stomach/Bowel: No evidence of obstruction, inflammatory process or abnormal fluid collections. Aortic atherosclerotic calcification noted. Vascular/Lymphatic: No pathologically enlarged lymph nodes. No abdominal aortic aneurysm. Aortic atherosclerotic calcification noted. Reproductive:  No mass or other significant abnormality. Other: New mild ascites and diffuse mesenteric and body  wall edema. Two small paraumbilical and epigastric ventral hernias are again seen which contain only omental fat. Musculoskeletal:  No suspicious bone lesions identified. IMPRESSION: Mild increase in size of 1 spiculated nodule in inferior right upper lobe. Two other right upper lobe pulmonary nodules remains stable. No abdominal or pelvic metastatic disease identified. New small pleural effusions, mild ascites, and diffuse mesenteric and body wall edema, consistent with 3rd spacing. Cholelithiasis. No radiographic evidence of cholecystitis. Stable small paraumbilical and epigastric ventral hernias which contain only omental fat. Aortic Atherosclerosis (ICD10-I70.0). Electronically Signed   By: Marlaine Hind M.D.   On: 05/21/2020 10:36   CT Abdomen Pelvis W Contrast  Result Date: 05/21/2020 CLINICAL DATA:  Primary Cancer Type: Lung Imaging Indication: Assess response to therapy Interval  therapy since last imaging? Yes Initial Cancer Diagnosis Date: 08/10/2019; Established by: Biopsy-proven Detailed Pathology: Stage IV non-small cell lung cancer, adenocarcinoma. Primary Tumor location:  Right upper lobe. Surgeries: No. Chemotherapy: Yes; Ongoing? Yes; Most recent administration: 04/17/2020 Immunotherapy?  Yes; Type: Keytruda; Ongoing? Yes Radiation therapy? No EXAM: CT CHEST, ABDOMEN, AND PELVIS WITH CONTRAST TECHNIQUE: Multidetector CT imaging of the chest, abdomen and pelvis was performed following the standard protocol during bolus administration of intravenous contrast. CONTRAST:  149mL OMNIPAQUE IOHEXOL 300 MG/ML  SOLN COMPARISON:  02/08/2020 FINDINGS: CT CHEST FINDINGS Cardiovascular: No acute findings. Mediastinum/Lymph Nodes: No masses or pathologically enlarged lymph nodes identified. Lungs/Pleura: New small left pleural effusion and dependent left lower lobe atelectasis. New tiny right pleural effusion also noted. Spiculated nodule in the inferior right upper lobe adjacent to the minor fissure has  increased in size, currently measuring 2.1 x 2.0 cm image 69/6, compared to 1.5 x 1.5 cm previously. Adjacent nodule in the inferior right upper lobe measures 1.5 x 0.6 cm, without significant change. Another nodule in the medial right upper lobe abutting the posterior mediastinum is stable in size, measuring 2.2 x 2.0 cm. No new pulmonary nodules or masses identified. Musculoskeletal: No suspicious bone lesions or other significant abnormality. CT ABDOMEN AND PELVIS FINDINGS Hepatobiliary: No masses identified. Gallstones are seen, however there is no evidence of cholecystitis or biliary dilatation. Pancreas:  No mass or inflammatory changes. Spleen: Within normal limits in size and appearance. Adrenals/Urinary Tract: No masses identified. No evidence of ureteral calculi or hydronephrosis. Stomach/Bowel: No evidence of obstruction, inflammatory process or abnormal fluid collections. Aortic atherosclerotic calcification noted. Vascular/Lymphatic: No pathologically enlarged lymph nodes. No abdominal aortic aneurysm. Aortic atherosclerotic calcification noted. Reproductive:  No mass or other significant abnormality. Other: New mild ascites and diffuse mesenteric and body wall edema. Two small paraumbilical and epigastric ventral hernias are again seen which contain only omental fat. Musculoskeletal:  No suspicious bone lesions identified. IMPRESSION: Mild increase in size of 1 spiculated nodule in inferior right upper lobe. Two other right upper lobe pulmonary nodules remains stable. No abdominal or pelvic metastatic disease identified. New small pleural effusions, mild ascites, and diffuse mesenteric and body wall edema, consistent with 3rd spacing. Cholelithiasis. No radiographic evidence of cholecystitis. Stable small paraumbilical and epigastric ventral hernias which contain only omental fat. Aortic Atherosclerosis (ICD10-I70.0). Electronically Signed   By: Marlaine Hind M.D.   On: 05/21/2020 10:36

## 2020-06-11 ENCOUNTER — Other Ambulatory Visit: Payer: Self-pay

## 2020-06-11 ENCOUNTER — Inpatient Hospital Stay (HOSPITAL_BASED_OUTPATIENT_CLINIC_OR_DEPARTMENT_OTHER): Payer: Medicaid Other | Admitting: Medical

## 2020-06-11 VITALS — BP 132/85 | HR 103 | Temp 97.0°F | Resp 18 | Ht 66.0 in | Wt 113.3 lb

## 2020-06-11 DIAGNOSIS — C3491 Malignant neoplasm of unspecified part of right bronchus or lung: Secondary | ICD-10-CM | POA: Diagnosis not present

## 2020-06-11 DIAGNOSIS — G629 Polyneuropathy, unspecified: Secondary | ICD-10-CM | POA: Diagnosis not present

## 2020-06-11 DIAGNOSIS — Z5112 Encounter for antineoplastic immunotherapy: Secondary | ICD-10-CM | POA: Diagnosis not present

## 2020-06-11 MED ORDER — GABAPENTIN 300 MG PO CAPS: 300.0000 mg | ORAL_CAPSULE | Freq: Every day | ORAL | 2 refills | Status: DC

## 2020-06-11 NOTE — Progress Notes (Signed)
[facility] Progress Note   Brenda [name] [id number] 02/01/1961 60 y.o.  Brenda [name] is managed by Dr. Fanny Bien. Brenda [name]  Actively treated with chemotherapy/immunotherapy/hormonal therapy: yes  Current therapy: Alimta and Keytruda  Last treated: 05/29/2020 (cycle #11, day #1)  Next scheduled appointment with provider: 06/18/2020  Assessment: Plan:    Adenocarcinoma of right lung, stage 4 (HCC)  Neuropathy   Metastatic adenocarcinoma of the right lung: The patient continues to be managed by Dr. Fanny Bien. Brenda [name] and is status post cycle 11, day 1 of Alimta and Keytruda which was dosed on 05/29/2020.  She is scheduled to be seen in follow-up on 06/18/2020.  Neuropathy: Brenda [name] was given a prescription for gabapentin 300 mg p.o. nightly.  Please see After Visit Summary for patient specific instructions.  Future Appointments  Date Time Provider [place]  06/11/2020 10:30 AM Harle Stanford., PA-C [facility] None  06/18/2020 10:45 AM [facility] LAB [facility] None  06/18/2020 11:00 AM [facility] [place] FLUSH [facility] None  06/18/2020 11:30 AM Heilingoetter, Cassandra L, PA-C [facility] None  06/18/2020 12:30 PM [facility] INFUSION [facility] None  07/10/2020  8:45 AM [facility] LAB [facility] None  07/10/2020  9:00 AM [facility] [place] FLUSH [facility] None  07/10/2020  9:30 AM Heilingoetter, Cassandra L, PA-C [facility] None  07/10/2020 10:00 AM [facility] INFUSION [facility] None    No orders of the defined types were placed in this encounter.      Subjective:   Patient ID:  Brenda [name] is a 60 y.o. (DOB 17-May-1960) [name].  Chief Complaint: No chief complaint on file.   HPI Brenda [name] with a diagnosis of a metastatic adenocarcinoma of the right lung.  She is followed by Dr. Julien [name] and is status post cycle 11, day 1 of Alimta and Keytruda which was dosed on 05/29/2020.  She presents to the clinic today with  a report of with a report of hyperpigmentation of her lower extremities and feet and numbness and tingling in her hands and feet.  She is noted this more over the past 2 weeks.  She reports that the numbness and tingling in her feet keep her awake at night.  Medications: I have reviewed the patient's current medications.  Allergies:  Allergies  Allergen Reactions  . Aspirin Adult Low [Aspirin] Other (See Comments)    Stomach upset    Past Medical History:  Diagnosis Date  . Breast cancer ([place])   . Depression   . Mental disorder     Past Surgical History:  Procedure Laterality Date  . ESOPHAGOGASTRODUODENOSCOPY (EGD) WITH PROPOFOL N/A 04/09/2020   Procedure: ESOPHAGOGASTRODUODENOSCOPY (EGD) WITH PROPOFOL;  Surgeon: Doran Stabler, MD;  Location: [facility];  Service: Gastroenterology;  Laterality: N/A;  . IR IMAGING GUIDED PORT INSERTION  11/03/2019  . TUBAL LIGATION    . VIDEO BRONCHOSCOPY WITH ENDOBRONCHIAL NAVIGATION N/A 04/04/2019   Procedure: VIDEO BRONCHOSCOPY WITH ENDOBRONCHIAL NAVIGATION;  Surgeon: Garner Nash, DO;  Location: [facility];  Service: Thoracic;  Laterality: N/A;  . VIDEO BRONCHOSCOPY WITH ENDOBRONCHIAL ULTRASOUND N/A 04/04/2019   Procedure: VIDEO BRONCHOSCOPY WITH ENDOBRONCHIAL ULTRASOUND;  Surgeon: Garner Nash, DO;  Location: [place];  Service: Thoracic;  Laterality: N/A;    No family history on file.  Social History   Socioeconomic History  . Marital status: Legally Separated    Spouse name: Not on file  . Number of children: Not on file  . Years of education: Not on file  .  Highest education level: Not on file  Occupational History  . Not on file  Tobacco Use  . Smoking status: Former Smoker    Packs/day: 0.50    Years: 30.00    Pack years: 15.00    Types: Cigarettes    Quit date: 05/27/2019    Years since quitting: 1.0  . Smokeless tobacco: Never Used  Vaping Use  . Vaping Use: Never used  Substance and Sexual Activity  . Alcohol use:  Yes    Alcohol/week: 84.0 standard drinks    Types: 84 Cans of beer per week  . Drug use: Yes    Types: Cocaine  . Sexual activity: Not on file  Other Topics Concern  . Not on file  Social History Narrative   ** Merged History Encounter **       Social Determinants of Health   Financial Resource Strain: Not on file  Food Insecurity: Not on file  Transportation Needs: Not on file  Physical Activity: Not on file  Stress: Not on file  Social Connections: Not on file  Intimate Partner Violence: Not on file    Past Medical History, Surgical history, Social history, and Family history were reviewed and updated as appropriate.   Please see review of systems for further details on the patient's review from today.   Review of Systems:  Review of Systems  Constitutional: Negative for chills, diaphoresis and fever.  HENT: Negative for trouble swallowing and voice change.   Respiratory: Negative for cough, chest tightness, shortness of breath and wheezing.   Cardiovascular: Negative for chest pain and palpitations.  Gastrointestinal: Negative for abdominal pain, constipation, diarrhea, nausea and vomiting.  Musculoskeletal: Negative for back pain and myalgias.  Neurological: Positive for numbness. Negative for dizziness, light-headedness and headaches.       Numbness and tingling of the toes.    Objective:   Physical Exam:  LMP 01/19/2011  ECOG: 1  Physical Exam Constitutional:      General: She is not in acute distress.    Appearance: She is not diaphoretic.  HENT:     Head: Normocephalic and atraumatic.  Eyes:     General: No scleral icterus.       Right eye: No discharge.        Left eye: No discharge.     Conjunctiva/sclera: Conjunctivae normal.  Skin:    General: Skin is warm and dry.     Findings: No erythema or rash.  Neurological:     Mental Status: She is alert.     Coordination: Coordination normal.     Gait: Gait normal.  Psychiatric:        Mood and  Affect: Mood normal.        Behavior: Behavior normal.        Thought Content: Thought content normal.        Judgment: Judgment normal.     Lab Review:     Component Value Date/Time   NA 139 05/29/2020 1118   K 3.3 (L) 05/29/2020 1118   CL 109 05/29/2020 1118   CO2 23 05/29/2020 1118   GLUCOSE 93 05/29/2020 1118   BUN 7 05/29/2020 1118   CREATININE 1.47 (H) 05/29/2020 1118   CALCIUM 8.1 (L) 05/29/2020 1118   PROT 6.5 05/29/2020 1118   ALBUMIN 2.2 (L) 05/29/2020 1118   AST 22 05/29/2020 1118   ALT 8 05/29/2020 1118   ALKPHOS 188 (H) 05/29/2020 1118   BILITOT 0.4 05/29/2020 1118   GFRNONAA 41 (  L) 05/29/2020 1118   GFRAA >60 12/19/2019 1145       Component Value Date/Time   WBC 5.0 05/29/2020 1118   WBC 7.6 04/11/2020 0525   RBC 2.02 (L) 05/29/2020 1118   HGB 8.1 (L) 05/29/2020 1118   HCT 24.1 (L) 05/29/2020 1118   PLT 293 05/29/2020 1118   MCV 119.3 (H) 05/29/2020 1118   MCH 40.1 (H) 05/29/2020 1118   MCHC 33.6 05/29/2020 1118   RDW Not Measured 05/29/2020 1118   LYMPHSABS 1.5 05/29/2020 1118   MONOABS 0.7 05/29/2020 1118   EOSABS 0.0 05/29/2020 1118   BASOSABS 0.0 05/29/2020 1118   -------------------------------  Imaging from last 24 hours (if applicable):  Radiology interpretation: CT Chest W Contrast  Result Date: 05/21/2020 CLINICAL DATA:  Primary Cancer Type: Lung Imaging Indication: Assess response to therapy Interval therapy since last imaging? Yes Initial Cancer Diagnosis Date: 08/10/2019; Established by: Biopsy-proven Detailed Pathology: Stage IV non-small cell lung cancer, adenocarcinoma. Primary Tumor location:  Right upper lobe. Surgeries: No. Chemotherapy: Yes; Ongoing? Yes; Most recent administration: 04/17/2020 Immunotherapy?  Yes; Type: Keytruda; Ongoing? Yes Radiation therapy? No EXAM: CT CHEST, ABDOMEN, AND PELVIS WITH CONTRAST TECHNIQUE: Multidetector CT imaging of the chest, abdomen and pelvis was performed following the standard protocol during  bolus administration of intravenous contrast. CONTRAST:  158mL OMNIPAQUE IOHEXOL 300 MG/ML  SOLN COMPARISON:  02/08/2020 FINDINGS: CT CHEST FINDINGS Cardiovascular: No acute findings. Mediastinum/Lymph Nodes: No masses or pathologically enlarged lymph nodes identified. Lungs/Pleura: New small left pleural effusion and dependent left lower lobe atelectasis. New tiny right pleural effusion also noted. Spiculated nodule in the inferior right upper lobe adjacent to the minor fissure has increased in size, currently measuring 2.1 x 2.0 cm image 69/6, compared to 1.5 x 1.5 cm previously. Adjacent nodule in the inferior right upper lobe measures 1.5 x 0.6 cm, without significant change. Another nodule in the medial right upper lobe abutting the posterior mediastinum is stable in size, measuring 2.2 x 2.0 cm. No new pulmonary nodules or masses identified. Musculoskeletal: No suspicious bone lesions or other significant abnormality. CT ABDOMEN AND PELVIS FINDINGS Hepatobiliary: No masses identified. Gallstones are seen, however there is no evidence of cholecystitis or biliary dilatation. Pancreas:  No mass or inflammatory changes. Spleen: Within normal limits in size and appearance. Adrenals/Urinary Tract: No masses identified. No evidence of ureteral calculi or hydronephrosis. Stomach/Bowel: No evidence of obstruction, inflammatory process or abnormal fluid collections. Aortic atherosclerotic calcification noted. Vascular/Lymphatic: No pathologically enlarged lymph nodes. No abdominal aortic aneurysm. Aortic atherosclerotic calcification noted. Reproductive:  No mass or other significant abnormality. Other: New mild ascites and diffuse mesenteric and body wall edema. Two small paraumbilical and epigastric ventral hernias are again seen which contain only omental fat. Musculoskeletal:  No suspicious bone lesions identified. IMPRESSION: Mild increase in size of 1 spiculated nodule in inferior right upper lobe. Two other right  upper lobe pulmonary nodules remains stable. No abdominal or pelvic metastatic disease identified. New small pleural effusions, mild ascites, and diffuse mesenteric and body wall edema, consistent with 3rd spacing. Cholelithiasis. No radiographic evidence of cholecystitis. Stable small paraumbilical and epigastric ventral hernias which contain only omental fat. Aortic Atherosclerosis (ICD10-I70.0). Electronically Signed   By: Marlaine Hind M.D.   On: 05/21/2020 10:36   CT Abdomen Pelvis W Contrast  Result Date: 05/21/2020 CLINICAL DATA:  Primary Cancer Type: Lung Imaging Indication: Assess response to therapy Interval therapy since last imaging? Yes Initial Cancer Diagnosis Date: 08/10/2019; Established by: Biopsy-proven Detailed Pathology:  Stage IV non-small cell lung cancer, adenocarcinoma. Primary Tumor location:  Right upper lobe. Surgeries: No. Chemotherapy: Yes; Ongoing? Yes; Most recent administration: 04/17/2020 Immunotherapy?  Yes; Type: Keytruda; Ongoing? Yes Radiation therapy? No EXAM: CT CHEST, ABDOMEN, AND PELVIS WITH CONTRAST TECHNIQUE: Multidetector CT imaging of the chest, abdomen and pelvis was performed following the standard protocol during bolus administration of intravenous contrast. CONTRAST:  135mL OMNIPAQUE IOHEXOL 300 MG/ML  SOLN COMPARISON:  02/08/2020 FINDINGS: CT CHEST FINDINGS Cardiovascular: No acute findings. Mediastinum/Lymph Nodes: No masses or pathologically enlarged lymph nodes identified. Lungs/Pleura: New small left pleural effusion and dependent left lower lobe atelectasis. New tiny right pleural effusion also noted. Spiculated nodule in the inferior right upper lobe adjacent to the minor fissure has increased in size, currently measuring 2.1 x 2.0 cm image 69/6, compared to 1.5 x 1.5 cm previously. Adjacent nodule in the inferior right upper lobe measures 1.5 x 0.6 cm, without significant change. Another nodule in the medial right upper lobe abutting the posterior mediastinum  is stable in size, measuring 2.2 x 2.0 cm. No new pulmonary nodules or masses identified. Musculoskeletal: No suspicious bone lesions or other significant abnormality. CT ABDOMEN AND PELVIS FINDINGS Hepatobiliary: No masses identified. Gallstones are seen, however there is no evidence of cholecystitis or biliary dilatation. Pancreas:  No mass or inflammatory changes. Spleen: Within normal limits in size and appearance. Adrenals/Urinary Tract: No masses identified. No evidence of ureteral calculi or hydronephrosis. Stomach/Bowel: No evidence of obstruction, inflammatory process or abnormal fluid collections. Aortic atherosclerotic calcification noted. Vascular/Lymphatic: No pathologically enlarged lymph nodes. No abdominal aortic aneurysm. Aortic atherosclerotic calcification noted. Reproductive:  No mass or other significant abnormality. Other: New mild ascites and diffuse mesenteric and body wall edema. Two small paraumbilical and epigastric ventral hernias are again seen which contain only omental fat. Musculoskeletal:  No suspicious bone lesions identified. IMPRESSION: Mild increase in size of 1 spiculated nodule in inferior right upper lobe. Two other right upper lobe pulmonary nodules remains stable. No abdominal or pelvic metastatic disease identified. New small pleural effusions, mild ascites, and diffuse mesenteric and body wall edema, consistent with 3rd spacing. Cholelithiasis. No radiographic evidence of cholecystitis. Stable small paraumbilical and epigastric ventral hernias which contain only omental fat. Aortic Atherosclerosis (ICD10-I70.0). Electronically Signed   By: Marlaine Hind M.D.   On: 05/21/2020 10:36        This case was discussed with Dr. Julien [name]. He expressed agreement with my management of this patient.

## 2020-06-15 NOTE — Progress Notes (Signed)
[facility] OFFICE PROGRESS NOTE  Pcp, No No address on file  DIAGNOSIS: Stage IV (T2 a, N2, M1 B) non-small cell lung cancer, adenocarcinoma presented with large right upper lobe lung mass in addition to suspicious right paratracheal lymphadenopathy and bilateral pulmonary nodules as well as retroperitoneal lymphadenopathy diagnosed in May 2021. The patient has no actionable mutations and has negative PD-L1 expression on molecular studies by foundation 1.  PRIOR THERAPY: None  CURRENT THERAPY: Systemic chemotherapy with carboplatin for an AUC of 5, Alimta 500 mg/m2, and Keytruda 200 mg IV every 3 weeks. First dose 09/28/2019. Status post11cycles.Starting from cycle #5 she has been on maintenance alimta and [place].She is on a reduced dose of alimta due to neutropenia. Alimta occasionally held due to renal insuffiencey.   INTERVAL HISTORY: Brenda [name] 60 y.o. female returns to clinic today for follow-up visit.  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 She was started on gabapentin.  At the patient's last appointment, she was endorsing improved but persistent lower extremity swelling.  Her restaging CT scan also showed evidence of third spacing.  We discussed this was likely secondary to her hypoalbuminemia.  She was instructed to decrease her intake of salty foods, increase her protein intake, and use compression stockings. Her swelling is improved but she has some persistent achiness.   Otherwise, at her last appointment her creatinine was slightly elevated and she underwent treatment with single agent Keytruda at her last appointment.  Otherwise today she is feeling fair she denies any fever, chills, or night sweats.  Her weight is stable. She denies any chest pain, significant cough, or hemoptysis.  She reports her  baseline dyspnea on exertion especially with climbing the stairs.  She denies any diarrhea or constipation nausea and vomiting?  She denies any headaches. She denies any rashes or skin changes except for lower extremity discoloration/darkening from her swelling and on her arm which may from her alimta.  The patient is here today for evaluation before starting cycle #12 treatment.       MEDICAL HISTORY: Past Medical History:  Diagnosis Date  . Breast cancer ([place])   . Depression   . Mental disorder     ALLERGIES:  is allergic to aspirin adult low [aspirin].  MEDICATIONS:  Current Outpatient Medications  Medication Sig Dispense Refill  . acetaminophen (TYLENOL) 325 MG tablet Take 2 tablets (650 mg total) by mouth every 6 (six) hours as needed for mild pain, moderate pain or headache.    . ferrous sulfate (FERROUSUL) 325 (65 FE) MG tablet Take 1 tablet (325 mg total) by mouth 2 (two) times daily with a meal. 60 tablet 1  . folic acid (FOLVITE) 1 MG tablet Take 1 tablet (1 mg total) by mouth daily. 30 tablet 2  . gabapentin (NEURONTIN) 300 MG capsule Take 1 capsule (300 mg total) by mouth at bedtime. 30 capsule 2  . lidocaine-prilocaine (EMLA) cream Apply 1 application topically as needed. 30 g 2  . pantoprazole (PROTONIX) 40 MG tablet Take 1 tablet (40 mg total) by mouth 2 (two) times daily. 60 tablet 0  . potassium chloride SA (KLOR-CON) 20 MEQ tablet Take 1 tablet (20 mEq total) by mouth once for 1 dose. 7 tablet 0   No current facility-administered medications for this visit.    SURGICAL HISTORY:  Past Surgical History:  Procedure Laterality Date  . ESOPHAGOGASTRODUODENOSCOPY (EGD) WITH PROPOFOL N/A 04/09/2020   Procedure: ESOPHAGOGASTRODUODENOSCOPY (EGD) WITH PROPOFOL;  Surgeon: Doran Stabler, MD;  Location: [facility];  Service: Gastroenterology;  Laterality: N/A;  . IR IMAGING GUIDED PORT INSERTION  11/03/2019  . TUBAL LIGATION    . VIDEO BRONCHOSCOPY WITH ENDOBRONCHIAL  NAVIGATION N/A 04/04/2019   Procedure: VIDEO BRONCHOSCOPY WITH ENDOBRONCHIAL NAVIGATION;  Surgeon: Garner Nash, DO;  Location: [facility];  Service: Thoracic;  Laterality: N/A;  . VIDEO BRONCHOSCOPY WITH ENDOBRONCHIAL ULTRASOUND N/A 04/04/2019   Procedure: VIDEO BRONCHOSCOPY WITH ENDOBRONCHIAL ULTRASOUND;  Surgeon: Garner Nash, DO;  Location: [facility];  Service: Thoracic;  Laterality: N/A;    REVIEW OF SYSTEMS:   Review of Systems  Constitutional: Negative for appetite change, chills, fatigue, fever and unexpected weight change.  HENT: Negative for mouth sores, nosebleeds, sore throat and trouble swallowing.   Eyes: Negative for eye problems and icterus.  Respiratory: Positive for baseline mild dyspnea on exertion. Negative for cough, hemoptysis, and wheezing.   Cardiovascular: Negative for chest pain or leg swelling..  Gastrointestinal:  Negative for abdominal pain, constipation, diarrhea, and vomiting.  Genitourinary: Negative for bladder incontinence, difficulty urinating, dysuria, frequency and hematuria.   Musculoskeletal: Negative for back pain, gait problem, neck pain and neck stiffness.  Skin: Positive for skin darkening on her lower extremities and on right forearm. Negative for itching and rash.  Neurological: Positive for numbness in right hand. Negative for dizziness, extremity weakness, gait problem, headaches, light-headedness and seizures.  Hematological: Negative for adenopathy. Does not bruise/bleed easily.  Psychiatric/Behavioral: Negative for confusion, depression and sleep disturbance. The patient is not nervous/anxious.     PHYSICAL EXAMINATION:  Blood pressure (!) 127/91, pulse (!) 106, temperature (!) 96.8 F (36 C), temperature source Tympanic, resp. rate 18, weight 114 lb 11.2 oz (52 kg), last menstrual period 01/19/2011.  ECOG PERFORMANCE STATUS: 1 - Symptomatic but completely ambulatory  Physical Exam  Constitutional: Oriented to person, place, and time and  cachetic appearing female in no acute distress. HENT:  Head: Normocephalic and atraumatic.  Mouth/Throat: Oropharynx is clear and moist. No oropharyngeal exudate.  Eyes: Conjunctivae are normal. Right eye exhibits no discharge. Left eye exhibits no discharge. No scleral icterus.  Neck: Normal range of motion. Neck supple.  Cardiovascular: Normal rate, regular rhythm, normal heart sounds and intact distal pulses.   Pulmonary/Chest: Effort normal and breath sounds normal. No respiratory distress. No wheezes. No rales.  Abdominal: Epigastric hernia noted. Soft. Bowel sounds are normal. Exhibits no distension and no mass. There is no tenderness.  Musculoskeletal: Normal range of motion or edema.   Lymphadenopathy:    No cervical adenopathy.  Neurological: Alert and oriented to person, place, and time. Exhibits normal muscle tone. Gait normal. Coordination normal.  Skin: Some skin darkening on lower extremities and on right forearm. Skin is warm and dry. No rash noted. Not diaphoretic. No erythema. No pallor.  Psychiatric: Mood, memory and judgment normal.  Vitals reviewed.  LABORATORY DATA: Lab Results  Component Value Date   WBC 4.5 06/18/2020   HGB 9.4 (L) 06/18/2020   HCT 28.5 (L) 06/18/2020   MCV 117.8 (H) 06/18/2020   PLT 248 06/18/2020      Chemistry      Component Value Date/Time   NA 140 06/18/2020 1126   K 3.6 06/18/2020 1126   CL 109 06/18/2020 1126   CO2 22 06/18/2020 1126   BUN 11 06/18/2020 1126   CREATININE 1.33 (H)  06/18/2020 1126      Component Value Date/Time   CALCIUM 8.5 (L) 06/18/2020 1126   ALKPHOS 187 (H) 06/18/2020 1126   AST 22 06/18/2020 1126   ALT 10 06/18/2020 1126   BILITOT 0.3 06/18/2020 1126       RADIOGRAPHIC STUDIES:  CT Chest W Contrast  Result Date: 05/21/2020 CLINICAL DATA:  Primary Cancer Type: Lung Imaging Indication: Assess response to therapy Interval therapy since last imaging? Yes Initial Cancer Diagnosis Date: 08/10/2019;  Established by: Biopsy-proven Detailed Pathology: Stage IV non-small cell lung cancer, adenocarcinoma. Primary Tumor location:  Right upper lobe. Surgeries: No. Chemotherapy: Yes; Ongoing? Yes; Most recent administration: 04/17/2020 Immunotherapy?  Yes; Type: Keytruda; Ongoing? Yes Radiation therapy? No EXAM: CT CHEST, ABDOMEN, AND PELVIS WITH CONTRAST TECHNIQUE: Multidetector CT imaging of the chest, abdomen and pelvis was performed following the standard protocol during bolus administration of intravenous contrast. CONTRAST:  164m OMNIPAQUE IOHEXOL 300 MG/ML  SOLN COMPARISON:  02/08/2020 FINDINGS: CT CHEST FINDINGS Cardiovascular: No acute findings. Mediastinum/Lymph Nodes: No masses or pathologically enlarged lymph nodes identified. Lungs/Pleura: New small left pleural effusion and dependent left lower lobe atelectasis. New tiny right pleural effusion also noted. Spiculated nodule in the inferior right upper lobe adjacent to the minor fissure has increased in size, currently measuring 2.1 x 2.0 cm image 69/6, compared to 1.5 x 1.5 cm previously. Adjacent nodule in the inferior right upper lobe measures 1.5 x 0.6 cm, without significant change. Another nodule in the medial right upper lobe abutting the posterior mediastinum is stable in size, measuring 2.2 x 2.0 cm. No new pulmonary nodules or masses identified. Musculoskeletal: No suspicious bone lesions or other significant abnormality. CT ABDOMEN AND PELVIS FINDINGS Hepatobiliary: No masses identified. Gallstones are seen, however there is no evidence of cholecystitis or biliary dilatation. Pancreas:  No mass or inflammatory changes. Spleen: Within normal limits in size and appearance. Adrenals/Urinary Tract: No masses identified. No evidence of ureteral calculi or hydronephrosis. Stomach/Bowel: No evidence of obstruction, inflammatory process or abnormal fluid collections. Aortic atherosclerotic calcification noted. Vascular/Lymphatic: No pathologically  enlarged lymph nodes. No abdominal aortic aneurysm. Aortic atherosclerotic calcification noted. Reproductive:  No mass or other significant abnormality. Other: New mild ascites and diffuse mesenteric and body wall edema. Two small paraumbilical and epigastric ventral hernias are again seen which contain only omental fat. Musculoskeletal:  No suspicious bone lesions identified. IMPRESSION: Mild increase in size of 1 spiculated nodule in inferior right upper lobe. Two other right upper lobe pulmonary nodules remains stable. No abdominal or pelvic metastatic disease identified. New small pleural effusions, mild ascites, and diffuse mesenteric and body wall edema, consistent with 3rd spacing. Cholelithiasis. No radiographic evidence of cholecystitis. Stable small paraumbilical and epigastric ventral hernias which contain only omental fat. Aortic Atherosclerosis (ICD10-I70.0). Electronically Signed   By: JMarlaine HindM.D.   On: 05/21/2020 10:36   CT Abdomen Pelvis W Contrast  Result Date: 05/21/2020 CLINICAL DATA:  Primary Cancer Type: Lung Imaging Indication: Assess response to therapy Interval therapy since last imaging? Yes Initial Cancer Diagnosis Date: 08/10/2019; Established by: Biopsy-proven Detailed Pathology: Stage IV non-small cell lung cancer, adenocarcinoma. Primary Tumor location:  Right upper lobe. Surgeries: No. Chemotherapy: Yes; Ongoing? Yes; Most recent administration: 04/17/2020 Immunotherapy?  Yes; Type: Keytruda; Ongoing? Yes Radiation therapy? No EXAM: CT CHEST, ABDOMEN, AND PELVIS WITH CONTRAST TECHNIQUE: Multidetector CT imaging of the chest, abdomen and pelvis was performed following the standard protocol during bolus administration of intravenous contrast. CONTRAST:  1030mOMNIPAQUE IOHEXOL 300 MG/ML  SOLN COMPARISON:  02/08/2020 FINDINGS: CT CHEST FINDINGS Cardiovascular: No acute findings. Mediastinum/Lymph Nodes: No masses or pathologically enlarged lymph nodes identified. Lungs/Pleura:  New small left pleural effusion and dependent left lower lobe atelectasis. New tiny right pleural effusion also noted. Spiculated nodule in the inferior right upper lobe adjacent to the minor fissure has increased in size, currently measuring 2.1 x 2.0 cm image 69/6, compared to 1.5 x 1.5 cm previously. Adjacent nodule in the inferior right upper lobe measures 1.5 x 0.6 cm, without significant change. Another nodule in the medial right upper lobe abutting the posterior mediastinum is stable in size, measuring 2.2 x 2.0 cm. No new pulmonary nodules or masses identified. Musculoskeletal: No suspicious bone lesions or other significant abnormality. CT ABDOMEN AND PELVIS FINDINGS Hepatobiliary: No masses identified. Gallstones are seen, however there is no evidence of cholecystitis or biliary dilatation. Pancreas:  No mass or inflammatory changes. Spleen: Within normal limits in size and appearance. Adrenals/Urinary Tract: No masses identified. No evidence of ureteral calculi or hydronephrosis. Stomach/Bowel: No evidence of obstruction, inflammatory process or abnormal fluid collections. Aortic atherosclerotic calcification noted. Vascular/Lymphatic: No pathologically enlarged lymph nodes. No abdominal aortic aneurysm. Aortic atherosclerotic calcification noted. Reproductive:  No mass or other significant abnormality. Other: New mild ascites and diffuse mesenteric and body wall edema. Two small paraumbilical and epigastric ventral hernias are again seen which contain only omental fat. Musculoskeletal:  No suspicious bone lesions identified. IMPRESSION: Mild increase in size of 1 spiculated nodule in inferior right upper lobe. Two other right upper lobe pulmonary nodules remains stable. No abdominal or pelvic metastatic disease identified. New small pleural effusions, mild ascites, and diffuse mesenteric and body wall edema, consistent with 3rd spacing. Cholelithiasis. No radiographic evidence of cholecystitis. Stable  small paraumbilical and epigastric ventral hernias which contain only omental fat. Aortic Atherosclerosis (ICD10-I70.0). Electronically Signed   By: Marlaine Hind M.D.   On: 05/21/2020 10:36     ASSESSMENT/PLAN:  This is a very pleasant 60 year old African-American female recently diagnosed with a stage IV (T2 a, N2, M1 B) non-small cell lung cancer, adenocarcinoma presented with large right upper lobe lung mass in addition to suspicious right paratracheal lymphadenopathy and bilateral pulmonary nodules as well as retroperitoneal lymphadenopathy diagnosed in May 2021. The patient has no actionable mutations and has negative PD-L1 expression on molecular studies by foundation 1.  The patient is currently undergoing palliative systemic chemotherapy with carboplatin for AUC of 5, Alimta 500 mg/M2 and Keytruda 200 mg IV every 3 weeks. She is status post11cycles.Starting from cycle #5, she has been on maintenance alimta and Keytruda.Her dose of alimta was reduced due to neutropenia. Alimta was held from cycle #11 due to renal insufficiency.    Labs reviewed, her creatinine is 1.33 today.  She will proceed with treatment today with cycle #12 with alimta as well as [place].   We will see her back for follow-up visit in 3 weeks for evaluation before starting cycle #13.  The patient does not have a PCP. I have referred her to the internal medicine residency clinic to establish care.   She will continue taking gabapentin for her neuropathy.   Discussed her skin darkening may be due to alimta. Denies pain, itching, etc.   The patient was advised to call immediately if she has any concerning symptoms in the interval. The patient voices understanding of current disease status and treatment options and is in agreement with the current care plan. All questions were answered. The patient  knows to call the clinic with any problems, questions or concerns. We can certainly see the patient much sooner if  necessary       Orders Placed This Encounter  Procedures  . TSH    Standing Status:   Standing    Number of Occurrences:   10    Standing Expiration Date:   06/18/2021  . Ambulatory referral to Internal Medicine    Referral Priority:   Routine    Referral Type:   Consultation    Referral Reason:   Specialty Services Required    Requested Specialty:   Internal Medicine    Number of Visits Requested:   1     I spent 20-29 minutes in this encounter.    L , PA-C 06/18/20

## 2020-06-18 ENCOUNTER — Other Ambulatory Visit: Payer: Self-pay

## 2020-06-18 ENCOUNTER — Inpatient Hospital Stay: Payer: Medicaid Other

## 2020-06-18 ENCOUNTER — Inpatient Hospital Stay (HOSPITAL_BASED_OUTPATIENT_CLINIC_OR_DEPARTMENT_OTHER): Payer: Medicaid Other | Admitting: Physician Assistant

## 2020-06-18 ENCOUNTER — Encounter: Payer: Self-pay | Admitting: Physician Assistant

## 2020-06-18 VITALS — HR 95

## 2020-06-18 VITALS — BP 127/91 | HR 106 | Temp 96.8°F | Resp 18 | Wt 114.7 lb

## 2020-06-18 DIAGNOSIS — C3491 Malignant neoplasm of unspecified part of right bronchus or lung: Secondary | ICD-10-CM | POA: Diagnosis not present

## 2020-06-18 DIAGNOSIS — Z5112 Encounter for antineoplastic immunotherapy: Secondary | ICD-10-CM

## 2020-06-18 DIAGNOSIS — D709 Neutropenia, unspecified: Secondary | ICD-10-CM

## 2020-06-18 LAB — CMP (CANCER CENTER ONLY)
ALT: 10 U/L (ref 0–44)
AST: 22 U/L (ref 15–41)
Albumin: 2.5 g/dL — ABNORMAL LOW (ref 3.5–5.0)
Alkaline Phosphatase: 187 U/L — ABNORMAL HIGH (ref 38–126)
Anion gap: 9 (ref 5–15)
BUN: 11 mg/dL (ref 6–20)
CO2: 22 mmol/L (ref 22–32)
Calcium: 8.5 mg/dL — ABNORMAL LOW (ref 8.9–10.3)
Chloride: 109 mmol/L (ref 98–111)
Creatinine: 1.33 mg/dL — ABNORMAL HIGH (ref 0.44–1.00)
GFR, Estimated: 47 mL/min — ABNORMAL LOW (ref >60)
Glucose, Bld: 71 mg/dL (ref 70–99)
Potassium: 3.6 mmol/L (ref 3.5–5.1)
Sodium: 140 mmol/L (ref 135–145)
Total Bilirubin: 0.3 mg/dL (ref 0.3–1.2)
Total Protein: 7 g/dL (ref 6.5–8.1)

## 2020-06-18 LAB — CBC WITH DIFFERENTIAL (CANCER CENTER ONLY)
Abs Immature Granulocytes: 0.01 10*3/uL (ref 0.00–0.07)
Basophils Absolute: 0 10*3/uL (ref 0.0–0.1)
Basophils Relative: 0 %
Eosinophils Absolute: 0 10*3/uL (ref 0.0–0.5)
Eosinophils Relative: 0 %
HCT: 28.5 % — ABNORMAL LOW (ref 36.0–46.0)
Hemoglobin: 9.4 g/dL — ABNORMAL LOW (ref 12.0–15.0)
Immature Granulocytes: 0 %
Lymphocytes Relative: 30 %
Lymphs Abs: 1.3 10*3/uL (ref 0.7–4.0)
MCH: 38.8 pg — ABNORMAL HIGH (ref 26.0–34.0)
MCHC: 33 g/dL (ref 30.0–36.0)
MCV: 117.8 fL — ABNORMAL HIGH (ref 80.0–100.0)
Monocytes Absolute: 0.6 10*3/uL (ref 0.1–1.0)
Monocytes Relative: 13 %
Neutro Abs: 2.6 10*3/uL (ref 1.7–7.7)
Neutrophils Relative %: 57 %
Platelet Count: 248 10*3/uL (ref 150–400)
RBC: 2.42 MIL/uL — ABNORMAL LOW (ref 3.87–5.11)
RDW: 13.9 % (ref 11.5–15.5)
WBC Count: 4.5 10*3/uL (ref 4.0–10.5)
nRBC: 0 % (ref 0.0–0.2)

## 2020-06-18 LAB — TSH: TSH: 2.67 u[IU]/mL (ref 0.308–3.960)

## 2020-06-18 LAB — SAMPLE TO BLOOD BANK

## 2020-06-18 MED ORDER — PROCHLORPERAZINE MALEATE 10 MG PO TABS
10.0000 mg | ORAL_TABLET | Freq: Once | ORAL | Status: AC
  Administered 2020-06-18: 10 mg via ORAL

## 2020-06-18 MED ORDER — SODIUM CHLORIDE 0.9 % IV SOLN
400.0000 mg/m2 | Freq: Once | INTRAVENOUS | Status: AC
  Administered 2020-06-18: 600 mg via INTRAVENOUS
  Filled 2020-06-18: qty 4

## 2020-06-18 MED ORDER — PROCHLORPERAZINE MALEATE 10 MG PO TABS
ORAL_TABLET | ORAL | Status: AC
  Filled 2020-06-18: qty 1

## 2020-06-18 MED ORDER — SODIUM CHLORIDE 0.9 % IV SOLN
200.0000 mg | Freq: Once | INTRAVENOUS | Status: AC
  Administered 2020-06-18: 200 mg via INTRAVENOUS
  Filled 2020-06-18: qty 8

## 2020-06-18 MED ORDER — SODIUM CHLORIDE 0.9 % IV SOLN
Freq: Once | INTRAVENOUS | Status: AC
  Filled 2020-06-18: qty 250

## 2020-06-18 MED ORDER — SODIUM CHLORIDE 0.9% FLUSH
10.0000 mL | INTRAVENOUS | Status: DC | PRN
  Administered 2020-06-18: 10 mL
  Filled 2020-06-18: qty 10

## 2020-06-18 MED ORDER — SODIUM CHLORIDE 0.9% FLUSH
10.0000 mL | Freq: Once | INTRAVENOUS | Status: AC
  Administered 2020-06-18: 10 mL
  Filled 2020-06-18: qty 10

## 2020-06-18 MED ORDER — HEPARIN SOD (PORK) LOCK FLUSH 100 UNIT/ML IV SOLN
500.0000 [IU] | Freq: Once | INTRAVENOUS | Status: AC | PRN
  Administered 2020-06-18: 500 [IU]
  Filled 2020-06-18: qty 5

## 2020-06-18 NOTE — Patient Instructions (Signed)
[facility] Discharge Instructions for Patients Receiving Chemotherapy  Today you received the following chemotherapy agents: Keytruda, Alimta  To help prevent nausea and vomiting after your treatment, we encourage you to take your nausea medication as directed.    If you develop nausea and vomiting that is not controlled by your nausea medication, call the clinic.   BELOW ARE SYMPTOMS THAT SHOULD BE REPORTED IMMEDIATELY:  *FEVER GREATER THAN 100.5 F  *CHILLS WITH OR WITHOUT FEVER  NAUSEA AND VOMITING THAT IS NOT CONTROLLED WITH YOUR NAUSEA MEDICATION  *UNUSUAL SHORTNESS OF BREATH  *UNUSUAL BRUISING OR BLEEDING  TENDERNESS IN MOUTH AND THROAT WITH OR WITHOUT PRESENCE OF ULCERS  *URINARY PROBLEMS  *BOWEL PROBLEMS  UNUSUAL RASH Items with * indicate a potential emergency and should be followed up as soon as possible.  Feel free to call the clinic should you have any questions or concerns. The clinic phone number is [phone].  Please show the [facility] at check-in to the Emergency Department and triage nurse.

## 2020-06-19 ENCOUNTER — Telehealth: Payer: Self-pay | Admitting: Physician Assistant

## 2020-06-19 NOTE — Telephone Encounter (Signed)
Scheduled per los. Called and spoke with patient. Confirmed appts  

## 2020-06-24 ENCOUNTER — Telehealth: Payer: Self-pay

## 2020-06-24 NOTE — Telephone Encounter (Signed)
Pt called stating she needs a rx for insomnia. Pt was advised to contact her PCP for this. Pt states she does not have a PCP and was therefore advised to contact her insurance company for a listing of available PCP and she can also review the [employer] as well to determine what providers are available near her. Pt expressed understanding.

## 2020-07-01 ENCOUNTER — Other Ambulatory Visit: Payer: Self-pay

## 2020-07-01 ENCOUNTER — Emergency Department (HOSPITAL_COMMUNITY): Payer: Medicaid Other

## 2020-07-01 ENCOUNTER — Inpatient Hospital Stay (HOSPITAL_COMMUNITY): Payer: Medicaid Other

## 2020-07-01 ENCOUNTER — Inpatient Hospital Stay (HOSPITAL_COMMUNITY)
Admission: EM | Admit: 2020-07-01 | Discharge: 2020-08-02 | DRG: 314 | Disposition: A | Discharge status: Home / Self Care | Payer: Medicaid Other | Attending: Family Medicine | Admitting: Family Medicine

## 2020-07-01 DIAGNOSIS — I82612 Acute embolism and thrombosis of superficial veins of left upper extremity: Secondary | ICD-10-CM | POA: Diagnosis not present

## 2020-07-01 DIAGNOSIS — T827XXA Infection and inflammatory reaction due to other cardiac and vascular devices, implants and grafts, initial encounter: Secondary | ICD-10-CM | POA: Diagnosis not present

## 2020-07-01 DIAGNOSIS — E861 Hypovolemia: Secondary | ICD-10-CM | POA: Diagnosis present

## 2020-07-01 DIAGNOSIS — R531 Weakness: Secondary | ICD-10-CM | POA: Diagnosis not present

## 2020-07-01 DIAGNOSIS — A411 Sepsis due to other specified staphylococcus: Secondary | ICD-10-CM | POA: Diagnosis present

## 2020-07-01 DIAGNOSIS — B9561 Methicillin susceptible Staphylococcus aureus infection as the cause of diseases classified elsewhere: Secondary | ICD-10-CM | POA: Diagnosis not present

## 2020-07-01 DIAGNOSIS — I129 Hypertensive chronic kidney disease with stage 1 through stage 4 chronic kidney disease, or unspecified chronic kidney disease: Secondary | ICD-10-CM | POA: Diagnosis present

## 2020-07-01 DIAGNOSIS — C3491 Malignant neoplasm of unspecified part of right bronchus or lung: Secondary | ICD-10-CM | POA: Diagnosis present

## 2020-07-01 DIAGNOSIS — D62 Acute posthemorrhagic anemia: Secondary | ICD-10-CM | POA: Diagnosis present

## 2020-07-01 DIAGNOSIS — E876 Hypokalemia: Secondary | ICD-10-CM | POA: Diagnosis not present

## 2020-07-01 DIAGNOSIS — F1410 Cocaine abuse, uncomplicated: Secondary | ICD-10-CM | POA: Diagnosis present

## 2020-07-01 DIAGNOSIS — Z20822 Contact with and (suspected) exposure to covid-19: Secondary | ICD-10-CM | POA: Diagnosis present

## 2020-07-01 DIAGNOSIS — R680 Hypothermia, not associated with low environmental temperature: Secondary | ICD-10-CM | POA: Diagnosis present

## 2020-07-01 DIAGNOSIS — N19 Unspecified kidney failure: Secondary | ICD-10-CM | POA: Diagnosis present

## 2020-07-01 DIAGNOSIS — R7881 Bacteremia: Secondary | ICD-10-CM

## 2020-07-01 DIAGNOSIS — F1010 Alcohol abuse, uncomplicated: Secondary | ICD-10-CM | POA: Diagnosis present

## 2020-07-01 DIAGNOSIS — C3490 Malignant neoplasm of unspecified part of unspecified bronchus or lung: Secondary | ICD-10-CM | POA: Diagnosis not present

## 2020-07-01 DIAGNOSIS — D649 Anemia, unspecified: Secondary | ICD-10-CM | POA: Diagnosis present

## 2020-07-01 DIAGNOSIS — T80211A Bloodstream infection due to central venous catheter, initial encounter: Secondary | ICD-10-CM | POA: Diagnosis present

## 2020-07-01 DIAGNOSIS — D61810 Antineoplastic chemotherapy induced pancytopenia: Secondary | ICD-10-CM | POA: Diagnosis present

## 2020-07-01 DIAGNOSIS — T451X5A Adverse effect of antineoplastic and immunosuppressive drugs, initial encounter: Secondary | ICD-10-CM | POA: Diagnosis present

## 2020-07-01 DIAGNOSIS — K922 Gastrointestinal hemorrhage, unspecified: Secondary | ICD-10-CM | POA: Diagnosis present

## 2020-07-01 DIAGNOSIS — R040 Epistaxis: Secondary | ICD-10-CM | POA: Diagnosis present

## 2020-07-01 DIAGNOSIS — D61818 Other pancytopenia: Secondary | ICD-10-CM | POA: Diagnosis present

## 2020-07-01 DIAGNOSIS — Z853 Personal history of malignant neoplasm of breast: Secondary | ICD-10-CM

## 2020-07-01 DIAGNOSIS — Z5111 Encounter for antineoplastic chemotherapy: Secondary | ICD-10-CM

## 2020-07-01 DIAGNOSIS — D61811 Other drug-induced pancytopenia: Secondary | ICD-10-CM | POA: Diagnosis not present

## 2020-07-01 DIAGNOSIS — N184 Chronic kidney disease, stage 4 (severe): Secondary | ICD-10-CM | POA: Diagnosis present

## 2020-07-01 DIAGNOSIS — C3411 Malignant neoplasm of upper lobe, right bronchus or lung: Secondary | ICD-10-CM | POA: Diagnosis not present

## 2020-07-01 DIAGNOSIS — R0600 Dyspnea, unspecified: Secondary | ICD-10-CM

## 2020-07-01 DIAGNOSIS — E872 Acidosis, unspecified: Secondary | ICD-10-CM | POA: Insufficient documentation

## 2020-07-01 DIAGNOSIS — Z79899 Other long term (current) drug therapy: Secondary | ICD-10-CM | POA: Diagnosis not present

## 2020-07-01 DIAGNOSIS — R609 Edema, unspecified: Secondary | ICD-10-CM | POA: Diagnosis not present

## 2020-07-01 DIAGNOSIS — N179 Acute kidney failure, unspecified: Secondary | ICD-10-CM | POA: Diagnosis present

## 2020-07-01 DIAGNOSIS — T827XXD Infection and inflammatory reaction due to other cardiac and vascular devices, implants and grafts, subsequent encounter: Secondary | ICD-10-CM | POA: Diagnosis not present

## 2020-07-01 DIAGNOSIS — D509 Iron deficiency anemia, unspecified: Secondary | ICD-10-CM | POA: Diagnosis present

## 2020-07-01 DIAGNOSIS — R569 Unspecified convulsions: Secondary | ICD-10-CM | POA: Diagnosis present

## 2020-07-01 DIAGNOSIS — T380X5A Adverse effect of glucocorticoids and synthetic analogues, initial encounter: Secondary | ICD-10-CM | POA: Diagnosis not present

## 2020-07-01 DIAGNOSIS — Z87891 Personal history of nicotine dependence: Secondary | ICD-10-CM

## 2020-07-01 DIAGNOSIS — Z886 Allergy status to analgesic agent status: Secondary | ICD-10-CM

## 2020-07-01 DIAGNOSIS — K921 Melena: Secondary | ICD-10-CM | POA: Diagnosis not present

## 2020-07-01 DIAGNOSIS — Z515 Encounter for palliative care: Secondary | ICD-10-CM | POA: Diagnosis not present

## 2020-07-01 DIAGNOSIS — D84821 Immunodeficiency due to drugs: Secondary | ICD-10-CM | POA: Diagnosis not present

## 2020-07-01 DIAGNOSIS — E1122 Type 2 diabetes mellitus with diabetic chronic kidney disease: Secondary | ICD-10-CM | POA: Diagnosis present

## 2020-07-01 DIAGNOSIS — E1165 Type 2 diabetes mellitus with hyperglycemia: Secondary | ICD-10-CM | POA: Diagnosis not present

## 2020-07-01 DIAGNOSIS — D693 Immune thrombocytopenic purpura: Secondary | ICD-10-CM | POA: Diagnosis present

## 2020-07-01 DIAGNOSIS — Z7189 Other specified counseling: Secondary | ICD-10-CM | POA: Diagnosis not present

## 2020-07-01 DIAGNOSIS — B957 Other staphylococcus as the cause of diseases classified elsewhere: Secondary | ICD-10-CM | POA: Diagnosis not present

## 2020-07-01 DIAGNOSIS — G4089 Other seizures: Secondary | ICD-10-CM | POA: Diagnosis not present

## 2020-07-01 DIAGNOSIS — N170 Acute kidney failure with tubular necrosis: Secondary | ICD-10-CM | POA: Diagnosis present

## 2020-07-01 DIAGNOSIS — E869 Volume depletion, unspecified: Secondary | ICD-10-CM | POA: Diagnosis not present

## 2020-07-01 DIAGNOSIS — D708 Other neutropenia: Secondary | ICD-10-CM | POA: Diagnosis not present

## 2020-07-01 LAB — COMPREHENSIVE METABOLIC PANEL
ALT: 32 U/L (ref 0–44)
AST: 68 U/L — ABNORMAL HIGH (ref 15–41)
Albumin: 1.4 g/dL — ABNORMAL LOW (ref 3.5–5.0)
Alkaline Phosphatase: 62 U/L (ref 38–126)
BUN: 91 mg/dL — ABNORMAL HIGH (ref 6–20)
CO2: <7 mmol/L — ABNORMAL LOW (ref 22–32)
Calcium: 6.7 mg/dL — ABNORMAL LOW (ref 8.9–10.3)
Chloride: 104 mmol/L (ref 98–111)
Creatinine, Ser: 5.56 mg/dL — ABNORMAL HIGH (ref 0.44–1.00)
GFR, Estimated: 8 mL/min — ABNORMAL LOW (ref >60)
Glucose, Bld: 190 mg/dL — ABNORMAL HIGH (ref 70–99)
Potassium: 3.9 mmol/L (ref 3.5–5.1)
Sodium: 133 mmol/L — ABNORMAL LOW (ref 135–145)
Total Bilirubin: 0.5 mg/dL (ref 0.3–1.2)
Total Protein: 4.4 g/dL — ABNORMAL LOW (ref 6.5–8.1)

## 2020-07-01 LAB — URINALYSIS, ROUTINE W REFLEX MICROSCOPIC
Bilirubin Urine: NEGATIVE
Glucose, UA: 50 mg/dL — AB
Ketones, ur: NEGATIVE mg/dL
Leukocytes,Ua: NEGATIVE
Nitrite: NEGATIVE
Protein, ur: 100 mg/dL — AB
RBC / HPF: >50 RBC/hpf — ABNORMAL HIGH (ref 0–5)
Specific Gravity, Urine: 1.008 (ref 1.005–1.030)
pH: 5 (ref 5.0–8.0)

## 2020-07-01 LAB — RAPID URINE DRUG SCREEN, HOSP PERFORMED
Amphetamines: NOT DETECTED
Barbiturates: NOT DETECTED
Benzodiazepines: NOT DETECTED
Cocaine: POSITIVE — AB
Opiates: NOT DETECTED
Tetrahydrocannabinol: NOT DETECTED

## 2020-07-01 LAB — GLUCOSE, CAPILLARY
Glucose-Capillary: 152 mg/dL — ABNORMAL HIGH (ref 70–99)
Glucose-Capillary: 91 mg/dL (ref 70–99)
Glucose-Capillary: 96 mg/dL (ref 70–99)

## 2020-07-01 LAB — CBC WITH DIFFERENTIAL/PLATELET
Abs Immature Granulocytes: 0.02 10*3/uL (ref 0.00–0.07)
Basophils Absolute: 0 10*3/uL (ref 0.0–0.1)
Basophils Relative: 0 %
Eosinophils Absolute: 0 10*3/uL (ref 0.0–0.5)
Eosinophils Relative: 0 %
HCT: UNDETERMINED % (ref 36.0–46.0)
HCT: 7.9 % — ABNORMAL LOW (ref 36.0–46.0)
Hemoglobin: UNDETERMINED g/dL (ref 12.0–15.0)
Hemoglobin: 2.4 g/dL — CL (ref 12.0–15.0)
Immature Granulocytes: 3 %
Lymphocytes Relative: 83 %
Lymphs Abs: 0.6 10*3/uL — ABNORMAL LOW (ref 0.7–4.0)
MCH: UNDETERMINED pg (ref 26.0–34.0)
MCH: 38.1 pg — ABNORMAL HIGH (ref 26.0–34.0)
MCHC: UNDETERMINED g/dL (ref 30.0–36.0)
MCHC: 30.4 g/dL (ref 30.0–36.0)
MCV: UNDETERMINED fL (ref 80.0–100.0)
MCV: 125.4 fL — ABNORMAL HIGH (ref 80.0–100.0)
Monocytes Absolute: 0.1 10*3/uL (ref 0.1–1.0)
Monocytes Relative: 7 %
Neutro Abs: 0.1 10*3/uL — CL (ref 1.7–7.7)
Neutrophils Relative %: 7 %
Platelets: UNDETERMINED 10*3/uL (ref 150–400)
Platelets: <5 10*3/uL — CL (ref 150–400)
RBC: UNDETERMINED MIL/uL (ref 3.87–5.11)
RBC: 0.63 MIL/uL — ABNORMAL LOW (ref 3.87–5.11)
RDW: UNDETERMINED % (ref 11.5–15.5)
RDW: 13.2 % (ref 11.5–15.5)
WBC: UNDETERMINED 10*3/uL (ref 4.0–10.5)
WBC: 0.7 10*3/uL — CL (ref 4.0–10.5)
nRBC: UNDETERMINED % (ref 0.0–0.2)
nRBC: 0 % (ref 0.0–0.2)

## 2020-07-01 LAB — RESP PANEL BY RT-PCR (FLU A&B, COVID) ARPGX2
Influenza A by PCR: NEGATIVE
Influenza B by PCR: NEGATIVE
SARS Coronavirus 2 by RT PCR: NEGATIVE

## 2020-07-01 LAB — HIV ANTIBODY (ROUTINE TESTING W REFLEX): HIV Screen 4th Generation wRfx: NONREACTIVE

## 2020-07-01 LAB — LIPASE, BLOOD: Lipase: 55 U/L — ABNORMAL HIGH (ref 11–51)

## 2020-07-01 LAB — MAGNESIUM: Magnesium: 2.1 mg/dL (ref 1.7–2.4)

## 2020-07-01 LAB — PROTIME-INR
INR: 1.4 — ABNORMAL HIGH (ref 0.8–1.2)
Prothrombin Time: 16.3 seconds — ABNORMAL HIGH (ref 11.4–15.2)

## 2020-07-01 LAB — LACTIC ACID, PLASMA
Lactic Acid, Venous: >11 mmol/L (ref 0.5–1.9)
Lactic Acid, Venous: >11 mmol/L (ref 0.5–1.9)

## 2020-07-01 LAB — MRSA PCR SCREENING: MRSA by PCR: POSITIVE — AB

## 2020-07-01 LAB — CBG MONITORING, ED: Glucose-Capillary: 165 mg/dL — ABNORMAL HIGH (ref 70–99)

## 2020-07-01 LAB — PREPARE RBC (CROSSMATCH)

## 2020-07-01 LAB — APTT: aPTT: 31 seconds (ref 24–36)

## 2020-07-01 IMAGING — US US RENAL
1 series · 14 of 25 positions shown · non-contrast
Comparison: CT abdomen pelvis dated [DATE].

CLINICAL DATA: Acute kidney injury.

EXAM:
RENAL / URINARY TRACT ULTRASOUND COMPLETE

[Series 1: us renal · 14 of 68 slices shown]
[im 1/68]
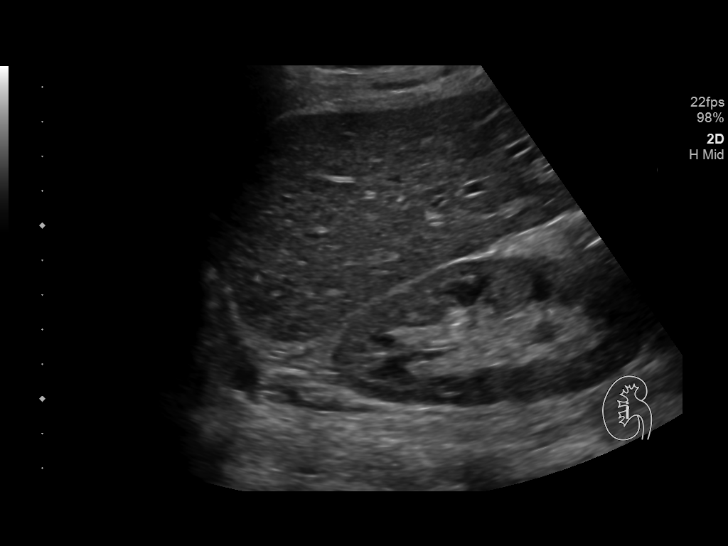
[im 6/68]
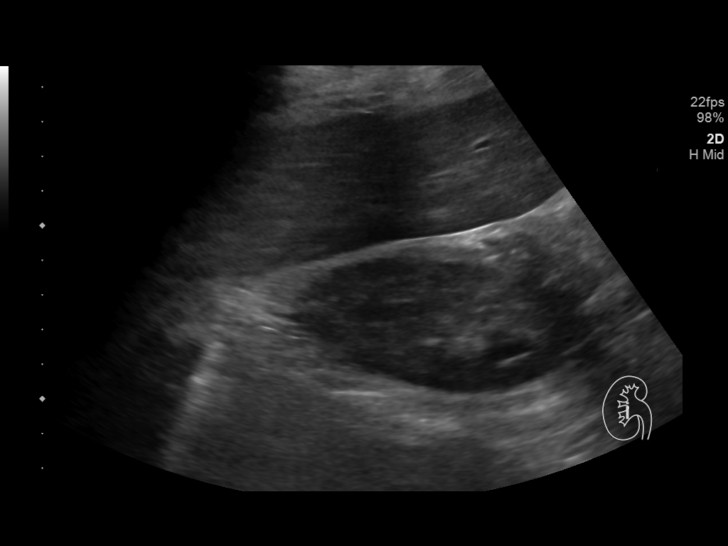
[im 12/68]
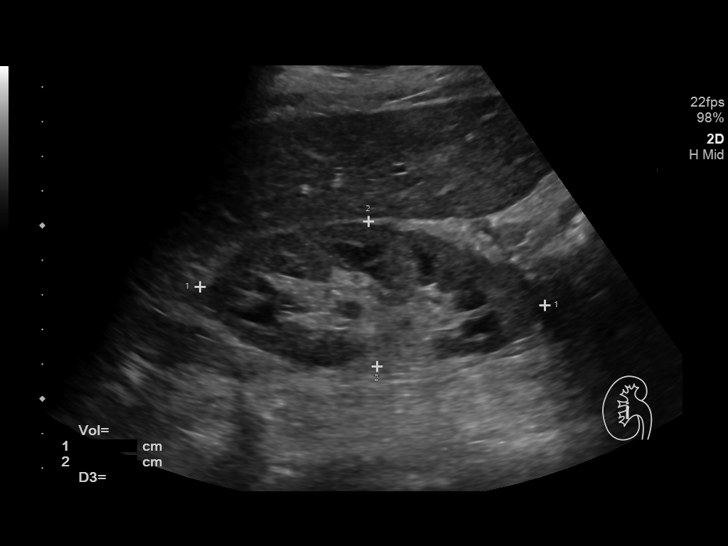
[im 17/68]
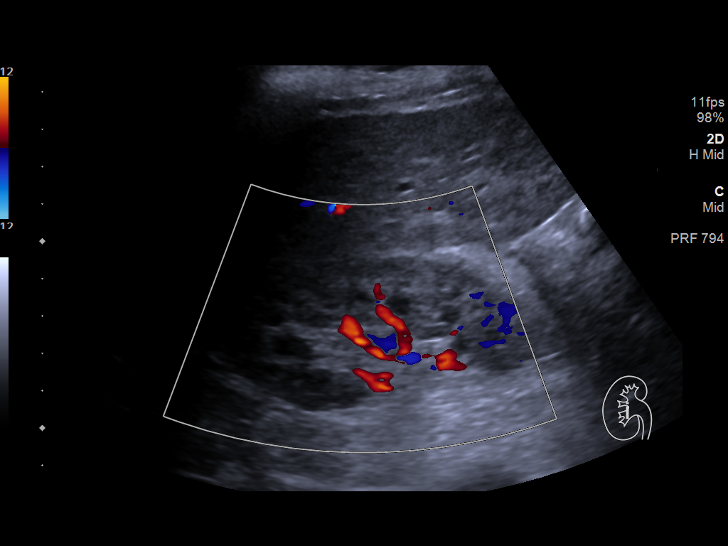
[im 23/68]
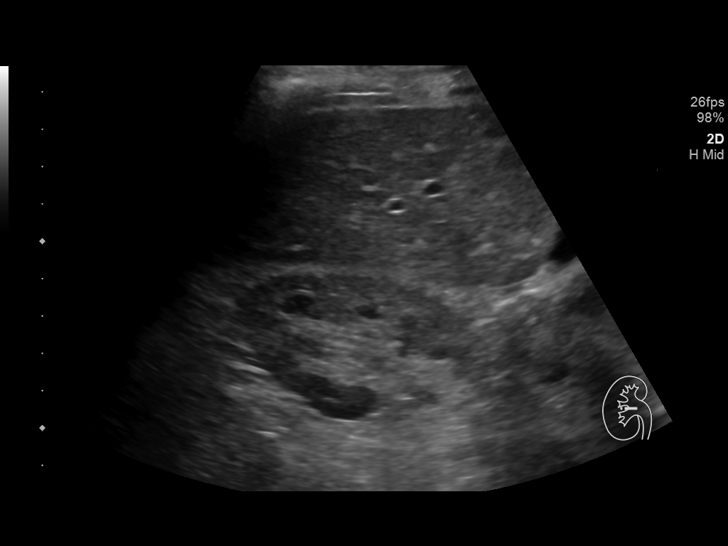
[im 26/68]
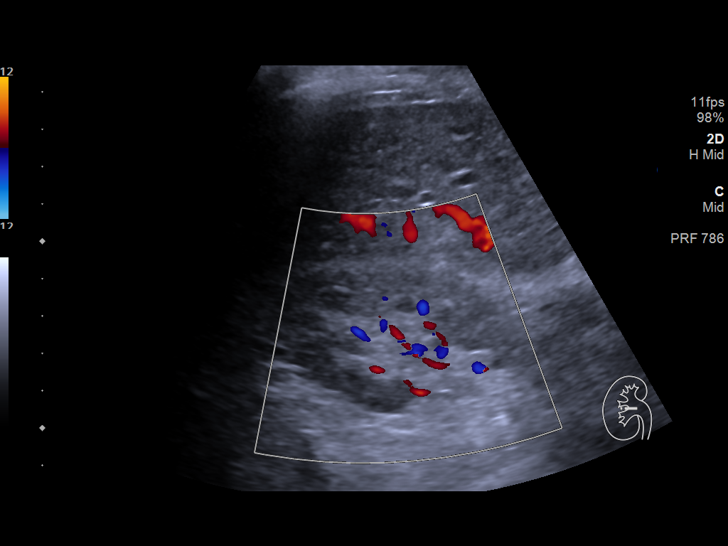
[im 31/68]
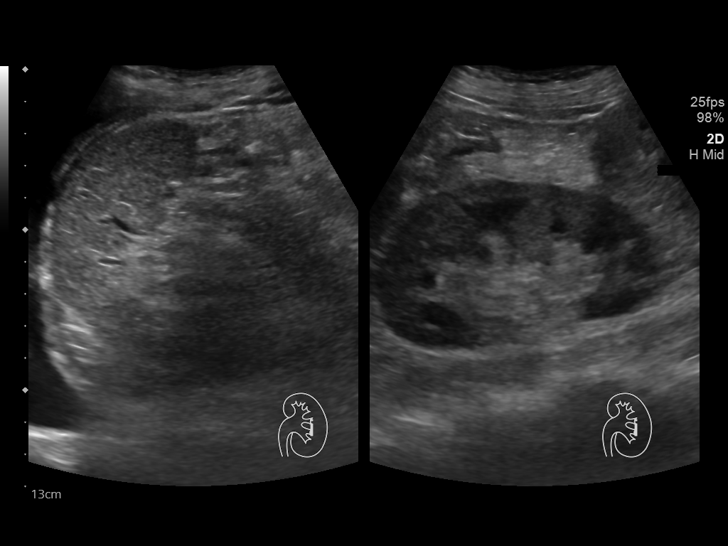
[im 37/68]
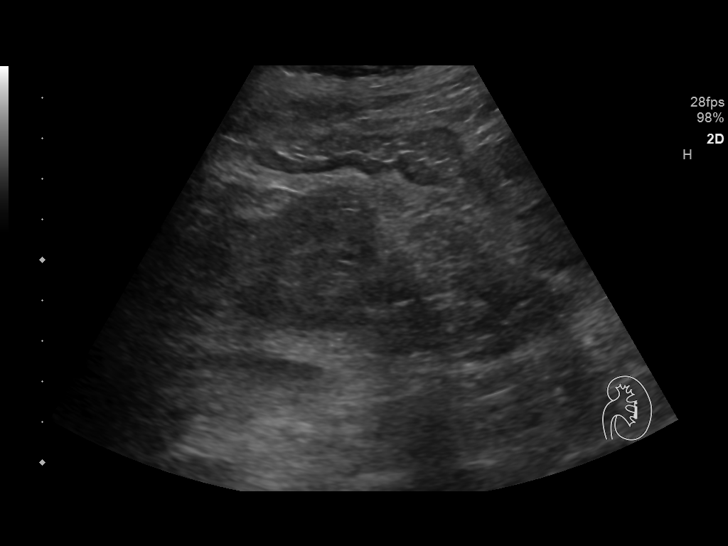
[im 42/68]
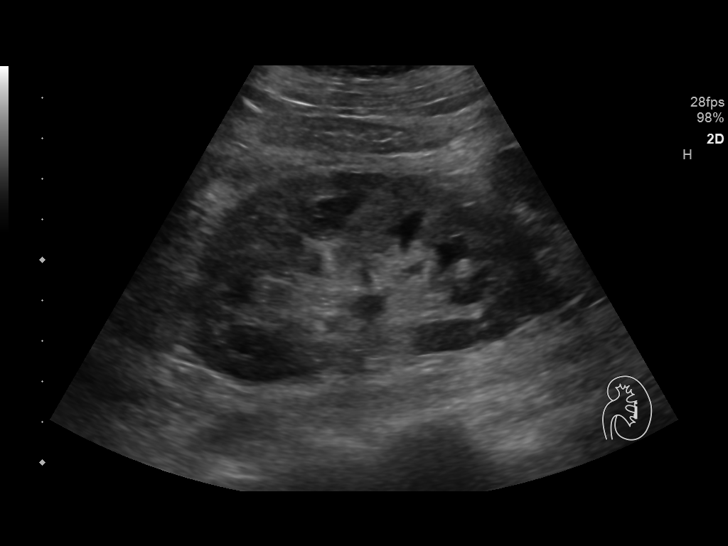
[im 45/68]
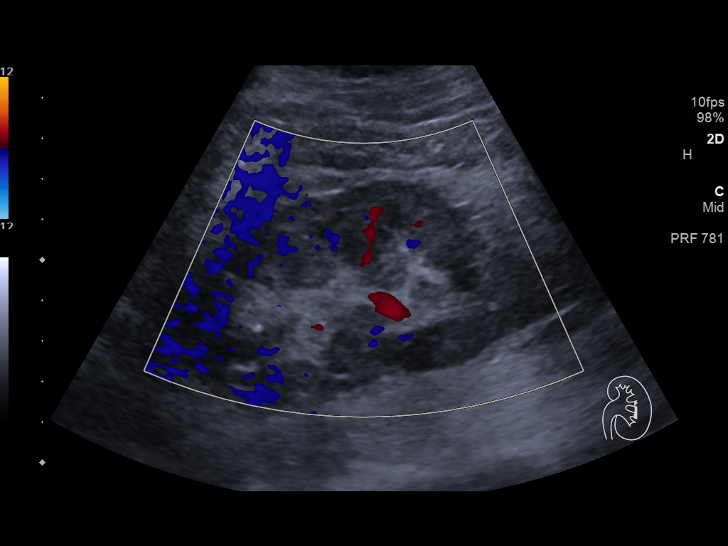
[im 51/68]
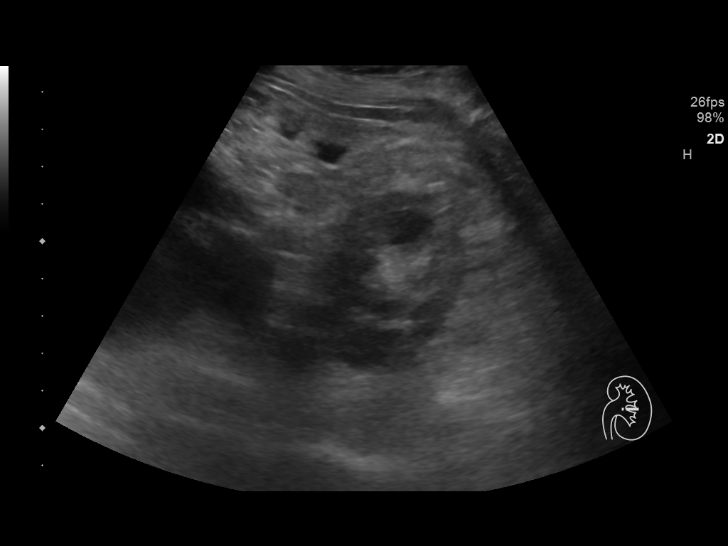
[im 56/68]
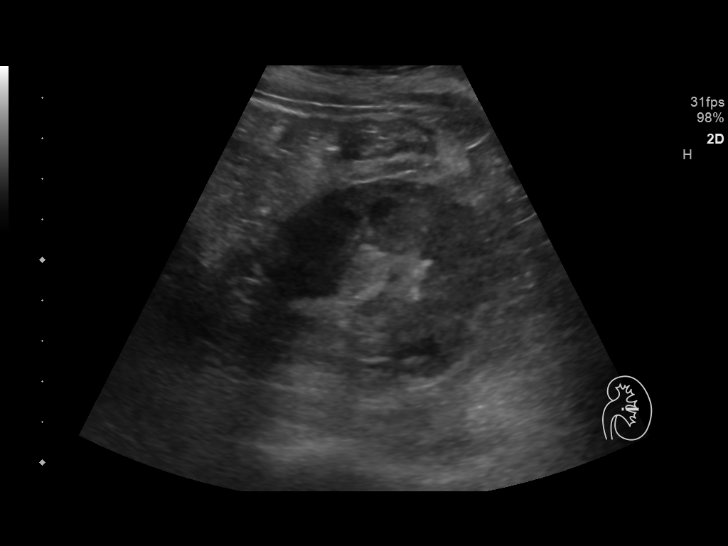
[im 62/68]
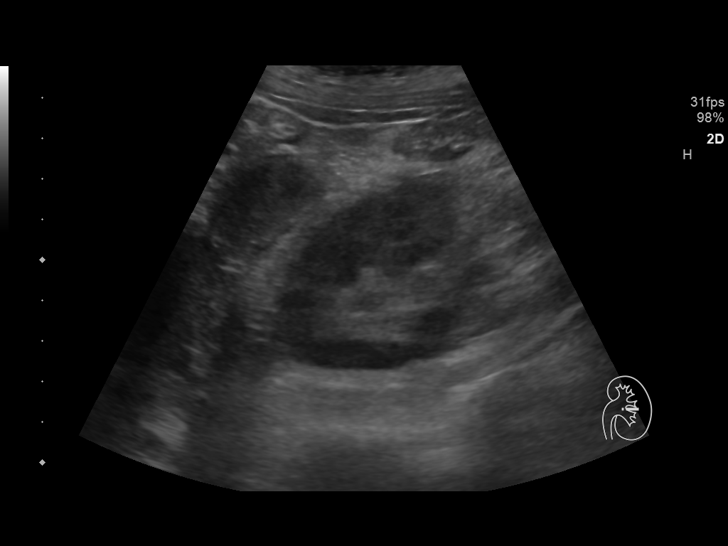
[im 68/68]
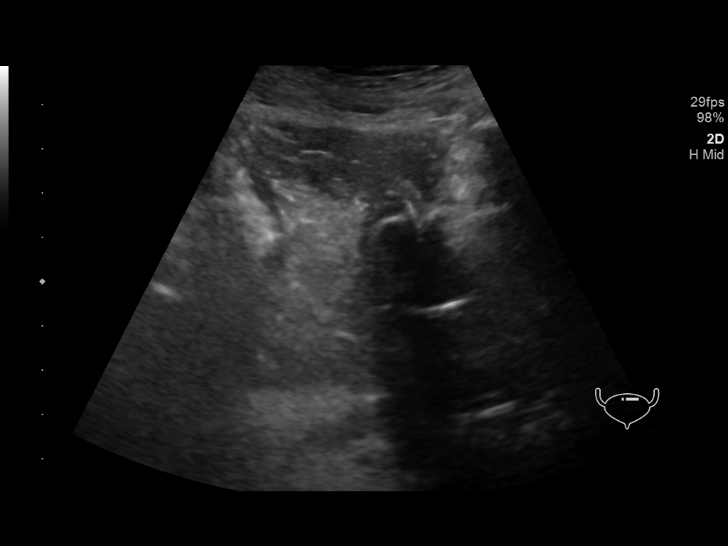

[14 of 25 positions shown; findings below may reference images not displayed]

FINDINGS: Right Kidney:

Renal measurements: 10.0 x 4.2 x 5.0 cm = volume: 109 mL.
Echogenicity within normal limits. No mass or hydronephrosis
visualized.

Left Kidney:

Renal measurements: 9.1 x 5.1 x 5.3 cm = volume: 128 mL.
Echogenicity within normal limits. No mass or hydronephrosis
visualized.

Other:

Bilateral pleural effusions.
IMPRESSION: 1. Normal renal ultrasound.
2. Bilateral pleural effusions.

## 2020-07-01 IMAGING — DX DG CHEST 1V PORT
1 series · 1 of 1 positions shown · non-contrast
Comparison: [DATE]

CLINICAL DATA: Shortness of breath

EXAM:
PORTABLE CHEST 1 VIEW

[chest ap]
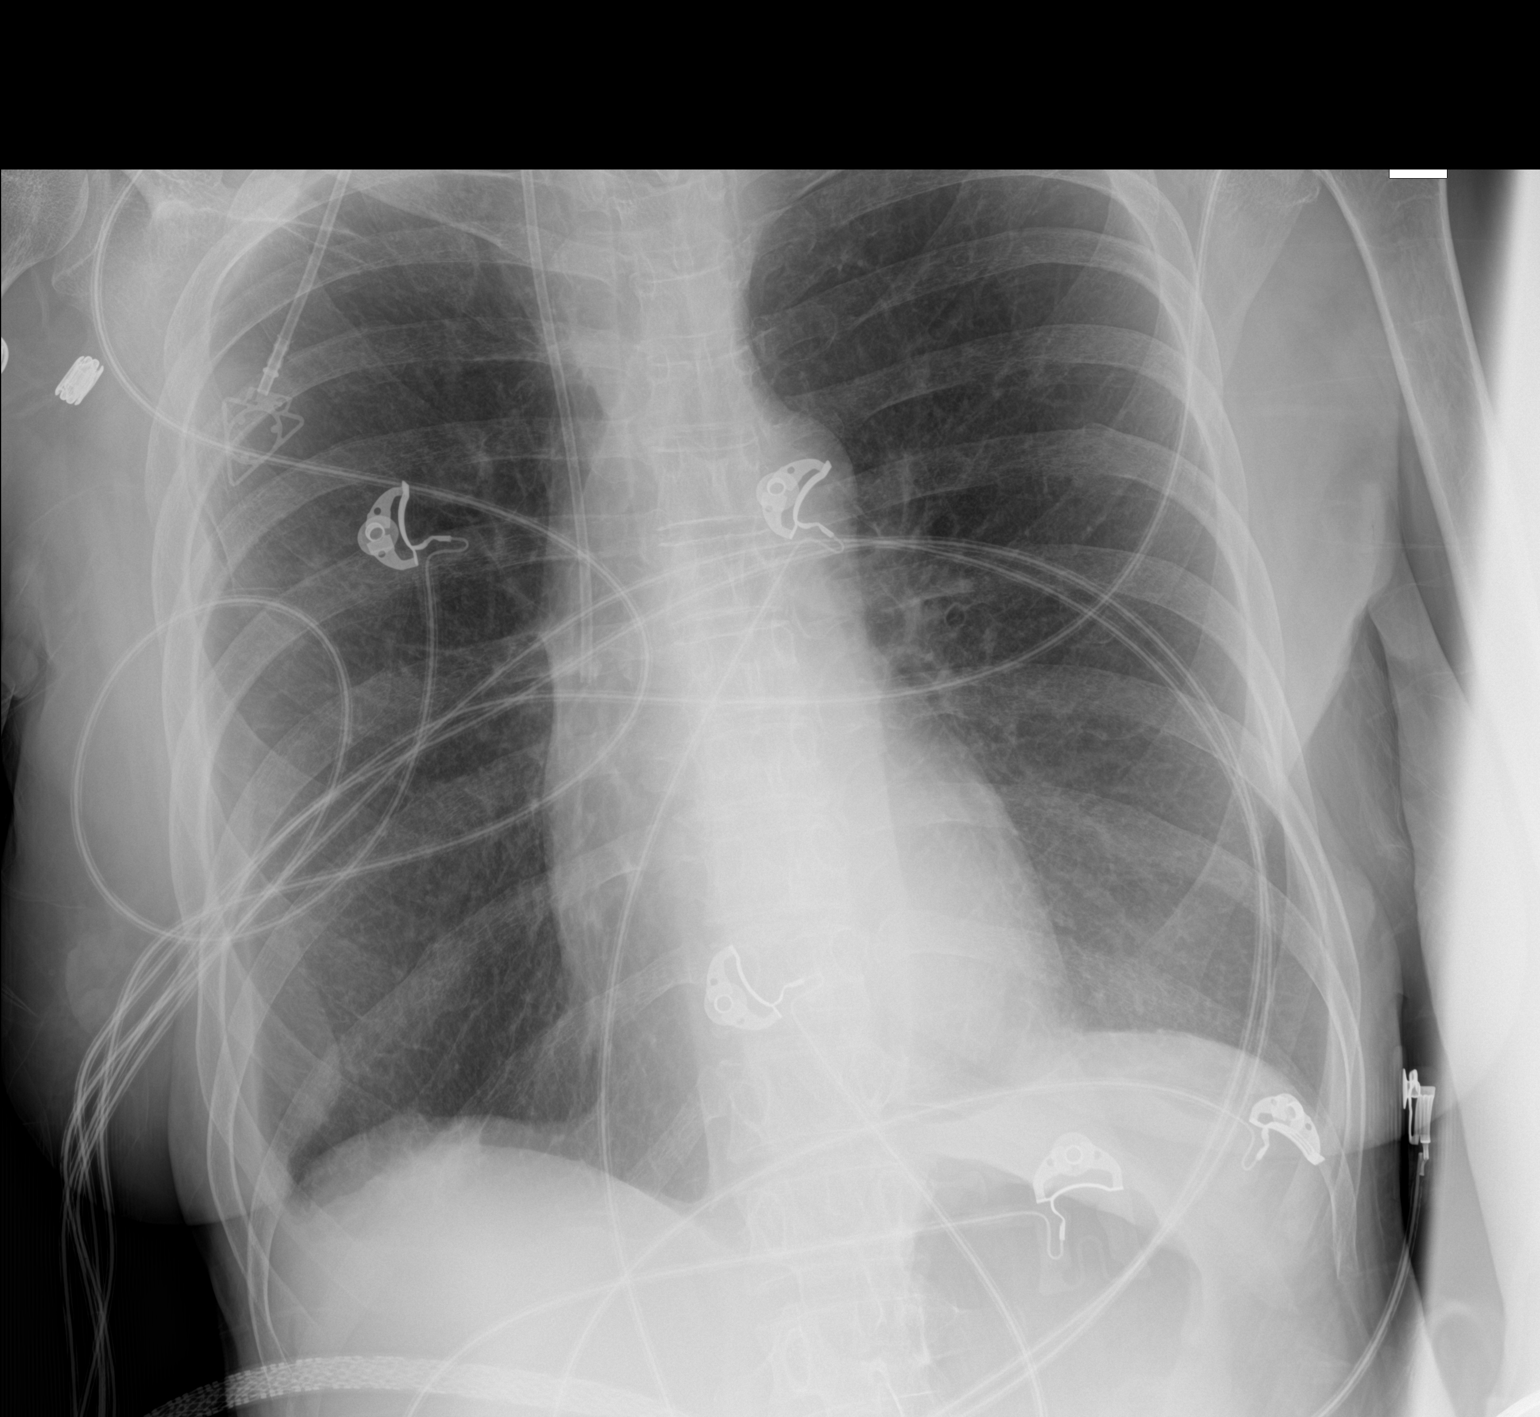

[1 of 1 positions shown; findings below may reference images not displayed]

FINDINGS: Port-A-Cath tip is in the superior vena cava. No pneumothorax. There
is a rather minimal right pleural effusion. No edema or airspace
opacity. Heart size and pulmonary vascularity are normal. No
adenopathy. No bone lesions.
IMPRESSION: Port-A-Cath as noted. Other minimal right pleural effusion. Lungs
otherwise clear. Cardiac silhouette normal.

## 2020-07-01 IMAGING — CT CT HEAD W/O CM
3 series · 14 of 47 positions shown, 16 images · non-contrast
Comparison: [DATE]

CLINICAL DATA: Recent seizure activity

EXAM:
CT HEAD WITHOUT CONTRAST
TECHNIQUE: Contiguous axial images were obtained from the base of the skull
through the vertex without intravenous contrast.

[Series 2: head wo · axial · 0.47mm/px · z∈[+1592,+1722]mm · 8 of 32 slices shown, 10 images]
[im 3/32  brain]
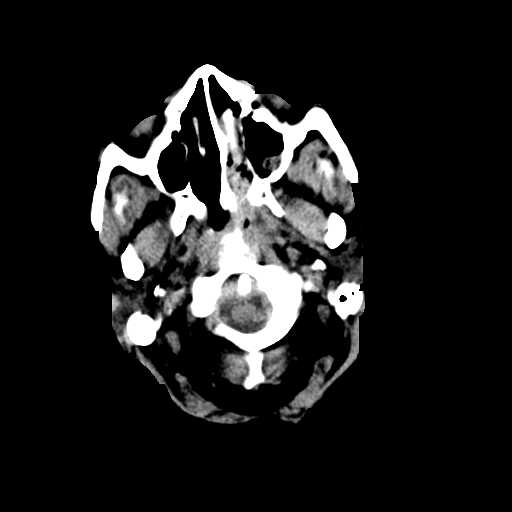
[im 3/32  bone]
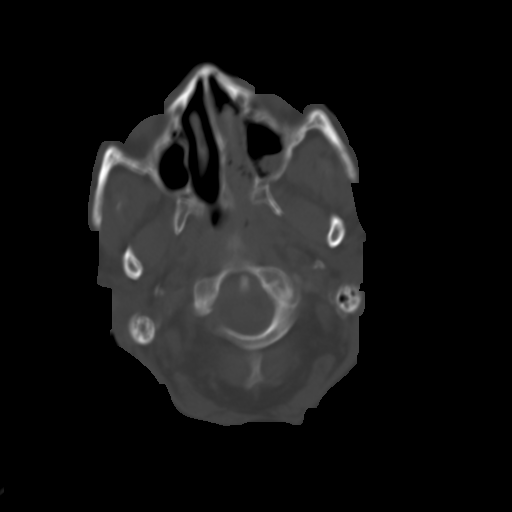
[im 7/32  brain]
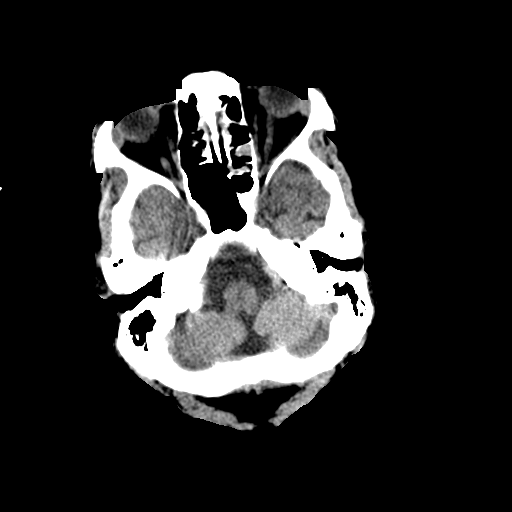
[im 10/32  brain]
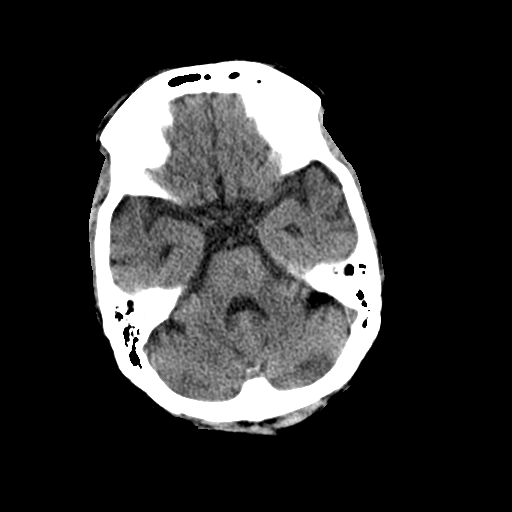
[im 14/32  brain]
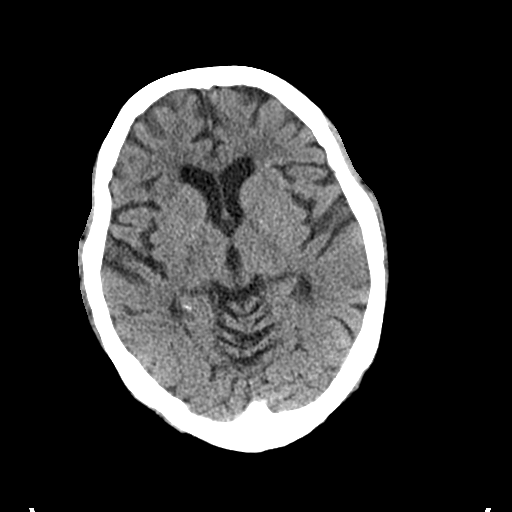
[im 18/32  brain]
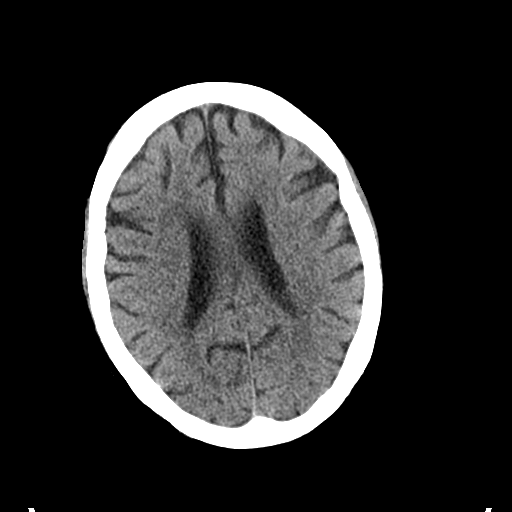
[im 18/32  bone]
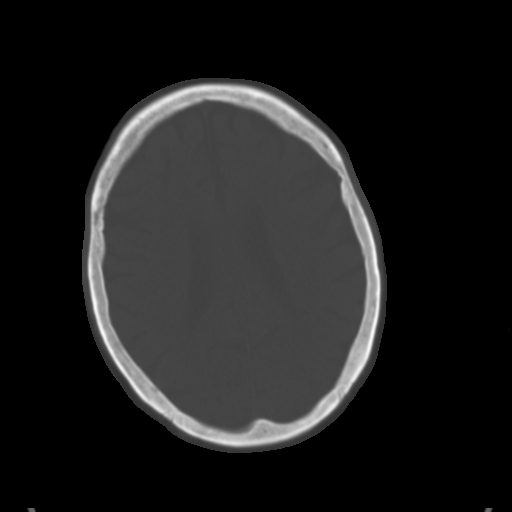
[im 22/32  brain]
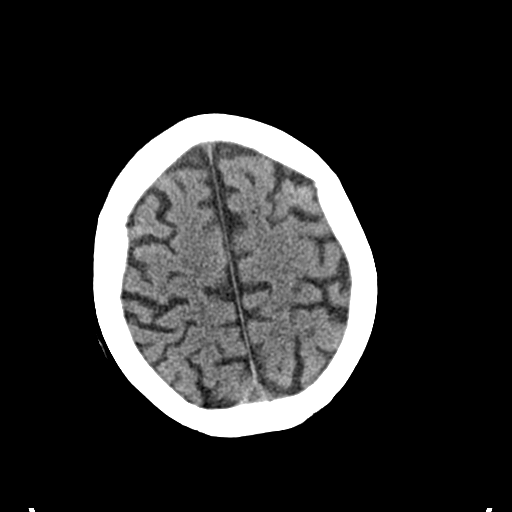
[im 25/32  brain]
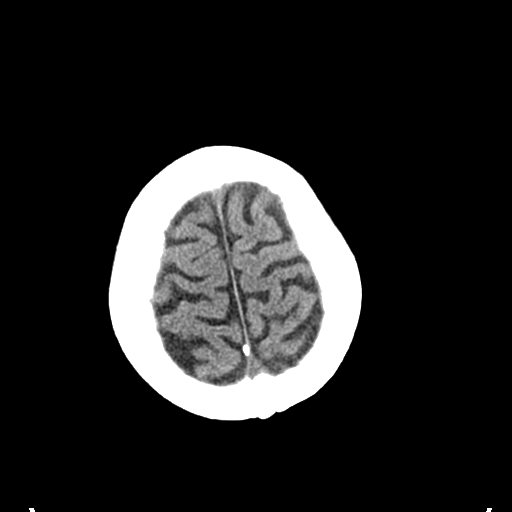
[im 29/32  brain]
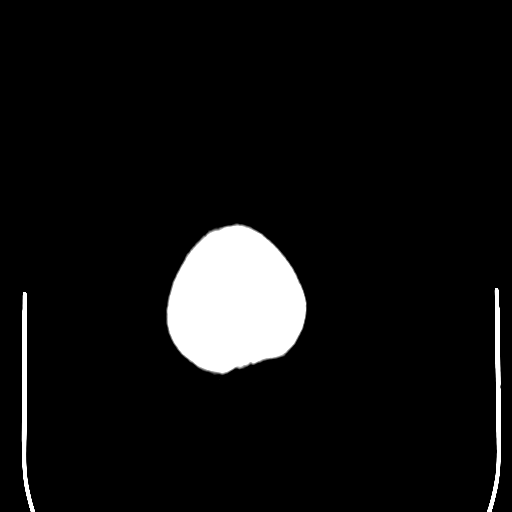

[Series 5: coronal soft tissue · coronal · 0.34mm/px · 3 of 67 slices shown]
[im 23/67  brain]
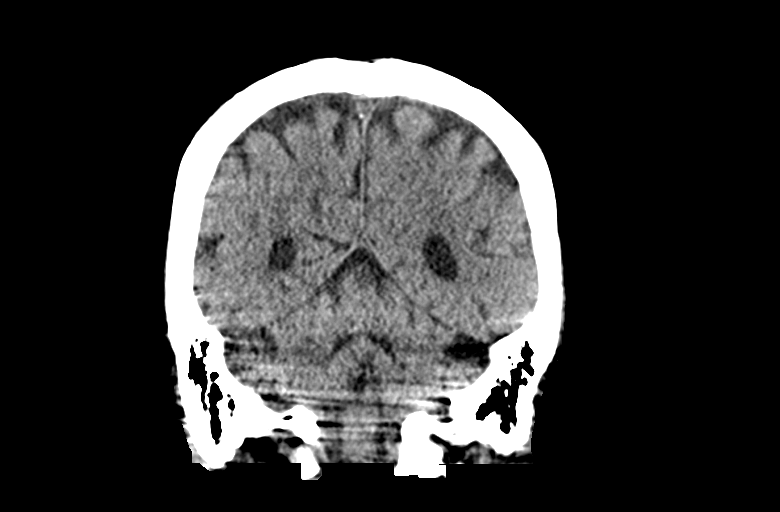
[im 30/67  brain]
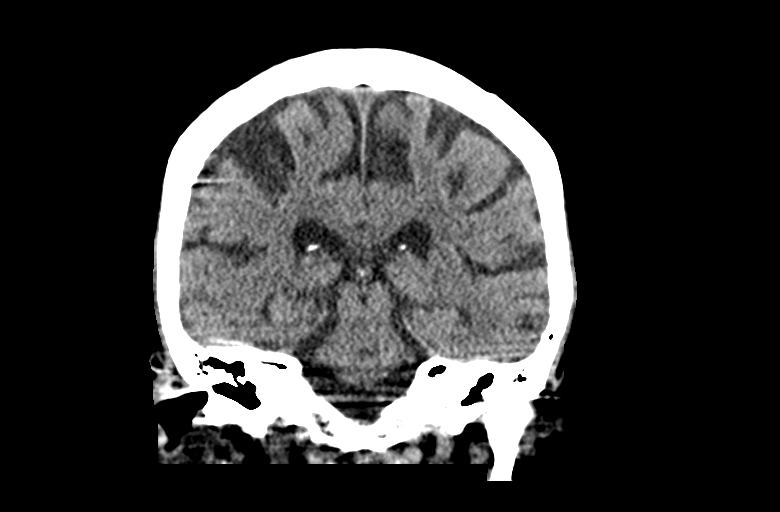
[im 37/67  brain]
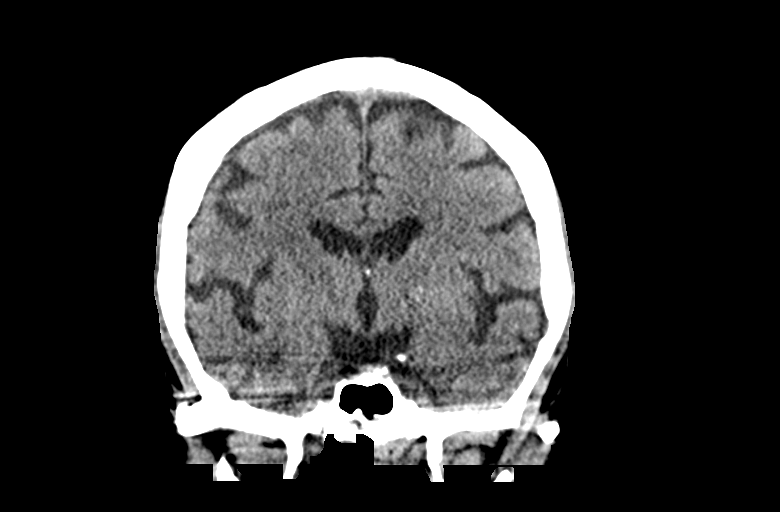

[Series 6: sagittal soft tissue · sagittal · 0.34mm/px · 3 of 51 slices shown]
[im 17/51  brain]
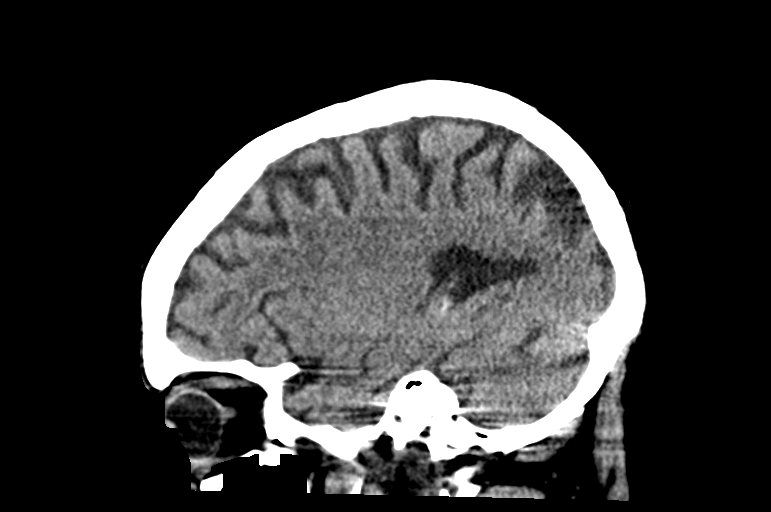
[im 26/51  brain]
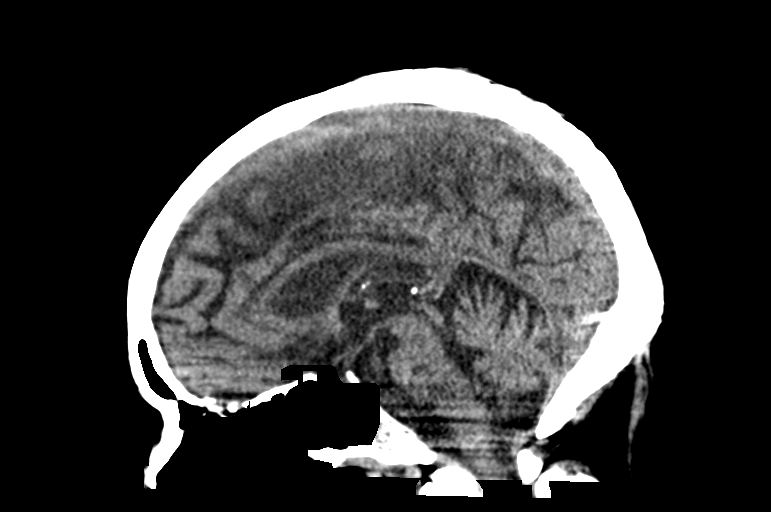
[im 34/51  brain]
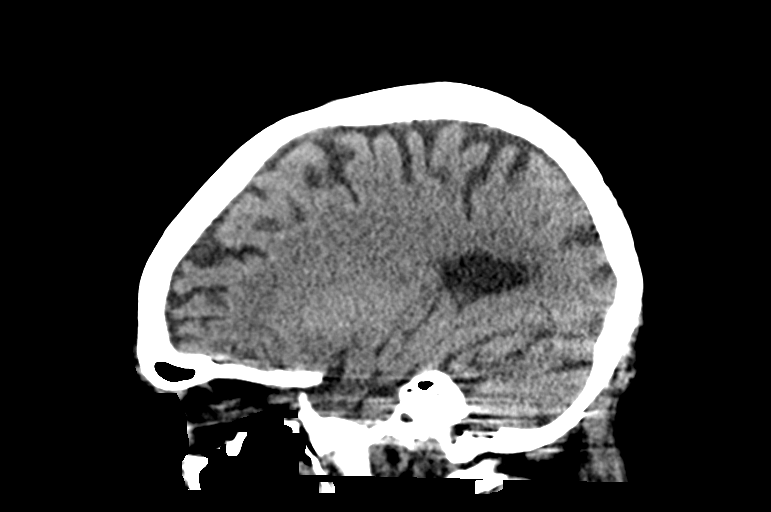

[14 of 47 positions shown; findings below may reference images not displayed]

FINDINGS: Brain: Mild atrophic changes are noted. No findings to suggest acute
hemorrhage, acute infarction or space-occupying mass lesion noted.

Vascular: No hyperdense vessel or unexpected calcification.

Skull: Normal. Negative for fracture or focal lesion.

Sinuses/Orbits: No acute finding. Chronic soft tissue changes are
noted in the left ethmoid sinuses.

Other: None.
IMPRESSION: Mild atrophic changes without acute abnormality.

## 2020-07-01 MED ORDER — DOCUSATE SODIUM 100 MG PO CAPS: 100.0000 mg | ORAL_CAPSULE | Freq: Two times a day (BID) | ORAL | Status: DC | PRN

## 2020-07-01 MED ORDER — SODIUM CHLORIDE 0.9% IV SOLUTION: Freq: Once | INTRAVENOUS | Status: AC

## 2020-07-01 MED ORDER — LORAZEPAM 2 MG/ML IJ SOLN
INTRAMUSCULAR | Status: AC
  Administered 2020-07-01: 0.5 mg
  Filled 2020-07-01: qty 1

## 2020-07-01 MED ORDER — SODIUM CHLORIDE 0.9 % IV SOLN
2.0000 g | Freq: Once | INTRAVENOUS | Status: AC
  Administered 2020-07-01: 2 g via INTRAVENOUS
  Filled 2020-07-01: qty 2

## 2020-07-01 MED ORDER — PANTOPRAZOLE SODIUM 40 MG IV SOLR
40.0000 mg | Freq: Two times a day (BID) | INTRAVENOUS | Status: DC
  Administered 2020-07-01 – 2020-07-07 (×13): 40 mg via INTRAVENOUS
  Filled 2020-07-01 (×11): qty 40

## 2020-07-01 MED ORDER — INSULIN ASPART 100 UNIT/ML ~~LOC~~ SOLN
0.0000 [IU] | SUBCUTANEOUS | Status: DC
  Administered 2020-07-01: 2 [IU] via SUBCUTANEOUS
  Administered 2020-07-05 – 2020-07-08 (×3): 1 [IU] via SUBCUTANEOUS
  Filled 2020-07-01: qty 0.09

## 2020-07-01 MED ORDER — LACTATED RINGERS IV BOLUS
1000.0000 mL | Freq: Once | INTRAVENOUS | Status: AC
  Administered 2020-07-01: 1000 mL via INTRAVENOUS

## 2020-07-01 MED ORDER — CHLORHEXIDINE GLUCONATE CLOTH 2 % EX PADS
6.0000 | MEDICATED_PAD | Freq: Every day | CUTANEOUS | Status: DC
  Administered 2020-07-01 – 2020-08-02 (×34): 6 via TOPICAL

## 2020-07-01 MED ORDER — SODIUM CHLORIDE 0.9 % IV SOLN
5.0000 mg | Freq: Once | INTRAVENOUS | Status: AC
  Administered 2020-07-01: 5 mg via INTRAVENOUS
  Filled 2020-07-01: qty 1

## 2020-07-01 MED ORDER — LORAZEPAM 0.5 MG PO TABS
0.5000 mg | ORAL_TABLET | Freq: Once | ORAL | Status: AC
  Administered 2020-07-01: 0.5 mg via ORAL

## 2020-07-01 MED ORDER — POLYETHYLENE GLYCOL 3350 17 G PO PACK: 17.0000 g | PACK | Freq: Every day | ORAL | Status: DC | PRN

## 2020-07-01 MED ORDER — SODIUM CHLORIDE 0.9 % IV BOLUS
1000.0000 mL | Freq: Once | INTRAVENOUS | Status: AC
  Administered 2020-07-01: 1000 mL via INTRAVENOUS

## 2020-07-01 MED ORDER — MUPIROCIN 2 % EX OINT
1.0000 "application " | TOPICAL_OINTMENT | Freq: Two times a day (BID) | CUTANEOUS | Status: AC
  Administered 2020-07-02 – 2020-07-06 (×10): 1 via NASAL
  Filled 2020-07-01 (×3): qty 22

## 2020-07-01 MED ORDER — LEVETIRACETAM IN NACL 1000 MG/100ML IV SOLN
1000.0000 mg | Freq: Once | INTRAVENOUS | Status: AC
  Administered 2020-07-01: 1000 mg via INTRAVENOUS
  Filled 2020-07-01: qty 100

## 2020-07-01 MED ORDER — LORAZEPAM 2 MG/ML IJ SOLN: 0.5000 mg | Freq: Once | INTRAMUSCULAR | Status: AC

## 2020-07-01 MED ORDER — LACTATED RINGERS IV SOLN: INTRAVENOUS | Status: DC

## 2020-07-01 MED ORDER — LACTATED RINGERS IV BOLUS (SEPSIS)
1000.0000 mL | Freq: Once | INTRAVENOUS | Status: AC
  Administered 2020-07-01: 1000 mL via INTRAVENOUS

## 2020-07-01 MED ORDER — CYANOCOBALAMIN 1000 MCG/ML IJ SOLN
1000.0000 ug | Freq: Once | INTRAMUSCULAR | Status: AC
  Administered 2020-07-01: 1000 ug via INTRAMUSCULAR
  Filled 2020-07-01: qty 1

## 2020-07-01 MED ORDER — SODIUM CHLORIDE 0.9 % IV SOLN
1.0000 g | Freq: Once | INTRAVENOUS | Status: AC
  Administered 2020-07-01: 1 g via INTRAVENOUS
  Filled 2020-07-01: qty 10

## 2020-07-01 MED ORDER — VANCOMYCIN HCL IN DEXTROSE 1-5 GM/200ML-% IV SOLN
1000.0000 mg | Freq: Once | INTRAVENOUS | Status: AC
  Administered 2020-07-01: 1000 mg via INTRAVENOUS
  Filled 2020-07-01: qty 200

## 2020-07-01 NOTE — H&P (Addendum)
NAME:  Brenda [name], MRN:  [id number], DOB:  1960/11/07, LOS: 0 ADMISSION DATE:  07/01/2020, CONSULTATION DATE:  07/01/20 REFERRING MD:  EDP, CHIEF COMPLAINT:  Epistaxis and dark stools   History of Present Illness:   Brenda [name] is a 60 y.o. F with PMH of stage IV non-small cell lung cancer, adenocarcinoma of right lung, depression who is currently undergoing active chemotherapy by Dr. Earlie Server.  Today, she presented with epistaxis and a dark black stools and generalized weakness for the last few days.  BP on arrival as 105/66 without tachycardia.  She has been hypothermic 80 7.68F.  CXR with Port-A-Cath in place and minimal right pleural effusion but otherwise clear.  Her lactic acid was > 11, with INR 1.4 and AKI of creatinine over 5 without hyperkalemia and metabolic acidosis.  Her CBC has been difficult for lab to read, repeat is pending.  2 units of PRBCs were ordered and she was given 3 L IV fluid, cefepime, vancomycin.  During ED course, she became more somnolent and had focal seizure-like activity with eyes rolling back in her head.  She was given Ativan and Keppra.  Head CT ordered.  Warm fluids and Bair hugger initiated to treat hypothermia.   Chemotherapy was initiated 7/821 for 11 cycles and current regimen of with carboplatin for an AUC of 5, Alimta 500 mg/m2, and Keytruda 200 mg IV every 3 weeks.  Alimta occasionally held due to renal insufficiency and neuropenia.    Pertinent  Medical History   has a past medical history of Breast cancer ([place]), Depression, and Mental disorder.  Stage IV non-small cell lung Ca dx'd May 2021   Significant Hospital Events: Including procedures, antibiotic start and stop dates in addition to other pertinent events   . 07/02/19 admit, seizure activity start vancomycin and cefepime  Interim History / Subjective:   Post seizure activity, patient is awake and nodding to questions  Objective   Blood pressure 101/71, pulse (!) 113, temperature (!)  89.8 F (32.1 C), resp. rate (!) 28, last menstrual period 01/19/2011, SpO2 100 %.       No intake or output data in the 24 hours ending 07/01/20 1345 There were no vitals filed for this visit.  General: Chronically ill-appearing female, resting in no distress HEENT: MM pink/moist, dark blood surrounding bilateral nares, no active bleed.  Nasal cannula oxygen, pupils equal and reactive Neuro: Appears fatigued, but opens eyes to voice and nods to questions, following commands, moving extremities equally without obvious focal neuro deficits CV: s1s2 tachycardic, no m/r/g PULM: No distress, no hypoxia on minimal nasal cannula oxygen, slightly decreased air entry bilateral bases but no rhonchi or wheezing GI: soft, bsx4 active , nontender Extremities: warm/dry, no edema  Skin: no rashes or lesions   Labs/imaging that I havepersonally reviewed  (right click and "Reselect all SmartList Selections" daily)  4/11 CXR>> no evidence of infiltrate, CMP, PT/INR, lactic acid  Resolved Hospital Problem list     Assessment & Plan:  60 year old female with stage IV lung cancer who presented with epistaxis fatigue and melena and found to have acute kidney injury with seizure activity in the ED along with hypothermia and tachycardia.  CBC has not resulted.  She has been elevated lactic acid with multiorgan failure.  PCCM consulted for admission   Acute kidney injury, metabolic acidosis Creatinine today 5.5, baseline 1.0-1.3, suspect this may be secondary to chemotherapy in combination with hypovolemia or ATN secondary to sepsis.  Making urine Plan: -Repeat  metabolic panel after IV fluid, monitor urine output, consider urine studies -Renal ultrasound -Replete calcium, monitor electrolytes -No hyperkalemia or indication for urgent dialysis -Avoid nephrotoxins as able   Seizure-like activity -Eyes rolled back with decreased responsiveness, also has a history of cocaine abuse and confusion during  prior admissions P: -Stat head CT pending, received Ativan and Keppra -Consider neuro consult and EEG -Seizure precautions -UDS    Possible sepsis-hypothermia, tachycardia, unclear source Temp as low as 80 64F, chest x-ray without infiltrate, UA pending Lactic acidosis could be secondary to seizure activity P: -Low blood and urine cultures, continue broad-spectrum antibiotics with vancomycin and ceftriaxone -Received 30 cc/kg IV fluid -No hypotension, monitor to maintain MAP>65 -TSH checked in the last 2 weeks and within normal limits   Epistaxis and possible GI bleed She has a history of upper GI bleed with erosive gastropathy and gastritis and was admitted 03/2020.  She was discharged home on PPI twice daily P: -Continue PPI Protonix 40 mg twice daily -Transfused 2 units PRBCs in the ED -Serial H&H, transfuse as needed -GI consult   Hyperglycemia Start SSI and check hemoglobin A1c    Stage IV adenocarcinoma right lung, non-small cell lung cancer Following with Dr. Julien Nordmann Current regimen of with carboplatin for an AUC of 5, Alimta 500 mg/m2, and Keytruda 200 mg IV every 3 weeks, last treatment 3/29 with Alimta and Keytruda     Best practice (right click and "Reselect all SmartList Selections" daily)  Diet:  NPO Pain/Anxiety/Delirium protocol (if indicated): No VAP protocol (if indicated): Not indicated DVT prophylaxis: Contraindicated GI prophylaxis: PPI Glucose control:  SSI Yes Central venous access:  N/A Arterial line:  N/A Foley:  N/A Mobility:  bed rest  PT consulted: N/A Last date of multidisciplinary goals of care discussion: due 4/18 Code Status:  full code Disposition: ICU  Labs   CBC: No results for input(s): WBC, NEUTROABS, HGB, HCT, MCV, PLT in the last 168 hours.  Basic Metabolic Panel: Recent Labs  Lab 07/01/20 1155  NA QUESTIONABLE RESULTS, RECOMMEND RECOLLECT TO VERIFY  K QUESTIONABLE RESULTS, RECOMMEND RECOLLECT TO VERIFY  CL  QUESTIONABLE RESULTS, RECOMMEND RECOLLECT TO VERIFY  CO2 QUESTIONABLE RESULTS, RECOMMEND RECOLLECT TO VERIFY  GLUCOSE QUESTIONABLE RESULTS, RECOMMEND RECOLLECT TO VERIFY  BUN QUESTIONABLE RESULTS, RECOMMEND RECOLLECT TO VERIFY  CREATININE QUESTIONABLE RESULTS, RECOMMEND RECOLLECT TO VERIFY  CALCIUM QUESTIONABLE RESULTS, RECOMMEND RECOLLECT TO VERIFY   GFR: CrCl cannot be calculated (This lab value cannot be used to calculate CrCl because it is not a number: QUESTIONABLE RESULTS, RECOMMEND RECOLLECT TO VERIFY). Recent Labs  Lab 07/01/20 1151  LATICACIDVEN >11.0*    Liver Function Tests: Recent Labs  Lab 07/01/20 1155  AST QUESTIONABLE RESULTS, RECOMMEND RECOLLECT TO VERIFY  ALT QUESTIONABLE RESULTS, RECOMMEND RECOLLECT TO VERIFY  ALKPHOS QUESTIONABLE RESULTS, RECOMMEND RECOLLECT TO VERIFY  BILITOT QUESTIONABLE RESULTS, RECOMMEND RECOLLECT TO VERIFY  PROT QUESTIONABLE RESULTS, RECOMMEND RECOLLECT TO VERIFY  ALBUMIN QUESTIONABLE RESULTS, RECOMMEND RECOLLECT TO VERIFY   Recent Labs  Lab 07/01/20 1155  LIPASE QUESTIONABLE RESULTS, RECOMMEND RECOLLECT TO VERIFY   No results for input(s): AMMONIA in the last 168 hours.  ABG    Component Value Date/Time   HCO3 25.2 04/08/2020 1305   O2SAT 19.0 04/08/2020 1305     Coagulation Profile: Recent Labs  Lab 07/01/20 1156  INR 1.4*    Cardiac Enzymes: No results for input(s): CKTOTAL, CKMB, CKMBINDEX, TROPONINI in the last 168 hours.  HbA1C: No results found for: HGBA1C  CBG: No results  for input(s): GLUCAP in the last 168 hours.  Review of Systems:   Review of Systems -difficult to obtain secondary to decreased mental status   Past Medical History:  She,  has a past medical history of Breast cancer ([place]), Depression, and Mental disorder.   Surgical History:   Past Surgical History:  Procedure Laterality Date  . ESOPHAGOGASTRODUODENOSCOPY (EGD) WITH PROPOFOL N/A 04/09/2020   Procedure: ESOPHAGOGASTRODUODENOSCOPY  (EGD) WITH PROPOFOL;  Surgeon: Doran Stabler, MD;  Location: [facility];  Service: Gastroenterology;  Laterality: N/A;  . IR IMAGING GUIDED PORT INSERTION  11/03/2019  . TUBAL LIGATION    . VIDEO BRONCHOSCOPY WITH ENDOBRONCHIAL NAVIGATION N/A 04/04/2019   Procedure: VIDEO BRONCHOSCOPY WITH ENDOBRONCHIAL NAVIGATION;  Surgeon: Garner Nash, DO;  Location: [facility];  Service: Thoracic;  Laterality: N/A;  . VIDEO BRONCHOSCOPY WITH ENDOBRONCHIAL ULTRASOUND N/A 04/04/2019   Procedure: VIDEO BRONCHOSCOPY WITH ENDOBRONCHIAL ULTRASOUND;  Surgeon: Garner Nash, DO;  Location: [facility];  Service: Thoracic;  Laterality: N/A;     Social History:   reports that she quit smoking about 13 months ago. Her smoking use included cigarettes. She has a 15.00 pack-year smoking history. She has never used smokeless tobacco. She reports current alcohol use of about 84.0 standard drinks of alcohol per week. She reports current drug use. Drug: Cocaine.   Family History:  Her family history is not on file.   Allergies Allergies  Allergen Reactions  . Aspirin Adult Low [Aspirin] Other (See Comments)    Stomach upset     Home Medications  Prior to Admission medications   Medication Sig Start Date End Date Taking? Authorizing Provider  acetaminophen (TYLENOL) 325 MG tablet Take 2 tablets (650 mg total) by mouth every 6 (six) hours as needed for mild pain, moderate pain or headache. 04/11/20  Yes Domenic Polite, MD  ferrous sulfate 325 (65 FE) MG tablet TAKE 1 TABLET (325 MG TOTAL) BY MOUTH 2 (TWO) TIMES DAILY WITH A MEAL. Patient taking differently: Take 325 mg by mouth 2 (two) times daily with a meal. 04/11/20 04/11/21 Yes Domenic Polite, MD  folic acid (FOLVITE) 1 MG tablet TAKE 1 TABLET (1 MG TOTAL) BY MOUTH DAILY. Patient taking differently: Take 1 mg by mouth daily. 04/11/20 04/11/21 Yes Domenic Polite, MD  gabapentin (NEURONTIN) 300 MG capsule Take 1 capsule (300 mg total) by mouth at bedtime. 06/11/20  Yes  Tanner, Lyndon Code., PA-C  lidocaine-prilocaine (EMLA) cream Apply 1 application topically as needed. Patient taking differently: Apply 1 application topically as needed (port access). 10/19/19  Yes Heilingoetter, Cassandra L, PA-C  pantoprazole (PROTONIX) 40 MG tablet TAKE 1 TABLET (40 MG TOTAL) BY MOUTH 2 (TWO) TIMES DAILY. Patient taking differently: Take 40 mg by mouth 2 (two) times daily. 04/11/20 04/11/21 Yes Domenic Polite, MD  potassium chloride SA (KLOR-CON) 20 MEQ tablet TAKE 1 TABLET (20 MEQ TOTAL) BY MOUTH ONCE FOR 1 DOSE. Patient not taking: Reported on 07/01/2020 04/17/20 04/17/21  Curt Bears, MD  prochlorperazine (COMPAZINE) 10 MG tablet Take 1 tablet (10 mg total) by mouth every 6 (six) hours as needed for nausea or vomiting. Patient not taking: Reported on 04/08/2020 01/17/20 04/11/20  Heilingoetter, Cassandra L, PA-C     Critical care time: 55 minutes     CRITICAL CARE Performed by: Otilio Carpen Denelle Capurro   Total critical care time: 55 minutes  Critical care time was exclusive of separately billable procedures and treating other patients.  Critical care was necessary to treat or prevent  imminent or life-threatening deterioration.  Critical care was time spent personally by me on the following activities: development of treatment plan with patient and/or surrogate as well as nursing, discussions with consultants, evaluation of patient's response to treatment, examination of patient, obtaining history from patient or surrogate, ordering and performing treatments and interventions, ordering and review of laboratory studies, ordering and review of radiographic studies, pulse oximetry and re-evaluation of patient's condition.  Otilio Carpen Braylei Totino, PA-C  Pulmonary & Critical care See Amion for pager If no response to pager , please call [phone] until 7pm After 7:00 pm call Elink  [phone]

## 2020-07-01 NOTE — ED Notes (Signed)
Per lab, 2nd CBC results are not reading, just like the first. CBC obtained before fluids or medications were initiated per pt's RN. MD stated he wanted the CBC results in the chart.

## 2020-07-01 NOTE — Progress Notes (Signed)
Notified provider of need to order repeat lactic acid (#3).

## 2020-07-01 NOTE — ED Triage Notes (Signed)
Pt arriving via EMS from home (hotel) for gen weakness and hypotension. Pt reports nose bleed and vomiting for last few days. Pt reports dark black stools.

## 2020-07-01 NOTE — Plan of Care (Signed)

## 2020-07-01 NOTE — Progress Notes (Signed)
Following for code sepsis 

## 2020-07-01 NOTE — ED Provider Notes (Signed)
[facility] DEPT Provider Note   CSN: [id number] Arrival date & time: 07/01/20  1100     History Chief Complaint  Patient presents with  . generalized weakness  . Emesis    Silena Wyss is a 60 y.o. female.  60 yo F with a chief complaints of nosebleeds and fatigue and shortness of breath.  Going on for couple days now.  States that this bleeding has come and gone and currently does not feel like it bleeding.  She does feel very fatigued and has noted some dark stools.  Is currently getting chemotherapy for stage IV lung cancer.  She denies any chest pain denies fever.  Denies urinary symptoms.  Denies abdominal pain.  The history is provided by the patient.  Emesis Associated symptoms: no abdominal pain, no arthralgias, no chills, no fever, no headaches and no myalgias   Illness Severity:  Moderate Onset quality:  Gradual Duration:  2 days Timing:  Constant Progression:  Worsening Chronicity:  New Associated symptoms: nausea and vomiting   Associated symptoms: no abdominal pain, no chest pain, no congestion, no fever, no headaches, no myalgias, no rhinorrhea, no shortness of breath and no wheezing        Past Medical History:  Diagnosis Date  . Breast cancer ([place])   . Depression   . Mental disorder     Patient Active Problem List   Diagnosis Date Noted  . Acute blood loss anemia   . Renal failure 07/01/2020  . AKI (acute kidney injury) ([place])   . Metabolic acidosis   . Epistaxis   . Seizure-like activity ([place])   . Swelling of lower extremity 05/29/2020  . Hypoalbuminemia 05/29/2020  . Acute on chronic anemia 04/07/2020  . Gastrointestinal hemorrhage 04/07/2020  . Acute encephalopathy 04/07/2020  . Pancytopenia ([place]) 04/07/2020  . Polysubstance abuse ([place]) 04/07/2020  . Hypokalemia 10/19/2019  . Neutropenia ([place]) 10/05/2019  . Malignant neoplasm of right lung ([place]) 09/14/2019  . Encounter for antineoplastic chemotherapy  09/14/2019  . Encounter for antineoplastic immunotherapy 09/14/2019  . Goals of care, counseling/discussion 09/14/2019  . Tobacco abuse counseling 09/14/2019  . 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 Alcohol abuse   . Acute respiratory failure ([place]) 04/03/2019  . Lung mass   . Multifocal pneumonia   . Major depressive disorder   . 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 ESOPHAGOGASTRODUODENOSCOPY (EGD) WITH PROPOFOL N/A 04/09/2020   Procedure: ESOPHAGOGASTRODUODENOSCOPY (EGD) WITH PROPOFOL;  Surgeon: Doran Stabler, MD;  Location: [facility];  Service: Gastroenterology;  Laterality: N/A;  . IR IMAGING GUIDED PORT INSERTION  11/03/2019  . TUBAL LIGATION    . VIDEO BRONCHOSCOPY WITH ENDOBRONCHIAL NAVIGATION N/A 04/04/2019   Procedure: VIDEO BRONCHOSCOPY WITH ENDOBRONCHIAL NAVIGATION;  Surgeon: Garner Nash, DO;  Location: [facility];  Service: Thoracic;  Laterality: N/A;  . VIDEO BRONCHOSCOPY WITH ENDOBRONCHIAL ULTRASOUND N/A 04/04/2019   Procedure: VIDEO BRONCHOSCOPY WITH ENDOBRONCHIAL ULTRASOUND;  Surgeon: Garner Nash, DO;  Location: [facility];  Service: Thoracic;  Laterality: N/A;     OB History   No obstetric history on file.     No family history on file.  Social History   Tobacco Use  . Smoking status: Former Smoker    Packs/day: 0.50    Years: 30.00    Pack years: 15.00  Types: Cigarettes    Quit date: 05/27/2019    Years since quitting: 1.1  . Smokeless tobacco: Never Used  Vaping Use  . Vaping Use: Never used  Substance Use Topics  . Alcohol use: Yes    Alcohol/week: 84.0 standard drinks    Types: 84 Cans of beer per week  . Drug use: Yes    Types: Cocaine    Home Medications Prior to Admission medications   Medication Sig Start Date End Date Taking? Authorizing  Provider  acetaminophen (TYLENOL) 325 MG tablet Take 2 tablets (650 mg total) by mouth every 6 (six) hours as needed for mild pain, moderate pain or headache. 04/11/20  Yes Domenic Polite, MD  ferrous sulfate 325 (65 FE) MG tablet TAKE 1 TABLET (325 MG TOTAL) BY MOUTH 2 (TWO) TIMES DAILY WITH A MEAL. Patient taking differently: Take 325 mg by mouth 2 (two) times daily with a meal. 04/11/20 04/11/21 Yes Domenic Polite, MD  folic acid (FOLVITE) 1 MG tablet TAKE 1 TABLET (1 MG TOTAL) BY MOUTH DAILY. Patient taking differently: Take 1 mg by mouth daily. 04/11/20 04/11/21 Yes Domenic Polite, MD  gabapentin (NEURONTIN) 300 MG capsule Take 1 capsule (300 mg total) by mouth at bedtime. 06/11/20  Yes Tanner, Lyndon Code., PA-C  lidocaine-prilocaine (EMLA) cream Apply 1 application topically as needed. Patient taking differently: Apply 1 application topically as needed (port access). 10/19/19  Yes Heilingoetter, Cassandra L, PA-C  pantoprazole (PROTONIX) 40 MG tablet TAKE 1 TABLET (40 MG TOTAL) BY MOUTH 2 (TWO) TIMES DAILY. Patient taking differently: Take 40 mg by mouth 2 (two) times daily. 04/11/20 04/11/21 Yes Domenic Polite, MD  potassium chloride SA (KLOR-CON) 20 MEQ tablet TAKE 1 TABLET (20 MEQ TOTAL) BY MOUTH ONCE FOR 1 DOSE. Patient not taking: Reported on 07/01/2020 04/17/20 04/17/21  Curt Bears, MD  prochlorperazine (COMPAZINE) 10 MG tablet Take 1 tablet (10 mg total) by mouth every 6 (six) hours as needed for nausea or vomiting. Patient not taking: Reported on 04/08/2020 01/17/20 04/11/20  Heilingoetter, Cassandra L, PA-C    Allergies    Aspirin adult low [aspirin]  Review of Systems   Review of Systems  Constitutional: Negative for chills and fever.  HENT: Negative for congestion and rhinorrhea.   Eyes: Negative for redness and visual disturbance.  Respiratory: Negative for shortness of breath and wheezing.   Cardiovascular: Negative for chest pain and palpitations.  Gastrointestinal: Positive for  nausea and vomiting. Negative for abdominal pain.  Genitourinary: Negative for dysuria and urgency.  Musculoskeletal: Negative for arthralgias and myalgias.  Skin: Negative for pallor and wound.  Neurological: Negative for dizziness and headaches.    Physical Exam Updated Vital Signs BP 118/60   Pulse 80   Temp (!) 96.44 F (35.8 C) (Bladder)   Resp 18   Ht _0  (1.676 m)   Wt 52.9 kg   LMP 01/19/2011   SpO2 100%   BMI 18.82 kg/m   Physical Exam Vitals and nursing note reviewed.  Constitutional:      General: She is not in acute distress.    Appearance: She is well-developed. She is not diaphoretic.     Comments: Chronically ill-appearing.  HENT:     Head: Normocephalic and atraumatic.     Nose:     Comments: Dried blood in bilateral naris without obvious active bleeding Eyes:     Pupils: Pupils are equal, round, and reactive to light.  Cardiovascular:     Rate and Rhythm: Normal rate and  regular rhythm.     Heart sounds: No murmur heard. No friction rub. No gallop.   Pulmonary:     Effort: Pulmonary effort is normal.     Breath sounds: No wheezing or rales.  Abdominal:     General: There is no distension.     Palpations: Abdomen is soft.     Tenderness: There is no abdominal tenderness.  Musculoskeletal:        General: No tenderness.     Cervical back: Normal range of motion and neck supple.  Skin:    General: Skin is warm and dry.  Neurological:     Mental Status: She is alert and oriented to person, place, and time.  Psychiatric:        Behavior: Behavior normal.     ED Results / Procedures / Treatments   Labs (all labs ordered are listed, but only abnormal results are displayed) Labs Reviewed  MRSA PCR SCREENING - Abnormal; Notable for the following components:      Result Value   MRSA by PCR POSITIVE (*)    All other components within normal limits  BLOOD CULTURE ID PANEL (REFLEXED) - BCID2 - Abnormal; Notable for the following components:    Staphylococcus species DETECTED (*)    All other components within normal limits  LACTIC ACID, PLASMA - Abnormal; Notable for the following components:   Lactic Acid, Venous >11.0 (*)    All other components within normal limits  PROTIME-INR - Abnormal; Notable for the following components:   Prothrombin Time 16.3 (*)    INR 1.4 (*)    All other components within normal limits  URINALYSIS, ROUTINE W REFLEX MICROSCOPIC - Abnormal; Notable for the following components:   Color, Urine STRAW (*)    APPearance CLOUDY (*)    Glucose, UA 50 (*)    Hgb urine dipstick LARGE (*)    Protein, ur 100 (*)    RBC / HPF >50 (*)    Bacteria, UA FEW (*)    Non Squamous Epithelial 21-50 (*)    All other components within normal limits  COMPREHENSIVE METABOLIC PANEL - Abnormal; Notable for the following components:   Sodium 133 (*)    CO2 <7 (*)    Glucose, Bld 190 (*)    BUN 91 (*)    Creatinine, Ser 5.56 (*)    Calcium 6.7 (*)    Total Protein 4.4 (*)    Albumin 1.4 (*)    AST 68 (*)    GFR, Estimated 8 (*)    All other components within normal limits  LIPASE, BLOOD - Abnormal; Notable for the following components:   Lipase 55 (*)    All other components within normal limits  LACTIC ACID, PLASMA - Abnormal; Notable for the following components:   Lactic Acid, Venous >11.0 (*)    All other components within normal limits  CBC WITH DIFFERENTIAL/PLATELET - Abnormal; Notable for the following components:   WBC 0.7 (*)    RBC 0.63 (*)    Hemoglobin 2.4 (*)    HCT 7.9 (*)    MCV 125.4 (*)    MCH 38.1 (*)    Platelets <5 (*)    Neutro Abs 0.1 (*)    Lymphs Abs 0.6 (*)    All other components within normal limits  RAPID URINE DRUG SCREEN, HOSP PERFORMED - Abnormal; Notable for the following components:   Cocaine POSITIVE (*)    All other components within normal limits  GLUCOSE, CAPILLARY -  Abnormal; Notable for the following components:   Glucose-Capillary 152 (*)    All other components  within normal limits  URIC ACID - Abnormal; Notable for the following components:   Uric Acid, Serum 8.8 (*)    All other components within normal limits  CBC - Abnormal; Notable for the following components:   WBC 0.7 (*)    RBC 2.80 (*)    Hemoglobin 9.1 (*)    HCT 25.6 (*)    Platelets <5 (*)    All other components within normal limits  BASIC METABOLIC PANEL - Abnormal; Notable for the following components:   Sodium 133 (*)    Potassium 3.1 (*)    CO2 14 (*)    Glucose, Bld 105 (*)    BUN 94 (*)    Creatinine, Ser 4.59 (*)    Calcium 7.0 (*)    GFR, Estimated 11 (*)    All other components within normal limits  PHOSPHORUS - Abnormal; Notable for the following components:   Phosphorus 8.9 (*)    All other components within normal limits  GLUCOSE, CAPILLARY - Abnormal; Notable for the following components:   Glucose-Capillary 113 (*)    All other components within normal limits  PROTIME-INR - Abnormal; Notable for the following components:   Prothrombin Time 15.7 (*)    INR 1.3 (*)    All other components within normal limits  GLUCOSE, CAPILLARY - Abnormal; Notable for the following components:   Glucose-Capillary 101 (*)    All other components within normal limits  CBC - Abnormal; Notable for the following components:   WBC 0.5 (*)    RBC 2.41 (*)    Hemoglobin 7.7 (*)    HCT 21.7 (*)    Platelets 44 (*)    All other components within normal limits  BASIC METABOLIC PANEL - Abnormal; Notable for the following components:   Potassium 2.9 (*)    CO2 13 (*)    BUN 97 (*)    Creatinine, Ser 4.96 (*)    Calcium 6.6 (*)    GFR, Estimated 10 (*)    All other components within normal limits  CBC WITH DIFFERENTIAL/PLATELET - Abnormal; Notable for the following components:   WBC 0.4 (*)    RBC 2.25 (*)    Hemoglobin 7.3 (*)    HCT 20.2 (*)    MCHC 36.1 (*)    Platelets 37 (*)    All other components within normal limits  CBG MONITORING, ED - Abnormal; Notable for the  following components:   Glucose-Capillary 165 (*)    All other components within normal limits  CULTURE, BLOOD (SINGLE)  RESP PANEL BY RT-PCR (FLU A&B, COVID) ARPGX2  URINE CULTURE  CULTURE, BLOOD (ROUTINE X 2)  CULTURE, BLOOD (ROUTINE X 2)  COMPREHENSIVE METABOLIC PANEL  LIPASE, BLOOD  APTT  CBC WITH DIFFERENTIAL/PLATELET  HIV ANTIBODY (ROUTINE TESTING W REFLEX)  MAGNESIUM  MAGNESIUM  GLUCOSE, CAPILLARY  GLUCOSE, CAPILLARY  LACTIC ACID, PLASMA  APTT  GLUCOSE, CAPILLARY  HEMOGLOBIN A1C  CBC WITH DIFFERENTIAL/PLATELET  CBC WITH DIFFERENTIAL/PLATELET  CBC WITH DIFFERENTIAL/PLATELET  I-STAT CHEM 8, ED  TYPE AND SCREEN  PREPARE RBC (CROSSMATCH)  PREPARE RBC (CROSSMATCH)  PREPARE PLATELET PHERESIS  PREPARE PLATELET PHERESIS  PREPARE PLATELET PHERESIS    EKG EKG Interpretation  Date/Time:  Monday July 01 2020 11:57:51 EDT Ventricular Rate:  80 PR Interval:  149 QRS Duration: 105 QT Interval:  448 QTC Calculation: 517 R Axis:   66 Text  Interpretation: Sinus rhythm Prolonged QT interval Otherwise no significant change Confirmed by Deno Etienne [phone]) on 07/01/2020 12:15:19 PM   Radiology CT Head Wo Contrast  Result Date: 07/01/2020 CLINICAL DATA:  Recent seizure activity EXAM: CT HEAD WITHOUT CONTRAST TECHNIQUE: Contiguous axial images were obtained from the base of the skull through the vertex without intravenous contrast. COMPARISON:  04/07/2020 FINDINGS: Brain: Mild atrophic changes are noted. No findings to suggest acute hemorrhage, acute infarction or space-occupying mass lesion noted. Vascular: No hyperdense vessel or unexpected calcification. Skull: Normal. Negative for fracture or focal lesion. Sinuses/Orbits: No acute finding. Chronic soft tissue changes are noted in the left ethmoid sinuses. Other: None. IMPRESSION: Mild atrophic changes without acute abnormality. Electronically Signed   By: Inez Catalina M.D.   On: 07/01/2020 15:05   NM GI Blood Loss  Result  Date: 07/02/2020 CLINICAL DATA:  Acute lower GI bleeding, 3 episodes of large volume hematochezia. History of lung cancer EXAM: NUCLEAR MEDICINE GASTROINTESTINAL BLEEDING SCAN TECHNIQUE: Sequential abdominal images were obtained following intravenous administration of Tc-58mlabeled red blood cells. RADIOPHARMACEUTICALS:  22.7 mCi Tc-933mertechnetate in-vitro labeled red cells. COMPARISON:  05/21/2020 CT with contrast FINDINGS: Abnormal activity within the mid and lower abdomen extending into the upper pelvis and peristalsing throughout loops of bowel compatible with mild acute GI bleeding. Based on the pattern of peristalsis, it is difficult to determine location but small bowel is favored over colon. IMPRESSION: Positive exam for mild acute GI bleeding as above. These results will be called to the ordering clinician or representative by the Radiologist Assistant, and communication documented in the PACS or ClFrontier Oil CorporationElectronically Signed   By: M.Jerilynn Mages Shick M.D.   On: 07/02/2020 12:08   [place]  Result Date: 07/01/2020 CLINICAL DATA:  Acute kidney injury. EXAM: RENAL / URINARY TRACT ULTRASOUND COMPLETE COMPARISON:  CT abdomen pelvis dated May 21, 2020. FINDINGS: Right Kidney: Renal measurements: 10.0 x 4.2 x 5.0 cm = volume: 109 mL. Echogenicity within normal limits. No mass or hydronephrosis visualized. Left Kidney: Renal measurements: 9.1 x 5.1 x 5.3 cm = volume: 128 mL. Echogenicity within normal limits. No mass or hydronephrosis visualized. Other: Bilateral pleural effusions. IMPRESSION: 1. Normal renal ultrasound. 2. Bilateral pleural effusions. Electronically Signed   By: WiTitus Dubin.D.   On: 07/01/2020 15:55   DG Chest Port 1 View  Result Date: 07/01/2020 CLINICAL DATA:  Shortness of breath EXAM: PORTABLE CHEST 1 VIEW COMPARISON:  April 04, 2019 FINDINGS: Port-A-Cath tip is in the superior vena cava. No pneumothorax. There is a rather minimal right pleural effusion. No edema or  airspace opacity. Heart size and pulmonary vascularity are normal. No adenopathy. No bone lesions. IMPRESSION: Port-A-Cath as noted. Other minimal right pleural effusion. Lungs otherwise clear. Cardiac silhouette normal. Electronically Signed   By: WiLowella GripII M.D.   On: 07/01/2020 11:44    Procedures Procedures   Medications Ordered in ED Medications  lactated ringers infusion ( Intravenous Infusion Verify 07/02/20 0801)  docusate sodium (COLACE) capsule 100 mg (has no administration in time range)  polyethylene glycol (MIRALAX / GLYCOLAX) packet 17 g (has no administration in time range)  pantoprazole (PROTONIX) injection 40 mg (40 mg Intravenous Given 07/02/20 0928)  insulin aspart (novoLOG) injection 0-9 Units (0 Units Subcutaneous Not Given 07/02/20 1220)  Chlorhexidine Gluconate Cloth 2 % PADS 6 each (6 each Topical Given 07/02/20 1200)  mupirocin ointment (BACTROBAN) 2 % 1 application (1 application Nasal Given 07/02/20 0928)  Tbo-Filgrastim (GRANIX) injection  300 mcg (has no administration in time range)  magnesium sulfate IVPB 2 g 50 mL (2 g Intravenous New Bag/Given 07/02/20 1536)  sodium chloride 0.9 % bolus 1,000 mL (0 mLs Intravenous Stopped 07/01/20 1254)  lactated ringers bolus 1,000 mL (0 mLs Intravenous Stopped 07/01/20 1359)  ceFEPIme (MAXIPIME) 2 g in sodium chloride 0.9 % 100 mL IVPB (0 g Intravenous Stopped 07/01/20 1243)  vancomycin (VANCOCIN) IVPB 1000 mg/200 mL premix (0 mg Intravenous Stopped 07/01/20 1359)  lactated ringers bolus 1,000 mL (0 mLs Intravenous Stopped 07/01/20 1408)  0.9 %  sodium chloride infusion (Manually program via Guardrails IV Fluids) (0 mLs Intravenous Stopped 07/01/20 1700)  LORazepam (ATIVAN) tablet 0.5 mg (0.5 mg Oral Given 07/01/20 1353)  LORazepam (ATIVAN) injection 0.5 mg (0.5 mg Intravenous Given 07/01/20 1344)  levETIRAcetam (KEPPRA) IVPB 1000 mg/100 mL premix (0 mg Intravenous Stopped 07/01/20 1416)  lactated ringers bolus 1,000 mL (0 mLs  Intravenous Stopped 07/01/20 1541)  calcium chloride 1 g in sodium chloride 0.9 % 100 mL IVPB (0 g Intravenous Stopped 07/01/20 1700)  0.9 %  sodium chloride infusion (Manually program via Guardrails IV Fluids) (0 mLs Intravenous Stopped 07/01/20 1915)  cyanocobalamin ((VITAMIN B-12)) injection 1,000 mcg (1,000 mcg Intramuscular Given 9/82/64 1583)  folic acid 5 mg in sodium chloride 0.9 % 50 mL IVPB (0 mg Intravenous Stopped 07/01/20 1809)  potassium chloride 10 mEq in 50 mL *CENTRAL LINE* IVPB (0 mEq Intravenous Stopped 07/02/20 0900)  0.9 %  sodium chloride infusion (Manually program via Guardrails IV Fluids) (0 mLs Intravenous Stopped 07/02/20 0830)  technetium labeled red blood cells (ULTRATAG) injection kit 09.4 millicurie (07.6 millicuries Intravenous Contrast Given 07/02/20 1035)  0.9 %  sodium chloride infusion (Manually program via Guardrails IV Fluids) ( Intravenous New Bag/Given 07/02/20 1425)  calcium chloride 1 g in sodium chloride 0.9 % 100 mL IVPB (0 g Intravenous Stopped 07/02/20 1530)    ED Course  I have reviewed the triage vital signs and the nursing notes.  Pertinent labs & imaging results that were available during my care of the patient were reviewed by me and considered in my medical decision making (see chart for details).    MDM Rules/Calculators/A&P                          60 yo F with a chief complaints of nosebleeds shortness of breath and vomiting over the past couple days.  Feeling generally weak.  Borderline hypoxic with EMS.  Will obtain a chest x-ray blood work reassess.  Patient found to have a core temperature of 90.7.  Will give IV fluids treat with broad-spectrum antibiotics placed on the bair hugger.  Patient's metabolic panel has returned with acute renal failure and metabolic acidosis with anion gap.  Later this results was voided because it felt like it was too abnormal to post.  This was not discussed with me prior to voiding and demanding redraw.   Patient's lactate is greater than 11.  I-STAT Chem-8 also consistent with earlier labs with a hemoglobin less than 7 likely.  We will ordered 2 units of blood.  Lab is also requesting a redraw of the CBC as well as a repeat test to be sent for the blood bank.  I discussed the case with critical care as I was discussing the case with them my patient had a tonic-clonic seizure that lasted for about 30 seconds with some somnolence after.  Patient quickly back to  baseline.  Will give a dose of Ativan and Keppra.  Critical care to admit.   CRITICAL CARE Performed by: Cecilio Asper   Total critical care time: 115 minutes  Critical care time was exclusive of separately billable procedures and treating other patients.  Critical care was necessary to treat or prevent imminent or life-threatening deterioration.  Critical care was time spent personally by me on the following activities: development of treatment plan with patient and/or surrogate as well as nursing, discussions with consultants, evaluation of patient's response to treatment, examination of patient, obtaining history from patient or surrogate, ordering and performing treatments and interventions, ordering and review of laboratory studies, ordering and review of radiographic studies, pulse oximetry and re-evaluation of patient's condition.    Final Clinical Impression(s) / ED Diagnoses Final diagnoses:  AKI (acute kidney injury) ([place])  Encounter for antineoplastic chemotherapy    Rx / DC Orders ED Discharge Orders    None       Deno Etienne, DO 07/02/20 1611

## 2020-07-01 NOTE — ED Notes (Signed)
Pt placed on [facility]

## 2020-07-01 NOTE — Progress Notes (Signed)
Notified bedside nurse of need to draw repeat lactic acid. 

## 2020-07-01 NOTE — ED Notes (Signed)
Patient transported to CT 

## 2020-07-02 ENCOUNTER — Inpatient Hospital Stay (HOSPITAL_COMMUNITY): Payer: Medicaid Other

## 2020-07-02 DIAGNOSIS — C3491 Malignant neoplasm of unspecified part of right bronchus or lung: Secondary | ICD-10-CM | POA: Diagnosis not present

## 2020-07-02 DIAGNOSIS — B957 Other staphylococcus as the cause of diseases classified elsewhere: Secondary | ICD-10-CM

## 2020-07-02 DIAGNOSIS — K922 Gastrointestinal hemorrhage, unspecified: Secondary | ICD-10-CM

## 2020-07-02 DIAGNOSIS — R040 Epistaxis: Secondary | ICD-10-CM | POA: Diagnosis not present

## 2020-07-02 DIAGNOSIS — D61811 Other drug-induced pancytopenia: Secondary | ICD-10-CM | POA: Diagnosis not present

## 2020-07-02 DIAGNOSIS — D61818 Other pancytopenia: Secondary | ICD-10-CM

## 2020-07-02 DIAGNOSIS — C3411 Malignant neoplasm of upper lobe, right bronchus or lung: Secondary | ICD-10-CM

## 2020-07-02 DIAGNOSIS — K921 Melena: Secondary | ICD-10-CM

## 2020-07-02 DIAGNOSIS — G4089 Other seizures: Secondary | ICD-10-CM

## 2020-07-02 DIAGNOSIS — D708 Other neutropenia: Secondary | ICD-10-CM | POA: Diagnosis not present

## 2020-07-02 DIAGNOSIS — E872 Acidosis: Secondary | ICD-10-CM | POA: Diagnosis not present

## 2020-07-02 DIAGNOSIS — Z79899 Other long term (current) drug therapy: Secondary | ICD-10-CM

## 2020-07-02 DIAGNOSIS — T451X5A Adverse effect of antineoplastic and immunosuppressive drugs, initial encounter: Secondary | ICD-10-CM

## 2020-07-02 DIAGNOSIS — D62 Acute posthemorrhagic anemia: Secondary | ICD-10-CM | POA: Diagnosis not present

## 2020-07-02 DIAGNOSIS — N179 Acute kidney failure, unspecified: Secondary | ICD-10-CM | POA: Diagnosis not present

## 2020-07-02 DIAGNOSIS — R680 Hypothermia, not associated with low environmental temperature: Secondary | ICD-10-CM

## 2020-07-02 DIAGNOSIS — T827XXA Infection and inflammatory reaction due to other cardiac and vascular devices, implants and grafts, initial encounter: Secondary | ICD-10-CM

## 2020-07-02 LAB — CBC WITH DIFFERENTIAL/PLATELET
Abs Immature Granulocytes: 0 10*3/uL (ref 0.00–0.07)
Abs Immature Granulocytes: 0 10*3/uL (ref 0.00–0.07)
Abs Immature Granulocytes: 0.01 10*3/uL (ref 0.00–0.07)
Basophils Absolute: 0 10*3/uL (ref 0.0–0.1)
Basophils Absolute: 0 10*3/uL (ref 0.0–0.1)
Basophils Absolute: 0 10*3/uL (ref 0.0–0.1)
Basophils Relative: 0 %
Basophils Relative: 0 %
Basophils Relative: 0 %
Eosinophils Absolute: 0 10*3/uL (ref 0.0–0.5)
Eosinophils Absolute: 0 10*3/uL (ref 0.0–0.5)
Eosinophils Absolute: 0 10*3/uL (ref 0.0–0.5)
Eosinophils Relative: 0 %
Eosinophils Relative: 0 %
Eosinophils Relative: 0 %
HCT: 20.2 % — ABNORMAL LOW (ref 36.0–46.0)
HCT: 16.8 % — ABNORMAL LOW (ref 36.0–46.0)
HCT: 13.7 % — ABNORMAL LOW (ref 36.0–46.0)
Hemoglobin: 7.3 g/dL — ABNORMAL LOW (ref 12.0–15.0)
Hemoglobin: 6 g/dL — CL (ref 12.0–15.0)
Hemoglobin: 4.8 g/dL — CL (ref 12.0–15.0)
Immature Granulocytes: 0 %
Immature Granulocytes: 0 %
Immature Granulocytes: 2 %
Lymphocytes Relative: 91 %
Lymphocytes Relative: 85 %
Lymphocytes Relative: 91 %
Lymphs Abs: 0.4 10*3/uL — ABNORMAL LOW (ref 0.7–4.0)
Lymphs Abs: 0.3 10*3/uL — ABNORMAL LOW (ref 0.7–4.0)
Lymphs Abs: 0.4 10*3/uL — ABNORMAL LOW (ref 0.7–4.0)
MCH: 32.4 pg (ref 26.0–34.0)
MCH: 32.4 pg (ref 26.0–34.0)
MCH: 32.2 pg (ref 26.0–34.0)
MCHC: 36.1 g/dL — ABNORMAL HIGH (ref 30.0–36.0)
MCHC: 35.7 g/dL (ref 30.0–36.0)
MCHC: 35 g/dL (ref 30.0–36.0)
MCV: 89.8 fL (ref 80.0–100.0)
MCV: 90.8 fL (ref 80.0–100.0)
MCV: 91.9 fL (ref 80.0–100.0)
Monocytes Absolute: 0 10*3/uL — ABNORMAL LOW (ref 0.1–1.0)
Monocytes Absolute: 0 10*3/uL — ABNORMAL LOW (ref 0.1–1.0)
Monocytes Absolute: 0 10*3/uL — ABNORMAL LOW (ref 0.1–1.0)
Monocytes Relative: 2 %
Monocytes Relative: 3 %
Monocytes Relative: 2 %
Neutro Abs: 0 10*3/uL — CL (ref 1.7–7.7)
Neutro Abs: 0 10*3/uL — CL (ref 1.7–7.7)
Neutro Abs: 0 10*3/uL — CL (ref 1.7–7.7)
Neutrophils Relative %: 7 %
Neutrophils Relative %: 12 %
Neutrophils Relative %: 5 %
Platelets: 37 10*3/uL — ABNORMAL LOW (ref 150–400)
Platelets: 82 10*3/uL — ABNORMAL LOW (ref 150–400)
Platelets: 68 10*3/uL — ABNORMAL LOW (ref 150–400)
RBC: 2.25 MIL/uL — ABNORMAL LOW (ref 3.87–5.11)
RBC: 1.85 MIL/uL — ABNORMAL LOW (ref 3.87–5.11)
RBC: 1.49 MIL/uL — ABNORMAL LOW (ref 3.87–5.11)
RDW: 14.9 % (ref 11.5–15.5)
RDW: 15.2 % (ref 11.5–15.5)
RDW: 15.2 % (ref 11.5–15.5)
WBC: 0.4 10*3/uL — CL (ref 4.0–10.5)
WBC: 0.3 10*3/uL — CL (ref 4.0–10.5)
WBC: 0.4 10*3/uL — CL (ref 4.0–10.5)
nRBC: 0 % (ref 0.0–0.2)
nRBC: 0 % (ref 0.0–0.2)
nRBC: 0 % (ref 0.0–0.2)

## 2020-07-02 LAB — PREPARE PLATELET PHERESIS: Unit division: 0

## 2020-07-02 LAB — PHOSPHORUS: Phosphorus: 8.9 mg/dL — ABNORMAL HIGH (ref 2.5–4.6)

## 2020-07-02 LAB — BASIC METABOLIC PANEL
Anion gap: 14 (ref 5–15)
Anion gap: 13 (ref 5–15)
BUN: 94 mg/dL — ABNORMAL HIGH (ref 6–20)
BUN: 97 mg/dL — ABNORMAL HIGH (ref 6–20)
CO2: 14 mmol/L — ABNORMAL LOW (ref 22–32)
CO2: 13 mmol/L — ABNORMAL LOW (ref 22–32)
Calcium: 7 mg/dL — ABNORMAL LOW (ref 8.9–10.3)
Calcium: 6.6 mg/dL — ABNORMAL LOW (ref 8.9–10.3)
Chloride: 105 mmol/L (ref 98–111)
Chloride: 110 mmol/L (ref 98–111)
Creatinine, Ser: 4.59 mg/dL — ABNORMAL HIGH (ref 0.44–1.00)
Creatinine, Ser: 4.96 mg/dL — ABNORMAL HIGH (ref 0.44–1.00)
GFR, Estimated: 11 mL/min — ABNORMAL LOW (ref >60)
GFR, Estimated: 10 mL/min — ABNORMAL LOW (ref >60)
Glucose, Bld: 105 mg/dL — ABNORMAL HIGH (ref 70–99)
Glucose, Bld: 99 mg/dL (ref 70–99)
Potassium: 3.1 mmol/L — ABNORMAL LOW (ref 3.5–5.1)
Potassium: 2.9 mmol/L — ABNORMAL LOW (ref 3.5–5.1)
Sodium: 133 mmol/L — ABNORMAL LOW (ref 135–145)
Sodium: 136 mmol/L (ref 135–145)

## 2020-07-02 LAB — BLOOD CULTURE ID PANEL (REFLEXED) - BCID2

## 2020-07-02 LAB — URIC ACID: Uric Acid, Serum: 8.8 mg/dL — ABNORMAL HIGH (ref 2.5–7.1)

## 2020-07-02 LAB — CBC
HCT: 25.6 % — ABNORMAL LOW (ref 36.0–46.0)
HCT: 21.7 % — ABNORMAL LOW (ref 36.0–46.0)
Hemoglobin: 9.1 g/dL — ABNORMAL LOW (ref 12.0–15.0)
Hemoglobin: 7.7 g/dL — ABNORMAL LOW (ref 12.0–15.0)
MCH: 32.5 pg (ref 26.0–34.0)
MCH: 32 pg (ref 26.0–34.0)
MCHC: 35.5 g/dL (ref 30.0–36.0)
MCHC: 35.5 g/dL (ref 30.0–36.0)
MCV: 91.4 fL (ref 80.0–100.0)
MCV: 90 fL (ref 80.0–100.0)
Platelets: <5 10*3/uL — CL (ref 150–400)
Platelets: 44 10*3/uL — ABNORMAL LOW (ref 150–400)
RBC: 2.8 MIL/uL — ABNORMAL LOW (ref 3.87–5.11)
RBC: 2.41 MIL/uL — ABNORMAL LOW (ref 3.87–5.11)
RDW: 14.5 % (ref 11.5–15.5)
RDW: 14.8 % (ref 11.5–15.5)
WBC: 0.7 10*3/uL — CL (ref 4.0–10.5)
WBC: 0.5 10*3/uL — CL (ref 4.0–10.5)
nRBC: 0 % (ref 0.0–0.2)
nRBC: 0 % (ref 0.0–0.2)

## 2020-07-02 LAB — GLUCOSE, CAPILLARY
Glucose-Capillary: 113 mg/dL — ABNORMAL HIGH (ref 70–99)
Glucose-Capillary: 101 mg/dL — ABNORMAL HIGH (ref 70–99)
Glucose-Capillary: 85 mg/dL (ref 70–99)
Glucose-Capillary: 83 mg/dL (ref 70–99)
Glucose-Capillary: 103 mg/dL — ABNORMAL HIGH (ref 70–99)

## 2020-07-02 LAB — PROTIME-INR
INR: 1.3 — ABNORMAL HIGH (ref 0.8–1.2)
Prothrombin Time: 15.7 seconds — ABNORMAL HIGH (ref 11.4–15.2)

## 2020-07-02 LAB — APTT: aPTT: 31 seconds (ref 24–36)

## 2020-07-02 LAB — BPAM PLATELET PHERESIS
Blood Product Expiration Date: 202204112359
ISSUE DATE / TIME: 202204112008
Unit Type and Rh: 6200

## 2020-07-02 LAB — LACTIC ACID, PLASMA: Lactic Acid, Venous: 1 mmol/L (ref 0.5–1.9)

## 2020-07-02 LAB — MAGNESIUM: Magnesium: 1.8 mg/dL (ref 1.7–2.4)

## 2020-07-02 IMAGING — NM NM GI BLOOD LOSS
1 series · 6 of 6 positions shown · non-contrast
Comparison: [DATE] CT with contrast

CLINICAL DATA: Acute lower GI bleeding, 3 episodes of large volume
hematochezia. History of lung cancer

EXAM:
NUCLEAR MEDICINE GASTROINTESTINAL BLEEDING SCAN
TECHNIQUE: Sequential abdominal images were obtained following intravenous
administration of [UW] labeled red blood cells.
RADIOPHARMACEUTICALS:  22.7 mCi [UW] pertechnetate in-vitro
labeled red cells.

[Series 1: gi bleed hr1 · 3.28mm/px · 6 of 60 frames shown]
[frame 6/60]
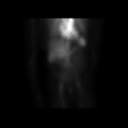
[frame 16/60]
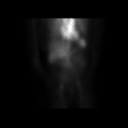
[frame 26/60]
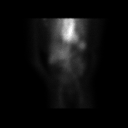
[frame 36/60]
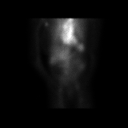
[frame 46/60]
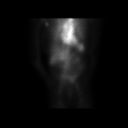
[frame 56/60]
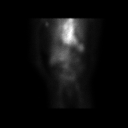

[6 of 6 positions shown; findings below may reference images not displayed]

FINDINGS: Abnormal activity within the mid and lower abdomen extending into
the upper pelvis and peristalsing throughout loops of bowel
compatible with mild acute GI bleeding. Based on the pattern of
peristalsis, it is difficult to determine location but small bowel
is favored over colon.
IMPRESSION: Positive exam for mild acute GI bleeding as above.

These results will be called to the ordering clinician or
representative by the Radiologist Assistant, and communication
documented in the PACS or [REDACTED].

## 2020-07-02 MED ORDER — SODIUM CHLORIDE 0.9% IV SOLUTION: Freq: Once | INTRAVENOUS | Status: DC

## 2020-07-02 MED ORDER — TBO-FILGRASTIM 300 MCG/0.5ML ~~LOC~~ SOSY
300.0000 ug | PREFILLED_SYRINGE | Freq: Every day | SUBCUTANEOUS | Status: AC
  Administered 2020-07-02 – 2020-07-09 (×8): 300 ug via SUBCUTANEOUS
  Filled 2020-07-02 (×9): qty 0.5

## 2020-07-02 MED ORDER — SODIUM CHLORIDE 0.9% IV SOLUTION: Freq: Once | INTRAVENOUS | Status: AC

## 2020-07-02 MED ORDER — TECHNETIUM TC 99M-LABELED RED BLOOD CELLS IV KIT
22.7000 | PACK | Freq: Once | INTRAVENOUS | Status: AC
  Administered 2020-07-02: 22.7 via INTRAVENOUS

## 2020-07-02 MED ORDER — ACETAMINOPHEN 325 MG PO TABS
650.0000 mg | ORAL_TABLET | Freq: Once | ORAL | Status: AC
  Administered 2020-07-02: 650 mg via ORAL
  Filled 2020-07-02: qty 2

## 2020-07-02 MED ORDER — MAGNESIUM SULFATE 2 GM/50ML IV SOLN
2.0000 g | Freq: Once | INTRAVENOUS | Status: AC
  Administered 2020-07-02: 2 g via INTRAVENOUS
  Filled 2020-07-02: qty 50

## 2020-07-02 MED ORDER — SODIUM CHLORIDE 0.9 % IV SOLN
1.0000 g | Freq: Once | INTRAVENOUS | Status: AC
  Administered 2020-07-02: 1 g via INTRAVENOUS
  Filled 2020-07-02: qty 10

## 2020-07-02 MED ORDER — POTASSIUM CHLORIDE 10 MEQ/50ML IV SOLN
10.0000 meq | INTRAVENOUS | Status: AC
  Administered 2020-07-02 (×2): 10 meq via INTRAVENOUS
  Filled 2020-07-02 (×2): qty 50

## 2020-07-02 NOTE — TOC Initial Note (Signed)
Transition of Care [facility]) - Initial/Assessment Note    Patient Details  Name: Brenda [name] MRN: [id number] Date of Birth: 1960/03/27  Transition of Care [facility]) CM/SW Contact:    Leeroy Cha, RN Phone Number: 07/02/2020, 7:53 AM  Clinical Narrative:                 60 year old woman with stage IV lung adenocarcinoma who received cycle 12 of pemetrexed and pembrolizumab 06/18/2020 who presented with epistaxis found to be in acute renal failure and pancytopenic.    Patient reportedly has short duration focal seizure and received Ativan after termination.  She is encephalopathic and cannot provide great history.  She presented to the ED reportedly per EMR with epistaxis.  During this work-up, labs were obtained which took a long time result.  Report the abnormal markedly cellular results were not published for some time.  Multiple repeat labs are ordered in order to establish some diagnoses.  She had a CT head that was negative for acute intracranial abnormality.  Her labs returned with new renal failure, creatinine 5.5, mild elevation in transaminases.  Her CBC was notable for a white count of 0.7, hemoglobin of 2.4, platelets undetectably low.  She is receiving blood and platelets.  Discussed with oncology provider on call and also going to give subcu B12 as well as high-dose IV folic acid.    Synopsis of assessment and plan:  Pancytopenia: High suspicion for pemetrexed toxicity as patient states she is not taking folic acid although not sure how coherent she is.  Notably, she had new development of small bilateral pleural effusions on the CT scan in of 3/22.  Wonder if this is accumulating this fluid and caused additional toxicity.  Concern for possible treatment related acute leukemia, but this is felt less likely at this time.  --4 units PRBC, 1 unit platelets ordered, recheck CBC after transfusions --Subcu B12, high-dose IV folic acid ordered  --Formal oncology consult in the morning     Acute renal failure: Likely related to pemetrexed toxicity as well as hypovolemia in the setting of severe anemia.  --Status post multiple liters of IV fluid bolus  --Continue maintenance fluids for now  --Supportive care as above  --Renal ultrasound within normal limits    Stage IV lung cancer: Stable status post 12 cycles pemetrexed and pembrolizumab.  Inferior right upper lobe nodule was enlarged on my interpretation as well as per radiology read 05/2020.  Notably, also developed new small bilateral pleural effusions.  --Oncology consult as above    Seizure: Unclear etiology.  Possible anoxia seizure.  CT head without acute bleed or obvious metastasis.  --MRI brain ordered    Lactic acidosis: Suspect related to reduced oxygen carrying capacity leading to anaerobic metabolism.  No hypotension.  No source of infection.  Chest x-ray clear, UA without leukocytes, nitrites, mild pyuria.  --Status post cefepime and vancomycin  --Follow cultures  --Hold additional antibiotics for now  Black stools: History of GI bleed in the past.  Suspect related to swallowing epistaxis over the last couple days.  --IV PPI  --Consider GI consult in the morning  One unit nof ppp GIVEN FOR PLATELET COUNT OF <5, WCB-0.7, T&cm FOR ONE UNIT, HGB-9.1, TEMP THIS AM IS 97.34, IV LR AT 125. PLAN: UNDETERMINED AT THIS TIME MAY NEED [place] OR HOME HOSPICE. Expected Discharge Plan: [place] Barriers to Discharge: Continued Medical Work up   Patient Goals and CMS Choice Patient states their goals for  this hospitalization and ongoing recovery are:: to go home CMS Medicare.gov Compare Post Acute Care list provided to:: Patient Choice offered to / list presented to : Patient  Expected Discharge Plan and Services Expected Discharge Plan: [place]   Discharge Planning Services: CM Consult   Living arrangements for the past 2 months: Single Family Home                                       Prior Living Arrangements/Services Living arrangements for the past 2 months: Single Family Home Lives with:: Self Patient language and need for interpreter reviewed:: Yes Do you feel safe going back to the place where you live?: Yes      Need for Family Participation in Patient Care: Yes (Comment) Care giver support system in place?: Yes (comment)   Criminal Activity/Legal Involvement Pertinent to Current Situation/Hospitalization: No - Comment as needed  Activities of Daily Living Home Assistive Devices/Equipment: Other (Comment) (pt unable to answer) ADL Screening (condition at time of admission) Patient's cognitive ability adequate to safely complete daily activities?: No Is the patient deaf or have difficulty hearing?: No Does the patient have difficulty seeing, even when wearing glasses/contacts?: No Does the patient have difficulty concentrating, remembering, or making decisions?: Yes Patient able to express need for assistance with ADLs?: Yes Does the patient have difficulty dressing or bathing?: Yes Independently performs ADLs?: No Communication: Independent Dressing (OT): Needs assistance Is this a change from baseline?: Change from baseline, expected to last >3 days Grooming: Needs assistance Is this a change from baseline?: Change from baseline, expected to last >3 days Feeding: Needs assistance Is this a change from baseline?: Change from baseline, expected to last >3 days Bathing: Needs assistance Is this a change from baseline?: Change from baseline, expected to last >3 days Toileting: Needs assistance Is this a change from baseline?: Change from baseline, expected to last >3days In/Out Bed: Needs assistance Is this a change from baseline?: Change from baseline, expected to last >3 days Walks in Home: Needs assistance Is this a change from baseline?: Change from baseline, expected to last >3 days Does the patient have difficulty walking or  climbing stairs?: Yes Weakness of Legs: Both Weakness of Arms/Hands: Both  Permission Sought/Granted                  Emotional Assessment Appearance:: Appears stated age Attitude/Demeanor/Rapport: Engaged Affect (typically observed): Quiet Orientation: : Oriented to Self,Oriented to Place,Oriented to  Time,Oriented to Situation Alcohol / Substance Use: Not Applicable Psych Involvement: No (comment)  Admission diagnosis:  Renal failure [N19] AKI (acute kidney injury) ([place]) [N17.9] Patient Active Problem List   Diagnosis Date Noted  . Renal failure 07/01/2020  . AKI (acute kidney injury) ([place])   . Metabolic acidosis   . Epistaxis   . Seizure-like activity ([place])   . Swelling of lower extremity 05/29/2020  . Hypoalbuminemia 05/29/2020  . Acute on chronic anemia 04/07/2020  . Acute upper GI bleed 04/07/2020  . Acute encephalopathy 04/07/2020  . Pancytopenia ([place]) 04/07/2020  . Polysubstance abuse ([place]) 04/07/2020  . Hypokalemia 10/19/2019  . Neutropenia ([place]) 10/05/2019  . Malignant neoplasm of right lung ([place]) 09/14/2019  . Encounter for antineoplastic chemotherapy 09/14/2019  . Encounter for antineoplastic immunotherapy 09/14/2019  . Goals of care, counseling/discussion 09/14/2019  . Tobacco abuse counseling 09/14/2019  . 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 Alcohol  abuse   . Acute respiratory failure ([place]) 04/03/2019  . Lung mass   . Multifocal pneumonia   . Major depressive disorder   . Major depressive disorder, recurrent episode with mood-congruent psychotic features ([place]) 05/30/2017  . 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 [place] [address] Phone: [phone] Fax: [phone]     Social Determinants of Health (SDOH) Interventions    Readmission Risk Interventions Readmission Risk Prevention Plan 04/11/2020  Transportation  Screening Complete  PCP or Specialist Appt within 3-5 Days Complete  HRI or [place] Complete  Social Work Consult for [place] Planning/Counseling Complete  Palliative Care Screening Not Applicable  Medication Review Press photographer) Complete  Some recent data might be hidden

## 2020-07-02 NOTE — Progress Notes (Signed)
Urgently consulted in view of hematochezia in the setting of profound pancytopenia and AKI. Has received PRBC's and plts.  Record reviewed; will be coming in shortly to see patient.  Have spoken w/ ICU nurse; pt has had 2 episodes of bleeding (by her description, sounds like roughly 250 and 750 mL of clots) but is not overtly shocky.  EGD 03/2020 was neg for specific source of bleeding at that time, and recent CTA/P neg for diverticulosis.  Unfortunately, pt remains profoundly thrombocytopenic  (plt < 5K), AFTER receiving a unit of platelets.  WBC remains less than 1,000 (as of yesterday, ANC was 100; diff from today is pending).  My initial impression is that this patient probably has diffuse mucosal GI hemorrhage and does not have a "point source" of bleeding so endoscopic or colonoscopic evaluation, or even dropping an NG tube for diagnostic gastric lavage, is not likely to be helpful diagnostically or therapeutically.    Moreover, until counts have been improved to a reasonable level, egd or colonoscopy would have an unacceptable risk of causing infection and/or further bleeding unless she is having exsanguinating acute bleeding that is felt possibly to be coming from a localized source.  Agree with plan to obtain IR consult, but unfortunately, in the setting of AKI, arteriography would likely be tantamount to consigning pt to permanent dialysis.  Nuclear medicine bleeding scan may be helpful once patient is stabilized to see if a localized source of bleeding can be defined.  Highest priority is correction of counts, especially platelets. Have spoken w/ Dr. Lucile Shutters who indicates they are working on that.  Cleotis Nipper, M.D. Pager [phone] If no answer or after 5 PM call [phone]

## 2020-07-02 NOTE — Progress Notes (Signed)
[facility] Progress Note Patient Name: Brenda [name] DOB: 02/18/1961 MRN: [id number]   Date of Service  07/02/2020  HPI/Events of Note    eICU Interventions  NM tagged RBC scan ordered.        Kerry Kass Tamantha Saline 07/02/2020, 6:10 AM

## 2020-07-02 NOTE — Progress Notes (Addendum)
NAME:  Brenda [name], MRN:  [id number], DOB:  1961-02-20, LOS: 1 ADMISSION DATE:  07/01/2020, CONSULTATION DATE:  07/02/20 REFERRING MD:  EDP, CHIEF COMPLAINT:  Epistaxis and dark stools   History of Present Illness:   Brenda [name] is a 60 y.o. F with PMH of stage IV non-small cell lung cancer, adenocarcinoma of right lung, depression who is currently undergoing active chemotherapy by Dr. Earlie Server.  Today, she presented with epistaxis and a dark black stools and generalized weakness for the last few days.  BP on arrival as 105/66 without tachycardia.  She has been hypothermic 80 7.65F.  CXR with Port-A-Cath in place and minimal right pleural effusion but otherwise clear.  Her lactic acid was > 11, with INR 1.4 and AKI of creatinine over 5 without hyperkalemia and metabolic acidosis.  Her CBC has been difficult for lab to read, repeat is pending.  2 units of PRBCs were ordered and she was given 3 L IV fluid, cefepime, vancomycin.  During ED course, she became more somnolent and had focal seizure-like activity with eyes rolling back in her head.  She was given Ativan and Keppra.  Head CT ordered.  Warm fluids and Bair hugger initiated to treat hypothermia.   Chemotherapy was initiated 7/821 for 11 cycles and current regimen of with carboplatin for an AUC of 5, Alimta 500 mg/m2, and Keytruda 200 mg IV every 3 weeks.  Alimta occasionally held due to renal insufficiency and neuropenia.     Hgb later resulted 2.4 with platelets less than 5K.  She was given 4 units PRBCs and platelets overnight with hemoglobin improvement to 9.4.  Pertinent  Medical History   has a past medical history of Breast cancer ([place]), Depression, and Mental disorder.  Stage IV non-small cell lung Ca dx'd May 2021   Significant Hospital Events: Including procedures, antibiotic start and stop dates in addition to other pertinent events   . 07/02/19 admit, seizure activity start vancomycin and cefepime . 4/12 remains stable,  plan for tagged RBC scan    Micro: 4/11 Covid-19>> negative 4/11 MRSA>> positive 4/11 blood culture 1/2>> gram-positive cocci in clusters>> 4/11 urine culture>>   Antibiotics: Vancomycin 4/11- Cefepime 4/11-  Interim History / Subjective:   Multiple episodes of hematochezia overnight, stat GI consult was obtained, recommend correcting platelets and tagged RBC scan. She is more awake this morning, conversational and denies pain.  Patient is not on pressors  Objective   Blood pressure 110/66, pulse 81, temperature (!) 96.26 F (35.7 C), resp. rate 19, height 5\' 6"  (1.676 m), weight 52.9 kg, last menstrual period 01/19/2011, SpO2 100 %.        Intake/Output Summary (Last 24 hours) at 07/02/2020 4944 Last data filed at 07/02/2020 0801 Gross per 24 hour  Intake 4562.34 ml  Output 201 ml  Net 4361.34 ml   Filed Weights   07/01/20 1635 07/02/20 0315  Weight: 52.6 kg 52.9 kg    General: Chronically ill-appearing female, though awake and interactive this morning HEENT: MM pink/moist, no further epistaxis Neuro: Awake, conversational, oriented to place and situation CV: s1s2 RRR, no m/r/g PULM: Clear bilaterally on room air, no respiratory distress GI: soft, bsx4 active, nontender Extremities: warm/dry, no edema  Skin: no rashes or lesions    Labs/imaging that I havepersonally reviewed  (right click and "Reselect all SmartList Selections" daily)  4/11 CXR>> no evidence of infiltrate, CMP, PT/INR, lactic acid  Resolved Hospital Problem list     Assessment & Plan:  60 year old  female with stage IV lung cancer who presented with epistaxis fatigue and melena and found to have acute kidney injury with seizure activity in the ED along with hypothermia and tachycardia.  Profound ABLA and pancytopenia on initial CBC   ABLA secondary to GIB, profound pancytopenia Initial hemoglobin 2.4 with platelets< 5k, hemodynamically stable Thrombocytopenia possibly precipitated by  Pemetrexe, patient also cocaine positive He had large volume hematochezia overnight so stat GI consult was obtained  GI bleed with erosive gastropathy and gastritis and was admitted 03/2020. P: -Hemoglobin improved to 9 after 4 units PRBCs, transfused 2 units platelets.  Repeat CBC pending,not requiring pressors -Seen by Dr. Cristina Gong with GI overnight-suspect diffuse mucosal oozing as a consequence of thrombocytopenia.  Not a good candidate for endoscopy or colonoscopy at this time secondary to thrombocytopenia. -Plan for nuclear med bleeding scan -Continue Protonix, no role for octreotide -Serial blood counts, transfuse as needed    Acute kidney injury, metabolic acidosis, hypokalemia Creatinine on admission 5.5, baseline 1.0-1.3, suspect this may be secondary to chemotherapy in combination with hypovolemia or ATN secondary to sepsis.  Plan: -Slightly improved to this morning 200 cc urine overnight -Renal ultrasound unremarkable -Replete K -Continue to follow renal indices and electrolytes, avoid nephrotoxins as able, monitor I's and O's     Seizure-like activity -Eyes rolled back with decreased responsiveness, also has a history of cocaine abuse and confusion during prior admissions P: -Head CT negative and markedly improved today -Suspect this episode was secondary to profound acute blood loss in the setting of cocaine -Continue seizure precautions -If recurs would likely need EEG and neuro consult    Possible sepsis-hypothermia, tachycardia, unclear source Temp as low as 80 70F, chest x-ray without infiltrate, UA pending Lactic acid markedly improved P: -Lower suspicion sepsis today -Continue to follow blood and urine cultures, continue vancomycin and cefepime -Follow blood and urine cultures, continue broad-spectrum antibiotics with vancomycin and ceftriaxone adding of severe neutropenia -Initial BC now showing Gm + cocci -Received 30 cc/kg IV fluid -No hypotension,  monitor to maintain MAP>65 -TSH checked in the last 2 weeks and within normal limits    Hyperglycemia -continue SSI, A1c is pending    Stage IV adenocarcinoma right lung, non-small cell lung cancer Following with Dr. Julien Nordmann Current regimen of with carboplatin for an AUC of 5, Alimta 500 mg/m2, and Keytruda 200 mg IV every 3 weeks, last treatment 3/29 with Alimta and Keytruda P: -Oncology consulted and to see today -received high dose Z85 and Folic Acid    Best practice (right click and "Reselect all SmartList Selections" daily)  Diet:  NPO Pain/Anxiety/Delirium protocol (if indicated): No VAP protocol (if indicated): Not indicated DVT prophylaxis: SCD GI prophylaxis: PPI Glucose control:  SSI Yes Central venous access:  N/A Arterial line:  N/A Foley:  N/A Mobility:  bed rest  PT consulted: N/A Last date of multidisciplinary goals of care discussion: due 4/18 Code Status:  full code Disposition: ICU  Labs   CBC: Recent Labs  Lab 07/01/20 1320 07/01/20 1459 07/02/20 0308  WBC UNABLE TO OBTAIN INSTRUMENT VALUE DUE TO SPECIMEN INTEGRITY. 0.7* 0.7*  NEUTROABS  --  0.1*  --   HGB UNABLE TO OBTAIN INSTRUMENT VALUE DUE TO SPECIMEN INTEGRITY. 2.4* 9.1*  HCT UNABLE TO OBTAIN INSTRUMENT VALUE DUE TO SPECIMEN INTEGRITY. 7.9* 25.6*  MCV UNABLE TO OBTAIN INSTRUMENT VALUE DUE TO SPECIMEN INTEGRITY. 125.4* 91.4  PLT UNABLE TO OBTAIN INSTRUMENT VALUE DUE TO SPECIMEN INTEGRITY. <5* <5*    Basic Metabolic  Panel: Recent Labs  Lab 07/01/20 1155 07/01/20 1320 07/02/20 0308  NA QUESTIONABLE RESULTS, RECOMMEND RECOLLECT TO VERIFY 133* 133*  K QUESTIONABLE RESULTS, RECOMMEND RECOLLECT TO VERIFY 3.9 3.1*  CL QUESTIONABLE RESULTS, RECOMMEND RECOLLECT TO VERIFY 104 105  CO2 QUESTIONABLE RESULTS, RECOMMEND RECOLLECT TO VERIFY <7* 14*  GLUCOSE QUESTIONABLE RESULTS, RECOMMEND RECOLLECT TO VERIFY 190* 105*  BUN QUESTIONABLE RESULTS, RECOMMEND RECOLLECT TO VERIFY 91* 94*  CREATININE  QUESTIONABLE RESULTS, RECOMMEND RECOLLECT TO VERIFY 5.56* 4.59*  CALCIUM QUESTIONABLE RESULTS, RECOMMEND RECOLLECT TO VERIFY 6.7* 7.0*  MG  --  2.1 1.8  PHOS  --   --  8.9*   GFR: Estimated Creatinine Clearance: 11.3 mL/min (A) (by C-G formula based on SCr of 4.59 mg/dL (H)). Recent Labs  Lab 07/01/20 1151 07/01/20 1320 07/01/20 1416 07/01/20 1459 07/02/20 0308  WBC  --  UNABLE TO OBTAIN INSTRUMENT VALUE DUE TO SPECIMEN INTEGRITY.  --  0.7* 0.7*  LATICACIDVEN >11.0*  --  >11.0*  --  1.0    Liver Function Tests: Recent Labs  Lab 07/01/20 1155 07/01/20 1320  AST QUESTIONABLE RESULTS, RECOMMEND RECOLLECT TO VERIFY 68*  ALT QUESTIONABLE RESULTS, RECOMMEND RECOLLECT TO VERIFY 32  ALKPHOS QUESTIONABLE RESULTS, RECOMMEND RECOLLECT TO VERIFY 62  BILITOT QUESTIONABLE RESULTS, RECOMMEND RECOLLECT TO VERIFY 0.5  PROT QUESTIONABLE RESULTS, RECOMMEND RECOLLECT TO VERIFY 4.4*  ALBUMIN QUESTIONABLE RESULTS, RECOMMEND RECOLLECT TO VERIFY 1.4*   Recent Labs  Lab 07/01/20 1155 07/01/20 1320  LIPASE QUESTIONABLE RESULTS, RECOMMEND RECOLLECT TO VERIFY 55*   No results for input(s): AMMONIA in the last 168 hours.  ABG    Component Value Date/Time   HCO3 25.2 04/08/2020 1305   O2SAT 19.0 04/08/2020 1305     Coagulation Profile: Recent Labs  Lab 07/01/20 1156 07/02/20 0452  INR 1.4* 1.3*    Cardiac Enzymes: No results for input(s): CKTOTAL, CKMB, CKMBINDEX, TROPONINI in the last 168 hours.  HbA1C: No results found for: HGBA1C  CBG: Recent Labs  Lab 07/01/20 1648 07/01/20 1957 07/01/20 2348 07/02/20 0405 07/02/20 0743  GLUCAP 152* 91 96 113* 101*    Review of Systems:   Review of Systems -difficult to obtain secondary to decreased mental status   Past Medical History:  She,  has a past medical history of Breast cancer ([place]), Depression, and Mental disorder.   Surgical History:   Past Surgical History:  Procedure Laterality Date  . ESOPHAGOGASTRODUODENOSCOPY  (EGD) WITH PROPOFOL N/A 04/09/2020   Procedure: ESOPHAGOGASTRODUODENOSCOPY (EGD) WITH PROPOFOL;  Surgeon: Doran Stabler, MD;  Location: [facility];  Service: Gastroenterology;  Laterality: N/A;  . IR IMAGING GUIDED PORT INSERTION  11/03/2019  . TUBAL LIGATION    . VIDEO BRONCHOSCOPY WITH ENDOBRONCHIAL NAVIGATION N/A 04/04/2019   Procedure: VIDEO BRONCHOSCOPY WITH ENDOBRONCHIAL NAVIGATION;  Surgeon: Garner Nash, DO;  Location: [facility];  Service: Thoracic;  Laterality: N/A;  . VIDEO BRONCHOSCOPY WITH ENDOBRONCHIAL ULTRASOUND N/A 04/04/2019   Procedure: VIDEO BRONCHOSCOPY WITH ENDOBRONCHIAL ULTRASOUND;  Surgeon: Garner Nash, DO;  Location: [facility];  Service: Thoracic;  Laterality: N/A;     Social History:   reports that she quit smoking about 13 months ago. Her smoking use included cigarettes. She has a 15.00 pack-year smoking history. She has never used smokeless tobacco. She reports current alcohol use of about 84.0 standard drinks of alcohol per week. She reports current drug use. Drug: Cocaine.   Family History:  Her family history is not on file.   Allergies Allergies  Allergen Reactions  .  Aspirin Adult Low [Aspirin] Other (See Comments)    Stomach upset     Home Medications  Prior to Admission medications   Medication Sig Start Date End Date Taking? Authorizing Provider  acetaminophen (TYLENOL) 325 MG tablet Take 2 tablets (650 mg total) by mouth every 6 (six) hours as needed for mild pain, moderate pain or headache. 04/11/20  Yes Domenic Polite, MD  ferrous sulfate 325 (65 FE) MG tablet TAKE 1 TABLET (325 MG TOTAL) BY MOUTH 2 (TWO) TIMES DAILY WITH A MEAL. Patient taking differently: Take 325 mg by mouth 2 (two) times daily with a meal. 04/11/20 04/11/21 Yes Domenic Polite, MD  folic acid (FOLVITE) 1 MG tablet TAKE 1 TABLET (1 MG TOTAL) BY MOUTH DAILY. Patient taking differently: Take 1 mg by mouth daily. 04/11/20 04/11/21 Yes Domenic Polite, MD  gabapentin (NEURONTIN) 300  MG capsule Take 1 capsule (300 mg total) by mouth at bedtime. 06/11/20  Yes Tanner, Lyndon Code., PA-C  lidocaine-prilocaine (EMLA) cream Apply 1 application topically as needed. Patient taking differently: Apply 1 application topically as needed (port access). 10/19/19  Yes Heilingoetter, Cassandra L, PA-C  pantoprazole (PROTONIX) 40 MG tablet TAKE 1 TABLET (40 MG TOTAL) BY MOUTH 2 (TWO) TIMES DAILY. Patient taking differently: Take 40 mg by mouth 2 (two) times daily. 04/11/20 04/11/21 Yes Domenic Polite, MD  potassium chloride SA (KLOR-CON) 20 MEQ tablet TAKE 1 TABLET (20 MEQ TOTAL) BY MOUTH ONCE FOR 1 DOSE. Patient not taking: Reported on 07/01/2020 04/17/20 04/17/21  Curt Bears, MD  prochlorperazine (COMPAZINE) 10 MG tablet Take 1 tablet (10 mg total) by mouth every 6 (six) hours as needed for nausea or vomiting. Patient not taking: Reported on 04/08/2020 01/17/20 04/11/20  Heilingoetter, Cassandra L, PA-C     Critical care time: 35 minutes     CRITICAL CARE Performed by: Otilio Carpen Aradia Estey   Total critical care time: 35 minutes  Critical care time was exclusive of separately billable procedures and treating other patients.  Critical care was necessary to treat or prevent imminent or life-threatening deterioration.  Critical care was time spent personally by me on the following activities: development of treatment plan with patient and/or surrogate as well as nursing, discussions with consultants, evaluation of patient's response to treatment, examination of patient, obtaining history from patient or surrogate, ordering and performing treatments and interventions, ordering and review of laboratory studies, ordering and review of radiographic studies, pulse oximetry and re-evaluation of patient's condition.  Otilio Carpen Jasen Hartstein, PA-C [facility] See Amion for pager If no response to pager , please call [phone] until 7pm After 7:00 pm call [place]  [phone]?[place]

## 2020-07-02 NOTE — Progress Notes (Signed)
eLink Physician-Brief Progress Note Patient Name: Brenda [name] DOB: [birth year] MRN: [id number]   Date of Service  07/02/2020  HPI/Events of Note  Hgb = 4.8 - Presently getting 1 unit PRBC for Hgb = 6.0.  eICU Interventions  Will transfuse another 2 units PRBC now. Continue to monitor CBC Q 4 hours.      Intervention Category Major Interventions: Hemorrhage - evaluation and management  Twanda Stakes Cornelia Copa 07/02/2020, 11:19 PM

## 2020-07-02 NOTE — Progress Notes (Addendum)
HEMATOLOGY-ONCOLOGY PROGRESS NOTE  SUBJECTIVE: The patient presented to the hospital secondary to epistaxis and dark black stools.  On admission, she was hypothermic.  Lactic acid was elevated and she was found to have AKI and pancytopenia.  In the emergency department, she became more somnolent and had focal seizure-like activity and was started on Ativan and Keppra.  CT of the head negative for acute abnormality.  The patient has been seen by GI and they have started her on IV Protonix and have recommended a nuclear med bleeding scan.  Endoscopy not planned due to pancytopenia.  Initial blood cultures showing gram-positive cocci.  So far this admission, she has received 4 units PRBCs and 2 units of platelets.  This morning, she is laying in the bed and feels comfortable.  Had hematochezia overnight.  She has not complained of any chest pain or shortness of breath today.  Oncology History  Malignant neoplasm of right lung ([place])  09/14/2019 Initial Diagnosis   Adenocarcinoma of right lung, stage 4 ([place])   09/28/2019 -  Chemotherapy    Patient is on Treatment Plan: LUNG CARBOPLATIN / PEMETREXED / PEMBROLIZUMAB Q21D INDUCTION X 4 CYCLES / MAINTENANCE PEMETREXED + PEMBROLIZUMAB      04/17/2020 Cancer Staging   Staging form: Lung, AJCC 8th Edition - Clinical: Stage IVA (cT2a, cN2, cM1b) - Signed by Curt Bears, MD on 04/17/2020      REVIEW OF SYSTEMS:   Constitutional: Denies fevers, chills Eyes: Denies blurriness of vision Ears, nose, mouth, throat, and face: Denies mucositis or sore throat Respiratory: Denies cough, dyspnea or wheezes Cardiovascular: Denies palpitation, chest discomfort Gastrointestinal: Denies nausea vomiting.  Reports dark stools. Skin: Denies abnormal skin rashes Lymphatics: Denies new lymphadenopathy or easy bruising Neurological:Denies numbness, tingling or new weaknesses Behavioral/Psych: Mood is stable, no new changes  Extremities: No lower extremity edema All  other systems were reviewed with the patient and are negative.  I have reviewed the past medical history, past surgical history, social history and family history with the patient and they are unchanged from previous note.   PHYSICAL EXAMINATION: ECOG PERFORMANCE STATUS: 2 - Symptomatic, <50% confined to bed  Vitals:   07/02/20 0700 07/02/20 0730  BP: 113/60 110/66  Pulse: 73 81  Resp: 17 19  Temp: (!) 96.44 F (35.8 C) (!) 96.26 F (35.7 C)  SpO2: 100% 100%   Filed Weights   07/01/20 1635 07/02/20 0315  Weight: 52.6 kg 52.9 kg    Intake/Output from previous day: 04/11 0701 - 04/12 0700 In: 4069.1 [I.V.:1746.2; Blood:2308.8; IV Piggyback:14.1] Out: 201 [Urine:200; Stool:1]  GENERAL:alert, no distress and comfortable SKIN: skin color, texture, turgor are normal, no rashes or significant lesions EYES: normal, Conjunctiva are pink and non-injected, sclera clear OROPHARYNX:no exudate, no erythema and lips, buccal mucosa, and tongue normal  LUNGS: clear to auscultation and percussion with normal breathing effort HEART: regular rate & rhythm and no murmurs and no lower extremity edema ABDOMEN:abdomen soft, non-tender and normal bowel sounds NEURO: alert & oriented x 3 with fluent speech, no focal motor/sensory deficits  LABORATORY DATA:  I have reviewed the data as listed CMP Latest Ref Rng & Units 07/02/2020 07/01/2020 07/01/2020  Glucose 70 - 99 mg/dL 105(H) 190(H) QUESTIONABLE RESULTS, RECOMMEND RECOLLECT TO VERIFY  BUN 6 - 20 mg/dL 94(H) 91(H) QUESTIONABLE RESULTS, RECOMMEND RECOLLECT TO VERIFY  Creatinine 0.44 - 1.00 mg/dL 4.59(H) 5.56(H) QUESTIONABLE RESULTS, RECOMMEND RECOLLECT TO VERIFY  Sodium 135 - 145 mmol/L 133(L) 133(L) QUESTIONABLE RESULTS, RECOMMEND RECOLLECT TO VERIFY  Potassium 3.5 - 5.1 mmol/L 3.1(L) 3.9 QUESTIONABLE RESULTS, RECOMMEND RECOLLECT TO VERIFY  Chloride 98 - 111 mmol/L 105 104 QUESTIONABLE RESULTS, RECOMMEND RECOLLECT TO VERIFY  CO2 22 - 32 mmol/L  14(L) <7(L) QUESTIONABLE RESULTS, RECOMMEND RECOLLECT TO VERIFY  Calcium 8.9 - 10.3 mg/dL 7.0(L) 6.7(L) QUESTIONABLE RESULTS, RECOMMEND RECOLLECT TO VERIFY  Total Protein 6.5 - 8.1 g/dL - 4.4(L) QUESTIONABLE RESULTS, RECOMMEND RECOLLECT TO VERIFY  Total Bilirubin 0.3 - 1.2 mg/dL - 0.5 QUESTIONABLE RESULTS, RECOMMEND RECOLLECT TO VERIFY  Alkaline Phos 38 - 126 U/L - 62 QUESTIONABLE RESULTS, RECOMMEND RECOLLECT TO VERIFY  AST 15 - 41 U/L - 68(H) QUESTIONABLE RESULTS, RECOMMEND RECOLLECT TO VERIFY  ALT 0 - 44 U/L - 32 QUESTIONABLE RESULTS, RECOMMEND RECOLLECT TO VERIFY    Lab Results  Component Value Date   WBC 0.7 (LL) 07/02/2020   HGB 9.1 (L) 07/02/2020   HCT 25.6 (L) 07/02/2020   MCV 91.4 07/02/2020   PLT <5 (LL) 07/02/2020   NEUTROABS 0.1 (LL) 07/01/2020    CT Head Wo Contrast  Result Date: 07/01/2020 CLINICAL DATA:  Recent seizure activity EXAM: CT HEAD WITHOUT CONTRAST TECHNIQUE: Contiguous axial images were obtained from the base of the skull through the vertex without intravenous contrast. COMPARISON:  04/07/2020 FINDINGS: Brain: Mild atrophic changes are noted. No findings to suggest acute hemorrhage, acute infarction or space-occupying mass lesion noted. Vascular: No hyperdense vessel or unexpected calcification. Skull: Normal. Negative for fracture or focal lesion. Sinuses/Orbits: No acute finding. Chronic soft tissue changes are noted in the left ethmoid sinuses. Other: None. IMPRESSION: Mild atrophic changes without acute abnormality. Electronically Signed   By: Inez Catalina M.D.   On: 07/01/2020 15:05   US RENAL  Result Date: 07/01/2020 CLINICAL DATA:  Acute kidney injury. EXAM: RENAL / URINARY TRACT ULTRASOUND COMPLETE COMPARISON:  CT abdomen pelvis dated May 21, 2020. FINDINGS: Right Kidney: Renal measurements: 10.0 x 4.2 x 5.0 cm = volume: 109 mL. Echogenicity within normal limits. No mass or hydronephrosis visualized. Left Kidney: Renal measurements: 9.1 x 5.1 x 5.3 cm =  volume: 128 mL. Echogenicity within normal limits. No mass or hydronephrosis visualized. Other: Bilateral pleural effusions. IMPRESSION: 1. Normal renal ultrasound. 2. Bilateral pleural effusions. Electronically Signed   By: Titus Dubin M.D.   On: 07/01/2020 15:55   DG Chest Port 1 View  Result Date: 07/01/2020 CLINICAL DATA:  Shortness of breath EXAM: PORTABLE CHEST 1 VIEW COMPARISON:  April 04, 2019 FINDINGS: Port-A-Cath tip is in the superior vena cava. No pneumothorax. There is a rather minimal right pleural effusion. No edema or airspace opacity. Heart size and pulmonary vascularity are normal. No adenopathy. No bone lesions. IMPRESSION: Port-A-Cath as noted. Other minimal right pleural effusion. Lungs otherwise clear. Cardiac silhouette normal. Electronically Signed   By: Lowella Grip III M.D.   On: 07/01/2020 11:44    ASSESSMENT AND PLAN: This is a 60 year old African-American female with stage IV (T2a, N2, M1b) non-small cell lung cancer, adenocarcinoma presented with a large right upper lobe lung mass in addition to suspicious right paratracheal lymphadenopathy and bilateral pulmonary nodules as well as retroperitoneal lymphadenopathy diagnosed in May 2021.  She has no actionable mutations and is negative for PD-L1.  The patient started palliative systemic chemotherapy with carboplatin for an AUC of 5, Alimta 500 mg/m, and Keytruda 200 mg IV every 3 weeks.  Starting from cycle #5, she has been on maintenance Alimta and Keytruda.  Her Alimta dose has been dose reduced secondary to neutropenia.  She has also required intermittent holding of Alimta due to renal insufficiency.  She last received her chemotherapy on 06/18/2020.  The patient is now admitted GI bleed, pancytopenia, AKI, seizure-like activity, and probable sepsis with gram-positive cocci bacteremia.  She is being followed closely by critical care and GI.  Plan is for a nuclear med bleeding scan today.  CBC reviewed.   Pancytopenia likely due to accommodation of chemotherapy and sepsis.  Typically, Alimta and Keytruda do not cause profound pancytopenia and we do not routinely use G-CSF with this regimen.  However, she does have renal insufficiency and was not taking her folic acid as directed which may have also contributed to toxicity from Alimta.  We will start the patient on Granix 300 mcg subcu daily until [place] is above 1.5.  Recommend PRBC transfusion per ICU protocol and transfuse platelets for platelet count less than 20,000 or active bleeding.   LOS: 1 day   Mikey Bussing, DNP, AGPCNP-BC, AOCNP 07/02/20  ADDENDUM: Hematology/Oncology Attending: The patient is seen and examined today.  I agree with the above note.  This is a very pleasant 60 years old African-American female with history of stage IV non-small cell lung cancer, adenocarcinoma with no actionable mutations and negative PD-L1 expression.  The patient also has a long history of drug and alcohol abuse.  She started initial systemic chemotherapy with carboplatin, Alimta and Keytruda for 4 cycles followed by maintenance treatment with Alimta and Keytruda and has been tolerating this treatment fairly well. She was admitted to the hospital with pancytopenia, gastrointestinal bleeding as well as acute kidney injury and seizure-like activity. The patient received PRBCs transfusion and start IV hydration and feeling much better today. For the chemotherapy-induced neutropenia, we will continue supporting the patient with Granix until the absolute neutrophil count is over 1500. Would also recommend for the patient to receive platelets count to keep her platelet count above 20,000 and RBCs transfusion to keep her hemoglobin above 8. I would consider discontinuing her chemotherapy with Alimta and keep the patient on maintenance immunotherapy with Keytruda only after her discharge. Thank you so much for taking good care of of Mrs. Baldo Ash. Please call if  you have any question.  Disclaimer: This note was dictated with voice recognition software. Similar sounding words can inadvertently be transcribed and may be missed upon review. Eilleen Kempf, MD

## 2020-07-02 NOTE — Consult Note (Addendum)
Referring Provider: PCCM Primary Care Physician:  Pcp, No Primary Gastroenterologist:  None (unassigned)  Reason for Consultation:  Hematochezia  HPI: Brenda [name] is a 60 y.o. female with stage IV lung cancer undergoing chemotherapy, admitted through the emergency room yesterday because of a nosebleed, vomiting, and dark stools, and found to be profoundly pancytopenic, with hemoglobin 2.4, white count 700 (absolute neutrophil count 100), and platelets less than 5000.  She had some confusion, and underwent a head CT scan that was negative for acute changes.  She was also noted to have acute kidney injury, BUN 91 with creatinine of 5.56, whereas just 2 weeks earlier, BUN had been 11 with creatinine of 1.33.  Just 2 weeks earlier, hemoglobin had been 9.4, white count 4500 (absolute neutrophil count 2600), and platelets 248,000.  Of note, the patient is not an optimal historian, so most of the information is obtained from record review and discussion with the patient's nurses and referring physician, Dr. Lucile Shutters of critical care medicine.  The patient was seen in consultation by [facility] back in January when she was admitted to the hospital with abdominal pain and a history of dark stools, in the setting of chronic alcohol abuse (apparently lives in a hotel), and use of crack cocaine and apparently [place] powders as well.  At that time, the patient was moderately pancytopenic, white count 2300 (absolute neutrophil count 700), hemoglobin 9.3, platelets 44,000.  Endoscopy on that admission was essentially negative, some mild gastritis with a prepyloric nonbleeding erosion and some focal mucosal hemorrhages in the fundus of the stomach, as well as mild transient contact oozing.  There was no mention of varices or portal gastropathy.  Following admission yesterday, the plan was for observation but then, during the night last night, she had 3 episodes of large-volume hematochezia of what sounds like burgundy  blood and clots, prompting consultation with [place].  With this, she has not been shocky or overtly unstable.  The patient at this time denies chest pain, shortness of breath, or abdominal pain.  Past Medical History:  Diagnosis Date  . Breast cancer ([place])   . Depression   . Mental disorder     Past Surgical History:  Procedure Laterality Date  . ESOPHAGOGASTRODUODENOSCOPY (EGD) WITH PROPOFOL N/A 04/09/2020   Procedure: ESOPHAGOGASTRODUODENOSCOPY (EGD) WITH PROPOFOL;  Surgeon: Doran Stabler, MD;  Location: [facility];  Service: Gastroenterology;  Laterality: N/A;  . IR IMAGING GUIDED PORT INSERTION  11/03/2019  . TUBAL LIGATION    . VIDEO BRONCHOSCOPY WITH ENDOBRONCHIAL NAVIGATION N/A 04/04/2019   Procedure: VIDEO BRONCHOSCOPY WITH ENDOBRONCHIAL NAVIGATION;  Surgeon: Garner Nash, DO;  Location: [facility];  Service: Thoracic;  Laterality: N/A;  . VIDEO BRONCHOSCOPY WITH ENDOBRONCHIAL ULTRASOUND N/A 04/04/2019   Procedure: VIDEO BRONCHOSCOPY WITH ENDOBRONCHIAL ULTRASOUND;  Surgeon: Garner Nash, DO;  Location: [facility];  Service: Thoracic;  Laterality: N/A;    Prior to Admission medications   Medication Sig Start Date End Date Taking? Authorizing Provider  acetaminophen (TYLENOL) 325 MG tablet Take 2 tablets (650 mg total) by mouth every 6 (six) hours as needed for mild pain, moderate pain or headache. 04/11/20  Yes Domenic Polite, MD  ferrous sulfate 325 (65 FE) MG tablet TAKE 1 TABLET (325 MG TOTAL) BY MOUTH 2 (TWO) TIMES DAILY WITH A MEAL. Patient taking differently: Take 325 mg by mouth 2 (two) times daily with a meal. 04/11/20 04/11/21 Yes Domenic Polite, MD  folic acid (FOLVITE) 1 MG tablet TAKE 1 TABLET (1  MG TOTAL) BY MOUTH DAILY. Patient taking differently: Take 1 mg by mouth daily. 04/11/20 04/11/21 Yes Domenic Polite, MD  gabapentin (NEURONTIN) 300 MG capsule Take 1 capsule (300 mg total) by mouth at bedtime. 06/11/20  Yes Tanner, Lyndon Code., PA-C  lidocaine-prilocaine (EMLA) cream  Apply 1 application topically as needed. Patient taking differently: Apply 1 application topically as needed (port access). 10/19/19  Yes Heilingoetter, Cassandra L, PA-C  pantoprazole (PROTONIX) 40 MG tablet TAKE 1 TABLET (40 MG TOTAL) BY MOUTH 2 (TWO) TIMES DAILY. Patient taking differently: Take 40 mg by mouth 2 (two) times daily. 04/11/20 04/11/21 Yes Domenic Polite, MD  potassium chloride SA (KLOR-CON) 20 MEQ tablet TAKE 1 TABLET (20 MEQ TOTAL) BY MOUTH ONCE FOR 1 DOSE. Patient not taking: Reported on 07/01/2020 04/17/20 04/17/21  Curt Bears, MD  prochlorperazine (COMPAZINE) 10 MG tablet Take 1 tablet (10 mg total) by mouth every 6 (six) hours as needed for nausea or vomiting. Patient not taking: Reported on 04/08/2020 01/17/20 04/11/20  Heilingoetter, Cassandra L, PA-C    Current Facility-Administered Medications  Medication Dose Route Frequency Provider Last Rate Last Admin  . Chlorhexidine Gluconate Cloth 2 % PADS 6 each  6 each Topical Daily Hunsucker, Bonna Gains, MD   6 each at 07/01/20 1724  . docusate sodium (COLACE) capsule 100 mg  100 mg Oral BID PRN Gleason, Otilio Carpen, PA-C      . insulin aspart (novoLOG) injection 0-9 Units  0-9 Units Subcutaneous Q4H Gleason, Otilio Carpen, PA-C   2 Units at 07/01/20 1735  . lactated ringers infusion   Intravenous Continuous Gleason, Otilio Carpen, PA-C 125 mL/hr at 07/02/20 2202 Infusion Verify at 07/02/20 0644  . mupirocin ointment (BACTROBAN) 2 % 1 application  1 application Nasal BID Hunsucker, Bonna Gains, MD   1 application at 54/27/06 0035  . pantoprazole (PROTONIX) injection 40 mg  40 mg Intravenous Q12H Gleason, Otilio Carpen, PA-C   40 mg at 07/01/20 2236  . polyethylene glycol (MIRALAX / GLYCOLAX) packet 17 g  17 g Oral Daily PRN Gleason, Otilio Carpen, PA-C      . potassium chloride 10 mEq in 50 mL *CENTRAL LINE* IVPB  10 mEq Intravenous Q1 Hr x 2 Frederik Pear, MD 50 mL/hr at 07/02/20 0644 Infusion Verify at 07/02/20 0644    Allergies as of 07/01/2020 -  Review Complete 07/01/2020  Allergen Reaction Noted  . Aspirin adult low [aspirin] Other (See Comments) 02/25/2012    No family history on file.  Social History   Socioeconomic History  . Marital status: Legally Separated    Spouse name: Not on file  . Number of children: Not on file  . Years of education: Not on file  . Highest education level: Not on file  Occupational History  . Not on file  Tobacco Use  . Smoking status: Former Smoker    Packs/day: 0.50    Years: 30.00    Pack years: 15.00    Types: Cigarettes    Quit date: 05/27/2019    Years since quitting: 1.1  . Smokeless tobacco: Never Used  Vaping Use  . Vaping Use: Never used  Substance and Sexual Activity  . Alcohol use: Yes    Alcohol/week: 84.0 standard drinks    Types: 84 Cans of beer per week  . Drug use: Yes    Types: Cocaine  . Sexual activity: Not on file  Other Topics Concern  . Not on file  Social History Narrative   ** Merged  History Encounter **       Social Determinants of Health   Financial Resource Strain: Not on file  Food Insecurity: Not on file  Transportation Needs: Not on file  Physical Activity: Not on file  Stress: Not on file  Social Connections: Not on file  Intimate Partner Violence: Not on file    Review of Systems: Negative for odynophagia, prodromal dyspeptic symptomatology, chest pain, breathing difficulties, or urinary symptoms.  However, reliability of review of systems is uncertain given that the patient is not completely coherent.  Physical Exam: Vital signs in last 24 hours: Temp:  [87.3 F (30.7 C)-97.34 F (36.3 C)] 96.44 F (35.8 C) (04/12 0700) Pulse Rate:  [73-133] 73 (04/12 0700) Resp:  [14-37] 17 (04/12 0700) BP: (81-133)/(45-101) 113/60 (04/12 0700) SpO2:  [92 %-100 %] 100 % (04/12 0700) Weight:  [52.6 kg-52.9 kg] 52.9 kg (04/12 0315) Last BM Date: 07/01/20 (pt reported) General:   Alert,  Well-developed, well-nourished, pleasant and cooperative in  NAD, lying in ICU bed under a warming "hugger" Head:  Normocephalic and atraumatic. Eyes:  Sclera clear, no icterus.   Conjunctiva pink following transfusion. Mouth:   No ulcerations or lesions. No thrush. Oropharynx pink & moist. Lungs:  Clear anteriorly to auscultation.   No wheezes, crackles, or rhonchi. No evident respiratory distress. Heart:   Regular rate and rhythm; no murmurs, clicks, rubs,  or gallops. Abdomen:  Soft, nontender, nontympanitic, and nondistended. No masses, hepatosplenomegaly or ventral hernias noted. Normal bowel sounds, without bruits, guarding, or rebound.   Msk:   Symmetrical without gross deformities. Extremities:   Without edema. Neurologic:  Alert and semi-coherent; oriented x 2-1/2 (thinks it's March '22).  No overt focal neuro deficits. Skin:  Intact without significant lesions or rashes.  No ecchymoses. Psych:   Alert and cooperative. Normal mood and affect.  Intake/Output from previous day: 04/11 0701 - 04/12 0700 In: 4069.1 [I.V.:1746.2; Blood:2308.8; IV Piggyback:14.1] Out: 201 [Urine:200; Stool:1] Intake/Output this shift: No intake/output data recorded.  Lab Results: Recent Labs    07/01/20 1320 07/01/20 1459 07/02/20 0308  WBC UNABLE TO OBTAIN INSTRUMENT VALUE DUE TO SPECIMEN INTEGRITY. 0.7* 0.7*  HGB UNABLE TO OBTAIN INSTRUMENT VALUE DUE TO SPECIMEN INTEGRITY. 2.4* 9.1*  HCT UNABLE TO OBTAIN INSTRUMENT VALUE DUE TO SPECIMEN INTEGRITY. 7.9* 25.6*  PLT UNABLE TO OBTAIN INSTRUMENT VALUE DUE TO SPECIMEN INTEGRITY. <5* <5*   BMET Recent Labs    07/01/20 1155 07/01/20 1320 07/02/20 0308  NA QUESTIONABLE RESULTS, RECOMMEND RECOLLECT TO VERIFY 133* 133*  K QUESTIONABLE RESULTS, RECOMMEND RECOLLECT TO VERIFY 3.9 3.1*  CL QUESTIONABLE RESULTS, RECOMMEND RECOLLECT TO VERIFY 104 105  CO2 QUESTIONABLE RESULTS, RECOMMEND RECOLLECT TO VERIFY <7* 14*  GLUCOSE QUESTIONABLE RESULTS, RECOMMEND RECOLLECT TO VERIFY 190* 105*  BUN QUESTIONABLE RESULTS,  RECOMMEND RECOLLECT TO VERIFY 91* 94*  CREATININE QUESTIONABLE RESULTS, RECOMMEND RECOLLECT TO VERIFY 5.56* 4.59*  CALCIUM QUESTIONABLE RESULTS, RECOMMEND RECOLLECT TO VERIFY 6.7* 7.0*   LFT Recent Labs    07/01/20 1320  PROT 4.4*  ALBUMIN 1.4*  AST 68*  ALT 32  ALKPHOS 62  BILITOT 0.5   PT/INR Recent Labs    07/01/20 1156 07/02/20 0452  LABPROT 16.3* 15.7*  INR 1.4* 1.3*    Studies/Results: CT Head Wo Contrast  Result Date: 07/01/2020 CLINICAL DATA:  Recent seizure activity EXAM: CT HEAD WITHOUT CONTRAST TECHNIQUE: Contiguous axial images were obtained from the base of the skull through the vertex without intravenous contrast. COMPARISON:  04/07/2020 FINDINGS: Brain: Mild atrophic  changes are noted. No findings to suggest acute hemorrhage, acute infarction or space-occupying mass lesion noted. Vascular: No hyperdense vessel or unexpected calcification. Skull: Normal. Negative for fracture or focal lesion. Sinuses/Orbits: No acute finding. Chronic soft tissue changes are noted in the left ethmoid sinuses. Other: None. IMPRESSION: Mild atrophic changes without acute abnormality. Electronically Signed   By: Inez Catalina M.D.   On: 07/01/2020 15:05   US RENAL  Result Date: 07/01/2020 CLINICAL DATA:  Acute kidney injury. EXAM: RENAL / URINARY TRACT ULTRASOUND COMPLETE COMPARISON:  CT abdomen pelvis dated May 21, 2020. FINDINGS: Right Kidney: Renal measurements: 10.0 x 4.2 x 5.0 cm = volume: 109 mL. Echogenicity within normal limits. No mass or hydronephrosis visualized. Left Kidney: Renal measurements: 9.1 x 5.1 x 5.3 cm = volume: 128 mL. Echogenicity within normal limits. No mass or hydronephrosis visualized. Other: Bilateral pleural effusions. IMPRESSION: 1. Normal renal ultrasound. 2. Bilateral pleural effusions. Electronically Signed   By: Titus Dubin M.D.   On: 07/01/2020 15:55   DG Chest Port 1 View  Result Date: 07/01/2020 CLINICAL DATA:  Shortness of breath EXAM: PORTABLE  CHEST 1 VIEW COMPARISON:  April 04, 2019 FINDINGS: Port-A-Cath tip is in the superior vena cava. No pneumothorax. There is a rather minimal right pleural effusion. No edema or airspace opacity. Heart size and pulmonary vascularity are normal. No adenopathy. No bone lesions. IMPRESSION: Port-A-Cath as noted. Other minimal right pleural effusion. Lungs otherwise clear. Cardiac silhouette normal. Electronically Signed   By: Lowella Grip III M.D.   On: 07/01/2020 11:44    Impression: 1.  Acute GI bleeding characterized by repeated passage of burgundy clots, large-volume, without hemodynamic instability 2.  Profound pancytopenia with platelets remaining under 5000 despite transfusion of 1 unit of platelets 3.  Presented yesterday with dark stools with recent epistaxis 4.  Previous history of dark stools 3 months ago, with endoscopy at that time without major findings 5.  Lung cancer, chemotherapy 6.  Recorded history of alcohol and drug abuse and apparently poor social situation 7.  Altered mental status 8.  Acute kidney injury with estimated GFR of 11  DISCUSSION: The most likely source of this patient's bleeding is diffuse mucosal oozing as consequence of her profound thrombocytopenia.  It is possible, but less likely, that there is a specific "point source" of bleeding, but even if there is, it is probably bleeding because of the profound thrombocytopenia.  The fact that she is passing clotted blood, despite a very low platelet count, implies that the rate of blood loss is probably quite low (allowing sufficient retention time for the blood to coagulate).  This is further supported by the relative tolerance of a hemoglobin of 2.4 when she presented yesterday, and the fact that she has remained hemodynamically stable despite passage of large amounts of blood.  Plan: 1.  Agree with plan for nuclear medicine bleeding scan as ordered by PCCM 2.  Agree with IV Protonix 3.  No clear role for  octreotide, given absence of evidence of portal hypertension on recent CT scan and endoscopy 3 months ago 4.  The highest priority for this patient is correction of the platelet count.  That, by itself, will probably bring about cessation of bleeding and obviate the need for further interventions to achieve hemostasis. 5.  This patient is not a suitable candidate for endoscopy or colonoscopy under present circumstances.  Her platelet count and, ideally, her white count would have to improve substantially before she could be safely  scoped.  Fortunately, as mentioned above, it is unlikely that endoscopy or colonoscopy are needed, because there is probably not a specific, localized source that can be intervened upon endoscopically. 6.  We will continue to follow the patient with you although, as mentioned, there is probably little role for [place] from the procedural standpoint, at least for the time being. 7.  While awaiting hematology input, will request neutropenic precautions.     LOS: 1 day   Youlanda Mighty Afnan Cadiente  07/02/2020, 7:30 AM   Pager [phone] If no answer or after 5 PM call [phone]

## 2020-07-02 NOTE — Progress Notes (Addendum)
eLink Physician-Brief Progress Note Patient Name: Brenda [name] DOB: 11-04-1960 MRN: [id number]   Date of Service  07/02/2020  HPI/Events of Note  Patient with sizeable amount of blood per rectum, she came in with a hemoglobin of 2.4 gm / dl and is s/p transfusion of 4 units of PRBC and a unit of platelets, blood pressure is soft.  eICU Interventions  Blood bank notified to hold PRBC and platelets in reserve, stat GI consultation, IR will also be put on notice . Serial H and H,  Obtain current coagulation parameters.        Kerry Kass Jlynn Ly 07/02/2020, 4:46 AM

## 2020-07-02 NOTE — Progress Notes (Signed)
[facility] Progress Note Patient Name: Brenda [name] DOB: [birth year] MRN: [id number]   Date of Service  07/02/2020  HPI/Events of Note  Pain related to phlebotomy - AST = 68 and Creatinine = 4.96.  eICU Interventions  Plan: 1. Tylenol 650 mg PO X 1 now.      Intervention Category Major Interventions: Other:  Lysle Dingwall 07/02/2020, 11:51 PM

## 2020-07-02 NOTE — Progress Notes (Signed)
Patient ID: Brenda [name], female   DOB: [birth year], 60 y.o.   MRN: [id number]  RBC scan showed mild acute GI bleeding probably in small bowel. Due to renal failure doubt angiogram/embolization is feasible and doubt any localized source. No role for endoscopic management. Suspect diffuse mucosal bleeding and needs additional platelet transfusions and follow CBC closely. D/W nurse.

## 2020-07-02 NOTE — Progress Notes (Signed)
Lab unsuccessful at attempt to collect blood culture from peripheral site. Dr. Silas Flood notified.

## 2020-07-02 NOTE — Progress Notes (Signed)
Critical lab hgb 6.0 called to elink 0730 07/02/20

## 2020-07-02 NOTE — Progress Notes (Signed)
Pharmacy Antibiotic Note  Brenda [name] is a 60 y.o. female admitted on 07/01/2020 with possible CoNS bacteremia.  Pharmacy has been consulted for vancomycin dosing. Severe AKI noted on admission (SCr 4x baseline). Vancomycin 1g given in ED on 4/11 at 1300.  Plan:  Check random vanc level 4/13 in AM (given body weight and CrCl, 1000 mg dose x 1 should provide ~48 hr coverage) and redose as necessary  F/u ID recommendations  Height: 5\' 6"  (167.6 cm) Weight: 52.9 kg (116 lb 10 oz) IBW/kg (Calculated) : 59.3  Temp (24hrs), Avg:95.3 F (35.2 C), Min:91 F (32.8 C), Max:97.34 F (36.3 C)  Recent Labs  Lab 07/01/20 1151 07/01/20 1155 07/01/20 1320 07/01/20 1416 07/01/20 1459 07/02/20 0308 07/02/20 0930 07/02/20 1428  WBC  --   --  UNABLE TO OBTAIN INSTRUMENT VALUE DUE TO SPECIMEN INTEGRITY.  --  0.7* 0.7* 0.5* 0.4*  CREATININE  --  QUESTIONABLE RESULTS, RECOMMEND RECOLLECT TO VERIFY 5.56*  --   --  4.59* 4.96*  --   LATICACIDVEN >11.0*  --   --  >11.0*  --  1.0  --   --     Estimated Creatinine Clearance: 10.5 mL/min (A) (by C-G formula based on SCr of 4.96 mg/dL (H)).    Allergies  Allergen Reactions  . Aspirin Adult Low [Aspirin] Other (See Comments)    Stomach upset     Thank you for allowing pharmacy to be a part of this patient's care.  Maggie Dworkin A 07/02/2020 3:31 PM

## 2020-07-02 NOTE — Progress Notes (Addendum)
PCCM interval progress note:  Single blood culture growing likely coag neg staph, possibly contaminant, but in the setting of indwelling line, hypothermia and neutropenia have continued vancomycin and consulted ID who recommended repeat peripheral BCx2 and will see patient tomorrow.  Otilio Carpen Kwasi Joung, PA-C

## 2020-07-02 NOTE — Progress Notes (Signed)
[facility] Progress Note Patient Name: Brenda [name] DOB: 1960/06/18 MRN: [id number]   Date of Service  07/02/2020  HPI/Events of Note  Anemia - Hgb = 6.0.  eICU Interventions  Will transfuse 1 unit PRBC now.     Intervention Category Major Interventions: Other:  Lysle Dingwall 07/02/2020, 7:34 PM

## 2020-07-02 NOTE — Progress Notes (Addendum)
Critical lab: hbg 4.8   elink notifited 2215 07/02/20.  Lab drawn prior to new unit of blood, one unit currently transfusing, RN will redraw hgb per protocol once the 1 unit is complete.   elink also made aware of soft bp, last reading 89/44 (57)

## 2020-07-02 NOTE — Progress Notes (Signed)
PHARMACY - PHYSICIAN COMMUNICATION CRITICAL VALUE ALERT - BLOOD CULTURE IDENTIFICATION (BCID)  Brenda [name] is an 60 y.o. female who presented to [facility] on 07/01/2020 with a chief complaint of epistaxis; found to have AKI and pancytopenia  Assessment:  1/1 BCx (1/2 bottles) with CoNS (assume methicillin resistant) - difficult to discern if contam or real since only 1 BCx drawn, and pt immunosuppressed  Name of physician (or Provider) Contacted: Hunsucker/Gleason  Current antibiotics: none, got vanc 1g yesterday  Changes to prescribed antibiotics recommended: resume vanc; ID consulted   Results for orders placed or performed during the hospital encounter of 07/01/20  Blood Culture ID Panel (Reflexed) (Collected: 07/01/2020 11:53 AM)  Result Value Ref Range   Enterococcus faecalis NOT DETECTED NOT DETECTED   Enterococcus Faecium NOT DETECTED NOT DETECTED   Listeria monocytogenes NOT DETECTED NOT DETECTED   Staphylococcus species DETECTED (A) NOT DETECTED   Staphylococcus aureus (BCID) NOT DETECTED NOT DETECTED   Staphylococcus epidermidis NOT DETECTED NOT DETECTED   Staphylococcus lugdunensis NOT DETECTED NOT DETECTED   Streptococcus species NOT DETECTED NOT DETECTED   Streptococcus agalactiae NOT DETECTED NOT DETECTED   Streptococcus pneumoniae NOT DETECTED NOT DETECTED   Streptococcus pyogenes NOT DETECTED NOT DETECTED   A.calcoaceticus-baumannii NOT DETECTED NOT DETECTED   Bacteroides fragilis NOT DETECTED NOT DETECTED   Enterobacterales NOT DETECTED NOT DETECTED   Enterobacter cloacae complex NOT DETECTED NOT DETECTED   Escherichia coli NOT DETECTED NOT DETECTED   Klebsiella aerogenes NOT DETECTED NOT DETECTED   Klebsiella oxytoca NOT DETECTED NOT DETECTED   Klebsiella pneumoniae NOT DETECTED NOT DETECTED   Proteus species NOT DETECTED NOT DETECTED   Salmonella species NOT DETECTED NOT DETECTED   Serratia marcescens NOT DETECTED NOT DETECTED   Haemophilus influenzae NOT  DETECTED NOT DETECTED   Neisseria meningitidis NOT DETECTED NOT DETECTED   Pseudomonas aeruginosa NOT DETECTED NOT DETECTED   Stenotrophomonas maltophilia NOT DETECTED NOT DETECTED   Candida albicans NOT DETECTED NOT DETECTED   Candida auris NOT DETECTED NOT DETECTED   Candida glabrata NOT DETECTED NOT DETECTED   Candida krusei NOT DETECTED NOT DETECTED   Candida parapsilosis NOT DETECTED NOT DETECTED   Candida tropicalis NOT DETECTED NOT DETECTED   Cryptococcus neoformans/gattii NOT DETECTED NOT DETECTED    Brenda [name] A 07/02/2020  2:50 PM

## 2020-07-02 NOTE — Progress Notes (Signed)
Critical platelets <5 & WBC 0.7 called to elink 07/02/20.  Pt also had several blood clots passed from her rectum, elink made aware.

## 2020-07-02 NOTE — Consult Note (Signed)
[facility]    Date of Admission:  07/01/2020     Reason for Consult: CoNS positive blood culture     Referring Physician: PCCM  Current antibiotics: Vancomycin x 1 dose 4/11 Cefepime x 1 dose 4/11  ASSESSMENT:    1. Coagulase-negative staph species positive blood culture: Only 1 set of cultures was obtained, however, aerobic and anaerobic bottles are positive and she has an indwelling line making it difficult to discern whether this is a true pathogen or a contaminant.  I would suspect likely a contaminant given that her presentation appears to be driven primarily by severe pancytopenia as a result of chemotherapy toxicity but again it is difficult to tell 2. Acute kidney injury 3. Pancytopenia: ANC currently 100 and oncology planning to start Granix until her Ch[place] improves above 1.5 4. Hypothermia 5. Stage IV lung cancer 6. Black stools, GI bleed, epistaxis: In the setting of profound thrombocytopenia  PLAN:    . Obtain repeat peripheral blood cultures x2 . Discussed with pharmacy and given her acute renal failure she should have adequate levels of vancomycin still circulating based on the dose she received.  Will obtain a vancomycin level tomorrow morning and redose as appropriate pending her clinical course . Will follow    MEDICATIONS:    Scheduled Meds: . Chlorhexidine Gluconate Cloth  6 each Topical Daily  . insulin aspart  0-9 Units Subcutaneous Q4H  . mupirocin ointment  1 application Nasal BID  . pantoprazole (PROTONIX) IV  40 mg Intravenous Q12H  . Tbo-filgastrim (GRANIX) SQ  300 mcg Subcutaneous q1800   Continuous Infusions: . lactated ringers 125 mL/hr at 07/02/20 0801   PRN Meds:.docusate sodium, polyethylene glycol  HPI:    Brenda [name] is a 60 y.o. female with stage IV non-small cell lung cancer undergoing chemotherapy who was admitted through the emergency department on 07/02/2019 due to severe epistaxis, vomiting, dark  stools, and found to be profoundly pancytopenic with hemoglobin 2.4, WBC 700 (ANC 100), and platelets less than 5000.  She was also noted to have some confusion and underwent head CT which was negative for acute changes.  She also was found to have acute kidney injury with BUN 91, creatinine 5.56.  She was also noted to be hypothermic on admission and so blood cultures were obtained and she was given a dose of vancomycin and cefepime.  Unfortunately she is a difficult venous draw and so only 1 set of cultures was able to be completed at this time.  Following her admission yesterday she had 3 episodes of large-volume hematochezia prompting a gastroenterology consultation.  An RBC scan showed mild acute GI bleeding probably in the small bowel.  However, due to renal failure angiogram/embolization likely is not feasible and GI doubts any localized source.  There is no role for endoscopic management and the suspicion was that she had diffuse mucosal bleeding and needed additional platelet transfusions.  Her pancytopenia and acute renal failure was felt to be likely related to her chemotherapy toxicity.  From her admission blood cultures that were drawn there are gram-positive cocci in clusters in both aerobic and anaerobic bottles with BC ID identified a staph species.  This was overall suspicious for a contaminant, however, given her hypothermia, tachycardia, and leukopenia there was concern that this could be driven by sepsis as she does have an indwelling port for her chemotherapy.   Past Medical History:  Diagnosis Date  . Breast cancer ([place])   .  Depression   . Mental disorder     Social History   Tobacco Use  . Smoking status: Former Smoker    Packs/day: 0.50    Years: 30.00    Pack years: 15.00    Types: Cigarettes    Quit date: 05/27/2019    Years since quitting: 1.1  . Smokeless tobacco: Never Used  Vaping Use  . Vaping Use: Never used  Substance Use Topics  . Alcohol use: Yes     Alcohol/week: 84.0 standard drinks    Types: 84 Cans of beer per week  . Drug use: Yes    Types: Cocaine    No family history on file.  Allergies  Allergen Reactions  . Aspirin Adult Low [Aspirin] Other (See Comments)    Stomach upset    Review of Systems  Constitutional: Negative for chills and fever.  HENT: Positive for nosebleeds. Negative for sinus pain and sore throat.   Respiratory: Negative.   Cardiovascular: Negative.   Gastrointestinal: Negative for blood in stool.  Genitourinary: Negative.   Musculoskeletal: Negative for back pain and joint pain.  All other systems reviewed and are negative.   OBJECTIVE:   Blood pressure 116/67, pulse 81, temperature (!) 97.2 F (36.2 C), resp. rate 18, height 5\' 6"  (1.676 m), weight 52.9 kg, last menstrual period 01/19/2011, SpO2 100 %. Body mass index is 18.82 kg/m.  Physical Exam Constitutional:      Comments: Alert, lying in bed, no acute distress, chronically ill-appearing  HENT:     Head: Normocephalic and atraumatic.     Mouth/Throat:     Comments: Dentition is poor with dried blood noted on her lips.  No evidence of mucositis. Cardiovascular:     Rate and Rhythm: Normal rate and regular rhythm.     Comments: Right chest wall port without any tenderness or erythema. Pulmonary:     Effort: Pulmonary effort is normal. No respiratory distress.     Breath sounds: Normal breath sounds.  Abdominal:     Palpations: Abdomen is soft.     Tenderness: There is no abdominal tenderness. There is no guarding.  Musculoskeletal:     Right lower leg: No edema.     Left lower leg: No edema.  Skin:    General: Skin is warm and dry.     Findings: No erythema or rash.  Neurological:     General: No focal deficit present.     Mental Status: She is oriented to person, place, and time.  Psychiatric:        Mood and Affect: Mood normal.        Behavior: Behavior normal.      Lab Results: Lab Results  Component Value Date    WBC 0.4 (LL) 07/02/2020   HGB 7.3 (L) 07/02/2020   HCT 20.2 (L) 07/02/2020   MCV 89.8 07/02/2020   PLT 37 (L) 07/02/2020    Lab Results  Component Value Date   NA 136 07/02/2020   K 2.9 (L) 07/02/2020   CO2 13 (L) 07/02/2020   GLUCOSE 99 07/02/2020   BUN 97 (H) 07/02/2020   CREATININE 4.96 (H) 07/02/2020   CALCIUM 6.6 (L) 07/02/2020   GFRNONAA 10 (L) 07/02/2020   GFRAA  07/01/2020    QUESTIONABLE RESULTS, RECOMMEND RECOLLECT TO VERIFY    Lab Results  Component Value Date   ALT 32 07/01/2020   AST 68 (H) 07/01/2020   ALKPHOS 62 07/01/2020   BILITOT 0.5 07/01/2020    No results  found for: CRP  No results found for: ESRSEDRATE  I have reviewed the micro and lab results in Epic.  Imaging: CT Head Wo Contrast  Result Date: 07/01/2020 CLINICAL DATA:  Recent seizure activity EXAM: CT HEAD WITHOUT CONTRAST TECHNIQUE: Contiguous axial images were obtained from the base of the skull through the vertex without intravenous contrast. COMPARISON:  04/07/2020 FINDINGS: Brain: Mild atrophic changes are noted. No findings to suggest acute hemorrhage, acute infarction or space-occupying mass lesion noted. Vascular: No hyperdense vessel or unexpected calcification. Skull: Normal. Negative for fracture or focal lesion. Sinuses/Orbits: No acute finding. Chronic soft tissue changes are noted in the left ethmoid sinuses. Other: None. IMPRESSION: Mild atrophic changes without acute abnormality. Electronically Signed   By: Inez Catalina M.D.   On: 07/01/2020 15:05   NM GI Blood Loss  Result Date: 07/02/2020 CLINICAL DATA:  Acute lower GI bleeding, 3 episodes of large volume hematochezia. History of lung cancer EXAM: NUCLEAR MEDICINE GASTROINTESTINAL BLEEDING SCAN TECHNIQUE: Sequential abdominal images were obtained following intravenous administration of Tc-27m labeled red blood cells. RADIOPHARMACEUTICALS:  22.7 mCi Tc-35m pertechnetate in-vitro labeled red cells. COMPARISON:  05/21/2020 CT with  contrast FINDINGS: Abnormal activity within the mid and lower abdomen extending into the upper pelvis and peristalsing throughout loops of bowel compatible with mild acute GI bleeding. Based on the pattern of peristalsis, it is difficult to determine location but small bowel is favored over colon. IMPRESSION: Positive exam for mild acute GI bleeding as above. These results will be called to the ordering clinician or representative by the Radiologist Assistant, and communication documented in the PACS or [employer]. Electronically Signed   By: Jerilynn Mages.  Shick M.D.   On: 07/02/2020 12:08   US RENAL  Result Date: 07/01/2020 CLINICAL DATA:  Acute kidney injury. EXAM: RENAL / URINARY TRACT ULTRASOUND COMPLETE COMPARISON:  CT abdomen pelvis dated May 21, 2020. FINDINGS: Right Kidney: Renal measurements: 10.0 x 4.2 x 5.0 cm = volume: 109 mL. Echogenicity within normal limits. No mass or hydronephrosis visualized. Left Kidney: Renal measurements: 9.1 x 5.1 x 5.3 cm = volume: 128 mL. Echogenicity within normal limits. No mass or hydronephrosis visualized. Other: Bilateral pleural effusions. IMPRESSION: 1. Normal renal ultrasound. 2. Bilateral pleural effusions. Electronically Signed   By: Titus Dubin M.D.   On: 07/01/2020 15:55   DG Chest Port 1 View  Result Date: 07/01/2020 CLINICAL DATA:  Shortness of breath EXAM: PORTABLE CHEST 1 VIEW COMPARISON:  April 04, 2019 FINDINGS: Port-A-Cath tip is in the superior vena cava. No pneumothorax. There is a rather minimal right pleural effusion. No edema or airspace opacity. Heart size and pulmonary vascularity are normal. No adenopathy. No bone lesions. IMPRESSION: Port-A-Cath as noted. Other minimal right pleural effusion. Lungs otherwise clear. Cardiac silhouette normal. Electronically Signed   By: Lowella Grip III M.D.   On: 07/01/2020 11:44     Imaging independently reviewed in Epic.  [facility] [phone] pager 07/02/2020, 4:57 PM

## 2020-07-02 NOTE — Progress Notes (Signed)
[facility] Progress Note Patient Name: Annamaria Salah DOB: June 03, 1960 MRN: [id number]   Date of Service  07/02/2020  HPI/Events of Note  K+ 3.1, creatinine 4.6  eICU Interventions  KCL 10 meq iv Q 1 hour x 2 ordered.        Frederik Pear 07/02/2020, 5:43 AM

## 2020-07-03 ENCOUNTER — Encounter (HOSPITAL_COMMUNITY): Payer: Self-pay | Admitting: Pulmonary Disease

## 2020-07-03 DIAGNOSIS — E876 Hypokalemia: Secondary | ICD-10-CM

## 2020-07-03 DIAGNOSIS — R7881 Bacteremia: Secondary | ICD-10-CM

## 2020-07-03 DIAGNOSIS — C3491 Malignant neoplasm of unspecified part of right bronchus or lung: Secondary | ICD-10-CM | POA: Diagnosis not present

## 2020-07-03 DIAGNOSIS — K922 Gastrointestinal hemorrhage, unspecified: Secondary | ICD-10-CM | POA: Diagnosis not present

## 2020-07-03 DIAGNOSIS — C3411 Malignant neoplasm of upper lobe, right bronchus or lung: Secondary | ICD-10-CM | POA: Diagnosis not present

## 2020-07-03 DIAGNOSIS — T827XXD Infection and inflammatory reaction due to other cardiac and vascular devices, implants and grafts, subsequent encounter: Secondary | ICD-10-CM

## 2020-07-03 DIAGNOSIS — N179 Acute kidney failure, unspecified: Secondary | ICD-10-CM | POA: Diagnosis not present

## 2020-07-03 DIAGNOSIS — D62 Acute posthemorrhagic anemia: Secondary | ICD-10-CM | POA: Diagnosis not present

## 2020-07-03 DIAGNOSIS — T827XXA Infection and inflammatory reaction due to other cardiac and vascular devices, implants and grafts, initial encounter: Secondary | ICD-10-CM

## 2020-07-03 DIAGNOSIS — D61818 Other pancytopenia: Secondary | ICD-10-CM | POA: Diagnosis not present

## 2020-07-03 LAB — CBC
HCT: 22.4 % — ABNORMAL LOW (ref 36.0–46.0)
Hemoglobin: 7.7 g/dL — ABNORMAL LOW (ref 12.0–15.0)
MCH: 30.1 pg (ref 26.0–34.0)
MCHC: 34.4 g/dL (ref 30.0–36.0)
MCV: 87.5 fL (ref 80.0–100.0)
Platelets: 22 10*3/uL — CL (ref 150–400)
RBC: 2.56 MIL/uL — ABNORMAL LOW (ref 3.87–5.11)
RDW: 15.6 % — ABNORMAL HIGH (ref 11.5–15.5)
WBC: 0.4 10*3/uL — CL (ref 4.0–10.5)
nRBC: 0 % (ref 0.0–0.2)

## 2020-07-03 LAB — PREPARE PLATELET PHERESIS
Unit division: 0
Unit division: 0
Unit division: 0

## 2020-07-03 LAB — GLUCOSE, CAPILLARY
Glucose-Capillary: 100 mg/dL — ABNORMAL HIGH (ref 70–99)
Glucose-Capillary: 100 mg/dL — ABNORMAL HIGH (ref 70–99)
Glucose-Capillary: 113 mg/dL — ABNORMAL HIGH (ref 70–99)
Glucose-Capillary: 120 mg/dL — ABNORMAL HIGH (ref 70–99)
Glucose-Capillary: 81 mg/dL (ref 70–99)
Glucose-Capillary: 87 mg/dL (ref 70–99)
Glucose-Capillary: 98 mg/dL (ref 70–99)

## 2020-07-03 LAB — CBC WITH DIFFERENTIAL/PLATELET
HCT: 26.9 % — ABNORMAL LOW (ref 36.0–46.0)
Hemoglobin: 9.5 g/dL — ABNORMAL LOW (ref 12.0–15.0)
MCH: 30.2 pg (ref 26.0–34.0)
MCHC: 35.3 g/dL (ref 30.0–36.0)
MCV: 85.4 fL (ref 80.0–100.0)
Platelets: 31 10*3/uL — ABNORMAL LOW (ref 150–400)
RBC: 3.15 MIL/uL — ABNORMAL LOW (ref 3.87–5.11)
RDW: 14.9 % (ref 11.5–15.5)
WBC: 0.5 10*3/uL — CL (ref 4.0–10.5)
nRBC: 0 % (ref 0.0–0.2)

## 2020-07-03 LAB — BPAM PLATELET PHERESIS
Blood Product Expiration Date: 202204142359
Blood Product Expiration Date: 202204142359
Blood Product Expiration Date: 202204142359
ISSUE DATE / TIME: 202204120631
ISSUE DATE / TIME: 202204121431
ISSUE DATE / TIME: 202204121652
Unit Type and Rh: 5100
Unit Type and Rh: 6200
Unit Type and Rh: 6200

## 2020-07-03 LAB — BASIC METABOLIC PANEL
Anion gap: 11 (ref 5–15)
BUN: 89 mg/dL — ABNORMAL HIGH (ref 6–20)
CO2: 15 mmol/L — ABNORMAL LOW (ref 22–32)
Calcium: 6.8 mg/dL — ABNORMAL LOW (ref 8.9–10.3)
Chloride: 111 mmol/L (ref 98–111)
Creatinine, Ser: 3.97 mg/dL — ABNORMAL HIGH (ref 0.44–1.00)
GFR, Estimated: 13 mL/min — ABNORMAL LOW (ref >60)
Glucose, Bld: 101 mg/dL — ABNORMAL HIGH (ref 70–99)
Potassium: 2.7 mmol/L — CL (ref 3.5–5.1)
Sodium: 137 mmol/L (ref 135–145)

## 2020-07-03 LAB — VANCOMYCIN, RANDOM: Vancomycin Rm: 12

## 2020-07-03 LAB — PREPARE RBC (CROSSMATCH)

## 2020-07-03 LAB — HEMOGLOBIN A1C
Hgb A1c MFr Bld: <4.2 % — ABNORMAL LOW (ref 4.8–5.6)
Mean Plasma Glucose: <74 mg/dL

## 2020-07-03 LAB — MAGNESIUM: Magnesium: 1.8 mg/dL (ref 1.7–2.4)

## 2020-07-03 MED ORDER — SODIUM CHLORIDE 0.9% IV SOLUTION: Freq: Once | INTRAVENOUS | Status: AC

## 2020-07-03 MED ORDER — GERHARDT'S BUTT CREAM
TOPICAL_CREAM | CUTANEOUS | Status: DC | PRN
  Filled 2020-07-03: qty 1

## 2020-07-03 MED ORDER — VANCOMYCIN HCL 750 MG/150ML IV SOLN
750.0000 mg | Freq: Once | INTRAVENOUS | Status: AC
  Administered 2020-07-03: 750 mg via INTRAVENOUS
  Filled 2020-07-03: qty 150

## 2020-07-03 MED ORDER — POTASSIUM CHLORIDE 10 MEQ/100ML IV SOLN
10.0000 meq | INTRAVENOUS | Status: AC
  Administered 2020-07-03 (×6): 10 meq via INTRAVENOUS
  Filled 2020-07-03 (×7): qty 100

## 2020-07-03 MED ORDER — TRAMADOL HCL 50 MG PO TABS
50.0000 mg | ORAL_TABLET | Freq: Once | ORAL | Status: AC
  Administered 2020-07-03: 50 mg via ORAL
  Filled 2020-07-03: qty 1

## 2020-07-03 MED ORDER — POTASSIUM CHLORIDE 10 MEQ/100ML IV SOLN
INTRAVENOUS | Status: AC
  Filled 2020-07-03: qty 100

## 2020-07-03 MED ORDER — MAGNESIUM SULFATE 2 GM/50ML IV SOLN
2.0000 g | Freq: Once | INTRAVENOUS | Status: AC
  Administered 2020-07-03: 2 g via INTRAVENOUS
  Filled 2020-07-03: qty 50

## 2020-07-03 NOTE — Progress Notes (Addendum)
PROGRESS NOTE  Brenda [name] XBM:[id number] DOB: [birth year] DOA: 07/01/2020 PCP: Pcp, No   LOS: 2 days   Brief narrative:  Brenda [name] is a 60 years old female with past medical history of stage IV non-small cell lung cancer- adenocarcinoma of right lung, depression undergoing chemotherapy with Dr. Julien Nordmann presented to hospital with epistaxis dark stools and generalized weakness for few days.  On initial presentation blood pressure was stable but was mildly hypothermic CXR with Port-A-Cath in place and minimal right pleural effusion but otherwise clear.  Her lactic acid was > 11, with INR 1.4 and AKI of creatinine over 5 without hyperkalemia and metabolic acidosis.  Patient received PRBC in the ED and was more somnolent and had focal seizure-like activity.  She was given Ativan and Keppra head CT was done.  Bear hugger was initiated and the patient was admitted to the hospital.  Hemoglobin was around 2.4 subsequently and was given 4 more units of packed RBC.  Of note patient was initiated on chemotherapy on 09/28/2019 for 11 cycles and current regimen of with carboplatin for an AUC of 5, Alimta 500 mg/m2, and Keytruda 200 mg IV every 3 weeks.  Alimta occasionally held due to renal insufficiency and neuropenia.    Assessment/Plan:  Active Problems:   Malignant neoplasm of right lung (HCC)   Hypokalemia   Acute on chronic anemia   Gastrointestinal hemorrhage   Pancytopenia (HCC)   Renal failure   AKI (acute kidney injury) ([place])   Acute blood loss anemia  Pancytopenia: Likely secondary to pemetrexed toxicity.  Unsure whether she was taking folic acid or not.  Could also be secondary to chemotherapy.  Status post 4 units of packed RBC and 2 units of platelets.  On vitamin B12 injections high-dose IV folic acid.  Granix as per oncology.  Patient will receive 1 unit of platelets today.   CBC Latest Ref Rng & Units 07/03/2020 07/02/2020 07/02/2020  WBC 4.0 - 10.5 K/uL 0.5(LL) 0.4(LL) 0.3(LL)   Hemoglobin 12.0 - 15.0 g/dL 9.5(L) 4.8(LL) 6.0(LL)  Hematocrit 36.0 - 46.0 % 26.9(L) 13.7(L) 16.8(L)  Platelets 150 - 400 K/uL 31(L) 68(L) 82(L)     Acute renal failure:  Again secondary to chemotherapy and severe anemia with possible volume depletion..  Received several liters of IV fluid.  Renal ultrasound was within normal limits.  Maintenance fluids with Ringer lactate at 125 mL/h.  Continue to monitor.  If no improvement might need nephrology evaluation.  Lab Results  Component Value Date   CREATININE 3.97 (H) 07/03/2020   CREATININE 4.96 (H) 07/02/2020   CREATININE 4.59 (H) 07/02/2020    Acute GI bleed.  Patient had RBC scan showing mild acute GI bleeding in the small bowel.  Unlikely to yield from angiogram/ embolization as per GI.Marland Kitchen  Likely mucosal bleeding and recommended platelet and RBC transfusion.  Seen by GI during hospitalization.  Continue Protonix twice daily.  Stage IV lung cancer: Adenocarcinoma.  Status post 2 cycles of pemetrexed and pembrolizumab. oncology on board.  Transfuse if hemoglobin less than 7.   Seizure:  Unclear of etiology.  Possibility of hypoxia.  CT scan without any acute findings.     Lactic acidosis:  Chest x-ray was clear.  UA unremarkable.  Resolved improved tissue perfusion.  No source of infection.  Latest lactate of 1.0.  Significant hypokalemia.  Potassium of 2.7 today.  Will replace with IV KCl.  Magnesium of 1.8.  Check levels in a.m.  Coagulase-negative staph bacteremia.  Only 1  set of blood culture was obtained.  Both aerobic and anaerobic bottles were positive.  Received vancomycin.  Check levels and dose according to ID..  DVT prophylaxis: SCDs Start: 07/01/20 1431  Code Status: Full code  Family Communication: None today   Status is: Inpatient  Remains inpatient appropriate because:IV treatments appropriate due to intensity of illness or inability to take PO and Inpatient level of care appropriate due to severity of  illness   Dispo: The patient is from: Home              Anticipated d/c is to: Home will get PT evaluation              Patient currently is not medically stable to d/c.   Difficult to place patient No   Consultants:  PCCM  Infectious disease  Medical oncology  GI  Procedures:  PRBC transfusion  Platelet transfusion  Anti-infectives:  Marland Kitchen Vancomycin and cefepime for 07/01/20> x1  Anti-infectives (From admission, onward)   Start     Dose/Rate Route Frequency Ordered Stop   07/01/20 1145  ceFEPIme (MAXIPIME) 2 g in sodium chloride 0.9 % 100 mL IVPB        2 g 200 mL/hr over 30 Minutes Intravenous  Once 07/01/20 1140 07/01/20 1243   07/01/20 1145  vancomycin (VANCOCIN) IVPB 1000 mg/200 mL premix        1,000 mg 200 mL/hr over 60 Minutes Intravenous  Once 07/01/20 1140 07/01/20 1359       Subjective: Today, patient was seen and examined at bedside.  Complains of right forearm pain at the injection site.  Denies any nausea vomiting abdominal pain.  Has had loose stools mildly dark..  Denies shortness of breath cough fever chills.  Objective: Vitals:   07/03/20 0600 07/03/20 0630  BP: (!) 98/59 111/60  Pulse:    Resp: (!) 22 18  Temp:    SpO2:      Intake/Output Summary (Last 24 hours) at 07/03/2020 0725 Last data filed at 07/03/2020 0430 Gross per 24 hour  Intake 4134.35 ml  Output 1102 ml  Net 3032.35 ml   Filed Weights   07/01/20 1635 07/02/20 0315 07/03/20 0454  Weight: 52.6 kg 52.9 kg 60 kg   Body mass index is 21.35 kg/m.   Physical Exam: GENERAL: Patient is alert awake and oriented. Not in obvious distress.  Chronically ill, HENT: No scleral pallor or icterus. Pupils equally reactive to light. Oral mucosa is moist.  Poor dentition NECK: is supple, no gross swelling noted. CHEST: Clear to auscultation. No crackles or wheezes.  Diminished breath sounds bilaterally.  Chest wall port without induration or erythema CVS: S1 and S2 heard, no murmur.  Regular rate and rhythm.  ABDOMEN: Soft, non-tender, bowel sounds are present.  Umbilical hernia.  Foley catheter in place. EXTREMITIES: No edema. CNS: Cranial nerves are intact. No focal motor deficits. SKIN: warm and dry without rashes.  Data Review: I have personally reviewed the following laboratory data and studies,  CBC: Recent Labs  Lab 07/01/20 1459 07/02/20 0308 07/02/20 0930 07/02/20 1428 07/02/20 1800 07/02/20 2100 07/03/20 0621  WBC 0.7*   < > 0.5* 0.4* 0.3* 0.4* 0.5*  NEUTROABS 0.1*  --   --  0.0* 0.0* 0.0*  --   HGB 2.4*   < > 7.7* 7.3* 6.0* 4.8* 9.5*  HCT 7.9*   < > 21.7* 20.2* 16.8* 13.7* 26.9*  MCV 125.4*   < > 90.0 89.8 90.8 91.9 85.4  PLT <  5*   < > 44* 37* 82* 68* 31*   < > = values in this interval not displayed.   Basic Metabolic Panel: Recent Labs  Lab 07/01/20 1155 07/01/20 1320 07/02/20 0308 07/02/20 0930 07/03/20 0621  NA QUESTIONABLE RESULTS, RECOMMEND RECOLLECT TO VERIFY 133* 133* 136 137  K QUESTIONABLE RESULTS, RECOMMEND RECOLLECT TO VERIFY 3.9 3.1* 2.9* PENDING  CL QUESTIONABLE RESULTS, RECOMMEND RECOLLECT TO VERIFY 104 105 110 111  CO2 QUESTIONABLE RESULTS, RECOMMEND RECOLLECT TO VERIFY <7* 14* 13* 15*  GLUCOSE QUESTIONABLE RESULTS, RECOMMEND RECOLLECT TO VERIFY 190* 105* 99 101*  BUN QUESTIONABLE RESULTS, RECOMMEND RECOLLECT TO VERIFY 91* 94* 97* 89*  CREATININE QUESTIONABLE RESULTS, RECOMMEND RECOLLECT TO VERIFY 5.56* 4.59* 4.96* 3.97*  CALCIUM QUESTIONABLE RESULTS, RECOMMEND RECOLLECT TO VERIFY 6.7* 7.0* 6.6* 6.8*  MG  --  2.1 1.8  --  1.8  PHOS  --   --  8.9*  --   --    Liver Function Tests: Recent Labs  Lab 07/01/20 1155 07/01/20 1320  AST QUESTIONABLE RESULTS, RECOMMEND RECOLLECT TO VERIFY 68*  ALT QUESTIONABLE RESULTS, RECOMMEND RECOLLECT TO VERIFY 32  ALKPHOS QUESTIONABLE RESULTS, RECOMMEND RECOLLECT TO VERIFY 62  BILITOT QUESTIONABLE RESULTS, RECOMMEND RECOLLECT TO VERIFY 0.5  PROT QUESTIONABLE RESULTS, RECOMMEND RECOLLECT  TO VERIFY 4.4*  ALBUMIN QUESTIONABLE RESULTS, RECOMMEND RECOLLECT TO VERIFY 1.4*   Recent Labs  Lab 07/01/20 1155 07/01/20 1320  LIPASE QUESTIONABLE RESULTS, RECOMMEND RECOLLECT TO VERIFY 55*   No results for input(s): AMMONIA in the last 168 hours. Cardiac Enzymes: No results for input(s): CKTOTAL, CKMB, CKMBINDEX, TROPONINI in the last 168 hours. BNP (last 3 results) No results for input(s): BNP in the last 8760 hours.  ProBNP (last 3 results) No results for input(s): PROBNP in the last 8760 hours.  CBG: Recent Labs  Lab 07/02/20 1219 07/02/20 1617 07/02/20 1929 07/03/20 0004 07/03/20 0401  GLUCAP 85 83 103* 100* 113*   Recent Results (from the past 240 hour(s))  Blood culture (routine single)     Status: None (Preliminary result)   Collection Time: 07/01/20 11:53 AM   Specimen: BLOOD  Result Value Ref Range Status   Specimen Description   Final    BLOOD LEFT ANTECUBITAL Performed at [facility], [place] [address] [place], [place] [address]    Special Requests   Final    BOTTLES DRAWN AEROBIC AND ANAEROBIC Blood Culture adequate volume Performed at [place] [address], [place], [place] [address]    Culture  Setup Time   Final    GRAM POSITIVE COCCI IN CLUSTERS IN BOTH AEROBIC AND ANAEROBIC BOTTLES CRITICAL RESULT CALLED TO, READ BACK BY AND VERIFIED WITH: Tilton [place] AT [place] ON 07/02/20 Performed at [facility], [place] [address], [place], [place] [address]    Culture GRAM POSITIVE COCCI IN CLUSTERS  Final   Report Status PENDING  Incomplete  Blood Culture ID Panel (Reflexed)     Status: Abnormal   Collection Time: 07/01/20 11:53 AM  Result Value Ref Range Status   Enterococcus faecalis NOT DETECTED NOT DETECTED Final   Enterococcus Faecium NOT DETECTED NOT DETECTED Final   Listeria monocytogenes NOT DETECTED NOT DETECTED Final   Staphylococcus species DETECTED (A) NOT DETECTED Final    Comment:  CRITICAL RESULT CALLED TO, READ BACK BY AND VERIFIED WITH: PHARMD JUSTIN LEGGE BY KJ AT 1012 ON [id number]    Staphylococcus aureus (BCID) NOT DETECTED NOT DETECTED Final   Staphylococcus epidermidis  NOT DETECTED NOT DETECTED Final   Staphylococcus lugdunensis NOT DETECTED NOT DETECTED Final   Streptococcus species NOT DETECTED NOT DETECTED Final   Streptococcus agalactiae NOT DETECTED NOT DETECTED Final   Streptococcus pneumoniae NOT DETECTED NOT DETECTED Final   Streptococcus pyogenes NOT DETECTED NOT DETECTED Final   A.calcoaceticus-baumannii NOT DETECTED NOT DETECTED Final   Bacteroides fragilis NOT DETECTED NOT DETECTED Final   Enterobacterales NOT DETECTED NOT DETECTED Final   Enterobacter cloacae complex NOT DETECTED NOT DETECTED Final   Escherichia coli NOT DETECTED NOT DETECTED Final   Klebsiella aerogenes NOT DETECTED NOT DETECTED Final   Klebsiella oxytoca NOT DETECTED NOT DETECTED Final   Klebsiella pneumoniae NOT DETECTED NOT DETECTED Final   Proteus species NOT DETECTED NOT DETECTED Final   Salmonella species NOT DETECTED NOT DETECTED Final   Serratia marcescens NOT DETECTED NOT DETECTED Final   Haemophilus influenzae NOT DETECTED NOT DETECTED Final   Neisseria meningitidis NOT DETECTED NOT DETECTED Final   Pseudomonas aeruginosa NOT DETECTED NOT DETECTED Final   Stenotrophomonas maltophilia NOT DETECTED NOT DETECTED Final   Candida albicans NOT DETECTED NOT DETECTED Final   Candida auris NOT DETECTED NOT DETECTED Final   Candida glabrata NOT DETECTED NOT DETECTED Final   Candida krusei NOT DETECTED NOT DETECTED Final   Candida parapsilosis NOT DETECTED NOT DETECTED Final   Candida tropicalis NOT DETECTED NOT DETECTED Final   Cryptococcus neoformans/gattii NOT DETECTED NOT DETECTED Final    Comment: Performed at [facility], [address], [place], [place] [address]  Resp Panel by RT-PCR (Flu A&B, Covid) Nasopharyngeal Swab     Status: None   Collection Time:  07/01/20  2:05 PM   Specimen: Nasopharyngeal Swab; Nasopharyngeal(NP) swabs in vial transport medium  Result Value Ref Range Status   SARS Coronavirus 2 by RT PCR NEGATIVE NEGATIVE Final    Comment: (NOTE) SARS-CoV-2 target nucleic acids are NOT DETECTED.  The SARS-CoV-2 RNA is generally detectable in upper respiratory specimens during the acute phase of infection. The lowest concentration of SARS-CoV-2 viral copies this assay can detect is 138 copies/mL. A negative result does not preclude SARS-Cov-2 infection and should not be used as the sole basis for treatment or other patient management decisions. A negative result may occur with  improper specimen collection/handling, submission of specimen other than nasopharyngeal swab, presence of viral mutation(s) within the areas targeted by this assay, and inadequate number of viral copies(<138 copies/mL). A negative result must be combined with clinical observations, patient history, and epidemiological information. The expected result is Negative.  Fact Sheet for Patients:  EntrepreneurPulse.com.au  Fact Sheet for Healthcare Providers:  IncredibleEmployment.be  This test is no t yet approved or cleared by the [place] FDA and  has been authorized for detection and/or diagnosis of SARS-CoV-2 by FDA under an Emergency Use Authorization (EUA). This EUA will remain  in effect (meaning this test can be used) for the duration of the COVID-19 declaration under Section 564(b)(1) of the Act, 21 U.S.C.section 360bbb-3(b)(1), unless the authorization is terminated  or revoked sooner.       Influenza A by PCR NEGATIVE NEGATIVE Final   Influenza B by PCR NEGATIVE NEGATIVE Final    Comment: (NOTE) The Xpert Xpress SARS-CoV-2/FLU/RSV plus assay is intended as an aid in the diagnosis of influenza from Nasopharyngeal swab specimens and should not be used as a sole basis for treatment. Nasal washings  and aspirates are unacceptable for Xpert Xpress SARS-CoV-2/FLU/RSV testing.  Fact Sheet for Patients:  EntrepreneurPulse.com.au  Fact Sheet for Healthcare Providers: IncredibleEmployment.be  This test is not yet approved or cleared by the [place] FDA and has been authorized for detection and/or diagnosis of SARS-CoV-2 by FDA under an Emergency Use Authorization (EUA). This EUA will remain in effect (meaning this test can be used) for the duration of the COVID-19 declaration under Section 564(b)(1) of the Act, 21 U.S.C. section 360bbb-3(b)(1), unless the authorization is terminated or revoked.  Performed at [facility], [place] [address] [place], [place] [address]   MRSA PCR Screening     Status: Abnormal   Collection Time: 07/01/20  4:57 PM   Specimen: Nasopharyngeal  Result Value Ref Range Status   MRSA by PCR POSITIVE (A) NEGATIVE Final    Comment:        The GeneXpert MRSA Assay (FDA approved for NASAL specimens only), is one component of a comprehensive MRSA colonization surveillance program. It is not intended to diagnose MRSA infection nor to guide or monitor treatment for MRSA infections. RESULT CALLED TO, READ BACK BY AND VERIFIED WITH: BARKSDALE,T. RN @1932  ON 04.11.2022 BY COHEN,K Performed at [facility], [place] [address], [place], [place] [address]      Studies: CT Head Wo Contrast  Result Date: 07/01/2020 CLINICAL DATA:  Recent seizure activity EXAM: CT HEAD WITHOUT CONTRAST TECHNIQUE: Contiguous axial images were obtained from the base of the skull through the vertex without intravenous contrast. COMPARISON:  04/07/2020 FINDINGS: Brain: Mild atrophic changes are noted. No findings to suggest acute hemorrhage, acute infarction or space-occupying mass lesion noted. Vascular: No hyperdense vessel or unexpected calcification. Skull: Normal. Negative for fracture or focal lesion.  Sinuses/Orbits: No acute finding. Chronic soft tissue changes are noted in the left ethmoid sinuses. Other: None. IMPRESSION: Mild atrophic changes without acute abnormality. Electronically Signed   By: Inez Catalina M.D.   On: 07/01/2020 15:05   NM GI Blood Loss  Result Date: 07/02/2020 CLINICAL DATA:  Acute lower GI bleeding, 3 episodes of large volume hematochezia. History of lung cancer EXAM: NUCLEAR MEDICINE GASTROINTESTINAL BLEEDING SCAN TECHNIQUE: Sequential abdominal images were obtained following intravenous administration of Tc-49m labeled red blood cells. RADIOPHARMACEUTICALS:  22.7 mCi Tc-10m pertechnetate in-vitro labeled red cells. COMPARISON:  05/21/2020 CT with contrast FINDINGS: Abnormal activity within the mid and lower abdomen extending into the upper pelvis and peristalsing throughout loops of bowel compatible with mild acute GI bleeding. Based on the pattern of peristalsis, it is difficult to determine location but small bowel is favored over colon. IMPRESSION: Positive exam for mild acute GI bleeding as above. These results will be called to the ordering clinician or representative by the Radiologist Assistant, and communication documented in the PACS or [employer]. Electronically Signed   By: Jerilynn Mages.  Shick M.D.   On: 07/02/2020 12:08   US RENAL  Result Date: 07/01/2020 CLINICAL DATA:  Acute kidney injury. EXAM: RENAL / URINARY TRACT ULTRASOUND COMPLETE COMPARISON:  CT abdomen pelvis dated May 21, 2020. FINDINGS: Right Kidney: Renal measurements: 10.0 x 4.2 x 5.0 cm = volume: 109 mL. Echogenicity within normal limits. No mass or hydronephrosis visualized. Left Kidney: Renal measurements: 9.1 x 5.1 x 5.3 cm = volume: 128 mL. Echogenicity within normal limits. No mass or hydronephrosis visualized. Other: Bilateral pleural effusions. IMPRESSION: 1. Normal renal ultrasound. 2. Bilateral pleural effusions. Electronically Signed   By: Titus Dubin M.D.   On: 07/01/2020 15:55   DG Chest  Port 1 View  Result Date: 07/01/2020 CLINICAL DATA:  Shortness of breath EXAM: PORTABLE CHEST 1 VIEW COMPARISON:  April 04, 2019 FINDINGS: Port-A-Cath tip is in the superior vena cava. No pneumothorax. There is a rather minimal right pleural effusion. No edema or airspace opacity. Heart size and pulmonary vascularity are normal. No adenopathy. No bone lesions. IMPRESSION: Port-A-Cath as noted. Other minimal right pleural effusion. Lungs otherwise clear. Cardiac silhouette normal. Electronically Signed   By: Lowella Grip III M.D.   On: 07/01/2020 11:44     Flora Lipps, MD  [facility] 07/03/2020  If 7PM-7AM, please contact night-coverage

## 2020-07-03 NOTE — Progress Notes (Signed)
[facility] Progress Note Patient Name: Brenda [name] DOB: 20-Feb-1961 MRN: [id number]   Date of Service  07/03/2020  HPI/Events of Note  Patient c/o 10/10 R arm pain related to lab blood draw. Nursing states that the arm is warm and red and hurts to even put ice on it. It has good radial and ulnar pulses and she can wiggle her finger. Nursing states that there is no IV in that arm.   eICU Interventions  Plan: 1. Ultram 50 mg PO X 1 now.      Intervention Category Major Interventions: Other:  Lenard Kampf Cornelia Copa 07/03/2020, 2:26 AM

## 2020-07-03 NOTE — Progress Notes (Signed)
HEMATOLOGY-ONCOLOGY PROGRESS NOTE  SUBJECTIVE: Vertigo.  She is not having any abdominal pain, nausea, vomiting.  She is still having blood in her stool, but less than it was previously.  She is receiving another unit of platelets today.  Oncology History  Malignant neoplasm of right lung ([place])  09/14/2019 Initial Diagnosis   Adenocarcinoma of right lung, stage 4 ([place])   09/28/2019 -  Chemotherapy    Patient is on Treatment Plan: LUNG CARBOPLATIN / PEMETREXED / PEMBROLIZUMAB Q21D INDUCTION X 4 CYCLES / MAINTENANCE PEMETREXED + PEMBROLIZUMAB      04/17/2020 Cancer Staging   Staging form: Lung, AJCC 8th Edition - Clinical: Stage IVA (cT2a, cN2, cM1b) - Signed by Curt Bears, MD on 04/17/2020      REVIEW OF SYSTEMS:   Constitutional: Denies fevers, chills Eyes: Denies blurriness of vision Ears, nose, mouth, throat, and face: Denies mucositis or sore throat Respiratory: Denies cough, dyspnea or wheezes Cardiovascular: Denies palpitation, chest discomfort Gastrointestinal: Denies nausea vomiting.  Reports blood in her stools, but less than previously reported. Skin: Denies abnormal skin rashes Lymphatics: Denies new lymphadenopathy or easy bruising Neurological:Denies numbness, tingling or new weaknesses Behavioral/Psych: Mood is stable, no new changes  Extremities: No lower extremity edema All other systems were reviewed with the patient and are negative.  I have reviewed the past medical history, past surgical history, social history and family history with the patient and they are unchanged from previous note.   PHYSICAL EXAMINATION: ECOG PERFORMANCE STATUS: 2 - Symptomatic, <50% confined to bed  Vitals:   07/03/20 1336 07/03/20 1401  BP: (!) 89/58 (!) 92/54  Pulse: 86 79  Resp:  17  Temp: (!) 97.3 F (36.3 C) (!) 97.4 F (36.3 C)  SpO2: 99% 99%   Filed Weights   07/01/20 1635 07/02/20 0315 07/03/20 0454  Weight: 52.6 kg 52.9 kg 60 kg    Intake/Output from  previous day: 04/12 0701 - 04/13 0700 In: 4134.4 [I.V.:1681; Blood:2255; IV Piggyback:198.3] Out: 1102 [Urine:1100; Stool:2]  GENERAL:alert, no distress and comfortable SKIN: skin color, texture, turgor are normal, no rashes or significant lesions EYES: normal, Conjunctiva are pink and non-injected, sclera clear OROPHARYNX:no exudate, no erythema and lips, buccal mucosa, and tongue normal  LUNGS: clear to auscultation and percussion with normal breathing effort HEART: regular rate & rhythm and no murmurs and no lower extremity edema ABDOMEN:abdomen soft, non-tender and normal bowel sounds NEURO: alert & oriented x 3 with fluent speech, no focal motor/sensory deficits  LABORATORY DATA:  I have reviewed the data as listed CMP Latest Ref Rng & Units 07/03/2020 07/02/2020 07/02/2020  Glucose 70 - 99 mg/dL 101(H) 99 105(H)  BUN 6 - 20 mg/dL 89(H) 97(H) 94(H)  Creatinine 0.44 - 1.00 mg/dL 3.97(H) 4.96(H) 4.59(H)  Sodium 135 - 145 mmol/L 137 136 133(L)  Potassium 3.5 - 5.1 mmol/L 2.7(LL) 2.9(L) 3.1(L)  Chloride 98 - 111 mmol/L 111 110 105  CO2 22 - 32 mmol/L 15(L) 13(L) 14(L)  Calcium 8.9 - 10.3 mg/dL 6.8(L) 6.6(L) 7.0(L)  Total Protein 6.5 - 8.1 g/dL - - -  Total Bilirubin 0.3 - 1.2 mg/dL - - -  Alkaline Phos 38 - 126 U/L - - -  AST 15 - 41 U/L - - -  ALT 0 - 44 U/L - - -    Lab Results  Component Value Date   WBC 0.5 (LL) 07/03/2020   HGB 9.5 (L) 07/03/2020   HCT 26.9 (L) 07/03/2020   MCV 85.4 07/03/2020   PLT  31 (L) 07/03/2020   NEUTROABS 0.0 (LL) 07/02/2020    CT Head Wo Contrast  Result Date: 07/01/2020 CLINICAL DATA:  Recent seizure activity EXAM: CT HEAD WITHOUT CONTRAST TECHNIQUE: Contiguous axial images were obtained from the base of the skull through the vertex without intravenous contrast. COMPARISON:  04/07/2020 FINDINGS: Brain: Mild atrophic changes are noted. No findings to suggest acute hemorrhage, acute infarction or space-occupying mass lesion noted. Vascular: No  hyperdense vessel or unexpected calcification. Skull: Normal. Negative for fracture or focal lesion. Sinuses/Orbits: No acute finding. Chronic soft tissue changes are noted in the left ethmoid sinuses. Other: None. IMPRESSION: Mild atrophic changes without acute abnormality. Electronically Signed   By: Inez Catalina M.D.   On: 07/01/2020 15:05   NM GI Blood Loss  Result Date: 07/02/2020 CLINICAL DATA:  Acute lower GI bleeding, 3 episodes of large volume hematochezia. History of lung cancer EXAM: NUCLEAR MEDICINE GASTROINTESTINAL BLEEDING SCAN TECHNIQUE: Sequential abdominal images were obtained following intravenous administration of Tc-55mlabeled red blood cells. RADIOPHARMACEUTICALS:  22.7 mCi Tc-939mertechnetate in-vitro labeled red cells. COMPARISON:  05/21/2020 CT with contrast FINDINGS: Abnormal activity within the mid and lower abdomen extending into the upper pelvis and peristalsing throughout loops of bowel compatible with mild acute GI bleeding. Based on the pattern of peristalsis, it is difficult to determine location but small bowel is favored over colon. IMPRESSION: Positive exam for mild acute GI bleeding as above. These results will be called to the ordering clinician or representative by the Radiologist Assistant, and communication documented in the PACS or Cl[employer] CorporationElectronically Signed   By: M.Jerilynn Mages Shick M.D.   On: 07/02/2020 12:08   [place]  Result Date: 07/01/2020 CLINICAL DATA:  Acute kidney injury. EXAM: RENAL / URINARY TRACT ULTRASOUND COMPLETE COMPARISON:  CT abdomen pelvis dated May 21, 2020. FINDINGS: Right Kidney: Renal measurements: 10.0 x 4.2 x 5.0 cm = volume: 109 mL. Echogenicity within normal limits. No mass or hydronephrosis visualized. Left Kidney: Renal measurements: 9.1 x 5.1 x 5.3 cm = volume: 128 mL. Echogenicity within normal limits. No mass or hydronephrosis visualized. Other: Bilateral pleural effusions. IMPRESSION: 1. Normal renal ultrasound. 2. Bilateral  pleural effusions. Electronically Signed   By: WiTitus Dubin.D.   On: 07/01/2020 15:55   DG Chest Port 1 View  Result Date: 07/01/2020 CLINICAL DATA:  Shortness of breath EXAM: PORTABLE CHEST 1 VIEW COMPARISON:  April 04, 2019 FINDINGS: Port-A-Cath tip is in the superior vena cava. No pneumothorax. There is a rather minimal right pleural effusion. No edema or airspace opacity. Heart size and pulmonary vascularity are normal. No adenopathy. No bone lesions. IMPRESSION: Port-A-Cath as noted. Other minimal right pleural effusion. Lungs otherwise clear. Cardiac silhouette normal. Electronically Signed   By: WiLowella GripII M.D.   On: 07/01/2020 11:44    ASSESSMENT AND PLAN: This is a [age]ear old African-American female with stage IV (T2a, N2, M1b) non-small cell lung cancer, adenocarcinoma presented with a large right upper lobe lung mass in addition to suspicious right paratracheal lymphadenopathy and bilateral pulmonary nodules as well as retroperitoneal lymphadenopathy diagnosed in May 2021.  She has no actionable mutations and is negative for PD-L1.  The patient started palliative systemic chemotherapy with carboplatin for an AUC of 5, Alimta 500 mg/m, and Keytruda 200 mg IV every 3 weeks.  Starting from cycle #5, she has been on maintenance Alimta and Keytruda.  Her Alimta dose has been dose reduced secondary to neutropenia.  She has also required intermittent holding  of Alimta due to renal insufficiency.  She last received her chemotherapy on 06/18/2020.  The patient is now admitted GI bleed, pancytopenia, AKI, seizure-like activity, and probable sepsis with ?staph bacteremia.  Bleeding scan showed mild acute GI bleeding thought to be small bowel in origin. Clinically, she appears improved.  Recommend continuation of Granix 300 mcg subcu daily until [place] is above 1.5.  Recommend PRBC transfusion for hemoglobin less than 8 and transfuse platelets for platelet count less than 20,000 or  active bleeding.  We will plan to see the patient back in the office after discharge to discuss further treatment.  We will consider discontinuing Alimta and continuing maintenance immunotherapy with Keytruda only.   LOS: 2 days   Mikey Bussing, DNP, AGPCNP-BC, AOCNP 07/03/20

## 2020-07-03 NOTE — Progress Notes (Signed)
Pharmacy Antibiotic Note  Brenda [name] is a 60 y.o. female admitted on 07/01/2020 with possible CoNS bacteremia.  Pharmacy has been consulted for vancomycin dosing. Severe AKI noted on admission (SCr 4x baseline). Vancomycin 1g given in ED on 4/11 at 1300.  Vancomycin random on 4/13 at 0621 was 12, predicted peak of 27 using a weight of 53kg and Vd of 37. However patient is up about 7L since admit per I/O so new Vd predicted to be closer to 42. Difficult to predict an accurate clearance with changing renal function and estimated Vd but estimating a halflife of around ~40 hours. Will redose vancomycin at a dose of 750mg  for a predicted peak of 29. Will hold off on giving dose until we can get repeat BC in hopes to support the thought that this is a contaminant in her blood cultures. It looks like the patient complained of some pain during the draw yesterday and they were cancelled.   Plan:  Vancomycin 750 mg x1  Repeat BC  F/u ID recommendations  Height: 5\' 6"  (167.6 cm) Weight: 60 kg (132 lb 4.4 oz) IBW/kg (Calculated) : 59.3  Temp (24hrs), Avg:97.1 F (36.2 C), Min:96.26 F (35.7 C), Max:98.1 F (36.7 C)  Recent Labs  Lab 07/01/20 1151 07/01/20 1155 07/01/20 1320 07/01/20 1416 07/01/20 1459 07/02/20 0308 07/02/20 0930 07/02/20 1428 07/02/20 1800 07/02/20 2100 07/03/20 0621  WBC  --   --  UNABLE TO OBTAIN INSTRUMENT VALUE DUE TO SPECIMEN INTEGRITY.  --    < > 0.7* 0.5* 0.4* 0.3* 0.4* 0.5*  CREATININE  --  QUESTIONABLE RESULTS, RECOMMEND RECOLLECT TO VERIFY 5.56*  --   --  4.59* 4.96*  --   --   --  3.97*  LATICACIDVEN >11.0*  --   --  >11.0*  --  1.0  --   --   --   --   --   VANCORANDOM  --   --   --   --   --   --   --   --   --   --  12   < > = values in this interval not displayed.    Estimated Creatinine Clearance: 14.6 mL/min (A) (by C-G formula based on SCr of 3.97 mg/dL (H)).    Allergies  Allergen Reactions  . Aspirin Adult Low [Aspirin] Other (See Comments)     Stomach upset     Thank you for allowing pharmacy to be a part of this patient's care.  Phillis Haggis 07/03/2020 7:31 AM

## 2020-07-03 NOTE — Progress Notes (Signed)
[facility] Gastroenterology Progress Note  Brenda [name] 60 y.o. October 25, 1960  CC:  Hematochezia  Subjective: Patient denies complaints.  Denies abdominal pain, nausea, vomiting.  Tolerating clears.  Had a reddish BM today but states it is less dark than prior.  ROS : Review of Systems  Cardiovascular: Negative for chest pain and palpitations.  Gastrointestinal: Positive for blood in stool. Negative for abdominal pain, constipation, diarrhea, heartburn, melena, nausea and vomiting.    Objective: Vital signs in last 24 hours: Vitals:   07/03/20 0800 07/03/20 0830  BP: (!) 94/57 (!) 99/51  Pulse:    Resp: 19 (!) 21  Temp:  98.1 F (36.7 C)  SpO2: 98%     Physical Exam:  General:  Alert, oriented, cooperative, no distress  Head:  Normocephalic, without obvious abnormality, atraumatic  Eyes:  Anicteric sclera, EOMs intact  Lungs:   Clear to auscultation bilaterally, respirations unlabored  Heart:  Regular rate and rhythm, S1, S2 normal  Abdomen:   Soft, non-tender, non-distended, bowel sounds active all four quadrants,  no guarding or peritoneal signs   Pulses: 2+ and symmetric    Lab Results: Recent Labs    07/02/20 0308 07/02/20 0930 07/03/20 0621  NA 133* 136 137  K 3.1* 2.9* 2.7*  CL 105 110 111  CO2 14* 13* 15*  GLUCOSE 105* 99 101*  BUN 94* 97* 89*  CREATININE 4.59* 4.96* 3.97*  CALCIUM 7.0* 6.6* 6.8*  MG 1.8  --  1.8  PHOS 8.9*  --   --    Recent Labs    07/01/20 1155 07/01/20 1320  AST QUESTIONABLE RESULTS, RECOMMEND RECOLLECT TO VERIFY 68*  ALT QUESTIONABLE RESULTS, RECOMMEND RECOLLECT TO VERIFY 32  ALKPHOS QUESTIONABLE RESULTS, RECOMMEND RECOLLECT TO VERIFY 62  BILITOT QUESTIONABLE RESULTS, RECOMMEND RECOLLECT TO VERIFY 0.5  PROT QUESTIONABLE RESULTS, RECOMMEND RECOLLECT TO VERIFY 4.4*  ALBUMIN QUESTIONABLE RESULTS, RECOMMEND RECOLLECT TO VERIFY 1.4*   Recent Labs    07/02/20 1800 07/02/20 2100 07/03/20 0621  WBC 0.3* 0.4* 0.5*  NEUTROABS 0.0*  0.0*  --   HGB 6.0* 4.8* 9.5*  HCT 16.8* 13.7* 26.9*  MCV 90.8 91.9 85.4  PLT 82* 68* 31*   Recent Labs    07/01/20 1156 07/02/20 0452  LABPROT 16.3* 15.7*  INR 1.4* 1.3*      Assessment: Hematochezia: most likely diffuse mucosal bleeding due to thrombocytopenia. RBC scan 07/02/20 showed mild acute GI bleeding probably in small bowel.  -Hgb 9.5 this morning, improved from 4.8 after 4u (she received 2u pRBCs this morning, 2 units 4/12)  Pancytopenia: WBCs 0/5 K/uL, Platelets 31K/uL  Hypokalemia: K+ 2.7  AKI: BUN 89/ Cr 3.97  Stage IV adenocarcinoma of Rt lung  Plan: Would administer another 1u platelets, given ongoing thrombocytopenia.  Continue to monitor H&H with transfusion as needed to maintain Hgb >7.  Continue supportive care. Continue Protonix IV BID.  [facility] will follow.  Salley Slaughter PA-C 07/03/2020, 9:26 AM  Contact #  [phone]

## 2020-07-03 NOTE — Progress Notes (Signed)
Brenda [name] for Infectious Disease  Date of Admission:  07/01/2020           Reason for visit: Follow up on coag negative staph bacteremia  Current antibiotics: Vancomycin (4/11--present)  Previous antibiotics: Cefepime x1 dose 4/11   ASSESSMENT:    1. Coag negative staph bacteremia: Only 1 set of cultures obtained at admission, however, both aerobic and anaerobic bottles are positive.  She has a indwelling line making this difficult to discern whether this is a true pathogen or a contaminant.  Phlebotomy was unable to obtain further sets of blood cultures yesterday further confounding the issue.  Overall, suspicious that this would be a contaminant given that her presentation appears to be driven primarily by severe pancytopenia as a result of chemotherapy toxicity, but ultimately this is difficult to tell 2. Acute kidney injury: Creatinine today improved 3. Pancytopenia: ANC of 0 4. Stage IV lung cancer 5. GI bleed: Likely due to diffuse mucosal bleeding in the setting of pancytopenia  PLAN:    . We will give another dose of vancomycin today per pharmacy and continue monitoring levels . Anticipate that it will be difficult to definitively attribute this positive blood culture to contaminant.  Given her immunosuppressed state and neutropenia, favor providing a course of antibiotics in the setting of retained chemotherapy port . Will follow   Principal Problem:   Bacteremia associated with intravascular line ([place]) Active Problems:   Malignant neoplasm of right lung ([facility])   Hypokalemia   Acute on chronic anemia   Gastrointestinal hemorrhage   Pancytopenia ([facility])   Renal failure   AKI (acute kidney injury) ([place])   Acute blood loss anemia    MEDICATIONS:    Scheduled Meds: . sodium chloride   Intravenous Once  . sodium chloride   Intravenous Once  . Chlorhexidine Gluconate Cloth  6 each Topical Daily  . insulin aspart  0-9 Units Subcutaneous Q4H  . mupirocin  ointment  1 application Nasal BID  . pantoprazole (PROTONIX) IV  40 mg Intravenous Q12H  . Tbo-filgastrim (GRANIX) SQ  300 mcg Subcutaneous q1800   Continuous Infusions: . potassium chloride    . lactated ringers 125 mL/hr at 07/03/20 0728   PRN Meds:.docusate sodium  SUBJECTIVE:   24 hour events:  Hemoglobin levels remained low and receiving blood transfusions Afebrile, hypothermia improving ANC 0 Creatinine improved Potassium 2.7  Patient reports today feeling better than yesterday.  She denies any fevers.  She is tolerating antibiotics.  She notes right forearm discomfort at phlebotomy site.  No nausea, vomiting, abdominal pain.  No respiratory symptoms     OBJECTIVE:   Blood pressure (!) 89/58, pulse 86, temperature (!) 97.3 F (36.3 C), temperature source Oral, resp. rate 19, height 5\' 6"  (1.676 m), weight 60 kg, last menstrual period 01/19/2011, SpO2 99 %. Body mass index is 21.35 kg/m.  Physical Exam Constitutional:      General: She is not in acute distress.    Appearance: Normal appearance.  HENT:     Mouth/Throat:     Mouth: Mucous membranes are moist.     Pharynx: Oropharynx is clear.     Comments: Dentition is poor Eyes:     Extraocular Movements: Extraocular movements intact.     Conjunctiva/sclera: Conjunctivae normal.  Cardiovascular:     Comments: Right chest wall port without erythema or tenderness Pulmonary:     Effort: Pulmonary effort is normal. No respiratory distress.  Abdominal:     Palpations:  Abdomen is soft.     Tenderness: There is no abdominal tenderness.  Neurological:     General: No focal deficit present.     Mental Status: She is alert and oriented to person, place, and time.  Psychiatric:        Mood and Affect: Mood normal.        Behavior: Behavior normal.      Lab Results: Lab Results  Component Value Date   WBC 0.5 (LL) 07/03/2020   HGB 9.5 (L) 07/03/2020   HCT 26.9 (L) 07/03/2020   MCV 85.4 07/03/2020   PLT 31 (L)  07/03/2020    Lab Results  Component Value Date   NA 137 07/03/2020   K 2.7 (LL) 07/03/2020   CO2 15 (L) 07/03/2020   GLUCOSE 101 (H) 07/03/2020   BUN 89 (H) 07/03/2020   CREATININE 3.97 (H) 07/03/2020   CALCIUM 6.8 (L) 07/03/2020   GFRNONAA 13 (L) 07/03/2020   GFRAA  07/01/2020    QUESTIONABLE RESULTS, RECOMMEND RECOLLECT TO VERIFY    Lab Results  Component Value Date   ALT 32 07/01/2020   AST 68 (H) 07/01/2020   ALKPHOS 62 07/01/2020   BILITOT 0.5 07/01/2020    No results found for: CRP  No results found for: ESRSEDRATE   I have reviewed the micro and lab results in Epic.  Imaging: CT Head Wo Contrast  Result Date: 07/01/2020 CLINICAL DATA:  Recent seizure activity EXAM: CT HEAD WITHOUT CONTRAST TECHNIQUE: Contiguous axial images were obtained from the base of the skull through the vertex without intravenous contrast. COMPARISON:  04/07/2020 FINDINGS: Brain: Mild atrophic changes are noted. No findings to suggest acute hemorrhage, acute infarction or space-occupying mass lesion noted. Vascular: No hyperdense vessel or unexpected calcification. Skull: Normal. Negative for fracture or focal lesion. Sinuses/Orbits: No acute finding. Chronic soft tissue changes are noted in the left ethmoid sinuses. Other: None. IMPRESSION: Mild atrophic changes without acute abnormality. Electronically Signed   By: Inez Catalina M.D.   On: 07/01/2020 15:05   NM GI Blood Loss  Result Date: 07/02/2020 CLINICAL DATA:  Acute lower GI bleeding, 3 episodes of large volume hematochezia. History of lung cancer EXAM: NUCLEAR MEDICINE GASTROINTESTINAL BLEEDING SCAN TECHNIQUE: Sequential abdominal images were obtained following intravenous administration of Tc-66m labeled red blood cells. RADIOPHARMACEUTICALS:  22.7 mCi Tc-75m pertechnetate in-vitro labeled red cells. COMPARISON:  05/21/2020 CT with contrast FINDINGS: Abnormal activity within the mid and lower abdomen extending into the upper pelvis and  peristalsing throughout loops of bowel compatible with mild acute GI bleeding. Based on the pattern of peristalsis, it is difficult to determine location but small bowel is favored over colon. IMPRESSION: Positive exam for mild acute GI bleeding as above. These results will be called to the ordering clinician or representative by the Radiologist Assistant, and communication documented in the PACS or [employer]. Electronically Signed   By: Jerilynn Mages.  Shick M.D.   On: 07/02/2020 12:08   US RENAL  Result Date: 07/01/2020 CLINICAL DATA:  Acute kidney injury. EXAM: RENAL / URINARY TRACT ULTRASOUND COMPLETE COMPARISON:  CT abdomen pelvis dated May 21, 2020. FINDINGS: Right Kidney: Renal measurements: 10.0 x 4.2 x 5.0 cm = volume: 109 mL. Echogenicity within normal limits. No mass or hydronephrosis visualized. Left Kidney: Renal measurements: 9.1 x 5.1 x 5.3 cm = volume: 128 mL. Echogenicity within normal limits. No mass or hydronephrosis visualized. Other: Bilateral pleural effusions. IMPRESSION: 1. Normal renal ultrasound. 2. Bilateral pleural effusions. Electronically Signed  By: Titus Dubin M.D.   On: 07/01/2020 15:55     Imaging independently reviewed in Epic.    Raynelle Highland for Infectious Disease [facility] [phone] pager 07/03/2020, 1:57 PM

## 2020-07-04 DIAGNOSIS — N179 Acute kidney failure, unspecified: Secondary | ICD-10-CM | POA: Diagnosis not present

## 2020-07-04 DIAGNOSIS — T827XXD Infection and inflammatory reaction due to other cardiac and vascular devices, implants and grafts, subsequent encounter: Secondary | ICD-10-CM | POA: Diagnosis not present

## 2020-07-04 DIAGNOSIS — C3491 Malignant neoplasm of unspecified part of right bronchus or lung: Secondary | ICD-10-CM | POA: Diagnosis not present

## 2020-07-04 DIAGNOSIS — D61818 Other pancytopenia: Secondary | ICD-10-CM | POA: Diagnosis not present

## 2020-07-04 DIAGNOSIS — D649 Anemia, unspecified: Secondary | ICD-10-CM

## 2020-07-04 LAB — PREPARE PLATELET PHERESIS
Unit division: 0
Unit division: 0

## 2020-07-04 LAB — CBC WITH DIFFERENTIAL/PLATELET
Abs Immature Granulocytes: 0 10*3/uL (ref 0.00–0.07)
Basophils Absolute: 0 10*3/uL (ref 0.0–0.1)
Basophils Relative: 0 %
Eosinophils Absolute: 0 10*3/uL (ref 0.0–0.5)
Eosinophils Relative: 0 %
HCT: 25.7 % — ABNORMAL LOW (ref 36.0–46.0)
Hemoglobin: 8.9 g/dL — ABNORMAL LOW (ref 12.0–15.0)
Immature Granulocytes: 0 %
Lymphocytes Relative: 89 %
Lymphs Abs: 0.5 10*3/uL — ABNORMAL LOW (ref 0.7–4.0)
MCH: 30.4 pg (ref 26.0–34.0)
MCHC: 34.6 g/dL (ref 30.0–36.0)
MCV: 87.7 fL (ref 80.0–100.0)
Monocytes Absolute: 0 10*3/uL — ABNORMAL LOW (ref 0.1–1.0)
Monocytes Relative: 2 %
Neutro Abs: 0.1 10*3/uL — CL (ref 1.7–7.7)
Neutrophils Relative %: 9 %
Platelets: 38 10*3/uL — ABNORMAL LOW (ref 150–400)
RBC: 2.93 MIL/uL — ABNORMAL LOW (ref 3.87–5.11)
RDW: 15.3 % (ref 11.5–15.5)
WBC: 0.5 10*3/uL — CL (ref 4.0–10.5)
nRBC: 0 % (ref 0.0–0.2)

## 2020-07-04 LAB — BPAM PLATELET PHERESIS
Blood Product Expiration Date: 202204162359
Blood Product Expiration Date: 202204162359
ISSUE DATE / TIME: 202204131341
ISSUE DATE / TIME: 202204132024
Unit Type and Rh: 6200
Unit Type and Rh: 5100

## 2020-07-04 LAB — CBC
HCT: 25.1 % — ABNORMAL LOW (ref 36.0–46.0)
HCT: 24.9 % — ABNORMAL LOW (ref 36.0–46.0)
Hemoglobin: 8.8 g/dL — ABNORMAL LOW (ref 12.0–15.0)
Hemoglobin: 8.7 g/dL — ABNORMAL LOW (ref 12.0–15.0)
MCH: 30.2 pg (ref 26.0–34.0)
MCH: 30.2 pg (ref 26.0–34.0)
MCHC: 35.1 g/dL (ref 30.0–36.0)
MCHC: 34.9 g/dL (ref 30.0–36.0)
MCV: 86.3 fL (ref 80.0–100.0)
MCV: 86.5 fL (ref 80.0–100.0)
Platelets: 25 10*3/uL — CL (ref 150–400)
Platelets: 30 10*3/uL — ABNORMAL LOW (ref 150–400)
RBC: 2.91 MIL/uL — ABNORMAL LOW (ref 3.87–5.11)
RBC: 2.88 MIL/uL — ABNORMAL LOW (ref 3.87–5.11)
RDW: 15.4 % (ref 11.5–15.5)
RDW: 15.3 % (ref 11.5–15.5)
WBC: 0.6 10*3/uL — CL (ref 4.0–10.5)
WBC: 0.6 10*3/uL — CL (ref 4.0–10.5)
nRBC: 0 % (ref 0.0–0.2)
nRBC: 0 % (ref 0.0–0.2)

## 2020-07-04 LAB — URINE CULTURE: Culture: NO GROWTH

## 2020-07-04 LAB — COMPREHENSIVE METABOLIC PANEL
ALT: 28 U/L (ref 0–44)
AST: 39 U/L (ref 15–41)
Albumin: 1.4 g/dL — ABNORMAL LOW (ref 3.5–5.0)
Alkaline Phosphatase: 53 U/L (ref 38–126)
Anion gap: 8 (ref 5–15)
BUN: 83 mg/dL — ABNORMAL HIGH (ref 6–20)
CO2: 16 mmol/L — ABNORMAL LOW (ref 22–32)
Calcium: 7.1 mg/dL — ABNORMAL LOW (ref 8.9–10.3)
Chloride: 115 mmol/L — ABNORMAL HIGH (ref 98–111)
Creatinine, Ser: 3.27 mg/dL — ABNORMAL HIGH (ref 0.44–1.00)
GFR, Estimated: 16 mL/min — ABNORMAL LOW (ref >60)
Glucose, Bld: 93 mg/dL (ref 70–99)
Potassium: 3.8 mmol/L (ref 3.5–5.1)
Sodium: 139 mmol/L (ref 135–145)
Total Bilirubin: 0.7 mg/dL (ref 0.3–1.2)
Total Protein: 4.1 g/dL — ABNORMAL LOW (ref 6.5–8.1)

## 2020-07-04 LAB — VANCOMYCIN, RANDOM: Vancomycin Rm: 19

## 2020-07-04 LAB — GLUCOSE, CAPILLARY
Glucose-Capillary: 89 mg/dL (ref 70–99)
Glucose-Capillary: 86 mg/dL (ref 70–99)
Glucose-Capillary: 101 mg/dL — ABNORMAL HIGH (ref 70–99)
Glucose-Capillary: 87 mg/dL (ref 70–99)
Glucose-Capillary: 95 mg/dL (ref 70–99)

## 2020-07-04 LAB — PHOSPHORUS: Phosphorus: 4.9 mg/dL — ABNORMAL HIGH (ref 2.5–4.6)

## 2020-07-04 LAB — MAGNESIUM: Magnesium: 1.8 mg/dL (ref 1.7–2.4)

## 2020-07-04 MED ORDER — VANCOMYCIN HCL 750 MG/150ML IV SOLN
750.0000 mg | Freq: Once | INTRAVENOUS | Status: AC
  Administered 2020-07-05: 750 mg via INTRAVENOUS
  Filled 2020-07-04: qty 150

## 2020-07-04 MED ORDER — SODIUM CHLORIDE 0.9% IV SOLUTION: Freq: Once | INTRAVENOUS | Status: AC

## 2020-07-04 NOTE — Progress Notes (Addendum)
PROGRESS NOTE  Brenda [name] YSA:[id number] DOB: Sep 01, 1960 DOA: 07/01/2020 PCP: Pcp, No   LOS: 3 days   Brief narrative:  Brenda [name] is a 60 years old female with past medical history of stage IV non-small cell lung cancer- adenocarcinoma of right lung, depression undergoing chemotherapy with Dr. Julien Nordmann presented to the hospital with epistaxis, dark stools and generalized weakness for few days.  On initial presentation, blood pressure was stable but patient was mildly hypothermic CXR showed Port-A-Cath in place and minimal right pleural effusion but otherwise clear.  Her lactic acid was > 11, with INR 1.4 and had AKI with  creatinine over 5 without hyperkalemia and metabolic acidosis.  Patient received PRBC in the ED and was more somnolent and had focal seizure-like activity.  She was given Ativan and Keppra and head CT was done.  Bear hugger was initiated and the patient was admitted to the hospital.  Hemoglobin was around 2.4 subsequently and was given packed RBC.  Of note patient was initiated on chemotherapy on 09/28/2019 for 11 cycles and current regimen of with carboplatin for an AUC of 5, Alimta 500 mg/m2, and Keytruda 200 mg IV every 3 weeks.  Alimta was held due to renal insufficiency and neuropenia.    During hospitalization, patient was seen by GI and oncology.  Patient has been having pancytopenia with ongoing bleeding and conservative management has been pursued at this time.  Assessment/Plan:  Principal Problem:   Bacteremia associated with intravascular line ([place]) Active Problems:   Malignant neoplasm of right lung (HCC)   Hypokalemia   Acute on chronic anemia   Gastrointestinal hemorrhage   Pancytopenia (HCC)   Renal failure   AKI (acute kidney injury) ([place])   Acute blood loss anemia  Pancytopenia:  Likely secondary to pemetrexed toxicity.   Status post multiple units of packed RBC and platelet transfusion. On Granix as per oncology.  Oncology recommends Granix daily  until [facility] is above 1.5.  Will transfuse for hemoglobin less than 8 and platelet count less than 20,000 or active bleeding.  CBC Latest Ref Rng & Units 07/04/2020 07/04/2020 07/03/2020  WBC 4.0 - 10.5 K/uL 0.6(LL) 0.5(LL) 0.4(LL)  Hemoglobin 12.0 - 15.0 g/dL 8.8(L) 8.9(L) 7.7(L)  Hematocrit 36.0 - 46.0 % 25.1(L) 25.7(L) 22.4(L)  Platelets 150 - 400 K/uL 25(LL) 38(L) 22(LL)     Acute renal failure:  Likely secondary to chemotherapy and severe anemia with possible volume depletion.  Received several liters of IV fluid during hospitalization..  Renal ultrasound was within normal limits.  Currently on maintenance fluids with Ringer lactate at 125 mL/h.  Continue to monitor.  Creatinine is trending down.  If no improvement might need nephrology evaluation.  Lab Results  Component Value Date   CREATININE 3.27 (H) 07/04/2020   CREATININE 3.97 (H) 07/03/2020   CREATININE 4.96 (H) 07/02/2020    Acute GI bleed.  Patient had RBC scan showing mild acute GI bleeding in the small bowel.  Unlikely to yield much from angiogram/ embolization as per GI.Marland Kitchen  Likely mucosal bleeding and GI recommended platelet and RBC transfusion.  Seen by GI during hospitalization.  Continue Protonix twice daily.  Transfuse as necessary.  Stage IV lung cancer: Adenocarcinoma by biopsy.  Status post 2 cycles of pemetrexed and pembrolizumabm oncology on board.  Transfuse if hemoglobin less than 8 as per oncology.   Seizure:  Unclear of etiology.  Possibility of hypoxia.  CT scan without any acute findings.     Lactic acidosis:  Chest x-ray  was clear.  UA unremarkable.  Resolved improved tissue perfusion.  No source of infection.  Latest lactate of 1.0.  Significant hypokalemia.  Replenished IV KCl.  Potassium of 3.8 today. Check levels in am.   Coagulase-negative staph bacteremia.  Only 1 set of blood culture was obtained.  Both aerobic and anaerobic bottles were positive.  Received vancomycin.  Check levels and dose  according to ID  DVT prophylaxis: SCDs Start: 07/01/20 1431  Code Status: Full code  Family Communication:  I spoke with the patient's friend Mr Elige Ko and updated him about the clinical condition of the patient.   Status is: Inpatient  Remains inpatient appropriate because:IV treatments appropriate due to intensity of illness or inability to take PO and Inpatient level of care appropriate due to severity of illness ongoing GI bleed, severe pancytopenia, concern for bacteremia on IV antibiotics  Dispo: The patient is from: Home              Anticipated d/c is to: Home likely, will get PT evaluation.  Continue to monitor closely.  Will need to follow GI oncology recommendations.              Patient currently is not medically stable to d/c.   Difficult to place patient No  Consultants:  PCCM  Infectious disease  Medical oncology  GI  Procedures:  PRBC transfusion  Platelet transfusion  Anti-infectives:  Anti-infectives (From admission, onward)   Start     Dose/Rate Route Frequency Ordered Stop   07/03/20 1030  vancomycin (VANCOREADY) IVPB 750 mg/150 mL        750 mg 150 mL/hr over 60 Minutes Intravenous  Once 07/03/20 0942 07/03/20 1155   07/01/20 1145  ceFEPIme (MAXIPIME) 2 g in sodium chloride 0.9 % 100 mL IVPB        2 g 200 mL/hr over 30 Minutes Intravenous  Once 07/01/20 1140 07/01/20 1243   07/01/20 1145  vancomycin (VANCOCIN) IVPB 1000 mg/200 mL premix        1,000 mg 200 mL/hr over 60 Minutes Intravenous  Once 07/01/20 1140 07/01/20 1359     Subjective: Today, patient was seen and examined at bedside.  Complains of right forearm pain. Has been having bleeding in the rectum. No nausea, vomiting or abdominal pain.  Objective: Vitals:   07/04/20 0500 07/04/20 0700  BP: (!) 100/55 (!) 111/58  Pulse:    Resp: 17 19  Temp:    SpO2:  98%    Intake/Output Summary (Last 24 hours) at 07/04/2020 0745 Last data filed at 07/04/2020 0600 Gross per 24  hour  Intake 5051.76 ml  Output 2300 ml  Net 2751.76 ml   Filed Weights   07/02/20 0315 07/03/20 0454 07/04/20 0500  Weight: 52.9 kg 60 kg 60 kg   Body mass index is 21.35 kg/m.   Physical Exam:  General:  Average built, not in obvious distress, chronically ill HENT:   Mild pallor noted. Oral mucosa is moist. Poor dentition. Chest:  Clear breath sounds.  Diminished breath sounds bilaterally. No crackles or wheezes.  Right chest wall port in place without induration and erythema. CVS: S1 &S2 heard. No murmur.  Regular rate and rhythm. Abdomen: Soft, nontender, nondistended.  Small umbilical hernia.  Bowel sounds are heard.   Extremities: No cyanosis, clubbing or edema.  Peripheral pulses are palpable. Psych: Alert, awake and oriented, normal mood CNS:  No cranial nerve deficits.  Power equal in all extremities.   Skin: Warm and  dry.  No rashes noted.   Data Review: I have personally reviewed the following laboratory data and studies,  CBC: Recent Labs  Lab 07/01/20 1459 07/02/20 0308 07/02/20 1428 07/02/20 1800 07/02/20 2100 07/03/20 0621 07/03/20 1630 07/04/20 0000 07/04/20 0538  WBC 0.7*   < > 0.4* 0.3* 0.4* 0.5* 0.4* 0.5* 0.6*  NEUTROABS 0.1*  --  0.0* 0.0* 0.0*  --   --  0.1*  --   HGB 2.4*   < > 7.3* 6.0* 4.8* 9.5* 7.7* 8.9* 8.8*  HCT 7.9*   < > 20.2* 16.8* 13.7* 26.9* 22.4* 25.7* 25.1*  MCV 125.4*   < > 89.8 90.8 91.9 85.4 87.5 87.7 86.3  PLT <5*   < > 37* 82* 68* 31* 22* 38* 25*   < > = values in this interval not displayed.   Basic Metabolic Panel: Recent Labs  Lab 07/01/20 1320 07/02/20 0308 07/02/20 0930 07/03/20 0621 07/04/20 0538  NA 133* 133* 136 137 139  K 3.9 3.1* 2.9* 2.7* 3.8  CL 104 105 110 111 115*  CO2 <7* 14* 13* 15* 16*  GLUCOSE 190* 105* 99 101* 93  BUN 91* 94* 97* 89* 83*  CREATININE 5.56* 4.59* 4.96* 3.97* 3.27*  CALCIUM 6.7* 7.0* 6.6* 6.8* 7.1*  MG 2.1 1.8  --  1.8 1.8  PHOS  --  8.9*  --   --  4.9*   Liver Function  Tests: Recent Labs  Lab 07/01/20 1155 07/01/20 1320 07/04/20 0538  AST QUESTIONABLE RESULTS, RECOMMEND RECOLLECT TO VERIFY 68* 39  ALT QUESTIONABLE RESULTS, RECOMMEND RECOLLECT TO VERIFY 32 28  ALKPHOS QUESTIONABLE RESULTS, RECOMMEND RECOLLECT TO VERIFY 62 53  BILITOT QUESTIONABLE RESULTS, RECOMMEND RECOLLECT TO VERIFY 0.5 0.7  PROT QUESTIONABLE RESULTS, RECOMMEND RECOLLECT TO VERIFY 4.4* 4.1*  ALBUMIN QUESTIONABLE RESULTS, RECOMMEND RECOLLECT TO VERIFY 1.4* 1.4*   Recent Labs  Lab 07/01/20 1155 07/01/20 1320  LIPASE QUESTIONABLE RESULTS, RECOMMEND RECOLLECT TO VERIFY 55*   No results for input(s): AMMONIA in the last 168 hours. Cardiac Enzymes: No results for input(s): CKTOTAL, CKMB, CKMBINDEX, TROPONINI in the last 168 hours. BNP (last 3 results) No results for input(s): BNP in the last 8760 hours.  ProBNP (last 3 results) No results for input(s): PROBNP in the last 8760 hours.  CBG: Recent Labs  Lab 07/03/20 1533 07/03/20 1941 07/03/20 2322 07/04/20 0415 07/04/20 0722  GLUCAP 87 81 98 89 86   Recent Results (from the past 240 hour(s))  Blood culture (routine single)     Status: Abnormal (Preliminary result)   Collection Time: 07/01/20 11:53 AM   Specimen: BLOOD  Result Value Ref Range Status   Specimen Description   Final    BLOOD LEFT ANTECUBITAL Performed at [facility], [place] [address] [place], [place] [address]    Special Requests   Final    BOTTLES DRAWN AEROBIC AND ANAEROBIC Blood Culture adequate volume Performed at [facility] [address] [place], [place] [address]    Culture  Setup Time   Final    GRAM POSITIVE COCCI IN CLUSTERS IN BOTH AEROBIC AND ANAEROBIC BOTTLES CRITICAL RESULT CALLED TO, READ BACK BY AND VERIFIED WITH: PHARMD [place] AT [place] ON 07/02/20    Culture (A)  Final    STAPHYLOCOCCUS HOMINIS THE SIGNIFICANCE OF ISOLATING THIS ORGANISM FROM A SINGLE VENIPUNCTURE CANNOT BE  PREDICTED WITHOUT FURTHER CLINICAL AND CULTURE CORRELATION. SUSCEPTIBILITIES AVAILABLE ONLY ON REQUEST. Performed at [facility], [place]  [address], [place], [place] [address]    Report Status PENDING  Incomplete  Blood Culture ID Panel (Reflexed)     Status: Abnormal   Collection Time: 07/01/20 11:53 AM  Result Value Ref Range Status   Enterococcus faecalis NOT DETECTED NOT DETECTED Final   Enterococcus Faecium NOT DETECTED NOT DETECTED Final   Listeria monocytogenes NOT DETECTED NOT DETECTED Final   Staphylococcus species DETECTED (A) NOT DETECTED Final    Comment: CRITICAL RESULT CALLED TO, READ BACK BY AND VERIFIED WITH: PHARMD JUSTIN LEGGE BY KJ AT 1012 ON [id number]    Staphylococcus aureus (BCID) NOT DETECTED NOT DETECTED Final   Staphylococcus epidermidis NOT DETECTED NOT DETECTED Final   Staphylococcus lugdunensis NOT DETECTED NOT DETECTED Final   Streptococcus species NOT DETECTED NOT DETECTED Final   Streptococcus agalactiae NOT DETECTED NOT DETECTED Final   Streptococcus pneumoniae NOT DETECTED NOT DETECTED Final   Streptococcus pyogenes NOT DETECTED NOT DETECTED Final   A.calcoaceticus-baumannii NOT DETECTED NOT DETECTED Final   Bacteroides fragilis NOT DETECTED NOT DETECTED Final   Enterobacterales NOT DETECTED NOT DETECTED Final   Enterobacter cloacae complex NOT DETECTED NOT DETECTED Final   Escherichia coli NOT DETECTED NOT DETECTED Final   Klebsiella aerogenes NOT DETECTED NOT DETECTED Final   Klebsiella oxytoca NOT DETECTED NOT DETECTED Final   Klebsiella pneumoniae NOT DETECTED NOT DETECTED Final   Proteus species NOT DETECTED NOT DETECTED Final   Salmonella species NOT DETECTED NOT DETECTED Final   Serratia marcescens NOT DETECTED NOT DETECTED Final   Haemophilus influenzae NOT DETECTED NOT DETECTED Final   Neisseria meningitidis NOT DETECTED NOT DETECTED Final   Pseudomonas aeruginosa NOT DETECTED NOT DETECTED Final   Stenotrophomonas maltophilia NOT DETECTED  NOT DETECTED Final   Candida albicans NOT DETECTED NOT DETECTED Final   Candida auris NOT DETECTED NOT DETECTED Final   Candida glabrata NOT DETECTED NOT DETECTED Final   Candida krusei NOT DETECTED NOT DETECTED Final   Candida parapsilosis NOT DETECTED NOT DETECTED Final   Candida tropicalis NOT DETECTED NOT DETECTED Final   Cryptococcus neoformans/gattii NOT DETECTED NOT DETECTED Final    Comment: Performed at [facility], [place]. [address] [place], [place] [address]  Resp Panel by RT-PCR (Flu A&B, Covid) Nasopharyngeal Swab     Status: None   Collection Time: 07/01/20  2:05 PM   Specimen: Nasopharyngeal Swab; Nasopharyngeal(NP) swabs in vial transport medium  Result Value Ref Range Status   SARS Coronavirus 2 by RT PCR NEGATIVE NEGATIVE Final    Comment: (NOTE) SARS-CoV-2 target nucleic acids are NOT DETECTED.  The SARS-CoV-2 RNA is generally detectable in upper respiratory specimens during the acute phase of infection. The lowest concentration of SARS-CoV-2 viral copies this assay can detect is 138 copies/mL. A negative result does not preclude SARS-Cov-2 infection and should not be used as the sole basis for treatment or other patient management decisions. A negative result may occur with  improper specimen collection/handling, submission of specimen other than nasopharyngeal swab, presence of viral mutation(s) within the areas targeted by this assay, and inadequate number of viral copies(<138 copies/mL). A negative result must be combined with clinical observations, patient history, and epidemiological information. The expected result is Negative.  Fact Sheet for Patients:  EntrepreneurPulse.com.au  Fact Sheet for Healthcare Providers:  IncredibleEmployment.be  This test is no t yet approved or cleared by the [place] FDA and  has been authorized for detection and/or diagnosis of SARS-CoV-2 by FDA under an Emergency Use  Authorization (EUA).  This EUA will remain  in effect (meaning this test can be used) for the duration of the COVID-19 declaration under Section 564(b)(1) of the Act, 21 U.S.C.section 360bbb-3(b)(1), unless the authorization is terminated  or revoked sooner.       Influenza A by PCR NEGATIVE NEGATIVE Final   Influenza B by PCR NEGATIVE NEGATIVE Final    Comment: (NOTE) The Xpert Xpress SARS-CoV-2/FLU/RSV plus assay is intended as an aid in the diagnosis of influenza from Nasopharyngeal swab specimens and should not be used as a sole basis for treatment. Nasal washings and aspirates are unacceptable for Xpert Xpress SARS-CoV-2/FLU/RSV testing.  Fact Sheet for Patients: EntrepreneurPulse.com.au  Fact Sheet for Healthcare Providers: IncredibleEmployment.be  This test is not yet approved or cleared by the [place] FDA and has been authorized for detection and/or diagnosis of SARS-CoV-2 by FDA under an Emergency Use Authorization (EUA). This EUA will remain in effect (meaning this test can be used) for the duration of the COVID-19 declaration under Section 564(b)(1) of the Act, 21 U.S.C. section 360bbb-3(b)(1), unless the authorization is terminated or revoked.  Performed at [facility], [place] [address] [place], [place] [address]   MRSA PCR Screening     Status: Abnormal   Collection Time: 07/01/20  4:57 PM   Specimen: Nasopharyngeal  Result Value Ref Range Status   MRSA by PCR POSITIVE (A) NEGATIVE Final    Comment:        The GeneXpert MRSA Assay (FDA approved for NASAL specimens only), is one component of a comprehensive MRSA colonization surveillance program. It is not intended to diagnose MRSA infection nor to guide or monitor treatment for MRSA infections. RESULT CALLED TO, READ BACK BY AND VERIFIED WITH: BARKSDALE,T. RN @1932  ON 04.11.2022 BY COHEN,K Performed at [facility], [place]  [address], [place], [place] [address]      Studies: NM GI Blood Loss  Result Date: 07/02/2020 CLINICAL DATA:  Acute lower GI bleeding, 3 episodes of large volume hematochezia. History of lung cancer EXAM: NUCLEAR MEDICINE GASTROINTESTINAL BLEEDING SCAN TECHNIQUE: Sequential abdominal images were obtained following intravenous administration of Tc-35m labeled red blood cells. RADIOPHARMACEUTICALS:  22.7 mCi Tc-66m pertechnetate in-vitro labeled red cells. COMPARISON:  05/21/2020 CT with contrast FINDINGS: Abnormal activity within the mid and lower abdomen extending into the upper pelvis and peristalsing throughout loops of bowel compatible with mild acute GI bleeding. Based on the pattern of peristalsis, it is difficult to determine location but small bowel is favored over colon. IMPRESSION: Positive exam for mild acute GI bleeding as above. These results will be called to the ordering clinician or representative by the Radiologist Assistant, and communication documented in the PACS or [employer]. Electronically Signed   By: Jerilynn Mages.  Shick M.D.   On: 07/02/2020 12:08     Flora Lipps, MD  [facility] 07/04/2020  If 7PM-7AM, please contact night-coverage

## 2020-07-04 NOTE — Progress Notes (Signed)
Since yesterday, pt has had swelling in her RUE. Upon assessment, arm is tender to touch, warm and reddened. Dr. Louanne Belton notified by this RN. Orders placed for ice packs and elevation Q6H and PRN. No [place] ordered at this time d/t low platelets and bleeding. This RN will continue to carefully monitor pt and response to intervention.

## 2020-07-04 NOTE — Progress Notes (Signed)
Critical lab platelets 25 & WBC 0.6   [facility] on call paged 07/04/20 @ 0630

## 2020-07-04 NOTE — Progress Notes (Signed)
[facility] Gastroenterology Progress Note  Brenda [name] 60 y.o. [birth year]  CC:  Hematochezia  Subjective: Patient reports 2 red BMs this morning.  Denies abdominal pain, nausea, vomiting.  Tolerating clears.     ROS : Review of Systems  Cardiovascular: Negative for chest pain and palpitations.  Gastrointestinal: Positive for blood in stool. Negative for abdominal pain, constipation, diarrhea, heartburn, melena, nausea and vomiting.    Objective: Vital signs in last 24 hours: Vitals:   07/04/20 0700 07/04/20 0800  BP: (!) 111/58   Pulse:  86  Resp: 19   Temp:  98.2 F (36.8 C)  SpO2: 98% 98%    Physical Exam:  General:  Alert, oriented, cooperative, no distress  Head:  Normocephalic, without obvious abnormality, atraumatic  Eyes:  Anicteric sclera, EOMs intact  Lungs:   Respirations unlabored  Heart:  Regular rate and rhythm  Abdomen:   Soft, non-tender, non-distended, bowel sounds active all four quadrants,  no guarding or peritoneal signs   Pulses: 2+ and symmetric    Lab Results: Recent Labs    07/02/20 0308 07/02/20 0930 07/03/20 0621 07/04/20 0538  NA 133*   < > 137 139  K 3.1*   < > 2.7* 3.8  CL 105   < > 111 115*  CO2 14*   < > 15* 16*  GLUCOSE 105*   < > 101* 93  BUN 94*   < > 89* 83*  CREATININE 4.59*   < > 3.97* 3.27*  CALCIUM 7.0*   < > 6.8* 7.1*  MG 1.8  --  1.8 1.8  PHOS 8.9*  --   --  4.9*   < > = values in this interval not displayed.   Recent Labs    07/01/20 1320 07/04/20 0538  AST 68* 39  ALT 32 28  ALKPHOS 62 53  BILITOT 0.5 0.7  PROT 4.4* 4.1*  ALBUMIN 1.4* 1.4*   Recent Labs    07/02/20 2100 07/03/20 0621 07/04/20 0000 07/04/20 0538  WBC 0.4*   < > 0.5* 0.6*  NEUTROABS 0.0*  --  0.1*  --   HGB 4.8*   < > 8.9* 8.8*  HCT 13.7*   < > 25.7* 25.1*  MCV 91.9   < > 87.7 86.3  PLT 68*   < > 38* 25*   < > = values in this interval not displayed.   Recent Labs    07/01/20 1156 07/02/20 0452  LABPROT 16.3* 15.7*  INR 1.4*  1.3*      Assessment: Hematochezia: most likely diffuse mucosal bleeding due to thrombocytopenia. RBC scan 07/02/20 showed mild acute GI bleeding probably in small bowel.  -Hgb 8.9 today.  Improved from 7.7 after 1u.   this morning, improved from 4.8 after 4u (she received 2u pRBCs this morning, 2 units 4/12)  Pancytopenia: WBCs 0.6 K/uL, Platelets 25K/uL  Hypokalemia, resolved  AKI: BUN 83/ Cr 3.27  Stage IV adenocarcinoma of Rt lung  Plan: Would administer another 1u platelets, given ongoing thrombocytopenia and bleeding.  Continue to monitor H&H with transfusion as needed to maintain Hgb >7.  Continue supportive care. Continue Protonix IV BID.  Patient may need enteroscopy if bleeding persists, though this may be diffuse mucosal bleeding (and may not be able to reach area of bleeding)  [facility] will follow.  Salley Slaughter PA-C 07/04/2020, 9:59 AM  Contact #  [phone]

## 2020-07-04 NOTE — Progress Notes (Signed)
[facility] for Infectious Disease  Date of Admission:  07/01/2020           Reason for visit: Follow up on coag negative staph bacteremia  Current antibiotics: Vancomycin (4/11--present)  Previous antibiotics: Cefepime x1 dose 4/11   ASSESSMENT:    1. Coag negative staph bacteremia: Only 1 set of cultures obtained at admission, however, aerobic and anaerobic bottles were positive.  Awaiting susceptibilities.  She has an indwelling line making this difficult to determine whether this is a true pathogen or contaminant.  Phlebotomy was unable to obtain further sets of blood cultures due to difficult venous access further confounding the issue.  Overall, suspicious that this might have been a contaminant given her presentation appears to be driven primarily by severe pancytopenia due to chemotherapy toxicity but ultimately this is difficult to tell and favor giving her course of antibiotics in the setting of immunosuppression, retained chemotherapy port and clinical uncertainty 2. Acute kidney injury: Creatinine improving 3. Pancytopenia: ANC 100 4. Stage IV lung cancer 5. GI bleed: Secondary to diffuse mucosal bleeding  PLAN:    . Continue vancomycin dosed by level . Follow-up susceptibilities . Will follow   Principal Problem:   Bacteremia associated with intravascular line ([place]) Active Problems:   Malignant neoplasm of right lung (HCC)   Hypokalemia   Acute on chronic anemia   Gastrointestinal hemorrhage   Pancytopenia (HCC)   Renal failure   AKI (acute kidney injury) ([place])   Acute blood loss anemia    MEDICATIONS:    Scheduled Meds: . sodium chloride   Intravenous Once  . sodium chloride   Intravenous Once  . Chlorhexidine Gluconate Cloth  6 each Topical Daily  . insulin aspart  0-9 Units Subcutaneous Q4H  . mupirocin ointment  1 application Nasal BID  . pantoprazole (PROTONIX) IV  40 mg Intravenous Q12H  . Tbo-filgastrim (GRANIX) SQ  300 mcg  Subcutaneous q1800   Continuous Infusions: . lactated ringers 125 mL/hr at 07/04/20 0600   PRN Meds:.docusate sodium, Gerhardt's butt cream  SUBJECTIVE:   24 hour events:  No acute events noted overnight Remains pancytopenic, ANC 100 Creatinine improved 3.97 down to 3.27   No acute complaints this morning, denies fevers or chills.  Tolerating antibiotics.  Reports no further bleeding, no chest pain, no shortness of breath or cough     OBJECTIVE:   Blood pressure (!) 111/58, pulse 89, temperature 98.2 F (36.8 C), temperature source Oral, resp. rate 19, height 5\' 6"  (1.676 m), weight 60 kg, last menstrual period 01/19/2011, SpO2 98 %. Body mass index is 21.35 kg/m.  Physical Exam Constitutional:      General: She is not in acute distress.    Appearance: Normal appearance.  HENT:     Head: Normocephalic and atraumatic.  Eyes:     Extraocular Movements: Extraocular movements intact.     Conjunctiva/sclera: Conjunctivae normal.  Cardiovascular:     Comments: Right-sided chemotherapy port without erythema or tenderness Pulmonary:     Effort: Pulmonary effort is normal. No respiratory distress.  Neurological:     General: No focal deficit present.     Mental Status: She is alert and oriented to person, place, and time.  Psychiatric:        Mood and Affect: Mood normal.        Behavior: Behavior normal.      Lab Results: Lab Results  Component Value Date   WBC 0.6 (LL) 07/04/2020   HGB  8.8 (L) 07/04/2020   HCT 25.1 (L) 07/04/2020   MCV 86.3 07/04/2020   PLT 25 (LL) 07/04/2020    Lab Results  Component Value Date   NA 139 07/04/2020   K 3.8 07/04/2020   CO2 16 (L) 07/04/2020   GLUCOSE 93 07/04/2020   BUN 83 (H) 07/04/2020   CREATININE 3.27 (H) 07/04/2020   CALCIUM 7.1 (L) 07/04/2020   GFRNONAA 16 (L) 07/04/2020   GFRAA  07/01/2020    QUESTIONABLE RESULTS, RECOMMEND RECOLLECT TO VERIFY    Lab Results  Component Value Date   ALT 28 07/04/2020   AST 39  07/04/2020   ALKPHOS 53 07/04/2020   BILITOT 0.7 07/04/2020    No results found for: CRP  No results found for: ESRSEDRATE   I have reviewed the micro and lab results in Epic.  Imaging: NM GI Blood Loss  Result Date: 07/02/2020 CLINICAL DATA:  Acute lower GI bleeding, 3 episodes of large volume hematochezia. History of lung cancer EXAM: NUCLEAR MEDICINE GASTROINTESTINAL BLEEDING SCAN TECHNIQUE: Sequential abdominal images were obtained following intravenous administration of Tc-62m labeled red blood cells. RADIOPHARMACEUTICALS:  22.7 mCi Tc-70m pertechnetate in-vitro labeled red cells. COMPARISON:  05/21/2020 CT with contrast FINDINGS: Abnormal activity within the mid and lower abdomen extending into the upper pelvis and peristalsing throughout loops of bowel compatible with mild acute GI bleeding. Based on the pattern of peristalsis, it is difficult to determine location but small bowel is favored over colon. IMPRESSION: Positive exam for mild acute GI bleeding as above. These results will be called to the ordering clinician or representative by the Radiologist Assistant, and communication documented in the PACS or [employer]. Electronically Signed   By: Jerilynn Mages.  Shick M.D.   On: 07/02/2020 12:08     Imaging  independently reviewed in Epic.    [facility]ious [facility] [phone] pager 07/04/2020, 8:24 AM

## 2020-07-04 NOTE — TOC Progression Note (Signed)
Transition of Care [facility]) - Progression Note    Patient Details  Name: Brenda [name] MRN: [id number] Date of Birth: 1960/09/05  Transition of Care [facility] CM/SW Contact  Leeroy Cha, RN Phone Number: 07/04/2020, 7:38 AM  Clinical Narrative:    1. Coag negative staph bacteremia: Only 1 set of cultures obtained at admission, however, both aerobic and anaerobic bottles are positive.  She has a indwelling line making this difficult to discern whether this is a true pathogen or a contaminant.  Phlebotomy was unable to obtain further sets of blood cultures yesterday further confounding the issue.  Overall, suspicious that this would be a contaminant given that her presentation appears to be driven primarily by severe pancytopenia as a result of chemotherapy toxicity, but ultimately this is difficult to tell 2. Acute kidney injury: Creatinine today improved 3. Pancytopenia: ANC of 0 4. Stage IV lung cancer 5. GI bleed: Likely due to diffuse mucosal bleeding in the setting of pancytopenia [id number]=WBC=0.6, Platelets =25, HGB=8.8 BUN=83, CReat 3.27, lactated rings at 125cc/hr. PLAN+ will follow for chemo treatment plan. Progression.   Expected Discharge Plan: [place] Barriers to Discharge: Continued Medical Work up  Expected Discharge Plan and Services Expected Discharge Plan: [place]   Discharge Planning Services: CM Consult   Living arrangements for the past 2 months: Single Family Home                                       Social Determinants of Health (SDOH) Interventions    Readmission Risk Interventions Readmission Risk Prevention Plan 04/11/2020  Transportation Screening Complete  PCP or Specialist Appt within 3-5 Days Complete  HRI or [place] Complete  Social Work Consult for [place] Planning/Counseling Complete  Palliative Care Screening Not Applicable  Medication Review [employer]) Complete  Some  recent data might be hidden

## 2020-07-04 NOTE — Progress Notes (Signed)
Pharmacy Antibiotic Note  Brenda [name] is a 60 y.o. female admitted on 07/01/2020 with possible CoNS bacteremia.  Pharmacy has been consulted for vancomycin dosing. Severe AKI noted on admission (SCr 4x baseline). Vancomycin 1g given in ED on 4/11 at 1300.  Vancomycin random on 4/14 at 1200 was 19. This was approximately 24 hours after she was given a dose of 750 mg in response to a random level of 12. Her renal function is slowly improving but she still has a predicted half-life of 36 hours using a Vd of 42L. We will plan to give her another dose of 750 mg tomorrow morning. The next random level will depend on how her renal function improves. The micro lab is running sensitivities on her coag negative staph species which might allow [place] to transition off vancomycin if it is a sensitive species.   Plan:  Vancomycin 750 mg x1 4/15 at 0600  F/u BC sensitivities   F/u ID recommendations  Height: 5\' 6"  (167.6 cm) Weight: 60 kg (132 lb 4.4 oz) IBW/kg (Calculated) : 59.3  Temp (24hrs), Avg:97.9 F (36.6 C), Min:97.4 F (36.3 C), Max:98.7 F (37.1 C)  Recent Labs  Lab 07/01/20 1151 07/01/20 1155 07/01/20 1320 07/01/20 1416 07/01/20 1459 07/02/20 0308 07/02/20 0930 07/02/20 1428 07/02/20 2100 07/03/20 0621 07/03/20 1630 07/04/20 0000 07/04/20 0538 07/04/20 1200  WBC  --   --  UNABLE TO OBTAIN INSTRUMENT VALUE DUE TO SPECIMEN INTEGRITY.  --    < > 0.7* 0.5*   < > 0.4* 0.5* 0.4* 0.5* 0.6*  --   CREATININE  --    < > 5.56*  --   --  4.59* 4.96*  --   --  3.97*  --   --  3.27*  --   LATICACIDVEN >11.0*  --   --  >11.0*  --  1.0  --   --   --   --   --   --   --   --   VANCORANDOM  --   --   --   --   --   --   --   --   --  12  --   --   --  19   < > = values in this interval not displayed.    Estimated Creatinine Clearance: 17.8 mL/min (A) (by C-G formula based on SCr of 3.27 mg/dL (H)).    Allergies  Allergen Reactions  . Aspirin Adult Low [Aspirin] Other (See Comments)     Stomach upset     Thank you for allowing pharmacy to be a part of this patient's care.  Phillis Haggis 07/04/2020 1:51 PM

## 2020-07-04 NOTE — Progress Notes (Signed)
Critical labs WBC 0.5 & Neutro abs 0.1 called to elink 07/04/20 @ 0055

## 2020-07-05 DIAGNOSIS — T827XXD Infection and inflammatory reaction due to other cardiac and vascular devices, implants and grafts, subsequent encounter: Secondary | ICD-10-CM | POA: Diagnosis not present

## 2020-07-05 DIAGNOSIS — R7881 Bacteremia: Secondary | ICD-10-CM | POA: Diagnosis not present

## 2020-07-05 DIAGNOSIS — K922 Gastrointestinal hemorrhage, unspecified: Secondary | ICD-10-CM | POA: Diagnosis not present

## 2020-07-05 DIAGNOSIS — T827XXA Infection and inflammatory reaction due to other cardiac and vascular devices, implants and grafts, initial encounter: Secondary | ICD-10-CM | POA: Diagnosis not present

## 2020-07-05 DIAGNOSIS — D62 Acute posthemorrhagic anemia: Secondary | ICD-10-CM | POA: Diagnosis not present

## 2020-07-05 DIAGNOSIS — C3490 Malignant neoplasm of unspecified part of unspecified bronchus or lung: Secondary | ICD-10-CM

## 2020-07-05 DIAGNOSIS — N179 Acute kidney failure, unspecified: Secondary | ICD-10-CM | POA: Diagnosis not present

## 2020-07-05 LAB — BPAM RBC
Blood Product Expiration Date: 202205112359
Blood Product Expiration Date: 202205112359
Blood Product Expiration Date: 202205122359
Blood Product Expiration Date: 202205122359
Blood Product Expiration Date: 202205122359
Blood Product Expiration Date: 202205132359
Blood Product Expiration Date: 202205142359
Blood Product Expiration Date: 202205142359
Blood Product Expiration Date: 202205142359
Blood Product Expiration Date: 202205142359
Blood Product Expiration Date: 202205152359
ISSUE DATE / TIME: 202204111500
ISSUE DATE / TIME: 202204111706
ISSUE DATE / TIME: 202204112011
ISSUE DATE / TIME: 202204112236
ISSUE DATE / TIME: 202204122110
ISSUE DATE / TIME: 202204122338
ISSUE DATE / TIME: 202204130211
ISSUE DATE / TIME: 202204131946
Unit Type and Rh: 5100
Unit Type and Rh: 5100
Unit Type and Rh: 5100
Unit Type and Rh: 5100
Unit Type and Rh: 5100
Unit Type and Rh: 5100
Unit Type and Rh: 5100
Unit Type and Rh: 5100
Unit Type and Rh: 5100
Unit Type and Rh: 5100
Unit Type and Rh: 5100

## 2020-07-05 LAB — TYPE AND SCREEN
ABO/RH(D): O POS
Antibody Screen: POSITIVE
Donor AG Type: NEGATIVE
Donor AG Type: NEGATIVE
Donor AG Type: NEGATIVE
Donor AG Type: NEGATIVE
Donor AG Type: NEGATIVE
Donor AG Type: NEGATIVE
Donor AG Type: NEGATIVE
Donor AG Type: NEGATIVE
Donor AG Type: NEGATIVE
Donor AG Type: NEGATIVE
Donor AG Type: NEGATIVE
PT AG Type: NEGATIVE
Unit division: 0
Unit division: 0
Unit division: 0
Unit division: 0
Unit division: 0
Unit division: 0
Unit division: 0
Unit division: 0
Unit division: 0
Unit division: 0
Unit division: 0

## 2020-07-05 LAB — GLUCOSE, CAPILLARY
Glucose-Capillary: 109 mg/dL — ABNORMAL HIGH (ref 70–99)
Glucose-Capillary: 117 mg/dL — ABNORMAL HIGH (ref 70–99)
Glucose-Capillary: 123 mg/dL — ABNORMAL HIGH (ref 70–99)
Glucose-Capillary: 84 mg/dL (ref 70–99)
Glucose-Capillary: 93 mg/dL (ref 70–99)
Glucose-Capillary: 95 mg/dL (ref 70–99)

## 2020-07-05 LAB — CBC
HCT: 24.4 % — ABNORMAL LOW (ref 36.0–46.0)
Hemoglobin: 8.1 g/dL — ABNORMAL LOW (ref 12.0–15.0)
MCH: 29.6 pg (ref 26.0–34.0)
MCHC: 33.2 g/dL (ref 30.0–36.0)
MCV: 89.1 fL (ref 80.0–100.0)
Platelets: 35 10*3/uL — ABNORMAL LOW (ref 150–400)
RBC: 2.74 MIL/uL — ABNORMAL LOW (ref 3.87–5.11)
RDW: 15.1 % (ref 11.5–15.5)
WBC: 0.7 10*3/uL — CL (ref 4.0–10.5)
nRBC: 0 % (ref 0.0–0.2)

## 2020-07-05 LAB — CBC WITH DIFFERENTIAL/PLATELET
HCT: 22.8 % — ABNORMAL LOW (ref 36.0–46.0)
Hemoglobin: 7.8 g/dL — ABNORMAL LOW (ref 12.0–15.0)
MCH: 29.9 pg (ref 26.0–34.0)
MCHC: 34.2 g/dL (ref 30.0–36.0)
MCV: 87.4 fL (ref 80.0–100.0)
Platelets: 9 10*3/uL — CL (ref 150–400)
RBC: 2.61 MIL/uL — ABNORMAL LOW (ref 3.87–5.11)
RDW: 15.2 % (ref 11.5–15.5)
WBC: 0.6 10*3/uL — CL (ref 4.0–10.5)
nRBC: 0 % (ref 0.0–0.2)

## 2020-07-05 LAB — BASIC METABOLIC PANEL
Anion gap: 7 (ref 5–15)
BUN: 67 mg/dL — ABNORMAL HIGH (ref 6–20)
CO2: 17 mmol/L — ABNORMAL LOW (ref 22–32)
Calcium: 7.3 mg/dL — ABNORMAL LOW (ref 8.9–10.3)
Chloride: 115 mmol/L — ABNORMAL HIGH (ref 98–111)
Creatinine, Ser: 2.79 mg/dL — ABNORMAL HIGH (ref 0.44–1.00)
GFR, Estimated: 19 mL/min — ABNORMAL LOW (ref >60)
Glucose, Bld: 113 mg/dL — ABNORMAL HIGH (ref 70–99)
Potassium: 3.3 mmol/L — ABNORMAL LOW (ref 3.5–5.1)
Sodium: 139 mmol/L (ref 135–145)

## 2020-07-05 LAB — BPAM PLATELET PHERESIS
Blood Product Expiration Date: 202204172359
ISSUE DATE / TIME: 202204141322
Unit Type and Rh: 5100

## 2020-07-05 LAB — PREPARE PLATELET PHERESIS: Unit division: 0

## 2020-07-05 LAB — PHOSPHORUS: Phosphorus: 4.1 mg/dL (ref 2.5–4.6)

## 2020-07-05 LAB — MAGNESIUM: Magnesium: 1.6 mg/dL — ABNORMAL LOW (ref 1.7–2.4)

## 2020-07-05 MED ORDER — SODIUM CHLORIDE 0.9% IV SOLUTION: Freq: Once | INTRAVENOUS | Status: DC

## 2020-07-05 MED ORDER — POTASSIUM CHLORIDE CRYS ER 20 MEQ PO TBCR
40.0000 meq | EXTENDED_RELEASE_TABLET | Freq: Once | ORAL | Status: AC
  Administered 2020-07-05: 40 meq via ORAL
  Filled 2020-07-05: qty 2

## 2020-07-05 MED ORDER — SODIUM CHLORIDE 0.9% IV SOLUTION: Freq: Once | INTRAVENOUS | Status: AC

## 2020-07-05 MED ORDER — MAGNESIUM SULFATE IN D5W 1-5 GM/100ML-% IV SOLN
1.0000 g | Freq: Once | INTRAVENOUS | Status: AC
  Administered 2020-07-05: 1 g via INTRAVENOUS
  Filled 2020-07-05: qty 100

## 2020-07-05 MED ORDER — ONDANSETRON HCL 4 MG/2ML IJ SOLN
INTRAMUSCULAR | Status: AC
  Administered 2020-07-05: 4 mg via INTRAVENOUS
  Filled 2020-07-05: qty 2

## 2020-07-05 MED ORDER — ONDANSETRON HCL 4 MG/2ML IJ SOLN
4.0000 mg | Freq: Three times a day (TID) | INTRAMUSCULAR | Status: DC | PRN
  Administered 2020-07-09 – 2020-08-01 (×6): 4 mg via INTRAVENOUS
  Filled 2020-07-05 (×6): qty 2

## 2020-07-05 MED ORDER — ONDANSETRON HCL 4 MG/2ML IJ SOLN: 4.0000 mg | Freq: Once | INTRAMUSCULAR | Status: AC

## 2020-07-05 NOTE — Progress Notes (Signed)
Brenda [name] 11:51 AM  Subjective: Patient seen and examined and case discussed with my partner Dr. Michail Sermon and she has much less bowel movements and says her bleeding is much better and is tolerating clear liquids and has no new complaints  Objective: Vital signs stable afebrile no acute distress abdomen is soft nontender pancytopenia continues BUN and creatinine slowly improving x-rays reviewed  Assessment: GI blood loss in patient with pancytopenia  Plan: Continue present management call me if signs of acute bleeding otherwise we will check on tomorrow  [facility]  office [phone] After 5PM or if no answer call [phone]

## 2020-07-05 NOTE — Progress Notes (Signed)
PT Cancellation Note  Patient Details Name: Brenda [name] MRN: [id number] DOB: [birth year]   Cancelled Treatment:    Reason Eval/Treat Not Completed: Fatigue/lethargy limiting ability to participate , will check back later.  Claretha Cooper 07/05/2020, 9:10 AM  [place] Pager [phone] Office [phone]

## 2020-07-05 NOTE — Progress Notes (Signed)
PROGRESS NOTE    Arlis Everly  TDV:[id number] DOB: 27-Sep-1960 DOA: 07/01/2020 PCP: Pcp, No   Brief Narrative:  Brenda [name] is a 60 years old female with past medical history of stage IV non-small cell lung cancer- adenocarcinoma of right lung, depression undergoing chemotherapy with Dr. Julien Nordmann presented to the hospital with epistaxis, dark stools and generalized weakness for few days.  On initial presentation, blood pressure was stable but patient was mildly hypothermicCXR showed Port-A-Cath in place and minimal right pleural effusion but otherwise clear. Her lactic acid was >11, with INR 1.4 and had AKI with  creatinine over 5 without hyperkalemia and metabolic acidosis. Patient received PRBC in the ED and was more somnolent and had focal seizure-like activity.  She was given Ativan and Keppra and head CT was done.  Bear hugger was initiated and the patient was admitted to the hospital.  Hemoglobin was around 2.4 subsequently and was given packed RBC.  Of note patient was initiated on chemotherapy on 09/28/2019 for 11 cycles and current regimen of with carboplatin for an AUC of 5, Alimta 500 mg/m2, and Keytruda 200 mg IV every 3 weeks. Alimta was held due to renal insufficiency and neuropenia.   During hospitalization, patient was seen by GI and oncology.  Patient has been having pancytopenia with ongoing bleeding and conservative management has been pursued at this time   Assessment & Plan:   Principal Problem:   Bacteremia associated with intravascular line ([place]) Active Problems:   Malignant neoplasm of right lung (HCC)   Hypokalemia   Acute on chronic anemia   Gastrointestinal hemorrhage   Pancytopenia (HCC)   Renal failure   AKI (acute kidney injury) ([place])   Acute blood loss anemia   Pancytopenia:  Likely secondary to pemetrexed toxicity.   Status post multiple units of packed RBC and platelet transfusion. On Granix as per oncology.  Oncology recommends Granix daily until  [facility] is above 1.5.  Will continue to transfuse for hemoglobin less than 8 and platelet count less than 20,000 or active bleeding.. Plt 9 4/15  Acute renal failure:  Likely secondary to chemotherapy and severe anemia with possible volume depletion.  Received several liters of IV fluid during hospitalization..  Renal ultrasound was within normal limits.  Currently on maintenance fluids with Ringer lactate at 125 mL/h.  Continue to monitor.  Creatinine is trending down.  If no improvement might need nephrology evaluation.  Acute GI bleed.  Patient had RBC scan showing mild acute GI bleeding in the small bowel.  Unlikely to yield much from angiogram/ embolization as per GI.Marland Kitchen  Likely mucosal bleeding and GI recommended platelet and RBC transfusion.  Seen by GI during hospitalization.  Continue Protonix twice daily.  Transfuse as necessary.  Stage IV lung cancer: Adenocarcinoma by biopsy.  Status post 2 cycles of pemetrexed and pembrolizumabm oncology on board.  Transfuse if hemoglobin less than 8 as per oncology.  Seizure:  Unclear of etiology.  Possibility of hypoxia.  CT scan without any acute findings.    Lactic acidosis:  Chest x-ray was clear.  UA unremarkable.  Resolved improved tissue perfusion.  No source of infection.  Latest lactate of 1.0.  Significant hypokalemia.  Replenished IV KCl.  Potassium of 3.3 today. Repleting, Check levels in am.   Hypomagnesemia: replete and recheck in AM  Coagulase-negative staph bacteremia.  Only 1 set of blood culture was obtained.  Both aerobic and anaerobic bottles were positive.  Received vancomycin.  Check levels and dose according to  ID    DVT prophylaxis: SCD/Compression stockings  Code Status: FULL    Code Status Orders  (From admission, onward)         Start     Ordered   07/01/20 1433  Full code  Continuous        07/01/20 1433        Code Status History    Date Active Date Inactive Code Status Order ID Comments User  Context   04/07/2020 2042 04/11/2020 1909 Full Code [id number]  Rhetta Mura, DO ED   04/03/2019 1507 04/05/2019 2210 Full Code [id number]  Lyndee Hensen, DO ED   05/30/2017 1558 06/03/2017 0006 Full Code [id number]  Patrecia Pour, NP Inpatient   05/29/2017 1508 05/30/2017 1525 Full Code [id number]  Isla Pence, MD ED   12/24/2011 0140 12/25/2011 2040 Full Code [id number]  Truddie Hidden., MD ED   Advance Care Planning Activity     Family Communication: tried calling mr haskins, discussed patient's diagnosis and plan of care. Status is: Inpatient  Remains inpatient appropriate because:IV treatments appropriate due to intensity of illness or inability to take PO and Inpatient level of care appropriate due to severity of illness ongoing GI bleed, severe pancytopenia, concern for bacteremia on IV antibiotics  Dispo: The patient is from: Home  Anticipated d/c is to: Home likely, will get PT evaluation.  Continue to monitor closely.  Will need to follow GI oncology recommendations.  Patient currently is not medically stable to d/c.              Difficult to place patient No  Consultants:  PCCM  Infectious disease  Medical oncology  GI    Procedures:  CT Head Wo Contrast  Result Date: 07/01/2020 CLINICAL DATA:  Recent seizure activity EXAM: CT HEAD WITHOUT CONTRAST TECHNIQUE: Contiguous axial images were obtained from the base of the skull through the vertex without intravenous contrast. COMPARISON:  04/07/2020 FINDINGS: Brain: Mild atrophic changes are noted. No findings to suggest acute hemorrhage, acute infarction or space-occupying mass lesion noted. Vascular: No hyperdense vessel or unexpected calcification. Skull: Normal. Negative for fracture or focal lesion. Sinuses/Orbits: No acute finding. Chronic soft tissue changes are noted in the left ethmoid sinuses. Other: None. IMPRESSION: Mild atrophic changes without acute abnormality. Electronically Signed    By: Inez Catalina M.D.   On: 07/01/2020 15:05   NM GI Blood Loss  Result Date: 07/02/2020 CLINICAL DATA:  Acute lower GI bleeding, 3 episodes of large volume hematochezia. History of lung cancer EXAM: NUCLEAR MEDICINE GASTROINTESTINAL BLEEDING SCAN TECHNIQUE: Sequential abdominal images were obtained following intravenous administration of Tc-46m labeled red blood cells. RADIOPHARMACEUTICALS:  22.7 mCi Tc-31m pertechnetate in-vitro labeled red cells. COMPARISON:  05/21/2020 CT with contrast FINDINGS: Abnormal activity within the mid and lower abdomen extending into the upper pelvis and peristalsing throughout loops of bowel compatible with mild acute GI bleeding. Based on the pattern of peristalsis, it is difficult to determine location but small bowel is favored over colon. IMPRESSION: Positive exam for mild acute GI bleeding as above. These results will be called to the ordering clinician or representative by the Radiologist Assistant, and communication documented in the PACS or [employer]. Electronically Signed   By: Jerilynn Mages.  Shick M.D.   On: 07/02/2020 12:08   US RENAL  Result Date: 07/01/2020 CLINICAL DATA:  Acute kidney injury. EXAM: RENAL / URINARY TRACT ULTRASOUND COMPLETE COMPARISON:  CT abdomen pelvis dated May 21, 2020. FINDINGS: Right Kidney: Renal measurements: 10.0 x  4.2 x 5.0 cm = volume: 109 mL. Echogenicity within normal limits. No mass or hydronephrosis visualized. Left Kidney: Renal measurements: 9.1 x 5.1 x 5.3 cm = volume: 128 mL. Echogenicity within normal limits. No mass or hydronephrosis visualized. Other: Bilateral pleural effusions. IMPRESSION: 1. Normal renal ultrasound. 2. Bilateral pleural effusions. Electronically Signed   By: Titus Dubin M.D.   On: 07/01/2020 15:55   DG Chest Port 1 View  Result Date: 07/01/2020 CLINICAL DATA:  Shortness of breath EXAM: PORTABLE CHEST 1 VIEW COMPARISON:  April 04, 2019 FINDINGS: Port-A-Cath tip is in the superior vena cava. No  pneumothorax. There is a rather minimal right pleural effusion. No edema or airspace opacity. Heart size and pulmonary vascularity are normal. No adenopathy. No bone lesions. IMPRESSION: Port-A-Cath as noted. Other minimal right pleural effusion. Lungs otherwise clear. Cardiac silhouette normal. Electronically Signed   By: Lowella Grip III M.D.   On: 07/01/2020 11:44       Subjective: Patient reports feeling tired otherwise no acute events overnight  Objective: Vitals:   07/05/20 0900 07/05/20 0902 07/05/20 1000 07/05/20 1100  BP: (!) 155/102 (!) 145/73 134/72 (!) 122/58  Pulse:      Resp: (!) 38 20 (!) 22 (!) 25  Temp:      TempSrc:      SpO2: 99% 97% 99%   Weight:      Height:        Intake/Output Summary (Last 24 hours) at 07/05/2020 1128 Last data filed at 07/05/2020 1100 Gross per 24 hour  Intake 4414.59 ml  Output 1680 ml  Net 2734.59 ml   Filed Weights   07/03/20 0454 07/04/20 0500 07/05/20 0500  Weight: 60 kg 60 kg 61 kg    Examination:  General exam: Appears calm and comfortable tired appearing Respiratory system: Clear to auscultation. Respiratory effort normal. Cardiovascular system: S1 & S2 heard, RRR. No JVD, murmurs, rubs, gallops or clicks. No pedal edema. Gastrointestinal system: Abdomen is nondistended, soft and nontender. No organomegaly or masses felt. Normal bowel sounds heard. Central nervous system: Alert and oriented. No focal neurological deficits. Extremities: Symmetric 5 x 5 power.  No contractures warm well perfused Skin: No rashes, lesions or ulcers Psychiatry: Judgement and insight appear normal. Mood & affect appropriate.  Although somewhat flat    Data Reviewed: I have personally reviewed following labs and imaging studies  CBC: Recent Labs  Lab 07/01/20 1459 07/02/20 0308 07/02/20 1428 07/02/20 1800 07/02/20 2100 07/03/20 0621 07/03/20 1630 07/04/20 0000 07/04/20 0538 07/04/20 1700 07/05/20 0620  WBC 0.7*   < > 0.4*  0.3* 0.4*   < > 0.4* 0.5* 0.6* 0.6* 0.6*  NEUTROABS 0.1*  --  0.0* 0.0* 0.0*  --   --  0.1*  --   --   --   HGB 2.4*   < > 7.3* 6.0* 4.8*   < > 7.7* 8.9* 8.8* 8.7* 7.8*  HCT 7.9*   < > 20.2* 16.8* 13.7*   < > 22.4* 25.7* 25.1* 24.9* 22.8*  MCV 125.4*   < > 89.8 90.8 91.9   < > 87.5 87.7 86.3 86.5 87.4  PLT <5*   < > 37* 82* 68*   < > 22* 38* 25* 30* 9*   < > = values in this interval not displayed.   Basic Metabolic Panel: Recent Labs  Lab 07/01/20 1320 07/02/20 0308 07/02/20 0930 07/03/20 0621 07/04/20 0538 07/05/20 0620  NA 133* 133* 136 137 139 139  K 3.9  3.1* 2.9* 2.7* 3.8 3.3*  CL 104 105 110 111 115* 115*  CO2 <7* 14* 13* 15* 16* 17*  GLUCOSE 190* 105* 99 101* 93 113*  BUN 91* 94* 97* 89* 83* 67*  CREATININE 5.56* 4.59* 4.96* 3.97* 3.27* 2.79*  CALCIUM 6.7* 7.0* 6.6* 6.8* 7.1* 7.3*  MG 2.1 1.8  --  1.8 1.8 1.6*  PHOS  --  8.9*  --   --  4.9* 4.1   GFR: Estimated Creatinine Clearance: 20.8 mL/min (A) (by C-G formula based on SCr of 2.79 mg/dL (H)). Liver Function Tests: Recent Labs  Lab 07/01/20 1155 07/01/20 1320 07/04/20 0538  AST QUESTIONABLE RESULTS, RECOMMEND RECOLLECT TO VERIFY 68* 39  ALT QUESTIONABLE RESULTS, RECOMMEND RECOLLECT TO VERIFY 32 28  ALKPHOS QUESTIONABLE RESULTS, RECOMMEND RECOLLECT TO VERIFY 62 53  BILITOT QUESTIONABLE RESULTS, RECOMMEND RECOLLECT TO VERIFY 0.5 0.7  PROT QUESTIONABLE RESULTS, RECOMMEND RECOLLECT TO VERIFY 4.4* 4.1*  ALBUMIN QUESTIONABLE RESULTS, RECOMMEND RECOLLECT TO VERIFY 1.4* 1.4*   Recent Labs  Lab 07/01/20 1155 07/01/20 1320  LIPASE QUESTIONABLE RESULTS, RECOMMEND RECOLLECT TO VERIFY 55*   No results for input(s): AMMONIA in the last 168 hours. Coagulation Profile: Recent Labs  Lab 07/01/20 1156 07/02/20 0452  INR 1.4* 1.3*   Cardiac Enzymes: No results for input(s): CKTOTAL, CKMB, CKMBINDEX, TROPONINI in the last 168 hours. BNP (last 3 results) No results for input(s): PROBNP in the last 8760  hours. HbA1C: No results for input(s): HGBA1C in the last 72 hours. CBG: Recent Labs  Lab 07/04/20 1533 07/04/20 1935 07/05/20 0018 07/05/20 0347 07/05/20 0737  GLUCAP 87 95 84 95 93   Lipid Profile: No results for input(s): CHOL, HDL, LDLCALC, TRIG, CHOLHDL, LDLDIRECT in the last 72 hours. Thyroid Function Tests: No results for input(s): TSH, T4TOTAL, FREET4, T3FREE, THYROIDAB in the last 72 hours. Anemia Panel: No results for input(s): VITAMINB12, FOLATE, FERRITIN, TIBC, IRON, RETICCTPCT in the last 72 hours. Sepsis Labs: Recent Labs  Lab 07/01/20 1151 07/01/20 1416 07/02/20 0308  LATICACIDVEN >11.0* >11.0* 1.0    Recent Results (from the past 240 hour(s))  Blood culture (routine single)     Status: Abnormal (Preliminary result)   Collection Time: 07/01/20 11:53 AM   Specimen: BLOOD  Result Value Ref Range Status   Specimen Description   Final    BLOOD LEFT ANTECUBITAL Performed at [facility] [address], [place], [place] [address]    Special Requests   Final    BOTTLES DRAWN AEROBIC AND ANAEROBIC Blood Culture adequate volume Performed at [facility] [address], [place], [place] [address]    Culture  Setup Time   Final    GRAM POSITIVE COCCI IN CLUSTERS IN BOTH AEROBIC AND ANAEROBIC BOTTLES CRITICAL RESULT CALLED TO, READ BACK BY AND VERIFIED WITH: PHARMD JUSTIN LOGAN AT [place] ON 07/02/20    Culture (A)  Final    STAPHYLOCOCCUS HOMINIS SUSCEPTIBILITIES TO FOLLOW Performed at [facility], [place] [address] [place], [place] [address]    Report Status PENDING  Incomplete  Blood Culture ID Panel (Reflexed)     Status: Abnormal   Collection Time: 07/01/20 11:53 AM  Result Value Ref Range Status   Enterococcus faecalis NOT DETECTED NOT DETECTED Final   Enterococcus Faecium NOT DETECTED NOT DETECTED Final   Listeria monocytogenes NOT DETECTED NOT DETECTED Final   Staphylococcus species DETECTED (A) NOT  DETECTED Final    Comment: CRITICAL RESULT CALLED TO, READ BACK BY  AND VERIFIED WITH: PHARMD JUSTIN LEGGE BY [facility] AT 5852 ON [id number]    Staphylococcus aureus (BCID) NOT DETECTED NOT DETECTED Final   Staphylococcus epidermidis NOT DETECTED NOT DETECTED Final   Staphylococcus lugdunensis NOT DETECTED NOT DETECTED Final   Streptococcus species NOT DETECTED NOT DETECTED Final   Streptococcus agalactiae NOT DETECTED NOT DETECTED Final   Streptococcus pneumoniae NOT DETECTED NOT DETECTED Final   Streptococcus pyogenes NOT DETECTED NOT DETECTED Final   A.calcoaceticus-baumannii NOT DETECTED NOT DETECTED Final   Bacteroides fragilis NOT DETECTED NOT DETECTED Final   Enterobacterales NOT DETECTED NOT DETECTED Final   Enterobacter cloacae complex NOT DETECTED NOT DETECTED Final   Escherichia coli NOT DETECTED NOT DETECTED Final   Klebsiella aerogenes NOT DETECTED NOT DETECTED Final   Klebsiella oxytoca NOT DETECTED NOT DETECTED Final   Klebsiella pneumoniae NOT DETECTED NOT DETECTED Final   Proteus species NOT DETECTED NOT DETECTED Final   Salmonella species NOT DETECTED NOT DETECTED Final   Serratia marcescens NOT DETECTED NOT DETECTED Final   Haemophilus influenzae NOT DETECTED NOT DETECTED Final   Neisseria meningitidis NOT DETECTED NOT DETECTED Final   Pseudomonas aeruginosa NOT DETECTED NOT DETECTED Final   Stenotrophomonas maltophilia NOT DETECTED NOT DETECTED Final   Candida albicans NOT DETECTED NOT DETECTED Final   Candida auris NOT DETECTED NOT DETECTED Final   Candida glabrata NOT DETECTED NOT DETECTED Final   Candida krusei NOT DETECTED NOT DETECTED Final   Candida parapsilosis NOT DETECTED NOT DETECTED Final   Candida tropicalis NOT DETECTED NOT DETECTED Final   Cryptococcus neoformans/gattii NOT DETECTED NOT DETECTED Final    Comment: Performed at [facility], [address] [place], [place] [address]  Resp Panel by RT-PCR (Flu A&B, Covid) Nasopharyngeal Swab     Status:  None   Collection Time: 07/01/20  2:05 PM   Specimen: Nasopharyngeal Swab; Nasopharyngeal(NP) swabs in vial transport medium  Result Value Ref Range Status   SARS Coronavirus 2 by RT PCR NEGATIVE NEGATIVE Final    Comment: (NOTE) SARS-CoV-2 target nucleic acids are NOT DETECTED.  The SARS-CoV-2 RNA is generally detectable in upper respiratory specimens during the acute phase of infection. The lowest concentration of SARS-CoV-2 viral copies this assay can detect is 138 copies/mL. A negative result does not preclude SARS-Cov-2 infection and should not be used as the sole basis for treatment or other patient management decisions. A negative result may occur with  improper specimen collection/handling, submission of specimen other than nasopharyngeal swab, presence of viral mutation(s) within the areas targeted by this assay, and inadequate number of viral copies(<138 copies/mL). A negative result must be combined with clinical observations, patient history, and epidemiological information. The expected result is Negative.  Fact Sheet for Patients:  EntrepreneurPulse.com.au  Fact Sheet for Healthcare Providers:  IncredibleEmployment.be  This test is no t yet approved or cleared by the [place] FDA and  has been authorized for detection and/or diagnosis of SARS-CoV-2 by FDA under an Emergency Use Authorization (EUA). This EUA will remain  in effect (meaning this test can be used) for the duration of the COVID-19 declaration under Section 564(b)(1) of the Act, 21 U.S.C.section 360bbb-3(b)(1), unless the authorization is terminated  or revoked sooner.       Influenza A by PCR NEGATIVE NEGATIVE Final   Influenza B by PCR NEGATIVE NEGATIVE Final    Comment: (NOTE) The Xpert Xpress SARS-CoV-2/FLU/RSV plus assay is intended as an aid in the diagnosis of influenza from Nasopharyngeal swab specimens and  should not be used as a sole basis for  treatment. Nasal washings and aspirates are unacceptable for Xpert Xpress SARS-CoV-2/FLU/RSV testing.  Fact Sheet for Patients: EntrepreneurPulse.com.au  Fact Sheet for Healthcare Providers: IncredibleEmployment.be  This test is not yet approved or cleared by the [place] FDA and has been authorized for detection and/or diagnosis of SARS-CoV-2 by FDA under an Emergency Use Authorization (EUA). This EUA will remain in effect (meaning this test can be used) for the duration of the COVID-19 declaration under Section 564(b)(1) of the Act, 21 U.S.C. section 360bbb-3(b)(1), unless the authorization is terminated or revoked.  Performed at [facility], [place] [address], [place], [place] [address]   Urine culture     Status: None   Collection Time: 07/01/20  2:16 PM   Specimen: In/Out Cath Urine  Result Value Ref Range Status   Specimen Description   Final    IN/OUT CATH URINE Performed at [facility] [address] [place], [place] [address]    Special Requests   Final    NONE Performed at [facility], [place] [address], [place], [place] [address]    Culture   Final    NO GROWTH Performed at [facility], [place] [address] [place], [place] [address]    Report Status 07/04/2020 FINAL  Final  MRSA PCR Screening     Status: Abnormal   Collection Time: 07/01/20  4:57 PM   Specimen: Nasopharyngeal  Result Value Ref Range Status   MRSA by PCR POSITIVE (A) NEGATIVE Final    Comment:        The GeneXpert MRSA Assay (FDA approved for NASAL specimens only), is one component of a comprehensive MRSA colonization surveillance program. It is not intended to diagnose MRSA infection nor to guide or monitor treatment for MRSA infections. RESULT CALLED TO, READ BACK BY AND VERIFIED WITH: BARKSDALE,T. RN @1932  ON 04.11.2022 BY COHEN,K Performed at [facility], [place]  [address], [place], [place] [address]          Radiology Studies: No results found.      Scheduled Meds: . sodium chloride   Intravenous Once  . Chlorhexidine Gluconate Cloth  6 each Topical Daily  . insulin aspart  0-9 Units Subcutaneous Q4H  . mupirocin ointment  1 application Nasal BID  . pantoprazole (PROTONIX) IV  40 mg Intravenous Q12H  . Tbo-filgastrim (GRANIX) SQ  300 mcg Subcutaneous q1800   Continuous Infusions: . lactated ringers 125 mL/hr at 07/05/20 1015     LOS: 4 days    Time spent: 65 min    Nicolette Bang, MD [facility]  If 7PM-7AM, please contact night-coverage  07/05/2020, 11:28 AM

## 2020-07-05 NOTE — Progress Notes (Signed)
[facility] for Infectious Disease  Date of Admission:  07/01/2020           Reason for visit: Follow up on coag negative staph bacteremia  Current antibiotics: Vancomycin (4/11--present)  Previous antibiotics: Cefepime x1 dose 4/11   ASSESSMENT:    1. Coag negative staph bacteremia: Only 1 set of cultures obtained at admission, however, aerobic and anaerobic bottles were positive.  Awaiting susceptibilities as of this AM 07/05/20.  She has an indwelling line making this difficult to determine whether this is a true pathogen or contaminant.  Phlebotomy was unable to obtain further sets of blood cultures due to difficult venous access further confounding the issue.  Overall, suspicious that this might have been a contaminant given her presentation appears to be driven primarily by severe pancytopenia due to chemotherapy toxicity but ultimately this is difficult to tell and favor giving her course of antibiotics in the setting of immunosuppression, retained chemotherapy port and clinical uncertainty 2. Acute kidney injury: Creatinine improving 3. Pancytopenia 4. Stage IV lung cancer 5. GI bleed: Secondary to diffuse mucosal bleeding  PLAN:    . Continue vancomycin dosed by level . Follow-up susceptibilities . Will follow . Dr Sheran Fava available as needed over the weekend   Principal Problem:   Bacteremia associated with intravascular line ([place]) Active Problems:   Malignant neoplasm of right lung ([place])   Hypokalemia   Acute on chronic anemia   Gastrointestinal hemorrhage   Pancytopenia (HCC)   Renal failure   AKI (acute kidney injury) ([place])   Acute blood loss anemia    MEDICATIONS:    Scheduled Meds: . sodium chloride   Intravenous Once  . Chlorhexidine Gluconate Cloth  6 each Topical Daily  . insulin aspart  0-9 Units Subcutaneous Q4H  . mupirocin ointment  1 application Nasal BID  . pantoprazole (PROTONIX) IV  40 mg Intravenous Q12H  . Tbo-filgastrim  (GRANIX) SQ  300 mcg Subcutaneous q1800   Continuous Infusions: . lactated ringers 125 mL/hr at 07/05/20 0800   PRN Meds:.docusate sodium, Gerhardt's butt cream  SUBJECTIVE:   24 hour events:  No acute events noted overnighy   No new complaint Using bed pan Denies fever, chills, n/v/d Tolerating abx     OBJECTIVE:   Blood pressure 134/74, pulse 89, temperature 98.2 F (36.8 C), temperature source Oral, resp. rate (!) 23, height 5\' 6"  (1.676 m), weight 61 kg, last menstrual period 01/19/2011, SpO2 98 %. Body mass index is 21.71 kg/m.  Physical Exam Constitutional:      General: She is not in acute distress.    Appearance: Normal appearance.     Comments: Currently using the bed pan  HENT:     Head: Normocephalic and atraumatic.  Eyes:     Extraocular Movements: Extraocular movements intact.     Conjunctiva/sclera: Conjunctivae normal.  Cardiovascular:     Comments: Right-sided chemotherapy port without erythema or tenderness Pulmonary:     Effort: Pulmonary effort is normal. No respiratory distress.  Neurological:     General: No focal deficit present.     Mental Status: She is alert and oriented to person, place, and time.  Psychiatric:        Mood and Affect: Mood normal.        Behavior: Behavior normal.      Lab Results: Lab Results  Component Value Date   WBC 0.6 (LL) 07/05/2020   HGB 7.8 (L) 07/05/2020   HCT 22.8 (L) 07/05/2020  MCV 87.4 07/05/2020   PLT 9 (LL) 07/05/2020    Lab Results  Component Value Date   NA 139 07/05/2020   K 3.3 (L) 07/05/2020   CO2 17 (L) 07/05/2020   GLUCOSE 113 (H) 07/05/2020   BUN 67 (H) 07/05/2020   CREATININE 2.79 (H) 07/05/2020   CALCIUM 7.3 (L) 07/05/2020   GFRNONAA 19 (L) 07/05/2020   GFRAA  07/01/2020    QUESTIONABLE RESULTS, RECOMMEND RECOLLECT TO VERIFY    Lab Results  Component Value Date   ALT 28 07/04/2020   AST 39 07/04/2020   ALKPHOS 53 07/04/2020   BILITOT 0.7 07/04/2020    No results  found for: CRP  No results found for: ESRSEDRATE   I have reviewed the micro and lab results in Epic.  Imaging: No results found.   Imaging  independently reviewed in Epic.    Raynelle Highland for Infectious Disease [facility] [phone] pager 07/05/2020, 8:56 AM

## 2020-07-06 DIAGNOSIS — T827XXD Infection and inflammatory reaction due to other cardiac and vascular devices, implants and grafts, subsequent encounter: Secondary | ICD-10-CM | POA: Diagnosis not present

## 2020-07-06 DIAGNOSIS — D62 Acute posthemorrhagic anemia: Secondary | ICD-10-CM | POA: Diagnosis not present

## 2020-07-06 DIAGNOSIS — D649 Anemia, unspecified: Secondary | ICD-10-CM | POA: Diagnosis not present

## 2020-07-06 DIAGNOSIS — N179 Acute kidney failure, unspecified: Secondary | ICD-10-CM | POA: Diagnosis not present

## 2020-07-06 LAB — CBC WITH DIFFERENTIAL/PLATELET
Abs Immature Granulocytes: 0 10*3/uL (ref 0.00–0.07)
Basophils Absolute: 0 10*3/uL (ref 0.0–0.1)
Basophils Relative: 0 %
Eosinophils Absolute: 0 10*3/uL (ref 0.0–0.5)
Eosinophils Relative: 0 %
HCT: 23.3 % — ABNORMAL LOW (ref 36.0–46.0)
Hemoglobin: 8 g/dL — ABNORMAL LOW (ref 12.0–15.0)
Lymphocytes Relative: 64 %
Lymphs Abs: 0.6 10*3/uL — ABNORMAL LOW (ref 0.7–4.0)
MCH: 30.7 pg (ref 26.0–34.0)
MCHC: 34.3 g/dL (ref 30.0–36.0)
MCV: 89.3 fL (ref 80.0–100.0)
Monocytes Absolute: 0 10*3/uL — ABNORMAL LOW (ref 0.1–1.0)
Monocytes Relative: 0 %
Neutro Abs: 0.3 10*3/uL — CL (ref 1.7–7.7)
Neutrophils Relative %: 36 %
Platelets: 24 10*3/uL — CL (ref 150–400)
RBC: 2.61 MIL/uL — ABNORMAL LOW (ref 3.87–5.11)
RDW: 14.9 % (ref 11.5–15.5)
WBC: 0.9 10*3/uL — CL (ref 4.0–10.5)
nRBC: 0 % (ref 0.0–0.2)

## 2020-07-06 LAB — BPAM PLATELET PHERESIS
Blood Product Expiration Date: 202204172359
ISSUE DATE / TIME: 202204150822
Unit Type and Rh: 5100

## 2020-07-06 LAB — GLUCOSE, CAPILLARY
Glucose-Capillary: 103 mg/dL — ABNORMAL HIGH (ref 70–99)
Glucose-Capillary: 104 mg/dL — ABNORMAL HIGH (ref 70–99)
Glucose-Capillary: 111 mg/dL — ABNORMAL HIGH (ref 70–99)
Glucose-Capillary: 87 mg/dL (ref 70–99)
Glucose-Capillary: 91 mg/dL (ref 70–99)
Glucose-Capillary: 93 mg/dL (ref 70–99)
Glucose-Capillary: 93 mg/dL (ref 70–99)

## 2020-07-06 LAB — CULTURE, BLOOD (SINGLE): Special Requests: ADEQUATE

## 2020-07-06 LAB — VANCOMYCIN, RANDOM: Vancomycin Rm: 21

## 2020-07-06 LAB — BASIC METABOLIC PANEL
Anion gap: 8 (ref 5–15)
BUN: 48 mg/dL — ABNORMAL HIGH (ref 6–20)
CO2: 17 mmol/L — ABNORMAL LOW (ref 22–32)
Calcium: 7.4 mg/dL — ABNORMAL LOW (ref 8.9–10.3)
Chloride: 115 mmol/L — ABNORMAL HIGH (ref 98–111)
Creatinine, Ser: 2.25 mg/dL — ABNORMAL HIGH (ref 0.44–1.00)
GFR, Estimated: 25 mL/min — ABNORMAL LOW (ref >60)
Glucose, Bld: 98 mg/dL (ref 70–99)
Potassium: 3.6 mmol/L (ref 3.5–5.1)
Sodium: 140 mmol/L (ref 135–145)

## 2020-07-06 LAB — PREPARE PLATELET PHERESIS: Unit division: 0

## 2020-07-06 LAB — MAGNESIUM: Magnesium: 1.4 mg/dL — ABNORMAL LOW (ref 1.7–2.4)

## 2020-07-06 MED ORDER — ORAL CARE MOUTH RINSE
15.0000 mL | Freq: Two times a day (BID) | OROMUCOSAL | Status: DC
  Administered 2020-07-06 – 2020-08-02 (×51): 15 mL via OROMUCOSAL

## 2020-07-06 MED ORDER — SODIUM CHLORIDE 0.9% FLUSH
10.0000 mL | Freq: Two times a day (BID) | INTRAVENOUS | Status: DC
  Administered 2020-07-06: 10 mL
  Administered 2020-07-06 – 2020-07-07 (×2): 20 mL
  Administered 2020-07-07 – 2020-07-12 (×4): 10 mL

## 2020-07-06 MED ORDER — METOPROLOL TARTRATE 5 MG/5ML IV SOLN
5.0000 mg | INTRAVENOUS | Status: AC | PRN
  Administered 2020-07-07: 5 mg via INTRAVENOUS
  Filled 2020-07-06: qty 5

## 2020-07-06 MED ORDER — CEFAZOLIN SODIUM-DEXTROSE 1-4 GM/50ML-% IV SOLN
1.0000 g | Freq: Two times a day (BID) | INTRAVENOUS | Status: DC
  Administered 2020-07-06 – 2020-07-07 (×3): 1 g via INTRAVENOUS
  Filled 2020-07-06 (×5): qty 50

## 2020-07-06 MED ORDER — SODIUM CHLORIDE 0.9% FLUSH
10.0000 mL | INTRAVENOUS | Status: DC | PRN
Start: 2020-07-06 — End: 2020-07-13
  Administered 2020-07-09 (×2): 10 mL

## 2020-07-06 MED ORDER — METOPROLOL TARTRATE 5 MG/5ML IV SOLN
INTRAVENOUS | Status: AC
  Administered 2020-07-06: 5 mg via INTRAVENOUS
  Filled 2020-07-06: qty 5

## 2020-07-06 NOTE — Progress Notes (Addendum)
[name] 10:57 AM  Subjective: Patient doing well without signs of bleeding no new complaints and case discussed with the patient's nurse as well  Objective: Signs stable afebrile no acute distress in good spirits not examined today BUN and creatinine continue to improve hemoglobin stable white count increased platelet count decreased  Assessment: Pancytopenia  Plan: Please call me this weekend if I could be of any further assistance otherwise if no signs of further bleeding over the next day or 2 consider advancing her diet further  [facility] E  office [phone] After 5PM or if no answer call [phone]

## 2020-07-06 NOTE — Evaluation (Signed)
Physical Therapy Evaluation Patient Details Name: Brenda [name] MRN: [id number] DOB: 23-May-1960 Today's Date: 07/06/2020   History of Present Illness  60 years old female adm with Bacteremia associated with intravascular line.   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 Julien Nordmann presented to the hospital with epistaxis, dark stools and generalized weakness for few days.  CXR showed Port-A-Cath in place and minimal right pleural effusion but otherwise clear.  Clinical Impression  Pt admitted with above diagnosis.  PT agreeable to OOB, having lots of abd pain/cramping limiting overall mobility.  Pt mostly min assist for bed mobility and transfers,  complained of some mild dizziness however VSS--BP 137/68, SpO2=95% on RA. Continue to follow in acute setting. Pt currently with functional limitations due to the deficits listed below (see PT Problem List). Pt will benefit from skilled PT to increase their independence and safety with mobility to allow discharge to the venue listed below.       Follow Up Recommendations Home health PT;No PT follow up (pending progress and acute LOS)    Equipment Recommendations  Other (comment) (TBD)    Recommendations for Other Services       Precautions / Restrictions Precautions Precautions: Fall Restrictions Weight Bearing Restrictions: No      Mobility  Bed Mobility Overal bed mobility: Needs Assistance Bed Mobility: Supine to Sit     Supine to sit: Min assist     General bed mobility comments: light assist to elevate trunk, incr time needed    Transfers Overall transfer level: Needs assistance Equipment used: 1 person hand held assist Transfers: Sit to/from [facility] Sit to Stand: Min assist;+2 safety/equipment Stand pivot transfers: Min assist;+2 safety/equipment       General transfer comment: assist to rise and steady.  stand pivot to [facility], BSC to chair.  Ambulation/Gait             General Gait Details: NT d/t abd pain  Stairs            Wheelchair Mobility    Modified Rankin (Stroke Patients Only)       Balance Overall balance assessment: Needs assistance Sitting-balance support: No upper extremity supported;Feet supported Sitting balance-Leahy Scale: Fair       Standing balance-Leahy Scale: Poor Standing balance comment: reliant on unilateral UE support                             Pertinent Vitals/Pain Pain Assessment: Faces Faces Pain Scale: Hurts little more Pain Location: abd Pain Descriptors / Indicators: Cramping Pain Intervention(s): Limited activity within patient's tolerance;Monitored during session;Repositioned    Home Living Family/patient expects to be discharged to:: Private residence Living Arrangements: Spouse/significant other Available Help at Discharge: Friend(s) Type of Home: Apartment Home Access: Stairs to enter Entrance Stairs-Rails: Right Entrance Stairs-Number of Steps: 14-16 Home Layout: One level Home Equipment: None Additional Comments: pt reports she lives in an apt, previous notes state pt has been living in [place]. pt reports today that her "old man" will be with her, she states he helps her up an down the steps    Prior Function Level of Independence: Independent               Hand Dominance        Extremity/Trunk Assessment   Upper Extremity Assessment Upper Extremity Assessment: Defer to OT evaluation    Lower  Extremity Assessment Lower Extremity Assessment: Generalized weakness       Communication   Communication: No difficulties  Cognition Arousal/Alertness: Awake/alert Behavior During Therapy: WFL for tasks assessed/performed Overall Cognitive Status: Within Functional Limits for tasks assessed                                        General Comments      Exercises      Assessment/Plan    PT Assessment Patient needs continued PT services  PT Problem List Decreased strength;Decreased range of motion;Decreased activity tolerance;Decreased mobility;Decreased knowledge of use of DME;Decreased balance;Pain       PT Treatment Interventions DME instruction;Therapeutic activities;Gait training;Functional mobility training;Therapeutic exercise;Patient/family education;Balance training;Stair training    PT Goals (Current goals can be found in the Care Plan section)  Acute Rehab PT Goals Patient Stated Goal: to go home! PT Goal Formulation: With patient Time For Goal Achievement: 07/20/20 Potential to Achieve Goals: Good    Frequency Min 3X/week   Barriers to discharge        Co-evaluation               AM-PAC PT "6 Clicks" Mobility  Outcome Measure Help needed turning from your back to your side while in a flat bed without using bedrails?: A Little Help needed moving from lying on your back to sitting on the side of a flat bed without using bedrails?: A Little Help needed moving to and from a bed to a chair (including a wheelchair)?: A Little Help needed standing up from a chair using your arms (e.g., wheelchair or bedside chair)?: A Little Help needed to walk in hospital room?: A Lot Help needed climbing 3-5 steps with a railing? : A Lot 6 Click Score: 16    End of Session   Activity Tolerance: Patient limited by pain Patient left: in chair;with call bell/phone within reach;with chair alarm set   PT Visit Diagnosis: Other abnormalities of gait and mobility (R26.89)    Time: 6701-4103 PT Time Calculation (min) (ACUTE ONLY): 29 min   Charges:   PT Evaluation $PT Eval Low Complexity: 1 Low PT Treatments $Therapeutic Activity: 8-22 mins        Baxter Flattery, PT  Acute Rehab Dept ([facility]) [phone] Pager [phone]  07/06/2020   [facility] 07/06/2020, 1:14 PM

## 2020-07-06 NOTE — Progress Notes (Addendum)
Pharmacy Antibiotic Note  Brenda [name] is a 60 y.o. female admitted on 07/01/2020 with possible CoNS bacteremia.  Pharmacy has been consulted for vancomycin dosing. Severe AKI noted on admission (SCr 4x baseline). Vancomycin 1g given in ED on 4/11 at 1300.  Today, 07/06/2020:  SCr elevated but continues to improve  Random vanc level 21, drawn 27 hr after 750 mg dose on 4/15  Staph hominis sensitivities back showing methicillin susceptibility  WBC/ANC low d/t chemo, but improving  Remains afebrile  Plan:  D/w MD, will narrow to Ancef 1g IV q12 hr  Pharmacy will sign off  Height: 5\' 6"  (167.6 cm) Weight: 61 kg (134 lb 7.7 oz) IBW/kg (Calculated) : 59.3  Temp (24hrs), Avg:98.3 F (36.8 C), Min:97.9 F (36.6 C), Max:99 F (37.2 C)  Recent Labs  Lab 07/01/20 1151 07/01/20 1155 07/01/20 1416 07/01/20 1459 07/02/20 0308 07/02/20 0930 07/02/20 1428 07/03/20 0621 07/03/20 1630 07/04/20 0538 07/04/20 1200 07/04/20 1700 07/05/20 0620 07/05/20 1615 07/06/20 0500 07/06/20 0858  WBC  --    < >  --    < > 0.7* 0.5*   < > 0.5*   < > 0.6*  --  0.6* 0.6* 0.7* 0.9*  --   CREATININE  --    < >  --   --  4.59* 4.96*  --  3.97*  --  3.27*  --   --  2.79*  --  2.25*  --   LATICACIDVEN >11.0*  --  >11.0*  --  1.0  --   --   --   --   --   --   --   --   --   --   --   VANCORANDOM  --   --   --   --   --   --    < > 12  --   --  19  --   --   --   --  21   < > = values in this interval not displayed.    Estimated Creatinine Clearance: 25.8 mL/min (A) (by C-G formula based on SCr of 2.25 mg/dL (H)).    Allergies  Allergen Reactions  . Aspirin Adult Low [Aspirin] Other (See Comments)    Stomach upset     Thank you for allowing pharmacy to be a part of this patient's care.  Adleigh Mcmasters A 07/06/2020 9:52 AM

## 2020-07-06 NOTE — Progress Notes (Signed)
PROGRESS NOTE    Brenda [name]  OHY:[id number] DOB: [birth year] DOA: 07/01/2020 PCP: Pcp, No   Brief Narrative:  Brenda [name] is a 60 years old female with past medical history of stage IV non-small cell lung cancer- adenocarcinoma of right lung, depression undergoing chemotherapy with Dr. Julien Nordmann presented tothehospital with epistaxis,dark stools and generalized weakness for few days. On initial presentation,blood pressure was stable but patientwas mildly hypothermicCXRshowedPort-A-Cath in place and minimal right pleural effusion but otherwise clear. Her lactic acid was >11, with INR 1.4 andhad AKI withcreatinine over 5 without hyperkalemia and metabolic acidosis. Patient received PRBC in the ED and was more somnolent and had focal seizure-like activity. She was given Ativan and Keppra andhead CT was done. Bear hugger was initiated and the patient was admitted to the hospital. Hemoglobin was around 2.4 subsequently and was given packed RBC. Of note patient was initiated on chemotherapy on 09/28/2019 for 11 cycles and current regimen of with carboplatin for an AUC of 5, Alimta 500 mg/m2, and Keytruda 200 mg IV every 3 weeks. Alimtawasheld due to renal insufficiency and neuropenia.  During hospitalization, patient was seen by GI and oncology. Patient has been having pancytopenia with ongoing bleeding and conservative management has been pursued at this time   Assessment & Plan:   Principal Problem:   Bacteremia associated with intravascular line ([place]) Active Problems:   Malignant neoplasm of right lung (HCC)   Hypokalemia   Acute on chronic anemia   Gastrointestinal hemorrhage   Pancytopenia (HCC)   Renal failure   AKI (acute kidney injury) ([place])   Acute blood loss anemia   Pancytopenia: Likely secondary to pemetrexed toxicity. Status post multiple units of packed RBC and platelet transfusion.On Granix as per oncology.Oncology recommends Granix daily until  [facility] is above 1.5. Will continue to transfuse for hemoglobin less than 8 and platelet count less than 20,000 or active bleeding.. Plt 9 4/15  Acute renal failure: Likely secondary tochemotherapy and severe anemia with possible volume depletion. Received several liters of IV fluidduring hospitalization.. Renal ultrasound was within normal limits. Currently on maintenancefluids with Ringer lactate at 125 mL/h. Continue to monitor. Creatinine is trending down. If no improvement might need nephrology evaluation.  Acute GI bleed. Patient had RBC scan showing mild acute GI bleeding in the small bowel. Unlikely to yield muchfrom angiogram/ embolization as per GI.Marland Kitchen Likely mucosal bleeding and GIrecommended platelet and RBC transfusion. Seen by GI during hospitalization. Continue Protonix twice daily.Transfuse as necessary.  Stage IV lung cancer:Adenocarcinomaby biopsy. Status post 2 cycles of pemetrexed and pembrolizumabmoncology on board. Transfuse if hemoglobin less than 8as per oncology.  Seizure:  Unclear of etiology. Possibility of hypoxia. CT scan without any acute findings.   Lactic acidosis: Chest x-ray was clear. UA unremarkable. Resolved improved tissue perfusion. No source of infection. Latest lactate of 1.0.  Significant hypokalemia.Replenished IV KCl. Potassium of 3.3 today. Repleting, Check levels in am.  Hypomagnesemia: replete and recheck in AM  Coagulase-negative staph bacteremia.Only 1 set of blood culture was obtained. Both aerobic and anaerobic bottles were positive. Received vancomycin. Check levels and dose according to ID  DVT prophylaxis: SCD/Compression stockings  Code Status: FULL    Code Status Orders  (From admission, onward)         Start     Ordered   07/01/20 1433  Full code  Continuous        07/01/20 1433        Code Status History    Date Active Date Inactive  Code Status Order ID Comments User Context    04/07/2020 2042 04/11/2020 1909 Full Code [id number]  Rhetta Mura, DO ED   04/03/2019 1507 04/05/2019 2210 Full Code [id number]  Lyndee Hensen, DO ED   05/30/2017 1558 06/03/2017 0006 Full Code [id number]  Patrecia Pour, NP Inpatient   05/29/2017 1508 05/30/2017 1525 Full Code [id number]  Isla Pence, MD ED   12/24/2011 0140 12/25/2011 2040 Full Code [id number]  Truddie Hidden., MD ED   Advance Care Planning Activity     Family Communication: NONE TODAY  Status is: Inpatient  Remains inpatient appropriate because:IV treatments appropriate due to intensity of illness or inability to take PO and Inpatient level of care appropriate due to severity of illnessongoing GI bleed, severe pancytopenia, concern for bacteremia on IV antibiotics  Dispo: The patient is from: Home Anticipated d/c is to: [place],will get PT evaluation.Continue to monitor closely.Will need to follow GI oncology recommendations. Patient currently is not medically stable to d/c. Difficult to place patient No  Consultants:  PCCM  Infectious disease  Medical oncology  GI  Procedures:  CT Head Wo Contrast  Result Date: 07/01/2020 CLINICAL DATA:  Recent seizure activity EXAM: CT HEAD WITHOUT CONTRAST TECHNIQUE: Contiguous axial images were obtained from the base of the skull through the vertex without intravenous contrast. COMPARISON:  04/07/2020 FINDINGS: Brain: Mild atrophic changes are noted. No findings to suggest acute hemorrhage, acute infarction or space-occupying mass lesion noted. Vascular: No hyperdense vessel or unexpected calcification. Skull: Normal. Negative for fracture or focal lesion. Sinuses/Orbits: No acute finding. Chronic soft tissue changes are noted in the left ethmoid sinuses. Other: None. IMPRESSION: Mild atrophic changes without acute abnormality. Electronically Signed   By: Inez Catalina M.D.   On: 07/01/2020 15:05   NM GI Blood  Loss  Result Date: 07/02/2020 CLINICAL DATA:  Acute lower GI bleeding, 3 episodes of large volume hematochezia. History of lung cancer EXAM: NUCLEAR MEDICINE GASTROINTESTINAL BLEEDING SCAN TECHNIQUE: Sequential abdominal images were obtained following intravenous administration of Tc-75m labeled red blood cells. RADIOPHARMACEUTICALS:  22.7 mCi Tc-97m pertechnetate in-vitro labeled red cells. COMPARISON:  05/21/2020 CT with contrast FINDINGS: Abnormal activity within the mid and lower abdomen extending into the upper pelvis and peristalsing throughout loops of bowel compatible with mild acute GI bleeding. Based on the pattern of peristalsis, it is difficult to determine location but small bowel is favored over colon. IMPRESSION: Positive exam for mild acute GI bleeding as above. These results will be called to the ordering clinician or representative by the Radiologist Assistant, and communication documented in the PACS or [employer]. Electronically Signed   By: Jerilynn Mages.  Shick M.D.   On: 07/02/2020 12:08   US RENAL  Result Date: 07/01/2020 CLINICAL DATA:  Acute kidney injury. EXAM: RENAL / URINARY TRACT ULTRASOUND COMPLETE COMPARISON:  CT abdomen pelvis dated May 21, 2020. FINDINGS: Right Kidney: Renal measurements: 10.0 x 4.2 x 5.0 cm = volume: 109 mL. Echogenicity within normal limits. No mass or hydronephrosis visualized. Left Kidney: Renal measurements: 9.1 x 5.1 x 5.3 cm = volume: 128 mL. Echogenicity within normal limits. No mass or hydronephrosis visualized. Other: Bilateral pleural effusions. IMPRESSION: 1. Normal renal ultrasound. 2. Bilateral pleural effusions. Electronically Signed   By: Titus Dubin M.D.   On: 07/01/2020 15:55   DG Chest Port 1 View  Result Date: 07/01/2020 CLINICAL DATA:  Shortness of breath EXAM: PORTABLE CHEST 1 VIEW COMPARISON:  April 04, 2019 FINDINGS: Port-A-Cath tip is in the superior vena cava.  No pneumothorax. There is a rather minimal right pleural effusion. No  edema or airspace opacity. Heart size and pulmonary vascularity are normal. No adenopathy. No bone lesions. IMPRESSION: Port-A-Cath as noted. Other minimal right pleural effusion. Lungs otherwise clear. Cardiac silhouette normal. Electronically Signed   By: Lowella Grip III M.D.   On: 07/01/2020 11:44       Subjective: Patient reports feeling tired today No acute events overnight No evidence or reports of bleeding  Objective: Vitals:   07/06/20 0600 07/06/20 0700 07/06/20 0800 07/06/20 0830  BP:  (!) 143/80 133/87   Pulse:      Resp: (!) 36 (!) 27 (!) 23   Temp:    98.5 F (36.9 C)  TempSrc:    Oral  SpO2: 95%     Weight:      Height:        Intake/Output Summary (Last 24 hours) at 07/06/2020 1045 Last data filed at 07/06/2020 0901 Gross per 24 hour  Intake 1367.86 ml  Output --  Net 1367.86 ml   Filed Weights   07/03/20 0454 07/04/20 0500 07/05/20 0500  Weight: 60 kg 60 kg 61 kg    Examination:  General exam: Appears calm and comfortable, CHRONICALLY ILL-APPEARING Respiratory system: Clear to auscultation. Respiratory effort normal. Cardiovascular system: S1 & S2 heard, RRR. No JVD, murmurs, rubs, gallops or clicks. No pedal edema. Gastrointestinal system: Abdomen is nondistended, soft and nontender. No organomegaly or masses felt. Normal bowel sounds heard. Central nervous system: Alert and oriented. No focal neurological deficits. Extremities: Symmetric 5 x 5 power.WWP, no contractures Skin: No rashes, lesions or ulcers Psychiatry: Judgement and insight appear normal. Mood & affect appropriate althoughREMAINS somewhat flat.     Data Reviewed: I have personally reviewed following labs and imaging studies  CBC: Recent Labs  Lab 07/02/20 1428 07/02/20 1800 07/02/20 2100 07/03/20 0621 07/04/20 0000 07/04/20 0538 07/04/20 1700 07/05/20 0620 07/05/20 1615 07/06/20 0500  WBC 0.4* 0.3* 0.4*   < > 0.5* 0.6* 0.6* 0.6* 0.7* 0.9*  NEUTROABS 0.0* 0.0* 0.0*   --  0.1*  --   --   --   --  0.3*  HGB 7.3* 6.0* 4.8*   < > 8.9* 8.8* 8.7* 7.8* 8.1* 8.0*  HCT 20.2* 16.8* 13.7*   < > 25.7* 25.1* 24.9* 22.8* 24.4* 23.3*  MCV 89.8 90.8 91.9   < > 87.7 86.3 86.5 87.4 89.1 89.3  PLT 37* 82* 68*   < > 38* 25* 30* 9* 35* 24*   < > = values in this interval not displayed.   Basic Metabolic Panel: Recent Labs  Lab 07/02/20 0308 07/02/20 0930 07/03/20 0621 07/04/20 0538 07/05/20 0620 07/06/20 0500  NA 133* 136 137 139 139 140  K 3.1* 2.9* 2.7* 3.8 3.3* 3.6  CL 105 110 111 115* 115* 115*  CO2 14* 13* 15* 16* 17* 17*  GLUCOSE 105* 99 101* 93 113* 98  BUN 94* 97* 89* 83* 67* 48*  CREATININE 4.59* 4.96* 3.97* 3.27* 2.79* 2.25*  CALCIUM 7.0* 6.6* 6.8* 7.1* 7.3* 7.4*  MG 1.8  --  1.8 1.8 1.6* 1.4*  PHOS 8.9*  --   --  4.9* 4.1  --    GFR: Estimated Creatinine Clearance: 25.8 mL/min (A) (by C-G formula based on SCr of 2.25 mg/dL (H)). Liver Function Tests: Recent Labs  Lab 07/01/20 1155 07/01/20 1320 07/04/20 0538  AST QUESTIONABLE RESULTS, RECOMMEND RECOLLECT TO VERIFY 68* 39  ALT QUESTIONABLE RESULTS, RECOMMEND RECOLLECT TO  VERIFY 32 28  ALKPHOS QUESTIONABLE RESULTS, RECOMMEND RECOLLECT TO VERIFY 62 53  BILITOT QUESTIONABLE RESULTS, RECOMMEND RECOLLECT TO VERIFY 0.5 0.7  PROT QUESTIONABLE RESULTS, RECOMMEND RECOLLECT TO VERIFY 4.4* 4.1*  ALBUMIN QUESTIONABLE RESULTS, RECOMMEND RECOLLECT TO VERIFY 1.4* 1.4*   Recent Labs  Lab 07/01/20 1155 07/01/20 1320  LIPASE QUESTIONABLE RESULTS, RECOMMEND RECOLLECT TO VERIFY 55*   No results for input(s): AMMONIA in the last 168 hours. Coagulation Profile: Recent Labs  Lab 07/01/20 1156 07/02/20 0452  INR 1.4* 1.3*   Cardiac Enzymes: No results for input(s): CKTOTAL, CKMB, CKMBINDEX, TROPONINI in the last 168 hours. BNP (last 3 results) No results for input(s): PROBNP in the last 8760 hours. HbA1C: No results for input(s): HGBA1C in the last 72 hours. CBG: Recent Labs  Lab 07/05/20 1548  07/05/20 1948 07/06/20 0000 07/06/20 0325 07/06/20 0734  GLUCAP 117* 109* 104* 111* 93   Lipid Profile: No results for input(s): CHOL, HDL, LDLCALC, TRIG, CHOLHDL, LDLDIRECT in the last 72 hours. Thyroid Function Tests: No results for input(s): TSH, T4TOTAL, FREET4, T3FREE, THYROIDAB in the last 72 hours. Anemia Panel: No results for input(s): VITAMINB12, FOLATE, FERRITIN, TIBC, IRON, RETICCTPCT in the last 72 hours. Sepsis Labs: Recent Labs  Lab 07/01/20 1151 07/01/20 1416 07/02/20 0308  LATICACIDVEN >11.0* >11.0* 1.0    Recent Results (from the past 240 hour(s))  Blood culture (routine single)     Status: Abnormal   Collection Time: 07/01/20 11:53 AM   Specimen: BLOOD  Result Value Ref Range Status   Specimen Description   Final    BLOOD LEFT ANTECUBITAL Performed at [facility] [address] [place], [place] [address]    Special Requests   Final    BOTTLES DRAWN AEROBIC AND ANAEROBIC Blood Culture adequate volume Performed at [facility] [address], [place], [place] [address]    Culture  Setup Time   Final    GRAM POSITIVE COCCI IN CLUSTERS IN BOTH AEROBIC AND ANAEROBIC BOTTLES CRITICAL RESULT CALLED TO, READ BACK BY AND VERIFIED WITH: Amelia AT [place] ON 07/02/20 Performed at [facility], [place] [address] [place], [place] [address]    Culture STAPHYLOCOCCUS HOMINIS (A)  Final   Report Status 07/06/2020 FINAL  Final   Organism ID, Bacteria STAPHYLOCOCCUS HOMINIS  Final      Susceptibility   Staphylococcus hominis - MIC*    CIPROFLOXACIN <=0.5 SENSITIVE Sensitive     ERYTHROMYCIN >=8 RESISTANT Resistant     GENTAMICIN <=0.5 SENSITIVE Sensitive     OXACILLIN <=0.25 SENSITIVE Sensitive     TETRACYCLINE <=1 SENSITIVE Sensitive     VANCOMYCIN <=0.5 SENSITIVE Sensitive     TRIMETH/SULFA 20 SENSITIVE Sensitive     CLINDAMYCIN <=0.25 SENSITIVE Sensitive     RIFAMPIN <=0.5 SENSITIVE Sensitive      Inducible Clindamycin NEGATIVE Sensitive     * STAPHYLOCOCCUS HOMINIS  Blood Culture ID Panel (Reflexed)     Status: Abnormal   Collection Time: 07/01/20 11:53 AM  Result Value Ref Range Status   Enterococcus faecalis NOT DETECTED NOT DETECTED Final   Enterococcus Faecium NOT DETECTED NOT DETECTED Final   Listeria monocytogenes NOT DETECTED NOT DETECTED Final   Staphylococcus species DETECTED (A) NOT DETECTED Final    Comment: CRITICAL RESULT CALLED TO, READ BACK BY AND VERIFIED WITH: PHARMD JUSTIN LEGGE BY KJ AT 1012 ON [id number]    Staphylococcus aureus (BCID) NOT DETECTED NOT DETECTED Final   Staphylococcus epidermidis  NOT DETECTED NOT DETECTED Final   Staphylococcus lugdunensis NOT DETECTED NOT DETECTED Final   Streptococcus species NOT DETECTED NOT DETECTED Final   Streptococcus agalactiae NOT DETECTED NOT DETECTED Final   Streptococcus pneumoniae NOT DETECTED NOT DETECTED Final   Streptococcus pyogenes NOT DETECTED NOT DETECTED Final   A.calcoaceticus-baumannii NOT DETECTED NOT DETECTED Final   Bacteroides fragilis NOT DETECTED NOT DETECTED Final   Enterobacterales NOT DETECTED NOT DETECTED Final   Enterobacter cloacae complex NOT DETECTED NOT DETECTED Final   Escherichia coli NOT DETECTED NOT DETECTED Final   Klebsiella aerogenes NOT DETECTED NOT DETECTED Final   Klebsiella oxytoca NOT DETECTED NOT DETECTED Final   Klebsiella pneumoniae NOT DETECTED NOT DETECTED Final   Proteus species NOT DETECTED NOT DETECTED Final   Salmonella species NOT DETECTED NOT DETECTED Final   Serratia marcescens NOT DETECTED NOT DETECTED Final   Haemophilus influenzae NOT DETECTED NOT DETECTED Final   Neisseria meningitidis NOT DETECTED NOT DETECTED Final   Pseudomonas aeruginosa NOT DETECTED NOT DETECTED Final   Stenotrophomonas maltophilia NOT DETECTED NOT DETECTED Final   Candida albicans NOT DETECTED NOT DETECTED Final   Candida auris NOT DETECTED NOT DETECTED Final   Candida glabrata NOT  DETECTED NOT DETECTED Final   Candida krusei NOT DETECTED NOT DETECTED Final   Candida parapsilosis NOT DETECTED NOT DETECTED Final   Candida tropicalis NOT DETECTED NOT DETECTED Final   Cryptococcus neoformans/gattii NOT DETECTED NOT DETECTED Final    Comment: Performed at [facility], [address], [place], [place] [address]  Resp Panel by RT-PCR (Flu A&B, Covid) Nasopharyngeal Swab     Status: None   Collection Time: 07/01/20  2:05 PM   Specimen: Nasopharyngeal Swab; Nasopharyngeal(NP) swabs in vial transport medium  Result Value Ref Range Status   SARS Coronavirus 2 by RT PCR NEGATIVE NEGATIVE Final    Comment: (NOTE) SARS-CoV-2 target nucleic acids are NOT DETECTED.  The SARS-CoV-2 RNA is generally detectable in upper respiratory specimens during the acute phase of infection. The lowest concentration of SARS-CoV-2 viral copies this assay can detect is 138 copies/mL. A negative result does not preclude SARS-Cov-2 infection and should not be used as the sole basis for treatment or other patient management decisions. A negative result may occur with  improper specimen collection/handling, submission of specimen other than nasopharyngeal swab, presence of viral mutation(s) within the areas targeted by this assay, and inadequate number of viral copies(<138 copies/mL). A negative result must be combined with clinical observations, patient history, and epidemiological information. The expected result is Negative.  Fact Sheet for Patients:  EntrepreneurPulse.com.au  Fact Sheet for Healthcare Providers:  IncredibleEmployment.be  This test is no t yet approved or cleared by the [place] FDA and  has been authorized for detection and/or diagnosis of SARS-CoV-2 by FDA under an Emergency Use Authorization (EUA). This EUA will remain  in effect (meaning this test can be used) for the duration of the COVID-19 declaration under Section  564(b)(1) of the Act, 21 U.S.C.section 360bbb-3(b)(1), unless the authorization is terminated  or revoked sooner.       Influenza A by PCR NEGATIVE NEGATIVE Final   Influenza B by PCR NEGATIVE NEGATIVE Final    Comment: (NOTE) The Xpert Xpress SARS-CoV-2/FLU/RSV plus assay is intended as an aid in the diagnosis of influenza from Nasopharyngeal swab specimens and should not be used as a sole basis for treatment. Nasal washings and aspirates are unacceptable for Xpert Xpress SARS-CoV-2/FLU/RSV testing.  Fact Sheet for Patients:  EntrepreneurPulse.com.au  Fact Sheet for Healthcare Providers: IncredibleEmployment.be  This test is not yet approved or cleared by the [place] FDA and has been authorized for detection and/or diagnosis of SARS-CoV-2 by FDA under an Emergency Use Authorization (EUA). This EUA will remain in effect (meaning this test can be used) for the duration of the COVID-19 declaration under Section 564(b)(1) of the Act, 21 U.S.C. section 360bbb-3(b)(1), unless the authorization is terminated or revoked.  Performed at [facility], [place] [address], [place], [place] [address]   Urine culture     Status: None   Collection Time: 07/01/20  2:16 PM   Specimen: In/Out Cath Urine  Result Value Ref Range Status   Specimen Description   Final    IN/OUT CATH URINE Performed at [facility] [address], [place], [place] [address]    Special Requests   Final    NONE Performed at [facility], [place] [address] [place], [place] [address]    Culture   Final    NO GROWTH Performed at [facility], [place] [address], [place], [place] [address]    Report Status 07/04/2020 FINAL  Final  MRSA PCR Screening     Status: Abnormal   Collection Time: 07/01/20  4:57 PM   Specimen: Nasopharyngeal  Result Value Ref Range Status   MRSA by PCR POSITIVE (A) NEGATIVE Final    Comment:         The GeneXpert MRSA Assay (FDA approved for NASAL specimens only), is one component of a comprehensive MRSA colonization surveillance program. It is not intended to diagnose MRSA infection nor to guide or monitor treatment for MRSA infections. RESULT CALLED TO, READ BACK BY AND VERIFIED WITH: BARKSDALE,T. RN @1932  ON 04.11.2022 BY COHEN,K Performed at [facility], [place] [address], [place],  [address]          Radiology Studies: No results found.      Scheduled Meds: . sodium chloride   Intravenous Once  . Chlorhexidine Gluconate Cloth  6 each Topical Daily  . insulin aspart  0-9 Units Subcutaneous Q4H  . mouth rinse  15 mL Mouth Rinse BID  . pantoprazole (PROTONIX) IV  40 mg Intravenous Q12H  . sodium chloride flush  10-40 mL Intracatheter Q12H  . Tbo-filgastrim (GRANIX) SQ  300 mcg Subcutaneous q1800   Continuous Infusions: . lactated ringers 125 mL/hr at 07/05/20 1800     LOS: 5 days    Time spent: [place], MD [facility]  If 7PM-7AM, please contact night-coverage  07/06/2020, 10:45 AM

## 2020-07-07 ENCOUNTER — Inpatient Hospital Stay (HOSPITAL_COMMUNITY): Payer: Medicaid Other

## 2020-07-07 DIAGNOSIS — N179 Acute kidney failure, unspecified: Secondary | ICD-10-CM | POA: Diagnosis not present

## 2020-07-07 DIAGNOSIS — T827XXD Infection and inflammatory reaction due to other cardiac and vascular devices, implants and grafts, subsequent encounter: Secondary | ICD-10-CM | POA: Diagnosis not present

## 2020-07-07 DIAGNOSIS — K922 Gastrointestinal hemorrhage, unspecified: Secondary | ICD-10-CM | POA: Diagnosis not present

## 2020-07-07 DIAGNOSIS — D649 Anemia, unspecified: Secondary | ICD-10-CM | POA: Diagnosis not present

## 2020-07-07 LAB — BASIC METABOLIC PANEL
Anion gap: 6 (ref 5–15)
BUN: 36 mg/dL — ABNORMAL HIGH (ref 6–20)
CO2: 20 mmol/L — ABNORMAL LOW (ref 22–32)
Calcium: 7.3 mg/dL — ABNORMAL LOW (ref 8.9–10.3)
Chloride: 113 mmol/L — ABNORMAL HIGH (ref 98–111)
Creatinine, Ser: 1.99 mg/dL — ABNORMAL HIGH (ref 0.44–1.00)
GFR, Estimated: 29 mL/min — ABNORMAL LOW (ref >60)
Glucose, Bld: 92 mg/dL (ref 70–99)
Potassium: 3.3 mmol/L — ABNORMAL LOW (ref 3.5–5.1)
Sodium: 139 mmol/L (ref 135–145)

## 2020-07-07 LAB — GLUCOSE, CAPILLARY
Glucose-Capillary: 103 mg/dL — ABNORMAL HIGH (ref 70–99)
Glucose-Capillary: 122 mg/dL — ABNORMAL HIGH (ref 70–99)
Glucose-Capillary: 82 mg/dL (ref 70–99)
Glucose-Capillary: 90 mg/dL (ref 70–99)
Glucose-Capillary: 93 mg/dL (ref 70–99)
Glucose-Capillary: 94 mg/dL (ref 70–99)
Glucose-Capillary: 99 mg/dL (ref 70–99)

## 2020-07-07 LAB — CBC WITH DIFFERENTIAL/PLATELET
Abs Immature Granulocytes: 0 10*3/uL (ref 0.00–0.07)
Band Neutrophils: 7 %
Basophils Absolute: 0 10*3/uL (ref 0.0–0.1)
Basophils Relative: 0 %
Eosinophils Absolute: 0 10*3/uL (ref 0.0–0.5)
Eosinophils Relative: 0 %
HCT: 23.9 % — ABNORMAL LOW (ref 36.0–46.0)
Hemoglobin: 7.8 g/dL — ABNORMAL LOW (ref 12.0–15.0)
Lymphocytes Relative: 36 %
Lymphs Abs: 0.4 10*3/uL — ABNORMAL LOW (ref 0.7–4.0)
MCH: 30.1 pg (ref 26.0–34.0)
MCHC: 32.6 g/dL (ref 30.0–36.0)
MCV: 92.3 fL (ref 80.0–100.0)
Monocytes Absolute: 0.2 10*3/uL (ref 0.1–1.0)
Monocytes Relative: 14 %
Neutro Abs: 0.6 10*3/uL — ABNORMAL LOW (ref 1.7–7.7)
Neutrophils Relative %: 43 %
Platelets: 15 10*3/uL — CL (ref 150–400)
RBC: 2.59 MIL/uL — ABNORMAL LOW (ref 3.87–5.11)
RDW: 15 % (ref 11.5–15.5)
WBC: 1.2 10*3/uL — CL (ref 4.0–10.5)
nRBC: 0 % (ref 0.0–0.2)
nRBC: 4 /100 WBC — ABNORMAL HIGH

## 2020-07-07 LAB — HEMOGLOBIN AND HEMATOCRIT, BLOOD
HCT: 25.8 % — ABNORMAL LOW (ref 36.0–46.0)
Hemoglobin: 8.5 g/dL — ABNORMAL LOW (ref 12.0–15.0)

## 2020-07-07 LAB — PREPARE RBC (CROSSMATCH)

## 2020-07-07 IMAGING — DX DG CHEST 1V PORT
1 series · 1 of 1 positions shown · non-contrast
Comparison: [DATE]

CLINICAL DATA: History of lung carcinoma with weakness

EXAM:
PORTABLE CHEST 1 VIEW

[chest ap]
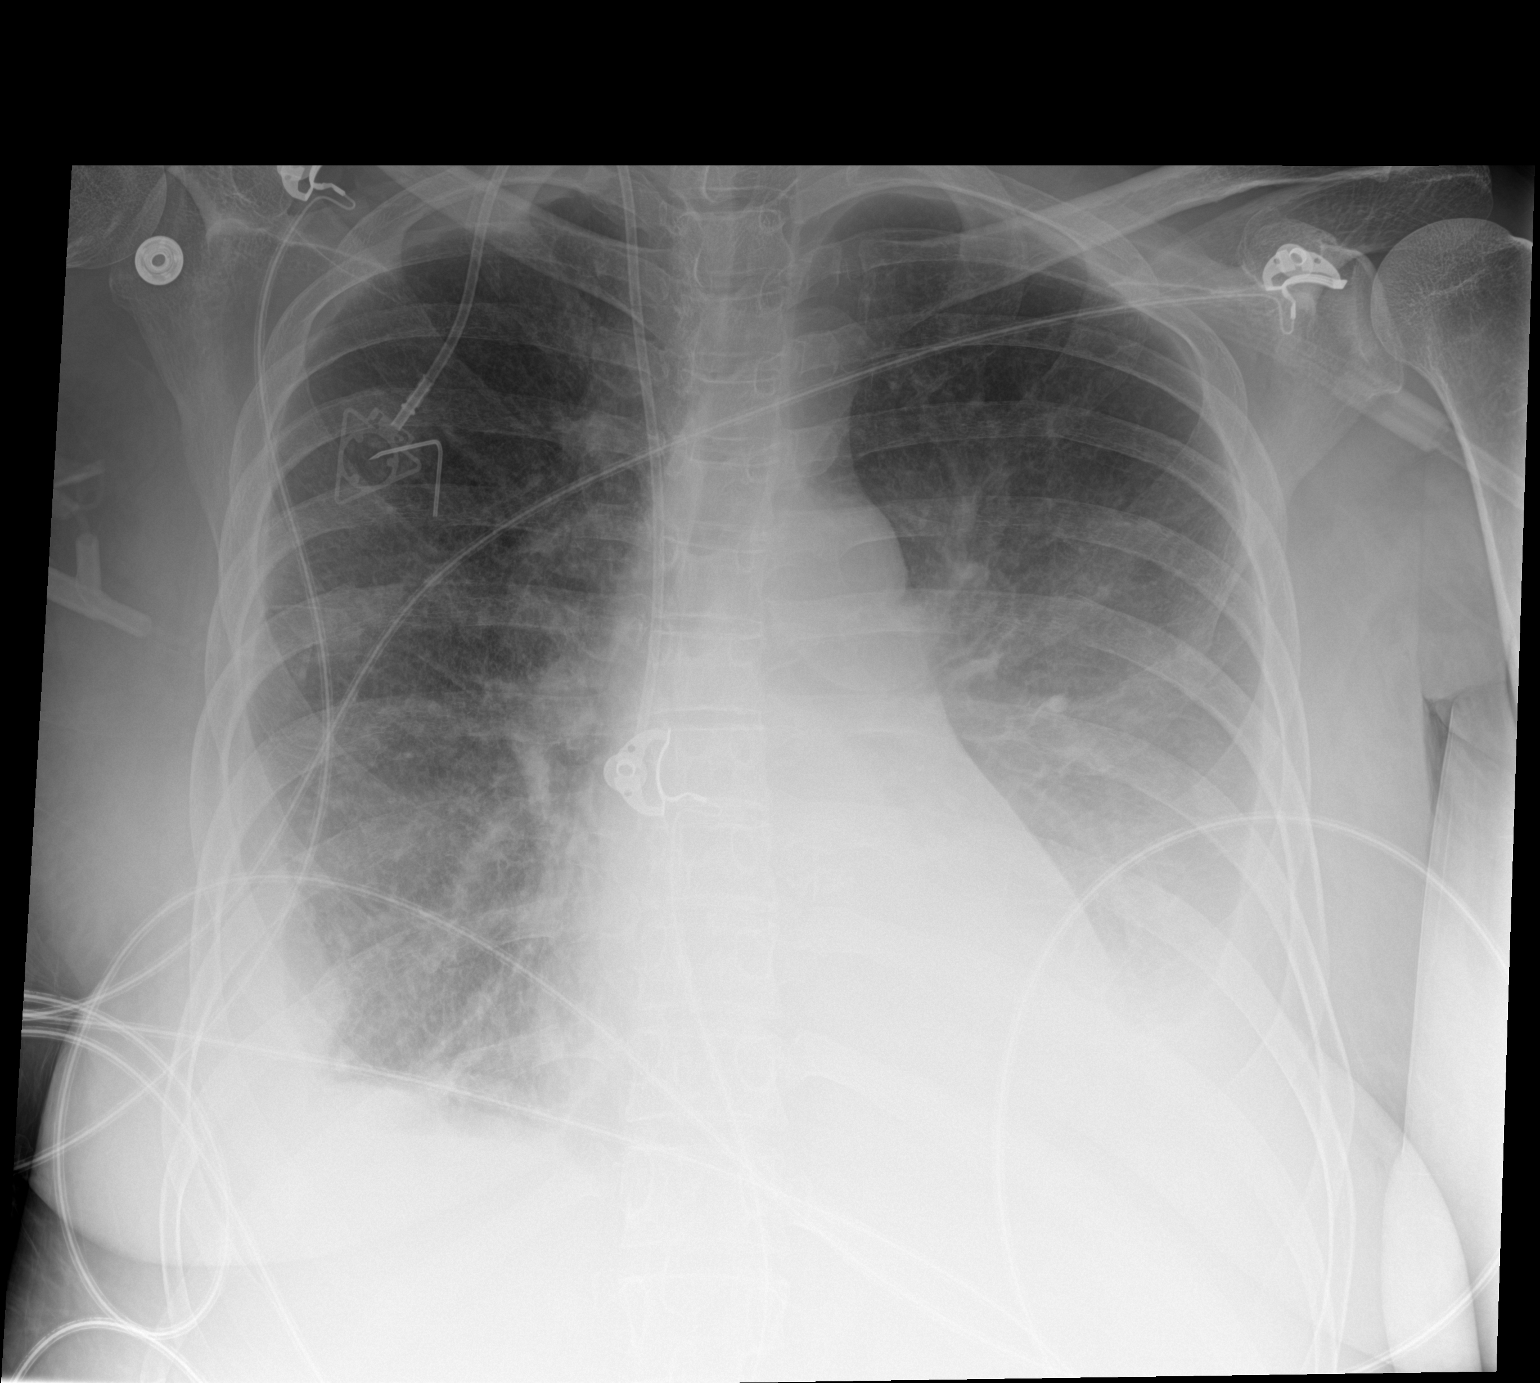

[1 of 1 positions shown; findings below may reference images not displayed]

FINDINGS: Cardiac shadow is stable. Right-sided chest wall port is again noted
and stable. New bilateral pleural effusions are noted left greater
than right with likely underlying atelectasis. No acute bony
abnormality is seen.
IMPRESSION: New bilateral pleural effusions left greater than right.

## 2020-07-07 MED ORDER — AMLODIPINE BESYLATE 5 MG PO TABS
5.0000 mg | ORAL_TABLET | Freq: Every day | ORAL | Status: DC
  Administered 2020-07-07 – 2020-07-12 (×6): 5 mg via ORAL
  Filled 2020-07-07 (×6): qty 1

## 2020-07-07 MED ORDER — POTASSIUM CHLORIDE CRYS ER 20 MEQ PO TBCR
40.0000 meq | EXTENDED_RELEASE_TABLET | Freq: Once | ORAL | Status: AC
  Administered 2020-07-07: 40 meq via ORAL
  Filled 2020-07-07: qty 2

## 2020-07-07 MED ORDER — PANTOPRAZOLE SODIUM 40 MG IV SOLR
40.0000 mg | INTRAVENOUS | Status: DC
  Administered 2020-07-08 – 2020-07-14 (×7): 40 mg via INTRAVENOUS
  Filled 2020-07-07 (×7): qty 40

## 2020-07-07 MED ORDER — SODIUM CHLORIDE 0.9% IV SOLUTION: Freq: Once | INTRAVENOUS | Status: AC

## 2020-07-07 MED ORDER — HYDRALAZINE HCL 20 MG/ML IJ SOLN
10.0000 mg | Freq: Four times a day (QID) | INTRAMUSCULAR | Status: DC | PRN
  Administered 2020-07-23: 10 mg via INTRAVENOUS
  Filled 2020-07-07 (×2): qty 1

## 2020-07-07 MED ORDER — METOPROLOL TARTRATE 5 MG/5ML IV SOLN
5.0000 mg | INTRAVENOUS | Status: DC | PRN
  Administered 2020-07-07: 5 mg via INTRAVENOUS
  Filled 2020-07-07: qty 5

## 2020-07-07 NOTE — Progress Notes (Signed)
PROGRESS NOTE    Brenda [name]  FWY:[id number] DOB: November 20, 1960 DOA: 07/01/2020 PCP: Pcp, No   Brief Narrative:  Brenda [name] is a 60 years old female with past medical history of stage IV non-small cell lung cancer- adenocarcinoma of right lung, depression undergoing chemotherapy with Dr. Julien Nordmann presented tothehospital with epistaxis,dark stools and generalized weakness for few days. On initial presentation,blood pressure was stable but patientwas mildly hypothermicCXRshowedPort-A-Cath in place and minimal right pleural effusion but otherwise clear. Her lactic acid was >11, with INR 1.4 andhad AKI withcreatinine over 5 without hyperkalemia and metabolic acidosis. Patient received PRBC in the ED and was more somnolent and had focal seizure-like activity. She was given Ativan and Keppra andhead CT was done. Bear hugger was initiated and the patient was admitted to the hospital. Hemoglobin was around 2.4 subsequently and was given packed RBC. Of note patient was initiated on chemotherapy on 09/28/2019 for 11 cycles and current regimen of with carboplatin for an AUC of 5, Alimta 500 mg/m2, and Keytruda 200 mg IV every 3 weeks. Alimtawasheld due to renal insufficiency and neuropenia.  During hospitalization, patient was seen by GI and oncology. Patient has been having pancytopenia with ongoing bleeding and conservative management has been pursued at this time   Assessment & Plan:   Principal Problem:   Bacteremia associated with intravascular line ([place]) Active Problems:   Malignant neoplasm of right lung (HCC)   Hypokalemia   Acute on chronic anemia   Gastrointestinal hemorrhage   Pancytopenia (HCC)   Renal failure   AKI (acute kidney injury) ([place])   Acute blood loss anemia   Pancytopenia: Likely secondary to pemetrexed toxicity. Status post multiple units of packed RBC and platelet transfusion.On Granix as per oncology.Oncology recommends Granix daily until  [facility] is above 1.5. Willcontinue totransfuse for hemoglobin less than 8 and platelet count less than 20,000 or active bleeding.. Plt 15-transfuse 1 unit, Hgb 7.8 transfuse 1u  Acute renal failure: Likely secondary tochemotherapy and severe anemia with possible volume depletion. Received several liters of IV fluidduring hospitalization.. Renal ultrasound was within normal limits. Currently on maintenancefluids with Ringer lactate at 125 mL/h. Continue to monitor. Creatinine is trending down.   Acute GI bleed. Patient had RBC scan showing mild acute GI bleeding in the small bowel. Unlikely to yield muchfrom angiogram/ embolization as per GI.Marland Kitchen Likely mucosal bleeding and GIrecommended platelet and RBC transfusion as above. Seen by GI during hospitalization. Continue Protonix twice daily.Transfuse as necessary.  RUE exttemity phlebitis: -mildly inflammed at site of "needle stick" per pt -distal by wrist, neurovascularly intact -Warm compresses elevate right upper extremity  Stage IV lung cancer:Adenocarcinomaby biopsy. Status post 2 cycles of pemetrexed and pembrolizumabmoncology on board. Transfuse if hemoglobin less than 8as per oncology.  Seizure:  Unclear of etiology. Possibility of hypoxia. CT scan without any acute findings.   Lactic acidosis: Chest x-ray was clear. UA unremarkable. Resolved improved tissue perfusion. No source of infection. Latest lactate of 1.0.  Significant hypokalemia.Replenished IV KCl. Potassium of 3.3today. Repleting,Check levels in am.  Hypomagnesemia: repleted and recheck in AM  Coagulase-negative staph bacteremia.Only 1 set of blood culture was obtained. Both aerobic and anaerobic bottles were positive. Received vancomycin. Check levels and dose according to ID  DVT prophylaxis: SCD/Compression stockings  Code Status: FULL    Code Status Orders  (From admission, onward)         Start     Ordered    07/01/20 1433  Full code  Continuous  07/01/20 1433        Code Status History    Date Active Date Inactive Code Status Order ID Comments User Context   04/07/2020 2042 04/11/2020 1909 Full Code [id number]  Rhetta Mura, DO ED   04/03/2019 1507 04/05/2019 2210 Full Code [id number]  Lyndee Hensen, DO ED   05/30/2017 1558 06/03/2017 0006 Full Code [id number]  Patrecia Pour, NP Inpatient   05/29/2017 1508 05/30/2017 1525 Full Code [id number]  Isla Pence, MD ED   12/24/2011 0140 12/25/2011 2040 Full Code [id number]  Truddie Hidden., MD ED   Advance Care Planning Activity     Family Communication: Discussed the case with patient's friend Mr. Leeanne Deed emergency contact, answered all questions Status is: Inpatient  Remains inpatient appropriate because:IV treatments appropriate due to intensity of illness or inability to take PO and Inpatient level of care appropriate due to severity of illnessongoing GI bleed, severe pancytopenia, concern for bacteremia on IV antibiotics  Dispo: The patient is from: Home Anticipated d/c is to: [place],will get PT evaluation.Continue to monitor closely.Will need to follow GI oncology recommendations. Patient currently is not medically stable to d/c. Difficult to place patient No  Consultants:  PCCM  Infectious disease  Medical oncology  GI  Procedures:  CT Head Wo Contrast  Result Date: 07/01/2020 CLINICAL DATA:  Recent seizure activity EXAM: CT HEAD WITHOUT CONTRAST TECHNIQUE: Contiguous axial images were obtained from the base of the skull through the vertex without intravenous contrast. COMPARISON:  04/07/2020 FINDINGS: Brain: Mild atrophic changes are noted. No findings to suggest acute hemorrhage, acute infarction or space-occupying mass lesion noted. Vascular: No hyperdense vessel or unexpected calcification. Skull: Normal. Negative for fracture or focal lesion. Sinuses/Orbits: No acute  finding. Chronic soft tissue changes are noted in the left ethmoid sinuses. Other: None. IMPRESSION: Mild atrophic changes without acute abnormality. Electronically Signed   By: Inez Catalina M.D.   On: 07/01/2020 15:05   NM GI Blood Loss  Result Date: 07/02/2020 CLINICAL DATA:  Acute lower GI bleeding, 3 episodes of large volume hematochezia. History of lung cancer EXAM: NUCLEAR MEDICINE GASTROINTESTINAL BLEEDING SCAN TECHNIQUE: Sequential abdominal images were obtained following intravenous administration of Tc-41m labeled red blood cells. RADIOPHARMACEUTICALS:  22.7 mCi Tc-63m pertechnetate in-vitro labeled red cells. COMPARISON:  05/21/2020 CT with contrast FINDINGS: Abnormal activity within the mid and lower abdomen extending into the upper pelvis and peristalsing throughout loops of bowel compatible with mild acute GI bleeding. Based on the pattern of peristalsis, it is difficult to determine location but small bowel is favored over colon. IMPRESSION: Positive exam for mild acute GI bleeding as above. These results will be called to the ordering clinician or representative by the Radiologist Assistant, and communication documented in the PACS or [employer]. Electronically Signed   By: Jerilynn Mages.  Shick M.D.   On: 07/02/2020 12:08   US RENAL  Result Date: 07/01/2020 CLINICAL DATA:  Acute kidney injury. EXAM: RENAL / URINARY TRACT ULTRASOUND COMPLETE COMPARISON:  CT abdomen pelvis dated May 21, 2020. FINDINGS: Right Kidney: Renal measurements: 10.0 x 4.2 x 5.0 cm = volume: 109 mL. Echogenicity within normal limits. No mass or hydronephrosis visualized. Left Kidney: Renal measurements: 9.1 x 5.1 x 5.3 cm = volume: 128 mL. Echogenicity within normal limits. No mass or hydronephrosis visualized. Other: Bilateral pleural effusions. IMPRESSION: 1. Normal renal ultrasound. 2. Bilateral pleural effusions. Electronically Signed   By: Titus Dubin M.D.   On: 07/01/2020 15:55   DG Chest [facility]  Result  Date: 07/01/2020 CLINICAL DATA:  Shortness of breath EXAM: PORTABLE CHEST 1 VIEW COMPARISON:  April 04, 2019 FINDINGS: Port-A-Cath tip is in the superior vena cava. No pneumothorax. There is a rather minimal right pleural effusion. No edema or airspace opacity. Heart size and pulmonary vascularity are normal. No adenopathy. No bone lesions. IMPRESSION: Port-A-Cath as noted. Other minimal right pleural effusion. Lungs otherwise clear. Cardiac silhouette normal. Electronically Signed   By: Lowella Grip III M.D.   On: 07/01/2020 11:44     Antimicrobials:  None currently   Subjective: Patient reports breathing is somewhat improved, Did report some mild swelling by her right wrist secondary to needlestick  Objective: Vitals:   07/07/20 0500 07/07/20 0600 07/07/20 0808 07/07/20 0841  BP: (!) 159/83 (!) 160/83  (!) 153/77  Pulse:    99  Resp: (!) 29 (!) 26  (!) 31  Temp:   98.6 F (37 C) 98.3 F (36.8 C)  TempSrc:   Oral Oral  SpO2:  96%    Weight:      Height:        Intake/Output Summary (Last 24 hours) at 07/07/2020 0928 Last data filed at 07/07/2020 0447 Gross per 24 hour  Intake --  Output 1025 ml  Net -1025 ml   Filed Weights   07/03/20 0454 07/04/20 0500 07/05/20 0500  Weight: 60 kg 60 kg 61 kg    Examination:  General exam: Appears calm and comfortable chronically ill-appearing Respiratory system: Clear to auscultation. Respiratory effort normal. Cardiovascular system: S1 & S2 heard, RRR. No JVD, murmurs, rubs, gallops or clicks. No pedal edema. Gastrointestinal system: Abdomen is nondistended, soft and nontender. No organomegaly or masses felt. Normal bowel sounds heard. Central nervous system: Alert and oriented. No focal neurological deficits. Extremities: Symmetric 5 x 5 power.  Warm well perfused findings as noted below Skin: Mild erythema and swelling at site of needlestick on right wrist.  Consistent with a phlebitis.  Good pulses no upper extremity  swelling Psychiatry: Judgement and insight appear normal. Mood & affect appropriate.     Data Reviewed: I have personally reviewed following labs and imaging studies  CBC: Recent Labs  Lab 07/02/20 1800 07/02/20 2100 07/03/20 0621 07/04/20 0000 07/04/20 0538 07/04/20 1700 07/05/20 0620 07/05/20 1615 07/06/20 0500 07/07/20 0500  WBC 0.3* 0.4*   < > 0.5*   < > 0.6* 0.6* 0.7* 0.9* 1.2*  NEUTROABS 0.0* 0.0*  --  0.1*  --   --   --   --  0.3* PENDING  HGB 6.0* 4.8*   < > 8.9*   < > 8.7* 7.8* 8.1* 8.0* 7.8*  HCT 16.8* 13.7*   < > 25.7*   < > 24.9* 22.8* 24.4* 23.3* 23.9*  MCV 90.8 91.9   < > 87.7   < > 86.5 87.4 89.1 89.3 92.3  PLT 82* 68*   < > 38*   < > 30* 9* 35* 24* 15*   < > = values in this interval not displayed.   Basic Metabolic Panel: Recent Labs  Lab 07/02/20 0308 07/02/20 0930 07/03/20 0621 07/04/20 0538 07/05/20 0620 07/06/20 0500 07/07/20 0500  NA 133*   < > 137 139 139 140 139  K 3.1*   < > 2.7* 3.8 3.3* 3.6 3.3*  CL 105   < > 111 115* 115* 115* 113*  CO2 14*   < > 15* 16* 17* 17* 20*  GLUCOSE 105*   < > 101* 93 113* 98  92  BUN 94*   < > 89* 83* 67* 48* 36*  CREATININE 4.59*   < > 3.97* 3.27* 2.79* 2.25* 1.99*  CALCIUM 7.0*   < > 6.8* 7.1* 7.3* 7.4* 7.3*  MG 1.8  --  1.8 1.8 1.6* 1.4*  --   PHOS 8.9*  --   --  4.9* 4.1  --   --    < > = values in this interval not displayed.   GFR: Estimated Creatinine Clearance: 29.2 mL/min (A) (by C-G formula based on SCr of 1.99 mg/dL (H)). Liver Function Tests: Recent Labs  Lab 07/01/20 1155 07/01/20 1320 07/04/20 0538  AST QUESTIONABLE RESULTS, RECOMMEND RECOLLECT TO VERIFY 68* 39  ALT QUESTIONABLE RESULTS, RECOMMEND RECOLLECT TO VERIFY 32 28  ALKPHOS QUESTIONABLE RESULTS, RECOMMEND RECOLLECT TO VERIFY 62 53  BILITOT QUESTIONABLE RESULTS, RECOMMEND RECOLLECT TO VERIFY 0.5 0.7  PROT QUESTIONABLE RESULTS, RECOMMEND RECOLLECT TO VERIFY 4.4* 4.1*  ALBUMIN QUESTIONABLE RESULTS, RECOMMEND RECOLLECT TO VERIFY 1.4*  1.4*   Recent Labs  Lab 07/01/20 1155 07/01/20 1320  LIPASE QUESTIONABLE RESULTS, RECOMMEND RECOLLECT TO VERIFY 55*   No results for input(s): AMMONIA in the last 168 hours. Coagulation Profile: Recent Labs  Lab 07/01/20 1156 07/02/20 0452  INR 1.4* 1.3*   Cardiac Enzymes: No results for input(s): CKTOTAL, CKMB, CKMBINDEX, TROPONINI in the last 168 hours. BNP (last 3 results) No results for input(s): PROBNP in the last 8760 hours. HbA1C: No results for input(s): HGBA1C in the last 72 hours. CBG: Recent Labs  Lab 07/06/20 1612 07/06/20 1923 07/06/20 2321 07/07/20 0340 07/07/20 0733  GLUCAP 93 91 103* 99 82   Lipid Profile: No results for input(s): CHOL, HDL, LDLCALC, TRIG, CHOLHDL, LDLDIRECT in the last 72 hours. Thyroid Function Tests: No results for input(s): TSH, T4TOTAL, FREET4, T3FREE, THYROIDAB in the last 72 hours. Anemia Panel: No results for input(s): VITAMINB12, FOLATE, FERRITIN, TIBC, IRON, RETICCTPCT in the last 72 hours. Sepsis Labs: Recent Labs  Lab 07/01/20 1151 07/01/20 1416 07/02/20 0308  LATICACIDVEN >11.0* >11.0* 1.0    Recent Results (from the past 240 hour(s))  Blood culture (routine single)     Status: Abnormal   Collection Time: 07/01/20 11:53 AM   Specimen: BLOOD  Result Value Ref Range Status   Specimen Description   Final    BLOOD LEFT ANTECUBITAL Performed at [facility] [address], [place], [place] [address]    Special Requests   Final    BOTTLES DRAWN AEROBIC AND ANAEROBIC Blood Culture adequate volume Performed at [facility] [address] [place], [place] [address]    Culture  Setup Time   Final    GRAM POSITIVE COCCI IN CLUSTERS IN BOTH AEROBIC AND ANAEROBIC BOTTLES CRITICAL RESULT CALLED TO, READ BACK BY AND VERIFIED WITH: [place] AT [place] ON 07/02/20 Performed at [facility], [place] [address], [place], [place] [address]    Culture STAPHYLOCOCCUS  HOMINIS (A)  Final   Report Status 07/06/2020 FINAL  Final   Organism ID, Bacteria STAPHYLOCOCCUS HOMINIS  Final      Susceptibility   Staphylococcus hominis - MIC*    CIPROFLOXACIN <=0.5 SENSITIVE Sensitive     ERYTHROMYCIN >=8 RESISTANT Resistant     GENTAMICIN <=0.5 SENSITIVE Sensitive     OXACILLIN <=0.25 SENSITIVE Sensitive     TETRACYCLINE <=1 SENSITIVE Sensitive     VANCOMYCIN <=0.5 SENSITIVE Sensitive     TRIMETH/SULFA 20 SENSITIVE Sensitive  CLINDAMYCIN <=0.25 SENSITIVE Sensitive     RIFAMPIN <=0.5 SENSITIVE Sensitive     Inducible Clindamycin NEGATIVE Sensitive     * STAPHYLOCOCCUS HOMINIS  Blood Culture ID Panel (Reflexed)     Status: Abnormal   Collection Time: 07/01/20 11:53 AM  Result Value Ref Range Status   Enterococcus faecalis NOT DETECTED NOT DETECTED Final   Enterococcus Faecium NOT DETECTED NOT DETECTED Final   Listeria monocytogenes NOT DETECTED NOT DETECTED Final   Staphylococcus species DETECTED (A) NOT DETECTED Final    Comment: CRITICAL RESULT CALLED TO, READ BACK BY AND VERIFIED WITH: PHARMD JUSTIN LEGGE BY [facility] AT 1012 ON [id number]    Staphylococcus aureus (BCID) NOT DETECTED NOT DETECTED Final   Staphylococcus epidermidis NOT DETECTED NOT DETECTED Final   Staphylococcus lugdunensis NOT DETECTED NOT DETECTED Final   Streptococcus species NOT DETECTED NOT DETECTED Final   Streptococcus agalactiae NOT DETECTED NOT DETECTED Final   Streptococcus pneumoniae NOT DETECTED NOT DETECTED Final   Streptococcus pyogenes NOT DETECTED NOT DETECTED Final   A.calcoaceticus-baumannii NOT DETECTED NOT DETECTED Final   Bacteroides fragilis NOT DETECTED NOT DETECTED Final   Enterobacterales NOT DETECTED NOT DETECTED Final   Enterobacter cloacae complex NOT DETECTED NOT DETECTED Final   Escherichia coli NOT DETECTED NOT DETECTED Final   Klebsiella aerogenes NOT DETECTED NOT DETECTED Final   Klebsiella oxytoca NOT DETECTED NOT DETECTED Final   Klebsiella pneumoniae NOT  DETECTED NOT DETECTED Final   Proteus species NOT DETECTED NOT DETECTED Final   Salmonella species NOT DETECTED NOT DETECTED Final   Serratia marcescens NOT DETECTED NOT DETECTED Final   Haemophilus influenzae NOT DETECTED NOT DETECTED Final   Neisseria meningitidis NOT DETECTED NOT DETECTED Final   Pseudomonas aeruginosa NOT DETECTED NOT DETECTED Final   Stenotrophomonas maltophilia NOT DETECTED NOT DETECTED Final   Candida albicans NOT DETECTED NOT DETECTED Final   Candida auris NOT DETECTED NOT DETECTED Final   Candida glabrata NOT DETECTED NOT DETECTED Final   Candida krusei NOT DETECTED NOT DETECTED Final   Candida parapsilosis NOT DETECTED NOT DETECTED Final   Candida tropicalis NOT DETECTED NOT DETECTED Final   Cryptococcus neoformans/gattii NOT DETECTED NOT DETECTED Final    Comment: Performed at [facility], [address], [place], [place] [address]  Resp Panel by RT-PCR (Flu A&B, Covid) Nasopharyngeal Swab     Status: None   Collection Time: 07/01/20  2:05 PM   Specimen: Nasopharyngeal Swab; Nasopharyngeal(NP) swabs in vial transport medium  Result Value Ref Range Status   SARS Coronavirus 2 by RT PCR NEGATIVE NEGATIVE Final    Comment: (NOTE) SARS-CoV-2 target nucleic acids are NOT DETECTED.  The SARS-CoV-2 RNA is generally detectable in upper respiratory specimens during the acute phase of infection. The lowest concentration of SARS-CoV-2 viral copies this assay can detect is 138 copies/mL. A negative result does not preclude SARS-Cov-2 infection and should not be used as the sole basis for treatment or other patient management decisions. A negative result may occur with  improper specimen collection/handling, submission of specimen other than nasopharyngeal swab, presence of viral mutation(s) within the areas targeted by this assay, and inadequate number of viral copies(<138 copies/mL). A negative result must be combined with clinical observations, patient  history, and epidemiological information. The expected result is Negative.  Fact Sheet for Patients:  EntrepreneurPulse.com.au  Fact Sheet for Healthcare Providers:  IncredibleEmployment.be  This test is no t yet approved or cleared by the [place] and  has been authorized  for detection and/or diagnosis of SARS-CoV-2 by FDA under an Emergency Use Authorization (EUA). This EUA will remain  in effect (meaning this test can be used) for the duration of the COVID-19 declaration under Section 564(b)(1) of the Act, 21 U.S.C.section 360bbb-3(b)(1), unless the authorization is terminated  or revoked sooner.       Influenza A by PCR NEGATIVE NEGATIVE Final   Influenza B by PCR NEGATIVE NEGATIVE Final    Comment: (NOTE) The Xpert Xpress SARS-CoV-2/FLU/RSV plus assay is intended as an aid in the diagnosis of influenza from Nasopharyngeal swab specimens and should not be used as a sole basis for treatment. Nasal washings and aspirates are unacceptable for Xpert Xpress SARS-CoV-2/FLU/RSV testing.  Fact Sheet for Patients: EntrepreneurPulse.com.au  Fact Sheet for Healthcare Providers: IncredibleEmployment.be  This test is not yet approved or cleared by the [place] FDA and has been authorized for detection and/or diagnosis of SARS-CoV-2 by FDA under an Emergency Use Authorization (EUA). This EUA will remain in effect (meaning this test can be used) for the duration of the COVID-19 declaration under Section 564(b)(1) of the Act, 21 U.S.C. section 360bbb-3(b)(1), unless the authorization is terminated or revoked.  Performed at [facility], [place] [address], [place], [place] [address]   Urine culture     Status: None   Collection Time: 07/01/20  2:16 PM   Specimen: In/Out Cath Urine  Result Value Ref Range Status   Specimen Description   Final    IN/OUT CATH URINE Performed at  [facility] [address] [place], [place] [address]    Special Requests   Final    NONE Performed at [facility], [place] [address] [place], [place] [address]    Culture   Final    NO GROWTH Performed at [facility], [place] [address], [place], [place] [address]    Report Status 07/04/2020 FINAL  Final  MRSA PCR Screening     Status: Abnormal   Collection Time: 07/01/20  4:57 PM   Specimen: Nasopharyngeal  Result Value Ref Range Status   MRSA by PCR POSITIVE (A) NEGATIVE Final    Comment:        The GeneXpert MRSA Assay (FDA approved for NASAL specimens only), is one component of a comprehensive MRSA colonization surveillance program. It is not intended to diagnose MRSA infection nor to guide or monitor treatment for MRSA infections. RESULT CALLED TO, READ BACK BY AND VERIFIED WITH: BARKSDALE,T. RN @1932  ON 04.11.2022 BY COHEN,K Performed at [facility], [place] [address], [place], [place] [address]          Radiology Studies: No results found.      Scheduled Meds: . sodium chloride   Intravenous Once  . sodium chloride   Intravenous Once  . Chlorhexidine Gluconate Cloth  6 each Topical Daily  . insulin aspart  0-9 Units Subcutaneous Q4H  . mouth rinse  15 mL Mouth Rinse BID  . pantoprazole (PROTONIX) IV  40 mg Intravenous Q12H  . sodium chloride flush  10-40 mL Intracatheter Q12H  . Tbo-filgastrim (GRANIX) SQ  300 mcg Subcutaneous q1800   Continuous Infusions: .  ceFAZolin (ANCEF) IV 1 g (07/07/20 0921)  . lactated ringers 125 mL/hr at 07/07/20 0219     LOS: 6 days    Time spent: [place], MD [facility]  If 7PM-7AM, please contact night-coverage  07/07/2020, 9:28 AM

## 2020-07-07 NOTE — Progress Notes (Signed)
Brenda [name] 11:33 AM  Subjective: Patient doing well without signs of bleeding and no GI complaints and no other new complaints or problems  Objective: Signs stable afebrile no acute distress abdomen is soft nontender BUN and creatinine continue to improve platelets continue to decrease other white count and hemoglobin stable  Assessment: GI blood loss currently stable  Plan: We will decrease Protonix to once a day can probably change to p.o. tomorrow we will advance to soft diet and will check on tomorrow but call us if any further question or problem or signs of active bleeding  [facility]  office [phone] After 5PM or if no answer call [phone]

## 2020-07-08 ENCOUNTER — Encounter (HOSPITAL_COMMUNITY): Payer: Self-pay | Admitting: Pulmonary Disease

## 2020-07-08 DIAGNOSIS — T827XXA Infection and inflammatory reaction due to other cardiac and vascular devices, implants and grafts, initial encounter: Secondary | ICD-10-CM | POA: Diagnosis not present

## 2020-07-08 DIAGNOSIS — K922 Gastrointestinal hemorrhage, unspecified: Secondary | ICD-10-CM | POA: Diagnosis not present

## 2020-07-08 DIAGNOSIS — B9561 Methicillin susceptible Staphylococcus aureus infection as the cause of diseases classified elsewhere: Secondary | ICD-10-CM

## 2020-07-08 DIAGNOSIS — T827XXD Infection and inflammatory reaction due to other cardiac and vascular devices, implants and grafts, subsequent encounter: Secondary | ICD-10-CM | POA: Diagnosis not present

## 2020-07-08 DIAGNOSIS — D62 Acute posthemorrhagic anemia: Secondary | ICD-10-CM | POA: Diagnosis not present

## 2020-07-08 DIAGNOSIS — N179 Acute kidney failure, unspecified: Secondary | ICD-10-CM | POA: Diagnosis not present

## 2020-07-08 DIAGNOSIS — D649 Anemia, unspecified: Secondary | ICD-10-CM | POA: Diagnosis not present

## 2020-07-08 DIAGNOSIS — R7881 Bacteremia: Secondary | ICD-10-CM | POA: Diagnosis not present

## 2020-07-08 DIAGNOSIS — D84821 Immunodeficiency due to drugs: Secondary | ICD-10-CM

## 2020-07-08 LAB — GLUCOSE, CAPILLARY
Glucose-Capillary: 89 mg/dL (ref 70–99)
Glucose-Capillary: 92 mg/dL (ref 70–99)
Glucose-Capillary: 120 mg/dL — ABNORMAL HIGH (ref 70–99)
Glucose-Capillary: 132 mg/dL — ABNORMAL HIGH (ref 70–99)
Glucose-Capillary: 118 mg/dL — ABNORMAL HIGH (ref 70–99)

## 2020-07-08 LAB — CBC WITH DIFFERENTIAL/PLATELET
Abs Immature Granulocytes: 0.14 10*3/uL — ABNORMAL HIGH (ref 0.00–0.07)
Basophils Absolute: 0 10*3/uL (ref 0.0–0.1)
Basophils Relative: 1 %
Eosinophils Absolute: 0 10*3/uL (ref 0.0–0.5)
Eosinophils Relative: 0 %
HCT: 25.2 % — ABNORMAL LOW (ref 36.0–46.0)
Hemoglobin: 8.4 g/dL — ABNORMAL LOW (ref 12.0–15.0)
Immature Granulocytes: 9 %
Lymphocytes Relative: 56 %
Lymphs Abs: 0.9 10*3/uL (ref 0.7–4.0)
MCH: 29.2 pg (ref 26.0–34.0)
MCHC: 33.3 g/dL (ref 30.0–36.0)
MCV: 87.5 fL (ref 80.0–100.0)
Monocytes Absolute: 0.1 10*3/uL (ref 0.1–1.0)
Monocytes Relative: 7 %
Neutro Abs: 0.4 10*3/uL — CL (ref 1.7–7.7)
Neutrophils Relative %: 27 %
Platelets: 8 10*3/uL — CL (ref 150–400)
RBC: 2.88 MIL/uL — ABNORMAL LOW (ref 3.87–5.11)
RDW: 15.9 % — ABNORMAL HIGH (ref 11.5–15.5)
WBC: 1.6 10*3/uL — ABNORMAL LOW (ref 4.0–10.5)
nRBC: 0 % (ref 0.0–0.2)

## 2020-07-08 LAB — BASIC METABOLIC PANEL
Anion gap: 8 (ref 5–15)
BUN: 28 mg/dL — ABNORMAL HIGH (ref 6–20)
CO2: 21 mmol/L — ABNORMAL LOW (ref 22–32)
Calcium: 7.2 mg/dL — ABNORMAL LOW (ref 8.9–10.3)
Chloride: 113 mmol/L — ABNORMAL HIGH (ref 98–111)
Creatinine, Ser: 1.91 mg/dL — ABNORMAL HIGH (ref 0.44–1.00)
GFR, Estimated: 30 mL/min — ABNORMAL LOW (ref >60)
Glucose, Bld: 92 mg/dL (ref 70–99)
Potassium: 3.2 mmol/L — ABNORMAL LOW (ref 3.5–5.1)
Sodium: 142 mmol/L (ref 135–145)

## 2020-07-08 LAB — PREPARE PLATELET PHERESIS: Unit division: 0

## 2020-07-08 LAB — MAGNESIUM: Magnesium: 1 mg/dL — ABNORMAL LOW (ref 1.7–2.4)

## 2020-07-08 LAB — BPAM PLATELET PHERESIS
Blood Product Expiration Date: 202204172359
ISSUE DATE / TIME: 202204171005
Unit Type and Rh: 6200

## 2020-07-08 MED ORDER — CEFAZOLIN SODIUM-DEXTROSE 2-4 GM/100ML-% IV SOLN
2.0000 g | Freq: Three times a day (TID) | INTRAVENOUS | Status: AC
  Administered 2020-07-08 – 2020-07-14 (×19): 2 g via INTRAVENOUS
  Filled 2020-07-08 (×22): qty 100

## 2020-07-08 MED ORDER — POTASSIUM CHLORIDE CRYS ER 20 MEQ PO TBCR
40.0000 meq | EXTENDED_RELEASE_TABLET | Freq: Once | ORAL | Status: AC
  Administered 2020-07-08: 40 meq via ORAL
  Filled 2020-07-08: qty 2

## 2020-07-08 MED ORDER — MAGNESIUM SULFATE 2 GM/50ML IV SOLN
2.0000 g | INTRAVENOUS | Status: AC
  Administered 2020-07-08 (×2): 2 g via INTRAVENOUS
  Filled 2020-07-08 (×2): qty 50

## 2020-07-08 MED ORDER — SODIUM CHLORIDE 0.9% IV SOLUTION: Freq: Once | INTRAVENOUS | Status: AC

## 2020-07-08 MED ORDER — FUROSEMIDE 10 MG/ML IJ SOLN
20.0000 mg | Freq: Once | INTRAMUSCULAR | Status: AC
  Administered 2020-07-08: 20 mg via INTRAVENOUS
  Filled 2020-07-08: qty 2

## 2020-07-08 MED ORDER — ACETAMINOPHEN 325 MG PO TABS
650.0000 mg | ORAL_TABLET | Freq: Four times a day (QID) | ORAL | Status: DC | PRN
  Administered 2020-07-08 – 2020-07-19 (×7): 650 mg via ORAL
  Filled 2020-07-08 (×7): qty 2

## 2020-07-08 MED ORDER — FENTANYL CITRATE (PF) 100 MCG/2ML IJ SOLN: 12.5000 ug | INTRAMUSCULAR | Status: DC | PRN

## 2020-07-08 NOTE — Progress Notes (Signed)
PHARMACY NOTE:  ANTIMICROBIAL RENAL DOSAGE ADJUSTMENT  Current antimicrobial regimen includes a mismatch between antimicrobial dosage and estimated renal function.  As per policy approved by the Pharmacy & Therapeutics and Medical Executive Committees, the antimicrobial dosage will be adjusted accordingly.  Current antimicrobial dosage:  Cefazolin 1g IV q12h  Indication: Bacteremia  Renal Function:  Estimated Creatinine Clearance: 30.4 mL/min (A) (by C-G formula based on SCr of 1.91 mg/dL (H)). []      On intermittent HD, scheduled: []      On CRRT    Antimicrobial dosage has been changed to:  2g IV q8h  Thank you for allowing pharmacy to be a part of this patient's care.  Peggyann Juba, PharmD, BCPS Pharmacy: [phone] 07/08/2020 7:42 AM

## 2020-07-08 NOTE — Plan of Care (Signed)

## 2020-07-08 NOTE — Progress Notes (Signed)
[facility] for Infectious Disease  Date of Admission:  07/01/2020           Reason for visit: Follow up on coag negative staph bacteremia  Current antibiotics: Cefazolin 4/16--present  Previous antibiotics: Vancomycin 4/11--4/15 Cefepime x1 4/11   ASSESSMENT:    1. Coagulase-negative staph bacteremia: 1 set of admission blood cultures positive in both aerobic and anaerobic bottles.  Methicillin susceptible and transitioned to cefazolin over the weekend.  Unclear if this is a true pathogen versus contaminant, however, phlebotomy unable to obtain further sets of blood cultures due to difficult venous access further confounding the issue.  Given her immunosuppression due to chemotherapy, retained chemotherapy port, and clinical uncertainty we have opted to give her course of antibiotics. 2. Acute kidney injury: Creatinine continues to improve 3. Pancytopenia 4. Stage IV lung cancer 5. GI bleed: Secondary to diffuse mucosal bleeding  PLAN:    . Continue cefazolin per pharmacy guidance based on renal function . Recommend 14 days of total therapy with end date of 07/15/2020 . Will sign off for now, please call if any further questions arise   Principal Problem:   Bacteremia associated with intravascular line ([place]) Active Problems:   Malignant neoplasm of right lung (HCC)   Hypokalemia   Acute on chronic anemia   Gastrointestinal hemorrhage   Pancytopenia (HCC)   Renal failure   AKI (acute kidney injury) ([place])   Acute blood loss anemia    MEDICATIONS:    Scheduled Meds: . sodium chloride   Intravenous Once  . amLODipine  5 mg Oral Daily  . Chlorhexidine Gluconate Cloth  6 each Topical Daily  . insulin aspart  0-9 Units Subcutaneous Q4H  . mouth rinse  15 mL Mouth Rinse BID  . pantoprazole (PROTONIX) IV  40 mg Intravenous Q24H  . sodium chloride flush  10-40 mL Intracatheter Q12H  . Tbo-filgastrim (GRANIX) SQ  300 mcg Subcutaneous q1800   Continuous  Infusions: .  ceFAZolin (ANCEF) IV Stopped (07/08/20 0934)  . lactated ringers 125 mL/hr at 07/08/20 0942  . magnesium sulfate bolus IVPB Stopped (07/08/20 0850)   PRN Meds:.docusate sodium, Gerhardt's butt cream, hydrALAZINE, ondansetron (ZOFRAN) IV, sodium chloride flush  SUBJECTIVE:   24 hour events:  No acute events noted overnight Afebrile Creatinine improving WBC, ANC improving No new cultures Chest x-ray yesterday reviewed shows bilateral pleural effusions  No acute complaints this morning.  She is about to eat breakfast this morning.  No complaints of nausea, vomiting, further bleeding.  No fevers.  Mild swelling of right wrist.     OBJECTIVE:   Blood pressure 118/63, pulse (!) 108, temperature 98.4 F (36.9 C), temperature source Oral, resp. rate (!) 26, height 5\' 6"  (1.676 m), weight 66.5 kg, last menstrual period 01/19/2011, SpO2 98 %. Body mass index is 23.66 kg/m.  Physical Exam Constitutional:      General: She is not in acute distress.    Appearance: Normal appearance.  HENT:     Head: Normocephalic and atraumatic.  Eyes:     Extraocular Movements: Extraocular movements intact.     Conjunctiva/sclera: Conjunctivae normal.  Pulmonary:     Effort: Pulmonary effort is normal. No respiratory distress.  Abdominal:     General: There is no distension.     Palpations: Abdomen is soft.     Tenderness: There is no abdominal tenderness.  Skin:    Comments: Mild erythema and swelling of right wrist consistent with phlebitis  Neurological:  General: No focal deficit present.     Mental Status: She is alert and oriented to person, place, and time.  Psychiatric:        Mood and Affect: Mood normal.        Behavior: Behavior normal.      Lab Results: Lab Results  Component Value Date   WBC 1.6 (L) 07/08/2020   HGB 8.4 (L) 07/08/2020   HCT 25.2 (L) 07/08/2020   MCV 87.5 07/08/2020   PLT 8 (LL) 07/08/2020    Lab Results  Component Value Date   NA 142  07/08/2020   K 3.2 (L) 07/08/2020   CO2 21 (L) 07/08/2020   GLUCOSE 92 07/08/2020   BUN 28 (H) 07/08/2020   CREATININE 1.91 (H) 07/08/2020   CALCIUM 7.2 (L) 07/08/2020   GFRNONAA 30 (L) 07/08/2020   GFRAA  07/01/2020    QUESTIONABLE RESULTS, RECOMMEND RECOLLECT TO VERIFY    Lab Results  Component Value Date   ALT 28 07/04/2020   AST 39 07/04/2020   ALKPHOS 53 07/04/2020   BILITOT 0.7 07/04/2020    No results found for: CRP  No results found for: ESRSEDRATE   I have reviewed the micro and lab results in Epic.  Imaging: DG CHEST PORT 1 VIEW  Result Date: 07/07/2020 CLINICAL DATA:  History of lung carcinoma with weakness EXAM: PORTABLE CHEST 1 VIEW COMPARISON:  07/01/2020 FINDINGS: Cardiac shadow is stable. Right-sided chest wall port is again noted and stable. New bilateral pleural effusions are noted left greater than right with likely underlying atelectasis. No acute bony abnormality is seen. IMPRESSION: New bilateral pleural effusions left greater than right. Electronically Signed   By: Inez Catalina M.D.   On: 07/07/2020 20:13     Imaging  independently reviewed in Epic.    [facility]ious [facility] [phone] pager 07/08/2020, 10:25 AM

## 2020-07-08 NOTE — Progress Notes (Signed)
Physical Therapy Treatment Patient Details Name: Brenda [name] MRN: [id number] DOB: Dec 31, 1960 Today's Date: 07/08/2020    History of Present Illness 60 years old female adm with Bacteremia associated with intravascular line, ARF, acute GI bleed, pancytopenia.   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 Julien Nordmann presented to the hospital with epistaxis, dark stools and generalized weakness for few days.  CXR showed Port-A-Cath in place and minimal right pleural effusion but otherwise clear.    PT Comments    Max encouragement needed for participation. Pt exhibits weakness, decreased activity tolerance. She c/o dizziness during session. Pt remained on [place] O2 during session. Unsure if pt will be able to manage at home based on pt performance last 2 sessions. D/C recommendation has been updated to SNF. Will continue to follow.     Follow Up Recommendations  SNF;Supervision/Assistance - 24 hour (HHPT is pt declines placement)     Equipment Recommendations  Rolling walker with 5" wheels    Recommendations for Other Services       Precautions / Restrictions Precautions Precautions: Fall Precaution Comments: monitor O2. lightheadedness Restrictions Weight Bearing Restrictions: No    Mobility  Bed Mobility Overal bed mobility: Needs Assistance Bed Mobility: Supine to Sit     Supine to sit: Min assist     General bed mobility comments: light assist to elevate trunk, incr time needed. Pt c/o dizziness while sitting EOB.    Transfers Overall transfer level: Needs assistance Equipment used: 1 person hand held assist Transfers: Sit to/from Stand Sit to Stand: Min assist         General transfer comment: A to power up, steady, control descent. Cues for safety  Ambulation/Gait Ambulation/Gait assistance: Min assist; +2 for safety/equipment   Assistive device: 2 person hand held  assist       General Gait Details: Pt took several steps along the side of the bed with 2 HHA. Marland Kitchen Pt is unsteady, weak, and dyspneic with minimal activity.   Stairs             Wheelchair Mobility    Modified Rankin (Stroke Patients Only)       Balance Overall balance assessment: Needs assistance         Standing balance support: Bilateral upper extremity supported Standing balance-Leahy Scale: Poor                              Cognition Arousal/Alertness: Awake/alert Behavior During Therapy: WFL for tasks assessed/performed Overall Cognitive Status: Within Functional Limits for tasks assessed                                        Exercises      General Comments        Pertinent Vitals/Pain Pain Assessment: No/denies pain    Home Living                      Prior Function            PT Goals (current goals can now be found in the care plan section) Progress towards PT goals: Progressing toward goals    Frequency    Min 3X/week      PT Plan Current plan remains appropriate    Co-evaluation  AM-PAC PT "6 Clicks" Mobility   Outcome Measure  Help needed turning from your back to your side while in a flat bed without using bedrails?: A Little Help needed moving from lying on your back to sitting on the side of a flat bed without using bedrails?: A Little Help needed moving to and from a bed to a chair (including a wheelchair)?: A Little Help needed standing up from a chair using your arms (e.g., wheelchair or bedside chair)?: A Little Help needed to walk in hospital room?: A Lot Help needed climbing 3-5 steps with a railing? : A Lot 6 Click Score: 16    End of Session   Activity Tolerance: Patient limited by fatigue Patient left: in bed;with call bell/phone within reach;with bed alarm set   PT Visit Diagnosis: Muscle weakness (generalized) (M62.81);Difficulty in walking, not  elsewhere classified (R26.2)     Time: 3094-0768 PT Time Calculation (min) (ACUTE ONLY): 14 min  Charges:  $Gait Training: 8-22 mins                         Doreatha Massed, PT Acute Rehabilitation  Office: [phone] Pager: [phone]

## 2020-07-08 NOTE — TOC Progression Note (Signed)
Transition of Care [facility]) - Progression Note    Patient Details  Name: Brenda [name] MRN: [id number] Date of Birth: 08-Jan-1961  Transition of Care [facility]) CM/SW Contact  Leeroy Cha, RN Phone Number: 07/08/2020, 7:52 AM  Clinical Narrative:    60 years old female with past medical history of stage IV non-small cell lung cancer- adenocarcinoma of right lung, depression undergoing chemotherapy with Dr. Julien Nordmann presented tothehospital with epistaxis,dark stools and generalized weakness for few days. On initial presentation,blood pressure was stable but patientwas mildly hypothermicCXRshowedPort-A-Cath in place and minimal right pleural effusion but otherwise clear. Her lactic acid was >11, with INR 1.4 andhad AKI withcreatinine over 5 without hyperkalemia and metabolic acidosis. Patient received PRBC in the ED and was more somnolent and had focal seizure-like activity. She was given Ativan and Keppra andhead CT was done. Bear hugger was initiated and the patient was admitted to the hospital. Hemoglobin was around 2.4 subsequently and was given packed RBC. Of note patient was initiated on chemotherapy on 09/28/2019 for 11 cycles and current regimen of with carboplatin for an AUC of 5, Alimta 500 mg/m2, and Keytruda 200 mg IV every 3 weeks. Alimtawasheld due to renal insufficiency and neuropenia.  During hospitalization, patient was seen by GI and oncology. Patient has been having pancytopenia with ongoing bleeding and conservative management has been pursued at this time plan home with self care does live alone,contact is a friend.   Expected Discharge Plan: [place] Barriers to Discharge: Continued Medical Work up  Expected Discharge Plan and Services Expected Discharge Plan: [place]   Discharge Planning Services: CM Consult   Living arrangements for the past 2 months: Single Family Home                                        Social Determinants of Health (SDOH) Interventions    Readmission Risk Interventions Readmission Risk Prevention Plan 04/11/2020  Transportation Screening Complete  PCP or Specialist Appt within 3-5 Days Complete  HRI or [place] Complete  Social Work Consult for [place] Planning/Counseling Complete  Palliative Care Screening Not Applicable  Medication Review [employer] photographer) Complete  Some recent data might be hidden

## 2020-07-08 NOTE — Progress Notes (Signed)
PROGRESS NOTE    Brenda [name]  OQH:[id number] DOB: [birth year] DOA: 07/01/2020 PCP: Pcp, No   Brief Narrative:  Brenda [name] is a 60 years old female with past medical history of stage IV non-small cell lung cancer- adenocarcinoma of right lung, depression undergoing chemotherapy with Dr. Julien Nordmann presented tothehospital with epistaxis,dark stools and generalized weakness for few days. On initial presentation,blood pressure was stable but patientwas mildly hypothermicCXRshowedPort-A-Cath in place and minimal right pleural effusion but otherwise clear. Her lactic acid was >11, with INR 1.4 andhad AKI withcreatinine over 5 without hyperkalemia and metabolic acidosis. Patient received PRBC in the ED and was more somnolent and had focal seizure-like activity. She was given Ativan and Keppra andhead CT was done. Bear hugger was initiated and the patient was admitted to the hospital. Hemoglobin was around 2.4 subsequently and was given packed RBC. Of note patient was initiated on chemotherapy on 09/28/2019 for 11 cycles and current regimen of with carboplatin for an AUC of 5, Alimta 500 mg/m2, and Keytruda 200 mg IV every 3 weeks. Alimtawasheld due to renal insufficiency and neuropenia.  During hospitalization, patient was seen by GI and oncology. Patient has been having pancytopenia with ongoing bleeding and conservative management has been pursued at this time.   Assessment & Plan:   Principal Problem:   Bacteremia associated with intravascular line ([place]) Active Problems:   Malignant neoplasm of right lung (HCC)   Hypokalemia   Acute on chronic anemia   Gastrointestinal hemorrhage   Pancytopenia (HCC)   Renal failure   AKI (acute kidney injury) ([place])   Acute blood loss anemia  Pancytopenia Likely secondary to pemetrexed toxicity. Status post multiple units of packed RBC and platelet transfusion.On Granix as per oncology.Oncology recommends Granix daily until [facility]  is above 1.5. Willcontinue totransfuse for hemoglobin less than 8 and platelet count less than 20,000 or active bleeding.. Plt 9-transfuse 1 unit, Hgb 8.4 -   Acute renal failure: Likely secondary tochemotherapy and severe anemia with possible volume depletion. Received several liters of IV fluidduring hospitalization.. Renal ultrasound was within normal limits. Currently on maintenancefluids with Ringer lactate at 125 mL/h. Continue to monitor. Creatinine is trending down.   Acute GI bleed. Patient had RBC scan showing mild acute GI bleeding in the small bowel. Unlikely to yield muchfrom angiogram/ embolization as per GI.Marland Kitchen Likely mucosal bleeding and GIrecommended platelet and RBC transfusion as above. Seen by GI during hospitalization. Continue Protonix IV decreased by GE to once daily  4/18, plan to convert to p.o. tomorrow.Transfuse as necessary.  RUE exttemity phlebitis: -Improved considerably from yesterday -mildly inflammed at site of "needle stick" per pt -distal by wrist, neurovascularly intact -Warm compresses elevate right upper extremity  Stage IV lung cancer:Adenocarcinomaby biopsy. Status post 2 cycles of pemetrexed and pembrolizumabmoncology on board. Transfuse if hemoglobin less than 8as per oncology.  Seizure:  Unclear of etiology no new seizure. Possibility of hypoxia. CT scan without any acute findings.   Lactic acidosis: Chest x-ray was clear. UA unremarkable. Resolved improved tissue perfusion. No source of infection. Latest lactate of 1.0.  Significant hypokalemia.Replenished IV KCl. Potassium of 3.2today. Repleting,Check levels in am.  Hypomagnesemia: repleted and recheck in AM  Coagulase-negative staph bacteremia.Only 1 set of blood culture was obtained. Both aerobic and anaerobic bottles were positive. Received vancomycin. Check levels and dose according to ID  Transfer out of ICU to floor today  DVT  prophylaxis: SCD/Compression stockings  Code Status: Full    Code Status Orders  (From admission, onward)  Start     Ordered   07/01/20 1433  Full code  Continuous        07/01/20 1433        Code Status History    Date Active Date Inactive Code Status Order ID Comments User Context   04/07/2020 2042 04/11/2020 1909 Full Code [id number]  Rhetta Mura, DO ED   04/03/2019 1507 04/05/2019 2210 Full Code [id number]  Lyndee Hensen, DO ED   05/30/2017 1558 06/03/2017 0006 Full Code [id number]  Patrecia Pour, NP Inpatient   05/29/2017 1508 05/30/2017 1525 Full Code [id number]  Isla Pence, MD ED   12/24/2011 0140 12/25/2011 2040 Full Code [id number]  Truddie Hidden., MD ED   Advance Care Planning Activity     Family Communication: Discussed with the patient's contact Sunday no new information today Status is: Inpatient  Remains inpatient appropriate because:IV treatments appropriate due to intensity of illness or inability to take PO and Inpatient level of care appropriate due to severity of illnessongoing GI bleed, severe pancytopenia, concern for bacteremia on IV antibiotics  Dispo: The patient is from: Home Anticipated d/c is to: [place],will get PT evaluation.Continue to monitor closely.Will need to follow GI oncology recommendations. Patient currently is not medically stable to d/c. Difficult to place patient No  Consultants:  PCCM  Infectious disease  Medical oncology  GI    Procedures:  CT Head Wo Contrast  Result Date: 07/01/2020 CLINICAL DATA:  Recent seizure activity EXAM: CT HEAD WITHOUT CONTRAST TECHNIQUE: Contiguous axial images were obtained from the base of the skull through the vertex without intravenous contrast. COMPARISON:  04/07/2020 FINDINGS: Brain: Mild atrophic changes are noted. No findings to suggest acute hemorrhage, acute infarction or space-occupying mass lesion noted. Vascular: No  hyperdense vessel or unexpected calcification. Skull: Normal. Negative for fracture or focal lesion. Sinuses/Orbits: No acute finding. Chronic soft tissue changes are noted in the left ethmoid sinuses. Other: None. IMPRESSION: Mild atrophic changes without acute abnormality. Electronically Signed   By: Inez Catalina M.D.   On: 07/01/2020 15:05   NM GI Blood Loss  Result Date: 07/02/2020 CLINICAL DATA:  Acute lower GI bleeding, 3 episodes of large volume hematochezia. History of lung cancer EXAM: NUCLEAR MEDICINE GASTROINTESTINAL BLEEDING SCAN TECHNIQUE: Sequential abdominal images were obtained following intravenous administration of Tc-61m labeled red blood cells. RADIOPHARMACEUTICALS:  22.7 mCi Tc-10m pertechnetate in-vitro labeled red cells. COMPARISON:  05/21/2020 CT with contrast FINDINGS: Abnormal activity within the mid and lower abdomen extending into the upper pelvis and peristalsing throughout loops of bowel compatible with mild acute GI bleeding. Based on the pattern of peristalsis, it is difficult to determine location but small bowel is favored over colon. IMPRESSION: Positive exam for mild acute GI bleeding as above. These results will be called to the ordering clinician or representative by the Radiologist Assistant, and communication documented in the PACS or [employer]. Electronically Signed   By: Jerilynn Mages.  Shick M.D.   On: 07/02/2020 12:08   US RENAL  Result Date: 07/01/2020 CLINICAL DATA:  Acute kidney injury. EXAM: RENAL / URINARY TRACT ULTRASOUND COMPLETE COMPARISON:  CT abdomen pelvis dated May 21, 2020. FINDINGS: Right Kidney: Renal measurements: 10.0 x 4.2 x 5.0 cm = volume: 109 mL. Echogenicity within normal limits. No mass or hydronephrosis visualized. Left Kidney: Renal measurements: 9.1 x 5.1 x 5.3 cm = volume: 128 mL. Echogenicity within normal limits. No mass or hydronephrosis visualized. Other: Bilateral pleural effusions. IMPRESSION: 1. Normal renal ultrasound. 2. Bilateral  pleural effusions.  Electronically Signed   By: Titus Dubin M.D.   On: 07/01/2020 15:55   DG CHEST PORT 1 VIEW  Result Date: 07/07/2020 CLINICAL DATA:  History of lung carcinoma with weakness EXAM: PORTABLE CHEST 1 VIEW COMPARISON:  07/01/2020 FINDINGS: Cardiac shadow is stable. Right-sided chest wall port is again noted and stable. New bilateral pleural effusions are noted left greater than right with likely underlying atelectasis. No acute bony abnormality is seen. IMPRESSION: New bilateral pleural effusions left greater than right. Electronically Signed   By: Inez Catalina M.D.   On: 07/07/2020 20:13   DG Chest Port 1 View  Result Date: 07/01/2020 CLINICAL DATA:  Shortness of breath EXAM: PORTABLE CHEST 1 VIEW COMPARISON:  April 04, 2019 FINDINGS: Port-A-Cath tip is in the superior vena cava. No pneumothorax. There is a rather minimal right pleural effusion. No edema or airspace opacity. Heart size and pulmonary vascularity are normal. No adenopathy. No bone lesions. IMPRESSION: Port-A-Cath as noted. Other minimal right pleural effusion. Lungs otherwise clear. Cardiac silhouette normal. Electronically Signed   By: Lowella Grip III M.D.   On: 07/01/2020 11:44     Antimicrobials:  Current antibiotics: Cefazolin 4/16--present  Previous antibiotics: Vancomycin 4/11--4/15 Cefepime x1 4/11   Subjective: Patient resting in bed comfortably. Reports breathing is improved Blood pressure improved Right upper extremity phlebitis significantly improved  Objective: Vitals:   07/08/20 1000 07/08/20 1025 07/08/20 1100 07/08/20 1150  BP: 118/63 126/62 106/64 116/66  Pulse:  (!) 108  (!) 104  Resp: (!) 23 (!) 26 (!) 30 (!) 28  Temp:  98.4 F (36.9 C)  97.9 F (36.6 C)  TempSrc:  Oral  Oral  SpO2:  98% 98% 100%  Weight:      Height:        Intake/Output Summary (Last 24 hours) at 07/08/2020 1240 Last data filed at 07/08/2020 1150 Gross per 24 hour  Intake 3184.42 ml  Output 4300  ml  Net -1115.58 ml   Filed Weights   07/04/20 0500 07/05/20 0500 07/08/20 0500  Weight: 60 kg 61 kg 66.5 kg    Examination:  General exam: Appears calm and comfortable chronically ill-appearing Respiratory system: Mild rales at bilateral bases, good respiratory effort good air movement Cardiovascular system: S1 & S2 heard, RRR. No JVD, murmurs, rubs, gallops or clicks. No pedal edema. Gastrointestinal system: Abdomen is nondistended, soft and nontender. No organomegaly or masses felt. Normal bowel sounds heard. Central nervous system: Alert and oriented. No focal neurological deficits. Extremities: Symmetric 5 x 5 power.  Warm well perfused findings as noted below Skin: Mild erythema and swelling at site of needlestick on right wrist, improved from prior evaluation.    As previously noted, consistent with a phlebitis.  Good pulses no upper extremity swelling Psychiatry: Judgement and insight appear normal. Mood & affect appropriate.    Data Reviewed: I have personally reviewed following labs and imaging studies  CBC: Recent Labs  Lab 07/02/20 2100 07/03/20 0621 07/04/20 0000 07/04/20 0538 07/05/20 0620 07/05/20 1615 07/06/20 0500 07/07/20 0500 07/07/20 2002 07/08/20 0458  WBC 0.4*   < > 0.5*   < > 0.6* 0.7* 0.9* 1.2*  --  1.6*  NEUTROABS 0.0*  --  0.1*  --   --   --  0.3* 0.6*  --  0.4*  HGB 4.8*   < > 8.9*   < > 7.8* 8.1* 8.0* 7.8* 8.5* 8.4*  HCT 13.7*   < > 25.7*   < > 22.8* 24.4* 23.3*  23.9* 25.8* 25.2*  MCV 91.9   < > 87.7   < > 87.4 89.1 89.3 92.3  --  87.5  PLT 68*   < > 38*   < > 9* 35* 24* 15*  --  8*   < > = values in this interval not displayed.   Basic Metabolic Panel: Recent Labs  Lab 07/02/20 0308 07/02/20 0930 07/03/20 0621 07/04/20 0538 07/05/20 0620 07/06/20 0500 07/07/20 0500 07/08/20 0458  NA 133*   < > 137 139 139 140 139 142  K 3.1*   < > 2.7* 3.8 3.3* 3.6 3.3* 3.2*  CL 105   < > 111 115* 115* 115* 113* 113*  CO2 14*   < > 15* 16* 17* 17*  20* 21*  GLUCOSE 105*   < > 101* 93 113* 98 92 92  BUN 94*   < > 89* 83* 67* 48* 36* 28*  CREATININE 4.59*   < > 3.97* 3.27* 2.79* 2.25* 1.99* 1.91*  CALCIUM 7.0*   < > 6.8* 7.1* 7.3* 7.4* 7.3* 7.2*  MG 1.8  --  1.8 1.8 1.6* 1.4*  --  1.0*  PHOS 8.9*  --   --  4.9* 4.1  --   --   --    < > = values in this interval not displayed.   GFR: Estimated Creatinine Clearance: 30.4 mL/min (A) (by C-G formula based on SCr of 1.91 mg/dL (H)). Liver Function Tests: Recent Labs  Lab 07/01/20 1320 07/04/20 0538  AST 68* 39  ALT 32 28  ALKPHOS 62 53  BILITOT 0.5 0.7  PROT 4.4* 4.1*  ALBUMIN 1.4* 1.4*   Recent Labs  Lab 07/01/20 1320  LIPASE 55*   No results for input(s): AMMONIA in the last 168 hours. Coagulation Profile: Recent Labs  Lab 07/02/20 0452  INR 1.3*   Cardiac Enzymes: No results for input(s): CKTOTAL, CKMB, CKMBINDEX, TROPONINI in the last 168 hours. BNP (last 3 results) No results for input(s): PROBNP in the last 8760 hours. HbA1C: No results for input(s): HGBA1C in the last 72 hours. CBG: Recent Labs  Lab 07/07/20 2004 07/07/20 2341 07/08/20 0439 07/08/20 0743 07/08/20 1132  GLUCAP 122* 90 89 92 120*   Lipid Profile: No results for input(s): CHOL, HDL, LDLCALC, TRIG, CHOLHDL, LDLDIRECT in the last 72 hours. Thyroid Function Tests: No results for input(s): TSH, T4TOTAL, FREET4, T3FREE, THYROIDAB in the last 72 hours. Anemia Panel: No results for input(s): VITAMINB12, FOLATE, FERRITIN, TIBC, IRON, RETICCTPCT in the last 72 hours. Sepsis Labs: Recent Labs  Lab 07/01/20 1416 07/02/20 0308  LATICACIDVEN >11.0* 1.0    Recent Results (from the past 240 hour(s))  Blood culture (routine single)     Status: Abnormal   Collection Time: 07/01/20 11:53 AM   Specimen: BLOOD  Result Value Ref Range Status   Specimen Description   Final    BLOOD LEFT ANTECUBITAL Performed at [facility] [address], [place], [place] [address]     Special Requests   Final    BOTTLES DRAWN AEROBIC AND ANAEROBIC Blood Culture adequate volume Performed at [facility] [address] [place], [place] [address]    Culture  Setup Time   Final    GRAM POSITIVE COCCI IN CLUSTERS IN BOTH AEROBIC AND ANAEROBIC BOTTLES CRITICAL RESULT CALLED TO, READ BACK BY AND VERIFIED WITH: Oto AT [place] ON 07/02/20 Performed at [facility], [place],  [place] [address]    Culture STAPHYLOCOCCUS HOMINIS (A)  Final   Report Status 07/06/2020 FINAL  Final   Organism ID, Bacteria STAPHYLOCOCCUS HOMINIS  Final      Susceptibility   Staphylococcus hominis - MIC*    CIPROFLOXACIN <=0.5 SENSITIVE Sensitive     ERYTHROMYCIN >=8 RESISTANT Resistant     GENTAMICIN <=0.5 SENSITIVE Sensitive     OXACILLIN <=0.25 SENSITIVE Sensitive     TETRACYCLINE <=1 SENSITIVE Sensitive     VANCOMYCIN <=0.5 SENSITIVE Sensitive     TRIMETH/SULFA 20 SENSITIVE Sensitive     CLINDAMYCIN <=0.25 SENSITIVE Sensitive     RIFAMPIN <=0.5 SENSITIVE Sensitive     Inducible Clindamycin NEGATIVE Sensitive     * STAPHYLOCOCCUS HOMINIS  Blood Culture ID Panel (Reflexed)     Status: Abnormal   Collection Time: 07/01/20 11:53 AM  Result Value Ref Range Status   Enterococcus faecalis NOT DETECTED NOT DETECTED Final   Enterococcus Faecium NOT DETECTED NOT DETECTED Final   Listeria monocytogenes NOT DETECTED NOT DETECTED Final   Staphylococcus species DETECTED (A) NOT DETECTED Final    Comment: CRITICAL RESULT CALLED TO, READ BACK BY AND VERIFIED WITH: PHARMD JUSTIN LEGGE BY KJ AT 1012 ON [id number]    Staphylococcus aureus (BCID) NOT DETECTED NOT DETECTED Final   Staphylococcus epidermidis NOT DETECTED NOT DETECTED Final   Staphylococcus lugdunensis NOT DETECTED NOT DETECTED Final   Streptococcus species NOT DETECTED NOT DETECTED Final   Streptococcus agalactiae NOT DETECTED NOT DETECTED Final   Streptococcus pneumoniae NOT DETECTED  NOT DETECTED Final   Streptococcus pyogenes NOT DETECTED NOT DETECTED Final   A.calcoaceticus-baumannii NOT DETECTED NOT DETECTED Final   Bacteroides fragilis NOT DETECTED NOT DETECTED Final   Enterobacterales NOT DETECTED NOT DETECTED Final   Enterobacter cloacae complex NOT DETECTED NOT DETECTED Final   Escherichia coli NOT DETECTED NOT DETECTED Final   Klebsiella aerogenes NOT DETECTED NOT DETECTED Final   Klebsiella oxytoca NOT DETECTED NOT DETECTED Final   Klebsiella pneumoniae NOT DETECTED NOT DETECTED Final   Proteus species NOT DETECTED NOT DETECTED Final   Salmonella species NOT DETECTED NOT DETECTED Final   Serratia marcescens NOT DETECTED NOT DETECTED Final   Haemophilus influenzae NOT DETECTED NOT DETECTED Final   Neisseria meningitidis NOT DETECTED NOT DETECTED Final   Pseudomonas aeruginosa NOT DETECTED NOT DETECTED Final   Stenotrophomonas maltophilia NOT DETECTED NOT DETECTED Final   Candida albicans NOT DETECTED NOT DETECTED Final   Candida auris NOT DETECTED NOT DETECTED Final   Candida glabrata NOT DETECTED NOT DETECTED Final   Candida krusei NOT DETECTED NOT DETECTED Final   Candida parapsilosis NOT DETECTED NOT DETECTED Final   Candida tropicalis NOT DETECTED NOT DETECTED Final   Cryptococcus neoformans/gattii NOT DETECTED NOT DETECTED Final    Comment: Performed at [facility], [place]. [address], [place], [place] [address]  Resp Panel by RT-PCR (Flu A&B, Covid) Nasopharyngeal Swab     Status: None   Collection Time: 07/01/20  2:05 PM   Specimen: Nasopharyngeal Swab; Nasopharyngeal(NP) swabs in vial transport medium  Result Value Ref Range Status   SARS Coronavirus 2 by RT PCR NEGATIVE NEGATIVE Final    Comment: (NOTE) SARS-CoV-2 target nucleic acids are NOT DETECTED.  The SARS-CoV-2 RNA is generally detectable in upper respiratory specimens during the acute phase of infection. The lowest concentration of SARS-CoV-2 viral copies this assay can detect  is 138 copies/mL. A negative result does not preclude SARS-Cov-2 infection and should not be used as  the sole basis for treatment or other patient management decisions. A negative result may occur with  improper specimen collection/handling, submission of specimen other than nasopharyngeal swab, presence of viral mutation(s) within the areas targeted by this assay, and inadequate number of viral copies(<138 copies/mL). A negative result must be combined with clinical observations, patient history, and epidemiological information. The expected result is Negative.  Fact Sheet for Patients:  EntrepreneurPulse.com.au  Fact Sheet for Healthcare Providers:  IncredibleEmployment.be  This test is no t yet approved or cleared by the [place] FDA and  has been authorized for detection and/or diagnosis of SARS-CoV-2 by FDA under an Emergency Use Authorization (EUA). This EUA will remain  in effect (meaning this test can be used) for the duration of the COVID-19 declaration under Section 564(b)(1) of the Act, 21 U.S.C.section 360bbb-3(b)(1), unless the authorization is terminated  or revoked sooner.       Influenza A by PCR NEGATIVE NEGATIVE Final   Influenza B by PCR NEGATIVE NEGATIVE Final    Comment: (NOTE) The Xpert Xpress SARS-CoV-2/FLU/RSV plus assay is intended as an aid in the diagnosis of influenza from Nasopharyngeal swab specimens and should not be used as a sole basis for treatment. Nasal washings and aspirates are unacceptable for Xpert Xpress SARS-CoV-2/FLU/RSV testing.  Fact Sheet for Patients: EntrepreneurPulse.com.au  Fact Sheet for Healthcare Providers: IncredibleEmployment.be  This test is not yet approved or cleared by the [place] FDA and has been authorized for detection and/or diagnosis of SARS-CoV-2 by FDA under an Emergency Use Authorization (EUA). This EUA will remain in effect  (meaning this test can be used) for the duration of the COVID-19 declaration under Section 564(b)(1) of the Act, 21 U.S.C. section 360bbb-3(b)(1), unless the authorization is terminated or revoked.  Performed at [facility], [place] [employer] [address] [place], [place] [address]   Urine culture     Status: None   Collection Time: 07/01/20  2:16 PM   Specimen: In/Out Cath Urine  Result Value Ref Range Status   Specimen Description   Final    IN/OUT CATH URINE Performed at [facility] [address], [place], [place] [address]    Special Requests   Final    NONE Performed at [facility], [place] [address], [place], [place] [address]    Culture   Final    NO GROWTH Performed at [facility], [place] [address], [place], [place] [address]    Report Status 07/04/2020 FINAL  Final  MRSA PCR Screening     Status: Abnormal   Collection Time: 07/01/20  4:57 PM   Specimen: Nasopharyngeal  Result Value Ref Range Status   MRSA by PCR POSITIVE (A) NEGATIVE Final    Comment:        The GeneXpert MRSA Assay (FDA approved for NASAL specimens only), is one component of a comprehensive MRSA colonization surveillance program. It is not intended to diagnose MRSA infection nor to guide or monitor treatment for MRSA infections. RESULT CALLED TO, READ BACK BY AND VERIFIED WITH: BARKSDALE,T. RN @1932  ON 04.11.2022 BY COHEN,K Performed at [facility], [place] [address] [place], [place] [address]          Radiology Studies: DG CHEST PORT 1 VIEW  Result Date: 07/07/2020 CLINICAL DATA:  History of lung carcinoma with weakness EXAM: PORTABLE CHEST 1 VIEW COMPARISON:  07/01/2020 FINDINGS: Cardiac shadow is stable. Right-sided chest wall port is again noted and stable. New bilateral pleural effusions  are noted left greater than right with likely underlying atelectasis. No acute bony abnormality is seen. IMPRESSION: New bilateral  pleural effusions left greater than right. Electronically Signed   By: Inez Catalina M.D.   On: 07/07/2020 20:13        Scheduled Meds: . sodium chloride   Intravenous Once  . amLODipine  5 mg Oral Daily  . Chlorhexidine Gluconate Cloth  6 each Topical Daily  . insulin aspart  0-9 Units Subcutaneous Q4H  . mouth rinse  15 mL Mouth Rinse BID  . pantoprazole (PROTONIX) IV  40 mg Intravenous Q24H  . sodium chloride flush  10-40 mL Intracatheter Q12H  . Tbo-filgastrim (GRANIX) SQ  300 mcg Subcutaneous q1800   Continuous Infusions: .  ceFAZolin (ANCEF) IV Stopped (07/08/20 0934)  . lactated ringers 125 mL/hr at 07/08/20 0942  . magnesium sulfate bolus IVPB 2 g (07/08/20 1154)     LOS: 7 days    Time spent: 35 minutes    Nicolette Bang, MD [facility]  If 7PM-7AM, please contact night-coverage  07/08/2020, 12:40 PM

## 2020-07-09 DIAGNOSIS — R7881 Bacteremia: Secondary | ICD-10-CM | POA: Diagnosis not present

## 2020-07-09 DIAGNOSIS — T827XXD Infection and inflammatory reaction due to other cardiac and vascular devices, implants and grafts, subsequent encounter: Secondary | ICD-10-CM | POA: Diagnosis not present

## 2020-07-09 LAB — GLUCOSE, CAPILLARY
Glucose-Capillary: 100 mg/dL — ABNORMAL HIGH (ref 70–99)
Glucose-Capillary: 107 mg/dL — ABNORMAL HIGH (ref 70–99)
Glucose-Capillary: 107 mg/dL — ABNORMAL HIGH (ref 70–99)
Glucose-Capillary: 111 mg/dL — ABNORMAL HIGH (ref 70–99)
Glucose-Capillary: 113 mg/dL — ABNORMAL HIGH (ref 70–99)

## 2020-07-09 LAB — PREPARE PLATELET PHERESIS: Unit division: 0

## 2020-07-09 LAB — CBC WITH DIFFERENTIAL/PLATELET
Abs Immature Granulocytes: 0.03 10*3/uL (ref 0.00–0.07)
Basophils Absolute: 0 10*3/uL (ref 0.0–0.1)
Basophils Relative: 0 %
Eosinophils Absolute: 0 10*3/uL (ref 0.0–0.5)
Eosinophils Relative: 0 %
HCT: 25.1 % — ABNORMAL LOW (ref 36.0–46.0)
Hemoglobin: 8.3 g/dL — ABNORMAL LOW (ref 12.0–15.0)
Immature Granulocytes: 2 %
Lymphocytes Relative: 60 %
Lymphs Abs: 0.9 10*3/uL (ref 0.7–4.0)
MCH: 29 pg (ref 26.0–34.0)
MCHC: 33.1 g/dL (ref 30.0–36.0)
MCV: 87.8 fL (ref 80.0–100.0)
Monocytes Absolute: 0.1 10*3/uL (ref 0.1–1.0)
Monocytes Relative: 4 %
Neutro Abs: 0.5 10*3/uL — ABNORMAL LOW (ref 1.7–7.7)
Neutrophils Relative %: 34 %
Platelets: 5 10*3/uL — CL (ref 150–400)
RBC: 2.86 MIL/uL — ABNORMAL LOW (ref 3.87–5.11)
RDW: 15.8 % — ABNORMAL HIGH (ref 11.5–15.5)
WBC: 1.4 10*3/uL — CL (ref 4.0–10.5)
nRBC: 0 % (ref 0.0–0.2)

## 2020-07-09 LAB — CBC
HCT: 24.1 % — ABNORMAL LOW (ref 36.0–46.0)
Hemoglobin: 8.1 g/dL — ABNORMAL LOW (ref 12.0–15.0)
MCH: 29.6 pg (ref 26.0–34.0)
MCHC: 33.6 g/dL (ref 30.0–36.0)
MCV: 88 fL (ref 80.0–100.0)
Platelets: 5 10*3/uL — CL (ref 150–400)
RBC: 2.74 MIL/uL — ABNORMAL LOW (ref 3.87–5.11)
RDW: 15.8 % — ABNORMAL HIGH (ref 11.5–15.5)
WBC: 1.1 10*3/uL — CL (ref 4.0–10.5)
nRBC: 0 % (ref 0.0–0.2)

## 2020-07-09 LAB — BASIC METABOLIC PANEL
Anion gap: 7 (ref 5–15)
BUN: 23 mg/dL — ABNORMAL HIGH (ref 6–20)
CO2: 23 mmol/L (ref 22–32)
Calcium: 7.3 mg/dL — ABNORMAL LOW (ref 8.9–10.3)
Chloride: 111 mmol/L (ref 98–111)
Creatinine, Ser: 1.96 mg/dL — ABNORMAL HIGH (ref 0.44–1.00)
GFR, Estimated: 29 mL/min — ABNORMAL LOW (ref >60)
Glucose, Bld: 103 mg/dL — ABNORMAL HIGH (ref 70–99)
Potassium: 3 mmol/L — ABNORMAL LOW (ref 3.5–5.1)
Sodium: 141 mmol/L (ref 135–145)

## 2020-07-09 LAB — MAGNESIUM: Magnesium: 1.7 mg/dL (ref 1.7–2.4)

## 2020-07-09 LAB — BPAM PLATELET PHERESIS
Blood Product Expiration Date: 202204212359
ISSUE DATE / TIME: 202204181005
Unit Type and Rh: 5100

## 2020-07-09 MED ORDER — SODIUM CHLORIDE 0.9% IV SOLUTION: Freq: Once | INTRAVENOUS | Status: DC

## 2020-07-09 MED ORDER — SODIUM CHLORIDE 0.9% IV SOLUTION: Freq: Once | INTRAVENOUS | Status: AC

## 2020-07-09 MED ORDER — FOLIC ACID 1 MG PO TABS
1.0000 mg | ORAL_TABLET | Freq: Every day | ORAL | Status: DC
  Administered 2020-07-09 – 2020-08-02 (×24): 1 mg via ORAL
  Filled 2020-07-09 (×24): qty 1

## 2020-07-09 MED ORDER — FERROUS SULFATE 325 (65 FE) MG PO TABS
325.0000 mg | ORAL_TABLET | Freq: Two times a day (BID) | ORAL | Status: DC
  Administered 2020-07-10 – 2020-08-02 (×47): 325 mg via ORAL
  Filled 2020-07-09 (×49): qty 1

## 2020-07-09 MED ORDER — INSULIN ASPART 100 UNIT/ML ~~LOC~~ SOLN
0.0000 [IU] | Freq: Three times a day (TID) | SUBCUTANEOUS | Status: DC
Start: 2020-07-09 — End: 2020-07-25
  Administered 2020-07-11 – 2020-07-16 (×4): 1 [IU] via SUBCUTANEOUS
  Administered 2020-07-16 (×2): 2 [IU] via SUBCUTANEOUS
  Administered 2020-07-17: 1 [IU] via SUBCUTANEOUS
  Administered 2020-07-20: 2 [IU] via SUBCUTANEOUS
  Administered 2020-07-22 – 2020-07-24 (×3): 1 [IU] via SUBCUTANEOUS

## 2020-07-09 MED ORDER — IPRATROPIUM-ALBUTEROL 0.5-2.5 (3) MG/3ML IN SOLN: 3.0000 mL | RESPIRATORY_TRACT | Status: DC | PRN

## 2020-07-09 MED ORDER — POTASSIUM CHLORIDE CRYS ER 20 MEQ PO TBCR
40.0000 meq | EXTENDED_RELEASE_TABLET | Freq: Once | ORAL | Status: AC
  Administered 2020-07-09: 40 meq via ORAL
  Filled 2020-07-09: qty 2

## 2020-07-09 NOTE — Progress Notes (Signed)
Brenda [name],son called. Updated provided. Made patient aware. Will continue to monitor.

## 2020-07-09 NOTE — Progress Notes (Signed)
PT Cancellation Note  Patient Details Name: Brenda [name] MRN: [id number] DOB: Dec 18, 1960   Cancelled Treatment:    Reason Eval/Treat Not Completed: Medical issues which prohibited therapy--low platelets-platelet transfusion today. Will hold PT today and check back another day.    [facility]  Office: [phone] Pager: [phone]

## 2020-07-09 NOTE — Progress Notes (Signed)
Received critical WBC and platelet values.  WBC 1.4 and platelets 5, notified on call provider of results.  Awaiting response

## 2020-07-09 NOTE — Progress Notes (Signed)
Platelets to receive 2 units of platelets, platelets had to be ordered, just received call that platelets are now available order placed for IV team to disconnect patient from IVF (has right chest port)

## 2020-07-09 NOTE — Plan of Care (Signed)

## 2020-07-09 NOTE — TOC Progression Note (Signed)
Transition of Care [facility]) - Progression Note    Patient Details  Name: Alissandra Geoffroy MRN: [id number] Date of Birth: 19-Dec-1960  Transition of Care [facility]) CM/SW Contact  Purcell Mouton, RN Phone Number: 07/09/2020, 3:06 PM  Clinical Narrative:    Pt transferred from ICU. Spoke with pt concerning discharge plans . Pt decline going to a SNF. Pt states that she will go back to [facility]. Pt asked for a 4 wheel walker with a seat. Pt will benefit with going to [facility], related to pt declining to go to a SNF. There is a 2 year wait for [facility].    Expected Discharge Plan: [place] Barriers to Discharge: Continued Medical Work up  Expected Discharge Plan and Services Expected Discharge Plan: [place]   Discharge Planning Services: CM Consult   Living arrangements for the past 2 months: Single Family Home                                       Social Determinants of Health (SDOH) Interventions    Readmission Risk Interventions Readmission Risk Prevention Plan 04/11/2020  Transportation Screening Complete  PCP or Specialist Appt within 3-5 Days Complete  HRI or [place] Complete  Social Work Consult for [place] Planning/Counseling Complete  Palliative Care Screening Not Applicable  Medication Review Press photographer) Complete  Some recent data might be hidden

## 2020-07-09 NOTE — Progress Notes (Signed)
                                                                  Shift Summary:      07/09/20 1738  Vitals  Temp 100 F (37.8 C)  Temp Source Oral  BP 138/77  BP Location Right Arm  BP Method Automatic  Patient Position (if appropriate) Lying  Pulse Rate (!) 104  ECG Heart Rate (!) 104  Resp 16  Level of Consciousness  Level of Consciousness Alert  MEWS COLOR  MEWS Score Color Green  Oxygen Therapy  SpO2 98 %  O2 Device Nasal Cannula  O2 Flow Rate (L/min) 4 L/min  Pain Assessment  Pain Scale 0-10  Pain Score 0  Remained alert and oriented x 4. Received 2 units of platelets during this shift. Patient temperature low grade at 99.4. Thermostat in the room on 90 degrees farenheit. Asked patient if it was okay to decrease thermostat due to temperature rising. Patient endorses it is okay. Rechecked temperature after second unit of platelets completed. Low grad temperature at 100. Made MD Choi aware, rechecked temperature within 20 minutes, temperature went down to 97.6. Patient also noted to be wheezing with increased shortness of breath during bath and linen change. Increased oxygen to 4L. Patient endorses some relief. Made MD Choi aware and inquired about breathing treatments. PRN breathing treatments ordered by MD no other needs identified. Will continue to monitor.

## 2020-07-09 NOTE — Progress Notes (Signed)
PROGRESS NOTE    Brenda [name]  WIO:[id number] DOB: February 09, 1961 DOA: 07/01/2020 PCP: Pcp, No     Brief Narrative:  Brenda [name] is a 60 years old female with past medical history of stage IV non-small cell lung cancer- adenocarcinoma of right lung, undergoing chemotherapy with Dr. Julien Nordmann, who presented tothehospital with epistaxis,dark stools and generalized weakness for few days. On initial presentation,blood pressure was stable but patientwas mildly hypothermic.CXRshowedPort-A-Cath in place and minimal right pleural effusion but otherwise clear. Her lactic acid was >11, with INR 1.4 andhad AKI withcreatinine over 5 without hyperkalemia and metabolic acidosis. Patient received PRBC in the ED and was more somnolent and had focal seizure-like activity. She was given Ativan and Keppra andhead CT was done. Bear hugger was initiated and the patient was admitted to the hospital. Hemoglobin was around 2.4 subsequently and was given packed RBC. Of note patient was initiated on chemotherapy on 09/28/2019 for 11 cycles and current regimen of with carboplatin for an AUC of 5, Alimta 500 mg/m2, and Keytruda 200 mg IV every 3 weeks. Alimtawasheld due to renal insufficiency and neuropenia.  During hospitalization, patient was seen by GI and oncology. Patient has been having pancytopenia with ongoing bleeding and conservative management has been pursued at this time.  New events last 24 hours / Subjective: Patient admits to some epistaxis, but no other blood loss source including dark or bloody stools, vaginal bleeding or hematemesis.  She admits to fatigue, but no other complaints this morning.  Assessment & Plan:   Principal Problem:   Bacteremia associated with intravascular line ([place]) Active Problems:   Malignant neoplasm of right lung (HCC)   Hypokalemia   Acute on chronic anemia   Gastrointestinal hemorrhage   Pancytopenia (HCC)   Renal failure   AKI (acute kidney injury)  ([place])   Acute blood loss anemia   Pancytopenia -Likely secondary to pemetrexed toxicity. Status post multiple units of packed RBC and platelet transfusion. -On Granix as per oncology.Oncology recommends Granix daily until [facility] is above 1.5.  -Willcontinue totransfuse for hemoglobin less than 8 and platelet count less than 20,000 or active bleeding. -Transfusing platelets today  AKI -Likely secondary tochemotherapy and severe anemia with possible volume depletion. Received several liters of IV fluidduring hospitalization. Renal ultrasound was within normal limits. -Baseline creatinine 1-1.4 -Slowly improving -Continue IV fluid  Acute GI bleed -Patient had RBC scan showing mild acute GI bleeding in the small bowel. Unlikely to yield muchfrom angiogram/embolization as per GI.Marland Kitchen Likely mucosal bleeding and GIrecommended platelet and RBC transfusionas above.  -Continue Protonix   RUE extremity phlebitis -Improved  Stage IV lung cancer -Adenocarcinomaby biopsy. Status post 2 cycles of pemetrexed and pembrolizumabm. Oncology on board  Seizure -Unclear of etiology, possibility of hypoxia. CT scan without any acute findings.   Lactic acidosis -Chest x-ray was clear. UA unremarkable. Resolved improved tissue perfusion. No source of infection. Resolved  Coagulase-negative staph bacteremia -Only 1 set of blood culture was obtained. Both aerobic and anaerobic bottles were positive. Received vancomycin. Per ID continue cefazolin through 4/25  Hypertension -Continue Norvasc  Hypokalemia -Replace, trend    DVT prophylaxis:  SCDs Start: 07/01/20 1431  Code Status: Full code Family Communication: No family at bedside Disposition Plan:  Status is: Inpatient  Remains inpatient appropriate because:IV treatments appropriate due to intensity of illness or inability to take PO and Inpatient level of care appropriate due to severity of illness   Dispo:  The patient is from: Home  Anticipated d/c is to: SNF              Patient currently is not medically stable to d/c.  Platelet transfusions ordered today   Difficult to place patient No   Antimicrobials:  Anti-infectives (From admission, onward)   Start     Dose/Rate Route Frequency Ordered Stop   07/08/20 1000  ceFAZolin (ANCEF) IVPB 2g/100 mL premix        2 g 200 mL/hr over 30 Minutes Intravenous Every 8 hours 07/08/20 0744 07/14/20 2359   07/06/20 2200  ceFAZolin (ANCEF) IVPB 1 g/50 mL premix  Status:  Discontinued        1 g 100 mL/hr over 30 Minutes Intravenous Every 12 hours 07/06/20 1058 07/08/20 0744   07/05/20 0600  vancomycin (VANCOREADY) IVPB 750 mg/150 mL        750 mg 150 mL/hr over 60 Minutes Intravenous  Once 07/04/20 1351 07/05/20 0712   07/03/20 1030  vancomycin (VANCOREADY) IVPB 750 mg/150 mL        750 mg 150 mL/hr over 60 Minutes Intravenous  Once 07/03/20 0942 07/03/20 1155   07/01/20 1145  ceFEPIme (MAXIPIME) 2 g in sodium chloride 0.9 % 100 mL IVPB        2 g 200 mL/hr over 30 Minutes Intravenous  Once 07/01/20 1140 07/01/20 1243   07/01/20 1145  vancomycin (VANCOCIN) IVPB 1000 mg/200 mL premix        1,000 mg 200 mL/hr over 60 Minutes Intravenous  Once 07/01/20 1140 07/01/20 1359        Objective: Vitals:   07/09/20 0440 07/09/20 0858 07/09/20 1220 07/09/20 1258  BP: 108/65 106/61 135/81 132/80  Pulse: (!) 106  100 100  Resp: 20     Temp: 98.6 F (37 C)  98.6 F (37 C) 98.6 F (37 C)  TempSrc: Oral  Oral Oral  SpO2: 93% 98% 98% 98%  Weight: 69.6 kg     Height:        Intake/Output Summary (Last 24 hours) at 07/09/2020 1326 Last data filed at 07/09/2020 1154 Gross per 24 hour  Intake 1363.59 ml  Output 1100 ml  Net 263.59 ml   Filed Weights   07/05/20 0500 07/08/20 0500 07/09/20 0440  Weight: 61 kg 66.5 kg 69.6 kg    Examination:  General exam: Appears calm and comfortable, fatigued Respiratory system: Clear to  auscultation. Respiratory effort normal. No respiratory distress. No conversational dyspnea.  On nasal cannula O2 Cardiovascular system: S1 & S2 heard, irregular rhythm, PACs on telemetry. No murmurs. No pedal edema. Gastrointestinal system: Abdomen is nondistended, soft and nontender. Normal bowel sounds heard. Central nervous system: Alert and oriented. No focal neurological deficits. Speech clear.  Extremities: Symmetric in appearance  Skin: No rashes, lesions or ulcers on exposed skin  Psychiatry: Judgement and insight appear normal. Mood & affect appropriate.   Data Reviewed: I have personally reviewed following labs and imaging studies  CBC: Recent Labs  Lab 07/04/20 0000 07/04/20 0538 07/05/20 1615 07/06/20 0500 07/07/20 0500 07/07/20 2002 07/08/20 0458 07/09/20 0449  WBC 0.5*   < > 0.7* 0.9* 1.2*  --  1.6* 1.4*  NEUTROABS 0.1*  --   --  0.3* 0.6*  --  0.4* 0.5*  HGB 8.9*   < > 8.1* 8.0* 7.8* 8.5* 8.4* 8.3*  HCT 25.7*   < > 24.4* 23.3* 23.9* 25.8* 25.2* 25.1*  MCV 87.7   < > 89.1 89.3 92.3  --  87.5 87.8  PLT 38*   < > 35* 24* 15*  --  8* 5*   < > = values in this interval not displayed.   Basic Metabolic Panel: Recent Labs  Lab 07/04/20 0538 07/05/20 0620 07/06/20 0500 07/07/20 0500 07/08/20 0458 07/09/20 0449  NA 139 139 140 139 142 141  K 3.8 3.3* 3.6 3.3* 3.2* 3.0*  CL 115* 115* 115* 113* 113* 111  CO2 16* 17* 17* 20* 21* 23  GLUCOSE 93 113* 98 92 92 103*  BUN 83* 67* 48* 36* 28* 23*  CREATININE 3.27* 2.79* 2.25* 1.99* 1.91* 1.96*  CALCIUM 7.1* 7.3* 7.4* 7.3* 7.2* 7.3*  MG 1.8 1.6* 1.4*  --  1.0* 1.7  PHOS 4.9* 4.1  --   --   --   --    GFR: Estimated Creatinine Clearance: 29.6 mL/min (A) (by C-G formula based on SCr of 1.96 mg/dL (H)). Liver Function Tests: Recent Labs  Lab 07/04/20 0538  AST 39  ALT 28  ALKPHOS 53  BILITOT 0.7  PROT 4.1*  ALBUMIN 1.4*   No results for input(s): LIPASE, AMYLASE in the last 168 hours. No results for input(s):  AMMONIA in the last 168 hours. Coagulation Profile: No results for input(s): INR, PROTIME in the last 168 hours. Cardiac Enzymes: No results for input(s): CKTOTAL, CKMB, CKMBINDEX, TROPONINI in the last 168 hours. BNP (last 3 results) No results for input(s): PROBNP in the last 8760 hours. HbA1C: No results for input(s): HGBA1C in the last 72 hours. CBG: Recent Labs  Lab 07/08/20 2002 07/09/20 0014 07/09/20 0431 07/09/20 0755 07/09/20 1255  GLUCAP 118* 100* 107* 111* 113*   Lipid Profile: No results for input(s): CHOL, HDL, LDLCALC, TRIG, CHOLHDL, LDLDIRECT in the last 72 hours. Thyroid Function Tests: No results for input(s): TSH, T4TOTAL, FREET4, T3FREE, THYROIDAB in the last 72 hours. Anemia Panel: No results for input(s): VITAMINB12, FOLATE, FERRITIN, TIBC, IRON, RETICCTPCT in the last 72 hours. Sepsis Labs: No results for input(s): PROCALCITON, LATICACIDVEN in the last 168 hours.  Recent Results (from the past 240 hour(s))  Blood culture (routine single)     Status: Abnormal   Collection Time: 07/01/20 11:53 AM   Specimen: BLOOD  Result Value Ref Range Status   Specimen Description   Final    BLOOD LEFT ANTECUBITAL Performed at [facility] [address] [place], [place] [address]    Special Requests   Final    BOTTLES DRAWN AEROBIC AND ANAEROBIC Blood Culture adequate volume Performed at [facility] [address] [place], [place] [address]    Culture  Setup Time   Final    GRAM POSITIVE COCCI IN CLUSTERS IN BOTH AEROBIC AND ANAEROBIC BOTTLES CRITICAL RESULT CALLED TO, READ BACK BY AND VERIFIED WITH: [place] AT [place] ON 07/02/20 Performed at [facility], [place] [address], [place], [place] [address]    Culture STAPHYLOCOCCUS HOMINIS (A)  Final   Report Status 07/06/2020 FINAL  Final   Organism ID, Bacteria STAPHYLOCOCCUS HOMINIS  Final      Susceptibility   Staphylococcus hominis - MIC*     CIPROFLOXACIN <=0.5 SENSITIVE Sensitive     ERYTHROMYCIN >=8 RESISTANT Resistant     GENTAMICIN <=0.5 SENSITIVE Sensitive     OXACILLIN <=0.25 SENSITIVE Sensitive     TETRACYCLINE <=1 SENSITIVE Sensitive     VANCOMYCIN <=0.5 SENSITIVE Sensitive     TRIMETH/SULFA 20 SENSITIVE Sensitive     CLINDAMYCIN <=0.25 SENSITIVE Sensitive  RIFAMPIN <=0.5 SENSITIVE Sensitive     Inducible Clindamycin NEGATIVE Sensitive     * STAPHYLOCOCCUS HOMINIS  Blood Culture ID Panel (Reflexed)     Status: Abnormal   Collection Time: 07/01/20 11:53 AM  Result Value Ref Range Status   Enterococcus faecalis NOT DETECTED NOT DETECTED Final   Enterococcus Faecium NOT DETECTED NOT DETECTED Final   Listeria monocytogenes NOT DETECTED NOT DETECTED Final   Staphylococcus species DETECTED (A) NOT DETECTED Final    Comment: CRITICAL RESULT CALLED TO, READ BACK BY AND VERIFIED WITH: PHARMD JUSTIN LEGGE BY [place] AT 1012 ON [id number]    Staphylococcus aureus (BCID) NOT DETECTED NOT DETECTED Final   Staphylococcus epidermidis NOT DETECTED NOT DETECTED Final   Staphylococcus lugdunensis NOT DETECTED NOT DETECTED Final   Streptococcus species NOT DETECTED NOT DETECTED Final   Streptococcus agalactiae NOT DETECTED NOT DETECTED Final   Streptococcus pneumoniae NOT DETECTED NOT DETECTED Final   Streptococcus pyogenes NOT DETECTED NOT DETECTED Final   A.calcoaceticus-baumannii NOT DETECTED NOT DETECTED Final   Bacteroides fragilis NOT DETECTED NOT DETECTED Final   Enterobacterales NOT DETECTED NOT DETECTED Final   Enterobacter cloacae complex NOT DETECTED NOT DETECTED Final   Escherichia coli NOT DETECTED NOT DETECTED Final   Klebsiella aerogenes NOT DETECTED NOT DETECTED Final   Klebsiella oxytoca NOT DETECTED NOT DETECTED Final   Klebsiella pneumoniae NOT DETECTED NOT DETECTED Final   Proteus species NOT DETECTED NOT DETECTED Final   Salmonella species NOT DETECTED NOT DETECTED Final   Serratia marcescens NOT DETECTED NOT  DETECTED Final   Haemophilus influenzae NOT DETECTED NOT DETECTED Final   Neisseria meningitidis NOT DETECTED NOT DETECTED Final   Pseudomonas aeruginosa NOT DETECTED NOT DETECTED Final   Stenotrophomonas maltophilia NOT DETECTED NOT DETECTED Final   Candida albicans NOT DETECTED NOT DETECTED Final   Candida auris NOT DETECTED NOT DETECTED Final   Candida glabrata NOT DETECTED NOT DETECTED Final   Candida krusei NOT DETECTED NOT DETECTED Final   Candida parapsilosis NOT DETECTED NOT DETECTED Final   Candida tropicalis NOT DETECTED NOT DETECTED Final   Cryptococcus neoformans/gattii NOT DETECTED NOT DETECTED Final    Comment: Performed at [facility], [address] [place], [place] [address]  Resp Panel by RT-PCR (Flu A&B, Covid) Nasopharyngeal Swab     Status: None   Collection Time: 07/01/20  2:05 PM   Specimen: Nasopharyngeal Swab; Nasopharyngeal(NP) swabs in vial transport medium  Result Value Ref Range Status   SARS Coronavirus 2 by RT PCR NEGATIVE NEGATIVE Final    Comment: (NOTE) SARS-CoV-2 target nucleic acids are NOT DETECTED.  The SARS-CoV-2 RNA is generally detectable in upper respiratory specimens during the acute phase of infection. The lowest concentration of SARS-CoV-2 viral copies this assay can detect is 138 copies/mL. A negative result does not preclude SARS-Cov-2 infection and should not be used as the sole basis for treatment or other patient management decisions. A negative result may occur with  improper specimen collection/handling, submission of specimen other than nasopharyngeal swab, presence of viral mutation(s) within the areas targeted by this assay, and inadequate number of viral copies(<138 copies/mL). A negative result must be combined with clinical observations, patient history, and epidemiological information. The expected result is Negative.  Fact Sheet for Patients:  EntrepreneurPulse.com.au  Fact Sheet for Healthcare  Providers:  IncredibleEmployment.be  This test is no t yet approved or cleared by the [place] FDA and  has been authorized for detection and/or diagnosis of SARS-CoV-2 by FDA  under an Emergency Use Authorization (EUA). This EUA will remain  in effect (meaning this test can be used) for the duration of the COVID-19 declaration under Section 564(b)(1) of the Act, 21 U.S.C.section 360bbb-3(b)(1), unless the authorization is terminated  or revoked sooner.       Influenza A by PCR NEGATIVE NEGATIVE Final   Influenza B by PCR NEGATIVE NEGATIVE Final    Comment: (NOTE) The Xpert Xpress SARS-CoV-2/FLU/RSV plus assay is intended as an aid in the diagnosis of influenza from Nasopharyngeal swab specimens and should not be used as a sole basis for treatment. Nasal washings and aspirates are unacceptable for Xpert Xpress SARS-CoV-2/FLU/RSV testing.  Fact Sheet for Patients: EntrepreneurPulse.com.au  Fact Sheet for Healthcare Providers: IncredibleEmployment.be  This test is not yet approved or cleared by the [place] FDA and has been authorized for detection and/or diagnosis of SARS-CoV-2 by FDA under an Emergency Use Authorization (EUA). This EUA will remain in effect (meaning this test can be used) for the duration of the COVID-19 declaration under Section 564(b)(1) of the Act, 21 U.S.C. section 360bbb-3(b)(1), unless the authorization is terminated or revoked.  Performed at [facility], [place] [address], [place], [place] [address]   Urine culture     Status: None   Collection Time: 07/01/20  2:16 PM   Specimen: In/Out Cath Urine  Result Value Ref Range Status   Specimen Description   Final    IN/OUT CATH URINE Performed at [facility] [address], [place], [place] [address]    Special Requests   Final    NONE Performed at [facility], [place] [address] [place], [place] [address]    Culture   Final    NO GROWTH Performed at [facility], [place] [address] [place], [place] [address]    Report Status 07/04/2020 FINAL  Final  MRSA PCR Screening     Status: Abnormal   Collection Time: 07/01/20  4:57 PM   Specimen: Nasopharyngeal  Result Value Ref Range Status   MRSA by PCR POSITIVE (A) NEGATIVE Final    Comment:        The GeneXpert MRSA Assay (FDA approved for NASAL specimens only), is one component of a comprehensive MRSA colonization surveillance program. It is not intended to diagnose MRSA infection nor to guide or monitor treatment for MRSA infections. RESULT CALLED TO, READ BACK BY AND VERIFIED WITH: BARKSDALE,T. RN @1932  ON 04.11.2022 BY COHEN,K Performed at [facility], [place] [address], [place], [place] [address]       Radiology Studies: DG CHEST PORT 1 VIEW  Result Date: 07/07/2020 CLINICAL DATA:  History of lung carcinoma with weakness EXAM: PORTABLE CHEST 1 VIEW COMPARISON:  07/01/2020 FINDINGS: Cardiac shadow is stable. Right-sided chest wall port is again noted and stable. New bilateral pleural effusions are noted left greater than right with likely underlying atelectasis. No acute bony abnormality is seen. IMPRESSION: New bilateral pleural effusions left greater than right. Electronically Signed   By: Inez Catalina M.D.   On: 07/07/2020 20:13      Scheduled Meds: . sodium chloride   Intravenous Once  . sodium chloride   Intravenous Once  . amLODipine  5 mg Oral Daily  . Chlorhexidine Gluconate Cloth  6 each Topical Daily  . ferrous sulfate  325 mg Oral BID WC  . folic acid  1 mg Oral Daily  . insulin aspart  0-9 Units Subcutaneous Q4H  . mouth  rinse  15 mL Mouth Rinse BID  . pantoprazole (PROTONIX) IV  40 mg Intravenous Q24H  . sodium chloride flush  10-40 mL Intracatheter Q12H  . Tbo-filgastrim (GRANIX) SQ  300 mcg Subcutaneous q1800   Continuous Infusions: .  ceFAZolin (ANCEF) IV 2  g (07/09/20 1039)  . lactated ringers 125 mL/hr at 07/09/20 0910     LOS: 8 days      Time spent: 25 minutes   Dessa Phi, DO [facility] 07/09/2020, 1:26 PM   Available via Epic secure chat 7am-7pm After these hours, please refer to coverage provider listed on [email or web address]

## 2020-07-09 NOTE — Progress Notes (Signed)
Patient has orders for 2 units of platelets to be transfused. Patient has an implanted chest port. Called IV team for further assistance, and place IV team consult.

## 2020-07-10 ENCOUNTER — Inpatient Hospital Stay: Payer: Medicaid Other

## 2020-07-10 ENCOUNTER — Telehealth: Payer: Self-pay | Admitting: Internal Medicine

## 2020-07-10 ENCOUNTER — Inpatient Hospital Stay: Payer: Medicaid Other | Admitting: Physician Assistant

## 2020-07-10 DIAGNOSIS — R7881 Bacteremia: Secondary | ICD-10-CM | POA: Diagnosis not present

## 2020-07-10 DIAGNOSIS — T827XXD Infection and inflammatory reaction due to other cardiac and vascular devices, implants and grafts, subsequent encounter: Secondary | ICD-10-CM | POA: Diagnosis not present

## 2020-07-10 LAB — PREPARE PLATELET PHERESIS
Unit division: 0
Unit division: 0

## 2020-07-10 LAB — CBC WITH DIFFERENTIAL/PLATELET
Abs Immature Granulocytes: 0.02 10*3/uL (ref 0.00–0.07)
Basophils Absolute: 0 10*3/uL (ref 0.0–0.1)
Basophils Relative: 0 %
Eosinophils Absolute: 0 10*3/uL (ref 0.0–0.5)
Eosinophils Relative: 0 %
HCT: 23.4 % — ABNORMAL LOW (ref 36.0–46.0)
Hemoglobin: 7.7 g/dL — ABNORMAL LOW (ref 12.0–15.0)
Immature Granulocytes: 2 %
Lymphocytes Relative: 45 %
Lymphs Abs: 0.6 10*3/uL — ABNORMAL LOW (ref 0.7–4.0)
MCH: 29.1 pg (ref 26.0–34.0)
MCHC: 32.9 g/dL (ref 30.0–36.0)
MCV: 88.3 fL (ref 80.0–100.0)
Monocytes Absolute: 0.1 10*3/uL (ref 0.1–1.0)
Monocytes Relative: 10 %
Neutro Abs: 0.5 10*3/uL — ABNORMAL LOW (ref 1.7–7.7)
Neutrophils Relative %: 43 %
Platelets: <5 10*3/uL — CL (ref 150–400)
RBC: 2.65 MIL/uL — ABNORMAL LOW (ref 3.87–5.11)
RDW: 15.7 % — ABNORMAL HIGH (ref 11.5–15.5)
WBC: 1.3 10*3/uL — CL (ref 4.0–10.5)
nRBC: 0 % (ref 0.0–0.2)

## 2020-07-10 LAB — BASIC METABOLIC PANEL
Anion gap: 6 (ref 5–15)
BUN: 18 mg/dL (ref 6–20)
CO2: 24 mmol/L (ref 22–32)
Calcium: 7.3 mg/dL — ABNORMAL LOW (ref 8.9–10.3)
Chloride: 111 mmol/L (ref 98–111)
Creatinine, Ser: 1.96 mg/dL — ABNORMAL HIGH (ref 0.44–1.00)
GFR, Estimated: 29 mL/min — ABNORMAL LOW (ref >60)
Glucose, Bld: 83 mg/dL (ref 70–99)
Potassium: 2.9 mmol/L — ABNORMAL LOW (ref 3.5–5.1)
Sodium: 141 mmol/L (ref 135–145)

## 2020-07-10 LAB — GLUCOSE, CAPILLARY
Glucose-Capillary: 91 mg/dL (ref 70–99)
Glucose-Capillary: 85 mg/dL (ref 70–99)
Glucose-Capillary: 103 mg/dL — ABNORMAL HIGH (ref 70–99)
Glucose-Capillary: 83 mg/dL (ref 70–99)

## 2020-07-10 LAB — BPAM PLATELET PHERESIS
Blood Product Expiration Date: 202204202359
Blood Product Expiration Date: 202204212359
ISSUE DATE / TIME: 202204191235
ISSUE DATE / TIME: 202204191555
Unit Type and Rh: 5100
Unit Type and Rh: 7300

## 2020-07-10 LAB — PREPARE RBC (CROSSMATCH)

## 2020-07-10 MED ORDER — MAGNESIUM SULFATE 2 GM/50ML IV SOLN
2.0000 g | Freq: Once | INTRAVENOUS | Status: AC
  Administered 2020-07-10: 2 g via INTRAVENOUS
  Filled 2020-07-10: qty 50

## 2020-07-10 MED ORDER — HYDROCORTISONE 1 % EX LOTN
TOPICAL_LOTION | Freq: Two times a day (BID) | CUTANEOUS | Status: DC | PRN
  Filled 2020-07-10: qty 118

## 2020-07-10 MED ORDER — SODIUM CHLORIDE 0.9% IV SOLUTION: Freq: Once | INTRAVENOUS | Status: AC

## 2020-07-10 MED ORDER — POTASSIUM CHLORIDE CRYS ER 20 MEQ PO TBCR
40.0000 meq | EXTENDED_RELEASE_TABLET | ORAL | Status: AC
  Administered 2020-07-10 (×2): 40 meq via ORAL
  Filled 2020-07-10 (×2): qty 2

## 2020-07-10 MED ORDER — DIPHENHYDRAMINE HCL 25 MG PO CAPS
25.0000 mg | ORAL_CAPSULE | Freq: Four times a day (QID) | ORAL | Status: DC | PRN
  Administered 2020-07-14 – 2020-07-24 (×5): 25 mg via ORAL
  Filled 2020-07-10 (×5): qty 1

## 2020-07-10 NOTE — Progress Notes (Signed)
PROGRESS NOTE    Brenda [name]  GBT:[id number] DOB: 01-10-1961 DOA: 07/01/2020 PCP: Pcp, No     Brief Narrative:  Brenda [name] is a 60 years old female with past medical history of stage IV non-small cell lung cancer- adenocarcinoma of right lung, undergoing chemotherapy with Dr. Julien Nordmann, who presented tothehospital with epistaxis,dark stools and generalized weakness for few days. On initial presentation,blood pressure was stable but patientwas mildly hypothermic.CXRshowedPort-A-Cath in place and minimal right pleural effusion but otherwise clear. Her lactic acid was >11, with INR 1.4 andhad AKI withcreatinine over 5 without hyperkalemia and metabolic acidosis. Patient received PRBC in the ED and was more somnolent and had focal seizure-like activity. She was given Ativan and Keppra andhead CT was done. Bear hugger was initiated and the patient was admitted to the hospital. Hemoglobin was around 2.4 subsequently and was given packed RBC. Of note patient was initiated on chemotherapy on 09/28/2019 for 11 cycles and current regimen of with carboplatin for an AUC of 5, Alimta 500 mg/m2, and Keytruda 200 mg IV every 3 weeks. Alimtawasheld due to renal insufficiency and neuropenia.  During hospitalization, patient was seen by GI and oncology. Patient has been having pancytopenia with ongoing bleeding and conservative management has been pursued at this time.  New events last 24 hours / Subjective: Patient without any new complaints, states that she is feeling better, trying to get some rest.  Denies any blood loss overnight.  Admits to some itching on her back and her arm.  Assessment & Plan:   Principal Problem:   Bacteremia associated with intravascular line ([place]) Active Problems:   Malignant neoplasm of right lung (HCC)   Hypokalemia   Acute on chronic anemia   Gastrointestinal hemorrhage   Pancytopenia (HCC)   Renal failure   AKI (acute kidney injury) ([place])   Acute  blood loss anemia   Pancytopenia -Likely secondary to pemetrexed toxicity. Status post multiple units of packed RBC and platelet transfusion. -On Granix as per oncology.Oncology recommends Granix daily until [facility] is above 1.5.  -Willcontinue totransfuse for hemoglobin less than 8 and platelet count less than 20,000 or active bleeding. -Transfusing packed red blood cell and platelets today -Monitor CBC  AKI -Likely secondary tochemotherapy and severe anemia with possible volume depletion. Received several liters of IV fluidduring hospitalization. Renal ultrasound was within normal limits. -Baseline creatinine 1-1.4 -Slowly improving -Continue IV fluid  Acute GI bleed -Patient had RBC scan showing mild acute GI bleeding in the small bowel. Unlikely to yield muchfrom angiogram/embolization as per GI.Marland Kitchen Likely mucosal bleeding and GIrecommended platelet and RBC transfusionas above.  -Continue Protonix   RUE extremity phlebitis -Improved  Stage IV lung cancer -Adenocarcinomaby biopsy. Status post 2 cycles of pemetrexed and pembrolizumabm. Oncology on board  Seizure -Unclear of etiology, possibility of hypoxia. CT scan without any acute findings.   Lactic acidosis -Chest x-ray was clear. UA unremarkable. Resolved improved tissue perfusion. No source of infection. Resolved  Coagulase-negative staph bacteremia -Only 1 set of blood culture was obtained. Both aerobic and anaerobic bottles were positive. Received vancomycin. Per ID continue cefazolin through 4/25  Hypertension -Continue Norvasc  Hypokalemia -Replace, trend    DVT prophylaxis:  SCDs Start: 07/01/20 1431  Code Status: Full code Family Communication: No family at bedside Disposition Plan:  Status is: Inpatient  Remains inpatient appropriate because:IV treatments appropriate due to intensity of illness or inability to take PO and Inpatient level of care appropriate due to  severity of illness   Dispo: The patient  is from: Home              Anticipated d/c is to: Home, patient declined SNF placement              Patient currently is not medically stable to d/c.  Platelet and packed red blood cell transfusions ordered today   Difficult to place patient No   Antimicrobials:  Anti-infectives (From admission, onward)   Start     Dose/Rate Route Frequency Ordered Stop   07/08/20 1000  ceFAZolin (ANCEF) IVPB 2g/100 mL premix        2 g 200 mL/hr over 30 Minutes Intravenous Every 8 hours 07/08/20 0744 07/14/20 2359   07/06/20 2200  ceFAZolin (ANCEF) IVPB 1 g/50 mL premix  Status:  Discontinued        1 g 100 mL/hr over 30 Minutes Intravenous Every 12 hours 07/06/20 1058 07/08/20 0744   07/05/20 0600  vancomycin (VANCOREADY) IVPB 750 mg/150 mL        750 mg 150 mL/hr over 60 Minutes Intravenous  Once 07/04/20 1351 07/05/20 0712   07/03/20 1030  vancomycin (VANCOREADY) IVPB 750 mg/150 mL        750 mg 150 mL/hr over 60 Minutes Intravenous  Once 07/03/20 0942 07/03/20 1155   07/01/20 1145  ceFEPIme (MAXIPIME) 2 g in sodium chloride 0.9 % 100 mL IVPB        2 g 200 mL/hr over 30 Minutes Intravenous  Once 07/01/20 1140 07/01/20 1243   07/01/20 1145  vancomycin (VANCOCIN) IVPB 1000 mg/200 mL premix        1,000 mg 200 mL/hr over 60 Minutes Intravenous  Once 07/01/20 1140 07/01/20 1359       Objective: Vitals:   07/10/20 0500 07/10/20 0500 07/10/20 0542 07/10/20 0751  BP:  138/66 115/65 113/66  Pulse:  94 (!) 102 (!) 101  Resp:  (!) 24 16   Temp:  99.2 F (37.3 C) 99.4 F (37.4 C) 98.4 F (36.9 C)  TempSrc:  Oral Oral Oral  SpO2:  99% 99% 98%  Weight: 65.4 kg     Height:        Intake/Output Summary (Last 24 hours) at 07/10/2020 1032 Last data filed at 07/10/2020 0900 Gross per 24 hour  Intake 1290 ml  Output 2650 ml  Net -1360 ml   Filed Weights   07/08/20 0500 07/09/20 0440 07/10/20 0500  Weight: 66.5 kg 69.6 kg 65.4 kg     Examination: General exam: Appears calm and comfortable  Respiratory system: Clear to auscultation. Respiratory effort normal.  On nasal cannula Cardiovascular system: S1 & S2 heard, tachycardic, regular rhythm, heart rate in the low 100s. No pedal edema. Gastrointestinal system: Abdomen is nondistended, soft and nontender. Normal bowel sounds heard. Central nervous system: Alert and oriented. Non focal exam. Speech clear  Extremities: Symmetric in appearance bilaterally  Skin: No rashes, lesions or ulcers on exposed skin  Psychiatry: Judgement and insight appear stable. Mood & affect appropriate.   Data Reviewed: I have personally reviewed following labs and imaging studies  CBC: Recent Labs  Lab 07/06/20 0500 07/07/20 0500 07/07/20 2002 07/08/20 0458 07/09/20 0449 07/09/20 2031 07/10/20 0814  WBC 0.9* 1.2*  --  1.6* 1.4* 1.1* 1.3*  NEUTROABS 0.3* 0.6*  --  0.4* 0.5*  --  0.5*  HGB 8.0* 7.8* 8.5* 8.4* 8.3* 8.1* 7.7*  HCT 23.3* 23.9* 25.8* 25.2* 25.1* 24.1* 23.4*  MCV 89.3 92.3  --  87.5 87.8 88.0 88.3  PLT 24* 15*  --  8* 5* 5* <5*   Basic Metabolic Panel: Recent Labs  Lab 07/04/20 0538 07/05/20 0620 07/06/20 0500 07/07/20 0500 07/08/20 0458 07/09/20 0449 07/10/20 0814  NA 139 139 140 139 142 141 141  K 3.8 3.3* 3.6 3.3* 3.2* 3.0* 2.9*  CL 115* 115* 115* 113* 113* 111 111  CO2 16* 17* 17* 20* 21* 23 24  GLUCOSE 93 113* 98 92 92 103* 83  BUN 83* 67* 48* 36* 28* 23* 18  CREATININE 3.27* 2.79* 2.25* 1.99* 1.91* 1.96* 1.96*  CALCIUM 7.1* 7.3* 7.4* 7.3* 7.2* 7.3* 7.3*  MG 1.8 1.6* 1.4*  --  1.0* 1.7  --   PHOS 4.9* 4.1  --   --   --   --   --    GFR: Estimated Creatinine Clearance: 29.6 mL/min (A) (by C-G formula based on SCr of 1.96 mg/dL (H)). Liver Function Tests: Recent Labs  Lab 07/04/20 0538  AST 39  ALT 28  ALKPHOS 53  BILITOT 0.7  PROT 4.1*  ALBUMIN 1.4*   No results for input(s): LIPASE, AMYLASE in the last 168 hours. No results for input(s):  AMMONIA in the last 168 hours. Coagulation Profile: No results for input(s): INR, PROTIME in the last 168 hours. Cardiac Enzymes: No results for input(s): CKTOTAL, CKMB, CKMBINDEX, TROPONINI in the last 168 hours. BNP (last 3 results) No results for input(s): PROBNP in the last 8760 hours. HbA1C: No results for input(s): HGBA1C in the last 72 hours. CBG: Recent Labs  Lab 07/09/20 0755 07/09/20 1255 07/09/20 1550 07/10/20 0457 07/10/20 0736  GLUCAP 111* 113* 107* 91 85   Lipid Profile: No results for input(s): CHOL, HDL, LDLCALC, TRIG, CHOLHDL, LDLDIRECT in the last 72 hours. Thyroid Function Tests: No results for input(s): TSH, T4TOTAL, FREET4, T3FREE, THYROIDAB in the last 72 hours. Anemia Panel: No results for input(s): VITAMINB12, FOLATE, FERRITIN, TIBC, IRON, RETICCTPCT in the last 72 hours. Sepsis Labs: No results for input(s): PROCALCITON, LATICACIDVEN in the last 168 hours.  Recent Results (from the past 240 hour(s))  Blood culture (routine single)     Status: Abnormal   Collection Time: 07/01/20 11:53 AM   Specimen: BLOOD  Result Value Ref Range Status   Specimen Description   Final    BLOOD LEFT ANTECUBITAL Performed at [facility] [address], [place], [place] [address]    Special Requests   Final    BOTTLES DRAWN AEROBIC AND ANAEROBIC Blood Culture adequate volume Performed at [facility] [address] [place], [place] [address]    Culture  Setup Time   Final    GRAM POSITIVE COCCI IN CLUSTERS IN BOTH AEROBIC AND ANAEROBIC BOTTLES CRITICAL RESULT CALLED TO, READ BACK BY AND VERIFIED WITH: Marie AT [place] ON 07/02/20 Performed at [facility], [place] [address], [place], [place] [address]    Culture STAPHYLOCOCCUS HOMINIS (A)  Final   Report Status 07/06/2020 FINAL  Final   Organism ID, Bacteria STAPHYLOCOCCUS HOMINIS  Final      Susceptibility   Staphylococcus hominis - MIC*     CIPROFLOXACIN <=0.5 SENSITIVE Sensitive     ERYTHROMYCIN >=8 RESISTANT Resistant     GENTAMICIN <=0.5 SENSITIVE Sensitive     OXACILLIN <=0.25 SENSITIVE Sensitive     TETRACYCLINE <=1 SENSITIVE Sensitive     VANCOMYCIN <=0.5 SENSITIVE Sensitive     TRIMETH/SULFA 20 SENSITIVE Sensitive     CLINDAMYCIN <=0.25 SENSITIVE  Sensitive     RIFAMPIN <=0.5 SENSITIVE Sensitive     Inducible Clindamycin NEGATIVE Sensitive     * STAPHYLOCOCCUS HOMINIS  Blood Culture ID Panel (Reflexed)     Status: Abnormal   Collection Time: 07/01/20 11:53 AM  Result Value Ref Range Status   Enterococcus faecalis NOT DETECTED NOT DETECTED Final   Enterococcus Faecium NOT DETECTED NOT DETECTED Final   Listeria monocytogenes NOT DETECTED NOT DETECTED Final   Staphylococcus species DETECTED (A) NOT DETECTED Final    Comment: CRITICAL RESULT CALLED TO, READ BACK BY AND VERIFIED WITH: PHARMD JUSTIN LEGGE BY KJ AT 1012 ON [id number]    Staphylococcus aureus (BCID) NOT DETECTED NOT DETECTED Final   Staphylococcus epidermidis NOT DETECTED NOT DETECTED Final   Staphylococcus lugdunensis NOT DETECTED NOT DETECTED Final   Streptococcus species NOT DETECTED NOT DETECTED Final   Streptococcus agalactiae NOT DETECTED NOT DETECTED Final   Streptococcus pneumoniae NOT DETECTED NOT DETECTED Final   Streptococcus pyogenes NOT DETECTED NOT DETECTED Final   A.calcoaceticus-baumannii NOT DETECTED NOT DETECTED Final   Bacteroides fragilis NOT DETECTED NOT DETECTED Final   Enterobacterales NOT DETECTED NOT DETECTED Final   Enterobacter cloacae complex NOT DETECTED NOT DETECTED Final   Escherichia coli NOT DETECTED NOT DETECTED Final   Klebsiella aerogenes NOT DETECTED NOT DETECTED Final   Klebsiella oxytoca NOT DETECTED NOT DETECTED Final   Klebsiella pneumoniae NOT DETECTED NOT DETECTED Final   Proteus species NOT DETECTED NOT DETECTED Final   Salmonella species NOT DETECTED NOT DETECTED Final   Serratia marcescens NOT DETECTED NOT  DETECTED Final   Haemophilus influenzae NOT DETECTED NOT DETECTED Final   Neisseria meningitidis NOT DETECTED NOT DETECTED Final   Pseudomonas aeruginosa NOT DETECTED NOT DETECTED Final   Stenotrophomonas maltophilia NOT DETECTED NOT DETECTED Final   Candida albicans NOT DETECTED NOT DETECTED Final   Candida auris NOT DETECTED NOT DETECTED Final   Candida glabrata NOT DETECTED NOT DETECTED Final   Candida krusei NOT DETECTED NOT DETECTED Final   Candida parapsilosis NOT DETECTED NOT DETECTED Final   Candida tropicalis NOT DETECTED NOT DETECTED Final   Cryptococcus neoformans/gattii NOT DETECTED NOT DETECTED Final    Comment: Performed at [facility], [address], [place], [place] [address]  Resp Panel by RT-PCR (Flu A&B, Covid) Nasopharyngeal Swab     Status: None   Collection Time: 07/01/20  2:05 PM   Specimen: Nasopharyngeal Swab; Nasopharyngeal(NP) swabs in vial transport medium  Result Value Ref Range Status   SARS Coronavirus 2 by RT PCR NEGATIVE NEGATIVE Final    Comment: (NOTE) SARS-CoV-2 target nucleic acids are NOT DETECTED.  The SARS-CoV-2 RNA is generally detectable in upper respiratory specimens during the acute phase of infection. The lowest concentration of SARS-CoV-2 viral copies this assay can detect is 138 copies/mL. A negative result does not preclude SARS-Cov-2 infection and should not be used as the sole basis for treatment or other patient management decisions. A negative result may occur with  improper specimen collection/handling, submission of specimen other than nasopharyngeal swab, presence of viral mutation(s) within the areas targeted by this assay, and inadequate number of viral copies(<138 copies/mL). A negative result must be combined with clinical observations, patient history, and epidemiological information. The expected result is Negative.  Fact Sheet for Patients:  EntrepreneurPulse.com.au  Fact Sheet for Healthcare  Providers:  IncredibleEmployment.be  This test is no t yet approved or cleared by the [place] FDA and  has been authorized for detection and/or  diagnosis of SARS-CoV-2 by FDA under an Emergency Use Authorization (EUA). This EUA will remain  in effect (meaning this test can be used) for the duration of the COVID-19 declaration under Section 564(b)(1) of the Act, 21 U.S.C.section 360bbb-3(b)(1), unless the authorization is terminated  or revoked sooner.       Influenza A by PCR NEGATIVE NEGATIVE Final   Influenza B by PCR NEGATIVE NEGATIVE Final    Comment: (NOTE) The Xpert Xpress SARS-CoV-2/FLU/RSV plus assay is intended as an aid in the diagnosis of influenza from Nasopharyngeal swab specimens and should not be used as a sole basis for treatment. Nasal washings and aspirates are unacceptable for Xpert Xpress SARS-CoV-2/FLU/RSV testing.  Fact Sheet for Patients: EntrepreneurPulse.com.au  Fact Sheet for Healthcare Providers: IncredibleEmployment.be  This test is not yet approved or cleared by the [place] FDA and has been authorized for detection and/or diagnosis of SARS-CoV-2 by FDA under an Emergency Use Authorization (EUA). This EUA will remain in effect (meaning this test can be used) for the duration of the COVID-19 declaration under Section 564(b)(1) of the Act, 21 U.S.C. section 360bbb-3(b)(1), unless the authorization is terminated or revoked.  Performed at [facility], [place] [address] [place], [place] [address]   Urine culture     Status: None   Collection Time: 07/01/20  2:16 PM   Specimen: In/Out Cath Urine  Result Value Ref Range Status   Specimen Description   Final    IN/OUT CATH URINE Performed at [facility] [address] [place], [place] [address]    Special Requests   Final    NONE Performed at [facility], [place] [address] [place], [place] [address]    Culture   Final    NO GROWTH Performed at [facility], [place] [address] [place], [place] [address]    Report Status 07/04/2020 FINAL  Final  MRSA PCR Screening     Status: Abnormal   Collection Time: 07/01/20  4:57 PM   Specimen: Nasopharyngeal  Result Value Ref Range Status   MRSA by PCR POSITIVE (A) NEGATIVE Final    Comment:        The GeneXpert MRSA Assay (FDA approved for NASAL specimens only), is one component of a comprehensive MRSA colonization surveillance program. It is not intended to diagnose MRSA infection nor to guide or monitor treatment for MRSA infections. RESULT CALLED TO, READ BACK BY AND VERIFIED WITH: BARKSDALE,T. RN @1932  ON 04.11.2022 BY COHEN,K Performed at [facility], [place] [address] [place], [place] [address]       Radiology Studies: No results found.    Scheduled Meds: . sodium chloride   Intravenous Once  . sodium chloride   Intravenous Once  . sodium chloride   Intravenous Once  . amLODipine  5 mg Oral Daily  . Chlorhexidine Gluconate Cloth  6 each Topical Daily  . ferrous sulfate  325 mg Oral BID WC  . folic acid  1 mg Oral Daily  . insulin aspart  0-9 Units Subcutaneous TID WC  . mouth rinse  15 mL Mouth Rinse BID  . pantoprazole (PROTONIX) IV  40 mg Intravenous Q24H  . potassium chloride  40 mEq Oral Q4H  . sodium chloride flush  10-40 mL Intracatheter Q12H   Continuous Infusions: .  ceFAZolin (ANCEF) IV 2 g (07/10/20 0804)  . lactated ringers 125 mL/hr at 07/10/20 0552     LOS: 9 days  Time spent: 25 minutes   Dessa Phi, DO [facility] 07/10/2020, 10:32 AM   Available via Epic secure chat 7am-7pm After these hours, please refer to coverage provider listed on [email or web address]

## 2020-07-10 NOTE — Telephone Encounter (Signed)
R/s appts per 4/20 sch msg. Pt's niece is aware.

## 2020-07-10 NOTE — Progress Notes (Addendum)
Physical Therapy Treatment Patient Details Name: Brenda [name] MRN: [id number] DOB: November 15, 1960 Today's Date: 07/10/2020    History of Present Illness 60 years old female adm with Bacteremia associated with intravascular line, ARF, acute GI bleed, pancytopenia.   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 Julien Nordmann presented to the hospital with epistaxis, dark stools and generalized weakness for few days.  CXR showed Port-A-Cath in place and minimal right pleural effusion but otherwise clear.    PT Comments    Pt with limited progress d/t medical issues. Platelets <5k/ul, BP 90/60 (checked after attempted to sit EOB and pt with c/o dizziness). Deferred further activity for pt safety. Agreeable to bed level exercises.  Will continue to follow in acute setting. Pt is currently refusing SNF   Follow Up Recommendations  SNF;Other (comment) (refusing SNF, will need HHPT)     Equipment Recommendations  Rolling walker with 5" wheels    Recommendations for Other Services       Precautions / Restrictions Precautions Precautions: Fall Precaution Comments: monitor VS,  decr BP, decr platelets Restrictions Weight Bearing Restrictions: No    Mobility  Bed Mobility Overal bed mobility: Needs Assistance Bed Mobility: Supine to Sit;Sit to Supine     Supine to sit: Min assist;HOB elevated Sit to supine: Supervision   General bed mobility comments: attempted sitting, pt too dizzy. returned to supine BP 90/60    Transfers Overall transfer level: Needs assistance Equipment used: 1 person hand held assist Transfers: Sit to/from [employer] Sit to Stand: Min assist Stand pivot transfers: Min assist       General transfer comment: deferred d/t decr BP and low platelets  Ambulation/Gait                 Stairs             Wheelchair Mobility    Modified Rankin  (Stroke Patients Only)       Balance Overall balance assessment: Needs assistance Sitting-balance support: No upper extremity supported Sitting balance-Leahy Scale: Fair     Standing balance support: Single extremity supported Standing balance-Leahy Scale: Poor                              Cognition Arousal/Alertness: Awake/alert Behavior During Therapy: WFL for tasks assessed/performed Overall Cognitive Status: Within Functional Limits for tasks assessed                                        Exercises  ankle pumps x 10  hip abd/adduction AAROM x 10 Heel slides x 5  Quad sets x 5    General Comments        Pertinent Vitals/Pain Pain Assessment: Faces Faces Pain Scale: Hurts little more Pain Location: abd Pain Descriptors / Indicators: Grimacing Pain Intervention(s): Limited activity within patient's tolerance;Monitored during session    Home Living Family/patient expects to be discharged to:: Private residence Living Arrangements: Spouse/significant other Available Help at Discharge: Friend(s) Type of Home: Apartment Home Access: Stairs to enter Entrance Stairs-Rails: Right Home Layout: One level Home Equipment: None Additional Comments: From PT eval: Patient reports she lives in an apt, previous notes state pt has been living in a [place]. pt reports today that her "old man" will be with her, she states he  helps her up an down the steps. On OT eval patient reports living at hotel but thinking about her and her brother getting a place. Lives with (maybe [place]?) but he works.    Prior Function Level of Independence: Needs assistance  Gait / Transfers Assistance Needed: reports her brother helps her up the steps ADL's / Homemaking Assistance Needed: Ind with BADLs and is only able to do light cooking like boiling water     PT Goals (current goals can now be found in the care plan section) Acute Rehab PT Goals Patient Stated Goal: get  stronger PT Goal Formulation: With patient Time For Goal Achievement: 07/20/20 Potential to Achieve Goals: Good Progress towards PT goals: Progressing toward goals    Frequency    Min 3X/week      PT Plan Current plan remains appropriate    Co-evaluation              AM-PAC PT "6 Clicks" Mobility   Outcome Measure  Help needed turning from your back to your side while in a flat bed without using bedrails?: A Little Help needed moving from lying on your back to sitting on the side of a flat bed without using bedrails?: A Little Help needed moving to and from a bed to a chair (including a wheelchair)?: A Lot Help needed standing up from a chair using your arms (e.g., wheelchair or bedside chair)?: A Lot Help needed to walk in hospital room?: A Lot Help needed climbing 3-5 steps with a railing? : A Lot 6 Click Score: 14    End of Session Equipment Utilized During Treatment: Gait belt Activity Tolerance: Treatment limited secondary to medical complications (Comment) (decr BP and low platelets) Patient left: in bed;with call bell/phone within reach;with bed alarm set   PT Visit Diagnosis: Muscle weakness (generalized) (M62.81);Difficulty in walking, not elsewhere classified (R26.2)     Time: 1209-1223 PT Time Calculation (min) (ACUTE ONLY): 14 min  Charges:  $Therapeutic Exercise: 8-22 mins                     Baxter Flattery, PT  Acute Rehab Dept ([place]) [phone] Pager [phone]  07/10/2020    [facility] 07/10/2020, 1:38 PM

## 2020-07-10 NOTE — Evaluation (Signed)
Occupational Therapy Evaluation Patient Details Name: Stuart Guillen MRN: [id number] DOB: 1960/10/18 Today's Date: 07/10/2020    History of Present Illness 60 years old female adm with Bacteremia associated with intravascular line, ARF, acute GI bleed, pancytopenia.   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 Julien Nordmann presented to the hospital with epistaxis, dark stools and generalized weakness for few days.  CXR showed Port-A-Cath in place and minimal right pleural effusion but otherwise clear.   Clinical Impression   Ms. Kristiane Juran is a 60 year old woman who presents with generalized weakness, decreased activity tolerance, impaired balance, impaired cardiopulmonary endurance currently on 4 L [place] and with complaints of pain resulting in decreased ability to perform ADLs and functional mobility. ON evaluation patient complaining of overall weakness and abdominal pain. Patient min assist to transfer into sitting and min assist to transfer to [facility]. Patient complaining of lightheadedness and became mildly dyspneic with activity. No machine in room to monitor vital signs and not able to leave patient's side to retrieve. Patient able to wipe self on [facility] but returned to bed due to overall fatigue and continued complaints of abdominal pain. Patient needing setup to total assist for ADLs and activity limited to just transfer to [facility]. Patient will benefit from skilled OT services while in hospital to improve deficits and learn compensatory strategies as needed in order to return PLOF.  At this time recommend short term rehab at discharge.    Follow Up Recommendations  SNF    Equipment Recommendations  3 in 1 bedside commode    Recommendations for Other Services       Precautions / Restrictions Precautions Precautions: Fall Precaution Comments: monitor O2. lightheadedness Restrictions Weight Bearing  Restrictions: No      Mobility Bed Mobility Overal bed mobility: Needs Assistance Bed Mobility: Supine to Sit;Sit to Supine     Supine to sit: Min assist;HOB elevated Sit to supine: Supervision   General bed mobility comments: hand hold to raise up to transfer into sitting. Reports feeling lightheaded at side of bed.    Transfers Overall transfer level: Needs assistance Equipment used: 1 person hand held assist Transfers: Sit to/from [employer] Sit to Stand: Min assist Stand pivot transfers: Min assist       General transfer comment: steadying assist to stand and transfer to [place] Overall balance assessment: Needs assistance Sitting-balance support: No upper extremity supported Sitting balance-Leahy Scale: Fair     Standing balance support: Single extremity supported Standing balance-Leahy Scale: Poor                             ADL either performed or assessed with clinical judgement   ADL Overall ADL's : Needs assistance/impaired Eating/Feeding: Independent   Grooming: Set up;Sitting;Wash/dry face   Upper Body Bathing: Set up;Sitting   Lower Body Bathing: Moderate assistance;Sit to/from stand   Upper Body Dressing : Set up;Sitting   Lower Body Dressing: Moderate assistance;Sit to/from stand   Toilet Transfer: Min guard;Squat-pivot;BSC   Toileting- Water quality scientist and Hygiene: Minimal assistance;Sitting/lateral lean               Vision   Vision Assessment?: No apparent visual deficits     Perception     Praxis      Pertinent Vitals/Pain Pain Assessment: Faces Faces Pain Scale: Hurts even more Pain Location: abd Pain Descriptors /  Indicators: Cramping Pain Intervention(s): Monitored during session     Hand Dominance Right   Extremity/Trunk Assessment Upper Extremity Assessment Upper Extremity Assessment: Generalized weakness   Lower Extremity Assessment Lower Extremity Assessment: Defer to  PT evaluation       Communication Communication Communication: No difficulties   Cognition Arousal/Alertness: Awake/alert Behavior During Therapy: WFL for tasks assessed/performed Overall Cognitive Status: Within Functional Limits for tasks assessed                                     General Comments       Exercises     Shoulder Instructions      Home Living Family/patient expects to be discharged to:: Private residence Living Arrangements: Spouse/significant other Available Help at Discharge: Friend(s) Type of Home: Apartment Home Access: Stairs to enter [employer] of Steps: 14-16 Entrance Stairs-Rails: Right Home Layout: One level     Bathroom Shower/Tub: Tub/shower unit         Home Equipment: None   Additional Comments: From PT eval: Patient reports she lives in an apt, previous notes state pt has been living in a [place]. pt reports today that her "old man" will be with her, she states he helps her up an down the steps. On OT eval patient reports living at hotel but thinking about her and her brother getting a place. Lives with (maybe [place]?) but he works.      Prior Functioning/Environment Level of Independence: Needs assistance  Gait / Transfers Assistance Needed: reports her brother helps her up the steps ADL's / Homemaking Assistance Needed: Ind with BADLs and is only able to do light cooking like boiling water            OT Problem List: Decreased strength;Decreased activity tolerance;Impaired balance (sitting and/or standing);Decreased knowledge of use of DME or AE;Cardiopulmonary status limiting activity;Pain      OT Treatment/Interventions: Self-care/ADL training;Therapeutic exercise;DME and/or AE instruction;Therapeutic activities;Balance training;Patient/family education    OT Goals(Current goals can be found in the care plan section) Acute Rehab OT Goals Patient Stated Goal: get stronger OT Goal Formulation: With  patient Time For Goal Achievement: 07/24/20 Potential to Achieve Goals: Fair  OT Frequency: Min 2X/week   Barriers to D/C: Inaccessible home environment;Decreased caregiver support          Co-evaluation              AM-PAC OT "6 Clicks" Daily Activity     Outcome Measure Help from another person eating meals?: None Help from another person taking care of personal grooming?: A Little Help from another person toileting, which includes using toliet, bedpan, or urinal?: A Lot Help from another person bathing (including washing, rinsing, drying)?: A Lot Help from another person to put on and taking off regular upper body clothing?: A Little Help from another person to put on and taking off regular lower body clothing?: Total 6 Click Score: 15   End of Session Nurse Communication: Mobility status  Activity Tolerance: Patient limited by pain;Patient limited by fatigue Patient left: in bed;with call bell/phone within reach;with bed alarm set  OT Visit Diagnosis: Unsteadiness on feet (R26.81);Muscle weakness (generalized) (M62.81);Pain                Time: 1016-1040 OT Time Calculation (min): 24 min Charges:  OT General Charges $OT Visit: 1 Visit  Derl Barrow, OTR/L [place]  Office [phone] Pager:  [place] 07/10/2020, 12:43 PM

## 2020-07-10 NOTE — Progress Notes (Addendum)
Notified MD Maylene Roes of platelets being < than 5, and wbc being 1.3 via amion paging system. Also inquired about placing patient on neutropenic precautions. Awaiting new orders. Will continue to monitor.  09:54- Placed on neutropenic precautions, and explained to patient. Patient endorsed understanding. Also placed order for IV to disconnect current fluids as patient has blood and platelets ordered.  11:18- Single lumen port.1 unit of blood ready per blood bank. Blood transfusion delayed due to MD ordering magnesium replacement.  Patient has poor access. Will start unit of blood, after electrolyte placement is completed.

## 2020-07-10 NOTE — Plan of Care (Signed)
  Problem: Education: Goal: Knowledge of General Education information will improve Description: Including pain rating scale, medication(s)/side effects and non-pharmacologic comfort measures Outcome: Progressing   Problem: Health Behavior/Discharge Planning: Goal: Ability to manage health-related needs will improve Outcome: Progressing   Problem: Clinical Measurements: Goal: Ability to maintain clinical measurements within normal limits will improve Outcome: Progressing Goal: Will remain free from infection Outcome: Progressing Goal: Diagnostic test results will improve Outcome: Progressing Goal: Respiratory complications will improve Outcome: Progressing Goal: Cardiovascular complication will be avoided Outcome: Progressing   Problem: Pain Managment: Goal: General experience of comfort will improve Outcome: Progressing   Problem: Safety: Goal: Ability to remain free from injury will improve Outcome: Progressing   Problem: Skin Integrity: Goal: Risk for impaired skin integrity will decrease Outcome: Progressing   Problem: Elimination: Goal: Will not experience complications related to bowel motility Outcome: Progressing Goal: Will not experience complications related to urinary retention Outcome: Progressing

## 2020-07-10 NOTE — Plan of Care (Signed)

## 2020-07-10 NOTE — Progress Notes (Signed)
Placed order for IV team to come and disconnect fluids and hook up to normal saline, so unit of platelets can be hung, due to patient having right chest port. Stat order placed. Will continue to monitor.

## 2020-07-11 DIAGNOSIS — T827XXD Infection and inflammatory reaction due to other cardiac and vascular devices, implants and grafts, subsequent encounter: Secondary | ICD-10-CM | POA: Diagnosis not present

## 2020-07-11 DIAGNOSIS — R7881 Bacteremia: Secondary | ICD-10-CM | POA: Diagnosis not present

## 2020-07-11 LAB — BPAM RBC
Blood Product Expiration Date: 202205142359
Blood Product Expiration Date: 202205142359
ISSUE DATE / TIME: 202204171251
ISSUE DATE / TIME: 202204201317
Unit Type and Rh: 5100
Unit Type and Rh: 5100

## 2020-07-11 LAB — CBC
HCT: 29.5 % — ABNORMAL LOW (ref 36.0–46.0)
Hemoglobin: 9.8 g/dL — ABNORMAL LOW (ref 12.0–15.0)
MCH: 29.1 pg (ref 26.0–34.0)
MCHC: 33.2 g/dL (ref 30.0–36.0)
MCV: 87.5 fL (ref 80.0–100.0)
Platelets: 18 10*3/uL — CL (ref 150–400)
RBC: 3.37 MIL/uL — ABNORMAL LOW (ref 3.87–5.11)
RDW: 16.1 % — ABNORMAL HIGH (ref 11.5–15.5)
WBC: 2.3 10*3/uL — ABNORMAL LOW (ref 4.0–10.5)
nRBC: 0 % (ref 0.0–0.2)

## 2020-07-11 LAB — BPAM PLATELET PHERESIS
Blood Product Expiration Date: 202204212359
Blood Product Expiration Date: 202204212359
Blood Product Expiration Date: 202204212359
Blood Product Expiration Date: 202204222359
ISSUE DATE / TIME: 202204200153
ISSUE DATE / TIME: 202204200346
ISSUE DATE / TIME: 202204201733
ISSUE DATE / TIME: 202204202028
Unit Type and Rh: 5100
Unit Type and Rh: 5100
Unit Type and Rh: 5100
Unit Type and Rh: 5100

## 2020-07-11 LAB — BASIC METABOLIC PANEL
Anion gap: 7 (ref 5–15)
BUN: 15 mg/dL (ref 6–20)
CO2: 24 mmol/L (ref 22–32)
Calcium: 7.4 mg/dL — ABNORMAL LOW (ref 8.9–10.3)
Chloride: 114 mmol/L — ABNORMAL HIGH (ref 98–111)
Creatinine, Ser: 1.77 mg/dL — ABNORMAL HIGH (ref 0.44–1.00)
GFR, Estimated: 33 mL/min — ABNORMAL LOW (ref >60)
Glucose, Bld: 99 mg/dL (ref 70–99)
Potassium: 3.4 mmol/L — ABNORMAL LOW (ref 3.5–5.1)
Sodium: 145 mmol/L (ref 135–145)

## 2020-07-11 LAB — TYPE AND SCREEN
ABO/RH(D): O POS
Antibody Screen: POSITIVE
Donor AG Type: NEGATIVE
Donor AG Type: NEGATIVE
Unit division: 0
Unit division: 0

## 2020-07-11 LAB — PREPARE PLATELET PHERESIS
Unit division: 0
Unit division: 0
Unit division: 0
Unit division: 0

## 2020-07-11 LAB — GLUCOSE, CAPILLARY
Glucose-Capillary: 82 mg/dL (ref 70–99)
Glucose-Capillary: 116 mg/dL — ABNORMAL HIGH (ref 70–99)
Glucose-Capillary: 135 mg/dL — ABNORMAL HIGH (ref 70–99)

## 2020-07-11 LAB — MAGNESIUM: Magnesium: 1.6 mg/dL — ABNORMAL LOW (ref 1.7–2.4)

## 2020-07-11 MED ORDER — SODIUM CHLORIDE 0.9% IV SOLUTION: Freq: Once | INTRAVENOUS | Status: AC

## 2020-07-11 MED ORDER — POTASSIUM CHLORIDE CRYS ER 20 MEQ PO TBCR
40.0000 meq | EXTENDED_RELEASE_TABLET | Freq: Once | ORAL | Status: AC
  Administered 2020-07-11: 40 meq via ORAL
  Filled 2020-07-11: qty 2

## 2020-07-11 MED ORDER — MAGNESIUM SULFATE 2 GM/50ML IV SOLN
2.0000 g | Freq: Once | INTRAVENOUS | Status: AC
  Administered 2020-07-11: 2 g via INTRAVENOUS
  Filled 2020-07-11: qty 50

## 2020-07-11 MED ORDER — LOPERAMIDE HCL 2 MG PO CAPS
2.0000 mg | ORAL_CAPSULE | ORAL | Status: DC | PRN
  Administered 2020-07-11: 2 mg via ORAL
  Filled 2020-07-11: qty 1

## 2020-07-11 NOTE — Progress Notes (Signed)
Assessment: - 60 yo with profound pancytopenias d/t chemo; neutropenic - patient received Granix 5 mg/kg daily x 8 doses from 4/12-19 per Oncology, with ANC goal > 1500 - ANC remains low (500) after completing all 8 doses - Granix labeling permits up to 14 days of therapy to achieve ANC goal  Plan - D/w patient's primary oncologist, Dr. Julien Nordmann - would like to hold off on any additional Granix for now.  Reuel Boom, PharmD, BCPS [phone] 07/11/2020, 2:23 PM

## 2020-07-11 NOTE — Progress Notes (Signed)
PROGRESS NOTE    Brenda [name]  ZCH:[id number] DOB: 05-16-1960 DOA: 07/01/2020 PCP: Pcp, No     Brief Narrative:  Brenda [name] is a 60 years old female with past medical history of stage IV non-small cell lung cancer- adenocarcinoma of right lung, undergoing chemotherapy with Dr. Julien Nordmann, who presented tothehospital with epistaxis,dark stools and generalized weakness for few days. On initial presentation,blood pressure was stable but patientwas mildly hypothermic.CXRshowedPort-A-Cath in place and minimal right pleural effusion but otherwise clear. Her lactic acid was >11, with INR 1.4 andhad AKI withcreatinine over 5 without hyperkalemia and metabolic acidosis. Patient received PRBC in the ED and was more somnolent and had focal seizure-like activity. She was given Ativan and Keppra andhead CT was done. Bear hugger was initiated and the patient was admitted to the hospital. Hemoglobin was around 2.4 subsequently and was given packed RBC. Of note patient was initiated on chemotherapy on 09/28/2019 for 11 cycles and current regimen of with carboplatin for an AUC of 5, Alimta 500 mg/m2, and Keytruda 200 mg IV every 3 weeks. Alimtawasheld due to renal insufficiency and neuropenia.  During hospitalization, patient was seen by GI and oncology. Patient has been having pancytopenia with ongoing bleeding and conservative management has been pursued at this time.  New events last 24 hours / Subjective: Patient without any physical complaints today.  Denies any blood loss in about a week.  Itching has improved with hydrocortisone cream.  Assessment & Plan:   Principal Problem:   Bacteremia associated with intravascular line ([place]) Active Problems:   Malignant neoplasm of right lung (HCC)   Hypokalemia   Acute on chronic anemia   Gastrointestinal hemorrhage   Pancytopenia (HCC)   Renal failure   AKI (acute kidney injury) ([place])   Acute blood loss  anemia   Pancytopenia -Likely secondary to pemetrexed toxicity. Status post multiple units of packed RBC and platelet transfusion. -On Granix as per oncology.Oncology recommends Granix daily until [facility] is above 1.5.  -Willcontinue totransfuse for hemoglobin less than 8 and platelet count less than 20,000 or active bleeding. -CBC pending this morning  AKI -Likely secondary tochemotherapy and severe anemia with possible volume depletion. Received several liters of IV fluidduring hospitalization. Renal ultrasound was within normal limits. -Baseline creatinine 1-1.4 -Slowly improving -We will hold further IV fluid in setting of multiple blood products transfusions to avoid fluid overload, trend BMP  Acute GI bleed -Patient had RBC scan showing mild acute GI bleeding in the small bowel. Unlikely to yield muchfrom angiogram/embolization as per GI.Marland Kitchen Likely mucosal bleeding and GIrecommended platelet and RBC transfusionas above.  -Continue Protonix   RUE extremity phlebitis -Improved  Stage IV lung cancer -Adenocarcinomaby biopsy. Status post 2 cycles of pemetrexed and pembrolizumabm. Oncology on board  Seizure -Unclear of etiology, possibility of hypoxia. CT scan without any acute findings.   Lactic acidosis -Chest x-ray was clear. UA unremarkable. Resolved improved tissue perfusion. No source of infection. Resolved  Coagulase-negative staph bacteremia -Only 1 set of blood culture was obtained. Both aerobic and anaerobic bottles were positive. Received vancomycin. Per ID continue cefazolin through 4/25  Hypertension -Continue Norvasc  Hypokalemia -Replace, trend  Hypomagnesemia -Replace, trend    DVT prophylaxis:  SCDs Start: 07/01/20 1431  Code Status: Full code Family Communication: No family at bedside Disposition Plan:  Status is: Inpatient  Remains inpatient appropriate because:IV treatments appropriate due to intensity of illness  or inability to take PO and Inpatient level of care appropriate due to severity of illness  Dispo: The patient is from: Home              Anticipated d/c is to: Home, patient declined SNF placement              Patient currently is not medically stable to d/c.  Repeat CBC pending. Monitor Cr. Replace K and Mg.    Difficult to place patient No   Antimicrobials:  Anti-infectives (From admission, onward)   Start     Dose/Rate Route Frequency Ordered Stop   07/08/20 1000  ceFAZolin (ANCEF) IVPB 2g/100 mL premix        2 g 200 mL/hr over 30 Minutes Intravenous Every 8 hours 07/08/20 0744 07/14/20 2359   07/06/20 2200  ceFAZolin (ANCEF) IVPB 1 g/50 mL premix  Status:  Discontinued        1 g 100 mL/hr over 30 Minutes Intravenous Every 12 hours 07/06/20 1058 07/08/20 0744   07/05/20 0600  vancomycin (VANCOREADY) IVPB 750 mg/150 mL        750 mg 150 mL/hr over 60 Minutes Intravenous  Once 07/04/20 1351 07/05/20 0712   07/03/20 1030  vancomycin (VANCOREADY) IVPB 750 mg/150 mL        750 mg 150 mL/hr over 60 Minutes Intravenous  Once 07/03/20 0942 07/03/20 1155   07/01/20 1145  ceFEPIme (MAXIPIME) 2 g in sodium chloride 0.9 % 100 mL IVPB        2 g 200 mL/hr over 30 Minutes Intravenous  Once 07/01/20 1140 07/01/20 1243   07/01/20 1145  vancomycin (VANCOCIN) IVPB 1000 mg/200 mL premix        1,000 mg 200 mL/hr over 60 Minutes Intravenous  Once 07/01/20 1140 07/01/20 1359       Objective: Vitals:   07/10/20 2049 07/10/20 2252 07/11/20 0618 07/11/20 0838  BP: 119/69 (!) 142/71 126/81   Pulse: 96 98 95   Resp: 18 20 18    Temp: 98.6 F (37 C) 98.1 F (36.7 C) 98.2 F (36.8 C)   TempSrc: Oral Oral Oral   SpO2: 99% 99% 97% 97%  Weight:      Height:        Intake/Output Summary (Last 24 hours) at 07/11/2020 1036 Last data filed at 07/11/2020 0600 Gross per 24 hour  Intake 1965.4 ml  Output 2350 ml  Net -384.6 ml   Filed Weights   07/08/20 0500 07/09/20 0440 07/10/20 0500   Weight: 66.5 kg 69.6 kg 65.4 kg    Examination: General exam: Appears calm and comfortable  Respiratory system: Clear to auscultation. Respiratory effort normal. Cardiovascular system: S1 & S2 heard, RRR. No pedal edema. Gastrointestinal system: Abdomen is nondistended, soft and nontender. Normal bowel sounds heard. Central nervous system: Alert and oriented. Non focal exam. Speech clear  Extremities: Symmetric in appearance bilaterally  Skin: No rashes, lesions or ulcers on exposed skin  Psychiatry: Judgement and insight appear stable. Mood & affect appropriate.   Data Reviewed: I have personally reviewed following labs and imaging studies  CBC: Recent Labs  Lab 07/06/20 0500 07/07/20 0500 07/07/20 2002 07/08/20 0458 07/09/20 0449 07/09/20 2031 07/10/20 0814  WBC 0.9* 1.2*  --  1.6* 1.4* 1.1* 1.3*  NEUTROABS 0.3* 0.6*  --  0.4* 0.5*  --  0.5*  HGB 8.0* 7.8* 8.5* 8.4* 8.3* 8.1* 7.7*  HCT 23.3* 23.9* 25.8* 25.2* 25.1* 24.1* 23.4*  MCV 89.3 92.3  --  87.5 87.8 88.0 88.3  PLT 24* 15*  --  8* 5* 5* <5*  Basic Metabolic Panel: Recent Labs  Lab 07/05/20 0620 07/06/20 0500 07/07/20 0500 07/08/20 0458 07/09/20 0449 07/10/20 0814 07/11/20 0425  NA 139 140 139 142 141 141 145  K 3.3* 3.6 3.3* 3.2* 3.0* 2.9* 3.4*  CL 115* 115* 113* 113* 111 111 114*  CO2 17* 17* 20* 21* 23 24 24   GLUCOSE 113* 98 92 92 103* 83 99  BUN 67* 48* 36* 28* 23* 18 15  CREATININE 2.79* 2.25* 1.99* 1.91* 1.96* 1.96* 1.77*  CALCIUM 7.3* 7.4* 7.3* 7.2* 7.3* 7.3* 7.4*  MG 1.6* 1.4*  --  1.0* 1.7  --  1.6*  PHOS 4.1  --   --   --   --   --   --    GFR: Estimated Creatinine Clearance: 32.8 mL/min (A) (by C-G formula based on SCr of 1.77 mg/dL (H)). Liver Function Tests: No results for input(s): AST, ALT, ALKPHOS, BILITOT, PROT, ALBUMIN in the last 168 hours. No results for input(s): LIPASE, AMYLASE in the last 168 hours. No results for input(s): AMMONIA in the last 168 hours. Coagulation  Profile: No results for input(s): INR, PROTIME in the last 168 hours. Cardiac Enzymes: No results for input(s): CKTOTAL, CKMB, CKMBINDEX, TROPONINI in the last 168 hours. BNP (last 3 results) No results for input(s): PROBNP in the last 8760 hours. HbA1C: No results for input(s): HGBA1C in the last 72 hours. CBG: Recent Labs  Lab 07/10/20 0457 07/10/20 0736 07/10/20 1120 07/10/20 1814 07/11/20 0836  GLUCAP 91 85 103* 83 82   Lipid Profile: No results for input(s): CHOL, HDL, LDLCALC, TRIG, CHOLHDL, LDLDIRECT in the last 72 hours. Thyroid Function Tests: No results for input(s): TSH, T4TOTAL, FREET4, T3FREE, THYROIDAB in the last 72 hours. Anemia Panel: No results for input(s): VITAMINB12, FOLATE, FERRITIN, TIBC, IRON, RETICCTPCT in the last 72 hours. Sepsis Labs: No results for input(s): PROCALCITON, LATICACIDVEN in the last 168 hours.  Recent Results (from the past 240 hour(s))  Blood culture (routine single)     Status: Abnormal   Collection Time: 07/01/20 11:53 AM   Specimen: BLOOD  Result Value Ref Range Status   Specimen Description   Final    BLOOD LEFT ANTECUBITAL Performed at [facility] [address], [place], [place] [address]    Special Requests   Final    BOTTLES DRAWN AEROBIC AND ANAEROBIC Blood Culture adequate volume Performed at [facility] [address] [place], [place] [address]    Culture  Setup Time   Final    GRAM POSITIVE COCCI IN CLUSTERS IN BOTH AEROBIC AND ANAEROBIC BOTTLES CRITICAL RESULT CALLED TO, READ BACK BY AND VERIFIED WITH: [place] AT [place] ON 07/02/20 Performed at [facility], [place] [address], [place],  [address]    Culture STAPHYLOCOCCUS HOMINIS (A)  Final   Report Status 07/06/2020 FINAL  Final   Organism ID, Bacteria STAPHYLOCOCCUS HOMINIS  Final      Susceptibility   Staphylococcus hominis - MIC*    CIPROFLOXACIN <=0.5 SENSITIVE Sensitive     ERYTHROMYCIN  >=8 RESISTANT Resistant     GENTAMICIN <=0.5 SENSITIVE Sensitive     OXACILLIN <=0.25 SENSITIVE Sensitive     TETRACYCLINE <=1 SENSITIVE Sensitive     VANCOMYCIN <=0.5 SENSITIVE Sensitive     TRIMETH/SULFA 20 SENSITIVE Sensitive     CLINDAMYCIN <=0.25 SENSITIVE Sensitive     RIFAMPIN <=0.5 SENSITIVE Sensitive     Inducible Clindamycin NEGATIVE Sensitive     *  STAPHYLOCOCCUS HOMINIS  Blood Culture ID Panel (Reflexed)     Status: Abnormal   Collection Time: 07/01/20 11:53 AM  Result Value Ref Range Status   Enterococcus faecalis NOT DETECTED NOT DETECTED Final   Enterococcus Faecium NOT DETECTED NOT DETECTED Final   Listeria monocytogenes NOT DETECTED NOT DETECTED Final   Staphylococcus species DETECTED (A) NOT DETECTED Final    Comment: CRITICAL RESULT CALLED TO, READ BACK BY AND VERIFIED WITH: PHARMD JUSTIN LEGGE BY [place] AT 1012 ON [id number]    Staphylococcus aureus (BCID) NOT DETECTED NOT DETECTED Final   Staphylococcus epidermidis NOT DETECTED NOT DETECTED Final   Staphylococcus lugdunensis NOT DETECTED NOT DETECTED Final   Streptococcus species NOT DETECTED NOT DETECTED Final   Streptococcus agalactiae NOT DETECTED NOT DETECTED Final   Streptococcus pneumoniae NOT DETECTED NOT DETECTED Final   Streptococcus pyogenes NOT DETECTED NOT DETECTED Final   A.calcoaceticus-baumannii NOT DETECTED NOT DETECTED Final   Bacteroides fragilis NOT DETECTED NOT DETECTED Final   Enterobacterales NOT DETECTED NOT DETECTED Final   Enterobacter cloacae complex NOT DETECTED NOT DETECTED Final   Escherichia coli NOT DETECTED NOT DETECTED Final   Klebsiella aerogenes NOT DETECTED NOT DETECTED Final   Klebsiella oxytoca NOT DETECTED NOT DETECTED Final   Klebsiella pneumoniae NOT DETECTED NOT DETECTED Final   Proteus species NOT DETECTED NOT DETECTED Final   Salmonella species NOT DETECTED NOT DETECTED Final   Serratia marcescens NOT DETECTED NOT DETECTED Final   Haemophilus influenzae NOT DETECTED NOT  DETECTED Final   Neisseria meningitidis NOT DETECTED NOT DETECTED Final   Pseudomonas aeruginosa NOT DETECTED NOT DETECTED Final   Stenotrophomonas maltophilia NOT DETECTED NOT DETECTED Final   Candida albicans NOT DETECTED NOT DETECTED Final   Candida auris NOT DETECTED NOT DETECTED Final   Candida glabrata NOT DETECTED NOT DETECTED Final   Candida krusei NOT DETECTED NOT DETECTED Final   Candida parapsilosis NOT DETECTED NOT DETECTED Final   Candida tropicalis NOT DETECTED NOT DETECTED Final   Cryptococcus neoformans/gattii NOT DETECTED NOT DETECTED Final    Comment: Performed at [facility], [address], [place], [place] [address]  Resp Panel by RT-PCR (Flu A&B, Covid) Nasopharyngeal Swab     Status: None   Collection Time: 07/01/20  2:05 PM   Specimen: Nasopharyngeal Swab; Nasopharyngeal(NP) swabs in vial transport medium  Result Value Ref Range Status   SARS Coronavirus 2 by RT PCR NEGATIVE NEGATIVE Final    Comment: (NOTE) SARS-CoV-2 target nucleic acids are NOT DETECTED.  The SARS-CoV-2 RNA is generally detectable in upper respiratory specimens during the acute phase of infection. The lowest concentration of SARS-CoV-2 viral copies this assay can detect is 138 copies/mL. A negative result does not preclude SARS-Cov-2 infection and should not be used as the sole basis for treatment or other patient management decisions. A negative result may occur with  improper specimen collection/handling, submission of specimen other than nasopharyngeal swab, presence of viral mutation(s) within the areas targeted by this assay, and inadequate number of viral copies(<138 copies/mL). A negative result must be combined with clinical observations, patient history, and epidemiological information. The expected result is Negative.  Fact Sheet for Patients:  EntrepreneurPulse.com.au  Fact Sheet for Healthcare Providers:   IncredibleEmployment.be  This test is no t yet approved or cleared by the [place] FDA and  has been authorized for detection and/or diagnosis of SARS-CoV-2 by FDA under an Emergency Use Authorization (EUA). This EUA will remain  in effect (meaning this test can  be used) for the duration of the COVID-19 declaration under Section 564(b)(1) of the Act, 21 U.S.C.section 360bbb-3(b)(1), unless the authorization is terminated  or revoked sooner.       Influenza A by PCR NEGATIVE NEGATIVE Final   Influenza B by PCR NEGATIVE NEGATIVE Final    Comment: (NOTE) The Xpert Xpress SARS-CoV-2/FLU/RSV plus assay is intended as an aid in the diagnosis of influenza from Nasopharyngeal swab specimens and should not be used as a sole basis for treatment. Nasal washings and aspirates are unacceptable for Xpert Xpress SARS-CoV-2/FLU/RSV testing.  Fact Sheet for Patients: EntrepreneurPulse.com.au  Fact Sheet for Healthcare Providers: IncredibleEmployment.be  This test is not yet approved or cleared by the [place] FDA and has been authorized for detection and/or diagnosis of SARS-CoV-2 by FDA under an Emergency Use Authorization (EUA). This EUA will remain in effect (meaning this test can be used) for the duration of the COVID-19 declaration under Section 564(b)(1) of the Act, 21 U.S.C. section 360bbb-3(b)(1), unless the authorization is terminated or revoked.  Performed at [facility], [place] [address], [place], [place] [address]   Urine culture     Status: None   Collection Time: 07/01/20  2:16 PM   Specimen: In/Out Cath Urine  Result Value Ref Range Status   Specimen Description   Final    IN/OUT CATH URINE Performed at [facility] [address] [place], [place] [address]    Special Requests   Final    NONE Performed at [facility], [place] [address],  [place], [place] [address]    Culture   Final    NO GROWTH Performed at [facility], [place] [address] [place], [place] [address]    Report Status 07/04/2020 FINAL  Final  MRSA PCR Screening     Status: Abnormal   Collection Time: 07/01/20  4:57 PM   Specimen: Nasopharyngeal  Result Value Ref Range Status   MRSA by PCR POSITIVE (A) NEGATIVE Final    Comment:        The GeneXpert MRSA Assay (FDA approved for NASAL specimens only), is one component of a comprehensive MRSA colonization surveillance program. It is not intended to diagnose MRSA infection nor to guide or monitor treatment for MRSA infections. RESULT CALLED TO, READ BACK BY AND VERIFIED WITH: BARKSDALE,T. RN @1932  ON 04.11.2022 BY COHEN,K Performed at [facility], [place] [address] [place], [place] [address]       Radiology Studies: No results found.    Scheduled Meds: . sodium chloride   Intravenous Once  . sodium chloride   Intravenous Once  . amLODipine  5 mg Oral Daily  . Chlorhexidine Gluconate Cloth  6 each Topical Daily  . ferrous sulfate  325 mg Oral BID WC  . folic acid  1 mg Oral Daily  . insulin aspart  0-9 Units Subcutaneous TID WC  . mouth rinse  15 mL Mouth Rinse BID  . pantoprazole (PROTONIX) IV  40 mg Intravenous Q24H  . sodium chloride flush  10-40 mL Intracatheter Q12H   Continuous Infusions: .  ceFAZolin (ANCEF) IV 2 g (07/11/20 0156)     LOS: 10 days      Time spent: 25 minutes   Dessa Phi, DO [facility] 07/11/2020, 10:36 AM   Available via Epic secure chat 7am-7pm After these hours, please refer to coverage provider listed on [email or web address]

## 2020-07-12 DIAGNOSIS — T827XXD Infection and inflammatory reaction due to other cardiac and vascular devices, implants and grafts, subsequent encounter: Secondary | ICD-10-CM | POA: Diagnosis not present

## 2020-07-12 DIAGNOSIS — R7881 Bacteremia: Secondary | ICD-10-CM | POA: Diagnosis not present

## 2020-07-12 LAB — PREPARE PLATELET PHERESIS: Unit division: 0

## 2020-07-12 LAB — BASIC METABOLIC PANEL
Anion gap: 8 (ref 5–15)
BUN: 13 mg/dL (ref 6–20)
CO2: 25 mmol/L (ref 22–32)
Calcium: 7.7 mg/dL — ABNORMAL LOW (ref 8.9–10.3)
Chloride: 113 mmol/L — ABNORMAL HIGH (ref 98–111)
Creatinine, Ser: 1.87 mg/dL — ABNORMAL HIGH (ref 0.44–1.00)
GFR, Estimated: 31 mL/min — ABNORMAL LOW (ref >60)
Glucose, Bld: 96 mg/dL (ref 70–99)
Potassium: 3.5 mmol/L (ref 3.5–5.1)
Sodium: 146 mmol/L — ABNORMAL HIGH (ref 135–145)

## 2020-07-12 LAB — GLUCOSE, CAPILLARY
Glucose-Capillary: 82 mg/dL (ref 70–99)
Glucose-Capillary: 91 mg/dL (ref 70–99)
Glucose-Capillary: 96 mg/dL (ref 70–99)
Glucose-Capillary: 106 mg/dL — ABNORMAL HIGH (ref 70–99)

## 2020-07-12 LAB — BPAM PLATELET PHERESIS
Blood Product Expiration Date: 202204222359
ISSUE DATE / TIME: 202204211503
Unit Type and Rh: 7300

## 2020-07-12 LAB — MAGNESIUM: Magnesium: 1.8 mg/dL (ref 1.7–2.4)

## 2020-07-12 MED ORDER — ALUM & MAG HYDROXIDE-SIMETH 200-200-20 MG/5ML PO SUSP
15.0000 mL | ORAL | Status: DC | PRN
  Administered 2020-07-12: 30 mL via ORAL
  Filled 2020-07-12: qty 30

## 2020-07-12 MED ORDER — FENTANYL CITRATE (PF) 100 MCG/2ML IJ SOLN: 12.5000 ug | INTRAMUSCULAR | Status: DC | PRN

## 2020-07-12 NOTE — Plan of Care (Signed)
  Problem: Activity: Goal: Risk for activity intolerance will decrease Outcome: Progressing   Problem: Nutrition: Goal: Adequate nutrition will be maintained Outcome: Progressing   Problem: Coping: Goal: Level of anxiety will decrease Outcome: Progressing   

## 2020-07-12 NOTE — Progress Notes (Signed)
Brenda [name]  VZC:[id number] DOB: [birth year] DOA: 07/01/2020 PCP: Pcp, No    Brief Narrative:  60yo w/ a hx of stage IV non-small cell adenocarcinoma of the right lung currently undergoing chemotherapy who presented to the ED with epistaxis, dark stools, and generalized weakness for a few days.  At time of presentation her lactic acid was greater than 11 and her creatinine was over 5 with hyperkalemia and metabolic acidosis.  She was transfused PRBCs in the ED and while there suffered a episode of focal seizure-like activity.  Consultants:  GI Oncology ID  Code Status: FULL CODE  Antimicrobials:  Vancomycin 4/13 > 4/14 Cefazolin 4/16 >  DVT prophylaxis: SCDs  Subjective: Afebrile.  Vital signs stable.  Resting comfortably in bed.  Tells me her appetite is slowly improving.  No shortness of breath or chest pain while at rest.  Only minimal shortness of breath with exertion.  No abdominal pain.  Assessment & Plan:  Pancytopenia Felt to be a consequence of chemotherapy toxicity -status post transfusion multiple units PRBC and platelets -Granix per oncology (to continue until ANC > 1.5) -transfuse for hemoglobin less than 8 or platelet count less than 20,000 or active bleeding  Acute GI bleed Tagged RBC scan noted mild acute bleeding in the small bowel not felt to be significant enough to indicate embolization -suspected to be a minimal mucosal bleed -continue Protonix  Acute kidney failure Due to chemotherapy and severe anemia/volume depletion -renal ultrasound without acute findings - baseline creatinine 1-1.4 -recheck creatinine in a.m.  Recent Labs  Lab 07/08/20 0458 07/09/20 0449 07/10/20 0814 07/11/20 0425 07/12/20 0430  CREATININE 1.91* 1.96* 1.96* 1.77* 1.87*    Stage IV adenocarcinoma of lung Status post 2 cycles of pemetrexed and pembrolizumabm - Oncology directing care  RUE phlebitis Resolved  Isolated single seizure episode Possibly related to hypoxia -  CT head without acute findings  Coag negative staph bacteremia Noted on only 1 set of blood cultures - ID following with plan to continue cefazolin through 4/25  HTN Continue Norvasc  Hypokalemia Corrected with supplementation  Hypomagnesemia Corrected with supplementation  Family Communication:  Status is: Inpatient  Remains inpatient appropriate because:Inpatient level of care appropriate due to severity of illness   Dispo: The patient is from: Home              Anticipated d/c is to: SNF              Patient currently is not medically stable to d/c.   Difficult to place patient No     Objective: Blood pressure 127/78, pulse 99, temperature 98.1 F (36.7 C), temperature source Oral, resp. rate 20, height 5\' 6"  (1.676 m), weight 66.6 kg, last menstrual period 01/19/2011, SpO2 99 %.  Intake/Output Summary (Last 24 hours) at 07/12/2020 1027 Last data filed at 07/12/2020 0900 Gross per 24 hour  Intake 653.39 ml  Output 1125 ml  Net -471.61 ml   Filed Weights   07/09/20 0440 07/10/20 0500 07/12/20 0642  Weight: 69.6 kg 65.4 kg 66.6 kg    Examination: General: No acute respiratory distress Lungs: Clear to auscultation bilaterally without wheezes or crackles Cardiovascular: Regular rate and rhythm without murmur  Abdomen: Nontender, nondistended, soft, bowel sounds positive, no rebound, no ascites, no appreciable mass Extremities: No significant cyanosis, clubbing, or edema bilateral lower extremities  CBC: Recent Labs  Lab 07/08/20 0458 07/09/20 0449 07/09/20 2031 07/10/20 0814 07/11/20 1102  WBC 1.6* 1.4* 1.1* 1.3* 2.3*  NEUTROABS 0.4*  0.5*  --  0.5*  --   HGB 8.4* 8.3* 8.1* 7.7* 9.8*  HCT 25.2* 25.1* 24.1* 23.4* 29.5*  MCV 87.5 87.8 88.0 88.3 87.5  PLT 8* 5* 5* <5* 18*   Basic Metabolic Panel: Recent Labs  Lab 07/09/20 0449 07/10/20 0814 07/11/20 0425 07/12/20 0430  NA 141 141 145 146*  K 3.0* 2.9* 3.4* 3.5  CL 111 111 114* 113*  CO2 23 24 24 25    GLUCOSE 103* 83 99 96  BUN 23* 18 15 13   CREATININE 1.96* 1.96* 1.77* 1.87*  CALCIUM 7.3* 7.3* 7.4* 7.7*  MG 1.7  --  1.6* 1.8   GFR: Estimated Creatinine Clearance: 31.1 mL/min (A) (by C-G formula based on SCr of 1.87 mg/dL (H)).  Liver Function Tests: No results for input(s): AST, ALT, ALKPHOS, BILITOT, PROT, ALBUMIN in the last 168 hours. No results for input(s): LIPASE, AMYLASE in the last 168 hours. No results for input(s): AMMONIA in the last 168 hours.   HbA1C: Hgb A1c MFr Bld  Date/Time Value Ref Range Status  07/01/2020 02:59 PM <4.2 (L) 4.8 - 5.6 % Final    Comment:    (NOTE) **Verified by repeat analysis**         Prediabetes: 5.7 - 6.4         Diabetes: >6.4         Glycemic control for adults with diabetes: <7.0     CBG: Recent Labs  Lab 07/11/20 0836 07/11/20 1204 07/11/20 1649 07/12/20 0638 07/12/20 0759  GLUCAP 82 116* 135* 82 91     Scheduled Meds: . sodium chloride   Intravenous Once  . sodium chloride   Intravenous Once  . amLODipine  5 mg Oral Daily  . Chlorhexidine Gluconate Cloth  6 each Topical Daily  . ferrous sulfate  325 mg Oral BID WC  . folic acid  1 mg Oral Daily  . insulin aspart  0-9 Units Subcutaneous TID WC  . mouth rinse  15 mL Mouth Rinse BID  . pantoprazole (PROTONIX) IV  40 mg Intravenous Q24H  . sodium chloride flush  10-40 mL Intracatheter Q12H   Continuous Infusions: .  ceFAZolin (ANCEF) IV 2 g (07/12/20 0137)     LOS: 11 days   Cherene Altes, MD [facility] Office  [phone] Pager - Text Page per Amion  If 7PM-7AM, please contact night-coverage per Amion 07/12/2020, 10:27 AM

## 2020-07-12 NOTE — Progress Notes (Signed)
Physical Therapy Treatment Patient Details Name: Brenda [name] MRN: [id number] DOB: 1960-08-26 Today's Date: 07/12/2020    History of Present Illness 60 years old female adm with Bacteremia associated with intravascular line, ARF, acute GI bleed, pancytopenia.   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 Julien Nordmann presented to the hospital with epistaxis, dark stools and generalized weakness for few days.  CXR showed Port-A-Cath in place and minimal right pleural effusion but otherwise clear.    PT Comments    Patient reports feeling fatigued, reports up for bath. Encouraged  Patient  To sit on be dedge, perform some exercises. Patient  Did not comply, assisted with bed mobility.  Patient's progress is limited, noted patient is declining SNF and wants return to a Hotel. Currently not making progress to  Return to taking care of self. Continue PT for mobility.   Follow Up Recommendations  SNF;Other (comment) ?OPPT     Equipment Recommendations  Rolling walker with 5" wheels    Recommendations for Other Services       Precautions / Restrictions Precautions Precautions: Fall Precaution Comments: monitor VS,  decr BP, decr platelets    Mobility  Bed Mobility Overal bed mobility: Needs Assistance Bed Mobility: Rolling Rolling: Supervision         General bed mobility comments: rolled in bed to reposition, patient then declined to sit up. has been up for bath recently repsotioned in bed.    Transfers                 General transfer comment: deferred per fatigued  Ambulation/Gait                 Stairs             Wheelchair Mobility    Modified Rankin (Stroke Patients Only)       Balance                                            Cognition   Behavior During Therapy: Restless                                           Exercises      General Comments        Pertinent Vitals/Pain Faces Pain Scale: Hurts little more Pain Location: abd, peri area Pain Descriptors / Indicators: Grimacing;Burning Pain Intervention(s): Monitored during session    Home Living                      Prior Function            PT Goals (current goals can now be found in the care plan section) Progress towards PT goals: Not progressing toward goals - comment    Frequency    Min 3X/week      PT Plan Current plan remains appropriate    Co-evaluation              AM-PAC PT "6 Clicks" Mobility   Outcome Measure  Help needed turning from your back to your side while in a flat bed without using bedrails?: A Little Help needed moving from lying on your back to sitting on the side  of a flat bed without using bedrails?: A Little Help needed moving to and from a bed to a chair (including a wheelchair)?: A Lot Help needed standing up from a chair using your arms (e.g., wheelchair or bedside chair)?: A Lot Help needed to walk in hospital room?: A Lot Help needed climbing 3-5 steps with a railing? : A Lot 6 Click Score: 14    End of Session   Activity Tolerance: Patient limited by fatigue Patient left: in bed;with call bell/phone within reach;with bed alarm set;with nursing/sitter in room Nurse Communication: Mobility status PT Visit Diagnosis: Muscle weakness (generalized) (M62.81);Difficulty in walking, not elsewhere classified (R26.2)     Time: 5427-0623 PT Time Calculation (min) (ACUTE ONLY): 19 min  Charges:  $Therapeutic Activity: 8-22 mins                     Brenda [name] PT Acute Rehabilitation Services Pager [phone] Office [phone] }   Brenda [name] 07/12/2020, 4:07 PM

## 2020-07-13 DIAGNOSIS — E876 Hypokalemia: Secondary | ICD-10-CM | POA: Diagnosis not present

## 2020-07-13 DIAGNOSIS — D649 Anemia, unspecified: Secondary | ICD-10-CM | POA: Diagnosis not present

## 2020-07-13 DIAGNOSIS — N179 Acute kidney failure, unspecified: Secondary | ICD-10-CM | POA: Diagnosis not present

## 2020-07-13 DIAGNOSIS — T827XXD Infection and inflammatory reaction due to other cardiac and vascular devices, implants and grafts, subsequent encounter: Secondary | ICD-10-CM | POA: Diagnosis not present

## 2020-07-13 LAB — CBC
HCT: 28.8 % — ABNORMAL LOW (ref 36.0–46.0)
Hemoglobin: 9.3 g/dL — ABNORMAL LOW (ref 12.0–15.0)
MCH: 28.6 pg (ref 26.0–34.0)
MCHC: 32.3 g/dL (ref 30.0–36.0)
MCV: 88.6 fL (ref 80.0–100.0)
Platelets: <5 10*3/uL — CL (ref 150–400)
RBC: 3.25 MIL/uL — ABNORMAL LOW (ref 3.87–5.11)
RDW: 15.8 % — ABNORMAL HIGH (ref 11.5–15.5)
WBC: 3.2 10*3/uL — ABNORMAL LOW (ref 4.0–10.5)
nRBC: 0 % (ref 0.0–0.2)

## 2020-07-13 LAB — COMPREHENSIVE METABOLIC PANEL
ALT: <5 U/L (ref 0–44)
AST: 16 U/L (ref 15–41)
Albumin: 1.3 g/dL — ABNORMAL LOW (ref 3.5–5.0)
Alkaline Phosphatase: 111 U/L (ref 38–126)
Anion gap: 10 (ref 5–15)
BUN: 13 mg/dL (ref 6–20)
CO2: 23 mmol/L (ref 22–32)
Calcium: 7.6 mg/dL — ABNORMAL LOW (ref 8.9–10.3)
Chloride: 107 mmol/L (ref 98–111)
Creatinine, Ser: 1.84 mg/dL — ABNORMAL HIGH (ref 0.44–1.00)
GFR, Estimated: 32 mL/min — ABNORMAL LOW (ref >60)
Glucose, Bld: 103 mg/dL — ABNORMAL HIGH (ref 70–99)
Potassium: 3.2 mmol/L — ABNORMAL LOW (ref 3.5–5.1)
Sodium: 140 mmol/L (ref 135–145)
Total Bilirubin: 0.8 mg/dL (ref 0.3–1.2)
Total Protein: 4.7 g/dL — ABNORMAL LOW (ref 6.5–8.1)

## 2020-07-13 LAB — GLUCOSE, CAPILLARY
Glucose-Capillary: 101 mg/dL — ABNORMAL HIGH (ref 70–99)
Glucose-Capillary: 97 mg/dL (ref 70–99)
Glucose-Capillary: 122 mg/dL — ABNORMAL HIGH (ref 70–99)

## 2020-07-13 LAB — MAGNESIUM: Magnesium: 1.6 mg/dL — ABNORMAL LOW (ref 1.7–2.4)

## 2020-07-13 MED ORDER — SODIUM CHLORIDE 0.9% IV SOLUTION
Freq: Once | INTRAVENOUS | Status: AC
Start: 2020-07-13 — End: 2020-07-13

## 2020-07-13 MED ORDER — POTASSIUM CHLORIDE CRYS ER 20 MEQ PO TBCR
40.0000 meq | EXTENDED_RELEASE_TABLET | Freq: Two times a day (BID) | ORAL | Status: AC
  Administered 2020-07-13 (×2): 40 meq via ORAL
  Filled 2020-07-13 (×2): qty 2

## 2020-07-13 MED ORDER — SODIUM CHLORIDE 0.9% FLUSH
10.0000 mL | Freq: Two times a day (BID) | INTRAVENOUS | Status: DC
  Administered 2020-07-13 – 2020-08-02 (×9): 10 mL

## 2020-07-13 MED ORDER — MAGNESIUM SULFATE 2 GM/50ML IV SOLN
2.0000 g | Freq: Once | INTRAVENOUS | Status: AC
  Administered 2020-07-13: 2 g via INTRAVENOUS
  Filled 2020-07-13: qty 50

## 2020-07-13 MED ORDER — POTASSIUM CHLORIDE CRYS ER 20 MEQ PO TBCR: 40.0000 meq | EXTENDED_RELEASE_TABLET | Freq: Three times a day (TID) | ORAL | Status: DC

## 2020-07-13 MED ORDER — SODIUM CHLORIDE 0.9% FLUSH
10.0000 mL | INTRAVENOUS | Status: DC | PRN
  Administered 2020-07-17 – 2020-08-02 (×2): 10 mL

## 2020-07-13 NOTE — Progress Notes (Signed)
Brenda [name]  TAV:[id number] DOB: 11/28/1960 DOA: 07/01/2020 PCP: Pcp, No    Brief Narrative:  60yo w/ a hx of stage IV non-small cell adenocarcinoma of the right lung currently undergoing chemotherapy who presented to the ED with epistaxis, dark stools, and generalized weakness for a few days.  At time of presentation her lactic acid was greater than 11 and her creatinine was over 5 with hyperkalemia and metabolic acidosis.  She was transfused PRBCs in the ED and while there suffered a episode of focal seizure-like activity.  Consultants:  GI Oncology ID wound  Code Status: FULL CODE  Antimicrobials:  Vancomycin 4/13 > 4/14 Cefazolin 4/16 >  DVT prophylaxis: SCDs  Subjective: Afebrile.  Vital signs stable.  Platelet count falling again and currently less than 5.  WBC climbing and presently 3.2.  Hemoglobin appears relatively stable at 9.3. Denies cp, sob ,n/v, or abdom pain at the time of my visit. Is anxious to be able to go home.   Assessment & Plan:  Pancytopenia Felt to be a consequence of chemotherapy toxicity -status post transfusion multiple units PRBC and platelets - Granix per Oncology (to continue until ANC > 1.5) -transfuse for hemoglobin less than 8 or platelet count less than 20,000 or active bleeding - to transfuse an additional unit of plts today   Acute GI bleed Tagged RBC scan noted mild acute bleeding in the small bowel not felt to be significant enough to indicate embolization -suspected to be a minimal mucosal bleed -continue Protonix  Acute kidney failure Due to chemotherapy and severe anemia/volume depletion -renal ultrasound without acute findings - baseline creatinine 1-1.4 -creatinine appears to be holding stable at potential new baseline of approximately 1.85  Recent Labs  Lab 07/09/20 0449 07/10/20 0814 07/11/20 0425 07/12/20 0430 07/13/20 0401  CREATININE 1.96* 1.96* 1.77* 1.87* 1.84*    Stage IV non-small cell lung cancer (adenocarcinoma) -  diag May 20221 Status post 2 cycles of pemetrexed and pembrolizumabm - Oncology directing care - last dose of chemo 06/18/20  RUE phlebitis Resolved  Isolated single seizure episode Possibly related to hypoxia - CT head without acute findings  Coag negative staph bacteremia Noted on only 1 set of blood cultures - ID following with plan to continue cefazolin through 4/25  HTN Continue Norvasc  Hypokalemia Recurring - supplement further after magnesium replenished  Hypomagnesemia Recurring - supplement further and follow  Family Communication:  Status is: Inpatient  Remains inpatient appropriate because:Inpatient level of care appropriate due to severity of illness   Dispo: The patient is from: Home              Anticipated d/c is to: SNF              Patient currently is not medically stable to d/c.   Difficult to place patient No     Objective: Blood pressure 123/74, pulse (!) 103, temperature 98.5 F (36.9 C), temperature source Oral, resp. rate 20, height 5\' 6"  (1.676 m), weight 67.5 kg, last menstrual period 01/19/2011, SpO2 91 %.  Intake/Output Summary (Last 24 hours) at 07/13/2020 0926 Last data filed at 07/13/2020 0751 Gross per 24 hour  Intake 70 ml  Output 200 ml  Net -130 ml   Filed Weights   07/10/20 0500 07/12/20 0642 07/13/20 0700  Weight: 65.4 kg 66.6 kg 67.5 kg    Examination: General: No acute respiratory distress Lungs: Clear to auscultation bilaterally - no wheezing or crackles  Cardiovascular: mild tachycardia - no M  or rub  Abdomen: NT/ND, soft, bs+, no mass  Extremities: No signif edema bilateral lower extremities  CBC: Recent Labs  Lab 07/08/20 0458 07/09/20 0449 07/09/20 2031 07/10/20 0814 07/11/20 1102 07/13/20 0401  WBC 1.6* 1.4*   < > 1.3* 2.3* 3.2*  NEUTROABS 0.4* 0.5*  --  0.5*  --   --   HGB 8.4* 8.3*   < > 7.7* 9.8* 9.3*  HCT 25.2* 25.1*   < > 23.4* 29.5* 28.8*  MCV 87.5 87.8   < > 88.3 87.5 88.6  PLT 8* 5*   < > <5*  18* <5*   < > = values in this interval not displayed.   Basic Metabolic Panel: Recent Labs  Lab 07/11/20 0425 07/12/20 0430 07/13/20 0401  NA 145 146* 140  K 3.4* 3.5 3.2*  CL 114* 113* 107  CO2 24 25 23   GLUCOSE 99 96 103*  BUN 15 13 13   CREATININE 1.77* 1.87* 1.84*  CALCIUM 7.4* 7.7* 7.6*  MG 1.6* 1.8 1.6*   GFR: Estimated Creatinine Clearance: 31.6 mL/min (A) (by C-G formula based on SCr of 1.84 mg/dL (H)).  Liver Function Tests: Recent Labs  Lab 07/13/20 0401  AST 16  ALT <5  ALKPHOS 111  BILITOT 0.8  PROT 4.7*  ALBUMIN 1.3*     HbA1C: Hgb A1c MFr Bld  Date/Time Value Ref Range Status  07/01/2020 02:59 PM <4.2 (L) 4.8 - 5.6 % Final    Comment:    (NOTE) **Verified by repeat analysis**         Prediabetes: 5.7 - 6.4         Diabetes: >6.4         Glycemic control for adults with diabetes: <7.0     CBG: Recent Labs  Lab 07/12/20 0638 07/12/20 0759 07/12/20 1154 07/12/20 1637 07/13/20 0903  GLUCAP 82 91 96 106* 101*     Scheduled Meds: . Chlorhexidine Gluconate Cloth  6 each Topical Daily  . ferrous sulfate  325 mg Oral BID WC  . folic acid  1 mg Oral Daily  . insulin aspart  0-9 Units Subcutaneous TID WC  . mouth rinse  15 mL Mouth Rinse BID  . pantoprazole (PROTONIX) IV  40 mg Intravenous Q24H  . sodium chloride flush  10-40 mL Intracatheter Q12H  . sodium chloride flush  10-40 mL Intracatheter Q12H   Continuous Infusions: .  ceFAZolin (ANCEF) IV 2 g (07/13/20 0132)     LOS: 12 days   Cherene Altes, MD [facility] Office  [phone] Pager - Text Page per Amion  If 7PM-7AM, please contact night-coverage per Amion 07/13/2020, 9:26 AM

## 2020-07-13 NOTE — Progress Notes (Addendum)
Date and time results received: 07/13/20 0528  Test: Lab  Critical Value: Platelets less than 5  Name of Provider Notified: X. Blount  Orders Received? Or Actions Taken?:  No new orders placed at this time.

## 2020-07-13 NOTE — Progress Notes (Signed)
Occupational Therapy Treatment Patient Details Name: Brenda [name] MRN: [id number] DOB: 1960-04-15 Today's Date: 07/13/2020    History of present illness 60 years old female adm with Bacteremia associated with intravascular line, ARF, acute GI bleed, pancytopenia.   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 Julien Nordmann presented to the hospital with epistaxis, dark stools and generalized weakness for few days.  CXR showed Port-A-Cath in place and minimal right pleural effusion but otherwise clear.   OT comments  Treatment limited by patient's complaints of light headedness with sitting up. BP monitored and as follows: 107/59 in supine before activity, 101/89, unable to stand, and 118//65 on return to supine. Patient on 2 L [place] at 99%. Removed [place] and sat 96% in supine. o2 sat maintained between 90-92% with patient sitting at side of bed. Patient exhibited some dyspnea and provided with verbal cues for improved breathing techniques. Patient tolerated sitting at side of bed while vitals are monitored and despite extensive encouragement and education in regards to need physiologica need for upright positiong and activity patient returned herself to supine. Patient stated "I'll get my brother to help me walk around the apartment." Therapist asked patient "how are you going to walk around an apartment when you can't up to a recliner?" Patient exhibits poor insight into deficits. Continue to recommend short term rehab at discharge.   Follow Up Recommendations  SNF    Equipment Recommendations  3 in 1 bedside commode    Recommendations for Other Services      Precautions / Restrictions Precautions Precautions: Fall Precaution Comments: monitor VS,  decr BP, decr platelets Restrictions Weight Bearing Restrictions: No       Mobility Bed Mobility Overal bed mobility: Needs Assistance Bed Mobility: Supine to  Sit;Sit to Supine     Supine to sit: Min assist Sit to supine: Supervision   General bed mobility comments: Min assist for hand hold to to pull patient up into sitting. Patient put herself back into bed.    Transfers                      Balance Overall balance assessment: Needs assistance Sitting-balance support: Feet supported Sitting balance-Leahy Scale: Fair                                     ADL either performed or assessed with clinical judgement   ADL                                               Vision Patient Visual Report: No change from baseline     Perception     Praxis      Cognition Arousal/Alertness: Awake/alert Behavior During Therapy: WFL for tasks assessed/performed Overall Cognitive Status: Within Functional Limits for tasks assessed                                          Exercises     Shoulder Instructions       General Comments      Pertinent Vitals/ Pain       Pain Assessment: Faces Faces Pain  Scale: Hurts little more Pain Location: peri area Pain Descriptors / Indicators: Grimacing;Burning Pain Intervention(s): Monitored during session  Home Living                                          Prior Functioning/Environment              Frequency  Min 2X/week        Progress Toward Goals  OT Goals(current goals can now be found in the care plan section)  Progress towards OT goals: Progressing toward goals  Acute Rehab OT Goals Patient Stated Goal: get stronger OT Goal Formulation: With patient Time For Goal Achievement: 07/24/20 Potential to Achieve Goals: [place] Discharge plan remains appropriate    Co-evaluation                 AM-PAC OT "6 Clicks" Daily Activity     Outcome Measure   Help from another person eating meals?: None Help from another person taking care of personal grooming?: A Little Help from another  person toileting, which includes using toliet, bedpan, or urinal?: A Lot Help from another person bathing (including washing, rinsing, drying)?: A Lot Help from another person to put on and taking off regular upper body clothing?: A Little Help from another person to put on and taking off regular lower body clothing?: Total 6 Click Score: 15    End of Session    OT Visit Diagnosis: Unsteadiness on feet (R26.81);Muscle weakness (generalized) (M62.81);Pain   Activity Tolerance Patient limited by fatigue;Other (comment) (dizziness)   Patient Left     Nurse Communication Mobility status        Time: 1510-1535 OT Time Calculation (min): 25 min  Charges: OT General Charges $OT Visit: 1 Visit OT Treatments $Therapeutic Activity: 8-22 mins  Ashwini Jago, OTR/L [place]  Office [phone] Pager: [place] 07/13/2020, 3:48 PM

## 2020-07-13 NOTE — Plan of Care (Signed)
  Problem: Activity: Goal: Risk for activity intolerance will decrease Outcome: Progressing   Problem: Nutrition: Goal: Adequate nutrition will be maintained Outcome: Progressing   Problem: Coping: Goal: Level of anxiety will decrease Outcome: Progressing   Problem: Pain Managment: Goal: General experience of comfort will improve Outcome: Progressing   Problem: Safety: Goal: Ability to remain free from injury will improve Outcome: Progressing   

## 2020-07-14 DIAGNOSIS — E876 Hypokalemia: Secondary | ICD-10-CM | POA: Diagnosis not present

## 2020-07-14 DIAGNOSIS — T827XXD Infection and inflammatory reaction due to other cardiac and vascular devices, implants and grafts, subsequent encounter: Secondary | ICD-10-CM | POA: Diagnosis not present

## 2020-07-14 DIAGNOSIS — N179 Acute kidney failure, unspecified: Secondary | ICD-10-CM | POA: Diagnosis not present

## 2020-07-14 DIAGNOSIS — D61818 Other pancytopenia: Secondary | ICD-10-CM | POA: Diagnosis not present

## 2020-07-14 LAB — CBC WITH DIFFERENTIAL/PLATELET
Abs Immature Granulocytes: NONE SEEN 10*3/uL (ref 0.00–0.07)
Band Neutrophils: 2 %
Basophils Relative: 0 %
Blasts: NONE SEEN %
Eosinophils Relative: 0 %
HCT: 27.3 % — ABNORMAL LOW (ref 36.0–46.0)
Hemoglobin: 9.1 g/dL — ABNORMAL LOW (ref 12.0–15.0)
Immature Granulocytes: NONE SEEN %
Lymphocytes Relative: 20 %
MCH: 29.2 pg (ref 26.0–34.0)
MCHC: 33.3 g/dL (ref 30.0–36.0)
MCV: 87.5 fL (ref 80.0–100.0)
Metamyelocytes Relative: NONE SEEN %
Monocytes Relative: 8 %
Myelocytes: NONE SEEN %
Neutrophils Relative %: 70 %
Platelets: <5 10*3/uL — CL (ref 150–400)
Promyelocytes Relative: NONE SEEN %
RBC Morphology: NORMAL
RBC: 3.12 MIL/uL — ABNORMAL LOW (ref 3.87–5.11)
RDW: 15.7 % — ABNORMAL HIGH (ref 11.5–15.5)
WBC Morphology: NORMAL
WBC: 3.6 10*3/uL — ABNORMAL LOW (ref 4.0–10.5)
nRBC: 0 % (ref 0.0–0.2)
nRBC: NONE SEEN /100 WBC

## 2020-07-14 LAB — COMPREHENSIVE METABOLIC PANEL
ALT: <5 U/L (ref 0–44)
AST: 17 U/L (ref 15–41)
Albumin: 1.3 g/dL — ABNORMAL LOW (ref 3.5–5.0)
Alkaline Phosphatase: 122 U/L (ref 38–126)
Anion gap: 9 (ref 5–15)
BUN: 13 mg/dL (ref 6–20)
CO2: 23 mmol/L (ref 22–32)
Calcium: 7.6 mg/dL — ABNORMAL LOW (ref 8.9–10.3)
Chloride: 111 mmol/L (ref 98–111)
Creatinine, Ser: 1.93 mg/dL — ABNORMAL HIGH (ref 0.44–1.00)
GFR, Estimated: 30 mL/min — ABNORMAL LOW (ref >60)
Glucose, Bld: 108 mg/dL — ABNORMAL HIGH (ref 70–99)
Potassium: 3.6 mmol/L (ref 3.5–5.1)
Sodium: 143 mmol/L (ref 135–145)
Total Bilirubin: 0.4 mg/dL (ref 0.3–1.2)
Total Protein: 4.7 g/dL — ABNORMAL LOW (ref 6.5–8.1)

## 2020-07-14 LAB — BPAM PLATELET PHERESIS
Blood Product Expiration Date: 202204252359
ISSUE DATE / TIME: 202204231337
Unit Type and Rh: 5100

## 2020-07-14 LAB — PREPARE PLATELET PHERESIS: Unit division: 0

## 2020-07-14 LAB — GLUCOSE, CAPILLARY
Glucose-Capillary: 97 mg/dL (ref 70–99)
Glucose-Capillary: 104 mg/dL — ABNORMAL HIGH (ref 70–99)
Glucose-Capillary: 97 mg/dL (ref 70–99)

## 2020-07-14 LAB — MAGNESIUM: Magnesium: 1.8 mg/dL (ref 1.7–2.4)

## 2020-07-14 MED ORDER — PANTOPRAZOLE SODIUM 40 MG PO TBEC
40.0000 mg | DELAYED_RELEASE_TABLET | Freq: Two times a day (BID) | ORAL | Status: DC
  Administered 2020-07-14 – 2020-08-02 (×39): 40 mg via ORAL
  Filled 2020-07-14 (×39): qty 1

## 2020-07-14 MED ORDER — TRAMADOL HCL 50 MG PO TABS
50.0000 mg | ORAL_TABLET | Freq: Four times a day (QID) | ORAL | Status: DC | PRN
  Administered 2020-07-15 – 2020-07-30 (×9): 50 mg via ORAL
  Filled 2020-07-14 (×10): qty 1

## 2020-07-14 MED ORDER — SODIUM CHLORIDE 0.9% IV SOLUTION: Freq: Once | INTRAVENOUS | Status: AC

## 2020-07-14 NOTE — Progress Notes (Signed)
Brenda [name]  EVO:[id number] DOB: 10-Dec-1960 DOA: 07/01/2020 PCP: Pcp, No    Brief Narrative:  60yo w/ a hx of stage IV non-small cell adenocarcinoma of the right lung currently undergoing chemotherapy who presented to the ED with epistaxis, dark stools, and generalized weakness for a few days.  At time of presentation her lactic acid was greater than 11 and her creatinine was over 5 with hyperkalemia and metabolic acidosis.  She was transfused PRBCs in the ED and while there suffered a episode of focal seizure-like activity.  Consultants:  GI Oncology ID wound  Code Status: FULL CODE  Antimicrobials:  Vancomycin 4/13 > 4/14 Cefazolin 4/16 >  DVT prophylaxis: SCDs  Subjective: Afebrile.  Vital signs stable.  Saturation 96% on room air.  Platelet count did not improve appreciably with 1 unit of pheresis platelets yesterday.  No new complaints today.  Denies any known gross bleeding.  Denies shortness of breath chest pain or abdominal pain.  Assessment & Plan:  Pancytopenia Felt to be a consequence of chemotherapy toxicity - status post transfusion multiple units PRBC and platelets - Granix dosing has been completed per Oncology - transfuse for hemoglobin less than 8 or platelet count less than 20,000 or active bleeding - to transfuse an additional unit of plts again today   Acute GI bleed Tagged RBC scan noted mild acute bleeding in the small bowel not felt to be significant enough to indicate embolization -suspected to be a minimal mucosal bleed -continue Protonix -hemoglobin holding relatively steady  Recent Labs  Lab 07/09/20 2031 07/10/20 0814 07/11/20 1102 07/13/20 0401 07/14/20 0353  HGB 8.1* 7.7* 9.8* 9.3* 9.1*     Acute kidney failure Due to chemotherapy and severe anemia/volume depletion -renal ultrasound without acute findings - baseline creatinine 1-1.4 -creatinine appears to be holding stable at potential new baseline of approximately 1.85 -continue to  follow  Recent Labs  Lab 07/10/20 0814 07/11/20 0425 07/12/20 0430 07/13/20 0401 07/14/20 0353  CREATININE 1.96* 1.77* 1.87* 1.84* 1.93*    Stage IV non-small cell lung cancer (adenocarcinoma) - diag May 20221 Status post 2 cycles of pemetrexed and pembrolizumabm - Oncology directing care - last dose of chemo 06/18/20  RUE phlebitis Resolved  Isolated single seizure episode Possibly related to hypoxia - CT head without acute findings  Coag negative staph bacteremia Noted on only 1 set of blood cultures - ID following with plan to continue cefazolin through 4/25  HTN Blood pressure well controlled and at times trending downward -stop blood pressure meds and follow  Hypokalemia Improved with supplementation -continue to follow trend  Hypomagnesemia Improved with supplementation -continue to follow trend  Family Communication:  Status is: Inpatient  Remains inpatient appropriate because:Inpatient level of care appropriate due to severity of illness   Dispo: The patient is from: Home              Anticipated d/c is to: SNF              Patient currently is not medically stable to d/c.   Difficult to place patient No     Objective: Blood pressure 99/69, pulse 99, temperature 98.4 F (36.9 C), temperature source Oral, resp. rate 18, height 5\' 6"  (1.676 m), weight 69.8 kg, last menstrual period 01/19/2011, SpO2 99 %.  Intake/Output Summary (Last 24 hours) at 07/14/2020 0801 Last data filed at 07/14/2020 0554 Gross per 24 hour  Intake 934 ml  Output 1350 ml  Net -416 ml   [employer]  07/12/20 0642 07/13/20 0700 07/14/20 0500  Weight: 66.6 kg 67.5 kg 69.8 kg    Examination: General: No acute respiratory distress Lungs: Clear to auscultation bilaterally without wheezing or crackles Cardiovascular: mild tachycardia without murmur or rub Abdomen: NT/ND, soft, bs+, no mass  Extremities: No edema bilateral lower extremities  CBC: Recent Labs  Lab  07/08/20 0458 07/09/20 0449 07/09/20 2031 07/10/20 0814 07/11/20 1102 07/13/20 0401 07/14/20 0353  WBC 1.6* 1.4*   < > 1.3* 2.3* 3.2* 3.6*  NEUTROABS 0.4* 0.5*  --  0.5*  --   --   --   HGB 8.4* 8.3*   < > 7.7* 9.8* 9.3* 9.1*  HCT 25.2* 25.1*   < > 23.4* 29.5* 28.8* 27.3*  MCV 87.5 87.8   < > 88.3 87.5 88.6 87.5  PLT 8* 5*   < > <5* 18* <5* <5*   < > = values in this interval not displayed.   Basic Metabolic Panel: Recent Labs  Lab 07/12/20 0430 07/13/20 0401 07/14/20 0353  NA 146* 140 143  K 3.5 3.2* 3.6  CL 113* 107 111  CO2 25 23 23   GLUCOSE 96 103* 108*  BUN 13 13 13   CREATININE 1.87* 1.84* 1.93*  CALCIUM 7.7* 7.6* 7.6*  MG 1.8 1.6* 1.8   GFR: Estimated Creatinine Clearance: 30.1 mL/min (A) (by C-G formula based on SCr of 1.93 mg/dL (H)).  Liver Function Tests: Recent Labs  Lab 07/13/20 0401 07/14/20 0353  AST 16 17  ALT <5 <5  ALKPHOS 111 122  BILITOT 0.8 0.4  PROT 4.7* 4.7*  ALBUMIN 1.3* 1.3*     HbA1C: Hgb A1c MFr Bld  Date/Time Value Ref Range Status  07/01/2020 02:59 PM <4.2 (L) 4.8 - 5.6 % Final    Comment:    (NOTE) **Verified by repeat analysis**         Prediabetes: 5.7 - 6.4         Diabetes: >6.4         Glycemic control for adults with diabetes: <7.0     CBG: Recent Labs  Lab 07/12/20 1637 07/13/20 0903 07/13/20 1227 07/13/20 1659 07/14/20 0740  GLUCAP 106* 101* 97 122* 97     Scheduled Meds: . Chlorhexidine Gluconate Cloth  6 each Topical Daily  . ferrous sulfate  325 mg Oral BID WC  . folic acid  1 mg Oral Daily  . insulin aspart  0-9 Units Subcutaneous TID WC  . mouth rinse  15 mL Mouth Rinse BID  . pantoprazole (PROTONIX) IV  40 mg Intravenous Q24H  . sodium chloride flush  10-40 mL Intracatheter Q12H   Continuous Infusions: .  ceFAZolin (ANCEF) IV 2 g (07/14/20 0129)     LOS: 13 days   Cherene Altes, MD [facility] Office  [phone] Pager - Text Page per Amion  If 7PM-7AM, please contact  night-coverage per Amion 07/14/2020, 8:01 AM

## 2020-07-15 DIAGNOSIS — K922 Gastrointestinal hemorrhage, unspecified: Secondary | ICD-10-CM | POA: Diagnosis not present

## 2020-07-15 DIAGNOSIS — T827XXD Infection and inflammatory reaction due to other cardiac and vascular devices, implants and grafts, subsequent encounter: Secondary | ICD-10-CM | POA: Diagnosis not present

## 2020-07-15 DIAGNOSIS — D61818 Other pancytopenia: Secondary | ICD-10-CM | POA: Diagnosis not present

## 2020-07-15 DIAGNOSIS — C3411 Malignant neoplasm of upper lobe, right bronchus or lung: Secondary | ICD-10-CM | POA: Diagnosis not present

## 2020-07-15 DIAGNOSIS — N179 Acute kidney failure, unspecified: Secondary | ICD-10-CM | POA: Diagnosis not present

## 2020-07-15 DIAGNOSIS — E876 Hypokalemia: Secondary | ICD-10-CM | POA: Diagnosis not present

## 2020-07-15 DIAGNOSIS — D649 Anemia, unspecified: Secondary | ICD-10-CM | POA: Diagnosis not present

## 2020-07-15 LAB — CBC WITH DIFFERENTIAL/PLATELET
Abs Immature Granulocytes: 0.16 10*3/uL — ABNORMAL HIGH (ref 0.00–0.07)
Basophils Absolute: 0 10*3/uL (ref 0.0–0.1)
Basophils Relative: 0 %
Eosinophils Absolute: 0 10*3/uL (ref 0.0–0.5)
Eosinophils Relative: 0 %
HCT: 27.1 % — ABNORMAL LOW (ref 36.0–46.0)
HCT: 27.1 % — ABNORMAL LOW (ref 36.0–46.0)
Hemoglobin: 8.9 g/dL — ABNORMAL LOW (ref 12.0–15.0)
Hemoglobin: 8.9 g/dL — ABNORMAL LOW (ref 12.0–15.0)
Immature Granulocytes: 6 %
Lymphocytes Relative: 33 %
Lymphs Abs: 0.8 10*3/uL (ref 0.7–4.0)
MCH: 29.1 pg (ref 26.0–34.0)
MCH: 29.2 pg (ref 26.0–34.0)
MCHC: 32.8 g/dL (ref 30.0–36.0)
MCHC: 32.8 g/dL (ref 30.0–36.0)
MCV: 88.6 fL (ref 80.0–100.0)
MCV: 88.9 fL (ref 80.0–100.0)
Monocytes Absolute: 0.4 10*3/uL (ref 0.1–1.0)
Monocytes Relative: 14 %
Neutro Abs: 1.2 10*3/uL — ABNORMAL LOW (ref 1.7–7.7)
Neutrophils Relative %: 47 %
Platelets: <5 10*3/uL — CL (ref 150–400)
Platelets: <5 10*3/uL — CL (ref 150–400)
RBC: 3.05 MIL/uL — ABNORMAL LOW (ref 3.87–5.11)
RBC: 3.06 MIL/uL — ABNORMAL LOW (ref 3.87–5.11)
RDW: 15.9 % — ABNORMAL HIGH (ref 11.5–15.5)
RDW: 15.9 % — ABNORMAL HIGH (ref 11.5–15.5)
WBC: 2.5 10*3/uL — ABNORMAL LOW (ref 4.0–10.5)
WBC: 2.6 10*3/uL — ABNORMAL LOW (ref 4.0–10.5)
nRBC: 0 % (ref 0.0–0.2)
nRBC: 0 % (ref 0.0–0.2)

## 2020-07-15 LAB — BPAM PLATELET PHERESIS
Blood Product Expiration Date: 202204242359
ISSUE DATE / TIME: 202204241015
Unit Type and Rh: 6200

## 2020-07-15 LAB — RENAL FUNCTION PANEL
Albumin: 1.5 g/dL — ABNORMAL LOW (ref 3.5–5.0)
Anion gap: 6 (ref 5–15)
BUN: 14 mg/dL (ref 6–20)
CO2: 24 mmol/L (ref 22–32)
Calcium: 7.6 mg/dL — ABNORMAL LOW (ref 8.9–10.3)
Chloride: 111 mmol/L (ref 98–111)
Creatinine, Ser: 2.13 mg/dL — ABNORMAL HIGH (ref 0.44–1.00)
GFR, Estimated: 27 mL/min — ABNORMAL LOW (ref >60)
Glucose, Bld: 111 mg/dL — ABNORMAL HIGH (ref 70–99)
Phosphorus: 2.6 mg/dL (ref 2.5–4.6)
Potassium: 3.3 mmol/L — ABNORMAL LOW (ref 3.5–5.1)
Sodium: 141 mmol/L (ref 135–145)

## 2020-07-15 LAB — PREPARE PLATELET PHERESIS: Unit division: 0

## 2020-07-15 LAB — MAGNESIUM: Magnesium: 1.6 mg/dL — ABNORMAL LOW (ref 1.7–2.4)

## 2020-07-15 LAB — GLUCOSE, CAPILLARY
Glucose-Capillary: 96 mg/dL (ref 70–99)
Glucose-Capillary: 122 mg/dL — ABNORMAL HIGH (ref 70–99)
Glucose-Capillary: 129 mg/dL — ABNORMAL HIGH (ref 70–99)

## 2020-07-15 MED ORDER — MAGNESIUM SULFATE 2 GM/50ML IV SOLN
2.0000 g | Freq: Once | INTRAVENOUS | Status: AC
  Administered 2020-07-15: 2 g via INTRAVENOUS
  Filled 2020-07-15: qty 50

## 2020-07-15 MED ORDER — POTASSIUM CHLORIDE CRYS ER 20 MEQ PO TBCR
40.0000 meq | EXTENDED_RELEASE_TABLET | Freq: Two times a day (BID) | ORAL | Status: AC
  Administered 2020-07-15 (×2): 40 meq via ORAL
  Filled 2020-07-15 (×2): qty 2

## 2020-07-15 MED ORDER — SODIUM CHLORIDE 0.9 % IV SOLN: INTRAVENOUS | Status: DC

## 2020-07-15 MED ORDER — DEXAMETHASONE SODIUM PHOSPHATE 10 MG/ML IJ SOLN
20.0000 mg | INTRAMUSCULAR | Status: DC
  Administered 2020-07-15 – 2020-07-21 (×7): 20 mg via INTRAVENOUS
  Filled 2020-07-15 (×7): qty 2

## 2020-07-15 MED ORDER — SODIUM CHLORIDE 0.9% IV SOLUTION: Freq: Once | INTRAVENOUS | Status: AC

## 2020-07-15 NOTE — Progress Notes (Signed)
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  At time of presentation her lactic acid was greater than 11 and her creatinine was over 5 with hyperkalemia and metabolic acidosis.  She was transfused PRBCs in the ED and while there suffered a episode of focal seizure-like activity.  Consultants:  GI Oncology ID wound  Code Status: FULL CODE  Antimicrobials:  Vancomycin 4/13 > 4/14 Cefazolin 4/16 >  DVT prophylaxis: SCDs  Subjective: Afebrile.  Vital signs stable.  Mildly tachycardic at 105.  Appears to be resting comfortably in bed.  Denies any new complaints.  Tells me she is anxious to go home as soon as she can.  Specifically denies chest pain abdominal pain nausea or vomiting.  Assessment & Plan:  Pancytopenia Felt to be a consequence of chemotherapy toxicity - status post transfusion multiple units PRBC and platelets - Granix dosing has been completed per Oncology - transfuse for hemoglobin less than 8 or platelet count less than 20,000 or active bleeding - see below   Thrombocytopenia As noted above - etiology of this cell line decline not as clear as apparently thrombocytopenia not a usual response to her chemotherapy regimen - despite ongoing transfusion support over the last 2-3 days, her numbers are not improving - cont to transfuse - await further recs from Hematology - perhaps further evaluation is indicated (?bone marrow, etc.)  Recent Labs  Lab 07/10/20 0814 07/11/20 1102 07/13/20 0401 07/14/20 0353 07/15/20 0330  PLT <5* 18* <5* <5* <5*  <5*    Neutropenia ANC signif improved today following Granix dosing per Heme/Onc  Acute GI bleed Tagged RBC scan noted mild acute bleeding in the small bowel not felt to be  significant enough to indicate embolization -suspected to be a minimal mucosal bleed -continue Protonix -hemoglobin holding relatively steady  Recent Labs  Lab 07/10/20 0814 07/11/20 1102 07/13/20 0401 07/14/20 0353 07/15/20 0330  HGB 7.7* 9.8* 9.3* 9.1* 8.9*  8.9*    Acute kidney failure Due to chemotherapy and severe anemia/volume depletion -renal ultrasound without acute findings - baseline creatinine 1-1.4 -creatinine trending upward today -hydrate and continue to follow  Recent Labs  Lab 07/11/20 0425 07/12/20 0430 07/13/20 0401 07/14/20 0353 07/15/20 0330  CREATININE 1.77* 1.87* 1.84* 1.93* 2.13*    Stage IV non-small cell lung cancer (adenocarcinoma) - diag May 20221 Status post 2 cycles of pemetrexed and pembrolizumabm - Oncology directing care - last dose of chemo 06/18/20  RUE phlebitis Resolved  Isolated single seizure episode Possibly related to hypoxia - CT head without acute findings  Coag negative staph bacteremia Noted on only 1 set of blood cultures - ID following with plan to continue cefazolin through 4/25  HTN Blood pressure well controlled and at times trending downward -stopped blood pressure meds - following  Hypokalemia Continue supplementation and follow trend  Hypomagnesemia Recurring - cont supplementation and follow trend   Family Communication:  Status is: Inpatient  Remains inpatient appropriate because:Inpatient level of care appropriate due to severity of illness   Dispo: The patient is from: Home              Anticipated d/c is to: SNF              Patient currently  is not medically stable to d/c.   Difficult to place patient No     Objective: Blood pressure 102/67, pulse (!) 102, temperature 98.7 F (37.1 C), temperature source Oral, resp. rate 19, height 5\' 6"  (1.676 m), weight 69.8 kg, last menstrual period 01/19/2011, SpO2 100 %.  Intake/Output Summary (Last 24 hours) at 07/15/2020 1538 Last data filed at 07/15/2020  1214 Gross per 24 hour  Intake 2176 ml  Output 1400 ml  Net 776 ml   Filed Weights   07/12/20 0642 07/13/20 0700 07/14/20 0500  Weight: 66.6 kg 67.5 kg 69.8 kg    Examination: General: No acute respiratory distress Lungs: CTA B without wheezing or crackles Cardiovascular: mild tachycardia without murmur or rub Abdomen: NT/ND, soft, bs+, no mass  Extremities: No edema bilateral LE  CBC: Recent Labs  Lab 07/09/20 0449 07/09/20 2031 07/10/20 0814 07/11/20 1102 07/13/20 0401 07/14/20 0353 07/15/20 0330  WBC 1.4*   < > 1.3*   < > 3.2* 3.6* 2.6*  2.5*  NEUTROABS 0.5*  --  0.5*  --   --   --  1.2*  HGB 8.3*   < > 7.7*   < > 9.3* 9.1* 8.9*  8.9*  HCT 25.1*   < > 23.4*   < > 28.8* 27.3* 27.1*  27.1*  MCV 87.8   < > 88.3   < > 88.6 87.5 88.6  88.9  PLT 5*   < > <5*   < > <5* <5* <5*  <5*   < > = values in this interval not displayed.   Basic Metabolic Panel: Recent Labs  Lab 07/13/20 0401 07/14/20 0353 07/15/20 0330  NA 140 143 141  K 3.2* 3.6 3.3*  CL 107 111 111  CO2 23 23 24   GLUCOSE 103* 108* 111*  BUN 13 13 14   CREATININE 1.84* 1.93* 2.13*  CALCIUM 7.6* 7.6* 7.6*  MG 1.6* 1.8 1.6*  PHOS  --   --  2.6   GFR: Estimated Creatinine Clearance: 27.3 mL/min (A) (by C-G formula based on SCr of 2.13 mg/dL (H)).  Liver Function Tests: Recent Labs  Lab 07/13/20 0401 07/14/20 0353 07/15/20 0330  AST 16 17  --   ALT <5 <5  --   ALKPHOS 111 122  --   BILITOT 0.8 0.4  --   PROT 4.7* 4.7*  --   ALBUMIN 1.3* 1.3* 1.5*     HbA1C: Hgb A1c MFr Bld  Date/Time Value Ref Range Status  07/01/2020 02:59 PM <4.2 (L) 4.8 - 5.6 % Final    Comment:    (NOTE) **Verified by repeat analysis**         Prediabetes: 5.7 - 6.4         Diabetes: >6.4         Glycemic control for adults with diabetes: <7.0     CBG: Recent Labs  Lab 07/14/20 0740 07/14/20 1236 07/14/20 1704 07/15/20 0733 07/15/20 1120  GLUCAP 97 104* 97 96 122*     Scheduled Meds: .  Chlorhexidine Gluconate Cloth  6 each Topical Daily  . dexamethasone (DECADRON) injection  20 mg Intravenous Q24H  . ferrous sulfate  325 mg Oral BID WC  . folic acid  1 mg Oral Daily  . insulin aspart  0-9 Units Subcutaneous TID WC  . mouth rinse  15 mL Mouth Rinse BID  . pantoprazole  40 mg Oral BID  . potassium chloride  40 mEq Oral BID  . sodium chloride flush  10-40 mL Intracatheter Q12H   Continuous Infusions: . sodium chloride 75 mL/hr at 07/15/20 0915     LOS: 14 days   Cherene Altes, MD [facility] Office  [phone] Pager - Text Page per Shea Evans  If 7PM-7AM, please contact night-coverage per Amion 07/15/2020, 3:38 PM

## 2020-07-15 NOTE — Plan of Care (Signed)
  Problem: Activity: Goal: Risk for activity intolerance will decrease Outcome: Progressing   Problem: Pain Managment: Goal: General experience of comfort will improve Outcome: Progressing   Problem: Safety: Goal: Ability to remain free from injury will improve Outcome: Progressing   

## 2020-07-15 NOTE — Progress Notes (Signed)
HEMATOLOGY-ONCOLOGY PROGRESS NOTE  SUBJECTIVE: The patient is no longer having GI bleeding.  She continues to have persistent thrombocytopenia and requires platelet transfusions.  No obvious bleeding noted.  Oncology History  Malignant neoplasm of right lung ([place])  09/14/2019 Initial Diagnosis   Adenocarcinoma of right lung, stage 4 ([place])   09/28/2019 -  Chemotherapy    Patient is on Treatment Plan: LUNG CARBOPLATIN / PEMETREXED / PEMBROLIZUMAB Q21D INDUCTION X 4 CYCLES / MAINTENANCE PEMETREXED + PEMBROLIZUMAB      04/17/2020 Cancer Staging   Staging form: Lung, AJCC 8th Edition - Clinical: Stage IVA (cT2a, cN2, cM1b) - Signed by Curt Bears, MD on 04/17/2020      REVIEW OF SYSTEMS:   Constitutional: Denies fevers, chills Eyes: Denies blurriness of vision Ears, nose, mouth, throat, and face: Denies mucositis or sore throat Respiratory: Denies cough, dyspnea or wheezes Cardiovascular: Denies palpitation, chest discomfort Gastrointestinal: Denies nausea and vomiting.  No recurrent GI bleeding. Skin: Denies abnormal skin rashes Lymphatics: Denies new lymphadenopathy or easy bruising Neurological:Denies numbness, tingling or new weaknesses Behavioral/Psych: Mood is stable, no new changes  Extremities: No lower extremity edema All other systems were reviewed with the patient and are negative.  I have reviewed the past medical history, past surgical history, social history and family history with the patient and they are unchanged from previous note.   PHYSICAL EXAMINATION: ECOG PERFORMANCE STATUS: 2 - Symptomatic, <50% confined to bed  Vitals:   07/15/20 1322 07/15/20 1342  BP: 107/73 102/67  Pulse: (!) 107 (!) 102  Resp: 19 19  Temp: 97.6 F (36.4 C) 98.7 F (37.1 C)  SpO2:  100%   Filed Weights   07/12/20 0642 07/13/20 0700 07/14/20 0500  Weight: 66.6 kg 67.5 kg 69.8 kg    Intake/Output from previous day: 04/24 0701 - 04/25 0700 In: 1877 [P.O.:1037; I.V.:10;  Blood:830] Out: 1700 [Urine:1700]  GENERAL:alert, no distress and comfortable SKIN: skin color, texture, turgor are normal, no rashes or significant lesions EYES: normal, Conjunctiva are pink and non-injected, sclera clear OROPHARYNX:no exudate, no erythema and lips, buccal mucosa, and tongue normal  LUNGS: clear to auscultation and percussion with normal breathing effort HEART: regular rate & rhythm and no murmurs and no lower extremity edema ABDOMEN:abdomen soft, non-tender and normal bowel sounds NEURO: alert & oriented x 3 with fluent speech, no focal motor/sensory deficits  LABORATORY DATA:  I have reviewed the data as listed CMP Latest Ref Rng & Units 07/15/2020 07/14/2020 07/13/2020  Glucose 70 - 99 mg/dL 111(H) 108(H) 103(H)  BUN 6 - 20 mg/dL _0 Creatinine 0.44 - 1.00 mg/dL 2.13(H) 1.93(H) 1.84(H)  Sodium 135 - 145 mmol/L 141 143 140  Potassium 3.5 - 5.1 mmol/L 3.3(L) 3.6 3.2(L)  Chloride 98 - 111 mmol/L 111 111 107  CO2 22 - 32 mmol/L _1 Calcium 8.9 - 10.3 mg/dL 7.6(L) 7.6(L) 7.6(L)  Total Protein 6.5 - 8.1 g/dL - 4.7(L) 4.7(L)  Total Bilirubin 0.3 - 1.2 mg/dL - 0.4 0.8  Alkaline Phos 38 - 126 U/L - 122 111  AST 15 - 41 U/L - 17 16  ALT 0 - 44 U/L - <5 <5    Lab Results  Component Value Date   WBC 2.6 (L) 07/15/2020   WBC 2.5 (L) 07/15/2020   HGB 8.9 (L) 07/15/2020   HGB 8.9 (L) 07/15/2020   HCT 27.1 (L) 07/15/2020   HCT 27.1 (L) 07/15/2020   MCV 88.6 07/15/2020   MCV 88.9 07/15/2020  PLT <5 (LL) 07/15/2020   PLT <5 (LL) 07/15/2020   NEUTROABS 1.2 (L) 07/15/2020    CT Head Wo Contrast  Result Date: 07/01/2020 CLINICAL DATA:  Recent seizure activity EXAM: CT HEAD WITHOUT CONTRAST TECHNIQUE: Contiguous axial images were obtained from the base of the skull through the vertex without intravenous contrast. COMPARISON:  04/07/2020 FINDINGS: Brain: Mild atrophic changes are noted. No findings to suggest acute hemorrhage, acute infarction or space-occupying  mass lesion noted. Vascular: No hyperdense vessel or unexpected calcification. Skull: Normal. Negative for fracture or focal lesion. Sinuses/Orbits: No acute finding. Chronic soft tissue changes are noted in the left ethmoid sinuses. Other: None. IMPRESSION: Mild atrophic changes without acute abnormality. Electronically Signed   By: Inez Catalina M.D.   On: 07/01/2020 15:05   NM GI Blood Loss  Result Date: 07/02/2020 CLINICAL DATA:  Acute lower GI bleeding, 3 episodes of large volume hematochezia. History of lung cancer EXAM: NUCLEAR MEDICINE GASTROINTESTINAL BLEEDING SCAN TECHNIQUE: Sequential abdominal images were obtained following intravenous administration of Tc-66mlabeled red blood cells. RADIOPHARMACEUTICALS:  22.7 mCi Tc-967mertechnetate in-vitro labeled red cells. COMPARISON:  05/21/2020 CT with contrast FINDINGS: Abnormal activity within the mid and lower abdomen extending into the upper pelvis and peristalsing throughout loops of bowel compatible with mild acute GI bleeding. Based on the pattern of peristalsis, it is difficult to determine location but small bowel is favored over colon. IMPRESSION: Positive exam for mild acute GI bleeding as above. These results will be called to the ordering clinician or representative by the Radiologist Assistant, and communication documented in the PACS or ClFrontier Oil CorporationElectronically Signed   By: M.Jerilynn Mages Shick M.D.   On: 07/02/2020 12:08   [place]  Result Date: 07/01/2020 CLINICAL DATA:  Acute kidney injury. EXAM: RENAL / URINARY TRACT ULTRASOUND COMPLETE COMPARISON:  CT abdomen pelvis dated May 21, 2020. FINDINGS: Right Kidney: Renal measurements: 10.0 x 4.2 x 5.0 cm = volume: 109 mL. Echogenicity within normal limits. No mass or hydronephrosis visualized. Left Kidney: Renal measurements: 9.1 x 5.1 x 5.3 cm = volume: 128 mL. Echogenicity within normal limits. No mass or hydronephrosis visualized. Other: Bilateral pleural effusions. IMPRESSION: 1. Normal  renal ultrasound. 2. Bilateral pleural effusions. Electronically Signed   By: WiTitus Dubin.D.   On: 07/01/2020 15:55   DG CHEST PORT 1 VIEW  Result Date: 07/07/2020 CLINICAL DATA:  History of lung carcinoma with weakness EXAM: PORTABLE CHEST 1 VIEW COMPARISON:  07/01/2020 FINDINGS: Cardiac shadow is stable. Right-sided chest wall port is again noted and stable. New bilateral pleural effusions are noted left greater than right with likely underlying atelectasis. No acute bony abnormality is seen. IMPRESSION: New bilateral pleural effusions left greater than right. Electronically Signed   By: MaInez Catalina.D.   On: 07/07/2020 20:13   DG Chest Port 1 View  Result Date: 07/01/2020 CLINICAL DATA:  Shortness of breath EXAM: PORTABLE CHEST 1 VIEW COMPARISON:  April 04, 2019 FINDINGS: Port-A-Cath tip is in the superior vena cava. No pneumothorax. There is a rather minimal right pleural effusion. No edema or airspace opacity. Heart size and pulmonary vascularity are normal. No adenopathy. No bone lesions. IMPRESSION: Port-A-Cath as noted. Other minimal right pleural effusion. Lungs otherwise clear. Cardiac silhouette normal. Electronically Signed   By: WiLowella GripII M.D.   On: 07/01/2020 11:44    ASSESSMENT AND PLAN: This is a [age]ear old African-American female with stage IV (T2a, N2, M1b) non-small cell lung cancer, adenocarcinoma presented with a large  right upper lobe lung mass in addition to suspicious right paratracheal lymphadenopathy and bilateral pulmonary nodules as well as retroperitoneal lymphadenopathy diagnosed in May 2021.  She has no actionable mutations and is negative for PD-L1.  The patient started palliative systemic chemotherapy with carboplatin for an AUC of 5, Alimta 500 mg/m, and Keytruda 200 mg IV every 3 weeks.  Starting from cycle #5, she has been on maintenance Alimta and Keytruda.  Her Alimta dose has been dose reduced secondary to neutropenia.  She has also  required intermittent holding of Alimta due to renal insufficiency.  She last received her chemotherapy on 06/18/2020.  The patient is now admitted GI bleed, pancytopenia, AKI, seizure-like activity, and probable sepsis with ?staph bacteremia.  Bleeding scan showed mild acute GI bleeding thought to be small bowel in origin. Clinically, she appears improved.  The patient received Granix with improvement of her white blood cell count.  Her hemoglobin has improved and she no longer has GI bleeding.  Her thrombocytopenia has been persistent.  It is possible this could be residual effects of the Alimta as she was not taking the prescribed folic acid to minimize toxicity with this medication.  Recommend close monitoring of platelet count transfuse for platelet count less than 20,000 or active bleeding.  Discussed thrombocytopenia with Dr. Julien Nordmann.  Will start her on dexamethasone 20 mg IV daily.  We will plan to see the patient back in the office after discharge to discuss further treatment.  We will consider discontinuing Alimta and continuing maintenance immunotherapy with Keytruda only.   LOS: 14 days   Mikey Bussing, DNP, AGPCNP-BC, AOCNP 07/15/20

## 2020-07-15 NOTE — Progress Notes (Signed)
Physical Therapy Treatment Patient Details Name: Brenda [name] MRN: [id number] DOB: 04/25/1960 Today's Date: 07/15/2020    History of Present Illness 60 years old female adm with Bacteremia associated with intravascular line, ARF, acute GI bleed, pancytopenia.   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 Julien Nordmann presented to the hospital with epistaxis, dark stools and generalized weakness for few days.  CXR showed Port-A-Cath in place and minimal right pleural effusion but otherwise clear.    PT Comments    Pt Ox3 however demonstrates decr insight into her current deficits; pt has difficulty staying on task and requires frequent redirection. she is easily aggravated by PT, agrees to bed level exercises; incr mobility deferred d/t pt not agreeable and medical issues/decr platelets.   Follow Up Recommendations  SNF;Other (comment) (refusing SNF)     Equipment Recommendations  Rolling walker with 5" wheels    Recommendations for Other Services       Precautions / Restrictions Precautions Precautions: Fall Restrictions Weight Bearing Restrictions: No    Mobility  Bed Mobility               General bed mobility comments: initiated then pt deferred    Transfers                    Ambulation/Gait                 Stairs             Wheelchair Mobility    Modified Rankin (Stroke Patients Only)       Balance                                            Cognition Arousal/Alertness: Awake/alert Behavior During Therapy: WFL for tasks assessed/performed Overall Cognitive Status: Impaired/Different from baseline Area of Impairment: Safety/judgement;Problem solving;Attention                   Current Attention Level: Sustained     Safety/Judgement: Decreased awareness of deficits   Problem Solving: Requires verbal  cues;Requires tactile cues;Decreased initiation General Comments: pt states she is trying to rest, requests BSC then declines, requests bedpan, then declines; pt states she has been up "moving around" ; pt continues to state she just needs to get "out of here" and she will be fine at home      Exercises General Exercises - Lower Extremity Ankle Circles/Pumps: AROM;Both;10 reps Heel Slides: AAROM;Both;10 reps Hip ABduction/ADduction: AAROM;Both;10 reps (requires encouragement)    General Comments        Pertinent Vitals/Pain Pain Assessment: No/denies pain    Home Living                      Prior Function            PT Goals (current goals can now be found in the care plan section) Acute Rehab PT Goals Patient Stated Goal: get stronger PT Goal Formulation: With patient Time For Goal Achievement: 07/20/20 Potential to Achieve Goals: Fair Progress towards PT goals: Progressing toward goals    Frequency    Min 3X/week      PT Plan Current plan remains appropriate    Co-evaluation              AM-PAC PT "6  Clicks" Mobility   Outcome Measure  Help needed turning from your back to your side while in a flat bed without using bedrails?: A Little Help needed moving from lying on your back to sitting on the side of a flat bed without using bedrails?: A Little Help needed moving to and from a bed to a chair (including a wheelchair)?: A Lot Help needed standing up from a chair using your arms (e.g., wheelchair or bedside chair)?: A Lot Help needed to walk in hospital room?: A Lot Help needed climbing 3-5 steps with a railing? : A Lot 6 Click Score: 14    End of Session   Activity Tolerance: Treatment limited secondary to medical complications (Comment);Patient limited by fatigue (decr platelets) Patient left: in bed;with call bell/phone within reach;with bed alarm set   PT Visit Diagnosis: Muscle weakness (generalized) (M62.81);Difficulty in walking, not  elsewhere classified (R26.2)     Time: 9470-7615 PT Time Calculation (min) (ACUTE ONLY): 16 min  Charges:  $Therapeutic Exercise: 8-22 mins                     Baxter Flattery, PT  Acute Rehab Dept ([facility]) [phone] Pager [phone]  07/15/2020    [address] [facility] 07/15/2020, 4:28 PM

## 2020-07-16 DIAGNOSIS — D61818 Other pancytopenia: Secondary | ICD-10-CM | POA: Diagnosis not present

## 2020-07-16 DIAGNOSIS — C3491 Malignant neoplasm of unspecified part of right bronchus or lung: Secondary | ICD-10-CM | POA: Diagnosis not present

## 2020-07-16 LAB — CBC WITH DIFFERENTIAL/PLATELET
Abs Immature Granulocytes: 0.16 10*3/uL — ABNORMAL HIGH (ref 0.00–0.07)
Basophils Absolute: 0 10*3/uL (ref 0.0–0.1)
Basophils Relative: 1 %
Eosinophils Absolute: 0 10*3/uL (ref 0.0–0.5)
Eosinophils Relative: 0 %
HCT: 29.1 % — ABNORMAL LOW (ref 36.0–46.0)
Hemoglobin: 9.4 g/dL — ABNORMAL LOW (ref 12.0–15.0)
Immature Granulocytes: 4 %
Lymphocytes Relative: 19 %
Lymphs Abs: 0.7 10*3/uL (ref 0.7–4.0)
MCH: 29.3 pg (ref 26.0–34.0)
MCHC: 32.3 g/dL (ref 30.0–36.0)
MCV: 90.7 fL (ref 80.0–100.0)
Monocytes Absolute: 0.4 10*3/uL (ref 0.1–1.0)
Monocytes Relative: 11 %
Neutro Abs: 2.6 10*3/uL (ref 1.7–7.7)
Neutrophils Relative %: 65 %
Platelets: <5 10*3/uL — CL (ref 150–400)
RBC: 3.21 MIL/uL — ABNORMAL LOW (ref 3.87–5.11)
RDW: 15.9 % — ABNORMAL HIGH (ref 11.5–15.5)
WBC: 4 10*3/uL (ref 4.0–10.5)
nRBC: 0 % (ref 0.0–0.2)

## 2020-07-16 LAB — PREPARE PLATELET PHERESIS
Unit division: 0
Unit division: 0

## 2020-07-16 LAB — BASIC METABOLIC PANEL
Anion gap: 7 (ref 5–15)
BUN: 12 mg/dL (ref 6–20)
CO2: 24 mmol/L (ref 22–32)
Calcium: 7.8 mg/dL — ABNORMAL LOW (ref 8.9–10.3)
Chloride: 111 mmol/L (ref 98–111)
Creatinine, Ser: 2.09 mg/dL — ABNORMAL HIGH (ref 0.44–1.00)
GFR, Estimated: 27 mL/min — ABNORMAL LOW (ref >60)
Glucose, Bld: 183 mg/dL — ABNORMAL HIGH (ref 70–99)
Potassium: 4.1 mmol/L (ref 3.5–5.1)
Sodium: 142 mmol/L (ref 135–145)

## 2020-07-16 LAB — BPAM PLATELET PHERESIS
Blood Product Expiration Date: 202204262359
Blood Product Expiration Date: 202204282359
ISSUE DATE / TIME: 202204251017
ISSUE DATE / TIME: 202204251311
Unit Type and Rh: 6200
Unit Type and Rh: 7300

## 2020-07-16 LAB — GLUCOSE, CAPILLARY
Glucose-Capillary: 159 mg/dL — ABNORMAL HIGH (ref 70–99)
Glucose-Capillary: 163 mg/dL — ABNORMAL HIGH (ref 70–99)
Glucose-Capillary: 139 mg/dL — ABNORMAL HIGH (ref 70–99)

## 2020-07-16 LAB — MAGNESIUM: Magnesium: 2 mg/dL (ref 1.7–2.4)

## 2020-07-16 MED ORDER — SODIUM CHLORIDE 0.9% IV SOLUTION: Freq: Once | INTRAVENOUS | Status: AC

## 2020-07-16 NOTE — Progress Notes (Signed)
OT Cancellation Note  Patient Details Name: Brenda [name] MRN: [id number] DOB: 1960/03/27   Cancelled Treatment:    Reason Eval/Treat Not Completed: Patient declined, no reason specified. First attempt patient eating breakfast, second attempt patient asks "can we do it in the morning, I got some medication for my head." Will re-attempt 4/27 as schedule allows.  Delbert Phenix OT OT pager: [phone] 07/16/2020, 12:22 PM

## 2020-07-16 NOTE — Progress Notes (Signed)
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  At time of presentation her lactic acid was greater than 11 and her creatinine was over 5 with hyperkalemia and metabolic acidosis.  She was transfused PRBCs in the ED and while there suffered a episode of focal seizure-like activity.  Significant Events: 4/11 admit to ICU - seizure activity - PRBC x3 - Plt x1 4/12 BRBPR - GI consulted - Heme/Onc consulted - nuc med bleed scan 4/12 blood cx + for coag neg Staph - ID consulted  4/12 PRBC x 2 - Plt x 3 4/13 TRH assumed care - PRBC x 3 - Plt x 2 4/14 Plt x1 4/15 Plt x 1 4/17 PRBC x1 - Plt x1 4/18 Plt x1 4/19 Plt x1 4/20 PRBC 1 - Plts x2 4/21 Plt x1 4/23 Plt x1 4/24 Plt x 1 4/25 Plt x2 4/26 Plt x2  Consultants:  GI Oncology ID [id number]  Code Status: FULL CODE  Antimicrobials:  Vancomycin 4/13 > 4/14 Cefazolin 4/16 >  DVT prophylaxis: SCDs  Subjective: Vitals remain stable.  Saturation 99%.  While WBC and hemoglobin have remained quite stable or even improved, platelet count remains severely depressed, with transfusions seemingly having no impact at this point.  Continues to report no evidence of active bleeding.  Denies chest pain shortness of breath fevers or chills.  Is in good spirits.  Assessment & Plan:  Pancytopenia Felt to be a consequence of chemotherapy toxicity - status post transfusion multiple units PRBC and platelets - Granix dosing has been completed per Oncology - transfuse for hemoglobin less than 8 or platelet count less than 20,000 or active bleeding - see individual cell lines discussed below   Thrombocytopenia As noted above - etiology of this cell line decline not as clear as apparently thrombocytopenia not a  usual response to her chemotherapy regimen, but patient was noncompliant with folic acid prophylaxis and this is thought to be contributing - despite ongoing transfusion support over the last 2-3 days, her numbers are not improving - cont to transfuse - Hematology has added high-dose steroid therapy for ?ITP - no active bleeding apparent at this time with  Recent Labs  Lab 07/11/20 1102 07/13/20 0401 07/14/20 0353 07/15/20 0330 07/16/20 0444  PLT 18* <5* <5* <5*  <5* <5*    Neutropenia ANC signif improved following Granix dosing per Heme/Onc  Acute GI bleed - resolved  Tagged RBC scan noted mild acute bleeding in the small bowel not felt to be significant enough to indicate embolization -suspected to be a minimal mucosal bleed -continue Protonix -hemoglobin holding steady  Recent Labs  Lab 07/11/20 1102 07/13/20 0401 07/14/20 0353 07/15/20 0330 07/16/20 0444  HGB 9.8* 9.3* 9.1* 8.9*  8.9* 9.4*    Acute kidney failure Due to chemotherapy and severe anemia/volume depletion -renal ultrasound without acute findings - baseline creatinine 1-1.4 -creatinine vacilating somewhat but overall stable around 2.0  Recent Labs  Lab 07/12/20 0430 07/13/20 0401 07/14/20 0353 07/15/20 0330 07/16/20 0444  CREATININE 1.87* 1.84* 1.93* 2.13* 2.09*    Stage IV non-small cell lung cancer (adenocarcinoma) - diag May 20221 Status post 2 cycles of pemetrexed and pembrolizumabm - Oncology directing care - last dose of chemo 06/18/20  RUE phlebitis Resolved  Isolated single seizure episode Possibly related to hypoxia - CT head without acute findings  Coag negative staph bacteremia Noted on only 1 set of blood cultures - ID evaluated - continued cefazolin through 4/25  HTN Blood pressure well controlled and at times trending downward -stopped blood pressure meds - following  Hypokalemia Improved with supplementation -follow intermittently  Hypomagnesemia Improved with supplementation  -follow intermittently  Family Communication:  Status is: Inpatient  Remains inpatient appropriate because:Inpatient level of care appropriate due to severity of illness   Dispo: The patient is from: Home              Anticipated d/c is to: SNF              Patient currently is not medically stable to d/c.   Difficult to place patient No     Objective: Blood pressure 100/71, pulse 97, temperature (!) 97.5 F (36.4 C), temperature source Oral, resp. rate 16, height 5\' 6"  (1.676 m), weight 68.4 kg, last menstrual period 01/19/2011, SpO2 99 %.  Intake/Output Summary (Last 24 hours) at 07/16/2020 0812 Last data filed at 07/16/2020 0703 Gross per 24 hour  Intake 2949 ml  Output 850 ml  Net 2099 ml   Filed Weights   07/13/20 0700 07/14/20 0500 07/16/20 0500  Weight: 67.5 kg 69.8 kg 68.4 kg    Examination: General: No acute respiratory distress Lungs: CTA B -no wheezing or focal crackles Cardiovascular: RRR without murmur or rub Abdomen: NT/ND, soft, bs+, no mass -no organomegaly appreciable Extremities: No edema B LE  CBC: Recent Labs  Lab 07/10/20 0814 07/11/20 1102 07/14/20 0353 07/15/20 0330 07/16/20 0444  WBC 1.3*   < > 3.6* 2.6*  2.5* 4.0  NEUTROABS 0.5*  --   --  1.2* 2.6  HGB 7.7*   < > 9.1* 8.9*  8.9* 9.4*  HCT 23.4*   < > 27.3* 27.1*  27.1* 29.1*  MCV 88.3   < > 87.5 88.6  88.9 90.7  PLT <5*   < > <5* <5*  <5* <5*   < > = values in this interval not displayed.   Basic Metabolic Panel: Recent Labs  Lab 07/14/20 0353 07/15/20 0330 07/16/20 0444  NA 143 141 142  K 3.6 3.3* 4.1  CL 111 111 111  CO2 23 24 24   GLUCOSE 108* 111* 183*  BUN 13 14 12   CREATININE 1.93* 2.13* 2.09*  CALCIUM 7.6* 7.6* 7.8*  MG 1.8 1.6* 2.0  PHOS  --  2.6  --    GFR: Estimated Creatinine Clearance: 27.8 mL/min (A) (by C-G formula based on SCr of 2.09 mg/dL (H)).  Liver Function Tests: Recent Labs  Lab 07/13/20 0401 07/14/20 0353 07/15/20 0330  AST 16 17  --    ALT <5 <5  --   ALKPHOS 111 122  --   BILITOT 0.8 0.4  --   PROT 4.7* 4.7*  --   ALBUMIN 1.3* 1.3* 1.5*     HbA1C: Hgb A1c MFr Bld  Date/Time Value Ref Range Status  07/01/2020 02:59 PM <4.2 (L) 4.8 - 5.6 % Final    Comment:    (NOTE) **Verified by repeat analysis**         Prediabetes: 5.7 - 6.4         Diabetes: >6.4         Glycemic control for adults with diabetes: <7.0     CBG: Recent Labs  Lab 07/14/20 1236 07/14/20 1704 07/15/20 0733 07/15/20 1120  07/15/20 1713  GLUCAP 104* 97 96 122* 129*     Scheduled Meds: . Chlorhexidine Gluconate Cloth  6 each Topical Daily  . dexamethasone (DECADRON) injection  20 mg Intravenous Q24H  . ferrous sulfate  325 mg Oral BID WC  . folic acid  1 mg Oral Daily  . insulin aspart  0-9 Units Subcutaneous TID WC  . mouth rinse  15 mL Mouth Rinse BID  . pantoprazole  40 mg Oral BID  . sodium chloride flush  10-40 mL Intracatheter Q12H   Continuous Infusions: . sodium chloride 75 mL/hr at 07/15/20 0915     LOS: 15 days   Cherene Altes, MD [facility] Office  [phone] Pager - Text Page per Shea Evans  If 7PM-7AM, please contact night-coverage per Amion 07/16/2020, 8:12 AM

## 2020-07-17 DIAGNOSIS — D649 Anemia, unspecified: Secondary | ICD-10-CM | POA: Diagnosis not present

## 2020-07-17 DIAGNOSIS — T827XXD Infection and inflammatory reaction due to other cardiac and vascular devices, implants and grafts, subsequent encounter: Secondary | ICD-10-CM | POA: Diagnosis not present

## 2020-07-17 DIAGNOSIS — C3491 Malignant neoplasm of unspecified part of right bronchus or lung: Secondary | ICD-10-CM | POA: Diagnosis not present

## 2020-07-17 DIAGNOSIS — N179 Acute kidney failure, unspecified: Secondary | ICD-10-CM | POA: Diagnosis not present

## 2020-07-17 LAB — CBC WITH DIFFERENTIAL/PLATELET
Abs Immature Granulocytes: 0.12 10*3/uL — ABNORMAL HIGH (ref 0.00–0.07)
Basophils Absolute: 0 10*3/uL (ref 0.0–0.1)
Basophils Relative: 0 %
Eosinophils Absolute: 0 10*3/uL (ref 0.0–0.5)
Eosinophils Relative: 0 %
HCT: 23.9 % — ABNORMAL LOW (ref 36.0–46.0)
Hemoglobin: 7.7 g/dL — ABNORMAL LOW (ref 12.0–15.0)
Immature Granulocytes: 2 %
Lymphocytes Relative: 20 %
Lymphs Abs: 1 10*3/uL (ref 0.7–4.0)
MCH: 28.8 pg (ref 26.0–34.0)
MCHC: 32.2 g/dL (ref 30.0–36.0)
MCV: 89.5 fL (ref 80.0–100.0)
Monocytes Absolute: 1 10*3/uL (ref 0.1–1.0)
Monocytes Relative: 20 %
Neutro Abs: 3 10*3/uL (ref 1.7–7.7)
Neutrophils Relative %: 58 %
Platelets: <5 10*3/uL — CL (ref 150–400)
RBC: 2.67 MIL/uL — ABNORMAL LOW (ref 3.87–5.11)
RDW: 15.7 % — ABNORMAL HIGH (ref 11.5–15.5)
WBC: 5.1 10*3/uL (ref 4.0–10.5)
nRBC: 0 % (ref 0.0–0.2)

## 2020-07-17 LAB — BPAM PLATELET PHERESIS
Blood Product Expiration Date: 202204262359
Blood Product Expiration Date: 202204272359
ISSUE DATE / TIME: 202204261005
ISSUE DATE / TIME: 202204261237
Unit Type and Rh: 6200
Unit Type and Rh: 9500

## 2020-07-17 LAB — PREPARE PLATELET PHERESIS
Unit division: 0
Unit division: 0

## 2020-07-17 LAB — GLUCOSE, CAPILLARY
Glucose-Capillary: 98 mg/dL (ref 70–99)
Glucose-Capillary: 99 mg/dL (ref 70–99)
Glucose-Capillary: 135 mg/dL — ABNORMAL HIGH (ref 70–99)

## 2020-07-17 LAB — PREPARE RBC (CROSSMATCH)

## 2020-07-17 MED ORDER — SODIUM CHLORIDE 0.9% IV SOLUTION: Freq: Once | INTRAVENOUS | Status: AC

## 2020-07-17 NOTE — Progress Notes (Signed)
PROGRESS NOTE    Brenda [name]  TIR:[id number] DOB: 1961-02-20 DOA: 07/01/2020 PCP: Pcp, No     Brief Narrative:  HPI from Dr Loma Sender is a 60 years old female with past medical history of stage IV non-small cell lung cancer- adenocarcinoma of right lung, undergoing chemotherapy with Dr. Julien Nordmann, who presented tothehospital with epistaxis,dark stools and generalized weakness for few days. In the ED, lactic acid was >11, with INR 1.4 andhad AKI withcreatinine over 5 without hyperkalemia and metabolic acidosis. Patient received PRBC in the ED and was more somnolent and had focal seizure-like activity. She was given Ativan and Keppra andhead CT was done. Bear hugger was initiated and the patient was admitted to the hospital. Hemoglobin was around 2.4 subsequently and was given packed RBC. Of note patient was initiated on chemotherapy on 09/28/2019 for 11 cycles and current regimen of with carboplatin for an AUC of 5, Alimta 500 mg/m2, and Keytruda 200 mg IV every 3 weeks. Alimtawasheld due to renal insufficiency and neutropenia. During hospitalization, patient was seen by GI and oncology. Patient has been having pancytopenia with initial bleeding and conservative management has been pursued at this time with blood and platelet transfusion daily for the past 2 weeks.  New events last 24 hours / Subjective: Patient any bleeding from any source, still noted to be anemic with thrombocytopenia.  Patient denies any chest pain, shortness of breath, abdominal pain, nausea/vomiting, fever/chills.  Reports generalized weakness, noted deconditioning.    Assessment & Plan:   Principal Problem:   Bacteremia associated with intravascular line ([place]) Active Problems:   Malignant neoplasm of right lung (HCC)   Hypokalemia   Acute on chronic anemia   Gastrointestinal hemorrhage   Pancytopenia (HCC)   Renal failure   AKI (acute kidney injury) ([place])   Acute blood loss  anemia   Pancytopenia Likely secondary to chemotherapy toxicity Status post multiple units of packed RBC and platelet transfusion on a daily basis for the past 2 weeks Pt received Granix as per oncology which has been completed- ANC improved Platelets with no significant improvement, despite daily transfusion, oncology started on high-dose steroid therapy for possible ITP.  No bleeding noted Continue totransfuse for hemoglobin less than 8 and platelet count less than 20,000 or active bleeding. Daily CBC  AKI Ongoing Likely secondary tochemotherapy and severe anemia with possible volume depletion. Received several liters of IV fluidduring hospitalization. Renal ultrasound was within normal limits. Baseline creatinine 1-1.4 Daily BMP  Acute GI bleed Patient had RBC scan showing mild acute GI bleeding in the small bowel. Unlikely to yield muchfrom angiogram/embolization as per GI.Marland Kitchen Likely mucosal bleeding and GIrecommended platelet and RBC transfusionas above.  Continue Protonix   RUE extremity phlebitis Improved  Stage IV lung cancer Adenocarcinomaby biopsy. Status post 2 cycles of pemetrexed and pembrolizumabm. Oncology on board  Seizure Isolated episode Unclear of etiology, possibility of hypoxia  Coagulase-negative staph bacteremia Only 1 set of blood culture was obtained. Both aerobic and anaerobic bottles were positive. Received vancomycin. Per ID continue cefazolin through 4/25, completed  Hypertension Continue Norvasc  GOC discussion Patient with overall poor prognosis, current stage IV lung adenocarcinoma with significant persistent pancytopenia Patient still wants to be full code Palliative care consulted    DVT prophylaxis:  SCDs Start: 07/01/20 1431  Code Status: Full code Family Communication: No family at bedside Disposition Plan:  Status is: Inpatient  Remains inpatient appropriate because:IV treatments appropriate due to intensity  of illness or inability to take PO  and Inpatient level of care appropriate due to severity of illness   Dispo: The patient is from: Home              Anticipated d/c is to: Home, patient declining SNF placement              Patient currently is not medically stable to d/c.               Difficult to place patient No   Antimicrobials:  Anti-infectives (From admission, onward)   Start     Dose/Rate Route Frequency Ordered Stop   07/08/20 1000  ceFAZolin (ANCEF) IVPB 2g/100 mL premix        2 g 200 mL/hr over 30 Minutes Intravenous Every 8 hours 07/08/20 0744 07/14/20 2359   07/06/20 2200  ceFAZolin (ANCEF) IVPB 1 g/50 mL premix  Status:  Discontinued        1 g 100 mL/hr over 30 Minutes Intravenous Every 12 hours 07/06/20 1058 07/08/20 0744   07/05/20 0600  vancomycin (VANCOREADY) IVPB 750 mg/150 mL        750 mg 150 mL/hr over 60 Minutes Intravenous  Once 07/04/20 1351 07/05/20 0712   07/03/20 1030  vancomycin (VANCOREADY) IVPB 750 mg/150 mL        750 mg 150 mL/hr over 60 Minutes Intravenous  Once 07/03/20 0942 07/03/20 1155   07/01/20 1145  ceFEPIme (MAXIPIME) 2 g in sodium chloride 0.9 % 100 mL IVPB        2 g 200 mL/hr over 30 Minutes Intravenous  Once 07/01/20 1140 07/01/20 1243   07/01/20 1145  vancomycin (VANCOCIN) IVPB 1000 mg/200 mL premix        1,000 mg 200 mL/hr over 60 Minutes Intravenous  Once 07/01/20 1140 07/01/20 1359       Objective: Vitals:   07/17/20 0415 07/17/20 1021 07/17/20 1036 07/17/20 1335  BP: 137/78 125/84 137/83 (!) 139/93  Pulse: 95 (!) 106 (!) 103 98  Resp: 18 18 16 18   Temp: 97.9 F (36.6 C) (!) 97.2 F (36.2 C) 97.8 F (36.6 C) 97.7 F (36.5 C)  TempSrc: Oral Oral Oral Oral  SpO2: 100% 96% 92% 96%  Weight: 69.8 kg     Height:        Intake/Output Summary (Last 24 hours) at 07/17/2020 1729 Last data filed at 07/17/2020 1400 Gross per 24 hour  Intake 1382.47 ml  Output 700 ml  Net 682.47 ml   Filed Weights   07/14/20 0500  07/16/20 0500 07/17/20 0415  Weight: 69.8 kg 68.4 kg 69.8 kg    Examination:  General: NAD, deconditoned  Cardiovascular: S1, S2 present  Respiratory: CTAB  Abdomen: Soft, nontender, nondistended, bowel sounds present  Musculoskeletal: No bilateral pedal edema noted  Skin: Normal  Psychiatry: Normal mood    Data Reviewed: I have personally reviewed following labs and imaging studies  CBC: Recent Labs  Lab 07/13/20 0401 07/14/20 0353 07/15/20 0330 07/16/20 0444 07/17/20 0403  WBC 3.2* 3.6* 2.6*  2.5* 4.0 5.1  NEUTROABS  --   --  1.2* 2.6 3.0  HGB 9.3* 9.1* 8.9*  8.9* 9.4* 7.7*  HCT 28.8* 27.3* 27.1*  27.1* 29.1* 23.9*  MCV 88.6 87.5 88.6  88.9 90.7 89.5  PLT <5* <5* <5*  <5* <5* <5*   Basic Metabolic Panel: Recent Labs  Lab 07/12/20 0430 07/13/20 0401 07/14/20 0353 07/15/20 0330 07/16/20 0444  NA 146* 140 143 141 142  K 3.5 3.2* 3.6 3.3*  4.1  CL 113* 107 111 111 111  CO2 25 23 23 24 24   GLUCOSE 96 103* 108* 111* 183*  BUN 13 13 13 14 12   CREATININE 1.87* 1.84* 1.93* 2.13* 2.09*  CALCIUM 7.7* 7.6* 7.6* 7.6* 7.8*  MG 1.8 1.6* 1.8 1.6* 2.0  PHOS  --   --   --  2.6  --    GFR: Estimated Creatinine Clearance: 27.8 mL/min (A) (by C-G formula based on SCr of 2.09 mg/dL (H)). Liver Function Tests: Recent Labs  Lab 07/13/20 0401 07/14/20 0353 07/15/20 0330  AST 16 17  --   ALT <5 <5  --   ALKPHOS 111 122  --   BILITOT 0.8 0.4  --   PROT 4.7* 4.7*  --   ALBUMIN 1.3* 1.3* 1.5*   No results for input(s): LIPASE, AMYLASE in the last 168 hours. No results for input(s): AMMONIA in the last 168 hours. Coagulation Profile: No results for input(s): INR, PROTIME in the last 168 hours. Cardiac Enzymes: No results for input(s): CKTOTAL, CKMB, CKMBINDEX, TROPONINI in the last 168 hours. BNP (last 3 results) No results for input(s): PROBNP in the last 8760 hours. HbA1C: No results for input(s): HGBA1C in the last 72 hours. CBG: Recent Labs  Lab  07/16/20 0823 07/16/20 1200 07/16/20 1734 07/17/20 0750 07/17/20 1121  GLUCAP 159* 163* 139* 98 99   Lipid Profile: No results for input(s): CHOL, HDL, LDLCALC, TRIG, CHOLHDL, LDLDIRECT in the last 72 hours. Thyroid Function Tests: No results for input(s): TSH, T4TOTAL, FREET4, T3FREE, THYROIDAB in the last 72 hours. Anemia Panel: No results for input(s): VITAMINB12, FOLATE, FERRITIN, TIBC, IRON, RETICCTPCT in the last 72 hours. Sepsis Labs: No results for input(s): PROCALCITON, LATICACIDVEN in the last 168 hours.  No results found for this or any previous visit (from the past 240 hour(s)).    Radiology Studies: No results found.    Scheduled Meds: . sodium chloride   Intravenous Once  . Chlorhexidine Gluconate Cloth  6 each Topical Daily  . dexamethasone (DECADRON) injection  20 mg Intravenous Q24H  . ferrous sulfate  325 mg Oral BID WC  . folic acid  1 mg Oral Daily  . insulin aspart  0-9 Units Subcutaneous TID WC  . mouth rinse  15 mL Mouth Rinse BID  . pantoprazole  40 mg Oral BID  . sodium chloride flush  10-40 mL Intracatheter Q12H   Continuous Infusions: . sodium chloride 50 mL/hr at 07/17/20 0420     LOS: 16 days     Alma Friendly, MD [facility] 07/17/2020, 5:29 PM   Available via Epic secure chat 7am-7pm After these hours, please refer to coverage provider listed on [email or web address]

## 2020-07-17 NOTE — TOC Progression Note (Signed)
Transition of Care [facility]) - Progression Note   Patient Details  Name: Brenda [name] MRN: [id number] Date of Birth: 1960-06-05  Transition of [facility]) CM/SW [place], LCSW Phone Number: 07/17/2020, 2:13 PM  Clinical Narrative: Readmission checklist completed due to high readmission score. CSW met with patient to discuss plans and PT recommendations. Per patient, she continues to not want SNF. Patient would prefer to discharge back to her living situation ([facility] [place]). CSW asked about DME. Patient reported she has a 3N1 at home and needs a rollator walker. CSW made DME referral to [facility] with [employer]. Rollator will be delivered prior to discharge. TOC to follow for discharge needs.  Expected Discharge Plan: [place] Barriers to Discharge: Continued Medical Work up  Expected Discharge Plan and Services Expected Discharge Plan: [place] In-house Referral: Clinical Social Work Discharge Planning Services: CM Consult Post Acute Care Choice: Durable Medical Equipment Living arrangements for the past 2 months: Single Family Home             DME Arranged: Walker rolling with seat DME Agency: [employer] Date DME Agency Contacted: 07/17/20 Time DME Agency Contacted: [id number] Representative spoke with at DME Agency: Freda Munro  Readmission Risk Interventions Readmission Risk Prevention Plan 07/17/2020 04/11/2020  Transportation Screening Complete Complete  PCP or Specialist Appt within 3-5 Days - Complete  HRI or [place] Complete Complete  Social Work Consult for [place] Planning/Counseling Complete Complete  Palliative Care Screening Not Applicable Not Applicable  Medication Review Press photographer) Complete Complete  Some recent data might be hidden

## 2020-07-17 NOTE — Progress Notes (Addendum)
Physical Therapy Treatment Patient Details Name: Brenda [name] MRN: [id number] DOB: May 12, 1960 Today's Date: 07/17/2020    History of Present Illness 60 years old female adm with Bacteremia associated with intravascular line, ARF, acute GI bleed, pancytopenia.   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 Julien Nordmann presented to the hospital with epistaxis, dark stools and generalized weakness for few days.  CXR showed Port-A-Cath in place and minimal right pleural effusion but otherwise clear.    PT Comments    Pt declined mobility, she stated she's too tired to get up because she just ate. Despite max encouragement to mobilize and explanation of risks of bedrest, pt continued to refuse.  Pt stated she's not sure how she'll be able to manage mobility upon acute DC, especially the 16 stairs to get to her motel room. She stated she's not willing to go to a SNF because,  "they'd take my check". Pt agreeable to bed level exercises only. If she DCs to a 2nd floor motel room without an elevator (where she was staying prior to admission), she will likely need ambulance transport, and a WC.    Follow Up Recommendations  SNF;Other (comment) (refusing SNF)     Equipment Recommendations  Rolling walker with 5" wheels;Wheelchair (measurements PT);Wheelchair cushion (measurements PT)    Recommendations for Other Services       Precautions / Restrictions Precautions Precautions: Fall Restrictions Weight Bearing Restrictions: No    Mobility  Bed Mobility               General bed mobility comments: pt refused    Transfers                 General transfer comment: pt refused  Ambulation/Gait             General Gait Details: pt refused   Stairs             Wheelchair Mobility    Modified Rankin (Stroke Patients Only)       Balance                                             Cognition Arousal/Alertness: Awake/alert Behavior During Therapy: WFL for tasks assessed/performed Overall Cognitive Status: No family/caregiver present to determine baseline cognitive functioning Area of Impairment: Safety/judgement;Problem solving;Attention                   Current Attention Level: Sustained     Safety/Judgement: Decreased awareness of deficits   Problem Solving: Requires verbal cues;Requires tactile cues;Decreased initiation General Comments: pt states she is trying to rest, she's tired bc she just ate ; pt states she's concerned about being able to mobilize upon DC (especially flight of stairs to her motel room) but refused to attempt getting out of bed.      Exercises General Exercises - Lower Extremity Ankle Circles/Pumps: AROM;Both;10 reps Short Arc Quad: AROM;Both;10 reps;Supine Heel Slides: AAROM;Both;10 reps Hip ABduction/ADduction: AAROM;Both;10 reps Shoulder Exercises Pendulum Exercise: AROM;Both;5 reps;Supine    General Comments        Pertinent Vitals/Pain Pain Assessment: No/denies pain    Home Living                      Prior Function  PT Goals (current goals can now be found in the care plan section) Acute Rehab PT Goals Patient Stated Goal: get stronger PT Goal Formulation: With patient Time For Goal Achievement: 07/20/20 Potential to Achieve Goals: Fair Progress towards PT goals: Not progressing toward goals - comment (pt self limits)    Frequency    Min 3X/week      PT Plan Current plan remains appropriate    Co-evaluation              AM-PAC PT "6 Clicks" Mobility   Outcome Measure  Help needed turning from your back to your side while in a flat bed without using bedrails?: A Little Help needed moving from lying on your back to sitting on the side of a flat bed without using bedrails?: A Little Help needed moving to and from a bed to a chair (including a  wheelchair)?: A Lot Help needed standing up from a chair using your arms (e.g., wheelchair or bedside chair)?: A Lot Help needed to walk in hospital room?: A Lot Help needed climbing 3-5 steps with a railing? : A Lot 6 Click Score: 14    End of Session   Activity Tolerance: Patient limited by fatigue (decr platelets) Patient left: in bed;with call bell/phone within reach;with bed alarm set Nurse Communication: Mobility status PT Visit Diagnosis: Muscle weakness (generalized) (M62.81);Difficulty in walking, not elsewhere classified (R26.2)     Time: 1448-1500 PT Time Calculation (min) (ACUTE ONLY): 12 min  Charges:  $Therapeutic Exercise: 8-22 mins                     Blondell Reveal Kistler PT 07/17/2020  Acute Rehabilitation Services Pager [phone] Office [phone]

## 2020-07-18 DIAGNOSIS — D649 Anemia, unspecified: Secondary | ICD-10-CM | POA: Diagnosis not present

## 2020-07-18 DIAGNOSIS — N179 Acute kidney failure, unspecified: Secondary | ICD-10-CM | POA: Diagnosis not present

## 2020-07-18 DIAGNOSIS — T827XXD Infection and inflammatory reaction due to other cardiac and vascular devices, implants and grafts, subsequent encounter: Secondary | ICD-10-CM | POA: Diagnosis not present

## 2020-07-18 DIAGNOSIS — Z515 Encounter for palliative care: Secondary | ICD-10-CM | POA: Diagnosis not present

## 2020-07-18 DIAGNOSIS — R531 Weakness: Secondary | ICD-10-CM

## 2020-07-18 DIAGNOSIS — Z7189 Other specified counseling: Secondary | ICD-10-CM | POA: Diagnosis not present

## 2020-07-18 DIAGNOSIS — C3491 Malignant neoplasm of unspecified part of right bronchus or lung: Secondary | ICD-10-CM | POA: Diagnosis not present

## 2020-07-18 LAB — BPAM PLATELET PHERESIS
Blood Product Expiration Date: 202204282359
ISSUE DATE / TIME: 202204271014
Unit Type and Rh: 7300

## 2020-07-18 LAB — PREPARE RBC (CROSSMATCH)

## 2020-07-18 LAB — GLUCOSE, CAPILLARY
Glucose-Capillary: 106 mg/dL — ABNORMAL HIGH (ref 70–99)
Glucose-Capillary: 117 mg/dL — ABNORMAL HIGH (ref 70–99)

## 2020-07-18 LAB — CBC WITH DIFFERENTIAL/PLATELET
Abs Immature Granulocytes: 0.25 10*3/uL — ABNORMAL HIGH (ref 0.00–0.07)
Abs Immature Granulocytes: 0.14 10*3/uL — ABNORMAL HIGH (ref 0.00–0.07)
Basophils Absolute: 0 10*3/uL (ref 0.0–0.1)
Basophils Absolute: 0 10*3/uL (ref 0.0–0.1)
Basophils Relative: 0 %
Basophils Relative: 0 %
Eosinophils Absolute: 0 10*3/uL (ref 0.0–0.5)
Eosinophils Absolute: 0 10*3/uL (ref 0.0–0.5)
Eosinophils Relative: 0 %
Eosinophils Relative: 0 %
HCT: 25.5 % — ABNORMAL LOW (ref 36.0–46.0)
HCT: 20.7 % — ABNORMAL LOW (ref 36.0–46.0)
Hemoglobin: 8.4 g/dL — ABNORMAL LOW (ref 12.0–15.0)
Hemoglobin: 6.8 g/dL — CL (ref 12.0–15.0)
Immature Granulocytes: 5 %
Immature Granulocytes: 2 %
Lymphocytes Relative: 15 %
Lymphocytes Relative: 13 %
Lymphs Abs: 0.8 10*3/uL (ref 0.7–4.0)
Lymphs Abs: 0.8 10*3/uL (ref 0.7–4.0)
MCH: 28.9 pg (ref 26.0–34.0)
MCH: 28.8 pg (ref 26.0–34.0)
MCHC: 32.9 g/dL (ref 30.0–36.0)
MCHC: 32.9 g/dL (ref 30.0–36.0)
MCV: 87.6 fL (ref 80.0–100.0)
MCV: 87.7 fL (ref 80.0–100.0)
Monocytes Absolute: 1 10*3/uL (ref 0.1–1.0)
Monocytes Absolute: 1.4 10*3/uL — ABNORMAL HIGH (ref 0.1–1.0)
Monocytes Relative: 18 %
Monocytes Relative: 22 %
Neutro Abs: 3.5 10*3/uL (ref 1.7–7.7)
Neutro Abs: 4.2 10*3/uL (ref 1.7–7.7)
Neutrophils Relative %: 62 %
Neutrophils Relative %: 63 %
Platelets: 9 10*3/uL — CL (ref 150–400)
Platelets: 27 10*3/uL — CL (ref 150–400)
RBC: 2.91 MIL/uL — ABNORMAL LOW (ref 3.87–5.11)
RBC: 2.36 MIL/uL — ABNORMAL LOW (ref 3.87–5.11)
RDW: 15.9 % — ABNORMAL HIGH (ref 11.5–15.5)
RDW: 15.9 % — ABNORMAL HIGH (ref 11.5–15.5)
WBC: 5.6 10*3/uL (ref 4.0–10.5)
WBC: 6.6 10*3/uL (ref 4.0–10.5)
nRBC: 0 % (ref 0.0–0.2)
nRBC: 0 % (ref 0.0–0.2)

## 2020-07-18 LAB — BASIC METABOLIC PANEL
Anion gap: 6 (ref 5–15)
BUN: 36 mg/dL — ABNORMAL HIGH (ref 6–20)
CO2: 23 mmol/L (ref 22–32)
Calcium: 7.4 mg/dL — ABNORMAL LOW (ref 8.9–10.3)
Chloride: 111 mmol/L (ref 98–111)
Creatinine, Ser: 2.19 mg/dL — ABNORMAL HIGH (ref 0.44–1.00)
GFR, Estimated: 26 mL/min — ABNORMAL LOW (ref >60)
Glucose, Bld: 111 mg/dL — ABNORMAL HIGH (ref 70–99)
Potassium: 3.9 mmol/L (ref 3.5–5.1)
Sodium: 140 mmol/L (ref 135–145)

## 2020-07-18 LAB — PREPARE PLATELET PHERESIS: Unit division: 0

## 2020-07-18 MED ORDER — SODIUM CHLORIDE 0.9% IV SOLUTION: Freq: Once | INTRAVENOUS | Status: AC

## 2020-07-18 NOTE — Progress Notes (Signed)
Brief oncology note:  CBC results have been reviewed and discussed with Dr. Julien Nordmann.  Platelet count is 9000 today.  She received a unit of platelets yesterday.  She remains on dexamethasone 20 mg daily.  CBC    Component Value Date/Time   WBC 5.6 07/18/2020 0339   RBC 2.91 (L) 07/18/2020 0339   HGB 8.4 (L) 07/18/2020 0339   HGB 9.4 (L) 06/18/2020 1126   HCT 25.5 (L) 07/18/2020 0339   PLT 9 (LL) 07/18/2020 0339   PLT 248 06/18/2020 1126   MCV 87.6 07/18/2020 0339   MCH 28.9 07/18/2020 0339   MCHC 32.9 07/18/2020 0339   RDW 15.9 (H) 07/18/2020 0339   LYMPHSABS 0.8 07/18/2020 0339   MONOABS 1.0 07/18/2020 0339   EOSABS 0.0 07/18/2020 0339   BASOSABS 0.0 07/18/2020 0339    Plan: -Continue dexamethasone 20 mg IV daily.  If platelet count remains 10,000 or less tomorrow, will consider adding Nplate. -Continue to transfuse platelets for platelet count less than 20,000 or active bleeding. -Transfuse PRBCs for hemoglobin less than 8.  I will see the patient tomorrow and leave a full note.

## 2020-07-18 NOTE — Consult Note (Signed)
Consultation Note Date: 07/18/2020   Patient Name: Brenda [name]  DOB: April 12, 1960  MRN: [id number]  Age / Sex: 60 y.o., female  PCP: Pcp, No Referring Physician: Alma Friendly, MD  Reason for Consultation: Establishing goals of care  HPI/Patient Profile: 60 y.o. female admitted on 07/01/2020   Clinical Assessment and Goals of Care: 60 year old lady who sees Dr. Earlie Server for stage IV non-small cell lung cancer adenocarcinoma undergoing chemotherapy.  Patient presented with generalized weakness epistaxis and dark stools.  Admitted through the emergency department to hospital medicine service.  Hospital course also complicated by possible focal seizure type activity.  Patient given packed red blood cell transfusion and is also status post multiple platelet transfusions.  Has also received Granix.  A palliative medicine consultation has been deemed appropriate for CODE STATUS and goals of care discussions given patient's serious illness and current hospitalization.  Patient is awake alert resting in bed.  I introduced myself and palliative care as follows: Palliative medicine is specialized medical care for people living with serious illness. It focuses on providing relief from the symptoms and stress of a serious illness. The goal is to improve quality of life for both the patient and the family.  Goals of care: Broad aims of medical therapy in relation to the patient's values and preferences. Our aim is to provide medical care aimed at enabling patients to achieve the goals that matter most to them, given the circumstances of their particular medical situation and their constraints.   Goals wishes and values important to the patient attempted to be explored.  Attempted to gain better understanding of her social situation.  We discussed about advanced directives.  CODE STATUS discussions also undertaken.     She complains of profound weakness and her recent inability to walk.  We discussed about physical therapy's recommendation for skilled nursing facility rehab attempt.  She will consider.  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  She sometimes lives with her Brenda [name] and they have known each other for 13 years.  She has a niece who is also involved with her care.  Call placed, unable to reach fianc Brenda [name], unable to reach niece.  Son Brenda [name]'s phone number is not known.  Patient elects for full code full scope of care at this time. She will how ever further discuss with her son about difference between full code and DNR. Patient states her goal is to be able to walk as good as she used to before.   NEXT OF KIN Has a fianc.  Has a niece and son.  SUMMARY OF RECOMMENDATIONS   Full code/full scope care to continue for now Patient elects her son Brenda [name] to be her designated healthcare power of attorney agent.  She has not completed advanced directives. Recommend skilled nursing facility rehabilitation attempt with palliative services following towards the end of this hospitalization.  Code Status/Advance Care Planning:  Full code    Symptom  Management:      Palliative Prophylaxis:   Delirium Protocol  Additional Recommendations (Limitations, Scope, Preferences):  Full Scope Treatment  Psycho-social/Spiritual:   Desire for further Chaplaincy support:yes  Additional Recommendations: Caregiving  Support/Resources  Prognosis:   Unable to determine  Discharge Planning: [facility] for rehab with Palliative care service follow-up      Primary Diagnoses: Present on Admission: . Renal failure . Malignant neoplasm of right lung ([place]) . Gastrointestinal hemorrhage . Hypokalemia . AKI (acute kidney injury) ([place]) . Acute on chronic anemia . Pancytopenia ([place])   I have  reviewed the medical record, interviewed the patient and family, and examined the patient. The following aspects are pertinent.  Past Medical History:  Diagnosis Date  . Breast cancer ([place])   . Depression   . Mental disorder    Social History   Socioeconomic History  . Marital status: Legally Separated    Spouse name: Not on file  . Number of children: Not on file  . Years of education: Not on file  . Highest education level: Not on file  Occupational History  . Not on file  Tobacco Use  . Smoking status: Former Smoker    Packs/day: 0.50    Years: 30.00    Pack years: 15.00    Types: Cigarettes    Quit date: 05/27/2019    Years since quitting: 1.1  . Smokeless tobacco: Never Used  Vaping Use  . Vaping Use: Never used  Substance and Sexual Activity  . Alcohol use: Yes    Alcohol/week: 84.0 standard drinks    Types: 84 Cans of beer per week  . Drug use: Yes    Types: Cocaine  . Sexual activity: Not on file  Other Topics Concern  . Not on file  Social History Narrative   ** Merged History Encounter **       Social Determinants of Health   Financial Resource Strain: Not on file  Food Insecurity: Not on file  Transportation Needs: Not on file  Physical Activity: Not on file  Stress: Not on file  Social Connections: Not on file   History reviewed. No pertinent family history. Scheduled Meds: . sodium chloride   Intravenous Once  . Chlorhexidine Gluconate Cloth  6 each Topical Daily  . dexamethasone (DECADRON) injection  20 mg Intravenous Q24H  . ferrous sulfate  325 mg Oral BID WC  . folic acid  1 mg Oral Daily  . insulin aspart  0-9 Units Subcutaneous TID WC  . mouth rinse  15 mL Mouth Rinse BID  . pantoprazole  40 mg Oral BID  . sodium chloride flush  10-40 mL Intracatheter Q12H   Continuous Infusions: . sodium chloride 50 mL/hr at 07/18/20 0425   PRN Meds:.acetaminophen, alum & mag hydroxide-simeth, diphenhydrAMINE, docusate sodium, Gerhardt's butt cream,  hydrALAZINE, hydrocortisone, ipratropium-albuterol, loperamide, ondansetron (ZOFRAN) IV, sodium chloride flush, traMADol Medications Prior to Admission:  Prior to Admission medications   Medication Sig Start Date End Date Taking? Authorizing Provider  acetaminophen (TYLENOL) 325 MG tablet Take 2 tablets (650 mg total) by mouth every 6 (six) hours as needed for mild pain, moderate pain or headache. 04/11/20  Yes Domenic Polite, MD  ferrous sulfate 325 (65 FE) MG tablet TAKE 1 TABLET (325 MG TOTAL) BY MOUTH 2 (TWO) TIMES DAILY WITH A MEAL. Patient taking differently: Take 325 mg by mouth 2 (two) times daily with a meal. 04/11/20 04/11/21 Yes Domenic Polite, MD  folic acid (FOLVITE) 1 MG tablet  TAKE 1 TABLET (1 MG TOTAL) BY MOUTH DAILY. Patient taking differently: Take 1 mg by mouth daily. 04/11/20 04/11/21 Yes Domenic Polite, MD  gabapentin (NEURONTIN) 300 MG capsule Take 1 capsule (300 mg total) by mouth at bedtime. 06/11/20  Yes Tanner, Lyndon Code., PA-C  lidocaine-prilocaine (EMLA) cream Apply 1 application topically as needed. Patient taking differently: Apply 1 application topically as needed (port access). 10/19/19  Yes Heilingoetter, Cassandra L, PA-C  pantoprazole (PROTONIX) 40 MG tablet TAKE 1 TABLET (40 MG TOTAL) BY MOUTH 2 (TWO) TIMES DAILY. Patient taking differently: Take 40 mg by mouth 2 (two) times daily. 04/11/20 04/11/21 Yes Domenic Polite, MD  prochlorperazine (COMPAZINE) 10 MG tablet Take 1 tablet (10 mg total) by mouth every 6 (six) hours as needed for nausea or vomiting. Patient not taking: Reported on 04/08/2020 01/17/20 04/11/20  Heilingoetter, Cassandra L, PA-C   Allergies  Allergen Reactions  . Aspirin Adult Low [Aspirin] Other (See Comments)    Stomach upset   Review of Systems Patient complains of not being able to walk anymore, complains of profound weakness. Physical Exam Resting in bed Appears with generalized weakness Skin is warm and dry, clubbing of digits noted Appears  chronically ill Answers all questions appropriately, does not appear to be in acute distress Regular work of breathing S1-S2 Does not have edema  Vital Signs: BP 140/86   Pulse 99   Temp 97.7 F (36.5 C)   Resp 16   Ht 5\' 6"  (1.676 m)   Wt 72.6 kg   LMP 01/19/2011   SpO2 100%   BMI 25.83 kg/m  Pain Scale: 0-10 POSS *See Group Information*: S-Acceptable,Sleep, easy to arouse Pain Score: 0-No pain   SpO2: SpO2: 100 % O2 Device:SpO2: 100 % O2 Flow Rate: .O2 Flow Rate (L/min): 2 L/min  IO: Intake/output summary:   Intake/Output Summary (Last 24 hours) at 07/18/2020 1307 Last data filed at 07/18/2020 1220 Gross per 24 hour  Intake 2682.45 ml  Output 550 ml  Net 2132.45 ml    LBM: Last BM Date: 07/14/20 Baseline Weight: Weight: 52.6 kg Most recent weight: Weight: 72.6 kg     Palliative Assessment/Data:   PPS 40%  Time In:  12 Time Out:  1300 Time Total: 60 min.   Greater than 50%  of this time was spent counseling and coordinating care related to the above assessment and plan.  Signed by: Loistine Chance, MD   Please contact Palliative Medicine Team phone at [phone] for questions and concerns.  For individual provider: See Shea Evans

## 2020-07-18 NOTE — Progress Notes (Signed)
PROGRESS NOTE    Brenda [name]  CVE:[id number] DOB: [birth year] DOA: 07/01/2020 PCP: Pcp, No     Brief Narrative:  HPI from Dr Loma Sender is a 60 years old female with past medical history of stage IV non-small cell lung cancer- adenocarcinoma of right lung, undergoing chemotherapy with Dr. Julien Nordmann, who presented tothehospital with epistaxis,dark stools and generalized weakness for few days. In the ED, lactic acid was >11, with INR 1.4 andhad AKI withcreatinine over 5 without hyperkalemia and metabolic acidosis. Patient received PRBC in the ED and was more somnolent and had focal seizure-like activity. She was given Ativan and Keppra andhead CT was done. Bear hugger was initiated and the patient was admitted to the hospital. Hemoglobin was around 2.4 subsequently and was given packed RBC. Of note patient was initiated on chemotherapy on 09/28/2019 for 11 cycles and current regimen of with carboplatin for an AUC of 5, Alimta 500 mg/m2, and Keytruda 200 mg IV every 3 weeks. Alimtawasheld due to renal insufficiency and neutropenia. During hospitalization, patient was seen by GI and oncology. Patient has been having pancytopenia with initial bleeding and conservative management has been pursued at this time with blood and platelet transfusion daily for the past 2 weeks.  New events last 24 hours / Subjective: Today, patient denies any new complaints, continues to be severely thrombocytopenic as well as anemic.  Noted to have a huge BM with blood this pm    Assessment & Plan:   Principal Problem:   Bacteremia associated with intravascular line ([place]) Active Problems:   Malignant neoplasm of right lung ([facility])   Hypokalemia   Acute on chronic anemia   Gastrointestinal hemorrhage   Pancytopenia ([facility])   Renal failure   AKI (acute kidney injury) ([place])   Acute blood loss anemia   Pancytopenia Likely secondary to chemotherapy toxicity Status post packed RBC and platelet  transfusion on a daily basis for the past 2 weeks Pt received Granix as per oncology which has been completed- ANC improved Platelets with no significant improvement, despite daily transfusion, oncology started on 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 Daily CBC  AKI Ongoing Likely secondary tochemotherapy and severe anemia with possible volume depletion. Received several liters of IV fluidduring hospitalization. Renal ultrasound was within normal limits. Baseline creatinine 1-1.4 Daily BMP  Acute GI bleed Patient had RBC scan showing mild acute GI bleeding in the small bowel. Unlikely to yield muchfrom angiogram/embolization as per GI. Had a BM with blood in it on 07/18/20, likely mucosal bleeding GIrecommended platelet and RBC transfusionas above.  Continue Protonix   RUE extremity phlebitis Improved  Stage IV lung cancer Adenocarcinomaby biopsy. Status post 2 cycles of pemetrexed and pembrolizumabm. Oncology on board  Seizure Isolated episode Unclear of etiology, possibility of hypoxia  Coagulase-negative staph bacteremia Only 1 set of blood culture was obtained. Both aerobic and anaerobic bottles were positive. Received vancomycin. Per ID continue cefazolin through 4/25, completed  Hypertension Continue Norvasc  GOC discussion Patient with overall poor prognosis, current stage IV lung adenocarcinoma with significant persistent pancytopenia Patient still wants to be full code Palliative care consulted, appreciate recs    DVT prophylaxis:  SCDs Start: 07/01/20 1431  Code Status: Full code Family Communication: No family at bedside Disposition Plan:  Status is: Inpatient  Remains inpatient appropriate because:IV treatments appropriate due to intensity of illness or inability to take PO and Inpatient level of  care appropriate due to severity  of illness   Dispo: The patient is from: Home              Anticipated d/c is to: Home, patient declining SNF placement              Patient currently is not medically stable to d/c.               Difficult to place patient No   Antimicrobials:  Anti-infectives (From admission, onward)   Start     Dose/Rate Route Frequency Ordered Stop   07/08/20 1000  ceFAZolin (ANCEF) IVPB 2g/100 mL premix        2 g 200 mL/hr over 30 Minutes Intravenous Every 8 hours 07/08/20 0744 07/14/20 2359   07/06/20 2200  ceFAZolin (ANCEF) IVPB 1 g/50 mL premix  Status:  Discontinued        1 g 100 mL/hr over 30 Minutes Intravenous Every 12 hours 07/06/20 1058 07/08/20 0744   07/05/20 0600  vancomycin (VANCOREADY) IVPB 750 mg/150 mL        750 mg 150 mL/hr over 60 Minutes Intravenous  Once 07/04/20 1351 07/05/20 0712   07/03/20 1030  vancomycin (VANCOREADY) IVPB 750 mg/150 mL        750 mg 150 mL/hr over 60 Minutes Intravenous  Once 07/03/20 0942 07/03/20 1155   07/01/20 1145  ceFEPIme (MAXIPIME) 2 g in sodium chloride 0.9 % 100 mL IVPB        2 g 200 mL/hr over 30 Minutes Intravenous  Once 07/01/20 1140 07/01/20 1243   07/01/20 1145  vancomycin (VANCOCIN) IVPB 1000 mg/200 mL premix        1,000 mg 200 mL/hr over 60 Minutes Intravenous  Once 07/01/20 1140 07/01/20 1359       Objective: Vitals:   07/18/20 1000 07/18/20 1215 07/18/20 1236 07/18/20 1430  BP: (!) 143/95 (!) 142/96 140/86 137/87  Pulse: 98 95 99 100  Resp: 16 16 16 16   Temp: 97.8 F (36.6 C) 97.6 F (36.4 C) 97.7 F (36.5 C) 97.6 F (36.4 C)  TempSrc:  Oral  Oral  SpO2:  100%  100%  Weight:      Height:        Intake/Output Summary (Last 24 hours) at 07/18/2020 1611 Last data filed at 07/18/2020 1430 Gross per 24 hour  Intake 2792.48 ml  Output 550 ml  Net 2242.48 ml   Filed Weights   07/16/20 0500 07/17/20 0415 07/18/20 0645  Weight: 68.4 kg 69.8 kg 72.6 kg    Examination:  General: NAD,  deconditoned  Cardiovascular: S1, S2 present  Respiratory: CTAB  Abdomen: Soft, nontender, nondistended, bowel sounds present  Musculoskeletal: No bilateral pedal edema noted  Skin: Normal  Psychiatry: Normal mood    Data Reviewed: I have personally reviewed following labs and imaging studies  CBC: Recent Labs  Lab 07/14/20 0353 07/15/20 0330 07/16/20 0444 07/17/20 0403 07/18/20 0339  WBC 3.6* 2.6*  2.5* 4.0 5.1 5.6  NEUTROABS  --  1.2* 2.6 3.0 3.5  HGB 9.1* 8.9*  8.9* 9.4* 7.7* 8.4*  HCT 27.3* 27.1*  27.1* 29.1* 23.9* 25.5*  MCV 87.5 88.6  88.9 90.7 89.5 87.6  PLT <5* <5*  <5* <5* <5* 9*   Basic Metabolic Panel: Recent Labs  Lab 07/12/20 0430 07/13/20 0401 07/14/20 0353 07/15/20 0330 07/16/20 0444 07/18/20 0339  NA 146* 140 143 141 142 140  K 3.5 3.2* 3.6 3.3* 4.1 3.9  CL 113*  107 111 111 111 111  CO2 25 23 23 24 24 23   GLUCOSE 96 103* 108* 111* 183* 111*  BUN 13 13 13 14 12  36*  CREATININE 1.87* 1.84* 1.93* 2.13* 2.09* 2.19*  CALCIUM 7.7* 7.6* 7.6* 7.6* 7.8* 7.4*  MG 1.8 1.6* 1.8 1.6* 2.0  --   PHOS  --   --   --  2.6  --   --    GFR: Estimated Creatinine Clearance: 28.9 mL/min (A) (by C-G formula based on SCr of 2.19 mg/dL (H)). Liver Function Tests: Recent Labs  Lab 07/13/20 0401 07/14/20 0353 07/15/20 0330  AST 16 17  --   ALT <5 <5  --   ALKPHOS 111 122  --   BILITOT 0.8 0.4  --   PROT 4.7* 4.7*  --   ALBUMIN 1.3* 1.3* 1.5*   No results for input(s): LIPASE, AMYLASE in the last 168 hours. No results for input(s): AMMONIA in the last 168 hours. Coagulation Profile: No results for input(s): INR, PROTIME in the last 168 hours. Cardiac Enzymes: No results for input(s): CKTOTAL, CKMB, CKMBINDEX, TROPONINI in the last 168 hours. BNP (last 3 results) No results for input(s): PROBNP in the last 8760 hours. HbA1C: No results for input(s): HGBA1C in the last 72 hours. CBG: Recent Labs  Lab 07/16/20 1734 07/17/20 0750 07/17/20 1121  07/17/20 1809 07/18/20 0832  GLUCAP 139* 98 99 135* 106*   Lipid Profile: No results for input(s): CHOL, HDL, LDLCALC, TRIG, CHOLHDL, LDLDIRECT in the last 72 hours. Thyroid Function Tests: No results for input(s): TSH, T4TOTAL, FREET4, T3FREE, THYROIDAB in the last 72 hours. Anemia Panel: No results for input(s): VITAMINB12, FOLATE, FERRITIN, TIBC, IRON, RETICCTPCT in the last 72 hours. Sepsis Labs: No results for input(s): PROCALCITON, LATICACIDVEN in the last 168 hours.  No results found for this or any previous visit (from the past 240 hour(s)).    Radiology Studies: No results found.    Scheduled Meds: . sodium chloride   Intravenous Once  . Chlorhexidine Gluconate Cloth  6 each Topical Daily  . dexamethasone (DECADRON) injection  20 mg Intravenous Q24H  . ferrous sulfate  325 mg Oral BID WC  . folic acid  1 mg Oral Daily  . insulin aspart  0-9 Units Subcutaneous TID WC  . mouth rinse  15 mL Mouth Rinse BID  . pantoprazole  40 mg Oral BID  . sodium chloride flush  10-40 mL Intracatheter Q12H   Continuous Infusions: . sodium chloride 50 mL/hr at 07/18/20 0425     LOS: 17 days     Alma Friendly, MD [facility] 07/18/2020, 4:11 PM   Available via Epic secure chat 7am-7pm After these hours, please refer to coverage provider listed on [email or web address]

## 2020-07-18 NOTE — Progress Notes (Signed)
CRITICAL VALUE STICKER  CRITICAL VALUE: Hemaglobin and platelets  RECEIVER (on-site recipient of call): Deanna Artis, rn  DATE & TIME NOTIFIED:  07/18/2020, 1730  MESSENGER (representative from lab):  MD NOTIFIED:  [place], notified through [place]: [phone]   RESPONSE:  Awaiting further orders

## 2020-07-18 NOTE — Plan of Care (Signed)
  Problem: Education: Goal: Knowledge of General Education information will improve Description: Including pain rating scale, medication(s)/side effects and non-pharmacologic comfort measures Outcome: Progressing   Problem: Activity: Goal: Risk for activity intolerance will decrease Outcome: Progressing   Problem: Pain Managment: Goal: General experience of comfort will improve Outcome: Progressing   

## 2020-07-18 NOTE — Progress Notes (Signed)
Occupational Therapy Treatment Patient Details Name: Brenda [name] MRN: [id number] DOB: [birth year] Today's Date: 07/18/2020    History of present illness 60 years old female adm with Bacteremia associated with intravascular line, ARF, acute GI bleed, pancytopenia.   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 Julien Nordmann presented to the hospital with epistaxis, dark stools and generalized weakness for few days.  CXR showed Port-A-Cath in place and minimal right pleural effusion but otherwise clear.   OT comments  Treatment predominantly focused on educating patient on therapeutic process and need for activity and getting out of bed. Patient's conversation tangential today and perseverates on the need to get a power of attorney. Patient verbalizes understanding of what therapist is saying - but has poor awareness of deficits and understanding of further decline. Patient has continues to refuse SNF to case management and reports wanting to go back to her hotel room. Patient has not made progress with therapy - with patient barely sitting at the edge of bed and her participation poor. Today partially able to sit at edge of bed with max assist - and then laying herself back down complaining of shortness of breath. O2 sat 100% and BP WFL. Therapist still recommending SNF placement as patient is not able to take care of herself - however, may be more custodial or palliative as patient predominantly not participatory and not progressing with therapy.   Follow Up Recommendations  SNF    Equipment Recommendations  3 in 1 bedside commode    Recommendations for Other Services      Precautions / Restrictions Precautions Precautions: Fall Precaution Comments: monitor VS,  decr BP, decr platelets Restrictions Weight Bearing Restrictions: No       Mobility Bed Mobility Overal bed mobility: Needs Assistance Bed  Mobility: Supine to Sit Rolling: Max assist         General bed mobility comments: Max assist to transfer partially to side of bed - patient needing assistance to move LEs and negotiate trunk. Patient using bed rails to assist but doesn't have the strength to pull herself up. Mid transfer patient lays herself back down complaining of shortness of breath. o2 sat 100%. BP WFL as well.    Transfers                      Balance Overall balance assessment: Needs assistance Sitting-balance support: No upper extremity supported Sitting balance-Leahy Scale: Poor                                     ADL either performed or assessed with clinical judgement   ADL                                               Vision Patient Visual Report: No change from baseline     Perception     Praxis      Cognition Arousal/Alertness: Awake/alert Behavior During Therapy: WFL for tasks assessed/performed Overall Cognitive Status: Difficult to assess Area of Impairment: Safety/judgement;Awareness                   Current Attention Level: Selective     Safety/Judgement: Decreased awareness of deficits Awareness: Emergent   General  Comments: Patient insight to deficits is poor. Perseverates on needing a power of attorney. Depsite extensive education in regards to needing to get out of bed and begin performing ADLs herself - she doesn't perform. Conversation tangential.        Exercises     Shoulder Instructions       General Comments      Pertinent Vitals/ Pain       Pain Assessment: No/denies pain  Home Living                                          Prior Functioning/Environment              Frequency  Min 2X/week        Progress Toward Goals  OT Goals(current goals can now be found in the care plan section)  Progress towards OT goals: Not progressing toward goals - comment (minimal  participation)  Acute Rehab OT Goals Patient Stated Goal: None states OT Goal Formulation: Patient unable to participate in goal setting Time For Goal Achievement: 07/24/20 Potential to Achieve Goals: Poor  Plan Discharge plan remains appropriate    Co-evaluation                 AM-PAC OT "6 Clicks" Daily Activity     Outcome Measure   Help from another person eating meals?: None Help from another person taking care of personal grooming?: A Little Help from another person toileting, which includes using toliet, bedpan, or urinal?: Total Help from another person bathing (including washing, rinsing, drying)?: A Lot Help from another person to put on and taking off regular upper body clothing?: A Little Help from another person to put on and taking off regular lower body clothing?: Total 6 Click Score: 14    End of Session Equipment Utilized During Treatment: Oxygen  OT Visit Diagnosis: Unsteadiness on feet (R26.81);Muscle weakness (generalized) (M62.81);Pain   Activity Tolerance Patient limited by fatigue   Patient Left in bed;with call bell/phone within reach   Nurse Communication Mobility status        Time: 3220-2542 OT Time Calculation (min): 19 min  Charges: OT General Charges $OT Visit: 1 Visit OT Treatments $Therapeutic Activity: 8-22 mins  Derl Barrow, OTR/L [place]  Office [phone] Pager: [place] 07/18/2020, 9:45 AM

## 2020-07-19 DIAGNOSIS — T827XXD Infection and inflammatory reaction due to other cardiac and vascular devices, implants and grafts, subsequent encounter: Secondary | ICD-10-CM | POA: Diagnosis not present

## 2020-07-19 DIAGNOSIS — D61818 Other pancytopenia: Secondary | ICD-10-CM | POA: Diagnosis not present

## 2020-07-19 DIAGNOSIS — N179 Acute kidney failure, unspecified: Secondary | ICD-10-CM | POA: Diagnosis not present

## 2020-07-19 DIAGNOSIS — C3491 Malignant neoplasm of unspecified part of right bronchus or lung: Secondary | ICD-10-CM | POA: Diagnosis not present

## 2020-07-19 DIAGNOSIS — D649 Anemia, unspecified: Secondary | ICD-10-CM | POA: Diagnosis not present

## 2020-07-19 LAB — GLUCOSE, CAPILLARY
Glucose-Capillary: 102 mg/dL — ABNORMAL HIGH (ref 70–99)
Glucose-Capillary: 75 mg/dL (ref 70–99)
Glucose-Capillary: 93 mg/dL (ref 70–99)
Glucose-Capillary: 158 mg/dL — ABNORMAL HIGH (ref 70–99)

## 2020-07-19 LAB — BPAM PLATELET PHERESIS
Blood Product Expiration Date: 202204282359
Blood Product Expiration Date: 202204292359
ISSUE DATE / TIME: 202204280830
ISSUE DATE / TIME: 202204281212
Unit Type and Rh: 5100
Unit Type and Rh: 5100

## 2020-07-19 LAB — BASIC METABOLIC PANEL
Anion gap: 8 (ref 5–15)
BUN: 54 mg/dL — ABNORMAL HIGH (ref 6–20)
CO2: 22 mmol/L (ref 22–32)
Calcium: 7.6 mg/dL — ABNORMAL LOW (ref 8.9–10.3)
Chloride: 112 mmol/L — ABNORMAL HIGH (ref 98–111)
Creatinine, Ser: 2.15 mg/dL — ABNORMAL HIGH (ref 0.44–1.00)
GFR, Estimated: 26 mL/min — ABNORMAL LOW (ref >60)
Glucose, Bld: 101 mg/dL — ABNORMAL HIGH (ref 70–99)
Potassium: 3.8 mmol/L (ref 3.5–5.1)
Sodium: 142 mmol/L (ref 135–145)

## 2020-07-19 LAB — CBC WITH DIFFERENTIAL/PLATELET
Abs Immature Granulocytes: 0.18 10*3/uL — ABNORMAL HIGH (ref 0.00–0.07)
Basophils Absolute: 0 10*3/uL (ref 0.0–0.1)
Basophils Relative: 0 %
Eosinophils Absolute: 0 10*3/uL (ref 0.0–0.5)
Eosinophils Relative: 0 %
HCT: 26.8 % — ABNORMAL LOW (ref 36.0–46.0)
Hemoglobin: 9.1 g/dL — ABNORMAL LOW (ref 12.0–15.0)
Immature Granulocytes: 3 %
Lymphocytes Relative: 14 %
Lymphs Abs: 1.1 10*3/uL (ref 0.7–4.0)
MCH: 28.7 pg (ref 26.0–34.0)
MCHC: 34 g/dL (ref 30.0–36.0)
MCV: 84.5 fL (ref 80.0–100.0)
Monocytes Absolute: 1.3 10*3/uL — ABNORMAL HIGH (ref 0.1–1.0)
Monocytes Relative: 18 %
Neutro Abs: 4.8 10*3/uL (ref 1.7–7.7)
Neutrophils Relative %: 65 %
Platelets: 8 10*3/uL — CL (ref 150–400)
RBC: 3.17 MIL/uL — ABNORMAL LOW (ref 3.87–5.11)
RDW: 15.6 % — ABNORMAL HIGH (ref 11.5–15.5)
WBC: 7.3 10*3/uL (ref 4.0–10.5)
nRBC: 0 % (ref 0.0–0.2)

## 2020-07-19 LAB — PREPARE PLATELET PHERESIS
Unit division: 0
Unit division: 0

## 2020-07-19 LAB — HEMOGLOBIN AND HEMATOCRIT, BLOOD
HCT: 24.3 % — ABNORMAL LOW (ref 36.0–46.0)
Hemoglobin: 8.4 g/dL — ABNORMAL LOW (ref 12.0–15.0)

## 2020-07-19 MED ORDER — SODIUM CHLORIDE 0.9% IV SOLUTION
Freq: Once | INTRAVENOUS | Status: AC
Start: 2020-07-19 — End: 2020-07-19

## 2020-07-19 MED ORDER — ROMIPLOSTIM 125 MCG ~~LOC~~ SOLR
1.0000 ug/kg | SUBCUTANEOUS | Status: DC
  Administered 2020-07-19 – 2020-07-26 (×2): 75 ug via SUBCUTANEOUS
  Filled 2020-07-19 (×2): qty 0.15

## 2020-07-19 MED ORDER — FUROSEMIDE 10 MG/ML IJ SOLN
40.0000 mg | Freq: Once | INTRAMUSCULAR | Status: AC
  Administered 2020-07-19: 40 mg via INTRAVENOUS
  Filled 2020-07-19: qty 4

## 2020-07-19 NOTE — Progress Notes (Addendum)
PROGRESS NOTE    Brenda [name]  TDD:[id number] DOB: 21-Dec-1960 DOA: 07/01/2020 PCP: Pcp, No     Brief Narrative:  HPI from Dr Loma Sender is a 60 years old female with past medical history of stage IV non-small cell lung cancer- adenocarcinoma of right lung, undergoing chemotherapy with Dr. Julien Nordmann, who presented tothehospital with epistaxis,dark stools and generalized weakness for few days. In the ED, lactic acid was >11, with INR 1.4 andhad AKI withcreatinine over 5 without hyperkalemia and metabolic acidosis. Patient received PRBC in the ED and was more somnolent and had focal seizure-like activity. She was given Ativan and Keppra andhead CT was done. Bear hugger was initiated and the patient was admitted to the hospital. Hemoglobin was around 2.4 subsequently and was given packed RBC. Of note patient was initiated on chemotherapy on 09/28/2019 for 11 cycles and current regimen of with carboplatin for an AUC of 5, Alimta 500 mg/m2, and Keytruda 200 mg IV every 3 weeks. Alimtawasheld due to renal insufficiency and neutropenia. During hospitalization, patient was seen by GI and oncology. Patient has been having pancytopenia with initial bleeding and conservative management has been pursued at this time with blood and platelet transfusion daily for the past 2 weeks.   New events last 24 hours / Subjective: Today, patient stating she wants to go home today.  Poor insight to her medical condition.  Had an extensive conversation with patient today, about her overall medical care and reason she is still in the hospital.  Patient verbalized understanding.  No further bleeding noted as of today.  Patient denies any chest pain, abdominal pain, nausea/vomiting, fever/chills.    Assessment & Plan:   Principal Problem:   Bacteremia associated with intravascular line ([place]) Active Problems:   Malignant neoplasm of right lung (HCC)   Hypokalemia   Acute on chronic anemia    Gastrointestinal hemorrhage   Pancytopenia (HCC)   Renal failure   AKI (acute kidney injury) ([place])   Acute blood loss anemia   Pancytopenia Likely secondary to chemotherapy toxicity Status post packed RBC and platelet transfusion on a daily basis for the past 2 weeks Pt received Granix as per oncology which has been completed- ANC improved Platelets with no significant improvement, despite daily transfusion, 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 Daily CBC  AKI Ongoing Likely secondary tochemotherapy and severe anemia with possible volume depletion. Received several liters of IV fluidduring hospitalization. Renal ultrasound was within normal limits. Baseline creatinine 1-1.4 Daily BMP  Acute GI bleed Patient had RBC scan showing mild acute GI bleeding in the small bowel. Unlikely to yield muchfrom angiogram/embolization as per GI. Had a BM with blood in it on 07/18/20, likely mucosal bleeding GIrecommended platelet and RBC transfusionas above.  Continue Protonix   RUE extremity phlebitis Improved  Stage IV lung cancer Adenocarcinomaby biopsy. Status post 2 cycles of pemetrexed and pembrolizumabm. Oncology on board  Seizure Isolated episode Unclear of etiology, possibility of hypoxia  Coagulase-negative staph bacteremia Only 1 set of blood culture was obtained. Both aerobic and anaerobic bottles were positive. Received vancomycin. Per ID continue cefazolin through 4/25, completed  Hypertension Continue Norvasc  GOC discussion Patient with overall poor prognosis, current stage IV lung adenocarcinoma with significant persistent pancytopenia Patient still wants to be full code Palliative care consulted, appreciate recs    DVT prophylaxis:  SCDs Start: 07/01/20 1431  Code Status: Full  code Family Communication: No family at bedside Disposition Plan:  Status is: Inpatient  Remains inpatient appropriate because:IV treatments appropriate due to intensity of illness or inability to take PO and Inpatient level of care appropriate due to severity of illness   Dispo: The patient is from: Home              Anticipated d/c is to: Home, patient declining SNF placement              Patient currently is not medically stable to d/c.               Difficult to place patient No   Antimicrobials:  Anti-infectives (From admission, onward)   Start     Dose/Rate Route Frequency Ordered Stop   07/08/20 1000  ceFAZolin (ANCEF) IVPB 2g/100 mL premix        2 g 200 mL/hr over 30 Minutes Intravenous Every 8 hours 07/08/20 0744 07/14/20 2359   07/06/20 2200  ceFAZolin (ANCEF) IVPB 1 g/50 mL premix  Status:  Discontinued        1 g 100 mL/hr over 30 Minutes Intravenous Every 12 hours 07/06/20 1058 07/08/20 0744   07/05/20 0600  vancomycin (VANCOREADY) IVPB 750 mg/150 mL        750 mg 150 mL/hr over 60 Minutes Intravenous  Once 07/04/20 1351 07/05/20 0712   07/03/20 1030  vancomycin (VANCOREADY) IVPB 750 mg/150 mL        750 mg 150 mL/hr over 60 Minutes Intravenous  Once 07/03/20 0942 07/03/20 1155   07/01/20 1145  ceFEPIme (MAXIPIME) 2 g in sodium chloride 0.9 % 100 mL IVPB        2 g 200 mL/hr over 30 Minutes Intravenous  Once 07/01/20 1140 07/01/20 1243   07/01/20 1145  vancomycin (VANCOCIN) IVPB 1000 mg/200 mL premix        1,000 mg 200 mL/hr over 60 Minutes Intravenous  Once 07/01/20 1140 07/01/20 1359       Objective: Vitals:   07/19/20 0556 07/19/20 1215 07/19/20 1234 07/19/20 1415  BP: (!) 156/102 (!) 150/87 (!) 149/89 135/84  Pulse: 90 92 95 97  Resp: 16 18 18 18   Temp: 98 F (36.7 C) 98.6 F (37 C) 98.2 F (36.8 C) 98.4 F (36.9 C)  TempSrc: Oral Oral  Oral  SpO2: 98% 96%    Weight:      Height:        Intake/Output Summary (Last 24 hours) at 07/19/2020  1445 Last data filed at 07/19/2020 1415 Gross per 24 hour  Intake 1687.04 ml  Output 2650 ml  Net -962.96 ml   Filed Weights   07/16/20 0500 07/17/20 0415 07/18/20 0645  Weight: 68.4 kg 69.8 kg 72.6 kg    Examination:  General: NAD, deconditioned, edematous   Cardiovascular: S1, S2 present  Respiratory: CTAB  Abdomen: Soft, nontender, nondistended, bowel sounds present  Musculoskeletal: 2+ bilateral pedal edema noted  Skin: Normal  Psychiatry: Normal mood    Data Reviewed: I have personally reviewed following labs and imaging studies  CBC: Recent Labs  Lab 07/16/20 0444 07/17/20 0403 07/18/20 0339 07/18/20 1630 07/19/20 0100 07/19/20 0902  WBC 4.0 5.1 5.6 6.6  --  7.3  NEUTROABS 2.6 3.0 3.5 4.2  --  4.8  HGB 9.4* 7.7* 8.4* 6.8* 8.4* 9.1*  HCT 29.1* 23.9* 25.5* 20.7* 24.3* 26.8*  MCV 90.7 89.5 87.6 87.7  --  84.5  PLT <5* <5* 9* 27*  --  8*   Basic Metabolic Panel: Recent Labs  Lab 07/13/20 0401 07/14/20 0353 07/15/20 0330 07/16/20 0444 07/18/20 0339 07/19/20 0902  NA 140 143 141 142 140 142  K 3.2* 3.6 3.3* 4.1 3.9 3.8  CL 107 111 111 111 111 112*  CO2 23 23 24 24 23 22   GLUCOSE 103* 108* 111* 183* 111* 101*  BUN 13 13 14 12  36* 54*  CREATININE 1.84* 1.93* 2.13* 2.09* 2.19* 2.15*  CALCIUM 7.6* 7.6* 7.6* 7.8* 7.4* 7.6*  MG 1.6* 1.8 1.6* 2.0  --   --   PHOS  --   --  2.6  --   --   --    GFR: Estimated Creatinine Clearance: 29.4 mL/min (A) (by C-G formula based on SCr of 2.15 mg/dL (H)). Liver Function Tests: Recent Labs  Lab 07/13/20 0401 07/14/20 0353 07/15/20 0330  AST 16 17  --   ALT <5 <5  --   ALKPHOS 111 122  --   BILITOT 0.8 0.4  --   PROT 4.7* 4.7*  --   ALBUMIN 1.3* 1.3* 1.5*   No results for input(s): LIPASE, AMYLASE in the last 168 hours. No results for input(s): AMMONIA in the last 168 hours. Coagulation Profile: No results for input(s): INR, PROTIME in the last 168 hours. Cardiac Enzymes: No results for input(s):  CKTOTAL, CKMB, CKMBINDEX, TROPONINI in the last 168 hours. BNP (last 3 results) No results for input(s): PROBNP in the last 8760 hours. HbA1C: No results for input(s): HGBA1C in the last 72 hours. CBG: Recent Labs  Lab 07/17/20 1809 07/18/20 0832 07/18/20 1623 07/19/20 0825 07/19/20 1140  GLUCAP 135* 106* 117* 102* 75   Lipid Profile: No results for input(s): CHOL, HDL, LDLCALC, TRIG, CHOLHDL, LDLDIRECT in the last 72 hours. Thyroid Function Tests: No results for input(s): TSH, T4TOTAL, FREET4, T3FREE, THYROIDAB in the last 72 hours. Anemia Panel: No results for input(s): VITAMINB12, FOLATE, FERRITIN, TIBC, IRON, RETICCTPCT in the last 72 hours. Sepsis Labs: No results for input(s): PROCALCITON, LATICACIDVEN in the last 168 hours.  No results found for this or any previous visit (from the past 240 hour(s)).    Radiology Studies: No results found.    Scheduled Meds: . Chlorhexidine Gluconate Cloth  6 each Topical Daily  . dexamethasone (DECADRON) injection  20 mg Intravenous Q24H  . ferrous sulfate  325 mg Oral BID WC  . folic acid  1 mg Oral Daily  . insulin aspart  0-9 Units Subcutaneous TID WC  . mouth rinse  15 mL Mouth Rinse BID  . pantoprazole  40 mg Oral BID  . romiPLOStim  1 mcg/kg Subcutaneous Weekly  . sodium chloride flush  10-40 mL Intracatheter Q12H   Continuous Infusions: . sodium chloride 50 mL/hr at 07/19/20 4076     LOS: 18 days     Alma Friendly, MD [facility] 07/19/2020, 2:45 PM   Available via Epic secure chat 7am-7pm After these hours, please refer to coverage provider listed on [email or web address]

## 2020-07-19 NOTE — Progress Notes (Signed)
Pt transferred from 3rd floor to 1411. Pt is alert and oriented in no apparent distress. She ordered dinner from service response as soon as she arrived. Full bedside report received. Bed in lowest and locked position. Call light within reach. Pt denied need of 3 Ps - potty, position, and pain. Pt advised to call before she moves out of bed. I will continue to monitor this patient with purposeful, hourly rounding.

## 2020-07-19 NOTE — Progress Notes (Signed)
HEMATOLOGY-ONCOLOGY PROGRESS NOTE  SUBJECTIVE: Nursing reports that she had an episode of GI bleeding yesterday.  He required PRBC transfusion.  Platelets remain low despite multiple platelet transfusions and dexamethasone 20 mg IV daily.  Oncology History  Malignant neoplasm of right lung ([place])  09/14/2019 Initial Diagnosis   Adenocarcinoma of right lung, stage 4 ([place])   09/28/2019 -  Chemotherapy    Patient is on Treatment Plan: LUNG CARBOPLATIN / PEMETREXED / PEMBROLIZUMAB Q21D INDUCTION X 4 CYCLES / MAINTENANCE PEMETREXED + PEMBROLIZUMAB      04/17/2020 Cancer Staging   Staging form: Lung, AJCC 8th Edition - Clinical: Stage IVA (cT2a, cN2, cM1b) - Signed by Curt Bears, MD on 04/17/2020      REVIEW OF SYSTEMS:   Constitutional: Denies fevers, chills Eyes: Denies blurriness of vision Ears, nose, mouth, throat, and face: Denies mucositis or sore throat Respiratory: Denies cough, dyspnea or wheezes Cardiovascular: Denies palpitation, chest discomfort Gastrointestinal: She developed recurrent GI bleeding yesterday. Skin: Denies abnormal skin rashes Lymphatics: Denies new lymphadenopathy or easy bruising Neurological:Denies numbness, tingling or new weaknesses Behavioral/Psych: Mood is stable, no new changes  Extremities: No lower extremity edema All other systems were reviewed with the patient and are negative.  I have reviewed the past medical history, past surgical history, social history and family history with the patient and they are unchanged from previous note.   PHYSICAL EXAMINATION: ECOG PERFORMANCE STATUS: 2 - Symptomatic, <50% confined to bed  Vitals:   07/19/20 0408 07/19/20 0556  BP: (!) 151/94 (!) 156/102  Pulse: 79 90  Resp: 16 16  Temp: 98 F (36.7 C) 98 F (36.7 C)  SpO2: 98% 98%   Filed Weights   07/16/20 0500 07/17/20 0415 07/18/20 0645  Weight: 68.4 kg 69.8 kg 72.6 kg    Intake/Output from previous day: 04/28 0701 - 04/29 0700 In: 2161.1  [P.O.:360; I.V.:722.3; Blood:1078.8] Out: 2650 [Urine:2650]  GENERAL:alert, no distress and comfortable SKIN: skin color, texture, turgor are normal, no rashes or significant lesions EYES: normal, Conjunctiva are pink and non-injected, sclera clear OROPHARYNX:no exudate, no erythema and lips, buccal mucosa, and tongue normal  LUNGS: clear to auscultation and percussion with normal breathing effort HEART: regular rate & rhythm and no murmurs and no lower extremity edema ABDOMEN:abdomen soft, non-tender and normal bowel sounds NEURO: alert & oriented x 3 with fluent speech, no focal motor/sensory deficits  LABORATORY DATA:  I have reviewed the data as listed CMP Latest Ref Rng & Units 07/19/2020 07/18/2020 07/16/2020  Glucose 70 - 99 mg/dL 101(H) 111(H) 183(H)  BUN 6 - 20 mg/dL 54(H) 36(H) 12  Creatinine 0.44 - 1.00 mg/dL 2.15(H) 2.19(H) 2.09(H)  Sodium 135 - 145 mmol/L 142 140 142  Potassium 3.5 - 5.1 mmol/L 3.8 3.9 4.1  Chloride 98 - 111 mmol/L 112(H) 111 111  CO2 22 - 32 mmol/L 22 23 24   Calcium 8.9 - 10.3 mg/dL 7.6(L) 7.4(L) 7.8(L)  Total Protein 6.5 - 8.1 g/dL - - -  Total Bilirubin 0.3 - 1.2 mg/dL - - -  Alkaline Phos 38 - 126 U/L - - -  AST 15 - 41 U/L - - -  ALT 0 - 44 U/L - - -    Lab Results  Component Value Date   WBC 7.3 07/19/2020   HGB 9.1 (L) 07/19/2020   HCT 26.8 (L) 07/19/2020   MCV 84.5 07/19/2020   PLT 8 (LL) 07/19/2020   NEUTROABS 4.8 07/19/2020    CT Head Wo Contrast  Result Date:  07/01/2020 CLINICAL DATA:  Recent seizure activity EXAM: CT HEAD WITHOUT CONTRAST TECHNIQUE: Contiguous axial images were obtained from the base of the skull through the vertex without intravenous contrast. COMPARISON:  04/07/2020 FINDINGS: Brain: Mild atrophic changes are noted. No findings to suggest acute hemorrhage, acute infarction or space-occupying mass lesion noted. Vascular: No hyperdense vessel or unexpected calcification. Skull: Normal. Negative for fracture or focal  lesion. Sinuses/Orbits: No acute finding. Chronic soft tissue changes are noted in the left ethmoid sinuses. Other: None. IMPRESSION: Mild atrophic changes without acute abnormality. Electronically Signed   By: Inez Catalina M.D.   On: 07/01/2020 15:05   NM GI Blood Loss  Result Date: 07/02/2020 CLINICAL DATA:  Acute lower GI bleeding, 3 episodes of large volume hematochezia. History of lung cancer EXAM: NUCLEAR MEDICINE GASTROINTESTINAL BLEEDING SCAN TECHNIQUE: Sequential abdominal images were obtained following intravenous administration of Tc-39mlabeled red blood cells. RADIOPHARMACEUTICALS:  22.7 mCi Tc-948mertechnetate in-vitro labeled red cells. COMPARISON:  05/21/2020 CT with contrast FINDINGS: Abnormal activity within the mid and lower abdomen extending into the upper pelvis and peristalsing throughout loops of bowel compatible with mild acute GI bleeding. Based on the pattern of peristalsis, it is difficult to determine location but small bowel is favored over colon. IMPRESSION: Positive exam for mild acute GI bleeding as above. These results will be called to the ordering clinician or representative by the Radiologist Assistant, and communication documented in the PACS or ClFrontier Oil CorporationElectronically Signed   By: M.Jerilynn Mages Shick M.D.   On: 07/02/2020 12:08   [place]  Result Date: 07/01/2020 CLINICAL DATA:  Acute kidney injury. EXAM: RENAL / URINARY TRACT ULTRASOUND COMPLETE COMPARISON:  CT abdomen pelvis dated May 21, 2020. FINDINGS: Right Kidney: Renal measurements: 10.0 x 4.2 x 5.0 cm = volume: 109 mL. Echogenicity within normal limits. No mass or hydronephrosis visualized. Left Kidney: Renal measurements: 9.1 x 5.1 x 5.3 cm = volume: 128 mL. Echogenicity within normal limits. No mass or hydronephrosis visualized. Other: Bilateral pleural effusions. IMPRESSION: 1. Normal renal ultrasound. 2. Bilateral pleural effusions. Electronically Signed   By: WiTitus Dubin.D.   On: 07/01/2020 15:55    DG CHEST PORT 1 VIEW  Result Date: 07/07/2020 CLINICAL DATA:  History of lung carcinoma with weakness EXAM: PORTABLE CHEST 1 VIEW COMPARISON:  07/01/2020 FINDINGS: Cardiac shadow is stable. Right-sided chest wall port is again noted and stable. New bilateral pleural effusions are noted left greater than right with likely underlying atelectasis. No acute bony abnormality is seen. IMPRESSION: New bilateral pleural effusions left greater than right. Electronically Signed   By: MaInez Catalina.D.   On: 07/07/2020 20:13   DG Chest Port 1 View  Result Date: 07/01/2020 CLINICAL DATA:  Shortness of breath EXAM: PORTABLE CHEST 1 VIEW COMPARISON:  April 04, 2019 FINDINGS: Port-A-Cath tip is in the superior vena cava. No pneumothorax. There is a rather minimal right pleural effusion. No edema or airspace opacity. Heart size and pulmonary vascularity are normal. No adenopathy. No bone lesions. IMPRESSION: Port-A-Cath as noted. Other minimal right pleural effusion. Lungs otherwise clear. Cardiac silhouette normal. Electronically Signed   By: WiLowella GripII M.D.   On: 07/01/2020 11:44    ASSESSMENT AND PLAN: This is a [age]ear old African-American female with stage IV (T2a, N2, M1b) non-small cell lung cancer, adenocarcinoma presented with a large right upper lobe lung mass in addition to suspicious right paratracheal lymphadenopathy and bilateral pulmonary nodules as well as retroperitoneal lymphadenopathy diagnosed in May 2021.  She has no actionable mutations and is negative for PD-L1.  The patient started palliative systemic chemotherapy with carboplatin for an AUC of 5, Alimta 500 mg/m, and Keytruda 200 mg IV every 3 weeks.  Starting from cycle #5, she has been on maintenance Alimta and Keytruda.  Her Alimta dose has been dose reduced secondary to neutropenia.  She has also required intermittent holding of Alimta due to renal insufficiency.  She last received her chemotherapy on 06/18/2020.  The  patient is now admitted GI bleed, pancytopenia, AKI, seizure-like activity, and probable sepsis with ?staph bacteremia.  Bleeding scan showed mild acute GI bleeding thought to be small bowel in origin.   The patient received Granix with improvement of her white blood cell count.  Her hemoglobin had initially improved, but she developed recurrent GI bleeding yesterday requiring PRBC transfusion.  I discussed the case with GI earlier today who feel that the diffuse bleeding is from her severe thrombocytopenia and endoscopic evaluation is not indicated.  I have ordered a unit of platelets to be transfused today.  Recommend platelet transfusion if platelet count less than 20,000 or active bleeding.  Continue dexamethasone 20 mg IV daily.  We will add Nplate 1 mcg/kg weekly.  Appreciate assistance of hospitalist and palliative care.  Recommend ongoing goals of care discussion with the patient.  I am concerned that she may not be able to get additional therapy for her lung cancer given her persistent thrombocytopenia.  We will plan to see the patient back in the office after discharge to discuss further treatment.  We will consider discontinuing Alimta and continuing maintenance immunotherapy with Keytruda only.   LOS: 18 days   Mikey Bussing, DNP, AGPCNP-BC, AOCNP 07/19/20

## 2020-07-19 NOTE — Progress Notes (Signed)
PT Cancellation Note  Patient Details Name: Brenda [name] MRN: [id number] DOB: 28-Aug-1960   Cancelled Treatment:    Reason Eval/Treat Not Completed: Other (comment) Checked in am and pt eating.  Returned in pm and pt refused despite encouragement.  Initially, stated she wanted to finish her nap - encouraged she was already awake now to try therapy.  Then stated , "well I need to use the bathroom."  Pt reports that we could assist with bsc transfer for her to actually get OOB to use bathroom but she refused stating "My tech is coming." Assured pt that tech wouldn't mind if therapy assisted but she refused.  Of note, tech arrived later at pt told her she wanted to nap before using bed pan. Tried to encourage importance of OOB activity or at least sitting EOB but she repeatedly refused.  Will f/u as able.  Abran Richard, PT Acute Rehab Services Pager [phone] [facility] [place] 07/19/2020, 2:57 PM

## 2020-07-20 DIAGNOSIS — C3491 Malignant neoplasm of unspecified part of right bronchus or lung: Secondary | ICD-10-CM | POA: Diagnosis not present

## 2020-07-20 DIAGNOSIS — T827XXD Infection and inflammatory reaction due to other cardiac and vascular devices, implants and grafts, subsequent encounter: Secondary | ICD-10-CM | POA: Diagnosis not present

## 2020-07-20 DIAGNOSIS — Z7189 Other specified counseling: Secondary | ICD-10-CM | POA: Diagnosis not present

## 2020-07-20 DIAGNOSIS — N179 Acute kidney failure, unspecified: Secondary | ICD-10-CM | POA: Diagnosis not present

## 2020-07-20 DIAGNOSIS — Z515 Encounter for palliative care: Secondary | ICD-10-CM | POA: Diagnosis not present

## 2020-07-20 DIAGNOSIS — D649 Anemia, unspecified: Secondary | ICD-10-CM | POA: Diagnosis not present

## 2020-07-20 LAB — TYPE AND SCREEN
ABO/RH(D): O POS
Antibody Screen: POSITIVE
Donor AG Type: NEGATIVE
Donor AG Type: NEGATIVE
Unit division: 0
Unit division: 0

## 2020-07-20 LAB — CBC WITH DIFFERENTIAL/PLATELET
Abs Immature Granulocytes: 0.17 10*3/uL — ABNORMAL HIGH (ref 0.00–0.07)
Basophils Absolute: 0 10*3/uL (ref 0.0–0.1)
Basophils Relative: 0 %
Eosinophils Absolute: 0 10*3/uL (ref 0.0–0.5)
Eosinophils Relative: 0 %
HCT: 23 % — ABNORMAL LOW (ref 36.0–46.0)
Hemoglobin: 7.7 g/dL — ABNORMAL LOW (ref 12.0–15.0)
Immature Granulocytes: 3 %
Lymphocytes Relative: 15 %
Lymphs Abs: 0.9 10*3/uL (ref 0.7–4.0)
MCH: 28.5 pg (ref 26.0–34.0)
MCHC: 33.5 g/dL (ref 30.0–36.0)
MCV: 85.2 fL (ref 80.0–100.0)
Monocytes Absolute: 0.9 10*3/uL (ref 0.1–1.0)
Monocytes Relative: 14 %
Neutro Abs: 4.2 10*3/uL (ref 1.7–7.7)
Neutrophils Relative %: 68 %
Platelets: 7 10*3/uL — CL (ref 150–400)
RBC: 2.7 MIL/uL — ABNORMAL LOW (ref 3.87–5.11)
RDW: 15.3 % (ref 11.5–15.5)
WBC: 6.2 10*3/uL (ref 4.0–10.5)
nRBC: 0 % (ref 0.0–0.2)

## 2020-07-20 LAB — BPAM RBC
Blood Product Expiration Date: 202205142359
Blood Product Expiration Date: 202205232359
ISSUE DATE / TIME: 202204271801
ISSUE DATE / TIME: 202204282005
Unit Type and Rh: 5100
Unit Type and Rh: 5100

## 2020-07-20 LAB — GLUCOSE, CAPILLARY
Glucose-Capillary: 84 mg/dL (ref 70–99)
Glucose-Capillary: 107 mg/dL — ABNORMAL HIGH (ref 70–99)
Glucose-Capillary: 158 mg/dL — ABNORMAL HIGH (ref 70–99)
Glucose-Capillary: 137 mg/dL — ABNORMAL HIGH (ref 70–99)

## 2020-07-20 LAB — PREPARE RBC (CROSSMATCH)

## 2020-07-20 LAB — BASIC METABOLIC PANEL
Anion gap: 8 (ref 5–15)
BUN: 56 mg/dL — ABNORMAL HIGH (ref 6–20)
CO2: 22 mmol/L (ref 22–32)
Calcium: 7.5 mg/dL — ABNORMAL LOW (ref 8.9–10.3)
Chloride: 113 mmol/L — ABNORMAL HIGH (ref 98–111)
Creatinine, Ser: 1.98 mg/dL — ABNORMAL HIGH (ref 0.44–1.00)
GFR, Estimated: 29 mL/min — ABNORMAL LOW (ref >60)
Glucose, Bld: 110 mg/dL — ABNORMAL HIGH (ref 70–99)
Potassium: 3.7 mmol/L (ref 3.5–5.1)
Sodium: 143 mmol/L (ref 135–145)

## 2020-07-20 LAB — PREALBUMIN: Prealbumin: 22.1 mg/dL (ref 18–38)

## 2020-07-20 MED ORDER — SODIUM CHLORIDE 0.9% IV SOLUTION: Freq: Once | INTRAVENOUS | Status: AC

## 2020-07-20 MED ORDER — FUROSEMIDE 10 MG/ML IJ SOLN: 40.0000 mg | Freq: Once | INTRAMUSCULAR | Status: DC

## 2020-07-20 MED ORDER — FUROSEMIDE 10 MG/ML IJ SOLN
40.0000 mg | Freq: Every day | INTRAMUSCULAR | Status: DC
  Administered 2020-07-20 – 2020-07-21 (×2): 40 mg via INTRAVENOUS
  Filled 2020-07-20 (×2): qty 4

## 2020-07-20 MED ORDER — SODIUM CHLORIDE 0.9% IV SOLUTION: Freq: Once | INTRAVENOUS | Status: DC

## 2020-07-20 NOTE — Progress Notes (Signed)
Daily Progress Note   Patient Name: Brenda [name]       Date: 07/20/2020 DOB: [birth year]  Age: 60 y.o. MRN#: [id number] Attending Physician: Alma Friendly, MD Primary Care Physician: Pcp, No Admit Date: 07/01/2020  Reason for Consultation/Follow-up: Establishing goals of care  Subjective:  awake alert Complains of weakness which is generalized, how ever, feels like she is getting better.   Length of Stay: 19  Current Medications: Scheduled Meds:  . sodium chloride   Intravenous Once  . Chlorhexidine Gluconate Cloth  6 each Topical Daily  . dexamethasone (DECADRON) injection  20 mg Intravenous Q24H  . ferrous sulfate  325 mg Oral BID WC  . folic acid  1 mg Oral Daily  . furosemide  40 mg Intravenous Once  . insulin aspart  0-9 Units Subcutaneous TID WC  . mouth rinse  15 mL Mouth Rinse BID  . pantoprazole  40 mg Oral BID  . romiPLOStim  1 mcg/kg Subcutaneous Weekly  . sodium chloride flush  10-40 mL Intracatheter Q12H    Continuous Infusions:   PRN Meds: acetaminophen, alum & mag hydroxide-simeth, diphenhydrAMINE, docusate sodium, Gerhardt's butt cream, hydrALAZINE, hydrocortisone, ipratropium-albuterol, loperamide, ondansetron (ZOFRAN) IV, sodium chloride flush, traMADol  Physical Exam         Awake alert No distress Regular work of breathing S 1 S 2  Abdomen not tender Some edema Non focal  Vital Signs: BP (!) 143/80   Pulse 95   Temp 98 F (36.7 C) (Oral)   Resp 19   Ht 5\' 6"  (1.676 m)   Wt 73.5 kg   LMP 01/19/2011   SpO2 93%   BMI 26.16 kg/m  SpO2: SpO2: 93 % O2 Device: O2 Device: Room Air O2 Flow Rate: O2 Flow Rate (L/min): 2 L/min  Intake/output summary:   Intake/Output Summary (Last 24 hours) at 07/20/2020 3790 Last data filed at 07/20/2020  2409 Gross per 24 hour  Intake 1385.59 ml  Output 2950 ml  Net -1564.41 ml   LBM: Last BM Date: 07/18/20 Baseline Weight: Weight: 52.6 kg Most recent weight: Weight: 73.5 kg       Palliative Assessment/Data: PPS 50%     Patient Active Problem List   Diagnosis Date Noted  . Bacteremia associated with intravascular line ([place]) 07/03/2020  . Acute blood loss anemia   .  Renal failure 07/01/2020  . AKI (acute kidney injury) ([place])   . Metabolic acidosis   . Epistaxis   . Seizure-like activity ([place])   . Swelling of lower extremity 05/29/2020  . Hypoalbuminemia 05/29/2020  . Acute on chronic anemia 04/07/2020  . Gastrointestinal hemorrhage 04/07/2020  . Acute encephalopathy 04/07/2020  . Pancytopenia ([place]) 04/07/2020  . Polysubstance abuse ([place]) 04/07/2020  . Hypokalemia 10/19/2019  . Neutropenia ([place]) 10/05/2019  . Malignant neoplasm of right lung ([place]) 09/14/2019  . Encounter for antineoplastic chemotherapy 09/14/2019  . Encounter for antineoplastic immunotherapy 09/14/2019  . Goals of care, counseling/discussion 09/14/2019  . Tobacco abuse counseling 09/14/2019  . 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 Alcohol abuse   . Acute respiratory failure ([place]) 04/03/2019  . Lung mass   . Multifocal pneumonia   . Major depressive disorder   . Major depressive disorder, recurrent episode with mood-congruent psychotic features ([place]) 05/30/2017  . 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 Earlie Server for stage IV non-small cell lung cancer adenocarcinoma undergoing chemotherapy.  Patient presented with generalized weakness epistaxis and dark stools.  Admitted through the emergency department to hospital medicine service.  Hospital course also complicated by possible focal seizure type activity.  Patient given packed red blood cell transfusion and is also status post  multiple platelet transfusions.  Has also received Granix.  A palliative medicine consultation has been deemed appropriate for CODE STATUS and goals of care discussions given patient's serious illness and current hospitalization.  Assessment:  generalized weakness.   Recommendations/Plan:  Goals of care discussed with patient: full code, full scope care. She is hopeful for receiving further cancer directed treatment, she is open to SNF rehab with palliative care to follow.  No additional PMT specific recommendations at this time.    Goals of Care and Additional Recommendations:  Limitations on Scope of Treatment: Full Scope Treatment  Code Status:    Code Status Orders  (From admission, onward)         Start     Ordered   07/01/20 1433  Full code  Continuous        07/01/20 1433        Code Status History    Date Active Date Inactive Code Status Order ID Comments User Context   04/07/2020 2042 04/11/2020 1909 Full Code [id number]  Rhetta Mura, DO ED   04/03/2019 1507 04/05/2019 2210 Full Code [id number]  Lyndee Hensen, DO ED   05/30/2017 1558 06/03/2017 0006 Full Code [id number]  Patrecia Pour, NP Inpatient   05/29/2017 1508 05/30/2017 1525 Full Code [id number]  Isla Pence, MD ED   12/24/2011 0140 12/25/2011 2040 Full Code [id number]  Truddie Hidden., MD ED   Advance Care Planning Activity       Prognosis:   Unable to determine  Discharge Planning:  [place] for rehab with Palliative care service follow-up  Care plan was discussed with  Patient.   Thank you for allowing the Palliative Medicine Team to assist in the care of this patient.   Time In: 9 Time Out: 9.15 Total Time 15 Prolonged Time Billed No       Greater than 50%  of this time was spent counseling and coordinating care related to the above assessment and plan.  Loistine Chance, MD  Please contact Palliative Medicine Team phone at [phone] for questions and concerns.

## 2020-07-20 NOTE — Progress Notes (Signed)
Md notified of delay of blood administration due to pt sample having antibodies causing longer to get blood for patient.

## 2020-07-20 NOTE — Plan of Care (Signed)
  Problem: Clinical Measurements: Goal: Diagnostic test results will improve Outcome: Progressing   Problem: Clinical Measurements: Goal: Cardiovascular complication will be avoided Outcome: Progressing   Problem: Activity: Goal: Risk for activity intolerance will decrease Outcome: Progressing   Problem: Safety: Goal: Ability to remain free from injury will improve Outcome: Progressing   Problem: Skin Integrity: Goal: Risk for impaired skin integrity will decrease Outcome: Progressing

## 2020-07-20 NOTE — Progress Notes (Signed)
PROGRESS NOTE    Brenda [name]  OIN:[id number] DOB: 1960/12/19 DOA: 07/01/2020 PCP: Pcp, No     Brief Narrative:  HPI from Dr Loma Sender is a 60 years old female with past medical history of stage IV non-small cell lung cancer- adenocarcinoma of right lung, undergoing chemotherapy with Dr. Julien Nordmann, who presented tothehospital with epistaxis,dark stools and generalized weakness for few days. In the ED, lactic acid was >11, with INR 1.4 andhad AKI withcreatinine over 5 without hyperkalemia and metabolic acidosis. Patient received PRBC in the ED and was more somnolent and had focal seizure-like activity. She was given Ativan and Keppra andhead CT was done. Bear hugger was initiated and the patient was admitted to the hospital. Hemoglobin was around 2.4 subsequently and was given packed RBC. Of note patient was initiated on chemotherapy on 09/28/2019 for 11 cycles and current regimen of with carboplatin for an AUC of 5, Alimta 500 mg/m2, and Keytruda 200 mg IV every 3 weeks. Alimtawasheld due to renal insufficiency and neutropenia. During hospitalization, patient was seen by GI and oncology. Patient has been having pancytopenia with initial bleeding and conservative management has been pursued at this time with blood and platelet transfusion daily for the past 2 weeks.   New events last 24 hours / Subjective: Day, patient denies any new complaints, denies any chest pain, abdominal pain, nausea/vomiting, fever/chills.  Noted to be edematous especially in BUE, abdomen and bilateral thigh.    Assessment & Plan:   Principal Problem:   Bacteremia associated with intravascular line ([place]) Active Problems:   Malignant neoplasm of right lung ([facility])   Hypokalemia   Acute on chronic anemia   Gastrointestinal hemorrhage   Pancytopenia ([facility])   Renal failure   AKI (acute kidney injury) ([place])   Acute blood loss anemia   Pancytopenia Likely secondary to chemotherapy  toxicity Status post packed RBC and platelet transfusion on a daily basis for the past 2 weeks Pt received Granix as per oncology which has been completed- ANC improved Platelets with no significant improvement, despite daily transfusion, 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 Daily CBC  AKI Ongoing Likely secondary tochemotherapy and severe anemia with possible volume depletion. Received several liters of IV fluidduring hospitalization. Renal ultrasound was within normal limits. Baseline creatinine 1-1.4 Daily BMP  Acute GI bleed Patient had RBC scan showing mild acute GI bleeding in the small bowel. Unlikely to yield muchfrom angiogram/embolization as per GI. Had a BM with blood in it on 07/18/20, likely mucosal bleeding GIrecommended platelet and RBC transfusionas above.  Continue Protonix   Anasarca Possibly third spacing, multiple transfusions Edematous around BUE, abdomen and bilateral thigh Prealbumin 22.1 BUE Doppler pending Daily Lasix for now and monitor renal function closely  RUE extremity phlebitis Improved  Stage IV lung cancer Adenocarcinomaby biopsy. Status post 2 cycles of pemetrexed and pembrolizumabm. Oncology on board  Seizure Isolated episode Unclear of etiology, possibility of hypoxia  Coagulase-negative staph bacteremia Only 1 set of blood culture was obtained. Both aerobic and anaerobic bottles were positive. Received vancomycin. Per ID continue cefazolin through 4/25, completed  Hypertension Continue Norvasc  GOC discussion Patient with overall poor prognosis, current stage IV lung adenocarcinoma with significant persistent pancytopenia Patient still wants to be full code Palliative care consulted, appreciate recs    DVT prophylaxis:  SCDs Start: 07/01/20  1431  Code Status: Full code Family Communication: No family at bedside Disposition Plan:  Status is: Inpatient  Remains inpatient appropriate because:IV treatments appropriate due to intensity of illness or inability to take PO and Inpatient level of care appropriate due to severity of illness   Dispo: The patient is from: Home              Anticipated d/c is to: Home, patient declining SNF placement              Patient currently is not medically stable to d/c.               Difficult to place patient No   Antimicrobials:  Anti-infectives (From admission, onward)   Start     Dose/Rate Route Frequency Ordered Stop   07/08/20 1000  ceFAZolin (ANCEF) IVPB 2g/100 mL premix        2 g 200 mL/hr over 30 Minutes Intravenous Every 8 hours 07/08/20 0744 07/14/20 2359   07/06/20 2200  ceFAZolin (ANCEF) IVPB 1 g/50 mL premix  Status:  Discontinued        1 g 100 mL/hr over 30 Minutes Intravenous Every 12 hours 07/06/20 1058 07/08/20 0744   07/05/20 0600  vancomycin (VANCOREADY) IVPB 750 mg/150 mL        750 mg 150 mL/hr over 60 Minutes Intravenous  Once 07/04/20 1351 07/05/20 0712   07/03/20 1030  vancomycin (VANCOREADY) IVPB 750 mg/150 mL        750 mg 150 mL/hr over 60 Minutes Intravenous  Once 07/03/20 0942 07/03/20 1155   07/01/20 1145  ceFEPIme (MAXIPIME) 2 g in sodium chloride 0.9 % 100 mL IVPB        2 g 200 mL/hr over 30 Minutes Intravenous  Once 07/01/20 1140 07/01/20 1243   07/01/20 1145  vancomycin (VANCOCIN) IVPB 1000 mg/200 mL premix        1,000 mg 200 mL/hr over 60 Minutes Intravenous  Once 07/01/20 1140 07/01/20 1359       Objective: Vitals:   07/20/20 0717 07/20/20 0752 07/20/20 0959 07/20/20 1140  BP: (!) 143/80  (!) 149/80 139/86  Pulse: 95  90 87  Resp: (!) 22 19 20 18   Temp: 98 F (36.7 C)  98 F (36.7 C) 98 F (36.7 C)  TempSrc: Oral  Oral Oral  SpO2: 93%  100% 100%  Weight:      Height:        Intake/Output Summary (Last 24 hours) at 07/20/2020  1441 Last data filed at 07/20/2020 1338 Gross per 24 hour  Intake 1725.59 ml  Output 4750 ml  Net -3024.41 ml   Filed Weights   07/17/20 0415 07/18/20 0645 07/20/20 0411  Weight: 69.8 kg 72.6 kg 73.5 kg    Examination:  General: NAD, deconditioned, edematous (BUE, abdomen, b/l thigh)  Cardiovascular: S1, S2 present  Respiratory: CTAB  Abdomen: Soft, nontender, nondistended, bowel sounds present  Musculoskeletal: 1+ bilateral pedal edema noted  Skin: Normal  Psychiatry: Normal mood    Data Reviewed: I have personally reviewed following labs and imaging studies  CBC: Recent Labs  Lab 07/17/20 0403 07/18/20 0339 07/18/20 1630 07/19/20 0100 07/19/20 0902 07/20/20 0357  WBC 5.1 5.6 6.6  --  7.3 6.2  NEUTROABS 3.0 3.5 4.2  --  4.8 4.2  HGB 7.7* 8.4* 6.8* 8.4* 9.1* 7.7*  HCT 23.9* 25.5* 20.7* 24.3* 26.8* 23.0*  MCV 89.5 87.6 87.7  --  84.5 85.2  PLT <5* 9* 27*  --  8* 7*   Basic Metabolic Panel: Recent Labs  Lab 07/14/20 0353 07/15/20 0330 07/16/20 0444 07/18/20 0339 07/19/20 0902 07/20/20 0357  NA 143 141 142 140 142 143  K 3.6 3.3* 4.1 3.9 3.8 3.7  CL 111 111 111 111 112* 113*  CO2 23 24 24 23 22 22   GLUCOSE 108* 111* 183* 111* 101* 110*  BUN 13 14 12  36* 54* 56*  CREATININE 1.93* 2.13* 2.09* 2.19* 2.15* 1.98*  CALCIUM 7.6* 7.6* 7.8* 7.4* 7.6* 7.5*  MG 1.8 1.6* 2.0  --   --   --   PHOS  --  2.6  --   --   --   --    GFR: Estimated Creatinine Clearance: 32.2 mL/min (A) (by C-G formula based on SCr of 1.98 mg/dL (H)). Liver Function Tests: Recent Labs  Lab 07/14/20 0353 07/15/20 0330  AST 17  --   ALT <5  --   ALKPHOS 122  --   BILITOT 0.4  --   PROT 4.7*  --   ALBUMIN 1.3* 1.5*   No results for input(s): LIPASE, AMYLASE in the last 168 hours. No results for input(s): AMMONIA in the last 168 hours. Coagulation Profile: No results for input(s): INR, PROTIME in the last 168 hours. Cardiac Enzymes: No results for input(s): CKTOTAL, CKMB,  CKMBINDEX, TROPONINI in the last 168 hours. BNP (last 3 results) No results for input(s): PROBNP in the last 8760 hours. HbA1C: No results for input(s): HGBA1C in the last 72 hours. CBG: Recent Labs  Lab 07/19/20 1140 07/19/20 1658 07/19/20 2114 07/20/20 0759 07/20/20 1137  GLUCAP 75 93 158* 84 107*   Lipid Profile: No results for input(s): CHOL, HDL, LDLCALC, TRIG, CHOLHDL, LDLDIRECT in the last 72 hours. Thyroid Function Tests: No results for input(s): TSH, T4TOTAL, FREET4, T3FREE, THYROIDAB in the last 72 hours. Anemia Panel: No results for input(s): VITAMINB12, FOLATE, FERRITIN, TIBC, IRON, RETICCTPCT in the last 72 hours. Sepsis Labs: No results for input(s): PROCALCITON, LATICACIDVEN in the last 168 hours.  No results found for this or any previous visit (from the past 240 hour(s)).    Radiology Studies: No results found.    Scheduled Meds: . sodium chloride   Intravenous Once  . Chlorhexidine Gluconate Cloth  6 each Topical Daily  . dexamethasone (DECADRON) injection  20 mg Intravenous Q24H  . ferrous sulfate  325 mg Oral BID WC  . folic acid  1 mg Oral Daily  . furosemide  40 mg Intravenous Once  . insulin aspart  0-9 Units Subcutaneous TID WC  . mouth rinse  15 mL Mouth Rinse BID  . pantoprazole  40 mg Oral BID  . romiPLOStim  1 mcg/kg Subcutaneous Weekly  . sodium chloride flush  10-40 mL Intracatheter Q12H   Continuous Infusions:    LOS: 19 days     Alma Friendly, MD [facility] 07/20/2020, 2:41 PM   Available via Epic secure chat 7am-7pm After these hours, please refer to coverage provider listed on [email or web address]

## 2020-07-20 NOTE — Plan of Care (Signed)

## 2020-07-20 NOTE — Progress Notes (Signed)
   07/20/20 0447  Provider Notification  Provider Name/Title Hal Hope, MD  Date Provider Notified 07/20/20  Time Provider Notified [phone]  Notification Type Page  Notification Reason Critical result (Platelet 7)  Test performed and critical result platelet 7  Date Critical Result Received 07/20/20  Time Critical Result Received 9191  Provider response See new orders  Date of Provider Response 07/20/20  Time of Provider Response 0500

## 2020-07-21 ENCOUNTER — Inpatient Hospital Stay (HOSPITAL_COMMUNITY): Payer: Medicaid Other

## 2020-07-21 DIAGNOSIS — T827XXD Infection and inflammatory reaction due to other cardiac and vascular devices, implants and grafts, subsequent encounter: Secondary | ICD-10-CM | POA: Diagnosis not present

## 2020-07-21 DIAGNOSIS — R609 Edema, unspecified: Secondary | ICD-10-CM | POA: Diagnosis not present

## 2020-07-21 DIAGNOSIS — D649 Anemia, unspecified: Secondary | ICD-10-CM | POA: Diagnosis not present

## 2020-07-21 DIAGNOSIS — N179 Acute kidney failure, unspecified: Secondary | ICD-10-CM | POA: Diagnosis not present

## 2020-07-21 DIAGNOSIS — C3491 Malignant neoplasm of unspecified part of right bronchus or lung: Secondary | ICD-10-CM | POA: Diagnosis not present

## 2020-07-21 LAB — CBC WITH DIFFERENTIAL/PLATELET
Abs Immature Granulocytes: 0.12 10*3/uL — ABNORMAL HIGH (ref 0.00–0.07)
Basophils Absolute: 0 10*3/uL (ref 0.0–0.1)
Basophils Relative: 0 %
Eosinophils Absolute: 0 10*3/uL (ref 0.0–0.5)
Eosinophils Relative: 0 %
HCT: 33.3 % — ABNORMAL LOW (ref 36.0–46.0)
Hemoglobin: 11 g/dL — ABNORMAL LOW (ref 12.0–15.0)
Immature Granulocytes: 2 %
Lymphocytes Relative: 16 %
Lymphs Abs: 1.2 10*3/uL (ref 0.7–4.0)
MCH: 28.6 pg (ref 26.0–34.0)
MCHC: 33 g/dL (ref 30.0–36.0)
MCV: 86.7 fL (ref 80.0–100.0)
Monocytes Absolute: 1 10*3/uL (ref 0.1–1.0)
Monocytes Relative: 14 %
Neutro Abs: 5.1 10*3/uL (ref 1.7–7.7)
Neutrophils Relative %: 68 %
Platelets: 9 10*3/uL — CL (ref 150–400)
RBC: 3.84 MIL/uL — ABNORMAL LOW (ref 3.87–5.11)
RDW: 14.6 % (ref 11.5–15.5)
WBC: 7.4 10*3/uL (ref 4.0–10.5)
nRBC: 0 % (ref 0.0–0.2)

## 2020-07-21 LAB — GLUCOSE, CAPILLARY
Glucose-Capillary: 89 mg/dL (ref 70–99)
Glucose-Capillary: 112 mg/dL — ABNORMAL HIGH (ref 70–99)
Glucose-Capillary: 100 mg/dL — ABNORMAL HIGH (ref 70–99)

## 2020-07-21 LAB — BASIC METABOLIC PANEL WITH GFR
Anion gap: 9 (ref 5–15)
BUN: 48 mg/dL — ABNORMAL HIGH (ref 6–20)
CO2: 25 mmol/L (ref 22–32)
Calcium: 7.7 mg/dL — ABNORMAL LOW (ref 8.9–10.3)
Chloride: 109 mmol/L (ref 98–111)
Creatinine, Ser: 2.17 mg/dL — ABNORMAL HIGH (ref 0.44–1.00)
GFR, Estimated: 26 mL/min — ABNORMAL LOW (ref >60)
Glucose, Bld: 138 mg/dL — ABNORMAL HIGH (ref 70–99)
Potassium: 3.1 mmol/L — ABNORMAL LOW (ref 3.5–5.1)
Sodium: 143 mmol/L (ref 135–145)

## 2020-07-21 LAB — PREPARE PLATELET PHERESIS
Unit division: 0
Unit division: 0

## 2020-07-21 LAB — BPAM PLATELET PHERESIS
Blood Product Expiration Date: 202205012359
Blood Product Expiration Date: 202205032359
ISSUE DATE / TIME: 202204291212
ISSUE DATE / TIME: 202204300656
Unit Type and Rh: 7300
Unit Type and Rh: 6200

## 2020-07-21 MED ORDER — SODIUM CHLORIDE 0.9% IV SOLUTION: Freq: Once | INTRAVENOUS | Status: AC

## 2020-07-21 MED ORDER — FUROSEMIDE 10 MG/ML IJ SOLN: 20.0000 mg | Freq: Every day | INTRAMUSCULAR | Status: DC

## 2020-07-21 MED ORDER — POTASSIUM CHLORIDE CRYS ER 20 MEQ PO TBCR
40.0000 meq | EXTENDED_RELEASE_TABLET | Freq: Every day | ORAL | Status: DC
  Administered 2020-07-21 – 2020-07-25 (×5): 40 meq via ORAL
  Filled 2020-07-21 (×5): qty 2

## 2020-07-21 NOTE — Progress Notes (Signed)
Upper extremity venous has been completed.   Preliminary results in CV Proc.   Brenda [name] 07/21/2020 8:47 AM

## 2020-07-21 NOTE — Plan of Care (Signed)
  Problem: Clinical Measurements: Goal: Will remain free from infection Outcome: Progressing   Problem: Clinical Measurements: Goal: Diagnostic test results will improve Outcome: Progressing   Problem: Clinical Measurements: Goal: Cardiovascular complication will be avoided Outcome: Progressing   Problem: Coping: Goal: Level of anxiety will decrease Outcome: Progressing   Problem: Elimination: Goal: Will not experience complications related to bowel motility Outcome: Progressing   Problem: Skin Integrity: Goal: Risk for impaired skin integrity will decrease Outcome: Progressing

## 2020-07-21 NOTE — Progress Notes (Signed)
CRITICAL VALUE STICKER  CRITICAL VALUE: small superficial blood clot in left cephalic vein  RECEIVER (on-site recipient of call): Tina Temme  DATE & TIME NOTIFIED: 07-21-20 at [place] (representative from lab): ultrasound tech  MD NOTIFIED: Dr. Horris Latino via [place]: 0840  RESPONSE: awaiting call back

## 2020-07-21 NOTE — Progress Notes (Signed)
Rn paged md via amion about doppler findings of a small superficial blood clot in the cephalic vein in the left arm. Rn awaiting call back.

## 2020-07-21 NOTE — Progress Notes (Signed)
I agree with previous RN's assessment.

## 2020-07-21 NOTE — Progress Notes (Signed)
PROGRESS NOTE    Brenda [name]  IZT:[id number] DOB: Aug 21, 1960 DOA: 07/01/2020 PCP: Pcp, No     Brief Narrative:  HPI from Dr Loma Sender is a 60 years old female with past medical history of stage IV non-small cell lung cancer- adenocarcinoma of right lung, undergoing chemotherapy with Dr. Julien Nordmann, who presented tothehospital with epistaxis,dark stools and generalized weakness for few days. In the ED, lactic acid was >11, with INR 1.4 andhad AKI withcreatinine over 5 without hyperkalemia and metabolic acidosis. Patient received PRBC in the ED and was more somnolent and had focal seizure-like activity. She was given Ativan and Keppra andhead CT was done. Bear hugger was initiated and the patient was admitted to the hospital. Hemoglobin was around 2.4 subsequently and was given packed RBC. Of note patient was initiated on chemotherapy on 09/28/2019 for 11 cycles and current regimen of with carboplatin for an AUC of 5, Alimta 500 mg/m2, and Keytruda 200 mg IV every 3 weeks. Alimtawasheld due to renal insufficiency and neutropenia. During hospitalization, patient was seen by GI and oncology. Patient has been having pancytopenia with initial bleeding and conservative management has been pursued at this time with blood and platelet transfusion daily for the past 2 weeks.   New events last 24 hours / Subjective: Today, patient denies any new complaints, still edematous.    Assessment & Plan:   Principal Problem:   Bacteremia associated with intravascular line ([place]) Active Problems:   Malignant neoplasm of right lung (HCC)   Hypokalemia   Acute on chronic anemia   Gastrointestinal hemorrhage   Pancytopenia (HCC)   Renal failure   AKI (acute kidney injury) ([place])   Acute blood loss anemia   Pancytopenia Likely secondary to chemotherapy toxicity Status post packed RBC and platelet transfusion on a daily basis for the past 2 weeks Pt received Granix as per oncology  which has been completed- ANC improved Platelets with no significant improvement, despite daily transfusion, 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 Daily CBC  AKI Ongoing Likely secondary tochemotherapy and severe anemia with possible volume depletion. Received several liters of IV fluidduring hospitalization. Renal ultrasound was within normal limits. Baseline creatinine 1-1.4 Daily BMP  Acute GI bleed Patient had RBC scan showing mild acute GI bleeding in the small bowel. Unlikely to yield muchfrom angiogram/embolization as per GI. Had a BM with blood in it on 07/18/20, likely mucosal bleeding GIrecommended platelet and RBC transfusionas above.  Continue Protonix   Anasarca Possibly third spacing, multiple transfusions Edematous around BUE, abdomen and bilateral thigh Prealbumin 22.1 Lasix as needed  RUE extremity phlebitis Indeterminant superficial vein thrombosis involving the left cephalic vein BUE Doppler showed above We will continue to monitor  Stage IV lung cancer Adenocarcinomaby biopsy. Status post 2 cycles of pemetrexed and pembrolizumabm. Oncology on board  Seizure Isolated episode Unclear of etiology, possibility of hypoxia  Coagulase-negative staph bacteremia Only 1 set of blood culture was obtained. Both aerobic and anaerobic bottles were positive. Received vancomycin. Per ID continue cefazolin through 4/25, completed  Hypertension Continue Norvasc  GOC discussion Patient with overall poor prognosis, current stage IV lung adenocarcinoma with significant persistent pancytopenia Patient still wants to be full code Palliative care consulted, appreciate recs    DVT prophylaxis:  SCDs Start: 07/01/20 1431  Code Status: Full code Family Communication: No family at  bedside Disposition Plan:  Status is: Inpatient  Remains inpatient appropriate because:IV treatments appropriate due to intensity of illness or inability to take PO and Inpatient level of care appropriate due to severity of illness   Dispo: The patient is from: Home              Anticipated d/c is to: Home, patient declining SNF placement              Patient currently is not medically stable to d/c.               Difficult to place patient No   Antimicrobials:  Anti-infectives (From admission, onward)   Start     Dose/Rate Route Frequency Ordered Stop   07/08/20 1000  ceFAZolin (ANCEF) IVPB 2g/100 mL premix        2 g 200 mL/hr over 30 Minutes Intravenous Every 8 hours 07/08/20 0744 07/14/20 2359   07/06/20 2200  ceFAZolin (ANCEF) IVPB 1 g/50 mL premix  Status:  Discontinued        1 g 100 mL/hr over 30 Minutes Intravenous Every 12 hours 07/06/20 1058 07/08/20 0744   07/05/20 0600  vancomycin (VANCOREADY) IVPB 750 mg/150 mL        750 mg 150 mL/hr over 60 Minutes Intravenous  Once 07/04/20 1351 07/05/20 0712   07/03/20 1030  vancomycin (VANCOREADY) IVPB 750 mg/150 mL        750 mg 150 mL/hr over 60 Minutes Intravenous  Once 07/03/20 0942 07/03/20 1155   07/01/20 1145  ceFEPIme (MAXIPIME) 2 g in sodium chloride 0.9 % 100 mL IVPB        2 g 200 mL/hr over 30 Minutes Intravenous  Once 07/01/20 1140 07/01/20 1243   07/01/20 1145  vancomycin (VANCOCIN) IVPB 1000 mg/200 mL premix        1,000 mg 200 mL/hr over 60 Minutes Intravenous  Once 07/01/20 1140 07/01/20 1359       Objective: Vitals:   07/21/20 0545 07/21/20 1339 07/21/20 1343 07/21/20 1358  BP: (!) 159/89 (!) 147/86 (!) 145/85 (!) 142/85  Pulse: 77 80 87 87  Resp: 13 20 18 18   Temp: 97.9 F (36.6 C) 97.7 F (36.5 C) 97.7 F (36.5 C) 97.6 F (36.4 C)  TempSrc: Oral Oral Oral Oral  SpO2: 100% 97% 98% 98%  Weight:      Height:        Intake/Output Summary (Last 24 hours) at 07/21/2020 1558 Last data filed at  07/21/2020 1555 Gross per 24 hour  Intake 3338 ml  Output 3450 ml  Net -112 ml   Filed Weights   07/17/20 0415 07/18/20 0645 07/20/20 0411  Weight: 69.8 kg 72.6 kg 73.5 kg    Examination:  General: NAD, deconditioned, edematous (BUE, abdomen, b/l thigh)  Cardiovascular: S1, S2 present  Respiratory: CTAB  Abdomen: Soft, nontender, nondistended, bowel sounds present  Musculoskeletal: 1+ bilateral pedal edema noted  Skin: Normal  Psychiatry: Normal mood    Data Reviewed: I have personally reviewed following labs and imaging studies  CBC: Recent Labs  Lab 07/18/20 0339 07/18/20 1630 07/19/20 0100 07/19/20 0902 07/20/20 0357 07/21/20 0900  WBC 5.6 6.6  --  7.3 6.2 7.4  NEUTROABS 3.5 4.2  --  4.8 4.2 5.1  HGB 8.4* 6.8* 8.4* 9.1* 7.7* 11.0*  HCT 25.5* 20.7* 24.3* 26.8* 23.0* 33.3*  MCV 87.6 87.7  --  84.5 85.2 86.7  PLT 9* 27*  --  8* 7* 9*  Basic Metabolic Panel: Recent Labs  Lab 07/15/20 0330 07/16/20 0444 07/18/20 0339 07/19/20 0902 07/20/20 0357 07/21/20 0900  NA 141 142 140 142 143 143  K 3.3* 4.1 3.9 3.8 3.7 3.1*  CL 111 111 111 112* 113* 109  CO2 24 24 23 22 22 25   GLUCOSE 111* 183* 111* 101* 110* 138*  BUN 14 12 36* 54* 56* 48*  CREATININE 2.13* 2.09* 2.19* 2.15* 1.98* 2.17*  CALCIUM 7.6* 7.8* 7.4* 7.6* 7.5* 7.7*  MG 1.6* 2.0  --   --   --   --   PHOS 2.6  --   --   --   --   --    GFR: Estimated Creatinine Clearance: 29.4 mL/min (A) (by C-G formula based on SCr of 2.17 mg/dL (H)). Liver Function Tests: Recent Labs  Lab 07/15/20 0330  ALBUMIN 1.5*   No results for input(s): LIPASE, AMYLASE in the last 168 hours. No results for input(s): AMMONIA in the last 168 hours. Coagulation Profile: No results for input(s): INR, PROTIME in the last 168 hours. Cardiac Enzymes: No results for input(s): CKTOTAL, CKMB, CKMBINDEX, TROPONINI in the last 168 hours. BNP (last 3 results) No results for input(s): PROBNP in the last 8760 hours. HbA1C: No  results for input(s): HGBA1C in the last 72 hours. CBG: Recent Labs  Lab 07/20/20 1137 07/20/20 1655 07/20/20 2118 07/21/20 0822 07/21/20 1149  GLUCAP 107* 158* 137* 89 112*   Lipid Profile: No results for input(s): CHOL, HDL, LDLCALC, TRIG, CHOLHDL, LDLDIRECT in the last 72 hours. Thyroid Function Tests: No results for input(s): TSH, T4TOTAL, FREET4, T3FREE, THYROIDAB in the last 72 hours. Anemia Panel: No results for input(s): VITAMINB12, FOLATE, FERRITIN, TIBC, IRON, RETICCTPCT in the last 72 hours. Sepsis Labs: No results for input(s): PROCALCITON, LATICACIDVEN in the last 168 hours.  No results found for this or any previous visit (from the past 240 hour(s)).    Radiology Studies: VAS [place] UPPER EXTREMITY VENOUS DUPLEX  Result Date: 07/21/2020 UPPER VENOUS STUDY  Patient Name:  TYNLEIGH BIRT  Date of Exam:   07/21/2020 Medical Rec #: [id number]       Accession #:    [id number] Date of Birth: [birth year]       Patient Gender: F Patient Age:   59Y Exam Location:  [facility] Procedure:      VAS [place] UPPER EXTREMITY VENOUS DUPLEX Referring Phys: [id number] Lasana --------------------------------------------------------------------------------  Indications: Edema Comparison Study: no prior Performing Technologist: Abram Sander RVS  Examination Guidelines: A complete evaluation includes B-mode imaging, spectral Doppler, color Doppler, and power Doppler as needed of all accessible portions of each vessel. Bilateral testing is considered an integral part of a complete examination. Limited examinations for reoccurring indications may be performed as noted.  Right Findings: +----------+------------+---------+-----------+----------+-------+ RIGHT     CompressiblePhasicitySpontaneousPropertiesSummary +----------+------------+---------+-----------+----------+-------+ IJV           Full       Yes       Yes                       +----------+------------+---------+-----------+----------+-------+ Subclavian    Full       Yes       Yes                      +----------+------------+---------+-----------+----------+-------+ Axillary      Full       Yes       Yes                      +----------+------------+---------+-----------+----------+-------+  Brachial      Full       Yes       Yes                      +----------+------------+---------+-----------+----------+-------+ Radial        Full                                          +----------+------------+---------+-----------+----------+-------+ Ulnar         Full                                          +----------+------------+---------+-----------+----------+-------+ Cephalic      Full                                          +----------+------------+---------+-----------+----------+-------+ Basilic       Full                                          +----------+------------+---------+-----------+----------+-------+  Left Findings: +----------+------------+---------+-----------+----------+-----------------+ LEFT      CompressiblePhasicitySpontaneousProperties     Summary      +----------+------------+---------+-----------+----------+-----------------+ IJV           Full       Yes       Yes                                +----------+------------+---------+-----------+----------+-----------------+ Subclavian    Full       Yes       Yes                                +----------+------------+---------+-----------+----------+-----------------+ Axillary      Full       Yes       Yes                                +----------+------------+---------+-----------+----------+-----------------+ Brachial      Full       Yes       Yes                                +----------+------------+---------+-----------+----------+-----------------+ Radial        Full                                                     +----------+------------+---------+-----------+----------+-----------------+ Ulnar         Full                                                    +----------+------------+---------+-----------+----------+-----------------+  Cephalic      None                                  Age Indeterminate +----------+------------+---------+-----------+----------+-----------------+ Basilic       Full                                                    +----------+------------+---------+-----------+----------+-----------------+  Summary:  Right: No evidence of deep vein thrombosis in the upper extremity. No evidence of superficial vein thrombosis in the upper extremity.  Left: Findings consistent with age indeterminate superficial vein thrombosis involving the left cephalic vein.  *See table(s) above for measurements and observations.    Preliminary       Scheduled Meds: . sodium chloride   Intravenous Once  . Chlorhexidine Gluconate Cloth  6 each Topical Daily  . dexamethasone (DECADRON) injection  20 mg Intravenous Q24H  . ferrous sulfate  325 mg Oral BID WC  . folic acid  1 mg Oral Daily  . insulin aspart  0-9 Units Subcutaneous TID WC  . mouth rinse  15 mL Mouth Rinse BID  . pantoprazole  40 mg Oral BID  . potassium chloride  40 mEq Oral Daily  . romiPLOStim  1 mcg/kg Subcutaneous Weekly  . sodium chloride flush  10-40 mL Intracatheter Q12H   Continuous Infusions:    LOS: 20 days     Alma Friendly, MD [facility] 07/21/2020, 3:58 PM   Available via Epic secure chat 7am-7pm After these hours, please refer to coverage provider listed on [email or web address]

## 2020-07-22 DIAGNOSIS — D649 Anemia, unspecified: Secondary | ICD-10-CM | POA: Diagnosis not present

## 2020-07-22 DIAGNOSIS — D61818 Other pancytopenia: Secondary | ICD-10-CM | POA: Diagnosis not present

## 2020-07-22 DIAGNOSIS — T827XXD Infection and inflammatory reaction due to other cardiac and vascular devices, implants and grafts, subsequent encounter: Secondary | ICD-10-CM | POA: Diagnosis not present

## 2020-07-22 DIAGNOSIS — N179 Acute kidney failure, unspecified: Secondary | ICD-10-CM | POA: Diagnosis not present

## 2020-07-22 DIAGNOSIS — C3411 Malignant neoplasm of upper lobe, right bronchus or lung: Secondary | ICD-10-CM | POA: Diagnosis not present

## 2020-07-22 DIAGNOSIS — C3491 Malignant neoplasm of unspecified part of right bronchus or lung: Secondary | ICD-10-CM | POA: Diagnosis not present

## 2020-07-22 LAB — PREPARE PLATELET PHERESIS
Unit division: 0
Unit division: 0

## 2020-07-22 LAB — CBC WITH DIFFERENTIAL/PLATELET
Abs Immature Granulocytes: 0.16 10*3/uL — ABNORMAL HIGH (ref 0.00–0.07)
Basophils Absolute: 0 10*3/uL (ref 0.0–0.1)
Basophils Relative: 0 %
Eosinophils Absolute: 0 10*3/uL (ref 0.0–0.5)
Eosinophils Relative: 0 %
HCT: 28.1 % — ABNORMAL LOW (ref 36.0–46.0)
Hemoglobin: 9.3 g/dL — ABNORMAL LOW (ref 12.0–15.0)
Immature Granulocytes: 2 %
Lymphocytes Relative: 12 %
Lymphs Abs: 1 10*3/uL (ref 0.7–4.0)
MCH: 29 pg (ref 26.0–34.0)
MCHC: 33.1 g/dL (ref 30.0–36.0)
MCV: 87.5 fL (ref 80.0–100.0)
Monocytes Absolute: 1.1 10*3/uL — ABNORMAL HIGH (ref 0.1–1.0)
Monocytes Relative: 14 %
Neutro Abs: 5.5 10*3/uL (ref 1.7–7.7)
Neutrophils Relative %: 72 %
Platelets: 54 10*3/uL — ABNORMAL LOW (ref 150–400)
RBC: 3.21 MIL/uL — ABNORMAL LOW (ref 3.87–5.11)
RDW: 14.3 % (ref 11.5–15.5)
WBC: 7.7 10*3/uL (ref 4.0–10.5)
nRBC: 0 % (ref 0.0–0.2)

## 2020-07-22 LAB — BASIC METABOLIC PANEL
Anion gap: 9 (ref 5–15)
BUN: 45 mg/dL — ABNORMAL HIGH (ref 6–20)
CO2: 24 mmol/L (ref 22–32)
Calcium: 7.3 mg/dL — ABNORMAL LOW (ref 8.9–10.3)
Chloride: 106 mmol/L (ref 98–111)
Creatinine, Ser: 1.99 mg/dL — ABNORMAL HIGH (ref 0.44–1.00)
GFR, Estimated: 29 mL/min — ABNORMAL LOW (ref >60)
Glucose, Bld: 116 mg/dL — ABNORMAL HIGH (ref 70–99)
Potassium: 3.5 mmol/L (ref 3.5–5.1)
Sodium: 139 mmol/L (ref 135–145)

## 2020-07-22 LAB — BPAM PLATELET PHERESIS
Blood Product Expiration Date: 202205021254
Blood Product Expiration Date: 202205032359
ISSUE DATE / TIME: 202205011337
ISSUE DATE / TIME: 202205011626
Unit Type and Rh: 5100
Unit Type and Rh: 5100

## 2020-07-22 LAB — GLUCOSE, CAPILLARY
Glucose-Capillary: 81 mg/dL (ref 70–99)
Glucose-Capillary: 94 mg/dL (ref 70–99)
Glucose-Capillary: 150 mg/dL — ABNORMAL HIGH (ref 70–99)

## 2020-07-22 MED ORDER — FUROSEMIDE 40 MG PO TABS
40.0000 mg | ORAL_TABLET | Freq: Every day | ORAL | Status: DC
  Administered 2020-07-22 – 2020-07-25 (×4): 40 mg via ORAL
  Filled 2020-07-22 (×4): qty 1

## 2020-07-22 MED ORDER — DEXAMETHASONE SODIUM PHOSPHATE 10 MG/ML IJ SOLN
10.0000 mg | INTRAMUSCULAR | Status: DC
  Administered 2020-07-22: 10 mg via INTRAVENOUS
  Filled 2020-07-22: qty 1

## 2020-07-22 NOTE — Progress Notes (Signed)
Physical Therapy Treatment Patient Details Name: Brenda [name] MRN: [id number] DOB: 11-Nov-1960 Today's Date: 07/22/2020    History of Present Illness 60 years old female adm with Bacteremia associated with intravascular line, ARF, acute GI bleed, pancytopenia.   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 Julien Nordmann presented to the hospital with epistaxis, dark stools and generalized weakness for few days.  CXR showed Port-A-Cath in place and minimal right pleural effusion but otherwise clear.    PT Comments    Pt initially agreeable to physical therapy however upon sitting EOB, pt reported it was "too much" and she needed to rest today.  Pt states she sat in recliner yesterday for 6 hours and declined being in recliner today.  Pt states she is going home for 3 days and then going to rehab?  Pt reports weakness and edema in LEs limiting her mobility as well.   Continue to recommend SNF if pt willing to participate.   Follow Up Recommendations  SNF     Equipment Recommendations  Rolling walker with 5" wheels;Wheelchair (measurements PT);Wheelchair cushion (measurements PT)    Recommendations for Other Services       Precautions / Restrictions Precautions Precautions: Fall Restrictions Weight Bearing Restrictions: No    Mobility  Bed Mobility Overal bed mobility: Needs Assistance Bed Mobility: Supine to Sit;Sit to Supine     Supine to sit: Min assist Sit to supine: Min assist   General bed mobility comments: pt self assist LEs however also required assist, SpO2 97% on room air upon return to supine    Transfers                 General transfer comment: pt refused; pt briefly sat EOB and then plopped trunk onto bed stating she was unable to continue  Ambulation/Gait                 Stairs             Wheelchair Mobility    Modified Rankin (Stroke Patients  Only)       Balance Overall balance assessment: Needs assistance Sitting-balance support: No upper extremity supported Sitting balance-Leahy Scale: Fair                                      Cognition Arousal/Alertness: Awake/alert Behavior During Therapy: WFL for tasks assessed/performed Overall Cognitive Status: Difficult to assess Area of Impairment: Safety/judgement;Awareness                         Safety/Judgement: Decreased awareness of deficits   Problem Solving: Requires verbal cues;Requires tactile cues;Decreased initiation General Comments: poor insight of deficits, limited participation      Exercises      General Comments        Pertinent Vitals/Pain Pain Assessment: No/denies pain    Home Living                      Prior Function            PT Goals (current goals can now be found in the care plan section) Acute Rehab PT Goals PT Goal Formulation: With patient Time For Goal Achievement: 08/05/20 Potential to Achieve Goals: Fair Progress towards PT goals: Not progressing toward goals - comment    Frequency  Min 2X/week      PT Plan Current plan remains appropriate    Co-evaluation              AM-PAC PT "6 Clicks" Mobility   Outcome Measure  Help needed turning from your back to your side while in a flat bed without using bedrails?: A Little Help needed moving from lying on your back to sitting on the side of a flat bed without using bedrails?: A Little Help needed moving to and from a bed to a chair (including a wheelchair)?: A Lot Help needed standing up from a chair using your arms (e.g., wheelchair or bedside chair)?: A Lot Help needed to walk in hospital room?: A Lot Help needed climbing 3-5 steps with a railing? : Total 6 Click Score: 13    End of Session   Activity Tolerance: Patient limited by fatigue;Other (comment) (pt self limiting) Patient left: in bed;with call bell/phone  within reach;with bed alarm set;with nursing/sitter in room Nurse Communication: Mobility status PT Visit Diagnosis: Muscle weakness (generalized) (M62.81);Difficulty in walking, not elsewhere classified (R26.2)     Time: 5830-9407 PT Time Calculation (min) (ACUTE ONLY): 11 min  Charges:  $Therapeutic Activity: 8-22 mins                     Jannette Spanner PT, DPT Acute Rehabilitation Services Pager: [phone] Office: [phone]  York Ram E 07/22/2020, 12:55 PM

## 2020-07-22 NOTE — Progress Notes (Signed)
HEMATOLOGY-ONCOLOGY PROGRESS NOTE  SUBJECTIVE: The patient denies bleeding this morning and over the weekend.  She states that she is feeling better overall.  Received Nplate on 2/40.  Remains of dexamethasone 20 mg IV daily.  Received another unit of platelets on 5/1.  Oncology History  Malignant neoplasm of right lung ([place])  09/14/2019 Initial Diagnosis   Adenocarcinoma of right lung, stage 4 ([place])   09/28/2019 -  Chemotherapy    Patient is on Treatment Plan: LUNG CARBOPLATIN / PEMETREXED / PEMBROLIZUMAB Q21D INDUCTION X 4 CYCLES / MAINTENANCE PEMETREXED + PEMBROLIZUMAB      04/17/2020 Cancer Staging   Staging form: Lung, AJCC 8th Edition - Clinical: Stage IVA (cT2a, cN2, cM1b) - Signed by Curt Bears, MD on 04/17/2020      REVIEW OF SYSTEMS:   Constitutional: Denies fevers, chills Eyes: Denies blurriness of vision Ears, nose, mouth, throat, and face: Denies mucositis or sore throat Respiratory: Denies cough, dyspnea or wheezes Cardiovascular: Denies palpitation, chest discomfort Gastrointestinal: Denies GI bleeding this morning Skin: Denies abnormal skin rashes Lymphatics: Denies new lymphadenopathy or easy bruising Neurological:Denies numbness, tingling or new weaknesses Behavioral/Psych: Mood is stable, no new changes  Extremities: No lower extremity edema All other systems were reviewed with the patient and are negative.  I have reviewed the past medical history, past surgical history, social history and family history with the patient and they are unchanged from previous note.   PHYSICAL EXAMINATION: ECOG PERFORMANCE STATUS: 2 - Symptomatic, <50% confined to bed  Vitals:   07/21/20 2038 07/22/20 0626  BP: (!) 159/95 (!) 145/88  Pulse: 84 78  Resp: 19 18  Temp: 98.1 F (36.7 C) 98 F (36.7 C)  SpO2: 99% 99%   Filed Weights   07/18/20 0645 07/20/20 0411 07/22/20 0333  Weight: 72.6 kg 73.5 kg 73.2 kg    Intake/Output from previous day: 05/01 0701 - 05/02  0700 In: 4232.5 [P.O.:2400; I.V.:40; Blood:1792.5] Out: 4700 [Urine:4700]  GENERAL:alert, no distress and comfortable SKIN: skin color, texture, turgor are normal, no rashes or significant lesions EYES: normal, Conjunctiva are pink and non-injected, sclera clear OROPHARYNX:no exudate, no erythema and lips, buccal mucosa, and tongue normal  LUNGS: clear to auscultation and percussion with normal breathing effort HEART: regular rate & rhythm and no murmurs and no lower extremity edema ABDOMEN:abdomen soft, non-tender and normal bowel sounds NEURO: alert & oriented x 3 with fluent speech, no focal motor/sensory deficits  LABORATORY DATA:  I have reviewed the data as listed CMP Latest Ref Rng & Units 07/22/2020 07/21/2020 07/20/2020  Glucose 70 - 99 mg/dL 116(H) 138(H) 110(H)  BUN 6 - 20 mg/dL 45(H) 48(H) 56(H)  Creatinine 0.44 - 1.00 mg/dL 1.99(H) 2.17(H) 1.98(H)  Sodium 135 - 145 mmol/L 139 143 143  Potassium 3.5 - 5.1 mmol/L 3.5 3.1(L) 3.7  Chloride 98 - 111 mmol/L 106 109 113(H)  CO2 22 - 32 mmol/L 24 25 22   Calcium 8.9 - 10.3 mg/dL 7.3(L) 7.7(L) 7.5(L)  Total Protein 6.5 - 8.1 g/dL - - -  Total Bilirubin 0.3 - 1.2 mg/dL - - -  Alkaline Phos 38 - 126 U/L - - -  AST 15 - 41 U/L - - -  ALT 0 - 44 U/L - - -    Lab Results  Component Value Date   WBC 7.7 07/22/2020   HGB 9.3 (L) 07/22/2020   HCT 28.1 (L) 07/22/2020   MCV 87.5 07/22/2020   PLT 54 (L) 07/22/2020   NEUTROABS 5.5 07/22/2020  CT Head Wo Contrast  Result Date: 07/01/2020 CLINICAL DATA:  Recent seizure activity EXAM: CT HEAD WITHOUT CONTRAST TECHNIQUE: Contiguous axial images were obtained from the base of the skull through the vertex without intravenous contrast. COMPARISON:  04/07/2020 FINDINGS: Brain: Mild atrophic changes are noted. No findings to suggest acute hemorrhage, acute infarction or space-occupying mass lesion noted. Vascular: No hyperdense vessel or unexpected calcification. Skull: Normal. Negative for  fracture or focal lesion. Sinuses/Orbits: No acute finding. Chronic soft tissue changes are noted in the left ethmoid sinuses. Other: None. IMPRESSION: Mild atrophic changes without acute abnormality. Electronically Signed   By: Inez Catalina M.D.   On: 07/01/2020 15:05   NM GI Blood Loss  Result Date: 07/02/2020 CLINICAL DATA:  Acute lower GI bleeding, 3 episodes of large volume hematochezia. History of lung cancer EXAM: NUCLEAR MEDICINE GASTROINTESTINAL BLEEDING SCAN TECHNIQUE: Sequential abdominal images were obtained following intravenous administration of Tc-16mlabeled red blood cells. RADIOPHARMACEUTICALS:  22.7 mCi Tc-934mertechnetate in-vitro labeled red cells. COMPARISON:  05/21/2020 CT with contrast FINDINGS: Abnormal activity within the mid and lower abdomen extending into the upper pelvis and peristalsing throughout loops of bowel compatible with mild acute GI bleeding. Based on the pattern of peristalsis, it is difficult to determine location but small bowel is favored over colon. IMPRESSION: Positive exam for mild acute GI bleeding as above. These results will be called to the ordering clinician or representative by the Radiologist Assistant, and communication documented in the PACS or ClFrontier Oil CorporationElectronically Signed   By: M.Jerilynn Mages Shick M.D.   On: 07/02/2020 12:08   [place]  Result Date: 07/01/2020 CLINICAL DATA:  Acute kidney injury. EXAM: RENAL / URINARY TRACT ULTRASOUND COMPLETE COMPARISON:  CT abdomen pelvis dated May 21, 2020. FINDINGS: Right Kidney: Renal measurements: 10.0 x 4.2 x 5.0 cm = volume: 109 mL. Echogenicity within normal limits. No mass or hydronephrosis visualized. Left Kidney: Renal measurements: 9.1 x 5.1 x 5.3 cm = volume: 128 mL. Echogenicity within normal limits. No mass or hydronephrosis visualized. Other: Bilateral pleural effusions. IMPRESSION: 1. Normal renal ultrasound. 2. Bilateral pleural effusions. Electronically Signed   By: WiTitus Dubin.D.   On:  07/01/2020 15:55   DG CHEST PORT 1 VIEW  Result Date: 07/07/2020 CLINICAL DATA:  History of lung carcinoma with weakness EXAM: PORTABLE CHEST 1 VIEW COMPARISON:  07/01/2020 FINDINGS: Cardiac shadow is stable. Right-sided chest wall port is again noted and stable. New bilateral pleural effusions are noted left greater than right with likely underlying atelectasis. No acute bony abnormality is seen. IMPRESSION: New bilateral pleural effusions left greater than right. Electronically Signed   By: MaInez Catalina.D.   On: 07/07/2020 20:13   DG Chest Port 1 View  Result Date: 07/01/2020 CLINICAL DATA:  Shortness of breath EXAM: PORTABLE CHEST 1 VIEW COMPARISON:  April 04, 2019 FINDINGS: Port-A-Cath tip is in the superior vena cava. No pneumothorax. There is a rather minimal right pleural effusion. No edema or airspace opacity. Heart size and pulmonary vascularity are normal. No adenopathy. No bone lesions. IMPRESSION: Port-A-Cath as noted. Other minimal right pleural effusion. Lungs otherwise clear. Cardiac silhouette normal. Electronically Signed   By: WiLowella GripII M.D.   On: 07/01/2020 11:44   VAS [place]ER EXTREMITY VENOUS DUPLEX  Result Date: 07/21/2020 UPPER VENOUS STUDY  Patient Name:  MAZAREENA WILLISDate of Exam:   07/21/2020 Medical Rec #: [id number]     Accession #:    [id number] of  Birth: [birth year]       Patient Gender: F Patient Age:   78Y Exam Location:  [facility] Procedure:      VAS [place] UPPER EXTREMITY VENOUS DUPLEX Referring Phys: [id number] [facility] J EZENDUKA --------------------------------------------------------------------------------  Indications: Edema Comparison Study: no prior Performing Technologist: Abram Sander RVS  Examination Guidelines: A complete evaluation includes B-mode imaging, spectral Doppler, color Doppler, and power Doppler as needed of all accessible portions of each vessel. Bilateral testing is considered an integral part of a complete examination.  Limited examinations for reoccurring indications may be performed as noted.  Right Findings: +----------+------------+---------+-----------+----------+-------+ RIGHT     CompressiblePhasicitySpontaneousPropertiesSummary +----------+------------+---------+-----------+----------+-------+ IJV           Full       Yes       Yes                      +----------+------------+---------+-----------+----------+-------+ Subclavian    Full       Yes       Yes                      +----------+------------+---------+-----------+----------+-------+ Axillary      Full       Yes       Yes                      +----------+------------+---------+-----------+----------+-------+ Brachial      Full       Yes       Yes                      +----------+------------+---------+-----------+----------+-------+ Radial        Full                                          +----------+------------+---------+-----------+----------+-------+ Ulnar         Full                                          +----------+------------+---------+-----------+----------+-------+ Cephalic      Full                                          +----------+------------+---------+-----------+----------+-------+ Basilic       Full                                          +----------+------------+---------+-----------+----------+-------+  Left Findings: +----------+------------+---------+-----------+----------+-----------------+ LEFT      CompressiblePhasicitySpontaneousProperties     Summary      +----------+------------+---------+-----------+----------+-----------------+ IJV           Full       Yes       Yes                                +----------+------------+---------+-----------+----------+-----------------+ Subclavian    Full       Yes       Yes                                +----------+------------+---------+-----------+----------+-----------------+  Axillary      Full        Yes       Yes                                +----------+------------+---------+-----------+----------+-----------------+ Brachial      Full       Yes       Yes                                +----------+------------+---------+-----------+----------+-----------------+ Radial        Full                                                    +----------+------------+---------+-----------+----------+-----------------+ Ulnar         Full                                                    +----------+------------+---------+-----------+----------+-----------------+ Cephalic      None                                  Age Indeterminate +----------+------------+---------+-----------+----------+-----------------+ Basilic       Full                                                    +----------+------------+---------+-----------+----------+-----------------+  Summary:  Right: No evidence of deep vein thrombosis in the upper extremity. No evidence of superficial vein thrombosis in the upper extremity.  Left: Findings consistent with age indeterminate superficial vein thrombosis involving the left cephalic vein.  *See table(s) above for measurements and observations.  Diagnosing physician: Harold Barban MD Electronically signed by Harold Barban MD on 07/21/2020 at 4:26:43 PM.    Final     ASSESSMENT AND PLAN: This is a 60 year old African-American female with stage IV (T2a, N2, M1b) non-small cell lung cancer, adenocarcinoma presented with a large right upper lobe lung mass in addition to suspicious right paratracheal lymphadenopathy and bilateral pulmonary nodules as well as retroperitoneal lymphadenopathy diagnosed in May 2021.  She has no actionable mutations and is negative for PD-L1.  The patient started palliative systemic chemotherapy with carboplatin for an AUC of 5, Alimta 500 mg/m, and Keytruda 200 mg IV every 3 weeks.  Starting from cycle #5, she has been on maintenance Alimta  and Keytruda.  Her Alimta dose has been dose reduced secondary to neutropenia.  She has also required intermittent holding of Alimta due to renal insufficiency.  She last received her chemotherapy on 06/18/2020.  The patient is now admitted GI bleed, pancytopenia, AKI, seizure-like activity, and probable sepsis with ?staph bacteremia.  Bleeding scan showed mild acute GI bleeding thought to be small bowel in origin.   The patient received Granix with improvement of her white blood cell count.  Hemoglobin is stable at this time.  She is not bleeding anymore.  The patient has  received multiple platelet transfusions, IV dexamethasone, and a dose of Nplate.  Platelet count has improved to 54,000 today.  I will decrease her dexamethasone to 10 mg IV today.  Our standpoint, she may be discharged home if otherwise medically stable.  I would recommend giving her dexamethasone 4 mg p.o. daily x3 days and then 2 mg p.o. daily x3 days and then stop.  She was advised to take oral dexamethasone with food.  We will plan to see the patient back in the office after discharge to discuss further treatment.  We will consider discontinuing Alimta and continuing maintenance immunotherapy with Keytruda only.  She already has outpatient follow-up scheduled on 07/23/2020.  If she does not get discharged today, I will adjust her appointment.   LOS: 21 days   Mikey Bussing, DNP, AGPCNP-BC, AOCNP 07/22/20

## 2020-07-22 NOTE — Plan of Care (Signed)
  Problem: Clinical Measurements: Goal: Diagnostic test results will improve Outcome: Progressing   

## 2020-07-22 NOTE — Progress Notes (Signed)
PROGRESS NOTE    Brenda [name]  CVU:[id number] DOB: 1960/06/11 DOA: 07/01/2020 PCP: Pcp, No     Brief Narrative:  HPI from Dr Loma Sender is a 60 years old female with past medical history of stage IV non-small cell lung cancer- adenocarcinoma of right lung, undergoing chemotherapy with Dr. Julien Nordmann, who presented tothehospital with epistaxis,dark stools and generalized weakness for few days. In the ED, lactic acid was >11, with INR 1.4 andhad AKI withcreatinine over 5 without hyperkalemia and metabolic acidosis. Patient received PRBC in the ED and was more somnolent and had focal seizure-like activity. She was given Ativan and Keppra andhead CT was done. Bear hugger was initiated and the patient was admitted to the hospital. Hemoglobin was around 2.4 subsequently and was given packed RBC. Of note patient was initiated on chemotherapy on 09/28/2019 for 11 cycles and current regimen of with carboplatin for an AUC of 5, Alimta 500 mg/m2, and Keytruda 200 mg IV every 3 weeks. Alimtawasheld due to renal insufficiency and neutropenia. During hospitalization, patient was seen by GI and oncology. Patient has been having pancytopenia with initial bleeding and conservative management has been pursued at this time with blood and platelet transfusion daily for the past 2 weeks.   New events last 24 hours / Subjective: Today, patient denies any new complaints, very deconditioned, still edematous especially in the upper extremities abdomen and thighs.  Discussed extensively with patient for SNF placement, patient in agreement.    Assessment & Plan:   Principal Problem:   Bacteremia associated with intravascular line ([place]) Active Problems:   Malignant neoplasm of right lung (HCC)   Hypokalemia   Acute on chronic anemia   Gastrointestinal hemorrhage   Pancytopenia (HCC)   Renal failure   AKI (acute kidney injury) ([place])   Acute blood loss anemia   Pancytopenia Improving Likely  secondary to chemotherapy toxicity Status post packed RBC and platelet transfusion on a daily basis for the past 2 weeks Pt received Granix as per oncology which has been completed- ANC improved Platelets with no significant improvement, despite daily transfusion, 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 Daily CBC  AKI Ongoing Likely secondary tochemotherapy and severe anemia with possible volume depletion. Received several liters of IV fluidduring hospitalization. Renal ultrasound was within normal limits. Baseline creatinine 1-1.4 Daily BMP  Acute GI bleed Patient had RBC scan showing mild acute GI bleeding in the small bowel. Unlikely to yield muchfrom angiogram/embolization as per GI. Had a BM with blood in it on 07/18/20, likely mucosal bleeding GIrecommended platelet and RBC transfusionas above.  Continue Protonix   Anasarca Possibly third spacing, multiple transfusions Edematous around BUE, abdomen and bilateral thigh Prealbumin 22.1 Lasix daily  RUE extremity phlebitis Indeterminant superficial vein thrombosis involving the left cephalic vein BUE Doppler showed above We will continue to monitor  Stage IV lung cancer Adenocarcinomaby biopsy. Status post 2 cycles of pemetrexed and pembrolizumabm. Oncology on board  Seizure Isolated episode Unclear of etiology, possibility of hypoxia  Coagulase-negative staph bacteremia Only 1 set of blood culture was obtained. Both aerobic and anaerobic bottles were positive. Received vancomycin. Per ID continue cefazolin through 4/25, completed  Hypertension Continue Norvasc  GOC discussion Patient with overall poor prognosis, current stage IV lung adenocarcinoma with significant persistent pancytopenia Patient still wants to be full code Palliative  care consulted, appreciate  recs    DVT prophylaxis:  SCDs Start: 07/01/20 1431  Code Status: Full code Family Communication: No family at bedside Disposition Plan:  Status is: Inpatient  Remains inpatient appropriate because:IV treatments appropriate due to intensity of illness or inability to take PO and Inpatient level of care appropriate due to severity of illness   Dispo: The patient is from: Home              Anticipated d/c is to: SNF              Patient currently is medically stable to d/c.               Difficult to place patient No   Antimicrobials:  Anti-infectives (From admission, onward)   Start     Dose/Rate Route Frequency Ordered Stop   07/08/20 1000  ceFAZolin (ANCEF) IVPB 2g/100 mL premix        2 g 200 mL/hr over 30 Minutes Intravenous Every 8 hours 07/08/20 0744 07/14/20 2359   07/06/20 2200  ceFAZolin (ANCEF) IVPB 1 g/50 mL premix  Status:  Discontinued        1 g 100 mL/hr over 30 Minutes Intravenous Every 12 hours 07/06/20 1058 07/08/20 0744   07/05/20 0600  vancomycin (VANCOREADY) IVPB 750 mg/150 mL        750 mg 150 mL/hr over 60 Minutes Intravenous  Once 07/04/20 1351 07/05/20 0712   07/03/20 1030  vancomycin (VANCOREADY) IVPB 750 mg/150 mL        750 mg 150 mL/hr over 60 Minutes Intravenous  Once 07/03/20 0942 07/03/20 1155   07/01/20 1145  ceFEPIme (MAXIPIME) 2 g in sodium chloride 0.9 % 100 mL IVPB        2 g 200 mL/hr over 30 Minutes Intravenous  Once 07/01/20 1140 07/01/20 1243   07/01/20 1145  vancomycin (VANCOCIN) IVPB 1000 mg/200 mL premix        1,000 mg 200 mL/hr over 60 Minutes Intravenous  Once 07/01/20 1140 07/01/20 1359       Objective: Vitals:   07/21/20 2038 07/22/20 0333 07/22/20 0626 07/22/20 1528  BP: (!) 159/95  (!) 145/88 (!) 148/88  Pulse: 84  78 91  Resp: 19  18 20   Temp: 98.1 F (36.7 C)  98 F (36.7 C) 97.9 F (36.6 C)  TempSrc: Oral  Oral Oral  SpO2: 99%  99% 97%  Weight:  73.2 kg    Height:         Intake/Output Summary (Last 24 hours) at 07/22/2020 1638 Last data filed at 07/22/2020 0300 Gross per 24 hour  Intake 1542.5 ml  Output 1950 ml  Net -407.5 ml   Filed Weights   07/18/20 0645 07/20/20 0411 07/22/20 0333  Weight: 72.6 kg 73.5 kg 73.2 kg    Examination:  General: NAD, deconditioned, edematous (BUE, abdomen, b/l thigh)  Cardiovascular: S1, S2 present  Respiratory: CTAB  Abdomen: Soft, nontender, nondistended, bowel sounds present  Musculoskeletal: Trace bilateral pedal edema noted  Skin: Normal  Psychiatry: Normal mood    Data Reviewed: I have personally reviewed following labs and imaging studies  CBC: Recent Labs  Lab 07/18/20 1630 07/19/20 0100 07/19/20 0902 07/20/20 0357 07/21/20 0900 07/22/20 0337  WBC 6.6  --  7.3 6.2 7.4 7.7  NEUTROABS 4.2  --  4.8 4.2 5.1 5.5  HGB 6.8* 8.4* 9.1* 7.7* 11.0* 9.3*  HCT 20.7* 24.3* 26.8* 23.0* 33.3* 28.1*  MCV 87.7  --  84.5 85.2 86.7  87.5  PLT 27*  --  8* 7* 9* 54*   Basic Metabolic Panel: Recent Labs  Lab 07/16/20 0444 07/18/20 0339 07/19/20 0902 07/20/20 0357 07/21/20 0900 07/22/20 0337  NA 142 140 142 143 143 139  K 4.1 3.9 3.8 3.7 3.1* 3.5  CL 111 111 112* 113* 109 106  CO2 24 23 22 22 25 24   GLUCOSE 183* 111* 101* 110* 138* 116*  BUN 12 36* 54* 56* 48* 45*  CREATININE 2.09* 2.19* 2.15* 1.98* 2.17* 1.99*  CALCIUM 7.8* 7.4* 7.6* 7.5* 7.7* 7.3*  MG 2.0  --   --   --   --   --    GFR: Estimated Creatinine Clearance: 32 mL/min (A) (by C-G formula based on SCr of 1.99 mg/dL (H)). Liver Function Tests: No results for input(s): AST, ALT, ALKPHOS, BILITOT, PROT, ALBUMIN in the last 168 hours. No results for input(s): LIPASE, AMYLASE in the last 168 hours. No results for input(s): AMMONIA in the last 168 hours. Coagulation Profile: No results for input(s): INR, PROTIME in the last 168 hours. Cardiac Enzymes: No results for input(s): CKTOTAL, CKMB, CKMBINDEX, TROPONINI in the last 168  hours. BNP (last 3 results) No results for input(s): PROBNP in the last 8760 hours. HbA1C: No results for input(s): HGBA1C in the last 72 hours. CBG: Recent Labs  Lab 07/21/20 0822 07/21/20 1149 07/21/20 1659 07/22/20 0800 07/22/20 1206  GLUCAP 89 112* 100* 81 94   Lipid Profile: No results for input(s): CHOL, HDL, LDLCALC, TRIG, CHOLHDL, LDLDIRECT in the last 72 hours. Thyroid Function Tests: No results for input(s): TSH, T4TOTAL, FREET4, T3FREE, THYROIDAB in the last 72 hours. Anemia Panel: No results for input(s): VITAMINB12, FOLATE, FERRITIN, TIBC, IRON, RETICCTPCT in the last 72 hours. Sepsis Labs: No results for input(s): PROCALCITON, LATICACIDVEN in the last 168 hours.  No results found for this or any previous visit (from the past 240 hour(s)).    Radiology Studies: VAS [place] UPPER EXTREMITY VENOUS DUPLEX  Result Date: 07/21/2020 UPPER VENOUS STUDY  Patient Name:  BRIEL GALLICCHIO  Date of Exam:   07/21/2020 Medical Rec #: [id number]       Accession #:    [id number] Date of Birth: 09-24-1960       Patient Gender: F Patient Age:   59Y Exam Location:  [facility] Procedure:      VAS [place] UPPER EXTREMITY VENOUS DUPLEX Referring Phys: [id number] [place] --------------------------------------------------------------------------------  Indications: Edema Comparison Study: no prior Performing Technologist: Abram Sander RVS  Examination Guidelines: A complete evaluation includes B-mode imaging, spectral Doppler, color Doppler, and power Doppler as needed of all accessible portions of each vessel. Bilateral testing is considered an integral part of a complete examination. Limited examinations for reoccurring indications may be performed as noted.  Right Findings: +----------+------------+---------+-----------+----------+-------+ RIGHT     CompressiblePhasicitySpontaneousPropertiesSummary +----------+------------+---------+-----------+----------+-------+ IJV           Full        Yes       Yes                      +----------+------------+---------+-----------+----------+-------+ Subclavian    Full       Yes       Yes                      +----------+------------+---------+-----------+----------+-------+ Axillary      Full       Yes       Yes                      +----------+------------+---------+-----------+----------+-------+  Brachial      Full       Yes       Yes                      +----------+------------+---------+-----------+----------+-------+ Radial        Full                                          +----------+------------+---------+-----------+----------+-------+ Ulnar         Full                                          +----------+------------+---------+-----------+----------+-------+ Cephalic      Full                                          +----------+------------+---------+-----------+----------+-------+ Basilic       Full                                          +----------+------------+---------+-----------+----------+-------+  Left Findings: +----------+------------+---------+-----------+----------+-----------------+ LEFT      CompressiblePhasicitySpontaneousProperties     Summary      +----------+------------+---------+-----------+----------+-----------------+ IJV           Full       Yes       Yes                                +----------+------------+---------+-----------+----------+-----------------+ Subclavian    Full       Yes       Yes                                +----------+------------+---------+-----------+----------+-----------------+ Axillary      Full       Yes       Yes                                +----------+------------+---------+-----------+----------+-----------------+ Brachial      Full       Yes       Yes                                +----------+------------+---------+-----------+----------+-----------------+ Radial        Full                                                     +----------+------------+---------+-----------+----------+-----------------+ Ulnar         Full                                                    +----------+------------+---------+-----------+----------+-----------------+  Cephalic      None                                  Age Indeterminate +----------+------------+---------+-----------+----------+-----------------+ Basilic       Full                                                    +----------+------------+---------+-----------+----------+-----------------+  Summary:  Right: No evidence of deep vein thrombosis in the upper extremity. No evidence of superficial vein thrombosis in the upper extremity.  Left: Findings consistent with age indeterminate superficial vein thrombosis involving the left cephalic vein.  *See table(s) above for measurements and observations.  Diagnosing physician: Harold Barban MD Electronically signed by Harold Barban MD on 07/21/2020 at 4:26:43 PM.    Final       Scheduled Meds: . sodium chloride   Intravenous Once  . Chlorhexidine Gluconate Cloth  6 each Topical Daily  . dexamethasone (DECADRON) injection  10 mg Intravenous Q24H  . ferrous sulfate  325 mg Oral BID WC  . folic acid  1 mg Oral Daily  . furosemide  40 mg Oral Daily  . insulin aspart  0-9 Units Subcutaneous TID WC  . mouth rinse  15 mL Mouth Rinse BID  . pantoprazole  40 mg Oral BID  . potassium chloride  40 mEq Oral Daily  . romiPLOStim  1 mcg/kg Subcutaneous Weekly  . sodium chloride flush  10-40 mL Intracatheter Q12H   Continuous Infusions:    LOS: 21 days     Alma Friendly, MD [facility] 07/22/2020, 4:38 PM   Available via Epic secure chat 7am-7pm After these hours, please refer to coverage provider listed on [email or web address]

## 2020-07-23 ENCOUNTER — Inpatient Hospital Stay: Payer: Medicaid Other

## 2020-07-23 ENCOUNTER — Inpatient Hospital Stay: Payer: Medicaid Other | Attending: Internal Medicine

## 2020-07-23 ENCOUNTER — Inpatient Hospital Stay: Payer: Medicaid Other | Admitting: Internal Medicine

## 2020-07-23 DIAGNOSIS — D649 Anemia, unspecified: Secondary | ICD-10-CM | POA: Diagnosis not present

## 2020-07-23 DIAGNOSIS — D61818 Other pancytopenia: Secondary | ICD-10-CM | POA: Diagnosis not present

## 2020-07-23 DIAGNOSIS — T827XXD Infection and inflammatory reaction due to other cardiac and vascular devices, implants and grafts, subsequent encounter: Secondary | ICD-10-CM | POA: Diagnosis not present

## 2020-07-23 DIAGNOSIS — C3491 Malignant neoplasm of unspecified part of right bronchus or lung: Secondary | ICD-10-CM | POA: Diagnosis not present

## 2020-07-23 DIAGNOSIS — C3411 Malignant neoplasm of upper lobe, right bronchus or lung: Secondary | ICD-10-CM | POA: Diagnosis not present

## 2020-07-23 DIAGNOSIS — N179 Acute kidney failure, unspecified: Secondary | ICD-10-CM | POA: Diagnosis not present

## 2020-07-23 LAB — BASIC METABOLIC PANEL WITH GFR
Anion gap: 11 (ref 5–15)
BUN: 41 mg/dL — ABNORMAL HIGH (ref 6–20)
CO2: 24 mmol/L (ref 22–32)
Calcium: 7.5 mg/dL — ABNORMAL LOW (ref 8.9–10.3)
Chloride: 110 mmol/L (ref 98–111)
Creatinine, Ser: 1.77 mg/dL — ABNORMAL HIGH (ref 0.44–1.00)
GFR, Estimated: 33 mL/min — ABNORMAL LOW
Glucose, Bld: 106 mg/dL — ABNORMAL HIGH (ref 70–99)
Potassium: 3.4 mmol/L — ABNORMAL LOW (ref 3.5–5.1)
Sodium: 145 mmol/L (ref 135–145)

## 2020-07-23 LAB — CBC WITH DIFFERENTIAL/PLATELET
Abs Immature Granulocytes: 0.29 10*3/uL — ABNORMAL HIGH (ref 0.00–0.07)
Basophils Absolute: 0 10*3/uL (ref 0.0–0.1)
Basophils Relative: 0 %
Eosinophils Absolute: 0 10*3/uL (ref 0.0–0.5)
Eosinophils Relative: 0 %
HCT: 30 % — ABNORMAL LOW (ref 36.0–46.0)
Hemoglobin: 9.9 g/dL — ABNORMAL LOW (ref 12.0–15.0)
Immature Granulocytes: 3 %
Lymphocytes Relative: 16 %
Lymphs Abs: 1.4 10*3/uL (ref 0.7–4.0)
MCH: 29.1 pg (ref 26.0–34.0)
MCHC: 33 g/dL (ref 30.0–36.0)
MCV: 88.2 fL (ref 80.0–100.0)
Monocytes Absolute: 1.2 10*3/uL — ABNORMAL HIGH (ref 0.1–1.0)
Monocytes Relative: 14 %
Neutro Abs: 6 10*3/uL (ref 1.7–7.7)
Neutrophils Relative %: 67 %
Platelets: 34 10*3/uL — ABNORMAL LOW (ref 150–400)
RBC: 3.4 MIL/uL — ABNORMAL LOW (ref 3.87–5.11)
RDW: 14.6 % (ref 11.5–15.5)
WBC: 9 10*3/uL (ref 4.0–10.5)
nRBC: 0 % (ref 0.0–0.2)

## 2020-07-23 LAB — GLUCOSE, CAPILLARY
Glucose-Capillary: 80 mg/dL (ref 70–99)
Glucose-Capillary: 126 mg/dL — ABNORMAL HIGH (ref 70–99)
Glucose-Capillary: 102 mg/dL — ABNORMAL HIGH (ref 70–99)

## 2020-07-23 MED ORDER — DEXAMETHASONE 2 MG PO TABS
2.0000 mg | ORAL_TABLET | Freq: Every day | ORAL | Status: AC
Start: 2020-07-26 — End: 2020-07-28
  Administered 2020-07-26 – 2020-07-28 (×3): 2 mg via ORAL
  Filled 2020-07-23 (×3): qty 1

## 2020-07-23 MED ORDER — DEXAMETHASONE 4 MG PO TABS
4.0000 mg | ORAL_TABLET | Freq: Every day | ORAL | Status: AC
Start: 2020-07-23 — End: 2020-07-25
  Administered 2020-07-23 – 2020-07-25 (×3): 4 mg via ORAL
  Filled 2020-07-23 (×3): qty 1

## 2020-07-23 NOTE — Progress Notes (Signed)
PROGRESS NOTE    Brenda [name]  XLK:[id number] DOB: 04-14-1960 DOA: 07/01/2020 PCP: Pcp, No     Brief Narrative:  HPI from Dr Loma Sender is a 60 years old female with past medical history of stage IV non-small cell lung cancer- adenocarcinoma of right lung, undergoing chemotherapy with Dr. Julien Nordmann, who presented tothehospital with epistaxis,dark stools and generalized weakness for few days. In the ED, lactic acid was >11, with INR 1.4 andhad AKI withcreatinine over 5 without hyperkalemia and metabolic acidosis. Patient received PRBC in the ED and was more somnolent and had focal seizure-like activity. She was given Ativan and Keppra andhead CT was done. Bear hugger was initiated and the patient was admitted to the hospital. Hemoglobin was around 2.4 subsequently and was given packed RBC. Of note patient was initiated on chemotherapy on 09/28/2019 for 11 cycles and current regimen of with carboplatin for an AUC of 5, Alimta 500 mg/m2, and Keytruda 200 mg IV every 3 weeks. Alimtawasheld due to renal insufficiency and neutropenia. During hospitalization, patient was seen by GI and oncology. Patient has been having pancytopenia with initial bleeding and conservative management has been pursued at this time with blood and platelet transfusion daily for the past 2 weeks.   New events last 24 hours / Subjective: Today, patient denies any new complaints, patient's edema improving overall.  Patient denies any bleeding, abdominal pain, nausea/vomiting, fever/chills.    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 daily x3 days and then 2 mg p.o. daily x3 days and then stop Close outpatient follow-up with oncology Daily CBC  AKI Improving Likely secondary tochemotherapy and severe anemia with possible volume depletion. Received several liters of IV fluidduring hospitalization. Renal ultrasound was within normal limits. Baseline creatinine 1-1.4 Daily BMP  Acute GI bleed Patient had RBC scan showing mild acute GI bleeding in the small bowel. Unlikely to yield muchfrom angiogram/embolization as per GI. Had a BM with blood in it on 07/18/20, likely mucosal bleeding GIrecommended platelet and RBC transfusionas above.  Continue Protonix   Anasarca Improving Possibly third spacing, multiple transfusions Edematous around BUE, abdomen and bilateral thigh Prealbumin 22.1 Lasix daily  RUE extremity phlebitis Indeterminant superficial vein thrombosis involving the left cephalic vein BUE Doppler showed above We will continue to monitor  Stage IV lung cancer Adenocarcinomaby biopsy. Status post 2 cycles of pemetrexed and pembrolizumabm. Oncology on board  Seizure Isolated episode Unclear of etiology, possibility of hypoxia  Coagulase-negative staph bacteremia Only 1 set of blood culture was obtained. Both aerobic and anaerobic bottles were positive. Received vancomycin. Per ID continue cefazolin through 4/25, completed  Hypertension Continue Norvasc  GOC discussion Patient with overall  poor prognosis, current stage IV  lung adenocarcinoma Patient still wants to be full code Palliative care consulted, appreciate recs    DVT prophylaxis:  SCDs Start: 07/01/20 1431  Code Status: Full code Family Communication: No family at bedside Disposition Plan:  Status is: Inpatient  Remains inpatient appropriate because:IV treatments appropriate due to intensity of illness or inability to take PO and Inpatient level of care appropriate due to severity of illness   Dispo: The patient is from: Home              Anticipated d/c is to: SNF              Patient currently is medically stable to d/c.               Difficult to place patient No   Antimicrobials:  Anti-infectives (From admission, onward)   Start     Dose/Rate Route Frequency Ordered Stop   07/08/20 1000  ceFAZolin (ANCEF) IVPB 2g/100 mL premix        2 g 200 mL/hr over 30 Minutes Intravenous Every 8 hours 07/08/20 0744 07/14/20 2359   07/06/20 2200  ceFAZolin (ANCEF) IVPB 1 g/50 mL premix  Status:  Discontinued        1 g 100 mL/hr over 30 Minutes Intravenous Every 12 hours 07/06/20 1058 07/08/20 0744   07/05/20 0600  vancomycin (VANCOREADY) IVPB 750 mg/150 mL        750 mg 150 mL/hr over 60 Minutes Intravenous  Once 07/04/20 1351 07/05/20 0712   07/03/20 1030  vancomycin (VANCOREADY) IVPB 750 mg/150 mL        750 mg 150 mL/hr over 60 Minutes Intravenous  Once 07/03/20 0942 07/03/20 1155   07/01/20 1145  ceFEPIme (MAXIPIME) 2 g in sodium chloride 0.9 % 100 mL IVPB        2 g 200 mL/hr over 30 Minutes Intravenous  Once 07/01/20 1140 07/01/20 1243   07/01/20 1145  vancomycin (VANCOCIN) IVPB 1000 mg/200 mL premix        1,000 mg 200 mL/hr over 60 Minutes Intravenous  Once 07/01/20 1140 07/01/20 1359       Objective: Vitals:   07/23/20 0438 07/23/20 0500 07/23/20 0548 07/23/20 1130  BP: (!) 166/91  (!) 159/89 135/80  Pulse: 100  86 100  Resp: 18   19  Temp: (!) 97.4 F (36.3 C)   97.9 F (36.6 C)  TempSrc: Oral   Oral  SpO2: 98%   98%   Weight:  75.5 kg    Height:        Intake/Output Summary (Last 24 hours) at 07/23/2020 1319 Last data filed at 07/23/2020 1155 Gross per 24 hour  Intake 477 ml  Output 1800 ml  Net -1323 ml   Filed Weights   07/20/20 0411 07/22/20 0333 07/23/20 0500  Weight: 73.5 kg 73.2 kg 75.5 kg    Examination:  General: NAD, deconditioned, edematous (BUE, abdomen, b/l thigh)  Cardiovascular: S1, S2 present  Respiratory: CTAB  Abdomen: Soft, nontender, nondistended, bowel sounds present  Musculoskeletal: Trace bilateral pedal edema noted  Skin: Normal  Psychiatry: Normal mood    Data Reviewed: I have personally reviewed following labs and imaging studies  CBC: Recent Labs  Lab 07/19/20 0902 07/20/20 0357 07/21/20 0900 07/22/20 0337 07/23/20 0458  WBC 7.3 6.2 7.4 7.7 9.0  NEUTROABS 4.8 4.2 5.1 5.5 6.0  HGB 9.1* 7.7* 11.0* 9.3* 9.9*  HCT 26.8* 23.0* 33.3* 28.1* 30.0*  MCV 84.5  85.2 86.7 87.5 88.2  PLT 8* 7* 9* 54* 34*   Basic Metabolic Panel: Recent Labs  Lab 07/19/20 0902 07/20/20 0357 07/21/20 0900 07/22/20 0337 07/23/20 0458  NA 142 143 143 139 145  K 3.8 3.7 3.1* 3.5 3.4*  CL 112* 113* 109 106 110  CO2 22 22 25 24 24   GLUCOSE 101* 110* 138* 116* 106*  BUN 54* 56* 48* 45* 41*  CREATININE 2.15* 1.98* 2.17* 1.99* 1.77*  CALCIUM 7.6* 7.5* 7.7* 7.3* 7.5*   GFR: Estimated Creatinine Clearance: 36.4 mL/min (A) (by C-G formula based on SCr of 1.77 mg/dL (H)). Liver Function Tests: No results for input(s): AST, ALT, ALKPHOS, BILITOT, PROT, ALBUMIN in the last 168 hours. No results for input(s): LIPASE, AMYLASE in the last 168 hours. No results for input(s): AMMONIA in the last 168 hours. Coagulation Profile: No results for input(s): INR, PROTIME in the last 168 hours. Cardiac Enzymes: No results for input(s): CKTOTAL, CKMB, CKMBINDEX, TROPONINI in the last 168 hours. BNP (last 3 results) No results for input(s): PROBNP in the last 8760 hours. HbA1C: No  results for input(s): HGBA1C in the last 72 hours. CBG: Recent Labs  Lab 07/22/20 0800 07/22/20 1206 07/22/20 1701 07/23/20 0740 07/23/20 1128  GLUCAP 81 94 150* 80 126*   Lipid Profile: No results for input(s): CHOL, HDL, LDLCALC, TRIG, CHOLHDL, LDLDIRECT in the last 72 hours. Thyroid Function Tests: No results for input(s): TSH, T4TOTAL, FREET4, T3FREE, THYROIDAB in the last 72 hours. Anemia Panel: No results for input(s): VITAMINB12, FOLATE, FERRITIN, TIBC, IRON, RETICCTPCT in the last 72 hours. Sepsis Labs: No results for input(s): PROCALCITON, LATICACIDVEN in the last 168 hours.  No results found for this or any previous visit (from the past 240 hour(s)).    Radiology Studies: No results found.    Scheduled Meds: . Chlorhexidine Gluconate Cloth  6 each Topical Daily  . [START ON 07/26/2020] dexamethasone  2 mg Oral Daily  . dexamethasone  4 mg Oral Daily  . ferrous sulfate  325 mg Oral BID WC  . folic acid  1 mg Oral Daily  . furosemide  40 mg Oral Daily  . insulin aspart  0-9 Units Subcutaneous TID WC  . mouth rinse  15 mL Mouth Rinse BID  . pantoprazole  40 mg Oral BID  . potassium chloride  40 mEq Oral Daily  . romiPLOStim  1 mcg/kg Subcutaneous Weekly  . sodium chloride flush  10-40 mL Intracatheter Q12H   Continuous Infusions:    LOS: 22 days     Alma Friendly, MD [facility] 07/23/2020, 1:19 PM   Available via Epic secure chat 7am-7pm After these hours, please refer to coverage provider listed on [email or web address]

## 2020-07-23 NOTE — Progress Notes (Signed)
HEMATOLOGY-ONCOLOGY PROGRESS NOTE  SUBJECTIVE: No bleeding this am. Awaiting SNF placement.   Oncology History  Malignant neoplasm of right lung ([place])  09/14/2019 Initial Diagnosis   Adenocarcinoma of right lung, stage 4 ([place])   09/28/2019 -  Chemotherapy    Patient is on Treatment Plan: LUNG CARBOPLATIN / PEMETREXED / PEMBROLIZUMAB Q21D INDUCTION X 4 CYCLES / MAINTENANCE PEMETREXED + PEMBROLIZUMAB      04/17/2020 Cancer Staging   Staging form: Lung, AJCC 8th Edition - Clinical: Stage IVA (cT2a, cN2, cM1b) - Signed by Curt Bears, MD on 04/17/2020      REVIEW OF SYSTEMS:   Constitutional: Denies fevers, chills Eyes: Denies blurriness of vision Ears, nose, mouth, throat, and face: Denies mucositis or sore throat Respiratory: Denies cough, dyspnea or wheezes Cardiovascular: Denies palpitation, chest discomfort Gastrointestinal: Denies GI bleeding this morning Skin: Denies abnormal skin rashes Lymphatics: Denies new lymphadenopathy or easy bruising Neurological:Denies numbness, tingling or new weaknesses Behavioral/Psych: Mood is stable, no new changes  Extremities: No lower extremity edema All other systems were reviewed with the patient and are negative.  I have reviewed the past medical history, past surgical history, social history and family history with the patient and they are unchanged from previous note.   PHYSICAL EXAMINATION: ECOG PERFORMANCE STATUS: 2 - Symptomatic, <50% confined to bed  Vitals:   07/23/20 0438 07/23/20 0548  BP: (!) 166/91 (!) 159/89  Pulse: 100 86  Resp: 18   Temp: (!) 97.4 F (36.3 C)   SpO2: 98%    Filed Weights   07/20/20 0411 07/22/20 0333 07/23/20 0500  Weight: 73.5 kg 73.2 kg 75.5 kg    Intake/Output from previous day: 05/02 0701 - 05/03 0700 In: 237 [P.O.:237] Out: 2300 [Urine:2300]  GENERAL:alert, no distress and comfortable SKIN: skin color, texture, turgor are normal, no rashes or significant lesions EYES: normal,  Conjunctiva are pink and non-injected, sclera clear OROPHARYNX:no exudate, no erythema and lips, buccal mucosa, and tongue normal  LUNGS: clear to auscultation and percussion with normal breathing effort HEART: regular rate & rhythm and no murmurs and no lower extremity edema ABDOMEN:abdomen soft, non-tender and normal bowel sounds NEURO: alert & oriented x 3 with fluent speech, no focal motor/sensory deficits  LABORATORY DATA:  I have reviewed the data as listed CMP Latest Ref Rng & Units 07/23/2020 07/22/2020 07/21/2020  Glucose 70 - 99 mg/dL 106(H) 116(H) 138(H)  BUN 6 - 20 mg/dL 41(H) 45(H) 48(H)  Creatinine 0.44 - 1.00 mg/dL 1.77(H) 1.99(H) 2.17(H)  Sodium 135 - 145 mmol/L 145 139 143  Potassium 3.5 - 5.1 mmol/L 3.4(L) 3.5 3.1(L)  Chloride 98 - 111 mmol/L 110 106 109  CO2 22 - 32 mmol/L 24 24 25   Calcium 8.9 - 10.3 mg/dL 7.5(L) 7.3(L) 7.7(L)  Total Protein 6.5 - 8.1 g/dL - - -  Total Bilirubin 0.3 - 1.2 mg/dL - - -  Alkaline Phos 38 - 126 U/L - - -  AST 15 - 41 U/L - - -  ALT 0 - 44 U/L - - -    Lab Results  Component Value Date   WBC 9.0 07/23/2020   HGB 9.9 (L) 07/23/2020   HCT 30.0 (L) 07/23/2020   MCV 88.2 07/23/2020   PLT 34 (L) 07/23/2020   NEUTROABS 6.0 07/23/2020    CT Head Wo Contrast  Result Date: 07/01/2020 CLINICAL DATA:  Recent seizure activity EXAM: CT HEAD WITHOUT CONTRAST TECHNIQUE: Contiguous axial images were obtained from the base of the skull through the vertex  without intravenous contrast. COMPARISON:  04/07/2020 FINDINGS: Brain: Mild atrophic changes are noted. No findings to suggest acute hemorrhage, acute infarction or space-occupying mass lesion noted. Vascular: No hyperdense vessel or unexpected calcification. Skull: Normal. Negative for fracture or focal lesion. Sinuses/Orbits: No acute finding. Chronic soft tissue changes are noted in the left ethmoid sinuses. Other: None. IMPRESSION: Mild atrophic changes without acute abnormality. Electronically  Signed   By: Inez Catalina M.D.   On: 07/01/2020 15:05   NM GI Blood Loss  Result Date: 07/02/2020 CLINICAL DATA:  Acute lower GI bleeding, 3 episodes of large volume hematochezia. History of lung cancer EXAM: NUCLEAR MEDICINE GASTROINTESTINAL BLEEDING SCAN TECHNIQUE: Sequential abdominal images were obtained following intravenous administration of Tc-59mlabeled red blood cells. RADIOPHARMACEUTICALS:  22.7 mCi Tc-967mertechnetate in-vitro labeled red cells. COMPARISON:  05/21/2020 CT with contrast FINDINGS: Abnormal activity within the mid and lower abdomen extending into the upper pelvis and peristalsing throughout loops of bowel compatible with mild acute GI bleeding. Based on the pattern of peristalsis, it is difficult to determine location but small bowel is favored over colon. IMPRESSION: Positive exam for mild acute GI bleeding as above. These results will be called to the ordering clinician or representative by the Radiologist Assistant, and communication documented in the PACS or ClFrontier Oil CorporationElectronically Signed   By: M.Jerilynn Mages Shick M.D.   On: 07/02/2020 12:08   [place]NAL  Result Date: 07/01/2020 CLINICAL DATA:  Acute kidney injury. EXAM: RENAL / URINARY TRACT ULTRASOUND COMPLETE COMPARISON:  CT abdomen pelvis dated May 21, 2020. FINDINGS: Right Kidney: Renal measurements: 10.0 x 4.2 x 5.0 cm = volume: 109 mL. Echogenicity within normal limits. No mass or hydronephrosis visualized. Left Kidney: Renal measurements: 9.1 x 5.1 x 5.3 cm = volume: 128 mL. Echogenicity within normal limits. No mass or hydronephrosis visualized. Other: Bilateral pleural effusions. IMPRESSION: 1. Normal renal ultrasound. 2. Bilateral pleural effusions. Electronically Signed   By: WiTitus Dubin.D.   On: 07/01/2020 15:55   DG CHEST PORT 1 VIEW  Result Date: 07/07/2020 CLINICAL DATA:  History of lung carcinoma with weakness EXAM: PORTABLE CHEST 1 VIEW COMPARISON:  07/01/2020 FINDINGS: Cardiac shadow is stable.  Right-sided chest wall port is again noted and stable. New bilateral pleural effusions are noted left greater than right with likely underlying atelectasis. No acute bony abnormality is seen. IMPRESSION: New bilateral pleural effusions left greater than right. Electronically Signed   By: MaInez Catalina.D.   On: 07/07/2020 20:13   DG Chest Port 1 View  Result Date: 07/01/2020 CLINICAL DATA:  Shortness of breath EXAM: PORTABLE CHEST 1 VIEW COMPARISON:  April 04, 2019 FINDINGS: Port-A-Cath tip is in the superior vena cava. No pneumothorax. There is a rather minimal right pleural effusion. No edema or airspace opacity. Heart size and pulmonary vascularity are normal. No adenopathy. No bone lesions. IMPRESSION: Port-A-Cath as noted. Other minimal right pleural effusion. Lungs otherwise clear. Cardiac silhouette normal. Electronically Signed   By: WiLowella GripII M.D.   On: 07/01/2020 11:44   VAS [place]PPER EXTREMITY VENOUS DUPLEX  Result Date: 07/21/2020 UPPER VENOUS STUDY  Patient Name:  MAREBECCAH IVINSDate of Exam:   07/21/2020 Medical Rec #: [id number]     Accession #:    [id number] of Birth: 6/23-Sep-1960     Patient Gender: F Patient Age:   59Y [facility]      V[facility] [place]ER EXTREMITY VENOUS DUPLEX  Referring Phys: [id number] [place] --------------------------------------------------------------------------------  Indications: Edema Comparison Study: no prior Performing Technologist: Abram Sander RVS  Examination Guidelines: A complete evaluation includes B-mode imaging, spectral Doppler, color Doppler, and power Doppler as needed of all accessible portions of each vessel. Bilateral testing is considered an integral part of a complete examination. Limited examinations for reoccurring indications may be performed as noted.  Right Findings: +----------+------------+---------+-----------+----------+-------+ RIGHT      CompressiblePhasicitySpontaneousPropertiesSummary +----------+------------+---------+-----------+----------+-------+ IJV           Full       Yes       Yes                      +----------+------------+---------+-----------+----------+-------+ Subclavian    Full       Yes       Yes                      +----------+------------+---------+-----------+----------+-------+ Axillary      Full       Yes       Yes                      +----------+------------+---------+-----------+----------+-------+ Brachial      Full       Yes       Yes                      +----------+------------+---------+-----------+----------+-------+ Radial        Full                                          +----------+------------+---------+-----------+----------+-------+ Ulnar         Full                                          +----------+------------+---------+-----------+----------+-------+ Cephalic      Full                                          +----------+------------+---------+-----------+----------+-------+ Basilic       Full                                          +----------+------------+---------+-----------+----------+-------+  Left Findings: +----------+------------+---------+-----------+----------+-----------------+ LEFT      CompressiblePhasicitySpontaneousProperties     Summary      +----------+------------+---------+-----------+----------+-----------------+ IJV           Full       Yes       Yes                                +----------+------------+---------+-----------+----------+-----------------+ Subclavian    Full       Yes       Yes                                +----------+------------+---------+-----------+----------+-----------------+ Axillary      Full       Yes  Yes                                +----------+------------+---------+-----------+----------+-----------------+ Brachial      Full       Yes       Yes                                 +----------+------------+---------+-----------+----------+-----------------+ Radial        Full                                                    +----------+------------+---------+-----------+----------+-----------------+ Ulnar         Full                                                    +----------+------------+---------+-----------+----------+-----------------+ Cephalic      None                                  Age Indeterminate +----------+------------+---------+-----------+----------+-----------------+ Basilic       Full                                                    +----------+------------+---------+-----------+----------+-----------------+  Summary:  Right: No evidence of deep vein thrombosis in the upper extremity. No evidence of superficial vein thrombosis in the upper extremity.  Left: Findings consistent with age indeterminate superficial vein thrombosis involving the left cephalic vein.  *See table(s) above for measurements and observations.  Diagnosing physician: Harold Barban MD Electronically signed by Harold Barban MD on 07/21/2020 at 4:26:43 PM.    Final     ASSESSMENT AND PLAN: This is a 60 year old African-American female with stage IV (T2a, N2, M1b) non-small cell lung cancer, adenocarcinoma presented with a large right upper lobe lung mass in addition to suspicious right paratracheal lymphadenopathy and bilateral pulmonary nodules as well as retroperitoneal lymphadenopathy diagnosed in May 2021.  She has no actionable mutations and is negative for PD-L1.  The patient started palliative systemic chemotherapy with carboplatin for an AUC of 5, Alimta 500 mg/m, and Keytruda 200 mg IV every 3 weeks.  Starting from cycle #5, she has been on maintenance Alimta and Keytruda.  Her Alimta dose has been dose reduced secondary to neutropenia.  She has also required intermittent holding of Alimta due to renal insufficiency.  She last  received her chemotherapy on 06/18/2020.  The patient is now admitted GI bleed, pancytopenia, AKI, seizure-like activity, and probable sepsis with ?staph bacteremia.  Bleeding scan showed mild acute GI bleeding thought to be small bowel in origin.   The patient received Granix with improvement of her white blood cell count.  Hemoglobin is stable at this time.  She is not bleeding anymore.  The patient has received multiple platelet transfusions, IV dexamethasone, and a dose of Nplate.  Platelet count down slightly today to 34,000.  Recommend tapering dexamethasone and will start 4 mg p.o. daily x3 days and then 2 mg p.o. daily x3 days and then stop.  She was advised to take oral dexamethasone with food. OK to discharge from our standpoint if platelet count remains above 20,000.   We will plan to see the patient back in the office after discharge to discuss further treatment.  We will consider discontinuing Alimta and continuing maintenance immunotherapy with Keytruda only.  She was scheduled to see [place] today and I have cancelled this appointment. She has an appointment scheduled for 5/11.    LOS: 22 days   Mikey Bussing, DNP, AGPCNP-BC, AOCNP 07/23/20

## 2020-07-23 NOTE — Progress Notes (Signed)
RN attempted a second time to get pt up with steady lift. Pt refused and educated on the importance of getting OOB to recliner. Pt continues to refused OOB activity.

## 2020-07-23 NOTE — NC FL2 (Signed)
[place] LEVEL OF CARE SCREENING TOOL     IDENTIFICATION  Patient Name: Brenda [name] Birthdate: Aug 21, 1960 Sex: female Admission Date (Current Location): 07/01/2020  [facility] and [place] Number:  Herbalist and Address:  [facility],  [address], [place]      Provider Number: [id number]  Attending Physician Name and Address:  Alma Friendly, MD  Relative Name and Phone Number:  Sherrin Daisy [phone]    Current Level of Care: Hospital Recommended Level of Care: [place] Prior Approval Number:    Date Approved/Denied:   PASRR Number: [id number]  Discharge Plan: SNF    Current Diagnoses: Patient Active Problem List   Diagnosis Date Noted  . Bacteremia associated with intravascular line ([place]) 07/03/2020  . Acute blood loss anemia   . Renal failure 07/01/2020  . AKI (acute kidney injury) ([place])   . Metabolic acidosis   . Epistaxis   . Seizure-like activity ([place])   . Swelling of lower extremity 05/29/2020  . Hypoalbuminemia 05/29/2020  . Acute on chronic anemia 04/07/2020  . Gastrointestinal hemorrhage 04/07/2020  . Acute encephalopathy 04/07/2020  . Pancytopenia ([place]) 04/07/2020  . Polysubstance abuse ([place]) 04/07/2020  . Hypokalemia 10/19/2019  . Neutropenia ([place]) 10/05/2019  . Malignant neoplasm of right lung ([place]) 09/14/2019  . Encounter for antineoplastic chemotherapy 09/14/2019  . Encounter for antineoplastic immunotherapy 09/14/2019  . Goals of care, counseling/discussion 09/14/2019  . Tobacco abuse counseling 09/14/2019  . 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 Alcohol abuse   . Acute respiratory failure ([place]) 04/03/2019  . Lung mass   . Multifocal pneumonia   . Major depressive disorder   . Major depressive disorder, recurrent episode with mood-congruent psychotic features ([place]) 05/30/2017  . 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[place]) 12/26/2011     Orientation RESPIRATION BLADDER Height & Weight     Self,Time,Situation,Place  Normal Continent Weight: 75.5 kg Height:  5\' 6"  (167.6 cm)  BEHAVIORAL SYMPTOMS/MOOD NEUROLOGICAL BOWEL NUTRITION STATUS      Continent Diet (Soft)  AMBULATORY STATUS COMMUNICATION OF NEEDS Skin   Limited Assist Verbally Skin abrasions (RUE-lotion)                       Personal Care Assistance Level of Assistance  Bathing,Feeding,Dressing Bathing Assistance: Limited assistance Feeding assistance: Limited assistance Dressing Assistance: Limited assistance     Functional Limitations Info  Sight,Hearing,Speech Sight Info: Adequate Hearing Info: Adequate Speech Info: Adequate    SPECIAL CARE FACTORS FREQUENCY  PT (By licensed PT),OT (By licensed OT)     PT Frequency:  (5x week) OT Frequency:  (5x week)            Contractures Contractures Info: Not present    Additional Factors Info  Code Status,Allergies,Insulin Sliding Scale,Isolation Precautions Code Status Info:  (Full) Allergies Info:  (Aspirin)   Insulin Sliding Scale Info:  (SSI) Isolation Precautions Info:  (Protective isolation-Immunocompromised-Lung Ca.)     Current Medications (07/23/2020):  This is the current hospital active medication list Current Facility-Administered Medications  Medication Dose Route Frequency Provider Last Rate Last Admin  . acetaminophen (TYLENOL) tablet 650 mg  650 mg Oral Q6H PRN Opyd, Ilene Qua, MD   650 mg at 07/19/20 2314  . alum & mag hydroxide-simeth (MAALOX/MYLANTA) 200-200-20 MG/5ML suspension 15-30 mL  15-30 mL Oral Q4H PRN Cherene Altes, MD   30 mL at 07/12/20 1507  . Chlorhexidine Gluconate Cloth 2 %  PADS 6 each  6 each Topical Daily Hunsucker, Bonna Gains, MD   6 each at 07/23/20 [phone]  . [START ON 07/26/2020] dexamethasone (DECADRON) tablet 2 mg  2 mg Oral Daily Curcio, Kristin R, NP      . dexamethasone (DECADRON) tablet 4 mg  4 mg Oral Daily Curcio, Kristin R, NP      .  diphenhydrAMINE (BENADRYL) capsule 25 mg  25 mg Oral Q6H PRN Dessa Phi, DO   25 mg at 07/22/20 0225  . docusate sodium (COLACE) capsule 100 mg  100 mg Oral BID PRN Gleason, Otilio Carpen, PA-C      . ferrous sulfate tablet 325 mg  325 mg Oral BID WC Dessa Phi, DO   325 mg at 07/23/20 0947  . folic acid (FOLVITE) tablet 1 mg  1 mg Oral Daily Dessa Phi, DO   1 mg at 07/23/20 0947  . furosemide (LASIX) tablet 40 mg  40 mg Oral Daily Alma Friendly, MD   40 mg at 07/23/20 0947  . Gerhardt's butt cream   Topical PRN Flora Lipps, MD   Given at 07/15/20 1744  . hydrALAZINE (APRESOLINE) injection 10 mg  10 mg Intravenous Q6H PRN Marcell Anger, MD   10 mg at 07/23/20 0451  . hydrocortisone 1 % lotion   Topical BID PRN Dessa Phi, DO   Given at 07/11/20 1052  . insulin aspart (novoLOG) injection 0-9 Units  0-9 Units Subcutaneous TID WC Dessa Phi, DO   1 Units at 07/22/20 1704  . ipratropium-albuterol (DUONEB) 0.5-2.5 (3) MG/3ML nebulizer solution 3 mL  3 mL Nebulization Q4H PRN Dessa Phi, DO      . loperamide (IMODIUM) capsule 2 mg  2 mg Oral PRN Dessa Phi, DO   2 mg at 07/11/20 0915  . MEDLINE mouth rinse  15 mL Mouth Rinse BID Marcell Anger, MD   15 mL at 07/23/20 0948  . ondansetron (ZOFRAN) injection 4 mg  4 mg Intravenous Q8H PRN Lovey Newcomer T, NP   4 mg at 07/20/20 0907  . pantoprazole (PROTONIX) EC tablet 40 mg  40 mg Oral BID Cherene Altes, MD   40 mg at 07/23/20 0948  . potassium chloride SA (KLOR-CON) CR tablet 40 mEq  40 mEq Oral Daily Alma Friendly, MD   40 mEq at 07/23/20 0947  . romiPLOStim (NPLATE) injection 75 mcg  1 mcg/kg Subcutaneous Weekly Mikey Bussing R, NP   75 mcg at 07/19/20 1150  . sodium chloride flush (NS) 0.9 % injection 10-40 mL  10-40 mL Intracatheter Q12H Cherene Altes, MD   10 mL at 07/21/20 2210  . sodium chloride flush (NS) 0.9 % injection 10-40 mL  10-40 mL Intracatheter PRN Cherene Altes,  MD   10 mL at 07/17/20 0404  . traMADol (ULTRAM) tablet 50 mg  50 mg Oral Q6H PRN Cherene Altes, MD   50 mg at 07/22/20 0133     Discharge Medications: Please see discharge summary for a list of discharge medications.  Relevant Imaging Results:  Relevant Lab Results:   Additional Information SN#[id number]  Mischelle Reeg, Juliann Pulse, RN

## 2020-07-23 NOTE — TOC Progression Note (Signed)
Transition of Care [facility]) - Progression Note    Patient Details  Name: Brenda [name] MRN: [id number] Date of Birth: [birth year]  Transition of Care [facility]) CM/SW Contact  Delesa Kawa, Juliann Pulse, RN Phone Number: 07/23/2020, 10:06 AM  Clinical Narrative: Patient agreed to SNF-faxed out for SNF-await bed offers.      Expected Discharge Plan: Skilled Nursing Facility Barriers to Discharge: Continued Medical Work up  Expected Discharge Plan and Services Expected Discharge Plan: [place] In-house Referral: Clinical Social Work Discharge Planning Services: CM Consult Post Acute Care Choice: Durable Medical Equipment Living arrangements for the past 2 months: Single Family Home                 DME Arranged: Walker rolling with seat DME Agency: [employer] Date DME Agency Contacted: 07/17/20 Time DME Agency Contacted: 0932 Representative spoke with at DME Agency: [place] ([place]) Interventions    Readmission Risk Interventions Readmission Risk Prevention Plan 07/17/2020 04/11/2020  Transportation Screening Complete Complete  PCP or Specialist Appt within 3-5 Days - Complete  HRI or [place] Complete Complete  Social Work Consult for [place] Planning/Counseling Complete Complete  Palliative Care Screening Not Applicable Not Applicable  Medication Review Press photographer) Complete Complete  Some recent data might be hidden

## 2020-07-23 NOTE — Progress Notes (Signed)
RN and CNA attempted to get pt up with Steady lift and pt refused. Pt reported she just wanted to lay in bed. RN informed pt that it was important for her to get out of bed and moving with her being discharged to rehab, pt still refused. RN informed pt that RN and CNA will attempt again this afternoon to get pt OOB to recliner with steady lift.

## 2020-07-24 DIAGNOSIS — D649 Anemia, unspecified: Secondary | ICD-10-CM | POA: Diagnosis not present

## 2020-07-24 DIAGNOSIS — T827XXD Infection and inflammatory reaction due to other cardiac and vascular devices, implants and grafts, subsequent encounter: Secondary | ICD-10-CM | POA: Diagnosis not present

## 2020-07-24 DIAGNOSIS — N179 Acute kidney failure, unspecified: Secondary | ICD-10-CM | POA: Diagnosis not present

## 2020-07-24 DIAGNOSIS — D62 Acute posthemorrhagic anemia: Secondary | ICD-10-CM | POA: Diagnosis not present

## 2020-07-24 LAB — CBC WITH DIFFERENTIAL/PLATELET
Abs Immature Granulocytes: 0.39 10*3/uL — ABNORMAL HIGH (ref 0.00–0.07)
Basophils Absolute: 0 10*3/uL (ref 0.0–0.1)
Basophils Relative: 0 %
Eosinophils Absolute: 0 10*3/uL (ref 0.0–0.5)
Eosinophils Relative: 0 %
HCT: 29.4 % — ABNORMAL LOW (ref 36.0–46.0)
Hemoglobin: 9.8 g/dL — ABNORMAL LOW (ref 12.0–15.0)
Immature Granulocytes: 4 %
Lymphocytes Relative: 20 %
Lymphs Abs: 1.9 10*3/uL (ref 0.7–4.0)
MCH: 29.4 pg (ref 26.0–34.0)
MCHC: 33.3 g/dL (ref 30.0–36.0)
MCV: 88.3 fL (ref 80.0–100.0)
Monocytes Absolute: 1.4 10*3/uL — ABNORMAL HIGH (ref 0.1–1.0)
Monocytes Relative: 15 %
Neutro Abs: 5.5 10*3/uL (ref 1.7–7.7)
Neutrophils Relative %: 61 %
Platelets: 32 10*3/uL — ABNORMAL LOW (ref 150–400)
RBC: 3.33 MIL/uL — ABNORMAL LOW (ref 3.87–5.11)
RDW: 14.6 % (ref 11.5–15.5)
WBC: 9.2 10*3/uL (ref 4.0–10.5)
nRBC: 0 % (ref 0.0–0.2)

## 2020-07-24 LAB — BPAM RBC
Blood Product Expiration Date: 202206032359
Blood Product Expiration Date: 202206032359
ISSUE DATE / TIME: 202205010214
Unit Type and Rh: 5100
Unit Type and Rh: 5100

## 2020-07-24 LAB — TYPE AND SCREEN
ABO/RH(D): O POS
Antibody Screen: POSITIVE
Donor AG Type: NEGATIVE
Donor AG Type: NEGATIVE
Unit division: 0
Unit division: 0

## 2020-07-24 LAB — BASIC METABOLIC PANEL
Anion gap: 9 (ref 5–15)
BUN: 39 mg/dL — ABNORMAL HIGH (ref 6–20)
CO2: 26 mmol/L (ref 22–32)
Calcium: 7.5 mg/dL — ABNORMAL LOW (ref 8.9–10.3)
Chloride: 110 mmol/L (ref 98–111)
Creatinine, Ser: 1.99 mg/dL — ABNORMAL HIGH (ref 0.44–1.00)
GFR, Estimated: 29 mL/min — ABNORMAL LOW (ref >60)
Glucose, Bld: 73 mg/dL (ref 70–99)
Potassium: 3.5 mmol/L (ref 3.5–5.1)
Sodium: 145 mmol/L (ref 135–145)

## 2020-07-24 LAB — GLUCOSE, CAPILLARY
Glucose-Capillary: 80 mg/dL (ref 70–99)
Glucose-Capillary: 123 mg/dL — ABNORMAL HIGH (ref 70–99)
Glucose-Capillary: 97 mg/dL (ref 70–99)

## 2020-07-24 LAB — SARS CORONAVIRUS 2 (TAT 6-24 HRS): SARS Coronavirus 2: NEGATIVE

## 2020-07-24 MED ORDER — POLYETHYLENE GLYCOL 3350 17 G PO PACK
17.0000 g | PACK | Freq: Two times a day (BID) | ORAL | Status: DC
  Administered 2020-07-24 – 2020-07-30 (×10): 17 g via ORAL
  Filled 2020-07-24 (×14): qty 1

## 2020-07-24 MED ORDER — BISACODYL 5 MG PO TBEC
10.0000 mg | DELAYED_RELEASE_TABLET | Freq: Once | ORAL | Status: AC
  Administered 2020-07-24: 10 mg via ORAL
  Filled 2020-07-24: qty 2

## 2020-07-24 MED ORDER — BISACODYL 5 MG PO TBEC: 10.0000 mg | DELAYED_RELEASE_TABLET | Freq: Every day | ORAL | Status: DC | PRN

## 2020-07-24 NOTE — TOC Progression Note (Signed)
Transition of Care [facility]) - Progression Note    Patient Details  Name: Brenda [name] MRN: [id number] Date of Birth: 1960-10-10  Transition of Care [facility]) CM/SW Contact  Deven Audi, Juliann Pulse, RN Phone Number: 07/24/2020, 9:21 AM  Clinical Narrative:  Currently 1 bed offer-[facility] rep Debbie-will start auth-await auth.     Expected Discharge Plan: Skilled Nursing Facility Barriers to Discharge: Continued Medical Work up  Expected Discharge Plan and Services Expected Discharge Plan: [place] In-house Referral: Clinical Social Work Discharge Planning Services: CM Consult Post Acute Care Choice: Durable Medical Equipment Living arrangements for the past 2 months: Single Family Home                 DME Arranged: Walker rolling with seat DME Agency: [employer] Date DME Agency Contacted: 07/17/20 Time DME Agency Contacted: [id number] Representative spoke with at DME Agency: [place] ([place]) Interventions    Readmission Risk Interventions Readmission Risk Prevention Plan 07/17/2020 04/11/2020  Transportation Screening Complete Complete  PCP or Specialist Appt within 3-5 Days - Complete  HRI or [place] Complete Complete  Social Work Consult for [place] Planning/Counseling Complete Complete  Palliative Care Screening Not Applicable Not Applicable  Medication Review Press photographer) Complete Complete  Some recent data might be hidden

## 2020-07-24 NOTE — Progress Notes (Signed)
OT Cancellation Note  Patient Details Name: Brenda [name] MRN: [id number] DOB: Sep 19, 1960   Cancelled Treatment:    Reason Eval/Treat Not Completed: Other (comment). Patient once again refuses participation in therapy today. Patient has not made progress towards goals and her participation either none or minimal. RN reports she refused OOB all day yesterday as well. OT will sign off at this time. If patient wants to [place] participate in recovery please re-order OT. Patient needs 24/7 custodial/nursing care at this time.  [name] [name] 07/24/2020, 1:34 PM

## 2020-07-24 NOTE — Progress Notes (Signed)
PROGRESS NOTE    Brenda [name]  PYP:[id number] DOB: 21-Dec-1960 DOA: 07/01/2020 PCP: Pcp, No    Brief Narrative:  Brenda [name] was admitted to the hospital with a working diagnosis of acute kidney injury and metabolic acidosis, complicated by uncontrolled seizures and severe anemia/ pancytopenia.  60 year old female past medical history of stage IV lung adenocarcinoma who recently received 12 cycle of pemetrexed and pembrolizumab 3/29, who presented with epistaxis, pancytopenia and renal failure.  In the emergency department she was noted to have a seizure.  Her vital signs include blood pressure 101/71, pulse rate 113, temperature 89.8, respiratory rate 28, oxygen saturation 100%.  Lungs are clear to auscultation bilaterally, heart S1-S2, present, rhythmic, soft abdomen, no lower extremity edema.  Patient was fatigued, somnolent and hyper reactive.  Prolonged hospitalization complicated by coag negative staph bacteremia,  Assessment & Plan:   Principal Problem:   Acute on chronic anemia Active Problems:   Malignant neoplasm of right lung (HCC)   Hypokalemia   Gastrointestinal hemorrhage   Pancytopenia (HCC)   Renal failure   AKI (acute kidney injury) ([place])   Acute blood loss anemia   Bacteremia associated with intravascular line ([place])   1. Pancytopenia, chemotherapy induced. Stage IV lung cancer.  Cell count this am with wbc at 9,2, hgb 9,8 and platelet at 32. Patient has received multiple PRBC transfusion, along with Granix and steroids.  Plan to continue steroid taper at discharge.  Continue iron supplementation for iron deficiency anemia.,   2. AKI/ anasarca. Stable renal function, patient is tolerating po well. Continue to avoid hypotension or nephrotoxic medications.  Renal function with serum cr at 1,99 with K at 3,5 and bicarbonate at 26.   Continue with daily furosemide.   3. Small bowel bleeding. Continue close monitoring, no signs of active bleeding. Continue with  pantoprazole.   4. Seizures. Clinically controlled, continue neuro checks per unit protocol.  5. Right upper extremity phlebitis, superficial vein thrombosis in the cephalic vein. Coag negative staph bacteremia,  Completed antibiotic therapy.   6. HTN. Continue blood pressure control with amlodipine.    7. T2DM. Continue glucose control and monitoring with insulin sliding scale.   Patient continue to be at high risk for worsening pancytopenia   Status is: Inpatient  Remains inpatient appropriate because:IV treatments appropriate due to intensity of illness or inability to take PO   Dispo:  Patient From: Home  Planned Disposition: [place]  Medically stable for discharge: No     DVT prophylaxis: Enoxaparin   Code Status:   full  Family Communication:  No family at the bedside         Subjective: Patient with no chest pain or dyspnea, continue to be very weak and deconditioned, she declined SNF.   Objective: Vitals:   07/23/20 1130 07/23/20 2049 07/24/20 0626 07/24/20 1446  BP: 135/80 (!) 151/88 (!) 162/97 (!) 142/108  Pulse: 100 88 97 95  Resp: 19 18 20 18   Temp: 97.9 F (36.6 C) 98.3 F (36.8 C) (!) 97.2 F (36.2 C) 98.1 F (36.7 C)  TempSrc: Oral Oral Oral Oral  SpO2: 98% 98%  96%  Weight:   71.5 kg   Height:        Intake/Output Summary (Last 24 hours) at 07/24/2020 1630 Last data filed at 07/24/2020 0950 Gross per 24 hour  Intake 240 ml  Output 1450 ml  Net -1210 ml   Filed Weights   07/22/20 0333 07/23/20 0500 07/24/20 0626  Weight: 73.2  kg 75.5 kg 71.5 kg    Examination:   General: Not in pain or dyspnea, deconditioned  Neurology: Awake and alert, non focal  E ENT: no pallor, no icterus, oral mucosa moist Cardiovascular: No JVD. S1-S2 present, rhythmic, no gallops, rubs, or murmurs. No lower extremity edema. Pulmonary: positive breath sounds bilaterally, adequate air movement, no wheezing, rhonchi or rales. Gastrointestinal.  Abdomen soft and non tender Skin. No rashes Musculoskeletal: no joint deformities     Data Reviewed: I have personally reviewed following labs and imaging studies  CBC: Recent Labs  Lab 07/20/20 0357 07/21/20 0900 07/22/20 0337 07/23/20 0458 07/24/20 0357  WBC 6.2 7.4 7.7 9.0 9.2  NEUTROABS 4.2 5.1 5.5 6.0 5.5  HGB 7.7* 11.0* 9.3* 9.9* 9.8*  HCT 23.0* 33.3* 28.1* 30.0* 29.4*  MCV 85.2 86.7 87.5 88.2 88.3  PLT 7* 9* 54* 34* 32*   Basic Metabolic Panel: Recent Labs  Lab 07/20/20 0357 07/21/20 0900 07/22/20 0337 07/23/20 0458 07/24/20 0357  NA 143 143 139 145 145  K 3.7 3.1* 3.5 3.4* 3.5  CL 113* 109 106 110 110  CO2 22 25 24 24 26   GLUCOSE 110* 138* 116* 106* 73  BUN 56* 48* 45* 41* 39*  CREATININE 1.98* 2.17* 1.99* 1.77* 1.99*  CALCIUM 7.5* 7.7* 7.3* 7.5* 7.5*   GFR: Estimated Creatinine Clearance: 31.6 mL/min (A) (by C-G formula based on SCr of 1.99 mg/dL (H)). Liver Function Tests: No results for input(s): AST, ALT, ALKPHOS, BILITOT, PROT, ALBUMIN in the last 168 hours. No results for input(s): LIPASE, AMYLASE in the last 168 hours. No results for input(s): AMMONIA in the last 168 hours. Coagulation Profile: No results for input(s): INR, PROTIME in the last 168 hours. Cardiac Enzymes: No results for input(s): CKTOTAL, CKMB, CKMBINDEX, TROPONINI in the last 168 hours. BNP (last 3 results) No results for input(s): PROBNP in the last 8760 hours. HbA1C: No results for input(s): HGBA1C in the last 72 hours. CBG: Recent Labs  Lab 07/23/20 0740 07/23/20 1128 07/23/20 1613 07/24/20 0755 07/24/20 1209  GLUCAP 80 126* 102* 80 123*   Lipid Profile: No results for input(s): CHOL, HDL, LDLCALC, TRIG, CHOLHDL, LDLDIRECT in the last 72 hours. Thyroid Function Tests: No results for input(s): TSH, T4TOTAL, FREET4, T3FREE, THYROIDAB in the last 72 hours. Anemia Panel: No results for input(s): VITAMINB12, FOLATE, FERRITIN, TIBC, IRON, RETICCTPCT in the last 72  hours.    Radiology Studies: I have reviewed all of the imaging during this hospital visit personally     Scheduled Meds: . Chlorhexidine Gluconate Cloth  6 each Topical Daily  . [START ON 07/26/2020] dexamethasone  2 mg Oral Daily  . dexamethasone  4 mg Oral Daily  . ferrous sulfate  325 mg Oral BID WC  . folic acid  1 mg Oral Daily  . furosemide  40 mg Oral Daily  . insulin aspart  0-9 Units Subcutaneous TID WC  . mouth rinse  15 mL Mouth Rinse BID  . pantoprazole  40 mg Oral BID  . potassium chloride  40 mEq Oral Daily  . romiPLOStim  1 mcg/kg Subcutaneous Weekly  . sodium chloride flush  10-40 mL Intracatheter Q12H   Continuous Infusions:   LOS: 23 days        Lindzy Rupert Gerome Apley, MD

## 2020-07-24 NOTE — Progress Notes (Signed)
Physical Therapy Treatment Patient Details Name: Brenda [name] MRN: [id number] DOB: [birth year] Today's Date: 07/24/2020    History of Present Illness 60 years old female adm with Bacteremia associated with intravascular line, ARF, acute GI bleed, pancytopenia.   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 Julien Nordmann presented to the hospital with epistaxis, dark stools and generalized weakness for few days.  CXR showed Port-A-Cath in place and minimal right pleural effusion but otherwise clear.    PT Comments    Pt agreeable to bed level exercises but she declined OOB activity/mobility. Max encouragement and explanation of importance of mobilizing OOB. Reassured pt that we could have 2 staff members assist for safety. Pt acknowledges being weak. Explained to her that she will need to work hard here and in rehab to regain her strength.     Follow Up Recommendations  SNF     Equipment Recommendations       Recommendations for Other Services       Precautions / Restrictions Precautions Precautions: Fall Precaution Comments: monitor VS,  decr BP, decr platelets Restrictions Weight Bearing Restrictions: No    Mobility  Bed Mobility               General bed mobility comments: NT-pt declined OOB despite encouragement    Transfers                    Ambulation/Gait                 Stairs             Wheelchair Mobility    Modified Rankin (Stroke Patients Only)       Balance                                            Cognition Arousal/Alertness: Awake/alert Behavior During Therapy: WFL for tasks assessed/performed Overall Cognitive Status: Difficult to assess                                 General Comments: poor insight of deficits, limited participation. pt expresses fear of mobilizing (falling, getting dizzy, etc)       Exercises General Exercises - Lower Extremity Ankle Circles/Pumps: AROM;Both;10 reps Short Arc Quad: AROM;Both;10 reps Heel Slides: AROM;AAROM;Both;10 reps Hip ABduction/ADduction: AROM;Both;10 reps Straight Leg Raises: AAROM;Both;5 reps    General Comments        Pertinent Vitals/Pain Pain Assessment: No/denies pain    Home Living                      Prior Function            PT Goals (current goals can now be found in the care plan section) Progress towards PT goals: Not progressing toward goals - comment    Frequency    Min 2X/week      PT Plan Current plan remains appropriate    Co-evaluation              AM-PAC PT "6 Clicks" Mobility   Outcome Measure  Help needed turning from your back to your side while in a flat bed without using bedrails?: A Little Help needed moving from lying on your back to sitting on  the side of a flat bed without using bedrails?: A Little Help needed moving to and from a bed to a chair (including a wheelchair)?: A Lot Help needed standing up from a chair using your arms (e.g., wheelchair or bedside chair)?: A Lot Help needed to walk in hospital room?: Total Help needed climbing 3-5 steps with a railing? : Total 6 Click Score: 12    End of Session   Activity Tolerance:  (pt self limiting)     PT Visit Diagnosis: Muscle weakness (generalized) (M62.81);Difficulty in walking, not elsewhere classified (R26.2)     Time: 8592-7639 PT Time Calculation (min) (ACUTE ONLY): 18 min  Charges:  $Gait Training: 8-22 mins                         Doreatha Massed, PT Acute Rehabilitation  Office: [phone] Pager: [phone]

## 2020-07-25 DIAGNOSIS — N179 Acute kidney failure, unspecified: Secondary | ICD-10-CM | POA: Diagnosis not present

## 2020-07-25 DIAGNOSIS — D649 Anemia, unspecified: Secondary | ICD-10-CM | POA: Diagnosis not present

## 2020-07-25 DIAGNOSIS — D62 Acute posthemorrhagic anemia: Secondary | ICD-10-CM | POA: Diagnosis not present

## 2020-07-25 DIAGNOSIS — T827XXD Infection and inflammatory reaction due to other cardiac and vascular devices, implants and grafts, subsequent encounter: Secondary | ICD-10-CM | POA: Diagnosis not present

## 2020-07-25 LAB — BASIC METABOLIC PANEL
Anion gap: 12 (ref 5–15)
BUN: 37 mg/dL — ABNORMAL HIGH (ref 6–20)
CO2: 26 mmol/L (ref 22–32)
Calcium: 7.4 mg/dL — ABNORMAL LOW (ref 8.9–10.3)
Chloride: 106 mmol/L (ref 98–111)
Creatinine, Ser: 2.08 mg/dL — ABNORMAL HIGH (ref 0.44–1.00)
GFR, Estimated: 27 mL/min — ABNORMAL LOW (ref >60)
Glucose, Bld: 76 mg/dL (ref 70–99)
Potassium: 3.5 mmol/L (ref 3.5–5.1)
Sodium: 144 mmol/L (ref 135–145)

## 2020-07-25 LAB — CBC WITH DIFFERENTIAL/PLATELET
Abs Immature Granulocytes: 0.36 10*3/uL — ABNORMAL HIGH (ref 0.00–0.07)
Basophils Absolute: 0 10*3/uL (ref 0.0–0.1)
Basophils Relative: 0 %
Eosinophils Absolute: 0 10*3/uL (ref 0.0–0.5)
Eosinophils Relative: 0 %
HCT: 29.6 % — ABNORMAL LOW (ref 36.0–46.0)
Hemoglobin: 9.7 g/dL — ABNORMAL LOW (ref 12.0–15.0)
Immature Granulocytes: 4 %
Lymphocytes Relative: 18 %
Lymphs Abs: 1.6 10*3/uL (ref 0.7–4.0)
MCH: 29.4 pg (ref 26.0–34.0)
MCHC: 32.8 g/dL (ref 30.0–36.0)
MCV: 89.7 fL (ref 80.0–100.0)
Monocytes Absolute: 1.2 10*3/uL — ABNORMAL HIGH (ref 0.1–1.0)
Monocytes Relative: 14 %
Neutro Abs: 5.7 10*3/uL (ref 1.7–7.7)
Neutrophils Relative %: 64 %
Platelets: 37 10*3/uL — ABNORMAL LOW (ref 150–400)
RBC: 3.3 MIL/uL — ABNORMAL LOW (ref 3.87–5.11)
RDW: 14.7 % (ref 11.5–15.5)
WBC: 8.9 10*3/uL (ref 4.0–10.5)
nRBC: 0 % (ref 0.0–0.2)

## 2020-07-25 LAB — GLUCOSE, CAPILLARY
Glucose-Capillary: 75 mg/dL (ref 70–99)
Glucose-Capillary: 83 mg/dL (ref 70–99)

## 2020-07-25 NOTE — TOC Progression Note (Addendum)
Transition of Care [facility]) - Progression Note    Patient Details  Name: Brenda [name] MRN: [id number] Date of Birth: 10-05-1960  Transition of Care [facility]) CM/SW Contact  Jumanah Hynson, Juliann Pulse, RN Phone Number: 07/25/2020, 12:33 PM  Clinical Narrative: Left vm w/[facility] rep [place] if has received auth-await response. covid neg 5/3(no new covid test needed).Patient medically stable.      Expected Discharge Plan: Skilled Nursing Facility Barriers to Discharge: Insurance Authorization  Expected Discharge Plan and Services Expected Discharge Plan: [place] In-house Referral: Clinical Social Work Discharge Planning Services: CM Consult Post Acute Care Choice: Durable Medical Equipment Living arrangements for the past 2 months: Single Family Home                 DME Arranged: Walker rolling with seat DME Agency: [employer] Date DME Agency Contacted: 07/17/20 Time DME Agency Contacted: [phone] Representative spoke with at DME Agency: [place] ([place]) Interventions    Readmission Risk Interventions Readmission Risk Prevention Plan 07/17/2020 04/11/2020  Transportation Screening Complete Complete  PCP or Specialist Appt within 3-5 Days - Complete  HRI or [place] Complete Complete  Social Work Consult for [place] Planning/Counseling Complete Complete  Palliative Care Screening Not Applicable Not Applicable  Medication Review Press photographer) Complete Complete  Some recent data might be hidden

## 2020-07-25 NOTE — Progress Notes (Signed)
PROGRESS NOTE    Brenda [name]  NID:[id number] DOB: [birth year] DOA: 07/01/2020 PCP: Pcp, No    Brief Narrative:  Brenda [name] was admitted to the hospital with a working diagnosis of acute kidney injury and metabolic acidosis, complicated by uncontrolled seizures and severe anemia/ pancytopenia.  60 year old female past medical history of stage IV lung adenocarcinoma who recently received 12 cycle of pemetrexed and pembrolizumab 3/29, who presented with epistaxis, pancytopenia and renal failure.  In the emergency department she was noted to have a seizure.  Her vital signs include blood pressure 101/71, pulse rate 113, temperature 89.8, respiratory rate 28, oxygen saturation 100%.  Lungs were clear to auscultation bilaterally, heart S1-S2, present, rhythmic, soft abdomen, no lower extremity edema.  Patient was fatigued, somnolent and hyper reactive.  Patient received PRBC and platelet transfusion.  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 Her cell count stabilized.   With supportive medical therapy her kidney function improved.  Further work-up with RBC scan showed bleeding at the small bowel.  Gastroenterology recommended supportive noninvasive approach.  Prolonged hospitalization complicated by coag negative staph bacteremia, she received intravenous cefazolin with good toleration.   Assessment & Plan:   Principal Problem:   Acute on chronic anemia Active Problems:   Malignant neoplasm of right lung (HCC)   Hypokalemia   Gastrointestinal hemorrhage   Pancytopenia (HCC)   Renal failure   AKI (acute kidney injury) ([place])   Acute blood loss anemia   Bacteremia associated with intravascular line ([place])    1. Pancytopenia, chemotherapy induced. Stage IV lung cancer.  Cell count has stabilized with wbc at 8,9, Hgb 9,7/ hct 29,6, platelets continue to be low at 37, but no signs of bleeding.   Steroid taper for ITP.  On oral iron supplementation  for iron deficiency anemia, follow iron stores as outpatient.    2. AKI on CKD stage IV. Stable renal function with serum cr at 2,0 with K at 3,5 and serum bicarbonate at 26.  Patient is tolerating po well, clinically euvolemic.   Discontinue furosemide and K supplementation.   3. Small bowel bleeding. No further signs of bleeding. hgn has been stable. Advance to regular diet.   4. Seizures. Clinically controlled, resolved.   5. Right upper extremity phlebitis, superficial vein thrombosis in the cephalic vein. Coag negative staph bacteremia,  Clinically resolved,   6. HTN. On amlodipine for blood pressure control.     7. Hyperglycemia, steroid induced Fasting glucose is 76, capillary 126. 102. 80, 123, 97, 75. Discontinue insulin therapy.    Status is: Inpatient  Remains inpatient appropriate because:pending transfer to SNF   Dispo:  Patient From: Home  Planned Disposition: [place]  Medically stable for discharge: No     DVT prophylaxis: scd   Code Status:   full  Family Communication:  No family at the bedside     Consultants:   Oncology   ID    Subjective: Patient continue to be very weak and deconditioned, no nausea or vomiting, no chest pain or dyspnea.   Objective: Vitals:   07/24/20 0626 07/24/20 1446 07/24/20 2002 07/25/20 0344  BP: (!) 162/97 (!) 142/108 (!) 143/89 (!) 159/92  Pulse: 97 95 98 93  Resp: 20 18 20 18   Temp: (!) 97.2 F (36.2 C) 98.1 F (36.7 C) 98 F (36.7 C) 98.6 F (37 C)  TempSrc: Oral Oral Oral Oral  SpO2:  96% 98% 96%  Weight: 71.5 kg   69.2 kg  Height:        Intake/Output Summary (Last 24 hours) at 07/25/2020 1104 Last data filed at 07/25/2020 0841 Gross per 24 hour  Intake --  Output 2100 ml  Net -2100 ml   Filed Weights   07/23/20 0500 07/24/20 0626 07/25/20 0344  Weight: 75.5 kg 71.5 kg 69.2 kg    Examination:   General: Not in pain or dyspnea,  Neurology: Awake and alert, non focal  E  ENT: mild pallor, no icterus, oral mucosa moist Cardiovascular: No JVD. S1-S2 present, rhythmic, no gallops, rubs, or murmurs. No lower extremity edema. Pulmonary: positive breath sounds bilaterally, adequate air movement, no wheezing, rhonchi or rales. Gastrointestinal. Abdomen soft and non tender Skin. No rashes Musculoskeletal: no joint deformities     Data Reviewed: I have personally reviewed following labs and imaging studies  CBC: Recent Labs  Lab 07/21/20 0900 07/22/20 0337 07/23/20 0458 07/24/20 0357 07/25/20 0315  WBC 7.4 7.7 9.0 9.2 8.9  NEUTROABS 5.1 5.5 6.0 5.5 5.7  HGB 11.0* 9.3* 9.9* 9.8* 9.7*  HCT 33.3* 28.1* 30.0* 29.4* 29.6*  MCV 86.7 87.5 88.2 88.3 89.7  PLT 9* 54* 34* 32* 37*   Basic Metabolic Panel: Recent Labs  Lab 07/21/20 0900 07/22/20 0337 07/23/20 0458 07/24/20 0357 07/25/20 0315  NA 143 139 145 145 144  K 3.1* 3.5 3.4* 3.5 3.5  CL 109 106 110 110 106  CO2 25 24 24 26 26   GLUCOSE 138* 116* 106* 73 76  BUN 48* 45* 41* 39* 37*  CREATININE 2.17* 1.99* 1.77* 1.99* 2.08*  CALCIUM 7.7* 7.3* 7.5* 7.5* 7.4*   GFR: Estimated Creatinine Clearance: 27.9 mL/min (A) (by C-G formula based on SCr of 2.08 mg/dL (H)). Liver Function Tests: No results for input(s): AST, ALT, ALKPHOS, BILITOT, PROT, ALBUMIN in the last 168 hours. No results for input(s): LIPASE, AMYLASE in the last 168 hours. No results for input(s): AMMONIA in the last 168 hours. Coagulation Profile: No results for input(s): INR, PROTIME in the last 168 hours. Cardiac Enzymes: No results for input(s): CKTOTAL, CKMB, CKMBINDEX, TROPONINI in the last 168 hours. BNP (last 3 results) No results for input(s): PROBNP in the last 8760 hours. HbA1C: No results for input(s): HGBA1C in the last 72 hours. CBG: Recent Labs  Lab 07/23/20 1613 07/24/20 0755 07/24/20 1209 07/24/20 1639 07/25/20 0719  GLUCAP 102* 80 123* 97 75   Lipid Profile: No results for input(s): CHOL, HDL, LDLCALC,  TRIG, CHOLHDL, LDLDIRECT in the last 72 hours. Thyroid Function Tests: No results for input(s): TSH, T4TOTAL, FREET4, T3FREE, THYROIDAB in the last 72 hours. Anemia Panel: No results for input(s): VITAMINB12, FOLATE, FERRITIN, TIBC, IRON, RETICCTPCT in the last 72 hours.    Radiology Studies: I have reviewed all of the imaging during this hospital visit personally     Scheduled Meds: . Chlorhexidine Gluconate Cloth  6 each Topical Daily  . [START ON 07/26/2020] dexamethasone  2 mg Oral Daily  . dexamethasone  4 mg Oral Daily  . ferrous sulfate  325 mg Oral BID WC  . folic acid  1 mg Oral Daily  . furosemide  40 mg Oral Daily  . insulin aspart  0-9 Units Subcutaneous TID WC  . mouth rinse  15 mL Mouth Rinse BID  . pantoprazole  40 mg Oral BID  . polyethylene glycol  17 g Oral BID  . potassium chloride  40 mEq Oral Daily  . romiPLOStim  1 mcg/kg Subcutaneous Weekly  . sodium chloride flush  10-40 mL Intracatheter Q12H   Continuous Infusions:   LOS: 24 days        Kinsley Holderman Gerome Apley, MD

## 2020-07-25 NOTE — TOC Progression Note (Signed)
Transition of Care [facility]) - Progression Note    Patient Details  Name: Jacora Hopkins MRN: [id number] Date of Birth: 1960-04-23  [facility]) CM/SW Contact  Tawnia Schirm, Juliann Pulse, RN Phone Number: 07/25/2020, 4:14 PM  Clinical Narrative: Damaris Schooner to [place] office is still reviewing case if can accept-it may take 1 week for outcome. Spoke to patient/her niece-Amanda/friend-William-explained that patient should participate with PT/OT to be able to reach goals & determine the best d/c plan. Currently PT-need patient to participate/OT-HHOT. Per niece Amanda-patient's home which is a Hotel is not a safe place for patient to return(the relax [place]). Explained to niece if PCS needed can rrange with [facility] to start process-Oncologist Dr. [name](currently follows patient).MD updated.     Expected Discharge Plan: Skilled Nursing Facility Barriers to Discharge: Insurance Authorization  Expected Discharge Plan and Services Expected Discharge Plan: [place] In-house Referral: Clinical Social Work Discharge Planning Services: CM Consult Post Acute Care Choice: Durable Medical Equipment Living arrangements for the past 2 months: Single Family Home                 DME Arranged: Walker rolling with seat DME Agency: [employer] Date DME Agency Contacted: 07/17/20 Time DME Agency Contacted: [id number] Representative spoke with at DME Agency: [place] ([place]) Interventions    Readmission Risk Interventions Readmission Risk Prevention Plan 07/17/2020 04/11/2020  Transportation Screening Complete Complete  PCP or Specialist Appt within 3-5 Days - Complete  HRI or [place] Complete Complete  Social Work Consult for [place] Planning/Counseling Complete Complete  Palliative Care Screening Not Applicable Not Applicable  Medication Review Press photographer) Complete Complete  Some recent data might  be hidden

## 2020-07-26 ENCOUNTER — Telehealth: Payer: Self-pay

## 2020-07-26 ENCOUNTER — Other Ambulatory Visit (HOSPITAL_COMMUNITY): Payer: Self-pay

## 2020-07-26 DIAGNOSIS — E876 Hypokalemia: Secondary | ICD-10-CM | POA: Diagnosis not present

## 2020-07-26 DIAGNOSIS — T827XXD Infection and inflammatory reaction due to other cardiac and vascular devices, implants and grafts, subsequent encounter: Secondary | ICD-10-CM | POA: Diagnosis not present

## 2020-07-26 DIAGNOSIS — N179 Acute kidney failure, unspecified: Secondary | ICD-10-CM | POA: Diagnosis not present

## 2020-07-26 DIAGNOSIS — D649 Anemia, unspecified: Secondary | ICD-10-CM | POA: Diagnosis not present

## 2020-07-26 MED ORDER — TRAZODONE HCL 50 MG PO TABS
50.0000 mg | ORAL_TABLET | Freq: Every day | ORAL | Status: DC
  Administered 2020-07-26 – 2020-08-02 (×8): 50 mg via ORAL
  Filled 2020-07-26 (×8): qty 1

## 2020-07-26 MED ORDER — HEPARIN SOD (PORK) LOCK FLUSH 100 UNIT/ML IV SOLN
500.0000 [IU] | INTRAVENOUS | Status: AC | PRN
  Administered 2020-08-02: 500 [IU]
  Filled 2020-07-26: qty 5

## 2020-07-26 MED ORDER — POLYETHYLENE GLYCOL 3350 17 G PO PACK
17.0000 g | PACK | Freq: Two times a day (BID) | ORAL | 0 refills | Status: DC
  Filled 2020-07-26: qty 14, 7d supply, fill #0

## 2020-07-26 NOTE — Care Management (Signed)
    Durable Medical Equipment  (From admission, onward)         Start     Ordered   07/26/20 1047  For home use only DME Hospital bed  Once       Question Answer Comment  Length of Need 6 Months   The above medical condition requires: Patient requires the ability to reposition frequently   Head must be elevated greater than: 45 degrees   Bed type Semi-electric   Support Surface: Gel Overlay      07/26/20 1046   07/17/20 1533  For home use only DME Walker rolling  Once       Comments: Rollator  Question Answer Comment  Walker: With 5 Inch Wheels   Patient needs a walker to treat with the following condition Weakness      07/17/20 1533

## 2020-07-26 NOTE — Telephone Encounter (Signed)
Juliann Pulse, Hospital Case Manager called advising they are initiating a personal care form and requesting it to be expedited. The family members are wanting Dr. Julien Nordmann to be the attending and indicating the pt does not have a PCP. I advised I would share this information with Dr. Julien Nordmann but could not guarantee he would be the attending.   Of note, the pt has been advised several times that she needs a PCP and to contact her insurance carrier to determine who that would be for her.

## 2020-07-26 NOTE — Discharge Summary (Addendum)
Physician Discharge Summary  Khylee Algeo CBS:[id number] DOB: 03/31/1960 DOA: 07/01/2020  PCP: Merryl Hacker, No  Admit date: 07/01/2020   Discharge date:  08/02/20  Admitted From: home.  Disposition:  Home.  Recommendations for Outpatient Follow-up and new medication changes:  Follow up with Primary Care in 7 to 10 days.  Unfortunately patient has declined to go to skilled nursing facility.  She is awake and alert, she knows clearly the consequences of being at home unsupervised, including falls, injury and including death.  She was able to explain me the reasons why that happened appropriately. At this point patient is competent to make her own decisions, a wheelchair will be ordered, continue home care.  Home Health: home care   Equipment/Devices:hospital bed    Discharge Condition: stable  CODE STATUS: full  Diet recommendation:  Heart healthy   Brief/Interim Summary: [name] was admitted to the hospital with a working diagnosis of acute kidney injury and metabolic acidosis, complicated by uncontrolled seizures and severe anemia/ pancytopenia.  60 year old female past medical history of stage IV lung adenocarcinoma who recently received 12 cycle of pemetrexed and pembrolizumab 3/29, who presented with epistaxis, pancytopenia and renal failure. In the emergency department she was noted to have a seizure.Her vital signs include blood pressure 101/71, pulse rate 113, temperature 89.8, respiratory rate 28, oxygen saturation 100%. Lungs were clear to auscultation bilaterally, heart S1-S2, present, rhythmic, soft abdomen, no lower extremity edema. Patient was fatigued, somnolent and hyporeactive.   Sodium 133, potassium 3.9, chloride 104, bicarb less than 7, glucose 190, BUN 91, creatinine 5.56, magnesium 2.1, lactic acid > 11. White cell count 0.7, hemoglobin 2.4, hematocrit 7.9, platelets less than 5.  SARS COVID-19 negative. Urinalysis 100 protein, specific gravity 1.008, > 50 red  cells, 11-20 white cells  Drug screen positive for cocaine. Head CT no acute changes, mild atrophy. Chest radiograph with hyperinflation, no infiltrates.  EKG 80 bpm, normal axis, QTC 517, sinus rhythm with PACs, poor R wave progression, concave ST elevation in V4-V6, large precordial T waves.  Patient received PRBC and platelet transfusion.  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 Her cell count stabilized.   With supportive medical therapy her kidney function improved.  Further work-up with RBC scan showed bleeding at the small bowel.  Gastroenterology recommended supportive noninvasive approach.  Prolonged hospitalization complicated by coag negative staph bacteremia, she received intravenous cefazolin with good toleration.   1. Pancytopenia, chemotherapy induced. Stage IV lung cancer.  Patient completed steroids for ITP.  Tolerating well oral iron supplementation for iron deficiency anemia, follow iron stores as outpatient.    2. AKI on CKD stage IV. Her renal function has been stable, continue close monitoring as outpatient.  Now off diuretics.  Her discharge creatinine is 2.0, BUN 37.  3. Small bowel bleeding. Tolerating po well,. Continue close monitoring  4. Seizures. Clinically controlled, resolved.   5. Right upper extremity phlebitis, superficial vein thrombosis in the cephalic vein. Coag negative staph bacteremia,  Clinically resolved,   6. HTN.  Continue with amlodipine for blood pressure control.    7. Hyperglycemia, steroid induced she has completed steroids during her hospitalization.  8. Sepsis :She initially presented with tachycardia, tachypnea, elevated lactic acid, Hypothermia, lactic acidosis could be because of seizures.  Her hospitalization course was complicated by coagulase-negative staph bacteremia.  She completed antibiotics course.   Discharge Diagnoses:  Principal Problem:   Acute on chronic  anemia Active Problems:   Malignant neoplasm of right  lung (HCC)   Hypokalemia   Gastrointestinal hemorrhage   Pancytopenia (HCC)   Renal failure   AKI (acute kidney injury) ([place])   Acute blood loss anemia   Bacteremia associated with intravascular line [facility])    Discharge Instructions  Discharge Instructions    Diet - low sodium heart healthy   Complete by: As directed    Diet Carb Modified   Complete by: As directed    Increase activity slowly   Complete by: As directed      Allergies as of 08/02/2020      Reactions   Aspirin Adult Low [aspirin] Other (See Comments)   Stomach upset      Medication List    TAKE these medications   acetaminophen 325 MG tablet Commonly known as: TYLENOL Take 2 tablets (650 mg total) by mouth every 6 (six) hours as needed for mild pain, moderate pain or headache.   FeroSul 325 (65 FE) MG tablet Generic drug: ferrous sulfate TAKE 1 TABLET (325 MG TOTAL) BY MOUTH 2 (TWO) TIMES DAILY WITH A MEAL. What changed:   how much to take  how to take this  when to take this   folic acid 1 MG tablet Commonly known as: FOLVITE TAKE 1 TABLET (1 MG TOTAL) BY MOUTH DAILY. What changed: how much to take   gabapentin 300 MG capsule Commonly known as: NEURONTIN Take 1 capsule (300 mg total) by mouth at bedtime.   lidocaine-prilocaine cream Commonly known as: EMLA Apply 1 application topically as needed. What changed: reasons to take this   pantoprazole 40 MG tablet Commonly known as: PROTONIX TAKE 1 TABLET (40 MG TOTAL) BY MOUTH 2 (TWO) TIMES DAILY. What changed: how much to take   polyethylene glycol 17 g packet Commonly known as: MIRALAX / GLYCOLAX Dissolve 1 packet (17 g) in liquid and drink 2 (two) times daily.       Follow-up Information    Curt Bears, MD Follow up in 1 week(s).   Specialty: Oncology Contact information: [address] [place] [address] [phone]              Allergies   Allergen Reactions  . Aspirin Adult Low [Aspirin] Other (See Comments)    Stomach upset    Consultations:  Id  GI   Palliative care    Procedures/Studies: DG CHEST PORT 1 VIEW  Result Date: 07/07/2020 CLINICAL DATA:  History of lung carcinoma with weakness EXAM: PORTABLE CHEST 1 VIEW COMPARISON:  07/01/2020 FINDINGS: Cardiac shadow is stable. Right-sided chest wall port is again noted and stable. New bilateral pleural effusions are noted left greater than right with likely underlying atelectasis. No acute bony abnormality is seen. IMPRESSION: New bilateral pleural effusions left greater than right. Electronically Signed   By: Inez Catalina M.D.   On: 07/07/2020 20:13   VAS [place] UPPER EXTREMITY VENOUS DUPLEX  Result Date: 07/21/2020 UPPER VENOUS STUDY  Patient Name:  AERICA RINCON  Date of Exam:   07/21/2020 Medical Rec #: [id number]       Accession #:    [id number] Date of Birth: [birth year]       Patient Gender: F Patient Age:   59Y Exam Location:  [facility] Procedure:      VAS [place] UPPER EXTREMITY VENOUS DUPLEX Referring Phys: [id number] [place] --------------------------------------------------------------------------------  Indications: Edema Comparison Study: no prior Performing Technologist: Abram Sander RVS  Examination Guidelines: A complete evaluation includes B-mode imaging, spectral Doppler, color Doppler, and power  Doppler as needed of all accessible portions of each vessel. Bilateral testing is considered an integral part of a complete examination. Limited examinations for reoccurring indications may be performed as noted.  Right Findings: +----------+------------+---------+-----------+----------+-------+ RIGHT     CompressiblePhasicitySpontaneousPropertiesSummary +----------+------------+---------+-----------+----------+-------+ IJV           Full       Yes       Yes                      +----------+------------+---------+-----------+----------+-------+  Subclavian    Full       Yes       Yes                      +----------+------------+---------+-----------+----------+-------+ Axillary      Full       Yes       Yes                      +----------+------------+---------+-----------+----------+-------+ Brachial      Full       Yes       Yes                      +----------+------------+---------+-----------+----------+-------+ Radial        Full                                          +----------+------------+---------+-----------+----------+-------+ Ulnar         Full                                          +----------+------------+---------+-----------+----------+-------+ Cephalic      Full                                          +----------+------------+---------+-----------+----------+-------+ Basilic       Full                                          +----------+------------+---------+-----------+----------+-------+  Left Findings: +----------+------------+---------+-----------+----------+-----------------+ LEFT      CompressiblePhasicitySpontaneousProperties     Summary      +----------+------------+---------+-----------+----------+-----------------+ IJV           Full       Yes       Yes                                +----------+------------+---------+-----------+----------+-----------------+ Subclavian    Full       Yes       Yes                                +----------+------------+---------+-----------+----------+-----------------+ Axillary      Full       Yes       Yes                                +----------+------------+---------+-----------+----------+-----------------+  Brachial      Full       Yes       Yes                                +----------+------------+---------+-----------+----------+-----------------+ Radial        Full                                                     +----------+------------+---------+-----------+----------+-----------------+ Ulnar         Full                                                    +----------+------------+---------+-----------+----------+-----------------+ Cephalic      None                                  Age Indeterminate +----------+------------+---------+-----------+----------+-----------------+ Basilic       Full                                                    +----------+------------+---------+-----------+----------+-----------------+  Summary:  Right: No evidence of deep vein thrombosis in the upper extremity. No evidence of superficial vein thrombosis in the upper extremity.  Left: Findings consistent with age indeterminate superficial vein thrombosis involving the left cephalic vein.  *See table(s) above for measurements and observations.  Diagnosing physician: Harold Barban MD Electronically signed by Harold Barban MD on 07/21/2020 at 4:26:43 PM.    Final       Subjective: Patient is feeling better, no nausea or vomiting, no chest pain or dyspnea, continue to be weak and deconditioned, but feeling comfortable in going home.   Discharge Exam: Vitals:   08/02/20 1242 08/02/20 2002  BP: 136/84 (!) 142/82  Pulse: (!) 108 (!) 111  Resp: 17 18  Temp: 99.1 F (37.3 C) 99.1 F (37.3 C)  SpO2: 97% 94%   Vitals:   08/01/20 2049 08/02/20 0510 08/02/20 1242 08/02/20 2002  BP: 131/80 126/78 136/84 (!) 142/82  Pulse: (!) 110 (!) 106 (!) 108 (!) 111  Resp: 20 16 17 18   Temp: 99.2 F (37.3 C) 99.1 F (37.3 C) 99.1 F (37.3 C) 99.1 F (37.3 C)  TempSrc: Oral Oral Oral Oral  SpO2: 91% 92% 97% 94%  Weight:      Height:        General: Not in pain or dyspnea,  Neurology: Awake and alert, non focal  E ENT: mild pallor, no icterus, oral mucosa moist Cardiovascular: No JVD. S1-S2 present, rhythmic, no gallops, rubs, or murmurs. No lower extremity edema. Pulmonary: positive breath sounds bilaterally,  adequate air movement, no wheezing, rhonchi or rales. Gastrointestinal. Abdomen soft and non tender Skin. No rashes Musculoskeletal: no joint deformities   The results of significant diagnostics from this hospitalization (including imaging, microbiology, ancillary and laboratory) are listed below for reference.     Microbiology: No results found for this or any previous  visit (from the past 240 hour(s)).   Labs: BNP (last 3 results) No results for input(s): BNP in the last 8760 hours. Basic Metabolic Panel: Recent Labs  Lab 08/01/20 1109 08/02/20 0532  NA 140 141  K 2.9* 3.8  CL 106 109  CO2 26 25  GLUCOSE 99 104*  BUN 21* 19  CREATININE 2.52* 2.33*  CALCIUM 7.2* 7.6*   Liver Function Tests: No results for input(s): AST, ALT, ALKPHOS, BILITOT, PROT, ALBUMIN in the last 168 hours. No results for input(s): LIPASE, AMYLASE in the last 168 hours. No results for input(s): AMMONIA in the last 168 hours. CBC: Recent Labs  Lab 08/01/20 1109 08/02/20 0532 08/02/20 1032  WBC 5.6 5.0  --   HGB 8.1* 7.7* 7.8*  HCT 24.9* 24.5* 24.7*  MCV 90.9 93.2  --   PLT 78* 81*  --    Cardiac Enzymes: No results for input(s): CKTOTAL, CKMB, CKMBINDEX, TROPONINI in the last 168 hours. BNP: Invalid input(s): POCBNP CBG: No results for input(s): GLUCAP in the last 168 hours. D-Dimer No results for input(s): DDIMER in the last 72 hours. Hgb A1c No results for input(s): HGBA1C in the last 72 hours. Lipid Profile No results for input(s): CHOL, HDL, LDLCALC, TRIG, CHOLHDL, LDLDIRECT in the last 72 hours. Thyroid function studies No results for input(s): TSH, T4TOTAL, T3FREE, THYROIDAB in the last 72 hours.  Invalid input(s): FREET3 Anemia work up No results for input(s): VITAMINB12, FOLATE, FERRITIN, TIBC, IRON, RETICCTPCT in the last 72 hours. Urinalysis    Component Value Date/Time   COLORURINE STRAW (A) 07/01/2020 1416   APPEARANCEUR CLOUDY (A) 07/01/2020 1416   LABSPEC 1.008  07/01/2020 1416   PHURINE 5.0 07/01/2020 1416   GLUCOSEU 50 (A) 07/01/2020 1416   HGBUR LARGE (A) 07/01/2020 1416   BILIRUBINUR NEGATIVE 07/01/2020 1416   KETONESUR NEGATIVE 07/01/2020 1416   PROTEINUR 100 (A) 07/01/2020 1416   UROBILINOGEN 0.2 05/25/2012 1958   NITRITE NEGATIVE 07/01/2020 1416   LEUKOCYTESUR NEGATIVE 07/01/2020 1416   Sepsis Labs Invalid input(s): PROCALCITONIN,  WBC,  LACTICIDVEN Microbiology No results found for this or any previous visit (from the past 240 hour(s)).   Time coordinating discharge: 45 minutes  SIGNED:   Shawna Clamp, MD  [facility] 08/05/2020, 4:06 PM

## 2020-07-26 NOTE — Progress Notes (Signed)
   07/25/20 1937  Vitals  Temp 98 F (36.7 C)  Temp Source Oral  BP (!) 163/95  MAP (mmHg) 115  BP Location Left Arm  BP Method Automatic  Patient Position (if appropriate) Lying  Pulse Rate 98  Pulse Rate Source Monitor  Resp 20  MEWS COLOR  MEWS Score Color Green  Oxygen Therapy  SpO2 97 %  O2 Device Room Air  MEWS Score  MEWS Temp 0  MEWS Systolic 0  MEWS Pulse 0  MEWS RR 0  MEWS LOC 0  MEWS Score 0  Provider Notification  Provider Name/Title X.Blount  Date Provider Notified 07/25/20  Time Provider Notified 2000  Notification Type Page  Notification Reason Other (Comment) (Pt. c/o chest pain. Performed EKG.)  Provider response No new orders

## 2020-07-26 NOTE — Progress Notes (Signed)
Port-a-cath deaccess order placed.  Pt states she doesn't want port deaccessed until she is being discharged.  RN notified.

## 2020-07-26 NOTE — TOC Progression Note (Addendum)
Transition of Care [facility]) - Progression Note    Patient Details  Name: Brenda [name] MRN: [id number] Date of Birth: 12-21-1960  Transition of Care [facility]) CM/SW Contact  Leontine Radman, Juliann Pulse, RN Phone Number: 07/26/2020, 9:21 AM  Clinical Narrative:Spoke to patient who is A+0x3 about d/c plan-she adamantly wants to go home-she states she can get her rehab at home with family, who will visit  help, while waiting for the facility to respond. Informed her of the past PT/OT notes of non participating-she just wants to go home.Asked if her hotel where she stays is safe, & livable-she says yes, asked if she will have transportation for home-she says she will find out-offered PTAR if needed. Informed can start process for Personal care services expidited through [facility] I agreed-she agrees to use Dr. Earlie Server as pcp-informed that [facility] services can be initiated but she must be d/c, & they will respond within 72hrs of d/c.She also states she will update her family.Will update MD. 3p-Patient confirmed her address-[place] [id number] rep Thedore Mins aware of hospital bed order-to make arrangements for delivery prior d/c-hospital bed must be at home prior patient d/c. Patient wants William(friend) [phone] to be the contact person for [facility] service, & dme arrangements. PCS Expidited form has been faxed with confirmation to [facility]-attending Dr. Cathlean Sauer on the form as practitioner since per Dr. Worthy Flank office-not available to be the pcp, also has in past informed patient to get own pcp-he is the oncologist. Continue to monitor for d/c. May need PTAR @ d/c.    Expected Discharge Plan: Home/Self Care Barriers to Discharge: No Barriers Identified  Expected Discharge Plan and Services Expected Discharge Plan: Home/Self Care In-house Referral: Clinical Social Work Discharge Planning Services: CM Consult Post Acute Care Choice: Durable Medical Equipment Living arrangements for the  past 2 months: Single Family Home                 DME Arranged: Walker rolling with seat DME Agency: [employer] Date DME Agency Contacted: 07/17/20 Time DME Agency Contacted: [phone] Representative spoke with at DME Agency: [place] (Rafael Hernandez) Interventions    Readmission Risk Interventions Readmission Risk Prevention Plan 07/17/2020 04/11/2020  Transportation Screening Complete Complete  PCP or Specialist Appt within 3-5 Days - Complete  HRI or [place] Complete Complete  Social Work Consult for [place] Planning/Counseling Complete Complete  Palliative Care Screening Not Applicable Not Applicable  Medication Review Press photographer) Complete Complete  Some recent data might be hidden

## 2020-07-27 DIAGNOSIS — E876 Hypokalemia: Secondary | ICD-10-CM | POA: Diagnosis not present

## 2020-07-27 DIAGNOSIS — N179 Acute kidney failure, unspecified: Secondary | ICD-10-CM | POA: Diagnosis not present

## 2020-07-27 DIAGNOSIS — T827XXD Infection and inflammatory reaction due to other cardiac and vascular devices, implants and grafts, subsequent encounter: Secondary | ICD-10-CM | POA: Diagnosis not present

## 2020-07-27 DIAGNOSIS — D649 Anemia, unspecified: Secondary | ICD-10-CM | POA: Diagnosis not present

## 2020-07-27 MED ORDER — BISACODYL 5 MG PO TBEC
5.0000 mg | DELAYED_RELEASE_TABLET | Freq: Once | ORAL | Status: AC
  Administered 2020-07-27: 5 mg via ORAL
  Filled 2020-07-27: qty 1

## 2020-07-27 NOTE — TOC Progression Note (Signed)
Transition of Care [facility]) - Progression Note    Patient Details  Name: Brenda [name] MRN: [id number] Date of Birth: 04/06/1960  Transition [facility]) CM/SW Contact  Purcell Mouton, RN Phone Number: 07/27/2020, 10:18 AM  Clinical Narrative:     Spoke with pt's niece concerning pt going to her brother's home. Niece [name] states that pt's brother live in a one bedroom apartment. Niece asked if pt could go to [facility]. Spoke with pt who is alert and oriented x3. When asked the pt is her niece was her HCOPA , pt states, "No, my son is. I don't know how you all stated talking to my niece."  Asked pt if she wanted hospital staff to continue to talk with her niece. Pt states, "TALK TO ME ONLY."  Pt continued to state that she is going back to hotel. She is waiting on a call to see if she can have a hospital bed there.   Expected Discharge Plan: Home/Self Care Barriers to Discharge: No Barriers Identified  Expected Discharge Plan and Services Expected Discharge Plan: Home/Self Care In-house Referral: Clinical Social Work Discharge Planning Services: CM Consult Post Acute Care Choice: Durable Medical Equipment Living arrangements for the past 2 months: Single Family Home                 DME Arranged: Walker rolling with seat DME Agency: [employer] Date DME Agency Contacted: 07/17/20 Time DME Agency Contacted: [phone] Representative spoke with at DME Agency: [place] ([place]) Interventions    Readmission Risk Interventions Readmission Risk Prevention Plan 07/17/2020 04/11/2020  Transportation Screening Complete Complete  PCP or Specialist Appt within 3-5 Days - Complete  HRI or [place] Complete Complete  Social Work Consult for [place] Planning/Counseling Complete Complete  Palliative Care Screening Not Applicable Not Applicable  Medication Review Press photographer) Complete Complete  Some recent data might be  hidden

## 2020-07-27 NOTE — Progress Notes (Signed)
PROGRESS NOTE    Brenda [name]  SEG:[id number] DOB: 08/12/1960 DOA: 07/01/2020 PCP: Pcp, No    Brief Narrative:  Brenda [name] was admitted to the hospital with a working diagnosis of acute kidney injury and metabolic acidosis, complicated by uncontrolled seizures and severe anemia/ pancytopenia.  60 year old female past medical history of stage IV lung adenocarcinoma who recently received 12 cycle of pemetrexed and pembrolizumab 3/29, who presented with epistaxis, pancytopenia and renal failure. In the emergency department she was noted to have a seizure.Her vital signs include blood pressure 101/71, pulse rate 113, temperature 89.8, respiratory rate 28, oxygen saturation 100%. Lungswereclear to auscultation bilaterally, heart S1-S2, present, rhythmic, soft abdomen, no lower extremity edema. Patient was fatigued, somnolent and hyporeactive.   Sodium 133, potassium 3.9, chloride 104, bicarb less than 7, glucose 190, BUN 91, creatinine 5.56, magnesium 2.1, lactic acid > 11. White cell count 0.7, hemoglobin 2.4, hematocrit 7.9, platelets less than 5.  SARS COVID-19 negative. Urinalysis 100 protein, specific gravity 1.008, > 50 red cells, 11-20 white cells  Drug screen positive for cocaine. Head CT no acute changes, mild atrophy. Chest radiograph with hyperinflation, no infiltrates.  EKG 80 bpm, normal axis, QTC 517, sinus rhythm with PACs, poor R wave progression, concave ST elevation in V4-V6, large precordial T waves.  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 Her cell count stabilized.  With supportive medical therapy her kidney function improved. Further work-up with RBC scan showed bleeding at the small bowel. Gastroenterology recommended supportive noninvasive approach.  Prolonged hospitalization complicated by coag negative staph bacteremia,she received intravenous cefazolin with good  toleration.   Assessment & Plan:   Principal Problem:   Acute on chronic anemia Active Problems:   Malignant neoplasm of right lung (HCC)   Hypokalemia   Gastrointestinal hemorrhage   Pancytopenia (HCC)   Renal failure   AKI (acute kidney injury) ([place])   Acute blood loss anemia   Bacteremia associated with intravascular line ([place])    1. Pancytopenia, chemotherapy induced. Stage IV lung cancer. Continue with steroid taperfor ITP.  Tolerating well oraliron supplementation for iron deficiency anemia, follow iron stores as outpatient.  Patient will follow up with oncology as outpatient, likely only candidate for immunotherapy. Patient would like to continue with this plan and not look for hospice at this point in time.   Iron deficiency anemia, continue with oral iron.   2. [name] CKD stage IV. tolerating po well, renal function has been stabilized Continue to hold on diuretics.  3. Small bowel bleeding.Tolerating po well,. Continue close monitoring  4. Seizures.Clinically controlled, resolved.  5. Right upper extremity phlebitis, superficial vein thrombosis in the cephalic vein. Coag negative staph bacteremia,  Clinically resolved,  6. HTN.On amlodipinefor blood pressure control.  7.Hyperglycemia, steroid induced steroid taper   Status is: Inpatient  Remains inpatient appropriate because: pending home equipment.    Dispo:  Patient From: Home  Planned Disposition: Home  Medically stable for discharge: yes. At this point waiting for hospital bed to reach her home. She does not want to go to her brother's home. She has been not accepted to SNF .      DVT prophylaxis: Enoxaparin   Code Status:   full  Family Communication:  I spoke over the phone with the patient's nice about patient's  condition, plan of care, prognosis and all questions were addressed.    Consultants:   Oncology     Subjective: Patient continue to be  very weak and  deconditioned, she has no nausea or vomiting, no chest pain or dyspnea. Willing to go home.   Objective: Vitals:   07/26/20 1226 07/26/20 1945 07/27/20 0621 07/27/20 0918  BP: (!) 156/99 (!) 144/98 (!) 143/98 136/85  Pulse: (!) 106 (!) 109 (!) 101 99  Resp: 20 20 18 16   Temp: 97.8 F (36.6 C) 98.1 F (36.7 C) 98.5 F (36.9 C)   TempSrc: Oral Oral Oral   SpO2: 100% 98% 100% 93%  Weight:      Height:        Intake/Output Summary (Last 24 hours) at 07/27/2020 1237 Last data filed at 07/27/2020 0973 Gross per 24 hour  Intake 480 ml  Output 1000 ml  Net -520 ml   Filed Weights   07/24/20 0626 07/25/20 0344 07/26/20 0413  Weight: 71.5 kg 69.2 kg 65.9 kg    Examination:   General: Not in pain or dyspnea,. Deconditioned  Neurology: Awake and alert, non focal  E ENT: mild pallor, no icterus, oral mucosa moist Cardiovascular: No JVD. S1-S2 present, rhythmic, no gallops, rubs, or murmurs. No lower extremity edema. Pulmonary: positive breath sounds bilaterally, adequate air movement, no wheezing, rhonchi or rales. Gastrointestinal. Abdomen soft and non tender Skin. No rashes Musculoskeletal: no joint deformities     Data Reviewed: I have personally reviewed following labs and imaging studies  CBC: Recent Labs  Lab 07/21/20 0900 07/22/20 0337 07/23/20 0458 07/24/20 0357 07/25/20 0315  WBC 7.4 7.7 9.0 9.2 8.9  NEUTROABS 5.1 5.5 6.0 5.5 5.7  HGB 11.0* 9.3* 9.9* 9.8* 9.7*  HCT 33.3* 28.1* 30.0* 29.4* 29.6*  MCV 86.7 87.5 88.2 88.3 89.7  PLT 9* 54* 34* 32* 37*   Basic Metabolic Panel: Recent Labs  Lab 07/21/20 0900 07/22/20 0337 07/23/20 0458 07/24/20 0357 07/25/20 0315  NA 143 139 145 145 144  K 3.1* 3.5 3.4* 3.5 3.5  CL 109 106 110 110 106  CO2 25 24 24 26 26   GLUCOSE 138* 116* 106* 73 76  BUN 48* 45* 41* 39* 37*  CREATININE 2.17* 1.99* 1.77* 1.99* 2.08*  CALCIUM 7.7* 7.3* 7.5* 7.5* 7.4*   GFR: Estimated Creatinine Clearance: 27.9 mL/min (A) (by C-G  formula based on SCr of 2.08 mg/dL (H)). Liver Function Tests: No results for input(s): AST, ALT, ALKPHOS, BILITOT, PROT, ALBUMIN in the last 168 hours. No results for input(s): LIPASE, AMYLASE in the last 168 hours. No results for input(s): AMMONIA in the last 168 hours. Coagulation Profile: No results for input(s): INR, PROTIME in the last 168 hours. Cardiac Enzymes: No results for input(s): CKTOTAL, CKMB, CKMBINDEX, TROPONINI in the last 168 hours. BNP (last 3 results) No results for input(s): PROBNP in the last 8760 hours. HbA1C: No results for input(s): HGBA1C in the last 72 hours. CBG: Recent Labs  Lab 07/24/20 0755 07/24/20 1209 07/24/20 1639 07/25/20 0719 07/25/20 1122  GLUCAP 80 123* 97 75 83   Lipid Profile: No results for input(s): CHOL, HDL, LDLCALC, TRIG, CHOLHDL, LDLDIRECT in the last 72 hours. Thyroid Function Tests: No results for input(s): TSH, T4TOTAL, FREET4, T3FREE, THYROIDAB in the last 72 hours. Anemia Panel: No results for input(s): VITAMINB12, FOLATE, FERRITIN, TIBC, IRON, RETICCTPCT in the last 72 hours.    Radiology Studies: I have reviewed all of the imaging during this hospital visit personally     Scheduled Meds: . Chlorhexidine Gluconate Cloth  6 each Topical Daily  . dexamethasone  2 mg Oral Daily  . ferrous sulfate  325 mg Oral BID WC  . folic acid  1 mg Oral Daily  . mouth rinse  15 mL Mouth Rinse BID  . pantoprazole  40 mg Oral BID  . polyethylene glycol  17 g Oral BID  . romiPLOStim  1 mcg/kg Subcutaneous Weekly  . sodium chloride flush  10-40 mL Intracatheter Q12H  . traZODone  50 mg Oral QHS   Continuous Infusions:   LOS: 26 days        Keilyn Haggard Gerome Apley, MD

## 2020-07-28 DIAGNOSIS — T827XXD Infection and inflammatory reaction due to other cardiac and vascular devices, implants and grafts, subsequent encounter: Secondary | ICD-10-CM | POA: Diagnosis not present

## 2020-07-28 DIAGNOSIS — C3491 Malignant neoplasm of unspecified part of right bronchus or lung: Secondary | ICD-10-CM | POA: Diagnosis not present

## 2020-07-28 DIAGNOSIS — D62 Acute posthemorrhagic anemia: Secondary | ICD-10-CM | POA: Diagnosis not present

## 2020-07-28 DIAGNOSIS — D649 Anemia, unspecified: Secondary | ICD-10-CM | POA: Diagnosis not present

## 2020-07-28 NOTE — Plan of Care (Signed)
  Problem: Clinical Measurements: Goal: Will remain free from infection Outcome: Progressing Goal: Cardiovascular complication will be avoided Outcome: Progressing   Problem: Education: Goal: Knowledge of General Education information will improve Description: Including pain rating scale, medication(s)/side effects and non-pharmacologic comfort measures Outcome: Not Progressing   Problem: Activity: Goal: Risk for activity intolerance will decrease Outcome: Not Progressing

## 2020-07-28 NOTE — Progress Notes (Signed)
PROGRESS NOTE    Brenda [name]  PPI:[id number] DOB: 08/18/1960 DOA: 07/01/2020 PCP: Pcp, No    Brief Narrative:  Brenda [name] was admitted to the hospital with a working diagnosis of acute kidney injury and metabolic acidosis, complicated by uncontrolled seizures and severe anemia/ pancytopenia.  60 year old female past medical history of stage IV lung adenocarcinoma who recently received 12 cycle of pemetrexed and pembrolizumab 3/29, who presented with epistaxis, pancytopenia and renal failure. In the emergency department she was noted to have a seizure.Her vital signs include blood pressure 101/71, pulse rate 113, temperature 89.8, respiratory rate 28, oxygen saturation 100%. Lungswereclear to auscultation bilaterally, heart S1-S2, present, rhythmic, soft abdomen, no lower extremity edema. Patient was fatigued, somnolent and hyporeactive.  Sodium 133, potassium 3.9, chloride 104, bicarb less than 7, glucose 190, BUN 91, creatinine 5.56, magnesium 2.1, lactic acid>11. White cell count 0.7, hemoglobin 2.4, hematocrit 7.9, platelets less than 5. SARS COVID-19 negative. Urinalysis 100 protein, specific gravity 1.008,> 50red cells, 11-20 white cells  Drug screen positive for cocaine. Head CT no acute changes, mild atrophy. Chest radiograph with hyperinflation, no infiltrates.  EKG 80 bpm, normal axis, QTC 517, sinus rhythm with PACs, poor R wave progression, concave ST elevation in V4-V6, large precordial T waves.  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 Her cell count stabilized.  With supportive medical therapy her kidney function improved. Further work-up with RBC scan showed bleeding at the small bowel. Gastroenterology recommended supportive noninvasive approach.  Prolonged hospitalization, complicated by coag negative staph bacteremia,she received intravenous cefazolin with good  toleration.  Currently patient's home discharge is not safe, no family support and patient is not mobile, lives in a [place]. So far not able to place at [facility].   Assessment & Plan:   Principal Problem:   Acute on chronic anemia Active Problems:   Malignant neoplasm of right lung (HCC)   Hypokalemia   Gastrointestinal hemorrhage   Pancytopenia (HCC)   Renal failure   AKI (acute kidney injury) ([place])   Acute blood loss anemia   Bacteremia associated with intravascular line ([place])    1. Pancytopenia, chemotherapy induced. Stage IV lung cancer. Now completed steroids for ITP.  Continue withoraliron supplementation for iron deficiency anemia.  Follow up with oncology as outpatient, likely only candidate for immunotherapy. Patient not candidate for residential hospice.   For Iron deficiency anemia, continue with oral iron.  Will encourage to continue PT and OT, needs to improve mobility in order to go home safely.  Continue pain control with tramadol.   2. Brenda [name] CKD stage IV.stable renal function, patient has been tolerating po well.   3. Small bowel bleeding.no signs of recurrence, no nausea or vomiting,. Continue with as needed loperamide.   4. Seizures.Clinically controlled, resolved.  5. Right upper extremity phlebitis, superficial vein thrombosis in the cephalic vein. Coag negative staph bacteremia,  Clinically resolved,  6. HTN.Continue with amlodipine for blood pressure control.   7.Hyperglycemia, steroid inducedcontinue with steroid taper    Status is: Inpatient  Remains inpatient appropriate because:Unsafe d/c plan   Dispo:  Patient From: Home  Planned Disposition: Home  Medically stable for discharge: yes      DVT prophylaxis: Enoxaparin   Code Status:   full  Family Communication:  No family at the bedside    Subjective: Patient is awake and alert, continue to be very weak. She wants to go home but at this point no family support.  Objective: Vitals:   07/27/20 0918 07/27/20 1301 07/27/20 2040 07/28/20 0510  BP: 136/85 137/86 139/78 126/72  Pulse: 99 (!) 107 97 97  Resp: 16 20 18 18   Temp:  98.3 F (36.8 C) 98 F (36.7 C) 98.4 F (36.9 C)  TempSrc:  Oral Oral Oral  SpO2: 93% 95% 95% 95%  Weight:      Height:        Intake/Output Summary (Last 24 hours) at 07/28/2020 1242 Last data filed at 07/28/2020 1226 Gross per 24 hour  Intake 1400 ml  Output 2400 ml  Net -1000 ml   Filed Weights   07/24/20 0626 07/25/20 0344 07/26/20 0413  Weight: 71.5 kg 69.2 kg 65.9 kg    Examination:   General: Not in pain or dyspnea, deconditioned  Neurology: Awake and alert, non focal  E ENT: mild pallor, no icterus, oral mucosa moist Cardiovascular: No JVD. S1-S2 present, rhythmic, no gallops, rubs, or murmurs. No lower extremity edema. Pulmonary: positive breath sounds bilaterally,  with, no wheezing, rhonchi or rales. Gastrointestinal. Abdomen soft and non tender Skin. No rashes Musculoskeletal: no joint deformities     Data Reviewed: I have personally reviewed following labs and imaging studies  CBC: Recent Labs  Lab 07/22/20 0337 07/23/20 0458 07/24/20 0357 07/25/20 0315  WBC 7.7 9.0 9.2 8.9  NEUTROABS 5.5 6.0 5.5 5.7  HGB 9.3* 9.9* 9.8* 9.7*  HCT 28.1* 30.0* 29.4* 29.6*  MCV 87.5 88.2 88.3 89.7  PLT 54* 34* 32* 37*   Basic Metabolic Panel: Recent Labs  Lab 07/22/20 0337 07/23/20 0458 07/24/20 0357 07/25/20 0315  NA 139 145 145 144  K 3.5 3.4* 3.5 3.5  CL 106 110 110 106  CO2 24 24 26 26   GLUCOSE 116* 106* 73 76  BUN 45* 41* 39* 37*  CREATININE 1.99* 1.77* 1.99* 2.08*  CALCIUM 7.3* 7.5* 7.5* 7.4*   GFR: Estimated Creatinine Clearance: 27.9 mL/min (A) (by C-G formula based on SCr of 2.08 mg/dL (H)). Liver Function Tests: No results for input(s): AST, ALT, ALKPHOS, BILITOT, PROT, ALBUMIN in the last 168 hours. No results for input(s): LIPASE, AMYLASE in the last 168 hours. No results  for input(s): AMMONIA in the last 168 hours. Coagulation Profile: No results for input(s): INR, PROTIME in the last 168 hours. Cardiac Enzymes: No results for input(s): CKTOTAL, CKMB, CKMBINDEX, TROPONINI in the last 168 hours. BNP (last 3 results) No results for input(s): PROBNP in the last 8760 hours. HbA1C: No results for input(s): HGBA1C in the last 72 hours. CBG: Recent Labs  Lab 07/24/20 0755 07/24/20 1209 07/24/20 1639 07/25/20 0719 07/25/20 1122  GLUCAP 80 123* 97 75 83   Lipid Profile: No results for input(s): CHOL, HDL, LDLCALC, TRIG, CHOLHDL, LDLDIRECT in the last 72 hours. Thyroid Function Tests: No results for input(s): TSH, T4TOTAL, FREET4, T3FREE, THYROIDAB in the last 72 hours. Anemia Panel: No results for input(s): VITAMINB12, FOLATE, FERRITIN, TIBC, IRON, RETICCTPCT in the last 72 hours.    Radiology Studies: I have reviewed all of the imaging during this hospital visit personally     Scheduled Meds: . Chlorhexidine Gluconate Cloth  6 each Topical Daily  . ferrous sulfate  325 mg Oral BID WC  . folic acid  1 mg Oral Daily  . mouth rinse  15 mL Mouth Rinse BID  . pantoprazole  40 mg Oral BID  . polyethylene glycol  17 g Oral BID  . romiPLOStim  1 mcg/kg Subcutaneous Weekly  . sodium chloride  flush  10-40 mL Intracatheter Q12H  . traZODone  50 mg Oral QHS   Continuous Infusions:   LOS: 27 days        Montie Swiderski Gerome Apley, MD

## 2020-07-28 NOTE — TOC Progression Note (Signed)
Transition of Care [facility]) - Progression Note    Patient Details  Name: Brenda [name] MRN: [id number] Date of Birth: October 31, 1960  Transition of Care [facility]) CM/SW [place], [place] Phone Number: 07/28/2020, 12:39 PM  Clinical Narrative:     CSW spoke with Zack at [facility] and they are able to deliver hospital bed but they need confirmation the hotel can accommodate a hospital bed. CSW tried calling the hotel but the phone rang without and answer or sending me to voicemail.    Expected Discharge Plan: Home/Self Care Barriers to Discharge: No Barriers Identified  Expected Discharge Plan and Services Expected Discharge Plan: Home/Self Care In-house Referral: Clinical Social Work Discharge Planning Services: CM Consult Post Acute Care Choice: Durable Medical Equipment Living arrangements for the past 2 months: Single Family Home                 DME Arranged: Walker rolling with seat DME Agency: [employer] Date DME Agency Contacted: 07/17/20 Time DME Agency Contacted: [phone] Representative spoke with at DME Agency: [place] ([place]) Interventions    Readmission Risk Interventions Readmission Risk Prevention Plan 07/17/2020 04/11/2020  Transportation Screening Complete Complete  PCP or Specialist Appt within 3-5 Days - Complete  HRI or [place] Complete Complete  Social Work Consult for [place] Planning/Counseling Complete Complete  Palliative Care Screening Not Applicable Not Applicable  Medication Review Press photographer) Complete Complete  Some recent data might be hidden

## 2020-07-28 NOTE — TOC Progression Note (Addendum)
Transition of Care [facility]) - Progression Note    Patient Details  Name: Brenda [name] MRN: [id number] Date of Birth: [birth year]  Transition of Care [facility]) CM/SW Contact  Lennart Pall, LCSW Phone Number: 07/28/2020, 2:40 PM  Clinical Narrative:    Met with pt this morning to discuss dc planning issues.  Pt reports she still plans to dc back to her motel room at [employer].  Asking about the hospital bed and assistance she will have:  Pt reports that "the bed is going to my niece's house and then she will bring it over to me.  I have a person living right next door to me who is certified and is going to be doing my therapy."  Explained to pt that this information is concerning and confusing and asked if she would agree to me speaking directly with her niece - pt agrees.  Contacted pt's niece, Melody Haver, who reports that the motel will NOT allow pt to have a hospital bed in the room (and states that, no, she is not bringing one to her either).  She adds that the only person that is there to assist pt is her fiance, [name], who works 12 hours per day.  Niece continues to state that she does not feel it is a safe dc for pt to return to hotel. Reviewed niece' information with pt who now states that she knows she will not have a hospital bed. States that she has "another niece who will be with me 'til he [name]) gets home." Have explained to pt that we are recommending that pt have a confirmed 24/7 care plan in order to dc home and need someone to contact us or come to the hospital to confirm this care.  I am aware that an expedited request for [facility] aide is submitted, however, this could take several days - couple of weeks to actually have a person in the home to assist and will be limited to 3-4 hours per day.  Will continue with plan for SNF unless care in home can be solidified.  Have updated MD and RN.    Addendum (1600):  Pt's fiance, [name], arrived on the unit and I have reviewed dc  recommendations and concerns with him in the room with pt.   He confirms that he does work and that he cannot promise that there would be anyone who can be with pt during those hours.  He is aware of SNF recommendation and is in agreement with that "for a couple of weeks for rehab".  Pt increasingly agitated by the discussion and insists she will leave the hospital anyway.  [name] very calmly trying to discuss this with pt, however, pt remains agitated.  I have discussed with both that MD does not feel this is a safe dc unless she has 24/7 and, if she leaves AMA then I would be obligated to call APS and report concerns. I left the two in the room to discuss in hopes of de-escalating the pt.  After a few minutes, [name], had pt somewhat calmed and agreeable to stay if he brings her some crab legs.  At this point, TOC to continue to work on SNF.  Pt does report that her son may be coming to the hospital and that he might be able to work out another [employer] - we have no contact info for son.  Await son's arrival if this happens.  Expected Discharge Plan: Home/Self Care Barriers to Discharge: No Barriers  Identified  Expected Discharge Plan and Services Expected Discharge Plan: Home/Self Care In-house Referral: Clinical Social Work Discharge Planning Services: CM Consult Post Acute Care Choice: Durable Medical Equipment Living arrangements for the past 2 months: Single Family Home                 DME Arranged: Walker rolling with seat DME Agency: [employer] Date DME Agency Contacted: 07/17/20 Time DME Agency Contacted: [phone] Representative spoke with at DME Agency: [place] ([place]) Interventions    Readmission Risk Interventions Readmission Risk Prevention Plan 07/17/2020 04/11/2020  Transportation Screening Complete Complete  PCP or Specialist Appt within 3-5 Days - Complete  HRI or [place] Complete Complete  Social Work Consult for  [place] Planning/Counseling Complete Complete  Palliative Care Screening Not Applicable Not Applicable  Medication Review Press photographer) Complete Complete  Some recent data might be hidden

## 2020-07-29 DIAGNOSIS — N179 Acute kidney failure, unspecified: Secondary | ICD-10-CM | POA: Diagnosis not present

## 2020-07-29 DIAGNOSIS — T827XXD Infection and inflammatory reaction due to other cardiac and vascular devices, implants and grafts, subsequent encounter: Secondary | ICD-10-CM | POA: Diagnosis not present

## 2020-07-29 DIAGNOSIS — D649 Anemia, unspecified: Secondary | ICD-10-CM | POA: Diagnosis not present

## 2020-07-29 DIAGNOSIS — D61818 Other pancytopenia: Secondary | ICD-10-CM | POA: Diagnosis not present

## 2020-07-29 NOTE — TOC Progression Note (Addendum)
Transition of Care [facility]) - Progression Note    Patient Details  Name: Brenda [name] MRN: [id number] Date of Birth: [birth year]  Transition of Care [facility]) CM/SW Contact  Johnpatrick Jenny, Juliann Pulse, RN Phone Number: 07/29/2020, 11:41 AM  Clinical Narrative: [place] rep Debbie-will make hospital visit for acceptance paperwork-she will ask patient specific questions mainly if medicaid check can come to facility-will await outcome of acceptance. Nurse as witness to hear CM explain choices of d/c plan- [facility] agree to accept for SNF vs home-return back to [place] hotel that may or may be able to accomodate a hospital bed, & [facility] for custodial level care-not set up they have 72hrs business hours to set up which may or may not be in place prior to d/c. Informed that rehab does not take place in hospital once medically stable-rehab takes place at another level of care either SNF/LTACH/home-Patient voiced understanding. Asked patient if I could call family-William or [name] or anyone-she adamantly states no just talk to me.MD updated.    Expected Discharge Plan: Skilled Nursing Facility Barriers to Discharge: No Barriers Identified  Expected Discharge Plan and Services Expected Discharge Plan: [place] In-house Referral: Clinical Social Work Discharge Planning Services: CM Consult Post Acute Care Choice: Durable Medical Equipment Living arrangements for the past 2 months: Single Family Home                 DME Arranged: Walker rolling with seat DME Agency: [employer] Date DME Agency Contacted: 07/17/20 Time DME Agency Contacted: [phone] Representative spoke with at DME Agency: [place] ([place]) Interventions    Readmission Risk Interventions Readmission Risk Prevention Plan 07/17/2020 04/11/2020  Transportation Screening Complete Complete  PCP or Specialist Appt within  3-5 Days - Complete  HRI or [place] Complete Complete  Social Work Consult for [place] Planning/Counseling Complete Complete  Palliative Care Screening Not Applicable Not Applicable  Medication Review Press photographer) Complete Complete  Some recent data might be hidden

## 2020-07-29 NOTE — Plan of Care (Signed)
  Problem: Activity: Goal: Risk for activity intolerance will decrease Outcome: Progressing   

## 2020-07-29 NOTE — Progress Notes (Signed)
Patient sat on EOB and reports feeling "wobbly" Patient stood at bedside, ambulated 3 steps with 1 person assist. Patient reports she is unsteady and feels like her legs are giving out.. Education provided on fall risk. Patient states she would like to receive physical therapy while inpatient and is agreeable to discharging to SNF.

## 2020-07-29 NOTE — Progress Notes (Signed)
Physical Therapy Treatment Patient Details Name: Brenda [name] MRN: [id number] DOB: [birth year] Today's Date: 07/29/2020    History of Present Illness 60 years old female adm with Bacteremia associated with intravascular line, ARF, acute GI bleed, pancytopenia.   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 Julien Nordmann presented to the hospital with epistaxis, dark stools and generalized weakness for few days.  CXR showed Port-A-Cath in place and minimal right pleural effusion but otherwise clear.    PT Comments    Pt with improved tolerance to activity and motivation with therapy. Pt cued through BLE strengthening exercises, verbal cues for decreased momentum and increasing AROM. Pt requires mod A to stand up, VCs for quad/glute activation, ultimately requiring rocking momentum, VCs for hand placement and mod A to upright self into standing. Min A to pivot over to recliner at bedside. Pt able to perform standing marching with min A, declines further ambulation. Pt with mild SOB, SpO2 92% on RA and HR 109-115. Pt requires therapeutic rest breaks to recover after exercises and transfers. Pt appears surprised at difficulty with standing from EOB, "I can't believe I can't stand up". Encouraged pt to remain up in chair for at least 1 hour and educated on HEP while up; pt verbalized agreement. Will continue to progress acute PT as able.  Follow Up Recommendations  SNF     Equipment Recommendations  Rolling walker with 5" wheels;Wheelchair (measurements PT);Wheelchair cushion (measurements PT)    Recommendations for Other Services       Precautions / Restrictions Precautions Precautions: Fall Restrictions Weight Bearing Restrictions: No    Mobility  Bed Mobility Overal bed mobility: Needs Assistance Bed Mobility: Supine to Sit  Supine to sit: Min guard  General bed mobility comments: slow to mobilize to  EOB, min G for safety to upright trunk and clear BLE off EOB    Transfers Overall transfer level: Needs assistance Equipment used: Rolling walker (2 wheeled);1 person hand held assist Transfers: Sit to/from [facility] Sit to Stand: Mod assist Stand pivot transfers: Min assist    General transfer comment: mod A to power to stand, multimodal cues for hand placement and using rocking momentum to power up, min A to steady wtih pivot over to recliner with HHA and therapist positioned anterior to pt  Ambulation/Gait Ambulation/Gait assistance: Min assist  General Gait Details: pt able to march in place with RW, no knee buckling or LOB, declines ambulation   Stairs        Wheelchair Mobility    Modified Rankin (Stroke Patients Only)       Balance Overall balance assessment: Needs assistance Sitting-balance support: Feet supported Sitting balance-Leahy Scale: Good Sitting balance - Comments: seated EOB   Standing balance support: During functional activity;Bilateral upper extremity supported Standing balance-Leahy Scale: Poor Standing balance comment: reliant on UE support     Cognition Arousal/Alertness: Awake/alert Behavior During Therapy: WFL for tasks assessed/performed;Anxious Overall Cognitive Status: Within Functional Limits for tasks assessed  General Comments: Pt with poor insight of currenty functional status, frustrated she can't stand up and trying to pull on therapist's arms to upright self, multimodal cues for mobility and safety      Exercises General Exercises - Lower Extremity Ankle Circles/Pumps: Supine;AROM;Both;20 reps Short Arc Quad: Supine;AROM;Strengthening;Both;15 reps Long Arc Quad: Seated;AROM;Strengthening;Both;15 reps Hip Flexion/Marching: Seated;AROM;Strengthening;Both;15 reps Heel Raises: Seated;AROM;Strengthening;Both;15 reps    General Comments General comments (skin integrity, edema, etc.): Pt on RA with  SpO2 92%, HR  115-109 with mobility      Pertinent Vitals/Pain      Home Living                      Prior Function            PT Goals (current goals can now be found in the care plan section) Acute Rehab PT Goals PT Goal Formulation: With patient Time For Goal Achievement: 08/05/20 Potential to Achieve Goals: Fair Progress towards PT goals: Progressing toward goals    Frequency    Min 2X/week      PT Plan Current plan remains appropriate    Co-evaluation              AM-PAC PT "6 Clicks" Mobility   Outcome Measure  Help needed turning from your back to your side while in a flat bed without using bedrails?: A Little Help needed moving from lying on your back to sitting on the side of a flat bed without using bedrails?: A Little Help needed moving to and from a bed to a chair (including a wheelchair)?: A Lot Help needed standing up from a chair using your arms (e.g., wheelchair or bedside chair)?: A Lot Help needed to walk in hospital room?: A Lot Help needed climbing 3-5 steps with a railing? : Total 6 Click Score: 13    End of Session Equipment Utilized During Treatment: Gait belt Activity Tolerance: Patient limited by fatigue Patient left: in chair;with call bell/phone within reach;with chair alarm set Nurse Communication: Mobility status PT Visit Diagnosis: Muscle weakness (generalized) (M62.81);Difficulty in walking, not elsewhere classified (R26.2)     Time: 1240-1305 PT Time Calculation (min) (ACUTE ONLY): 25 min  Charges:  $Therapeutic Exercise: 8-22 mins $Therapeutic Activity: 8-22 mins                      Tori Izabela Ow PT, DPT 07/29/20, 2:43 PM

## 2020-07-29 NOTE — Progress Notes (Signed)
PROGRESS NOTE    Brenda [name]  CBU:[id number] DOB: 04-03-1960 DOA: 07/01/2020 PCP: Pcp, No    Brief Narrative:  Mrs. Brenda [name] was admitted to the hospital with a working diagnosis of acute kidney injury and metabolic acidosis, complicated by uncontrolled seizures and severe anemia/ pancytopenia.  60 year old female past medical history of stage IV lung adenocarcinoma who recently received 12 cycle of pemetrexed and pembrolizumab 3/29, who presented with epistaxis, pancytopenia and renal failure. In the emergency department she was noted to have a seizure.Her vital signs include blood pressure 101/71, pulse rate 113, temperature 89.8, respiratory rate 28, oxygen saturation 100%. Lungswereclear to auscultation bilaterally, heart S1-S2, present, rhythmic, soft abdomen, no lower extremity edema. Patient was fatigued, somnolent and hyporeactive.  Sodium 133, potassium 3.9, chloride 104, bicarb less than 7, glucose 190, BUN 91, creatinine 5.56, magnesium 2.1, lactic acid>11. White cell count 0.7, hemoglobin 2.4, hematocrit 7.9, platelets less than 5. SARS COVID-19 negative. Urinalysis 100 protein, specific gravity 1.008,> 50red cells, 11-20 white cells  Drug screen positive for cocaine. Head CT no acute changes, mild atrophy. Chest radiograph with hyperinflation, no infiltrates.  EKG 80 bpm, normal axis, QTC 517, sinus rhythm with PACs, poor R wave progression, concave ST elevation in V4-V6, large precordial T waves.  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 Her cell count stabilized.  With supportive medical therapy her kidney function improved. Further work-up with RBC scan showed bleeding at the small bowel. Gastroenterology recommended supportive noninvasive approach.  Prolonged hospitalization, complicated by coag negative staph bacteremia,she received intravenous cefazolin with good  toleration.  Currently patient's home discharge is not safe, no family support and patient is not mobile, lives in a [place].   Possible transfer to SNF.     Assessment & Plan:   Principal Problem:   Acute on chronic anemia Active Problems:   Malignant neoplasm of right lung (HCC)   Hypokalemia   Gastrointestinal hemorrhage   Pancytopenia (HCC)   Renal failure   AKI (acute kidney injury) ([place])   Acute blood loss anemia   Bacteremia associated with intravascular line ([place])    1. Pancytopenia, chemotherapy induced. Stage IV lung cancer. Completed steroids for ITP.  On oraliron supplementation for iron deficiency anemia.  Likely only candidate for immunotherapy at this point, will follow with oncology as outpatient.   Pain control with tramadol.   Patient very weak and deconditioned, pending possible transfer to SNF, currently not safe for dc home.   2. AKIon CKD stage IV.AKI has resolved, follow up renal function as outpatient. Currently tolerating po well.  3. Small bowel bleeding (acute blood loss anemia). hgb and hct have been stable. No signs of recurrent bleeding.   4. Seizures.Clinically controlled, resolved.  5. Right upper extremity phlebitis, superficial vein thrombosis in the cephalic vein. Coag negative staph bacteremia,  Clinically resolved,  6. HTN.Blood pressure control with amlodipine.   7.Hyperglycemia, steroid inducedcompleted steroid therapy.   Status is: Inpatient  Remains inpatient appropriate because: unsafe discharge home   Dispo:  Patient From: Home  Planned Disposition: [place]  Medically stable for discharge: yes     DVT prophylaxis: scd Code Status:   full  Family Communication:  No family at the bedside     Subjective: Patient is feeling better, no nausea or vomiting, no dyspnea or chest pain. Continue to be very weak and deconditioned.   Objective: Vitals:   07/28/20 0510 07/28/20 1355  07/28/20 2033 07/29/20 0435  BP: 126/72 (!) 151/83 (!) 164/85 (!) 146/83  Pulse: 97 (!) 101 100 95  Resp: 18 18 18 16   Temp: 98.4 F (36.9 C) 98.2 F (36.8 C) 98.5 F (36.9 C) 98 F (36.7 C)  TempSrc: Oral Oral Oral Oral  SpO2: 95% 95% 97% 92%  Weight:    50.4 kg  Height:        Intake/Output Summary (Last 24 hours) at 07/29/2020 1215 Last data filed at 07/29/2020 0500 Gross per 24 hour  Intake 840 ml  Output 1500 ml  Net -660 ml   Filed Weights   07/25/20 0344 07/26/20 0413 07/29/20 0435  Weight: 69.2 kg 65.9 kg 50.4 kg    Examination:   General: Not in pain or dyspnea, deconditioned  Neurology: Awake and alert, non focal  E ENT: mild pallor, no icterus, oral mucosa moist Cardiovascular: No JVD. S1-S2 present, rhythmic, no gallops, rubs, or murmurs. No lower extremity edema. Pulmonary: positive breath sounds bilaterally, adequate air movement, no wheezing, rhonchi or rales. Gastrointestinal. Abdomen soft and non tender Skin. No rashes Musculoskeletal: no joint deformities     Data Reviewed: I have personally reviewed following labs and imaging studies  CBC: Recent Labs  Lab 07/23/20 0458 07/24/20 0357 07/25/20 0315  WBC 9.0 9.2 8.9  NEUTROABS 6.0 5.5 5.7  HGB 9.9* 9.8* 9.7*  HCT 30.0* 29.4* 29.6*  MCV 88.2 88.3 89.7  PLT 34* 32* 37*   Basic Metabolic Panel: Recent Labs  Lab 07/23/20 0458 07/24/20 0357 07/25/20 0315  NA 145 145 144  K 3.4* 3.5 3.5  CL 110 110 106  CO2 24 26 26   GLUCOSE 106* 73 76  BUN 41* 39* 37*  CREATININE 1.77* 1.99* 2.08*  CALCIUM 7.5* 7.5* 7.4*   GFR: Estimated Creatinine Clearance: 23.7 mL/min (A) (by C-G formula based on SCr of 2.08 mg/dL (H)). Liver Function Tests: No results for input(s): AST, ALT, ALKPHOS, BILITOT, PROT, ALBUMIN in the last 168 hours. No results for input(s): LIPASE, AMYLASE in the last 168 hours. No results for input(s): AMMONIA in the last 168 hours. Coagulation Profile: No results for input(s):  INR, PROTIME in the last 168 hours. Cardiac Enzymes: No results for input(s): CKTOTAL, CKMB, CKMBINDEX, TROPONINI in the last 168 hours. BNP (last 3 results) No results for input(s): PROBNP in the last 8760 hours. HbA1C: No results for input(s): HGBA1C in the last 72 hours. CBG: Recent Labs  Lab 07/24/20 0755 07/24/20 1209 07/24/20 1639 07/25/20 0719 07/25/20 1122  GLUCAP 80 123* 97 75 83   Lipid Profile: No results for input(s): CHOL, HDL, LDLCALC, TRIG, CHOLHDL, LDLDIRECT in the last 72 hours. Thyroid Function Tests: No results for input(s): TSH, T4TOTAL, FREET4, T3FREE, THYROIDAB in the last 72 hours. Anemia Panel: No results for input(s): VITAMINB12, FOLATE, FERRITIN, TIBC, IRON, RETICCTPCT in the last 72 hours.    Radiology Studies: I have reviewed all of the imaging during this hospital visit personally     Scheduled Meds: . Chlorhexidine Gluconate Cloth  6 each Topical Daily  . ferrous sulfate  325 mg Oral BID WC  . folic acid  1 mg Oral Daily  . mouth rinse  15 mL Mouth Rinse BID  . pantoprazole  40 mg Oral BID  . polyethylene glycol  17 g Oral BID  . romiPLOStim  1 mcg/kg Subcutaneous Weekly  . sodium chloride flush  10-40 mL Intracatheter Q12H  . traZODone  50 mg Oral QHS   Continuous Infusions:   LOS: 28 days  Ming Kunka Gerome Apley, MD

## 2020-07-30 DIAGNOSIS — D62 Acute posthemorrhagic anemia: Secondary | ICD-10-CM | POA: Diagnosis not present

## 2020-07-30 DIAGNOSIS — N179 Acute kidney failure, unspecified: Secondary | ICD-10-CM | POA: Diagnosis not present

## 2020-07-30 DIAGNOSIS — D649 Anemia, unspecified: Secondary | ICD-10-CM | POA: Diagnosis not present

## 2020-07-30 DIAGNOSIS — T827XXD Infection and inflammatory reaction due to other cardiac and vascular devices, implants and grafts, subsequent encounter: Secondary | ICD-10-CM | POA: Diagnosis not present

## 2020-07-30 MED ORDER — HEPARIN SODIUM (PORCINE) 5000 UNIT/ML IJ SOLN: 5000.0000 [IU] | Freq: Three times a day (TID) | INTRAMUSCULAR | Status: DC

## 2020-07-30 NOTE — Progress Notes (Deleted)
[place] OFFICE PROGRESS NOTE  Pcp, No No address on file  DIAGNOSIS: ***  PRIOR THERAPY:  CURRENT THERAPY:  INTERVAL HISTORY: Brenda [name] 60 y.o. female returns for *** regular *** visit for followup of ***   MEDICAL HISTORY: Past Medical History:  Diagnosis Date  . Breast cancer ([place])   . Depression   . Mental disorder     ALLERGIES:  is allergic to aspirin adult low [aspirin].  MEDICATIONS:  No current facility-administered medications for this visit.   Current Outpatient Medications  Medication Sig Dispense Refill  . polyethylene glycol (MIRALAX / GLYCOLAX) 17 g packet Dissolve 1 packet (17 g) in liquid and drink 2 (two) times daily. 14 each 0   Facility-Administered Medications Ordered in Other Visits  Medication Dose Route Frequency Provider Last Rate Last Admin  . acetaminophen (TYLENOL) tablet 650 mg  650 mg Oral Q6H PRN Opyd, Ilene Qua, MD   650 mg at 07/19/20 2314  . alum & mag hydroxide-simeth (MAALOX/MYLANTA) 200-200-20 MG/5ML suspension 15-30 mL  15-30 mL Oral Q4H PRN Cherene Altes, MD   30 mL at 07/12/20 1507  . Chlorhexidine Gluconate Cloth 2 % PADS 6 each  6 each Topical Daily Hunsucker, Bonna Gains, MD   6 each at 07/30/20 1154  . diphenhydrAMINE (BENADRYL) capsule 25 mg  25 mg Oral Q6H PRN Dessa Phi, DO   25 mg at 07/24/20 2243  . docusate sodium (COLACE) capsule 100 mg  100 mg Oral BID PRN Gleason, Otilio Carpen, PA-C      . ferrous sulfate tablet 325 mg  325 mg Oral BID WC Dessa Phi, DO   325 mg at 07/30/20 1823  . folic acid (FOLVITE) tablet 1 mg  1 mg Oral Daily Dessa Phi, DO   1 mg at 07/30/20 0850  . Gerhardt's butt cream   Topical PRN Flora Lipps, MD   Given at 07/15/20 1744  . heparin lock flush 100 unit/mL  500 Units Intracatheter Prior to discharge Arrien, Jimmy Picket, MD      . hydrocortisone 1 % lotion   Topical BID PRN Dessa Phi, DO   Given at 07/11/20 1052  . ipratropium-albuterol (DUONEB) 0.5-2.5  (3) MG/3ML nebulizer solution 3 mL  3 mL Nebulization Q4H PRN Dessa Phi, DO      . loperamide (IMODIUM) capsule 2 mg  2 mg Oral PRN Dessa Phi, DO   2 mg at 07/11/20 0915  . MEDLINE mouth rinse  15 mL Mouth Rinse BID Marcell Anger, MD   15 mL at 07/30/20 1131  . ondansetron (ZOFRAN) injection 4 mg  4 mg Intravenous Q8H PRN Lovey Newcomer T, NP   4 mg at 07/20/20 0907  . pantoprazole (PROTONIX) EC tablet 40 mg  40 mg Oral BID Cherene Altes, MD   40 mg at 07/30/20 0849  . polyethylene glycol (MIRALAX / GLYCOLAX) packet 17 g  17 g Oral BID Tawni Millers, MD   17 g at 07/30/20 0850  . romiPLOStim (NPLATE) injection 75 mcg  1 mcg/kg Subcutaneous Weekly Mikey Bussing R, NP   75 mcg at 07/26/20 1028  . sodium chloride flush (NS) 0.9 % injection 10-40 mL  10-40 mL Intracatheter Q12H Cherene Altes, MD   10 mL at 07/25/20 2156  . sodium chloride flush (NS) 0.9 % injection 10-40 mL  10-40 mL Intracatheter PRN Cherene Altes, MD   10 mL at 07/17/20 0404  . traMADol (ULTRAM) tablet 50 mg  50 mg Oral Q6H PRN Cherene Altes, MD   50 mg at 07/30/20 0853  . traZODone (DESYREL) tablet 50 mg  50 mg Oral QHS Arrien, Jimmy Picket, MD   50 mg at 07/29/20 2235    SURGICAL HISTORY:  Past Surgical History:  Procedure Laterality Date  . ESOPHAGOGASTRODUODENOSCOPY (EGD) WITH PROPOFOL N/A 04/09/2020   Procedure: ESOPHAGOGASTRODUODENOSCOPY (EGD) WITH PROPOFOL;  Surgeon: Doran Stabler, MD;  Location: [place];  Service: Gastroenterology;  Laterality: N/A;  . IR IMAGING GUIDED PORT INSERTION  11/03/2019  . TUBAL LIGATION    . VIDEO BRONCHOSCOPY WITH ENDOBRONCHIAL NAVIGATION N/A 04/04/2019   Procedure: VIDEO BRONCHOSCOPY WITH ENDOBRONCHIAL NAVIGATION;  Surgeon: Garner Nash, DO;  Location: [place];  Service: Thoracic;  Laterality: N/A;  . VIDEO BRONCHOSCOPY WITH ENDOBRONCHIAL ULTRASOUND N/A 04/04/2019   Procedure: VIDEO BRONCHOSCOPY WITH ENDOBRONCHIAL ULTRASOUND;   Surgeon: Garner Nash, DO;  Location: [place];  Service: Thoracic;  Laterality: N/A;    REVIEW OF SYSTEMS:   Review of Systems  Constitutional: Negative for appetite change, chills, fatigue, fever and unexpected weight change.  HENT:   Negative for mouth sores, nosebleeds, sore throat and trouble swallowing.   Eyes: Negative for eye problems and icterus.  Respiratory: Negative for cough, hemoptysis, shortness of breath and wheezing.   Cardiovascular: Negative for chest pain and leg swelling.  Gastrointestinal: Negative for abdominal pain, constipation, diarrhea, nausea and vomiting.  Genitourinary: Negative for bladder incontinence, difficulty urinating, dysuria, frequency and hematuria.   Musculoskeletal: Negative for back pain, gait problem, neck pain and neck stiffness.  Skin: Negative for itching and rash.  Neurological: Negative for dizziness, extremity weakness, gait problem, headaches, light-headedness and seizures.  Hematological: Negative for adenopathy. Does not bruise/bleed easily.  Psychiatric/Behavioral: Negative for confusion, depression and sleep disturbance. The patient is not nervous/anxious.     PHYSICAL EXAMINATION:  Last menstrual period 01/19/2011.  ECOG PERFORMANCE STATUS: {CHL ONC ECOG [id number]  Physical Exam  Constitutional: Oriented to person, place, and time and well-developed, well-nourished, and in no distress. No distress.  HENT:  Head: Normocephalic and atraumatic.  Mouth/Throat: Oropharynx is clear and moist. No oropharyngeal exudate.  Eyes: Conjunctivae are normal. Right eye exhibits no discharge. Left eye exhibits no discharge. No scleral icterus.  Neck: Normal range of motion. Neck supple.  Cardiovascular: Normal rate, regular rhythm, normal heart sounds and intact distal pulses.   Pulmonary/Chest: Effort normal and breath sounds normal. No respiratory distress. No wheezes. No rales.  Abdominal: Soft. Bowel sounds are normal. Exhibits no  distension and no mass. There is no tenderness.  Musculoskeletal: Normal range of motion. Exhibits no edema.  Lymphadenopathy:    No cervical adenopathy.  Neurological: Alert and oriented to person, place, and time. Exhibits normal muscle tone. Gait normal. Coordination normal.  Skin: Skin is warm and dry. No rash noted. Not diaphoretic. No erythema. No pallor.  Psychiatric: Mood, memory and judgment normal.  Vitals reviewed.  LABORATORY DATA: Lab Results  Component Value Date   WBC 8.9 07/25/2020   HGB 9.7 (L) 07/25/2020   HCT 29.6 (L) 07/25/2020   MCV 89.7 07/25/2020   PLT 37 (L) 07/25/2020      Chemistry      Component Value Date/Time   NA 144 07/25/2020 0315   K 3.5 07/25/2020 0315   CL 106 07/25/2020 0315   CO2 26 07/25/2020 0315   BUN 37 (H) 07/25/2020 0315   CREATININE 2.08 (H) 07/25/2020 0315   CREATININE 1.33 (H) 06/18/2020 1126  Component Value Date/Time   CALCIUM 7.4 (L) 07/25/2020 0315   ALKPHOS 122 07/14/2020 0353   AST 17 07/14/2020 0353   AST 22 06/18/2020 1126   ALT <5 07/14/2020 0353   ALT 10 06/18/2020 1126   BILITOT 0.4 07/14/2020 0353   BILITOT 0.3 06/18/2020 1126       RADIOGRAPHIC STUDIES:  CT Head Wo Contrast  Result Date: 07/01/2020 CLINICAL DATA:  Recent seizure activity EXAM: CT HEAD WITHOUT CONTRAST TECHNIQUE: Contiguous axial images were obtained from the base of the skull through the vertex without intravenous contrast. COMPARISON:  04/07/2020 FINDINGS: Brain: Mild atrophic changes are noted. No findings to suggest acute hemorrhage, acute infarction or space-occupying mass lesion noted. Vascular: No hyperdense vessel or unexpected calcification. Skull: Normal. Negative for fracture or focal lesion. Sinuses/Orbits: No acute finding. Chronic soft tissue changes are noted in the left ethmoid sinuses. Other: None. IMPRESSION: Mild atrophic changes without acute abnormality. Electronically Signed   By: Inez Catalina M.D.   On: 07/01/2020 15:05    NM GI Blood Loss  Result Date: 07/02/2020 CLINICAL DATA:  Acute lower GI bleeding, 3 episodes of large volume hematochezia. History of lung cancer EXAM: NUCLEAR MEDICINE GASTROINTESTINAL BLEEDING SCAN TECHNIQUE: Sequential abdominal images were obtained following intravenous administration of Tc-27m labeled red blood cells. RADIOPHARMACEUTICALS:  22.7 mCi Tc-64m pertechnetate in-vitro labeled red cells. COMPARISON:  05/21/2020 CT with contrast FINDINGS: Abnormal activity within the mid and lower abdomen extending into the upper pelvis and peristalsing throughout loops of bowel compatible with mild acute GI bleeding. Based on the pattern of peristalsis, it is difficult to determine location but small bowel is favored over colon. IMPRESSION: Positive exam for mild acute GI bleeding as above. These results will be called to the ordering clinician or representative by the Radiologist Assistant, and communication documented in the PACS or [employer]. Electronically Signed   By: Jerilynn Mages.  Shick M.D.   On: 07/02/2020 12:08   US RENAL  Result Date: 07/01/2020 CLINICAL DATA:  Acute kidney injury. EXAM: RENAL / URINARY TRACT ULTRASOUND COMPLETE COMPARISON:  CT abdomen pelvis dated May 21, 2020. FINDINGS: Right Kidney: Renal measurements: 10.0 x 4.2 x 5.0 cm = volume: 109 mL. Echogenicity within normal limits. No mass or hydronephrosis visualized. Left Kidney: Renal measurements: 9.1 x 5.1 x 5.3 cm = volume: 128 mL. Echogenicity within normal limits. No mass or hydronephrosis visualized. Other: Bilateral pleural effusions. IMPRESSION: 1. Normal renal ultrasound. 2. Bilateral pleural effusions. Electronically Signed   By: Titus Dubin M.D.   On: 07/01/2020 15:55   DG CHEST PORT 1 VIEW  Result Date: 07/07/2020 CLINICAL DATA:  History of lung carcinoma with weakness EXAM: PORTABLE CHEST 1 VIEW COMPARISON:  07/01/2020 FINDINGS: Cardiac shadow is stable. Right-sided chest wall port is again noted and stable. New  bilateral pleural effusions are noted left greater than right with likely underlying atelectasis. No acute bony abnormality is seen. IMPRESSION: New bilateral pleural effusions left greater than right. Electronically Signed   By: Inez Catalina M.D.   On: 07/07/2020 20:13   DG Chest Port 1 View  Result Date: 07/01/2020 CLINICAL DATA:  Shortness of breath EXAM: PORTABLE CHEST 1 VIEW COMPARISON:  April 04, 2019 FINDINGS: Port-A-Cath tip is in the superior vena cava. No pneumothorax. There is a rather minimal right pleural effusion. No edema or airspace opacity. Heart size and pulmonary vascularity are normal. No adenopathy. No bone lesions. IMPRESSION: Port-A-Cath as noted. Other minimal right pleural effusion. Lungs otherwise clear. Cardiac silhouette normal.  Electronically Signed   By: Lowella Grip III M.D.   On: 07/01/2020 11:44   VAS [place] UPPER EXTREMITY VENOUS DUPLEX  Result Date: 07/21/2020 UPPER VENOUS STUDY  Patient Name:  Brenda [name]  Date of Exam:   07/21/2020 Medical Rec #: [id number]       Accession #:    [id number] Date of Birth: February 04, 1961       Patient Gender: F Patient Age:   59Y Exam Location:  [facility] Procedure:      VAS [place] UPPER EXTREMITY VENOUS DUPLEX Referring Phys: [id number] [place] --------------------------------------------------------------------------------  Indications: Edema Comparison Study: no prior Performing Technologist: Abram Sander RVS  Examination Guidelines: A complete evaluation includes B-mode imaging, spectral Doppler, color Doppler, and power Doppler as needed of all accessible portions of each vessel. Bilateral testing is considered an integral part of a complete examination. Limited examinations for reoccurring indications may be performed as noted.  Right Findings: +----------+------------+---------+-----------+----------+-------+ RIGHT     CompressiblePhasicitySpontaneousPropertiesSummary  +----------+------------+---------+-----------+----------+-------+ IJV           Full       Yes       Yes                      +----------+------------+---------+-----------+----------+-------+ Subclavian    Full       Yes       Yes                      +----------+------------+---------+-----------+----------+-------+ Axillary      Full       Yes       Yes                      +----------+------------+---------+-----------+----------+-------+ Brachial      Full       Yes       Yes                      +----------+------------+---------+-----------+----------+-------+ Radial        Full                                          +----------+------------+---------+-----------+----------+-------+ Ulnar         Full                                          +----------+------------+---------+-----------+----------+-------+ Cephalic      Full                                          +----------+------------+---------+-----------+----------+-------+ Basilic       Full                                          +----------+------------+---------+-----------+----------+-------+  Left Findings: +----------+------------+---------+-----------+----------+-----------------+ LEFT      CompressiblePhasicitySpontaneousProperties     Summary      +----------+------------+---------+-----------+----------+-----------------+ IJV           Full       Yes       Yes                                +----------+------------+---------+-----------+----------+-----------------+  Subclavian    Full       Yes       Yes                                +----------+------------+---------+-----------+----------+-----------------+ Axillary      Full       Yes       Yes                                +----------+------------+---------+-----------+----------+-----------------+ Brachial      Full       Yes       Yes                                 +----------+------------+---------+-----------+----------+-----------------+ Radial        Full                                                    +----------+------------+---------+-----------+----------+-----------------+ Ulnar         Full                                                    +----------+------------+---------+-----------+----------+-----------------+ Cephalic      None                                  Age Indeterminate +----------+------------+---------+-----------+----------+-----------------+ Basilic       Full                                                    +----------+------------+---------+-----------+----------+-----------------+  Summary:  Right: No evidence of deep vein thrombosis in the upper extremity. No evidence of superficial vein thrombosis in the upper extremity.  Left: Findings consistent with age indeterminate superficial vein thrombosis involving the left cephalic vein.  *See table(s) above for measurements and observations.  Diagnosing physician: Harold Barban MD Electronically signed by Harold Barban MD on 07/21/2020 at 4:26:43 PM.    Final      ASSESSMENT/PLAN:  No problem-specific Assessment & Plan notes found for this encounter.   No orders of the defined types were placed in this encounter.    I spent {CHL ONC TIME VISIT - LJQGB:[id number]} counseling the patient face to face. The total time spent in the appointment was {CHL ONC TIME VISIT - XJOIT:[id number]}.  Emilo Gras L Shenita Trego, PA-C 07/30/20

## 2020-07-30 NOTE — TOC Progression Note (Signed)
Transition of Care [facility]) - Progression Note    Patient Details  Name: Brenda [name] MRN: [id number] Date of Birth: [birth year]  Transition of Care [facility]) CM/SW Contact  Dhanush Jokerst, Juliann Pulse, RN Phone Number: 07/30/2020, 3:48 PM  [place] in rm with patient-when asked if willing to come to [place] states no. Patient agreed to speaker phone for [name] whom she called on her own-asked again if willing to go to rehab-patient stated no I will go home to my hotel at [employer]. Dr. Cathlean Sauer also on speaker phone who stated to patient that he spoke to her son, & niece who agreed to rehab-she again said no. [facility] can still provide to go to SNF [facility] once at home-patient has completed their forms. Per attending will address d/c plan after research in am. Continue to monitor for d/c plans.     Expected Discharge Plan: Skilled Nursing Facility Barriers to Discharge: No Barriers Identified  Expected Discharge Plan and Services Expected Discharge Plan: [place] In-house Referral: Clinical Social Work Discharge Planning Services: CM Consult Post Acute Care Choice: Durable Medical Equipment Living arrangements for the past 2 months: Single Family Home                 DME Arranged: Walker rolling with seat DME Agency: [employer] Date DME Agency Contacted: 07/17/20 Time DME Agency Contacted: [phone] Representative spoke with at DME Agency: [place] ([place]) Interventions    Readmission Risk Interventions Readmission Risk Prevention Plan 07/17/2020 04/11/2020  Transportation Screening Complete Complete  PCP or Specialist Appt within 3-5 Days - Complete  HRI or [place] Complete Complete  Social Work Consult for [place] Planning/Counseling Complete Complete  Palliative Care Screening Not Applicable Not Applicable  Medication Review [employer] photographer) Complete  Complete  Some recent data might be hidden

## 2020-07-30 NOTE — Progress Notes (Addendum)
PROGRESS NOTE    Brenda [name]  KGU:[id number] DOB: 07-19-1960 DOA: 07/01/2020 PCP: Pcp, No    Brief Narrative:  Mrs. Brenda [name] was admitted to the hospital with a working diagnosis of acute kidney injury and metabolic acidosis, complicated by uncontrolled seizures and severe anemia/ pancytopenia.  60 year old female past medical history of stage IV lung adenocarcinoma who recently received 12 cycle of pemetrexed and pembrolizumab 3/29, who presented with epistaxis, pancytopenia and renal failure. In the emergency department she was noted to have a seizure.Her vital signs include blood pressure 101/71, pulse rate 113, temperature 89.8, respiratory rate 28, oxygen saturation 100%. Lungswereclear to auscultation bilaterally, heart S1-S2, present, rhythmic, soft abdomen, no lower extremity edema. Patient was fatigued, somnolent and hyporeactive.  Sodium 133, potassium 3.9, chloride 104, bicarb less than 7, glucose 190, BUN 91, creatinine 5.56, magnesium 2.1, lactic acid>11. White cell count 0.7, hemoglobin 2.4, hematocrit 7.9, platelets less than 5. SARS COVID-19 negative. Urinalysis 100 protein, specific gravity 1.008,> 50red cells, 11-20 white cells  Drug screen positive for cocaine. Head CT no acute changes, mild atrophy. Chest radiograph with hyperinflation, no infiltrates.  EKG 80 bpm, normal axis, QTC 517, sinus rhythm with PACs, poor R wave progression, concave ST elevation in V4-V6, large precordial T waves.  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 Her cell count stabilized.  With supportive medical therapy her kidney function improved. Further work-up with RBC scan showed bleeding at the small bowel. Gastroenterology recommended supportive noninvasive approach.  Prolonged hospitalization,complicated by coag negative staph bacteremia,she received intravenous cefazolin with good  toleration.  Currently patient's home discharge is not safe, no family support and patient is not mobile, lives in a [place].   Possible transfer to SNF.    Assessment & Plan:   Principal Problem:   Acute on chronic anemia Active Problems:   Malignant neoplasm of right lung (HCC)   Hypokalemia   Gastrointestinal hemorrhage   Pancytopenia (HCC)   Renal failure   AKI (acute kidney injury) ([place])   Acute blood loss anemia   Bacteremia associated with intravascular line ([place])   1. Pancytopenia, chemotherapy induced. Stage IV lung cancer. Patient has completed steroids for ITP with good toleration.  Continue with oraliron supplementation for iron deficiency anemia.  Oncology follow as outpatient, possible candidate for immunotherapy.  Continue pain control with tramadol, patient very weak and deconditioned, will benefit from SNF.  2. AKIon CKD stage IV. Her renal function has been stable, patient tolerating po well.   3. Small bowel bleeding (acute blood loss anemia). cell count has been stable, follow as outpatient. Patient is tolerating po well.  Continue to hold on DVT prophylaxis for now du to bleeding complications.   4. Seizures.Clinically controlled, resolved.  5. Right upper extremity phlebitis, superficial vein thrombosis in the cephalic vein. Coag negative staph bacteremia,  Clinically resolved,  6. HTN.On amlodipine for blood pressure control.   7.Hyperglycemia, steroid inducedcompleted steroid therapy.   Status is: Inpatient  Remains inpatient appropriate because:Unsafe d/c plan poor home support, possible transfer to SNF.    Dispo:  Patient From: Home  Planned Disposition: [place]  Medically stable for discharge: yes      DVT prophylaxis: scd    Code Status:   full  Family Communication:  I spoke over the phone with the patient's son about patient's  condition, plan of care, prognosis and all questions were  addressed.    Subjective: Patient tolerating po well, no  nausea or vomiting, no chest pain or dyspnea, continue to be very weak and deconditioned.   Objective: Vitals:   07/29/20 0435 07/29/20 1412 07/29/20 1951 07/30/20 1303  BP: (!) 146/83 131/82 (!) 153/89 133/90  Pulse: 95 (!) 109 (!) 102 (!) 102  Resp: 16 16 18 16   Temp: 98 F (36.7 C) 98.1 F (36.7 C) 98.3 F (36.8 C) 98.5 F (36.9 C)  TempSrc: Oral Oral Oral Oral  SpO2: 92% 99% 97% 97%  Weight: 50.4 kg     Height:        Intake/Output Summary (Last 24 hours) at 07/30/2020 1306 Last data filed at 07/30/2020 2947 Gross per 24 hour  Intake 320 ml  Output 2600 ml  Net -2280 ml   Filed Weights   07/25/20 0344 07/26/20 0413 07/29/20 0435  Weight: 69.2 kg 65.9 kg 50.4 kg    Examination:   General: Not in pain or dyspnea, deconditioned Neurology: Awake and alert, non focal  E ENT: no pallor, no icterus, oral mucosa moist Cardiovascular: No JVD. S1-S2 present, rhythmic, no gallops, rubs, or murmurs. No lower extremity edema. Pulmonary: positive breath sounds bilaterally, adequate air movement, no wheezing, rhonchi or rales. Gastrointestinal. Abdomen soft and non tender Skin. No rashes Musculoskeletal: no joint deformities     Data Reviewed: I have personally reviewed following labs and imaging studies  CBC: Recent Labs  Lab 07/24/20 0357 07/25/20 0315  WBC 9.2 8.9  NEUTROABS 5.5 5.7  HGB 9.8* 9.7*  HCT 29.4* 29.6*  MCV 88.3 89.7  PLT 32* 37*   Basic Metabolic Panel: Recent Labs  Lab 07/24/20 0357 07/25/20 0315  NA 145 144  K 3.5 3.5  CL 110 106  CO2 26 26  GLUCOSE 73 76  BUN 39* 37*  CREATININE 1.99* 2.08*  CALCIUM 7.5* 7.4*   GFR: Estimated Creatinine Clearance: 23.7 mL/min (A) (by C-G formula based on SCr of 2.08 mg/dL (H)). Liver Function Tests: No results for input(s): AST, ALT, ALKPHOS, BILITOT, PROT, ALBUMIN in the last 168 hours. No results for input(s): LIPASE, AMYLASE in the last  168 hours. No results for input(s): AMMONIA in the last 168 hours. Coagulation Profile: No results for input(s): INR, PROTIME in the last 168 hours. Cardiac Enzymes: No results for input(s): CKTOTAL, CKMB, CKMBINDEX, TROPONINI in the last 168 hours. BNP (last 3 results) No results for input(s): PROBNP in the last 8760 hours. HbA1C: No results for input(s): HGBA1C in the last 72 hours. CBG: Recent Labs  Lab 07/24/20 0755 07/24/20 1209 07/24/20 1639 07/25/20 0719 07/25/20 1122  GLUCAP 80 123* 97 75 83   Lipid Profile: No results for input(s): CHOL, HDL, LDLCALC, TRIG, CHOLHDL, LDLDIRECT in the last 72 hours. Thyroid Function Tests: No results for input(s): TSH, T4TOTAL, FREET4, T3FREE, THYROIDAB in the last 72 hours. Anemia Panel: No results for input(s): VITAMINB12, FOLATE, FERRITIN, TIBC, IRON, RETICCTPCT in the last 72 hours.    Radiology Studies: I have reviewed all of the imaging during this hospital visit personally     Scheduled Meds: . Chlorhexidine Gluconate Cloth  6 each Topical Daily  . ferrous sulfate  325 mg Oral BID WC  . folic acid  1 mg Oral Daily  . mouth rinse  15 mL Mouth Rinse BID  . pantoprazole  40 mg Oral BID  . polyethylene glycol  17 g Oral BID  . romiPLOStim  1 mcg/kg Subcutaneous Weekly  . sodium chloride flush  10-40 mL Intracatheter Q12H  . traZODone  50  mg Oral QHS   Continuous Infusions:   LOS: 29 days        Raylene Carmickle Gerome Apley, MD

## 2020-07-30 NOTE — TOC Progression Note (Addendum)
Transition of Care [facility]) - Progression Note    Patient Details  Name: Brenda [name] MRN: [id number] Date of Birth: 1960/10/22  Transition of Care [facility]) CM/SW Contact  Aliyyah Riese, Juliann Pulse, RN Phone Number: 07/30/2020, 9:03 AM  Clinical Narrative:  Was planning on d/c yesterday to SNF once Candelaria Arenas was to make hospital visit but she did not show up or call. Left vm this am to f/u on status.Will also f/u on patient's decision of SNF or home. 10a-Received call from [place] @ [facility]-she will make hospital visit this afternoon-will initiate paperwork,then submit to her corporate office for review-this may take-24hrs-1 week. Will update MD/patient.    Expected Discharge Plan: Skilled Nursing Facility Barriers to Discharge: No Barriers Identified  Expected Discharge Plan and Services Expected Discharge Plan: [place] In-house Referral: Clinical Social Work Discharge Planning Services: CM Consult Post Acute Care Choice: Durable Medical Equipment Living arrangements for the past 2 months: Single Family Home                 DME Arranged: Walker rolling with seat DME Agency: [employer] Date DME Agency Contacted: 07/17/20 Time DME Agency Contacted: [phone] Representative spoke with at DME Agency: [place] ([place]) Interventions    Readmission Risk Interventions Readmission Risk Prevention Plan 07/17/2020 04/11/2020  Transportation Screening Complete Complete  PCP or Specialist Appt within 3-5 Days - Complete  HRI or [place] Complete Complete  Social Work Consult for [place] Planning/Counseling Complete Complete  Palliative Care Screening Not Applicable Not Applicable  Medication Review Press photographer) Complete Complete  Some recent data might be hidden

## 2020-07-31 ENCOUNTER — Inpatient Hospital Stay: Payer: Medicaid Other

## 2020-07-31 ENCOUNTER — Inpatient Hospital Stay: Payer: Medicaid Other | Admitting: Physician Assistant

## 2020-07-31 ENCOUNTER — Other Ambulatory Visit: Payer: Medicaid Other

## 2020-07-31 NOTE — Progress Notes (Signed)
Discharge was cancelled due to acute altered mentation after working with PT/OT.   Plan for dc home on 05/12 if no further altered mental status.

## 2020-07-31 NOTE — TOC Transition Note (Addendum)
Transition of Care [facility]) - CM/SW Discharge Note   Patient Details  Name: Brenda [name] MRN: [id number] Date of Birth: 03/17/1961  Transition of Care [facility]) CM/SW Contact:  Dessa Phi, RN Phone Number: 07/31/2020, 9:28 AM   Clinical Narrative: Please review all prior CM notes:Numerous conversations with patient about her d/c plan-she continues to state I want to go home, & I have been saying that for a long time-CM also asked if there is anyone to discuss her d/c plan with, like her son, [name], or niece-only talk to me. I have informed my supervisor of prior conversations with patient & have went over a safe d/c plan. Patient wants home return back to [facility] [address].[facility] services(custodial level assistance) have left a vm w/patient's c# to do assessment-per Destiny-they can only do assessment with patient on the phone-patient can be on speaker phone with other family members if she chooses-CM informed patient of the message-patient states she has over 100 messages on her phone-CM provided her with the direct tel# to [facility] services, they also have Elige Ko tel# who can also call them but patient must be on the phone also.CM has left vm with William(per patient agreement to contact him only since he is the contact person about the vm on [place] [facility]. Patient wants a w/c-MD notified-await order-[employer] will deliver w/c to rm since patient will transport home by [facility](they do not allow dme on vehicle)Patient informed to contact [name] to pick up w/c from hospital-voiced understanding.[facility] for transport once ready.Patient have spoken to [place] yesterday-patient completed paperwork-she can also go to rehab from home.MD updated.     Final next level of care: Home/Self Care Barriers to Discharge: No Barriers Identified   Patient Goals and CMS Choice Patient states their goals for this hospitalization and ongoing  recovery are:: to go home CMS Medicare.gov Compare Post Acute Care list provided to:: Patient Choice offered to / list presented to : Patient  Discharge Placement                       Discharge Plan and Services In-house Referral: Clinical Social Work Discharge Planning Services: CM Consult Post Acute Care Choice: Durable Medical Equipment          DME Arranged: Wheelchair manual DME Agency: [employer] Date DME Agency Contacted: 07/31/20 Time DME Agency Contacted: [phone] Representative spoke with at DME Agency: [place] ([place]) Interventions     Readmission Risk Interventions Readmission Risk Prevention Plan 07/17/2020 04/11/2020  Transportation Screening Complete Complete  PCP or Specialist Appt within 3-5 Days - Complete  HRI or [place] Complete Complete  Social Work Consult for [place] Planning/Counseling Complete Complete  Palliative Care Screening Not Applicable Not Applicable  Medication Review Press photographer) Complete Complete  Some recent data might be hidden

## 2020-07-31 NOTE — Care Management (Cosign Needed)
    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  A walker will not resolve issue with performing activities of daily living. A wheelchair will allow patient to safely perform daily activities. Patient can safely propel the wheelchair in the home or has a caregiver who can provide assistance. Length of need 6 months . Accessories: elevating leg rests (ELRs), wheel locks, extensions and anti-tippers.   07/31/20 0925   07/26/20 1047  For home use only DME Hospital bed  Once       Question Answer Comment  Length of Need 6 Months   The above medical condition requires: Patient requires the ability to reposition frequently   Head must be elevated greater than: 45 degrees   Bed type Semi-electric   Support Surface: Gel Overlay      07/26/20 1046   07/17/20 1533  For home use only DME Walker rolling  Once       Comments: Rollator  Question Answer Comment  Walker: With [place]   Patient needs a walker to treat with the following condition Weakness      07/17/20 1533

## 2020-07-31 NOTE — Progress Notes (Addendum)
Physical Therapy Treatment Patient Details Name: Brenda [name] MRN: [id number] DOB: [birth year] Today's Date: 07/31/2020    History of Present Illness 60 years old female adm with Bacteremia associated with intravascular line, ARF, acute GI bleed, pancytopenia.   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 Julien Nordmann presented to the hospital with epistaxis, dark stools and generalized weakness for few days.  CXR showed Port-A-Cath in place and minimal right pleural effusion but otherwise clear.    PT Comments    Pt alert and oriented, pleasant, agreeable to therapy. Pt able to come to EOB with supv for safety, rises to stand with min A +2 for safety. Heavy VCs for hand placement and glute/quad activation to power to stand, hip extension noted last with all attempts. Pt ambulates 16 ft x2 in room, forward and backwards, completing turns, no LOB, denies pain and dizziness, seated rest break between 2 bouts. After sitting for ~1 minute in recliner, pt voicing needs then states "I'm dizzy", stops talking, trunk leans back into recliner with arms and legs going stiff, arms pumping at side, eyes opened, lasting 3-5 seconds. Pt then states "I'm hot" and "I think I pooped myself", HR elevated to 130, BP and SpO2 on RA stable- RN in room to assess. Pt continues to verbalize orientation questions correctly, able to power to stand min A +2 with RN in room to provide pericare. Pt denies dizziness while ambulating, states SOB complaints when RN in room with SpO2 98% on RA. Pt appears back to baseline at EOS while seated up in recliner with call bell in hand. Will continue to progress acute PT as able.    Follow Up Recommendations  SNF     Equipment Recommendations  Rolling walker with 5" wheels;Wheelchair (measurements PT);Wheelchair cushion (measurements PT)    Recommendations for Other Services       Precautions /  Restrictions Precautions Precautions: Fall Precaution Comments: monitor VS Restrictions Weight Bearing Restrictions: No    Mobility  Bed Mobility Overal bed mobility: Needs Assistance Bed Mobility: Supine to Sit  Supine to sit: Supervision  General bed mobility comments: light use of bedrail to upright trunk to EOB, supv for safety    Transfers Overall transfer level: Needs assistance Equipment used: Rolling walker (2 wheeled) Transfers: Sit to/from Stand Sit to Stand: Min assist;+2 safety/equipment  General transfer comment: min A +2 for safety, VCs for hand placement and quad/glute activation, steady upon rising, hip extension noted last  Ambulation/Gait Ambulation/Gait assistance: Min assist;+2 safety/equipment Gait Distance (Feet): 16 Feet (x2) Assistive device: Rolling walker (2 wheeled) Gait Pattern/deviations: Step-through pattern;Decreased stride length Gait velocity: decreased   General Gait Details: pt ambulates 16 ft x2 with seated rest break between, completes turns and backwards steps in room with RW, no LOB, follows commands and maintains body within RW frame, denies dizziness and pain with ambulation   Stairs             Wheelchair Mobility    Modified Rankin (Stroke Patients Only)       Balance Overall balance assessment: Needs assistance Sitting-balance support: Feet supported Sitting balance-Leahy Scale: Good Sitting balance - Comments: seated EOB   Standing balance support: During functional activity;Bilateral upper extremity supported Standing balance-Leahy Scale: Poor Standing balance comment: reliant on UE support     Cognition Arousal/Alertness: Awake/alert Behavior During Therapy: WFL for tasks assessed/performed;Anxious Overall Cognitive Status: Within Functional Limits for tasks assessed  General Comments:  Pt alert and oriented to self, location and situation, see general comments for details      Exercises General Exercises -  Lower Extremity Ankle Circles/Pumps: Supine;AROM;Both;20 reps Long Arc Quad: Seated;AROM;Strengthening;Both;15 reps Hip Flexion/Marching: Seated;AROM;Strengthening;Both;15 reps    General Comments General comments (skin integrity, edema, etc.): Pt alert and oriented to self, location and situation, conversational and pleasant. After ambulation, pt returns to sitting, asks for water and tray table, then reports "I'm dizzy", pt then leans trunk back in recliner with arms and legs going stiff and arms pumping at side for 3-5 sec, therapist elevated pt's feet and pt begins talking to PT, able to correctly state name, location and situation. Pt states "I'm hot" and "I think I pooped myself", provided cool washcloth, VSS (HR 130, BP 151/98 and SpO2 98% on RA)-RN in room to assess      Pertinent Vitals/Pain Pain Assessment: No/denies pain    Home Living                      Prior Function            PT Goals (current goals can now be found in the care plan section) Acute Rehab PT Goals PT Goal Formulation: With patient Time For Goal Achievement: 08/05/20 Potential to Achieve Goals: Fair Progress towards PT goals: Progressing toward goals    Frequency    Min 2X/week      PT Plan Current plan remains appropriate    Co-evaluation              AM-PAC PT "6 Clicks" Mobility   Outcome Measure  Help needed turning from your back to your side while in a flat bed without using bedrails?: A Little Help needed moving from lying on your back to sitting on the side of a flat bed without using bedrails?: A Little Help needed moving to and from a bed to a chair (including a wheelchair)?: A Lot Help needed standing up from a chair using your arms (e.g., wheelchair or bedside chair)?: A Lot Help needed to walk in hospital room?: A Lot Help needed climbing 3-5 steps with a railing? : Total 6 Click Score: 13    End of Session Equipment Utilized During Treatment: Gait  belt Activity Tolerance: Patient limited by fatigue;Treatment limited secondary to medical complications (Comment) Patient left: in chair;with call bell/phone within reach;with chair alarm set Nurse Communication: Mobility status;Other (comment) (RN in room to assess) PT Visit Diagnosis: Muscle weakness (generalized) (M62.81);Difficulty in walking, not elsewhere classified (R26.2)     Time: 6269-4854 PT Time Calculation (min) (ACUTE ONLY): 28 min  Charges:  $Gait Training: 8-22 mins $Therapeutic Activity: 8-22 mins                      Tori Terris Germano PT, DPT 07/31/20, 10:45 AM

## 2020-07-31 NOTE — Progress Notes (Signed)
This RN called to patients room by PT after completing ambulation in room and being seated in chair.  Upon entering room patient alert and appears at baseline, but PT reports that "After sitting for ~1 minute in recliner, pt voicing needs then states "I'm dizzy", stops talking, trunk leans back into recliner with arms and legs going stiff, arms pumping at side, eyes opened, lasting 3-5 seconds. Pt then states "I'm hot" and "I think I pooped myself"  HR elevated at this time in 120's as was while ambulating, and BP elevated but O2 and RR normal.  Patient denies dizziness at this time and was able to stand with 2 assist for peri care.  Patient states "I feel like I just blacked out".  Patient appears back to previous state at this time.  MD made aware and will cancel discharge orders for now.  Will continue to monitor.

## 2020-07-31 NOTE — Progress Notes (Addendum)
Patient is feeling well, no nausea, no vomiting, continues to be very weak.  Vital signs with temperature 99.3, blood pressure 149/97, heart rate 102, respiratory rate 18.  Her lungs are clear to auscultation, heart S1-S2, present, rhythmic, soft abdomen, no lower extremity edema.  Plan is to discharge patient home today.  She has declined skilled nursing facility, she is clinically competent to make her own decisions. I spoke with her son over the phone and he is aware of the situation and he will check on her regularly. Wheelchair will be provided, unfortunately a hospital bed does not feet into her room.

## 2020-08-01 DIAGNOSIS — D649 Anemia, unspecified: Secondary | ICD-10-CM | POA: Diagnosis not present

## 2020-08-01 LAB — BASIC METABOLIC PANEL
Anion gap: 8 (ref 5–15)
BUN: 21 mg/dL — ABNORMAL HIGH (ref 6–20)
CO2: 26 mmol/L (ref 22–32)
Calcium: 7.2 mg/dL — ABNORMAL LOW (ref 8.9–10.3)
Chloride: 106 mmol/L (ref 98–111)
Creatinine, Ser: 2.52 mg/dL — ABNORMAL HIGH (ref 0.44–1.00)
GFR, Estimated: 22 mL/min — ABNORMAL LOW (ref >60)
Glucose, Bld: 99 mg/dL (ref 70–99)
Potassium: 2.9 mmol/L — ABNORMAL LOW (ref 3.5–5.1)
Sodium: 140 mmol/L (ref 135–145)

## 2020-08-01 LAB — CBC
HCT: 24.9 % — ABNORMAL LOW (ref 36.0–46.0)
Hemoglobin: 8.1 g/dL — ABNORMAL LOW (ref 12.0–15.0)
MCH: 29.6 pg (ref 26.0–34.0)
MCHC: 32.5 g/dL (ref 30.0–36.0)
MCV: 90.9 fL (ref 80.0–100.0)
Platelets: 78 10*3/uL — ABNORMAL LOW (ref 150–400)
RBC: 2.74 MIL/uL — ABNORMAL LOW (ref 3.87–5.11)
RDW: 15.2 % (ref 11.5–15.5)
WBC: 5.6 10*3/uL (ref 4.0–10.5)
nRBC: 0 % (ref 0.0–0.2)

## 2020-08-01 MED ORDER — POTASSIUM CHLORIDE 20 MEQ PO PACK
40.0000 meq | PACK | Freq: Once | ORAL | Status: AC
  Administered 2020-08-01: 40 meq via ORAL
  Filled 2020-08-01: qty 2

## 2020-08-01 NOTE — Progress Notes (Signed)
VAST consult received to change pt's dressing d/t biopatch coming out from around port needle. Pt might be discharged today. Awaiting physician visit prior to changing port needle and dressing. Unit RN advised; she will place new consult when physician stops by patient's room.

## 2020-08-01 NOTE — TOC Progression Note (Addendum)
Transition of Care [facility]) - Progression Note    Patient Details  Name: Brenda [name] MRN: [id number] Date of Birth: [birth year]  Transition of Care [facility]) CM/SW Contact  Taneesha Edgin, Juliann Pulse, RN Phone Number: 08/01/2020, 3:19 PM  Clinical Narrative:   D/c plan home once medically stable. Per patient permission spoke to son [name] [phone]-d/c plan home. Patient has in rm her rollator, [employer] rep Thedore Mins to deliver w/c to rm prior d/c. John son will pick up dme & take home. Patient will be w/c transport for home. See all prior notes for prior services set up,& resources. Late entry update:PTAR for transport home.    Expected Discharge Plan: Home/Self Care Barriers to Discharge: No Barriers Identified  Expected Discharge Plan and Services Expected Discharge Plan: Home/Self Care In-house Referral: Clinical Social Work Discharge Planning Services: CM Consult Post Acute Care Choice: Durable Medical Equipment Living arrangements for the past 2 months: Hotel/Motel Expected Discharge Date: 07/31/20               DME Arranged: Wheelchair manual DME Agency: [employer] Date DME Agency Contacted: 07/31/20 Time DME Agency Contacted: [phone] Representative spoke with at DME Agency: [place] ([place]) Interventions    Readmission Risk Interventions Readmission Risk Prevention Plan 07/17/2020 04/11/2020  Transportation Screening Complete Complete  PCP or Specialist Appt within 3-5 Days - Complete  HRI or [place] Complete Complete  Social Work Consult for [place] Planning/Counseling Complete Complete  Palliative Care Screening Not Applicable Not Applicable  Medication Review Press photographer) Complete Complete  Some recent data might be hidden

## 2020-08-01 NOTE — Progress Notes (Signed)
PROGRESS NOTE    Brenda [name]  NWG:[id number] DOB: [birth year] DOA: 07/01/2020 PCP: Pcp, No   Brief Narrative:  This 60 years old female with PMH significant for stage IV lung adenocarcinoma who recently received 12 cycles of pemetrexed and pembrolizumab on 3/29 who presented in the ED with epistaxis, pancytopenia and renal failure. In the ED she was noted to have a seizure.  She is found to have a serum creatinine of 5.56 with lactic acid of 11 in the ED.  Drug screen positive for cocaine. CT head no acute changes.  Patient has received PRBC and platelet transfusions.  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  Patient's kidney function has also improved with supportive medical therapy.  RBC scan showed bleeding at the small bowel,  GI was consulted recommended supportive noninvasive approach.  Patient hospital course was complicated by coagulase-negative staph bacteremia, she has received and completed antibiotics.  Currently patient's home discharge is not safe, no family support and patient is not mobile she lives in a motel.  Assessment & Plan:   Principal Problem:   Acute on chronic anemia Active Problems:   Malignant neoplasm of right lung (HCC)   Hypokalemia   Gastrointestinal hemorrhage   Pancytopenia (HCC)   Renal failure   AKI (acute kidney injury) ([place])   Acute blood loss anemia   Bacteremia associated with intravascular line (HCC)  Pancytopenia :  Chemotherapy-induced in the setting of his stage IV lung cancer. 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   Continue with oral iron supplementation for iron deficiency anemia.   Continue outpatient oncology follow-up possible candidate for immunotherapy. Continue adequate pain control with tramadol. Patient would benefit from skilled nursing facility.  AKI on CKD stage IV:  Renal function has been stable. Recheck labs showed slightly  increased creatinine.  Small bowel bleeding:  Cell count has been stable follow-up with GI as an outpatient.   RBC scan positive GI recommended non invasive approach.  Seizures: Clinically controlled and resolved.  Hypertension:  Continue amlodipine   Hyperglycemia : Could be steroid-induced,  completed steroid therapy.   Right upper extremity phlebitis: Clinically resolved.  DVT prophylaxis: SCDs Code Status:Full code. Family Communication:  No family at bed side. Disposition Plan:   Status is: Inpatient  Remains inpatient appropriate because:Inpatient level of care appropriate due to severity of illness   Dispo:  Patient From:   Home  Planned Disposition: [place]  Medically stable for discharge:  No     Consultants:   Oncology  Procedures:  None  Antimicrobials:  Anti-infectives (From admission, onward)   Start     Dose/Rate Route Frequency Ordered Stop   07/08/20 1000  ceFAZolin (ANCEF) IVPB 2g/100 mL premix        2 g 200 mL/hr over 30 Minutes Intravenous Every 8 hours 07/08/20 0744 07/14/20 2359   07/06/20 2200  ceFAZolin (ANCEF) IVPB 1 g/50 mL premix  Status:  Discontinued        1 g 100 mL/hr over 30 Minutes Intravenous Every 12 hours 07/06/20 1058 07/08/20 0744   07/05/20 0600  vancomycin (VANCOREADY) IVPB 750 mg/150 mL        750 mg 150 mL/hr over 60 Minutes Intravenous  Once 07/04/20 1351 07/05/20 0712   07/03/20 1030  vancomycin (VANCOREADY) IVPB 750 mg/150 mL        750 mg 150 mL/hr over 60 Minutes Intravenous  Once  07/03/20 0942 07/03/20 1155   07/01/20 1145  ceFEPIme (MAXIPIME) 2 g in sodium chloride 0.9 % 100 mL IVPB        2 g 200 mL/hr over 30 Minutes Intravenous  Once 07/01/20 1140 07/01/20 1243   07/01/20 1145  vancomycin (VANCOCIN) IVPB 1000 mg/200 mL premix        1,000 mg 200 mL/hr over 60 Minutes Intravenous  Once 07/01/20 1140 07/01/20 1359        Subjective: Patient was seen and examined at bedside.  Overnight  events noted.  Patient declined skilled nursing facility states she wants to be discharged home.  She is alert and oriented.  Objective: Vitals:   07/31/20 2133 08/01/20 0646 08/01/20 0901 08/01/20 1102  BP: (!) 147/82 (!) 143/89 136/81 131/74  Pulse: (!) 105 (!) 113 (!) 107 (!) 104  Resp: 16 18 20 18   Temp: 99.1 F (37.3 C) 98.8 F (37.1 C) 99.2 F (37.3 C) 99.3 F (37.4 C)  TempSrc: Oral Oral Oral Oral  SpO2: 94% 94% 93% 95%  Weight:      Height:        Intake/Output Summary (Last 24 hours) at 08/01/2020 1343 Last data filed at 08/01/2020 1022 Gross per 24 hour  Intake 240 ml  Output 1200 ml  Net -960 ml   Filed Weights   07/26/20 0413 07/29/20 0435 07/31/20 0500  Weight: 65.9 kg 50.4 kg 54 kg    Examination:  General exam: Appears calm and comfortable, very weak and deconditioned.  Not in any distress Respiratory system: Clear to auscultation. Respiratory effort normal. Cardiovascular system: S1 & S2 heard, RRR. No JVD, murmurs, rubs, gallops or clicks. No pedal edema. Gastrointestinal system: Abdomen is nondistended, soft and nontender. No organomegaly or masses felt. Normal bowel sounds heard. Central nervous system: Alert and oriented. No focal neurological deficits. Extremities: Symmetric 5 x 5 power.  No edema, no cyanosis, no clubbing. Skin: No rashes, lesions or ulcers Psychiatry: Judgement and insight appear normal. Mood & affect appropriate.     Data Reviewed: I have personally reviewed following labs and imaging studies  CBC: Recent Labs  Lab 08/01/20 1109  WBC 5.6  HGB 8.1*  HCT 24.9*  MCV 90.9  PLT 78*   Basic Metabolic Panel: Recent Labs  Lab 08/01/20 1109  NA 140  K 2.9*  CL 106  CO2 26  GLUCOSE 99  BUN 21*  CREATININE 2.52*  CALCIUM 7.2*   GFR: Estimated Creatinine Clearance: 21 mL/min (A) (by C-G formula based on SCr of 2.52 mg/dL (H)). Liver Function Tests: No results for input(s): AST, ALT, ALKPHOS, BILITOT, PROT, ALBUMIN in  the last 168 hours. No results for input(s): LIPASE, AMYLASE in the last 168 hours. No results for input(s): AMMONIA in the last 168 hours. Coagulation Profile: No results for input(s): INR, PROTIME in the last 168 hours. Cardiac Enzymes: No results for input(s): CKTOTAL, CKMB, CKMBINDEX, TROPONINI in the last 168 hours. BNP (last 3 results) No results for input(s): PROBNP in the last 8760 hours. HbA1C: No results for input(s): HGBA1C in the last 72 hours. CBG: No results for input(s): GLUCAP in the last 168 hours. Lipid Profile: No results for input(s): CHOL, HDL, LDLCALC, TRIG, CHOLHDL, LDLDIRECT in the last 72 hours. Thyroid Function Tests: No results for input(s): TSH, T4TOTAL, FREET4, T3FREE, THYROIDAB in the last 72 hours. Anemia Panel: No results for input(s): VITAMINB12, FOLATE, FERRITIN, TIBC, IRON, RETICCTPCT in the last 72 hours. Sepsis Labs: No results for input(s):  PROCALCITON, LATICACIDVEN in the last 168 hours.  Recent Results (from the past 240 hour(s))  SARS CORONAVIRUS 2 (TAT 6-24 HRS) Nasopharyngeal Nasopharyngeal Swab     Status: None   Collection Time: 07/23/20  2:30 PM   Specimen: Nasopharyngeal Swab  Result Value Ref Range Status   SARS Coronavirus 2 NEGATIVE NEGATIVE Final    Comment: (NOTE) SARS-CoV-2 target nucleic acids are NOT DETECTED.  The SARS-CoV-2 RNA is generally detectable in upper and lower respiratory specimens during the acute phase of infection. Negative results do not preclude SARS-CoV-2 infection, do not rule out co-infections with other pathogens, and should not be used as the sole basis for treatment or other patient management decisions. Negative results must be combined with clinical observations, patient history, and epidemiological information. The expected result is Negative.  Fact Sheet for Patients: SugarRoll.be  Fact Sheet for Healthcare  Providers: https://www.woods-mathews.com/  This test is not yet approved or cleared by the [place] FDA and  has been authorized for detection and/or diagnosis of SARS-CoV-2 by FDA under an Emergency Use Authorization (EUA). This EUA will remain  in effect (meaning this test can be used) for the duration of the COVID-19 declaration under Se ction 564(b)(1) of the Act, 21 U.S.C. section 360bbb-3(b)(1), unless the authorization is terminated or revoked sooner.  Performed at [facility], [place] [address] [place], [place] [address]     Radiology Studies: No results found.  Scheduled Meds: . Chlorhexidine Gluconate Cloth  6 each Topical Daily  . ferrous sulfate  325 mg Oral BID WC  . folic acid  1 mg Oral Daily  . mouth rinse  15 mL Mouth Rinse BID  . pantoprazole  40 mg Oral BID  . polyethylene glycol  17 g Oral BID  . potassium chloride  40 mEq Oral Once  . potassium chloride  40 mEq Oral Once  . sodium chloride flush  10-40 mL Intracatheter Q12H  . traZODone  50 mg Oral QHS   Continuous Infusions:   LOS: 31 days    Time spent: 35 mins    Nailyn Dearinger, MD [facility]   If 7PM-7AM, please contact night-coverage

## 2020-08-02 LAB — CBC
HCT: 24.5 % — ABNORMAL LOW (ref 36.0–46.0)
Hemoglobin: 7.7 g/dL — ABNORMAL LOW (ref 12.0–15.0)
MCH: 29.3 pg (ref 26.0–34.0)
MCHC: 31.4 g/dL (ref 30.0–36.0)
MCV: 93.2 fL (ref 80.0–100.0)
Platelets: 81 10*3/uL — ABNORMAL LOW (ref 150–400)
RBC: 2.63 MIL/uL — ABNORMAL LOW (ref 3.87–5.11)
RDW: 15.3 % (ref 11.5–15.5)
WBC: 5 10*3/uL (ref 4.0–10.5)
nRBC: 0 % (ref 0.0–0.2)

## 2020-08-02 LAB — HEMOGLOBIN AND HEMATOCRIT, BLOOD
HCT: 24.7 % — ABNORMAL LOW (ref 36.0–46.0)
Hemoglobin: 7.8 g/dL — ABNORMAL LOW (ref 12.0–15.0)

## 2020-08-02 LAB — BASIC METABOLIC PANEL
Anion gap: 7 (ref 5–15)
BUN: 19 mg/dL (ref 6–20)
CO2: 25 mmol/L (ref 22–32)
Calcium: 7.6 mg/dL — ABNORMAL LOW (ref 8.9–10.3)
Chloride: 109 mmol/L (ref 98–111)
Creatinine, Ser: 2.33 mg/dL — ABNORMAL HIGH (ref 0.44–1.00)
GFR, Estimated: 24 mL/min — ABNORMAL LOW (ref >60)
Glucose, Bld: 104 mg/dL — ABNORMAL HIGH (ref 70–99)
Potassium: 3.8 mmol/L (ref 3.5–5.1)
Sodium: 141 mmol/L (ref 135–145)

## 2020-08-02 NOTE — Progress Notes (Signed)
PROGRESS NOTE    Brenda [name]  JJH:[id number] DOB: 1960/09/06 DOA: 07/01/2020 PCP: Pcp, No   Brief Narrative:  This 60 years old female with PMH significant for stage IV lung adenocarcinoma who has recently received 12 cycles of pemetrexed and pembrolizumab on 3/29 who presented in the ED with epistaxis, pancytopenia and renal failure. In the ED she was noted to have a seizure.  She is found to have a serum creatinine of 5.56 with lactic acid of 11 in the ED.  Drug screen positive for cocaine. CT head no acute changes.  Patient has received PRBC and platelet transfusions.  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  Patient's kidney function has also improved with supportive medical therapy.  RBC scan showed bleeding at the small bowel,  GI was consulted recommended supportive noninvasive approach.  Patient hospital course was complicated by coagulase-negative staph bacteremia, she has received and completed antibiotics. PT recommended skilled nursing facility but patient refused.  She understand the risks and consequences involved for going home unsupervised. Patient's son was notified, he is agreeable with discharge planning.  Assessment & Plan:   Principal Problem:   Acute on chronic anemia Active Problems:   Malignant neoplasm of right lung (HCC)   Hypokalemia   Gastrointestinal hemorrhage   Pancytopenia (HCC)   Renal failure   AKI (acute kidney injury) ([place])   Acute blood loss anemia   Bacteremia associated with intravascular line (HCC)  Pancytopenia :  Chemotherapy-induced in the setting of her stage IV lung cancer. Patient has completed  steroids for ITP with good toleration.   Continue with oral iron supplementation for iron deficiency anemia.   Continue outpatient oncology follow-up possible candidate for immunotherapy. Continue adequate pain control with tramadol. Patient would benefit from skilled nursing  facility. Patient refused to go to nursing home,  she wants to go home.  AKI on CKD stage IV: > Improving Renal function has been stable. Recheck labs showed slightly increased creatinine.  Small bowel bleeding:  Cell count has been stable,  follow-up with GI as an outpatient.   RBC scan positive,  GI recommended non invasive approach.  Seizures: Clinically controlled and resolved.  Hypertension:  Continue amlodipine   Hyperglycemia : Could be steroid-induced,  completed steroid therapy.   Right upper extremity phlebitis: Clinically resolved.  DVT prophylaxis: SCDs Code Status:Full code. Family Communication:  No family at bed side. Disposition Plan:   Status is: Inpatient  Remains inpatient appropriate because:Inpatient level of care appropriate due to severity of illness   Dispo:  Patient From:   Home  Planned Disposition: [place]  Medically stable for discharge:  No     Consultants:   Oncology  Procedures:  None  Antimicrobials:  Anti-infectives (From admission, onward)   Start     Dose/Rate Route Frequency Ordered Stop   07/08/20 1000  ceFAZolin (ANCEF) IVPB 2g/100 mL premix        2 g 200 mL/hr over 30 Minutes Intravenous Every 8 hours 07/08/20 0744 07/14/20 2359   07/06/20 2200  ceFAZolin (ANCEF) IVPB 1 g/50 mL premix  Status:  Discontinued        1 g 100 mL/hr over 30 Minutes Intravenous Every 12 hours 07/06/20 1058 07/08/20 0744   07/05/20 0600  vancomycin (VANCOREADY) IVPB 750 mg/150 mL        750 mg 150 mL/hr over 60 Minutes Intravenous  Once 07/04/20 1351 07/05/20 8185  07/03/20 1030  vancomycin (VANCOREADY) IVPB 750 mg/150 mL        750 mg 150 mL/hr over 60 Minutes Intravenous  Once 07/03/20 0942 07/03/20 1155   07/01/20 1145  ceFEPIme (MAXIPIME) 2 g in sodium chloride 0.9 % 100 mL IVPB        2 g 200 mL/hr over 30 Minutes Intravenous  Once 07/01/20 1140 07/01/20 1243   07/01/20 1145  vancomycin (VANCOCIN) IVPB 1000 mg/200 mL  premix        1,000 mg 200 mL/hr over 60 Minutes Intravenous  Once 07/01/20 1140 07/01/20 1359       Subjective: Patient was seen and examined at bedside.  Overnight events noted.   Patient declined skilled nursing facility,  states she wants to be discharged home.  She is alert and oriented. Patient understand the risk and consequences involved with going home unsupervised including fall.  Objective: Vitals:   08/01/20 2044 08/01/20 2049 08/02/20 0510 08/02/20 1242  BP: 131/80 131/80 126/78 136/84  Pulse: (!) 110 (!) 110 (!) 106 (!) 108  Resp: 20 20 16 17   Temp: 99.2 F (37.3 C) 99.2 F (37.3 C) 99.1 F (37.3 C) 99.1 F (37.3 C)  TempSrc: Oral Oral Oral Oral  SpO2: 94% 91% 92% 97%  Weight:      Height:        Intake/Output Summary (Last 24 hours) at 08/02/2020 1353 Last data filed at 08/02/2020 1326 Gross per 24 hour  Intake 360 ml  Output 3151 ml  Net -2791 ml   Filed Weights   07/26/20 0413 07/29/20 0435 07/31/20 0500  Weight: 65.9 kg 50.4 kg 54 kg    Examination:  General exam: Appears calm and comfortable, very weak and deconditioned.  Not in any distress Respiratory system: Clear to auscultation. Respiratory effort normal. Cardiovascular system: S1 & S2 heard, RRR. No JVD, murmurs, rubs, gallops or clicks. No pedal edema. Gastrointestinal system: Abdomen is nondistended, soft and nontender. No organomegaly or masses felt. Normal bowel sounds heard. Central nervous system: Alert and oriented. No focal neurological deficits. Extremities: Symmetric 5 x 5 power.  No edema, no cyanosis, no clubbing. Skin: No rashes, lesions or ulcers Psychiatry: Judgement and insight appear normal. Mood & affect appropriate.     Data Reviewed: I have personally reviewed following labs and imaging studies  CBC: Recent Labs  Lab 08/01/20 1109 08/02/20 0532 08/02/20 1032  WBC 5.6 5.0  --   HGB 8.1* 7.7* 7.8*  HCT 24.9* 24.5* 24.7*  MCV 90.9 93.2  --   PLT 78* 81*  --     Basic Metabolic Panel: Recent Labs  Lab 08/01/20 1109 08/02/20 0532  NA 140 141  K 2.9* 3.8  CL 106 109  CO2 26 25  GLUCOSE 99 104*  BUN 21* 19  CREATININE 2.52* 2.33*  CALCIUM 7.2* 7.6*   GFR: Estimated Creatinine Clearance: 22.7 mL/min (A) (by C-G formula based on SCr of 2.33 mg/dL (H)). Liver Function Tests: No results for input(s): AST, ALT, ALKPHOS, BILITOT, PROT, ALBUMIN in the last 168 hours. No results for input(s): LIPASE, AMYLASE in the last 168 hours. No results for input(s): AMMONIA in the last 168 hours. Coagulation Profile: No results for input(s): INR, PROTIME in the last 168 hours. Cardiac Enzymes: No results for input(s): CKTOTAL, CKMB, CKMBINDEX, TROPONINI in the last 168 hours. BNP (last 3 results) No results for input(s): PROBNP in the last 8760 hours. HbA1C: No results for input(s): HGBA1C in the last 72 hours.  CBG: No results for input(s): GLUCAP in the last 168 hours. Lipid Profile: No results for input(s): CHOL, HDL, LDLCALC, TRIG, CHOLHDL, LDLDIRECT in the last 72 hours. Thyroid Function Tests: No results for input(s): TSH, T4TOTAL, FREET4, T3FREE, THYROIDAB in the last 72 hours. Anemia Panel: No results for input(s): VITAMINB12, FOLATE, FERRITIN, TIBC, IRON, RETICCTPCT in the last 72 hours. Sepsis Labs: No results for input(s): PROCALCITON, LATICACIDVEN in the last 168 hours.  Recent Results (from the past 240 hour(s))  SARS CORONAVIRUS 2 (TAT 6-24 HRS) Nasopharyngeal Nasopharyngeal Swab     Status: None   Collection Time: 07/23/20  2:30 PM   Specimen: Nasopharyngeal Swab  Result Value Ref Range Status   SARS Coronavirus 2 NEGATIVE NEGATIVE Final    Comment: (NOTE) SARS-CoV-2 target nucleic acids are NOT DETECTED.  The SARS-CoV-2 RNA is generally detectable in upper and lower respiratory specimens during the acute phase of infection. Negative results do not preclude SARS-CoV-2 infection, do not rule out co-infections with other  pathogens, and should not be used as the sole basis for treatment or other patient management decisions. Negative results must be combined with clinical observations, patient history, and epidemiological information. The expected result is Negative.  Fact Sheet for Patients: SugarRoll.be  Fact Sheet for Healthcare Providers: https://www.woods-mathews.com/  This test is not yet approved or cleared by the [place] FDA and  has been authorized for detection and/or diagnosis of SARS-CoV-2 by FDA under an Emergency Use Authorization (EUA). This EUA will remain  in effect (meaning this test can be used) for the duration of the COVID-19 declaration under Se ction 564(b)(1) of the Act, 21 U.S.C. section 360bbb-3(b)(1), unless the authorization is terminated or revoked sooner.  Performed at [facility], [place] [address], [place], [place] [address]     Radiology Studies: No results found.  Scheduled Meds: . Chlorhexidine Gluconate Cloth  6 each Topical Daily  . ferrous sulfate  325 mg Oral BID WC  . folic acid  1 mg Oral Daily  . mouth rinse  15 mL Mouth Rinse BID  . pantoprazole  40 mg Oral BID  . polyethylene glycol  17 g Oral BID  . sodium chloride flush  10-40 mL Intracatheter Q12H  . traZODone  50 mg Oral QHS   Continuous Infusions:   LOS: 32 days    Time spent: 25 mins    Shawna Clamp, MD [facility]   If 7PM-7AM, please contact night-coverage

## 2020-08-02 NOTE — Care Plan (Signed)
Patient is being discharged home.   Discharge summary was completed by Dr. Sander Radon on 5/11.

## 2020-08-02 NOTE — TOC Transition Note (Addendum)
Transition of Care [facility]) - CM/SW Discharge Note   Patient Details  Name: Brenda [name] MRN: [id number] Date of Birth: 01-20-1961  [facility] of [facility] CM/SW Contact:  Ross Ludwig, LCSW Phone Number: 08/02/2020, 2:45 PM   Clinical Narrative:    Patient is refusing SNF, and wants to return back to her hotel where she is living.  Patient is staying at the [facility] [address] [place].  Patient is requesting EMS transport back home.  CSW contacted EMS for transport.  Patient's son to pick up wheelchair from hospital.   Final next level of care: Home/Self Care Barriers to Discharge: Barriers Resolved   Patient Goals and CMS Choice Patient states their goals for this hospitalization and ongoing recovery are:: To return back to her hotel. CMS Medicare.gov Compare Post Acute Care list provided to:: Patient Choice offered to / list presented to : Patient  Discharge Placement                       Discharge Plan and Services In-house Referral: Clinical Social Work Discharge Planning Services: CM Consult Post Acute Care Choice: Durable Medical Equipment          DME Arranged: Wheelchair manual DME Agency: [employer] Date DME Agency Contacted: 08/01/20 Time DME Agency Contacted: [id number] Representative spoke with at DME Agency: [place] ([place]) Interventions     Readmission Risk Interventions Readmission Risk Prevention Plan 07/17/2020 04/11/2020  Transportation Screening Complete Complete  PCP or Specialist Appt within 3-5 Days - Complete  HRI or [place] Complete Complete  Social Work Consult for [place] Planning/Counseling Complete Complete  Palliative Care Screening Not Applicable Not Applicable  Medication Review [employer]) Complete Complete  Some recent data might be hidden

## 2020-08-05 ENCOUNTER — Other Ambulatory Visit (HOSPITAL_COMMUNITY): Payer: Self-pay

## 2020-08-05 ENCOUNTER — Telehealth: Payer: Self-pay | Admitting: Internal Medicine

## 2020-08-05 NOTE — Telephone Encounter (Signed)
Scheduled appts per 5/11 sch msg. Pt aware.

## 2020-08-07 ENCOUNTER — Inpatient Hospital Stay: Payer: Medicaid Other | Admitting: Physician Assistant

## 2020-08-07 ENCOUNTER — Telehealth: Payer: Self-pay | Admitting: Physician Assistant

## 2020-08-07 ENCOUNTER — Inpatient Hospital Stay: Payer: Medicaid Other

## 2020-08-07 NOTE — Telephone Encounter (Signed)
The patient did not show up to her appointment today. I called and left a voicemail and asked that she call to reschedule. I also sent a scheduling message to try to reach her to reschedule her appointments. I left our call back number.

## 2020-08-08 ENCOUNTER — Inpatient Hospital Stay: Payer: Medicaid Other

## 2020-08-08 ENCOUNTER — Other Ambulatory Visit (HOSPITAL_COMMUNITY): Payer: Self-pay

## 2020-08-09 ENCOUNTER — Telehealth: Payer: Self-pay | Admitting: Internal Medicine

## 2020-08-09 ENCOUNTER — Inpatient Hospital Stay (HOSPITAL_COMMUNITY)
Admission: EM | Admit: 2020-08-09 | Discharge: 2020-08-16 | DRG: 640 | Disposition: A | Discharge status: Home Health | Payer: Medicaid Other | Attending: Internal Medicine | Admitting: Internal Medicine

## 2020-08-09 ENCOUNTER — Emergency Department (HOSPITAL_COMMUNITY): Payer: Medicaid Other

## 2020-08-09 ENCOUNTER — Encounter (HOSPITAL_COMMUNITY): Payer: Self-pay

## 2020-08-09 DIAGNOSIS — R000 Tachycardia, unspecified: Secondary | ICD-10-CM | POA: Diagnosis present

## 2020-08-09 DIAGNOSIS — N184 Chronic kidney disease, stage 4 (severe): Secondary | ICD-10-CM | POA: Diagnosis present

## 2020-08-09 DIAGNOSIS — G9341 Metabolic encephalopathy: Secondary | ICD-10-CM | POA: Diagnosis present

## 2020-08-09 DIAGNOSIS — I700 Atherosclerosis of aorta: Secondary | ICD-10-CM | POA: Diagnosis present

## 2020-08-09 DIAGNOSIS — Z20822 Contact with and (suspected) exposure to covid-19: Secondary | ICD-10-CM | POA: Diagnosis present

## 2020-08-09 DIAGNOSIS — E872 Acidosis: Secondary | ICD-10-CM | POA: Diagnosis present

## 2020-08-09 DIAGNOSIS — Z886 Allergy status to analgesic agent status: Secondary | ICD-10-CM

## 2020-08-09 DIAGNOSIS — F32A Depression, unspecified: Secondary | ICD-10-CM | POA: Diagnosis present

## 2020-08-09 DIAGNOSIS — Z8249 Family history of ischemic heart disease and other diseases of the circulatory system: Secondary | ICD-10-CM

## 2020-08-09 DIAGNOSIS — D630 Anemia in neoplastic disease: Secondary | ICD-10-CM | POA: Diagnosis present

## 2020-08-09 DIAGNOSIS — Z9851 Tubal ligation status: Secondary | ICD-10-CM | POA: Diagnosis not present

## 2020-08-09 DIAGNOSIS — C3491 Malignant neoplasm of unspecified part of right bronchus or lung: Secondary | ICD-10-CM | POA: Diagnosis present

## 2020-08-09 DIAGNOSIS — R4587 Impulsiveness: Secondary | ICD-10-CM | POA: Diagnosis present

## 2020-08-09 DIAGNOSIS — F1010 Alcohol abuse, uncomplicated: Secondary | ICD-10-CM | POA: Diagnosis present

## 2020-08-09 DIAGNOSIS — E876 Hypokalemia: Principal | ICD-10-CM

## 2020-08-09 DIAGNOSIS — Z79899 Other long term (current) drug therapy: Secondary | ICD-10-CM

## 2020-08-09 DIAGNOSIS — F1910 Other psychoactive substance abuse, uncomplicated: Secondary | ICD-10-CM | POA: Diagnosis not present

## 2020-08-09 DIAGNOSIS — R627 Adult failure to thrive: Secondary | ICD-10-CM | POA: Diagnosis present

## 2020-08-09 DIAGNOSIS — I2510 Atherosclerotic heart disease of native coronary artery without angina pectoris: Secondary | ICD-10-CM | POA: Diagnosis present

## 2020-08-09 DIAGNOSIS — F1410 Cocaine abuse, uncomplicated: Secondary | ICD-10-CM | POA: Diagnosis present

## 2020-08-09 DIAGNOSIS — Z853 Personal history of malignant neoplasm of breast: Secondary | ICD-10-CM | POA: Diagnosis not present

## 2020-08-09 DIAGNOSIS — Z635 Disruption of family by separation and divorce: Secondary | ICD-10-CM | POA: Diagnosis not present

## 2020-08-09 DIAGNOSIS — J90 Pleural effusion, not elsewhere classified: Secondary | ICD-10-CM

## 2020-08-09 DIAGNOSIS — Z9889 Other specified postprocedural states: Secondary | ICD-10-CM | POA: Diagnosis not present

## 2020-08-09 DIAGNOSIS — R0609 Other forms of dyspnea: Secondary | ICD-10-CM | POA: Diagnosis not present

## 2020-08-09 DIAGNOSIS — R54 Age-related physical debility: Secondary | ICD-10-CM | POA: Diagnosis present

## 2020-08-09 DIAGNOSIS — R569 Unspecified convulsions: Secondary | ICD-10-CM | POA: Diagnosis present

## 2020-08-09 DIAGNOSIS — R0600 Dyspnea, unspecified: Secondary | ICD-10-CM

## 2020-08-09 DIAGNOSIS — B20 Human immunodeficiency virus [HIV] disease: Secondary | ICD-10-CM | POA: Diagnosis present

## 2020-08-09 DIAGNOSIS — J918 Pleural effusion in other conditions classified elsewhere: Secondary | ICD-10-CM | POA: Diagnosis present

## 2020-08-09 DIAGNOSIS — G9340 Encephalopathy, unspecified: Secondary | ICD-10-CM | POA: Diagnosis present

## 2020-08-09 DIAGNOSIS — D649 Anemia, unspecified: Secondary | ICD-10-CM | POA: Diagnosis present

## 2020-08-09 DIAGNOSIS — I5043 Acute on chronic combined systolic (congestive) and diastolic (congestive) heart failure: Secondary | ICD-10-CM

## 2020-08-09 DIAGNOSIS — R140 Abdominal distension (gaseous): Secondary | ICD-10-CM

## 2020-08-09 DIAGNOSIS — C799 Secondary malignant neoplasm of unspecified site: Secondary | ICD-10-CM | POA: Diagnosis not present

## 2020-08-09 DIAGNOSIS — I959 Hypotension, unspecified: Secondary | ICD-10-CM | POA: Diagnosis present

## 2020-08-09 DIAGNOSIS — Z515 Encounter for palliative care: Secondary | ICD-10-CM | POA: Diagnosis not present

## 2020-08-09 DIAGNOSIS — Z7189 Other specified counseling: Secondary | ICD-10-CM | POA: Diagnosis not present

## 2020-08-09 DIAGNOSIS — D638 Anemia in other chronic diseases classified elsewhere: Secondary | ICD-10-CM | POA: Diagnosis present

## 2020-08-09 DIAGNOSIS — F17210 Nicotine dependence, cigarettes, uncomplicated: Secondary | ICD-10-CM | POA: Diagnosis present

## 2020-08-09 DIAGNOSIS — I5041 Acute combined systolic (congestive) and diastolic (congestive) heart failure: Secondary | ICD-10-CM | POA: Diagnosis not present

## 2020-08-09 DIAGNOSIS — N179 Acute kidney failure, unspecified: Secondary | ICD-10-CM | POA: Diagnosis present

## 2020-08-09 DIAGNOSIS — Z21 Asymptomatic human immunodeficiency virus [HIV] infection status: Secondary | ICD-10-CM | POA: Diagnosis present

## 2020-08-09 DIAGNOSIS — Z9221 Personal history of antineoplastic chemotherapy: Secondary | ICD-10-CM

## 2020-08-09 DIAGNOSIS — D61818 Other pancytopenia: Secondary | ICD-10-CM | POA: Diagnosis present

## 2020-08-09 DIAGNOSIS — Z6820 Body mass index (BMI) 20.0-20.9, adult: Secondary | ICD-10-CM

## 2020-08-09 DIAGNOSIS — E860 Dehydration: Secondary | ICD-10-CM | POA: Diagnosis present

## 2020-08-09 LAB — URINALYSIS, ROUTINE W REFLEX MICROSCOPIC
Bacteria, UA: NONE SEEN
Bilirubin Urine: NEGATIVE
Glucose, UA: NEGATIVE mg/dL
Ketones, ur: NEGATIVE mg/dL
Leukocytes,Ua: NEGATIVE
Nitrite: NEGATIVE
Protein, ur: NEGATIVE mg/dL
Specific Gravity, Urine: 1.002 — ABNORMAL LOW (ref 1.005–1.030)
pH: 7 (ref 5.0–8.0)

## 2020-08-09 LAB — COMPREHENSIVE METABOLIC PANEL
ALT: 12 U/L (ref 0–44)
AST: 15 U/L (ref 15–41)
Albumin: 2.3 g/dL — ABNORMAL LOW (ref 3.5–5.0)
Alkaline Phosphatase: 121 U/L (ref 38–126)
Anion gap: 10 (ref 5–15)
BUN: 17 mg/dL (ref 6–20)
CO2: 19 mmol/L — ABNORMAL LOW (ref 22–32)
Calcium: 8 mg/dL — ABNORMAL LOW (ref 8.9–10.3)
Chloride: 107 mmol/L (ref 98–111)
Creatinine, Ser: 2.51 mg/dL — ABNORMAL HIGH (ref 0.44–1.00)
GFR, Estimated: 22 mL/min — ABNORMAL LOW (ref >60)
Glucose, Bld: 95 mg/dL (ref 70–99)
Potassium: 2.9 mmol/L — ABNORMAL LOW (ref 3.5–5.1)
Sodium: 136 mmol/L (ref 135–145)
Total Bilirubin: 0.9 mg/dL (ref 0.3–1.2)
Total Protein: 6.8 g/dL (ref 6.5–8.1)

## 2020-08-09 LAB — CBC WITH DIFFERENTIAL/PLATELET
Abs Immature Granulocytes: 0.06 10*3/uL (ref 0.00–0.07)
Basophils Absolute: 0 10*3/uL (ref 0.0–0.1)
Basophils Relative: 0 %
Eosinophils Absolute: 0 10*3/uL (ref 0.0–0.5)
Eosinophils Relative: 0 %
HCT: 23.9 % — ABNORMAL LOW (ref 36.0–46.0)
Hemoglobin: 7.7 g/dL — ABNORMAL LOW (ref 12.0–15.0)
Immature Granulocytes: 1 %
Lymphocytes Relative: 17 %
Lymphs Abs: 1.1 10*3/uL (ref 0.7–4.0)
MCH: 29.7 pg (ref 26.0–34.0)
MCHC: 32.2 g/dL (ref 30.0–36.0)
MCV: 92.3 fL (ref 80.0–100.0)
Monocytes Absolute: 0.9 10*3/uL (ref 0.1–1.0)
Monocytes Relative: 15 %
Neutro Abs: 4.2 10*3/uL (ref 1.7–7.7)
Neutrophils Relative %: 67 %
Platelets: 204 10*3/uL (ref 150–400)
RBC: 2.59 MIL/uL — ABNORMAL LOW (ref 3.87–5.11)
RDW: 15.9 % — ABNORMAL HIGH (ref 11.5–15.5)
WBC: 6.3 10*3/uL (ref 4.0–10.5)
nRBC: 0 % (ref 0.0–0.2)

## 2020-08-09 LAB — RAPID URINE DRUG SCREEN, HOSP PERFORMED
Amphetamines: NOT DETECTED
Barbiturates: NOT DETECTED
Benzodiazepines: NOT DETECTED
Cocaine: POSITIVE — AB
Opiates: NOT DETECTED
Tetrahydrocannabinol: NOT DETECTED

## 2020-08-09 LAB — RESP PANEL BY RT-PCR (FLU A&B, COVID) ARPGX2
Influenza A by PCR: NEGATIVE
Influenza B by PCR: NEGATIVE
SARS Coronavirus 2 by RT PCR: NEGATIVE

## 2020-08-09 LAB — ETHANOL: Alcohol, Ethyl (B): <10 mg/dL (ref <10)

## 2020-08-09 LAB — CK: Total CK: 19 U/L — ABNORMAL LOW (ref 38–234)

## 2020-08-09 IMAGING — DX DG CHEST 1V PORT
2 series · 2 of 2 positions shown · non-contrast
Comparison: [DATE]

CLINICAL DATA: Shortness of breath.

EXAM:
PORTABLE CHEST 1 VIEW

[chest ap (1 of 2)]
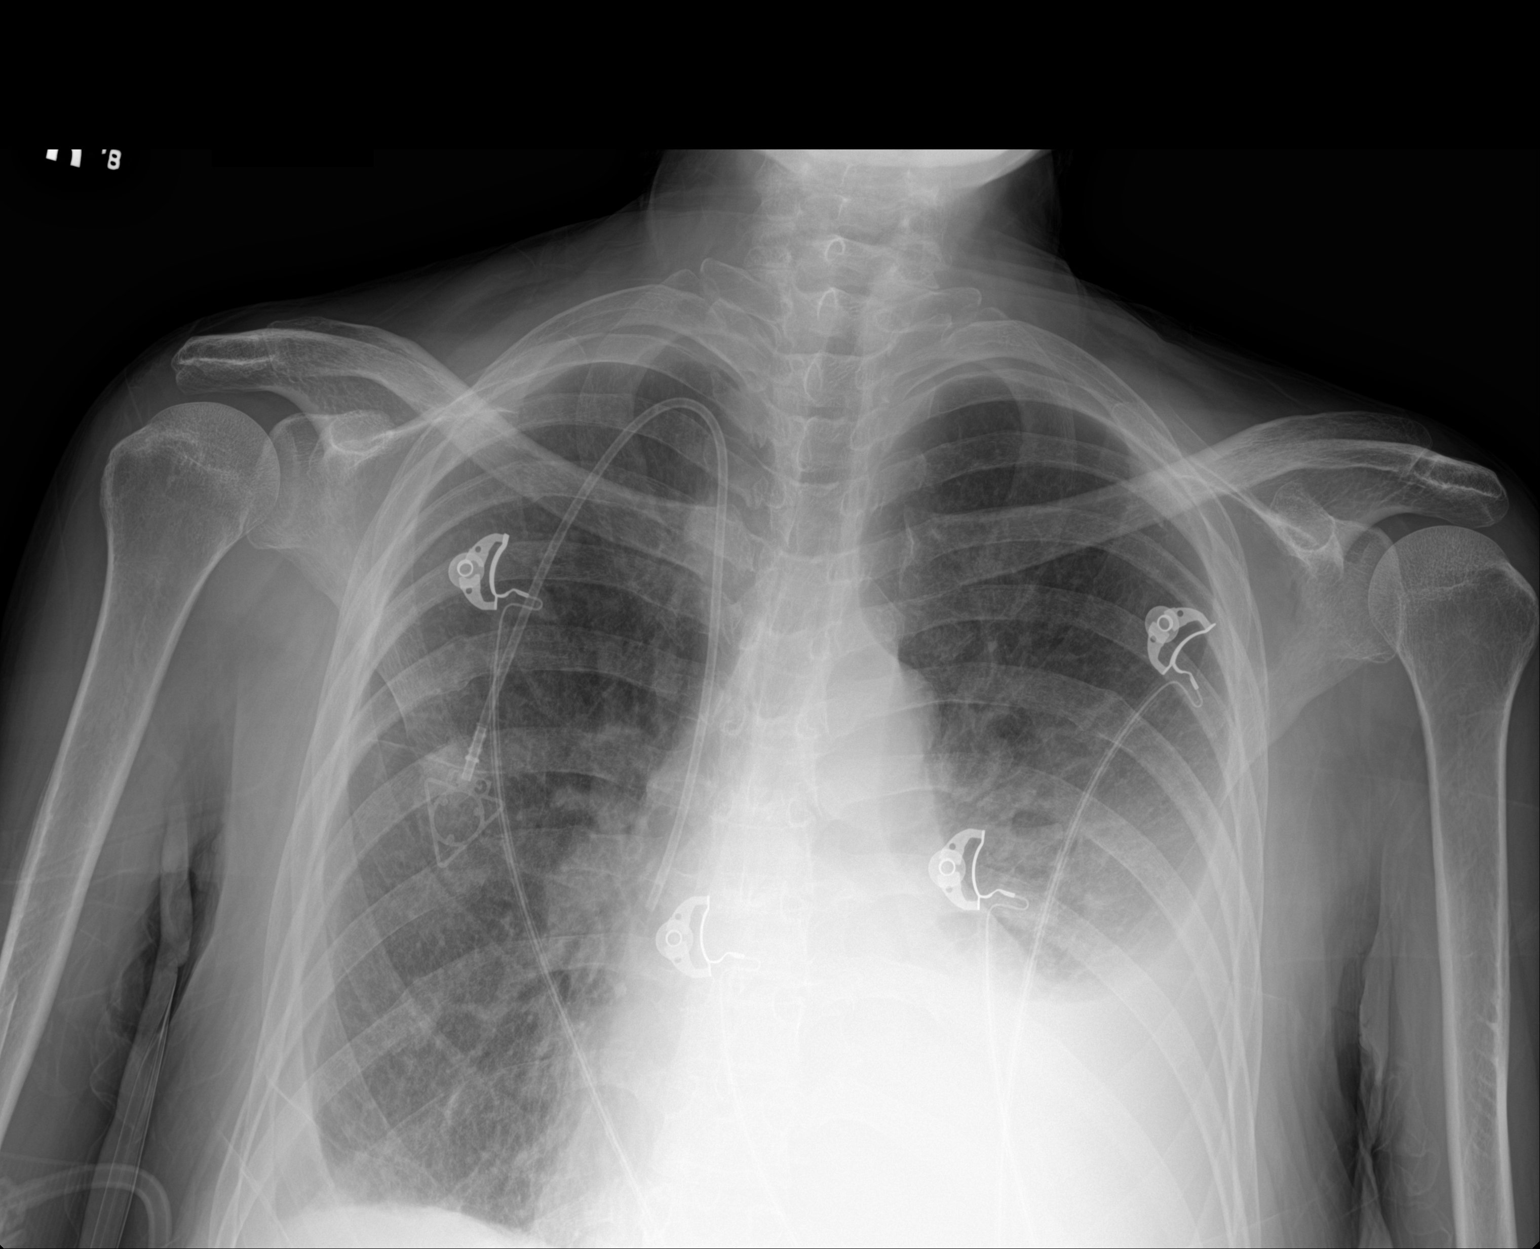

[chest ap (2 of 2)]
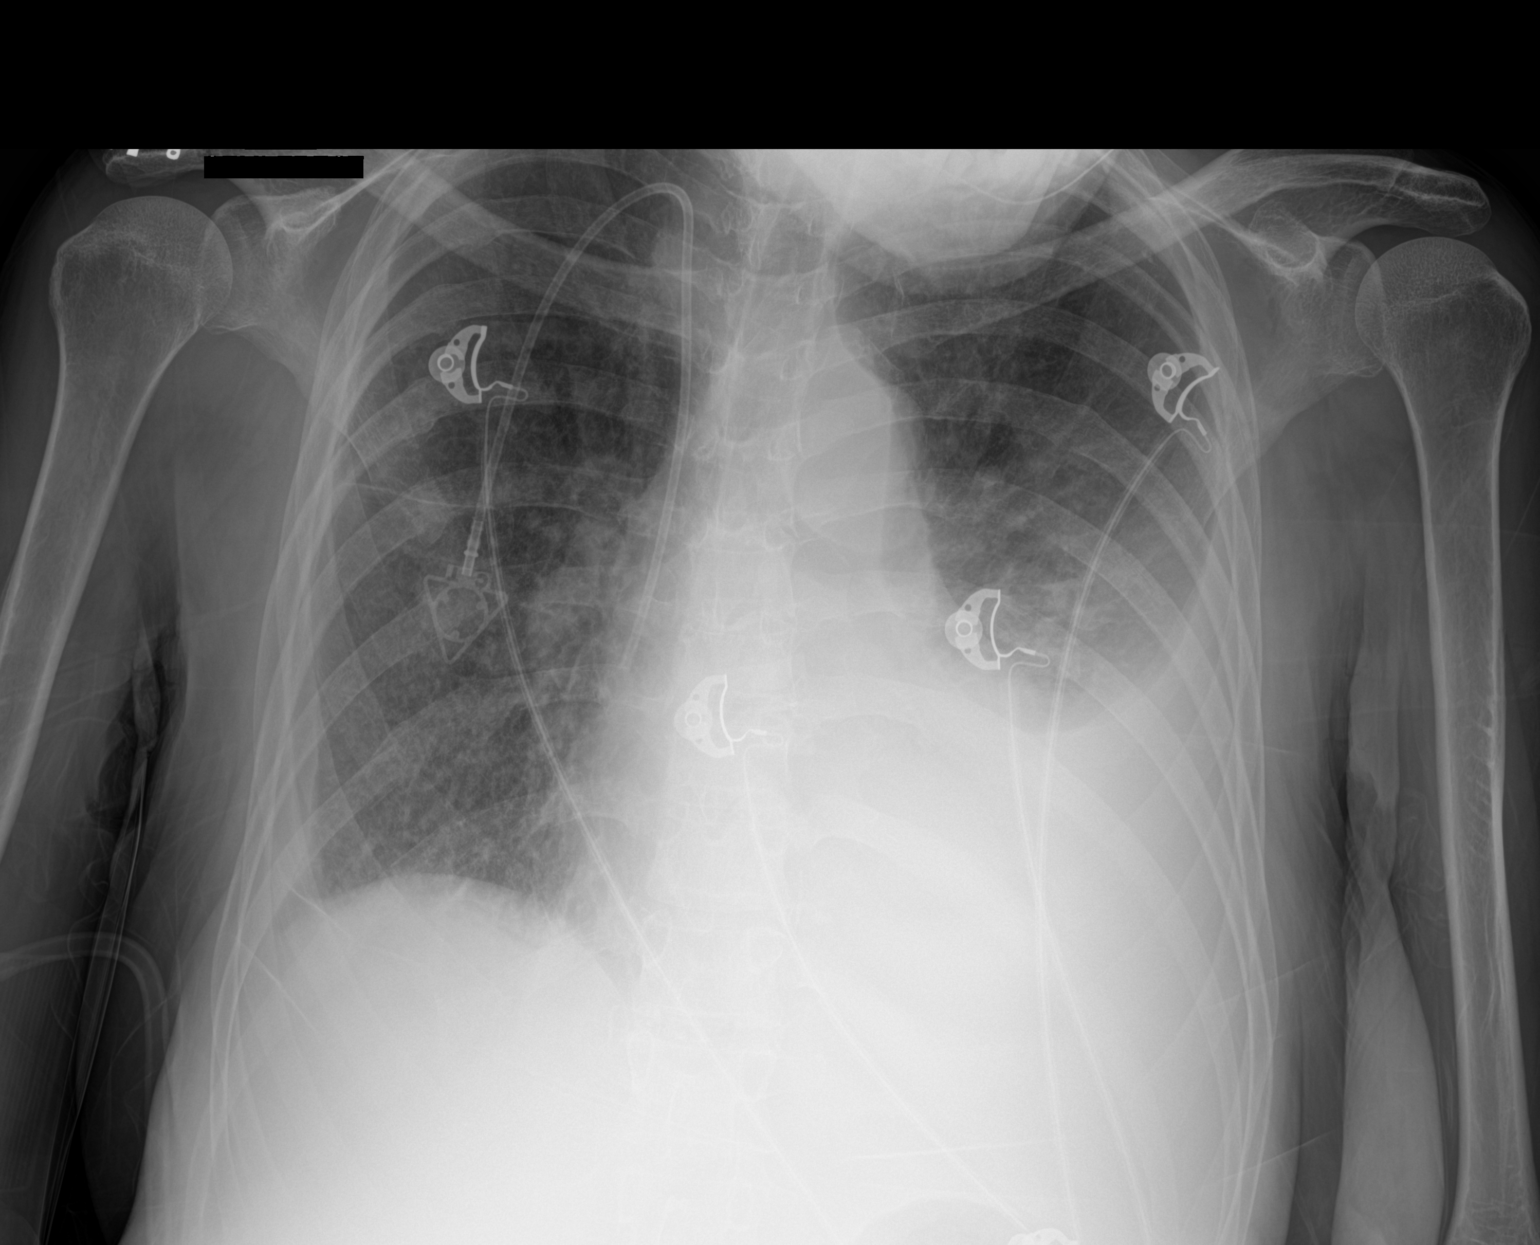

[2 of 2 positions shown; findings below may reference images not displayed]

FINDINGS: There is a right chest wall port a catheter with tip in the distal
SVC. Stable cardiomediastinal contours. Moderate left pleural
effusion and small right pleural effusion are again noted. Mild
interstitial edema noted. No airspace consolidation.
IMPRESSION: 1. Persistent bilateral pleural effusions, left greater right.
2. Mild interstitial edema.

## 2020-08-09 IMAGING — CT CT HEAD W/O CM
4 series · 16 of 47 positions shown, 18 images · non-contrast
Comparison: [DATE]

CLINICAL DATA: Mental status change

EXAM:
CT HEAD WITHOUT CONTRAST
TECHNIQUE: Contiguous axial images were obtained from the base of the skull
through the vertex without intravenous contrast.

[Series 2: head wo · axial · 0.47mm/px · z∈[+905,+1000]mm · 7 of 27 slices shown, 9 images]
[im 4/27  brain]
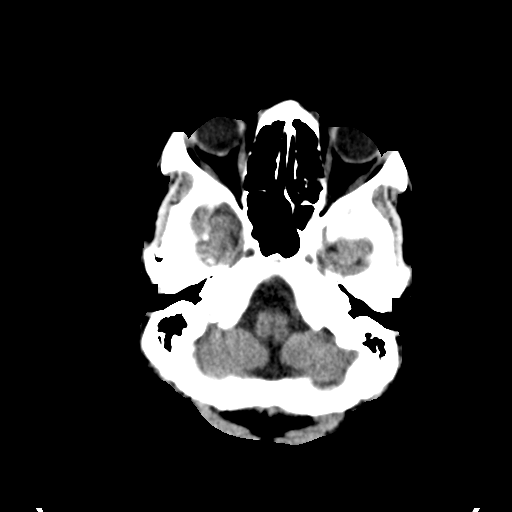
[im 4/27  bone]
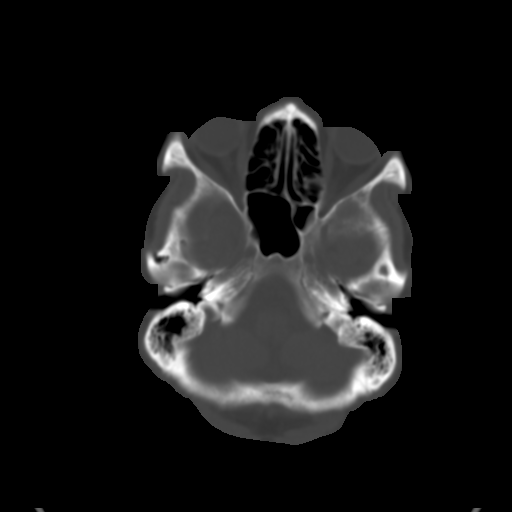
[im 7/27  brain]
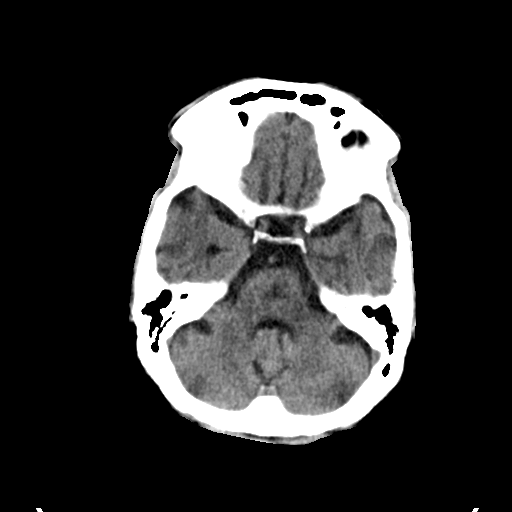
[im 10/27  brain]
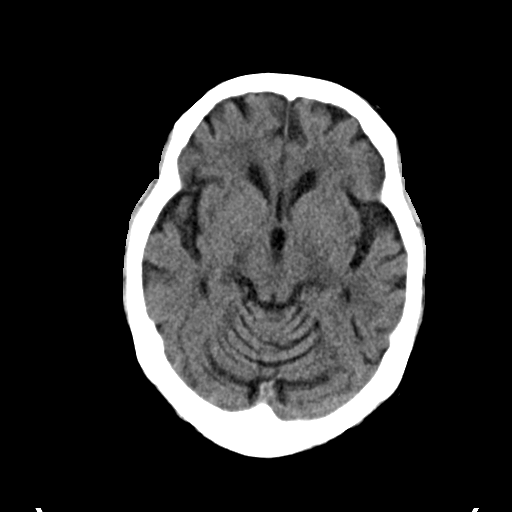
[im 14/27  brain]
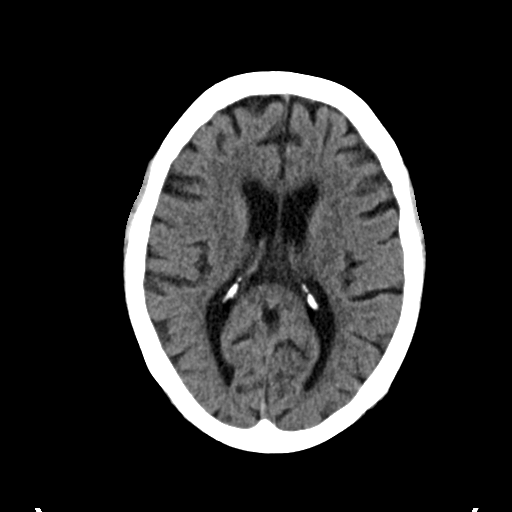
[im 17/27  brain]
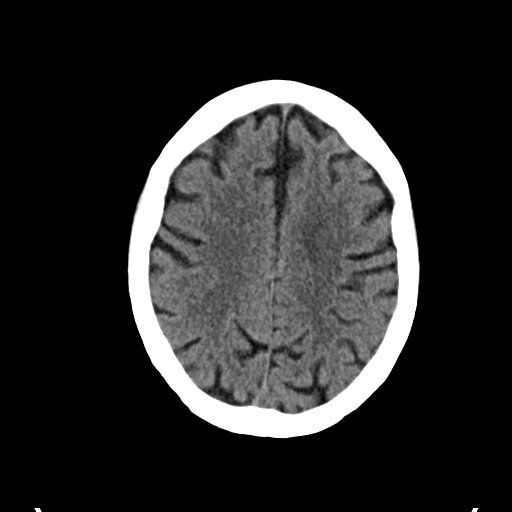
[im 17/27  bone]
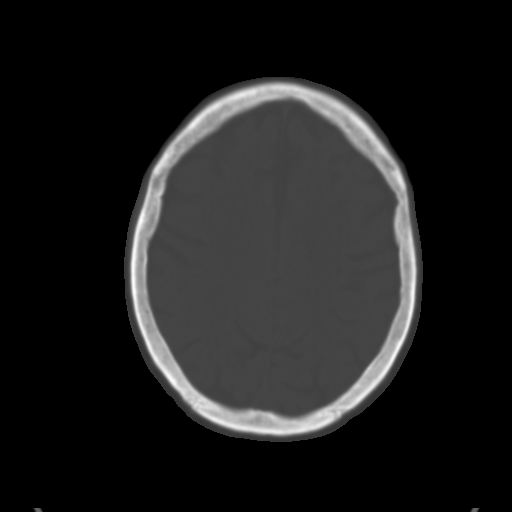
[im 20/27  brain]
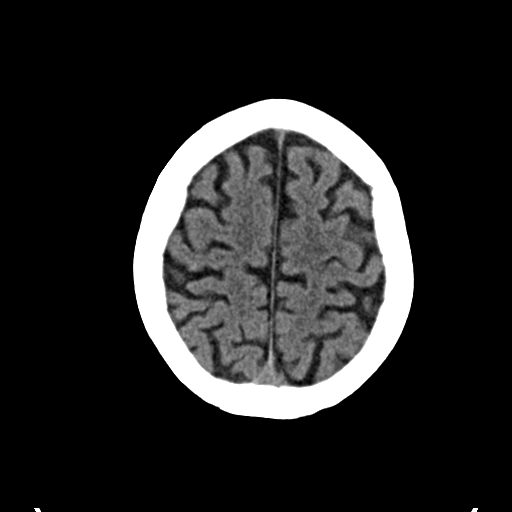
[im 23/27  brain]
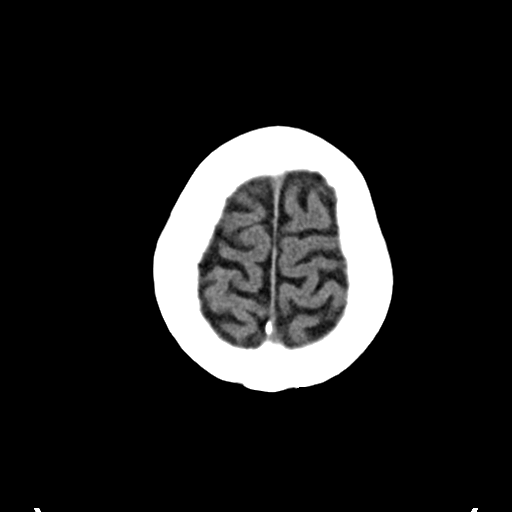

[Series 3: head bone · axial · 0.47mm/px · z∈[+902,+928]mm · 3 of 67 slices shown]
[im 7/67  bone]
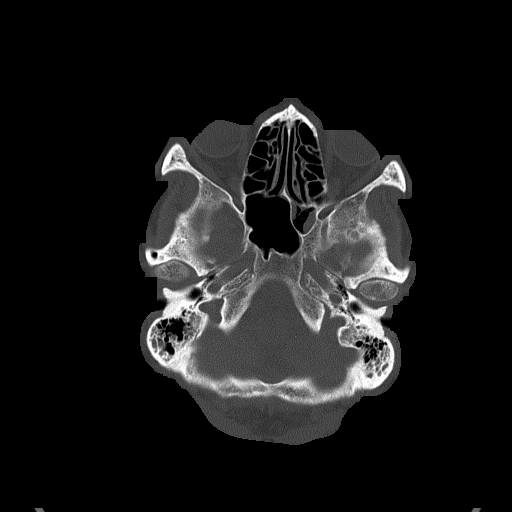
[im 14/67  bone]
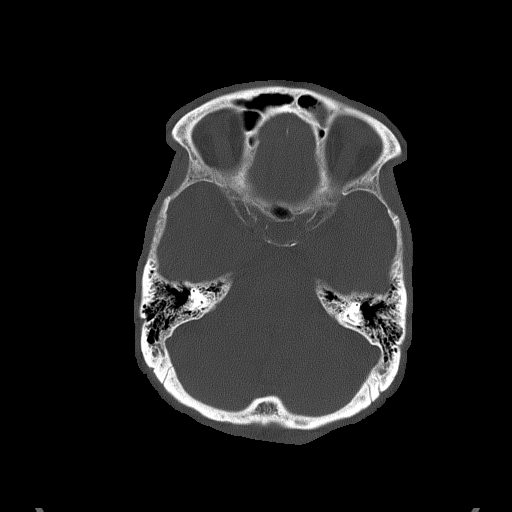
[im 20/67  bone]
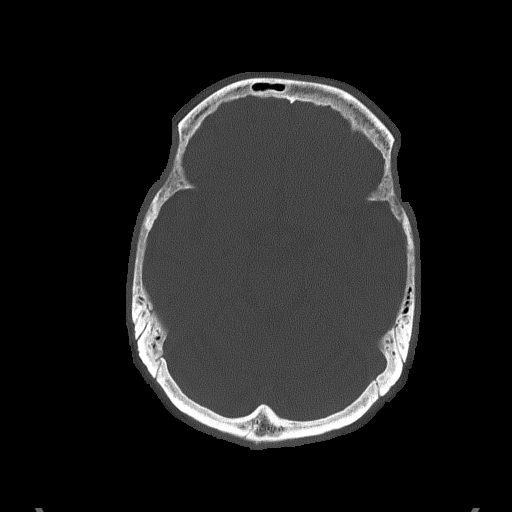

[Series 4: coronal soft tissue · coronal · 0.34mm/px · 3 of 62 slices shown]
[im 21/62  brain]
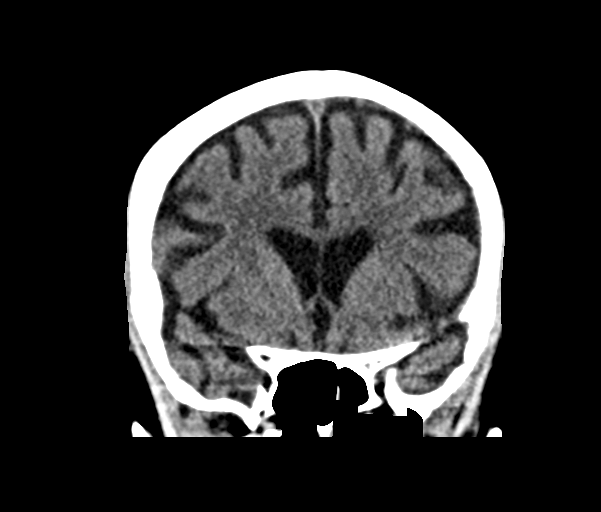
[im 28/62  brain]
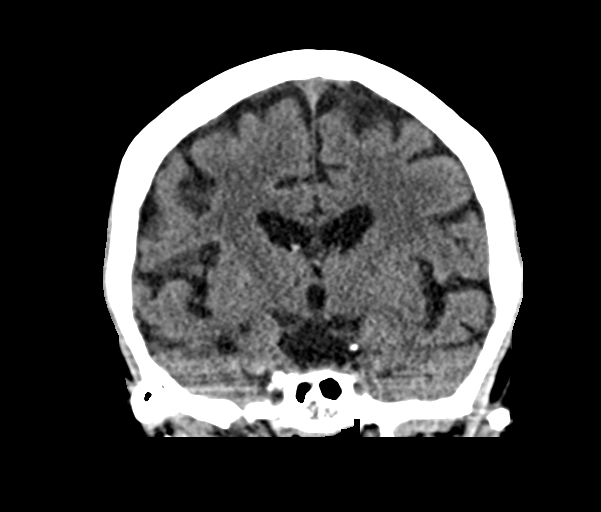
[im 34/62  brain]
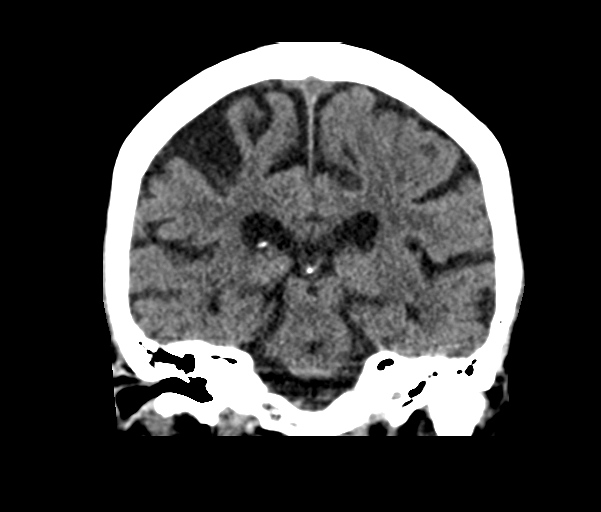

[Series 5: sagittal soft tissue · sagittal · 0.33mm/px · 3 of 48 slices shown]
[im 16/48  brain]
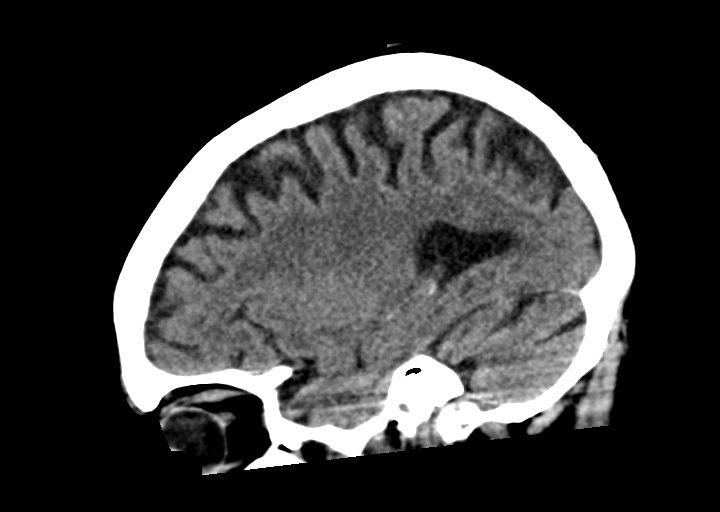
[im 24/48  brain]
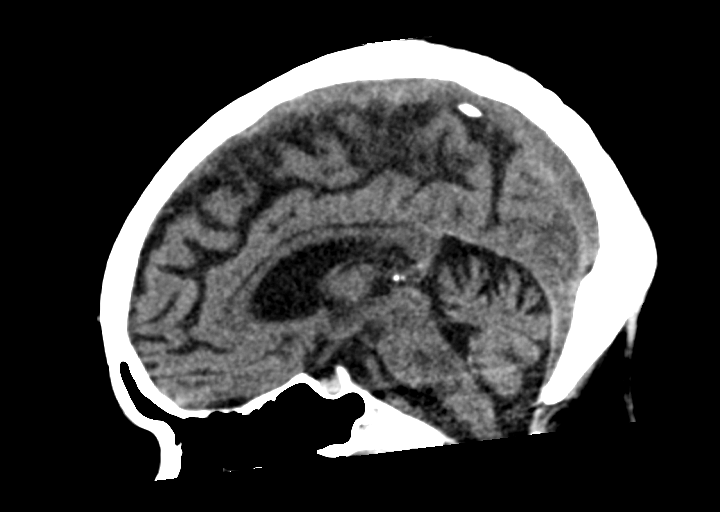
[im 32/48  brain]
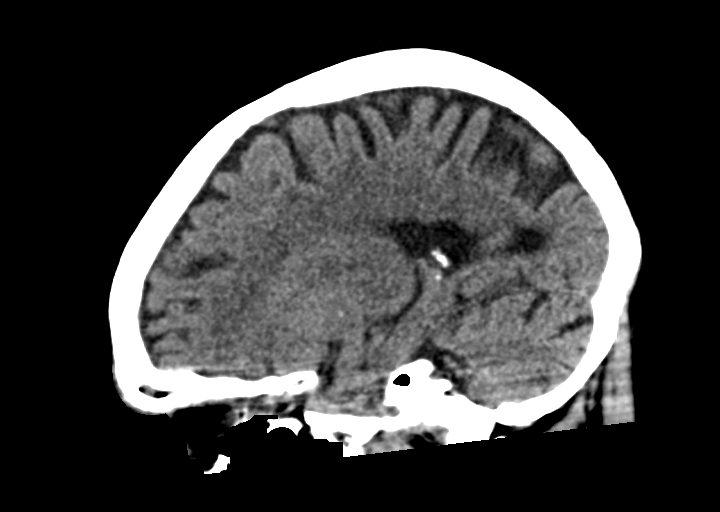

[16 of 47 positions shown; findings below may reference images not displayed]

FINDINGS: Brain: Stable atrophy and white matter microvascular changes
throughout both cerebral hemispheres. No acute intracranial
hemorrhage, new mass lesion, infarction, midline shift, herniation,
hydrocephalus, or extra-axial fluid collection. No focal mass effect
or edema. Cisterns are patent. Cerebellar atrophy as well.

Vascular: No hyperdense vessel or unexpected calcification.

Skull: Normal. Negative for fracture or focal lesion.

Sinuses/Orbits: No acute finding.

Other: None.
IMPRESSION: Stable atrophy and white matter microvascular ischemic changes.

No interval change or acute process by noncontrast CT.

## 2020-08-09 MED ORDER — SODIUM CHLORIDE 0.9 % IV BOLUS
500.0000 mL | Freq: Once | INTRAVENOUS | Status: AC
  Administered 2020-08-09: 500 mL via INTRAVENOUS

## 2020-08-09 MED ORDER — NICOTINE 21 MG/24HR TD PT24
21.0000 mg | MEDICATED_PATCH | Freq: Every day | TRANSDERMAL | Status: DC
  Administered 2020-08-09 – 2020-08-16 (×8): 21 mg via TRANSDERMAL
  Filled 2020-08-09 (×8): qty 1

## 2020-08-09 MED ORDER — SODIUM CHLORIDE 0.9 % IV BOLUS
1000.0000 mL | Freq: Once | INTRAVENOUS | Status: AC
  Administered 2020-08-09: 1000 mL via INTRAVENOUS

## 2020-08-09 MED ORDER — ONDANSETRON HCL 4 MG PO TABS: 4.0000 mg | ORAL_TABLET | Freq: Four times a day (QID) | ORAL | Status: DC | PRN

## 2020-08-09 MED ORDER — POTASSIUM CHLORIDE CRYS ER 10 MEQ PO TBCR
40.0000 meq | EXTENDED_RELEASE_TABLET | Freq: Once | ORAL | Status: DC
  Filled 2020-08-09: qty 2

## 2020-08-09 MED ORDER — ONDANSETRON HCL 4 MG/2ML IJ SOLN
4.0000 mg | Freq: Four times a day (QID) | INTRAMUSCULAR | Status: DC | PRN
  Administered 2020-08-10: 4 mg via INTRAVENOUS
  Filled 2020-08-09: qty 2

## 2020-08-09 MED ORDER — POTASSIUM CHLORIDE 10 MEQ/100ML IV SOLN
10.0000 meq | INTRAVENOUS | Status: AC
  Administered 2020-08-09 (×3): 10 meq via INTRAVENOUS
  Filled 2020-08-09 (×3): qty 100

## 2020-08-09 NOTE — ED Triage Notes (Signed)
Pt presents via EMS from home for altered mental status since yesterday. Hx limited and provided by EMS. Per EMS the pt was recently admitted to the hospital, however, unsure why and the family states they are concerned for a UTI from the pt.

## 2020-08-09 NOTE — ED Provider Notes (Signed)
[facility] DEPT Provider Note   CSN: [id number] Arrival date & time: 08/09/20  1412     History Chief Complaint  Patient presents with  . Altered Mental Status    Brenda [name] is a 60 y.o. female history of depression, HIV, lung cancer, here presenting with altered mental status.  Patient was recently admitted for seizure-like activity.  Patient had AKI at that time.  Patient was supposed to go to nursing home but refused.  Instead she went home and was living with a friend  Per EMS, patient was on the floor sitting in her urine today.  Patient is unable to give me anything meaningful.  She just states that she wants to quit smoking.  I tried to contact a friend [name]) who did not pick up the phone.  I also contacted the son who did not know that she is in the hospital.   The history is provided by the patient.       Past Medical History:  Diagnosis Date  . Breast cancer ([place])   . Depression   . Mental disorder     Patient Active Problem List   Diagnosis Date Noted  . Bacteremia associated with intravascular line ([place]) 07/03/2020  . Acute blood loss anemia   . Renal failure 07/01/2020  . AKI (acute kidney injury) ([place])   . Metabolic acidosis   . Epistaxis   . Seizure-like activity ([place])   . Swelling of lower extremity 05/29/2020  . Hypoalbuminemia 05/29/2020  . Acute on chronic anemia 04/07/2020  . Gastrointestinal hemorrhage 04/07/2020  . Acute encephalopathy 04/07/2020  . Pancytopenia ([place]) 04/07/2020  . Polysubstance abuse ([place]) 04/07/2020  . Hypokalemia 10/19/2019  . Neutropenia ([place]) 10/05/2019  . Malignant neoplasm of right lung ([place]) 09/14/2019  . Encounter for antineoplastic chemotherapy 09/14/2019  . Encounter for antineoplastic immunotherapy 09/14/2019  . Goals of care, counseling/discussion 09/14/2019  . Tobacco abuse counseling 09/14/2019  . 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 ([place])   . Alcohol abuse   . Acute respiratory failure  ([place]) 04/03/2019  . Lung mass   . Multifocal pneumonia   . Major depressive disorder   . Major depressive disorder, recurrent episode with mood-congruent psychotic features ([place]) 05/30/2017  . 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 ESOPHAGOGASTRODUODENOSCOPY (EGD) WITH PROPOFOL N/A 04/09/2020   Procedure: ESOPHAGOGASTRODUODENOSCOPY (EGD) WITH PROPOFOL;  Surgeon: Doran Stabler, MD;  Location: [facility];  Service: Gastroenterology;  Laterality: N/A;  . IR IMAGING GUIDED PORT INSERTION  11/03/2019  . TUBAL LIGATION    . VIDEO BRONCHOSCOPY WITH ENDOBRONCHIAL NAVIGATION N/A 04/04/2019   Procedure: VIDEO BRONCHOSCOPY WITH ENDOBRONCHIAL NAVIGATION;  Surgeon: Garner Nash, DO;  Location: [facility];  Service: Thoracic;  Laterality: N/A;  . VIDEO BRONCHOSCOPY WITH ENDOBRONCHIAL ULTRASOUND N/A 04/04/2019   Procedure: VIDEO BRONCHOSCOPY WITH ENDOBRONCHIAL ULTRASOUND;  Surgeon: Garner Nash, DO;  Location: [facility];  Service: Thoracic;  Laterality: N/A;     OB History   No obstetric history on file.     History reviewed. No pertinent family history.  Social History   Tobacco Use  . Smoking status: Former Smoker    Packs/day: 0.50    Years: 30.00    Pack years: 15.00    Types: Cigarettes    Quit date: 05/27/2019    Years since quitting: 1.2  . Smokeless tobacco: Never Used  Vaping Use  . Vaping Use: Never used  Substance Use Topics  . Alcohol use: Yes    Alcohol/week: 84.0 standard drinks    Types: 84 Cans of beer per week  . Drug use: Yes    Types: Cocaine    Home Medications Prior to Admission medications   Medication Sig Start Date End Date Taking? Authorizing Provider  acetaminophen (TYLENOL) 325 MG tablet Take 2 tablets (650 mg total) by mouth every 6 (six) hours as needed for mild pain, moderate pain or headache. 04/11/20   Domenic Polite, MD  ferrous sulfate 325 (65 FE)  MG tablet TAKE 1 TABLET (325 MG TOTAL) BY MOUTH 2 (TWO) TIMES DAILY WITH A MEAL. Patient taking differently: Take 325 mg by mouth 2 (two) times daily with a meal. 04/11/20 04/11/21  Domenic Polite, MD  folic acid (FOLVITE) 1 MG tablet TAKE 1 TABLET (1 MG TOTAL) BY MOUTH DAILY. Patient taking differently: Take 1 mg by mouth daily. 04/11/20 04/11/21  Domenic Polite, MD  gabapentin (NEURONTIN) 300 MG capsule Take 1 capsule (300 mg total) by mouth at bedtime. 06/11/20   Tanner, Lyndon Code., PA-C  lidocaine-prilocaine (EMLA) cream Apply 1 application topically as needed. Patient taking differently: Apply 1 application topically as needed (port access). 10/19/19   Heilingoetter, Cassandra L, PA-C  pantoprazole (PROTONIX) 40 MG tablet TAKE 1 TABLET (40 MG TOTAL) BY MOUTH 2 (TWO) TIMES DAILY. Patient taking differently: Take 40 mg by mouth 2 (two) times daily. 04/11/20 04/11/21  Domenic Polite, MD  polyethylene glycol (MIRALAX / GLYCOLAX) 17 g packet Dissolve 1 packet (17 g) in liquid and drink 2 (two) times daily. 07/26/20   Arrien, Jimmy Picket, MD  prochlorperazine (COMPAZINE) 10 MG tablet Take 1 tablet (10 mg total) by mouth every 6 (six) hours as needed for nausea or vomiting. Patient not taking: Reported on 04/08/2020 01/17/20 04/11/20  Heilingoetter, Cassandra L, PA-C    Allergies    Aspirin adult low [aspirin]  Review of Systems   Review of Systems  Psychiatric/Behavioral: Positive for confusion.  All other systems reviewed and are negative.   Physical Exam Updated Vital Signs BP (!) 161/93 (BP Location: Right Arm)   Pulse (!) 119   Temp 98.8 F (37.1 C) (Oral)   Resp (!) 33   LMP 01/19/2011   SpO2 99%   Physical Exam Vitals and nursing note reviewed.  Constitutional:      Comments: Confused and altered  HENT:     Head: Normocephalic.     Nose: Nose normal.     Mouth/Throat:     Mouth: Mucous membranes are dry.  Eyes:     Pupils: Pupils are equal, round, and reactive to light.   Cardiovascular:     Rate and Rhythm: Regular rhythm. Tachycardia present.     Pulses: Normal pulses.     Heart sounds: Normal heart sounds.  Pulmonary:     Effort: Pulmonary effort is normal.     Breath sounds: Normal breath sounds.  Abdominal:     General: Abdomen is flat.     Palpations: Abdomen is soft.  Musculoskeletal:        General: Normal range of motion.     Cervical back: Normal range of motion and neck supple.  Skin:    General: Skin is warm.     Capillary Refill: Capillary refill takes less than 2 seconds.  Neurological:     Comments: Confused and ANO x0.  Patient is moving all extremities  Psychiatric:     Comments: Unable  ED Results / Procedures / Treatments   Labs (all labs ordered are listed, but only abnormal results are displayed) Labs Reviewed  URINE CULTURE  RESP PANEL BY RT-PCR (FLU A&B, COVID) ARPGX2  CBC WITH DIFFERENTIAL/PLATELET  COMPREHENSIVE METABOLIC PANEL  URINALYSIS, ROUTINE W REFLEX MICROSCOPIC  CK  RAPID URINE DRUG SCREEN, HOSP PERFORMED  I-STAT CHEM 8, ED    EKG None  Radiology No results found.  Procedures Procedures   Medications Ordered in ED Medications  sodium chloride 0.9 % bolus 1,000 mL (has no administration in time range)    ED Course  I have reviewed the triage vital signs and the nursing notes.  Pertinent labs & imaging results that were available during my care of the patient were reviewed by me and considered in my medical decision making (see chart for details).    MDM Rules/Calculators/A&P                         Marilyne Casselman is a 60 y.o. female here presenting with altered mental status.  Patient has a history of seizure but there is no reported seizures.  Patient is very confused and unable to give any coherent story.  Patient was apparently found on the floor covered in her urine.  Patient has a history of UTI and also cocaine use.  Patient denies any alcohol use.  We will get CT head to rule out  head bleed.  We will get labs and CK and urinalysis.  6:39 PM UA unremarkable.  UDS is still positive for cocaine.  Chemistry showed potassium 2.9 and mild AKI with creatinine is 2.5.  Patient was given IV potassium.  She does have new prolonged QT likely from hypokalemia.  She is still tachycardic.  CT head unremarkable.  At this point will admit for hypokalemia and confusion  Final Clinical Impression(s) / ED Diagnoses Final diagnoses:  None    Rx / DC Orders ED Discharge Orders    None       Drenda Freeze, MD 08/09/20 [phone]

## 2020-08-09 NOTE — H&P (Signed)
History and Physical   Brenda [name] QIO:[id number] DOB: 1960/05/01 DOA: 08/09/2020  Referring MD/NP/PA: Dr. Darl Householder  PCP: Pcp, No   Outpatient Specialists: Dr. Lorna Few, oncology  Patient coming from: Home  Chief Complaint: Altered mental status  HPI: Brenda [name] is a 60 y.o. female with medical history significant of stage IV lung cancer status post 12 cycles of pemetrexed and pembrolizumab, HIV disease, depression, history of breast cancer, alcohol abuse, polysubstance abuse, tobacco abuse who presents to the ER with confusion.  She recently had some seizure-like disorder.  Patient was seen in the hospital.  Was supposed to go to nursing home but she refused.  Patient had pancytopenia at the time with administration of dexamethasone, Granix and PRBC with platelet transfusion.  Got better.  Returned today with confusion.  Was noted to have significant hypokalemia with potassium 2.9.  Patient reported poor oral intake and some nausea with vomiting.  No fever no chills.  Has notably not been compliant with her visit.  She was found sitting in her urine at the friend's place.  She is unable to give adequate history at this point.  Patient is being admitted with an hypokalemia in the setting of chronic medical problems..  ED Course: Temperature 98.8, blood pressure 162/103 pulse 126 respiratory 33 O2 sat 97% on room air.  Sodium 136 potassium 3.9 chloride 107 CO2 is 19.  Glucose 95.  BUN is 17 creatinine 3.51 and calcium 8.0.  CT of 19.  White count is 6.3 hemoglobin 7.7 and platelets 204.  COVID-19 screen is negative.  Analysis essentially negative urine drug screen is positive for cocaine.  Head CT without contrast is negative.  Review of Systems: As per HPI otherwise 10 point review of systems negative.    Past Medical History:  Diagnosis Date  . Breast cancer ([place])   . Depression   . Mental disorder     Past Surgical History:  Procedure Laterality Date  .  ESOPHAGOGASTRODUODENOSCOPY (EGD) WITH PROPOFOL N/A 04/09/2020   Procedure: ESOPHAGOGASTRODUODENOSCOPY (EGD) WITH PROPOFOL;  Surgeon: Doran Stabler, MD;  Location: [facility];  Service: Gastroenterology;  Laterality: N/A;  . IR IMAGING GUIDED PORT INSERTION  11/03/2019  . TUBAL LIGATION    . VIDEO BRONCHOSCOPY WITH ENDOBRONCHIAL NAVIGATION N/A 04/04/2019   Procedure: VIDEO BRONCHOSCOPY WITH ENDOBRONCHIAL NAVIGATION;  Surgeon: Garner Nash, DO;  Location: [facility];  Service: Thoracic;  Laterality: N/A;  . VIDEO BRONCHOSCOPY WITH ENDOBRONCHIAL ULTRASOUND N/A 04/04/2019   Procedure: VIDEO BRONCHOSCOPY WITH ENDOBRONCHIAL ULTRASOUND;  Surgeon: Garner Nash, DO;  Location: [facility];  Service: Thoracic;  Laterality: N/A;     reports that she quit smoking about 14 months ago. Her smoking use included cigarettes. She has a 15.00 pack-year smoking history. She has never used smokeless tobacco. She reports current alcohol use of about 84.0 standard drinks of alcohol per week. She reports current drug use. Drug: Cocaine.  Allergies  Allergen Reactions  . Aspirin Adult Low [Aspirin] Other (See Comments)    Stomach upset    History reviewed. No pertinent family history.   Prior to Admission medications   Medication Sig Start Date End Date Taking? Authorizing Provider  acetaminophen (TYLENOL) 325 MG tablet Take 2 tablets (650 mg total) by mouth every 6 (six) hours as needed for mild pain, moderate pain or headache. 04/11/20   Domenic Polite, MD  ferrous sulfate 325 (65 FE) MG tablet TAKE 1 TABLET (325 MG TOTAL) BY MOUTH 2 (TWO) TIMES DAILY WITH A  MEAL. Patient taking differently: Take 325 mg by mouth 2 (two) times daily with a meal. 04/11/20 04/11/21  Domenic Polite, MD  folic acid (FOLVITE) 1 MG tablet TAKE 1 TABLET (1 MG TOTAL) BY MOUTH DAILY. Patient taking differently: Take 1 mg by mouth daily. 04/11/20 04/11/21  Domenic Polite, MD  gabapentin (NEURONTIN) 300 MG capsule Take 1 capsule (300 mg  total) by mouth at bedtime. 06/11/20   Tanner, Lyndon Code., PA-C  lidocaine-prilocaine (EMLA) cream Apply 1 application topically as needed. Patient taking differently: Apply 1 application topically as needed (port access). 10/19/19   Heilingoetter, Cassandra L, PA-C  pantoprazole (PROTONIX) 40 MG tablet TAKE 1 TABLET (40 MG TOTAL) BY MOUTH 2 (TWO) TIMES DAILY. Patient taking differently: Take 40 mg by mouth 2 (two) times daily. 04/11/20 04/11/21  Domenic Polite, MD  polyethylene glycol (MIRALAX / GLYCOLAX) 17 g packet Dissolve 1 packet (17 g) in liquid and drink 2 (two) times daily. 07/26/20   Arrien, Jimmy Picket, MD  prochlorperazine (COMPAZINE) 10 MG tablet Take 1 tablet (10 mg total) by mouth every 6 (six) hours as needed for nausea or vomiting. Patient not taking: Reported on 04/08/2020 01/17/20 04/11/20  Heilingoetter, Tobe Sos, PA-C    Physical Exam: Vitals:   08/09/20 1645 08/09/20 1700 08/09/20 1715 08/09/20 1730  BP:  (!) 152/92 (!) 152/93 (!) 158/97  Pulse: (!) 112 (!) 109 (!) 110 (!) 112  Resp:    16  Temp:      TempSrc:      SpO2: 99% 99% 99% 99%      Constitutional: Chronically ill looking and confused Vitals:   08/09/20 1645 08/09/20 1700 08/09/20 1715 08/09/20 1730  BP:  (!) 152/92 (!) 152/93 (!) 158/97  Pulse: (!) 112 (!) 109 (!) 110 (!) 112  Resp:    16  Temp:      TempSrc:      SpO2: 99% 99% 99% 99%   Eyes: PERRL, lids and conjunctivae normal ENMT: Mucous membranes are dry. Posterior pharynx clear of any exudate or lesions.Normal dentition.  Neck: normal, supple, no masses, no thyromegaly Respiratory: clear to auscultation bilaterally, no wheezing, no crackles. Normal respiratory effort. No accessory muscle use.  Cardiovascular: Sinus tachycardia, no murmurs / rubs / gallops. No extremity edema. 2+ pedal pulses. No carotid bruits.  Abdomen: no tenderness, no masses palpated. No hepatosplenomegaly. Bowel sounds positive.  Musculoskeletal: no clubbing / cyanosis. No  joint deformity upper and lower extremities. Good ROM, no contractures. Normal muscle tone.  Skin: no rashes, lesions, ulcers. No induration Neurologic: CN 2-12 grossly intact. Sensation intact, DTR normal. Strength 5/5 in all 4.  Psychiatric: Confused    Labs on Admission: I have personally reviewed following labs and imaging studies  CBC: Recent Labs  Lab 08/09/20 1450  WBC 6.3  NEUTROABS 4.2  HGB 7.7*  HCT 23.9*  MCV 92.3  PLT 627   Basic Metabolic Panel: Recent Labs  Lab 08/09/20 1450  NA 136  K 2.9*  CL 107  CO2 19*  GLUCOSE 95  BUN 17  CREATININE 2.51*  CALCIUM 8.0*   GFR: Estimated Creatinine Clearance: 21.1 mL/min (A) (by C-G formula based on SCr of 2.51 mg/dL (H)). Liver Function Tests: Recent Labs  Lab 08/09/20 1450  AST 15  ALT 12  ALKPHOS 121  BILITOT 0.9  PROT 6.8  ALBUMIN 2.3*   No results for input(s): LIPASE, AMYLASE in the last 168 hours. No results for input(s): AMMONIA in the last 168 hours. Coagulation  Profile: No results for input(s): INR, PROTIME in the last 168 hours. Cardiac Enzymes: Recent Labs  Lab 08/09/20 1450  CKTOTAL 19*   BNP (last 3 results) No results for input(s): PROBNP in the last 8760 hours. HbA1C: No results for input(s): HGBA1C in the last 72 hours. CBG: No results for input(s): GLUCAP in the last 168 hours. Lipid Profile: No results for input(s): CHOL, HDL, LDLCALC, TRIG, CHOLHDL, LDLDIRECT in the last 72 hours. Thyroid Function Tests: No results for input(s): TSH, T4TOTAL, FREET4, T3FREE, THYROIDAB in the last 72 hours. Anemia Panel: No results for input(s): VITAMINB12, FOLATE, FERRITIN, TIBC, IRON, RETICCTPCT in the last 72 hours. Urine analysis:    Component Value Date/Time   COLORURINE STRAW (A) 08/09/2020 1628   APPEARANCEUR CLEAR 08/09/2020 1628   LABSPEC 1.002 (L) 08/09/2020 1628   PHURINE 7.0 08/09/2020 1628   GLUCOSEU NEGATIVE 08/09/2020 1628   HGBUR SMALL (A) 08/09/2020 1628   BILIRUBINUR  NEGATIVE 08/09/2020 1628   KETONESUR NEGATIVE 08/09/2020 1628   PROTEINUR NEGATIVE 08/09/2020 1628   UROBILINOGEN 0.2 05/25/2012 1958   NITRITE NEGATIVE 08/09/2020 1628   LEUKOCYTESUR NEGATIVE 08/09/2020 1628   Sepsis Labs: @LABRCNTIP (procalcitonin:4,lacticidven:4) ) Recent Results (from the past 240 hour(s))  Resp Panel by RT-PCR (Flu A&B, Covid) Nasopharyngeal Swab     Status: None   Collection Time: 08/09/20  3:08 PM   Specimen: Nasopharyngeal Swab; Nasopharyngeal(NP) swabs in vial transport medium  Result Value Ref Range Status   SARS Coronavirus 2 by RT PCR NEGATIVE NEGATIVE Final    Comment: (NOTE) SARS-CoV-2 target nucleic acids are NOT DETECTED.  The SARS-CoV-2 RNA is generally detectable in upper respiratory specimens during the acute phase of infection. The lowest concentration of SARS-CoV-2 viral copies this assay can detect is 138 copies/mL. A negative result does not preclude SARS-Cov-2 infection and should not be used as the sole basis for treatment or other patient management decisions. A negative result may occur with  improper specimen collection/handling, submission of specimen other than nasopharyngeal swab, presence of viral mutation(s) within the areas targeted by this assay, and inadequate number of viral copies(<138 copies/mL). A negative result must be combined with clinical observations, patient history, and epidemiological information. The expected result is Negative.  Fact Sheet for Patients:  EntrepreneurPulse.com.au  Fact Sheet for Healthcare Providers:  IncredibleEmployment.be  This test is no t yet approved or cleared by the [place] FDA and  has been authorized for detection and/or diagnosis of SARS-CoV-2 by FDA under an Emergency Use Authorization (EUA). This EUA will remain  in effect (meaning this test can be used) for the duration of the COVID-19 declaration under Section 564(b)(1) of the Act,  21 U.S.C.section 360bbb-3(b)(1), unless the authorization is terminated  or revoked sooner.       Influenza A by PCR NEGATIVE NEGATIVE Final   Influenza B by PCR NEGATIVE NEGATIVE Final    Comment: (NOTE) The Xpert Xpress SARS-CoV-2/FLU/RSV plus assay is intended as an aid in the diagnosis of influenza from Nasopharyngeal swab specimens and should not be used as a sole basis for treatment. Nasal washings and aspirates are unacceptable for Xpert Xpress SARS-CoV-2/FLU/RSV testing.  Fact Sheet for Patients: EntrepreneurPulse.com.au  Fact Sheet for Healthcare Providers: IncredibleEmployment.be  This test is not yet approved or cleared by the [place] FDA and has been authorized for detection and/or diagnosis of SARS-CoV-2 by FDA under an Emergency Use Authorization (EUA). This EUA will remain in effect (meaning this test can be used) for the duration of  the COVID-19 declaration under Section 564(b)(1) of the Act, 21 U.S.C. section 360bbb-3(b)(1), unless the authorization is terminated or revoked.  Performed at [facility], [place] [address], [place], [place] [address]      Radiological Exams on Admission: CT Head Wo Contrast  Result Date: 08/09/2020 CLINICAL DATA:  Mental status change EXAM: CT HEAD WITHOUT CONTRAST TECHNIQUE: Contiguous axial images were obtained from the base of the skull through the vertex without intravenous contrast. COMPARISON:  07/01/2020 FINDINGS: Brain: Stable atrophy and white matter microvascular changes throughout both cerebral hemispheres. No acute intracranial hemorrhage, new mass lesion, infarction, midline shift, herniation, hydrocephalus, or extra-axial fluid collection. No focal mass effect or edema. Cisterns are patent. Cerebellar atrophy as well. Vascular: No hyperdense vessel or unexpected calcification. Skull: Normal. Negative for fracture or focal lesion. Sinuses/Orbits: No acute finding.  Other: None. IMPRESSION: Stable atrophy and white matter microvascular ischemic changes. No interval change or acute process by noncontrast CT. Electronically Signed   By: Jerilynn Mages.  Shick M.D.   On: 08/09/2020 15:55   DG Chest Port 1 View  Result Date: 08/09/2020 CLINICAL DATA:  Shortness of breath. EXAM: PORTABLE CHEST 1 VIEW COMPARISON:  07/07/2020 FINDINGS: There is a right chest wall port a catheter with tip in the distal SVC. Stable cardiomediastinal contours. Moderate left pleural effusion and small right pleural effusion are again noted. Mild interstitial edema noted. No airspace consolidation. IMPRESSION: 1. Persistent bilateral pleural effusions, left greater right. 2. Mild interstitial edema. Electronically Signed   By: Kerby Moors M.D.   On: 08/09/2020 15:07      Assessment/Plan Principal Problem:   Hypokalemia Active Problems:   Alcohol abuse   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 ([facility])   Malignant neoplasm of right lung ([facility])   Acute on chronic anemia   Acute encephalopathy   Pancytopenia ([facility])   Polysubstance abuse ([place])   AKI (acute kidney injury) ([place])     #1 significant hypokalemia: Patient most likely had poor oral intake.  We will admit the patient.  IV potassium repletion.  She is having ongoing vomiting which may have contributed.  Once nausea vomiting is resolved we will convert to oral potassium.  N.p.o. for now  #2 polysubstance abuse including cocaine: Supportive care and counseling.  #3 stage IV lung cancer: Follow-up with oncology outpatient.  #4 history of alcohol abuse: Monitor for withdrawal symptoms.  Counseling provided.  #5 tobacco abuse: Initiate nicotine patch.  #6 AKI: Probably prerenal.  Continue to monitor.  #7 sinus tachycardia: Most likely secondary to dehydration.  Continue to monitor  #8 altered mental status: Most likely metabolic encephalopathy due to multiple medical issues.  Continue to monitor patient.  #9 HIV disease: Not on any treatment at the  moment.  #10 anemia: Hemoglobin 7.7.  No transfusion at this point.   DVT prophylaxis: Lovenox Code Status: Full code Family Communication: No family at bedside Disposition Plan: Home Consults called: None Admission status: Inpatient  Severity of Illness: The appropriate patient status for this patient is INPATIENT. Inpatient status is judged to be reasonable and necessary in order to provide the required intensity of service to ensure the patient's safety. The patient's presenting symptoms, physical exam findings, and initial radiographic and laboratory data in the context of their chronic comorbidities is felt to place them at high risk for further clinical deterioration. Furthermore, it is not anticipated that the patient will be medically stable for discharge from the hospital within 2 midnights of admission. The following factors support the patient  status of inpatient.   " The patient's presenting symptoms include altered mental status. " The worrisome physical exam findings include confusion with vomiting. " The initial radiographic and laboratory data are worrisome because of potassium of 2.9. " The chronic co-morbidities include HIV disease with lung cancer.   * I certify that at the point of admission it is my clinical judgment that the patient will require inpatient hospital care spanning beyond 2 midnights from the point of admission due to high intensity of service, high risk for further deterioration and high frequency of surveillance required.Barbette Merino MD [facility] Pager [phone]  If 7PM-7AM, please contact night-coverage [email or web address] Password [facility]  08/09/2020, 7:08 PM

## 2020-08-09 NOTE — Telephone Encounter (Signed)
Scheduled appt sper 5/18 sch msg. Spoke to pt's niece who said pt is in the hospital and they are not sure when she will be out. She said she would call back to r/s appts once they knew more.

## 2020-08-09 NOTE — ED Notes (Signed)
Provider at the bedside to evaluate. 

## 2020-08-09 NOTE — Progress Notes (Signed)
Patient's heart rate is 126, making her a yellow MEWS, MD Jonelle Sidle was notified, new orders placed and protocol initiated. I attempted to have get up and walk in room but patient is unable to follow commands.

## 2020-08-10 ENCOUNTER — Inpatient Hospital Stay (HOSPITAL_COMMUNITY): Payer: Medicaid Other

## 2020-08-10 ENCOUNTER — Other Ambulatory Visit: Payer: Self-pay

## 2020-08-10 DIAGNOSIS — F1910 Other psychoactive substance abuse, uncomplicated: Secondary | ICD-10-CM

## 2020-08-10 DIAGNOSIS — F1010 Alcohol abuse, uncomplicated: Secondary | ICD-10-CM

## 2020-08-10 LAB — COMPREHENSIVE METABOLIC PANEL
ALT: 10 U/L (ref 0–44)
AST: 16 U/L (ref 15–41)
Albumin: 2 g/dL — ABNORMAL LOW (ref 3.5–5.0)
Alkaline Phosphatase: 120 U/L (ref 38–126)
Anion gap: 15 (ref 5–15)
BUN: 19 mg/dL (ref 6–20)
CO2: 14 mmol/L — ABNORMAL LOW (ref 22–32)
Calcium: 8.3 mg/dL — ABNORMAL LOW (ref 8.9–10.3)
Chloride: 113 mmol/L — ABNORMAL HIGH (ref 98–111)
Creatinine, Ser: 2.48 mg/dL — ABNORMAL HIGH (ref 0.44–1.00)
GFR, Estimated: 22 mL/min — ABNORMAL LOW (ref >60)
Glucose, Bld: 93 mg/dL (ref 70–99)
Potassium: 3.4 mmol/L — ABNORMAL LOW (ref 3.5–5.1)
Sodium: 142 mmol/L (ref 135–145)
Total Bilirubin: 1.2 mg/dL (ref 0.3–1.2)
Total Protein: 6.7 g/dL (ref 6.5–8.1)

## 2020-08-10 LAB — CBC
HCT: 25 % — ABNORMAL LOW (ref 36.0–46.0)
Hemoglobin: 8.3 g/dL — ABNORMAL LOW (ref 12.0–15.0)
MCH: 30.3 pg (ref 26.0–34.0)
MCHC: 33.2 g/dL (ref 30.0–36.0)
MCV: 91.2 fL (ref 80.0–100.0)
Platelets: 267 10*3/uL (ref 150–400)
RBC: 2.74 MIL/uL — ABNORMAL LOW (ref 3.87–5.11)
RDW: 16.2 % — ABNORMAL HIGH (ref 11.5–15.5)
WBC: 8.4 10*3/uL (ref 4.0–10.5)
nRBC: 0 % (ref 0.0–0.2)

## 2020-08-10 IMAGING — MR MR HEAD W/O CM
11 series · 42 of 48 positions shown · non-contrast
Comparison: MRI head [DATE]. CT head [DATE] CT head
[DATE]

CLINICAL DATA: Mental status change.  Mental status change

EXAM:
MRI HEAD WITHOUT CONTRAST
TECHNIQUE: Multiplanar, multiecho pulse sequences of the brain and surrounding
structures were obtained without intravenous contrast.

[Series 5: dwi_tracew · axial · 3.0mm · 1.08mm/px · z∈[-73,+83]mm · 8 of 106 slices shown]
[im 1/106]
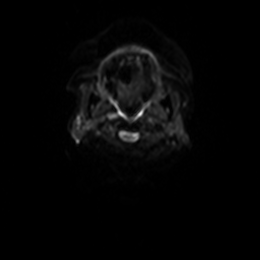
[im 22/106]
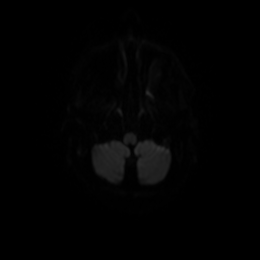
[im 32/106]
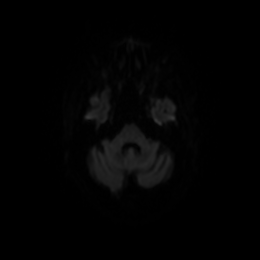
[im 43/106]
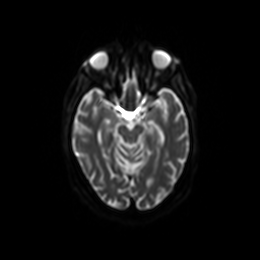
[im 64/106]
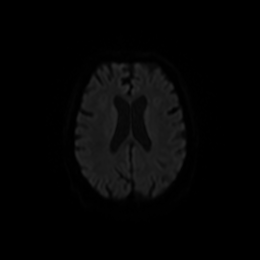
[im 74/106]
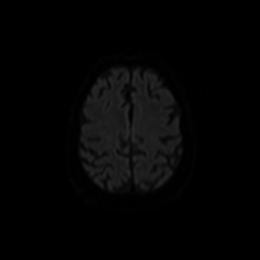
[im 85/106]
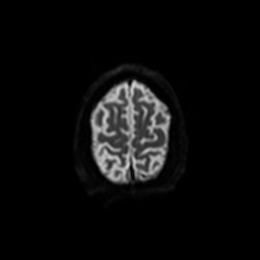
[im 106/106]
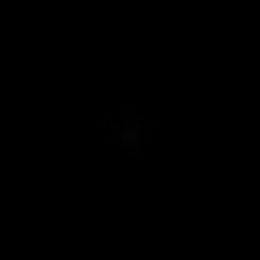

[Series 6: dwi_adc · axial · 3.0mm · 1.08mm/px · z∈[-73,-34]mm · 2 of 53 slices shown]
[im 1/53]
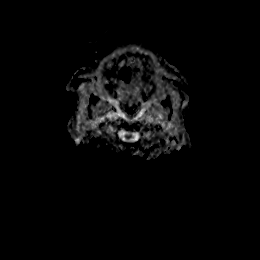
[im 14/53]
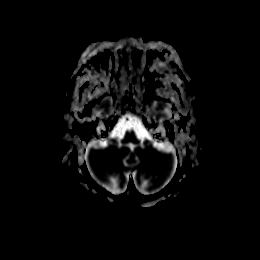

[Series 7: T2 · sagittal · 5.0mm · 0.47mm/px · 2 of 25 slices shown (1 of 4)]
[im 1/25]
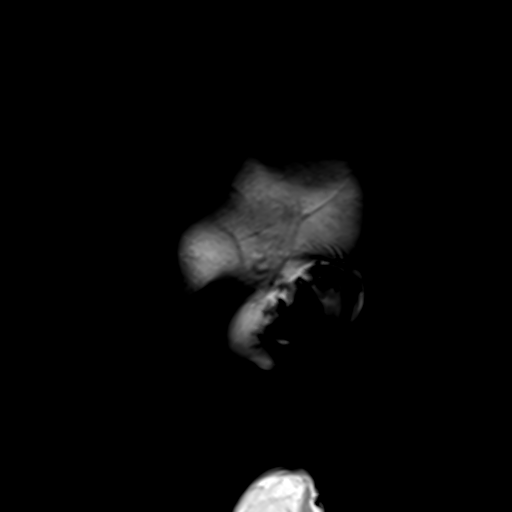
[im 25/25]
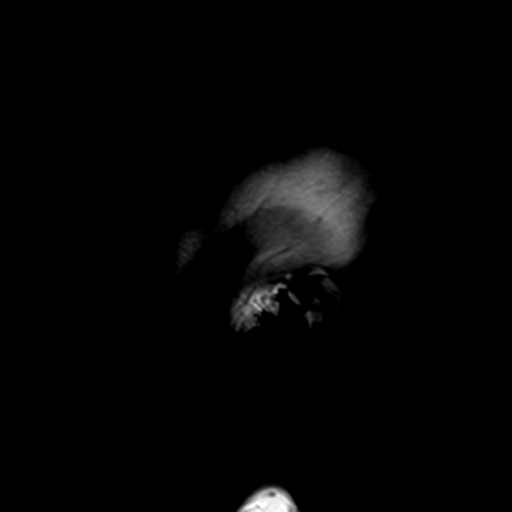

[Series 8: T2 · axial · 5.0mm · 0.45mm/px · 1 of 4 slices shown (2 of 4)]
[im 1/4]
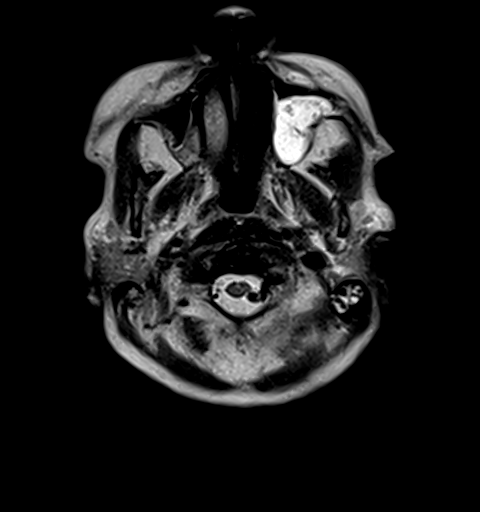

[Series 9: GRE · axial · 3.0mm · 0.45mm/px · z∈[-70,+86]mm · 5 of 53 slices shown]
[im 1/53]
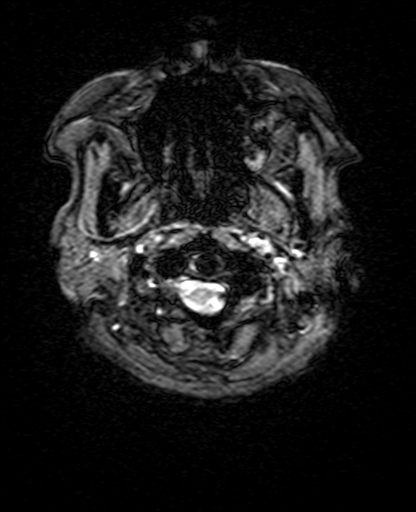
[im 14/53]
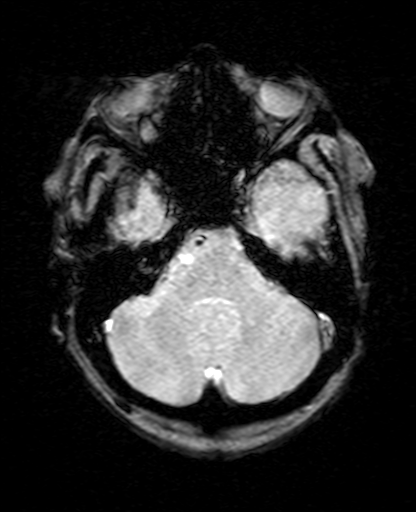
[im 27/53]
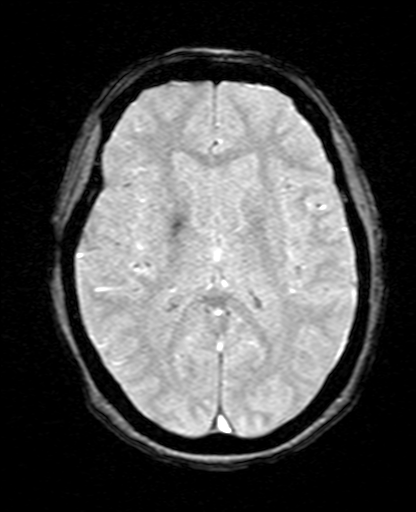
[im 40/53]
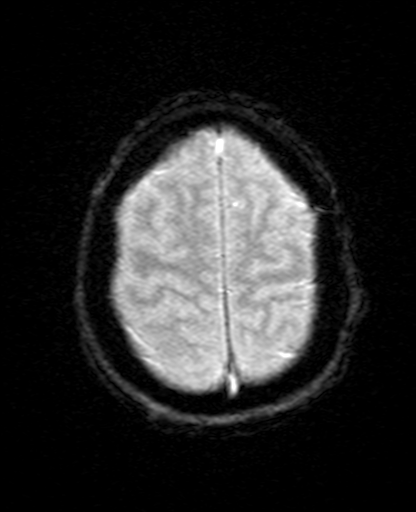
[im 53/53]
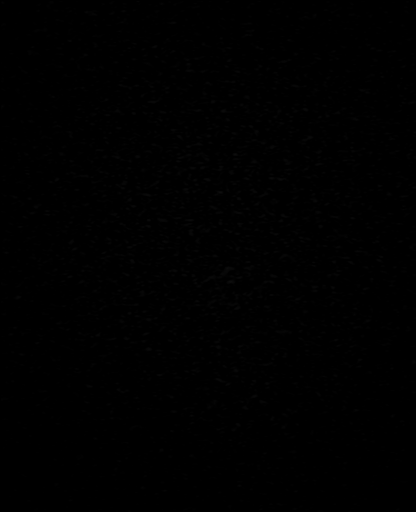

[Series 10: T2 · axial · 5.0mm · 0.45mm/px · z∈[-66,+83]mm · 2 of 24 slices shown (3 of 4)]
[im 1/24]
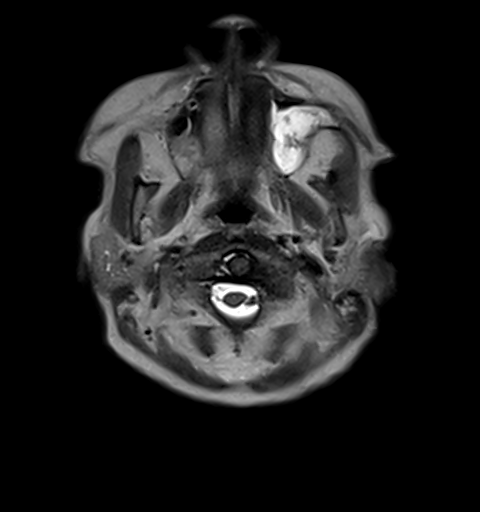
[im 24/24]
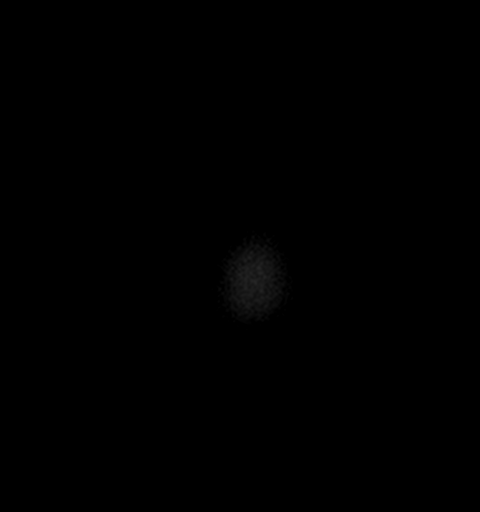

[Series 11: FLAIR · axial · 3.0mm · 0.86mm/px · z∈[-65,+84]mm · 5 of 51 slices shown]
[im 1/51]
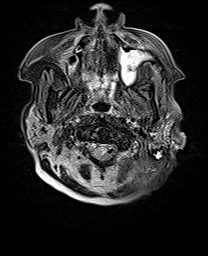
[im 13/51]
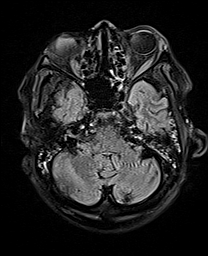
[im 26/51]
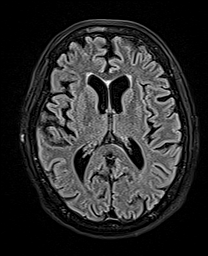
[im 38/51]
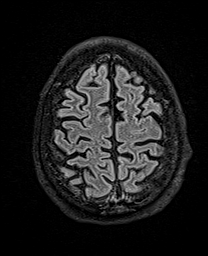
[im 51/51]
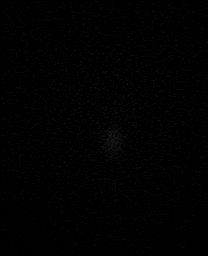

[Series 12: T1 · axial · 3.0mm · 0.45mm/px · z∈[-70,+86]mm · 5 of 53 slices shown]
[im 1/53]
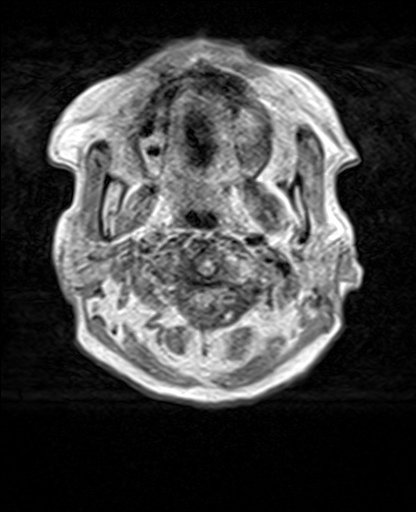
[im 14/53]
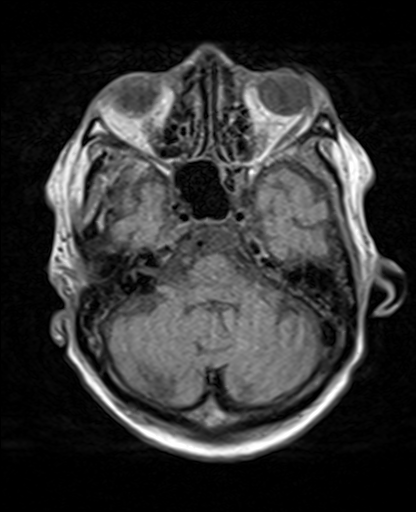
[im 27/53]
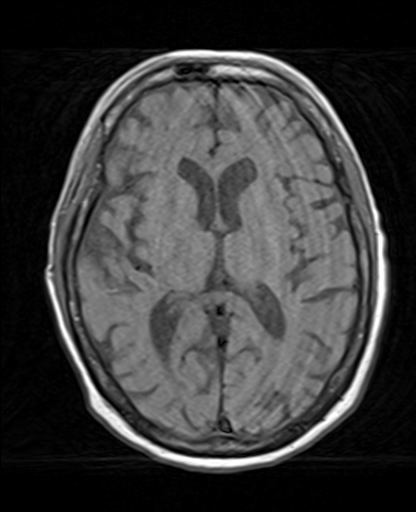
[im 40/53]
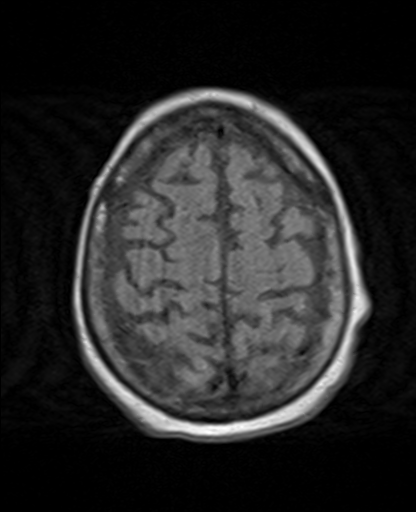
[im 53/53]
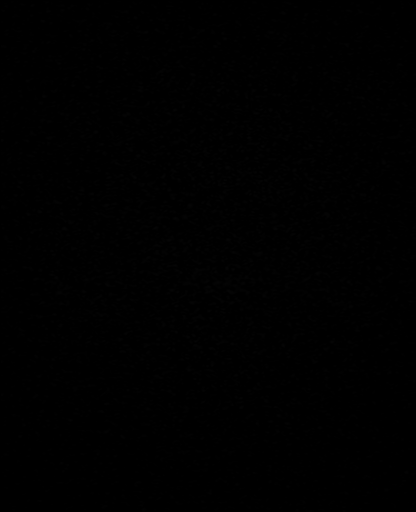

[Series 13: DWI · coronal · 5.0mm · 1.31mm/px · 6 of 60 slices shown (1 of 2)]
[im 1/60]
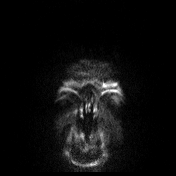
[im 12/60]
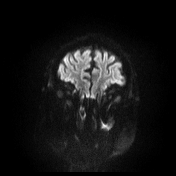
[im 24/60]
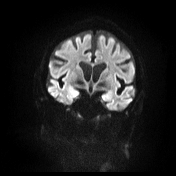
[im 36/60]
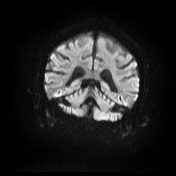
[im 48/60]
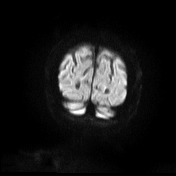
[im 60/60]
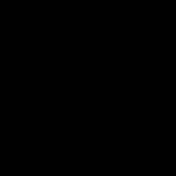

[Series 14: DWI · coronal · 5.0mm · 1.31mm/px · 3 of 29 slices shown (2 of 2)]
[im 1/29]
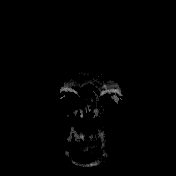
[im 15/29]
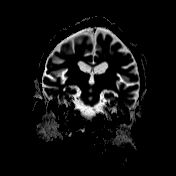
[im 29/29]
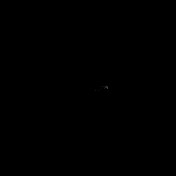

[Series 15: T2 · coronal · 5.0mm · 0.86mm/px · 3 of 29 slices shown (4 of 4)]
[im 1/29]
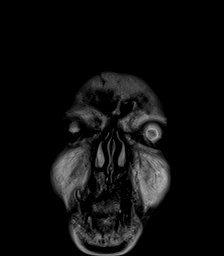
[im 15/29]
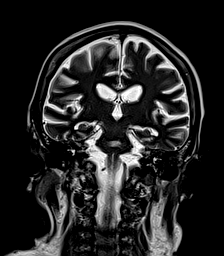
[im 29/29]
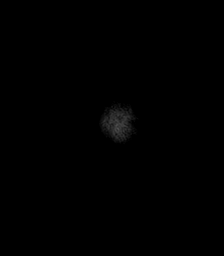

[42 of 48 positions shown; findings below may reference images not displayed]

FINDINGS: Brain: Image quality degraded by motion. Rapid scanning performed
due to motion.

Moderate atrophy. Negative for hydrocephalus. Prominent chronic
infarct in the central pons unchanged. Minimal white matter changes
in the cerebral hemispheres.

Negative for acute infarct, hemorrhage, mass.

Vascular: Normal arterial flow voids at the skull base.

Skull and upper cervical spine: No focal lesion.

Sinuses/Orbits: Mucosal edema paranasal sinuses. Large retention
cyst left maxillary sinus. Negative orbit

Other: None
IMPRESSION: Negative for acute abnormality

Atrophy and chronic ischemic change most notably in the central
pons.

## 2020-08-10 MED ORDER — FERROUS SULFATE 325 (65 FE) MG PO TABS
325.0000 mg | ORAL_TABLET | Freq: Two times a day (BID) | ORAL | Status: DC
  Administered 2020-08-10 – 2020-08-16 (×11): 325 mg via ORAL
  Filled 2020-08-10 (×12): qty 1

## 2020-08-10 MED ORDER — ADULT MULTIVITAMIN W/MINERALS CH
1.0000 | ORAL_TABLET | Freq: Every day | ORAL | Status: DC
  Administered 2020-08-10 – 2020-08-16 (×6): 1 via ORAL
  Filled 2020-08-10 (×6): qty 1

## 2020-08-10 MED ORDER — SODIUM CHLORIDE 0.9% FLUSH
10.0000 mL | INTRAVENOUS | Status: DC | PRN
  Administered 2020-08-14: 10 mL

## 2020-08-10 MED ORDER — THIAMINE HCL 100 MG/ML IJ SOLN
500.0000 mg | Freq: Every day | INTRAVENOUS | Status: AC
  Administered 2020-08-10 – 2020-08-12 (×3): 500 mg via INTRAVENOUS
  Filled 2020-08-10 (×3): qty 5

## 2020-08-10 MED ORDER — LORAZEPAM 2 MG/ML IJ SOLN
0.5000 mg | Freq: Once | INTRAMUSCULAR | Status: DC
  Filled 2020-08-10: qty 1

## 2020-08-10 MED ORDER — SODIUM BICARBONATE 650 MG PO TABS
650.0000 mg | ORAL_TABLET | Freq: Three times a day (TID) | ORAL | Status: DC
  Administered 2020-08-10 (×2): 650 mg via ORAL
  Filled 2020-08-10 (×2): qty 1

## 2020-08-10 MED ORDER — LABETALOL HCL 5 MG/ML IV SOLN
5.0000 mg | INTRAVENOUS | Status: DC | PRN
  Administered 2020-08-10 – 2020-08-11 (×4): 5 mg via INTRAVENOUS
  Filled 2020-08-10 (×4): qty 4

## 2020-08-10 MED ORDER — FOLIC ACID 1 MG PO TABS
1.0000 mg | ORAL_TABLET | Freq: Every day | ORAL | Status: DC
  Administered 2020-08-10 – 2020-08-16 (×6): 1 mg via ORAL
  Filled 2020-08-10 (×6): qty 1

## 2020-08-10 MED ORDER — CHLORHEXIDINE GLUCONATE CLOTH 2 % EX PADS
6.0000 | MEDICATED_PAD | Freq: Every day | CUTANEOUS | Status: DC
  Administered 2020-08-10 – 2020-08-16 (×7): 6 via TOPICAL

## 2020-08-10 MED ORDER — POTASSIUM CHLORIDE 20 MEQ PO PACK
40.0000 meq | PACK | Freq: Once | ORAL | Status: AC
  Administered 2020-08-10: 40 meq via ORAL
  Filled 2020-08-10: qty 2

## 2020-08-10 NOTE — Progress Notes (Signed)
Pt has remained in Yellow MEWS all day due to HR and R. Pt lying in bed without s/s of distress. Confusion persists. Will continue to monitor. Pt remains on Telemetry.

## 2020-08-10 NOTE — Progress Notes (Signed)
Patient's son was updated.

## 2020-08-10 NOTE — Progress Notes (Signed)
Patient ID: Brenda [name], female   DOB: 1960-08-08, 60 y.o.   MRN: [id number]  PROGRESS NOTE    Brenda [name]  JQB:[id number] DOB: [birth year] DOA: 08/09/2020 PCP: Pcp, No   Brief Narrative:  60 year old female with history of stage IV lung cancer status post 12 cycles of pemetrexed and pembrolizumab, depression, breast cancer, alcohol abuse, polysubstance abuse, tobacco abuse recent admission and discharge home (refused SNF placement) from 07/01/2020-08/02/2020 for AKI on CKD stage IV/seizures/pancytopenia presented on 08/09/2020 with altered mental status and confusion along with poor oral intake and nausea with vomiting.  On presentation, potassium was 2.9; creatinine of 3.51.  COVID-19 testing was negative.  Urine drug screen was positive for cocaine.  CT of the head without contrast was negative for acute intracranial abnormality.  Assessment & Plan:   Acute metabolic encephalopathy -Questionable cause.  Presented with confusion and CT of the head was negative for acute intracranial abnormality.  Urine drug screen was positive for cocaine.  Patient is still confused although awake and does not provide any appropriate history.  Will get MRI of the brain without contrast -Monitor mental status.  Fall precautions -PT eval  Hypokalemia -Possibly from poor oral intake.  Improving.  Continue replacement.  Stage IV lung cancer -Follow-up with oncology/Dr. Julien Nordmann as an outpatient -Palliative care consultation for goals of care discussion  Polysubstance abuse History of alcohol abuse Tobacco abuse -Unclear when her last intake of cocaine was.  Urine drug screen was positive for cocaine.  Monitor mental status.  He was multivitamin, thiamine and folic acid.  CKD stage IV -Creatinine at baseline.  Patient does not have acute kidney injury  Persistent sinus tachycardia -Questionable cause.  Will use labetalol if remains persistently tachycardic  Anemia of chronic disease  -Probably from  lung cancer.  Hemoglobin stable.  Monitor  Metabolic acidosis -Questionable cause.  Will start oral sodium bicarbonate.  Generalized deconditioning -PT eval.  Reconsult palliative care for goals of care discussion  DVT prophylaxis: Start Lovenox Code Status: Full Family Communication: None at bedside Disposition Plan: Status is: Inpatient  Remains inpatient appropriate because:Inpatient level of care appropriate due to severity of illness   Dispo: The patient is from: Home              Anticipated d/c is to: Home.  Might need nursing home placement              Patient currently is not medically stable to d/c.   Difficult to place patient No  Consultants: None  Procedures: None  Antimicrobials: None  Subjective: Patient seen and examined at bedside.  She is awake but very confused and keeps repeating "I believe I can fly".  As per nursing staff, she ate her breakfast by herself.  No overnight fever or vomiting reported  Objective: Vitals:   08/10/20 0043 08/10/20 0441 08/10/20 0823 08/10/20 1253  BP: (!) 144/87 130/82 (!) 148/94 (!) 139/98  Pulse: (!) 124 (!) 111 (!) 118 (!) 118  Resp: 20 20 15  (!) 22  Temp: 98.1 F (36.7 C) (!) 97.5 F (36.4 C) 98.1 F (36.7 C) 98.5 F (36.9 C)  TempSrc: Oral Oral    SpO2: 98% 100% 94% 93%    Intake/Output Summary (Last 24 hours) at 08/10/2020 1324 Last data filed at 08/10/2020 0900 Gross per 24 hour  Intake 1441.52 ml  Output 150 ml  Net 1291.52 ml   There were no vitals filed for this visit.  Examination:  General exam: Appears calm and comfortable.  Looks chronically ill and deconditioned.  Currently on room air Respiratory system: Bilateral decreased breath sounds at bases with intermittent tachypnea Cardiovascular system: S1 & S2 heard, tachycardic Gastrointestinal system: Abdomen is nondistended, soft and nontender. Normal bowel sounds heard. Extremities: No cyanosis, clubbing, edema  Central nervous system: Awake,  confused.  No focal neurological deficits. Moving extremities Skin: No rashes, lesions or ulcers Psychiatry: Could not be assessed because of mental status    Data Reviewed: I have personally reviewed following labs and imaging studies  CBC: Recent Labs  Lab 08/09/20 1450 08/10/20 0827  WBC 6.3 8.4  NEUTROABS 4.2  --   HGB 7.7* 8.3*  HCT 23.9* 25.0*  MCV 92.3 91.2  PLT 204 914   Basic Metabolic Panel: Recent Labs  Lab 08/09/20 1450 08/10/20 0827  NA 136 142  K 2.9* 3.4*  CL 107 113*  CO2 19* 14*  GLUCOSE 95 93  BUN 17 19  CREATININE 2.51* 2.48*  CALCIUM 8.0* 8.3*   GFR: Estimated Creatinine Clearance: 21.3 mL/min (A) (by C-G formula based on SCr of 2.48 mg/dL (H)). Liver Function Tests: Recent Labs  Lab 08/09/20 1450 08/10/20 0827  AST 15 16  ALT 12 10  ALKPHOS 121 120  BILITOT 0.9 1.2  PROT 6.8 6.7  ALBUMIN 2.3* 2.0*   No results for input(s): LIPASE, AMYLASE in the last 168 hours. No results for input(s): AMMONIA in the last 168 hours. Coagulation Profile: No results for input(s): INR, PROTIME in the last 168 hours. Cardiac Enzymes: Recent Labs  Lab 08/09/20 1450  CKTOTAL 19*   BNP (last 3 results) No results for input(s): PROBNP in the last 8760 hours. HbA1C: No results for input(s): HGBA1C in the last 72 hours. CBG: No results for input(s): GLUCAP in the last 168 hours. Lipid Profile: No results for input(s): CHOL, HDL, LDLCALC, TRIG, CHOLHDL, LDLDIRECT in the last 72 hours. Thyroid Function Tests: No results for input(s): TSH, T4TOTAL, FREET4, T3FREE, THYROIDAB in the last 72 hours. Anemia Panel: No results for input(s): VITAMINB12, FOLATE, FERRITIN, TIBC, IRON, RETICCTPCT in the last 72 hours. Sepsis Labs: No results for input(s): PROCALCITON, LATICACIDVEN in the last 168 hours.  Recent Results (from the past 240 hour(s))  Resp Panel by RT-PCR (Flu A&B, Covid) Nasopharyngeal Swab     Status: None   Collection Time: 08/09/20  3:08 PM    Specimen: Nasopharyngeal Swab; Nasopharyngeal(NP) swabs in vial transport medium  Result Value Ref Range Status   SARS Coronavirus 2 by RT PCR NEGATIVE NEGATIVE Final    Comment: (NOTE) SARS-CoV-2 target nucleic acids are NOT DETECTED.  The SARS-CoV-2 RNA is generally detectable in upper respiratory specimens during the acute phase of infection. The lowest concentration of SARS-CoV-2 viral copies this assay can detect is 138 copies/mL. A negative result does not preclude SARS-Cov-2 infection and should not be used as the sole basis for treatment or other patient management decisions. A negative result may occur with  improper specimen collection/handling, submission of specimen other than nasopharyngeal swab, presence of viral mutation(s) within the areas targeted by this assay, and inadequate number of viral copies(<138 copies/mL). A negative result must be combined with clinical observations, patient history, and epidemiological information. The expected result is Negative.  Fact Sheet for Patients:  EntrepreneurPulse.com.au  Fact Sheet for Healthcare Providers:  IncredibleEmployment.be  This test is no t yet approved or cleared by the [place] FDA and  has been authorized for detection and/or diagnosis of SARS-CoV-2 by FDA under an Emergency  Use Authorization (EUA). This EUA will remain  in effect (meaning this test can be used) for the duration of the COVID-19 declaration under Section 564(b)(1) of the Act, 21 U.S.C.section 360bbb-3(b)(1), unless the authorization is terminated  or revoked sooner.       Influenza A by PCR NEGATIVE NEGATIVE Final   Influenza B by PCR NEGATIVE NEGATIVE Final    Comment: (NOTE) The Xpert Xpress SARS-CoV-2/FLU/RSV plus assay is intended as an aid in the diagnosis of influenza from Nasopharyngeal swab specimens and should not be used as a sole basis for treatment. Nasal washings and aspirates are  unacceptable for Xpert Xpress SARS-CoV-2/FLU/RSV testing.  Fact Sheet for Patients: EntrepreneurPulse.com.au  Fact Sheet for Healthcare Providers: IncredibleEmployment.be  This test is not yet approved or cleared by the [place] FDA and has been authorized for detection and/or diagnosis of SARS-CoV-2 by FDA under an Emergency Use Authorization (EUA). This EUA will remain in effect (meaning this test can be used) for the duration of the COVID-19 declaration under Section 564(b)(1) of the Act, 21 U.S.C. section 360bbb-3(b)(1), unless the authorization is terminated or revoked.  Performed at [facility], [place] [address] [place], [place] [address]          Radiology Studies: CT Head Wo Contrast  Result Date: 08/09/2020 CLINICAL DATA:  Mental status change EXAM: CT HEAD WITHOUT CONTRAST TECHNIQUE: Contiguous axial images were obtained from the base of the skull through the vertex without intravenous contrast. COMPARISON:  07/01/2020 FINDINGS: Brain: Stable atrophy and white matter microvascular changes throughout both cerebral hemispheres. No acute intracranial hemorrhage, new mass lesion, infarction, midline shift, herniation, hydrocephalus, or extra-axial fluid collection. No focal mass effect or edema. Cisterns are patent. Cerebellar atrophy as well. Vascular: No hyperdense vessel or unexpected calcification. Skull: Normal. Negative for fracture or focal lesion. Sinuses/Orbits: No acute finding. Other: None. IMPRESSION: Stable atrophy and white matter microvascular ischemic changes. No interval change or acute process by noncontrast CT. Electronically Signed   By: Jerilynn Mages.  Shick M.D.   On: 08/09/2020 15:55   MR BRAIN WO CONTRAST  Result Date: 08/10/2020 CLINICAL DATA:  Mental status change.  Mental status change EXAM: MRI HEAD WITHOUT CONTRAST TECHNIQUE: Multiplanar, multiecho pulse sequences of the brain and surrounding structures  were obtained without intravenous contrast. COMPARISON:  MRI head 04/08/2020. CT head 08/09/2020 CT head 08/09/2020 FINDINGS: Brain: Image quality degraded by motion. Rapid scanning performed due to motion. Moderate atrophy. Negative for hydrocephalus. 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 Minimal white matter changes in the cerebral hemispheres. Negative for acute infarct, hemorrhage, mass. Vascular: Normal arterial flow voids at the skull base. Skull and upper cervical spine: No focal lesion. Sinuses/Orbits: Mucosal edema paranasal sinuses. Large retention cyst left maxillary sinus. Negative orbit Other: None IMPRESSION: Negative for acute abnormality Atrophy and chronic ischemic change most notably in the central pons. Electronically Signed   By: Franchot Gallo M.D.   On: 08/10/2020 13:13   DG Chest Port 1 View  Result Date: 08/09/2020 CLINICAL DATA:  Shortness of breath. EXAM: PORTABLE CHEST 1 VIEW COMPARISON:  07/07/2020 FINDINGS: There is a right chest wall port a catheter with tip in the distal SVC. Stable cardiomediastinal contours. Moderate left pleural effusion and small right pleural effusion are again noted. Mild interstitial edema noted. No airspace consolidation. IMPRESSION: 1. Persistent bilateral pleural effusions, left greater right. 2. Mild interstitial edema. Electronically Signed   By: Kerby Moors M.D.   On: 08/09/2020 15:07  Scheduled Meds: . LORazepam  0.5 mg Intravenous Once  . nicotine  21 mg Transdermal Daily  . potassium chloride  40 mEq Oral Once   Continuous Infusions:        Aline August, MD [facility] 08/10/2020, 1:24 PM

## 2020-08-10 NOTE — Progress Notes (Signed)
Upon going into pt's room, noted pt to be acting like she might vomit with dry heaves. Zofran IV given as ordered. HR in the 130's, sustaining around 133. Labetalol IV given as ordered. At this time, HR 108, R 33. Respirations continue to be high in the 30's. When asked, pt states she feels OK.  Monitoring continues.  Pt lying in bed with eyes open, TV on.

## 2020-08-11 ENCOUNTER — Inpatient Hospital Stay (HOSPITAL_COMMUNITY): Payer: Medicaid Other

## 2020-08-11 DIAGNOSIS — C799 Secondary malignant neoplasm of unspecified site: Secondary | ICD-10-CM

## 2020-08-11 LAB — MAGNESIUM: Magnesium: 1.5 mg/dL — ABNORMAL LOW (ref 1.7–2.4)

## 2020-08-11 LAB — CBC WITH DIFFERENTIAL/PLATELET
Abs Immature Granulocytes: 0.08 10*3/uL — ABNORMAL HIGH (ref 0.00–0.07)
Basophils Absolute: 0 10*3/uL (ref 0.0–0.1)
Basophils Relative: 0 %
Eosinophils Absolute: 0 10*3/uL (ref 0.0–0.5)
Eosinophils Relative: 0 %
HCT: 25.6 % — ABNORMAL LOW (ref 36.0–46.0)
Hemoglobin: 8.2 g/dL — ABNORMAL LOW (ref 12.0–15.0)
Immature Granulocytes: 1 %
Lymphocytes Relative: 27 %
Lymphs Abs: 1.9 10*3/uL (ref 0.7–4.0)
MCH: 29.1 pg (ref 26.0–34.0)
MCHC: 32 g/dL (ref 30.0–36.0)
MCV: 90.8 fL (ref 80.0–100.0)
Monocytes Absolute: 1.1 10*3/uL — ABNORMAL HIGH (ref 0.1–1.0)
Monocytes Relative: 15 %
Neutro Abs: 4 10*3/uL (ref 1.7–7.7)
Neutrophils Relative %: 57 %
Platelets: 305 10*3/uL (ref 150–400)
RBC: 2.82 MIL/uL — ABNORMAL LOW (ref 3.87–5.11)
RDW: 16.5 % — ABNORMAL HIGH (ref 11.5–15.5)
WBC: 7 10*3/uL (ref 4.0–10.5)
nRBC: 0 % (ref 0.0–0.2)

## 2020-08-11 LAB — URINE CULTURE: Culture: NO GROWTH

## 2020-08-11 LAB — COMPREHENSIVE METABOLIC PANEL
ALT: 10 U/L (ref 0–44)
AST: 16 U/L (ref 15–41)
Albumin: 2 g/dL — ABNORMAL LOW (ref 3.5–5.0)
Alkaline Phosphatase: 121 U/L (ref 38–126)
Anion gap: 11 (ref 5–15)
BUN: 22 mg/dL — ABNORMAL HIGH (ref 6–20)
CO2: 17 mmol/L — ABNORMAL LOW (ref 22–32)
Calcium: 8.4 mg/dL — ABNORMAL LOW (ref 8.9–10.3)
Chloride: 114 mmol/L — ABNORMAL HIGH (ref 98–111)
Creatinine, Ser: 2.52 mg/dL — ABNORMAL HIGH (ref 0.44–1.00)
GFR, Estimated: 22 mL/min — ABNORMAL LOW (ref >60)
Glucose, Bld: 114 mg/dL — ABNORMAL HIGH (ref 70–99)
Potassium: 3.8 mmol/L (ref 3.5–5.1)
Sodium: 142 mmol/L (ref 135–145)
Total Bilirubin: 0.3 mg/dL (ref 0.3–1.2)
Total Protein: 6.7 g/dL (ref 6.5–8.1)

## 2020-08-11 IMAGING — DX DG CHEST 2V
2 series · 2 of 2 positions shown · non-contrast
Comparison: [DATE]

CLINICAL DATA: Shortness of breath

EXAM:
CHEST - 2 VIEW

[chest lat]
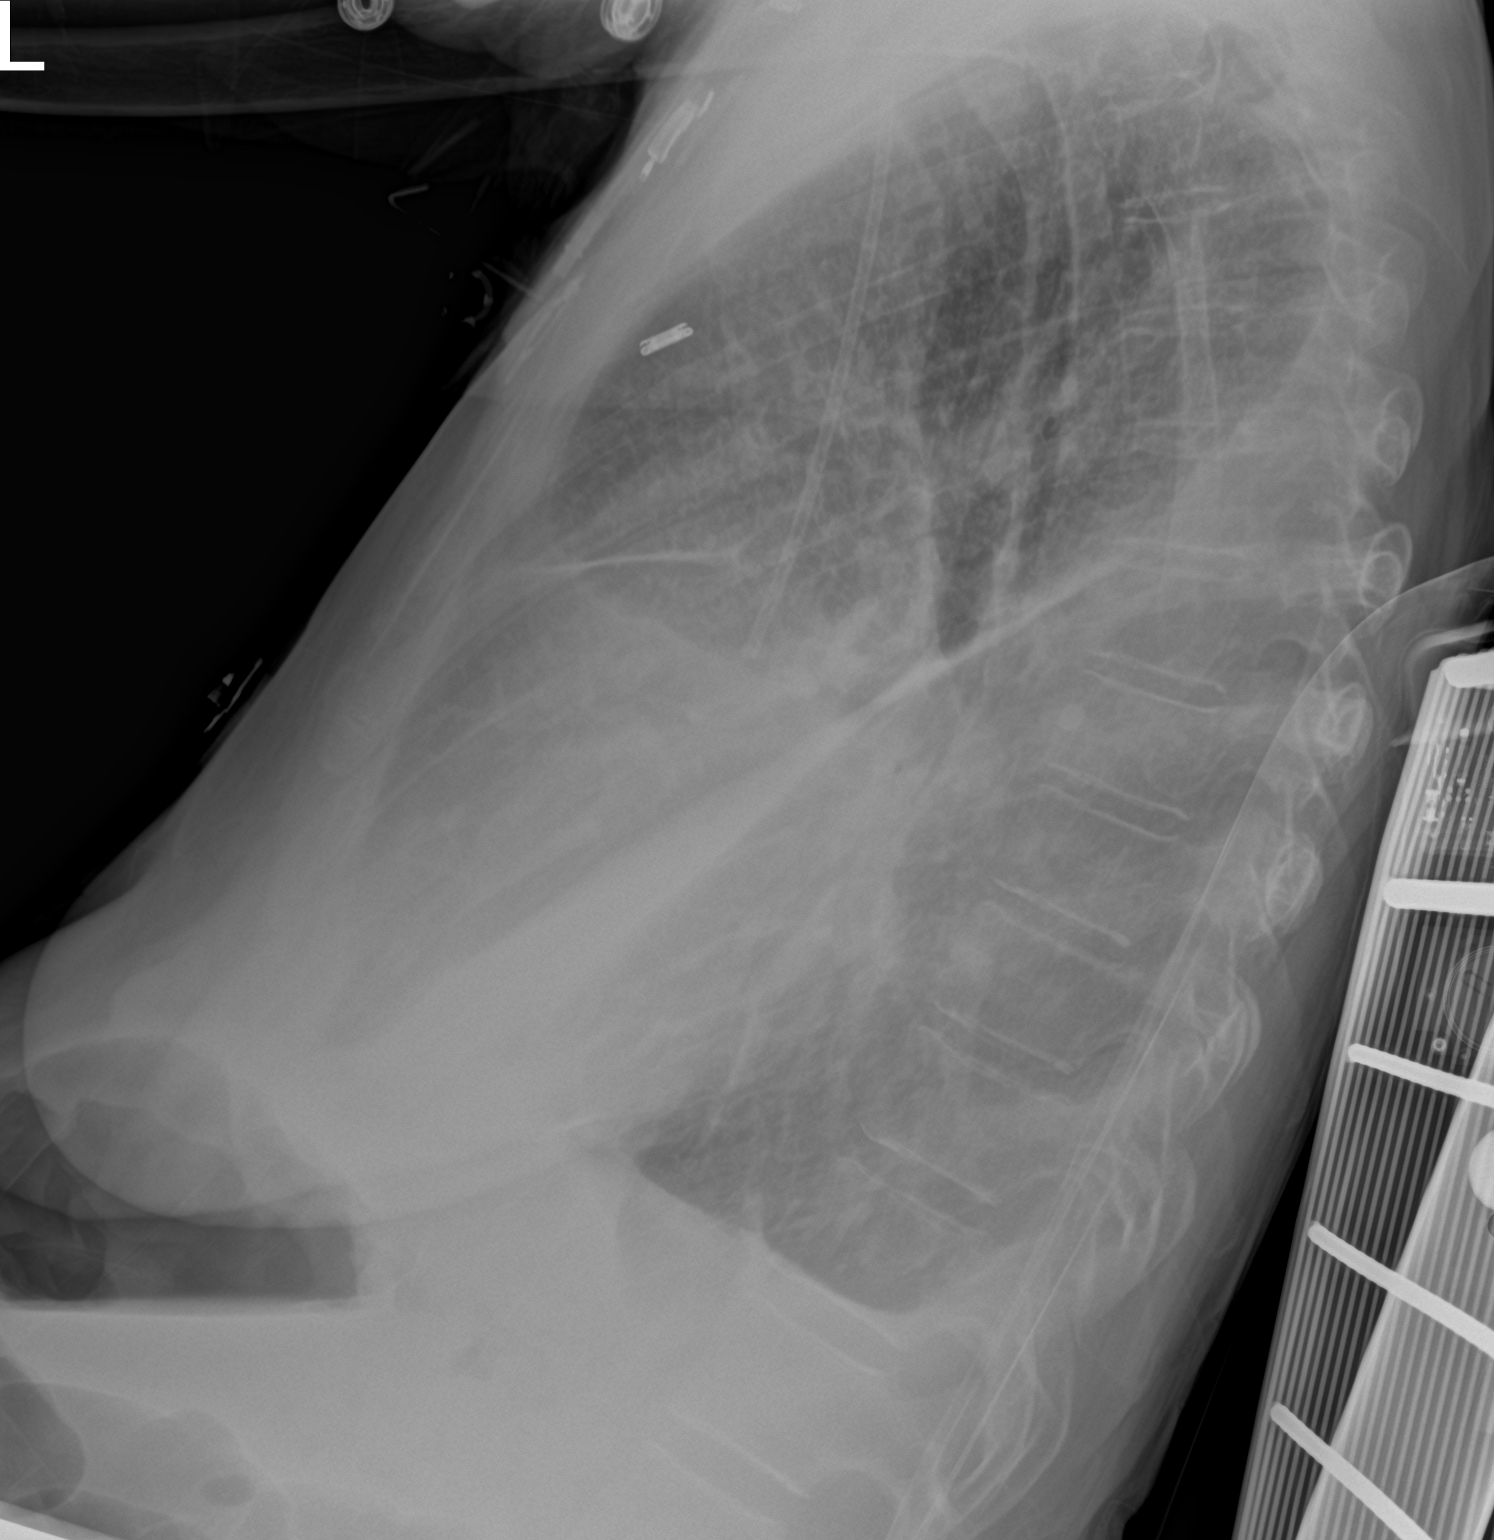

[chest ap]
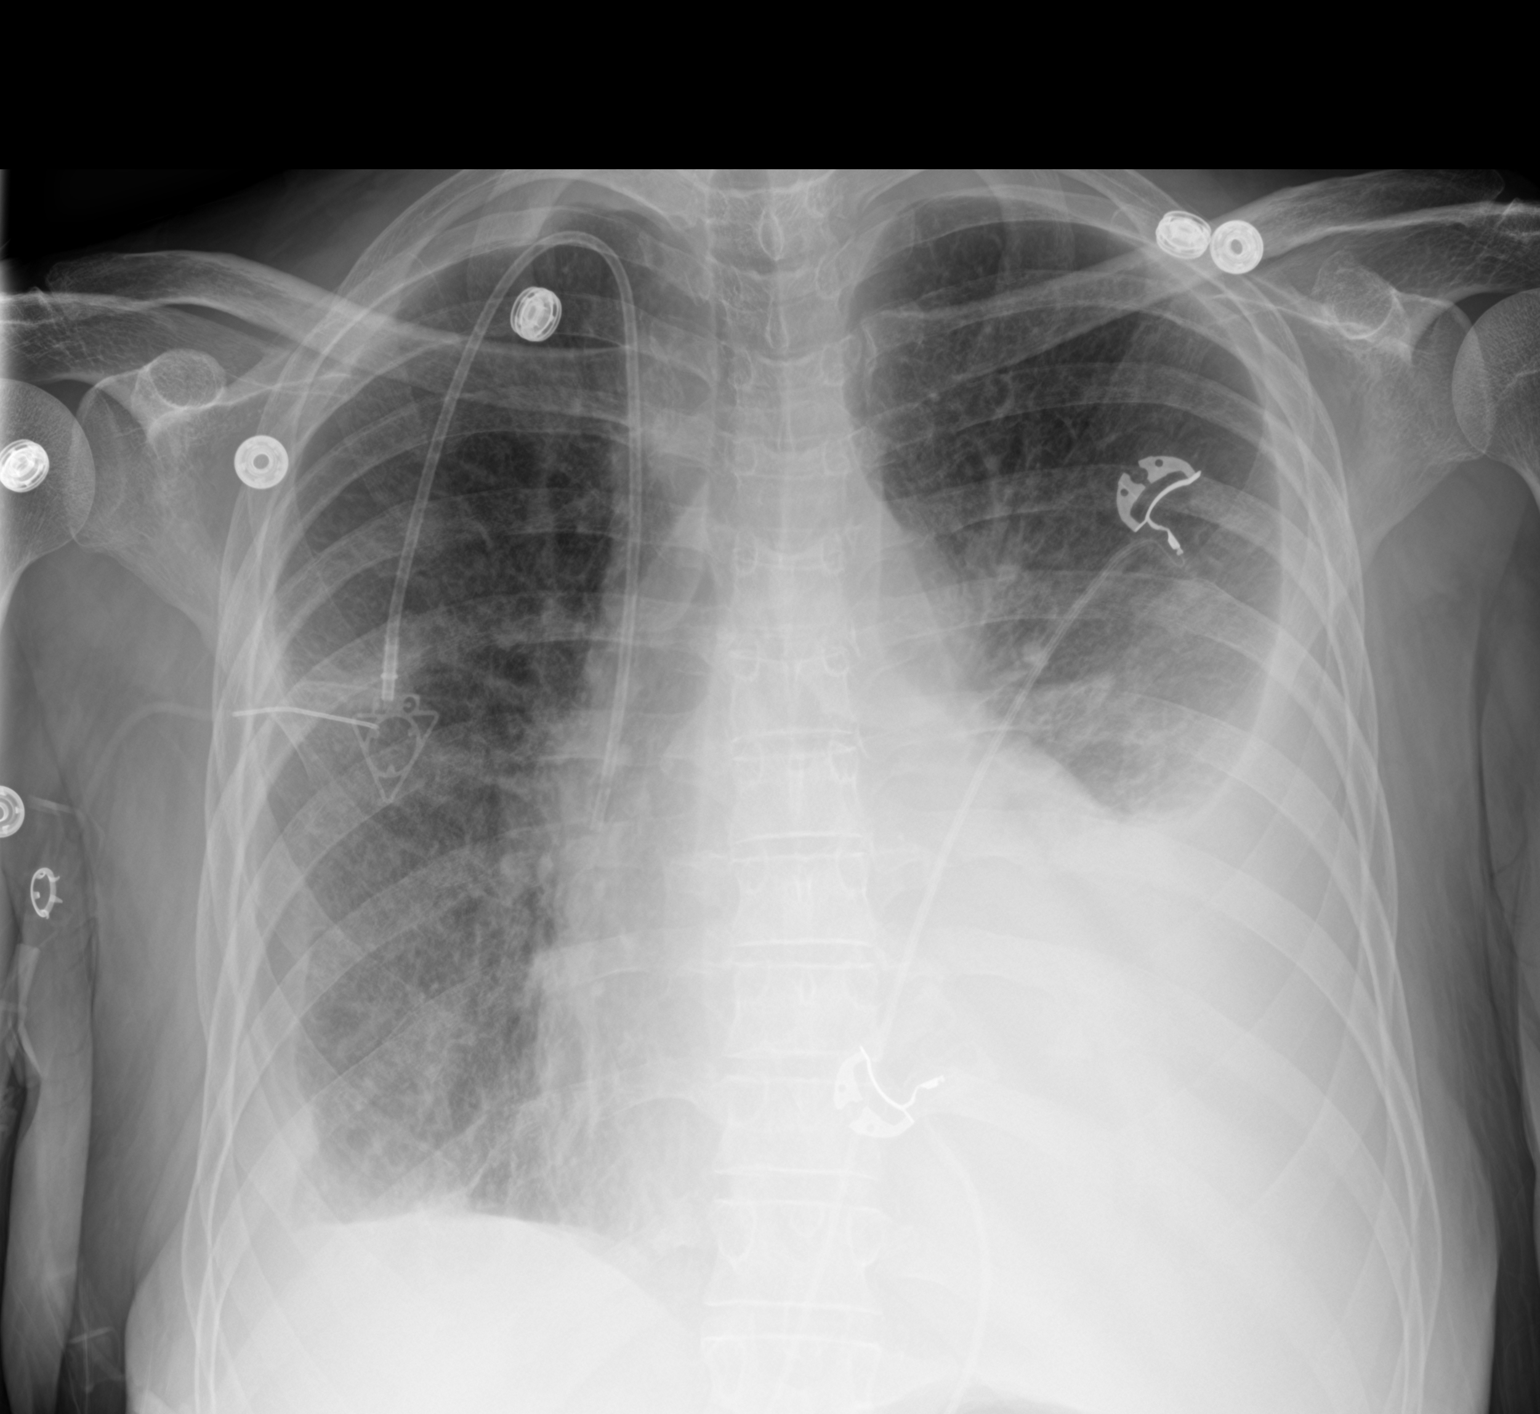

[2 of 2 positions shown; findings below may reference images not displayed]

FINDINGS: Cardiomegaly. Large left pleural effusion. Small right pleural
effusion. Bibasilar atelectasis. Right Port-A-Cath remains in place,
unchanged.
IMPRESSION: Bilateral effusions, left greater than right with bibasilar
atelectasis.

Cardiomegaly.

## 2020-08-11 MED ORDER — LORAZEPAM 2 MG/ML IJ SOLN
0.5000 mg | INTRAMUSCULAR | Status: DC | PRN
  Administered 2020-08-11 – 2020-08-12 (×3): 1 mg via INTRAVENOUS
  Filled 2020-08-11 (×4): qty 1

## 2020-08-11 MED ORDER — LORAZEPAM 2 MG/ML IJ SOLN: 0.5000 mg | INTRAMUSCULAR | Status: DC | PRN

## 2020-08-11 MED ORDER — MAGNESIUM SULFATE 2 GM/50ML IV SOLN
2.0000 g | Freq: Once | INTRAVENOUS | Status: AC
  Administered 2020-08-11: 2 g via INTRAVENOUS
  Filled 2020-08-11: qty 50

## 2020-08-11 MED ORDER — HALOPERIDOL LACTATE 5 MG/ML IJ SOLN
INTRAMUSCULAR | Status: AC
  Filled 2020-08-11: qty 1

## 2020-08-11 MED ORDER — FUROSEMIDE 10 MG/ML IJ SOLN
60.0000 mg | Freq: Once | INTRAMUSCULAR | Status: AC
  Administered 2020-08-11: 60 mg via INTRAVENOUS
  Filled 2020-08-11: qty 6

## 2020-08-11 MED ORDER — HALOPERIDOL LACTATE 5 MG/ML IJ SOLN
2.0000 mg | Freq: Four times a day (QID) | INTRAMUSCULAR | Status: DC | PRN
  Administered 2020-08-11: 2 mg via INTRAVENOUS

## 2020-08-11 NOTE — Progress Notes (Signed)
Agitation medication given as ordered and pt now back in bed and is asleep. Will continue to monitor.

## 2020-08-11 NOTE — Progress Notes (Signed)
Pt now trying to get OOB, pulling at lines/tubes, and trying to leave. Writer paged MD and new orders received.

## 2020-08-11 NOTE — Progress Notes (Signed)
Patient ID: Brenda [name], female   DOB: 07/19/1960, 60 y.o.   MRN: [id number]  PROGRESS NOTE    Brenda [name]  LTJ:[id number] DOB: 20-Dec-1960 DOA: 08/09/2020 PCP: Pcp, No   Brief Narrative:  60 year old female with history of stage IV lung cancer status post 12 cycles of pemetrexed and pembrolizumab, depression, breast cancer, alcohol abuse, polysubstance abuse, tobacco abuse recent admission and discharge home (refused SNF placement) from 07/01/2020-08/02/2020 for AKI on CKD stage IV/seizures/pancytopenia presented on 08/09/2020 with altered mental status and confusion along with poor oral intake and nausea with vomiting.  On presentation, potassium was 2.9; creatinine of 3.51.  COVID-19 testing was negative.  Urine drug screen was positive for cocaine.  CT of the head without contrast was negative for acute intracranial abnormality.  Assessment & Plan:   Acute metabolic encephalopathy -Questionable cause.  Presented with confusion and CT of the head was negative for acute intracranial abnormality.  Urine drug screen was positive for cocaine.  Patient is still confused although awake and does not provide any appropriate history.   -MRI of brain was negative for any intracranial acute abnormality. -EEG.  If EEG is negative and patient is still confused, will request neurology evaluation -Monitor mental status.  Fall precautions -PT eval -SLP evaluation.  Hypokalemia -Possibly from poor oral intake.  Labs pending for today.  Stage IV lung cancer -Follow-up with oncology/Dr. Julien Nordmann as an outpatient -Palliative care consultation for goals of care discussion  Polysubstance abuse History of alcohol abuse Tobacco abuse -Unclear when her last intake of cocaine was.  Urine drug screen was positive for cocaine.  Monitor mental status.  He was multivitamin, thiamine and folic acid.  CKD stage IV -Creatinine at baseline.  Patient does not have acute kidney injury.  Labs pending for  today.  Persistent sinus tachycardia -Questionable cause.  Will use labetalol if remains persistently tachycardic  Anemia of chronic disease  -Probably from lung cancer.  Hemoglobin stable.  Monitor  Metabolic acidosis -Questionable cause.  Continue serum bicarbonate  Generalized deconditioning -PT eval.  Reconsult palliative care for goals of care discussion  DVT prophylaxis: Start Lovenox Code Status: Full Family Communication: None at bedside Disposition Plan: Status is: Inpatient  Remains inpatient appropriate because:Inpatient level of care appropriate due to severity of illness   Dispo: The patient is from: Home              Anticipated d/c is to: Home.  Might need nursing home placement              Patient currently is not medically stable to d/c.   Difficult to place patient No  Consultants: None  Procedures: None  Antimicrobials: None  Subjective: Patient seen and examined at bedside.  Awake but still confused.  Hardly participates in any conversation today.  No overnight fever or vomiting reported.  Nursing staff reports that she had some choking with pills. Objective: Vitals:   08/10/20 2047 08/10/20 2357 08/11/20 0500 08/11/20 0548  BP: (!) 133/98 124/87 116/74 113/77  Pulse: (!) 132 (!) 107 (!) 121 (!) 105  Resp: 16  20   Temp: 98.6 F (37 C)  97.6 F (36.4 C)   TempSrc: Oral  Oral   SpO2: 99% 100% 100% 100%    Intake/Output Summary (Last 24 hours) at 08/11/2020 0801 Last data filed at 08/11/2020 0500 Gross per 24 hour  Intake 600 ml  Output 500 ml  Net 100 ml   There were no vitals filed for this visit.  Examination:  General exam: Appears calm and comfortable.  Looks chronically ill and deconditioned.  On room air currently. Respiratory system: Decreased breath sounds at bases bilaterally with intermittent tachypnea and some scattered crackles  cardiovascular system: Tachycardic, S1-S2 heard Gastrointestinal system: Abdomen is slightly  distended, soft and nontender.  Bowel sounds are heard  extremities: Trace lower extremity edema; no clubbing Central nervous system: Awake, still confused, hardly participates in any conversation..  No focal neurological deficits.  Moves extremities  skin: No obvious ecchymosis/lesions Psychiatry: Cannot be assessed because mental status   Data Reviewed: I have personally reviewed following labs and imaging studies  CBC: Recent Labs  Lab 08/09/20 1450 08/10/20 0827  WBC 6.3 8.4  NEUTROABS 4.2  --   HGB 7.7* 8.3*  HCT 23.9* 25.0*  MCV 92.3 91.2  PLT 204 809   Basic Metabolic Panel: Recent Labs  Lab 08/09/20 1450 08/10/20 0827  NA 136 142  K 2.9* 3.4*  CL 107 113*  CO2 19* 14*  GLUCOSE 95 93  BUN 17 19  CREATININE 2.51* 2.48*  CALCIUM 8.0* 8.3*   GFR: Estimated Creatinine Clearance: 21.3 mL/min (A) (by C-G formula based on SCr of 2.48 mg/dL (H)). Liver Function Tests: Recent Labs  Lab 08/09/20 1450 08/10/20 0827  AST 15 16  ALT 12 10  ALKPHOS 121 120  BILITOT 0.9 1.2  PROT 6.8 6.7  ALBUMIN 2.3* 2.0*   No results for input(s): LIPASE, AMYLASE in the last 168 hours. No results for input(s): AMMONIA in the last 168 hours. Coagulation Profile: No results for input(s): INR, PROTIME in the last 168 hours. Cardiac Enzymes: Recent Labs  Lab 08/09/20 1450  CKTOTAL 19*   BNP (last 3 results) No results for input(s): PROBNP in the last 8760 hours. HbA1C: No results for input(s): HGBA1C in the last 72 hours. CBG: No results for input(s): GLUCAP in the last 168 hours. Lipid Profile: No results for input(s): CHOL, HDL, LDLCALC, TRIG, CHOLHDL, LDLDIRECT in the last 72 hours. Thyroid Function Tests: No results for input(s): TSH, T4TOTAL, FREET4, T3FREE, THYROIDAB in the last 72 hours. Anemia Panel: No results for input(s): VITAMINB12, FOLATE, FERRITIN, TIBC, IRON, RETICCTPCT in the last 72 hours. Sepsis Labs: No results for input(s): PROCALCITON, LATICACIDVEN in  the last 168 hours.  Recent Results (from the past 240 hour(s))  Resp Panel by RT-PCR (Flu A&B, Covid) Nasopharyngeal Swab     Status: None   Collection Time: 08/09/20  3:08 PM   Specimen: Nasopharyngeal Swab; Nasopharyngeal(NP) swabs in vial transport medium  Result Value Ref Range Status   SARS Coronavirus 2 by RT PCR NEGATIVE NEGATIVE Final    Comment: (NOTE) SARS-CoV-2 target nucleic acids are NOT DETECTED.  The SARS-CoV-2 RNA is generally detectable in upper respiratory specimens during the acute phase of infection. The lowest concentration of SARS-CoV-2 viral copies this assay can detect is 138 copies/mL. A negative result does not preclude SARS-Cov-2 infection and should not be used as the sole basis for treatment or other patient management decisions. A negative result may occur with  improper specimen collection/handling, submission of specimen other than nasopharyngeal swab, presence of viral mutation(s) within the areas targeted by this assay, and inadequate number of viral copies(<138 copies/mL). A negative result must be combined with clinical observations, patient history, and epidemiological information. The expected result is Negative.  Fact Sheet for Patients:  EntrepreneurPulse.com.au  Fact Sheet for Healthcare Providers:  IncredibleEmployment.be  This test is no t yet approved or cleared by the  [place] Architectural technologist and  has been authorized for detection and/or diagnosis of SARS-CoV-2 by FDA under an Print production planner (EUA). This EUA will remain  in effect (meaning this test can be used) for the duration of the COVID-19 declaration under Section 564(b)(1) of the Act, 21 U.S.C.section 360bbb-3(b)(1), unless the authorization is terminated  or revoked sooner.       Influenza A by PCR NEGATIVE NEGATIVE Final   Influenza B by PCR NEGATIVE NEGATIVE Final    Comment: (NOTE) The Xpert Xpress SARS-CoV-2/FLU/RSV plus assay  is intended as an aid in the diagnosis of influenza from Nasopharyngeal swab specimens and should not be used as a sole basis for treatment. Nasal washings and aspirates are unacceptable for Xpert Xpress SARS-CoV-2/FLU/RSV testing.  Fact Sheet for Patients: EntrepreneurPulse.com.au  Fact Sheet for Healthcare Providers: IncredibleEmployment.be  This test is not yet approved or cleared by the [place] FDA and has been authorized for detection and/or diagnosis of SARS-CoV-2 by FDA under an Emergency Use Authorization (EUA). This EUA will remain in effect (meaning this test can be used) for the duration of the COVID-19 declaration under Section 564(b)(1) of the Act, 21 U.S.C. section 360bbb-3(b)(1), unless the authorization is terminated or revoked.  Performed at [facility], [place] [address] [place],  [address]          Radiology Studies: CT Head Wo Contrast  Result Date: 08/09/2020 CLINICAL DATA:  Mental status change EXAM: CT HEAD WITHOUT CONTRAST TECHNIQUE: Contiguous axial images were obtained from the base of the skull through the vertex without intravenous contrast. COMPARISON:  07/01/2020 FINDINGS: Brain: Stable atrophy and white matter microvascular changes throughout both cerebral hemispheres. No acute intracranial hemorrhage, new mass lesion, infarction, midline shift, herniation, hydrocephalus, or extra-axial fluid collection. No focal mass effect or edema. Cisterns are patent. Cerebellar atrophy as well. Vascular: No hyperdense vessel or unexpected calcification. Skull: Normal. Negative for fracture or focal lesion. Sinuses/Orbits: No acute finding. Other: None. IMPRESSION: Stable atrophy and white matter microvascular ischemic changes. No interval change or acute process by noncontrast CT. Electronically Signed   By: Jerilynn Mages.  Shick M.D.   On: 08/09/2020 15:55   MR BRAIN WO CONTRAST  Result Date:  08/10/2020 CLINICAL DATA:  Mental status change.  Mental status change EXAM: MRI HEAD WITHOUT CONTRAST TECHNIQUE: Multiplanar, multiecho pulse sequences of the brain and surrounding structures were obtained without intravenous contrast. COMPARISON:  MRI head 04/08/2020. CT head 08/09/2020 CT head 08/09/2020 FINDINGS: Brain: Image quality degraded by motion. Rapid scanning performed due to motion. Moderate atrophy. Negative for hydrocephalus. 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 Minimal white matter changes in the cerebral hemispheres. Negative for acute infarct, hemorrhage, mass. Vascular: Normal arterial flow voids at the skull base. Skull and upper cervical spine: No focal lesion. Sinuses/Orbits: Mucosal edema paranasal sinuses. Large retention cyst left maxillary sinus. Negative orbit Other: None IMPRESSION: Negative for acute abnormality Atrophy and chronic ischemic change most notably in the central pons. Electronically Signed   By: Franchot Gallo M.D.   On: 08/10/2020 13:13   DG Chest Port 1 View  Result Date: 08/09/2020 CLINICAL DATA:  Shortness of breath. EXAM: PORTABLE CHEST 1 VIEW COMPARISON:  07/07/2020 FINDINGS: There is a right chest wall port a catheter with tip in the distal SVC. Stable cardiomediastinal contours. Moderate left pleural effusion and small right pleural effusion are again noted. Mild interstitial edema noted. No airspace consolidation. IMPRESSION: 1. Persistent bilateral pleural effusions, left greater right. 2. Mild  interstitial edema. Electronically Signed   By: Kerby Moors M.D.   On: 08/09/2020 15:07        Scheduled Meds: . Chlorhexidine Gluconate Cloth  6 each Topical Daily  . ferrous sulfate  325 mg Oral BID WC  . folic acid  1 mg Oral Daily  . LORazepam  0.5 mg Intravenous Once  . multivitamin with minerals  1 tablet Oral Daily  . nicotine  21 mg Transdermal Daily  . sodium bicarbonate  650 mg Oral TID   Continuous Infusions: . thiamine  injection 500 mg (08/10/20 1520)          Aline August, MD [facility] 08/11/2020, 8:01 AM

## 2020-08-11 NOTE — Consult Note (Signed)
Consultation Note Date: 08/11/2020   Patient Name: Brenda [name]  DOB: [birth year]  MRN: [id number]  Age / Sex: 60 y.o., female  PCP: Pcp, No Referring Physician: Aline August, MD  Reason for Consultation: Establishing goals of care  HPI/Patient Profile: 60 y.o. female  with past medical history of stage IV NSCLC s/p 12 cycles chemotherapy, HIV, depression, history of breast cancer, alcohol abuse, tobacco abuse, polysubstance abuse (cocaine), recent concern for seizures admitted on 08/09/2020 from home with altered mental status. Found to have hypokalemia and with poor intake and now pocketing food. Toxicology +cocaine. MRI brain negative for acute changes but shows atrophy and chronic ischemia. Previously seen by inpatient palliative care 07/18/20 with request to continue full code and full scope of treatment with goal of improved functional status.   Clinical Assessment and Goals of Care: I met today at Brenda [name]'s bedside. No visitors present. I have reviewed previous palliative consultation in which Brenda [name] requests full scope care and full code. Brenda [name] is alert and when I greet her and ask how she is doing she says "fine" but then I can get no further verbal response or even head shake for yes/no questions. She does follow simple commands. She is in no distress. She makes eye contact and tracks. She does seem impulsive. She pulls at telemetry at times. During my visit she would occasionally sit straight up in bed and whip her head around towards window - I worry if she may be having some visual hallucinations.   Yuval is unable to participate in goals of care conversation currently. Noted that her son would be her surrogate decision maker. I will follow up with Alajah and see if she can speak for herself tomorrow and if not I will reach out to son to discuss goals of care further.   Primary Decision Maker NEXT OF  KIN son    SUMMARY OF RECOMMENDATIONS   - Allow more time for Brenda [name] to have improved mentation to speak for herself as she has been able to do so in the past.   Code Status/Advance Care Planning:  Full code   Symptom Management:   Per attending. Could her mentation changes be part of withdrawal from alcohol or cocaine??  Palliative Prophylaxis:   Aspiration and Delirium Protocol  Prognosis:   Overall prognosis poor with advanced cancer.   Discharge Planning: To Be Determined      Primary Diagnoses: Present on Admission: . Hypokalemia . Acute encephalopathy . Acute on chronic anemia . Alcohol abuse . 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 ([place]) . Malignant neoplasm of right lung ([place]) . Pancytopenia ([place]) . Polysubstance abuse ([place]) . AKI (acute kidney injury) ([place])   I have reviewed the medical record, interviewed the patient and family, and examined the patient. The following aspects are pertinent.  Past Medical History:  Diagnosis Date  . Breast cancer ([place])   . Depression   . Mental disorder    Social History   Socioeconomic History  . Marital status: Legally Separated    Spouse name: Not  on file  . Number of children: Not on file  . Years of education: Not on file  . Highest education level: Not on file  Occupational History  . Not on file  Tobacco Use  . Smoking status: Former Smoker    Packs/day: 0.50    Years: 30.00    Pack years: 15.00    Types: Cigarettes    Quit date: 05/27/2019    Years since quitting: 1.2  . Smokeless tobacco: Never Used  Vaping Use  . Vaping Use: Never used  Substance and Sexual Activity  . Alcohol use: Yes    Alcohol/week: 84.0 standard drinks    Types: 84 Cans of beer per week  . Drug use: Yes    Types: Cocaine  . Sexual activity: Not on file  Other Topics Concern  . Not on file  Social History Narrative   ** Merged History Encounter **       Social Determinants of Health   Financial Resource Strain: Not on file  Food  Insecurity: Not on file  Transportation Needs: Not on file  Physical Activity: Not on file  Stress: Not on file  Social Connections: Not on file   History reviewed. No pertinent family history. Scheduled Meds: . Chlorhexidine Gluconate Cloth  6 each Topical Daily  . ferrous sulfate  325 mg Oral BID WC  . folic acid  1 mg Oral Daily  . LORazepam  0.5 mg Intravenous Once  . multivitamin with minerals  1 tablet Oral Daily  . nicotine  21 mg Transdermal Daily  . sodium bicarbonate  650 mg Oral TID   Continuous Infusions: . thiamine injection 500 mg (08/11/20 1159)   PRN Meds:.labetalol, ondansetron **OR** ondansetron (ZOFRAN) IV, sodium chloride flush Allergies  Allergen Reactions  . Aspirin Adult Low [Aspirin] Other (See Comments)    Stomach upset   Review of Systems  Unable to perform ROS: Other  Constitutional:       Patient confused and mostly nonverbal    Physical Exam Vitals and nursing note reviewed.  Constitutional:      Appearance: She is ill-appearing.     Comments: Thin, frail   Cardiovascular:     Rate and Rhythm: Tachycardia present.  Pulmonary:     Effort: Accessory muscle usage present. No tachypnea or respiratory distress.  Neurological:     Mental Status: She is alert. She is confused.     Comments: When asked how she was doing she answered "fine" but then would not verbalize anything else or answer any other questions. She does follow some simple commands. She often would be distracted and impulsively sit up in bed and turn towards window - I worry if she is having visual hallucinations. She does appear very impulsive and pulling at telemetry wires often as well.      Vital Signs: BP 113/77   Pulse (!) 105   Temp 97.6 F (36.4 C) (Oral)   Resp 20   LMP 01/19/2011   SpO2 100%  Pain Scale: 0-10   Pain Score: 0-No pain   SpO2: SpO2: 100 % O2 Device:SpO2: 100 % O2 Flow Rate: .   IO: Intake/output summary:   Intake/Output Summary (Last 24  hours) at 08/11/2020 1319 Last data filed at 08/11/2020 0827 Gross per 24 hour  Intake 720 ml  Output 500 ml  Net 220 ml    LBM: Last BM Date:  (unknown to patient) Baseline Weight:   Most recent weight:  Palliative Assessment/Data:     Time In: 1320 Time Out: 1350 Time Total: 30 min Greater than 50%  of this time was spent counseling and coordinating care related to the above assessment and plan.  Signed by: Vinie Sill, NP Palliative Medicine Team Pager # [phone] (M-F 8a-5p) Team Phone # [phone] (Nights/Weekends)

## 2020-08-11 NOTE — Plan of Care (Signed)

## 2020-08-11 NOTE — Evaluation (Signed)
Physical Therapy Evaluation Patient Details Name: Brenda [name] MRN: [id number] DOB: 1960-12-02 Today's Date: 08/11/2020   History of Present Illness  60 years old female adm with AMS, hypokalemia and acute on chronic anemia.  Pt also with recent adm with Bacteremia associated with intravascular line, ARF, acute GI bleed, pancytopenia.   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  Pt refused recommended SNF level rehab following last hospital admit.  Clinical Impression  Pt admitted as above and presenting with functional mobility limitations 2* generalized weakness, ambulatory balance deficits, limited endurance, and poor safety awareness.  Pt would benefit from follow up rehab at SNF level to maximize IND and safety prior to return home with limited assist.    Follow Up Recommendations SNF    Equipment Recommendations   (Pt unable to report home equipment,  RW and wc requested by PT on recent hospital admit)    Recommendations for Other Services       Precautions / Restrictions Precautions Precautions: Fall Restrictions Weight Bearing Restrictions: No      Mobility  Bed Mobility Overal bed mobility: Needs Assistance Bed Mobility: Supine to Sit     Supine to sit: Supervision;Min assist     General bed mobility comments: Increased time with use of bedrail    Transfers Overall transfer level: Needs assistance Equipment used: Rolling walker (2 wheeled) Transfers: Sit to/from [facility] Sit to Stand: Mod assist Stand pivot transfers: Mod assist       General transfer comment: cues for LE management and use of UEs to self assist.  Significant assist to bring wt up and fwd and to balance in standing  Ambulation/Gait Ambulation/Gait assistance: Min assist Gait Distance (Feet): 68 Feet Assistive device: Rolling walker (2 wheeled) Gait Pattern/deviations:  Step-through pattern;Decreased stride length;Shuffle;Trunk flexed     General Gait Details: Cues for posture, position from RW and safety awareness  Stairs            Wheelchair Mobility    Modified Rankin (Stroke Patients Only)       Balance Overall balance assessment: Needs assistance Sitting-balance support: No upper extremity supported;Feet supported Sitting balance-Leahy Scale: Fair     Standing balance support: Bilateral upper extremity supported Standing balance-Leahy Scale: Poor Standing balance comment: reliant on UE support                             Pertinent Vitals/Pain Pain Assessment: No/denies pain Pain Intervention(s): Limited activity within patient's tolerance;Monitored during session    [place] expects to be discharged to:: Private residence Living Arrangements: Spouse/significant other Available Help at Discharge: Friend(s) Type of Home: Apartment Home Access: Stairs to enter Entrance Stairs-Rails: Right Entrance Stairs-Number of Steps: 14-16 Home Layout: One level Home Equipment: None Additional Comments: Taken from previous admit - pt unable to provide information    Prior Function Level of Independence: Needs assistance         Comments: On recent visit, pt reported her brothers assisted her up stairs.  Pt unable to provide information     Hand Dominance        Extremity/Trunk Assessment   Upper Extremity Assessment Upper Extremity Assessment: Generalized weakness    Lower Extremity Assessment Lower Extremity Assessment: Generalized weakness       Communication   Communication: Other (comment) (Pt follows multi-modal cues for most tasks but min verbalization besides  repeating name "Grissel")  Cognition Arousal/Alertness: Awake/alert Behavior During Therapy: Flat affect;Impulsive Overall Cognitive Status: No family/caregiver present to determine baseline cognitive functioning Area of  Impairment: Safety/judgement;Awareness                   Current Attention Level: Selective     Safety/Judgement: Decreased awareness of deficits Awareness: Emergent Problem Solving: Requires verbal cues;Requires tactile cues;Decreased initiation General Comments: Pt alert and oriented to self      General Comments      Exercises     Assessment/Plan    PT Assessment Patient needs continued PT services  PT Problem List Decreased strength;Decreased range of motion;Decreased activity tolerance;Decreased mobility;Decreased knowledge of use of DME;Decreased balance;Pain;Decreased cognition       PT Treatment Interventions DME instruction;Therapeutic activities;Gait training;Functional mobility training;Therapeutic exercise;Patient/family education;Balance training;Stair training    PT Goals (Current goals can be found in the Care Plan section)  Acute Rehab PT Goals Patient Stated Goal: None states PT Goal Formulation: Patient unable to participate in goal setting Time For Goal Achievement: 08/25/20 Potential to Achieve Goals: Fair    Frequency Min 2X/week   Barriers to discharge        Co-evaluation               AM-PAC PT "6 Clicks" Mobility  Outcome Measure Help needed turning from your back to your side while in a flat bed without using bedrails?: A Little Help needed moving from lying on your back to sitting on the side of a flat bed without using bedrails?: A Little Help needed moving to and from a bed to a chair (including a wheelchair)?: A Lot Help needed standing up from a chair using your arms (e.g., wheelchair or bedside chair)?: A Lot Help needed to walk in hospital room?: A Lot Help needed climbing 3-5 steps with a railing? : Total 6 Click Score: 13    End of Session Equipment Utilized During Treatment: Gait belt Activity Tolerance: Patient tolerated treatment well Patient left: in chair;with call bell/phone within reach;with chair alarm  set Nurse Communication: Mobility status PT Visit Diagnosis: Muscle weakness (generalized) (M62.81);Difficulty in walking, not elsewhere classified (R26.2);Unsteadiness on feet (R26.81);History of falling (Z91.81)    Time: 2229-7989 PT Time Calculation (min) (ACUTE ONLY): 18 min   Charges:   PT Evaluation $PT Eval Low Complexity: 1 Low          [place] [facility] Pager [phone] Office [phone]   Chalsey Leeth 08/11/2020, 2:22 PM

## 2020-08-11 NOTE — Progress Notes (Signed)
Pt rested about 5 minutes after IV agitation med. Laid in bed and attempted to fold wash cloths for a bit. Now jumping back out of bed and pulling tubes. Will take for walk with assistance and FWW. Attempt to calm patient and put her back to bed.

## 2020-08-11 NOTE — Progress Notes (Signed)
Writer arrived to find pt with HR 122 R 28. Pt has gotten to chair with PT and walked with FWW in hallway. Remains confused. Staff unable to understand rambling from pt. MD aware of increased HR and R.  New orders received.  Will continue to closely monitor.   Writer is pocketing food in mouth. Sports coach and NT several attempts to remove food from pt's mouth.  Discussed with MD. Awaiting ST evaluation. Will continue to give pt Ensure, as no difficulties noted with patient drinking.

## 2020-08-12 ENCOUNTER — Inpatient Hospital Stay (HOSPITAL_COMMUNITY): Payer: Medicaid Other

## 2020-08-12 ENCOUNTER — Inpatient Hospital Stay (HOSPITAL_COMMUNITY)
Admission: EM | Admit: 2020-08-12 | Discharge: 2020-08-12 | Disposition: A | Discharge status: Home / Self Care | Payer: Medicaid Other | Source: Home / Self Care | Attending: Internal Medicine | Admitting: Internal Medicine

## 2020-08-12 DIAGNOSIS — C3491 Malignant neoplasm of unspecified part of right bronchus or lung: Secondary | ICD-10-CM

## 2020-08-12 DIAGNOSIS — G9340 Encephalopathy, unspecified: Secondary | ICD-10-CM | POA: Diagnosis not present

## 2020-08-12 DIAGNOSIS — Z7189 Other specified counseling: Secondary | ICD-10-CM

## 2020-08-12 DIAGNOSIS — D649 Anemia, unspecified: Secondary | ICD-10-CM

## 2020-08-12 DIAGNOSIS — Z515 Encounter for palliative care: Secondary | ICD-10-CM

## 2020-08-12 LAB — BASIC METABOLIC PANEL
Anion gap: 7 (ref 5–15)
BUN: 27 mg/dL — ABNORMAL HIGH (ref 6–20)
CO2: 25 mmol/L (ref 22–32)
Calcium: 8.6 mg/dL — ABNORMAL LOW (ref 8.9–10.3)
Chloride: 113 mmol/L — ABNORMAL HIGH (ref 98–111)
Creatinine, Ser: 2.59 mg/dL — ABNORMAL HIGH (ref 0.44–1.00)
GFR, Estimated: 21 mL/min — ABNORMAL LOW (ref >60)
Glucose, Bld: 107 mg/dL — ABNORMAL HIGH (ref 70–99)
Potassium: 3.6 mmol/L (ref 3.5–5.1)
Sodium: 145 mmol/L (ref 135–145)

## 2020-08-12 LAB — CBC WITH DIFFERENTIAL/PLATELET
Abs Immature Granulocytes: 0.07 10*3/uL (ref 0.00–0.07)
Basophils Absolute: 0 10*3/uL (ref 0.0–0.1)
Basophils Relative: 0 %
Eosinophils Absolute: 0 10*3/uL (ref 0.0–0.5)
Eosinophils Relative: 0 %
HCT: 22 % — ABNORMAL LOW (ref 36.0–46.0)
Hemoglobin: 7 g/dL — ABNORMAL LOW (ref 12.0–15.0)
Immature Granulocytes: 1 %
Lymphocytes Relative: 28 %
Lymphs Abs: 1.4 10*3/uL (ref 0.7–4.0)
MCH: 29.5 pg (ref 26.0–34.0)
MCHC: 31.8 g/dL (ref 30.0–36.0)
MCV: 92.8 fL (ref 80.0–100.0)
Monocytes Absolute: 0.9 10*3/uL (ref 0.1–1.0)
Monocytes Relative: 19 %
Neutro Abs: 2.6 10*3/uL (ref 1.7–7.7)
Neutrophils Relative %: 52 %
Platelets: 252 10*3/uL (ref 150–400)
RBC: 2.37 MIL/uL — ABNORMAL LOW (ref 3.87–5.11)
RDW: 16.8 % — ABNORMAL HIGH (ref 11.5–15.5)
WBC: 5 10*3/uL (ref 4.0–10.5)
nRBC: 0 % (ref 0.0–0.2)

## 2020-08-12 LAB — PREPARE RBC (CROSSMATCH)

## 2020-08-12 LAB — AMMONIA: Ammonia: 16 umol/L (ref 9–35)

## 2020-08-12 LAB — VITAMIN B12: Vitamin B-12: 967 pg/mL — ABNORMAL HIGH (ref 180–914)

## 2020-08-12 LAB — FOLATE: Folate: 16.3 ng/mL (ref >5.9)

## 2020-08-12 LAB — TSH: TSH: 4.419 u[IU]/mL (ref 0.350–4.500)

## 2020-08-12 LAB — MAGNESIUM: Magnesium: 1.8 mg/dL (ref 1.7–2.4)

## 2020-08-12 MED ORDER — SODIUM CHLORIDE 0.9% IV SOLUTION: Freq: Once | INTRAVENOUS | Status: AC

## 2020-08-12 MED ORDER — FUROSEMIDE 10 MG/ML IJ SOLN
40.0000 mg | Freq: Once | INTRAMUSCULAR | Status: AC
  Administered 2020-08-12: 40 mg via INTRAVENOUS
  Filled 2020-08-12: qty 4

## 2020-08-12 NOTE — NC FL2 (Signed)
[place] LEVEL OF CARE SCREENING TOOL     IDENTIFICATION  Patient Name: Brenda [name] Birthdate: 04/03/1960 Sex: female Admission Date (Current Location): 08/09/2020  [facility] and [place] Number:  Herbalist and Address:  [facility],  [place] [address], [place]      Provider Number: [id number]  Attending Physician Name and Address:  Aline August, MD  Relative Name and Phone Number:  Elige Ko, significant other, [phone]    Current Level of Care: Hospital Recommended Level of Care: [place] Prior Approval Number:    Date Approved/Denied:   PASRR Number: [id number]  Discharge Plan: SNF    Current Diagnoses: Patient Active Problem List   Diagnosis Date Noted  . Bacteremia associated with intravascular line ([place]) 07/03/2020  . Acute blood loss anemia   . Renal failure 07/01/2020  . AKI (acute kidney injury) ([place])   . Metabolic acidosis   . Epistaxis   . Seizure-like activity ([place])   . Swelling of lower extremity 05/29/2020  . Hypoalbuminemia 05/29/2020  . Acute on chronic anemia 04/07/2020  . Gastrointestinal hemorrhage 04/07/2020  . Acute encephalopathy 04/07/2020  . Pancytopenia ([place]) 04/07/2020  . Polysubstance abuse ([place]) 04/07/2020  . Hypokalemia 10/19/2019  . Neutropenia ([place]) 10/05/2019  . Malignant neoplasm of right lung ([place]) 09/14/2019  . Encounter for antineoplastic chemotherapy 09/14/2019  . Encounter for antineoplastic immunotherapy 09/14/2019  . Goals of care, counseling/discussion 09/14/2019  . Tobacco abuse counseling 09/14/2019  . 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 ([place])   . Alcohol abuse   . Acute respiratory failure ([place]) 04/03/2019  . Lung mass   . Multifocal pneumonia   . Major depressive disorder   . Major depressive disorder, recurrent episode with mood-congruent psychotic features ([place]) 05/30/2017  . 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[place])  12/26/2011    Orientation RESPIRATION BLADDER Height & Weight     Self,Situation,Place  Normal External catheter Weight:   Height:     BEHAVIORAL SYMPTOMS/MOOD NEUROLOGICAL BOWEL NUTRITION STATUS   (none)  (none) Continent Diet (see d/c summary)  AMBULATORY STATUS COMMUNICATION OF NEEDS Skin   Extensive Assist Verbally Normal                       Personal Care Assistance Level of Assistance  Bathing,Feeding,Dressing Bathing Assistance: Maximum assistance Feeding assistance: Limited assistance Dressing Assistance: Limited assistance     Functional Limitations Info  Sight,Hearing,Speech Sight Info: Adequate Hearing Info: Adequate Speech Info: Adequate    SPECIAL CARE FACTORS FREQUENCY  PT (By licensed PT),OT (By licensed OT)     PT Frequency: 5X/W OT Frequency: 5X/W            Contractures Contractures Info: Not present    Additional Factors Info  Code Status,Allergies Code Status Info: full Allergies Info: Aspirin           Current Medications (08/12/2020):  This is the current hospital active medication list Current Facility-Administered Medications  Medication Dose Route Frequency Provider Last Rate Last Admin  . Chlorhexidine Gluconate Cloth 2 % PADS 6 each  6 each Topical Daily Aline August, MD   6 each at 08/12/20 1133  . ferrous sulfate tablet 325 mg  325 mg Oral BID WC Aline August, MD   325 mg at 08/12/20 1132  . folic acid (FOLVITE) tablet 1 mg  1 mg Oral Daily Starla Link, Kshitiz, MD   1 mg at 08/12/20 1132  . labetalol (NORMODYNE) injection 5  mg  5 mg Intravenous Q4H PRN Aline August, MD   5 mg at 08/11/20 1336  . LORazepam (ATIVAN) injection 0.5 mg  0.5 mg Intravenous Once Alekh, Kshitiz, MD      . LORazepam (ATIVAN) injection 0.5-1 mg  0.5-1 mg Intravenous Q4H PRN Aline August, MD   1 mg at 08/11/20 2346  . multivitamin with minerals tablet 1 tablet  1 tablet Oral Daily Aline August, MD   1 tablet at 08/12/20 1132  . nicotine (NICODERM  CQ - dosed in mg/24 hours) patch 21 mg  21 mg Transdermal Daily Gala Romney L, MD   21 mg at 08/12/20 1133  . ondansetron (ZOFRAN) tablet 4 mg  4 mg Oral Q6H PRN Elwyn Reach, MD       Or  . ondansetron (ZOFRAN) injection 4 mg  4 mg Intravenous Q6H PRN Elwyn Reach, MD   4 mg at 08/10/20 1542  . sodium chloride flush (NS) 0.9 % injection 10-40 mL  10-40 mL Intracatheter PRN Aline August, MD         Discharge Medications: Please see discharge summary for a list of discharge medications.  Relevant Imaging Results:  Relevant Lab Results:   Additional Information [place], [place]

## 2020-08-12 NOTE — Progress Notes (Signed)
Palliative:  HPI: 60 y.o. female  with past medical history of stage IV NSCLC s/p 12 cycles chemotherapy, HIV, depression, history of breast cancer, alcohol abuse, tobacco abuse, polysubstance abuse (cocaine), recent concern for seizures admitted on 08/09/2020 from home with altered mental status. Found to have hypokalemia and with poor intake and now pocketing food. Toxicology +cocaine. MRI brain negative for acute changes but shows atrophy and chronic ischemia. Previously seen by inpatient palliative care 07/18/20 with request to continue full code and full scope of treatment with goal of improved functional status.   I met again today with Brenda [name]. She is much more alert and interactive. She is able to have conversation and seems aware of her situation although with poor insight and judgement. I explained to her that I saw her yesterday and she was very confused and unable to even have conversation. We discussed her cancer and expectations that this is not curable and may not really be treatable at this stage. We discussed the risks of further treatment with likely little benefit at this stage. Brenda [name] ponders this idea. She is clear in her goals to pursue full treatment to keep her alive as long as possible including full code. She does endorse that she would not want to be kept alive on machines if she is without improvement and would prefer focus on comfort care at that time. She identifies her son, Brenda [name], as her Air traffic controller.   I did speak with [name] briefly about her alcohol and cocaine abuse. I praised her for sustaining from smoking but encouraged her to consider abstaining from alcohol and cocaine. She tells me she will consider this and knows that this is difficult on her body. I question if this is what led to her confusion and decline and if alcohol withdrawal could be part of her altered mental state. She believes she did not drink since 3 days ago (but she has been confused and her  timing is not reliable). I am unsure about the frequency of her alcohol and cocaine abuse. I also encouraged her to truly consider rehab to work to get her muscles stronger so she will be safer once she returns back to her home.   All questions/concerns addressed. Emotional support provided.   Exam: Alert, oriented. Poor insight and judgement. No distress. Thin, frail. Breathing regular, unlabored. Abd flat.   Plan: - Continue full scope, full code.  - Would not want life prolonged with artifical measures without improvement.  - Goals are clear and consistent with previous stated goals. Palliative will follow peripherally.   [place], NP Palliative Medicine Team Pager [phone] (Please see [email or web address] for schedule) Team Phone [phone]    Greater than 50%  of this time was spent counseling and coordinating care related to the above assessment and plan

## 2020-08-12 NOTE — Progress Notes (Signed)
Patient ID: Brenda [name], female   DOB: 1960-11-03, 60 y.o.   MRN: [id number]  PROGRESS NOTE    Brenda [name]  JQB:[id number] DOB: [birth year] DOA: 08/09/2020 PCP: Pcp, No   Brief Narrative:  60 year old female with history of stage IV lung cancer status post 12 cycles of pemetrexed and pembrolizumab, depression, breast cancer, alcohol abuse, polysubstance abuse, tobacco abuse recent admission and discharge home (refused SNF placement) from 07/01/2020-08/02/2020 for AKI on CKD stage IV/seizures/pancytopenia presented on 08/09/2020 with altered mental status and confusion along with poor oral intake and nausea with vomiting.  On presentation, potassium was 2.9; creatinine of 3.51.  COVID-19 testing was negative.  Urine drug screen was positive for cocaine.  CT of the head without contrast was negative for acute intracranial abnormality.  Assessment & Plan:   Acute metabolic encephalopathy -Questionable cause.  Presented with confusion and CT of the head was negative for acute intracranial abnormality.  Urine drug screen was positive for cocaine.  Patient is still confused although awake and does not provide any appropriate history.   -MRI of brain was negative for any intracranial acute abnormality. -EEG pending.  Mental status is improving today. -Monitor mental status.  Fall precautions -PT recommended SNF.  Social worker consult. -SLP evaluation. -Ammonia, TSH, folate and vitamin B12 levels normal  Hypokalemia -Possibly from poor oral intake.  Improved.  Stage IV lung cancer -Follow-up with oncology/Dr. Julien Nordmann as an outpatient -Palliative care consultation for goals of care discussion  Polysubstance abuse History of alcohol abuse Tobacco abuse -Unclear when her last intake of cocaine was.  Urine drug screen was positive for cocaine.  Monitor mental status.  Continue multivitamin, thiamine and folic acid.  CKD stage IV -Creatinine at baseline.  Patient does not have acute kidney  injury.  -Creatinine 2.59 today.  Monitor  Persistent sinus tachycardia -Questionable cause.  Will use labetalol if remains persistently tachycardic  Anemia of chronic disease  -Probably from lung cancer.  Hemoglobin 7 today.  Transfuse 1 unit of packed red cells and will give 40 mg IV Lasix after transfusion.  Monitor  Metabolic acidosis -Questionable cause.  Resolved.  DC sodium bicarbonate.  Generalized deconditioning -Reconsult palliative care for goals of care discussion  DVT prophylaxis: Hold off on Lovenox because of anemia.  Start SCDs. Code Status: Full Family Communication: None at bedside Disposition Plan: Status is: Inpatient  Remains inpatient appropriate because:Inpatient level of care appropriate due to severity of illness   Dispo: The patient is from: Home              Anticipated d/c is to: SNF.                Patient currently is not medically stable to d/c.   Difficult to place patient No  Consultants: None  Procedures: None  Antimicrobials: None  Subjective: Patient seen and examined at bedside.  Patient was more confused and agitated yesterday afternoon as per nursing staff.  No overnight fever or vomiting reported.   Objective: Vitals:   08/12/20 0030 08/12/20 0125 08/12/20 0443 08/12/20 0500  BP: 103/72  (!) 126/99   Pulse: 82  (!) 156   Resp:  (!) 24 16 (!) 25  Temp: 98 F (36.7 C)  (!) 97.3 F (36.3 C)   TempSrc:   Oral   SpO2: 100%  97%     Intake/Output Summary (Last 24 hours) at 08/12/2020 0804 Last data filed at 08/12/2020 0300 Gross per 24 hour  Intake 580 ml  Output --  Net 580 ml   There were no vitals filed for this visit.  Examination:  General exam: No acute distress.  Looks chronically ill and deconditioned.  Currently on room air  respiratory system: Decreased breath sounds at bases bilaterally with some crackles and tachypnea cardiovascular system: S1-S2 heard, tachycardic  gastrointestinal system: Abdomen is mildly  distended, soft and nontender.  Normal bowel sounds heard  extremities: No clubbing or cyanosis; mild lower extremity edema present Central nervous system: Wakes up slightly, very slow to respond but answers more questions appropriately including the year, name and her boyfriends and son's name.  No focal neurological deficits.  Moving extremities skin: No obvious petechiae/rashes  psychiatry: Could not be assessed because of mental status  Data Reviewed: I have personally reviewed following labs and imaging studies  CBC: Recent Labs  Lab 08/09/20 1450 08/10/20 0827 08/11/20 0847 08/12/20 0433  WBC 6.3 8.4 7.0 5.0  NEUTROABS 4.2  --  4.0 2.6  HGB 7.7* 8.3* 8.2* 7.0*  HCT 23.9* 25.0* 25.6* 22.0*  MCV 92.3 91.2 90.8 92.8  PLT 204 267 305 009   Basic Metabolic Panel: Recent Labs  Lab 08/09/20 1450 08/10/20 0827 08/11/20 0847 08/12/20 0433  NA 136 142 142 145  K 2.9* 3.4* 3.8 3.6  CL 107 113* 114* 113*  CO2 19* 14* 17* 25  GLUCOSE 95 93 114* 107*  BUN 17 19 22* 27*  CREATININE 2.51* 2.48* 2.52* 2.59*  CALCIUM 8.0* 8.3* 8.4* 8.6*  MG  --   --  1.5* 1.8   GFR: Estimated Creatinine Clearance: 20.4 mL/min (A) (by C-G formula based on SCr of 2.59 mg/dL (H)). Liver Function Tests: Recent Labs  Lab 08/09/20 1450 08/10/20 0827 08/11/20 0847  AST 15 16 16   ALT 12 10 10   ALKPHOS 121 120 121  BILITOT 0.9 1.2 0.3  PROT 6.8 6.7 6.7  ALBUMIN 2.3* 2.0* 2.0*   No results for input(s): LIPASE, AMYLASE in the last 168 hours. Recent Labs  Lab 08/12/20 0433  AMMONIA 16   Coagulation Profile: No results for input(s): INR, PROTIME in the last 168 hours. Cardiac Enzymes: Recent Labs  Lab 08/09/20 1450  CKTOTAL 19*   BNP (last 3 results) No results for input(s): PROBNP in the last 8760 hours. HbA1C: No results for input(s): HGBA1C in the last 72 hours. CBG: No results for input(s): GLUCAP in the last 168 hours. Lipid Profile: No results for input(s): CHOL, HDL, LDLCALC,  TRIG, CHOLHDL, LDLDIRECT in the last 72 hours. Thyroid Function Tests: Recent Labs    08/12/20 0433  TSH 4.419   Anemia Panel: Recent Labs    08/12/20 0433  VITAMINB12 967*  FOLATE 16.3   Sepsis Labs: No results for input(s): PROCALCITON, LATICACIDVEN in the last 168 hours.  Recent Results (from the past 240 hour(s))  Resp Panel by RT-PCR (Flu A&B, Covid) Nasopharyngeal Swab     Status: None   Collection Time: 08/09/20  3:08 PM   Specimen: Nasopharyngeal Swab; Nasopharyngeal(NP) swabs in vial transport medium  Result Value Ref Range Status   SARS Coronavirus 2 by RT PCR NEGATIVE NEGATIVE Final    Comment: (NOTE) SARS-CoV-2 target nucleic acids are NOT DETECTED.  The SARS-CoV-2 RNA is generally detectable in upper respiratory specimens during the acute phase of infection. The lowest concentration of SARS-CoV-2 viral copies this assay can detect is 138 copies/mL. A negative result does not preclude SARS-Cov-2 infection and should not be used as the sole basis for treatment or other patient  management decisions. A negative result may occur with  improper specimen collection/handling, submission of specimen other than nasopharyngeal swab, presence of viral mutation(s) within the areas targeted by this assay, and inadequate number of viral copies(<138 copies/mL). A negative result must be combined with clinical observations, patient history, and epidemiological information. The expected result is Negative.  Fact Sheet for Patients:  EntrepreneurPulse.com.au  Fact Sheet for Healthcare Providers:  IncredibleEmployment.be  This test is no t yet approved or cleared by the [place] FDA and  has been authorized for detection and/or diagnosis of SARS-CoV-2 by FDA under an Emergency Use Authorization (EUA). This EUA will remain  in effect (meaning this test can be used) for the duration of the COVID-19 declaration under Section 564(b)(1) of  the Act, 21 U.S.C.section 360bbb-3(b)(1), unless the authorization is terminated  or revoked sooner.       Influenza A by PCR NEGATIVE NEGATIVE Final   Influenza B by PCR NEGATIVE NEGATIVE Final    Comment: (NOTE) The Xpert Xpress SARS-CoV-2/FLU/RSV plus assay is intended as an aid in the diagnosis of influenza from Nasopharyngeal swab specimens and should not be used as a sole basis for treatment. Nasal washings and aspirates are unacceptable for Xpert Xpress SARS-CoV-2/FLU/RSV testing.  Fact Sheet for Patients: EntrepreneurPulse.com.au  Fact Sheet for Healthcare Providers: IncredibleEmployment.be  This test is not yet approved or cleared by the [place] FDA and has been authorized for detection and/or diagnosis of SARS-CoV-2 by FDA under an Emergency Use Authorization (EUA). This EUA will remain in effect (meaning this test can be used) for the duration of the COVID-19 declaration under Section 564(b)(1) of the Act, 21 U.S.C. section 360bbb-3(b)(1), unless the authorization is terminated or revoked.  Performed at [facility], [place] [address] [place], [place] [address]   Urine Culture     Status: None   Collection Time: 08/09/20  4:28 PM   Specimen: Urine, Random  Result Value Ref Range Status   Specimen Description   Final    URINE, RANDOM Performed at [facility] [address], [place], [place] [address]    Special Requests   Final    NONE Performed at [facility], [place] [address], [place], [place] [address]    Culture   Final    NO GROWTH Performed at [facility], [place] [address], [place], [place] [address]    Report Status 08/11/2020 FINAL  Final         Radiology Studies: DG Chest 2 View  Result Date: 08/11/2020 CLINICAL DATA:  Shortness of breath EXAM: CHEST - 2 VIEW COMPARISON:  08/09/2020 FINDINGS: Cardiomegaly. Large left pleural effusion.  Small right pleural effusion. Bibasilar atelectasis. Right Port-A-Cath remains in place, unchanged. IMPRESSION: Bilateral effusions, left greater than right with bibasilar atelectasis. Cardiomegaly. Electronically Signed   By: Rolm Baptise M.D.   On: 08/11/2020 14:21   MR BRAIN WO CONTRAST  Result Date: 08/10/2020 CLINICAL DATA:  Mental status change.  Mental status change EXAM: MRI HEAD WITHOUT CONTRAST TECHNIQUE: Multiplanar, multiecho pulse sequences of the brain and surrounding structures were obtained without intravenous contrast. COMPARISON:  MRI head 04/08/2020. CT head 08/09/2020 CT head 08/09/2020 FINDINGS: Brain: Image quality degraded by motion. Rapid scanning performed due to motion. Moderate atrophy. Negative for hydrocephalus. 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 Minimal white matter changes in the cerebral hemispheres. Negative for acute infarct, hemorrhage, mass. Vascular: Normal arterial flow voids at the skull base. Skull and  upper cervical spine: No focal lesion. Sinuses/Orbits: Mucosal edema paranasal sinuses. Large retention cyst left maxillary sinus. Negative orbit Other: None IMPRESSION: Negative for acute abnormality Atrophy and chronic ischemic change most notably in the central pons. Electronically Signed   By: Franchot Gallo M.D.   On: 08/10/2020 13:13        Scheduled Meds: . Chlorhexidine Gluconate Cloth  6 each Topical Daily  . ferrous sulfate  325 mg Oral BID WC  . folic acid  1 mg Oral Daily  . LORazepam  0.5 mg Intravenous Once  . multivitamin with minerals  1 tablet Oral Daily  . nicotine  21 mg Transdermal Daily  . sodium bicarbonate  650 mg Oral TID   Continuous Infusions: . thiamine injection Stopped (08/11/20 1300)          Aline August, MD [facility] 08/12/2020, 8:04 AM

## 2020-08-12 NOTE — Progress Notes (Signed)
EEG Completed; Results Pending  
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
## 2020-08-12 NOTE — Evaluation (Signed)
Clinical/Bedside Swallow Evaluation Patient Details  Name: Jannelle Notaro MRN: [id number] Date of Birth: 01/03/1961  Today's Date: 08/12/2020 Time: SLP Start Time (ACUTE ONLY): 1255 SLP Stop Time (ACUTE ONLY): 1305 SLP Time Calculation (min) (ACUTE ONLY): 10 min  Past Medical History:  Past Medical History:  Diagnosis Date  . Breast cancer ([place])   . Depression   . Mental disorder    Past Surgical History:  Past Surgical History:  Procedure Laterality Date  . ESOPHAGOGASTRODUODENOSCOPY (EGD) WITH PROPOFOL N/A 04/09/2020   Procedure: ESOPHAGOGASTRODUODENOSCOPY (EGD) WITH PROPOFOL;  Surgeon: Doran Stabler, MD;  Location: [facility];  Service: Gastroenterology;  Laterality: N/A;  . IR IMAGING GUIDED PORT INSERTION  11/03/2019  . TUBAL LIGATION    . VIDEO BRONCHOSCOPY WITH ENDOBRONCHIAL NAVIGATION N/A 04/04/2019   Procedure: VIDEO BRONCHOSCOPY WITH ENDOBRONCHIAL NAVIGATION;  Surgeon: Garner Nash, DO;  Location: [facility];  Service: Thoracic;  Laterality: N/A;  . VIDEO BRONCHOSCOPY WITH ENDOBRONCHIAL ULTRASOUND N/A 04/04/2019   Procedure: VIDEO BRONCHOSCOPY WITH ENDOBRONCHIAL ULTRASOUND;  Surgeon: Garner Nash, DO;  Location: [facility];  Service: Thoracic;  Laterality: N/A;   HPI:  60 years old female adm with AMS, hypokalemia and acute on chronic anemia.  Recent h/o nausea and vomiting and poor oral intake. Pt also with recent adm with Bacteremia associated with intravascular line, ARF, acute GI bleed, pancytopenia.   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  Pt refused recommended SNF level rehab following last hospital admit.   Assessment / Plan / Recommendation Clinical Impression  Patient presents with an oropharyngeal dysphagia that is [facility] and without overt s/s aspiration or penetration even with successive straw sips of thin liquids. SLP spoke with RN prior to this BSE and was told that  previous date patient was very confused and was holding solids/liquids in mouth. It appears that patient has improved significantly from this confusion and SLP did not observe any oral holding of liquids, with swallow initiation appearing timely. SLP not recommending futher ST interventions at this time. SLP Visit Diagnosis: Dysphagia, unspecified (R13.10)    Aspiration Risk  No limitations    Diet Recommendation Regular;Thin liquid   Liquid Administration via: Cup;Straw Medication Administration: Whole meds with liquid Postural Changes: Seated upright at 90 degrees    Other  Recommendations Oral Care Recommendations: Oral care BID   Follow up Recommendations None      Frequency and Duration   N/A         Prognosis    N/A    Swallow Study   General Date of Onset: 08/09/20 HPI: 60 years old female adm with AMS, hypokalemia and acute on chronic anemia.  Recent h/o nausea and vomiting and poor oral intake. Pt also with recent adm with Bacteremia associated with intravascular line, ARF, acute GI bleed, pancytopenia.   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  Pt refused recommended SNF level rehab following last hospital admit. Type of Study: Bedside Swallow Evaluation Previous Swallow Assessment: None found Diet Prior to this Study: Thin liquids;Regular Temperature Spikes Noted: No Respiratory Status: Room air History of Recent Intubation: No Behavior/Cognition: Alert;Cooperative;Pleasant mood Oral Cavity Assessment: Within Functional Limits Oral Care Completed by SLP: No Oral Cavity - Dentition: Adequate natural dentition Vision: Functional for self-feeding Self-Feeding Abilities: Able to feed self Patient Positioning: Upright in bed Baseline Vocal Quality: Normal Volitional Cough:  Strong Volitional Swallow: Able to elicit    Oral/Motor/Sensory Function Overall Oral Motor/Sensory  Function: Within functional limits   Ice Chips     Thin Liquid Thin Liquid: Within functional limits Presentation: Straw;Self Fed    Nectar Thick     Honey Thick     Puree Puree: Not tested   Solid     Solid: Not tested Other Comments: patient declined solids at this time      Sonia Baller, MA, CCC-SLP Speech Therapy

## 2020-08-12 NOTE — Progress Notes (Signed)
HEMATOLOGY-ONCOLOGY PROGRESS NOTE  SUBJECTIVE: Brenda [name] is well-known to our practice.  We follow her for metastatic lung cancer.  She had a recent prolonged hospitalization from 4/11 through 5/13 for sepsis, GI bleed, pancytopenia.  She was scheduled for outpatient follow-up in our office on 5/18, but did not come to her appointment.  Now admitted for acute metabolic encephalopathy.  I saw the patient this morning in her hospital room.  Remains encephalopathic.  Cannot really answer any questions for me.  No specific complaints voiced by nursing.  Oncology History  Malignant neoplasm of right lung ([place])  09/14/2019 Initial Diagnosis   Adenocarcinoma of right lung, stage 4 ([place])   09/28/2019 -  Chemotherapy    Patient is on Treatment Plan: LUNG CARBOPLATIN / PEMETREXED / PEMBROLIZUMAB Q21D INDUCTION X 4 CYCLES / MAINTENANCE PEMETREXED + PEMBROLIZUMAB      04/17/2020 Cancer Staging   Staging form: Lung, AJCC 8th Edition - Clinical: Stage IVA (cT2a, cN2, cM1b) - Signed by Curt Bears, MD on 04/17/2020      REVIEW OF SYSTEMS:   Unable to obtain  I have reviewed the past medical history, past surgical history, social history and family history with the patient and they are unchanged from previous note.   PHYSICAL EXAMINATION: ECOG PERFORMANCE STATUS: 3 - Symptomatic, >50% confined to bed  Vitals:   08/12/20 0443 08/12/20 0500  BP: (!) 126/99   Pulse: (!) 156   Resp: 16 (!) 25  Temp: (!) 97.3 F (36.3 C)   SpO2: 97%    There were no vitals filed for this visit.  Intake/Output from previous day: 05/22 0701 - 05/23 0700 In: 580 [P.O.:480; IV Piggyback:100] Out: -   GENERAL: Chronically ill-appearing female, cachectic SKIN: skin color, texture, turgor are normal, no rashes or significant lesions EYES: normal, Conjunctiva are pink and non-injected, sclera clear OROPHARYNX:no exudate, no erythema and lips, buccal mucosa, and tongue normal  LUNGS: clear to auscultation and  percussion with normal breathing effort HEART: regular rate & rhythm and no murmurs and no lower extremity edema ABDOMEN:abdomen soft, non-tender and normal bowel sounds NEURO: Awake, does not clearly answer questions, incoherent at times  LABORATORY DATA:  I have reviewed the data as listed CMP Latest Ref Rng & Units 08/12/2020 08/11/2020 08/10/2020  Glucose 70 - 99 mg/dL 107(H) 114(H) 93  BUN 6 - 20 mg/dL 27(H) 22(H) 19  Creatinine 0.44 - 1.00 mg/dL 2.59(H) 2.52(H) 2.48(H)  Sodium 135 - 145 mmol/L 145 142 142  Potassium 3.5 - 5.1 mmol/L 3.6 3.8 3.4(L)  Chloride 98 - 111 mmol/L 113(H) 114(H) 113(H)  CO2 22 - 32 mmol/L 25 17(L) 14(L)  Calcium 8.9 - 10.3 mg/dL 8.6(L) 8.4(L) 8.3(L)  Total Protein 6.5 - 8.1 g/dL - 6.7 6.7  Total Bilirubin 0.3 - 1.2 mg/dL - 0.3 1.2  Alkaline Phos 38 - 126 U/L - 121 120  AST 15 - 41 U/L - 16 16  ALT 0 - 44 U/L - 10 10    Lab Results  Component Value Date   WBC 5.0 08/12/2020   HGB 7.0 (L) 08/12/2020   HCT 22.0 (L) 08/12/2020   MCV 92.8 08/12/2020   PLT 252 08/12/2020   NEUTROABS 2.6 08/12/2020    DG Chest 2 View  Result Date: 08/11/2020 CLINICAL DATA:  Shortness of breath EXAM: CHEST - 2 VIEW COMPARISON:  08/09/2020 FINDINGS: Cardiomegaly. Large left pleural effusion. Small right pleural effusion. Bibasilar atelectasis. Right Port-A-Cath remains in place, unchanged. IMPRESSION: Bilateral effusions, left greater  than right with bibasilar atelectasis. Cardiomegaly. Electronically Signed   By: Rolm Baptise M.D.   On: 08/11/2020 14:21   CT Head Wo Contrast  Result Date: 08/09/2020 CLINICAL DATA:  Mental status change EXAM: CT HEAD WITHOUT CONTRAST TECHNIQUE: Contiguous axial images were obtained from the base of the skull through the vertex without intravenous contrast. COMPARISON:  07/01/2020 FINDINGS: Brain: Stable atrophy and white matter microvascular changes throughout both cerebral hemispheres. No acute intracranial hemorrhage, new mass lesion,  infarction, midline shift, herniation, hydrocephalus, or extra-axial fluid collection. No focal mass effect or edema. Cisterns are patent. Cerebellar atrophy as well. Vascular: No hyperdense vessel or unexpected calcification. Skull: Normal. Negative for fracture or focal lesion. Sinuses/Orbits: No acute finding. Other: None. IMPRESSION: Stable atrophy and white matter microvascular ischemic changes. No interval change or acute process by noncontrast CT. Electronically Signed   By: Jerilynn Mages.  Shick M.D.   On: 08/09/2020 15:55   MR BRAIN WO CONTRAST  Result Date: 08/10/2020 CLINICAL DATA:  Mental status change.  Mental status change EXAM: MRI HEAD WITHOUT CONTRAST TECHNIQUE: Multiplanar, multiecho pulse sequences of the brain and surrounding structures were obtained without intravenous contrast. COMPARISON:  MRI head 04/08/2020. CT head 08/09/2020 CT head 08/09/2020 FINDINGS: Brain: Image quality degraded by motion. Rapid scanning performed due to motion. Moderate atrophy. Negative for hydrocephalus. 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 Minimal white matter changes in the cerebral hemispheres. Negative for acute infarct, hemorrhage, mass. Vascular: Normal arterial flow voids at the skull base. Skull and upper cervical spine: No focal lesion. Sinuses/Orbits: Mucosal edema paranasal sinuses. Large retention cyst left maxillary sinus. Negative orbit Other: None IMPRESSION: Negative for acute abnormality Atrophy and chronic ischemic change most notably in the central pons. Electronically Signed   By: Franchot Gallo M.D.   On: 08/10/2020 13:13   DG Chest Port 1 View  Result Date: 08/09/2020 CLINICAL DATA:  Shortness of breath. EXAM: PORTABLE CHEST 1 VIEW COMPARISON:  07/07/2020 FINDINGS: There is a right chest wall port a catheter with tip in the distal SVC. Stable cardiomediastinal contours. Moderate left pleural effusion and small right pleural effusion are again noted. Mild interstitial edema  noted. No airspace consolidation. IMPRESSION: 1. Persistent bilateral pleural effusions, left greater right. 2. Mild interstitial edema. Electronically Signed   By: Kerby Moors M.D.   On: 08/09/2020 15:07   VAS [place] UPPER EXTREMITY VENOUS DUPLEX  Result Date: 07/21/2020 UPPER VENOUS STUDY  Patient Name:  Brenda [name]  Date of Exam:   07/21/2020 Medical Rec #: [id number]       Accession #:    [id number] Date of Birth: [birth year]       Patient Gender: F Patient Age:   59Y Exam Location[facility]  [facility] Procedure:      VAS [place] UPPER EXTREMITY VENOUS DUPLEX Referring Phys: [id number] [place] --------------------------------------------------------------------------------  Indications: Edema Comparison Study: no prior Performing Technologist: Abram Sander RVS  Examination Guidelines: A complete evaluation includes B-mode imaging, spectral Doppler, color Doppler, and power Doppler as needed of all accessible portions of each vessel. Bilateral testing is considered an integral part of a complete examination. Limited examinations for reoccurring indications may be performed as noted.  Right Findings: +----------+------------+---------+-----------+----------+-------+ RIGHT     CompressiblePhasicitySpontaneousPropertiesSummary +----------+------------+---------+-----------+----------+-------+ IJV           Full       Yes       Yes                      +----------+------------+---------+-----------+----------+-------+  Subclavian    Full       Yes       Yes                      +----------+------------+---------+-----------+----------+-------+ Axillary      Full       Yes       Yes                      +----------+------------+---------+-----------+----------+-------+ Brachial      Full       Yes       Yes                      +----------+------------+---------+-----------+----------+-------+ Radial        Full                                           +----------+------------+---------+-----------+----------+-------+ Ulnar         Full                                          +----------+------------+---------+-----------+----------+-------+ Cephalic      Full                                          +----------+------------+---------+-----------+----------+-------+ Basilic       Full                                          +----------+------------+---------+-----------+----------+-------+  Left Findings: +----------+------------+---------+-----------+----------+-----------------+ LEFT      CompressiblePhasicitySpontaneousProperties     Summary      +----------+------------+---------+-----------+----------+-----------------+ IJV           Full       Yes       Yes                                +----------+------------+---------+-----------+----------+-----------------+ Subclavian    Full       Yes       Yes                                +----------+------------+---------+-----------+----------+-----------------+ Axillary      Full       Yes       Yes                                +----------+------------+---------+-----------+----------+-----------------+ Brachial      Full       Yes       Yes                                +----------+------------+---------+-----------+----------+-----------------+ Radial        Full                                                    +----------+------------+---------+-----------+----------+-----------------+  Ulnar         Full                                                    +----------+------------+---------+-----------+----------+-----------------+ Cephalic      None                                  Age Indeterminate +----------+------------+---------+-----------+----------+-----------------+ Basilic       Full                                                    +----------+------------+---------+-----------+----------+-----------------+   Summary:  Right: No evidence of deep vein thrombosis in the upper extremity. No evidence of superficial vein thrombosis in the upper extremity.  Left: Findings consistent with age indeterminate superficial vein thrombosis involving the left cephalic vein.  *See table(s) above for measurements and observations.  Diagnosing physician: Harold Barban MD Electronically signed by Harold Barban MD on 07/21/2020 at 4:26:43 PM.    Final     ASSESSMENT AND PLAN: This is a 60 year old African-American female with stage IV (T2a, N2, M1b) non-small cell lung cancer, adenocarcinoma presented with a large right upper lobe lung mass in addition to suspicious right paratracheal lymphadenopathy and bilateral pulmonary nodules as well as retroperitoneal lymphadenopathy diagnosed in May 2021.  She has no actionable mutations and is negative for PD-L1.  The patient started palliative systemic chemotherapy with carboplatin for an AUC of 5, Alimta 500 mg/m, and Keytruda 200 mg IV every 3 weeks.  Starting from cycle #5, she has been on maintenance Alimta and Keytruda.  Her Alimta dose has been dose reduced secondary to neutropenia.  She has also required intermittent holding of Alimta due to renal insufficiency.  She last received her chemotherapy on 06/18/2020.  She had a recent prolonged hospitalization and was due to follow-up with [place] on 5/18.  However, she did not make it to this appointment.  Now admitted with acute metabolic encephalopathy.  She seems to be much more deconditioned than the last time that I saw her.  I agree with continued work-up of her acute metabolic encephalopathy.  She will likely need placement upon discharge.  She was resistant to this during her last hospitalization.  With regards to her metastatic lung cancer, our plan was to discontinue her chemotherapy due to side effects and consider continuation of immunotherapy depending on her performance status.  I am concerned at this point that she may not  tolerate any additional treatment.  I agree with goals of care discussion.  CBC from today has been reviewed.  Hemoglobin is down to 7.0 today.  Recommend PRBC transfusion to keep hemoglobin above 8.  Her vitamin B12 and folate are adequate.  She has had a recent GI bleed and would recommend checking stool for occult blood and iron studies.  Consider GI consult if she has evidence of GI bleed and if consistent with her goals of care.   LOS: 3 days   Mikey Bussing, DNP, AGPCNP-BC, AOCNP 08/12/20

## 2020-08-12 NOTE — Procedures (Signed)
Patient Name: Brenda [name]  MRN: [id number]  Epilepsy Attending: Lora Havens  Referring Physician/Provider: Dr. Aline August Date: 08/12/2020 Duration: 23.57 mins  Patient history: 60 year old female with altered mental status.  EEG to evaluate for seizures.  Level of alertness: Awake  AEDs during EEG study: None  Technical aspects: This EEG study was done with scalp electrodes positioned according to the 10-20 International system of electrode placement. Electrical activity was acquired at a sampling rate of 500Hz  and reviewed with a high frequency filter of 70Hz  and a low frequency filter of 1Hz . EEG data were recorded continuously and digitally stored.   Description: The posterior dominant rhythm consists of 9 Hz activity of moderate voltage (25-35 uV) seen predominantly in posterior head regions, symmetric and reactive to eye opening and eye closing. Hyperventilation and photic stimulation were not performed.     IMPRESSION: This study is within normal limits. No seizures or epileptiform discharges were seen throughout the recording.  [name] [name]

## 2020-08-12 NOTE — TOC Initial Note (Signed)
Transition of Care Brenda [name]) - Initial/Assessment Note    Patient Details  Name: Brenda [name] MRN: [id number] Date of Birth: 20-Dec-1960  Transition of Care Brenda [name]) CM/SW Contact:    Brenda Mage, LCSW Phone Number: 08/12/2020, 4:24 PM  Clinical Narrative:   Patient seen in follow up to PT recommendation of SNF.  [name] states she has all the help she needs at home.  Brenda [name] PCS services are supposed to start on the 29th, though she doesn't know how many hours she is approved for. Also alluded to the a niece that will help her out.  With her permission, we called her SO, Brenda [name], who expressed concern for her safety and told her he thought she needed to go to rehab before coming home.  She talked about the niece with him, but when he asked her when she had talked to the niece, she could not remember.  He went on to say he was worried because she was not making any sense before she came in, she was not making it to the bathroom and there are steps she needs to go up and down.  His biggest fear is that he will come home from work after a 12 hour shift and will find she has fallen and been down for hours.  She agreed to allow me to send out SNF bed search while she continues to work at getting stronger here with the goal of going home from the hospital.  Bed search initiated. TOC will continue to follow during the course of hospitalization.                 Expected Discharge Plan: Skilled Nursing Facility Barriers to Discharge: SNF Pending bed offer   Patient Goals and CMS Choice     Choice offered to / list presented to : Brenda [name]  Expected Discharge Plan and Services Expected Discharge Plan: [place]   Discharge Planning Services: CM Consult Post Acute Care Choice: Brenda [name] arrangements for the past 2 months: Hotel/Motel                                      Prior [name] Arrangements/Services [name] arrangements for the past 2  months: Hotel/Motel Lives with:: Significant Other Patient language and need for interpreter reviewed:: Yes        Need for Family Participation in Patient Care: Yes (Comment) Care giver support system in place?: Yes (comment) Current home services: DME Criminal Activity/Legal Involvement Pertinent to Current Situation/Hospitalization: No - Comment as needed  Activities of Daily [name] Home Assistive Devices/Equipment: Other (Comment) (pt unable to answer) ADL Screening (condition at time of admission) Patient's cognitive ability adequate to safely complete daily activities?: No Is the patient deaf or have difficulty hearing?: Yes Does the patient have difficulty seeing, even when wearing glasses/contacts?: No Does the patient have difficulty concentrating, remembering, or making decisions?: Yes Patient able to express need for assistance with ADLs?: Yes Does the patient have difficulty dressing or bathing?: Yes Independently performs ADLs?: No Communication: Independent Dressing (OT): Needs assistance Is this a change from baseline?: Change from baseline, expected to last >3 days Grooming: Needs assistance Is this a change from baseline?: Change from baseline, expected to last >3 days Feeding: Needs assistance Is this a change from baseline?: Change from baseline, expected to last >3 days Bathing: Needs assistance Is this a change from baseline?: Change  from baseline, expected to last >3 days Toileting: Needs assistance Is this a change from baseline?: Change from baseline, expected to last >3days In/Out Bed: Needs assistance Is this a change from baseline?: Change from baseline, expected to last >3 days Walks in Home: Needs assistance Is this a change from baseline?: Change from baseline, expected to last >3 days Does the patient have difficulty walking or climbing stairs?: Yes Weakness of Legs: Both Weakness of Arms/Hands: Both  Permission Sought/Granted Permission sought to  share information with : Family Supports Permission granted to share information with : Yes, Verbal Permission Granted  Share Information with NAME: Brenda [name]     [phone]           Emotional Assessment Appearance:: Appears stated age Attitude/Demeanor/Rapport: Engaged Affect (typically observed): Appropriate Orientation: : Oriented to Self,Oriented to Situation Alcohol / Substance Use: Illicit Drugs,Alcohol Use Psych Involvement: No (comment)  Admission diagnosis:  Hypokalemia [E87.6] Patient Active Problem List   Diagnosis Date Noted  . Bacteremia associated with intravascular line ([place]) 07/03/2020  . Acute blood loss anemia   . Renal failure 07/01/2020  . AKI (acute kidney injury) ([place])   . Metabolic acidosis   . Epistaxis   . Seizure-like activity ([place])   . Swelling of lower extremity 05/29/2020  . Hypoalbuminemia 05/29/2020  . Acute on chronic anemia 04/07/2020  . Gastrointestinal hemorrhage 04/07/2020  . Acute encephalopathy 04/07/2020  . Pancytopenia ([place]) 04/07/2020  . Polysubstance abuse ([place]) 04/07/2020  . Hypokalemia 10/19/2019  . Neutropenia ([place]) 10/05/2019  . Malignant neoplasm of right lung ([place]) 09/14/2019  . Encounter for antineoplastic chemotherapy 09/14/2019  . Encounter for antineoplastic immunotherapy 09/14/2019  . Goals of care, counseling/discussion 09/14/2019  . Tobacco abuse counseling 09/14/2019  . 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 ([place])   . Alcohol abuse   . Acute respiratory failure ([place]) 04/03/2019  . Lung mass   . Multifocal pneumonia   . Major depressive disorder   . Major depressive disorder, recurrent episode with mood-congruent psychotic features ([place]) 05/30/2017  . 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 [place] [address] Phone: [phone] Fax: [phone]     Social Determinants of Health  (SDOH) Interventions    Readmission Risk Interventions Readmission Risk Prevention Plan 07/17/2020 04/11/2020  Transportation Screening Complete Complete  PCP or Specialist Appt within 3-5 Days - Complete  HRI or [place] Complete Complete  Social Work Consult for [place] Planning/Counseling Complete Complete  Palliative Care Screening Not Applicable Not Applicable  Medication Review Press photographer) Complete Complete  Some recent data might be hidden

## 2020-08-12 NOTE — Progress Notes (Signed)
   Mews Score: Yellow. Patient on Yellow mews before. No changes noted. Will continue to monitor.     08/11/20 2026  Assess: MEWS Score  Temp (!) 97.5 F (36.4 C)  BP 115/75  Pulse Rate (!) 117  Resp (!) 22  SpO2 100 %  O2 Device Room Air  Assess: MEWS Score  MEWS Temp 0  MEWS Systolic 0  MEWS Pulse 2  MEWS RR 1  MEWS LOC 0  MEWS Score 3  MEWS Score Color Yellow  Assess: if the MEWS score is Yellow or Red  Were vital signs taken at a resting state? Yes  Focused Assessment No change from prior assessment  Does the patient meet 2 or more of the SIRS criteria? No  Does the patient have a confirmed or suspected source of infection? No  Provider and Rapid Response Notified? No  Early Detection of Sepsis Score *See Row Information* Low  MEWS guidelines implemented *See Row Information* No, previously yellow, continue vital signs every 4 hours  Treat  Pain Scale 0-10  Pain Score 0  Faces Pain Scale 0  Take Vital Signs  Increase Vital Sign Frequency  Yellow: Q 2hr X 2 then Q 4hr X 2, if remains yellow, continue Q 4hrs  Escalate  MEWS: Escalate Yellow: discuss with charge nurse/RN and consider discussing with provider and RRT  Notify: Charge Nurse/RN  Name of Charge Nurse/RN Notified Renee, RN  Date Charge Nurse/RN Notified 08/11/20  Time Charge Nurse/RN Notified 2045  Document  Patient Outcome Other (Comment) (no changes from previous condition)  Progress note created (see row info) Yes  Assess: SIRS CRITERIA  SIRS Temperature  0  SIRS Pulse 1  SIRS Respirations  1  SIRS WBC 0  SIRS Score Sum  2

## 2020-08-12 NOTE — Progress Notes (Deleted)
[facility] OFFICE PROGRESS NOTE  Pcp, No No address on file  DIAGNOSIS: Stage IV (T2 a, N2, M1 B) non-small cell lung cancer, adenocarcinoma presented with large right upper lobe lung mass in addition to suspicious right paratracheal lymphadenopathy and bilateral pulmonary nodules as well as retroperitoneal lymphadenopathy diagnosed in May 2021.  The patient has no actionable mutations and has negative PD-L1 expression on molecular studies by foundation 1.  PRIOR THERAPY: None  CURRENT THERAPY: Systemic chemotherapy with carboplatin for an AUC of 5, Alimta 500 mg/m2, and Keytruda 200 mg IV every 3 weeks. First dose 09/28/2019. Status post 11 cycles. Starting from cycle #5 she has been on maintenance alimta and [place]. She is on a reduced dose of alimta due to neutropenia.  Alimta was discontinued officially starting from cycle #13 due to intolerance.  INTERVAL HISTORY: Patsey Pitstick 60 y.o. female returns to clinic today for follow-up visit.  The patient was hospitalized from 07/01/2020 until 07/31/20.  The patient was admitted to the hospital ater presenting to the ED with epistaxis, pancytopenia and renal failure. In the ED she was noted to have a seizure.Marland KitchenMarland KitchenMarland KitchenLabs were significant for creatinine of 5.56 , total white blood cell count of 0.7, hemoglobin of 2.4, and platelets less than 5000.   Her drug screen also was positive for cocaine.  CT head no acute changes which was checked due to seizure like activity, solmulence, and hyporeactive.  Patient has received PRBC and platelet transfusions.  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  Patient's kidney function has also improved with supportive medical therapy.  RBC scan showed bleeding at the small bowel,  GI was consulted recommended supportive noninvasive approach.  Patient hospital course was complicated by coagulase-negative staph bacteremia, she has received and  completed antibiotics. The patient declined to go to a skilled nursing facility, which was recommended by PT, it was explained extensively the consequences of being at home unsupervised including falls, injury, and death.  Since being discharged in the hospital the patient _ .  Fevers, chills, night sweats, or weight loss.  Rashes or skin changes abnormal bleeding or bruising including epistaxis, gingival bleeding, hemoptysis, hematemesis, melena, hematochezia, or hematuria.  Breathing is _.  She is currently living _.  The patient is here today for evaluation before considering resuming her treatment which would only include single agent immunotherapy with Keytruda.  MEDICAL HISTORY: Past Medical History:  Diagnosis Date  . Breast cancer ([place])   . Depression   . Mental disorder     ALLERGIES:  is allergic to aspirin adult low [aspirin].  MEDICATIONS:  No current facility-administered medications for this visit.   No current outpatient medications on file.   Facility-Administered Medications Ordered in Other Visits  Medication Dose Route Frequency Provider Last Rate Last Admin  . Chlorhexidine Gluconate Cloth 2 % PADS 6 each  6 each Topical Daily Aline August, MD   6 each at 08/11/20 1000  . ferrous sulfate tablet 325 mg  325 mg Oral BID WC Aline August, MD   325 mg at 08/10/20 1804  . folic acid (FOLVITE) tablet 1 mg  1 mg Oral Daily Starla Link, Kshitiz, MD   1 mg at 08/10/20 1353  . labetalol (NORMODYNE) injection 5 mg  5 mg Intravenous Q4H PRN Aline August, MD   5 mg at 08/11/20 1336  . LORazepam (ATIVAN) injection 0.5 mg  0.5 mg Intravenous Once Aline August, MD      .  LORazepam (ATIVAN) injection 0.5-1 mg  0.5-1 mg Intravenous Q4H PRN Aline August, MD   1 mg at 08/11/20 2346  . multivitamin with minerals tablet 1 tablet  1 tablet Oral Daily Aline August, MD   1 tablet at 08/10/20 1353  . nicotine (NICODERM CQ - dosed in mg/24 hours) patch 21 mg  21 mg Transdermal Daily Gala Romney L, MD   21 mg at 08/11/20 1200  . ondansetron (ZOFRAN) tablet 4 mg  4 mg Oral Q6H PRN Elwyn Reach, MD       Or  . ondansetron (ZOFRAN) injection 4 mg  4 mg Intravenous Q6H PRN Elwyn Reach, MD   4 mg at 08/10/20 1542  . sodium bicarbonate tablet 650 mg  650 mg Oral TID Aline August, MD   650 mg at 08/10/20 2236  . sodium chloride flush (NS) 0.9 % injection 10-40 mL  10-40 mL Intracatheter PRN Starla Link, Kshitiz, MD      . thiamine 525m in normal saline (516m IVPB  500 mg Intravenous Daily AlStarla LinkKshitiz, MD   Stopping Infusion hung by another clincian at 08/11/20 1300    SURGICAL HISTORY:  Past Surgical History:  Procedure Laterality Date  . ESOPHAGOGASTRODUODENOSCOPY (EGD) WITH PROPOFOL N/A 04/09/2020   Procedure: ESOPHAGOGASTRODUODENOSCOPY (EGD) WITH PROPOFOL;  Surgeon: DaDoran StablerMD;  [facility] Service: Gastroenterology;  Laterality: N/A;  . IR IMAGING GUIDED PORT INSERTION  11/03/2019  . TUBAL LIGATION    . VIDEO BRONCHOSCOPY WITH ENDOBRONCHIAL NAVIGATION N/A 04/04/2019   Procedure: VIDEO BRONCHOSCOPY WITH ENDOBRONCHIAL NAVIGATION;  Surgeon: IcGarner NashDO;  [facility]: Thor[facility];  Laterality: N/A;  . VIDEO BRONCHOSCOPY WITH ENDOBRONCHIAL ULTRASOUND N/A 04/04/2019   Procedure: VIDEO BRONCHOSCOPY WITH ENDOBRONCHIAL ULTRASOUND;  Surgeon: IcGarner NashDO;  [facility] Service: Thoracic;  Laterality: N/A;    REVIEW OF SYSTEMS:   Review of Systems  Constitutional: Negative for appetite change, chills, fatigue, fever and unexpected weight change.  HENT:   Negative for mouth sores, nosebleeds, sore throat and trouble swallowing.   Eyes: Negative for eye problems and icterus.  Respiratory: Negative for cough, hemoptysis, shortness of breath and wheezing.   Cardiovascular: Negative for chest pain and leg swelling.  Gastrointestinal: Negative for abdominal pain, constipation, diarrhea, nausea and vomiting.  Genitourinary:  Negative for bladder incontinence, difficulty urinating, dysuria, frequency and hematuria.   Musculoskeletal: Negative for back pain, gait problem, neck pain and neck stiffness.  Skin: Negative for itching and rash.  Neurological: Negative for dizziness, extremity weakness, gait problem, headaches, light-headedness and seizures.  Hematological: Negative for adenopathy. Does not bruise/bleed easily.  Psychiatric/Behavioral: Negative for confusion, depression and sleep disturbance. The patient is not nervous/anxious.     PHYSICAL EXAMINATION:  Last menstrual period 01/19/2011.  ECOG PERFORMANCE STATUS: {CHL ONC ECOG [id number] Exam  Constitutional: Oriented to person, place, and time and well-developed, well-nourished, and in no distress. No distress.  HENT:  Head: Normocephalic and atraumatic.  Mouth/Throat: Oropharynx is clear and moist. No oropharyngeal exudate.  Eyes: Conjunctivae are normal. Right eye exhibits no discharge. Left eye exhibits no discharge. No scleral icterus.  Neck: Normal range of motion. Neck supple.  Cardiovascular: Normal rate, regular rhythm, normal heart sounds and intact distal pulses.   Pulmonary/Chest: Effort normal and breath sounds normal. No respiratory distress. No wheezes. No rales.  Abdominal: Soft. Bowel sounds are normal. Exhibits no distension and no mass. There is no tenderness.  Musculoskeletal:  Normal range of motion. Exhibits no edema.  Lymphadenopathy:    No cervical adenopathy.  Neurological: Alert and oriented to person, place, and time. Exhibits normal muscle tone. Gait normal. Coordination normal.  Skin: Skin is warm and dry. No rash noted. Not diaphoretic. No erythema. No pallor.  Psychiatric: Mood, memory and judgment normal.  Vitals reviewed.  LABORATORY DATA: Lab Results  Component Value Date   WBC 5.0 08/12/2020   HGB 7.0 (L) 08/12/2020   HCT 22.0 (L) 08/12/2020   MCV 92.8 08/12/2020   PLT 252 08/12/2020       Chemistry      Component Value Date/Time   NA 145 08/12/2020 0433   K 3.6 08/12/2020 0433   CL 113 (H) 08/12/2020 0433   CO2 25 08/12/2020 0433   BUN 27 (H) 08/12/2020 0433   CREATININE 2.59 (H) 08/12/2020 0433   CREATININE 1.33 (H) 06/18/2020 1126      Component Value Date/Time   CALCIUM 8.6 (L) 08/12/2020 0433   ALKPHOS 121 08/11/2020 0847   AST 16 08/11/2020 0847   AST 22 06/18/2020 1126   ALT 10 08/11/2020 0847   ALT 10 06/18/2020 1126   BILITOT 0.3 08/11/2020 0847   BILITOT 0.3 06/18/2020 1126       RADIOGRAPHIC STUDIES:  DG Chest 2 View  Result Date: 08/11/2020 CLINICAL DATA:  Shortness of breath EXAM: CHEST - 2 VIEW COMPARISON:  08/09/2020 FINDINGS: Cardiomegaly. Large left pleural effusion. Small right pleural effusion. Bibasilar atelectasis. Right Port-A-Cath remains in place, unchanged. IMPRESSION: Bilateral effusions, left greater than right with bibasilar atelectasis. Cardiomegaly. Electronically Signed   By: Rolm Baptise M.D.   On: 08/11/2020 14:21   CT Head Wo Contrast  Result Date: 08/09/2020 CLINICAL DATA:  Mental status change EXAM: CT HEAD WITHOUT CONTRAST TECHNIQUE: Contiguous axial images were obtained from the base of the skull through the vertex without intravenous contrast. COMPARISON:  07/01/2020 FINDINGS: Brain: Stable atrophy and white matter microvascular changes throughout both cerebral hemispheres. No acute intracranial hemorrhage, new mass lesion, infarction, midline shift, herniation, hydrocephalus, or extra-axial fluid collection. No focal mass effect or edema. Cisterns are patent. Cerebellar atrophy as well. Vascular: No hyperdense vessel or unexpected calcification. Skull: Normal. Negative for fracture or focal lesion. Sinuses/Orbits: No acute finding. Other: None. IMPRESSION: Stable atrophy and white matter microvascular ischemic changes. No interval change or acute process by noncontrast CT. Electronically Signed   By: Jerilynn Mages.  Shick M.D.   On:  08/09/2020 15:55   MR BRAIN WO CONTRAST  Result Date: 08/10/2020 CLINICAL DATA:  Mental status change.  Mental status change EXAM: MRI HEAD WITHOUT CONTRAST TECHNIQUE: Multiplanar, multiecho pulse sequences of the brain and surrounding structures were obtained without intravenous contrast. COMPARISON:  MRI head 04/08/2020. CT head 08/09/2020 CT head 08/09/2020 FINDINGS: Brain: Image quality degraded by motion. Rapid scanning performed due to motion. Moderate atrophy. Negative for hydrocephalus. 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 Minimal white matter changes in the cerebral hemispheres. Negative for acute infarct, hemorrhage, mass. Vascular: Normal arterial flow voids at the skull base. Skull and upper cervical spine: No focal lesion. Sinuses/Orbits: Mucosal edema paranasal sinuses. Large retention cyst left maxillary sinus. Negative orbit Other: None IMPRESSION: Negative for acute abnormality Atrophy and chronic ischemic change most notably in the central pons. Electronically Signed   By: Franchot Gallo M.D.   On: 08/10/2020 13:13   DG Chest Port 1 View  Result Date: 08/09/2020 CLINICAL DATA:  Shortness of breath. EXAM: PORTABLE CHEST 1  VIEW COMPARISON:  07/07/2020 FINDINGS: There is a right chest wall port a catheter with tip in the distal SVC. Stable cardiomediastinal contours. Moderate left pleural effusion and small right pleural effusion are again noted. Mild interstitial edema noted. No airspace consolidation. IMPRESSION: 1. Persistent bilateral pleural effusions, left greater right. 2. Mild interstitial edema. Electronically Signed   By: Kerby Moors M.D.   On: 08/09/2020 15:07   VAS [place] UPPER EXTREMITY VENOUS DUPLEX  Result Date: 07/21/2020 UPPER VENOUS STUDY  Patient Name:  MADIA CARVELL  Date of Exam:   07/21/2020 Medical Rec #: [id number]       Accession #:    [id number] Date of Birth: Dec 11, 1960       Patient Gender: F Patient Age:   59Y Exam Location:  [facility] Procedure:      VAS [place] UPPER EXTREMITY VENOUS DUPLEX Referring Phys: [id number] [place] --------------------------------------------------------------------------------  Indications: Edema Comparison Study: no prior Performing Technologist: Abram Sander RVS  Examination Guidelines: A complete evaluation includes B-mode imaging, spectral Doppler, color Doppler, and power Doppler as needed of all accessible portions of each vessel. Bilateral testing is considered an integral part of a complete examination. Limited examinations for reoccurring indications may be performed as noted.  Right Findings: +----------+------------+---------+-----------+----------+-------+ RIGHT     CompressiblePhasicitySpontaneousPropertiesSummary +----------+------------+---------+-----------+----------+-------+ IJV           Full       Yes       Yes                      +----------+------------+---------+-----------+----------+-------+ Subclavian    Full       Yes       Yes                      +----------+------------+---------+-----------+----------+-------+ Axillary      Full       Yes       Yes                      +----------+------------+---------+-----------+----------+-------+ Brachial      Full       Yes       Yes                      +----------+------------+---------+-----------+----------+-------+ Radial        Full                                          +----------+------------+---------+-----------+----------+-------+ Ulnar         Full                                          +----------+------------+---------+-----------+----------+-------+ Cephalic      Full                                          +----------+------------+---------+-----------+----------+-------+ Basilic       Full                                          +----------+------------+---------+-----------+----------+-------+  Left Findings:  +----------+------------+---------+-----------+----------+-----------------+ LEFT      CompressiblePhasicitySpontaneousProperties     Summary      +----------+------------+---------+-----------+----------+-----------------+ IJV           Full       Yes       Yes                                +----------+------------+---------+-----------+----------+-----------------+ Subclavian    Full       Yes       Yes                                +----------+------------+---------+-----------+----------+-----------------+ Axillary      Full       Yes       Yes                                +----------+------------+---------+-----------+----------+-----------------+ Brachial      Full       Yes       Yes                                +----------+------------+---------+-----------+----------+-----------------+ Radial        Full                                                    +----------+------------+---------+-----------+----------+-----------------+ Ulnar         Full                                                    +----------+------------+---------+-----------+----------+-----------------+ Cephalic      None                                  Age Indeterminate +----------+------------+---------+-----------+----------+-----------------+ Basilic       Full                                                    +----------+------------+---------+-----------+----------+-----------------+  Summary:  Right: No evidence of deep vein thrombosis in the upper extremity. No evidence of superficial vein thrombosis in the upper extremity.  Left: Findings consistent with age indeterminate superficial vein thrombosis involving the left cephalic vein.  *See table(s) above for measurements and observations.  Diagnosing physician: Harold Barban MD Electronically signed by Harold Barban MD on 07/21/2020 at 4:26:43 PM.    Final      ASSESSMENT/PLAN:  This is a very pleasant 60  year old African-American female recently diagnosed with a stage IV (T2 a, N2, M1 B) non-small cell lung cancer, adenocarcinoma presented with large right upper lobe lung mass in addition to suspicious right paratracheal lymphadenopathy and bilateral pulmonary nodules as well as retroperitoneal lymphadenopathy diagnosed in May 2021.  The patient has no actionable mutations  and has negative PD-L1 expression on molecular studies by foundation 1.   The patient is currently undergoing palliative systemic chemotherapy with carboplatin for AUC of 5, Alimta 500 mg/M2 and Keytruda 200 mg IV every 3 weeks. She is status post 12 cycles. Starting from cycle #5, she has been on maintenance alimta and Keytruda. Her dose of alimta was reduced due to neutropenia.   Her Alimta was occasionally held secondary to renal insufficiency.  The patient was hospitalized after cycle #12 due to severe pancytopenia, GI bleed, seizure-like activity, and renal failure.   Labs were significant for creatinine of 5.56 , total white blood cell count of 0.7, hemoglobin of 2.4, and platelets less than 5000.   Her drug screen also was positive for cocaine.  CT head no acute changes which was checked due to seizure like activity, solmulence, and hyporeactive.  Patient has received PRBC and platelet transfusions.  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  Patient's kidney function has also improved with supportive medical therapy.  RBC scan showed bleeding at the small bowel,  GI was consulted recommended supportive noninvasive approach.  Patient hospital course was complicated by coagulase-negative staph bacteremia, she has received and completed antibiotics. The patient declined to go to a skilled nursing facility, which was recommended by PT,   The patient was seen with Dr. Julien Nordmann today.  Dr. Julien Nordmann had a lengthy discussion with the patient about her current condition and  recommended treatment options.  Dr. Julien Nordmann will permanently discontinue her Alimta and she will proceed on single agent immunotherapy with Keytruda starting from cycle #13.  Her labs from today show _.  Dr. Julien Nordmann recommends that she _with cycle #13 today scheduled.  We will see her back for follow-up visit in 3 weeks for evaluation before starting cycle #14.  Home health aide?  I have previously referred her to the internal medicine residency clinic to establish care since she does not have a PCP. No orders of the defined types were placed in this encounter.    I spent {CHL ONC TIME VISIT - RXYVO:[id number]} counseling the patient face to face. The total time spent in the appointment was {CHL ONC TIME VISIT - MNOTR:[id number]}.  Adiel Mcnamara L Tyshae Stair, PA-C 08/12/20

## 2020-08-12 NOTE — Progress Notes (Signed)
Called blood bank regarding status of unit of PRBC's needed to transfuse patient. Product still not ready at this time.

## 2020-08-13 ENCOUNTER — Inpatient Hospital Stay (HOSPITAL_COMMUNITY): Payer: Medicaid Other

## 2020-08-13 DIAGNOSIS — N184 Chronic kidney disease, stage 4 (severe): Secondary | ICD-10-CM

## 2020-08-13 DIAGNOSIS — R0609 Other forms of dyspnea: Secondary | ICD-10-CM

## 2020-08-13 LAB — BASIC METABOLIC PANEL
Anion gap: 7 (ref 5–15)
BUN: 30 mg/dL — ABNORMAL HIGH (ref 6–20)
CO2: 24 mmol/L (ref 22–32)
Calcium: 8.3 mg/dL — ABNORMAL LOW (ref 8.9–10.3)
Chloride: 106 mmol/L (ref 98–111)
Creatinine, Ser: 2.8 mg/dL — ABNORMAL HIGH (ref 0.44–1.00)
GFR, Estimated: 19 mL/min — ABNORMAL LOW (ref >60)
Glucose, Bld: 84 mg/dL (ref 70–99)
Potassium: 3.7 mmol/L (ref 3.5–5.1)
Sodium: 137 mmol/L (ref 135–145)

## 2020-08-13 LAB — MAGNESIUM: Magnesium: 1.6 mg/dL — ABNORMAL LOW (ref 1.7–2.4)

## 2020-08-13 LAB — CBC WITH DIFFERENTIAL/PLATELET
Abs Immature Granulocytes: 0.19 10*3/uL — ABNORMAL HIGH (ref 0.00–0.07)
Basophils Absolute: 0 10*3/uL (ref 0.0–0.1)
Basophils Relative: 0 %
Eosinophils Absolute: 0 10*3/uL (ref 0.0–0.5)
Eosinophils Relative: 0 %
HCT: 25.8 % — ABNORMAL LOW (ref 36.0–46.0)
Hemoglobin: 8.5 g/dL — ABNORMAL LOW (ref 12.0–15.0)
Immature Granulocytes: 3 %
Lymphocytes Relative: 32 %
Lymphs Abs: 1.9 10*3/uL (ref 0.7–4.0)
MCH: 30.4 pg (ref 26.0–34.0)
MCHC: 32.9 g/dL (ref 30.0–36.0)
MCV: 92.1 fL (ref 80.0–100.0)
Monocytes Absolute: 1.4 10*3/uL — ABNORMAL HIGH (ref 0.1–1.0)
Monocytes Relative: 23 %
Neutro Abs: 2.5 10*3/uL (ref 1.7–7.7)
Neutrophils Relative %: 42 %
Platelets: 238 10*3/uL (ref 150–400)
RBC: 2.8 MIL/uL — ABNORMAL LOW (ref 3.87–5.11)
RDW: 15.5 % (ref 11.5–15.5)
WBC: 6 10*3/uL (ref 4.0–10.5)
nRBC: 0 % (ref 0.0–0.2)

## 2020-08-13 LAB — ECHOCARDIOGRAM COMPLETE
AR max vel: 2.96 cm2
AV Area VTI: 2.77 cm2
AV Area mean vel: 2.79 cm2
AV Mean grad: 3 mmHg
AV Peak grad: 5.3 mmHg
Ao pk vel: 1.15 m/s
Area-P 1/2: 5.23 cm2
Calc EF: 34.1 %
MV VTI: 2.07 cm2
S' Lateral: 3.6 cm
Single Plane A2C EF: 27.9 %
Single Plane A4C EF: 39.2 %
Weight: 1834.23 oz

## 2020-08-13 LAB — BPAM RBC
Blood Product Expiration Date: 202206262359
ISSUE DATE / TIME: 202205232308
Unit Type and Rh: 5100

## 2020-08-13 LAB — TYPE AND SCREEN
ABO/RH(D): O POS
Antibody Screen: POSITIVE
Donor AG Type: NEGATIVE
Unit division: 0

## 2020-08-13 LAB — FERRITIN: Ferritin: 1453 ng/mL — ABNORMAL HIGH (ref 11–307)

## 2020-08-13 LAB — IRON AND TIBC
Iron: 50 ug/dL (ref 28–170)
Saturation Ratios: 34 % — ABNORMAL HIGH (ref 10.4–31.8)
TIBC: 146 ug/dL — ABNORMAL LOW (ref 250–450)
UIBC: 96 ug/dL

## 2020-08-13 IMAGING — DX DG ABDOMEN 2V
2 series · 2 of 2 positions shown · non-contrast
Comparison: CT chest, abdomen and pelvis [DATE].

CLINICAL DATA: Altered mental status. Abdominal distension,
weakness and shortness of breath.

EXAM:
ABDOMEN - 2 VIEW

[abdomen erect]
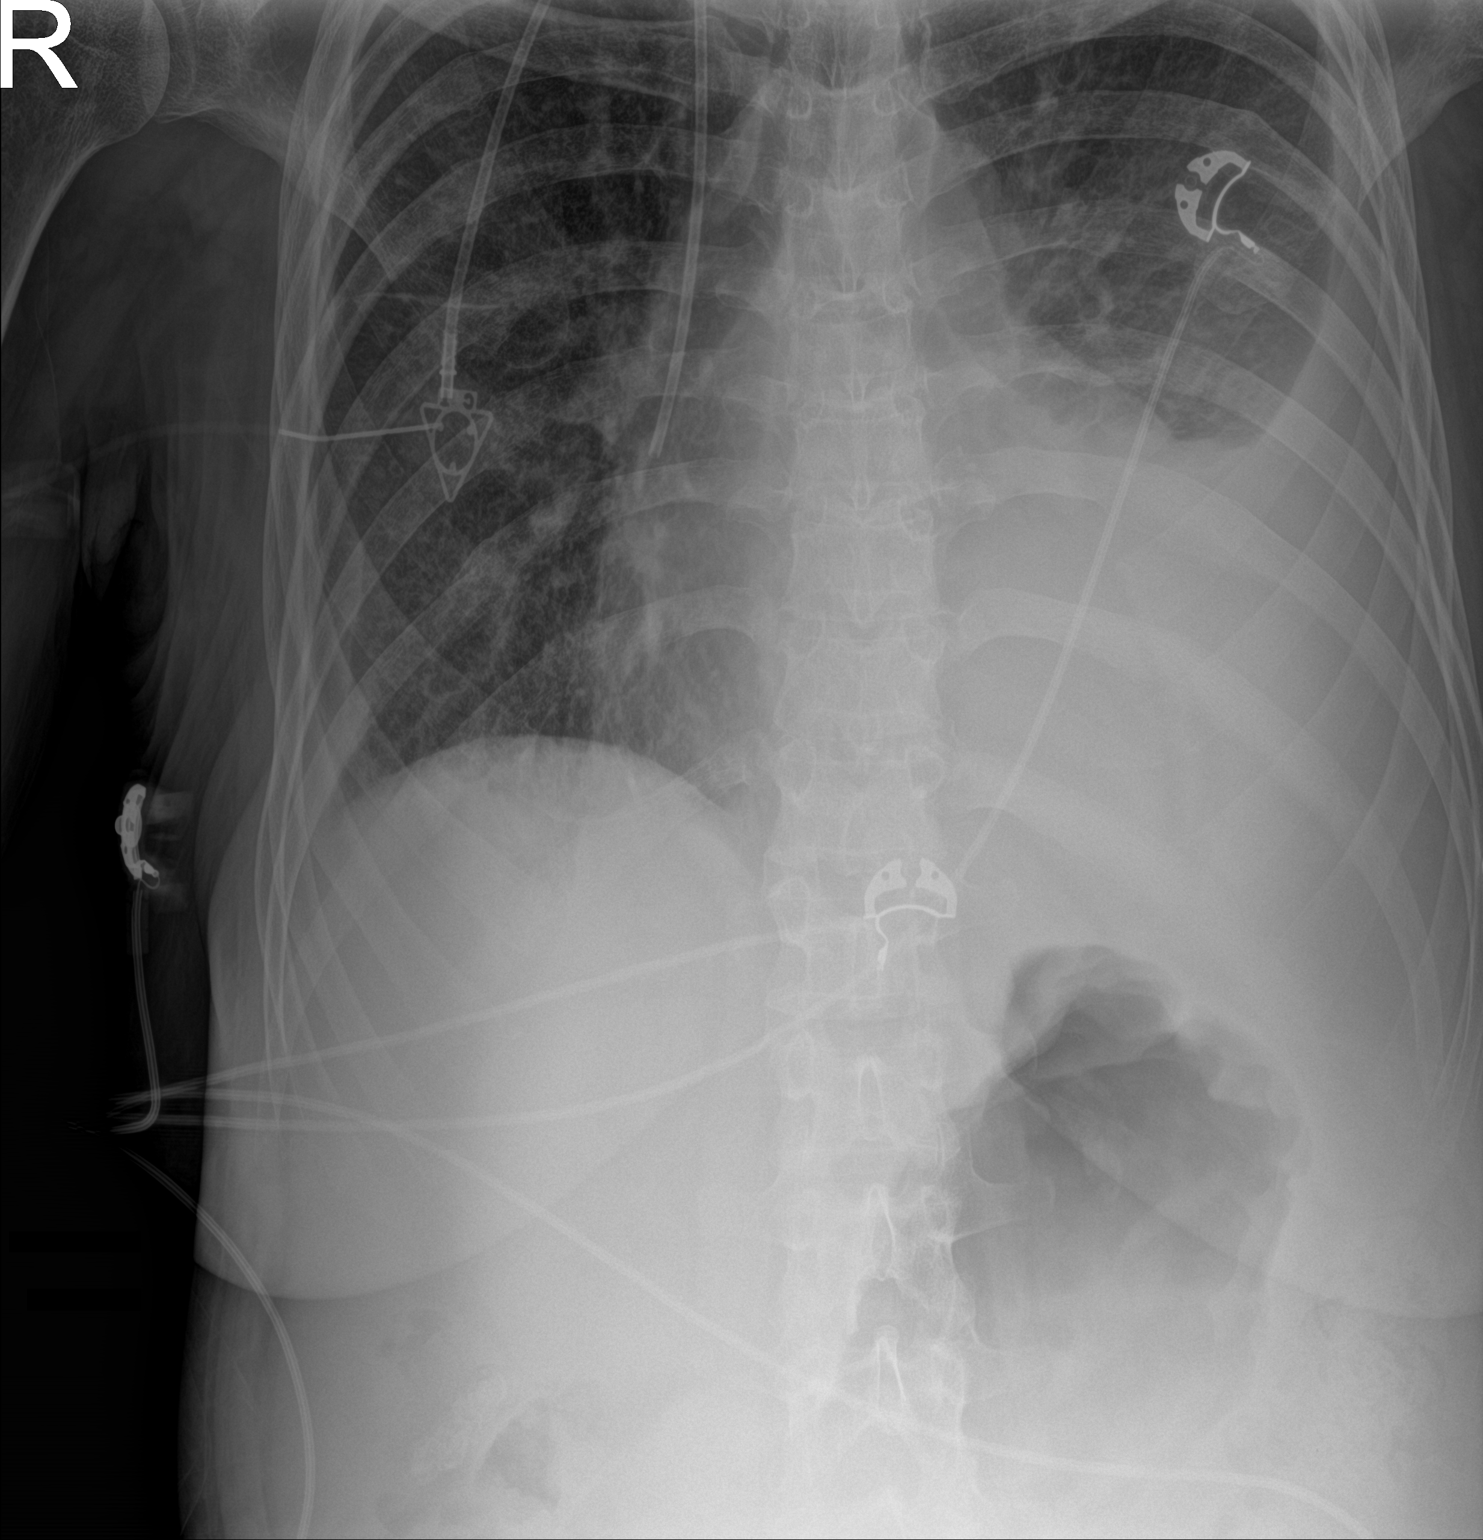

[abdomen supine]
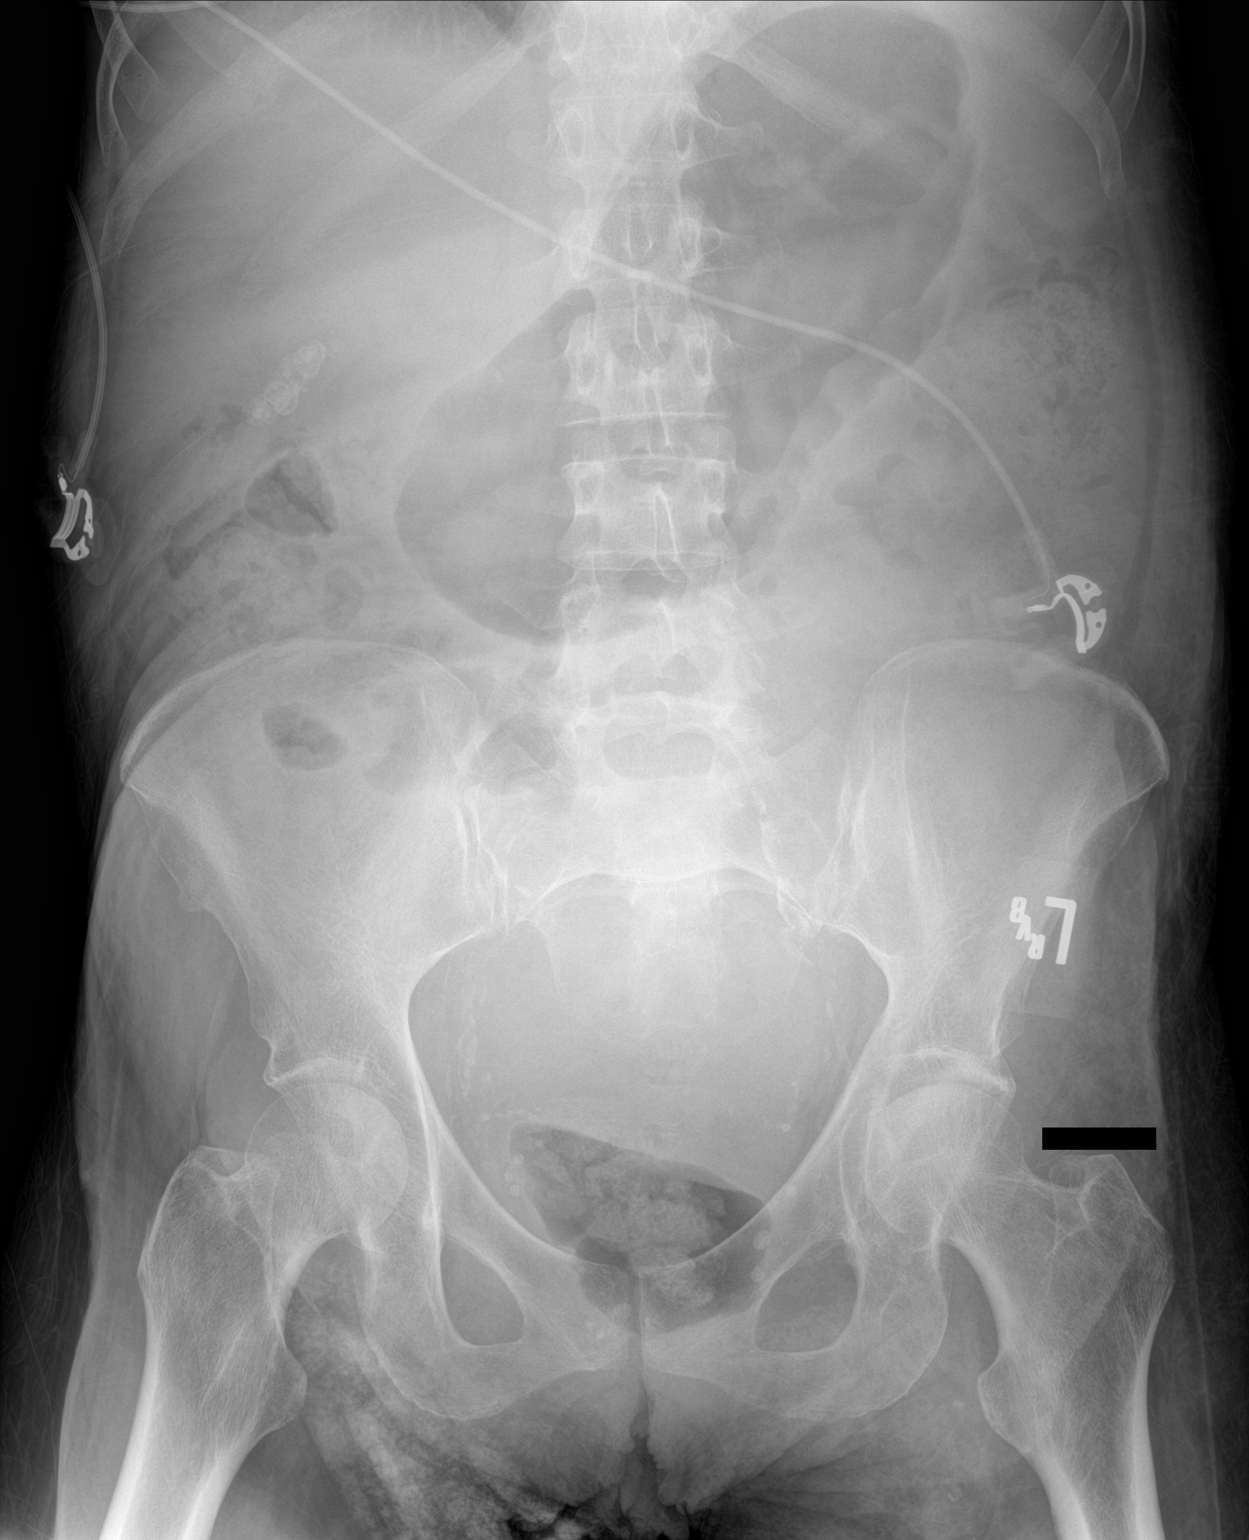

[2 of 2 positions shown; findings below may reference images not displayed]

FINDINGS: There is moderate gaseous distention of stomach. No small bowel
distention is identified. Gas and stool are seen scattered through
the colon. Small gallstones are noted. No free intraperitoneal air.
Left pleural effusion and basilar airspace disease are seen.
IMPRESSION: Moderate gaseous distention of the stomach.

Negative for free intraperitoneal air or bowel obstruction.

Left pleural effusion and basilar airspace disease.

Gallstones as seen on prior CT.

## 2020-08-13 MED ORDER — TRAMADOL HCL 50 MG PO TABS
50.0000 mg | ORAL_TABLET | Freq: Four times a day (QID) | ORAL | Status: DC | PRN
  Administered 2020-08-13 – 2020-08-15 (×3): 50 mg via ORAL
  Filled 2020-08-13 (×3): qty 1

## 2020-08-13 MED ORDER — THIAMINE HCL 100 MG/ML IJ SOLN
250.0000 mg | Freq: Every day | INTRAVENOUS | Status: AC
  Administered 2020-08-13 – 2020-08-15 (×3): 250 mg via INTRAVENOUS
  Filled 2020-08-13 (×3): qty 2.5

## 2020-08-13 MED ORDER — MAGNESIUM SULFATE 2 GM/50ML IV SOLN
2.0000 g | Freq: Once | INTRAVENOUS | Status: AC
  Administered 2020-08-13: 2 g via INTRAVENOUS
  Filled 2020-08-13: qty 50

## 2020-08-13 NOTE — Progress Notes (Signed)
*  PRELIMINARY RESULTS* Echocardiogram 2D Echocardiogram has been performed.  Luisa Hart RDCS 08/13/2020, 3:25 PM

## 2020-08-13 NOTE — Progress Notes (Signed)
Physical Therapy Treatment Patient Details Name: Brenda [name] MRN: [id number] DOB: 1960/10/30 Today's Date: 08/13/2020    History of Present Illness 60 years old female adm with AMS, hypokalemia and acute on chronic anemia.  Pt also with recent adm with Bacteremia associated with intravascular line, ARF, acute GI bleed, pancytopenia.   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  Pt refused recommended SNF level rehab following last hospital admit.    PT Comments    Pt assisted with ambulating short distance in hallway.  Pt overall appears more stable and requiring less assist then previous session.  Pt making good progress however would still benefit from SNF upon d/c.  Follow Up Recommendations  SNF     Equipment Recommendations  None recommended by PT    Recommendations for Other Services       Precautions / Restrictions Precautions Precautions: Fall    Mobility  Bed Mobility Overal bed mobility: Needs Assistance Bed Mobility: Supine to Sit     Supine to sit: Supervision;HOB elevated          Transfers Overall transfer level: Needs assistance Equipment used: Rolling walker (2 wheeled) Transfers: Sit to/from Stand Sit to Stand: Min guard         General transfer comment: verbal cues for hand placement  Ambulation/Gait Ambulation/Gait assistance: Min guard Gait Distance (Feet): 60 Feet Assistive device: Rolling walker (2 wheeled) Gait Pattern/deviations: Step-through pattern;Decreased stride length;Trunk flexed     General Gait Details: Cues for posture, position from [employer]             Wheelchair Mobility    Modified Rankin (Stroke Patients Only)       Balance                                            Cognition Arousal/Alertness: Awake/alert Behavior During Therapy: WFL for tasks assessed/performed;Flat affect Overall  Cognitive Status: Within Functional Limits for tasks assessed                                        Exercises      General Comments        Pertinent Vitals/Pain Pain Assessment: No/denies pain    Home Living                      Prior Function            PT Goals (current goals can now be found in the care plan section) Progress towards PT goals: Progressing toward goals    Frequency    Min 2X/week      PT Plan Current plan remains appropriate    Co-evaluation              AM-PAC PT "6 Clicks" Mobility   Outcome Measure  Help needed turning from your back to your side while in a flat bed without using bedrails?: A Little Help needed moving from lying on your back to sitting on the side of a flat bed without using bedrails?: A Little Help needed moving to and from a bed to a chair (including a wheelchair)?: A Little Help needed standing up from a chair using your arms (  e.g., wheelchair or bedside chair)?: A Little Help needed to walk in hospital room?: A Little Help needed climbing 3-5 steps with a railing? : A Lot 6 Click Score: 17    End of Session Equipment Utilized During Treatment: Gait belt Activity Tolerance: Patient tolerated treatment well Patient left: in chair;with call bell/phone within reach;with chair alarm set Nurse Communication: Mobility status PT Visit Diagnosis: Muscle weakness (generalized) (M62.81);Difficulty in walking, not elsewhere classified (R26.2);Unsteadiness on feet (R26.81);History of falling (Z91.81)     Time: 1345-1400 PT Time Calculation (min) (ACUTE ONLY): 15 min  Charges:  $Gait Training: 8-22 mins                     Brenda [name] PT, DPT Acute Rehabilitation Services Pager: [phone] Office: [phone]  Trena Platt 08/13/2020, 3:34 PM

## 2020-08-13 NOTE — Progress Notes (Signed)
Patient ID: Brenda [name], female   DOB: 07/10/1960, 60 y.o.   MRN: [id number]  PROGRESS NOTE    Maricsa Sammons  LPF:[id number] DOB: [birth year] DOA: 08/09/2020 PCP: Pcp, No   Brief Narrative:  60 year old female with history of stage IV lung cancer status post 12 cycles of pemetrexed and pembrolizumab, depression, breast cancer, alcohol abuse, polysubstance abuse, tobacco abuse recent admission and discharge home (refused SNF placement) from 07/01/2020-08/02/2020 for AKI on CKD stage IV/seizures/pancytopenia presented on 08/09/2020 with altered mental status and confusion along with poor oral intake and nausea with vomiting.  On presentation, potassium was 2.9; creatinine of 3.51.  COVID-19 testing was negative.  Urine drug screen was positive for cocaine.  CT of the head without contrast was negative for acute intracranial abnormality.  Assessment & Plan:   Acute metabolic encephalopathy -Questionable cause.  Presented with confusion and CT of the head was negative for acute intracranial abnormality.  Urine drug screen was positive for cocaine.  Patient is still confused although awake and does not provide any appropriate history.   -MRI of brain was negative for any intracranial acute abnormality. -EEG negative for seizures.  Mental status much more improved today.  Consider outpatient evaluation and follow-up with neurology -Monitor mental status.  Fall precautions -PT recommended SNF.  Social worker consult. -Diet as per SLP recommendation -Ammonia, TSH, folate and vitamin B12 levels normal  Hypokalemia -Possibly from poor oral intake.  Improved.  Hypomagnesemia -Replace.  Repeat a.m. labs  Stage IV lung cancer -Oncology following. -Palliative care evaluated the patient: Patient remains full code.  Polysubstance abuse History of alcohol abuse Tobacco abuse -Unclear when her last intake of cocaine was.  Urine drug screen was positive for cocaine.  Monitor mental status.  Continue  multivitamin and folic acid.  Received thiamine 500 mg daily for 3 days.  Will continue 250 mg daily for 3 more days if she remains inpatient till then.  CKD stage IV -Creatinine at baseline.    -Creatinine slightly worse at 2.80 today.  Monitor  Persistent sinus tachycardia -Questionable cause.  Will use labetalol if remains persistently tachycardic  Anemia of chronic disease  -Probably from lung cancer.  Hemoglobin 8.5 today.  Status post 1 unit packed red cells transfusion on 08/12/2020.  Monitor.  Metabolic acidosis -Questionable cause.  Resolved.  DC'd sodium bicarbonate.  Generalized deconditioning -Will need SNF placement.  DVT prophylaxis: Hold off on Lovenox because of anemia. SCDs. Code Status: Full Family Communication: None at bedside Disposition Plan: Status is: Inpatient  Remains inpatient appropriate because:Inpatient level of care appropriate due to severity of illness   Dispo: The patient is from: Home              Anticipated d/c is to: SNF.                Patient currently is medically stable to d/c.   Difficult to place patient No  Consultants: Palliative care/oncology  Procedures: EEG  Antimicrobials: None  Subjective: Patient seen and examined at bedside.  Poor historian.  No fever, worsening shortness of breath, vomiting reported.  Required Ativan yesterday evening as per nursing staff.   Objective: Vitals:   08/12/20 2256 08/12/20 2332 08/13/20 0159 08/13/20 0447  BP: 119/77 111/70 105/70 106/71  Pulse: (!) 114 (!) 118 (!) 119 (!) 113  Resp: 20 20 20  (!) 21  Temp: 99 F (37.2 C) 98.8 F (37.1 C) 98.9 F (37.2 C) 98.7 F (37.1 C)  TempSrc: Oral Oral Oral  SpO2: 100% 100% 98% 97%  Weight:    52 kg    Intake/Output Summary (Last 24 hours) at 08/13/2020 0759 Last data filed at 08/13/2020 0159 Gross per 24 hour  Intake 650 ml  Output 200 ml  Net 450 ml   Filed Weights   08/13/20 0447  Weight: 52 kg    Examination:  General exam:  On room air currently.  No distress.  Looks chronically ill and deconditioned.  Currently on room air  respiratory system: Bilateral decreased breath sounds at bases with scattered crackles and intermittently tachypneic  cardiovascular system: Tachycardic, S1-S2 heard gastrointestinal system: Abdomen is distended slightly, soft and nontender.  Bowel sounds are heard  extremities: Mild lower extremity edema present; no clubbing  Central nervous system: More awake, answers some questions appropriately, slow to respond but improving compared to yesterday.  No focal neurological deficits.  Moves extremities skin: No obvious ecchymosis/lesions psychiatry: Flat affect  Data Reviewed: I have personally reviewed following labs and imaging studies  CBC: Recent Labs  Lab 08/09/20 1450 08/10/20 0827 08/11/20 0847 08/12/20 0433 08/13/20 0525  WBC 6.3 8.4 7.0 5.0 6.0  NEUTROABS 4.2  --  4.0 2.6 2.5  HGB 7.7* 8.3* 8.2* 7.0* 8.5*  HCT 23.9* 25.0* 25.6* 22.0* 25.8*  MCV 92.3 91.2 90.8 92.8 92.1  PLT 204 267 305 252 277   Basic Metabolic Panel: Recent Labs  Lab 08/09/20 1450 08/10/20 0827 08/11/20 0847 08/12/20 0433 08/13/20 0525  NA 136 142 142 145 137  K 2.9* 3.4* 3.8 3.6 3.7  CL 107 113* 114* 113* 106  CO2 19* 14* 17* 25 24  GLUCOSE 95 93 114* 107* 84  BUN 17 19 22* 27* 30*  CREATININE 2.51* 2.48* 2.52* 2.59* 2.80*  CALCIUM 8.0* 8.3* 8.4* 8.6* 8.3*  MG  --   --  1.5* 1.8 1.6*   GFR: Estimated Creatinine Clearance: 18.2 mL/min (A) (by C-G formula based on SCr of 2.8 mg/dL (H)). Liver Function Tests: Recent Labs  Lab 08/09/20 1450 08/10/20 0827 08/11/20 0847  AST 15 16 16   ALT 12 10 10   ALKPHOS 121 120 121  BILITOT 0.9 1.2 0.3  PROT 6.8 6.7 6.7  ALBUMIN 2.3* 2.0* 2.0*   No results for input(s): LIPASE, AMYLASE in the last 168 hours. Recent Labs  Lab 08/12/20 0433  AMMONIA 16   Coagulation Profile: No results for input(s): INR, PROTIME in the last 168 hours. Cardiac  Enzymes: Recent Labs  Lab 08/09/20 1450  CKTOTAL 19*   BNP (last 3 results) No results for input(s): PROBNP in the last 8760 hours. HbA1C: No results for input(s): HGBA1C in the last 72 hours. CBG: No results for input(s): GLUCAP in the last 168 hours. Lipid Profile: No results for input(s): CHOL, HDL, LDLCALC, TRIG, CHOLHDL, LDLDIRECT in the last 72 hours. Thyroid Function Tests: Recent Labs    08/12/20 0433  TSH 4.419   Anemia Panel: Recent Labs    08/12/20 0433 08/13/20 0525  VITAMINB12 967*  --   FOLATE 16.3  --   FERRITIN  --  1,453*  TIBC  --  146*  IRON  --  50   Sepsis Labs: No results for input(s): PROCALCITON, LATICACIDVEN in the last 168 hours.  Recent Results (from the past 240 hour(s))  Resp Panel by RT-PCR (Flu A&B, Covid) Nasopharyngeal Swab     Status: None   Collection Time: 08/09/20  3:08 PM   Specimen: Nasopharyngeal Swab; Nasopharyngeal(NP) swabs in vial transport medium  Result  Value Ref Range Status   SARS Coronavirus 2 by RT PCR NEGATIVE NEGATIVE Final    Comment: (NOTE) SARS-CoV-2 target nucleic acids are NOT DETECTED.  The SARS-CoV-2 RNA is generally detectable in upper respiratory specimens during the acute phase of infection. The lowest concentration of SARS-CoV-2 viral copies this assay can detect is 138 copies/mL. A negative result does not preclude SARS-Cov-2 infection and should not be used as the sole basis for treatment or other patient management decisions. A negative result may occur with  improper specimen collection/handling, submission of specimen other than nasopharyngeal swab, presence of viral mutation(s) within the areas targeted by this assay, and inadequate number of viral copies(<138 copies/mL). A negative result must be combined with clinical observations, patient history, and epidemiological information. The expected result is Negative.  Fact Sheet for Patients:  EntrepreneurPulse.com.au  Fact  Sheet for Healthcare Providers:  IncredibleEmployment.be  This test is no t yet approved or cleared by the [place] FDA and  has been authorized for detection and/or diagnosis of SARS-CoV-2 by FDA under an Emergency Use Authorization (EUA). This EUA will remain  in effect (meaning this test can be used) for the duration of the COVID-19 declaration under Section 564(b)(1) of the Act, 21 U.S.C.section 360bbb-3(b)(1), unless the authorization is terminated  or revoked sooner.       Influenza A by PCR NEGATIVE NEGATIVE Final   Influenza B by PCR NEGATIVE NEGATIVE Final    Comment: (NOTE) The Xpert Xpress SARS-CoV-2/FLU/RSV plus assay is intended as an aid in the diagnosis of influenza from Nasopharyngeal swab specimens and should not be used as a sole basis for treatment. Nasal washings and aspirates are unacceptable for Xpert Xpress SARS-CoV-2/FLU/RSV testing.  Fact Sheet for Patients: EntrepreneurPulse.com.au  Fact Sheet for Healthcare Providers: IncredibleEmployment.be  This test is not yet approved or cleared by the [place] FDA and has been authorized for detection and/or diagnosis of SARS-CoV-2 by FDA under an Emergency Use Authorization (EUA). This EUA will remain in effect (meaning this test can be used) for the duration of the COVID-19 declaration under Section 564(b)(1) of the Act, 21 U.S.C. section 360bbb-3(b)(1), unless the authorization is terminated or revoked.  Performed at [facility], [place] [address], [place], [place] [address]   Urine Culture     Status: None   Collection Time: 08/09/20  4:28 PM   Specimen: Urine, Random  Result Value Ref Range Status   Specimen Description   Final    URINE, RANDOM Performed at [facility] [address] [place], [place] [address]    Special Requests   Final    NONE Performed at [facility], [place]  [address], [place], [place] [address]    Culture   Final    NO GROWTH Performed at [facility], [place] [address] [place], [place] [address]    Report Status 08/11/2020 FINAL  Final         Radiology Studies: DG Chest 2 View  Result Date: 08/11/2020 CLINICAL DATA:  Shortness of breath EXAM: CHEST - 2 VIEW COMPARISON:  08/09/2020 FINDINGS: Cardiomegaly. Large left pleural effusion. Small right pleural effusion. Bibasilar atelectasis. Right Port-A-Cath remains in place, unchanged. IMPRESSION: Bilateral effusions, left greater than right with bibasilar atelectasis. Cardiomegaly. Electronically Signed   By: Rolm Baptise M.D.   On: 08/11/2020 14:21   EEG adult  Result Date: 08/12/2020 Lora Havens, MD     08/12/2020  3:10 PM Patient Name: Brookelle  Norland MRN: [id number] Epilepsy Attending: Lora Havens Referring Physician/Provider: Dr. Aline August Date: 08/12/2020 Duration: 23.57 mins Patient history: 60 year old female with altered mental status.  EEG to evaluate for seizures. Level of alertness: Awake AEDs during EEG study: None Technical aspects: This EEG study was done with scalp electrodes positioned according to the 10-20 International system of electrode placement. Electrical activity was acquired at a sampling rate of 500Hz  and reviewed with a high frequency filter of 70Hz  and a low frequency filter of 1Hz . EEG data were recorded continuously and digitally stored. Description: The posterior dominant rhythm consists of 9 Hz activity of moderate voltage (25-35 uV) seen predominantly in posterior head regions, symmetric and reactive to eye opening and eye closing. Hyperventilation and photic stimulation were not performed.   IMPRESSION: This study is within normal limits. No seizures or epileptiform discharges were seen throughout the recording. Priyanka [name]        Scheduled Meds: . Chlorhexidine Gluconate Cloth  6 each Topical Daily  . ferrous sulfate  325 mg Oral BID WC   . folic acid  1 mg Oral Daily  . LORazepam  0.5 mg Intravenous Once  . multivitamin with minerals  1 tablet Oral Daily  . nicotine  21 mg Transdermal Daily   Continuous Infusions: . magnesium sulfate bolus IVPB            Aline August, MD [facility] 08/13/2020, 7:59 AM

## 2020-08-13 NOTE — Progress Notes (Signed)
Drug paraphernalia (glass pipe) discovered in pt room overnight. Williams RN removed from the room, pt aware and understands.  Leadership discarded pipe.

## 2020-08-14 ENCOUNTER — Encounter (HOSPITAL_COMMUNITY): Payer: Self-pay | Admitting: Internal Medicine

## 2020-08-14 ENCOUNTER — Inpatient Hospital Stay: Payer: Medicaid Other | Admitting: Physician Assistant

## 2020-08-14 ENCOUNTER — Inpatient Hospital Stay: Payer: Medicaid Other

## 2020-08-14 ENCOUNTER — Ambulatory Visit: Payer: Self-pay

## 2020-08-14 DIAGNOSIS — I5041 Acute combined systolic (congestive) and diastolic (congestive) heart failure: Secondary | ICD-10-CM

## 2020-08-14 LAB — CBC WITH DIFFERENTIAL/PLATELET
Abs Immature Granulocytes: 0.2 10*3/uL — ABNORMAL HIGH (ref 0.00–0.07)
Basophils Absolute: 0 10*3/uL (ref 0.0–0.1)
Basophils Relative: 0 %
Eosinophils Absolute: 0 10*3/uL (ref 0.0–0.5)
Eosinophils Relative: 0 %
HCT: 26 % — ABNORMAL LOW (ref 36.0–46.0)
Hemoglobin: 8.4 g/dL — ABNORMAL LOW (ref 12.0–15.0)
Immature Granulocytes: 4 %
Lymphocytes Relative: 35 %
Lymphs Abs: 2 10*3/uL (ref 0.7–4.0)
MCH: 30.2 pg (ref 26.0–34.0)
MCHC: 32.3 g/dL (ref 30.0–36.0)
MCV: 93.5 fL (ref 80.0–100.0)
Monocytes Absolute: 1.2 10*3/uL — ABNORMAL HIGH (ref 0.1–1.0)
Monocytes Relative: 20 %
Neutro Abs: 2.3 10*3/uL (ref 1.7–7.7)
Neutrophils Relative %: 41 %
Platelets: 253 10*3/uL (ref 150–400)
RBC: 2.78 MIL/uL — ABNORMAL LOW (ref 3.87–5.11)
RDW: 16 % — ABNORMAL HIGH (ref 11.5–15.5)
WBC: 5.7 10*3/uL (ref 4.0–10.5)
nRBC: 0 % (ref 0.0–0.2)

## 2020-08-14 LAB — BASIC METABOLIC PANEL
Anion gap: 7 (ref 5–15)
BUN: 32 mg/dL — ABNORMAL HIGH (ref 6–20)
CO2: 25 mmol/L (ref 22–32)
Calcium: 8.4 mg/dL — ABNORMAL LOW (ref 8.9–10.3)
Chloride: 105 mmol/L (ref 98–111)
Creatinine, Ser: 2.82 mg/dL — ABNORMAL HIGH (ref 0.44–1.00)
GFR, Estimated: 19 mL/min — ABNORMAL LOW (ref >60)
Glucose, Bld: 83 mg/dL (ref 70–99)
Potassium: 4 mmol/L (ref 3.5–5.1)
Sodium: 137 mmol/L (ref 135–145)

## 2020-08-14 LAB — MAGNESIUM: Magnesium: 2.2 mg/dL (ref 1.7–2.4)

## 2020-08-14 NOTE — Consult Note (Addendum)
Cardiology Consultation:   Patient ID: Karlene Southard MRN: [id number]; DOB: 22-Jul-1960  Admit date: 08/09/2020 Date of Consult: 08/14/2020  PCP:  Merryl Hacker, No   [facility] Providers Cardiologist:  New to [place]; Dr. Gardiner Rhyme   Patient Profile:   Shenell Rogalski is a 60 y.o. female with a PMH of stage IV lung cancer s/p chemo, HIV, depression, history of breast cancer, alcohol abuse, tobacco abuse, polysubstance abuse (cocaine), and recent hospitalization for sepsis who is being seen 08/14/2020 for the evaluation of acute combined HF at the request of Dr. Starla Link.  History of Present Illness:   Ms. Yackel presented to [facility] ED with altered mental status.  She was recently admitted from 07/01/2020 to 08/02/2020 after presenting with seizure-like activity.  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  Her hospital course was further complicated by AKI with creatinine 2.0 on discharge as well as suspected small bowel bleeding contributing to her anemia.  She was recommended to go to SNF however refused.  She missed a follow-up appointment with oncology following discharge.  On 08/09/2020 she was found sitting on the floor in her urine, confused prompting EMS activation.  On arrival to the ED she was hypotensive, tachycardic, tachypneic.  Labs notable for potassium 2.9, creatinine 3.51, hemoglobin 7.7, platelets 204, U tox positive for cocaine.  CT head negative.  She was admitted to medicine for further management of her metabolic encephalopathy.  Potassium repleted with improvement.  Oncology consulted and recommended discontinuation of her chemotherapy due to side effects and consideration to continue immunotherapy pending recovery from present illness.  Palliative care was consulted for goals of care discussion and  patient wished to pursue full code at this time no stated she would not want want life prolonged with artificial measures without improvement.  Neurology performed EEG which was negative for seizure activity.  An echocardiogram was obtained to evaluate shortness of breath 08/13/2020 which showed EF 40 to 45%, global hypokinesis, grade 1 diastolic dysfunction, normal RV size and function, large pleural effusion in the left lateral region, and no significant valvular abnormalities.  Cardiology was asked to evaluate.  Patient has no prior cardiac history and has never undergone prior cardiac testing.  She has been noted to have coronary artery calcifications and aortic atherosclerosis on prior PET scan.  She has not had any designated ischemic testing.  At the time of this evaluation she has no specific complaints. She is not very active at baseline. She denies recent trouble with chest pain. She reports breathing has been stable. No complaints of orthopnea, PND, or LE edema. She denies prior cardiac history. She thinks her father had an MI in this 46s, though ultimately passed from a saw accident, and mother passes from cancer, though she does not recall what kind or her age at the time of passing. We discussed the importance of abstaining from alcohol and cocaine use going forward.  We also discussed the limitations of further evaluating her heart failure, as well as management.     Past Medical History:  Diagnosis Date  . Breast cancer ([place])   . Depression   . Mental disorder     Past Surgical History:  Procedure Laterality Date  . ESOPHAGOGASTRODUODENOSCOPY (EGD) WITH PROPOFOL N/A 04/09/2020   Procedure: ESOPHAGOGASTRODUODENOSCOPY (EGD) WITH PROPOFOL;  Surgeon: Doran Stabler, MD;  Location: [facility]  ENDOSCOPY;  Service: Gastroenterology;  Laterality: N/A;  . IR IMAGING GUIDED PORT INSERTION  11/03/2019  . TUBAL LIGATION    . VIDEO BRONCHOSCOPY WITH ENDOBRONCHIAL NAVIGATION N/A 04/04/2019    Procedure: VIDEO BRONCHOSCOPY WITH ENDOBRONCHIAL NAVIGATION;  Surgeon: Garner Nash, DO;  Location: [facility];  Service: Thoracic;  Laterality: N/A;  . VIDEO BRONCHOSCOPY WITH ENDOBRONCHIAL ULTRASOUND N/A 04/04/2019   Procedure: VIDEO BRONCHOSCOPY WITH ENDOBRONCHIAL ULTRASOUND;  Surgeon: Garner Nash, DO;  Location: [facility] OR;  Service: Thoracic;  Laterality: N/A;     Home Medications:  Prior to Admission medications   Medication Sig Start Date End Date Taking? Authorizing Provider  acetaminophen (TYLENOL) 325 MG tablet Take 2 tablets (650 mg total) by mouth every 6 (six) hours as needed for mild pain, moderate pain or headache. 04/11/20   Domenic Polite, MD  ferrous sulfate 325 (65 FE) MG tablet TAKE 1 TABLET (325 MG TOTAL) BY MOUTH 2 (TWO) TIMES DAILY WITH A MEAL. Patient taking differently: Take 325 mg by mouth 2 (two) times daily with a meal. 04/11/20 04/11/21  Domenic Polite, MD  folic acid (FOLVITE) 1 MG tablet TAKE 1 TABLET (1 MG TOTAL) BY MOUTH DAILY. Patient taking differently: Take 1 mg by mouth daily. 04/11/20 04/11/21  Domenic Polite, MD  gabapentin (NEURONTIN) 300 MG capsule Take 1 capsule (300 mg total) by mouth at bedtime. 06/11/20   Tanner, Lyndon Code., PA-C  lidocaine-prilocaine (EMLA) cream Apply 1 application topically as needed. Patient taking differently: Apply 1 application topically as needed (port access). 10/19/19   Heilingoetter, Cassandra L, PA-C  pantoprazole (PROTONIX) 40 MG tablet TAKE 1 TABLET (40 MG TOTAL) BY MOUTH 2 (TWO) TIMES DAILY. Patient taking differently: Take 40 mg by mouth 2 (two) times daily. 04/11/20 04/11/21  Domenic Polite, MD  polyethylene glycol (MIRALAX / GLYCOLAX) 17 g packet Dissolve 1 packet (17 g) in liquid and drink 2 (two) times daily. 07/26/20   Arrien, Jimmy Picket, MD  prochlorperazine (COMPAZINE) 10 MG tablet Take 1 tablet (10 mg total) by mouth every 6 (six) hours as needed for nausea or vomiting. Patient not taking: Reported on 04/08/2020 01/17/20  04/11/20  Heilingoetter, Cassandra L, PA-C    Inpatient Medications: Scheduled Meds: . Chlorhexidine Gluconate Cloth  6 each Topical Daily  . ferrous sulfate  325 mg Oral BID WC  . folic acid  1 mg Oral Daily  . LORazepam  0.5 mg Intravenous Once  . multivitamin with minerals  1 tablet Oral Daily  . nicotine  21 mg Transdermal Daily   Continuous Infusions: . thiamine injection 250 mg (08/14/20 1054)   PRN Meds: labetalol, LORazepam, ondansetron **OR** ondansetron (ZOFRAN) IV, sodium chloride flush, traMADol  Allergies:    Allergies  Allergen Reactions  . Aspirin Adult Low [Aspirin] Other (See Comments)    Stomach upset    Social History:   Social History   Socioeconomic History  . Marital status: Legally Separated    Spouse name: Not on file  . Number of children: Not on file  . Years of education: Not on file  . Highest education level: Not on file  Occupational History  . Not on file  Tobacco Use  . Smoking status: Former Smoker    Packs/day: 0.50    Years: 30.00    Pack years: 15.00    Types: Cigarettes    Quit date: 05/27/2019    Years since quitting: 1.2  . Smokeless tobacco: Never Used  Vaping Use  . Vaping Use: Never used  Substance and Sexual Activity  . Alcohol use: Yes    Alcohol/week: 84.0 standard drinks    Types: 84 Cans of beer per week  . Drug use: Yes    Types: Cocaine  . Sexual activity: Not on file  Other Topics Concern  . Not on file  Social History Narrative   ** Merged History Encounter **       Social Determinants of Health   Financial Resource Strain: Not on file  Food Insecurity: Not on file  Transportation Needs: Not on file  Physical Activity: Not on file  Stress: Not on file  Social Connections: Not on file  Intimate Partner Violence: Not on file    Family History:   Family History  Problem Relation Age of Onset  . Cancer Mother   . Heart attack Father        thinks he had MI in his 47s; died from saw accident       ROS:  Please see the history of present illness.   All other ROS reviewed and negative.     Physical Exam/Data:   Vitals:   08/13/20 1647 08/13/20 2041 08/14/20 0426 08/14/20 0431  BP: 104/70 108/75 103/70   Pulse: (!) 112 (!) 106 (!) 102   Resp: 20 19 18    Temp: 98.9 F (37.2 C) 98.6 F (37 C) 98.4 F (36.9 C)   TempSrc:      SpO2: 99% 99% 98%   Weight:    55.8 kg    Intake/Output Summary (Last 24 hours) at 08/14/2020 1120 Last data filed at 08/14/2020 0174 Gross per 24 hour  Intake 2112 ml  Output 850 ml  Net 1262 ml   Last 3 Weights 08/14/2020 08/13/2020 07/31/2020  Weight (lbs) 123 lb 0.3 oz 114 lb 10.2 oz 119 lb 1.6 oz  Weight (kg) 55.8 kg 52 kg 54.023 kg  Some encounter information is confidential and restricted. Go to Review Flowsheets activity to see all data.     Body mass index is 19.86 kg/m.  General: Chronically ill-appearing female laying in bed in NAD HEENT: sclera anicteric Neck: no JVD Vascular: No carotid bruits; distal pulses 2+ bilaterally without bruits  Cardiac:  normal S1, S2; tachycardic, regular rhythm; no murmurs, rubs, or gallops Lungs:  Decreased breath sounds at lung bases without faint crackles  Abd: soft, nontender, no hepatomegaly  Ext: no edema Musculoskeletal:  No deformities, BUE and BLE strength normal and equal Skin: warm and dry  Neuro:  CNs 2-12 intact, no focal abnormalities noted Psych:  Normal affect   EKG:  The EKG was personally reviewed and demonstrates:  Sinus tachycardia, rate 104 bpm, nearly diffuse T wave inversions, no significant STE/D; TWI new compared to 06/2020. Telemetry:  Telemetry was personally reviewed and demonstrates: Sinus tachycardia with rates in the 100s to 120s  Relevant CV Studies: Echocardiogram 08/13/20: 1. Left ventricular ejection fraction, by estimation, is 40 to 45%. The  left ventricle has mildly decreased function. The left ventricle  demonstrates global hypokinesis. Left ventricular  diastolic parameters are  consistent with Grade I diastolic  dysfunction (impaired relaxation).  2. Right ventricular systolic function is normal. The right ventricular  size is normal.  3. Large pleural effusion in the left lateral region.  4. The mitral valve is normal in structure. No evidence of mitral valve  regurgitation. No evidence of mitral stenosis.  5. The aortic valve is normal in structure. Aortic valve regurgitation is  not visualized. No aortic stenosis is  present.  6. The inferior vena cava is normal in size with greater than 50%  respiratory variability, suggesting right atrial pressure of 3 mmHg.   Laboratory Data:  High Sensitivity Troponin:  No results for input(s): TROPONINIHS in the last 720 hours.   Chemistry Recent Labs  Lab 08/12/20 0433 08/13/20 0525 08/14/20 0340  NA 145 137 137  K 3.6 3.7 4.0  CL 113* 106 105  CO2 25 24 25   GLUCOSE 107* 84 83  BUN 27* 30* 32*  CREATININE 2.59* 2.80* 2.82*  CALCIUM 8.6* 8.3* 8.4*  GFRNONAA 21* 19* 19*  ANIONGAP 7 7 7     Recent Labs  Lab 08/09/20 1450 08/10/20 0827 08/11/20 0847  PROT 6.8 6.7 6.7  ALBUMIN 2.3* 2.0* 2.0*  AST 15 16 16   ALT 12 10 10   ALKPHOS 121 120 121  BILITOT 0.9 1.2 0.3   Hematology Recent Labs  Lab 08/12/20 0433 08/13/20 0525 08/14/20 0340  WBC 5.0 6.0 5.7  RBC 2.37* 2.80* 2.78*  HGB 7.0* 8.5* 8.4*  HCT 22.0* 25.8* 26.0*  MCV 92.8 92.1 93.5  MCH 29.5 30.4 30.2  MCHC 31.8 32.9 32.3  RDW 16.8* 15.5 16.0*  PLT 252 238 253   BNPNo results for input(s): BNP, PROBNP in the last 168 hours.  DDimer No results for input(s): DDIMER in the last 168 hours.   Radiology/Studies:  DG Chest 2 View  Result Date: 08/11/2020 CLINICAL DATA:  Shortness of breath EXAM: CHEST - 2 VIEW COMPARISON:  08/09/2020 FINDINGS: Cardiomegaly. Large left pleural effusion. Small right pleural effusion. Bibasilar atelectasis. Right Port-A-Cath remains in place, unchanged. IMPRESSION: Bilateral  effusions, left greater than right with bibasilar atelectasis. Cardiomegaly. Electronically Signed   By: Rolm Baptise M.D.   On: 08/11/2020 14:21   MR BRAIN WO CONTRAST  Result Date: 08/10/2020 CLINICAL DATA:  Mental status change.  Mental status change EXAM: MRI HEAD WITHOUT CONTRAST TECHNIQUE: Multiplanar, multiecho pulse sequences of the brain and surrounding structures were obtained without intravenous contrast. COMPARISON:  MRI head 04/08/2020. CT head 08/09/2020 CT head 08/09/2020 FINDINGS: Brain: Image quality degraded by motion. Rapid scanning performed due to motion. Moderate atrophy. Negative for hydrocephalus. 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 Minimal white matter changes in the cerebral hemispheres. Negative for acute infarct, hemorrhage, mass. Vascular: Normal arterial flow voids at the skull base. Skull and upper cervical spine: No focal lesion. Sinuses/Orbits: Mucosal edema paranasal sinuses. Large retention cyst left maxillary sinus. Negative orbit Other: None IMPRESSION: Negative for acute abnormality Atrophy and chronic ischemic change most notably in the central pons. Electronically Signed   By: Franchot Gallo M.D.   On: 08/10/2020 13:13   DG Abd 2 Views  Result Date: 08/13/2020 CLINICAL DATA:  Altered mental status. Abdominal distension, weakness and shortness of breath. EXAM: ABDOMEN - 2 VIEW COMPARISON:  CT chest, abdomen and pelvis 05/21/2020. FINDINGS: There is moderate gaseous distention of stomach. No small bowel distention is identified. Gas and stool are seen scattered through the colon. Small gallstones are noted. No free intraperitoneal air. Left pleural effusion and basilar airspace disease are seen. IMPRESSION: Moderate gaseous distention of the stomach. Negative for free intraperitoneal air or bowel obstruction. Left pleural effusion and basilar airspace disease. Gallstones as seen on prior CT. Electronically Signed   By: Inge Rise M.D.   On:  08/13/2020 14:27   EEG adult  Result Date: 08/12/2020 Lora Havens, MD     08/12/2020  3:10 PM Patient Name: Cree Napoli  MRN: [id number] Epilepsy Attending: Lora Havens Referring Physician/Provider: Dr. Aline August Date: 08/12/2020 Duration: 23.57 mins Patient history: 60 year old female with altered mental status.  EEG to evaluate for seizures. Level of alertness: Awake AEDs during EEG study: None Technical aspects: This EEG study was done with scalp electrodes positioned according to the 10-20 International system of electrode placement. Electrical activity was acquired at a sampling rate of 500Hz  and reviewed with a high frequency filter of 70Hz  and a low frequency filter of 1Hz . EEG data were recorded continuously and digitally stored. Description: The posterior dominant rhythm consists of 9 Hz activity of moderate voltage (25-35 uV) seen predominantly in posterior head regions, symmetric and reactive to eye opening and eye closing. Hyperventilation and photic stimulation were not performed.   IMPRESSION: This study is within normal limits. No seizures or epileptiform discharges were seen throughout the recording. Lora Havens   ECHOCARDIOGRAM COMPLETE  Result Date: 08/13/2020    ECHOCARDIOGRAM REPORT   Patient Name:   AMRA SHUKLA Date of Exam: 08/13/2020 Medical Rec #:  [id number]      Height:       66.0 in Accession #:    [id number]     Weight:       114.6 lb Date of Birth:  01-13-1961      BSA:          1.579 m Patient Age:    38 years       BP:           103/72 mmHg Patient Gender: F              HR:           106 bpm. Exam Location:  Inpatient Procedure: 2D Echo, Cardiac Doppler and Color Doppler Indications:    Dyspnea  History:        Patient has no prior history of Echocardiogram examinations.  Sonographer:    Luisa Hart RDCS Referring Phys: [id number] [facility]  Sonographer Comments: Patient is morbidly obese. Image acquisition challenging due to patient body habitus.  IMPRESSIONS  1. Left ventricular ejection fraction, by estimation, is 40 to 45%. The left ventricle has mildly decreased function. The left ventricle demonstrates global hypokinesis. Left ventricular diastolic parameters are consistent with Grade I diastolic dysfunction (impaired relaxation).  2. Right ventricular systolic function is normal. The right ventricular size is normal.  3. Large pleural effusion in the left lateral region.  4. The mitral valve is normal in structure. No evidence of mitral valve regurgitation. No evidence of mitral stenosis.  5. The aortic valve is normal in structure. Aortic valve regurgitation is not visualized. No aortic stenosis is present.  6. The inferior vena cava is normal in size with greater than 50% respiratory variability, suggesting right atrial pressure of 3 mmHg. FINDINGS  Left Ventricle: Left ventricular ejection fraction, by estimation, is 40 to 45%. The left ventricle has mildly decreased function. The left ventricle demonstrates global hypokinesis. The left ventricular internal cavity size was normal in size. There is  no left ventricular hypertrophy. Left ventricular diastolic parameters are consistent with Grade I diastolic dysfunction (impaired relaxation). Right Ventricle: The right ventricular size is normal. No increase in right ventricular wall thickness. Right ventricular systolic function is normal. Left Atrium: Left atrial size was normal in size. Right Atrium: Right atrial size was normal in size. Pericardium: There is no evidence of pericardial effusion. Mitral Valve: The mitral valve is normal in structure. No evidence of mitral valve  regurgitation. No evidence of mitral valve stenosis. MV peak gradient, 5.4 mmHg. The mean mitral valve gradient is 2.0 mmHg. Tricuspid Valve: The tricuspid valve is normal in structure. Tricuspid valve regurgitation is not demonstrated. No evidence of tricuspid stenosis. Aortic Valve: The aortic valve is normal in structure.  Aortic valve regurgitation is not visualized. No aortic stenosis is present. Aortic valve mean gradient measures 3.0 mmHg. Aortic valve peak gradient measures 5.3 mmHg. Aortic valve area, by VTI measures 2.77 cm. Pulmonic Valve: The pulmonic valve was normal in structure. Pulmonic valve regurgitation is not visualized. No evidence of pulmonic stenosis. Aorta: The aortic root is normal in size and structure. Venous: The inferior vena cava is normal in size with greater than 50% respiratory variability, suggesting right atrial pressure of 3 mmHg. IAS/Shunts: No atrial level shunt detected by color flow Doppler. Additional Comments: There is a large pleural effusion in the left lateral region.  LEFT VENTRICLE PLAX 2D LVIDd:         4.80 cm     Diastology LVIDs:         3.60 cm     LV e' medial:    5.44 cm/s LV PW:         0.80 cm     LV E/e' medial:  10.1 LV IVS:        0.90 cm     LV e' lateral:   5.33 cm/s LVOT diam:     2.20 cm     LV E/e' lateral: 10.3 LV SV:         49 LV SV Index:   31 LVOT Area:     3.80 cm  LV Volumes (MOD) LV vol d, MOD A2C: 49.4 ml LV vol d, MOD A4C: 52.0 ml LV vol s, MOD A2C: 35.6 ml LV vol s, MOD A4C: 31.6 ml LV SV MOD A2C:     13.8 ml LV SV MOD A4C:     52.0 ml LV SV MOD BP:      17.5 ml RIGHT VENTRICLE RV Basal diam:  3.20 cm RV Mid diam:    2.50 cm RV S prime:     10.00 cm/s TAPSE (M-mode): 1.8 cm LEFT ATRIUM             Index LA Vol (A2C):   51.3 ml 32.50 ml/m LA Vol (A4C):   34.1 ml 21.60 ml/m LA Biplane Vol: 42.6 ml 26.99 ml/m  AORTIC VALVE                   PULMONIC VALVE AV Area (Vmax):    2.96 cm    PV Vmax:       0.77 m/s AV Area (Vmean):   2.79 cm    PV Vmean:      54.300 cm/s AV Area (VTI):     2.77 cm    PV VTI:        0.135 m AV Vmax:           115.00 cm/s PV Peak grad:  2.4 mmHg AV Vmean:          79.100 cm/s PV Mean grad:  1.0 mmHg AV VTI:            0.177 m AV Peak Grad:      5.3 mmHg AV Mean Grad:      3.0 mmHg LVOT Vmax:         89.40 cm/s LVOT Vmean:  58.100 cm/s LVOT VTI:          0.129 m LVOT/AV VTI ratio: 0.73  AORTA Ao Root diam: 3.30 cm Ao Asc diam:  3.00 cm MITRAL VALVE MV Area (PHT): 5.23 cm     SHUNTS MV Area VTI:   2.07 cm     Systemic VTI:  0.13 m MV Peak grad:  5.4 mmHg     Systemic Diam: 2.20 cm MV Mean grad:  2.0 mmHg MV Vmax:       1.16 m/s MV Vmean:      69.1 cm/s MV Decel Time: 145 msec MV E velocity: 54.70 cm/s MV A velocity: 101.00 cm/s MV E/A ratio:  0.54 Candee Furbish MD Electronically signed by Candee Furbish MD Signature Date/Time: 08/13/2020/4:09:06 PM    Final      Assessment and Plan:   1. Acute combined CHF: found on echo this admission to have EF 40-45%. Unclear acuity of this as she has not had prior imaging. She received a couple dose of IV lasix earlier this admission. UOP is net + 2.8 L this admission and weight, if accurate, is up to 123lbs today from 114lbs yesterday. Echo yesterday suggested a large pleural effusion in the left lateral region - also seen on AXR yesterday. GDMT limited by soft blood pressures, CKD, cocaine use, and underlying pulmonary disease. Etiology remains unclear though differential includes ischemic etiology, chemotherapy induced, or substance abuse (ETOH/cocaine). She is not a candidate for ischemic testing with her anemia requiring transfusions and CKD.  - Could consider a thoracentesis for management of her pleural effusion   2. Polysubstance abuse: recently reports quitting tobacco use but continues to drink alcohol and use cocaine. Abstinence strongly encouraged as these could be contributing to #1.  - Continue to encourage cessation  Remainder of care per primary team: - Stage IV lung cancer - Metabolic encephalopathy - HIV - CKD stage IV - Failure to thrive     Risk Assessment/Risk Scores:     New York Heart Association (NYHA) Functional Class NYHA Class II        For questions or updates, please contact [facility] Please consult [email or web address] for contact info under    Signed, Abigail Butts, PA-C  08/14/2020 11:20 AM  Patient seen and examined.  Agree with above documentation.  Ms Betters is a 60 year old female with a history of stage IV lung cancer, HIV, breast cancer, polysubstance abuse (including alcohol and cocaine) who we are consulted by Dr. Starla Link for evaluation of heart failure.  She recently had a prolonged admission from 4/11 through 08/02/2020.  She had presented with seizure-like activity, found to have staph bacteremia, along with anemia/thrombocytopenia requiring multiple transfusions.  Thought to have ITP and started on IV steroids.  Course was also complicated by AKI, with creatinine 2.0 on admission.  Plan was to discharge to SNF but patient declined.  She was discharged on 5/13.  On 5/20 she was found down at home, and was taken to the ED.  She was hypotensive on presentation to the ED.  Labs notable for creatinine 3.5, potassium 2.9 and UDS positive for cocaine.  She was admitted to medicine.  Palliative care was consulted and patient expressed that would wish to remain full code.  She reported shortness of breath and an echo was done on 08/13/2020 which showed LVEF 40 to 45%, global hypokinesis, normal RV function, no significant valvular disease.  She currently denies any chest pain or shortness of breath.  EKG shows  sinus tachycardia, rate 104, diffuse T wave inversions.  Chest x-ray notable for large left pleural effusion.  On exam, patient is alert and oriented, regular rate and rhythm, no murmurs, diminished breath sounds at left base, no LE edema or JVD.  For her systolic dysfunction (EF 40 to 45%), she does not appear volume overloaded and appears asymptomatic.  She is not a candidate for aggressive care given stage IV lung cancer, anemia, chronic kidney disease.  Ideally would start GDMT, but not candidate for ACE/ARB/Arni given kidney function and will need to be cautious with beta-blocker given soft BP, pulmonary issues, and ongoing  cocaine use.  Would hold off on medical therapy at this time.  She does have a large left pleural effusion, would consider thoracentesis.  Donato Heinz, MD

## 2020-08-14 NOTE — Progress Notes (Signed)
Patient ID: Brenda [name], female   DOB: 05-19-1960, 60 y.o.   MRN: [id number]  PROGRESS NOTE    Kalicia Dufresne  SFK:[id number] DOB: 12/29/1960 DOA: 08/09/2020 PCP: Pcp, No   Brief Narrative:  60 year old female with history of stage IV lung cancer status post 12 cycles of pemetrexed and pembrolizumab, depression, breast cancer, alcohol abuse, polysubstance abuse, tobacco abuse recent admission and discharge home (refused SNF placement) from 07/01/2020-08/02/2020 for AKI on CKD stage IV/seizures/pancytopenia presented on 08/09/2020 with altered mental status and confusion along with poor oral intake and nausea with vomiting.  On presentation, potassium was 2.9; creatinine of 3.51.  COVID-19 testing was negative.  Urine drug screen was positive for cocaine.  CT of the head without contrast was negative for acute intracranial abnormality.  Assessment & Plan:   Acute metabolic encephalopathy -Questionable cause.  Presented with confusion and CT of the head was negative for acute intracranial abnormality.  Urine drug screen was positive for cocaine.   -MRI of brain was negative for any intracranial acute abnormality. -EEG negative for seizures.  Mental status improving over the last few days.  Consider outpatient evaluation and follow-up with neurology -Monitor mental status.  Fall precautions -PT recommended SNF.  Social worker consult. -Diet as per SLP recommendation -Ammonia, TSH, folate and vitamin B12 levels normal  Hypokalemia -Possibly from poor oral intake.  Improved.  Hypomagnesemia -Improved.  Stage IV lung cancer -Oncology following. -Palliative care evaluated the patient: Patient remains full code.  Polysubstance abuse History of alcohol abuse Tobacco abuse -Unclear when her last intake of cocaine was.  Urine drug screen was positive for cocaine.  Monitor mental status.  Continue multivitamin and folic acid.  Received thiamine 500 mg daily for 3 days.  Continue 250 mg daily for 3  total of 3 days and switch to 100 mg daily.  CKD stage IV -Creatinine at baseline.    -Creatinine slightly worse at 2.82 today.  Monitor  Persistent sinus tachycardia -Questionable cause.  Will use labetalol if remains persistently tachycardic.  Echo shows EF of 40 to 45% with grade 1 diastolic dysfunction  New diagnosis of combined heart failure -Echo as above.  Unclear if this is acute or acute on chronic or chronic. -Strict input output.  Daily weights.  Fluid restriction.  Cardiology eval.  Anemia of chronic disease  -Probably from lung cancer.  Hemoglobin 8.4 today.  Status post 1 unit packed red cells transfusion on 08/12/2020.  Monitor.  Metabolic acidosis -Questionable cause.  Resolved.  DC'd sodium bicarbonate.  Generalized deconditioning -Will need SNF placement.  Social worker following  DVT prophylaxis: SCDs.  Lovenox on hold because of anemia. Code Status: Full Family Communication: None at bedside Disposition Plan: Status is: Inpatient  Remains inpatient appropriate because:Inpatient level of care appropriate due to severity of illness   Dispo: The patient is from: Home              Anticipated d/c is to: SNF.                Patient currently is medically stable to d/c.   Difficult to place patient No  Consultants: Palliative care/oncology  Procedures: EEG  Antimicrobials: None  Subjective: Patient seen and examined at bedside.  Poor historian.  No worsening shortness of breath, chest pain or fever reported.  No agitation overnight reported by nursing staff.   Objective: Vitals:   08/13/20 1647 08/13/20 2041 08/14/20 0426 08/14/20 0431  BP: 104/70 108/75 103/70   Pulse: (!) 112 Marland Kitchen)  106 (!) 102   Resp: 20 19 18    Temp: 98.9 F (37.2 C) 98.6 F (37 C) 98.4 F (36.9 C)   TempSrc:      SpO2: 99% 99% 98%   Weight:    55.8 kg    Intake/Output Summary (Last 24 hours) at 08/14/2020 0751 Last data filed at 08/14/2020 0427 Gross per 24 hour  Intake 1522  ml  Output 850 ml  Net 672 ml   Filed Weights   08/13/20 0447 08/14/20 0431  Weight: 52 kg 55.8 kg    Examination:  General exam: No acute distress.  Currently on room air.  Looks chronically ill and deconditioned.   respiratory system: Decreased breath sounds at bases with basilar crackles  cardiovascular system: S1-S2 heard, tachycardic gastrointestinal system: Abdomen is mildly distended, soft and nontender.  Normal bowel sounds heard  extremities: No cyanosis; trace lower extremity edema present Central nervous system: Still slow to respond to questions but answers some questions appropriately.  No focal neurological deficits.  Moving extremities skin: No obvious petechiae/rashes psychiatry: Affect is flat  Data Reviewed: I have personally reviewed following labs and imaging studies  CBC: Recent Labs  Lab 08/09/20 1450 08/10/20 0827 08/11/20 0847 08/12/20 0433 08/13/20 0525 08/14/20 0340  WBC 6.3 8.4 7.0 5.0 6.0 5.7  NEUTROABS 4.2  --  4.0 2.6 2.5 2.3  HGB 7.7* 8.3* 8.2* 7.0* 8.5* 8.4*  HCT 23.9* 25.0* 25.6* 22.0* 25.8* 26.0*  MCV 92.3 91.2 90.8 92.8 92.1 93.5  PLT 204 267 305 252 238 737   Basic Metabolic Panel: Recent Labs  Lab 08/10/20 0827 08/11/20 0847 08/12/20 0433 08/13/20 0525 08/14/20 0340  NA 142 142 145 137 137  K 3.4* 3.8 3.6 3.7 4.0  CL 113* 114* 113* 106 105  CO2 14* 17* 25 24 25   GLUCOSE 93 114* 107* 84 83  BUN 19 22* 27* 30* 32*  CREATININE 2.48* 2.52* 2.59* 2.80* 2.82*  CALCIUM 8.3* 8.4* 8.6* 8.3* 8.4*  MG  --  1.5* 1.8 1.6* 2.2   GFR: Estimated Creatinine Clearance: 19.4 mL/min (A) (by C-G formula based on SCr of 2.82 mg/dL (H)). Liver Function Tests: Recent Labs  Lab 08/09/20 1450 08/10/20 0827 08/11/20 0847  AST 15 16 16   ALT 12 10 10   ALKPHOS 121 120 121  BILITOT 0.9 1.2 0.3  PROT 6.8 6.7 6.7  ALBUMIN 2.3* 2.0* 2.0*   No results for input(s): LIPASE, AMYLASE in the last 168 hours. Recent Labs  Lab 08/12/20 0433   AMMONIA 16   Coagulation Profile: No results for input(s): INR, PROTIME in the last 168 hours. Cardiac Enzymes: Recent Labs  Lab 08/09/20 1450  CKTOTAL 19*   BNP (last 3 results) No results for input(s): PROBNP in the last 8760 hours. HbA1C: No results for input(s): HGBA1C in the last 72 hours. CBG: No results for input(s): GLUCAP in the last 168 hours. Lipid Profile: No results for input(s): CHOL, HDL, LDLCALC, TRIG, CHOLHDL, LDLDIRECT in the last 72 hours. Thyroid Function Tests: Recent Labs    08/12/20 0433  TSH 4.419   Anemia Panel: Recent Labs    08/12/20 0433 08/13/20 0525  VITAMINB12 967*  --   FOLATE 16.3  --   FERRITIN  --  1,453*  TIBC  --  146*  IRON  --  50   Sepsis Labs: No results for input(s): PROCALCITON, LATICACIDVEN in the last 168 hours.  Recent Results (from the past 240 hour(s))  Resp Panel by RT-PCR (  Flu A&B, Covid) Nasopharyngeal Swab     Status: None   Collection Time: 08/09/20  3:08 PM   Specimen: Nasopharyngeal Swab; Nasopharyngeal(NP) swabs in vial transport medium  Result Value Ref Range Status   SARS Coronavirus 2 by RT PCR NEGATIVE NEGATIVE Final    Comment: (NOTE) SARS-CoV-2 target nucleic acids are NOT DETECTED.  The SARS-CoV-2 RNA is generally detectable in upper respiratory specimens during the acute phase of infection. The lowest concentration of SARS-CoV-2 viral copies this assay can detect is 138 copies/mL. A negative result does not preclude SARS-Cov-2 infection and should not be used as the sole basis for treatment or other patient management decisions. A negative result may occur with  improper specimen collection/handling, submission of specimen other than nasopharyngeal swab, presence of viral mutation(s) within the areas targeted by this assay, and inadequate number of viral copies(<138 copies/mL). A negative result must be combined with clinical observations, patient history, and epidemiological information. The  expected result is Negative.  Fact Sheet for Patients:  EntrepreneurPulse.com.au  Fact Sheet for Healthcare Providers:  IncredibleEmployment.be  This test is no t yet approved or cleared by the [place] FDA and  has been authorized for detection and/or diagnosis of SARS-CoV-2 by FDA under an Emergency Use Authorization (EUA). This EUA will remain  in effect (meaning this test can be used) for the duration of the COVID-19 declaration under Section 564(b)(1) of the Act, 21 U.S.C.section 360bbb-3(b)(1), unless the authorization is terminated  or revoked sooner.       Influenza A by PCR NEGATIVE NEGATIVE Final   Influenza B by PCR NEGATIVE NEGATIVE Final    Comment: (NOTE) The Xpert Xpress SARS-CoV-2/FLU/RSV plus assay is intended as an aid in the diagnosis of influenza from Nasopharyngeal swab specimens and should not be used as a sole basis for treatment. Nasal washings and aspirates are unacceptable for Xpert Xpress SARS-CoV-2/FLU/RSV testing.  Fact Sheet for Patients: EntrepreneurPulse.com.au  Fact Sheet for Healthcare Providers: IncredibleEmployment.be  This test is not yet approved or cleared by the [place] FDA and has been authorized for detection and/or diagnosis of SARS-CoV-2 by FDA under an Emergency Use Authorization (EUA). This EUA will remain in effect (meaning this test can be used) for the duration of the COVID-19 declaration under Section 564(b)(1) of the Act, 21 U.S.C. section 360bbb-3(b)(1), unless the authorization is terminated or revoked.  Performed at [facility], [place] [address] [place], [place] [address]   Urine Culture     Status: None   Collection Time: 08/09/20  4:28 PM   Specimen: Urine, Random  Result Value Ref Range Status   Specimen Description   Final    URINE, RANDOM Performed at [place] [address]  [place], [place] [address]    Special Requests   Final    NONE Performed at [facility], [place] [address] [place], [place] [address]    Culture   Final    NO GROWTH Performed at [facility], [place] [address] [place],  [address]    Report Status 08/11/2020 FINAL  Final         Radiology Studies: DG Abd 2 Views  Result Date: 08/13/2020 CLINICAL DATA:  Altered mental status. Abdominal distension, weakness and shortness of breath. EXAM: ABDOMEN - 2 VIEW COMPARISON:  CT chest, abdomen and pelvis 05/21/2020. FINDINGS: There is moderate gaseous distention of stomach. No small bowel distention is identified. Gas and stool are seen scattered through the  colon. Small gallstones are noted. No free intraperitoneal air. Left pleural effusion and basilar airspace disease are seen. IMPRESSION: Moderate gaseous distention of the stomach. Negative for free intraperitoneal air or bowel obstruction. Left pleural effusion and basilar airspace disease. Gallstones as seen on prior CT. Electronically Signed   By: Inge Rise M.D.   On: 08/13/2020 14:27   EEG adult  Result Date: 08/12/2020 Lora Havens, MD     08/12/2020  3:10 PM Patient Name: Callista Hoh MRN: [id number] Epilepsy Attending: Lora Havens Referring Physician/Provider: Dr. Aline August Date: 08/12/2020 Duration: 23.57 mins Patient history: 60 year old female with altered mental status.  EEG to evaluate for seizures. Level of alertness: Awake AEDs during EEG study: None Technical aspects: This EEG study was done with scalp electrodes positioned according to the 10-20 International system of electrode placement. Electrical activity was acquired at a sampling rate of 500Hz  and reviewed with a high frequency filter of 70Hz  and a low frequency filter of 1Hz . EEG data were recorded continuously and digitally stored. Description: The posterior dominant rhythm consists of 9 Hz activity of moderate voltage (25-35 uV)  seen predominantly in posterior head regions, symmetric and reactive to eye opening and eye closing. Hyperventilation and photic stimulation were not performed.   IMPRESSION: This study is within normal limits. No seizures or epileptiform discharges were seen throughout the recording. Lora Havens   ECHOCARDIOGRAM COMPLETE  Result Date: 08/13/2020    ECHOCARDIOGRAM REPORT   Patient Name:   ZAKARA PARKEY Date of Exam: 08/13/2020 Medical Rec #:  [id number]      Height:       66.0 in Accession #:    [id number]     Weight:       114.6 lb Date of Birth:  Jun 22, 1960      BSA:          1.579 m Patient Age:    47 years       BP:           103/72 mmHg Patient Gender: F              HR:           106 bpm. Exam Location:  Inpatient Procedure: 2D Echo, Cardiac Doppler and Color Doppler Indications:    Dyspnea  History:        Patient has no prior history of Echocardiogram examinations.  Sonographer:    Luisa Hart RDCS Referring Phys: [id number] [facility]  Sonographer Comments: Patient is morbidly obese. Image acquisition challenging due to patient body habitus. IMPRESSIONS  1. Left ventricular ejection fraction, by estimation, is 40 to 45%. The left ventricle has mildly decreased function. The left ventricle demonstrates global hypokinesis. Left ventricular diastolic parameters are consistent with Grade I diastolic dysfunction (impaired relaxation).  2. Right ventricular systolic function is normal. The right ventricular size is normal.  3. Large pleural effusion in the left lateral region.  4. The mitral valve is normal in structure. No evidence of mitral valve regurgitation. No evidence of mitral stenosis.  5. The aortic valve is normal in structure. Aortic valve regurgitation is not visualized. No aortic stenosis is present.  6. The inferior vena cava is normal in size with greater than 50% respiratory variability, suggesting right atrial pressure of 3 mmHg. FINDINGS  Left Ventricle: Left ventricular ejection  fraction, by estimation, is 40 to 45%. The left ventricle has mildly decreased function. The left ventricle demonstrates global hypokinesis. The left ventricular internal cavity size  was normal in size. There is  no left ventricular hypertrophy. Left ventricular diastolic parameters are consistent with Grade I diastolic dysfunction (impaired relaxation). Right Ventricle: The right ventricular size is normal. No increase in right ventricular wall thickness. Right ventricular systolic function is normal. Left Atrium: Left atrial size was normal in size. Right Atrium: Right atrial size was normal in size. Pericardium: There is no evidence of pericardial effusion. Mitral Valve: The mitral valve is normal in structure. No evidence of mitral valve regurgitation. No evidence of mitral valve stenosis. MV peak gradient, 5.4 mmHg. The mean mitral valve gradient is 2.0 mmHg. Tricuspid Valve: The tricuspid valve is normal in structure. Tricuspid valve regurgitation is not demonstrated. No evidence of tricuspid stenosis. Aortic Valve: The aortic valve is normal in structure. Aortic valve regurgitation is not visualized. No aortic stenosis is present. Aortic valve mean gradient measures 3.0 mmHg. Aortic valve peak gradient measures 5.3 mmHg. Aortic valve area, by VTI measures 2.77 cm. Pulmonic Valve: The pulmonic valve was normal in structure. Pulmonic valve regurgitation is not visualized. No evidence of pulmonic stenosis. Aorta: The aortic root is normal in size and structure. Venous: The inferior vena cava is normal in size with greater than 50% respiratory variability, suggesting right atrial pressure of 3 mmHg. IAS/Shunts: No atrial level shunt detected by color flow Doppler. Additional Comments: There is a large pleural effusion in the left lateral region.  LEFT VENTRICLE PLAX 2D LVIDd:         4.80 cm     Diastology LVIDs:         3.60 cm     LV e' medial:    5.44 cm/s LV PW:         0.80 cm     LV E/e' medial:  10.1 LV  IVS:        0.90 cm     LV e' lateral:   5.33 cm/s LVOT diam:     2.20 cm     LV E/e' lateral: 10.3 LV SV:         49 LV SV Index:   31 LVOT Area:     3.80 cm  LV Volumes (MOD) LV vol d, MOD A2C: 49.4 ml LV vol d, MOD A4C: 52.0 ml LV vol s, MOD A2C: 35.6 ml LV vol s, MOD A4C: 31.6 ml LV SV MOD A2C:     13.8 ml LV SV MOD A4C:     52.0 ml LV SV MOD BP:      17.5 ml RIGHT VENTRICLE RV Basal diam:  3.20 cm RV Mid diam:    2.50 cm RV S prime:     10.00 cm/s TAPSE (M-mode): 1.8 cm LEFT ATRIUM             Index LA Vol (A2C):   51.3 ml 32.50 ml/m LA Vol (A4C):   34.1 ml 21.60 ml/m LA Biplane Vol: 42.6 ml 26.99 ml/m  AORTIC VALVE                   PULMONIC VALVE AV Area (Vmax):    2.96 cm    PV Vmax:       0.77 m/s AV Area (Vmean):   2.79 cm    PV Vmean:      54.300 cm/s AV Area (VTI):     2.77 cm    PV VTI:        0.135 m AV Vmax:  115.00 cm/s PV Peak grad:  2.4 mmHg AV Vmean:          79.100 cm/s PV Mean grad:  1.0 mmHg AV VTI:            0.177 m AV Peak Grad:      5.3 mmHg AV Mean Grad:      3.0 mmHg LVOT Vmax:         89.40 cm/s LVOT Vmean:        58.100 cm/s LVOT VTI:          0.129 m LVOT/AV VTI ratio: 0.73  AORTA Ao Root diam: 3.30 cm Ao Asc diam:  3.00 cm MITRAL VALVE MV Area (PHT): 5.23 cm     SHUNTS MV Area VTI:   2.07 cm     Systemic VTI:  0.13 m MV Peak grad:  5.4 mmHg     Systemic Diam: 2.20 cm MV Mean grad:  2.0 mmHg MV Vmax:       1.16 m/s MV Vmean:      69.1 cm/s MV Decel Time: 145 msec MV E velocity: 54.70 cm/s MV A velocity: 101.00 cm/s MV E/A ratio:  0.54 Candee Furbish MD Electronically signed by Candee Furbish MD Signature Date/Time: 08/13/2020/4:09:06 PM    Final         Scheduled Meds: . Chlorhexidine Gluconate Cloth  6 each Topical Daily  . ferrous sulfate  325 mg Oral BID WC  . folic acid  1 mg Oral Daily  . LORazepam  0.5 mg Intravenous Once  . multivitamin with minerals  1 tablet Oral Daily  . nicotine  21 mg Transdermal Daily   Continuous Infusions: . thiamine injection  250 mg (08/13/20 1428)          Aline August, MD [facility] 08/14/2020, 7:51 AM

## 2020-08-14 NOTE — TOC Progression Note (Addendum)
Transition of Care [facility]) - Progression Note    Patient Details  Name: Brenda [name] MRN: [id number] Date of Birth: 26-Jan-1961  Transition of Care [facility]) CM/SW [place], [place] Phone Number: 08/14/2020, 3:27 PM  Clinical Narrative:   Met with patient after seeing PT note that she had been up and walking, though continues need to assist with bed mobility, transfers. [name] is pleased with her progress, states she plans to return home.  I asked about her plan to have niece stay with her, and she dialed Melissa at [phone].  Melissa stated she is unwilling to come to stay with her in the motel, but did say she would talk to family about anyone else that might come to stay with her while also encouraging her to take advantage of the SNF offer.  At that point, [name] indicated she was not interested in talking about the situation any longer, and our conversation was cut off. At this point, patient is planning on returning to [place] [address] [place].  TOC will continue to follow during the course of hospitalization.     Expected Discharge Plan: Skilled Nursing Facility Barriers to Discharge: SNF Pending bed offer  Expected Discharge Plan and Services Expected Discharge Plan: [place]   Discharge Planning Services: CM Consult Post Acute Care Choice: [place] Living arrangements for the past 2 months: Hotel/Motel                                       Social Determinants of Health (SDOH) Interventions    Readmission Risk Interventions Readmission Risk Prevention Plan 07/17/2020 04/11/2020  Transportation Screening Complete Complete  PCP or Specialist Appt within 3-5 Days - Complete  HRI or [place] Complete Complete  Social Work Consult for [place] Planning/Counseling Complete Complete  Palliative Care Screening Not Applicable Not Applicable  Medication Review Press photographer) Complete  Complete  Some recent data might be hidden

## 2020-08-15 ENCOUNTER — Inpatient Hospital Stay (HOSPITAL_COMMUNITY): Payer: Medicaid Other

## 2020-08-15 ENCOUNTER — Telehealth: Payer: Self-pay | Admitting: Medical Oncology

## 2020-08-15 DIAGNOSIS — J90 Pleural effusion, not elsewhere classified: Secondary | ICD-10-CM

## 2020-08-15 DIAGNOSIS — I5043 Acute on chronic combined systolic (congestive) and diastolic (congestive) heart failure: Secondary | ICD-10-CM

## 2020-08-15 DIAGNOSIS — N179 Acute kidney failure, unspecified: Secondary | ICD-10-CM

## 2020-08-15 DIAGNOSIS — Z9889 Other specified postprocedural states: Secondary | ICD-10-CM

## 2020-08-15 LAB — CBC WITH DIFFERENTIAL/PLATELET
Abs Immature Granulocytes: 0.17 10*3/uL — ABNORMAL HIGH (ref 0.00–0.07)
Basophils Absolute: 0 10*3/uL (ref 0.0–0.1)
Basophils Relative: 0 %
Eosinophils Absolute: 0 10*3/uL (ref 0.0–0.5)
Eosinophils Relative: 0 %
HCT: 26.3 % — ABNORMAL LOW (ref 36.0–46.0)
Hemoglobin: 8.6 g/dL — ABNORMAL LOW (ref 12.0–15.0)
Immature Granulocytes: 3 %
Lymphocytes Relative: 33 %
Lymphs Abs: 1.9 10*3/uL (ref 0.7–4.0)
MCH: 30.8 pg (ref 26.0–34.0)
MCHC: 32.7 g/dL (ref 30.0–36.0)
MCV: 94.3 fL (ref 80.0–100.0)
Monocytes Absolute: 1.3 10*3/uL — ABNORMAL HIGH (ref 0.1–1.0)
Monocytes Relative: 23 %
Neutro Abs: 2.3 10*3/uL (ref 1.7–7.7)
Neutrophils Relative %: 41 %
Platelets: 308 10*3/uL (ref 150–400)
RBC: 2.79 MIL/uL — ABNORMAL LOW (ref 3.87–5.11)
RDW: 15.9 % — ABNORMAL HIGH (ref 11.5–15.5)
WBC: 5.6 10*3/uL (ref 4.0–10.5)
nRBC: 0 % (ref 0.0–0.2)

## 2020-08-15 LAB — BASIC METABOLIC PANEL
Anion gap: 4 — ABNORMAL LOW (ref 5–15)
BUN: 31 mg/dL — ABNORMAL HIGH (ref 6–20)
CO2: 25 mmol/L (ref 22–32)
Calcium: 8.3 mg/dL — ABNORMAL LOW (ref 8.9–10.3)
Chloride: 106 mmol/L (ref 98–111)
Creatinine, Ser: 2.77 mg/dL — ABNORMAL HIGH (ref 0.44–1.00)
GFR, Estimated: 19 mL/min — ABNORMAL LOW (ref >60)
Glucose, Bld: 85 mg/dL (ref 70–99)
Potassium: 3.9 mmol/L (ref 3.5–5.1)
Sodium: 135 mmol/L (ref 135–145)

## 2020-08-15 LAB — COMPREHENSIVE METABOLIC PANEL
ALT: 13 U/L (ref 0–44)
AST: 24 U/L (ref 15–41)
Albumin: 2.3 g/dL — ABNORMAL LOW (ref 3.5–5.0)
Alkaline Phosphatase: 128 U/L — ABNORMAL HIGH (ref 38–126)
Anion gap: 9 (ref 5–15)
BUN: 29 mg/dL — ABNORMAL HIGH (ref 6–20)
CO2: 24 mmol/L (ref 22–32)
Calcium: 8.7 mg/dL — ABNORMAL LOW (ref 8.9–10.3)
Chloride: 104 mmol/L (ref 98–111)
Creatinine, Ser: 2.93 mg/dL — ABNORMAL HIGH (ref 0.44–1.00)
GFR, Estimated: 18 mL/min — ABNORMAL LOW (ref >60)
Glucose, Bld: 172 mg/dL — ABNORMAL HIGH (ref 70–99)
Potassium: 3.7 mmol/L (ref 3.5–5.1)
Sodium: 137 mmol/L (ref 135–145)
Total Bilirubin: 0.5 mg/dL (ref 0.3–1.2)
Total Protein: 6.9 g/dL (ref 6.5–8.1)

## 2020-08-15 LAB — BODY FLUID CELL COUNT WITH DIFFERENTIAL
Eos, Fluid: 0 %
Lymphs, Fluid: 47 %
Monocyte-Macrophage-Serous Fluid: 48 % — ABNORMAL LOW (ref 50–90)
Neutrophil Count, Fluid: 5 % (ref 0–25)
Total Nucleated Cell Count, Fluid: 83 cu mm (ref 0–1000)

## 2020-08-15 LAB — LACTATE DEHYDROGENASE: LDH: 154 U/L (ref 98–192)

## 2020-08-15 LAB — GLUCOSE, PLEURAL OR PERITONEAL FLUID: Glucose, Fluid: 98 mg/dL

## 2020-08-15 LAB — ALBUMIN, PLEURAL OR PERITONEAL FLUID: Albumin, Fluid: 1.7 g/dL

## 2020-08-15 LAB — MAGNESIUM: Magnesium: 2.1 mg/dL (ref 1.7–2.4)

## 2020-08-15 LAB — LACTATE DEHYDROGENASE, PLEURAL OR PERITONEAL FLUID: LD, Fluid: 185 U/L — ABNORMAL HIGH (ref 3–23)

## 2020-08-15 LAB — PROTEIN, PLEURAL OR PERITONEAL FLUID: Total protein, fluid: 3.9 g/dL

## 2020-08-15 IMAGING — DX DG CHEST 1V
1 series · 1 of 1 positions shown · non-contrast
Comparison: [DATE].

CLINICAL DATA: Status post left thoracentesis.

EXAM:
CHEST  1 VIEW

[chest ap]
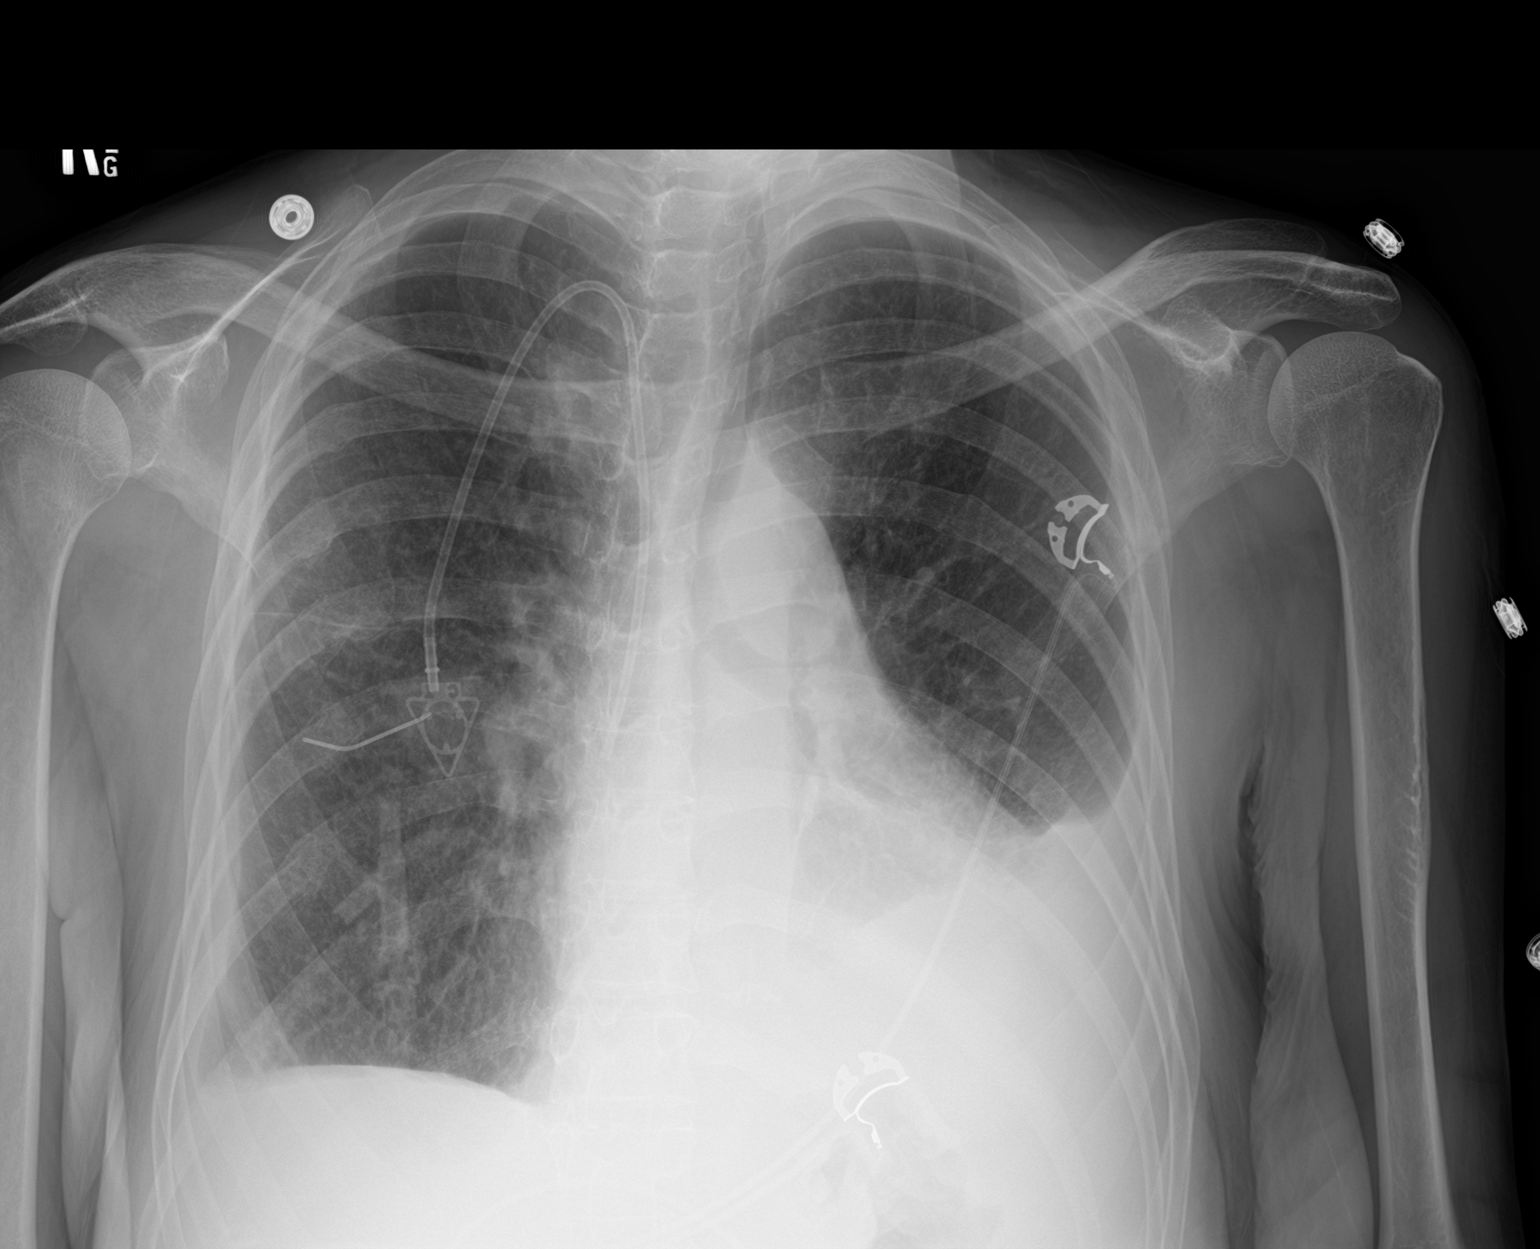

[1 of 1 positions shown; findings below may reference images not displayed]

FINDINGS: Left pleural effusion is smaller status post thoracentesis. No
pneumothorax is noted.
IMPRESSION: No pneumothorax status post left thoracentesis.

## 2020-08-15 IMAGING — US US THORACENTESIS ASP PLEURAL SPACE W/IMG GUIDE
1 series · 6 of 6 positions shown · non-contrast
Comparison: none

INDICATION: Patient with history of stage IV lung cancer, dyspnea, CKD stage IV,
and left pleural effusion. Request is made for diagnostic and
therapeutic left thoracentesis.

[Series 1: us thoracentesis asp pleural s mc & wl · 6 of 6 slices shown]
[im 1/6]
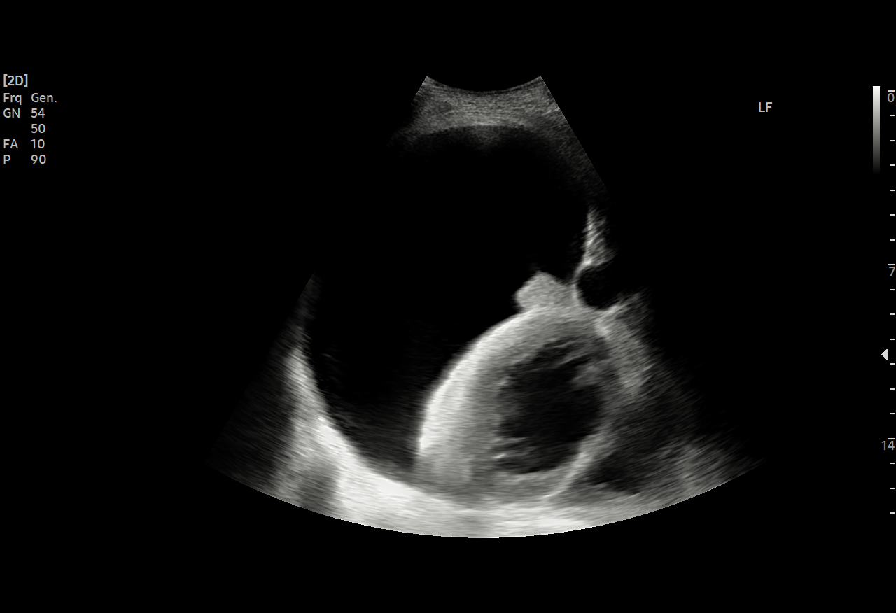
[im 2/6]
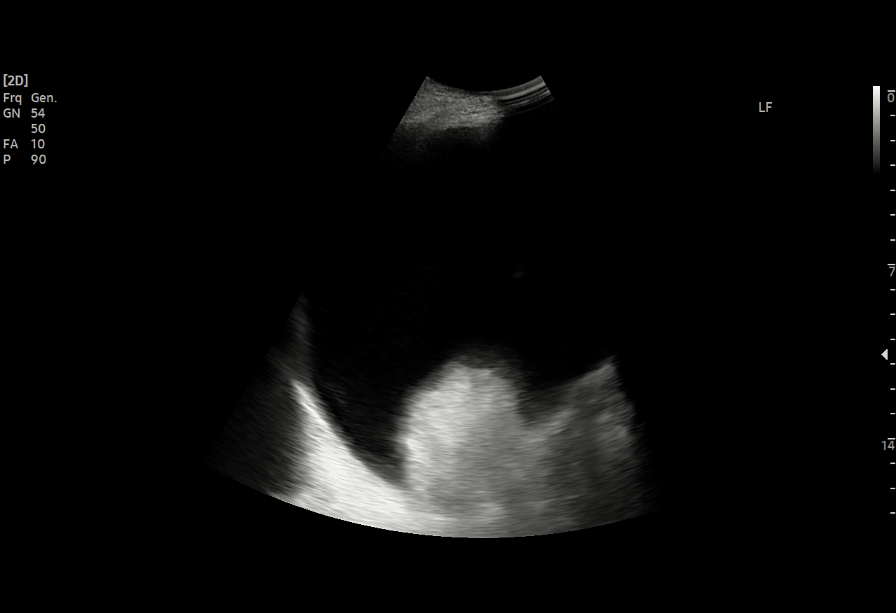
[im 3/6]
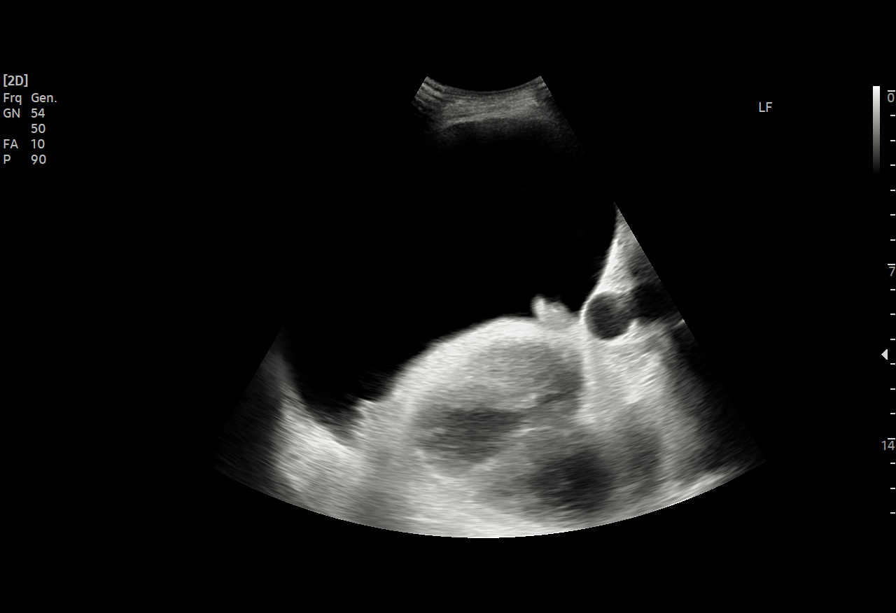
[im 4/6]
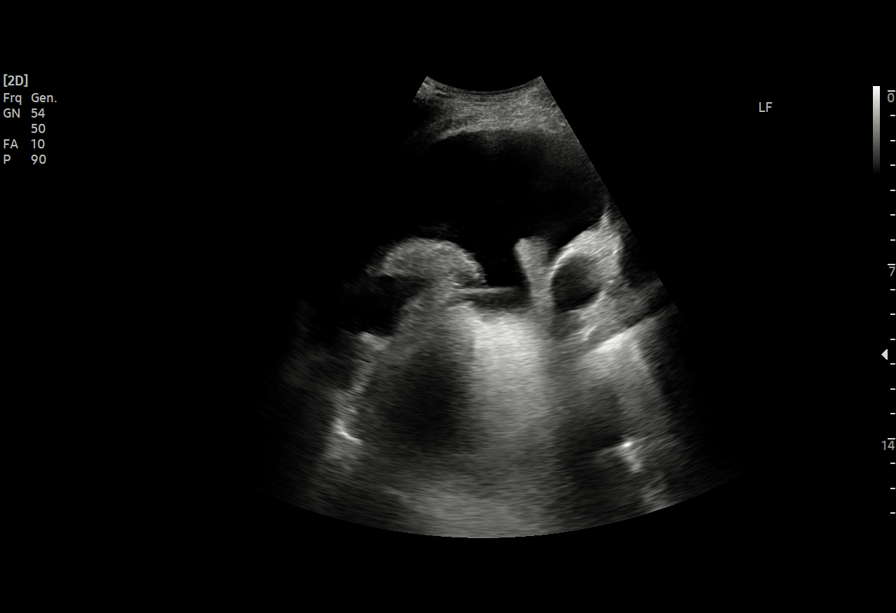
[im 5/6]
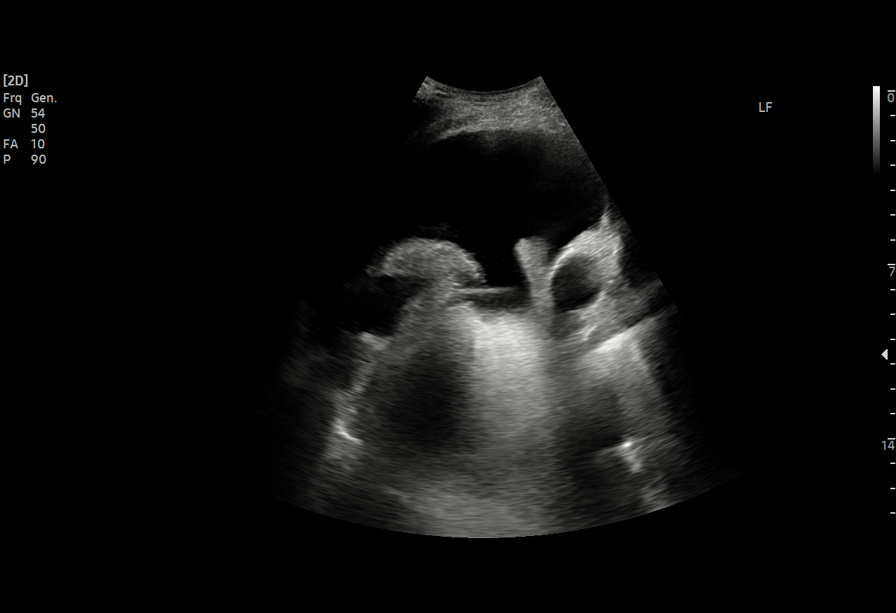
[im 6/6]
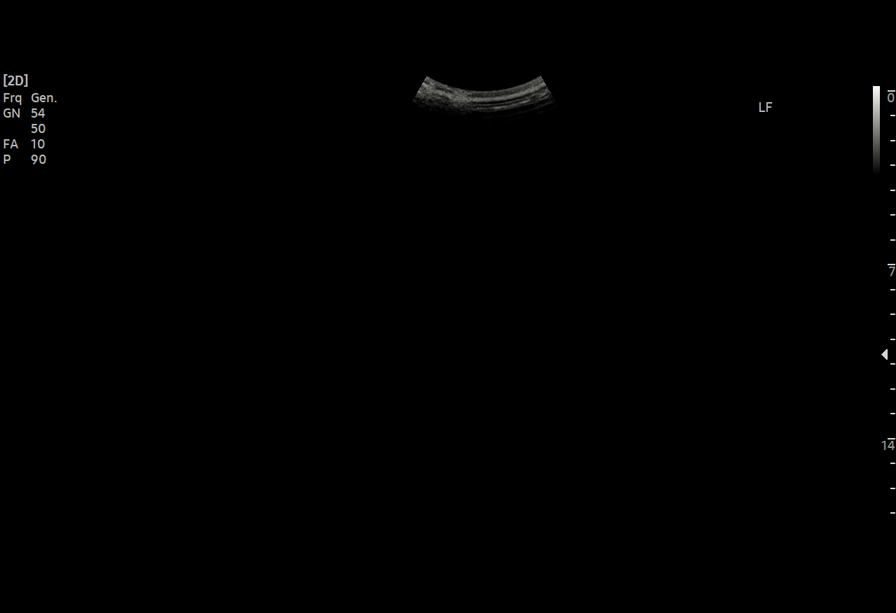

[6 of 6 positions shown; findings below may reference images not displayed]

EXAM:
ULTRASOUND GUIDED DIAGNOSTIC AND THERAPEUTIC LEFT THORACENTESIS

MEDICATIONS:
10 mL 1% lidocaine

COMPLICATIONS:
None immediate.

PROCEDURE:
An ultrasound guided thoracentesis was thoroughly discussed with the
patient and questions answered. The benefits, risks, alternatives
and complications were also discussed. The patient understands and
wishes to proceed with the procedure. Written consent was obtained.

Ultrasound was performed to localize and mark an adequate pocket of
fluid in the left chest. The area was then prepped and draped in the
normal sterile fashion. 1% Lidocaine was used for local anesthesia.
Under ultrasound guidance a 6 Fr Safe-T-Centesis catheter was
introduced. Thoracentesis was performed. The catheter was removed
and a dressing applied.
FINDINGS: A total of approximately 1.1 L of hazy amber fluid was removed.
Procedure was stopped at 1.1 L per patient's request secondary to
patient's symptoms (chest pain, coughing). Samples were sent to the
laboratory as requested by the clinical team.
IMPRESSION: Successful ultrasound guided left thoracentesis yielding 1.1 L of
pleural fluid.

## 2020-08-15 MED ORDER — LIDOCAINE HCL 1 % IJ SOLN
INTRAMUSCULAR | Status: AC
  Filled 2020-08-15: qty 20

## 2020-08-15 NOTE — Procedures (Signed)
PROCEDURE SUMMARY:  Successful image-guided left thoracentesis. Yielded 1.1 liters of hazy amber fluid. Procedure was stopped at 1.1 liters per patient's request secondary to patient's symptoms (chest pain, coughing). Patient tolerated procedure well. No immediate complications. EBL = 0 mL.  Specimen was sent for labs. CXR ordered.  Please see imaging section of Epic for full dictation.   Claris Pong Jlon Betker PA-C 08/15/2020 11:33 AM

## 2020-08-15 NOTE — TOC Progression Note (Signed)
Transition of Care [facility]) - Progression Note    Patient Details  Name: Brenda [name] MRN: [id number] Date of Birth: [birth year]  [facility]) CM/SW [place], [place] Phone Number: 08/15/2020, 4:02 PM  Clinical Narrative:  Patient again confirms her desire to return home and not go to SNF.  I found that Cindie with [facility] is willing to provide [facility] PT services for this patient upon d/c.  TOC will continue to follow during the course of hospitalization.     Expected Discharge Plan: Skilled Nursing Facility Barriers to Discharge: SNF Pending bed offer  Expected Discharge Plan and Services Expected Discharge Plan: [place]   Discharge Planning Services: CM Consult Post Acute Care Choice: [place] Living arrangements for the past 2 months: Hotel/Motel                                       Social Determinants of Health (SDOH) Interventions    Readmission Risk Interventions Readmission Risk Prevention Plan 07/17/2020 04/11/2020  Transportation Screening Complete Complete  PCP or Specialist Appt within 3-5 Days - Complete  HRI or [place] Complete Complete  Social Work Consult for [place] Planning/Counseling Complete Complete  Palliative Care Screening Not Applicable Not Applicable  Medication Review Press photographer) Complete Complete  Some recent data might be hidden

## 2020-08-15 NOTE — Progress Notes (Signed)
Physical Therapy Treatment Patient Details Name: Brenda [name] MRN: [id number] DOB: 16-Apr-1960 Today's Date: 08/15/2020    History of Present Illness 60 years old female adm with AMS, hypokalemia and acute on chronic anemia.  Pt also with recent adm with Bacteremia associated with intravascular line, ARF, acute GI bleed, pancytopenia.   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  Pt refused recommended SNF level rehab following last hospital admit.    PT Comments    Pt with improved mobility today and ambulated 240 feet with RW.  Pt plans to d/c home and will need RW upon d/c for safe mobility.  Pt agreeable to remain in recliner end of session.   Follow Up Recommendations  Home health PT;Supervision for mobility/OOB     Equipment Recommendations  Rolling walker with 5" wheels    Recommendations for Other Services       Precautions / Restrictions Precautions Precautions: Fall    Mobility  Bed Mobility Overal bed mobility: Needs Assistance Bed Mobility: Supine to Sit     Supine to sit: Supervision;HOB elevated          Transfers Overall transfer level: Needs assistance Equipment used: Rolling walker (2 wheeled) Transfers: Sit to/from Stand Sit to Stand: Min guard;Min assist         General transfer comment: verbal cues for hand placement, assist from lower toilet with use of grab bar, min/guard from bed  Ambulation/Gait Ambulation/Gait assistance: Min guard Gait Distance (Feet): 240 Feet Assistive device: Rolling walker (2 wheeled) Gait Pattern/deviations: Step-through pattern;Decreased stride length;Trunk flexed     General Gait Details: Cues for posture, position from [employer]             Wheelchair Mobility    Modified Rankin (Stroke Patients Only)       Balance                                            Cognition  Arousal/Alertness: Awake/alert Behavior During Therapy: WFL for tasks assessed/performed;Flat affect Overall Cognitive Status: Within Functional Limits for tasks assessed                                        Exercises      General Comments        Pertinent Vitals/Pain Pain Assessment: No/denies pain    Home Living                      Prior Function            PT Goals (current goals can now be found in the care plan section) Progress towards PT goals: Progressing toward goals    Frequency    Min 3X/week      PT Plan Discharge plan needs to be updated    Co-evaluation              AM-PAC PT "6 Clicks" Mobility   Outcome Measure  Help needed turning from your back to your side while in a flat bed without using bedrails?: A Little Help needed moving from lying on your back to sitting on the side of a flat bed without using bedrails?: A Little Help needed moving  to and from a bed to a chair (including a wheelchair)?: A Little Help needed standing up from a chair using your arms (e.g., wheelchair or bedside chair)?: A Little Help needed to walk in hospital room?: A Little Help needed climbing 3-5 steps with a railing? : A Little 6 Click Score: 18    End of Session Equipment Utilized During Treatment: Gait belt Activity Tolerance: Patient tolerated treatment well Patient left: in chair;with call bell/phone within reach;with chair alarm set Nurse Communication: Mobility status PT Visit Diagnosis: Muscle weakness (generalized) (M62.81);Difficulty in walking, not elsewhere classified (R26.2);Unsteadiness on feet (R26.81);History of falling (Z91.81)     Time: 1829-9371 PT Time Calculation (min) (ACUTE ONLY): 16 min  Charges:  $Gait Training: 8-22 mins                    Jannette Spanner PT, DPT Acute Rehabilitation Services Pager: [phone] Office: [phone]  Trena Platt 08/15/2020, 12:44 PM

## 2020-08-15 NOTE — Plan of Care (Signed)
  Problem: Nutrition: Goal: Adequate nutrition will be maintained Outcome: Progressing   Problem: Coping: Goal: Level of anxiety will decrease Outcome: Progressing   Problem: Pain Managment: Goal: General experience of comfort will improve Outcome: Progressing   Problem: Skin Integrity: Goal: Risk for impaired skin integrity will decrease Outcome: Progressing   Problem: Education: Goal: Knowledge of General Education information will improve Description: Including pain rating scale, medication(s)/side effects and non-pharmacologic comfort measures Outcome: Progressing   Problem: Health Behavior/Discharge Planning: Goal: Ability to manage health-related needs will improve Outcome: Progressing

## 2020-08-15 NOTE — Telephone Encounter (Signed)
Per  Dr Julien Nordmann I told Brenda [name] that he will only sign PT orders for pt.

## 2020-08-15 NOTE — Progress Notes (Signed)
PROGRESS NOTE    Brenda [name]  FMB:[id number] DOB: December 20, 1960 DOA: 08/09/2020 PCP: Pcp, No (Confirm with patient/family/NH records and if not entered, this HAS to be entered at [facility] point of entry. "No PCP" if truly none.)   Chief Complaint  Patient presents with  . Altered Mental Status    Brief Narrative:  60 year old female with history of stage IV lung cancer status post 12 cycles of pemetrexed and pembrolizumab, depression, breast cancer, alcohol abuse, polysubstance abuse, tobacco abuse recent admission and discharge home (refused SNF placement) from 07/01/2020-08/02/2020 for AKI on CKD stage IV/seizures/pancytopenia presented on 08/09/2020 with altered mental status and confusion along with poor oral intake and nausea with vomiting.  On presentation, potassium was 2.9; creatinine of 3.51.  COVID-19 testing was negative.  Urine drug screen was positive for cocaine.  CT of the head without contrast was negative for acute intracranial abnormality.   Assessment & Plan:   Principal Problem:   Hypokalemia Active Problems:   Alcohol abuse   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 (HCC)   Malignant neoplasm of right lung (HCC)   Acute on chronic anemia   Acute encephalopathy   Pancytopenia (HCC)   Polysubstance abuse (HCC)   AKI (acute kidney injury) ([place])  1 acute metabolic encephalopathy -Questionable etiology. -CT head negative for any acute abnormalities.  MRI brain negative for any acute abnormalities. -UDS was positive for cocaine. -EEG negative for seizures. -Ammonia, TSH, folate, vitamin B12 levels within normal limits. -Patient improving clinically. -May need outpatient follow-up with neurology. -PT recommending initially recommending SNF however patient refusing SNF and likely will go home with home health therapies.  2.  Hypokalemia/hypomagnesemia -Repleted.  3.  Stage IV lung cancer -Patient followed by oncology during the hospitalization. -Patient seen by palliative care and wants to  remain full code. -Outpatient follow-up with oncology.  4.  Polysubstance abuse/history of alcohol abuse/tobacco abuse -Unsure about last cocaine use however UDS was positive for cocaine on admission. -Patient received thiamine 500 mg daily x3 days, continue thiamine 250 mg daily x3 days and then thiamine 100 mg daily and on discharge. -Polysubstance cessation stressed to patient.  5.  Chronic kidney disease stage IV -Currently close to baseline. -Outpatient follow-up.  6.  Left pleural effusion -Noted on chest x-ray and on 2D echo. -Ultrasound guided therapeutic and diagnostic thoracentesis done earlier on this morning with 1.1 L of hazy amber fluid removed with labs pending. -Follow.  7.  Persistent sinus tachycardia -2D echo with a EF of 40 to 70%, grade 1 diastolic dysfunction. -.  Heart rate improved in the low 100s. -Patient seen by cardiology and not recommending any medical treatment at this time.  8.  New diagnosis of combined CHF -2D echo done with EF of 40 to 17%, grade 1 diastolic dysfunction. -Patient received couple of doses of IV Lasix with urine output of 1.775 L over the past 24 hours. -Patient seen in consultation back cardiology who do not feel patient is a candidate for any ischemic work-up at this time and not recommending any medical therapy at this time.  9.  Anemia of chronic disease -Status post transfusion of 1 unit packed red blood cells. -H&H stable. -Outpatient follow-up.  10.  Metabolic acidosis -Could have been secondary to chronic kidney disease stage IV. -Was on bicarb tablets however has resolved.  11.  General deconditioning PT/OT recommending SNF however patient refusing SNF and as such we will order home health therapies. -    DVT prophylaxis: SCDs Code Status: Full  Family Communication: Updated patient.  No family at bedside. Disposition:   Status is: Inpatient    Dispo: The patient is from: Home              Anticipated d/c is  to: Home with home health versus SNF              Patient currently status post ultrasound-guided paracentesis, not stable for discharge.   Difficult to place patient no       Consultants:   Cardiology: Dr.Schumann 08/14/2020  Palliative care: Dr. Domingo Cocking 08/11/2020  Procedures:   Chest x-ray 08/09/2020, 08/11/2020 08/13/2020, 08/15/2020,  Abdominal films 08/13/2020  MRI brain 08/10/2020  2 d echo 08/13/2020  CT head 08/09/2020  Ultrasound-guided thoracentesis 08/15/2020--1.1 L of pleural fluid removed per Dr. Pascal Lux  EEG  Antimicrobials:  None   Subjective: Patient laying in bed.  Complaining of some left-sided upper back discomfort after ultrasound-guided paracentesis was done earlier on.  Denies any significant shortness of breath.  No chest pain.  Feels better than on admission.  Alert and oriented to self place and time.  Objective: Vitals:   08/14/20 2010 08/15/20 0417 08/15/20 0418 08/15/20 1122  BP: 122/82 122/82  109/71  Pulse: (!) 103 (!) 102    Resp: 18 18  18   Temp: 98.7 F (37.1 C) 98.4 F (36.9 C)    TempSrc: Oral     SpO2: 100% 98%    Weight:   55.3 kg     Intake/Output Summary (Last 24 hours) at 08/15/2020 1230 Last data filed at 08/15/2020 0911 Gross per 24 hour  Intake 834 ml  Output 1775 ml  Net -941 ml   Filed Weights   08/13/20 0447 08/14/20 0431 08/15/20 0418  Weight: 52 kg 55.8 kg 55.3 kg    Examination:  General exam: Appears calm and comfortable  Respiratory system: Decreased breath sounds in the left base otherwise clear.  No wheezing, no crackles, no rhonchi.  Fair air movement.  Speaking in full sentences.  Cardiovascular system: Regular rate rhythm no murmurs rubs or gallops.  No JVD.  No lower extremity edema.  Gastrointestinal system: Abdomen is nondistended, soft and nontender. No organomegaly or masses felt. Normal bowel sounds heard. Central nervous system: Alert and oriented. No focal neurological deficits. Extremities:  Symmetric 5 x 5 power. Skin: No rashes, lesions or ulcers Psychiatry: Judgement and insight appear normal. Mood & affect appropriate.     Data Reviewed: I have personally reviewed following labs and imaging studies  CBC: Recent Labs  Lab 08/11/20 0847 08/12/20 0433 08/13/20 0525 08/14/20 0340 08/15/20 0350  WBC 7.0 5.0 6.0 5.7 5.6  NEUTROABS 4.0 2.6 2.5 2.3 2.3  HGB 8.2* 7.0* 8.5* 8.4* 8.6*  HCT 25.6* 22.0* 25.8* 26.0* 26.3*  MCV 90.8 92.8 92.1 93.5 94.3  PLT 305 252 238 253 761    Basic Metabolic Panel: Recent Labs  Lab 08/11/20 0847 08/12/20 0433 08/13/20 0525 08/14/20 0340 08/15/20 0350 08/15/20 1008  NA 142 145 137 137 135 137  K 3.8 3.6 3.7 4.0 3.9 3.7  CL 114* 113* 106 105 106 104  CO2 17* 25 24 25 25 24   GLUCOSE 114* 107* 84 83 85 172*  BUN 22* 27* 30* 32* 31* 29*  CREATININE 2.52* 2.59* 2.80* 2.82* 2.77* 2.93*  CALCIUM 8.4* 8.6* 8.3* 8.4* 8.3* 8.7*  MG 1.5* 1.8 1.6* 2.2 2.1  --     GFR: Estimated Creatinine Clearance: 18.5 mL/min (A) (by C-G formula based on SCr  of 2.93 mg/dL (H)).  Liver Function Tests: Recent Labs  Lab 08/09/20 1450 08/10/20 0827 08/11/20 0847 08/15/20 1008  AST 15 16 16 24   ALT 12 10 10 13   ALKPHOS 121 120 121 128*  BILITOT 0.9 1.2 0.3 0.5  PROT 6.8 6.7 6.7 6.9  ALBUMIN 2.3* 2.0* 2.0* 2.3*    CBG: No results for input(s): GLUCAP in the last 168 hours.   Recent Results (from the past 240 hour(s))  Resp Panel by RT-PCR (Flu A&B, Covid) Nasopharyngeal Swab     Status: None   Collection Time: 08/09/20  3:08 PM   Specimen: Nasopharyngeal Swab; Nasopharyngeal(NP) swabs in vial transport medium  Result Value Ref Range Status   SARS Coronavirus 2 by RT PCR NEGATIVE NEGATIVE Final    Comment: (NOTE) SARS-CoV-2 target nucleic acids are NOT DETECTED.  The SARS-CoV-2 RNA is generally detectable in upper respiratory specimens during the acute phase of infection. The lowest concentration of SARS-CoV-2 viral copies this assay  can detect is 138 copies/mL. A negative result does not preclude SARS-Cov-2 infection and should not be used as the sole basis for treatment or other patient management decisions. A negative result may occur with  improper specimen collection/handling, submission of specimen other than nasopharyngeal swab, presence of viral mutation(s) within the areas targeted by this assay, and inadequate number of viral copies(<138 copies/mL). A negative result must be combined with clinical observations, patient history, and epidemiological information. The expected result is Negative.  Fact Sheet for Patients:  EntrepreneurPulse.com.au  Fact Sheet for Healthcare Providers:  IncredibleEmployment.be  This test is no t yet approved or cleared by the [place] FDA and  has been authorized for detection and/or diagnosis of SARS-CoV-2 by FDA under an Emergency Use Authorization (EUA). This EUA will remain  in effect (meaning this test can be used) for the duration of the COVID-19 declaration under Section 564(b)(1) of the Act, 21 U.S.C.section 360bbb-3(b)(1), unless the authorization is terminated  or revoked sooner.       Influenza A by PCR NEGATIVE NEGATIVE Final   Influenza B by PCR NEGATIVE NEGATIVE Final    Comment: (NOTE) The Xpert Xpress SARS-CoV-2/FLU/RSV plus assay is intended as an aid in the diagnosis of influenza from Nasopharyngeal swab specimens and should not be used as a sole basis for treatment. Nasal washings and aspirates are unacceptable for Xpert Xpress SARS-CoV-2/FLU/RSV testing.  Fact Sheet for Patients: EntrepreneurPulse.com.au  Fact Sheet for Healthcare Providers: IncredibleEmployment.be  This test is not yet approved or cleared by the [place] FDA and has been authorized for detection and/or diagnosis of SARS-CoV-2 by FDA under an Emergency Use Authorization (EUA). This EUA will  remain in effect (meaning this test can be used) for the duration of the COVID-19 declaration under Section 564(b)(1) of the Act, 21 U.S.C. section 360bbb-3(b)(1), unless the authorization is terminated or revoked.  Performed at [facility], [place] [address], [place], [place] [address]   Urine Culture     Status: None   Collection Time: 08/09/20  4:28 PM   Specimen: Urine, Random  Result Value Ref Range Status   Specimen Description   Final    URINE, RANDOM Performed at [place] [address], [place], [place] [address]    Special Requests   Final    NONE Performed at [facility], [place] [address], [place], [place] [address]    Culture   Final    NO GROWTH Performed at [facility], [place] [address] [place], [place] [address]    Report Status 08/11/2020 FINAL  Final         Radiology Studies: DG Chest 1 View  Result Date: 08/15/2020 CLINICAL DATA:  Status post left thoracentesis. EXAM: CHEST  1 VIEW COMPARISON:  Aug 11, 2020. FINDINGS: Left pleural effusion is smaller status post thoracentesis. No pneumothorax is noted. IMPRESSION: No pneumothorax status post left thoracentesis. Electronically Signed   By: Marijo Conception M.D.   On: 08/15/2020 11:56   ECHOCARDIOGRAM COMPLETE  Result Date: 08/13/2020    ECHOCARDIOGRAM REPORT   Patient Name:   Brenda [name] Date of Exam: 08/13/2020 Medical Rec #:  [id number]      Height:       66.0 in Accession #:    [id number]     Weight:       114.6 lb Date of Birth:  1960-07-13      BSA:          1.579 m Patient Age:    37 years       BP:           103/72 mmHg Patient Gender: F              HR:           106 bpm. Exam Location:  Inpatient Procedure: 2D Echo, Cardiac Doppler and Color Doppler Indications:    Dyspnea  History:        Patient has no prior history of Echocardiogram examinations.  Sonographer:    Luisa Hart RDCS Referring Phys: [id number] [facility]  Sonographer  Comments: Patient is morbidly obese. Image acquisition challenging due to patient body habitus. IMPRESSIONS  1. Left ventricular ejection fraction, by estimation, is 40 to 45%. The left ventricle has mildly decreased function. The left ventricle demonstrates global hypokinesis. Left ventricular diastolic parameters are consistent with Grade I diastolic dysfunction (impaired relaxation).  2. Right ventricular systolic function is normal. The right ventricular size is normal.  3. Large pleural effusion in the left lateral region.  4. The mitral valve is normal in structure. No evidence of mitral valve regurgitation. No evidence of mitral stenosis.  5. The aortic valve is normal in structure. Aortic valve regurgitation is not visualized. No aortic stenosis is present.  6. The inferior vena cava is normal in size with greater than 50% respiratory variability, suggesting right atrial pressure of 3 mmHg. FINDINGS  Left Ventricle: Left ventricular ejection fraction, by estimation, is 40 to 45%. The left ventricle has mildly decreased function. The left ventricle demonstrates global hypokinesis. The left ventricular internal cavity size was normal in size. There is  no left ventricular hypertrophy. Left ventricular diastolic parameters are consistent with Grade I diastolic dysfunction (impaired relaxation). Right Ventricle: The right ventricular size is normal. No increase in right ventricular wall thickness. Right ventricular systolic function is normal. Left Atrium: Left atrial size was normal in size. Right Atrium: Right atrial size was normal in size. Pericardium: There is no evidence of pericardial effusion. Mitral Valve: The mitral valve is normal in structure. No evidence of mitral valve regurgitation. No evidence of mitral valve stenosis. MV peak gradient, 5.4 mmHg. The mean mitral valve gradient is 2.0 mmHg. Tricuspid Valve: The tricuspid valve is normal in structure. Tricuspid valve regurgitation is not  demonstrated. No evidence of tricuspid stenosis. Aortic Valve: The aortic valve is normal in structure. Aortic valve regurgitation is not visualized. No aortic stenosis is present. Aortic valve mean gradient  measures 3.0 mmHg. Aortic valve peak gradient measures 5.3 mmHg. Aortic valve area, by VTI measures 2.77 cm. Pulmonic Valve: The pulmonic valve was normal in structure. Pulmonic valve regurgitation is not visualized. No evidence of pulmonic stenosis. Aorta: The aortic root is normal in size and structure. Venous: The inferior vena cava is normal in size with greater than 50% respiratory variability, suggesting right atrial pressure of 3 mmHg. IAS/Shunts: No atrial level shunt detected by color flow Doppler. Additional Comments: There is a large pleural effusion in the left lateral region.  LEFT VENTRICLE PLAX 2D LVIDd:         4.80 cm     Diastology LVIDs:         3.60 cm     LV e' medial:    5.44 cm/s LV PW:         0.80 cm     LV E/e' medial:  10.1 LV IVS:        0.90 cm     LV e' lateral:   5.33 cm/s LVOT diam:     2.20 cm     LV E/e' lateral: 10.3 LV SV:         49 LV SV Index:   31 LVOT Area:     3.80 cm  LV Volumes (MOD) LV vol d, MOD A2C: 49.4 ml LV vol d, MOD A4C: 52.0 ml LV vol s, MOD A2C: 35.6 ml LV vol s, MOD A4C: 31.6 ml LV SV MOD A2C:     13.8 ml LV SV MOD A4C:     52.0 ml LV SV MOD BP:      17.5 ml RIGHT VENTRICLE RV Basal diam:  3.20 cm RV Mid diam:    2.50 cm RV S prime:     10.00 cm/s TAPSE (M-mode): 1.8 cm LEFT ATRIUM             Index LA Vol (A2C):   51.3 ml 32.50 ml/m LA Vol (A4C):   34.1 ml 21.60 ml/m LA Biplane Vol: 42.6 ml 26.99 ml/m  AORTIC VALVE                   PULMONIC VALVE AV Area (Vmax):    2.96 cm    PV Vmax:       0.77 m/s AV Area (Vmean):   2.79 cm    PV Vmean:      54.300 cm/s AV Area (VTI):     2.77 cm    PV VTI:        0.135 m AV Vmax:           115.00 cm/s PV Peak grad:  2.4 mmHg AV Vmean:          79.100 cm/s PV Mean grad:  1.0 mmHg AV VTI:            0.177 m AV  Peak Grad:      5.3 mmHg AV Mean Grad:      3.0 mmHg LVOT Vmax:         89.40 cm/s LVOT Vmean:        58.100 cm/s LVOT VTI:          0.129 m LVOT/AV VTI ratio: 0.73  AORTA Ao Root diam: 3.30 cm Ao Asc diam:  3.00 cm MITRAL VALVE MV Area (PHT): 5.23 cm     SHUNTS MV Area VTI:   2.07 cm     Systemic VTI:  0.13 m MV Peak grad:  5.4  mmHg     Systemic Diam: 2.20 cm MV Mean grad:  2.0 mmHg MV Vmax:       1.16 m/s MV Vmean:      69.1 cm/s MV Decel Time: 145 msec MV E velocity: 54.70 cm/s MV A velocity: 101.00 cm/s MV E/A ratio:  0.54 Candee Furbish MD Electronically signed by Candee Furbish MD Signature Date/Time: 08/13/2020/4:09:06 PM    Final    US THORACENTESIS ASP PLEURAL SPACE W/IMG GUIDE  Result Date: 08/15/2020 INDICATION: Patient with history of stage IV lung cancer, dyspnea, CKD stage IV, and left pleural effusion. Request is made for diagnostic and therapeutic left thoracentesis. EXAM: ULTRASOUND GUIDED DIAGNOSTIC AND THERAPEUTIC LEFT THORACENTESIS MEDICATIONS: 10 mL 1% lidocaine COMPLICATIONS: None immediate. PROCEDURE: An ultrasound guided thoracentesis was thoroughly discussed with the patient and questions answered. The benefits, risks, alternatives and complications were also discussed. The patient understands and wishes to proceed with the procedure. Written consent was obtained. Ultrasound was performed to localize and mark an adequate pocket of fluid in the left chest. The area was then prepped and draped in the normal sterile fashion. 1% Lidocaine was used for local anesthesia. Under ultrasound guidance a 6 Fr Safe-T-Centesis catheter was introduced. Thoracentesis was performed. The catheter was removed and a dressing applied. FINDINGS: A total of approximately 1.1 L of hazy amber fluid was removed. Procedure was stopped at 1.1 L per patient's request secondary to patient's symptoms (chest pain, coughing). Samples were sent to the laboratory as requested by the clinical team. IMPRESSION: Successful  ultrasound guided left thoracentesis yielding 1.1 L of pleural fluid. Read by: Earley Abide, PA-C Electronically Signed   By: Sandi Mariscal M.D.   On: 08/15/2020 11:59        Scheduled Meds: . Chlorhexidine Gluconate Cloth  6 each Topical Daily  . ferrous sulfate  325 mg Oral BID WC  . folic acid  1 mg Oral Daily  . lidocaine      . LORazepam  0.5 mg Intravenous Once  . multivitamin with minerals  1 tablet Oral Daily  . nicotine  21 mg Transdermal Daily   Continuous Infusions:   LOS: 6 days    Time spent: 35 minutes    Irine Seal, MD [facility]   To contact the attending provider between 7A-7P or the covering provider during after hours 7P-7A, please log into the web site [email or web address] and access using universal [place] password for that web site. If you do not have the password, please call the hospital operator.  08/15/2020, 12:30 PM

## 2020-08-16 ENCOUNTER — Other Ambulatory Visit: Payer: Self-pay

## 2020-08-16 DIAGNOSIS — J90 Pleural effusion, not elsewhere classified: Secondary | ICD-10-CM

## 2020-08-16 DIAGNOSIS — R000 Tachycardia, unspecified: Secondary | ICD-10-CM

## 2020-08-16 LAB — BASIC METABOLIC PANEL
Anion gap: 7 (ref 5–15)
BUN: 29 mg/dL — ABNORMAL HIGH (ref 6–20)
CO2: 25 mmol/L (ref 22–32)
Calcium: 8.5 mg/dL — ABNORMAL LOW (ref 8.9–10.3)
Chloride: 103 mmol/L (ref 98–111)
Creatinine, Ser: 2.65 mg/dL — ABNORMAL HIGH (ref 0.44–1.00)
GFR, Estimated: 20 mL/min — ABNORMAL LOW (ref >60)
Glucose, Bld: 92 mg/dL (ref 70–99)
Potassium: 4.3 mmol/L (ref 3.5–5.1)
Sodium: 135 mmol/L (ref 135–145)

## 2020-08-16 LAB — CBC
HCT: 27.7 % — ABNORMAL LOW (ref 36.0–46.0)
Hemoglobin: 9 g/dL — ABNORMAL LOW (ref 12.0–15.0)
MCH: 30.8 pg (ref 26.0–34.0)
MCHC: 32.5 g/dL (ref 30.0–36.0)
MCV: 94.9 fL (ref 80.0–100.0)
Platelets: 274 10*3/uL (ref 150–400)
RBC: 2.92 MIL/uL — ABNORMAL LOW (ref 3.87–5.11)
RDW: 16.1 % — ABNORMAL HIGH (ref 11.5–15.5)
WBC: 6.1 10*3/uL (ref 4.0–10.5)
nRBC: 0 % (ref 0.0–0.2)

## 2020-08-16 LAB — PATHOLOGIST SMEAR REVIEW

## 2020-08-16 LAB — CYTOLOGY - NON PAP

## 2020-08-16 MED ORDER — THIAMINE HCL 100 MG PO TABS: 100.0000 mg | ORAL_TABLET | Freq: Every day | ORAL | Status: DC

## 2020-08-16 MED ORDER — PANTOPRAZOLE SODIUM 40 MG PO TBEC: 40.0000 mg | DELAYED_RELEASE_TABLET | Freq: Two times a day (BID) | ORAL | 1 refills | Status: DC

## 2020-08-16 MED ORDER — HEPARIN SOD (PORK) LOCK FLUSH 100 UNIT/ML IV SOLN
500.0000 [IU] | INTRAVENOUS | Status: AC | PRN
  Administered 2020-08-16: 500 [IU]
  Filled 2020-08-16: qty 5

## 2020-08-16 MED ORDER — CARVEDILOL 3.125 MG PO TABS
3.1250 mg | ORAL_TABLET | Freq: Two times a day (BID) | ORAL | Status: DC
  Administered 2020-08-16: 3.125 mg via ORAL
  Filled 2020-08-16: qty 1

## 2020-08-16 MED ORDER — NICOTINE 21 MG/24HR TD PT24: 21.0000 mg | MEDICATED_PATCH | Freq: Every day | TRANSDERMAL | 0 refills | Status: DC

## 2020-08-16 MED ORDER — CARVEDILOL 3.125 MG PO TABS: 3.1250 mg | ORAL_TABLET | Freq: Two times a day (BID) | ORAL | 2 refills | Status: DC

## 2020-08-16 NOTE — Progress Notes (Signed)
Pt d/c to home. Port de accessed by IV team. AVS reviewed with pt. Paper prescriptions given to pt. Pt escorted off of unit to care and car of boyfriend/significant other.

## 2020-08-16 NOTE — TOC Transition Note (Signed)
Transition of Care [facility]) - CM/SW Discharge Note   Patient Details  Name: Brenda [name] MRN: [id number] Date of Birth: [birth year]  Transition of Care [facility]) CM/SW Contact:  Trish Mage, LCSW Phone Number: 08/16/2020, 10:41 AM   Clinical Narrative:   Patient who is stable for d/c today will return home with [place] through [place].  Although rolling walker is recommended by PT, unable to get one for her through insurance as she just got a rollator through them on 4/27 during previous hospital stay. Ms Kirkey will be transported home by [facility].  TOC sign off.     Final next level of care: Home w Home Health Services Barriers to Discharge: Barriers Resolved   Patient Goals and CMS Choice     Choice offered to / list presented to : [facility]  Discharge Placement                       Discharge Plan and Services   Discharge Planning Services: CM Consult Post Acute Care Choice: [place]                               Social Determinants of Health (SDOH) Interventions     Readmission Risk Interventions Readmission Risk Prevention Plan 07/17/2020 04/11/2020  Transportation Screening Complete Complete  PCP or Specialist Appt within 3-5 Days - Complete  HRI or [place] Complete Complete  Social Work Consult for [place] Planning/Counseling Complete Complete  Palliative Care Screening Not Applicable Not Applicable  Medication Review [employer] photographer) Complete Complete  Some recent data might be hidden

## 2020-08-16 NOTE — Progress Notes (Signed)
Progress Note  Patient Name: Brenda [name] Date of Encounter: 08/16/2020  [facility] Cardiologist: None   Subjective   Denies chest pain or dyspnea  Inpatient Medications    Scheduled Meds: . Chlorhexidine Gluconate Cloth  6 each Topical Daily  . ferrous sulfate  325 mg Oral BID WC  . folic acid  1 mg Oral Daily  . LORazepam  0.5 mg Intravenous Once  . multivitamin with minerals  1 tablet Oral Daily  . nicotine  21 mg Transdermal Daily   Continuous Infusions:  PRN Meds: labetalol, LORazepam, ondansetron **OR** ondansetron (ZOFRAN) IV, sodium chloride flush, traMADol   Vital Signs    Vitals:   08/15/20 2029 08/16/20 0437 08/16/20 0500 08/16/20 1000  BP: 112/80 128/83    Pulse: (!) 102 (!) 102    Resp: 20 14    Temp: 99 F (37.2 C) 99 F (37.2 C)    TempSrc: Oral Oral    SpO2: 100% 99%  100%  Weight:   56.5 kg     Intake/Output Summary (Last 24 hours) at 08/16/2020 1425 Last data filed at 08/15/2020 1948 Gross per 24 hour  Intake 827 ml  Output 1000 ml  Net -173 ml   Last 3 Weights 08/16/2020 08/15/2020 08/14/2020  Weight (lbs) 124 lb 9 oz 121 lb 14.6 oz 123 lb 0.3 oz  Weight (kg) 56.5 kg 55.3 kg 55.8 kg  Some encounter information is confidential and restricted. Go to Review Flowsheets activity to see all data.      Telemetry    Sinus tachycardia, rate 100s- Personally Reviewed  ECG    No new ECG- Personally Reviewed  Physical Exam   GEN: No acute distress.   Neck: No JVD Cardiac: RRR, no murmurs, rubs, or gallops.  Respiratory:  Diminished breath sounds at bases GI: Soft, nontender, non-distended  MS: No edema; No deformity. Neuro:  Nonfocal  Psych: Normal affect   Labs    High Sensitivity Troponin:  No results for input(s): TROPONINIHS in the last 720 hours.    Chemistry Recent Labs  Lab 08/10/20 0827 08/11/20 0847 08/12/20 0433 08/15/20 0350 08/15/20 1008 08/16/20 0232  NA 142 142   < > 135 137 135  K 3.4* 3.8   < > 3.9 3.7  4.3  CL 113* 114*   < > 106 104 103  CO2 14* 17*   < > 25 24 25   GLUCOSE 93 114*   < > 85 172* 92  BUN 19 22*   < > 31* 29* 29*  CREATININE 2.48* 2.52*   < > 2.77* 2.93* 2.65*  CALCIUM 8.3* 8.4*   < > 8.3* 8.7* 8.5*  PROT 6.7 6.7  --   --  6.9  --   ALBUMIN 2.0* 2.0*  --   --  2.3*  --   AST 16 16  --   --  24  --   ALT 10 10  --   --  13  --   ALKPHOS 120 121  --   --  128*  --   BILITOT 1.2 0.3  --   --  0.5  --   GFRNONAA 22* 22*   < > 19* 18* 20*  ANIONGAP 15 11   < > 4* 9 7   < > = values in this interval not displayed.     Hematology Recent Labs  Lab 08/14/20 0340 08/15/20 0350 08/16/20 0232  WBC 5.7 5.6 6.1  RBC 2.78* 2.79* 2.92*  HGB 8.4* 8.6* 9.0*  HCT 26.0* 26.3* 27.7*  MCV 93.5 94.3 94.9  MCH 30.2 30.8 30.8  MCHC 32.3 32.7 32.5  RDW 16.0* 15.9* 16.1*  PLT 253 308 274    BNPNo results for input(s): BNP, PROBNP in the last 168 hours.   DDimer No results for input(s): DDIMER in the last 168 hours.   Radiology    DG Chest 1 View  Result Date: 08/15/2020 CLINICAL DATA:  Status post left thoracentesis. EXAM: CHEST  1 VIEW COMPARISON:  Aug 11, 2020. FINDINGS: Left pleural effusion is smaller status post thoracentesis. No pneumothorax is noted. IMPRESSION: No pneumothorax status post left thoracentesis. Electronically Signed   By: Marijo Conception M.D.   On: 08/15/2020 11:56   US THORACENTESIS ASP PLEURAL SPACE W/IMG GUIDE  Result Date: 08/15/2020 INDICATION: Patient with history of stage IV lung cancer, dyspnea, CKD stage IV, and left pleural effusion. Request is made for diagnostic and therapeutic left thoracentesis. EXAM: ULTRASOUND GUIDED DIAGNOSTIC AND THERAPEUTIC LEFT THORACENTESIS MEDICATIONS: 10 mL 1% lidocaine COMPLICATIONS: None immediate. PROCEDURE: An ultrasound guided thoracentesis was thoroughly discussed with the patient and questions answered. The benefits, risks, alternatives and complications were also discussed. The patient understands and wishes to  proceed with the procedure. Written consent was obtained. Ultrasound was performed to localize and mark an adequate pocket of fluid in the left chest. The area was then prepped and draped in the normal sterile fashion. 1% Lidocaine was used for local anesthesia. Under ultrasound guidance a 6 Fr Safe-T-Centesis catheter was introduced. Thoracentesis was performed. The catheter was removed and a dressing applied. FINDINGS: A total of approximately 1.1 L of hazy amber fluid was removed. Procedure was stopped at 1.1 L per patient's request secondary to patient's symptoms (chest pain, coughing). Samples were sent to the laboratory as requested by the clinical team. IMPRESSION: Successful ultrasound guided left thoracentesis yielding 1.1 L of pleural fluid. Read by: Earley Abide, PA-C Electronically Signed   By: Sandi Mariscal M.D.   On: 08/15/2020 11:59    Cardiac Studies   Echo 08/13/20: 1. Left ventricular ejection fraction, by estimation, is 40 to 45%. The  left ventricle has mildly decreased function. The left ventricle  demonstrates global hypokinesis. Left ventricular diastolic parameters are  consistent with Grade I diastolic  dysfunction (impaired relaxation).  2. Right ventricular systolic function is normal. The right ventricular  size is normal.  3. Large pleural effusion in the left lateral region.  4. The mitral valve is normal in structure. No evidence of mitral valve  regurgitation. No evidence of mitral stenosis.  5. The aortic valve is normal in structure. Aortic valve regurgitation is  not visualized. No aortic stenosis is present.  6. The inferior vena cava is normal in size with greater than 50%  respiratory variability, suggesting right atrial pressure of 3 mmHg.   Patient Profile     60 y.o. female with a PMH of stage IV lung cancer s/p chemo, HIV, depression, history of breast cancer, alcohol abuse, tobacco abuse, polysubstance abuse (cocaine), and recent hospitalization  for sepsis who is being seen 08/14/2020 for the evaluation of acute combined HF at the request of Dr. Starla Link.  Assessment & Plan    Acute combined CHF: found on echo this admission to have EF 40-45%. Unclear acuity of this as she has not had prior imaging. GDMT limited by soft blood pressures, CKD, cocaine use, and underlying pulmonary disease. Etiology remains unclear though differential includes ischemic etiology, chemotherapy induced,  or substance abuse (ETOH/cocaine). She is not a candidate for ischemic testing with her anemia requiring transfusions and CKD.  - Add coreg 3.125 mg BID - Hold off on ACE/ARB/ARNI given renal function  Polysubstance abuse: recently reports quitting tobacco use but continues to drink alcohol and use cocaine. Abstinence strongly encouraged as these could be contributing to #1.  - Continue to encourage cessation  Remainder of care per primary team: - Stage IV lung cancer - Metabolic encephalopathy - HIV - CKD stage IV - Failure to thrive  [facility] will sign off.   Medication Recommendations:  Coreg 3.125 mg BID Other recommendations (labs, testing, etc):  None Follow up as an outpatient:  Will schedule   For questions or updates, please contact [place] Please consult [email or web address] for contact info under        Signed, Donato Heinz, MD  08/16/2020, 2:25 PM

## 2020-08-16 NOTE — Discharge Summary (Signed)
Physician Discharge Summary  Brenda [name] ZJQ:[id number] DOB: [birth year] DOA: 08/09/2020  PCP: Pcp, No  Admit date: 08/09/2020 Discharge date: 08/16/2020  Time spent: 55 minutes  Recommendations for Outpatient Follow-up:  1. Follow-up with Dr. Lorna Few, oncology in 1 to 2 weeks.  On follow-up cytology from thoracentesis will need to be followed up upon. 2. Follow-up with Dr.Schumann, cardiology for follow-up on newly diagnosed CHF.  Office will call with appointment time. 3. Follow-up at [facility] 09/20/2020.  On follow-up patient will need a basic metabolic profile, magnesium done to follow-up on electrolytes and renal function.   Discharge Diagnoses:  Principal Problem:   Hypokalemia Active Problems:   Alcohol abuse   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 (HCC)   Malignant neoplasm of right lung (HCC)   Acute on chronic anemia   Acute encephalopathy   Pancytopenia (HCC)   Polysubstance abuse (HCC)   AKI (acute kidney injury) (HCC)   Pleural effusion   S/P thoracentesis   Hypomagnesemia   Acute on chronic combined systolic and diastolic congestive heart failure (HCC)   Exudative pleural effusion   Sinus tachycardia   Discharge Condition: Stable and improved  Diet recommendation: Heart healthy  Filed Weights   08/14/20 0431 08/15/20 0418 08/16/20 0500  Weight: 55.8 kg 55.3 kg 56.5 kg    History of present illness:  HPI per Dr. Murlean Caller is a 60 y.o. female with medical history significant of stage IV lung cancer status post 12 cycles of pemetrexed and pembrolizumab, HIV disease, depression, history of breast cancer, alcohol abuse, polysubstance abuse, tobacco abuse who presents to the ER with confusion.  She recently had some seizure-like disorder.  Patient was seen in the hospital.  Was supposed to go to nursing home but she refused.  Patient had pancytopenia at the time with administration of dexamethasone, Granix and PRBC with platelet transfusion.  Got  better.  Returned today with confusion.  Was noted to have significant hypokalemia with potassium 2.9.  Patient reported poor oral intake and some nausea with vomiting.  No fever no chills.  Has notably not been compliant with her visit.  She was found sitting in her urine at the friend's place.  She is unable to give adequate history at this point.  Patient is being admitted with an hypokalemia in the setting of chronic medical problems..  ED Course: Temperature 98.8, blood pressure 162/103 pulse 126 respiratory 33 O2 sat 97% on room air.  Sodium 136 potassium 3.9 chloride 107 CO2 is 19.  Glucose 95.  BUN is 17 creatinine 3.51 and calcium 8.0.  CT of 19.  White count is 6.3 hemoglobin 7.7 and platelets 204.  COVID-19 screen is negative.  Analysis essentially negative urine drug screen is positive for cocaine.  Head CT without contrast is negative.  Hospital Course:  1 acute metabolic encephalopathy -Questionable etiology. -CT head negative for any acute abnormalities.  MRI brain negative for any acute abnormalities. -UDS was positive for cocaine. -EEG negative for seizures. -Ammonia, TSH, folate, vitamin B12 levels within normal limits. -Patient improved clinically and was at baseline by day of discharge.  -May need outpatient follow-up with neurology. -PT recommending initially recommended SNF however patient refusing SNF and will go home with home health therapies.  2.  Hypokalemia/hypomagnesemia -Repleted.  3.  Stage IV lung cancer -Patient followed by oncology during the hospitalization. -Patient seen by palliative care and wants to remain full code. -Outpatient follow-up with oncology.  4.  Polysubstance abuse/history of alcohol abuse/tobacco abuse -  Unsure about last cocaine use however UDS was positive for cocaine on admission. -Patient received thiamine 500 mg daily x3 days, placed on thiamine 250 mg daily x3 days and then thiamine 100 mg daily and on discharge. -Polysubstance  cessation stressed to patient.  5.  Chronic kidney disease stage IV -Remained close to baseline.   -Outpatient follow-up.   6.  Left exudative pleural effusion -Noted on chest x-ray and on 2D echo. -Ultrasound guided therapeutic and diagnostic thoracentesis done 08/15/2020 without any complications with 1.1 L of hazy amber fluid removed.  Cultures negative.  -By lights criteria consistent with an exudative pleural effusion likely secondary to patient's malignancy.  Cytology pending at time of discharge.  -Patient improved clinically.  -Outpatient follow-up with oncology.   7.  Persistent sinus tachycardia -2D echo with a EF of 40 to 47%, grade 1 diastolic dysfunction. -.  Heart rate improved in the low 100s. -Patient seen by cardiology and patient started on Coreg 3.125 mg twice daily.  -Outpatient follow-up.   8.  New diagnosis of combined CHF -2D echo done with EF of 40 to 09%, grade 1 diastolic dysfunction. -Patient received couple of doses of IV Lasix with good urine output.  -Patient seen in consultation back cardiology who do not feel patient is a candidate for any ischemic work-up at this time and recommended initiation of Coreg 3.125 mg twice daily which patient was started on prior to discharge.   -No ACE/ARB/AR I recommended due to renal function.  -Polysubstance cessation stressed to patient.  -Outpatient follow-up with cardiology.    9.  Anemia of chronic disease -Status post transfusion of 1 unit packed red blood cells. -H&H stable at 9.0 by day of discharge. -Outpatient follow-up.  10.  Metabolic acidosis -Could have been secondary to chronic kidney disease stage IV. -Was on bicarb tablets with resolution of metabolic acidosis.   -Outpatient follow-up.    11.  General deconditioning PT/OT recommended SNF however patient refusing SNF and as such patient will be discharged home with home health therapies.   -    Procedures:  Chest x-ray 08/09/2020,  08/11/2020 08/13/2020, 08/15/2020,  Abdominal films 08/13/2020  MRI brain 08/10/2020  2 d echo 08/13/2020  CT head 08/09/2020  Ultrasound-guided thoracentesis 08/15/2020--1.1 L of pleural fluid removed per Dr. Pascal Lux  EEG   Consultations:  Cardiology: Dr.Schumann 08/14/2020  Palliative care: Dr. Domingo Cocking 08/11/2020   Discharge Exam: Vitals:   08/16/20 0437 08/16/20 1000  BP: 128/83   Pulse: (!) 102   Resp: 14   Temp: 99 F (37.2 C)   SpO2: 99% 100%    General: NAD Cardiovascular: RRR Respiratory: CTAB  Discharge Instructions   Discharge Instructions    Diet - low sodium heart healthy   Complete by: As directed    Increase activity slowly   Complete by: As directed      Allergies as of 08/16/2020      Reactions   Aspirin Adult Low [aspirin] Other (See Comments)   Stomach upset      Medication List    TAKE these medications   acetaminophen 325 MG tablet Commonly known as: TYLENOL Take 2 tablets (650 mg total) by mouth every 6 (six) hours as needed for mild pain, moderate pain or headache.   carvedilol 3.125 MG tablet Commonly known as: COREG Take 1 tablet (3.125 mg total) by mouth 2 (two) times daily with a meal.   FeroSul 325 (65 FE) MG tablet Generic drug: ferrous sulfate TAKE 1 TABLET (  325 MG TOTAL) BY MOUTH 2 (TWO) TIMES DAILY WITH A MEAL. What changed:   how much to take  how to take this  when to take this   folic acid 1 MG tablet Commonly known as: FOLVITE TAKE 1 TABLET (1 MG TOTAL) BY MOUTH DAILY. What changed: how much to take   gabapentin 300 MG capsule Commonly known as: NEURONTIN Take 1 capsule (300 mg total) by mouth at bedtime.   lidocaine-prilocaine cream Commonly known as: EMLA Apply 1 application topically as needed. What changed: reasons to take this   nicotine 21 mg/24hr patch Commonly known as: NICODERM CQ - dosed in mg/24 hours Place 1 patch (21 mg total) onto the skin daily. Start taking on: Aug 17, 2020    pantoprazole 40 MG tablet Commonly known as: PROTONIX Take 1 tablet (40 mg total) by mouth 2 (two) times daily.   polyethylene glycol 17 g packet Commonly known as: MIRALAX / GLYCOLAX Dissolve 1 packet (17 g) in liquid and drink 2 (two) times daily.   thiamine 100 MG tablet Take 1 tablet (100 mg total) by mouth daily.            Durable Medical Equipment  (From admission, onward)         Start     Ordered   08/15/20 1621  For home use only DME Walker rolling  Once       Question Answer Comment  Walker: With 5 Inch Wheels   Patient needs a walker to treat with the following condition Debility      08/15/20 1620         Allergies  Allergen Reactions  . Aspirin Adult Low [Aspirin] Other (See Comments)    Stomach upset    Follow-up Information    [facility] Follow up on 09/20/2020.   Specialty: Family Medicine Why: Friday at 8:30 for your hospital follow up appointment.  I put you on a cancellation list. They will call if there is a sooner appointment. Contact information: [place] [id number] [phone]       Donato Heinz, MD Follow up.   Specialties: Cardiology, Radiology Why: Office will call with appointment time. Contact information: [address] [place] [address] [phone]        Curt Bears, MD. Schedule an appointment as soon as possible for a visit in 1 week(s).   Specialty: Oncology Why: Follow-up in 1 to 2 weeks. Contact information: [place] [address] [phone]                The results of significant diagnostics from this hospitalization (including imaging, microbiology, ancillary and laboratory) are listed below for reference.    Significant Diagnostic Studies: DG Chest 1 View  Result Date: 08/15/2020 CLINICAL DATA:  Status post left thoracentesis. EXAM: CHEST  1 VIEW COMPARISON:  Aug 11, 2020.  FINDINGS: Left pleural effusion is smaller status post thoracentesis. No pneumothorax is noted. IMPRESSION: No pneumothorax status post left thoracentesis. Electronically Signed   By: Marijo Conception M.D.   On: 08/15/2020 11:56   DG Chest 2 View  Result Date: 08/11/2020 CLINICAL DATA:  Shortness of breath EXAM: CHEST - 2 VIEW COMPARISON:  08/09/2020 FINDINGS: Cardiomegaly. Large left pleural effusion. Small right pleural effusion. Bibasilar atelectasis. Right Port-A-Cath remains in place, unchanged. IMPRESSION: Bilateral effusions, left greater than right with bibasilar atelectasis. Cardiomegaly. Electronically Signed   By: Rolm Baptise  M.D.   On: 08/11/2020 14:21   CT Head Wo Contrast  Result Date: 08/09/2020 CLINICAL DATA:  Mental status change EXAM: CT HEAD WITHOUT CONTRAST TECHNIQUE: Contiguous axial images were obtained from the base of the skull through the vertex without intravenous contrast. COMPARISON:  07/01/2020 FINDINGS: Brain: Stable atrophy and white matter microvascular changes throughout both cerebral hemispheres. No acute intracranial hemorrhage, new mass lesion, infarction, midline shift, herniation, hydrocephalus, or extra-axial fluid collection. No focal mass effect or edema. Cisterns are patent. Cerebellar atrophy as well. Vascular: No hyperdense vessel or unexpected calcification. Skull: Normal. Negative for fracture or focal lesion. Sinuses/Orbits: No acute finding. Other: None. IMPRESSION: Stable atrophy and white matter microvascular ischemic changes. No interval change or acute process by noncontrast CT. Electronically Signed   By: Jerilynn Mages.  Shick M.D.   On: 08/09/2020 15:55   MR BRAIN WO CONTRAST  Result Date: 08/10/2020 CLINICAL DATA:  Mental status change.  Mental status change EXAM: MRI HEAD WITHOUT CONTRAST TECHNIQUE: Multiplanar, multiecho pulse sequences of the brain and surrounding structures were obtained without intravenous contrast. COMPARISON:  MRI head 04/08/2020. CT head  08/09/2020 CT head 08/09/2020 FINDINGS: Brain: Image quality degraded by motion. Rapid scanning performed due to motion. Moderate atrophy. Negative for hydrocephalus. 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 Minimal white matter changes in the cerebral hemispheres. Negative for acute infarct, hemorrhage, mass. Vascular: Normal arterial flow voids at the skull base. Skull and upper cervical spine: No focal lesion. Sinuses/Orbits: Mucosal edema paranasal sinuses. Large retention cyst left maxillary sinus. Negative orbit Other: None IMPRESSION: Negative for acute abnormality Atrophy and chronic ischemic change most notably in the central pons. Electronically Signed   By: Franchot Gallo M.D.   On: 08/10/2020 13:13   DG Chest Port 1 View  Result Date: 08/09/2020 CLINICAL DATA:  Shortness of breath. EXAM: PORTABLE CHEST 1 VIEW COMPARISON:  07/07/2020 FINDINGS: There is a right chest wall port a catheter with tip in the distal SVC. Stable cardiomediastinal contours. Moderate left pleural effusion and small right pleural effusion are again noted. Mild interstitial edema noted. No airspace consolidation. IMPRESSION: 1. Persistent bilateral pleural effusions, left greater right. 2. Mild interstitial edema. Electronically Signed   By: Kerby Moors M.D.   On: 08/09/2020 15:07   DG Abd 2 Views  Result Date: 08/13/2020 CLINICAL DATA:  Altered mental status. Abdominal distension, weakness and shortness of breath. EXAM: ABDOMEN - 2 VIEW COMPARISON:  CT chest, abdomen and pelvis 05/21/2020. FINDINGS: There is moderate gaseous distention of stomach. No small bowel distention is identified. Gas and stool are seen scattered through the colon. Small gallstones are noted. No free intraperitoneal air. Left pleural effusion and basilar airspace disease are seen. IMPRESSION: Moderate gaseous distention of the stomach. Negative for free intraperitoneal air or bowel obstruction. Left pleural effusion and basilar  airspace disease. Gallstones as seen on prior CT. Electronically Signed   By: Inge Rise M.D.   On: 08/13/2020 14:27   EEG adult  Result Date: 08/12/2020 Lora Havens, MD     08/12/2020  3:10 PM Patient Name: Alfie Alderfer MRN: [id number] Epilepsy Attending: Lora Havens Referring Physician/Provider: Dr. Aline August Date: 08/12/2020 Duration: 23.57 mins Patient history: 60 year old female with altered mental status.  EEG to evaluate for seizures. Level of alertness: Awake AEDs during EEG study: None Technical aspects: This EEG study was done with scalp electrodes positioned according to the 10-20 International system of electrode placement. Electrical activity was acquired at a sampling rate of  500Hz  and reviewed with a high frequency filter of 70Hz  and a low frequency filter of 1Hz . EEG data were recorded continuously and digitally stored. Description: The posterior dominant rhythm consists of 9 Hz activity of moderate voltage (25-35 uV) seen predominantly in posterior head regions, symmetric and reactive to eye opening and eye closing. Hyperventilation and photic stimulation were not performed.   IMPRESSION: This study is within normal limits. No seizures or epileptiform discharges were seen throughout the recording. Lora Havens   ECHOCARDIOGRAM COMPLETE  Result Date: 08/13/2020    ECHOCARDIOGRAM REPORT   Patient Name:   LAVENDER STANKE Date of Exam: 08/13/2020 Medical Rec #:  [id number]      Height:       66.0 in Accession #:    [id number]     Weight:       114.6 lb Date of Birth:  1960-07-10      BSA:          1.579 m Patient Age:    38 years       BP:           103/72 mmHg Patient Gender: F              HR:           106 bpm. Exam Location:  Inpatient Procedure: 2D Echo, Cardiac Doppler and Color Doppler Indications:    Dyspnea  History:        Patient has no prior history of Echocardiogram examinations.  Sonographer:    Luisa Hart RDCS Referring Phys: [id number] [facility]   Sonographer Comments: Patient is morbidly obese. Image acquisition challenging due to patient body habitus. IMPRESSIONS  1. Left ventricular ejection fraction, by estimation, is 40 to 45%. The left ventricle has mildly decreased function. The left ventricle demonstrates global hypokinesis. Left ventricular diastolic parameters are consistent with Grade I diastolic dysfunction (impaired relaxation).  2. Right ventricular systolic function is normal. The right ventricular size is normal.  3. Large pleural effusion in the left lateral region.  4. The mitral valve is normal in structure. No evidence of mitral valve regurgitation. No evidence of mitral stenosis.  5. The aortic valve is normal in structure. Aortic valve regurgitation is not visualized. No aortic stenosis is present.  6. The inferior vena cava is normal in size with greater than 50% respiratory variability, suggesting right atrial pressure of 3 mmHg. FINDINGS  Left Ventricle: Left ventricular ejection fraction, by estimation, is 40 to 45%. The left ventricle has mildly decreased function. The left ventricle demonstrates global hypokinesis. The left ventricular internal cavity size was normal in size. There is  no left ventricular hypertrophy. Left ventricular diastolic parameters are consistent with Grade I diastolic dysfunction (impaired relaxation). Right Ventricle: The right ventricular size is normal. No increase in right ventricular wall thickness. Right ventricular systolic function is normal. Left Atrium: Left atrial size was normal in size. Right Atrium: Right atrial size was normal in size. Pericardium: There is no evidence of pericardial effusion. Mitral Valve: The mitral valve is normal in structure. No evidence of mitral valve regurgitation. No evidence of mitral valve stenosis. MV peak gradient, 5.4 mmHg. The mean mitral valve gradient is 2.0 mmHg. Tricuspid Valve: The tricuspid valve is normal in structure. Tricuspid valve regurgitation is  not demonstrated. No evidence of tricuspid stenosis. Aortic Valve: The aortic valve is normal in structure. Aortic valve regurgitation is not visualized. No aortic stenosis is present. Aortic valve mean gradient measures 3.0 mmHg. Aortic  valve peak gradient measures 5.3 mmHg. Aortic valve area, by VTI measures 2.77 cm. Pulmonic Valve: The pulmonic valve was normal in structure. Pulmonic valve regurgitation is not visualized. No evidence of pulmonic stenosis. Aorta: The aortic root is normal in size and structure. Venous: The inferior vena cava is normal in size with greater than 50% respiratory variability, suggesting right atrial pressure of 3 mmHg. IAS/Shunts: No atrial level shunt detected by color flow Doppler. Additional Comments: There is a large pleural effusion in the left lateral region.  LEFT VENTRICLE PLAX 2D LVIDd:         4.80 cm     Diastology LVIDs:         3.60 cm     LV e' medial:    5.44 cm/s LV PW:         0.80 cm     LV E/e' medial:  10.1 LV IVS:        0.90 cm     LV e' lateral:   5.33 cm/s LVOT diam:     2.20 cm     LV E/e' lateral: 10.3 LV SV:         49 LV SV Index:   31 LVOT Area:     3.80 cm  LV Volumes (MOD) LV vol d, MOD A2C: 49.4 ml LV vol d, MOD A4C: 52.0 ml LV vol s, MOD A2C: 35.6 ml LV vol s, MOD A4C: 31.6 ml LV SV MOD A2C:     13.8 ml LV SV MOD A4C:     52.0 ml LV SV MOD BP:      17.5 ml RIGHT VENTRICLE RV Basal diam:  3.20 cm RV Mid diam:    2.50 cm RV S prime:     10.00 cm/s TAPSE (M-mode): 1.8 cm LEFT ATRIUM             Index LA Vol (A2C):   51.3 ml 32.50 ml/m LA Vol (A4C):   34.1 ml 21.60 ml/m LA Biplane Vol: 42.6 ml 26.99 ml/m  AORTIC VALVE                   PULMONIC VALVE AV Area (Vmax):    2.96 cm    PV Vmax:       0.77 m/s AV Area (Vmean):   2.79 cm    PV Vmean:      54.300 cm/s AV Area (VTI):     2.77 cm    PV VTI:        0.135 m AV Vmax:           115.00 cm/s PV Peak grad:  2.4 mmHg AV Vmean:          79.100 cm/s PV Mean grad:  1.0 mmHg AV VTI:            0.177 m AV  Peak Grad:      5.3 mmHg AV Mean Grad:      3.0 mmHg LVOT Vmax:         89.40 cm/s LVOT Vmean:        58.100 cm/s LVOT VTI:          0.129 m LVOT/AV VTI ratio: 0.73  AORTA Ao Root diam: 3.30 cm Ao Asc diam:  3.00 cm MITRAL VALVE MV Area (PHT): 5.23 cm     SHUNTS MV Area VTI:   2.07 cm     Systemic VTI:  0.13 m MV Peak grad:  5.4 mmHg  Systemic Diam: 2.20 cm MV Mean grad:  2.0 mmHg MV Vmax:       1.16 m/s MV Vmean:      69.1 cm/s MV Decel Time: 145 msec MV E velocity: 54.70 cm/s MV A velocity: 101.00 cm/s MV E/A ratio:  0.54 Candee Furbish MD Electronically signed by Candee Furbish MD Signature Date/Time: 08/13/2020/4:09:06 PM    Final    VAS [place] UPPER EXTREMITY VENOUS DUPLEX  Result Date: 07/21/2020 UPPER VENOUS STUDY  Patient Name:  WILMETTA SPEISER  Date of Exam:   07/21/2020 Medical Rec #: [id number]       Accession #:    [id number] Date of Birth: [birth year]       Patient Gender: F Patient Age:   59Y Exam Location:  [facility] Procedure:      VAS [place] UPPER EXTREMITY VENOUS DUPLEX Referring Phys: [id number] [place] --------------------------------------------------------------------------------  Indications: Edema Comparison Study: no prior Performing Technologist: Abram Sander RVS  Examination Guidelines: A complete evaluation includes B-mode imaging, spectral Doppler, color Doppler, and power Doppler as needed of all accessible portions of each vessel. Bilateral testing is considered an integral part of a complete examination. Limited examinations for reoccurring indications may be performed as noted.  Right Findings: +----------+------------+---------+-----------+----------+-------+ RIGHT     CompressiblePhasicitySpontaneousPropertiesSummary +----------+------------+---------+-----------+----------+-------+ IJV           Full       Yes       Yes                      +----------+------------+---------+-----------+----------+-------+ Subclavian    Full       Yes       Yes                       +----------+------------+---------+-----------+----------+-------+ Axillary      Full       Yes       Yes                      +----------+------------+---------+-----------+----------+-------+ Brachial      Full       Yes       Yes                      +----------+------------+---------+-----------+----------+-------+ Radial        Full                                          +----------+------------+---------+-----------+----------+-------+ Ulnar         Full                                          +----------+------------+---------+-----------+----------+-------+ Cephalic      Full                                          +----------+------------+---------+-----------+----------+-------+ Basilic       Full                                          +----------+------------+---------+-----------+----------+-------+  Left Findings: +----------+------------+---------+-----------+----------+-----------------+ LEFT      CompressiblePhasicitySpontaneousProperties     Summary      +----------+------------+---------+-----------+----------+-----------------+ IJV           Full       Yes       Yes                                +----------+------------+---------+-----------+----------+-----------------+ Subclavian    Full       Yes       Yes                                +----------+------------+---------+-----------+----------+-----------------+ Axillary      Full       Yes       Yes                                +----------+------------+---------+-----------+----------+-----------------+ Brachial      Full       Yes       Yes                                +----------+------------+---------+-----------+----------+-----------------+ Radial        Full                                                    +----------+------------+---------+-----------+----------+-----------------+ Ulnar         Full                                                     +----------+------------+---------+-----------+----------+-----------------+ Cephalic      None                                  Age Indeterminate +----------+------------+---------+-----------+----------+-----------------+ Basilic       Full                                                    +----------+------------+---------+-----------+----------+-----------------+  Summary:  Right: No evidence of deep vein thrombosis in the upper extremity. No evidence of superficial vein thrombosis in the upper extremity.  Left: Findings consistent with age indeterminate superficial vein thrombosis involving the left cephalic vein.  *See table(s) above for measurements and observations.  Diagnosing physician: Harold Barban MD Electronically signed by Harold Barban MD on 07/21/2020 at 4:26:43 PM.    Final    US THORACENTESIS ASP PLEURAL SPACE W/IMG GUIDE  Result Date: 08/15/2020 INDICATION: Patient with history of stage IV lung cancer, dyspnea, CKD stage IV, and left pleural effusion. Request is made for diagnostic and therapeutic left thoracentesis. EXAM: ULTRASOUND GUIDED DIAGNOSTIC AND THERAPEUTIC LEFT THORACENTESIS MEDICATIONS: 10 mL 1% lidocaine COMPLICATIONS: None immediate. PROCEDURE: An ultrasound guided thoracentesis was thoroughly discussed with the patient and questions  answered. The benefits, risks, alternatives and complications were also discussed. The patient understands and wishes to proceed with the procedure. Written consent was obtained. Ultrasound was performed to localize and mark an adequate pocket of fluid in the left chest. The area was then prepped and draped in the normal sterile fashion. 1% Lidocaine was used for local anesthesia. Under ultrasound guidance a 6 Fr Safe-T-Centesis catheter was introduced. Thoracentesis was performed. The catheter was removed and a dressing applied. FINDINGS: A total of approximately 1.1 L of hazy amber fluid was removed.  Procedure was stopped at 1.1 L per patient's request secondary to patient's symptoms (chest pain, coughing). Samples were sent to the laboratory as requested by the clinical team. IMPRESSION: Successful ultrasound guided left thoracentesis yielding 1.1 L of pleural fluid. Read by: Earley Abide, PA-C Electronically Signed   By: Sandi Mariscal M.D.   On: 08/15/2020 11:59    Microbiology: Recent Results (from the past 240 hour(s))  Resp Panel by RT-PCR (Flu A&B, Covid) Nasopharyngeal Swab     Status: None   Collection Time: 08/09/20  3:08 PM   Specimen: Nasopharyngeal Swab; Nasopharyngeal(NP) swabs in vial transport medium  Result Value Ref Range Status   SARS Coronavirus 2 by RT PCR NEGATIVE NEGATIVE Final    Comment: (NOTE) SARS-CoV-2 target nucleic acids are NOT DETECTED.  The SARS-CoV-2 RNA is generally detectable in upper respiratory specimens during the acute phase of infection. The lowest concentration of SARS-CoV-2 viral copies this assay can detect is 138 copies/mL. A negative result does not preclude SARS-Cov-2 infection and should not be used as the sole basis for treatment or other patient management decisions. A negative result may occur with  improper specimen collection/handling, submission of specimen other than nasopharyngeal swab, presence of viral mutation(s) within the areas targeted by this assay, and inadequate number of viral copies(<138 copies/mL). A negative result must be combined with clinical observations, patient history, and epidemiological information. The expected result is Negative.  Fact Sheet for Patients:  EntrepreneurPulse.com.au  Fact Sheet for Healthcare Providers:  IncredibleEmployment.be  This test is no t yet approved or cleared by the [place] FDA and  has been authorized for detection and/or diagnosis of SARS-CoV-2 by FDA under an Emergency Use Authorization (EUA). This EUA will remain  in effect  (meaning this test can be used) for the duration of the COVID-19 declaration under Section 564(b)(1) of the Act, 21 U.S.C.section 360bbb-3(b)(1), unless the authorization is terminated  or revoked sooner.       Influenza A by PCR NEGATIVE NEGATIVE Final   Influenza B by PCR NEGATIVE NEGATIVE Final    Comment: (NOTE) The Xpert Xpress SARS-CoV-2/FLU/RSV plus assay is intended as an aid in the diagnosis of influenza from Nasopharyngeal swab specimens and should not be used as a sole basis for treatment. Nasal washings and aspirates are unacceptable for Xpert Xpress SARS-CoV-2/FLU/RSV testing.  Fact Sheet for Patients: EntrepreneurPulse.com.au  Fact Sheet for Healthcare Providers: IncredibleEmployment.be  This test is not yet approved or cleared by the [place] FDA and has been authorized for detection and/or diagnosis of SARS-CoV-2 by FDA under an Emergency Use Authorization (EUA). This EUA will remain in effect (meaning this test can be used) for the duration of the COVID-19 declaration under Section 564(b)(1) of the Act, 21 U.S.C. section 360bbb-3(b)(1), unless the authorization is terminated or revoked.  Performed at [facility], [place] [address], [place], [place] [address]   Urine Culture     Status: None  Collection Time: 08/09/20  4:28 PM   Specimen: Urine, Random  Result Value Ref Range Status   Specimen Description   Final    URINE, RANDOM Performed at [facility] [address] [place], [place] [address]    Special Requests   Final    NONE Performed at [facility], [place] [address] [place], [place] [address]    Culture   Final    NO GROWTH Performed at [facility], [place] [address] [place], [place] [address]    Report Status 08/11/2020 FINAL  Final  Body fluid culture w Gram Stain     Status: None (Preliminary result)   Collection Time: 08/15/20 11:36 AM    Specimen: Lung, Left; Pleural Fluid  Result Value Ref Range Status   Specimen Description   Final    Lung, Left Performed at [facility], [place] [address] [place], [place] [address]    Special Requests   Final    NONE Performed at [facility], [place] [address], [place], [place] [address]    Gram Stain NO WBC SEEN NO ORGANISMS SEEN   Final   Culture   Final    NO GROWTH < 24 HOURS Performed at [facility], [place] [address], [place], [place] [address]    Report Status PENDING  Incomplete     Labs: Basic Metabolic Panel: Recent Labs  Lab 08/11/20 0847 08/12/20 0433 08/13/20 0525 08/14/20 0340 08/15/20 0350 08/15/20 1008 08/16/20 0232  NA 142 145 137 137 135 137 135  K 3.8 3.6 3.7 4.0 3.9 3.7 4.3  CL 114* 113* 106 105 106 104 103  CO2 17* 25 24 25 25 24 25   GLUCOSE 114* 107* 84 83 85 172* 92  BUN 22* 27* 30* 32* 31* 29* 29*  CREATININE 2.52* 2.59* 2.80* 2.82* 2.77* 2.93* 2.65*  CALCIUM 8.4* 8.6* 8.3* 8.4* 8.3* 8.7* 8.5*  MG 1.5* 1.8 1.6* 2.2 2.1  --   --    Liver Function Tests: Recent Labs  Lab 08/10/20 0827 08/11/20 0847 08/15/20 1008  AST 16 16 24   ALT 10 10 13   ALKPHOS 120 121 128*  BILITOT 1.2 0.3 0.5  PROT 6.7 6.7 6.9  ALBUMIN 2.0* 2.0* 2.3*   No results for input(s): LIPASE, AMYLASE in the last 168 hours. Recent Labs  Lab 08/12/20 0433  AMMONIA 16   CBC: Recent Labs  Lab 08/11/20 0847 08/12/20 0433 08/13/20 0525 08/14/20 0340 08/15/20 0350 08/16/20 0232  WBC 7.0 5.0 6.0 5.7 5.6 6.1  NEUTROABS 4.0 2.6 2.5 2.3 2.3  --   HGB 8.2* 7.0* 8.5* 8.4* 8.6* 9.0*  HCT 25.6* 22.0* 25.8* 26.0* 26.3* 27.7*  MCV 90.8 92.8 92.1 93.5 94.3 94.9  PLT 305 252 238 253 308 274   Cardiac Enzymes: No results for input(s): CKTOTAL, CKMB, CKMBINDEX, TROPONINI in the last 168 hours. BNP: BNP (last 3 results) No results for input(s): BNP in the last 8760 hours.  ProBNP (last 3 results) No results for input(s):  PROBNP in the last 8760 hours.  CBG: No results for input(s): GLUCAP in the last 168 hours.     Signed:  Irine Seal MD.  [facility] 08/16/2020, 3:13 PM

## 2020-08-18 LAB — BODY FLUID CULTURE W GRAM STAIN
Culture: NO GROWTH
Gram Stain: NONE SEEN

## 2020-08-21 ENCOUNTER — Inpatient Hospital Stay: Payer: Medicaid Other | Attending: Internal Medicine | Admitting: Internal Medicine

## 2020-08-21 ENCOUNTER — Other Ambulatory Visit: Payer: Medicaid Other

## 2020-08-21 ENCOUNTER — Telehealth: Payer: Self-pay | Admitting: Medical Oncology

## 2020-08-21 ENCOUNTER — Inpatient Hospital Stay: Payer: Medicaid Other

## 2020-08-21 NOTE — Telephone Encounter (Signed)
LVM re: missed appt and to please return my call.

## 2020-08-27 ENCOUNTER — Telehealth: Payer: Self-pay | Admitting: Cardiology

## 2020-08-27 NOTE — Telephone Encounter (Signed)
Kathyrn Drown D, NP  P Cv Div Nl Scheduling; P Cv Div Nl Triage PLease schedule this patient for a 1-2 month follow up with Dr. Gardiner Rhyme or APP. PLease call the patient with date and time of appointment   Thank you  Sharee Pimple        Comments  6/6- LVM to sch appt -sas   08/16/20 LVMTCB to schedule - anc   08/20/20 9:33am called to schedule 1-2 month f/u with Dr. Gardiner Rhyme or APP per staff message, pt states she will call back - LCN     08/22/20 8:46am called to schedule 1-2 month f/u with Dr. Gardiner Rhyme or APP per staff message, pt states she will call back - LCN

## 2020-09-20 ENCOUNTER — Inpatient Hospital Stay (INDEPENDENT_AMBULATORY_CARE_PROVIDER_SITE_OTHER): Payer: Medicaid Other | Admitting: Primary Care

## 2020-09-20 ENCOUNTER — Encounter: Payer: Self-pay | Admitting: Physician Assistant

## 2021-01-25 ENCOUNTER — Other Ambulatory Visit: Payer: Self-pay | Admitting: Nurse Practitioner

## 2021-03-11 ENCOUNTER — Encounter (HOSPITAL_COMMUNITY): Payer: Self-pay | Admitting: Emergency Medicine

## 2021-03-11 ENCOUNTER — Other Ambulatory Visit: Payer: Self-pay

## 2021-03-11 ENCOUNTER — Inpatient Hospital Stay (HOSPITAL_COMMUNITY): Payer: Medicaid Other

## 2021-03-11 ENCOUNTER — Emergency Department (HOSPITAL_COMMUNITY): Payer: Medicaid Other

## 2021-03-11 ENCOUNTER — Inpatient Hospital Stay (HOSPITAL_COMMUNITY)
Admission: EM | Admit: 2021-03-11 | Discharge: 2021-04-05 | DRG: 054 | Disposition: A | Discharge status: Home Health | Payer: Medicaid Other | Attending: Internal Medicine | Admitting: Internal Medicine

## 2021-03-11 DIAGNOSIS — R627 Adult failure to thrive: Secondary | ICD-10-CM | POA: Diagnosis not present

## 2021-03-11 DIAGNOSIS — D630 Anemia in neoplastic disease: Secondary | ICD-10-CM | POA: Diagnosis present

## 2021-03-11 DIAGNOSIS — Z9221 Personal history of antineoplastic chemotherapy: Secondary | ICD-10-CM

## 2021-03-11 DIAGNOSIS — R296 Repeated falls: Secondary | ICD-10-CM | POA: Diagnosis present

## 2021-03-11 DIAGNOSIS — D631 Anemia in chronic kidney disease: Secondary | ICD-10-CM | POA: Diagnosis present

## 2021-03-11 DIAGNOSIS — C3490 Malignant neoplasm of unspecified part of unspecified bronchus or lung: Secondary | ICD-10-CM | POA: Diagnosis present

## 2021-03-11 DIAGNOSIS — Z7189 Other specified counseling: Secondary | ICD-10-CM | POA: Diagnosis not present

## 2021-03-11 DIAGNOSIS — E8809 Other disorders of plasma-protein metabolism, not elsewhere classified: Secondary | ICD-10-CM | POA: Diagnosis present

## 2021-03-11 DIAGNOSIS — Z21 Asymptomatic human immunodeficiency virus [HIV] infection status: Secondary | ICD-10-CM | POA: Diagnosis present

## 2021-03-11 DIAGNOSIS — Z20822 Contact with and (suspected) exposure to covid-19: Secondary | ICD-10-CM | POA: Diagnosis present

## 2021-03-11 DIAGNOSIS — F1010 Alcohol abuse, uncomplicated: Secondary | ICD-10-CM | POA: Diagnosis present

## 2021-03-11 DIAGNOSIS — C3491 Malignant neoplasm of unspecified part of right bronchus or lung: Secondary | ICD-10-CM | POA: Diagnosis present

## 2021-03-11 DIAGNOSIS — J90 Pleural effusion, not elsewhere classified: Secondary | ICD-10-CM

## 2021-03-11 DIAGNOSIS — Z8249 Family history of ischemic heart disease and other diseases of the circulatory system: Secondary | ICD-10-CM

## 2021-03-11 DIAGNOSIS — Z809 Family history of malignant neoplasm, unspecified: Secondary | ICD-10-CM

## 2021-03-11 DIAGNOSIS — F1910 Other psychoactive substance abuse, uncomplicated: Secondary | ICD-10-CM | POA: Diagnosis not present

## 2021-03-11 DIAGNOSIS — N1832 Chronic kidney disease, stage 3b: Secondary | ICD-10-CM | POA: Diagnosis present

## 2021-03-11 DIAGNOSIS — G9340 Encephalopathy, unspecified: Secondary | ICD-10-CM | POA: Diagnosis not present

## 2021-03-11 DIAGNOSIS — G936 Cerebral edema: Secondary | ICD-10-CM | POA: Diagnosis present

## 2021-03-11 DIAGNOSIS — I5042 Chronic combined systolic (congestive) and diastolic (congestive) heart failure: Secondary | ICD-10-CM | POA: Diagnosis not present

## 2021-03-11 DIAGNOSIS — I5043 Acute on chronic combined systolic (congestive) and diastolic (congestive) heart failure: Secondary | ICD-10-CM | POA: Diagnosis present

## 2021-03-11 DIAGNOSIS — Z853 Personal history of malignant neoplasm of breast: Secondary | ICD-10-CM | POA: Diagnosis not present

## 2021-03-11 DIAGNOSIS — Z515 Encounter for palliative care: Secondary | ICD-10-CM | POA: Diagnosis not present

## 2021-03-11 DIAGNOSIS — G9389 Other specified disorders of brain: Secondary | ICD-10-CM | POA: Diagnosis not present

## 2021-03-11 DIAGNOSIS — Z681 Body mass index (BMI) 19 or less, adult: Secondary | ICD-10-CM

## 2021-03-11 DIAGNOSIS — S2232XA Fracture of one rib, left side, initial encounter for closed fracture: Secondary | ICD-10-CM | POA: Diagnosis present

## 2021-03-11 DIAGNOSIS — W182XXA Fall in (into) shower or empty bathtub, initial encounter: Secondary | ICD-10-CM | POA: Diagnosis present

## 2021-03-11 DIAGNOSIS — F333 Major depressive disorder, recurrent, severe with psychotic symptoms: Secondary | ICD-10-CM | POA: Diagnosis present

## 2021-03-11 DIAGNOSIS — C7931 Secondary malignant neoplasm of brain: Principal | ICD-10-CM | POA: Diagnosis present

## 2021-03-11 DIAGNOSIS — Y92002 Bathroom of unspecified non-institutional (private) residence single-family (private) house as the place of occurrence of the external cause: Secondary | ICD-10-CM | POA: Diagnosis not present

## 2021-03-11 DIAGNOSIS — W19XXXA Unspecified fall, initial encounter: Secondary | ICD-10-CM

## 2021-03-11 DIAGNOSIS — F1410 Cocaine abuse, uncomplicated: Secondary | ICD-10-CM | POA: Diagnosis present

## 2021-03-11 DIAGNOSIS — F19951 Other psychoactive substance use, unspecified with psychoactive substance-induced psychotic disorder with hallucinations: Secondary | ICD-10-CM | POA: Diagnosis not present

## 2021-03-11 DIAGNOSIS — R5381 Other malaise: Secondary | ICD-10-CM | POA: Diagnosis not present

## 2021-03-11 DIAGNOSIS — E43 Unspecified severe protein-calorie malnutrition: Secondary | ICD-10-CM | POA: Insufficient documentation

## 2021-03-11 DIAGNOSIS — G40909 Epilepsy, unspecified, not intractable, without status epilepticus: Secondary | ICD-10-CM | POA: Diagnosis present

## 2021-03-11 DIAGNOSIS — I130 Hypertensive heart and chronic kidney disease with heart failure and stage 1 through stage 4 chronic kidney disease, or unspecified chronic kidney disease: Secondary | ICD-10-CM | POA: Diagnosis present

## 2021-03-11 DIAGNOSIS — K5900 Constipation, unspecified: Secondary | ICD-10-CM | POA: Diagnosis not present

## 2021-03-11 DIAGNOSIS — Z87891 Personal history of nicotine dependence: Secondary | ICD-10-CM

## 2021-03-11 DIAGNOSIS — R410 Disorientation, unspecified: Secondary | ICD-10-CM | POA: Diagnosis not present

## 2021-03-11 DIAGNOSIS — M79604 Pain in right leg: Secondary | ICD-10-CM | POA: Diagnosis not present

## 2021-03-11 DIAGNOSIS — R4182 Altered mental status, unspecified: Secondary | ICD-10-CM | POA: Diagnosis not present

## 2021-03-11 DIAGNOSIS — F339 Major depressive disorder, recurrent, unspecified: Secondary | ICD-10-CM | POA: Diagnosis present

## 2021-03-11 DIAGNOSIS — D638 Anemia in other chronic diseases classified elsewhere: Secondary | ICD-10-CM | POA: Diagnosis not present

## 2021-03-11 DIAGNOSIS — S0083XA Contusion of other part of head, initial encounter: Secondary | ICD-10-CM | POA: Diagnosis present

## 2021-03-11 DIAGNOSIS — R531 Weakness: Secondary | ICD-10-CM | POA: Diagnosis present

## 2021-03-11 DIAGNOSIS — E875 Hyperkalemia: Secondary | ICD-10-CM | POA: Diagnosis not present

## 2021-03-11 DIAGNOSIS — J918 Pleural effusion in other conditions classified elsewhere: Secondary | ICD-10-CM | POA: Diagnosis present

## 2021-03-11 LAB — CBC WITH DIFFERENTIAL/PLATELET
Abs Immature Granulocytes: 0.01 10*3/uL (ref 0.00–0.07)
Basophils Absolute: 0 10*3/uL (ref 0.0–0.1)
Basophils Relative: 1 %
Eosinophils Absolute: 0 10*3/uL (ref 0.0–0.5)
Eosinophils Relative: 0 %
HCT: 39.9 % (ref 36.0–46.0)
Hemoglobin: 13.3 g/dL (ref 12.0–15.0)
Immature Granulocytes: 0 %
Lymphocytes Relative: 32 %
Lymphs Abs: 1.3 10*3/uL (ref 0.7–4.0)
MCH: 33.8 pg (ref 26.0–34.0)
MCHC: 33.3 g/dL (ref 30.0–36.0)
MCV: 101.5 fL — ABNORMAL HIGH (ref 80.0–100.0)
Monocytes Absolute: 0.5 10*3/uL (ref 0.1–1.0)
Monocytes Relative: 13 %
Neutro Abs: 2.3 10*3/uL (ref 1.7–7.7)
Neutrophils Relative %: 54 %
Platelets: 233 10*3/uL (ref 150–400)
RBC: 3.93 MIL/uL (ref 3.87–5.11)
RDW: 14.2 % (ref 11.5–15.5)
WBC: 4.2 10*3/uL (ref 4.0–10.5)
nRBC: 0 % (ref 0.0–0.2)

## 2021-03-11 LAB — COMPREHENSIVE METABOLIC PANEL
ALT: 15 U/L (ref 0–44)
AST: 24 U/L (ref 15–41)
Albumin: 2.9 g/dL — ABNORMAL LOW (ref 3.5–5.0)
Alkaline Phosphatase: 195 U/L — ABNORMAL HIGH (ref 38–126)
Anion gap: 9 (ref 5–15)
BUN: 14 mg/dL (ref 6–20)
CO2: 20 mmol/L — ABNORMAL LOW (ref 22–32)
Calcium: 9 mg/dL (ref 8.9–10.3)
Chloride: 103 mmol/L (ref 98–111)
Creatinine, Ser: 1.93 mg/dL — ABNORMAL HIGH (ref 0.44–1.00)
GFR, Estimated: 30 mL/min — ABNORMAL LOW (ref >60)
Glucose, Bld: 102 mg/dL — ABNORMAL HIGH (ref 70–99)
Potassium: 4 mmol/L (ref 3.5–5.1)
Sodium: 132 mmol/L — ABNORMAL LOW (ref 135–145)
Total Bilirubin: 0.7 mg/dL (ref 0.3–1.2)
Total Protein: 7.8 g/dL (ref 6.5–8.1)

## 2021-03-11 LAB — LACTIC ACID, PLASMA: Lactic Acid, Venous: 1.9 mmol/L (ref 0.5–1.9)

## 2021-03-11 LAB — RESP PANEL BY RT-PCR (FLU A&B, COVID) ARPGX2
Influenza A by PCR: NEGATIVE
Influenza B by PCR: NEGATIVE
SARS Coronavirus 2 by RT PCR: NEGATIVE

## 2021-03-11 LAB — AMMONIA: Ammonia: 27 umol/L (ref 9–35)

## 2021-03-11 IMAGING — CR DG CHEST 2V
2 series · 2 of 2 positions shown · non-contrast
Comparison: Prior chest radiographs [DATE] and earlier. Chest
CT [DATE]

CLINICAL DATA: Fall. Additional history provided: Patient reports
not eating for 4 days, 2 falls over the past 2 days, left-sided rib
pain and headache.

EXAM:
CHEST - 2 VIEW

[w chest lat]
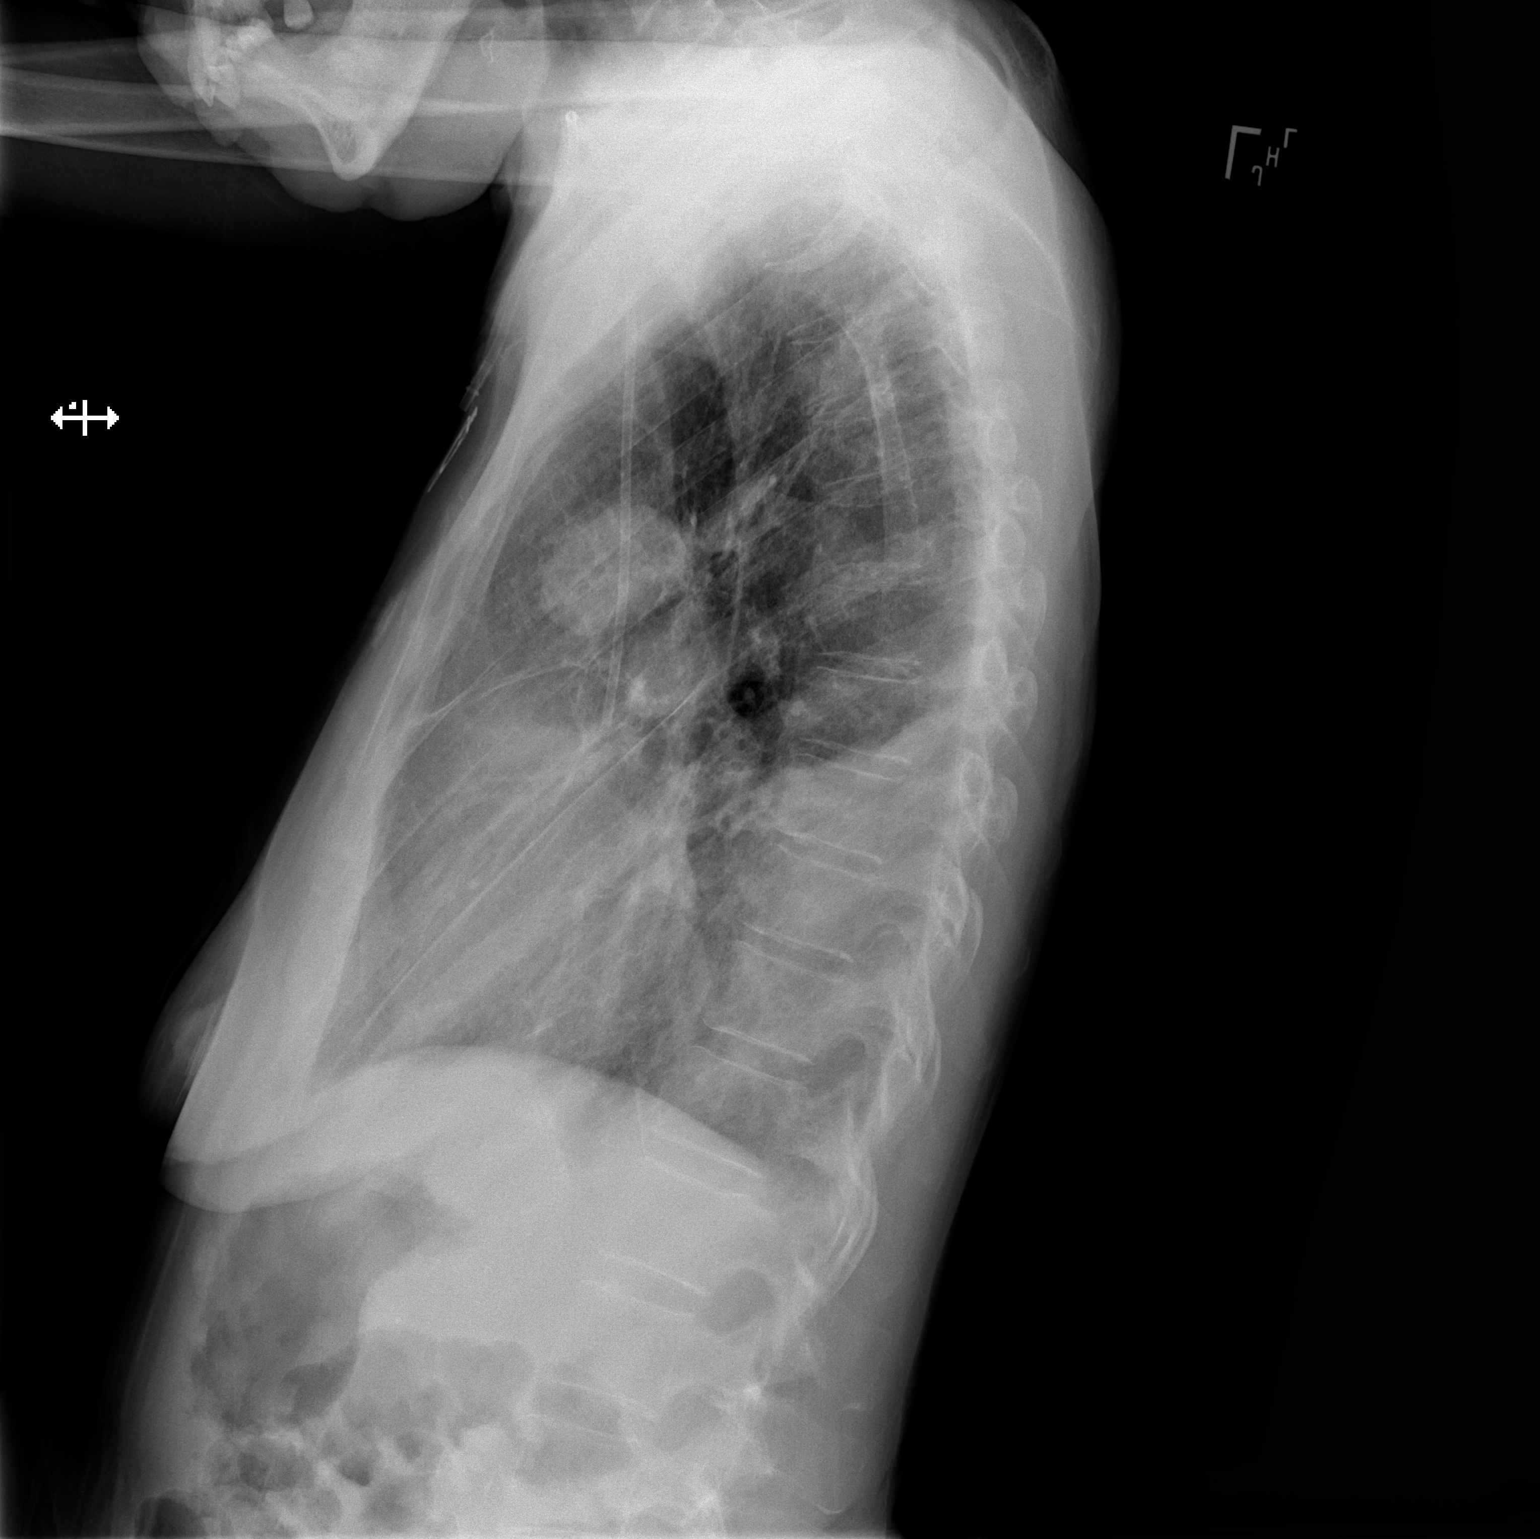

[w chest pa]
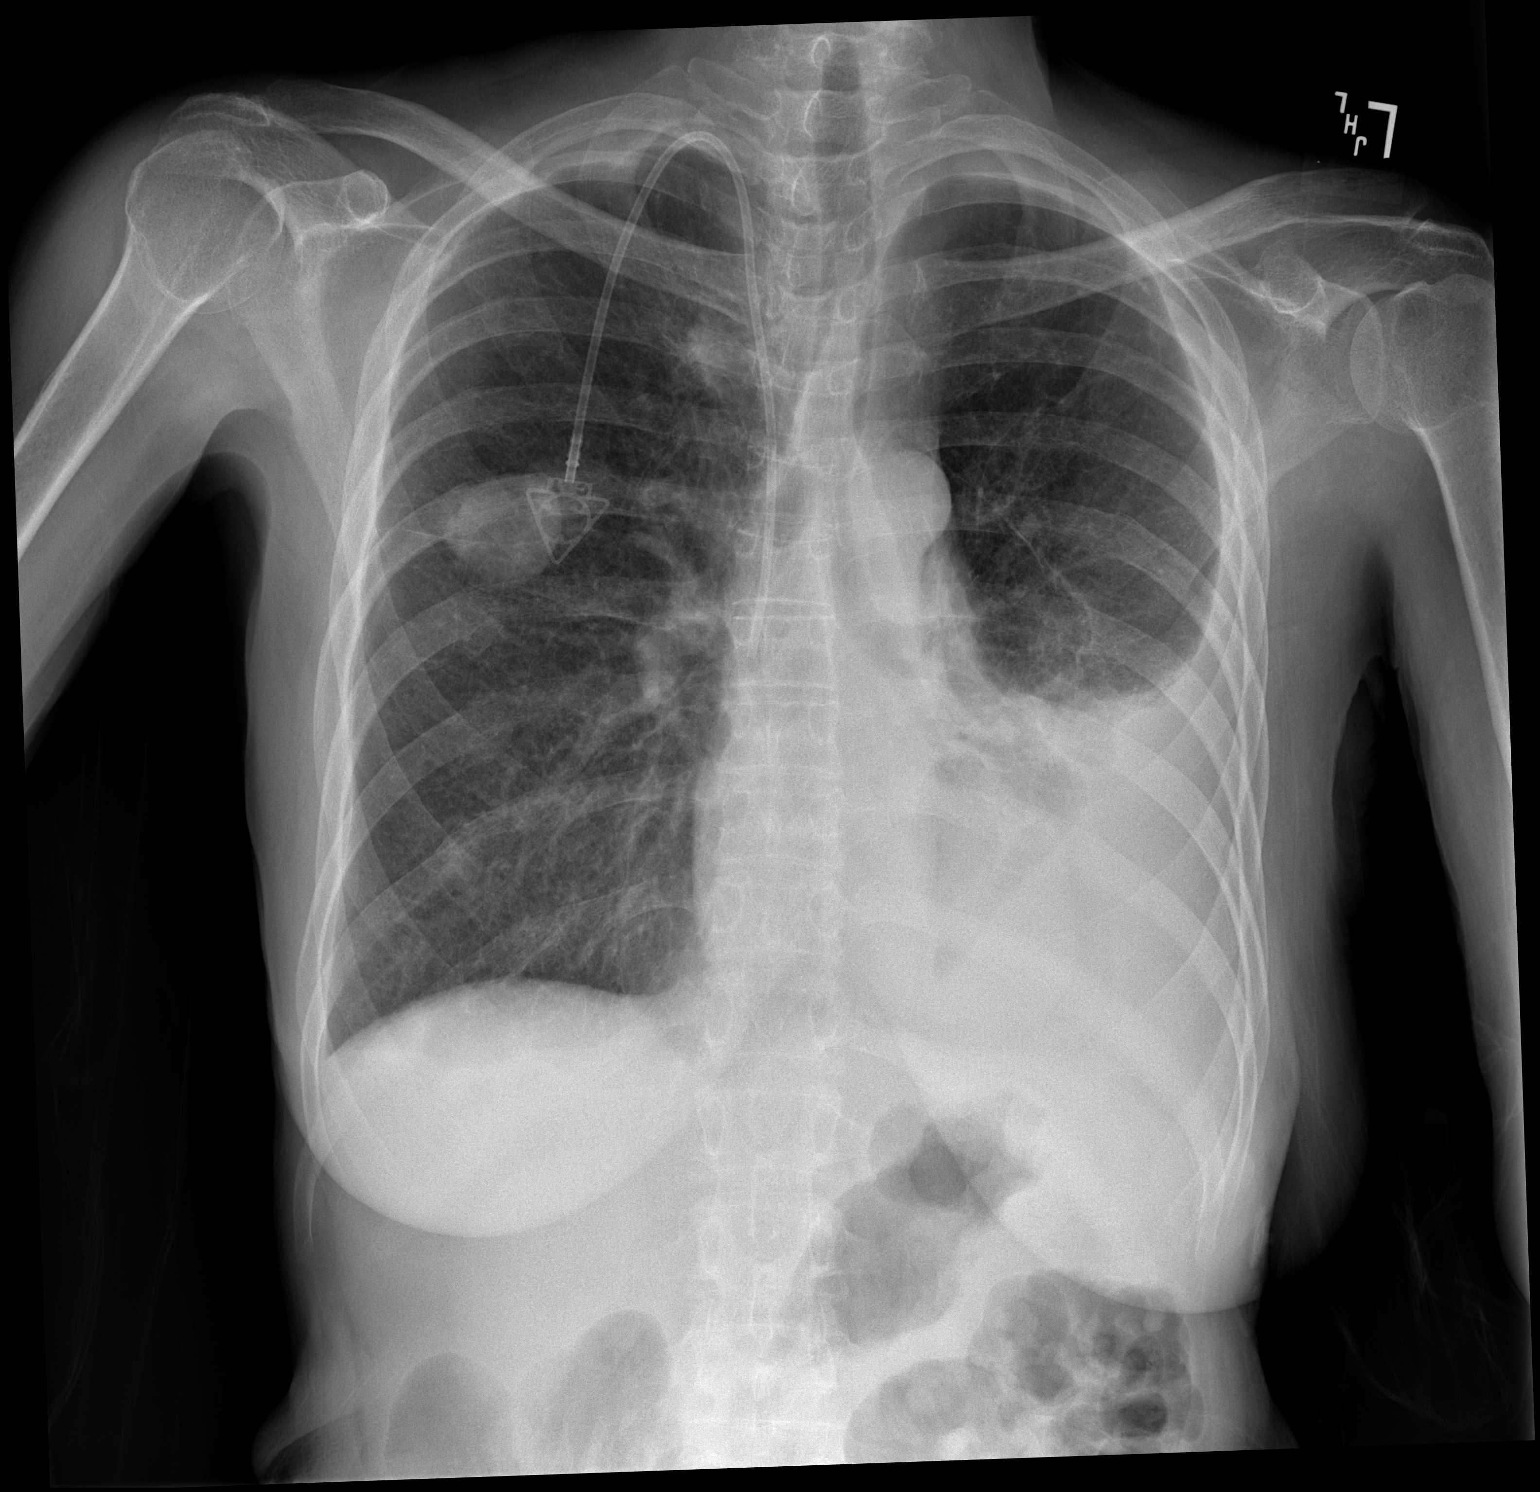

[2 of 2 positions shown; findings below may reference images not displayed]

FINDINGS: A right chest infusion port catheter is present with tip projecting
at the level of the lower SVC. The cardiac silhouette is partially
obscured, limiting evaluation of heart size. Moderate-sized left
pleural effusion with associated left basilar atelectasis and/or
consolidation. A partially calcified right upper lobe mass appears
increased in size as compared to the prior chest radiograph of
[DATE], now measuring at least 3.4 cm. A smaller nodule within
the medial right upper lobe also appears more conspicuous. No
evidence of pneumothorax. No acute bony abnormality is identified.
However, correlate with findings on concurrently performed
radiographs of the left ribs.
IMPRESSION: A right upper lobe partially calcified mass appears increased in
size as compared to the prior chest radiograph [DATE], now
measuring at least 3.4 cm. A smaller nodule within the medial right
upper lobe also appears more conspicuous. A contrast-enhanced chest
CT may be obtained for further evaluation, as clinically warranted.

Moderate-sized left pleural effusion with associated left basilar
atelectasis and/or consolidation.

## 2021-03-11 IMAGING — CT CT HEAD W/O CM
3 series · 15 of 47 positions shown, 18 images · non-contrast
Comparison: Brain MRI [DATE]

CLINICAL DATA: Trauma, history of malignancy per the CT
technologist.

EXAM:
CT HEAD WITHOUT CONTRAST
TECHNIQUE: Contiguous axial images were obtained from the base of the skull
through the vertex without intravenous contrast.

[Series 2: head wo · axial · 0.47mm/px · z∈[+1370,+1505]mm · 9 of 33 slices shown, 12 images]
[im 3/33  brain]
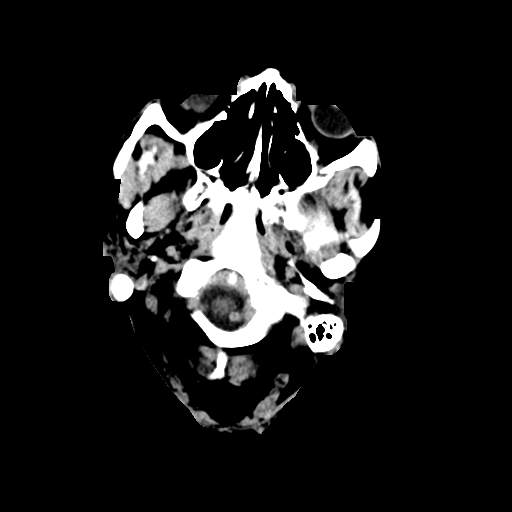
[im 3/33  bone]
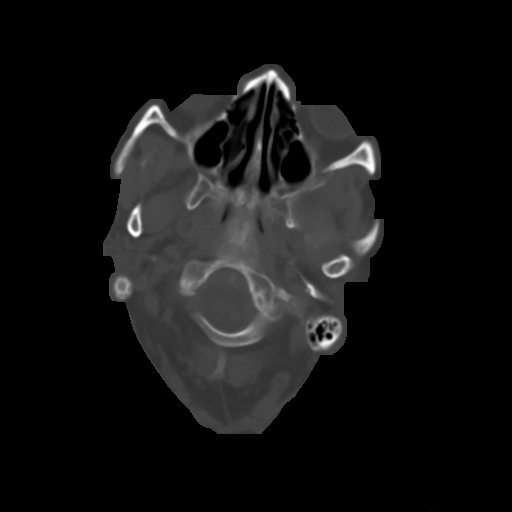
[im 6/33  brain]
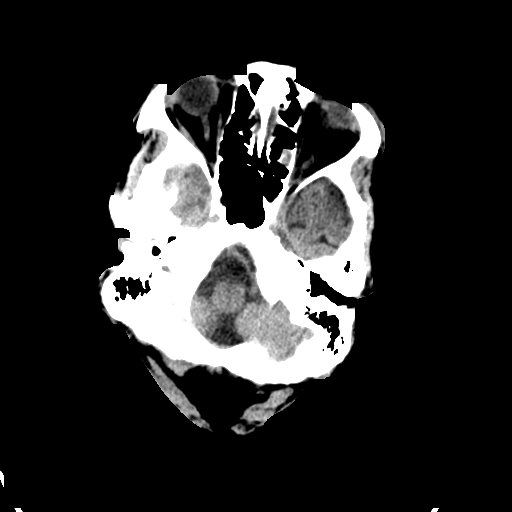
[im 9/33  brain]
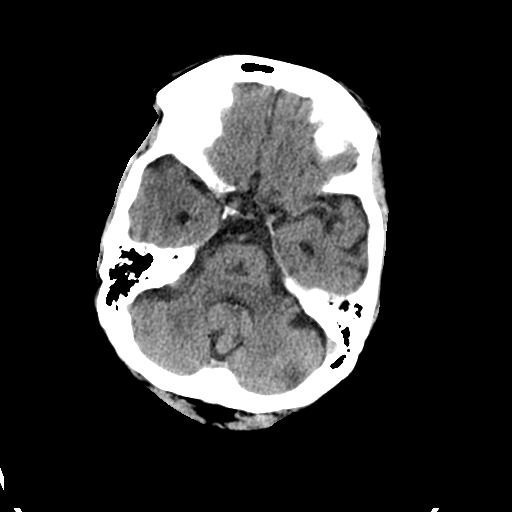
[im 13/33  brain]
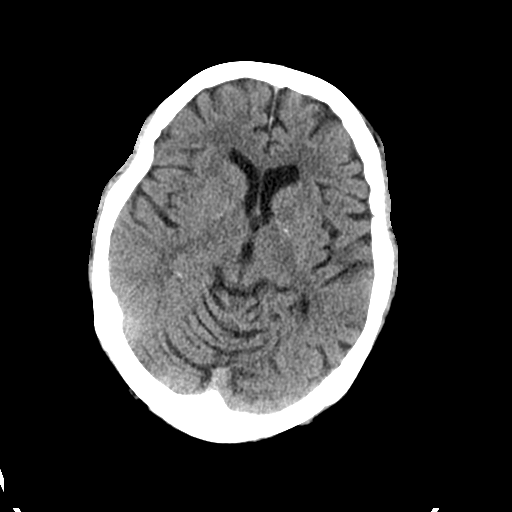
[im 17/33  brain]
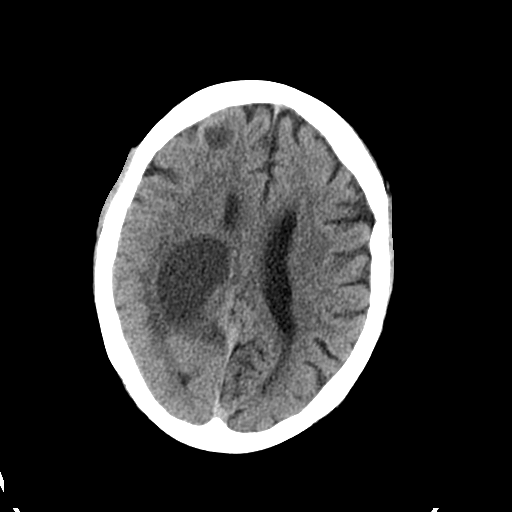
[im 17/33  bone]
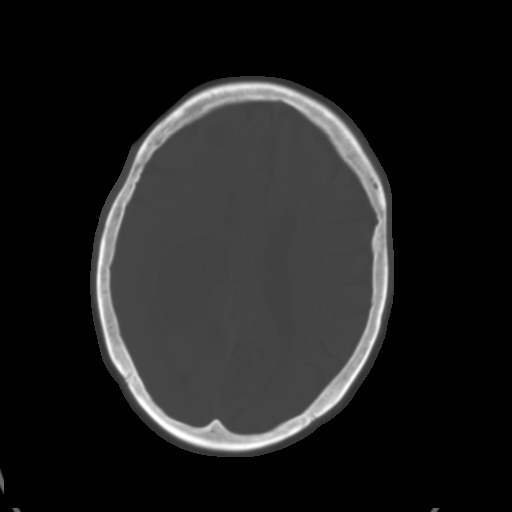
[im 20/33  brain]
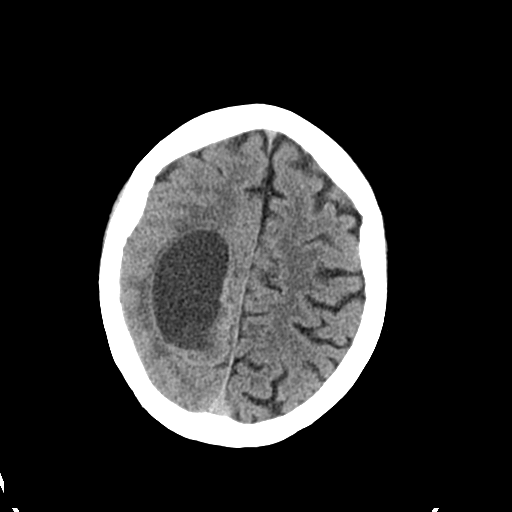
[im 24/33  brain]
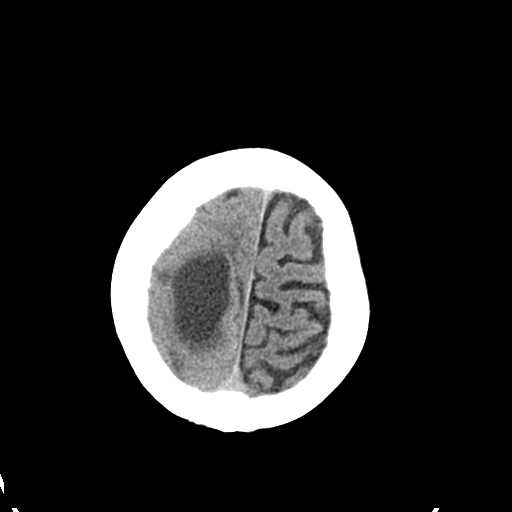
[im 27/33  brain]
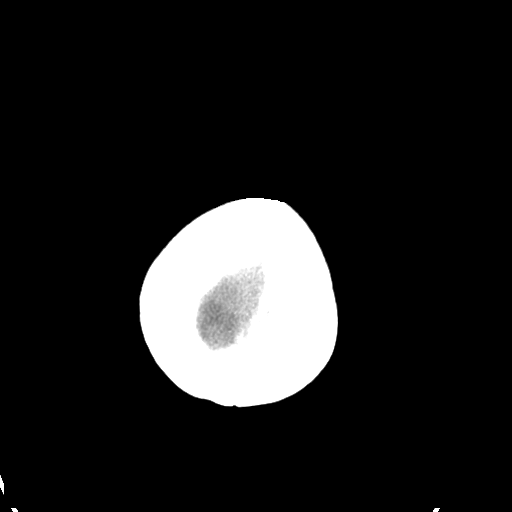
[im 30/33  brain]
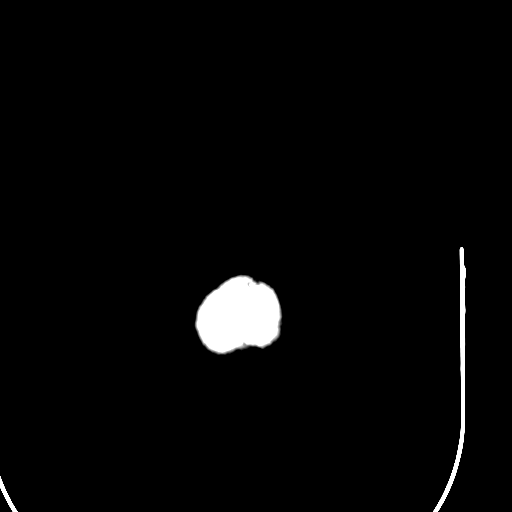
[im 30/33  bone]
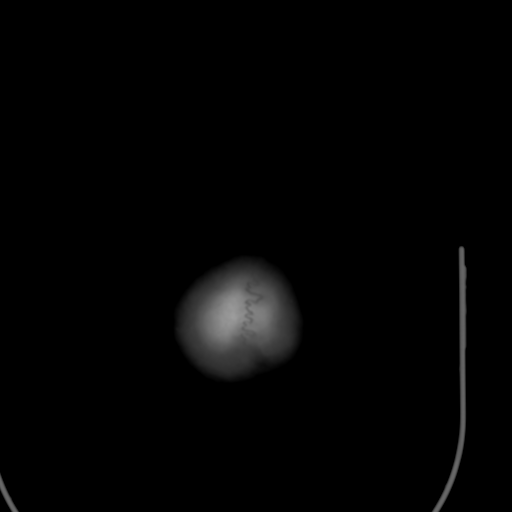

[Series 5: coronal soft tissue · coronal · 0.34mm/px · 3 of 68 slices shown]
[im 23/68  brain]
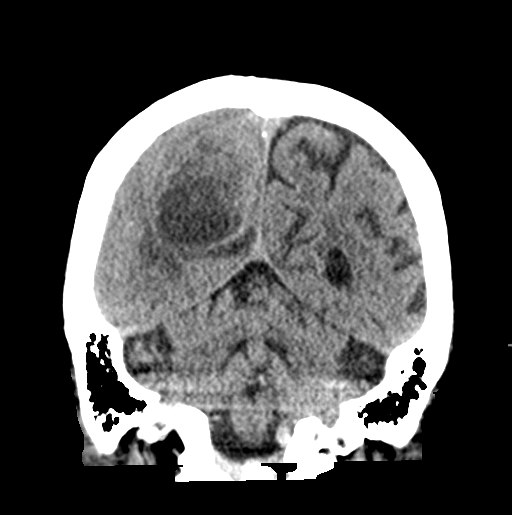
[im 30/68  brain]
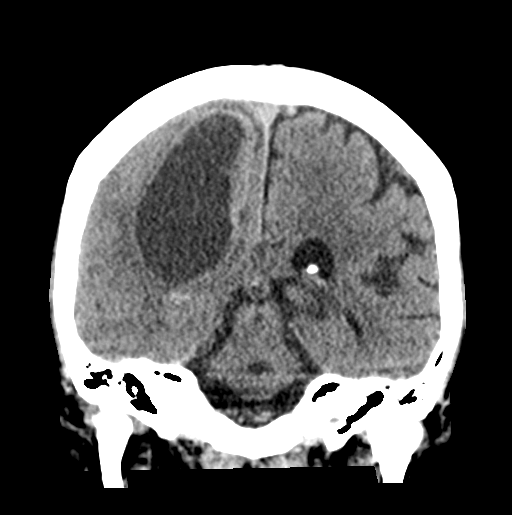
[im 38/68  brain]
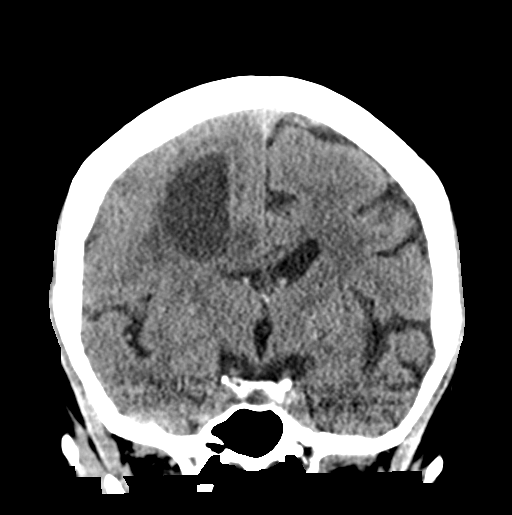

[Series 6: sagittal soft tissue · sagittal · 0.34mm/px · 3 of 59 slices shown]
[im 20/59  brain]
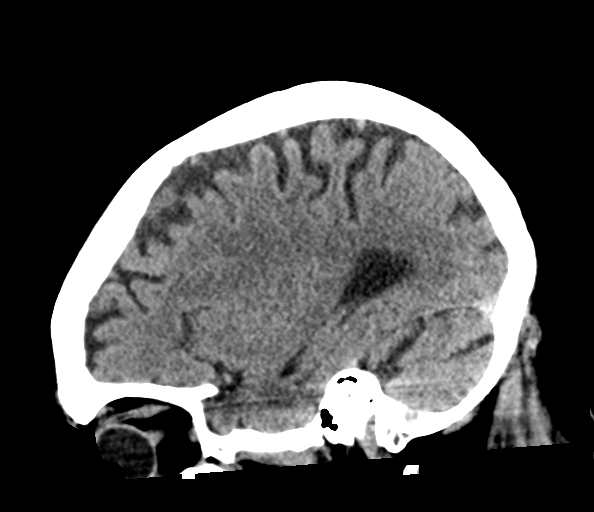
[im 30/59  brain]
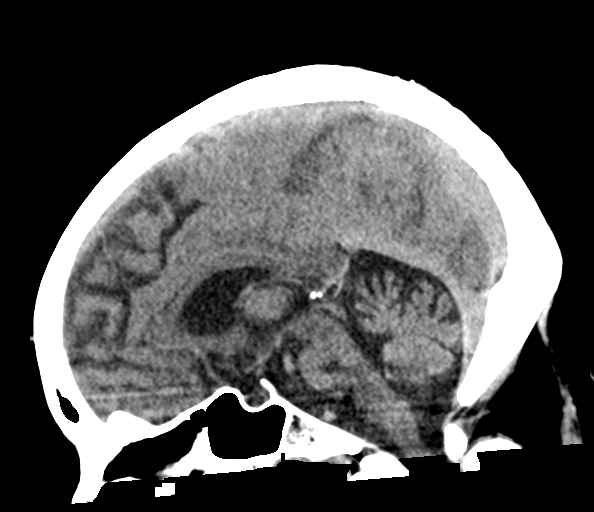
[im 39/59  brain]
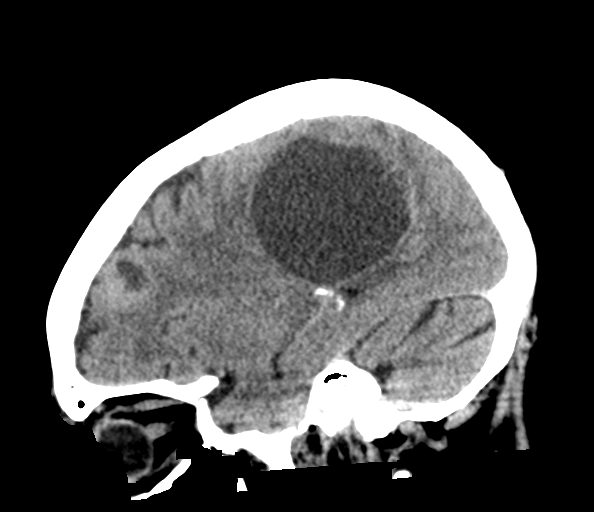

[15 of 47 positions shown; findings below may reference images not displayed]

FINDINGS: Brain: There is a new large solid and cystic lesion centered in the
right centrum semiovale measuring 6.4 cm AP x 3.9 cm TV by 6.0 cm
cc. There is a smaller new solid and cystic lesion in the right
frontal lobe measuring 1.9 cm AP by 1.8 cm TV by 1.8 cm cc. The
larger lesion exerts regional mass effect with mild perilesional
edema resulting in partial effacement of the right lateral ventricle
and effacement of the surrounding sulci. There is no measurable
midline shift.

No other definite lesions are seen. There is no evidence of acute
intracranial hemorrhage or extra-axial fluid collection. There is a
remote infarct in the pons, unchanged.

Vascular: No hyperdense vessel or unexpected calcification.

Skull: Normal. Negative for fracture or focal lesion.

Sinuses/Orbits: There is mucosal thickening in the left maxillary
sinus, incompletely imaged. The globes and orbits are unremarkable.

Other: None.
IMPRESSION: Two solid and cystic lesions in the right cerebral hemisphere, the
largest measuring up to 6.4 cm, are new since [DATE], concerning
for metastatic disease. There is regional mass effect caused by the
larger lesion with partial effacement of the right lateral ventricle
but no midline shift. Brain MRI with and without contrast is
recommended for further characterization.

These results were called by telephone at the time of interpretation
on [DATE] at [DATE] to provider Dr JUNE, who verbally
acknowledged these results.

## 2021-03-11 IMAGING — CR DG RIBS W/ CHEST 3+V*L*
2 series · 2 of 2 positions shown · non-contrast
Comparison: Same day chest radiographs [DATE]. Prior chest
radiographs [DATE] and earlier.

CLINICAL DATA: Provided history: Fall.

EXAM:
LEFT RIBS AND CHEST - 3+ VIEW

[w ribs ap upper left]
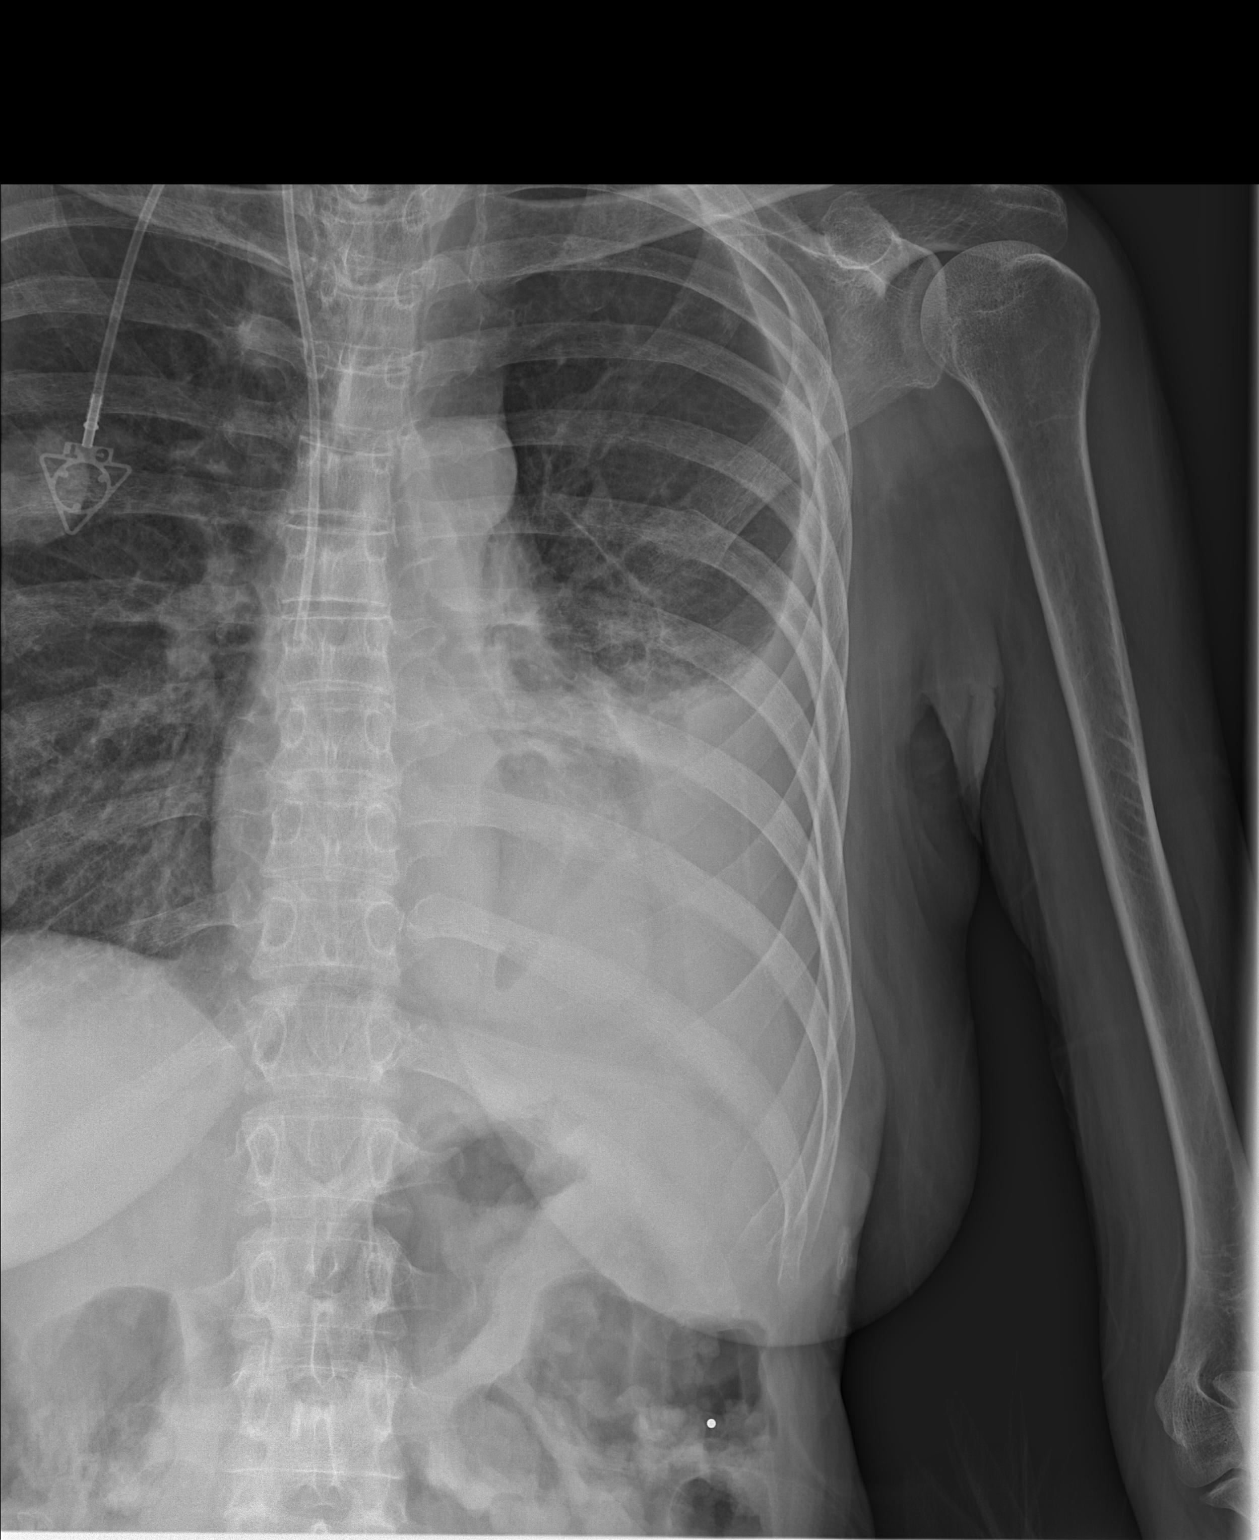

[w ribs obl left]
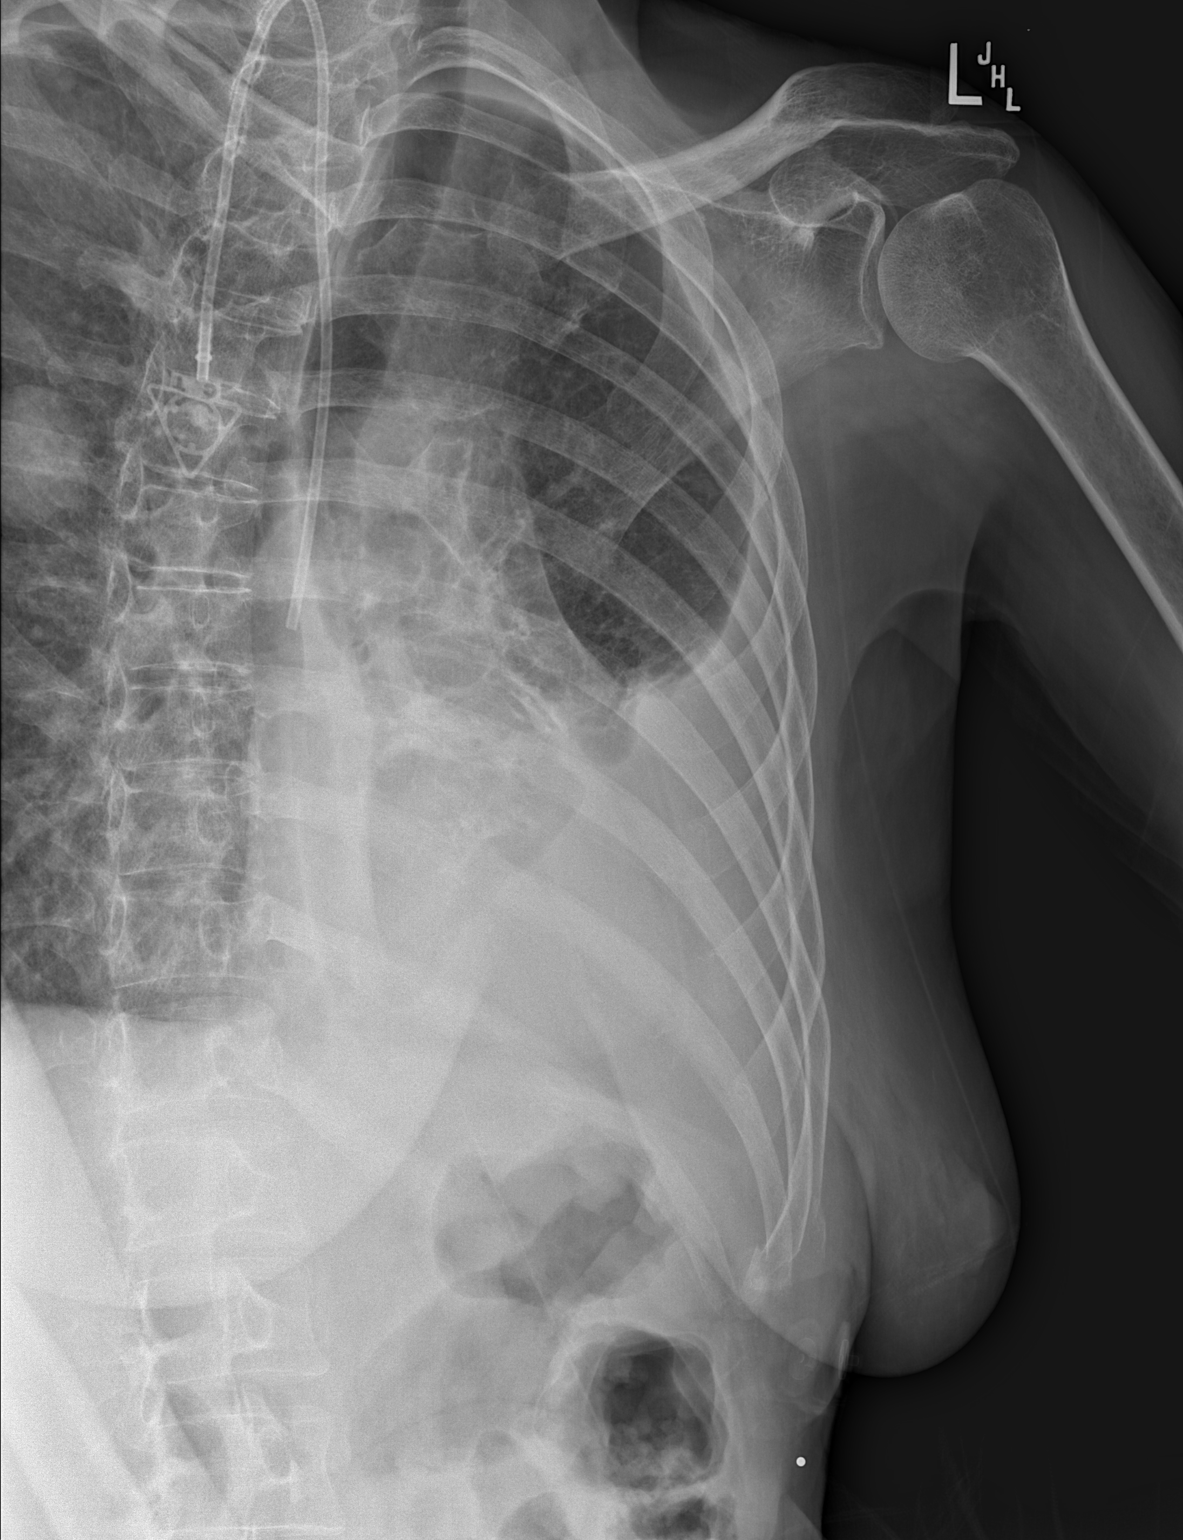

[2 of 2 positions shown; findings below may reference images not displayed]

FINDINGS: No acute displaced left rib fracture is identified. There are
multiple known chronic, healed left-sided rib fracture deformities.
Please refer to the concurrently performed chest radiographs for a
description of intrathoracic findings.
IMPRESSION: No acute, displaced left rib fracture is identified. A CT of the
thorax may be obtained for further evaluation, if clinically
warranted.

## 2021-03-11 IMAGING — CT CT CHEST-ABD-PELV W/O CM
2 of 4 series · 13 of 36 positions shown, 15 images · non-contrast
Comparison: CT dated [DATE].

CLINICAL DATA: Metastatic disease evaluation.

EXAM:
CT CHEST, ABDOMEN AND PELVIS WITHOUT CONTRAST
TECHNIQUE: Multidetector CT imaging of the chest, abdomen and pelvis was
performed following the standard protocol without IV contrast.

[Series 2: cap w/o · axial · non-contrast · 0.71mm/px · z∈[-192,+308]mm · 10 of 124 slices shown, 12 images]
[im 12/124  mediastinal]
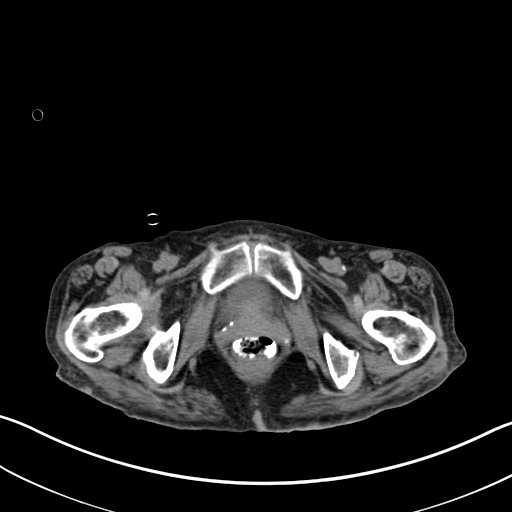
[im 12/124  bone]
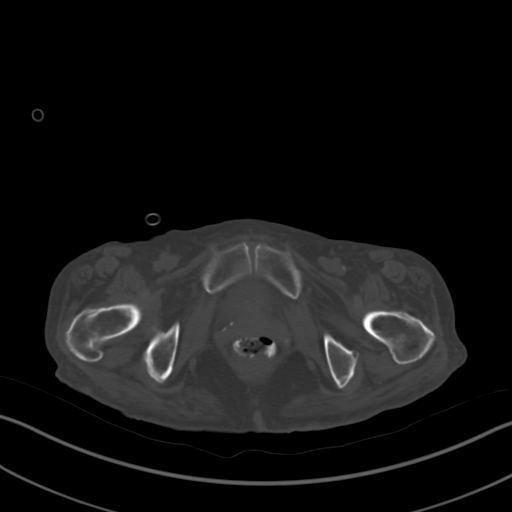
[im 23/124  mediastinal]
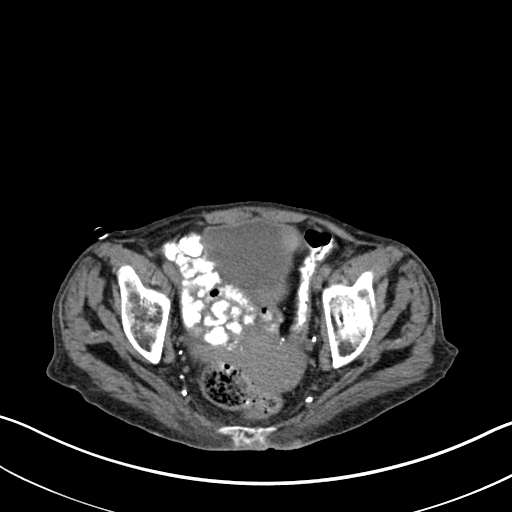
[im 34/124  mediastinal]
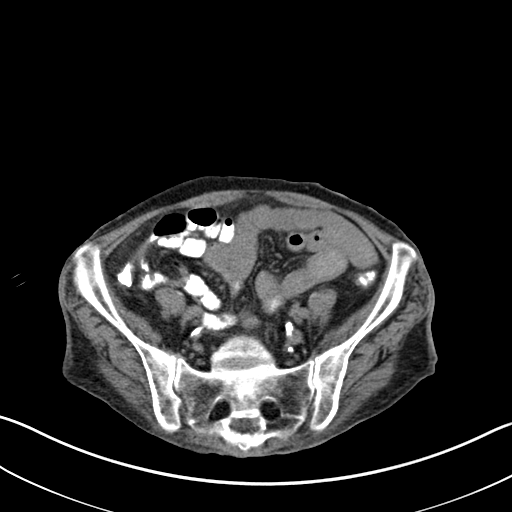
[im 45/124  mediastinal]
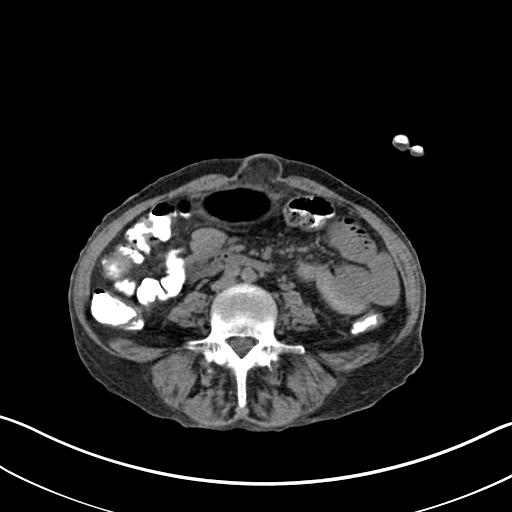
[im 56/124  mediastinal]
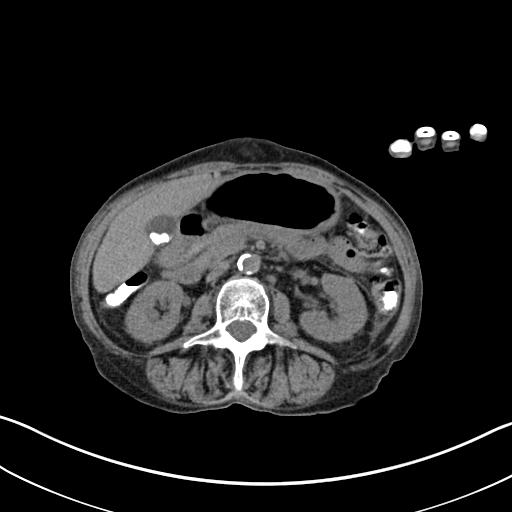
[im 68/124  mediastinal]
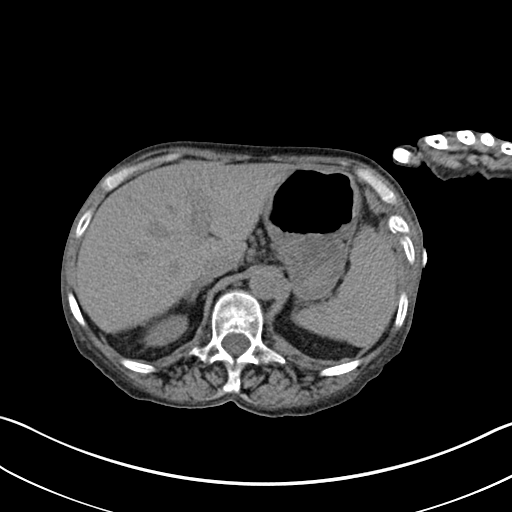
[im 79/124  mediastinal]
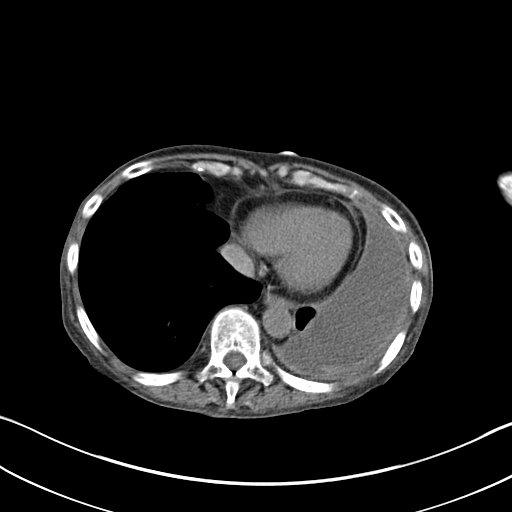
[im 90/124  mediastinal]
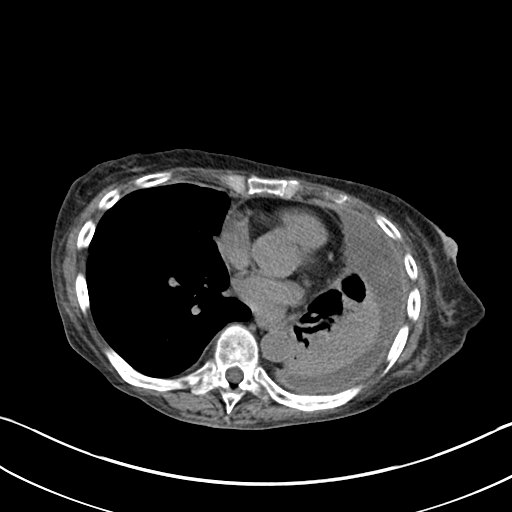
[im 101/124  mediastinal]
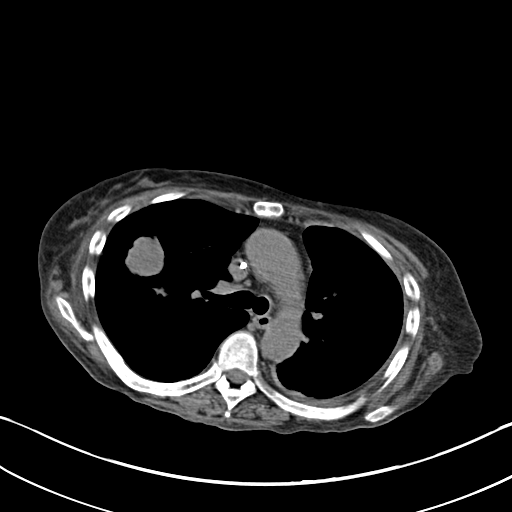
[im 101/124  bone]
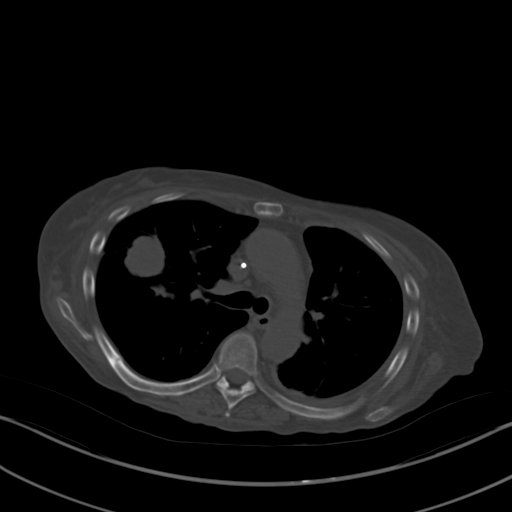
[im 112/124  mediastinal]
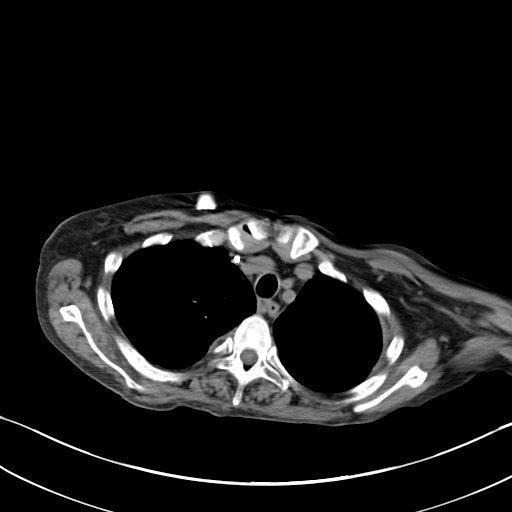

[Series 5: coronals · coronal · 0.62mm/px · 3 of 123 slices shown]
[im 25/123  mediastinal]
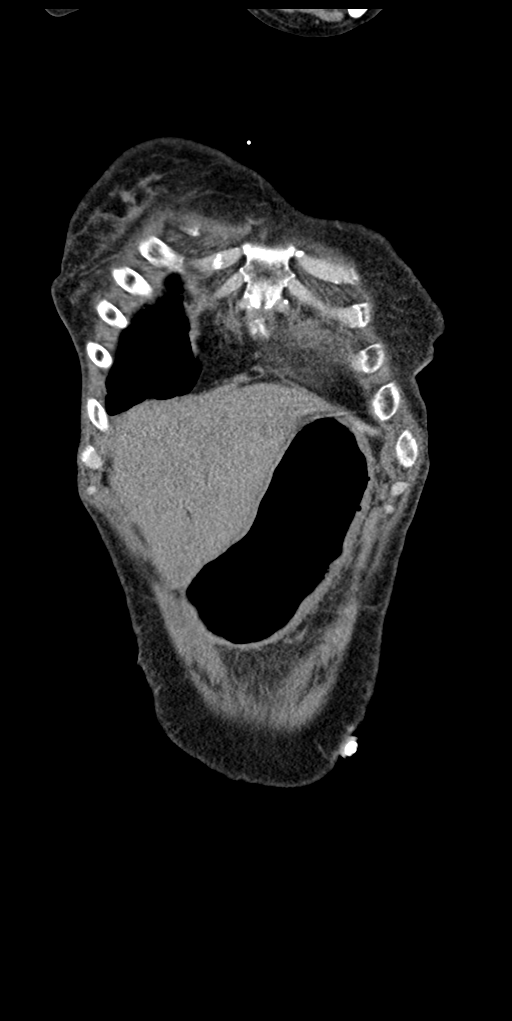
[im 49/123  mediastinal]
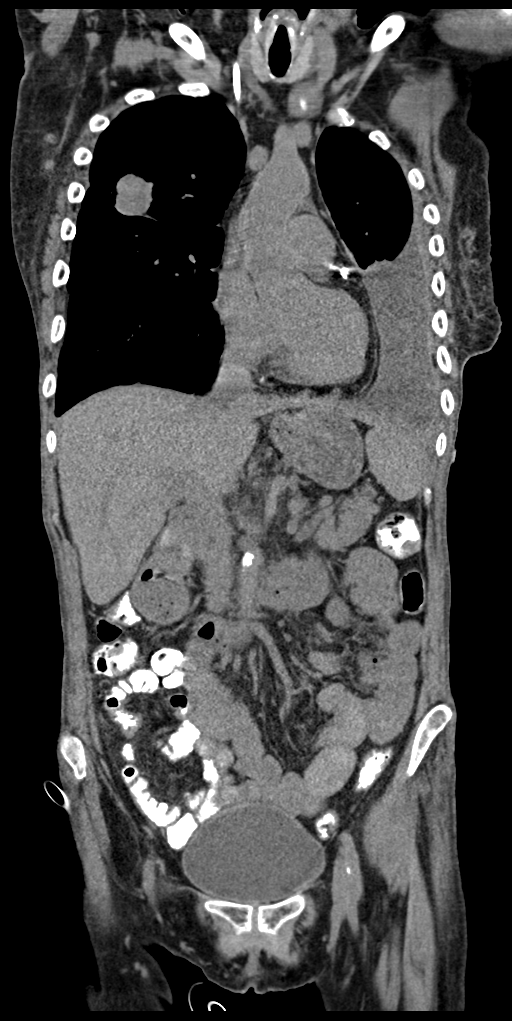
[im 74/123  mediastinal]
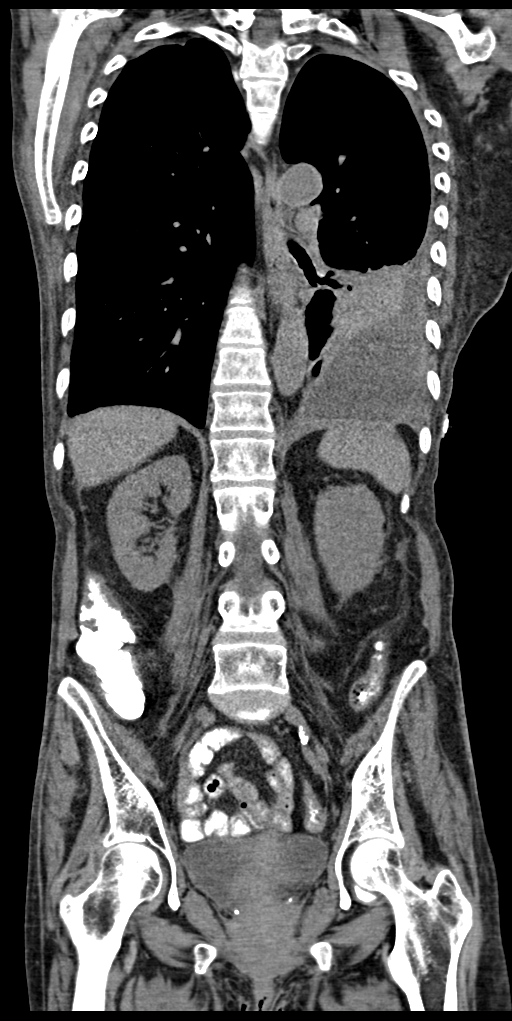

[13 of 36 positions shown; findings below may reference images not displayed]

FINDINGS: Evaluation of this exam is limited in the absence of intravenous
contrast.

CT CHEST FINDINGS

Cardiovascular: No cardiomegaly or pericardial effusion. There is
coronary vascular calcification. Right-sided Port-A-Cath with tip at
the cavoatrial junction. The thoracic aorta and central pulmonary
arteries are grossly unremarkable on this noncontrast CT.

Mediastinum/Nodes: No hilar or mediastinal adenopathy. The esophagus
is grossly unremarkable. No mediastinal fluid collection.

Lungs/Pleura: Moderate size left pleural effusion slightly increased
since the prior CT. There is partial compressive atelectasis of the
left lung versus pneumonia. Background of emphysema. Interval
increase in the size of the dominant right upper lobe mass measuring
3 x 3 cm (previously 2 x 2 cm). A 1.2 x 0.8 cm nodular density with
spiculated margins adjacent to the dominant mass is similar to prior
CT. Additionally there is a 1.9 x 2.7 cm pleural based nodule with
irregular/spiculated margin from the posteromedial right upper lobe
similar or minimally increased since the prior CT. There is trace
right pleural effusion. No pneumothorax. The central airways are
patent.

Musculoskeletal: Old healed bilateral posterior rib fractures. No
acute osseous pathology.

CT ABDOMEN PELVIS FINDINGS

No intra-abdominal free air or free fluid.

Hepatobiliary: The liver is unremarkable. No intrahepatic biliary
dilatation. Multiple gallstones. No pericholecystic fluid or
evidence of acute cholecystitis by CT.

Pancreas: Unremarkable. No pancreatic ductal dilatation or
surrounding inflammatory changes.

Spleen: Normal in size without focal abnormality.

Adrenals/Urinary Tract: The adrenal glands unremarkable. The
kidneys, visualized ureters, and urinary bladder appear
unremarkable.

Stomach/Bowel: There is no bowel obstruction or active inflammation.
The appendix is normal.

Vascular/Lymphatic: Moderate aortoiliac atherosclerotic disease. The
IVC is unremarkable. No portal venous gas. There is no adenopathy.

Reproductive: The uterus and ovaries are grossly unremarkable. No
pelvic masses

Other: Several small fat containing supraumbilical hernia.

Musculoskeletal: Osteopenia.  No acute osseous pathology.
IMPRESSION: 1. Interval increase in the size of the dominant right upper lobe
mass.
2. Slight interval increase in the size of the pleural based nodule
in the posteromedial right upper lobe.
3. Moderate size left pleural effusion slightly increased since the
prior CT. There is partial compressive atelectasis of the left lung
versus pneumonia.
4. Cholelithiasis.
5. No evidence of metastatic disease in the abdomen or pelvis.
6. Aortic Atherosclerosis ([F4]-[F4]) and Emphysema ([F4]-[F4]).

## 2021-03-11 MED ORDER — ONDANSETRON HCL 4 MG PO TABS
4.0000 mg | ORAL_TABLET | Freq: Four times a day (QID) | ORAL | Status: DC | PRN
  Filled 2021-03-11: qty 1

## 2021-03-11 MED ORDER — HEPARIN SODIUM (PORCINE) 5000 UNIT/ML IJ SOLN
5000.0000 [IU] | Freq: Three times a day (TID) | INTRAMUSCULAR | Status: DC
  Administered 2021-03-11 – 2021-04-05 (×74): 5000 [IU] via SUBCUTANEOUS
  Filled 2021-03-11 (×75): qty 1

## 2021-03-11 MED ORDER — FERROUS SULFATE 325 (65 FE) MG PO TABS
325.0000 mg | ORAL_TABLET | Freq: Two times a day (BID) | ORAL | Status: DC
  Administered 2021-03-12 – 2021-04-05 (×51): 325 mg via ORAL
  Filled 2021-03-11 (×50): qty 1

## 2021-03-11 MED ORDER — ACETAMINOPHEN 325 MG PO TABS: 650.0000 mg | ORAL_TABLET | Freq: Four times a day (QID) | ORAL | Status: DC | PRN

## 2021-03-11 MED ORDER — ACETAMINOPHEN 325 MG PO TABS
650.0000 mg | ORAL_TABLET | Freq: Four times a day (QID) | ORAL | Status: DC | PRN
  Administered 2021-03-12 – 2021-03-30 (×11): 650 mg via ORAL
  Filled 2021-03-11 (×11): qty 2

## 2021-03-11 MED ORDER — IOHEXOL 9 MG/ML PO SOLN
ORAL | Status: AC
  Filled 2021-03-11: qty 1000

## 2021-03-11 MED ORDER — PANTOPRAZOLE SODIUM 40 MG PO TBEC
40.0000 mg | DELAYED_RELEASE_TABLET | Freq: Two times a day (BID) | ORAL | Status: DC
  Administered 2021-03-12 – 2021-04-05 (×48): 40 mg via ORAL
  Filled 2021-03-11 (×50): qty 1

## 2021-03-11 MED ORDER — POLYETHYLENE GLYCOL 3350 17 G PO PACK
17.0000 g | PACK | Freq: Two times a day (BID) | ORAL | Status: DC
  Administered 2021-03-12 – 2021-04-05 (×22): 17 g via ORAL
  Filled 2021-03-11 (×38): qty 1

## 2021-03-11 MED ORDER — OXYCODONE HCL 5 MG PO TABS
5.0000 mg | ORAL_TABLET | Freq: Four times a day (QID) | ORAL | Status: DC | PRN
  Administered 2021-03-12 – 2021-04-05 (×37): 5 mg via ORAL
  Filled 2021-03-11 (×40): qty 1

## 2021-03-11 MED ORDER — NICOTINE 14 MG/24HR TD PT24
14.0000 mg | MEDICATED_PATCH | Freq: Every day | TRANSDERMAL | Status: DC
  Administered 2021-03-11 – 2021-03-23 (×13): 14 mg via TRANSDERMAL
  Filled 2021-03-11 (×13): qty 1

## 2021-03-11 MED ORDER — ACETAMINOPHEN 650 MG RE SUPP: 650.0000 mg | Freq: Four times a day (QID) | RECTAL | Status: DC | PRN

## 2021-03-11 MED ORDER — ONDANSETRON HCL 4 MG/2ML IJ SOLN: 4.0000 mg | Freq: Four times a day (QID) | INTRAMUSCULAR | Status: DC | PRN

## 2021-03-11 MED ORDER — DEXAMETHASONE SODIUM PHOSPHATE 10 MG/ML IJ SOLN
10.0000 mg | Freq: Once | INTRAMUSCULAR | Status: AC
  Administered 2021-03-11: 17:00:00 10 mg via INTRAVENOUS
  Filled 2021-03-11: qty 1

## 2021-03-11 MED ORDER — SODIUM CHLORIDE 0.9 % IV SOLN: INTRAVENOUS | Status: DC

## 2021-03-11 MED ORDER — GABAPENTIN 300 MG PO CAPS
300.0000 mg | ORAL_CAPSULE | Freq: Every day | ORAL | Status: DC
  Administered 2021-03-12 – 2021-04-04 (×25): 300 mg via ORAL
  Filled 2021-03-11 (×25): qty 1

## 2021-03-11 MED ORDER — GADOBUTROL 1 MMOL/ML IV SOLN
6.0000 mL | Freq: Once | INTRAVENOUS | Status: AC | PRN
  Administered 2021-03-11: 19:00:00 6 mL via INTRAVENOUS

## 2021-03-11 MED ORDER — THIAMINE HCL 100 MG PO TABS
100.0000 mg | ORAL_TABLET | Freq: Every day | ORAL | Status: DC
  Administered 2021-03-12 – 2021-04-05 (×25): 100 mg via ORAL
  Filled 2021-03-11 (×25): qty 1

## 2021-03-11 MED ORDER — DEXAMETHASONE 4 MG PO TABS
4.0000 mg | ORAL_TABLET | Freq: Three times a day (TID) | ORAL | Status: DC
  Administered 2021-03-12 – 2021-04-05 (×73): 4 mg via ORAL
  Filled 2021-03-11 (×74): qty 1

## 2021-03-11 MED ORDER — CARVEDILOL 3.125 MG PO TABS
3.1250 mg | ORAL_TABLET | Freq: Two times a day (BID) | ORAL | Status: DC
  Administered 2021-03-12 – 2021-03-15 (×8): 3.125 mg via ORAL
  Filled 2021-03-11 (×8): qty 1

## 2021-03-11 NOTE — H&P (Addendum)
History and Physical    Brenda [name] IWP:[id number] DOB: Dec 31, 1960 DOA: 03/11/2021  PCP: Pcp, No   Patient coming from:  Home  I have personally briefly reviewed patient's old medical records in [place]  Chief Complaint:  Recurrent Falls  HPI: Brenda [name] is a 60 y.o. female with PMH significant for stage IV lung CA, used to follow-up with Dr. Julien Nordmann, CHF,  tobacco abuse, EtOH abuse, hypoalbuminemia, polysubstance abuse presented to the ED with complaints of recurrent falls.  Patient used to follow-up with Dr. Julien Nordmann but she has lost follow-up for few months, Patient reports she is living with her niece and reports recurrent falls.  She has fallen 3 days ago from the toilet and has hurt her left chest, states has developed left chest pain, Patient also reports generalized weakness associated with decreased p.o. intake, headache dizziness.  Patient denies any fever, chills, cough, sore throat, nausea, vomiting, diarrhea, sick contacts, recent travel. Patient is accompanied by fianc who reports patient has been confused for last few days.  ED Course: She is hemodynamically stable except hypertension. Vitals: Temp 98, HR 94, RR 18, BP 152/97, SPO2 98% on room air Labs include 132, potassium 4.0, chloride 103, bicarb 20, glucose 102, BUN 14, creatinine 1.92, calcium 9.0, anion gap 9, alkaline phosphatase 195, albumin 2.9, AST 24, ALT 15, ammonia 27, total bilirubin 0.7, lactic acid 1.9, WBC 4.2, hemoglobin 13.3, hematocrit 39.9, platelet 233, influenza negative, COVID-negative. CT head: Two solid and cystic lesions in the right cerebral hemisphere, the largest measuring up to 6.4 cm, are new since 08/10/2020, concerning for metastatic disease. There is regional mass effect caused by the larger lesion with partial effacement of the right lateral ventricle but no midline shift. Brain MRI with and without contrast is recommended for further characterization. CXR: A right upper lobe  partially calcified mass appears increased in size as compared to the prior chest radiograph 08/15/2020, now measuring at least 3.4 cm. A smaller nodule within the medial rightupper lobe also appears more conspicuous.  Moderate left pleural effusion. X-ray ribs:No acute, displaced left rib fracture is identified   Review of Systems: Review of Systems  Constitutional:  Positive for malaise/fatigue and weight loss.  HENT: Negative.    Eyes: Negative.   Respiratory:  Positive for shortness of breath.   Cardiovascular:  Positive for chest pain.  Gastrointestinal: Negative.   Genitourinary: Negative.   Musculoskeletal:  Positive for falls.  Skin: Negative.   Neurological:  Positive for dizziness, weakness and headaches.  Endo/Heme/Allergies: Negative.   Psychiatric/Behavioral:  The patient is nervous/anxious.     Past Medical History:  Diagnosis Date   Breast cancer ([place])    Depression    Mental disorder     Past Surgical History:  Procedure Laterality Date   ESOPHAGOGASTRODUODENOSCOPY (EGD) WITH PROPOFOL N/A 04/09/2020   Procedure: ESOPHAGOGASTRODUODENOSCOPY (EGD) WITH PROPOFOL;  Surgeon: Doran Stabler, MD;  Location: [facility];  Service: Gastroenterology;  Laterality: N/A;   IR IMAGING GUIDED PORT INSERTION  11/03/2019   TUBAL LIGATION     VIDEO BRONCHOSCOPY WITH ENDOBRONCHIAL NAVIGATION N/A 04/04/2019   Procedure: VIDEO BRONCHOSCOPY WITH ENDOBRONCHIAL NAVIGATION;  Surgeon: Garner Nash, DO;  Location: [facility];  Service: Thoracic;  Laterality: N/A;   VIDEO BRONCHOSCOPY WITH ENDOBRONCHIAL ULTRASOUND N/A 04/04/2019   Procedure: VIDEO BRONCHOSCOPY WITH ENDOBRONCHIAL ULTRASOUND;  Surgeon: Garner Nash, DO;  Location: [facility];  Service: Thoracic;  Laterality: N/A;     reports that she quit smoking about  22 months ago. Her smoking use included cigarettes. She has a 15.00 pack-year smoking history. She has never used smokeless tobacco. She reports current alcohol use of about 84.0  standard drinks per week. She reports current drug use. Drug: Cocaine.  Allergies  Allergen Reactions   Aspirin Adult Low [Aspirin] Other (See Comments)    Stomach upset    Family History  Problem Relation Age of Onset   Cancer Mother    Heart attack Father        thinks he had MI in his 58s; died from saw accident    Family history reviewed and not pertinent.  Prior to Admission medications   Medication Sig Start Date End Date Taking? Authorizing Provider  acetaminophen (TYLENOL) 325 MG tablet Take 2 tablets (650 mg total) by mouth every 6 (six) hours as needed for mild pain, moderate pain or headache. 04/11/20   Domenic Polite, MD  carvedilol (COREG) 3.125 MG tablet Take 1 tablet (3.125 mg total) by mouth 2 (two) times daily with a meal. 08/16/20   Eugenie Filler, MD  ferrous sulfate 325 (65 FE) MG tablet TAKE 1 TABLET (325 MG TOTAL) BY MOUTH 2 (TWO) TIMES DAILY WITH A MEAL. Patient taking differently: Take 325 mg by mouth 2 (two) times daily with a meal. 04/11/20 04/11/21  Domenic Polite, MD  folic acid (FOLVITE) 1 MG tablet TAKE 1 TABLET (1 MG TOTAL) BY MOUTH DAILY. Patient taking differently: Take 1 mg by mouth daily. 04/11/20 04/11/21  Domenic Polite, MD  gabapentin (NEURONTIN) 300 MG capsule Take 1 capsule (300 mg total) by mouth at bedtime. 06/11/20   Tanner, Lyndon Code., PA-C  lidocaine-prilocaine (EMLA) cream Apply 1 application topically as needed. Patient taking differently: Apply 1 application topically as needed (port access). 10/19/19   Heilingoetter, Cassandra L, PA-C  nicotine (NICODERM CQ - DOSED IN MG/24 HOURS) 21 mg/24hr patch Place 1 patch (21 mg total) onto the skin daily. 08/17/20   Eugenie Filler, MD  pantoprazole (PROTONIX) 40 MG tablet Take 1 tablet (40 mg total) by mouth 2 (two) times daily. 08/16/20 08/16/21  Eugenie Filler, MD  polyethylene glycol (MIRALAX / GLYCOLAX) 17 g packet Dissolve 1 packet (17 g) in liquid and drink 2 (two) times daily. 07/26/20    Arrien, Jimmy Picket, MD  thiamine 100 MG tablet Take 1 tablet (100 mg total) by mouth daily. 08/16/20   Eugenie Filler, MD  prochlorperazine (COMPAZINE) 10 MG tablet Take 1 tablet (10 mg total) by mouth every 6 (six) hours as needed for nausea or vomiting. Patient not taking: Reported on 04/08/2020 01/17/20 04/11/20  Heilingoetter, Tobe Sos, PA-C    Physical Exam: Vitals:   03/25/21 2118 03/26/21 0433 03/26/21 1452 03/26/21 1453  BP: (!) 166/103 124/85 (!) 147/85 (!) 147/85  Pulse: (!) 102 82 95 95  Resp: 18 18  16   Temp: 98.1 F (36.7 C) 97.6 F (36.4 C) 98.5 F (36.9 C) 98.5 F (36.9 C)  TempSrc: Oral Oral Oral Oral  SpO2: 98% 97% 97% 97%  Weight:      Height:        Constitutional: Appears comfortable, chronically ill looking, deconditioned, poor hygiene. Vitals:   03/25/21 2118 03/26/21 0433 03/26/21 1452 03/26/21 1453  BP: (!) 166/103 124/85 (!) 147/85 (!) 147/85  Pulse: (!) 102 82 95 95  Resp: 18 18  16   Temp: 98.1 F (36.7 C) 97.6 F (36.4 C) 98.5 F (36.9 C) 98.5 F (36.9 C)  TempSrc: Oral Oral  Oral Oral  SpO2: 98% 97% 97% 97%  Weight:      Height:       Eyes: PERRL, lids and conjunctivae normal ENMT: Mucous membranes are moist.  Posterior pharynx without exudate. Poor dentition.  Neck: normal, supple, no masses, no thyromegaly Respiratory: Decreased breath sounds on right side, no wheezing no crackles.  Respiratory effort normal. Cardiovascular: S1-S2 heard, regular rate and rhythm, no murmur. Chest: tenderness noted in the left lower chest Abdomen: Abdomen is firm, tenderness+, no guarding, no rigidity.  BS+ Musculoskeletal: No clubbing, no cyanosis, no edema.  Generalized wasting noted  Normal muscle tone.  Skin: no rashes, lesions, ulcers. No induration Neurologic: CN 2-12 grossly intact. Sensation intact, DTR normal.  Psychiatric: Normal judgment and insight. Alert and oriented x 3. Normal mood.     Labs on Admission: I have personally  reviewed following labs and imaging studies  CBC: Recent Labs  Lab 03/20/21 0505  WBC 5.3  NEUTROABS 3.4  HGB 9.6*  HCT 29.8*  MCV 106.0*  PLT 035   Basic Metabolic Panel: Recent Labs  Lab 03/20/21 0505 03/21/21 0517 03/22/21 0551  NA 137 134* 135  K 5.5* 5.3* 5.1  CL 102 102 101  CO2 25 27 27   GLUCOSE 102* 126* 106*  BUN 86* 84* 81*  CREATININE 2.65* 2.58* 2.41*  CALCIUM 8.9 8.8* 8.8*   GFR: Estimated Creatinine Clearance: 17.6 mL/min (A) (by C-G formula based on SCr of 2.41 mg/dL (H)). Liver Function Tests: No results for input(s): AST, ALT, ALKPHOS, BILITOT, PROT, ALBUMIN in the last 168 hours.  No results for input(s): LIPASE, AMYLASE in the last 168 hours. No results for input(s): AMMONIA in the last 168 hours.  Coagulation Profile: No results for input(s): INR, PROTIME in the last 168 hours. Cardiac Enzymes: No results for input(s): CKTOTAL, CKMB, CKMBINDEX, TROPONINI in the last 168 hours. BNP (last 3 results) No results for input(s): PROBNP in the last 8760 hours. HbA1C: No results for input(s): HGBA1C in the last 72 hours. CBG: No results for input(s): GLUCAP in the last 168 hours. Lipid Profile: No results for input(s): CHOL, HDL, LDLCALC, TRIG, CHOLHDL, LDLDIRECT in the last 72 hours. Thyroid Function Tests: No results for input(s): TSH, T4TOTAL, FREET4, T3FREE, THYROIDAB in the last 72 hours. Anemia Panel: No results for input(s): VITAMINB12, FOLATE, FERRITIN, TIBC, IRON, RETICCTPCT in the last 72 hours. Urine analysis:    Component Value Date/Time   COLORURINE STRAW (A) 08/09/2020 1628   APPEARANCEUR CLEAR 08/09/2020 1628   LABSPEC 1.002 (L) 08/09/2020 1628   PHURINE 7.0 08/09/2020 1628   GLUCOSEU NEGATIVE 08/09/2020 1628   HGBUR SMALL (A) 08/09/2020 1628   BILIRUBINUR NEGATIVE 08/09/2020 1628   KETONESUR NEGATIVE 08/09/2020 1628   PROTEINUR NEGATIVE 08/09/2020 1628   UROBILINOGEN 0.2 05/25/2012 1958   NITRITE NEGATIVE 08/09/2020 1628    LEUKOCYTESUR NEGATIVE 08/09/2020 1628    Radiological Exams on Admission: No results found.  EKG: Independently reviewed.  EKG shows sinus rhythm  Assessment/Plan Principal Problem:   Primary malignant neoplasm of lung with metastasis to brain [facility]) Active Problems:   Major depressive disorder, recurrent episode with mood-congruent psychotic features ([place])   Alcohol abuse   Malignant neoplasm of right lung ([facility])   Polysubstance abuse ([facility])   Hypoalbuminemia   Acute on chronic combined systolic and diastolic congestive heart failure ([facility])   Protein-calorie malnutrition, severe   Recurrent falls: Patient presented with recurrent falls associated with headache, dizziness, generalized weakness, poor appetite. Patient reports fall  3 days ago from toilet , hurting left lower chest. Chest x-ray shows subacute left lower rib fracture. CT head shows new brain localized lesion consistent with metastasis. Discussed with oncologist Dr. Julien Nordmann recommended dexamethasone. Patient needs MRI brain for staging or clarification. PT and OT eval.  Moderate Left pleural effusion: Patient reports having thoracocentesis in the past. She reports shortness of breath on exertion.  She stays on room air. Obtain CT chest for more clarification.  Stage IV lung cancer with mets to Brain: Patient has stage IV lung cancer,  used to follow-up with Dr. Julien Nordmann. She has lost follow-up in last few months. ED physician has discussed with Dr. Julien Nordmann recommended metastatic work-up.  MRI brain, CT chest abdomen pelvis. Continue Decadron 4 mg 3 times daily.  Chronic diastolic and systolic CHF: Appears compensated, not in acute exacerbation. Last echocardiogram shows LVEF 40 to 45%. Not on ACE inhibitors due to worsening renal function  CKD stage IIIb: Serum creatinine at baseline. Baseline creatinine 1.9-2.7 Avoid nephrotoxic medications, continue to monitor.  Alcohol abuse: Advised to quit drinking  alcohol. Continue folic acid and thiamine.  Substance abuse: Patient reported using cocaine and other substances.. Obtain urine drug screen.  Left-sided rib fracture: Conservative management, continue pain control  Goals of care discussion: Palliative care consulted.  Patient needs realistic goals of care discussion. Patient wants to remain full code.   DVT prophylaxis: Heparin Code Status: Full code. Family Communication: No family at bed side. Disposition Plan:   Status is: Inpatient  Remains inpatient appropriate because: Presented with recurrent falls found to have metastatic brain disease with primary lung malignancy.  Requiring IV Decadron and oncology and metastatic work-up.   Consults called: [name] Admission status: Inpatient   Shawna Clamp MD [facility]   If 7PM-7AM, please contact night-coverage   03/26/2021, 4:27 PM

## 2021-03-11 NOTE — ED Notes (Signed)
Pt placed on purewick 

## 2021-03-11 NOTE — ED Triage Notes (Signed)
Patient reports not eating x 4 days due to food not tasting the same. Reports 2 falls over the past couple days- one in the bathtub and one over a rug in the house. C/o left sided rib cage pain and a headache.

## 2021-03-11 NOTE — ED Provider Notes (Signed)
Emergency Medicine Provider Triage Evaluation Note  Brenda [name] , a 60 y.o. female  was evaluated in triage.  Pt complains of weakness and falls. Hx of HIV, lung cancer, pancytopenia, low electoryltes. She has had loss of appetite, increased weakness, fell in the shower yesterday and hit left rib and and head. Hx of cancer ? confusion  Review of Systems  Positive: weakness Negative: sob  Physical Exam  BP (!) 152/97 (BP Location: Right Arm)    Pulse 94    Temp 98 F (36.7 C) (Oral)    Resp 18    Ht 5\' 6"  (1.676 m)    Wt 56.7 kg    LMP 01/19/2011    SpO2 98%    BMI 20.18 kg/m  Gen:   Awake, no distress   Resp:  Normal effort  MSK:   Moves extremities without difficulty  Other:  Ttp Left rib cage  Medical Decision Making  Medically screening exam initiated at 2:39 PM.  Appropriate orders placed.  Brenda [name] was informed that the remainder of the evaluation will be completed by another provider, this initial triage assessment does not replace that evaluation, and the importance of remaining in the ED until their evaluation is complete.  Work up [employer], [place], PA-C 03/11/21 1448    Lacretia Leigh, MD 03/12/21 [phone]

## 2021-03-11 NOTE — ED Provider Notes (Signed)
[facility] EMERGENCY DEPARTMENT Provider Note  CSN: [id number] Arrival date & time: 03/11/21 1407    History Chief Complaint  Patient presents with   Lytle Michaels    Brenda [name] is a 61 y.o. female with history of known Stage IV lung cancer has been lost to follow up since two admissions in the Spring of 2022. She is here today after having several falls at home where she lives with her niece and her niece's family. She has had weakness on her left side. Some confusion at home as well. She thought it had only been a few weeks since she was last here. She was also diagnosed with CHF and HIV during her most recent admission but has had no follow up for those conditions either.    Past Medical History:  Diagnosis Date   Breast cancer ([place])    Depression    Mental disorder     Past Surgical History:  Procedure Laterality Date   ESOPHAGOGASTRODUODENOSCOPY (EGD) WITH PROPOFOL N/A 04/09/2020   Procedure: ESOPHAGOGASTRODUODENOSCOPY (EGD) WITH PROPOFOL;  Surgeon: Doran Stabler, MD;  Location: [facility];  Service: Gastroenterology;  Laterality: N/A;   IR IMAGING GUIDED PORT INSERTION  11/03/2019   TUBAL LIGATION     VIDEO BRONCHOSCOPY WITH ENDOBRONCHIAL NAVIGATION N/A 04/04/2019   Procedure: VIDEO BRONCHOSCOPY WITH ENDOBRONCHIAL NAVIGATION;  Surgeon: Garner Nash, DO;  Location: [facility];  Service: Thoracic;  Laterality: N/A;   VIDEO BRONCHOSCOPY WITH ENDOBRONCHIAL ULTRASOUND N/A 04/04/2019   Procedure: VIDEO BRONCHOSCOPY WITH ENDOBRONCHIAL ULTRASOUND;  Surgeon: Garner Nash, DO;  Location: [facility];  Service: Thoracic;  Laterality: N/A;    Family History  Problem Relation Age of Onset   Cancer Mother    Heart attack Father        thinks he had MI in his 52s; died from saw accident     Social History   Tobacco Use   Smoking status: Former    Packs/day: 0.50    Years: 30.00    Pack years: 15.00    Types: Cigarettes    Quit date: 05/27/2019    Years since quitting: 1.7    Smokeless tobacco: Never  Vaping Use   Vaping Use: Never used  Substance Use Topics   Alcohol use: Yes    Alcohol/week: 84.0 standard drinks    Types: 84 Cans of beer per week   Drug use: Yes    Types: Cocaine     Home Medications Prior to Admission medications   Medication Sig Start Date End Date Taking? Authorizing Provider  acetaminophen (TYLENOL) 325 MG tablet Take 2 tablets (650 mg total) by mouth every 6 (six) hours as needed for mild pain, moderate pain or headache. 04/11/20   Domenic Polite, MD  carvedilol (COREG) 3.125 MG tablet Take 1 tablet (3.125 mg total) by mouth 2 (two) times daily with a meal. 08/16/20   Eugenie Filler, MD  ferrous sulfate 325 (65 FE) MG tablet TAKE 1 TABLET (325 MG TOTAL) BY MOUTH 2 (TWO) TIMES DAILY WITH A MEAL. Patient taking differently: Take 325 mg by mouth 2 (two) times daily with a meal. 04/11/20 04/11/21  Domenic Polite, MD  folic acid (FOLVITE) 1 MG tablet TAKE 1 TABLET (1 MG TOTAL) BY MOUTH DAILY. Patient taking differently: Take 1 mg by mouth daily. 04/11/20 04/11/21  Domenic Polite, MD  gabapentin (NEURONTIN) 300 MG capsule Take 1 capsule (300 mg total) by mouth at bedtime. 06/11/20   Harle Stanford., PA-C  lidocaine-prilocaine (  EMLA) cream Apply 1 application topically as needed. Patient taking differently: Apply 1 application topically as needed (port access). 10/19/19   Heilingoetter, Cassandra L, PA-C  nicotine (NICODERM CQ - DOSED IN MG/24 HOURS) 21 mg/24hr patch Place 1 patch (21 mg total) onto the skin daily. 08/17/20   Eugenie Filler, MD  pantoprazole (PROTONIX) 40 MG tablet Take 1 tablet (40 mg total) by mouth 2 (two) times daily. 08/16/20 08/16/21  Eugenie Filler, MD  polyethylene glycol (MIRALAX / GLYCOLAX) 17 g packet Dissolve 1 packet (17 g) in liquid and drink 2 (two) times daily. 07/26/20   Arrien, Jimmy Picket, MD  thiamine 100 MG tablet Take 1 tablet (100 mg total) by mouth daily. 08/16/20   Eugenie Filler, MD   prochlorperazine (COMPAZINE) 10 MG tablet Take 1 tablet (10 mg total) by mouth every 6 (six) hours as needed for nausea or vomiting. Patient not taking: Reported on 04/08/2020 01/17/20 04/11/20  Heilingoetter, Cassandra L, PA-C     Allergies    Aspirin adult low [aspirin]   Review of Systems   Review of Systems A comprehensive review of systems was completed and negative except as noted in HPI.    Physical Exam BP (!) 165/91    Pulse 92    Temp 97.8 F (36.6 C) (Oral)    Resp (!) 22    Ht 5\' 6"  (1.676 m)    Wt 56.7 kg    LMP 01/19/2011    SpO2 99%    BMI 20.18 kg/m   Physical Exam Vitals and nursing note reviewed.  Constitutional:      Appearance: Normal appearance.  HENT:     Head: Normocephalic.     Comments: Contusion L forehead    Nose: Nose normal.     Mouth/Throat:     Mouth: Mucous membranes are moist.  Eyes:     Extraocular Movements: Extraocular movements intact.     Conjunctiva/sclera: Conjunctivae normal.  Cardiovascular:     Rate and Rhythm: Normal rate.  Pulmonary:     Effort: Pulmonary effort is normal.     Breath sounds: Normal breath sounds.     Comments: Port in R upper chest Abdominal:     General: Abdomen is flat.     Palpations: Abdomen is soft.     Tenderness: There is no abdominal tenderness.  Musculoskeletal:        General: No swelling. Normal range of motion.     Cervical back: Neck supple.  Skin:    General: Skin is warm and dry.  Neurological:     Mental Status: She is alert and oriented to person, place, and time.     Cranial Nerves: No cranial nerve deficit.     Sensory: No sensory deficit.     Motor: Weakness (left arm and leg) present.  Psychiatric:        Mood and Affect: Mood normal.     ED Results / Procedures / Treatments   Labs (all labs ordered are listed, but only abnormal results are displayed) Labs Reviewed  CBC WITH DIFFERENTIAL/PLATELET - Abnormal; Notable for the following components:      Result Value   MCV  101.5 (*)    All other components within normal limits  COMPREHENSIVE METABOLIC PANEL - Abnormal; Notable for the following components:   Sodium 132 (*)    CO2 20 (*)    Glucose, Bld 102 (*)    Creatinine, Ser 1.93 (*)    Albumin 2.9 (*)  Alkaline Phosphatase 195 (*)    GFR, Estimated 30 (*)    All other components within normal limits  RESP PANEL BY RT-PCR (FLU A&B, COVID) ARPGX2  AMMONIA  LACTIC ACID, PLASMA  URINALYSIS, ROUTINE W REFLEX MICROSCOPIC    EKG EKG Interpretation  Date/Time:  Tuesday March 11 2021 14:25:42 EST Ventricular Rate:  94 PR Interval:  148 QRS Duration: 83 QT Interval:  358 QTC Calculation: 448 R Axis:   8 Text Interpretation: Sinus rhythm Probable left atrial enlargement Anterior infarct, old Since last tracing T wave inversion have resolved Confirmed by Calvert Cantor [phone]) on 03/11/2021 4:08:47 PM  Radiology DG Chest 2 View  Result Date: 03/11/2021 CLINICAL DATA:  Fall. Additional history provided: Patient reports not eating for 4 days, 2 falls over the past 2 days, left-sided rib pain and headache. EXAM: CHEST - 2 VIEW COMPARISON:  Prior chest radiographs 08/15/2020 and earlier. Chest CT 05/21/2020 FINDINGS: A right chest infusion port catheter is present with tip projecting at the level of the lower SVC. The cardiac silhouette is partially obscured, limiting evaluation of heart size. Moderate-sized left pleural effusion with associated left basilar atelectasis and/or consolidation. A partially calcified right upper lobe mass appears increased in size as compared to the prior chest radiograph of 08/15/2020, now measuring at least 3.4 cm. A smaller nodule within the medial right upper lobe also appears more conspicuous. No evidence of pneumothorax. No acute bony abnormality is identified. However, correlate with findings on concurrently performed radiographs of the left ribs. IMPRESSION: A right upper lobe partially calcified mass appears increased  in size as compared to the prior chest radiograph 08/15/2020, now measuring at least 3.4 cm. A smaller nodule within the medial right upper lobe also appears more conspicuous. A contrast-enhanced chest CT may be obtained for further evaluation, as clinically warranted. Moderate-sized left pleural effusion with associated left basilar atelectasis and/or consolidation. Electronically Signed   By: Kellie Simmering D.O.   On: 03/11/2021 15:44   DG Ribs Unilateral W/Chest Left  Result Date: 03/11/2021 CLINICAL DATA:  Provided history: Fall. EXAM: LEFT RIBS AND CHEST - 3+ VIEW COMPARISON:  Same day chest radiographs 03/11/2021. Prior chest radiographs 08/15/2020 and earlier. FINDINGS: No acute displaced left rib fracture is identified. There are multiple known chronic, healed left-sided rib fracture deformities. Please refer to the concurrently performed chest radiographs for a description of intrathoracic findings. IMPRESSION: No acute, displaced left rib fracture is identified. A CT of the thorax may be obtained for further evaluation, if clinically warranted. Electronically Signed   By: Kellie Simmering D.O.   On: 03/11/2021 15:59   CT Head Wo Contrast  Result Date: 03/11/2021 CLINICAL DATA:  Trauma, history of malignancy per the CT technologist. EXAM: CT HEAD WITHOUT CONTRAST TECHNIQUE: Contiguous axial images were obtained from the base of the skull through the vertex without intravenous contrast. COMPARISON:  Brain MRI 08/10/2020 FINDINGS: Brain: There is a new large solid and cystic lesion centered in the right centrum semiovale measuring 6.4 cm AP x 3.9 cm TV by 6.0 cm cc. There is a smaller new solid and cystic lesion in the right frontal lobe measuring 1.9 cm AP by 1.8 cm TV by 1.8 cm cc. The larger lesion exerts regional mass effect with mild perilesional edema resulting in partial effacement of the right lateral ventricle and effacement of the surrounding sulci. There is no measurable midline shift. No other  definite lesions are seen. There is no evidence of acute intracranial hemorrhage or extra-axial  fluid collection. 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 Vascular: No hyperdense vessel or unexpected calcification. Skull: Normal. Negative for fracture or focal lesion. Sinuses/Orbits: There is mucosal thickening in the left maxillary sinus, incompletely imaged. The globes and orbits are unremarkable. Other: None. IMPRESSION: Two solid and cystic lesions in the right cerebral hemisphere, the largest measuring up to 6.4 cm, are new since 08/10/2020, concerning for metastatic disease. There is regional mass effect caused by the larger lesion with partial effacement of the right lateral ventricle but no midline shift. Brain MRI with and without contrast is recommended for further characterization. These results were called by telephone at the time of interpretation on 03/11/2021 at 3:59 pm to provider Dr Karle Starch, who verbally acknowledged these results. Electronically Signed   By: Valetta Mole M.D.   On: 03/11/2021 16:00    Procedures .Critical Care Performed by: Truddie Hidden, MD Authorized by: Truddie Hidden, MD   Critical care provider statement:    Critical care time (minutes):  30   Critical care time was exclusive of:  Separately billable procedures and treating other patients   Critical care was necessary to treat or prevent imminent or life-threatening deterioration of the following conditions:  CNS failure or compromise   Critical care was time spent personally by me on the following activities:  Development of treatment plan with patient or surrogate, discussions with consultants, evaluation of patient's response to treatment, examination of patient, ordering and review of laboratory studies, ordering and review of radiographic studies, ordering and performing treatments and interventions, pulse oximetry, re-evaluation of patient's condition and review of old charts  Medications  Ordered in the ED Medications  dexamethasone (DECADRON) injection 10 mg (10 mg Intravenous Given 03/11/21 1655)     MDM Rules/Calculators/A&P MDM Patient with known metastatic lung cancer, lost to follow up, now here with weakness and falling at home. She had labs ordered in triage showing normal CBC, CMP with CKD improved from prior. Normal lactic acid. Spoke with Radiology and reviewed CT head showing likely metastatic disease with edema, no midline shift. Spoke with Dr. Julien Nordmann, Oncology, who recommends admission for CT CAP to re-stage. I also spoke with Dr. Rowe Pavy, Palliative Care, who will also consult during admission.   ED Course  I have reviewed the triage vital signs and the nursing notes.  Pertinent labs & imaging results that were available during my care of the patient were reviewed by me and considered in my medical decision making (see chart for details).  Clinical Course as of 03/11/21 1719  Tue Mar 11, 2021  1717 Covid/Flu are neg. Spoke with Dr Dwyane Dee, Hospitalist, who will evaluate for admission.  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

## 2021-03-11 NOTE — ED Notes (Addendum)
Pt to MRI,

## 2021-03-12 DIAGNOSIS — C3490 Malignant neoplasm of unspecified part of unspecified bronchus or lung: Secondary | ICD-10-CM | POA: Diagnosis not present

## 2021-03-12 DIAGNOSIS — F1010 Alcohol abuse, uncomplicated: Secondary | ICD-10-CM | POA: Diagnosis not present

## 2021-03-12 DIAGNOSIS — I5043 Acute on chronic combined systolic (congestive) and diastolic (congestive) heart failure: Secondary | ICD-10-CM | POA: Diagnosis not present

## 2021-03-12 DIAGNOSIS — F333 Major depressive disorder, recurrent, severe with psychotic symptoms: Secondary | ICD-10-CM

## 2021-03-12 DIAGNOSIS — C7931 Secondary malignant neoplasm of brain: Secondary | ICD-10-CM | POA: Diagnosis not present

## 2021-03-12 DIAGNOSIS — E43 Unspecified severe protein-calorie malnutrition: Secondary | ICD-10-CM | POA: Insufficient documentation

## 2021-03-12 DIAGNOSIS — E8809 Other disorders of plasma-protein metabolism, not elsewhere classified: Secondary | ICD-10-CM

## 2021-03-12 DIAGNOSIS — F1910 Other psychoactive substance abuse, uncomplicated: Secondary | ICD-10-CM

## 2021-03-12 LAB — COMPREHENSIVE METABOLIC PANEL
ALT: 12 U/L (ref 0–44)
AST: 17 U/L (ref 15–41)
Albumin: 2.4 g/dL — ABNORMAL LOW (ref 3.5–5.0)
Alkaline Phosphatase: 148 U/L — ABNORMAL HIGH (ref 38–126)
Anion gap: 9 (ref 5–15)
BUN: 17 mg/dL (ref 6–20)
CO2: 18 mmol/L — ABNORMAL LOW (ref 22–32)
Calcium: 8.5 mg/dL — ABNORMAL LOW (ref 8.9–10.3)
Chloride: 104 mmol/L (ref 98–111)
Creatinine, Ser: 1.95 mg/dL — ABNORMAL HIGH (ref 0.44–1.00)
GFR, Estimated: 29 mL/min — ABNORMAL LOW (ref >60)
Glucose, Bld: 113 mg/dL — ABNORMAL HIGH (ref 70–99)
Potassium: 3.8 mmol/L (ref 3.5–5.1)
Sodium: 131 mmol/L — ABNORMAL LOW (ref 135–145)
Total Bilirubin: 0.5 mg/dL (ref 0.3–1.2)
Total Protein: 6.6 g/dL (ref 6.5–8.1)

## 2021-03-12 LAB — CBC
HCT: 33.4 % — ABNORMAL LOW (ref 36.0–46.0)
Hemoglobin: 11.3 g/dL — ABNORMAL LOW (ref 12.0–15.0)
MCH: 34.1 pg — ABNORMAL HIGH (ref 26.0–34.0)
MCHC: 33.8 g/dL (ref 30.0–36.0)
MCV: 100.9 fL — ABNORMAL HIGH (ref 80.0–100.0)
Platelets: 211 10*3/uL (ref 150–400)
RBC: 3.31 MIL/uL — ABNORMAL LOW (ref 3.87–5.11)
RDW: 14 % (ref 11.5–15.5)
WBC: 3.5 10*3/uL — ABNORMAL LOW (ref 4.0–10.5)
nRBC: 0 % (ref 0.0–0.2)

## 2021-03-12 LAB — PHOSPHORUS: Phosphorus: 3.9 mg/dL (ref 2.5–4.6)

## 2021-03-12 LAB — MAGNESIUM: Magnesium: 2.2 mg/dL (ref 1.7–2.4)

## 2021-03-12 MED ORDER — ADULT MULTIVITAMIN W/MINERALS CH
1.0000 | ORAL_TABLET | Freq: Every day | ORAL | Status: DC
  Administered 2021-03-12 – 2021-04-05 (×25): 1 via ORAL
  Filled 2021-03-12 (×25): qty 1

## 2021-03-12 MED ORDER — ENSURE ENLIVE PO LIQD
237.0000 mL | Freq: Three times a day (TID) | ORAL | Status: DC
Start: 2021-03-12 — End: 2021-04-04
  Administered 2021-03-12 – 2021-04-04 (×59): 237 mL via ORAL

## 2021-03-12 NOTE — Progress Notes (Signed)
PROGRESS NOTE    Brenda [name]  XYB:[id number] DOB: [birth year] DOA: 03/11/2021 PCP: Pcp, No   Brief Narrative: Brenda [name] is a 60 y.o. female with PMH significant for stage IV lung CA, used to follow-up with Dr. Julien Nordmann, CHF, tobacco abuse, EtOH abuse, hypoalbuminemia, polysubstance abuse. Patient presented secondary to recurrent falls likely secondary to known metastatic lung cancer with brain metastasis. Decadron started. PT ordered. Palliative care consulted.   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 Possibly related to metastatic cancer diagnosis. Started empirically on Decadron -PT eval  Left pleural effusion In setting of known stage IV lung cancer. Currently on room air.  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 No abdominal metastasis noted on non-contrast CT. Oncology consulted.  Chronic combined systolic and diastolic heart failure Stable. -Continue Coreg  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 Test positive in January of 2021 with reactive PCR. Negative viral load. Repeat PCR non-reactive.  CKD stage IIIb Baseline creatinine of about 2. Stable.  Alcohol abuse -Continue folic acid, thiamine -Start CIWA  Polysubstance abuse History of cocaine use.   DVT prophylaxis: Heparin  Code Status:   Code Status: Full Code Family Communication: None at bedside Disposition Plan: Discharge pending goals of care, PT recommendations   Consultants:   Medical oncology Palliative care  Procedures:  None  Antimicrobials: None    Subjective: No issues this morning.  Objective: Vitals:   03/12/21 0430 03/12/21 0432 03/12/21 0959 03/12/21 1615  BP:  (!) 145/92 123/86 129/80  Pulse:  80 96 98  Resp:  17 18 16   Temp:  97.8 F (36.6 C) 97.6 F (36.4 C) 98.3 F (36.8 C)  TempSrc:  Oral Oral Oral  SpO2:  97% 100% 97%  Weight: 43.8 kg     Height: 5\' 6"  (1.676 m)       Intake/Output Summary (Last 24 hours) at 03/12/2021 1813 Last data filed at 03/12/2021 1500 Gross per 24 hour  Intake 861.34 ml  Output --  Net 861.34 ml   Filed Weights   03/11/21 1432 03/12/21 0430  Weight: 56.7 kg 43.8 kg    Examination:  General exam: Appears calm and comfortable Respiratory system: Clear to auscultation. Respiratory effort normal. Cardiovascular system: S1 & S2 heard, RRR. No murmurs, rubs, gallops or clicks. Gastrointestinal system: Abdomen is nondistended, soft and nontender. No organomegaly or masses felt. Normal bowel sounds heard. Central nervous system: Alert and oriented. No focal neurological deficits. Musculoskeletal: No calf tenderness Skin: No cyanosis. No rashes Psychiatry: Judgement and insight appear normal. Mood & affect appropriate.     Data Reviewed: I have personally reviewed following labs and imaging studies  CBC Lab Results  Component Value Date   WBC 3.5 (L) 03/12/2021   RBC 3.31 (L) 03/12/2021   HGB 11.3 (L) 03/12/2021   HCT 33.4 (L) 03/12/2021   MCV 100.9 (H) 03/12/2021   MCH 34.1 (H) 03/12/2021   PLT 211 03/12/2021   MCHC 33.8 03/12/2021   RDW 14.0 03/12/2021   LYMPHSABS 1.3 03/11/2021  MONOABS 0.5 03/11/2021   EOSABS 0.0 03/11/2021   BASOSABS 0.0 78/29/5621     Last metabolic panel Lab Results  Component Value Date   NA 131 (L) 03/12/2021   K 3.8 03/12/2021   CL 104 03/12/2021   CO2 18 (L) 03/12/2021   BUN 17 03/12/2021   CREATININE 1.95 (H) 03/12/2021   GLUCOSE 113 (H)  03/12/2021   GFRNONAA 29 (L) 03/12/2021   GFRAA  07/01/2020    QUESTIONABLE RESULTS, RECOMMEND RECOLLECT TO VERIFY   CALCIUM 8.5 (L) 03/12/2021   PHOS 3.9 03/12/2021   PROT 6.6 03/12/2021   ALBUMIN 2.4 (L) 03/12/2021   BILITOT 0.5 03/12/2021   ALKPHOS 148 (H) 03/12/2021   AST 17 03/12/2021   ALT 12 03/12/2021   ANIONGAP 9 03/12/2021    CBG (last 3)  No results for input(s): GLUCAP in the last 72 hours.   GFR: Estimated Creatinine Clearance: 21.7 mL/min (A) (by C-G formula based on SCr of 1.95 mg/dL (H)).  Coagulation Profile: No results for input(s): INR, PROTIME in the last 168 hours.  Recent Results (from the past 240 hour(s))  Resp Panel by RT-PCR (Flu A&B, Covid) Nasopharyngeal Swab     Status: None   Collection Time: 03/11/21  3:17 PM   Specimen: Nasopharyngeal Swab; Nasopharyngeal(NP) swabs in vial transport medium  Result Value Ref Range Status   SARS Coronavirus 2 by RT PCR NEGATIVE NEGATIVE Final    Comment: (NOTE) SARS-CoV-2 target nucleic acids are NOT DETECTED.  The SARS-CoV-2 RNA is generally detectable in upper respiratory specimens during the acute phase of infection. The lowest concentration of SARS-CoV-2 viral copies this assay can detect is 138 copies/mL. A negative result does not preclude SARS-Cov-2 infection and should not be used as the sole basis for treatment or other patient management decisions. A negative result may occur with  improper specimen collection/handling, submission of specimen other than nasopharyngeal swab, presence of viral mutation(s) within the areas targeted by this assay, and inadequate number of viral copies(<138 copies/mL). A negative result must be combined with clinical observations, patient history, and epidemiological information. The expected result is Negative.  Fact Sheet for Patients:  EntrepreneurPulse.com.au  Fact Sheet for Healthcare Providers:   IncredibleEmployment.be  This test is no t yet approved or cleared by the [place] FDA and  has been authorized for detection and/or diagnosis of SARS-CoV-2 by FDA under an Emergency Use Authorization (EUA). This EUA will remain  in effect (meaning this test can be used) for the duration of the COVID-19 declaration under Section 564(b)(1) of the Act, 21 U.S.C.section 360bbb-3(b)(1), unless the authorization is terminated  or revoked sooner.       Influenza A by PCR NEGATIVE NEGATIVE Final   Influenza B by PCR NEGATIVE NEGATIVE Final    Comment: (NOTE) The Xpert Xpress SARS-CoV-2/FLU/RSV plus assay is intended as an aid in the diagnosis of influenza from Nasopharyngeal swab specimens and should not be used as a sole basis for treatment. Nasal washings and aspirates are unacceptable for Xpert Xpress SARS-CoV-2/FLU/RSV testing.  Fact Sheet for Patients: EntrepreneurPulse.com.au  Fact Sheet for Healthcare Providers: IncredibleEmployment.be  This test is not yet approved or cleared by the [place] FDA and has been authorized for detection and/or diagnosis of SARS-CoV-2 by FDA under an Emergency Use Authorization (EUA). This EUA will remain in effect (meaning this test can be used) for the duration of the COVID-19 declaration under Section 564(b)(1) of the Act, 21 U.S.C. section 360bbb-3(b)(1), unless the authorization is  terminated or revoked.  Performed at [facility], [place] [address] [place], [place] [address]         Radiology Studies: DG Chest 2 View  Result Date: 03/11/2021 CLINICAL DATA:  Fall. Additional history provided: Patient reports not eating for 4 days, 2 falls over the past 2 days, left-sided rib pain and headache. EXAM: CHEST - 2 VIEW COMPARISON:  Prior chest radiographs 08/15/2020 and earlier. Chest CT 05/21/2020 FINDINGS: A right chest infusion port catheter is present with  tip projecting at the level of the lower SVC. The cardiac silhouette is partially obscured, limiting evaluation of heart size. Moderate-sized left pleural effusion with associated left basilar atelectasis and/or consolidation. A partially calcified right upper lobe mass appears increased in size as compared to the prior chest radiograph of 08/15/2020, now measuring at least 3.4 cm. A smaller nodule within the medial right upper lobe also appears more conspicuous. No evidence of pneumothorax. No acute bony abnormality is identified. However, correlate with findings on concurrently performed radiographs of the left ribs. IMPRESSION: A right upper lobe partially calcified mass appears increased in size as compared to the prior chest radiograph 08/15/2020, now measuring at least 3.4 cm. A smaller nodule within the medial right upper lobe also appears more conspicuous. A contrast-enhanced chest CT may be obtained for further evaluation, as clinically warranted. Moderate-sized left pleural effusion with associated left basilar atelectasis and/or consolidation. Electronically Signed   By: Kellie Simmering D.O.   On: 03/11/2021 15:44   DG Ribs Unilateral W/Chest Left  Result Date: 03/11/2021 CLINICAL DATA:  Provided history: Fall. EXAM: LEFT RIBS AND CHEST - 3+ VIEW COMPARISON:  Same day chest radiographs 03/11/2021. Prior chest radiographs 08/15/2020 and earlier. FINDINGS: No acute displaced left rib fracture is identified. There are multiple known chronic, healed left-sided rib fracture deformities. Please refer to the concurrently performed chest radiographs for a description of intrathoracic findings. IMPRESSION: No acute, displaced left rib fracture is identified. A CT of the thorax may be obtained for further evaluation, if clinically warranted. Electronically Signed   By: Kellie Simmering D.O.   On: 03/11/2021 15:59   CT Head Wo Contrast  Result Date: 03/11/2021 CLINICAL DATA:  Trauma, history of malignancy per the  CT technologist. EXAM: CT HEAD WITHOUT CONTRAST TECHNIQUE: Contiguous axial images were obtained from the base of the skull through the vertex without intravenous contrast. COMPARISON:  Brain MRI 08/10/2020 FINDINGS: Brain: There is a new large solid and cystic lesion centered in the right centrum semiovale measuring 6.4 cm AP x 3.9 cm TV by 6.0 cm cc. There is a smaller new solid and cystic lesion in the right frontal lobe measuring 1.9 cm AP by 1.8 cm TV by 1.8 cm cc. The larger lesion exerts regional mass effect with mild perilesional edema resulting in partial effacement of the right lateral ventricle and effacement of the surrounding sulci. There is no measurable midline shift. No other definite lesions are seen. There is no evidence of acute intracranial hemorrhage or extra-axial fluid collection. 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 Vascular: No hyperdense vessel or unexpected calcification. Skull: Normal. Negative for fracture or focal lesion. Sinuses/Orbits: There is mucosal thickening in the left maxillary sinus, incompletely imaged. The globes and orbits are unremarkable. Other: None. IMPRESSION: Two solid and cystic lesions in the right cerebral hemisphere, the largest measuring up to 6.4 cm, are new since 08/10/2020, concerning for metastatic disease. There is regional mass effect caused by the larger lesion  with partial effacement of the right lateral ventricle but no midline shift. Brain MRI with and without contrast is recommended for further characterization. These results were called by telephone at the time of interpretation on 03/11/2021 at 3:59 pm to provider Dr Karle Starch, who verbally acknowledged these results. Electronically Signed   By: Valetta Mole M.D.   On: 03/11/2021 16:00   MR BRAIN W WO CONTRAST  Result Date: 03/11/2021 CLINICAL DATA:  Lung cancer. Abnormal CT head today. Rule out metastatic disease. EXAM: MRI HEAD WITHOUT AND WITH CONTRAST TECHNIQUE: Multiplanar,  multiecho pulse sequences of the brain and surrounding structures were obtained without and with intravenous contrast. CONTRAST:  67mL GADAVIST GADOBUTROL 1 MMOL/ML IV SOLN COMPARISON:  CT head 03/11/2021 FINDINGS: Brain: Motion degraded study Multiple enhancing lesions compatible with metastatic disease. 6.6 x 3.7 cm cystic mass in the right occipital parietal lobe. Much of this is cystic however there is enhancing tumor around the periphery of the mass as well as hemorrhage within the lesion. Mild surrounding edema and local mass-effect. 20 mm cystic and enhancing lesion in the right frontal lobe with mild edema and mild hemorrhage. This also is consistent with metastatic disease. Small focus of restricted diffusion right anterior temporal lobe without definite enhancement. This could be a recent infarct versus metastatic disease. Enhancement could be obscured by motion. 6 mm enhancing lesion left frontal lobe consistent metastatic disease. See sagittal image 18 6 mm lesions in the left lateral frontal lobe and left operculum consistent with metastatic disease. Sagittal image 21. Possible 3 mm enhancing lesion left temporal lobe. 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 Negative for hydrocephalus. Mild midline shift to the left. Vascular: Normal arterial flow voids Skull and upper cervical spine: No skeletal metastasis. Contusion left frontal scalp from trauma. Sinuses/Orbits: Mucosal edema in the paranasal sinuses most prominent left maxillary sinus. Negative orbit Other: None IMPRESSION: Findings consistent with widespread metastatic disease to brain. Large cystic mass in the right occipital parietal lobe with prior hemorrhage and local mass-effect. Multiple additional metastatic lesions as above. Left frontal scalp contusion compatible with recent trauma. Electronically Signed   By: Franchot Gallo M.D.   On: 03/11/2021 19:37   CT CHEST ABDOMEN PELVIS WO CONTRAST  Result Date:  03/11/2021 CLINICAL DATA:  Metastatic disease evaluation. EXAM: CT CHEST, ABDOMEN AND PELVIS WITHOUT CONTRAST TECHNIQUE: Multidetector CT imaging of the chest, abdomen and pelvis was performed following the standard protocol without IV contrast. COMPARISON:  CT dated 05/21/2020. FINDINGS: Evaluation of this exam is limited in the absence of intravenous contrast. CT CHEST FINDINGS Cardiovascular: No cardiomegaly or pericardial effusion. There is coronary vascular calcification. Right-sided Port-A-Cath with tip at the cavoatrial junction. The thoracic aorta and central pulmonary arteries are grossly unremarkable on this noncontrast CT. Mediastinum/Nodes: No hilar or mediastinal adenopathy. The esophagus is grossly unremarkable. No mediastinal fluid collection. Lungs/Pleura: Moderate size left pleural effusion slightly increased since the prior CT. There is partial compressive atelectasis of the left lung versus pneumonia. Background of emphysema. Interval increase in the size of the dominant right upper lobe mass measuring 3 x 3 cm (previously 2 x 2 cm). A 1.2 x 0.8 cm nodular density with spiculated margins adjacent to the dominant mass is similar to prior CT. Additionally there is a 1.9 x 2.7 cm pleural based nodule with irregular/spiculated margin from the posteromedial right upper lobe similar or minimally increased since the prior CT. There is trace right pleural effusion. No pneumothorax. The central airways are  patent. Musculoskeletal: Old healed bilateral posterior rib fractures. No acute osseous pathology. CT ABDOMEN PELVIS FINDINGS No intra-abdominal free air or free fluid. Hepatobiliary: The liver is unremarkable. No intrahepatic biliary dilatation. Multiple gallstones. No pericholecystic fluid or evidence of acute cholecystitis by CT. Pancreas: Unremarkable. No pancreatic ductal dilatation or surrounding inflammatory changes. Spleen: Normal in size without focal abnormality. Adrenals/Urinary Tract: The  adrenal glands unremarkable. The kidneys, visualized ureters, and urinary bladder appear unremarkable. Stomach/Bowel: There is no bowel obstruction or active inflammation. The appendix is normal. Vascular/Lymphatic: Moderate aortoiliac atherosclerotic disease. The IVC is unremarkable. No portal venous gas. There is no adenopathy. Reproductive: The uterus and ovaries are grossly unremarkable. No pelvic masses Other: Several small fat containing supraumbilical hernia. Musculoskeletal: Osteopenia.  No acute osseous pathology. IMPRESSION: 1. Interval increase in the size of the dominant right upper lobe mass. 2. Slight interval increase in the size of the pleural based nodule in the posteromedial right upper lobe. 3. Moderate size left pleural effusion slightly increased since the prior CT. There is partial compressive atelectasis of the left lung versus pneumonia. 4. Cholelithiasis. 5. No evidence of metastatic disease in the abdomen or pelvis. 6. Aortic Atherosclerosis (ICD10-I70.0) and Emphysema (ICD10-J43.9). Electronically Signed   By: Anner Crete M.D.   On: 03/11/2021 22:25        Scheduled Meds:  carvedilol  3.125 mg Oral BID WC   dexamethasone  4 mg Oral Q8H   feeding supplement  237 mL Oral TID BM   ferrous sulfate  325 mg Oral BID WC   gabapentin  300 mg Oral QHS   heparin  5,000 Units Subcutaneous Q8H   multivitamin with minerals  1 tablet Oral Daily   nicotine  14 mg Transdermal Daily   pantoprazole  40 mg Oral BID   polyethylene glycol  17 g Oral BID   thiamine  100 mg Oral Daily   Continuous Infusions:  sodium chloride 50 mL/hr at 03/12/21 0557     LOS: 1 day     Cordelia Poche, MD [facility] 03/12/2021, 6:13 PM  If 7PM-7AM, please contact night-coverage [email or web address]

## 2021-03-12 NOTE — Progress Notes (Signed)
Initial Nutrition Assessment  DOCUMENTATION CODES:   Severe malnutrition in context of chronic illness, Underweight  INTERVENTION:   -Ensure Enlive po TID, each supplement provides 350 kcal and 20 grams of protein  -Multivitamin with minerals daily  NUTRITION DIAGNOSIS:   Severe Malnutrition related to chronic illness, cancer and cancer related treatments as evidenced by percent weight loss, energy intake < or equal to 75% for > or equal to 1 month, mild fat depletion, moderate muscle depletion.  GOAL:   Patient will meet greater than or equal to 90% of their needs  MONITOR:   PO intake, Supplement acceptance, Labs, Weight trends, I & O's  REASON FOR ASSESSMENT:   Malnutrition Screening Tool    ASSESSMENT:   60 y.o. female with PMH significant for stage IV lung CA, used to follow-up with Dr. Julien Nordmann, CHF, HIV +,  tobacco abuse, EtOH abuse, hypoalbuminemia, polysubstance abuse presented to the ED with complaints of recurrent falls.  Patient reports having poor appetite for at least 4 days now PTA. Suspect poor PO for a while d/t polysubstance abuse. Pt having to eat soft foods d/t her poor dentition. States she has pulled some of her teeth out and she has chipped teeth from her fall. Pt ate bacon, oatmeal and toast this morning for breakfast. She is agreeable to drinking Ensure. 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   Per weight records, pt has lost 28 lbs since 5/27 (22% wt loss x 7 months, significant for time frame).   Medications: Ferrous sulfate, Miralax, Thiamine  Labs reviewed:  Low Na  NUTRITION - FOCUSED PHYSICAL EXAM:  Flowsheet Row Most Recent Value  Orbital Region No depletion  Upper Arm Region Severe depletion  Thoracic and Lumbar Region Mild depletion  Buccal Region Mild depletion  Temple Region No depletion  Clavicle Bone Region Moderate depletion  Clavicle and Acromion Bone Region Moderate depletion  Scapular Bone Region Moderate depletion  Dorsal  Hand Moderate depletion  Patellar Region Moderate depletion  Anterior Thigh Region Moderate depletion  Posterior Calf Region Moderate depletion  Edema (RD Assessment) None  Hair Reviewed  Eyes Reviewed  Mouth Reviewed  [poor dentition, has pulled teeth out herself]  Skin Reviewed  [dry]  Nails Reviewed       Diet Order:   Diet Order             DIET SOFT Room service appropriate? Yes; Fluid consistency: Thin  Diet effective now                   EDUCATION NEEDS:   Education needs have been addressed  Skin:  Skin Assessment: Reviewed RN Assessment  Last BM:  12/20  Height:   Ht Readings from Last 1 Encounters:  03/12/21 5\' 6"  (1.676 m)    Weight:   Wt Readings from Last 1 Encounters:  03/12/21 43.8 kg    BMI:  Body mass index is 15.59 kg/m.  Estimated Nutritional Needs:   Kcal:  2000-2200  Protein:  90-110g  Fluid:  2.2L/day  Clayton Bibles, MS, RD, LDN Inpatient Clinical Dietitian Contact information available via Amion

## 2021-03-13 DIAGNOSIS — I5043 Acute on chronic combined systolic (congestive) and diastolic (congestive) heart failure: Secondary | ICD-10-CM | POA: Diagnosis not present

## 2021-03-13 DIAGNOSIS — Z515 Encounter for palliative care: Secondary | ICD-10-CM

## 2021-03-13 DIAGNOSIS — F333 Major depressive disorder, recurrent, severe with psychotic symptoms: Secondary | ICD-10-CM | POA: Diagnosis not present

## 2021-03-13 DIAGNOSIS — E43 Unspecified severe protein-calorie malnutrition: Secondary | ICD-10-CM | POA: Diagnosis not present

## 2021-03-13 DIAGNOSIS — F1010 Alcohol abuse, uncomplicated: Secondary | ICD-10-CM | POA: Diagnosis not present

## 2021-03-13 DIAGNOSIS — C7931 Secondary malignant neoplasm of brain: Secondary | ICD-10-CM | POA: Diagnosis not present

## 2021-03-13 DIAGNOSIS — C3490 Malignant neoplasm of unspecified part of unspecified bronchus or lung: Secondary | ICD-10-CM | POA: Diagnosis not present

## 2021-03-13 NOTE — Consult Note (Signed)
Consultation Note Date: 03/13/2021   Patient Name: Brenda [name]  DOB: 1960/07/17  MRN: [id number]  Age / Sex: 60 y.o., female  PCP: Pcp, No Referring Physician: Mariel Aloe, MD  Reason for Consultation: Establishing goals of care  HPI/Patient Profile: 60 y.o. female  with past medical history of   stage IV lung CA, used to follow-up with Dr. Julien Nordmann, CHF, tobacco abuse, EtOH abuse, hypoalbuminemia, polysubstance abuse admitted on 03/11/2021 with recurrent falls. 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 Patient has not follow up with oncology since March 2022. She was admitted after recurrent falls at home and found to have brain mass on CT head that is new from May 2022. Dr. Earlie Server was consulted and recommended further imaging for staging of lung mass.   MRI brain revealed a 6.6x3.7 cm cystic mass in the R occipital lobe with other small lesions. CTAP revealed increased size of the lung mass but no metastasis to the abdomen. Palliative care consulted for goals of care conversation in the setting of new brain mets.   Clinical Assessment and Goals of Care: Patient was evaluated at the bedside sitting comfortably in chair waiting for lunch. States she feels much better and was able to ambulate in the hallway. Main complaint if left arm weakness and numbness.   Lives with her niece, [name], who is a Marine scientist at this hospital. Mostly independent with her ADLs. Son lives nearby and is involved in her care.   Reports she has not had any discussions with niece about advance directives. Remains full code as per her wishes, we discussed differences between full code versus DNR DNI in detail. Patient wishes to live with her niece towards the end of this hospitalization, she also states that she might have been sleeping when Dr  Earlie Server probably came by to see her, she wishes to discuss further with her oncologist abut MRI brain findings.    PATIENT    SUMMARY OF RECOMMENDATIONS   CODE STATUS and goals of care discussions undertaken with the patient who was awake alert sitting up in a chair.  CODE STATUS differences between full code versus DO NOT RESUSCITATE/DO NOT INTUBATE explored.  Patient will discuss further with her niece and son.  She did not indicate that she had further questions, remains full code for now. Patient states that she would like to discuss MRI brain findings with Dr. Julien Nordmann.  Briefly introduced to her about scope of palliative services.  Discussed about having palliative follow-up either at the cancer center or home-based palliative care going forward. Brief curbside discussion with Dr. Earlie Server on secure chat with regards to the patient's current condition as well as most recent MRI brain findings.  Patient unfortunately has been lost to follow-up, has not had regular follow-up at the cancer center.  Recommendations were made for referring the patient to radiation oncology, will notify [facility] MD. Thank you for the consult.  Code Status/Advance Care Planning: Full code  Symptom Management:  Continue current treatment  Palliative Prophylaxis:  Bowel Regimen  Additional Recommendations (Limitations, Scope, Preferences): Full Scope Treatment  Psycho-social/Spiritual:  Desire for further Chaplaincy support:yes Additional Recommendations: Caregiving  Support/Resources  Prognosis:  Unable to determine  Discharge Planning: To Be Determined      Primary Diagnoses: Present on Admission:  Primary malignant neoplasm of lung with metastasis to brain ([place])  Major depressive disorder, recurrent episode with mood-congruent psychotic features ([place])  Alcohol abuse  Malignant neoplasm of right lung ([facility])  Polysubstance abuse ([place])  Hypoalbuminemia  Acute on chronic combined systolic and  diastolic congestive heart failure ([place])   I have reviewed the medical record, interviewed the patient and family, and examined the patient. The following aspects are pertinent.  Past Medical History:  Diagnosis Date   Breast cancer ([place])    Depression    Mental disorder    Social History   Socioeconomic History   Marital status: Legally Separated    Spouse name: Not on file   Number of children: Not on file   Years of education: Not on file   Highest education level: Not on file  Occupational History   Not on file  Tobacco Use   Smoking status: Former    Packs/day: 0.50    Years: 30.00    Pack years: 15.00    Types: Cigarettes    Quit date: 05/27/2019    Years since quitting: 1.7   Smokeless tobacco: Never  Vaping Use   Vaping Use: Never used  Substance and Sexual Activity   Alcohol use: Yes    Alcohol/week: 84.0 standard drinks    Types: 84 Cans of beer per week   Drug use: Yes    Types: Cocaine   Sexual activity: Not on file  Other Topics Concern   Not on file  Social History Narrative   ** Merged History Encounter **       Social Determinants of Health   Financial Resource Strain: Not on file  Food Insecurity: Not on file  Transportation Needs: Not on file  Physical Activity: Not on file  Stress: Not on file  Social Connections: Not on file   Family History  Problem Relation Age of Onset   Cancer Mother    Heart attack Father        thinks he had MI in his 38s; died from saw accident    Scheduled Meds:  carvedilol  3.125 mg Oral BID WC   dexamethasone  4 mg Oral Q8H   feeding supplement  237 mL Oral TID BM   ferrous sulfate  325 mg Oral BID WC   gabapentin  300 mg Oral QHS   heparin  5,000 Units Subcutaneous Q8H   multivitamin with minerals  1 tablet Oral Daily   nicotine  14 mg Transdermal Daily   pantoprazole  40 mg Oral BID   polyethylene glycol  17 g Oral BID   thiamine  100 mg Oral Daily   Continuous Infusions:  sodium chloride 50 mL/hr  at 03/12/21 0557   PRN Meds:.acetaminophen **OR** acetaminophen, ondansetron **OR** ondansetron (ZOFRAN) IV, oxyCODONE Medications Prior to Admission:  Prior to Admission medications   Medication Sig Start Date End Date Taking? Authorizing Provider  acetaminophen (TYLENOL) 325 MG tablet Take 2 tablets (650 mg total) by mouth every 6 (six) hours as needed for mild pain, moderate pain or headache. Patient not taking: Reported on 03/12/2021 04/11/20   Domenic Polite, MD  carvedilol (COREG) 3.125 MG tablet  Take 1 tablet (3.125 mg total) by mouth 2 (two) times daily with a meal. Patient not taking: Reported on 03/12/2021 08/16/20   Eugenie Filler, MD  ferrous sulfate 325 (65 FE) MG tablet TAKE 1 TABLET (325 MG TOTAL) BY MOUTH 2 (TWO) TIMES DAILY WITH A MEAL. Patient not taking: Reported on 03/12/2021 04/11/20 04/11/21  Domenic Polite, MD  folic acid (FOLVITE) 1 MG tablet TAKE 1 TABLET (1 MG TOTAL) BY MOUTH DAILY. Patient not taking: Reported on 03/12/2021 04/11/20 04/11/21  Domenic Polite, MD  gabapentin (NEURONTIN) 300 MG capsule Take 1 capsule (300 mg total) by mouth at bedtime. Patient not taking: Reported on 03/12/2021 06/11/20   Harle Stanford., PA-C  pantoprazole (PROTONIX) 40 MG tablet Take 1 tablet (40 mg total) by mouth 2 (two) times daily. Patient not taking: Reported on 03/12/2021 08/16/20 08/16/21  Eugenie Filler, MD  polyethylene glycol (MIRALAX / GLYCOLAX) 17 g packet Dissolve 1 packet (17 g) in liquid and drink 2 (two) times daily. Patient not taking: Reported on 03/12/2021 07/26/20   Arrien, Jimmy Picket, MD  thiamine 100 MG tablet Take 1 tablet (100 mg total) by mouth daily. Patient not taking: Reported on 03/12/2021 08/16/20   Eugenie Filler, MD  prochlorperazine (COMPAZINE) 10 MG tablet Take 1 tablet (10 mg total) by mouth every 6 (six) hours as needed for nausea or vomiting. Patient not taking: Reported on 04/08/2020 01/17/20 04/11/20  Heilingoetter, Cassandra L, PA-C    Allergies  Allergen Reactions   Aspirin Adult Low [Aspirin] Other (See Comments)    Stomach upset   Review of Systems +L hand numb   Physical Exam Awake alert Clear breath sounds Has muscle wasting Has bruise on L forehead Abdomen not tender Both LE with skin discoloration  Vital Signs: BP 120/83 (BP Location: Right Arm)    Pulse 88    Temp 98.2 F (36.8 C) (Oral)    Resp 18    Ht 5\' 6"  (1.676 m)    Wt 43.8 kg    LMP 01/19/2011    SpO2 97%    BMI 15.59 kg/m  Pain Scale: 0-10   Pain Score: 0-No pain   SpO2: SpO2: 97 % O2 Device:SpO2: 97 % O2 Flow Rate: .   IO: Intake/output summary:  Intake/Output Summary (Last 24 hours) at 03/13/2021 1259 Last data filed at 03/13/2021 6811 Gross per 24 hour  Intake 941.98 ml  Output --  Net 941.98 ml    LBM: Last BM Date: 03/12/21 Baseline Weight: Weight: 56.7 kg Most recent weight: Weight: 43.8 kg     Palliative Assessment/Data:   PPS 60%  Time In: 12.30 Time Out:  1330 Time Total:  60  Greater than 50%  of this time was spent counseling and coordinating care related to the above assessment and plan.  Signed by: Loistine Chance, MD   Please contact Palliative Medicine Team phone at [phone] for questions and concerns.  For individual provider: See Shea Evans

## 2021-03-13 NOTE — Evaluation (Signed)
Occupational Therapy Evaluation Patient Details Name: Ceara Wrightson MRN: [id number] DOB: [birth year] Today's Date: 03/13/2021   History of Present Illness 60 years old female adm with AMS/confusion and falls. 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  Large cystic mass in the right occipital parietal lobe with prior  hemorrhage and local mass-effect. Multiple additional metastatic  lesions as above".  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 Pt refused recommended SNF level rehab following last hospital admit.   Clinical Impression   Patient is currently requiring assistance with ADLs including minimal assist with toileting, moderate assist with LE dressing, minimal to moderate assist with bathing, and minimal to moderate assist with UE dressing and bathing with Min As for grooming all performed seated.  Current level of function is below patient's typical baseline.  During this evaluation, patient was limited by generalized weakness, impaired activity tolerance, and LT sided weakness and LT inattention, decreased LT UE sensation and coordination, impaired cognition, impaired motor planning, and impaired safety awareness, all of which has the potential to impact patient's safety and independence during functional mobility, as well as performance for ADLs.  Patient lives with her fiance, who is able to provide 24/7 supervision and assistance.  Patient demonstrates fair rehab potential, and should benefit from continued skilled occupational therapy services while in acute care to maximize safety, independence and quality of life at home.  Continued occupational therapy services in a SNF setting prior to return home is recommended.  ?       Recommendations for follow up therapy are one component of a multi-disciplinary discharge planning process, led by  the attending physician.  Recommendations may be updated based on patient status, additional functional criteria and insurance authorization.   Follow Up Recommendations  Skilled nursing-short term rehab (<3 hours/day) (If pt continues to refuse SNF, recommend home health)    Assistance Recommended at Discharge Frequent or constant Supervision/Assistance  Functional Status Assessment  Patient has had a recent decline in their functional status and demonstrates the ability to make significant improvements in function in a reasonable and predictable amount of time.  Equipment Recommendations  Tub/shower bench    Recommendations for Other Services       Precautions / Restrictions Precautions Precautions: Fall Restrictions Weight Bearing Restrictions: No      Mobility Bed Mobility Overal bed mobility: Needs Assistance Bed Mobility: Supine to Sit     Supine to sit: Min guard;HOB elevated     General bed mobility comments: Cues for sequncing needed, increased time and effort for pt to bring self to EOB.    Transfers Overall transfer level: Needs assistance Equipment used: Rolling walker (2 wheels) Transfers: Sit to/from Stand;Bed to chair/wheelchair/BSC Sit to Stand: Min assist Stand pivot transfers: Max assist         General transfer comment: Pt with inability to motor plan to turn to chair with RW and required MAx As to safely descent to chair with OT rotating pt's hips enough to line up with chair safely. Increased time and effort to pivot.      Balance Overall balance assessment: History of Falls;Needs assistance   Sitting balance-Leahy Scale: Good     Standing balance support: Bilateral upper extremity supported;During functional activity;Reliant on assistive device for balance Standing balance-Leahy Scale: Poor  ADL either performed or assessed with clinical judgement   ADL Overall ADL's : Needs  assistance/impaired Eating/Feeding: Set up;Cueing for sequencing   Grooming: Sitting;Wash/dry face;Wash/dry hands;Set up;Supervision/safety;Cueing for sequencing   Upper Body Bathing: Minimal assistance;Sitting;Cueing for sequencing   Lower Body Bathing: Minimal assistance;Sitting/lateral leans   Upper Body Dressing : Minimal assistance;Sitting   Lower Body Dressing: Moderate assistance;Sitting/lateral leans Lower Body Dressing Details (indicate cue type and reason): Mod As to doff underwear on LT side. Pt performed RT side and ignored LT side despite verbal cues. Toilet Transfer: BSC/3in1;Ambulation;Rolling walker (2 wheels);Minimal assistance;Moderate assistance Toilet Transfer Details (indicate cue type and reason): Min-Mod As to ambulate to bathroom with LT LE dragging behind and remaining outside of RW despite frequent cues and assist. Toileting- Clothing Manipulation and Hygiene: Minimal assistance;Sitting/lateral lean;Cueing for sequencing Toileting - Clothing Manipulation Details (indicate cue type and reason): Required MAx Cues to attend to doffing underwear prior to sitting on toilet.     Functional mobility during ADLs: Minimal assistance;Min guard;Rolling walker (2 wheels);Cueing for sequencing;Cueing for safety       Vision Baseline Vision/History: 1 Wears glasses Ability to See in Adequate Light: 2 Moderately impaired Patient Visual Report: Peripheral vision impairment;Other (comment) Additional Comments: Showing L inattention. NEeds max cues to turn head and locate items on LT.     Perception Perception Perception: Impaired Inattention/Neglect: Does not attend to left visual field;Does not attend to left side of body   Praxis Praxis Praxis: Impaired Praxis Impairment Details: Ideomotor;Motor planning    Pertinent Vitals/Pain Pain Assessment: No/denies pain     Hand Dominance Right   Extremity/Trunk Assessment Upper Extremity Assessment Upper Extremity  Assessment: RUE deficits/detail;LUE deficits/detail RUE Deficits / Details: Generalized weakness ~3+/5 to 4-/5. RUE Sensation: WNL RUE Coordination: WNL LUE Deficits / Details: + downward drift on 10 sec test, inattention to LT side. PROM: WFL, AROM shoulder flex to ~ 110 then drifting down to ~75 degrees with 10 sec hold. Pt unaware. LUE Sensation: decreased light touch LUE Coordination: decreased fine motor;decreased gross motor   Lower Extremity Assessment Lower Extremity Assessment: Defer to PT evaluation       Communication Communication Communication: Other (comment)   Cognition Arousal/Alertness: Awake/alert Behavior During Therapy: Impulsive;Anxious Overall Cognitive Status: No family/caregiver present to determine baseline cognitive functioning                                 General Comments: Pt with decreased safety awareness, decreased insight, decreased motoro planning, decreased attention to taks and topic. Impaired problem solving.     General Comments       Exercises     Shoulder Instructions      Home Living Family/patient expects to be discharged to:: Skilled nursing facility (Pt is refusing rehab stating "he won't let me. It's personal.") Living Arrangements: Spouse/significant other                               Additional Comments: Pt reports that she lives with fiance and niece stays with them sometimes or pt stays with her.      Prior Functioning/Environment Prior Level of Function : History of Falls (last six months);Patient poor historian/Family not available             Mobility Comments: Pt reports having 2 wheelchairs (sounds like manual and Transport chair) and uses WC in home or furniture  cruises around her apartment about 50/50. Frequent falls. Pt will sometimes grab fiance by the neck to help stand up. ADLs Comments: Pt sponge bathes because unable to negotiate tub/shower, Fiance assists with bathing, and  dressing.  Pt performs own toilet hygiene, still cooks and cleans.        OT Problem List: Decreased strength;Decreased coordination;Decreased cognition;Impaired sensation;Decreased safety awareness;Decreased activity tolerance;Impaired balance (sitting and/or standing);Decreased knowledge of use of DME or AE;Impaired vision/perception;Decreased knowledge of precautions;Impaired UE functional use      OT Treatment/Interventions: Self-care/ADL training;Therapeutic exercise;Therapeutic activities;Neuromuscular education;Cognitive remediation/compensation;Visual/perceptual remediation/compensation;DME and/or AE instruction;Patient/family education;Balance training;Manual therapy    OT Goals(Current goals can be found in the care plan section) Acute Rehab OT Goals Patient Stated Goal: "I want my left hand to work. What's wrong with it?" OT Goal Formulation: With patient Time For Goal Achievement: 03/27/21 Potential to Achieve Goals: Fair ADL Goals Pt Will Perform Upper Body Dressing: sitting;with supervision (Dressing LT side first with no more than 1 verbal cue.) Pt Will Perform Lower Body Dressing: with min guard assist;sit to/from stand;sitting/lateral leans (Dressing LT side 1st with no more than 1 verbalcue.) Pt Will Transfer to Toilet: with supervision;ambulating (While keeping RW in appropriate position to feet and sequencing steps to turn for commode safely with verbal cues.) Pt Will Perform Toileting - Clothing Manipulation and hygiene: with supervision;sitting/lateral leans Pt/caregiver will Perform Home Exercise Program: Increased strength;Both right and left upper extremity;With Supervision;With minimal assist (General strenghtneing as well as LT side gross and fine motor exercises.) Additional ADL Goal #1: Pt will demonstrate improved mentation by scoring <4/10 on short blessed test and answering at least 3/4 safety questions from the [facility] correctly:   1. What do you do for yourself if  you are sick with a cold.    2. What do you do if you burn yourself and the wound becomes infected.    3. What do you do if you experience severe chest pain and shortness of breath?   4. What number do you call in an emergency?  OT Frequency: Min 2X/week   Barriers to D/C: Inaccessible home environment  ~15 stairs to enter apartment. Pt very high fall risk.       Co-evaluation              AM-PAC OT "6 Clicks" Daily Activity     Outcome Measure Help from another person eating meals?: A Little Help from another person taking care of personal grooming?: A Little Help from another person toileting, which includes using toliet, bedpan, or urinal?: A Little Help from another person bathing (including washing, rinsing, drying)?: A Little Help from another person to put on and taking off regular upper body clothing?: A Little Help from another person to put on and taking off regular lower body clothing?: A Lot 6 Click Score: 17   End of Session Equipment Utilized During Treatment: Gait belt;Rolling walker (2 wheels) Nurse Communication: Mobility status;Other (comment) (Inchair, high fall risk)  Activity Tolerance: Patient tolerated treatment well Patient left: in chair;with call bell/phone within reach;with chair alarm set  OT Visit Diagnosis: Unsteadiness on feet (R26.81);Hemiplegia and hemiparesis;History of falling (Z91.81);Repeated falls (R29.6);Muscle weakness (generalized) (M62.81);Other symptoms and signs involving cognitive function;Apraxia (R48.2);Other symptoms and signs involving the nervous system (R29.898) Hemiplegia - Right/Left: Left Hemiplegia - caused by: Unspecified                Time: 1010-1105 OT Time Calculation (min): 55 min Charges:  OT General Charges $OT Visit:  1 Visit OT Evaluation $OT Eval Moderate Complexity: 1 Mod OT Treatments $Self Care/Home Management : 23-37 mins $Therapeutic Activity: 8-22 mins  Anderson Malta, OT Acute Rehab Services Office:  [phone] 03/13/2021  Julien Girt 03/13/2021, 12:48 PM

## 2021-03-13 NOTE — Evaluation (Signed)
Physical Therapy Evaluation Patient Details Name: Brenda [name] MRN: [id number] DOB: 1960-08-04 Today's Date: 03/13/2021  History of Present Illness  60 years old female adm with AMS/confusion and falls. 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  Large cystic mass in the right occipital parietal lobe with prior  hemorrhage and local mass-effect. Multiple additional metastatic  lesions as above".  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 Pt refused recommended SNF level rehab following last hospital admit.  Clinical Impression  Pt admitted with above diagnosis. Pt ambulated 160' with continuous verbal and manual cues for LLE positioning. SHe has decreased attention to and poor coordination of LLE, repeatedly kicked back post of RW and was unable to make correction of LLE position when advancing LLE. Decreased safety awareness. Assistance with mobility recommended.  Pt currently with functional limitations due to the deficits listed below (see PT Problem List). Pt will benefit from skilled PT to increase their independence and safety with mobility to allow discharge to the venue listed below.          Recommendations for follow up therapy are one component of a multi-disciplinary discharge planning process, led by the attending physician.  Recommendations may be updated based on patient status, additional functional criteria and insurance authorization.  Follow Up Recommendations Skilled nursing-short term rehab (<3 hours/day)    Assistance Recommended at Discharge Frequent or constant Supervision/Assistance  Functional Status Assessment Patient has had a recent decline in their functional status and demonstrates the ability to make significant improvements in function in a reasonable and predictable amount of time.  Equipment Recommendations   Rolling walker (2 wheels)    Recommendations for Other Services       Precautions / Restrictions Precautions Precautions: Fall Restrictions Weight Bearing Restrictions: No      Mobility  Bed Mobility               General bed mobility comments: up in recliner    Transfers Overall transfer level: Needs assistance Equipment used: Rolling walker (2 wheels) Transfers: Sit to/from Stand Sit to Stand: Min assist Stand pivot transfers: Max assist         General transfer comment: Pt with inability to motor plan to turn to chair with RW and required MAx As to safely descent to chair with PT rotating pt's hips enough to line up with chair/bed safely. Increased time and effort to pivot.    Ambulation/Gait Ambulation/Gait assistance: Min assist Gait Distance (Feet): 160 Feet Assistive device: Rolling walker (2 wheels) Gait Pattern/deviations: Decreased step length - left Gait velocity: decr     General Gait Details: poor coordination LLE, repeatedly kicked back L post of RW with no attempt to correct, unable to bring LLE to "step to" position, some dragging of LLE, Max ongoing VCs to bring LLE into frame of RW, 2-3/4 dyspnea with ambulation, 97% SpO2 on RA  Stairs            Wheelchair Mobility    Modified Rankin (Stroke Patients Only)       Balance Overall balance assessment: History of Falls;Needs assistance   Sitting balance-Leahy Scale: Good     Standing balance support: Bilateral upper extremity supported;During functional activity;Reliant on assistive device for balance Standing balance-Leahy Scale: Poor  Pertinent Vitals/Pain Pain Assessment: No/denies pain    Home Living Family/patient expects to be discharged to:: Skilled nursing facility (Pt is refusing rehab stating "he won't let me. It's personal.") Living Arrangements: Spouse/significant other                 Additional Comments: Pt  reports that she lives with fiance and niece stays with them sometimes or pt stays with her.    Prior Function Prior Level of Function : History of Falls (last six months);Patient poor historian/Family not available             Mobility Comments: Pt reports having 2 wheelchairs (sounds like manual and Transport chair) and uses WC in home or furniture cruises around her apartment about 50/50. Frequent falls. Pt will sometimes grab fiance by the neck to help stand up. ADLs Comments: Pt sponge bathes because unable to negotiate tub/shower, Fiance assists with bathing, and dressing.  Pt performs own toilet hygiene, still cooks and cleans.     Hand Dominance   Dominant Hand: Right    Extremity/Trunk Assessment   Upper Extremity Assessment RUE Deficits / Details: Generalized weakness ~3+/5 to 4-/5. RUE Sensation: WNL RUE Coordination: WNL LUE Deficits / Details: + downward drift on 10 sec test, inattention to LT side. PROM: WFL, AROM shoulder flex to ~ 110 then drifting down to ~75 degrees with 10 sec hold. Pt unaware. LUE Sensation: decreased light touch LUE Coordination: decreased fine motor;decreased gross motor    Lower Extremity Assessment Lower Extremity Assessment: LLE deficits/detail LLE Deficits / Details: strength +4/5 throughout; decreased coordination, inattention to LLE, repeatedly kicked back L post of RW when ambulating and did not make attempt at correction LLE Sensation: decreased proprioception LLE Coordination: decreased gross motor;decreased fine motor    Cervical / Trunk Assessment Cervical / Trunk Assessment: Normal  Communication   Communication: No difficulties  Cognition Arousal/Alertness: Awake/alert Behavior During Therapy: Impulsive;Anxious Overall Cognitive Status: No family/caregiver present to determine baseline cognitive functioning                                 General Comments: Pt with decreased safety awareness, decreased  insight, decreased motor planning, decreased attention to taks and topic. Impaired problem solving.        General Comments      Exercises     Assessment/Plan    PT Assessment Patient needs continued PT services  PT Problem List Decreased mobility;Decreased coordination;Decreased balance;Decreased activity tolerance;Decreased cognition       PT Treatment Interventions Therapeutic activities;Therapeutic exercise;Gait training;Functional mobility training;Patient/family education;Balance training    PT Goals (Current goals can be found in the Care Plan section)  Acute Rehab PT Goals Patient Stated Goal: to improve walking PT Goal Formulation: With patient Time For Goal Achievement: 03/27/21 Potential to Achieve Goals: Fair    Frequency Min 2X/week   Barriers to discharge        Co-evaluation               AM-PAC PT "6 Clicks" Mobility  Outcome Measure Help needed turning from your back to your side while in a flat bed without using bedrails?: A Little Help needed moving from lying on your back to sitting on the side of a flat bed without using bedrails?: A Little Help needed moving to and from a bed to a chair (including a wheelchair)?: A Lot Help needed standing up from a chair using  your arms (e.g., wheelchair or bedside chair)?: A Little Help needed to walk in hospital room?: A Little Help needed climbing 3-5 steps with a railing? : A Lot 6 Click Score: 16    End of Session Equipment Utilized During Treatment: Gait belt Activity Tolerance: Patient tolerated treatment well;Patient limited by fatigue Patient left: in bed;with call bell/phone within reach;with family/visitor present;with nursing/sitter in room;with bed alarm set Nurse Communication: Mobility status PT Visit Diagnosis: Difficulty in walking, not elsewhere classified (R26.2);History of falling (Z91.81)    Time: 9794-8016 PT Time Calculation (min) (ACUTE ONLY): 23 min   Charges:   PT  Evaluation $PT Eval Moderate Complexity: 1 Mod PT Treatments $Gait Training: 8-22 mins       Blondell Reveal Kistler PT 03/13/2021  Acute Rehabilitation Services Pager [phone] Office [phone]

## 2021-03-13 NOTE — Progress Notes (Signed)
PROGRESS NOTE    Brenda [name]  GYI:[id number] DOB: 1960/12/02 DOA: 03/11/2021 PCP: Pcp, No   Brief Narrative: Brenda [name] is a 60 y.o. female with PMH significant for stage IV lung CA, used to follow-up with Dr. Julien Nordmann, CHF, tobacco abuse, EtOH abuse, hypoalbuminemia, polysubstance abuse. Patient presented secondary to recurrent falls likely secondary to known metastatic lung cancer with brain metastasis. Decadron started. PT ordered. Palliative care consulted.   Assessment & Plan:   Principal Problem:   Primary malignant neoplasm of lung with metastasis to brain [facility]) Active Problems:   Major depressive disorder, recurrent episode with mood-congruent psychotic features ([place])   Alcohol abuse   Malignant neoplasm of right lung (HCC)   Polysubstance abuse (HCC)   Hypoalbuminemia   Acute on chronic combined systolic and diastolic congestive heart failure (HCC)   Protein-calorie malnutrition, severe   Recurrent falls Associated headache, dizziness, weakness and poor appetite. Possibly related to metastatic cancer diagnosis. Started empirically on Decadron. PT recommending SNF.  Left pleural effusion In setting of known stage IV lung cancer. Currently on room air.  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 No abdominal metastasis noted on non-contrast CT. Oncology consulted. Discussed with oncology who has no recommendations for medical treatment of cancer at this time. -Continue Decadron -Consult neurology and likely radiation oncology  Chronic combined systolic and diastolic heart failure Stable. -Continue Coreg  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 Test positive in January of 2021 with reactive PCR. Negative viral load. Repeat PCR non-reactive.  CKD stage IIIb Baseline creatinine of about 2. Stable.  Alcohol abuse -Continue folic acid, thiamine  Polysubstance abuse History of cocaine use.  Underweight Severe malnutrition Body mass index is  15.59 kg/m.   DVT prophylaxis: Heparin  Code Status:   Code Status: Full Code Family Communication: None at bedside Disposition Plan: Discharge pending goals of care, PT recommendations   Consultants:  Medical oncology Palliative care  Procedures:  None  Antimicrobials: None    Subjective: No concerns this morning. Sitting in chair.  Objective: Vitals:   03/13/21 0421 03/13/21 0422 03/13/21 1432 03/13/21 1515  BP: 120/83 120/83 120/72   Pulse: 91 88 97   Resp: 18 18 15    Temp: 98.2 F (36.8 C) 98.2 F (36.8 C) (!) 97.4 F (36.3 C)   TempSrc: Oral Oral Oral   SpO2: 97% 97% 100% 97%  Weight:      Height:        Intake/Output Summary (Last 24 hours) at 03/13/2021 1746 Last data filed at 03/13/2021 1645 Gross per 24 hour  Intake 611 ml  Output 300 ml  Net 311 ml    Filed Weights   03/11/21 1432 03/12/21 0430  Weight: 56.7 kg 43.8 kg    Examination:  General exam: Appears calm and comfortable Respiratory system: Clear to auscultation. Respiratory effort normal. Cardiovascular system: S1 & S2 heard, RRR. No murmurs, rubs, gallops or clicks. Gastrointestinal system: Abdomen is nondistended, soft and nontender. No organomegaly or masses felt. Normal bowel sounds heard. Central nervous system: Alert and oriented. No focal neurological deficits. Musculoskeletal: No edema. No calf tenderness Skin: No cyanosis. No rashes Psychiatry: Judgement and insight appear normal. Mood & affect appropriate.     Data Reviewed: I have personally reviewed following labs and imaging studies  CBC Lab Results  Component Value Date   WBC 3.5 (L) 03/12/2021   RBC 3.31 (L) 03/12/2021   HGB 11.3 (L) 03/12/2021   HCT 33.4 (L) 03/12/2021   MCV  100.9 (H) 03/12/2021   MCH 34.1 (H) 03/12/2021   PLT 211 03/12/2021   MCHC 33.8 03/12/2021   RDW 14.0 03/12/2021   LYMPHSABS 1.3 03/11/2021   MONOABS 0.5 03/11/2021   EOSABS 0.0 03/11/2021   BASOSABS 0.0 03/11/2021     Last  metabolic panel Lab Results  Component Value Date   NA 131 (L) 03/12/2021   K 3.8 03/12/2021   CL 104 03/12/2021   CO2 18 (L) 03/12/2021   BUN 17 03/12/2021   CREATININE 1.95 (H) 03/12/2021   GLUCOSE 113 (H) 03/12/2021   GFRNONAA 29 (L) 03/12/2021   GFRAA  07/01/2020    QUESTIONABLE RESULTS, RECOMMEND RECOLLECT TO VERIFY   CALCIUM 8.5 (L) 03/12/2021   PHOS 3.9 03/12/2021   PROT 6.6 03/12/2021   ALBUMIN 2.4 (L) 03/12/2021   BILITOT 0.5 03/12/2021   ALKPHOS 148 (H) 03/12/2021   AST 17 03/12/2021   ALT 12 03/12/2021   ANIONGAP 9 03/12/2021    CBG (last 3)  No results for input(s): GLUCAP in the last 72 hours.   GFR: Estimated Creatinine Clearance: 21.7 mL/min (A) (by C-G formula based on SCr of 1.95 mg/dL (H)).  Coagulation Profile: No results for input(s): INR, PROTIME in the last 168 hours.  Recent Results (from the past 240 hour(s))  Resp Panel by RT-PCR (Flu A&B, Covid) Nasopharyngeal Swab     Status: None   Collection Time: 03/11/21  3:17 PM   Specimen: Nasopharyngeal Swab; Nasopharyngeal(NP) swabs in vial transport medium  Result Value Ref Range Status   SARS Coronavirus 2 by RT PCR NEGATIVE NEGATIVE Final    Comment: (NOTE) SARS-CoV-2 target nucleic acids are NOT DETECTED.  The SARS-CoV-2 RNA is generally detectable in upper respiratory specimens during the acute phase of infection. The lowest concentration of SARS-CoV-2 viral copies this assay can detect is 138 copies/mL. A negative result does not preclude SARS-Cov-2 infection and should not be used as the sole basis for treatment or other patient management decisions. A negative result may occur with  improper specimen collection/handling, submission of specimen other than nasopharyngeal swab, presence of viral mutation(s) within the areas targeted by this assay, and inadequate number of viral copies(<138 copies/mL). A negative result must be combined with clinical observations, patient history, and  epidemiological information. The expected result is Negative.  Fact Sheet for Patients:  EntrepreneurPulse.com.au  Fact Sheet for Healthcare Providers:  IncredibleEmployment.be  This test is no t yet approved or cleared by the [place] FDA and  has been authorized for detection and/or diagnosis of SARS-CoV-2 by FDA under an Emergency Use Authorization (EUA). This EUA will remain  in effect (meaning this test can be used) for the duration of the COVID-19 declaration under Section 564(b)(1) of the Act, 21 U.S.C.section 360bbb-3(b)(1), unless the authorization is terminated  or revoked sooner.       Influenza A by PCR NEGATIVE NEGATIVE Final   Influenza B by PCR NEGATIVE NEGATIVE Final    Comment: (NOTE) The Xpert Xpress SARS-CoV-2/FLU/RSV plus assay is intended as an aid in the diagnosis of influenza from Nasopharyngeal swab specimens and should not be used as a sole basis for treatment. Nasal washings and aspirates are unacceptable for Xpert Xpress SARS-CoV-2/FLU/RSV testing.  Fact Sheet for Patients: EntrepreneurPulse.com.au  Fact Sheet for Healthcare Providers: IncredibleEmployment.be  This test is not yet approved or cleared by the [place] FDA and has been authorized for detection and/or diagnosis of SARS-CoV-2 by FDA under an Emergency Use Authorization (EUA). This EUA  will remain in effect (meaning this test can be used) for the duration of the COVID-19 declaration under Section 564(b)(1) of the Act, 21 U.S.C. section 360bbb-3(b)(1), unless the authorization is terminated or revoked.  Performed at [facility], [place] [address], [place], [place] [address]         Radiology Studies: MR BRAIN W WO CONTRAST  Result Date: 03/11/2021 CLINICAL DATA:  Lung cancer. Abnormal CT head today. Rule out metastatic disease. EXAM: MRI HEAD WITHOUT AND WITH CONTRAST TECHNIQUE:  Multiplanar, multiecho pulse sequences of the brain and surrounding structures were obtained without and with intravenous contrast. CONTRAST:  75mL GADAVIST GADOBUTROL 1 MMOL/ML IV SOLN COMPARISON:  CT head 03/11/2021 FINDINGS: Brain: Motion degraded study Multiple enhancing lesions compatible with metastatic disease. 6.6 x 3.7 cm cystic mass in the right occipital parietal lobe. Much of this is cystic however there is enhancing tumor around the periphery of the mass as well as hemorrhage within the lesion. Mild surrounding edema and local mass-effect. 20 mm cystic and enhancing lesion in the right frontal lobe with mild edema and mild hemorrhage. This also is consistent with metastatic disease. Small focus of restricted diffusion right anterior temporal lobe without definite enhancement. This could be a recent infarct versus metastatic disease. Enhancement could be obscured by motion. 6 mm enhancing lesion left frontal lobe consistent metastatic disease. See sagittal image 18 6 mm lesions in the left lateral frontal lobe and left operculum consistent with metastatic disease. Sagittal image 21. Possible 3 mm enhancing lesion left temporal lobe. 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 Negative for hydrocephalus. Mild midline shift to the left. Vascular: Normal arterial flow voids Skull and upper cervical spine: No skeletal metastasis. Contusion left frontal scalp from trauma. Sinuses/Orbits: Mucosal edema in the paranasal sinuses most prominent left maxillary sinus. Negative orbit Other: None IMPRESSION: Findings consistent with widespread metastatic disease to brain. Large cystic mass in the right occipital parietal lobe with prior hemorrhage and local mass-effect. Multiple additional metastatic lesions as above. Left frontal scalp contusion compatible with recent trauma. Electronically Signed   By: Franchot Gallo M.D.   On: 03/11/2021 19:37   CT CHEST ABDOMEN PELVIS WO CONTRAST  Result  Date: 03/11/2021 CLINICAL DATA:  Metastatic disease evaluation. EXAM: CT CHEST, ABDOMEN AND PELVIS WITHOUT CONTRAST TECHNIQUE: Multidetector CT imaging of the chest, abdomen and pelvis was performed following the standard protocol without IV contrast. COMPARISON:  CT dated 05/21/2020. FINDINGS: Evaluation of this exam is limited in the absence of intravenous contrast. CT CHEST FINDINGS Cardiovascular: No cardiomegaly or pericardial effusion. There is coronary vascular calcification. Right-sided Port-A-Cath with tip at the cavoatrial junction. The thoracic aorta and central pulmonary arteries are grossly unremarkable on this noncontrast CT. Mediastinum/Nodes: No hilar or mediastinal adenopathy. The esophagus is grossly unremarkable. No mediastinal fluid collection. Lungs/Pleura: Moderate size left pleural effusion slightly increased since the prior CT. There is partial compressive atelectasis of the left lung versus pneumonia. Background of emphysema. Interval increase in the size of the dominant right upper lobe mass measuring 3 x 3 cm (previously 2 x 2 cm). A 1.2 x 0.8 cm nodular density with spiculated margins adjacent to the dominant mass is similar to prior CT. Additionally there is a 1.9 x 2.7 cm pleural based nodule with irregular/spiculated margin from the posteromedial right upper lobe similar or minimally increased since the prior CT. There is trace right pleural effusion. No pneumothorax. The central airways are patent. Musculoskeletal: Old healed bilateral posterior  rib fractures. No acute osseous pathology. CT ABDOMEN PELVIS FINDINGS No intra-abdominal free air or free fluid. Hepatobiliary: The liver is unremarkable. No intrahepatic biliary dilatation. Multiple gallstones. No pericholecystic fluid or evidence of acute cholecystitis by CT. Pancreas: Unremarkable. No pancreatic ductal dilatation or surrounding inflammatory changes. Spleen: Normal in size without focal abnormality. Adrenals/Urinary Tract:  The adrenal glands unremarkable. The kidneys, visualized ureters, and urinary bladder appear unremarkable. Stomach/Bowel: There is no bowel obstruction or active inflammation. The appendix is normal. Vascular/Lymphatic: Moderate aortoiliac atherosclerotic disease. The IVC is unremarkable. No portal venous gas. There is no adenopathy. Reproductive: The uterus and ovaries are grossly unremarkable. No pelvic masses Other: Several small fat containing supraumbilical hernia. Musculoskeletal: Osteopenia.  No acute osseous pathology. IMPRESSION: 1. Interval increase in the size of the dominant right upper lobe mass. 2. Slight interval increase in the size of the pleural based nodule in the posteromedial right upper lobe. 3. Moderate size left pleural effusion slightly increased since the prior CT. There is partial compressive atelectasis of the left lung versus pneumonia. 4. Cholelithiasis. 5. No evidence of metastatic disease in the abdomen or pelvis. 6. Aortic Atherosclerosis (ICD10-I70.0) and Emphysema (ICD10-J43.9). Electronically Signed   By: Anner Crete M.D.   On: 03/11/2021 22:25        Scheduled Meds:  carvedilol  3.125 mg Oral BID WC   dexamethasone  4 mg Oral Q8H   feeding supplement  237 mL Oral TID BM   ferrous sulfate  325 mg Oral BID WC   gabapentin  300 mg Oral QHS   heparin  5,000 Units Subcutaneous Q8H   multivitamin with minerals  1 tablet Oral Daily   nicotine  14 mg Transdermal Daily   pantoprazole  40 mg Oral BID   polyethylene glycol  17 g Oral BID   thiamine  100 mg Oral Daily   Continuous Infusions:     LOS: 2 days     Cordelia Poche, MD [facility] 03/13/2021, 5:46 PM  If 7PM-7AM, please contact night-coverage [email or web address]

## 2021-03-14 ENCOUNTER — Ambulatory Visit
Admit: 2021-03-14 | Discharge: 2021-03-14 | Disposition: A | Discharge status: Home / Self Care | Payer: Medicaid Other | Attending: Radiation Oncology | Admitting: Radiation Oncology

## 2021-03-14 DIAGNOSIS — I5043 Acute on chronic combined systolic (congestive) and diastolic (congestive) heart failure: Secondary | ICD-10-CM | POA: Diagnosis not present

## 2021-03-14 DIAGNOSIS — C3490 Malignant neoplasm of unspecified part of unspecified bronchus or lung: Secondary | ICD-10-CM | POA: Diagnosis not present

## 2021-03-14 DIAGNOSIS — C7931 Secondary malignant neoplasm of brain: Secondary | ICD-10-CM | POA: Diagnosis not present

## 2021-03-14 DIAGNOSIS — F1010 Alcohol abuse, uncomplicated: Secondary | ICD-10-CM | POA: Diagnosis not present

## 2021-03-14 NOTE — Consult Note (Signed)
Radiation Oncology         [phone] ________________________________  Name: Brenda [name] MRN: [id number]  Date: 03/11/2021  DOB: [birth year]   REFERRING PHYSICIAN: Cordelia Poche, MD   DIAGNOSIS: The primary encounter diagnosis was Brain metastases ([place]). Diagnoses of Fall, initial encounter, Stage 4 malignant neoplasm of lung, unspecified laterality ([place]), and Pleural effusion were also pertinent to this visit.   HISTORY OF PRESENT ILLNESS::Brenda [name] is a 60 y.o. female who is seen for an initial consultation visit regarding the patient's diagnosis of metastatic non-small cell lung cancer.  The patient has a known history of this diagnosis and previously received chemotherapy.  She denies any prior radiation treatment.  The patient has been admitted to the hospital and recent imaging has demonstrated brain metastasis.  She has multiple lesions including a large cystic lesion.  The patient has been seen by neurosurgery and does not wish to undergo surgical resection of the dominant tumor.  I have been asked to see the patient therefore for consideration of palliative radiation treatment to the brain.  PREVIOUS RADIATION THERAPY: No   PAST MEDICAL HISTORY:  has a past medical history of Breast cancer ([place]), Depression, and Mental disorder.     PAST SURGICAL HISTORY: Past Surgical History:  Procedure Laterality Date   ESOPHAGOGASTRODUODENOSCOPY (EGD) WITH PROPOFOL N/A 04/09/2020   Procedure: ESOPHAGOGASTRODUODENOSCOPY (EGD) WITH PROPOFOL;  Surgeon: Doran Stabler, MD;  Location: [place];  Service: Gastroenterology;  Laterality: N/A;   IR IMAGING GUIDED PORT INSERTION  11/03/2019   TUBAL LIGATION     VIDEO BRONCHOSCOPY WITH ENDOBRONCHIAL NAVIGATION N/A 04/04/2019   Procedure: VIDEO BRONCHOSCOPY WITH ENDOBRONCHIAL NAVIGATION;  Surgeon: Garner Nash, DO;  Location: [place];  Service: Thoracic;  Laterality: N/A;   VIDEO BRONCHOSCOPY WITH ENDOBRONCHIAL ULTRASOUND N/A  04/04/2019   Procedure: VIDEO BRONCHOSCOPY WITH ENDOBRONCHIAL ULTRASOUND;  Surgeon: Garner Nash, DO;  Location: [place];  Service: Thoracic;  Laterality: N/A;     FAMILY HISTORY: family history includes Cancer in her mother; Heart attack in her father.   SOCIAL HISTORY:  reports that she quit smoking about 21 months ago. Her smoking use included cigarettes. She has a 15.00 pack-year smoking history. She has never used smokeless tobacco. She reports current alcohol use of about 84.0 standard drinks per week. She reports current drug use. Drug: Cocaine.   ALLERGIES: Aspirin adult low [aspirin]   MEDICATIONS:  Current Facility-Administered Medications  Medication Dose Route Frequency Provider Last Rate Last Admin   acetaminophen (TYLENOL) tablet 650 mg  650 mg Oral Q6H PRN Shawna Clamp, MD   650 mg at 03/12/21 2035   Or   acetaminophen (TYLENOL) suppository 650 mg  650 mg Rectal Q6H PRN Shawna Clamp, MD       carvedilol (COREG) tablet 3.125 mg  3.125 mg Oral BID WC Shawna Clamp, MD   3.125 mg at 03/14/21 1649   dexamethasone (DECADRON) tablet 4 mg  4 mg Oral Q8H Shawna Clamp, MD   4 mg at 03/14/21 1500   feeding supplement (ENSURE ENLIVE / ENSURE PLUS) liquid 237 mL  237 mL Oral TID BM Mariel Aloe, MD   237 mL at 03/14/21 1448   ferrous sulfate tablet 325 mg  325 mg Oral BID WC Shawna Clamp, MD   325 mg at 03/14/21 1649   gabapentin (NEURONTIN) capsule 300 mg  300 mg Oral QHS Shawna Clamp, MD   300 mg at 03/13/21 2126   heparin injection 5,000 Units  5,000  Units Subcutaneous Q8H Shawna Clamp, MD   5,000 Units at 03/14/21 1443   multivitamin with minerals tablet 1 tablet  1 tablet Oral Daily Mariel Aloe, MD   1 tablet at 03/14/21 1021   nicotine (NICODERM CQ - dosed in mg/24 hours) patch 14 mg  14 mg Transdermal Daily Shawna Clamp, MD   14 mg at 03/14/21 1002   ondansetron (ZOFRAN) tablet 4 mg  4 mg Oral Q6H PRN Shawna Clamp, MD       Or   ondansetron [facility])  injection 4 mg  4 mg Intravenous Q6H PRN Shawna Clamp, MD       oxyCODONE (Oxy IR/ROXICODONE) immediate release tablet 5 mg  5 mg Oral Q6H PRN Shawna Clamp, MD   5 mg at 03/13/21 2126   pantoprazole (PROTONIX) EC tablet 40 mg  40 mg Oral BID Shawna Clamp, MD   40 mg at 03/14/21 1021   polyethylene glycol (MIRALAX / GLYCOLAX) packet 17 g  17 g Oral BID Shawna Clamp, MD   17 g at 03/14/21 1022   thiamine tablet 100 mg  100 mg Oral Daily Shawna Clamp, MD   100 mg at 03/14/21 1021     REVIEW OF SYSTEMS:  A 15 point review of systems is documented in the electronic medical record. This was obtained by the nursing staff. However, I reviewed this with the patient to discuss relevant findings and make appropriate changes.  Pertinent items are noted in HPI.    PHYSICAL EXAM:  height is 5\' 6"  (1.676 m) and weight is 96 lb 9 oz (43.8 kg). Her oral temperature is 97.6 F (36.4 C). Her blood pressure is 125/76 and her pulse is 92. Her respiration is 18 and oxygen saturation is 98%.   ECOG = 2  0 - Asymptomatic (Fully active, able to carry on all predisease activities without restriction)  1 - Symptomatic but completely ambulatory (Restricted in physically strenuous activity but ambulatory and able to carry out work of a light or sedentary nature. For example, light housework, office work)  2 - Symptomatic, <50% in bed during the day (Ambulatory and capable of all self care but unable to carry out any work activities. Up and about more than 50% of waking hours)  3 - Symptomatic, >50% in bed, but not bedbound (Capable of only limited self-care, confined to bed or chair 50% or more of waking hours)  4 - Bedbound (Completely disabled. Cannot carry on any self-care. Totally confined to bed or chair)  5 - Death   Eustace Pen MM, Creech RH, Tormey DC, et al. [id number]). "Toxicity and response criteria of the [facility]". [place] Oncol. 5 (6): 649-55  Alert, no  distress   LABORATORY DATA:  Lab Results  Component Value Date   WBC 3.5 (L) 03/12/2021   HGB 11.3 (L) 03/12/2021   HCT 33.4 (L) 03/12/2021   MCV 100.9 (H) 03/12/2021   PLT 211 03/12/2021   Lab Results  Component Value Date   NA 131 (L) 03/12/2021   K 3.8 03/12/2021   CL 104 03/12/2021   CO2 18 (L) 03/12/2021   Lab Results  Component Value Date   ALT 12 03/12/2021   AST 17 03/12/2021   ALKPHOS 148 (H) 03/12/2021   BILITOT 0.5 03/12/2021      RADIOGRAPHY: DG Chest 2 View  Result Date: 03/11/2021 CLINICAL DATA:  Fall. Additional history provided: Patient reports not eating for 4 days, 2 falls over the past  2 days, left-sided rib pain and headache. EXAM: CHEST - 2 VIEW COMPARISON:  Prior chest radiographs 08/15/2020 and earlier. Chest CT 05/21/2020 FINDINGS: A right chest infusion port catheter is present with tip projecting at the level of the lower SVC. The cardiac silhouette is partially obscured, limiting evaluation of heart size. Moderate-sized left pleural effusion with associated left basilar atelectasis and/or consolidation. A partially calcified right upper lobe mass appears increased in size as compared to the prior chest radiograph of 08/15/2020, now measuring at least 3.4 cm. A smaller nodule within the medial right upper lobe also appears more conspicuous. No evidence of pneumothorax. No acute bony abnormality is identified. However, correlate with findings on concurrently performed radiographs of the left ribs. IMPRESSION: A right upper lobe partially calcified mass appears increased in size as compared to the prior chest radiograph 08/15/2020, now measuring at least 3.4 cm. A smaller nodule within the medial right upper lobe also appears more conspicuous. A contrast-enhanced chest CT may be obtained for further evaluation, as clinically warranted. Moderate-sized left pleural effusion with associated left basilar atelectasis and/or consolidation. Electronically Signed   By:  Kellie Simmering D.O.   On: 03/11/2021 15:44   DG Ribs Unilateral W/Chest Left  Result Date: 03/11/2021 CLINICAL DATA:  Provided history: Fall. EXAM: LEFT RIBS AND CHEST - 3+ VIEW COMPARISON:  Same day chest radiographs 03/11/2021. Prior chest radiographs 08/15/2020 and earlier. FINDINGS: No acute displaced left rib fracture is identified. There are multiple known chronic, healed left-sided rib fracture deformities. Please refer to the concurrently performed chest radiographs for a description of intrathoracic findings. IMPRESSION: No acute, displaced left rib fracture is identified. A CT of the thorax may be obtained for further evaluation, if clinically warranted. Electronically Signed   By: Kellie Simmering D.O.   On: 03/11/2021 15:59   CT Head Wo Contrast  Result Date: 03/11/2021 CLINICAL DATA:  Trauma, history of malignancy per the CT technologist. EXAM: CT HEAD WITHOUT CONTRAST TECHNIQUE: Contiguous axial images were obtained from the base of the skull through the vertex without intravenous contrast. COMPARISON:  Brain MRI 08/10/2020 FINDINGS: Brain: There is a new large solid and cystic lesion centered in the right centrum semiovale measuring 6.4 cm AP x 3.9 cm TV by 6.0 cm cc. There is a smaller new solid and cystic lesion in the right frontal lobe measuring 1.9 cm AP by 1.8 cm TV by 1.8 cm cc. The larger lesion exerts regional mass effect with mild perilesional edema resulting in partial effacement of the right lateral ventricle and effacement of the surrounding sulci. There is no measurable midline shift. No other definite lesions are seen. There is no evidence of acute intracranial hemorrhage or extra-axial fluid collection. 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 Vascular: No hyperdense vessel or unexpected calcification. Skull: Normal. Negative for fracture or focal lesion. Sinuses/Orbits: There is mucosal thickening in the left maxillary sinus, incompletely imaged. The globes and orbits are  unremarkable. Other: None. IMPRESSION: Two solid and cystic lesions in the right cerebral hemisphere, the largest measuring up to 6.4 cm, are new since 08/10/2020, concerning for metastatic disease. There is regional mass effect caused by the larger lesion with partial effacement of the right lateral ventricle but no midline shift. Brain MRI with and without contrast is recommended for further characterization. These results were called by telephone at the time of interpretation on 03/11/2021 at 3:59 pm to provider Dr Karle Starch, who verbally acknowledged these results. Electronically Signed   By: Collier Salina  Noone M.D.   On: 03/11/2021 16:00   MR BRAIN W WO CONTRAST  Result Date: 03/11/2021 CLINICAL DATA:  Lung cancer. Abnormal CT head today. Rule out metastatic disease. EXAM: MRI HEAD WITHOUT AND WITH CONTRAST TECHNIQUE: Multiplanar, multiecho pulse sequences of the brain and surrounding structures were obtained without and with intravenous contrast. CONTRAST:  64mL GADAVIST GADOBUTROL 1 MMOL/ML IV SOLN COMPARISON:  CT head 03/11/2021 FINDINGS: Brain: Motion degraded study Multiple enhancing lesions compatible with metastatic disease. 6.6 x 3.7 cm cystic mass in the right occipital parietal lobe. Much of this is cystic however there is enhancing tumor around the periphery of the mass as well as hemorrhage within the lesion. Mild surrounding edema and local mass-effect. 20 mm cystic and enhancing lesion in the right frontal lobe with mild edema and mild hemorrhage. This also is consistent with metastatic disease. Small focus of restricted diffusion right anterior temporal lobe without definite enhancement. This could be a recent infarct versus metastatic disease. Enhancement could be obscured by motion. 6 mm enhancing lesion left frontal lobe consistent metastatic disease. See sagittal image 18 6 mm lesions in the left lateral frontal lobe and left operculum consistent with metastatic disease. Sagittal image 21.  Possible 3 mm enhancing lesion left temporal lobe. 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 Negative for hydrocephalus. Mild midline shift to the left. Vascular: Normal arterial flow voids Skull and upper cervical spine: No skeletal metastasis. Contusion left frontal scalp from trauma. Sinuses/Orbits: Mucosal edema in the paranasal sinuses most prominent left maxillary sinus. Negative orbit Other: None IMPRESSION: Findings consistent with widespread metastatic disease to brain. Large cystic mass in the right occipital parietal lobe with prior hemorrhage and local mass-effect. Multiple additional metastatic lesions as above. Left frontal scalp contusion compatible with recent trauma. Electronically Signed   By: Franchot Gallo M.D.   On: 03/11/2021 19:37   CT CHEST ABDOMEN PELVIS WO CONTRAST  Result Date: 03/11/2021 CLINICAL DATA:  Metastatic disease evaluation. EXAM: CT CHEST, ABDOMEN AND PELVIS WITHOUT CONTRAST TECHNIQUE: Multidetector CT imaging of the chest, abdomen and pelvis was performed following the standard protocol without IV contrast. COMPARISON:  CT dated 05/21/2020. FINDINGS: Evaluation of this exam is limited in the absence of intravenous contrast. CT CHEST FINDINGS Cardiovascular: No cardiomegaly or pericardial effusion. There is coronary vascular calcification. Right-sided Port-A-Cath with tip at the cavoatrial junction. The thoracic aorta and central pulmonary arteries are grossly unremarkable on this noncontrast CT. Mediastinum/Nodes: No hilar or mediastinal adenopathy. The esophagus is grossly unremarkable. No mediastinal fluid collection. Lungs/Pleura: Moderate size left pleural effusion slightly increased since the prior CT. There is partial compressive atelectasis of the left lung versus pneumonia. Background of emphysema. Interval increase in the size of the dominant right upper lobe mass measuring 3 x 3 cm (previously 2 x 2 cm). A 1.2 x 0.8 cm nodular density with  spiculated margins adjacent to the dominant mass is similar to prior CT. Additionally there is a 1.9 x 2.7 cm pleural based nodule with irregular/spiculated margin from the posteromedial right upper lobe similar or minimally increased since the prior CT. There is trace right pleural effusion. No pneumothorax. The central airways are patent. Musculoskeletal: Old healed bilateral posterior rib fractures. No acute osseous pathology. CT ABDOMEN PELVIS FINDINGS No intra-abdominal free air or free fluid. Hepatobiliary: The liver is unremarkable. No intrahepatic biliary dilatation. Multiple gallstones. No pericholecystic fluid or evidence of acute cholecystitis by CT. Pancreas: Unremarkable. No pancreatic ductal dilatation or surrounding inflammatory changes. Spleen:  Normal in size without focal abnormality. Adrenals/Urinary Tract: The adrenal glands unremarkable. The kidneys, visualized ureters, and urinary bladder appear unremarkable. Stomach/Bowel: There is no bowel obstruction or active inflammation. The appendix is normal. Vascular/Lymphatic: Moderate aortoiliac atherosclerotic disease. The IVC is unremarkable. No portal venous gas. There is no adenopathy. Reproductive: The uterus and ovaries are grossly unremarkable. No pelvic masses Other: Several small fat containing supraumbilical hernia. Musculoskeletal: Osteopenia.  No acute osseous pathology. IMPRESSION: 1. Interval increase in the size of the dominant right upper lobe mass. 2. Slight interval increase in the size of the pleural based nodule in the posteromedial right upper lobe. 3. Moderate size left pleural effusion slightly increased since the prior CT. There is partial compressive atelectasis of the left lung versus pneumonia. 4. Cholelithiasis. 5. No evidence of metastatic disease in the abdomen or pelvis. 6. Aortic Atherosclerosis (ICD10-I70.0) and Emphysema (ICD10-J43.9). Electronically Signed   By: Anner Crete M.D.   On: 03/11/2021 22:25        IMPRESSION/ PLAN:  The patient has a diagnosis of stage IV non-small cell lung cancer, now with brain metastasis.  No prior history of radiation treatment.  I had a chance to review the patient's imaging and she does have multiple brain metastases including a large cystic lesion.  Given the size of this and her overall picture, as well as imaging characteristics within the brain, I believe that a course of whole brain radiation treatment would be appropriate.  I discussed this potential treatment with the patient.  We discussed the rationale of treatment as well as possible side effects and risks.  She does wish to proceed with this treatment.  Therefore she will undergo simulation today for treatment planning.  We plan to begin her treatment as soon as possible next week on Tuesday.  I plan to treat the patient with a course of whole brain radiation treatment to a dose of 30 Gray in 10 fractions.   The patient was seen in person today in clinic with the patient and inpatient.  The total time spent on the patient's visit today was 40 minutes, including chart review, direct discussion/evaluation with the patient, and coordination of care.     ________________________________   Jodelle Gross, MD, PhD   **Disclaimer: This note was dictated with voice recognition software. Similar sounding words can inadvertently be transcribed and this note may contain transcription errors which may not have been corrected upon publication of note.**

## 2021-03-14 NOTE — Progress Notes (Signed)
PROGRESS NOTE    Brenda [name]  RDE:[id number] DOB: 1960-08-30 DOA: 03/11/2021 PCP: Pcp, No   Brief Narrative: Brenda [name] is a 60 y.o. female with PMH significant for stage IV lung CA, used to follow-up with Dr. Julien Nordmann, CHF, tobacco abuse, EtOH abuse, hypoalbuminemia, polysubstance abuse. Patient presented secondary to recurrent falls likely secondary to known metastatic lung cancer with brain metastasis. Decadron started. PT ordered. Palliative care consulted.   Assessment & Plan:   Principal Problem:   Primary malignant neoplasm of lung with metastasis to brain [facility]) Active Problems:   Major depressive disorder, recurrent episode with mood-congruent psychotic features ([place])   Alcohol abuse   Malignant neoplasm of right lung (HCC)   Polysubstance abuse (HCC)   Hypoalbuminemia   Acute on chronic combined systolic and diastolic congestive heart failure (HCC)   Protein-calorie malnutrition, severe   Recurrent falls Associated headache, dizziness, weakness and poor appetite. Possibly related to metastatic cancer diagnosis. Started empirically on Decadron. PT recommending SNF however patient declines and requests discharge home when medically cleared.  Left pleural effusion In setting of known stage IV lung cancer. Currently on room air.  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 No abdominal metastasis noted on non-contrast CT. Oncology consulted. Discussed with oncology who has no recommendations for medical treatment of cancer at this time. Neurosurgery evaluated imaging and will offer surgical management if patient desires; after discussion with patient, she is not interested in surgical management. Patient is interested in radiation therapy for palliation. -Continue Decadron -Consult radiation oncology  Chronic combined systolic and diastolic heart failure Stable. 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 Negative viral load. Repeat PCR non-reactive. Possibly error or false positive.  CKD stage IIIb Baseline creatinine of about 2. Stable.  Alcohol abuse -Continue folic acid, thiamine  Polysubstance abuse History of cocaine use.  Underweight Severe malnutrition Body mass index is 15.59 kg/m.   DVT prophylaxis: Heparin  Code Status:   Code Status: Full Code Family Communication: None at bedside. Patient declined for me to call family. Disposition Plan: Discharge home (patient declines SNF)   Consultants:  Medical oncology Palliative care  Procedures:  None  Antimicrobials: None    Subjective: Some left sided chest pain  Objective: Vitals:   03/13/21 2029 03/13/21 2029 03/14/21 0439 03/14/21 1327  BP: 124/83 124/83 (!) 141/102 125/76  Pulse: (!) 103 100 91 92  Resp: 18 18 18 18   Temp: 98.4 F (36.9 C) 98.4 F (36.9 C) 98.1 F (36.7 C) 97.6 F (36.4 C)  TempSrc: Oral Oral Oral Oral  SpO2: 97% 98% 98% 98%  Weight:      Height:        Intake/Output Summary (Last 24 hours) at 03/14/2021 1348 Last data filed at 03/14/2021 1235 Gross per 24 hour  Intake 600 ml  Output 1200 ml  Net -600 ml    Filed Weights   03/11/21 1432 03/12/21 0430  Weight: 56.7 kg 43.8 kg    Examination:  General exam: Appears calm and comfortable Respiratory system: Clear to auscultation. Respiratory effort normal. Cardiovascular system: S1 & S2 heard, RRR. No murmurs, rubs, gallops or clicks. Gastrointestinal system: Abdomen is nondistended, soft and nontender. No organomegaly or masses felt. Normal bowel sounds heard. Central nervous system: Alert and oriented. Left 4/5 extremity strength Musculoskeletal: No edema. No calf tenderness Skin: No cyanosis. No rashes Psychiatry: Judgement and insight appear normal. Mood & affect appropriate.  Data Reviewed: I have personally reviewed following labs and imaging studies  CBC Lab Results  Component Value  Date   WBC 3.5 (L) 03/12/2021   RBC 3.31 (L) 03/12/2021   HGB 11.3 (L) 03/12/2021   HCT 33.4 (L) 03/12/2021   MCV 100.9 (H) 03/12/2021   MCH 34.1 (H) 03/12/2021   PLT 211 03/12/2021   MCHC 33.8 03/12/2021   RDW 14.0 03/12/2021   LYMPHSABS 1.3 03/11/2021   MONOABS 0.5 03/11/2021   EOSABS 0.0 03/11/2021   BASOSABS 0.0 51/76/1607     Last metabolic panel Lab Results  Component Value Date   NA 131 (L) 03/12/2021   K 3.8 03/12/2021   CL 104 03/12/2021   CO2 18 (L) 03/12/2021   BUN 17 03/12/2021   CREATININE 1.95 (H) 03/12/2021   GLUCOSE 113 (H) 03/12/2021   GFRNONAA 29 (L) 03/12/2021   GFRAA  07/01/2020    QUESTIONABLE RESULTS, RECOMMEND RECOLLECT TO VERIFY   CALCIUM 8.5 (L) 03/12/2021   PHOS 3.9 03/12/2021   PROT 6.6 03/12/2021   ALBUMIN 2.4 (L) 03/12/2021   BILITOT 0.5 03/12/2021   ALKPHOS 148 (H) 03/12/2021   AST 17 03/12/2021   ALT 12 03/12/2021   ANIONGAP 9 03/12/2021    CBG (last 3)  No results for input(s): GLUCAP in the last 72 hours.   GFR: Estimated Creatinine Clearance: 21.7 mL/min (A) (by C-G formula based on SCr of 1.95 mg/dL (H)).  Coagulation Profile: No results for input(s): INR, PROTIME in the last 168 hours.  Recent Results (from the past 240 hour(s))  Resp Panel by RT-PCR (Flu A&B, Covid) Nasopharyngeal Swab     Status: None   Collection Time: 03/11/21  3:17 PM   Specimen: Nasopharyngeal Swab; Nasopharyngeal(NP) swabs in vial transport medium  Result Value Ref Range Status   SARS Coronavirus 2 by RT PCR NEGATIVE NEGATIVE Final    Comment: (NOTE) SARS-CoV-2 target nucleic acids are NOT DETECTED.  The SARS-CoV-2 RNA is generally detectable in upper respiratory specimens during the acute phase of infection. The lowest concentration of SARS-CoV-2 viral copies this assay can detect is 138 copies/mL. A negative result does not preclude SARS-Cov-2 infection and should not be used as the sole basis for treatment or other patient management  decisions. A negative result may occur with  improper specimen collection/handling, submission of specimen other than nasopharyngeal swab, presence of viral mutation(s) within the areas targeted by this assay, and inadequate number of viral copies(<138 copies/mL). A negative result must be combined with clinical observations, patient history, and epidemiological information. The expected result is Negative.  Fact Sheet for Patients:  EntrepreneurPulse.com.au  Fact Sheet for Healthcare Providers:  IncredibleEmployment.be  This test is no t yet approved or cleared by the [place] FDA and  has been authorized for detection and/or diagnosis of SARS-CoV-2 by FDA under an Emergency Use Authorization (EUA). This EUA will remain  in effect (meaning this test can be used) for the duration of the COVID-19 declaration under Section 564(b)(1) of the Act, 21 U.S.C.section 360bbb-3(b)(1), unless the authorization is terminated  or revoked sooner.       Influenza A by PCR NEGATIVE NEGATIVE Final   Influenza B by PCR NEGATIVE NEGATIVE Final    Comment: (NOTE) The Xpert Xpress SARS-CoV-2/FLU/RSV plus assay is intended as an aid in the diagnosis of influenza from Nasopharyngeal swab specimens and should not be used as a sole basis for treatment. Nasal washings and aspirates are unacceptable for Xpert Xpress SARS-CoV-2/FLU/RSV testing.  Fact Sheet for Patients: EntrepreneurPulse.com.au  Fact Sheet for Healthcare Providers: IncredibleEmployment.be  This test is not yet approved or cleared by the [place] FDA and has been authorized for detection and/or diagnosis of SARS-CoV-2 by FDA under an Emergency Use Authorization (EUA). This EUA will remain in effect (meaning this test can be used) for the duration of the COVID-19 declaration under Section 564(b)(1) of the Act, 21 U.S.C. section 360bbb-3(b)(1), unless the  authorization is terminated or revoked.  Performed at [facility], [place] [address], [place], [place] [address]         Radiology Studies: No results found.      Scheduled Meds:  carvedilol  3.125 mg Oral BID WC   dexamethasone  4 mg Oral Q8H   feeding supplement  237 mL Oral TID BM   ferrous sulfate  325 mg Oral BID WC   gabapentin  300 mg Oral QHS   heparin  5,000 Units Subcutaneous Q8H   multivitamin with minerals  1 tablet Oral Daily   nicotine  14 mg Transdermal Daily   pantoprazole  40 mg Oral BID   polyethylene glycol  17 g Oral BID   thiamine  100 mg Oral Daily   Continuous Infusions:     LOS: 3 days     Cordelia Poche, MD [facility] 03/14/2021, 1:48 PM  If 7PM-7AM, please contact night-coverage [email or web address]

## 2021-03-14 NOTE — Progress Notes (Signed)
Patient currently off the floor Chart reviewed Patient noted to be in rad onc for evaluation.  PMT to follow over the weekend for further goals of care discussions.  No charge Loistine Chance MD [place] palliative.

## 2021-03-14 NOTE — TOC Initial Note (Signed)
Transition of Care [facility]) - Initial/Assessment Note    Patient Details  Name: Brenda [name] MRN: [id number] Date of Birth: 02-24-1961  [facility] CM/SW Contact:    Lynnell Catalan, RN Phone Number: 03/14/2021, 11:41 AM  Clinical Narrative:                 Spoke with pt for dc planning. SNF recommendations gone over with pt. She states that she will "not be going to rehab" and wants to go back home with her niece who she said is there with her all the time. She states she is willing to do therapy at home. Pt has used [facility] in the past and she states she would like to use them again. [facility] liaison contacted for referral. Will need MD orders for HHPT/OT.   Expected Discharge Plan: [place] Barriers to Discharge: Continued Medical Work up   Patient Goals and CMS Choice Patient states their goals for this hospitalization and ongoing recovery are:: To go home      Expected Discharge Plan and Services Expected Discharge Plan: [place]   Discharge Planning Services: CM Consult   Living arrangements for the past 2 months: Single Family Home                           HH Arranged: PT, OT HH Agency: [place] Date [facility] Agency Contacted: 03/14/21 Time HH Agency Contacted: 1139 Representative spoke with at [place]: Tommi Rumps  Prior Living Arrangements/Services Living arrangements for the past 2 months: [place] with:: Relatives Patient language and need for interpreter reviewed:: Yes Do you feel safe going back to the place where you live?: Yes      Need for Family Participation in Patient Care: Yes (Comment) Care giver support system in place?: Yes (comment)   Criminal Activity/Legal Involvement Pertinent to Current Situation/Hospitalization: No - Comment as needed  Activities of Daily Living Home Assistive Devices/Equipment: Wheelchair ADL Screening (condition at time of admission) Patient's  cognitive ability adequate to safely complete daily activities?: Yes Is the patient deaf or have difficulty hearing?: No Does the patient have difficulty seeing, even when wearing glasses/contacts?: Yes Does the patient have difficulty concentrating, remembering, or making decisions?: No Patient able to express need for assistance with ADLs?: Yes Does the patient have difficulty dressing or bathing?: No Independently performs ADLs?: Yes (appropriate for developmental age) Does the patient have difficulty walking or climbing stairs?: Yes Weakness of Legs: None Weakness of Arms/Hands: None  Permission Sought/Granted Permission sought to share information with : Chartered certified accountant granted to share information with : Yes, Verbal Permission Granted     Permission granted to share info w AGENCY: [facility]        Emotional Assessment Appearance:: Appears stated age Attitude/Demeanor/Rapport: Gracious Affect (typically observed): Calm Orientation: : Oriented to Self, Oriented to Place, Oriented to Situation, Oriented to  Time Alcohol / Substance Use: Not Applicable Psych Involvement: No (comment)  Admission diagnosis:  Pleural effusion [J90] Brain metastases ([place]) [C79.31] Fall, initial encounter [W19.XXXA] Stage 4 malignant neoplasm of lung, unspecified laterality ([place]) [C34.90] Primary malignant neoplasm of lung with metastasis to brain ([place]) [C34.90, C79.31] Patient Active Problem List   Diagnosis Date Noted   Protein-calorie malnutrition, severe 03/12/2021   Primary malignant neoplasm of lung with metastasis to brain ([place]) 03/11/2021   Exudative pleural effusion    Sinus tachycardia  Pleural effusion    S/P thoracentesis    Hypomagnesemia    Acute on chronic combined systolic and diastolic congestive heart failure (HCC)    Bacteremia associated with intravascular line ([place]) 07/03/2020   Acute blood loss anemia    Renal failure 07/01/2020   AKI (acute  kidney injury) ([place])    Metabolic acidosis    Epistaxis    Seizure-like activity (HCC)    Swelling of lower extremity 05/29/2020   Hypoalbuminemia 05/29/2020   Acute on chronic anemia 04/07/2020   Gastrointestinal hemorrhage 04/07/2020   Acute encephalopathy 04/07/2020   Pancytopenia ([place]) 04/07/2020   Polysubstance abuse ([place]) 04/07/2020   Hypokalemia 10/19/2019   Neutropenia ([place]) 10/05/2019   Malignant neoplasm of right lung ([place]) 09/14/2019   Encounter for antineoplastic chemotherapy 09/14/2019   Encounter for antineoplastic immunotherapy 09/14/2019   Goals of care, counseling/discussion 09/14/2019   Tobacco abuse counseling 09/14/2019   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 ([place])    Alcohol abuse    Acute respiratory failure ([place]) 04/03/2019   Lung mass    Multifocal pneumonia    Major depressive disorder    Major depressive disorder, recurrent episode with mood-congruent psychotic features ([place]) 05/30/2017   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 [place] [address] Phone: [phone] Fax: [phone]     Social Determinants of Health (SDOH) Interventions    Readmission Risk Interventions Readmission Risk Prevention Plan 03/14/2021 07/17/2020 04/11/2020  Transportation Screening Complete Complete Complete  PCP or Specialist Appt within 3-5 Days - - Complete  HRI or [place] - Complete Complete  Social Work Consult for [place] Planning/Counseling - Complete Complete  Palliative Care Screening - Not Applicable Not Applicable  Medication Review Press photographer) Complete Complete Complete  PCP or Specialist appointment within 3-5 days of discharge Complete - -  [place] or Home Care Consult Complete - -  SW Recovery Care/Counseling Consult Complete - -  Palliative Care Screening Complete - -  [place] Patient Refused - -  Some  recent data might be hidden

## 2021-03-14 NOTE — Progress Notes (Signed)
We were called to look at the MRI on this 60 year old female with metastatic lung cancer, now to the brain.  MRI of the brain shows multiple metastasis bilaterally.  There is 1 large cystic metastasis in the right parieto-occipital region.  There is a second to centimeter lesion in the right frontal region.  The rest of the lesions are subcentimeter.  She has been lost to follow-up with oncology for over 6 months.  Palliative care has evaluated the patient.  According to the hospitalist note it appears oncology has not offered a plan for further chemotherapeutic options.  At this point this disease is not curable.  She needs to have a long discussion with the oncologist and with her family and with the hospitalist regarding goals of care.  If the patient would want to consider aggressive treatment such as brain surgery we will be happy to have that discussion with her, but I think all treatment at this point is going to be palliative in nature, and therefore despite the size of the lesion I would not favor surgical intervention at this time.  Please let us know if she wants to consider aggressive therapy and we will do a formal consult and discuss options with her.

## 2021-03-15 DIAGNOSIS — E43 Unspecified severe protein-calorie malnutrition: Secondary | ICD-10-CM | POA: Diagnosis not present

## 2021-03-15 DIAGNOSIS — C3490 Malignant neoplasm of unspecified part of unspecified bronchus or lung: Secondary | ICD-10-CM | POA: Diagnosis not present

## 2021-03-15 DIAGNOSIS — C7931 Secondary malignant neoplasm of brain: Secondary | ICD-10-CM | POA: Diagnosis not present

## 2021-03-15 DIAGNOSIS — Z515 Encounter for palliative care: Secondary | ICD-10-CM | POA: Diagnosis not present

## 2021-03-15 DIAGNOSIS — C3491 Malignant neoplasm of unspecified part of right bronchus or lung: Secondary | ICD-10-CM | POA: Diagnosis not present

## 2021-03-15 DIAGNOSIS — F1010 Alcohol abuse, uncomplicated: Secondary | ICD-10-CM | POA: Diagnosis not present

## 2021-03-15 LAB — CBC WITH DIFFERENTIAL/PLATELET
Abs Immature Granulocytes: 0.1 10*3/uL — ABNORMAL HIGH (ref 0.00–0.07)
Basophils Absolute: 0 10*3/uL (ref 0.0–0.1)
Basophils Relative: 0 %
Eosinophils Absolute: 0 10*3/uL (ref 0.0–0.5)
Eosinophils Relative: 0 %
HCT: 34 % — ABNORMAL LOW (ref 36.0–46.0)
Hemoglobin: 11.3 g/dL — ABNORMAL LOW (ref 12.0–15.0)
Immature Granulocytes: 2 %
Lymphocytes Relative: 25 %
Lymphs Abs: 1.5 10*3/uL (ref 0.7–4.0)
MCH: 34.2 pg — ABNORMAL HIGH (ref 26.0–34.0)
MCHC: 33.2 g/dL (ref 30.0–36.0)
MCV: 103 fL — ABNORMAL HIGH (ref 80.0–100.0)
Monocytes Absolute: 0.6 10*3/uL (ref 0.1–1.0)
Monocytes Relative: 10 %
Neutro Abs: 3.9 10*3/uL (ref 1.7–7.7)
Neutrophils Relative %: 63 %
Platelets: 250 10*3/uL (ref 150–400)
RBC: 3.3 MIL/uL — ABNORMAL LOW (ref 3.87–5.11)
RDW: 15 % (ref 11.5–15.5)
WBC: 6.2 10*3/uL (ref 4.0–10.5)
nRBC: 0 % (ref 0.0–0.2)

## 2021-03-15 LAB — COMPREHENSIVE METABOLIC PANEL
ALT: 20 U/L (ref 0–44)
AST: 26 U/L (ref 15–41)
Albumin: 2.6 g/dL — ABNORMAL LOW (ref 3.5–5.0)
Alkaline Phosphatase: 144 U/L — ABNORMAL HIGH (ref 38–126)
Anion gap: 7 (ref 5–15)
BUN: 55 mg/dL — ABNORMAL HIGH (ref 6–20)
CO2: 23 mmol/L (ref 22–32)
Calcium: 8.8 mg/dL — ABNORMAL LOW (ref 8.9–10.3)
Chloride: 104 mmol/L (ref 98–111)
Creatinine, Ser: 2.25 mg/dL — ABNORMAL HIGH (ref 0.44–1.00)
GFR, Estimated: 25 mL/min — ABNORMAL LOW (ref >60)
Glucose, Bld: 121 mg/dL — ABNORMAL HIGH (ref 70–99)
Potassium: 4 mmol/L (ref 3.5–5.1)
Sodium: 134 mmol/L — ABNORMAL LOW (ref 135–145)
Total Bilirubin: 0.3 mg/dL (ref 0.3–1.2)
Total Protein: 6.7 g/dL (ref 6.5–8.1)

## 2021-03-15 MED ORDER — CHLORHEXIDINE GLUCONATE CLOTH 2 % EX PADS
6.0000 | MEDICATED_PAD | Freq: Every day | CUTANEOUS | Status: DC
  Administered 2021-03-15 – 2021-04-05 (×21): 6 via TOPICAL

## 2021-03-15 NOTE — Progress Notes (Signed)
Daily Progress Note   Patient Name: Brenda [name]       Date: 03/15/2021 DOB: 1960-07-04  Age: 60 y.o. MRN#: [id number] Attending Physician: Alma Friendly, MD Primary Care Physician: Pcp, No Admit Date: 03/11/2021  Reason for Consultation/Follow-up: Establishing goals of care  Subjective: Awake alert, just participated with PT, states she met with radiation and knows that she will have to undergo whole brain radiation. She states that her niece [name] is an Therapist, sports here, she gave me permission to talk to her niece.  See below.  Length of Stay: 4  Current Medications: Scheduled Meds:   carvedilol  3.125 mg Oral BID WC   dexamethasone  4 mg Oral Q8H   feeding supplement  237 mL Oral TID BM   ferrous sulfate  325 mg Oral BID WC   gabapentin  300 mg Oral QHS   heparin  5,000 Units Subcutaneous Q8H   multivitamin with minerals  1 tablet Oral Daily   nicotine  14 mg Transdermal Daily   pantoprazole  40 mg Oral BID   polyethylene glycol  17 g Oral BID   thiamine  100 mg Oral Daily    Continuous Infusions:   PRN Meds: acetaminophen **OR** acetaminophen, ondansetron **OR** ondansetron (ZOFRAN) IV, oxyCODONE  Physical Exam         Awake alert resting in chair Regular work of breathing No edema Muscle wasting Generalized weakness  Vital Signs: BP 136/84 (BP Location: Right Arm)    Pulse 89    Temp 98.2 F (36.8 C) (Oral)    Resp 16    Ht 5' 6"  (1.676 m)    Wt 43.8 kg    LMP 01/19/2011    SpO2 100%    BMI 15.59 kg/m  SpO2: SpO2: 100 % O2 Device: O2 Device: Room Air O2 Flow Rate:    Intake/output summary:  Intake/Output Summary (Last 24 hours) at 03/15/2021 1305 Last data filed at 03/15/2021 7846 Gross per 24 hour  Intake 480 ml  Output 250 ml  Net 230 ml   LBM: Last  BM Date: 03/13/21 Baseline Weight: Weight: 56.7 kg Most recent weight: Weight: 43.8 kg       Palliative Assessment/Data:      Patient Active Problem List   Diagnosis Date Noted   Protein-calorie malnutrition, severe 03/12/2021  Primary malignant neoplasm of lung with metastasis to brain ([place]) 03/11/2021   Exudative pleural effusion    Sinus tachycardia    Pleural effusion    S/P thoracentesis    Hypomagnesemia    Acute on chronic combined systolic and diastolic congestive heart failure (HCC)    Bacteremia associated with intravascular line ([place]) 07/03/2020   Acute blood loss anemia    Renal failure 07/01/2020   AKI (acute kidney injury) ([place])    Metabolic acidosis    Epistaxis    Seizure-like activity (HCC)    Swelling of lower extremity 05/29/2020   Hypoalbuminemia 05/29/2020   Acute on chronic anemia 04/07/2020   Gastrointestinal hemorrhage 04/07/2020   Acute encephalopathy 04/07/2020   Pancytopenia ([place]) 04/07/2020   Polysubstance abuse ([place]) 04/07/2020   Hypokalemia 10/19/2019   Neutropenia ([place]) 10/05/2019   Malignant neoplasm of right lung ([place]) 09/14/2019   Encounter for antineoplastic chemotherapy 09/14/2019   Encounter for antineoplastic immunotherapy 09/14/2019   Goals of care, counseling/discussion 09/14/2019   Tobacco abuse counseling 09/14/2019   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 ([place])    Alcohol abuse    Acute respiratory failure ([place]) 04/03/2019   Lung mass    Multifocal pneumonia    Major depressive disorder    Major depressive disorder, recurrent episode with mood-congruent psychotic features ([place]) 05/30/2017   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 Earlie Server, underlying history of congestive heart failure tobacco use alcohol use hypoalbuminemia and  polysubstance use.  Patient presented with recurrent falls and was found to have MRI of the brain showing large cystic mass as well as multiple lesions.  Recommendations/Plan: Patient has been seen and evaluated by radiation oncology, she is being considered for palliative radiation treatment to the brain with whole brain radiation. Call placed and discussed with niece [name].  [name] states that she is a Designer, jewellery in the PACU here at our hospital.  She states that the patient lives in a hotel with her boyfriend.  She has a son who is not involved with the patient's healthcare management and he might possibly also have polysubstance use as well.  Patient has not prepared a living will has not prepared a healthcare power of attorney agent.  [name] states that she has tried to get the patient to take her oncology appointments however the patient has continued to decline.  We discussed about how this current hospitalization is going and what the MRI of the brain showed.  Her main diasone who brought the patient to the hospital.  Discussed with [name] that PT is recommending skilled nursing facility with rehabilitation and that the patient will benefit from palliative services going forward.  [name] is thankful for information provided.   Goals of Care and Additional Recommendations: Limitations on Scope of Treatment: Full Scope Treatment  Code Status:    Code Status Orders  (From admission, onward)           Start     Ordered   03/11/21 1721  Full code  Continuous        03/11/21 1722           Code Status History     Date Active Date Inactive Code Status Order ID Comments User Context   08/09/2020 2007 08/17/2020 0041 Full Code [id number]  Elwyn Reach, MD Inpatient  07/01/2020 1433 08/03/2020 0414 Full Code [id number]  Gleason, Sissy Hoff ED   04/07/2020 2042 04/11/2020 1909 Full Code [id number]  Rhetta Mura, DO ED   04/03/2019 1507 04/05/2019 2210 Full Code [id number]   Lyndee Hensen, DO ED   05/30/2017 1558 06/03/2017 0006 Full Code [id number]  Patrecia Pour, NP Inpatient   05/29/2017 1508 05/30/2017 1525 Full Code [id number]  Isla Pence, MD ED   12/24/2011 0140 12/25/2011 2040 Full Code [id number]  Truddie Hidden., MD ED       Prognosis:  Unable to determine  Discharge Planning: To Be Determined  Care plan was discussed with  patient, also discussed with niece [name] on phone.   Thank you for allowing the Palliative Medicine Team to assist in the care of this patient.   Time In: 12 Time Out: 12.25 Total Time 25  Prolonged Time Billed  no       Greater than 50%  of this time was spent counseling and coordinating care related to the above assessment and plan.  Loistine Chance, MD  Please contact Palliative Medicine Team phone at [phone] for questions and concerns.

## 2021-03-15 NOTE — Progress Notes (Signed)
PROGRESS NOTE    Brenda [name]  NLZ:[id number] DOB: 10/04/1960 DOA: 03/11/2021 PCP: Pcp, No   Brief Narrative: Brenda [name] is a 60 y.o. female with PMH significant for stage IV lung CA, used to follow-up with Dr. Julien Nordmann, CHF, tobacco abuse, EtOH abuse, hypoalbuminemia, polysubstance abuse. Patient presented secondary to recurrent falls likely secondary to known metastatic lung cancer with brain metastasis. Decadron started. PT ordered. Palliative care consulted.   Assessment & Plan:   Principal Problem:   Primary malignant neoplasm of lung with metastasis to brain [facility]) Active Problems:   Major depressive disorder, recurrent episode with mood-congruent psychotic features ([place])   Alcohol abuse   Malignant neoplasm of right lung (HCC)   Polysubstance abuse (HCC)   Hypoalbuminemia   Acute on chronic combined systolic and diastolic congestive heart failure (HCC)   Protein-calorie malnutrition, severe   Recurrent falls Associated headache, dizziness, weakness and poor appetite Possibly related to metastatic cancer diagnosis Started empirically on Decadron PT recommending SNF  Left pleural effusion In setting of known stage IV lung cancer. 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 Negative viral load Repeat PCR non-reactive. Possibly error or false positive.  CKD stage IIIb Stable Baseline creatinine of about 2 Daily BMP  Alcohol abuse Continue folic acid, thiamine  Polysubstance abuse History of cocaine use  Underweight Severe malnutrition Body mass index is 15.59 kg/m.   DVT prophylaxis: Heparin  Code Status:   Code Status: Full Code Family Communication: None at bedside Disposition Plan: SNF Vs home   Consultants:  Medical oncology Rad Onc Palliative care  Procedures:  None  Antimicrobials: None    Subjective: Patient denies any new complaints, denies any chest pain, shortness of breath, abdominal pain, nausea/vomiting, fever/chills.    Objective: Vitals:   03/14/21 1327 03/14/21 2043 03/15/21 0601 03/15/21 1518  BP: 125/76 135/79 136/84 (!) 135/91  Pulse: 92 97 89 95  Resp: 18 18 16 18   Temp: 97.6 F (36.4 C) 98.9 F (37.2 C) 98.2 F (36.8 C) 98 F (36.7 C)  TempSrc: Oral Oral Oral Oral  SpO2: 98% 97% 100% 98%  Weight:      Height:        Intake/Output Summary (Last 24 hours) at 03/15/2021 1751 Last data filed at 03/15/2021 1229 Gross per 24 hour  Intake 720 ml  Output --  Net 720 ml   Filed Weights   03/11/21 1432 03/12/21 0430  Weight: 56.7 kg 43.8 kg    Examination: General: NAD  Cardiovascular: S1, S2 present Respiratory: CTAB Abdomen: Soft, nontender, nondistended, bowel sounds present Musculoskeletal: No bilateral pedal edema noted Skin: Normal Psychiatry: Normal mood      Data Reviewed: I have personally reviewed following labs and imaging studies  CBC Lab Results  Component Value Date   WBC 6.2 03/15/2021   RBC  3.30 (L) 03/15/2021   HGB 11.3 (L) 03/15/2021   HCT 34.0 (L) 03/15/2021   MCV 103.0 (H) 03/15/2021   MCH 34.2 (H) 03/15/2021   PLT 250 03/15/2021   MCHC 33.2 03/15/2021   RDW 15.0 03/15/2021   LYMPHSABS 1.5  03/15/2021   MONOABS 0.6 03/15/2021   EOSABS 0.0 03/15/2021   BASOSABS 0.0 18/29/9371     Last metabolic panel Lab Results  Component Value Date   NA 134 (L) 03/15/2021   K 4.0 03/15/2021   CL 104 03/15/2021   CO2 23 03/15/2021   BUN 55 (H) 03/15/2021   CREATININE 2.25 (H) 03/15/2021   GLUCOSE 121 (H) 03/15/2021   GFRNONAA 25 (L) 03/15/2021   GFRAA  07/01/2020    QUESTIONABLE RESULTS, RECOMMEND RECOLLECT TO VERIFY   CALCIUM 8.8 (L) 03/15/2021   PHOS 3.9 03/12/2021   PROT 6.7 03/15/2021   ALBUMIN 2.6 (L) 03/15/2021   BILITOT 0.3 03/15/2021   ALKPHOS 144 (H) 03/15/2021   AST 26 03/15/2021   ALT 20 03/15/2021   ANIONGAP 7 03/15/2021    CBG (last 3)  No results for input(s): GLUCAP in the last 72 hours.   GFR: Estimated Creatinine Clearance: 18.8 mL/min (A) (by C-G formula based on SCr of 2.25 mg/dL (H)).  Coagulation Profile: No results for input(s): INR, PROTIME in the last 168 hours.  Recent Results (from the past 240 hour(s))  Resp Panel by RT-PCR (Flu A&B, Covid) Nasopharyngeal Swab     Status: None   Collection Time: 03/11/21  3:17 PM   Specimen: Nasopharyngeal Swab; Nasopharyngeal(NP) swabs in vial transport medium  Result Value Ref Range Status   SARS Coronavirus 2 by RT PCR NEGATIVE NEGATIVE Final    Comment: (NOTE) SARS-CoV-2 target nucleic acids are NOT DETECTED.  The SARS-CoV-2 RNA is generally detectable in upper respiratory specimens during the acute phase of infection. The lowest concentration of SARS-CoV-2 viral copies this assay can detect is 138 copies/mL. A negative result does not preclude SARS-Cov-2 infection and should not be used as the sole basis for treatment or other patient management decisions. A negative result may occur with  improper specimen collection/handling, submission of specimen other than nasopharyngeal swab, presence of viral mutation(s) within the areas targeted by this assay, and inadequate number of viral copies(<138  copies/mL). A negative result must be combined with clinical observations, patient history, and epidemiological information. The expected result is Negative.  Fact Sheet for Patients:  EntrepreneurPulse.com.au  Fact Sheet for Healthcare Providers:  IncredibleEmployment.be  This test is no t yet approved or cleared by the [place] FDA and  has been authorized for detection and/or diagnosis of SARS-CoV-2 by FDA under an Emergency Use Authorization (EUA). This EUA will remain  in effect (meaning this test can be used) for the duration of the COVID-19 declaration under Section 564(b)(1) of the Act, 21 U.S.C.section 360bbb-3(b)(1), unless the authorization is terminated  or revoked sooner.       Influenza A by PCR NEGATIVE NEGATIVE Final   Influenza B by PCR NEGATIVE NEGATIVE Final    Comment: (NOTE) The Xpert Xpress SARS-CoV-2/FLU/RSV plus assay is intended as an aid in the diagnosis of influenza from Nasopharyngeal swab specimens and should not be used as a sole basis for treatment. Nasal washings and aspirates are unacceptable for Xpert Xpress SARS-CoV-2/FLU/RSV testing.  Fact Sheet for Patients: EntrepreneurPulse.com.au  Fact Sheet for Healthcare Providers: IncredibleEmployment.be  This test is not yet approved or cleared by the [place] FDA and has  been authorized for detection and/or diagnosis of SARS-CoV-2 by FDA under an Emergency Use Authorization (EUA). This EUA will remain in effect (meaning this test can be used) for the duration of the COVID-19 declaration under Section 564(b)(1) of the Act, 21 U.S.C. section 360bbb-3(b)(1), unless the authorization is terminated or revoked.  Performed at [facility], [place] [address] [place], [place] [address]         Radiology Studies: No results found.      Scheduled Meds:  carvedilol  3.125 mg Oral BID WC    dexamethasone  4 mg Oral Q8H   feeding supplement  237 mL Oral TID BM   ferrous sulfate  325 mg Oral BID WC   gabapentin  300 mg Oral QHS   heparin  5,000 Units Subcutaneous Q8H   multivitamin with minerals  1 tablet Oral Daily   nicotine  14 mg Transdermal Daily   pantoprazole  40 mg Oral BID   polyethylene glycol  17 g Oral BID   thiamine  100 mg Oral Daily   Continuous Infusions:     LOS: 4 days     Alma Friendly, MD [facility] 03/15/2021, 5:51 PM  If 7PM-7AM, please contact night-coverage [email or web address]

## 2021-03-15 NOTE — Progress Notes (Signed)
Occupational Therapy Treatment Patient Details Name: Brenda [name] MRN: [id number] DOB: April 22, 1960 Today's Date: 03/15/2021   History of present illness 60 years old female adm with AMS/confusion and falls. 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  Large cystic mass in the right occipital parietal lobe with prior  hemorrhage and local mass-effect. Multiple additional metastatic  lesions as above".  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 Pt refused recommended SNF level rehab following last hospital admit.   OT comments  Patient was noted to have continued poor safety awareness, L side neglect, poor motor control and problem solving impacting participation in ADLs. Patient attempted to participate in HEP of AROM of LUE with stress ball squeezes as well. Patient required min A to complete tasks appropriately. Patient was mod A for transfer with RW with education for proper hand and foot placement for entire transfer to L side with patient unable to implement education without continued cues.patient was encouraged by this Probation officer and nurse benefits of SNF at time of d/c. Patient's discharge plan remains appropriate at this time. OT will continue to follow acutely.      Recommendations for follow up therapy are one component of a multi-disciplinary discharge planning process, led by the attending physician.  Recommendations may be updated based on patient status, additional functional criteria and insurance authorization.    Follow Up Recommendations  Skilled nursing-short term rehab (<3 hours/day)    Assistance Recommended at Discharge Frequent or constant Supervision/Assistance  Equipment Recommendations       Recommendations for Other Services      Precautions / Restrictions Precautions Precautions: Fall Restrictions Weight Bearing  Restrictions: No       Mobility Bed Mobility                    Transfers                         Balance                                           ADL either performed or assessed with clinical judgement   ADL Overall ADL's : Needs assistance/impaired     Grooming: Sitting;Oral care;Set up;Supervision/safety;Cueing for safety;Cueing for sequencing Grooming Details (indicate cue type and reason): patient completed sitting in bed with continued report :" i am sitting on edge of bed". patiernt was moe agreeable with nurse in room to participate in out of bed tasks.                               General ADL Comments: patient was min guard for supine to sit on edge of bed. patient required cues for slow down and attend to task. patient was mod A to trasnfer from edge of bed to recliner in room with patient noted to get LLE stuck under RLE with patient unable to follow commands to adjust standing positioning. patient was maX A to sit down on edge of chair to adjust feet prior. paient was provided with tissue box next to patient in recliner with patient reporting she cannot find it. patient was educated on scanning techniques to locate tissue box with patient unable to follow cues  and needed increased time to follow therapists arm that was touching box to that spot. patient 's nurse was in room for session.    Extremity/Trunk Assessment              Vision       Perception     Praxis      Cognition Arousal/Alertness: Awake/alert Behavior During Therapy: Impulsive;Anxious Overall Cognitive Status: No family/caregiver present to determine baseline cognitive functioning                                 General Comments: patient was noted to have decreased safety awareness, poor short term memory, decreased attention to tasks, decreased problem solving and motor planning. patient was quick to get frustrated with  tasks.          Exercises Other Exercises Other Exercises: patient was educated on HEP exercises with patient participation in LUE AROM tasks with stress ball provided to work on coordination. patient was easily distracted during session with patient noted to have difficulty with coordination of all digits on ball to participate in finger flexion tasks.   Shoulder Instructions       General Comments      Pertinent Vitals/ Pain       Pain Assessment: No/denies pain  Home Living                                          Prior Functioning/Environment              Frequency  Min 2X/week        Progress Toward Goals  OT Goals(current goals can now be found in the care plan section)  Progress towards OT goals: Progressing toward goals     Plan Discharge plan remains appropriate    Co-evaluation                 AM-PAC OT "6 Clicks" Daily Activity     Outcome Measure   Help from another person eating meals?: A Little Help from another person taking care of personal grooming?: A Little Help from another person toileting, which includes using toliet, bedpan, or urinal?: A Little Help from another person bathing (including washing, rinsing, drying)?: A Little Help from another person to put on and taking off regular upper body clothing?: A Little Help from another person to put on and taking off regular lower body clothing?: A Lot 6 Click Score: 17    End of Session Equipment Utilized During Treatment: Gait belt;Rolling walker (2 wheels)  OT Visit Diagnosis: Unsteadiness on feet (R26.81);Hemiplegia and hemiparesis;History of falling (Z91.81);Repeated falls (R29.6);Muscle weakness (generalized) (M62.81);Other symptoms and signs involving cognitive function;Apraxia (R48.2);Other symptoms and signs involving the nervous system (R29.898) Hemiplegia - Right/Left: Left   Activity Tolerance Patient tolerated treatment well   Patient Left in  chair;with call bell/phone within reach;with chair alarm set   Nurse Communication Mobility status        Time: 6010-9323 OT Time Calculation (min): 33 min  Charges: OT General Charges $OT Visit: 1 Visit OT Treatments $Self Care/Home Management : 8-22 mins $Therapeutic Activity: 8-22 mins  Jackelyn Poling OTR/L, MS Acute Rehabilitation Department Office# [phone] Pager# [phone]   Brenda [name] 03/15/2021, 10:23 AM

## 2021-03-16 DIAGNOSIS — C3491 Malignant neoplasm of unspecified part of right bronchus or lung: Secondary | ICD-10-CM | POA: Diagnosis not present

## 2021-03-16 DIAGNOSIS — F1010 Alcohol abuse, uncomplicated: Secondary | ICD-10-CM | POA: Diagnosis not present

## 2021-03-16 DIAGNOSIS — C3490 Malignant neoplasm of unspecified part of unspecified bronchus or lung: Secondary | ICD-10-CM | POA: Diagnosis not present

## 2021-03-16 DIAGNOSIS — C7931 Secondary malignant neoplasm of brain: Secondary | ICD-10-CM | POA: Diagnosis not present

## 2021-03-16 LAB — CBC WITH DIFFERENTIAL/PLATELET
Abs Immature Granulocytes: 0.13 10*3/uL — ABNORMAL HIGH (ref 0.00–0.07)
Basophils Absolute: 0 10*3/uL (ref 0.0–0.1)
Basophils Relative: 0 %
Eosinophils Absolute: 0 10*3/uL (ref 0.0–0.5)
Eosinophils Relative: 0 %
HCT: 32.3 % — ABNORMAL LOW (ref 36.0–46.0)
Hemoglobin: 10.3 g/dL — ABNORMAL LOW (ref 12.0–15.0)
Immature Granulocytes: 2 %
Lymphocytes Relative: 22 %
Lymphs Abs: 1.9 10*3/uL (ref 0.7–4.0)
MCH: 33.6 pg (ref 26.0–34.0)
MCHC: 31.9 g/dL (ref 30.0–36.0)
MCV: 105.2 fL — ABNORMAL HIGH (ref 80.0–100.0)
Monocytes Absolute: 0.7 10*3/uL (ref 0.1–1.0)
Monocytes Relative: 9 %
Neutro Abs: 5.5 10*3/uL (ref 1.7–7.7)
Neutrophils Relative %: 67 %
Platelets: 252 10*3/uL (ref 150–400)
RBC: 3.07 MIL/uL — ABNORMAL LOW (ref 3.87–5.11)
RDW: 15.6 % — ABNORMAL HIGH (ref 11.5–15.5)
WBC: 8.3 10*3/uL (ref 4.0–10.5)
nRBC: 0 % (ref 0.0–0.2)

## 2021-03-16 LAB — COMPREHENSIVE METABOLIC PANEL
ALT: 32 U/L (ref 0–44)
AST: 32 U/L (ref 15–41)
Albumin: 2.4 g/dL — ABNORMAL LOW (ref 3.5–5.0)
Alkaline Phosphatase: 125 U/L (ref 38–126)
Anion gap: 5 (ref 5–15)
BUN: 65 mg/dL — ABNORMAL HIGH (ref 6–20)
CO2: 25 mmol/L (ref 22–32)
Calcium: 8.9 mg/dL (ref 8.9–10.3)
Chloride: 105 mmol/L (ref 98–111)
Creatinine, Ser: 2.4 mg/dL — ABNORMAL HIGH (ref 0.44–1.00)
GFR, Estimated: 23 mL/min — ABNORMAL LOW (ref >60)
Glucose, Bld: 114 mg/dL — ABNORMAL HIGH (ref 70–99)
Potassium: 5.3 mmol/L — ABNORMAL HIGH (ref 3.5–5.1)
Sodium: 135 mmol/L (ref 135–145)
Total Bilirubin: 0.5 mg/dL (ref 0.3–1.2)
Total Protein: 6.3 g/dL — ABNORMAL LOW (ref 6.5–8.1)

## 2021-03-16 MED ORDER — CARVEDILOL 6.25 MG PO TABS
6.2500 mg | ORAL_TABLET | Freq: Two times a day (BID) | ORAL | Status: DC
  Administered 2021-03-16 – 2021-04-05 (×40): 6.25 mg via ORAL
  Filled 2021-03-16 (×41): qty 1

## 2021-03-16 MED ORDER — SENNOSIDES-DOCUSATE SODIUM 8.6-50 MG PO TABS
1.0000 | ORAL_TABLET | Freq: Two times a day (BID) | ORAL | Status: DC
  Administered 2021-03-16 – 2021-04-02 (×23): 1 via ORAL
  Filled 2021-03-16 (×31): qty 1

## 2021-03-16 MED ORDER — SODIUM ZIRCONIUM CYCLOSILICATE 10 G PO PACK
10.0000 g | PACK | Freq: Once | ORAL | Status: AC
  Administered 2021-03-16: 11:00:00 10 g via ORAL
  Filled 2021-03-16: qty 1

## 2021-03-16 NOTE — Progress Notes (Signed)
PROGRESS NOTE    Brenda [name]  OJJ:[id number] DOB: [birth year] DOA: 03/11/2021 PCP: Pcp, No   Brief Narrative: Brenda [name] is a 60 y.o. female with PMH significant for stage IV lung CA, used to follow-up with Dr. Julien Nordmann, CHF, tobacco abuse, EtOH abuse, hypoalbuminemia, polysubstance abuse. Patient presented secondary to recurrent falls likely secondary to known metastatic lung cancer with brain metastasis. Decadron started. PT ordered. Palliative care consulted.   Assessment & Plan:   Principal Problem:   Primary malignant neoplasm of lung with metastasis to brain [facility]) Active Problems:   Major depressive disorder, recurrent episode with mood-congruent psychotic features ([place])   Alcohol abuse   Malignant neoplasm of right lung (HCC)   Polysubstance abuse (HCC)   Hypoalbuminemia   Acute on chronic combined systolic and diastolic congestive heart failure (HCC)   Protein-calorie malnutrition, severe   Recurrent falls Associated headache, dizziness, weakness and poor appetite Possibly related to metastatic cancer diagnosis Started empirically on Decadron PT recommending SNF  Left pleural effusion In setting of known stage IV lung cancer. 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 Negative viral load Repeat PCR non-reactive. Possibly error or false positive.  CKD stage IIIb Stable Baseline creatinine of about 2 Daily BMP  Anemia of chronic kidney disease/malignancy Hemoglobin stable around baseline Daily CBC  Hyperkalemia Received 1 dose of [place]ma Daily BMP  Alcohol abuse Continue folic acid, thiamine  Polysubstance abuse History of cocaine use  Underweight Severe malnutrition Body mass index is 15.59 kg/m.   DVT prophylaxis: Heparin  Code Status:   Code Status: Full Code Family Communication: None at bedside Disposition Plan: SNF Vs home   Consultants:  Medical oncology Rad Onc Palliative care  Procedures:  None  Antimicrobials: None    Subjective: Patient denies any new complaints.  Just reports pain constipated.  Strict bowel regimen    Objective: Vitals:   03/15/21 0601 03/15/21 1518 03/15/21 2046 03/16/21 0608  BP: 136/84 (!) 135/91 (!) 141/85 (!) 173/102  Pulse: 89 95 88 80  Resp: 16 18 15 14   Temp: 98.2 F (36.8 C) 98 F (36.7 C) 98.2 F (36.8 C) 97.6 F (36.4 C)  TempSrc: Oral Oral Oral Oral  SpO2: 100% 98% 99% 99%  Weight:      Height:        Intake/Output Summary (Last 24 hours) at 03/16/2021 1339 Last data filed at 03/16/2021 0913 Gross per 24 hour  Intake 480 ml  Output 500 ml  Net -20 ml   Filed Weights   03/11/21 1432 03/12/21 0430  Weight: 56.7 kg 43.8 kg    Examination: General: NAD  Cardiovascular: S1, S2 present Respiratory: CTAB Abdomen: Soft, nontender, nondistended, bowel sounds present Musculoskeletal: No bilateral pedal edema noted Skin: Normal Psychiatry: Normal mood      Data Reviewed: I have personally  reviewed following labs and imaging studies  CBC Lab Results  Component Value Date   WBC 8.3 03/16/2021   RBC 3.07 (L) 03/16/2021   HGB 10.3 (L) 03/16/2021   HCT 32.3 (L) 03/16/2021   MCV 105.2 (H)  03/16/2021   MCH 33.6 03/16/2021   PLT 252 03/16/2021   MCHC 31.9 03/16/2021   RDW 15.6 (H) 03/16/2021   LYMPHSABS 1.9 03/16/2021   MONOABS 0.7 03/16/2021   EOSABS 0.0 03/16/2021   BASOSABS 0.0 41/93/7902     Last metabolic panel Lab Results  Component Value Date   NA 135 03/16/2021   K 5.3 (H) 03/16/2021   CL 105 03/16/2021   CO2 25 03/16/2021   BUN 65 (H) 03/16/2021   CREATININE 2.40 (H) 03/16/2021   GLUCOSE 114 (H) 03/16/2021   GFRNONAA 23 (L) 03/16/2021   GFRAA  07/01/2020    QUESTIONABLE RESULTS, RECOMMEND RECOLLECT TO VERIFY   CALCIUM 8.9 03/16/2021   PHOS 3.9 03/12/2021   PROT 6.3 (L) 03/16/2021   ALBUMIN 2.4 (L) 03/16/2021   BILITOT 0.5 03/16/2021   ALKPHOS 125 03/16/2021   AST 32 03/16/2021   ALT 32 03/16/2021   ANIONGAP 5 03/16/2021    CBG (last 3)  No results for input(s): GLUCAP in the last 72 hours.   GFR: Estimated Creatinine Clearance: 17.7 mL/min (A) (by C-G formula based on SCr of 2.4 mg/dL (H)).  Coagulation Profile: No results for input(s): INR, PROTIME in the last 168 hours.  Recent Results (from the past 240 hour(s))  Resp Panel by RT-PCR (Flu A&B, Covid) Nasopharyngeal Swab     Status: None   Collection Time: 03/11/21  3:17 PM   Specimen: Nasopharyngeal Swab; Nasopharyngeal(NP) swabs in vial transport medium  Result Value Ref Range Status   SARS Coronavirus 2 by RT PCR NEGATIVE NEGATIVE Final    Comment: (NOTE) SARS-CoV-2 target nucleic acids are NOT DETECTED.  The SARS-CoV-2 RNA is generally detectable in upper respiratory specimens during the acute phase of infection. The lowest concentration of SARS-CoV-2 viral copies this assay can detect is 138 copies/mL. A negative result does not preclude SARS-Cov-2 infection and should not be used as the sole basis for treatment or other patient management decisions. A negative result may occur with  improper specimen collection/handling, submission of specimen other than nasopharyngeal swab,  presence of viral mutation(s) within the areas targeted by this assay, and inadequate number of viral copies(<138 copies/mL). A negative result must be combined with clinical observations, patient history, and epidemiological information. The expected result is Negative.  Fact Sheet for Patients:  EntrepreneurPulse.com.au  Fact Sheet for Healthcare Providers:  IncredibleEmployment.be  This test is no t yet approved or cleared by the [place] FDA and  has been authorized for detection and/or diagnosis of SARS-CoV-2 by FDA under an Emergency Use Authorization (EUA). This EUA will remain  in effect (meaning this test can be used) for the duration of the COVID-19 declaration under Section 564(b)(1) of the Act, 21 U.S.C.section 360bbb-3(b)(1), unless the authorization is terminated  or revoked sooner.       Influenza A by PCR NEGATIVE NEGATIVE Final   Influenza B by PCR NEGATIVE NEGATIVE Final    Comment: (NOTE) The Xpert Xpress SARS-CoV-2/FLU/RSV plus assay is intended as an aid in the diagnosis of influenza from Nasopharyngeal swab specimens and should not be used as a sole basis for treatment. Nasal washings and aspirates are unacceptable for Xpert Xpress SARS-CoV-2/FLU/RSV testing.  Fact Sheet for Patients: EntrepreneurPulse.com.au  Fact  Sheet for Healthcare Providers: IncredibleEmployment.be  This test is not yet approved or cleared by the [place] and has been authorized for detection and/or diagnosis of SARS-CoV-2 by FDA under an Emergency Use Authorization (EUA). This EUA will remain in effect (meaning this test can be used) for the duration of the COVID-19 declaration under Section 564(b)(1) of the Act, 21 U.S.C. section 360bbb-3(b)(1), unless the authorization is terminated or revoked.  Performed at [facility], [place] [address], [place], [place] [address]          Radiology Studies: No results found.      Scheduled Meds:  carvedilol  6.25 mg Oral BID   Chlorhexidine Gluconate Cloth  6 each Topical Daily   dexamethasone  4 mg Oral Q8H   feeding supplement  237 mL Oral TID BM   ferrous sulfate  325 mg Oral BID WC   gabapentin  300 mg Oral QHS   heparin  5,000 Units Subcutaneous Q8H   multivitamin with minerals  1 tablet Oral Daily   nicotine  14 mg Transdermal Daily   pantoprazole  40 mg Oral BID   polyethylene glycol  17 g Oral BID   senna-docusate  1 tablet Oral BID   thiamine  100 mg Oral Daily   Continuous Infusions:     LOS: 5 days     Alma Friendly, MD [facility] 03/16/2021, 1:39 PM  If 7PM-7AM, please contact night-coverage [email or web address]

## 2021-03-17 DIAGNOSIS — C3490 Malignant neoplasm of unspecified part of unspecified bronchus or lung: Secondary | ICD-10-CM | POA: Diagnosis not present

## 2021-03-17 DIAGNOSIS — F1010 Alcohol abuse, uncomplicated: Secondary | ICD-10-CM | POA: Diagnosis not present

## 2021-03-17 DIAGNOSIS — Z515 Encounter for palliative care: Secondary | ICD-10-CM | POA: Diagnosis not present

## 2021-03-17 DIAGNOSIS — C3491 Malignant neoplasm of unspecified part of right bronchus or lung: Secondary | ICD-10-CM

## 2021-03-17 DIAGNOSIS — C7931 Secondary malignant neoplasm of brain: Secondary | ICD-10-CM | POA: Diagnosis not present

## 2021-03-17 LAB — CBC WITH DIFFERENTIAL/PLATELET
Abs Immature Granulocytes: 0.15 10*3/uL — ABNORMAL HIGH (ref 0.00–0.07)
Basophils Absolute: 0 10*3/uL (ref 0.0–0.1)
Basophils Relative: 0 %
Eosinophils Absolute: 0 10*3/uL (ref 0.0–0.5)
Eosinophils Relative: 0 %
HCT: 31.4 % — ABNORMAL LOW (ref 36.0–46.0)
Hemoglobin: 10.1 g/dL — ABNORMAL LOW (ref 12.0–15.0)
Immature Granulocytes: 2 %
Lymphocytes Relative: 24 %
Lymphs Abs: 1.5 10*3/uL (ref 0.7–4.0)
MCH: 34.2 pg — ABNORMAL HIGH (ref 26.0–34.0)
MCHC: 32.2 g/dL (ref 30.0–36.0)
MCV: 106.4 fL — ABNORMAL HIGH (ref 80.0–100.0)
Monocytes Absolute: 0.5 10*3/uL (ref 0.1–1.0)
Monocytes Relative: 8 %
Neutro Abs: 4.1 10*3/uL (ref 1.7–7.7)
Neutrophils Relative %: 66 %
Platelets: 243 10*3/uL (ref 150–400)
RBC: 2.95 MIL/uL — ABNORMAL LOW (ref 3.87–5.11)
RDW: 16.1 % — ABNORMAL HIGH (ref 11.5–15.5)
WBC: 6.2 10*3/uL (ref 4.0–10.5)
nRBC: 0 % (ref 0.0–0.2)

## 2021-03-17 LAB — COMPREHENSIVE METABOLIC PANEL
ALT: 61 U/L — ABNORMAL HIGH (ref 0–44)
AST: 54 U/L — ABNORMAL HIGH (ref 15–41)
Albumin: 2.4 g/dL — ABNORMAL LOW (ref 3.5–5.0)
Alkaline Phosphatase: 124 U/L (ref 38–126)
Anion gap: 5 (ref 5–15)
BUN: 69 mg/dL — ABNORMAL HIGH (ref 6–20)
CO2: 26 mmol/L (ref 22–32)
Calcium: 8.7 mg/dL — ABNORMAL LOW (ref 8.9–10.3)
Chloride: 105 mmol/L (ref 98–111)
Creatinine, Ser: 2.48 mg/dL — ABNORMAL HIGH (ref 0.44–1.00)
GFR, Estimated: 22 mL/min — ABNORMAL LOW (ref >60)
Glucose, Bld: 110 mg/dL — ABNORMAL HIGH (ref 70–99)
Potassium: 4.9 mmol/L (ref 3.5–5.1)
Sodium: 136 mmol/L (ref 135–145)
Total Bilirubin: 0.4 mg/dL (ref 0.3–1.2)
Total Protein: 6.1 g/dL — ABNORMAL LOW (ref 6.5–8.1)

## 2021-03-17 NOTE — Progress Notes (Signed)
PROGRESS NOTE    Brenda [name]  FYB:[id number] DOB: [birth year] DOA: 03/11/2021 PCP: Pcp, No   Brief Narrative: Brenda [name] is a 60 y.o. female with PMH significant for stage IV lung CA, used to follow-up with Dr. Julien Nordmann, CHF, tobacco abuse, EtOH abuse, hypoalbuminemia, polysubstance abuse. Patient presented secondary to recurrent falls likely secondary to known metastatic lung cancer with brain metastasis. Decadron started. PT ordered. Palliative care consulted.   Assessment & Plan:   Principal Problem:   Primary malignant neoplasm of lung with metastasis to brain [facility]) Active Problems:   Major depressive disorder, recurrent episode with mood-congruent psychotic features ([place])   Alcohol abuse   Malignant neoplasm of right lung (HCC)   Polysubstance abuse (HCC)   Hypoalbuminemia   Acute on chronic combined systolic and diastolic congestive heart failure (HCC)   Protein-calorie malnutrition, severe   Recurrent falls Associated headache, dizziness, weakness and poor appetite Possibly related to metastatic cancer diagnosis Started empirically on Decadron PT recommending SNF  Left pleural effusion In setting of known stage IV lung cancer. 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 Negative viral load Repeat PCR non-reactive. Possibly error or false positive.  CKD stage IIIb Stable Baseline creatinine of about 2 Daily BMP  Anemia of chronic kidney disease/malignancy Hemoglobin stable around baseline Daily CBC  Hyperkalemia Received 1 dose of Lokelma on 03/16/21 Daily BMP  Alcohol abuse Continue folic acid, thiamine  Polysubstance abuse History of cocaine use  Underweight Severe malnutrition Body mass index is 15.59 kg/m.   DVT prophylaxis: Heparin  Code Status:   Code Status: Full Code Family Communication: None at bedside Disposition Plan: SNF Vs home   Consultants:  Medical oncology Rad Onc Palliative care  Procedures:  None  Antimicrobials: None    Subjective: Patient denies any new complaints.  Patient had a bowel movement on 03/16/2021    Objective: Vitals:   03/16/21 1340 03/16/21 2049 03/17/21 0428 03/17/21 1335  BP: 106/72 (!) 142/77 136/83 130/85  Pulse: (!) 103 96 82 88  Resp: 16 18 18 16   Temp: 98.4 F (36.9 C) 98.3 F (36.8 C) 97.7 F (36.5 C) 98.1 F (36.7 C)  TempSrc: Oral Oral Oral Oral  SpO2: 99% 100% 98% 99%  Weight:      Height:        Intake/Output Summary (Last 24 hours) at 03/17/2021 1535 Last data filed at 03/17/2021 1500 Gross per 24 hour  Intake 600 ml  Output 2250 ml  Net -1650 ml   Filed Weights   03/11/21 1432 03/12/21 0430  Weight: 56.7 kg 43.8 kg    Examination: General: NAD, chronically ill appearing  Cardiovascular: S1, S2 present Respiratory: CTAB Abdomen: Soft, nontender, nondistended, bowel sounds present Musculoskeletal: No bilateral pedal edema noted Skin: Normal Psychiatry: Normal mood      Data Reviewed:  I have personally reviewed following labs and imaging studies  CBC Lab Results  Component Value Date   WBC 6.2 03/17/2021   RBC 2.95 (L) 03/17/2021   HGB 10.1 (L) 03/17/2021   HCT 31.4 (L)  03/17/2021   MCV 106.4 (H) 03/17/2021   MCH 34.2 (H) 03/17/2021   PLT 243 03/17/2021   MCHC 32.2 03/17/2021   RDW 16.1 (H) 03/17/2021   LYMPHSABS 1.5 03/17/2021   MONOABS 0.5 03/17/2021   EOSABS 0.0 03/17/2021   BASOSABS 0.0 70/96/2836     Last metabolic panel Lab Results  Component Value Date   NA 136 03/17/2021   K 4.9 03/17/2021   CL 105 03/17/2021   CO2 26 03/17/2021   BUN 69 (H) 03/17/2021   CREATININE 2.48 (H) 03/17/2021   GLUCOSE 110 (H) 03/17/2021   GFRNONAA 22 (L) 03/17/2021   GFRAA  07/01/2020    QUESTIONABLE RESULTS, RECOMMEND RECOLLECT TO VERIFY   CALCIUM 8.7 (L) 03/17/2021   PHOS 3.9 03/12/2021   PROT 6.1 (L) 03/17/2021   ALBUMIN 2.4 (L) 03/17/2021   BILITOT 0.4 03/17/2021   ALKPHOS 124 03/17/2021   AST 54 (H) 03/17/2021   ALT 61 (H) 03/17/2021   ANIONGAP 5 03/17/2021    CBG (last 3)  No results for input(s): GLUCAP in the last 72 hours.   GFR: Estimated Creatinine Clearance: 17.1 mL/min (A) (by C-G formula based on SCr of 2.48 mg/dL (H)).  Coagulation Profile: No results for input(s): INR, PROTIME in the last 168 hours.  Recent Results (from the past 240 hour(s))  Resp Panel by RT-PCR (Flu A&B, Covid) Nasopharyngeal Swab     Status: None   Collection Time: 03/11/21  3:17 PM   Specimen: Nasopharyngeal Swab; Nasopharyngeal(NP) swabs in vial transport medium  Result Value Ref Range Status   SARS Coronavirus 2 by RT PCR NEGATIVE NEGATIVE Final    Comment: (NOTE) SARS-CoV-2 target nucleic acids are NOT DETECTED.  The SARS-CoV-2 RNA is generally detectable in upper respiratory specimens during the acute phase of infection. The lowest concentration of SARS-CoV-2 viral copies this assay can detect is 138 copies/mL. A negative result does not preclude SARS-Cov-2 infection and should not be used as the sole basis for treatment or other patient management decisions. A negative result may occur with  improper specimen collection/handling, submission of  specimen other than nasopharyngeal swab, presence of viral mutation(s) within the areas targeted by this assay, and inadequate number of viral copies(<138 copies/mL). A negative result must be combined with clinical observations, patient history, and epidemiological information. The expected result is Negative.  Fact Sheet for Patients:  EntrepreneurPulse.com.au  Fact Sheet for Healthcare Providers:  IncredibleEmployment.be  This test is no t yet approved or cleared by the [place] FDA and  has been authorized for detection and/or diagnosis of SARS-CoV-2 by FDA under an Emergency Use Authorization (EUA). This EUA will remain  in effect (meaning this test can be used) for the duration of the COVID-19 declaration under Section 564(b)(1) of the Act, 21 U.S.C.section 360bbb-3(b)(1), unless the authorization is terminated  or revoked sooner.       Influenza A by PCR NEGATIVE NEGATIVE Final   Influenza B by PCR NEGATIVE NEGATIVE Final    Comment: (NOTE) The Xpert Xpress SARS-CoV-2/FLU/RSV plus assay is intended as an aid in the diagnosis of influenza from Nasopharyngeal swab specimens and should not be used as a sole basis for treatment. Nasal washings and aspirates are unacceptable for Xpert Xpress SARS-CoV-2/FLU/RSV testing.  Fact  Sheet for Patients: EntrepreneurPulse.com.au  Fact Sheet for Healthcare Providers: IncredibleEmployment.be  This test is not yet approved or cleared by the [place] FDA and has been authorized for detection and/or diagnosis of SARS-CoV-2 by FDA under an Emergency Use Authorization (EUA). This EUA will remain in effect (meaning this test can be used) for the duration of the COVID-19 declaration under Section 564(b)(1) of the Act, 21 U.S.C. section 360bbb-3(b)(1), unless the authorization is terminated or revoked.  Performed at [facility], [place]  [address], [place], [place] [address]         Radiology Studies: No results found.      Scheduled Meds:  carvedilol  6.25 mg Oral BID   Chlorhexidine Gluconate Cloth  6 each Topical Daily   dexamethasone  4 mg Oral Q8H   feeding supplement  237 mL Oral TID BM   ferrous sulfate  325 mg Oral BID WC   gabapentin  300 mg Oral QHS   heparin  5,000 Units Subcutaneous Q8H   multivitamin with minerals  1 tablet Oral Daily   nicotine  14 mg Transdermal Daily   pantoprazole  40 mg Oral BID   polyethylene glycol  17 g Oral BID   senna-docusate  1 tablet Oral BID   thiamine  100 mg Oral Daily   Continuous Infusions:     LOS: 6 days     Alma Friendly, MD [facility] 03/17/2021, 3:35 PM  If 7PM-7AM, please contact night-coverage [email or web address]

## 2021-03-17 NOTE — Progress Notes (Signed)
Physical Therapy Treatment Patient Details Name: Brenda [name] MRN: [id number] DOB: 06-28-1960 Today's Date: 03/17/2021   History of Present Illness 60 years old female adm with AMS/confusion and falls. 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  Large cystic mass in the right occipital parietal lobe with prior  hemorrhage and local mass-effect. Multiple additional metastatic  lesions as above".  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 Pt refused recommended SNF level rehab following last hospital admit.    PT Comments    Patient very anxious and stating that she cannot feel the left  arm. Patient  with noted decreased  active movement in hand, unable to raise with control, arm flops , elbow bends.Patient has impaired LT on the  left hand to shoulder, even with DPS.  LLE noted with decreased motor control, able to flex with hip ER, but difficulty extending leg straight, rests with knee flexed. Patient with absent LT on LLE, feels deep pressure on heel.  Patient declined to work on mobility and  sitting for PT to assess, balance. Patient stating that she needs  a BM and was focussed on that. Continue PT and ssess for neurological changes on L extremities.Appears to have  neurological  changes on LU/LE.  PT documented patient ambulate x 160' using Rw with min assist  on 12/22  Recommendations for follow up therapy are one component of a multi-disciplinary discharge planning process, led by the attending physician.  Recommendations may be updated based on patient status, additional functional criteria and insurance authorization.  Follow Up Recommendations  Skilled nursing-short term rehab (<3 hours/day)     Assistance Recommended at Discharge Frequent or constant Supervision/Assistance  Equipment Recommendations  Wheelchair (measurements  PT);Wheelchair cushion (measurements PT)    Recommendations for Other Services       Precautions / Restrictions Precautions Precautions: Fall Precaution Comments: left hemiparesis, appears advancing?     Mobility  Bed Mobility               General bed mobility comments: pt did not participate, reporting  that she needs to have  BM.    Transfers                        Ambulation/Gait                   Stairs             Wheelchair Mobility    Modified Rankin (Stroke Patients Only)       Balance                                            Cognition Arousal/Alertness: Awake/alert Behavior During Therapy: Impulsive;Anxious Overall Cognitive Status: No family/caregiver present to determine baseline cognitive functioning                                 General Comments: patient tangential, perseverating on being costipated and need Peptomismal. Frequent cues for redirection        Exercises      General Comments        Pertinent Vitals/Pain Pain Assessment: Faces Faces Pain Scale: Hurts little more Pain Location: abd cramps  Pain Descriptors / Indicators: Grimacing;Cramping    Home Living Family/patient expects to be discharged to:: Skilled nursing facility                        Prior Function            PT Goals (current goals can now be found in the care plan section) Progress towards PT goals: Not progressing toward goals - comment (appears neurological  defiicts worsened on L extrmities.)    Frequency    Min 2X/week      PT Plan Current plan remains appropriate    Co-evaluation              AM-PAC PT "6 Clicks" Mobility   Outcome Measure  Help needed turning from your back to your side while in a flat bed without using bedrails?: A Little Help needed moving from lying on your back to sitting on the side of a flat bed without using bedrails?: A Little Help  needed moving to and from a bed to a chair (including a wheelchair)?: A Lot Help needed standing up from a chair using your arms (e.g., wheelchair or bedside chair)?: A Lot Help needed to walk in hospital room?: Total Help needed climbing 3-5 steps with a railing? : Total 6 Click Score: 12    End of Session   Activity Tolerance:  (self limiting, focused on need for  BM.) Patient left: in bed;with call bell/phone within reach;with bed alarm set Nurse Communication: Mobility status PT Visit Diagnosis: Difficulty in walking, not elsewhere classified (R26.2);History of falling (Z91.81)     Time: 1455-1510 PT Time Calculation (min) (ACUTE ONLY): 15 min  Charges:  $Neuromuscular Re-education: 8-22 mins                     Tresa Endo PT Acute Rehabilitation Services Pager [phone] Office [phone]    Claretha Cooper 03/17/2021, 3:28 PM

## 2021-03-17 NOTE — Progress Notes (Signed)
Daily Progress Note   Patient Name: Brenda [name]       Date: 03/17/2021 DOB: Jul 02, 1960  Age: 60 y.o. MRN#: [id number] Attending Physician: Alma Friendly, MD Primary Care Physician: Pcp, No Admit Date: 03/11/2021  Reason for Consultation/Follow-up: Establishing goals of care  Subjective: Awake alert, eating lunch, working on her oral intake and ambulation, denies any new symptoms, waiting for radiation to be started tomorrow.    Length of Stay: 6  Current Medications: Scheduled Meds:   carvedilol  6.25 mg Oral BID   Chlorhexidine Gluconate Cloth  6 each Topical Daily   dexamethasone  4 mg Oral Q8H   feeding supplement  237 mL Oral TID BM   ferrous sulfate  325 mg Oral BID WC   gabapentin  300 mg Oral QHS   heparin  5,000 Units Subcutaneous Q8H   multivitamin with minerals  1 tablet Oral Daily   nicotine  14 mg Transdermal Daily   pantoprazole  40 mg Oral BID   polyethylene glycol  17 g Oral BID   senna-docusate  1 tablet Oral BID   thiamine  100 mg Oral Daily    Continuous Infusions:   PRN Meds: acetaminophen **OR** acetaminophen, ondansetron **OR** ondansetron (ZOFRAN) IV, oxyCODONE  Physical Exam         Awake alert resting in bed Regular work of breathing No edema Muscle wasting Generalized weakness: has L sided weakness, has both UE weakness too, patient states this is since her fall and we discussed about her MRI brain findings.   Vital Signs: BP 136/83 (BP Location: Right Arm)    Pulse 82    Temp 97.7 F (36.5 C) (Oral)    Resp 18    Ht 5\' 6"  (1.676 m)    Wt 43.8 kg    LMP 01/19/2011    SpO2 98%    BMI 15.59 kg/m  SpO2: SpO2: 98 % O2 Device: O2 Device: Room Air O2 Flow Rate:    Intake/output summary:  Intake/Output Summary (Last 24 hours) at  03/17/2021 1314 Last data filed at 03/17/2021 0900 Gross per 24 hour  Intake 600 ml  Output 2050 ml  Net -1450 ml    LBM: Last BM Date: 03/16/21 Baseline Weight: Weight: 56.7 kg Most recent weight: Weight: 43.8 kg  Palliative Assessment/Data:      Patient Active Problem List   Diagnosis Date Noted   Protein-calorie malnutrition, severe 03/12/2021   Primary malignant neoplasm of lung with metastasis to brain ([place]) 03/11/2021   Exudative pleural effusion    Sinus tachycardia    Pleural effusion    S/P thoracentesis    Hypomagnesemia    Acute on chronic combined systolic and diastolic congestive heart failure (HCC)    Bacteremia associated with intravascular line ([place]) 07/03/2020   Acute blood loss anemia    Renal failure 07/01/2020   AKI (acute kidney injury) ([place])    Metabolic acidosis    Epistaxis    Seizure-like activity (HCC)    Swelling of lower extremity 05/29/2020   Hypoalbuminemia 05/29/2020   Acute on chronic anemia 04/07/2020   Gastrointestinal hemorrhage 04/07/2020   Acute encephalopathy 04/07/2020   Pancytopenia ([place]) 04/07/2020   Polysubstance abuse ([place]) 04/07/2020   Hypokalemia 10/19/2019   Neutropenia ([place]) 10/05/2019   Malignant neoplasm of right lung ([place]) 09/14/2019   Encounter for antineoplastic chemotherapy 09/14/2019   Encounter for antineoplastic immunotherapy 09/14/2019   Goals of care, counseling/discussion 09/14/2019   Tobacco abuse counseling 09/14/2019   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 ([place])    Alcohol abuse    Acute respiratory failure ([place]) 04/03/2019   Lung mass    Multifocal pneumonia    Major depressive disorder    Major depressive disorder, recurrent episode with mood-congruent psychotic features ([place]) 05/30/2017   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 Earlie Server, underlying history of congestive heart failure tobacco use alcohol use hypoalbuminemia and polysubstance use.  Patient presented with recurrent falls and was found to have MRI of the brain showing large cystic mass as well as multiple lesions.  Recommendations/Plan: Patient has been seen and evaluated by radiation oncology, she is being considered for palliative radiation treatment to the brain with whole brain radiation.  Monitor hospital course and overall disease trajectory of illness, patient wishes to continue with full code status. PMT to follow, will see how patient does with radiation and engage in further goals of care discussions.   Goals of Care and Additional Recommendations: Limitations on Scope of Treatment: Full Scope Treatment  Code Status:    Code Status Orders  (From admission, onward)           Start     Ordered   03/11/21 1721  Full code  Continuous        03/11/21 1722           Code Status History     Date Active Date Inactive Code Status Order ID Comments User Context   08/09/2020 2007 08/17/2020 0041 Full Code [id number]  Elwyn Reach, MD Inpatient   07/01/2020 1433 08/03/2020 0414 Full Code [id number]  Gleason, Sissy Hoff ED   04/07/2020 2042 04/11/2020 1909 Full Code [id number]  Rhetta Mura, DO ED   04/03/2019 1507 04/05/2019 2210 Full Code [id number]  Lyndee Hensen, DO ED   05/30/2017 1558 06/03/2017 0006 Full Code [id number]  Patrecia Pour, NP Inpatient   05/29/2017 1508 05/30/2017 1525 Full Code [id number]  Isla Pence, MD ED   12/24/2011 0140 12/25/2011 2040 Full Code [id number]  Truddie Hidden., MD ED       Prognosis:  Unable to determine  Discharge Planning:  To Be Determined  Care plan was discussed with  patient,   Thank you for allowing the Palliative Medicine Team to assist in the care of this patient.   Time In: 12 Time Out: 12.25 Total Time 25  Prolonged Time Billed  no        Greater than 50%  of this time was spent counseling and coordinating care related to the above assessment and plan.  Loistine Chance, MD  Please contact Palliative Medicine Team phone at [phone] for questions and concerns.

## 2021-03-18 ENCOUNTER — Ambulatory Visit
Admit: 2021-03-18 | Discharge: 2021-03-18 | Disposition: A | Discharge status: Home / Self Care | Payer: Medicaid Other | Attending: Radiation Oncology | Admitting: Radiation Oncology

## 2021-03-18 DIAGNOSIS — F1010 Alcohol abuse, uncomplicated: Secondary | ICD-10-CM | POA: Diagnosis not present

## 2021-03-18 DIAGNOSIS — I5043 Acute on chronic combined systolic (congestive) and diastolic (congestive) heart failure: Secondary | ICD-10-CM | POA: Diagnosis not present

## 2021-03-18 DIAGNOSIS — Z515 Encounter for palliative care: Secondary | ICD-10-CM | POA: Diagnosis not present

## 2021-03-18 DIAGNOSIS — C7931 Secondary malignant neoplasm of brain: Secondary | ICD-10-CM | POA: Diagnosis not present

## 2021-03-18 DIAGNOSIS — C3490 Malignant neoplasm of unspecified part of unspecified bronchus or lung: Secondary | ICD-10-CM | POA: Diagnosis not present

## 2021-03-18 DIAGNOSIS — C3491 Malignant neoplasm of unspecified part of right bronchus or lung: Secondary | ICD-10-CM | POA: Diagnosis not present

## 2021-03-18 LAB — CBC WITH DIFFERENTIAL/PLATELET
Abs Immature Granulocytes: 0.19 10*3/uL — ABNORMAL HIGH (ref 0.00–0.07)
Basophils Absolute: 0 10*3/uL (ref 0.0–0.1)
Basophils Relative: 0 %
Eosinophils Absolute: 0 10*3/uL (ref 0.0–0.5)
Eosinophils Relative: 0 %
HCT: 31.8 % — ABNORMAL LOW (ref 36.0–46.0)
Hemoglobin: 10.2 g/dL — ABNORMAL LOW (ref 12.0–15.0)
Immature Granulocytes: 3 %
Lymphocytes Relative: 25 %
Lymphs Abs: 1.8 10*3/uL (ref 0.7–4.0)
MCH: 33.8 pg (ref 26.0–34.0)
MCHC: 32.1 g/dL (ref 30.0–36.0)
MCV: 105.3 fL — ABNORMAL HIGH (ref 80.0–100.0)
Monocytes Absolute: 0.7 10*3/uL (ref 0.1–1.0)
Monocytes Relative: 10 %
Neutro Abs: 4.4 10*3/uL (ref 1.7–7.7)
Neutrophils Relative %: 62 %
Platelets: 244 10*3/uL (ref 150–400)
RBC: 3.02 MIL/uL — ABNORMAL LOW (ref 3.87–5.11)
RDW: 15.9 % — ABNORMAL HIGH (ref 11.5–15.5)
WBC: 7.1 10*3/uL (ref 4.0–10.5)
nRBC: 0 % (ref 0.0–0.2)

## 2021-03-18 LAB — COMPREHENSIVE METABOLIC PANEL
ALT: 59 U/L — ABNORMAL HIGH (ref 0–44)
AST: 37 U/L (ref 15–41)
Albumin: 2.4 g/dL — ABNORMAL LOW (ref 3.5–5.0)
Alkaline Phosphatase: 125 U/L (ref 38–126)
Anion gap: 7 (ref 5–15)
BUN: 75 mg/dL — ABNORMAL HIGH (ref 6–20)
CO2: 26 mmol/L (ref 22–32)
Calcium: 8.8 mg/dL — ABNORMAL LOW (ref 8.9–10.3)
Chloride: 101 mmol/L (ref 98–111)
Creatinine, Ser: 2.47 mg/dL — ABNORMAL HIGH (ref 0.44–1.00)
GFR, Estimated: 22 mL/min — ABNORMAL LOW (ref >60)
Glucose, Bld: 104 mg/dL — ABNORMAL HIGH (ref 70–99)
Potassium: 4.8 mmol/L (ref 3.5–5.1)
Sodium: 134 mmol/L — ABNORMAL LOW (ref 135–145)
Total Bilirubin: 0.3 mg/dL (ref 0.3–1.2)
Total Protein: 6.4 g/dL — ABNORMAL LOW (ref 6.5–8.1)

## 2021-03-18 NOTE — Progress Notes (Signed)
PROGRESS NOTE    Brenda [name]  NLG:[id number] DOB: September 30, 1960 DOA: 03/11/2021 PCP: Pcp, No   Brief Narrative: Brenda [name] is a 60 y.o. female with PMH significant for stage IV lung CA, used to follow-up with Dr. Julien Nordmann, CHF, tobacco abuse, EtOH abuse, hypoalbuminemia, polysubstance abuse. Patient presented secondary to recurrent falls likely secondary to known metastatic lung cancer with brain metastasis. Decadron started. PT ordered. Palliative care consulted.   Assessment & Plan:   Principal Problem:   Primary malignant neoplasm of lung with metastasis to brain [facility]) Active Problems:   Major depressive disorder, recurrent episode with mood-congruent psychotic features ([place])   Alcohol abuse   Malignant neoplasm of right lung (HCC)   Polysubstance abuse (HCC)   Hypoalbuminemia   Acute on chronic combined systolic and diastolic congestive heart failure (HCC)   Protein-calorie malnutrition, severe   Recurrent falls Associated headache, dizziness, weakness and poor appetite Possibly related to metastatic cancer diagnosis Started empirically on Decadron PT recommending SNF  Left pleural effusion In setting of known stage IV lung cancer. 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 Discussed with oncology who has no recommendations for medical treatment of cancer at this time Neurosurgery evaluated imaging offered surgical management if patient desires, pt declined Patient is interested in radiation therapy for palliation, Rad onc consulted, plan to start radiation on 03/18/21  Difficult placement: May need to stay inpatient to finish radiation treatment depending on how long, as pt refusing SNF, lives with boyfriend in a motel and doesn't want to go back there).  She wants to stay with her niece, but niece is not able to accommodate her.  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 Negative viral load Repeat PCR non-reactive. Possibly error or false positive.  CKD stage IIIb Stable Baseline creatinine of about 2 Daily BMP  Anemia of chronic kidney disease/malignancy Hemoglobin stable around baseline Daily CBC  Hyperkalemia Received 1 dose of Lokelma on 03/16/21 Daily BMP  Alcohol abuse Continue folic acid, thiamine  Polysubstance abuse History of cocaine use  Underweight Severe malnutrition Body mass index is 15.59 kg/m. Dietician conssulted     DVT prophylaxis: Heparin  Code Status:   Code Status: Full Code Family Communication: None at bedside Disposition Plan: SNF    Consultants:  Medical oncology Rad Onc Palliative care  Procedures:  Brain radiation  Antimicrobials: None    Subjective: Patient denies any new complaints    Objective: Vitals:   03/17/21 0428 03/17/21 1335 03/17/21 2055 03/18/21 0553  BP: 136/83 130/85 (!) 144/85 125/78  Pulse: 82 88 89 74  Resp: 18 16 16 16   Temp: 97.7 F (36.5 C) 98.1 F (36.7 C) 98.3 F (36.8 C) 98.1 F (36.7 C)  TempSrc: Oral Oral Oral Oral  SpO2: 98% 99% 98% 99%  Weight:      Height:        Intake/Output Summary (Last 24 hours) at 03/18/2021 1456 Last data filed at 03/17/2021 2230 Gross per 24 hour  Intake 480 ml  Output 800 ml  Net -320 ml   Filed Weights   03/11/21 1432 03/12/21 0430  Weight: 56.7  kg 43.8 kg    Examination: General: NAD, chronically ill appearing  Cardiovascular: S1, S2 present Respiratory: CTAB Abdomen: Soft, nontender, nondistended, bowel sounds present Musculoskeletal: No bilateral pedal edema noted Skin: Normal Psychiatry:  Normal mood      Data Reviewed: I have personally reviewed following labs and imaging studies  CBC Lab Results  Component Value Date   WBC 7.1 03/18/2021   RBC 3.02 (L) 03/18/2021   HGB 10.2 (L) 03/18/2021   HCT 31.8 (L) 03/18/2021   MCV 105.3 (H) 03/18/2021   MCH 33.8 03/18/2021   PLT 244 03/18/2021   MCHC 32.1 03/18/2021   RDW 15.9 (H) 03/18/2021   LYMPHSABS 1.8 03/18/2021   MONOABS 0.7 03/18/2021   EOSABS 0.0 03/18/2021   BASOSABS 0.0 93/81/8299     Last metabolic panel Lab Results  Component Value Date   NA 134 (L) 03/18/2021   K 4.8 03/18/2021   CL 101 03/18/2021   CO2 26 03/18/2021   BUN 75 (H) 03/18/2021   CREATININE 2.47 (H) 03/18/2021   GLUCOSE 104 (H) 03/18/2021   GFRNONAA 22 (L) 03/18/2021   GFRAA  07/01/2020    QUESTIONABLE RESULTS, RECOMMEND RECOLLECT TO VERIFY   CALCIUM 8.8 (L) 03/18/2021   PHOS 3.9 03/12/2021   PROT 6.4 (L) 03/18/2021   ALBUMIN 2.4 (L) 03/18/2021   BILITOT 0.3 03/18/2021   ALKPHOS 125 03/18/2021   AST 37 03/18/2021   ALT 59 (H) 03/18/2021   ANIONGAP 7 03/18/2021    CBG (last 3)  No results for input(s): GLUCAP in the last 72 hours.   GFR: Estimated Creatinine Clearance: 17.2 mL/min (A) (by C-G formula based on SCr of 2.47 mg/dL (H)).  Coagulation Profile: No results for input(s): INR, PROTIME in the last 168 hours.  Recent Results (from the past 240 hour(s))  Resp Panel by RT-PCR (Flu A&B, Covid) Nasopharyngeal Swab     Status: None   Collection Time: 03/11/21  3:17 PM   Specimen: Nasopharyngeal Swab; Nasopharyngeal(NP) swabs in vial transport medium  Result Value Ref Range Status   SARS Coronavirus 2 by RT PCR NEGATIVE NEGATIVE Final    Comment: (NOTE) SARS-CoV-2 target nucleic acids are NOT DETECTED.  The SARS-CoV-2 RNA is generally detectable in upper respiratory specimens during the acute phase of infection. The lowest concentration of SARS-CoV-2 viral copies this assay can detect is 138 copies/mL. A negative  result does not preclude SARS-Cov-2 infection and should not be used as the sole basis for treatment or other patient management decisions. A negative result may occur with  improper specimen collection/handling, submission of specimen other than nasopharyngeal swab, presence of viral mutation(s) within the areas targeted by this assay, and inadequate number of viral copies(<138 copies/mL). A negative result must be combined with clinical observations, patient history, and epidemiological information. The expected result is Negative.  Fact Sheet for Patients:  EntrepreneurPulse.com.au  Fact Sheet for Healthcare Providers:  IncredibleEmployment.be  This test is no t yet approved or cleared by the [place] FDA and  has been authorized for detection and/or diagnosis of SARS-CoV-2 by FDA under an Emergency Use Authorization (EUA). This EUA will remain  in effect (meaning this test can be used) for the duration of the COVID-19 declaration under Section 564(b)(1) of the Act, 21 U.S.C.section 360bbb-3(b)(1), unless the authorization is terminated  or revoked sooner.       Influenza A by PCR NEGATIVE NEGATIVE Final   Influenza B by PCR NEGATIVE NEGATIVE Final    Comment: (NOTE) The  Xpert Xpress SARS-CoV-2/FLU/RSV plus assay is intended as an aid in the diagnosis of influenza from Nasopharyngeal swab specimens and should not be used as a sole basis for treatment. Nasal washings and aspirates are unacceptable for Xpert Xpress SARS-CoV-2/FLU/RSV testing.  Fact Sheet for Patients: EntrepreneurPulse.com.au  Fact Sheet for Healthcare Providers: IncredibleEmployment.be  This test is not yet approved or cleared by the [place] FDA and has been authorized for detection and/or diagnosis of SARS-CoV-2 by FDA under an Emergency Use Authorization (EUA). This EUA will remain in effect (meaning this test can be used)  for the duration of the COVID-19 declaration under Section 564(b)(1) of the Act, 21 U.S.C. section 360bbb-3(b)(1), unless the authorization is terminated or revoked.  Performed at [facility], [place] [address], [place], [place] [address]         Radiology Studies: No results found.      Scheduled Meds:  carvedilol  6.25 mg Oral BID   Chlorhexidine Gluconate Cloth  6 each Topical Daily   dexamethasone  4 mg Oral Q8H   feeding supplement  237 mL Oral TID BM   ferrous sulfate  325 mg Oral BID WC   gabapentin  300 mg Oral QHS   heparin  5,000 Units Subcutaneous Q8H   multivitamin with minerals  1 tablet Oral Daily   nicotine  14 mg Transdermal Daily   pantoprazole  40 mg Oral BID   polyethylene glycol  17 g Oral BID   senna-docusate  1 tablet Oral BID   thiamine  100 mg Oral Daily   Continuous Infusions:     LOS: 7 days     Alma Friendly, MD [facility] 03/18/2021, 2:56 PM  If 7PM-7AM, please contact night-coverage [email or web address]

## 2021-03-18 NOTE — Progress Notes (Signed)
Daily Progress Note   Patient Name: Brenda [name]       Date: 03/18/2021 DOB: 1960/06/19  Age: 60 y.o. MRN#: [id number] Attending Physician: Alma Friendly, MD Primary Care Physician: Pcp, No Admit Date: 03/11/2021  Reason for Consultation/Follow-up: Establishing goals of care  Subjective: I saw Brenda [name] today as she was being prepared to go downstairs for initial radiation therapy.  She was also on the phone and was discussing with someone about not having heat and was therefore preoccupied with that conversation and limited engagement in conversation with me.   Mobility remains significantly compromised per bedside care team.  Length of Stay: 7  Current Medications: Scheduled Meds:   carvedilol  6.25 mg Oral BID   Chlorhexidine Gluconate Cloth  6 each Topical Daily   dexamethasone  4 mg Oral Q8H   feeding supplement  237 mL Oral TID BM   ferrous sulfate  325 mg Oral BID WC   gabapentin  300 mg Oral QHS   heparin  5,000 Units Subcutaneous Q8H   multivitamin with minerals  1 tablet Oral Daily   nicotine  14 mg Transdermal Daily   pantoprazole  40 mg Oral BID   polyethylene glycol  17 g Oral BID   senna-docusate  1 tablet Oral BID   thiamine  100 mg Oral Daily    Continuous Infusions:   PRN Meds: acetaminophen **OR** acetaminophen, ondansetron **OR** ondansetron (ZOFRAN) IV, oxyCODONE  Physical Exam         Awake alert resting in bed Regular work of breathing No edema Muscle wasting Generalized weakness: has L sided weakness, has both UE weakness too  Vital Signs: BP 125/78 (BP Location: Right Arm)    Pulse 74    Temp 98.1 F (36.7 C) (Oral)    Resp 16    Ht 5\' 6"  (1.676 m)    Wt 43.8 kg    LMP 01/19/2011    SpO2 99%    BMI 15.59 kg/m  SpO2: SpO2: 99 % O2  Device: O2 Device: Room Air O2 Flow Rate:    Intake/output summary:  Intake/Output Summary (Last 24 hours) at 03/18/2021 1251 Last data filed at 03/17/2021 2230 Gross per 24 hour  Intake 480 ml  Output 800 ml  Net -320 ml    LBM: Last BM Date: 03/18/21  Baseline Weight: Weight: 56.7 kg Most recent weight: Weight: 43.8 kg       Palliative Assessment/Data:      Patient Active Problem List   Diagnosis Date Noted   Protein-calorie malnutrition, severe 03/12/2021   Primary malignant neoplasm of lung with metastasis to brain ([place]) 03/11/2021   Exudative pleural effusion    Sinus tachycardia    Pleural effusion    S/P thoracentesis    Hypomagnesemia    Acute on chronic combined systolic and diastolic congestive heart failure (HCC)    Bacteremia associated with intravascular line ([place]) 07/03/2020   Acute blood loss anemia    Renal failure 07/01/2020   AKI (acute kidney injury) ([place])    Metabolic acidosis    Epistaxis    Seizure-like activity (HCC)    Swelling of lower extremity 05/29/2020   Hypoalbuminemia 05/29/2020   Acute on chronic anemia 04/07/2020   Gastrointestinal hemorrhage 04/07/2020   Acute encephalopathy 04/07/2020   Pancytopenia ([place]) 04/07/2020   Polysubstance abuse ([place]) 04/07/2020   Hypokalemia 10/19/2019   Neutropenia ([place]) 10/05/2019   Malignant neoplasm of right lung ([place]) 09/14/2019   Encounter for antineoplastic chemotherapy 09/14/2019   Encounter for antineoplastic immunotherapy 09/14/2019   Goals of care, counseling/discussion 09/14/2019   Tobacco abuse counseling 09/14/2019   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 ([place])    Alcohol abuse    Acute respiratory failure ([place]) 04/03/2019   Lung mass    Multifocal pneumonia    Major depressive disorder    Major depressive disorder, recurrent episode with mood-congruent psychotic features ([place]) 05/30/2017   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 Earlie Server, underlying history of congestive heart failure tobacco use alcohol use hypoalbuminemia and polysubstance use.  Patient presented with recurrent falls and was found to have MRI of the brain showing large cystic mass as well as multiple lesions.  Recommendations/Plan: Plan to initiate whole brain radiation beginning today. She remains invested in plan for aggressive interventions including full CODE STATUS.  Palliative to continue to follow this admission and progress conversation based upon her clinical course and emotional ability to process the seriousness of her illness. Goals of Care and Additional Recommendations: Limitations on Scope of Treatment: Full Scope Treatment  Code Status:    Code Status Orders  (From admission, onward)           Start     Ordered   03/11/21 1721  Full code  Continuous        03/11/21 1722           Code Status History     Date Active Date Inactive Code Status Order ID Comments User Context   08/09/2020 2007 08/17/2020 0041 Full Code [id number]  Elwyn Reach, MD Inpatient   07/01/2020 1433 08/03/2020 0414 Full Code [id number]  Gleason, Sissy Hoff ED   04/07/2020 2042 04/11/2020 1909 Full Code [id number]  Rhetta Mura, DO ED   04/03/2019 1507 04/05/2019 2210 Full Code [id number]  Lyndee Hensen, DO ED   05/30/2017 1558 06/03/2017 0006 Full Code [id number]  Patrecia Pour, NP Inpatient   05/29/2017 1508 05/30/2017 1525 Full Code [id number]  Isla Pence, MD ED   12/24/2011 0140 12/25/2011 2040 Full Code [id number]  Truddie Hidden., MD ED       Prognosis:  Unable to determine  Discharge  Planning: To Be Determined  Care plan was discussed with  patient,   Thank you for allowing the Palliative Medicine Team to assist in the care of this patient.   Total Time 20 Prolonged Time  Billed  no    Greater than 50%  of this time was spent counseling and coordinating care related to the above assessment and plan.  Micheline Rough, MD  Please contact Palliative Medicine Team phone at [phone] for questions and concerns.

## 2021-03-18 NOTE — Progress Notes (Signed)
Nutrition Follow-up  DOCUMENTATION CODES:   Severe malnutrition in context of chronic illness, Underweight  INTERVENTION:   -Ensure Enlive po TID, each supplement provides 350 kcal and 20 grams of protein   -Multivitamin with minerals daily  NUTRITION DIAGNOSIS:   Severe Malnutrition related to chronic illness, cancer and cancer related treatments as evidenced by percent weight loss, energy intake < or equal to 75% for > or equal to 1 month, mild fat depletion, moderate muscle depletion.  Ongoing.  GOAL:   Patient will meet greater than or equal to 90% of their needs  Progressing.  MONITOR:   PO intake, Supplement acceptance, Labs, Weight trends, I & O's  ASSESSMENT:   60 y.o. female with PMH significant for stage IV lung CA, used to follow-up with Dr. Julien Nordmann, CHF, HIV +,  tobacco abuse, EtOH abuse, hypoalbuminemia, polysubstance abuse presented to the ED with complaints of recurrent falls.  Patient consuming 35-50% of meals. Pt accepting protein supplements. Palliative care following. Radiation treatments starting today.  Weight 12/21: 96 lbs.  Medications: Ferrous sulfate, Multivitamin with minerals daily, Miralax, Senokot, Thiamine  Labs reviewed: Low Na  Diet Order:   Diet Order             DIET SOFT Room service appropriate? Yes; Fluid consistency: Thin  Diet effective now                   EDUCATION NEEDS:   Education needs have been addressed  Skin:  Skin Assessment: Reviewed RN Assessment  Last BM:  12/27 -type 4  Height:   Ht Readings from Last 1 Encounters:  03/12/21 5\' 6"  (1.676 m)    Weight:   Wt Readings from Last 1 Encounters:  03/12/21 43.8 kg    BMI:  Body mass index is 15.59 kg/m.  Estimated Nutritional Needs:   Kcal:  2000-2200  Protein:  90-110g  Fluid:  2.2L/day  Brenda Bibles, MS, RD, LDN Inpatient Clinical Dietitian Contact information available via Amion

## 2021-03-18 NOTE — TOC Progression Note (Signed)
Transition of Care [facility]) - Progression Note    Patient Details  Name: Brenda [name] MRN: [id number] Date of Birth: September 17, 1960  [facility] CM/SW Contact  Joreen Swearingin, Marjie Skiff, RN Phone Number: 03/18/2021, 2:49 PM  Clinical Narrative:    Spoke with pt at length again today. Nursing staff is saying that it takes 2 person assist to get her up.  Pt still declines SNF and states that she plans on living with her niece at [place]. She states she is not going back to live with her boyfriend at the motel because "he drinks beer all day". She states that her son is bringing her wheelchair so she can get around. I asked her if I could call her niece and she gave permission.   Spoke with niece [name] on the phone who states that pt is not coming home with her at dc and she believes that pt should go to SNF. She asks if we can send her to SNF anyway because we should see that she isn't making good decisions. It was explained that pt has not been declared incompetent and therefore has the right to make decisions that we don't agree with. [name] states that she would be no part of helping with discharge planning as she is unable to help pt due to having "3 kids of my own".  Made MD aware of situation. Plan for pt is to initiate whole brain radiation beginning today. Disposition planning will be difficult with pt. TOC will follow along and assist as able.   Expected Discharge Plan: [place] Barriers to Discharge: Continued Medical Work up  Expected Discharge Plan and Services Expected Discharge Plan: [place]   Discharge Planning Services: CM Consult   Living arrangements for the past 2 months: Single Family Home                           HH Arranged: PT, OT [facility] Agency: [place] Date [facility] Agency Contacted: 03/14/21 Time [place]: [id number] Representative spoke with at [place]: [place] ([place])  Interventions    Readmission Risk Interventions Readmission Risk Prevention Plan 03/14/2021 07/17/2020 04/11/2020  Transportation Screening Complete Complete Complete  PCP or Specialist Appt within 3-5 Days - - Complete  HRI or [place] - Complete Complete  Social Work Consult for [place] Planning/Counseling - Complete Complete  Palliative Care Screening - Not Applicable Not Applicable  Medication Review Press photographer) Complete Complete Complete  PCP or Specialist appointment within 3-5 days of discharge Complete - -  [place] or Home Care Consult Complete - -  SW Recovery Care/Counseling Consult Complete - -  Palliative Care Screening Complete - -  [place] Patient Refused - -  Some recent data might be hidden

## 2021-03-19 ENCOUNTER — Ambulatory Visit
Admit: 2021-03-19 | Discharge: 2021-03-19 | Disposition: A | Discharge status: Home / Self Care | Payer: Medicaid Other | Attending: Radiation Oncology | Admitting: Radiation Oncology

## 2021-03-19 DIAGNOSIS — C3490 Malignant neoplasm of unspecified part of unspecified bronchus or lung: Secondary | ICD-10-CM | POA: Diagnosis not present

## 2021-03-19 DIAGNOSIS — Z7189 Other specified counseling: Secondary | ICD-10-CM

## 2021-03-19 DIAGNOSIS — C3491 Malignant neoplasm of unspecified part of right bronchus or lung: Secondary | ICD-10-CM | POA: Diagnosis not present

## 2021-03-19 DIAGNOSIS — C7931 Secondary malignant neoplasm of brain: Secondary | ICD-10-CM | POA: Diagnosis not present

## 2021-03-19 DIAGNOSIS — I5043 Acute on chronic combined systolic (congestive) and diastolic (congestive) heart failure: Secondary | ICD-10-CM | POA: Diagnosis not present

## 2021-03-19 LAB — COMPREHENSIVE METABOLIC PANEL
ALT: 54 U/L — ABNORMAL HIGH (ref 0–44)
AST: 31 U/L (ref 15–41)
Albumin: 2.4 g/dL — ABNORMAL LOW (ref 3.5–5.0)
Alkaline Phosphatase: 108 U/L (ref 38–126)
Anion gap: 4 — ABNORMAL LOW (ref 5–15)
BUN: 77 mg/dL — ABNORMAL HIGH (ref 6–20)
CO2: 26 mmol/L (ref 22–32)
Calcium: 8.4 mg/dL — ABNORMAL LOW (ref 8.9–10.3)
Chloride: 104 mmol/L (ref 98–111)
Creatinine, Ser: 2.52 mg/dL — ABNORMAL HIGH (ref 0.44–1.00)
GFR, Estimated: 22 mL/min — ABNORMAL LOW (ref >60)
Glucose, Bld: 111 mg/dL — ABNORMAL HIGH (ref 70–99)
Potassium: 5.1 mmol/L (ref 3.5–5.1)
Sodium: 134 mmol/L — ABNORMAL LOW (ref 135–145)
Total Bilirubin: 0.5 mg/dL (ref 0.3–1.2)
Total Protein: 6.2 g/dL — ABNORMAL LOW (ref 6.5–8.1)

## 2021-03-19 LAB — CBC WITH DIFFERENTIAL/PLATELET
Abs Immature Granulocytes: 0.13 10*3/uL — ABNORMAL HIGH (ref 0.00–0.07)
Basophils Absolute: 0 10*3/uL (ref 0.0–0.1)
Basophils Relative: 0 %
Eosinophils Absolute: 0 10*3/uL (ref 0.0–0.5)
Eosinophils Relative: 0 %
HCT: 30.6 % — ABNORMAL LOW (ref 36.0–46.0)
Hemoglobin: 9.8 g/dL — ABNORMAL LOW (ref 12.0–15.0)
Immature Granulocytes: 2 %
Lymphocytes Relative: 25 %
Lymphs Abs: 1.5 10*3/uL (ref 0.7–4.0)
MCH: 34.3 pg — ABNORMAL HIGH (ref 26.0–34.0)
MCHC: 32 g/dL (ref 30.0–36.0)
MCV: 107 fL — ABNORMAL HIGH (ref 80.0–100.0)
Monocytes Absolute: 0.6 10*3/uL (ref 0.1–1.0)
Monocytes Relative: 10 %
Neutro Abs: 3.8 10*3/uL (ref 1.7–7.7)
Neutrophils Relative %: 63 %
Platelets: 259 10*3/uL (ref 150–400)
RBC: 2.86 MIL/uL — ABNORMAL LOW (ref 3.87–5.11)
RDW: 16.3 % — ABNORMAL HIGH (ref 11.5–15.5)
WBC: 6 10*3/uL (ref 4.0–10.5)
nRBC: 0 % (ref 0.0–0.2)

## 2021-03-19 NOTE — Progress Notes (Signed)
Pt with uneventful night, no acute distress noted this shift. Medicated for pain this shift. Lab drawn this am per MD order and per policy. Will continue to monitor pt closely. Alfredo Batty

## 2021-03-19 NOTE — Progress Notes (Signed)
Occupational Therapy Treatment Patient Details Name: Brenda [name] MRN: [id number] DOB: May 17, 1960 Today's Date: 03/19/2021   History of present illness 60 years old female adm with AMS/confusion and falls. 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  Large cystic mass in the right occipital parietal lobe with prior  hemorrhage and local mass-effect. Multiple additional metastatic  lesions as above".  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 Pt refused recommended SNF level rehab following last hospital admit.   OT comments  Patient and nurse tech were educated on importance of keeping LUE on pillow and in good positioning with L side neglect. NT verbalized and demonstrated understanding. Focus of session was on HEP for BUE to maintain ROM of LUE and strength of RUE. Patient's discharge plan remains appropriate at this time. OT will continue to follow acutely.     Recommendations for follow up therapy are one component of a multi-disciplinary discharge planning process, led by the attending physician.  Recommendations may be updated based on patient status, additional functional criteria and insurance authorization.    Follow Up Recommendations  Skilled nursing-short term rehab (<3 hours/day)    Assistance Recommended at Discharge Frequent or constant Supervision/Assistance  Equipment Recommendations  Tub/shower bench    Recommendations for Other Services      Precautions / Restrictions Precautions Precautions: Fall Precaution Comments: left hemiparesis Restrictions Weight Bearing Restrictions: No       Mobility Bed Mobility                    Transfers                         Balance                                           ADL either performed or assessed with clinical judgement   ADL  Overall ADL's : Needs assistance/impaired Eating/Feeding: Set up;Bed level Eating/Feeding Details (indicate cue type and reason): patient was encouraged to use bilateral hand coordination to take sips of ginger ale sitting in bed on this date. patient needed  min guard to keep LUE on glass with movement with patient continuing to not be fully aware of using arm. patient reported today that it feels like her brain does not know its there.                                   General ADL Comments: patient and NT were educated on imporance to keep LUE up on pillow and in good position to prevent contrature risks. NT verbalized and demonstrated understanding.    Extremity/Trunk Assessment Upper Extremity Assessment LUE Deficits / Details: patient was noted to be able to squeeze hand with all digits on this date. patient remains unaware of LUE with needed AAROM to move UE. see misc exercises.            Vision       Quarry manager  Exercises Other Exercises Other Exercises: patient participated in AAROM of LUE with fist squeezes, supination/pronation of forearm, elbow flexion and extension, and shoudler flexion reps with 10 reps x 2 sets Other Exercises: patient was provided with blue stress ball with patient noted to have better coordination of digits for squeezing on therpist hand than stress ball. patient noted to have difficult time holding it,./ Other Exercises: patient participated in RUE exercises with yellow theraband on all three planes with 10 reps each to maintain strength on R side.   Shoulder Instructions       General Comments      Pertinent Vitals/ Pain       Pain Assessment: No/denies pain  Home Living                                          Prior Functioning/Environment              Frequency  Min 2X/week        Progress Toward  Goals  OT Goals(current goals can now be found in the care plan section)        Plan Discharge plan remains appropriate    Co-evaluation                 AM-PAC OT "6 Clicks" Daily Activity     Outcome Measure   Help from another person eating meals?: A Little Help from another person taking care of personal grooming?: A Little Help from another person toileting, which includes using toliet, bedpan, or urinal?: A Little Help from another person bathing (including washing, rinsing, drying)?: A Little Help from another person to put on and taking off regular upper body clothing?: A Little Help from another person to put on and taking off regular lower body clothing?: A Lot 6 Click Score: 17    End of Session    OT Visit Diagnosis: Unsteadiness on feet (R26.81);Hemiplegia and hemiparesis;History of falling (Z91.81);Repeated falls (R29.6);Muscle weakness (generalized) (M62.81);Other symptoms and signs involving cognitive function;Apraxia (R48.2);Other symptoms and signs involving the nervous system (R29.898) Hemiplegia - Right/Left: Left Hemiplegia - caused by: Unspecified   Activity Tolerance Patient tolerated treatment well   Patient Left in bed;with call bell/phone within reach;with bed alarm set   Nurse Communication Other (comment) (nurse cleared patient to participate)        Time: 5038-8828 OT Time Calculation (min): 24 min  Charges: OT General Charges $OT Visit: 1 Visit OT Treatments $Therapeutic Activity: 23-37 mins  Jackelyn Poling OTR/L, MS Acute Rehabilitation Department Office# [phone] Pager# [phone]    Marcellina Millin 03/19/2021, 3:58 PM

## 2021-03-19 NOTE — Progress Notes (Signed)
PROGRESS NOTE    Brenda [name]  SWF:[id number] DOB: 18-Dec-1960 DOA: 03/11/2021 PCP: Merryl Hacker, No   Chief Complaint  Patient presents with   Fall  Brief Narrative/Hospital Course: Brenda [name], 60 y.o. female with PMH of for stage IV lung CA, used to follow-up with Dr. Julien Nordmann, CHF, tobacco abuse, EtOH abuse, hypoalbuminemia, polysubstance abuse. Patient presented secondary to recurrent falls likely secondary to known metastatic lung cancer with brain metastasis. Decadron started. PT ordered. Palliative care consulted.   Subjective: Seen and examined this morning resting comfortably has no new complaints.  Assessment & Plan:  Recurrent falls with associated headache dizziness weakness and poor appetite:Deconditioning/debility debility Difficult placement: Likely related to metastatic cancer diagnosis patient on Decadron, continue PT OT, recommending skilled nursing facility.May need to stay inpatient to finish radiation treatment depending on how long, as pt refusing SNF, lives with boyfriend in a motel and doesn't want to go back there).  She wants to stay with her niece, but niece is not able to accommodate her. 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 Patient is interested in radiation therapy for palliation, Rad onc consulted-started XRT12/27/22.  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 Status is: Inpatient  Remains inpatient appropriate because: Ongoing need for radiation therapy and unsafe disposition need for skilled nursing facility Disposition: Currently NOT medically stable for discharge. Anticipated Disposition: snf, DTP   Objective: Vitals last 24 hrs: Vitals:   03/18/21 0553 03/18/21 2011 03/19/21 0445 03/19/21 0954  BP: 125/78 137/81 131/80 110/75  Pulse: 74 92 74 79  Resp: 16 18 16    Temp: 98.1 F (36.7 C) 98.1 F (36.7 C) 97.8 F (36.6 C)   TempSrc: Oral Oral Oral   SpO2: 99% 100% 98%   Weight:      Height:       Weight change:   Intake/Output Summary (Last 24 hours) at 03/19/2021 1156 Last data filed at 03/19/2021 0948 Gross per 24 hour  Intake 120 ml  Output 700 ml  Net -580 ml   Net IO Since Admission: -1,107.66 mL [03/19/21 1156]   Physical Examination: General exam: AA0x3, weak,older than stated age. HEENT:Oral mucosa moist, Ear/Nose WNL grossly,dentition normal. Respiratory system: B/l diminished BS, no use of accessory muscle, non tender. Cardiovascular system: S1 & S2 +,No JVD. Gastrointestinal system: Abdomen soft, NT,ND, BS+. Nervous System:Alert, awake, moving extremities. Extremities: edema none, distal peripheral pulses palpable.  Skin: No rashes, no icterus. MSK: Normal muscle bulk, tone, power.  Medications reviewed:  Scheduled Meds:  carvedilol  6.25 mg Oral BID   Chlorhexidine Gluconate Cloth  6 each Topical Daily   dexamethasone  4 mg Oral Q8H   feeding supplement  237 mL Oral  TID BM   ferrous sulfate  325 mg Oral BID WC   gabapentin  300 mg Oral QHS   heparin  5,000 Units Subcutaneous Q8H    multivitamin with minerals  1 tablet Oral Daily   nicotine  14 mg Transdermal Daily   pantoprazole  40 mg Oral BID   polyethylene glycol  17 g Oral BID   senna-docusate  1 tablet Oral BID   thiamine  100 mg Oral Daily   Continuous Infusions:  Diet Order             DIET SOFT Room service appropriate? Yes; Fluid consistency: Thin  Diet effective now                   Nutrition Problem: Severe Malnutrition Etiology: chronic illness, cancer and cancer related treatments Signs/Symptoms: percent weight loss, energy intake < or equal to 75% for > or equal to 1 month, mild fat depletion, moderate muscle depletion Interventions: Ensure Enlive (each supplement provides 350kcal and 20 grams of protein), MVI  Weight change:   Wt Readings from Last 3 Encounters:  03/12/21 43.8 kg  08/16/20 56.5 kg  07/31/20 54 kg     Consultants:see note  Procedures:see note Antimicrobials: Anti-infectives (From admission, onward)    None      Culture/Microbiology    Component Value Date/Time   SDES  08/15/2020 1136    Lung, Left Performed at [facility], [place] [address], [place], [place] [address]    SPECREQUEST  08/15/2020 1136    NONE Performed at [facility], [place] [address], [place], [place] [address]    CULT  08/15/2020 1136    NO GROWTH 3 DAYS Performed at [facility] [address], [place], [place] [address]    REPTSTATUS 08/18/2020 FINAL 08/15/2020 1136    Other culture-see note  Unresulted Labs (From admission, onward)     Start     Ordered   03/15/21 0746  CBC with Differential/Platelet  Daily,   R     Question:  Specimen collection method  Answer:  Lab=Lab collect   03/15/21 0745          Data Reviewed: I have personally reviewed following labs and imaging studies CBC: Recent Labs  Lab 03/15/21 0801 03/16/21 0528 03/17/21 0603 03/18/21 0616 03/19/21 0555  WBC 6.2 8.3 6.2 7.1 6.0  NEUTROABS 3.9 5.5 4.1  4.4 3.8  HGB 11.3* 10.3* 10.1* 10.2* 9.8*  HCT 34.0* 32.3* 31.4* 31.8* 30.6*  MCV 103.0* 105.2* 106.4* 105.3* 107.0*  PLT 250 252 243 244 622   Basic Metabolic Panel: Recent Labs  Lab 03/15/21 0801 03/16/21 0528 03/17/21 0603 03/18/21 0616 03/19/21 0555  NA 134* 135 136 134* 134*  K 4.0 5.3* 4.9 4.8 5.1  CL 104 105 105 101 104  CO2 23 25 26 26 26   GLUCOSE 121* 114* 110* 104* 111*  BUN 55* 65* 69* 75* 77*  CREATININE 2.25* 2.40* 2.48* 2.47* 2.52*  CALCIUM 8.8* 8.9 8.7* 8.8* 8.4*   GFR: Estimated Creatinine Clearance: 16.8 mL/min (A) (by C-G formula based on SCr of 2.52 mg/dL (H)). Liver Function Tests: Recent Labs  Lab 03/15/21 0801 03/16/21 0528 03/17/21 0603 03/18/21 0616 03/19/21 0555  AST 26 32 54* 37 31  ALT 20 32 61* 59* 54*  ALKPHOS 144* 125 124 125 108  BILITOT 0.3 0.5 0.4 0.3 0.5  PROT 6.7 6.3* 6.1* 6.4* 6.2*  ALBUMIN 2.6* 2.4* 2.4* 2.4* 2.4*  No results for input(s): LIPASE, AMYLASE in the last 168 hours. No results for input(s): AMMONIA in the last 168 hours. Coagulation Profile: No results for input(s): INR, PROTIME in the last 168 hours. Cardiac Enzymes: No results for input(s): CKTOTAL, CKMB, CKMBINDEX, TROPONINI in the last 168 hours. BNP (last 3 results) No results for input(s): PROBNP in the last 8760 hours. HbA1C: No results for input(s): HGBA1C in the last 72 hours. CBG: No results for input(s): GLUCAP in the last 168 hours. Lipid Profile: No results for input(s): CHOL, HDL, LDLCALC, TRIG, CHOLHDL, LDLDIRECT in the last 72 hours. Thyroid Function Tests: No results for input(s): TSH, T4TOTAL, FREET4, T3FREE, THYROIDAB in the last 72 hours. Anemia Panel: No results for input(s): VITAMINB12, FOLATE, FERRITIN, TIBC, IRON, RETICCTPCT in the last 72 hours. Sepsis Labs: No results for input(s): PROCALCITON, LATICACIDVEN in the last 168 hours.  Recent Results (from the past 240 hour(s))  Resp Panel by RT-PCR (Flu A&B, Covid) Nasopharyngeal Swab      Status: None   Collection Time: 03/11/21  3:17 PM   Specimen: Nasopharyngeal Swab; Nasopharyngeal(NP) swabs in vial transport medium  Result Value Ref Range Status   SARS Coronavirus 2 by RT PCR NEGATIVE NEGATIVE Final    Comment: (NOTE) SARS-CoV-2 target nucleic acids are NOT DETECTED.  The SARS-CoV-2 RNA is generally detectable in upper respiratory specimens during the acute phase of infection. The lowest concentration of SARS-CoV-2 viral copies this assay can detect is 138 copies/mL. A negative result does not preclude SARS-Cov-2 infection and should not be used as the sole basis for treatment or other patient management decisions. A negative result may occur with  improper specimen collection/handling, submission of specimen other than nasopharyngeal swab, presence of viral mutation(s) within the areas targeted by this assay, and inadequate number of viral copies(<138 copies/mL). A negative result must be combined with clinical observations, patient history, and epidemiological information. The expected result is Negative.  Fact Sheet for Patients:  EntrepreneurPulse.com.au  Fact Sheet for Healthcare Providers:  IncredibleEmployment.be  This test is no t yet approved or cleared by the [place] FDA and  has been authorized for detection and/or diagnosis of SARS-CoV-2 by FDA under an Emergency Use Authorization (EUA). This EUA will remain  in effect (meaning this test can be used) for the duration of the COVID-19 declaration under Section 564(b)(1) of the Act, 21 U.S.C.section 360bbb-3(b)(1), unless the authorization is terminated  or revoked sooner.       Influenza A by PCR NEGATIVE NEGATIVE Final   Influenza B by PCR NEGATIVE NEGATIVE Final    Comment: (NOTE) The Xpert Xpress SARS-CoV-2/FLU/RSV plus assay is intended as an aid in the diagnosis of influenza from Nasopharyngeal swab specimens and should not be used as a sole  basis for treatment. Nasal washings and aspirates are unacceptable for Xpert Xpress SARS-CoV-2/FLU/RSV testing.  Fact Sheet for Patients: EntrepreneurPulse.com.au  Fact Sheet for Healthcare Providers: IncredibleEmployment.be  This test is not yet approved or cleared by the [place] FDA and has been authorized for detection and/or diagnosis of SARS-CoV-2 by FDA under an Emergency Use Authorization (EUA). This EUA will remain in effect (meaning this test can be used) for the duration of the COVID-19 declaration under Section 564(b)(1) of the Act, 21 U.S.C. section 360bbb-3(b)(1), unless the authorization is terminated or revoked.  Performed at [facility], [place] [address] [place], [place] [address]      Radiology Studies: No results found.   LOS: 8 days  Antonieta Pert, MD [facility]  03/19/2021, 11:56 AM

## 2021-03-20 ENCOUNTER — Ambulatory Visit
Admit: 2021-03-20 | Discharge: 2021-03-20 | Disposition: A | Discharge status: Home / Self Care | Payer: Medicaid Other | Attending: Radiation Oncology | Admitting: Radiation Oncology

## 2021-03-20 DIAGNOSIS — C3490 Malignant neoplasm of unspecified part of unspecified bronchus or lung: Secondary | ICD-10-CM | POA: Diagnosis not present

## 2021-03-20 DIAGNOSIS — C7931 Secondary malignant neoplasm of brain: Secondary | ICD-10-CM | POA: Diagnosis not present

## 2021-03-20 LAB — CBC WITH DIFFERENTIAL/PLATELET
Abs Immature Granulocytes: 0.16 K/uL — ABNORMAL HIGH (ref 0.00–0.07)
Basophils Absolute: 0 K/uL (ref 0.0–0.1)
Basophils Relative: 0 %
Eosinophils Absolute: 0 K/uL (ref 0.0–0.5)
Eosinophils Relative: 0 %
HCT: 29.8 % — ABNORMAL LOW (ref 36.0–46.0)
Hemoglobin: 9.6 g/dL — ABNORMAL LOW (ref 12.0–15.0)
Immature Granulocytes: 3 %
Lymphocytes Relative: 22 %
Lymphs Abs: 1.2 K/uL (ref 0.7–4.0)
MCH: 34.2 pg — ABNORMAL HIGH (ref 26.0–34.0)
MCHC: 32.2 g/dL (ref 30.0–36.0)
MCV: 106 fL — ABNORMAL HIGH (ref 80.0–100.0)
Monocytes Absolute: 0.6 K/uL (ref 0.1–1.0)
Monocytes Relative: 12 %
Neutro Abs: 3.4 K/uL (ref 1.7–7.7)
Neutrophils Relative %: 63 %
Platelets: 253 K/uL (ref 150–400)
RBC: 2.81 MIL/uL — ABNORMAL LOW (ref 3.87–5.11)
RDW: 15.9 % — ABNORMAL HIGH (ref 11.5–15.5)
WBC: 5.3 K/uL (ref 4.0–10.5)
nRBC: 0 % (ref 0.0–0.2)

## 2021-03-20 LAB — BASIC METABOLIC PANEL
Anion gap: 10 (ref 5–15)
BUN: 86 mg/dL — ABNORMAL HIGH (ref 6–20)
CO2: 25 mmol/L (ref 22–32)
Calcium: 8.9 mg/dL (ref 8.9–10.3)
Chloride: 102 mmol/L (ref 98–111)
Creatinine, Ser: 2.65 mg/dL — ABNORMAL HIGH (ref 0.44–1.00)
GFR, Estimated: 20 mL/min — ABNORMAL LOW (ref >60)
Glucose, Bld: 102 mg/dL — ABNORMAL HIGH (ref 70–99)
Potassium: 5.5 mmol/L — ABNORMAL HIGH (ref 3.5–5.1)
Sodium: 137 mmol/L (ref 135–145)

## 2021-03-20 MED ORDER — SODIUM ZIRCONIUM CYCLOSILICATE 10 G PO PACK
10.0000 g | PACK | Freq: Once | ORAL | Status: AC
  Administered 2021-03-20: 12:00:00 10 g via ORAL
  Filled 2021-03-20: qty 1

## 2021-03-20 NOTE — Progress Notes (Addendum)
PT Cancellation Note  Patient Details Name: Brenda [name] MRN: [id number] DOB: 11-28-1960   Cancelled Treatment:    Reason Eval/Treat Not Completed: Other (comment) (attempted PT x 3 this morning. Pt was having a BM, then meeting with chaplain, then refused due to wanting to eat a bag of chips. Benefits of mobility were explained, she continued to refuse PT. She stated, "don't come back". Will follow.)   Philomena Doheny PT 03/20/2021  Acute Rehabilitation Services Pager [phone] Office [phone]

## 2021-03-20 NOTE — Progress Notes (Signed)
Daily Progress Note   Patient Name: Brenda [name]       Date: 03/20/2021 DOB: 02-16-1961  Age: 60 y.o. MRN#: [id number] Attending Physician: Antonieta Pert, MD Primary Care Physician: Pcp, No Admit Date: 03/11/2021  Reason for Consultation/Follow-up: Establishing goals of care  Subjective: I saw and examined Brenda [name] today.  Her niece, Brenda [name], was also present at the bedside for part of the encounter (she was on break and had to leave before I finished talking with Brenda [name]).  [name] was awake and alert and lying in bed in no distress.  Discussed her clinical course and continued weakness with recommendation for skilled facility at time of discharge.  She states that she does not want to go to skilled facility as she would need to sign over her check.  Brenda [name] pointed out that this would be a good use of her check as it would be a safe place for her to stay.  Brenda [name] expressed concerns about her having a safe place to live when she leaves the hospital.  Brenda [name] minimizes concerns and states she will, "figure it out."  Discussed that I will follow-up later this week and also be working in conjunction with TOC to help to determine best options for her care moving forward.  Length of Stay: 9  Current Medications: Scheduled Meds:   carvedilol  6.25 mg Oral BID   Chlorhexidine Gluconate Cloth  6 each Topical Daily   dexamethasone  4 mg Oral Q8H   feeding supplement  237 mL Oral TID BM   ferrous sulfate  325 mg Oral BID WC   gabapentin  300 mg Oral QHS   heparin  5,000 Units Subcutaneous Q8H   multivitamin with minerals  1 tablet Oral Daily   nicotine  14 mg Transdermal Daily   pantoprazole  40 mg Oral BID   polyethylene glycol  17 g Oral BID   senna-docusate  1 tablet Oral BID    sodium zirconium cyclosilicate  10 g Oral Once   thiamine  100 mg Oral Daily    Continuous Infusions:   PRN Meds: acetaminophen **OR** acetaminophen, ondansetron **OR** ondansetron (ZOFRAN) IV, oxyCODONE  Physical Exam         Awake alert resting in bed Regular work of breathing No edema Muscle wasting Generalized weakness: has  L sided weakness, has both UE weakness too  Vital Signs: BP 126/81    Pulse 85    Temp 97.8 F (36.6 C) (Oral)    Resp 18    Ht 5\' 6"  (1.676 m)    Wt 43.8 kg    LMP 01/19/2011    SpO2 98%    BMI 15.59 kg/m  SpO2: SpO2: 98 % O2 Device: O2 Device: Room Air O2 Flow Rate:    Intake/output summary:  Intake/Output Summary (Last 24 hours) at 03/20/2021 0854 Last data filed at 03/20/2021 0601 Gross per 24 hour  Intake 360 ml  Output 600 ml  Net -240 ml    LBM: Last BM Date: 03/19/21 Baseline Weight: Weight: 56.7 kg Most recent weight: Weight: 43.8 kg       Palliative Assessment/Data:      Patient Active Problem List   Diagnosis Date Noted   Protein-calorie malnutrition, severe 03/12/2021   Primary malignant neoplasm of lung with metastasis to brain ([place]) 03/11/2021   Exudative pleural effusion    Sinus tachycardia    Pleural effusion    S/P thoracentesis    Hypomagnesemia    Acute on chronic combined systolic and diastolic congestive heart failure (HCC)    Bacteremia associated with intravascular line ([place]) 07/03/2020   Acute blood loss anemia    Renal failure 07/01/2020   AKI (acute kidney injury) ([place])    Metabolic acidosis    Epistaxis    Seizure-like activity (HCC)    Swelling of lower extremity 05/29/2020   Hypoalbuminemia 05/29/2020   Acute on chronic anemia 04/07/2020   Gastrointestinal hemorrhage 04/07/2020   Acute encephalopathy 04/07/2020   Pancytopenia ([place]) 04/07/2020   Polysubstance abuse ([place]) 04/07/2020   Hypokalemia 10/19/2019   Neutropenia ([place]) 10/05/2019   Malignant neoplasm of right lung ([place]) 09/14/2019    Encounter for antineoplastic chemotherapy 09/14/2019   Encounter for antineoplastic immunotherapy 09/14/2019   Goals of care, counseling/discussion 09/14/2019   Tobacco abuse counseling 09/14/2019   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 ([place])    Alcohol abuse    Acute respiratory failure ([place]) 04/03/2019   Lung mass    Multifocal pneumonia    Major depressive disorder    Major depressive disorder, recurrent episode with mood-congruent psychotic features ([place]) 05/30/2017   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 Earlie Server, underlying history of congestive heart failure tobacco use alcohol use hypoalbuminemia and polysubstance use.  Patient presented with recurrent falls and was found to have MRI of the brain showing large cystic mass as well as multiple lesions.  Recommendations/Plan: Continue radiation She remains invested in plan for aggressive interventions including full CODE STATUS.  Palliative to continue to follow this admission and progress conversation based upon her clinical course and emotional ability to process the seriousness of her illness. Will continue to discuss with TOC.  Safe disposition remains a challenge.  Goals of Care and Additional Recommendations: Limitations on Scope of Treatment: Full Scope Treatment  Code Status:    Code Status Orders  (From admission, onward)           Start     Ordered   03/11/21 1721  Full code  Continuous        03/11/21 1722           Code Status History  Date Active Date Inactive Code Status Order ID Comments User Context   08/09/2020 2007 08/17/2020 0041 Full Code [id number]  Elwyn Reach, MD Inpatient   07/01/2020 1433 08/03/2020 0414 Full Code [id number]  Gleason, Sissy Hoff ED   04/07/2020 2042 04/11/2020 1909 Full Code  [id number]  Rhetta Mura, DO ED   04/03/2019 1507 04/05/2019 2210 Full Code [id number]  Lyndee Hensen, DO ED   05/30/2017 1558 06/03/2017 0006 Full Code [id number]  Patrecia Pour, NP Inpatient   05/29/2017 1508 05/30/2017 1525 Full Code [id number]  Isla Pence, MD ED   12/24/2011 0140 12/25/2011 2040 Full Code [id number]  Truddie Hidden., MD ED       Prognosis:  Unable to determine  Discharge Planning: To Be Determined  Care plan was discussed with  patient,   Thank you for allowing the Palliative Medicine Team to assist in the care of this patient.   Total Time 40 Prolonged Time Billed  no    Greater than 50%  of this time was spent counseling and coordinating care related to the above assessment and plan.   Micheline Rough, MD  Please contact Palliative Medicine Team phone at [phone] for questions and concerns.

## 2021-03-20 NOTE — Progress Notes (Signed)
PROGRESS NOTE    Brenda [name]  PIR:[id number] DOB: May 02, 1960 DOA: 03/11/2021 PCP: Merryl Hacker, No   Chief Complaint  Patient presents with   Fall  Brief Narrative/Hospital Course: Brenda [name], 60 y.o. female with PMH of for stage IV lung CA, used to follow-up with Dr. Julien Nordmann, CHF, tobacco abuse, EtOH abuse, hypoalbuminemia, polysubstance abuse. Patient presented secondary to recurrent falls likely secondary to known metastatic lung cancer with brain metastasis. Decadron started. PT ordered. Palliative care consulted.   Subjective: Seen and examined Patient has no new complaints.  She is on the way to radiation therapy. Overnight afebrile, labs with potassium on higher side 5.5  Assessment & Plan:  Recurrent falls with associated headache dizziness weakness and poor appetite:Deconditioning/debility debility Difficult placement: Suspect her presentation is due to metastatic cancer . Cont on Decadron, supportive care, PT OT who have recommending SNF.May need to stay inpatient to finish radiation treatment depending on how long, as pt refusing SNF, lives with boyfriend in a motel and doesn't want to go back there).  She wants to stay with her niece, but niece is not able to accommodate her. 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 Patient is interested in radiation therapy for palliation, Rad onc consulted-started XRT12/27/22.  Continue Decadron, supportive care  Left pleural effusion in the setting of stage IV lung cancer doing well on room air  Chronic combined systolic and diastolic CHF with EF 63-01% G1 DD.remains euvolemic, continue Coreg.  Monitor weight Filed Weights   03/11/21 1432 03/12/21 0430  Weight: 56.7 kg 43.8 kg     CKD stage IIIb stable  creatinine around 2, monitor. Anemia of chronic disease/malignancy hemoglobin is stable monitor Hyperkalemia  had Lokelma 12/25. potassium again outputted radiology Lokelma change diet to renal diet Recent Labs  Lab 03/16/21 0528 03/17/21 0603 03/18/21 0616 03/19/21 0555 03/20/21 0505  K 5.3* 4.9 4.8 5.1 5.5*    Polysubstance abuse history of cocaine use Alcohol abuse: Cont on folic acid thiamine and alcohol /cocaine cessation counseling  Severe malnutrition BMI 15 augment diet as tolerated.  DVT prophylaxis: heparin injection 5,000 Units Start: 03/11/21 2200 Code Status:   Code Status: Full Code Family Communication: plan of care discussed with patient at bedside. Status is: Inpatient  Remains inpatient appropriate because: Ongoing need for radiation therapy and unsafe disposition need for skilled nursing facility Disposition: Currently NOT medically stable for discharge. Anticipated Disposition: snf, DTP   Objective: Vitals last 24 hrs: Vitals:   03/19/21 0445 03/19/21 0954 03/19/21 1820 03/20/21 0431  BP: 131/80 110/75 (!) 154/92 126/81  Pulse: 74 79 93 85  Resp: 16  18   Temp: 97.8 F (36.6 C)  98.7 F (37.1 C) 97.8 F (36.6 C)  TempSrc: Oral  Oral Oral  SpO2: 98%  98% 98%  Weight:      Height:       Weight change:   Intake/Output Summary (Last 24 hours) at 03/20/2021 1020 Last data filed at 03/20/2021 0928 Gross per 24 hour  Intake 360 ml  Output 600 ml  Net -240 ml   Net IO Since Admission: -1,347.66 mL [03/20/21 1020]   Physical Examination: General exam: AAOx 3, older than stated age, weak appearing. HEENT:Oral mucosa moist, Ear/Nose WNL grossly, dentition normal. Respiratory system: bilaterally clear, no use of accessory muscle Cardiovascular  system: S1 & S2 +, No JVD,. Gastrointestinal system: Abdomen soft, NT,ND, BS+ Nervous System:Alert, awake, moving extremities and grossly nonfocal Extremities: Chronic hyperpigmentation on right foot, distal  peripheral pulses palpable.  Skin: No rashes,no icterus. MSK: Normal muscle bulk,tone, power .  Medications reviewed:  Scheduled Meds:  carvedilol  6.25 mg Oral BID   Chlorhexidine Gluconate Cloth  6 each Topical Daily   dexamethasone  4 mg Oral Q8H   feeding supplement  237 mL Oral TID BM   ferrous sulfate  325 mg Oral BID WC   gabapentin  300 mg Oral QHS   heparin  5,000 Units Subcutaneous Q8H   multivitamin with minerals  1 tablet Oral Daily   nicotine  14 mg Transdermal Daily   pantoprazole  40 mg Oral BID   polyethylene glycol  17 g Oral BID   senna-docusate  1 tablet Oral BID   sodium zirconium cyclosilicate  10 g Oral Once   thiamine  100 mg Oral Daily   Continuous Infusions:  Diet Order             DIET SOFT Room service appropriate? Yes; Fluid consistency: Thin  Diet effective now                   Nutrition Problem: Severe Malnutrition Etiology: chronic illness, cancer and cancer related treatments Signs/Symptoms: percent weight loss, energy intake < or equal to 75% for > or equal to 1 month, mild fat depletion, moderate muscle depletion Interventions: Ensure Enlive (each supplement provides 350kcal and 20 grams of protein), MVI  Weight change:   Wt Readings from Last 3 Encounters:  03/12/21 43.8 kg  08/16/20 56.5 kg  07/31/20 54 kg     Consultants:see note  Procedures:see note Antimicrobials: Anti-infectives (From admission, onward)    None      Culture/Microbiology    Component Value Date/Time   SDES  08/15/2020 1136    Lung, Left Performed at [facility], [place] [address], [place], [place] [address]    SPECREQUEST  08/15/2020 1136    NONE Performed at [facility], [place] [address] [place], [place] [address]    CULT  08/15/2020 1136    NO GROWTH 3 DAYS Performed at [place] [address], [place], [place] [address]    REPTSTATUS 08/18/2020 FINAL 08/15/2020 1136    Other culture-see  note  Unresulted Labs (From admission, onward)     Start     Ordered   03/22/21 0500  CBC with Differential/Platelet  Every 48 hours,   R     Question:  Specimen collection method  Answer:  Lab=Lab collect   03/20/21 0737   03/20/21 2947  Basic metabolic panel  Daily,   R     Question:  Specimen collection method  Answer:  Unit=Unit collect   03/19/21 1746          Data Reviewed: I have personally reviewed following labs and imaging studies CBC: Recent Labs  Lab 03/16/21 0528 03/17/21 0603 03/18/21 0616 03/19/21 0555 03/20/21 0505  WBC 8.3 6.2 7.1 6.0 5.3  NEUTROABS 5.5 4.1 4.4 3.8 3.4  HGB 10.3* 10.1* 10.2* 9.8* 9.6*  HCT 32.3* 31.4* 31.8* 30.6* 29.8*  MCV 105.2* 106.4* 105.3* 107.0* 106.0*  PLT 252 243 244 259 654   Basic Metabolic Panel: Recent Labs  Lab 03/16/21 0528 03/17/21 0603 03/18/21 0616 03/19/21 0555 03/20/21 0505  NA 135 136 134* 134* 137  K 5.3* 4.9  4.8 5.1 5.5*  CL 105 105 101 104 102  CO2 25 26 26 26 25   GLUCOSE 114* 110* 104* 111* 102*  BUN 65* 69* 75* 77* 86*  CREATININE 2.40* 2.48* 2.47* 2.52* 2.65*  CALCIUM 8.9 8.7* 8.8* 8.4* 8.9   GFR: Estimated Creatinine Clearance: 16 mL/min (A) (by C-G formula based on SCr of 2.65 mg/dL (H)). Liver Function Tests: Recent Labs  Lab 03/15/21 0801 03/16/21 0528 03/17/21 0603 03/18/21 0616 03/19/21 0555  AST 26 32 54* 37 31  ALT 20 32 61* 59* 54*  ALKPHOS 144* 125 124 125 108  BILITOT 0.3 0.5 0.4 0.3 0.5  PROT 6.7 6.3* 6.1* 6.4* 6.2*  ALBUMIN 2.6* 2.4* 2.4* 2.4* 2.4*   No results for input(s): LIPASE, AMYLASE in the last 168 hours. No results for input(s): AMMONIA in the last 168 hours. Coagulation Profile: No results for input(s): INR, PROTIME in the last 168 hours. Cardiac Enzymes: No results for input(s): CKTOTAL, CKMB, CKMBINDEX, TROPONINI in the last 168 hours. BNP (last 3 results) No results for input(s): PROBNP in the last 8760 hours. HbA1C: No results for input(s): HGBA1C in the  last 72 hours. CBG: No results for input(s): GLUCAP in the last 168 hours. Lipid Profile: No results for input(s): CHOL, HDL, LDLCALC, TRIG, CHOLHDL, LDLDIRECT in the last 72 hours. Thyroid Function Tests: No results for input(s): TSH, T4TOTAL, FREET4, T3FREE, THYROIDAB in the last 72 hours. Anemia Panel: No results for input(s): VITAMINB12, FOLATE, FERRITIN, TIBC, IRON, RETICCTPCT in the last 72 hours. Sepsis Labs: No results for input(s): PROCALCITON, LATICACIDVEN in the last 168 hours.  Recent Results (from the past 240 hour(s))  Resp Panel by RT-PCR (Flu A&B, Covid) Nasopharyngeal Swab     Status: None   Collection Time: 03/11/21  3:17 PM   Specimen: Nasopharyngeal Swab; Nasopharyngeal(NP) swabs in vial transport medium  Result Value Ref Range Status   SARS Coronavirus 2 by RT PCR NEGATIVE NEGATIVE Final    Comment: (NOTE) SARS-CoV-2 target nucleic acids are NOT DETECTED.  The SARS-CoV-2 RNA is generally detectable in upper respiratory specimens during the acute phase of infection. The lowest concentration of SARS-CoV-2 viral copies this assay can detect is 138 copies/mL. A negative result does not preclude SARS-Cov-2 infection and should not be used as the sole basis for treatment or other patient management decisions. A negative result may occur with  improper specimen collection/handling, submission of specimen other than nasopharyngeal swab, presence of viral mutation(s) within the areas targeted by this assay, and inadequate number of viral copies(<138 copies/mL). A negative result must be combined with clinical observations, patient history, and epidemiological information. The expected result is Negative.  Fact Sheet for Patients:  EntrepreneurPulse.com.au  Fact Sheet for Healthcare Providers:  IncredibleEmployment.be  This test is no t yet approved or cleared by the [place] FDA and  has been authorized for detection and/or  diagnosis of SARS-CoV-2 by FDA under an Emergency Use Authorization (EUA). This EUA will remain  in effect (meaning this test can be used) for the duration of the COVID-19 declaration under Section 564(b)(1) of the Act, 21 U.S.C.section 360bbb-3(b)(1), unless the authorization is terminated  or revoked sooner.       Influenza A by PCR NEGATIVE NEGATIVE Final   Influenza B by PCR NEGATIVE NEGATIVE Final    Comment: (NOTE) The Xpert Xpress SARS-CoV-2/FLU/RSV plus assay is intended as an aid in the diagnosis of influenza from Nasopharyngeal swab specimens and should not be used as a sole basis  for treatment. Nasal washings and aspirates are unacceptable for Xpert Xpress SARS-CoV-2/FLU/RSV testing.  Fact Sheet for Patients: EntrepreneurPulse.com.au  Fact Sheet for Healthcare Providers: IncredibleEmployment.be  This test is not yet approved or cleared by the [place] FDA and has been authorized for detection and/or diagnosis of SARS-CoV-2 by FDA under an Emergency Use Authorization (EUA). This EUA will remain in effect (meaning this test can be used) for the duration of the COVID-19 declaration under Section 564(b)(1) of the Act, 21 U.S.C. section 360bbb-3(b)(1), unless the authorization is terminated or revoked.  Performed at [facility], [place] [address] [place], [place] [address]      Radiology Studies: No results found.   LOS: 9 days   Antonieta Pert, MD [facility]  03/20/2021, 10:20 AM

## 2021-03-20 NOTE — Progress Notes (Signed)
Chaplain received referral from Palliative Team to provide support for [facility].  She is having a difficult time talking with her son about coming to the end of her life.  He is very emotional and then that makes it very hard on her.  She stated that in some ways she wants to just "get it over with" but she also does not want that.  She shared about family and engaged in some life review.  Chaplain provided ministry of listening as well as prayer, at [name]'s request.  She was visibly calmed by the prayer and was deeply appreciative.  Chaplain assisted Brenda [name] in ordering lunch and bringing her something to drink following her radiation.  [place], [place] Pager, [phone] 11:53 AM

## 2021-03-21 ENCOUNTER — Ambulatory Visit
Admit: 2021-03-21 | Discharge: 2021-03-21 | Disposition: A | Discharge status: Home / Self Care | Payer: Medicaid Other | Attending: Radiation Oncology | Admitting: Radiation Oncology

## 2021-03-21 DIAGNOSIS — C7931 Secondary malignant neoplasm of brain: Secondary | ICD-10-CM | POA: Diagnosis not present

## 2021-03-21 DIAGNOSIS — C3490 Malignant neoplasm of unspecified part of unspecified bronchus or lung: Secondary | ICD-10-CM | POA: Diagnosis not present

## 2021-03-21 LAB — BASIC METABOLIC PANEL
Anion gap: 5 (ref 5–15)
BUN: 84 mg/dL — ABNORMAL HIGH (ref 6–20)
CO2: 27 mmol/L (ref 22–32)
Calcium: 8.8 mg/dL — ABNORMAL LOW (ref 8.9–10.3)
Chloride: 102 mmol/L (ref 98–111)
Creatinine, Ser: 2.58 mg/dL — ABNORMAL HIGH (ref 0.44–1.00)
GFR, Estimated: 21 mL/min — ABNORMAL LOW (ref >60)
Glucose, Bld: 126 mg/dL — ABNORMAL HIGH (ref 70–99)
Potassium: 5.3 mmol/L — ABNORMAL HIGH (ref 3.5–5.1)
Sodium: 134 mmol/L — ABNORMAL LOW (ref 135–145)

## 2021-03-21 MED ORDER — SODIUM ZIRCONIUM CYCLOSILICATE 5 G PO PACK
5.0000 g | PACK | Freq: Every day | ORAL | Status: AC
  Administered 2021-03-21 – 2021-03-23 (×3): 5 g via ORAL
  Filled 2021-03-21 (×3): qty 1

## 2021-03-21 NOTE — Progress Notes (Signed)
PROGRESS NOTE    Brenda [name]  VVO:[id number] DOB: 1960/08/07 DOA: 03/11/2021 PCP: Merryl Hacker, No   Chief Complaint  Patient presents with   Fall  Brief Narrative/Hospital Course: Brenda [name], 60 y.o. female with PMH of for stage IV lung CA, used to follow-up with Dr. Julien Nordmann, CHF, tobacco abuse, EtOH abuse, hypoalbuminemia, polysubstance abuse. Patient presented secondary to recurrent falls likely secondary to known metastatic lung cancer with brain metastasis. Decadron started. PT ordered. Palliative care consulted. Patient is being managed with steroids and radiation therapy and followed by palliative care   Subjective: Seen and examined this morning no new complaints.  Resting comfortably.  Assessment & Plan:  Recurrent falls with associated headache dizziness weakness and poor appetite:Deconditioning/debility debility Difficult placement: Suspect her presentation is due to metastatic cancer . Cont on Decadron, supportive care, PT OT who have recommending SNF.May need to stay inpatient to finish radiation treatment depending on how long, as pt refusing SNF, lives with boyfriend in a motel and doesn't want to go back there).  She wants to stay with her niece, but niece is not able to accommodate her. 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 Patient is interested in radiation therapy for palliation, Rad onc consulted-started XRT12/27/22.  Continue Decadron, supportive care.  Continue pain control  Left pleural effusion in the setting of stage IV lung cancer doing well on room air  Chronic combined systolic and diastolic CHF with EF 26-94% G1 DD.remains euvolemic. Continue Coreg.  Monitor weight Filed Weights   03/11/21 1432 03/12/21 0430  Weight: 56.7 kg  43.8 kg     CKD stage IIIb renal function at baseline in twos. Anemia of chronic disease/malignancy hemoglobin is stable monitor Hyperkalemia  had Lokelma 12/25.  Potassium slightly up 5.3 monitor, add Lokelma 5 daily, cont  renal diet Recent Labs  Lab 03/17/21 0603 03/18/21 0616 03/19/21 0555 03/20/21 0505 03/21/21 0517  K 4.9 4.8 5.1 5.5* 5.3*     Polysubstance abuse history of cocaine use Alcohol abuse: Cont on folic acid thiamine and alcohol /cocaine cessation counseling  Severe malnutrition BMI 15 augment diet as tolerated.  DVT prophylaxis: heparin injection 5,000 Units Start: 03/11/21 2200 Code Status:   Code Status: Full Code Family Communication: plan of care discussed with patient at bedside. Status is: Inpatient  Remains inpatient appropriate because: Ongoing need for radiation therapy and unsafe disposition need for skilled nursing facility Disposition: Currently NOT medically stable for discharge. Anticipated Disposition: snf, DTP   Objective: Vitals last 24 hrs: Vitals:   03/20/21 0431 03/20/21 1222 03/20/21 2038 03/21/21 0423  BP: 126/81 (!) 143/97 (!) 158/97 138/83  Pulse: 85 76 93 76  Resp:  18 18 18   Temp: 97.8 F (36.6 C) (!) 97.4 F (36.3 C) 98 F (36.7 C) 97.8 F (36.6 C)  TempSrc: Oral Oral Oral Oral  SpO2: 98% 100% 98% 99%  Weight:      Height:       Weight change:   Intake/Output Summary (Last 24 hours) at 03/21/2021 1140 Last data filed at 03/21/2021 0952 Gross per 24 hour  Intake 480 ml  Output 1150 ml  Net -670 ml    Net IO Since Admission: -2,017.66 mL [03/21/21 1140]   Physical Examination: General exam: AAOx 3, pleasant,  HEENT:Oral mucosa moist, Ear/Nose WNL grossly, dentition  normal. Respiratory system: bilaterally clear, no use of accessory muscle Cardiovascular system: S1 & S2 +, No JVD,. Gastrointestinal system: Abdomen soft, NT,ND, BS+ Nervous System:Alert, awake, moving extremities and grossly  nonfocal Extremities: Erythematous discoloration on the lower extremities Skin: No rashes,no icterus. MSK: Normal muscle bulk,tone, power .  Medications reviewed:  Scheduled Meds:  carvedilol  6.25 mg Oral BID   Chlorhexidine Gluconate Cloth  6 each Topical Daily   dexamethasone  4 mg Oral Q8H   feeding supplement  237 mL Oral TID BM   ferrous sulfate  325 mg Oral BID WC   gabapentin  300 mg Oral QHS   heparin  5,000 Units Subcutaneous Q8H   multivitamin with minerals  1 tablet Oral Daily   nicotine  14 mg Transdermal Daily   pantoprazole  40 mg Oral BID   polyethylene glycol  17 g Oral BID   senna-docusate  1 tablet Oral BID   thiamine  100 mg Oral Daily   Continuous Infusions:  Diet Order             DIET SOFT Room service appropriate? Yes; Fluid consistency: Thin  Diet effective now                   Nutrition Problem: Severe Malnutrition Etiology: chronic illness, cancer and cancer related treatments Signs/Symptoms: percent weight loss, energy intake < or equal to 75% for > or equal to 1 month, mild fat depletion, moderate muscle depletion Interventions: Ensure Enlive (each supplement provides 350kcal and 20 grams of protein), MVI  Weight change:   Wt Readings from Last 3 Encounters:  03/12/21 43.8 kg  08/16/20 56.5 kg  07/31/20 54 kg     Consultants:see note  Procedures:see note Antimicrobials: Anti-infectives (From admission, onward)    None      Culture/Microbiology    Component Value Date/Time   SDES  08/15/2020 1136    Lung, Left Performed at [facility], [place] [address] [place], [place] [address]    SPECREQUEST  08/15/2020 1136    NONE Performed at [facility], [place] [address] [place], [place] [address]    CULT  08/15/2020 1136    NO GROWTH 3 DAYS Performed at [place] [address], [place], [place] [address]    REPTSTATUS 08/18/2020 FINAL 08/15/2020 1136    Other culture-see  note  Unresulted Labs (From admission, onward)     Start     Ordered   03/22/21 0500  CBC with Differential/Platelet  Every 48 hours,   R     Question:  Specimen collection method  Answer:  Lab=Lab collect   03/20/21 0737   03/20/21 5400  Basic metabolic panel  Daily,   R     Question:  Specimen collection method  Answer:  Unit=Unit collect   03/19/21 1746          Data Reviewed: I have personally reviewed following labs and imaging studies CBC: Recent Labs  Lab 03/16/21 0528 03/17/21 0603 03/18/21 0616 03/19/21 0555 03/20/21 0505  WBC 8.3 6.2 7.1 6.0 5.3  NEUTROABS 5.5 4.1 4.4 3.8 3.4  HGB 10.3* 10.1* 10.2* 9.8* 9.6*  HCT 32.3* 31.4* 31.8* 30.6* 29.8*  MCV 105.2* 106.4* 105.3* 107.0* 106.0*  PLT 252 243 244 259 867    Basic Metabolic Panel: Recent Labs  Lab 03/17/21 0603 03/18/21 0616 03/19/21 0555 03/20/21 0505 03/21/21 0517  NA 136 134* 134* 137 134*  K 4.9 4.8 5.1 5.5*  5.3*  CL 105 101 104 102 102  CO2 26 26 26 25 27   GLUCOSE 110* 104* 111* 102* 126*  BUN 69* 75* 77* 86* 84*  CREATININE 2.48* 2.47* 2.52* 2.65* 2.58*  CALCIUM 8.7* 8.8* 8.4* 8.9 8.8*    GFR: Estimated Creatinine Clearance: 16.4 mL/min (A) (by C-G formula based on SCr of 2.58 mg/dL (H)). Liver Function Tests: Recent Labs  Lab 03/15/21 0801 03/16/21 0528 03/17/21 0603 03/18/21 0616 03/19/21 0555  AST 26 32 54* 37 31  ALT 20 32 61* 59* 54*  ALKPHOS 144* 125 124 125 108  BILITOT 0.3 0.5 0.4 0.3 0.5  PROT 6.7 6.3* 6.1* 6.4* 6.2*  ALBUMIN 2.6* 2.4* 2.4* 2.4* 2.4*    No results for input(s): LIPASE, AMYLASE in the last 168 hours. No results for input(s): AMMONIA in the last 168 hours. Coagulation Profile: No results for input(s): INR, PROTIME in the last 168 hours. Cardiac Enzymes: No results for input(s): CKTOTAL, CKMB, CKMBINDEX, TROPONINI in the last 168 hours. BNP (last 3 results) No results for input(s): PROBNP in the last 8760 hours. HbA1C: No results for input(s): HGBA1C  in the last 72 hours. CBG: No results for input(s): GLUCAP in the last 168 hours. Lipid Profile: No results for input(s): CHOL, HDL, LDLCALC, TRIG, CHOLHDL, LDLDIRECT in the last 72 hours. Thyroid Function Tests: No results for input(s): TSH, T4TOTAL, FREET4, T3FREE, THYROIDAB in the last 72 hours. Anemia Panel: No results for input(s): VITAMINB12, FOLATE, FERRITIN, TIBC, IRON, RETICCTPCT in the last 72 hours. Sepsis Labs: No results for input(s): PROCALCITON, LATICACIDVEN in the last 168 hours.  Recent Results (from the past 240 hour(s))  Resp Panel by RT-PCR (Flu A&B, Covid) Nasopharyngeal Swab     Status: None   Collection Time: 03/11/21  3:17 PM   Specimen: Nasopharyngeal Swab; Nasopharyngeal(NP) swabs in vial transport medium  Result Value Ref Range Status   SARS Coronavirus 2 by RT PCR NEGATIVE NEGATIVE Final    Comment: (NOTE) SARS-CoV-2 target nucleic acids are NOT DETECTED.  The SARS-CoV-2 RNA is generally detectable in upper respiratory specimens during the acute phase of infection. The lowest concentration of SARS-CoV-2 viral copies this assay can detect is 138 copies/mL. A negative result does not preclude SARS-Cov-2 infection and should not be used as the sole basis for treatment or other patient management decisions. A negative result may occur with  improper specimen collection/handling, submission of specimen other than nasopharyngeal swab, presence of viral mutation(s) within the areas targeted by this assay, and inadequate number of viral copies(<138 copies/mL). A negative result must be combined with clinical observations, patient history, and epidemiological information. The expected result is Negative.  Fact Sheet for Patients:  EntrepreneurPulse.com.au  Fact Sheet for Healthcare Providers:  IncredibleEmployment.be  This test is no t yet approved or cleared by the [place] FDA and  has been authorized for detection  and/or diagnosis of SARS-CoV-2 by FDA under an Emergency Use Authorization (EUA). This EUA will remain  in effect (meaning this test can be used) for the duration of the COVID-19 declaration under Section 564(b)(1) of the Act, 21 U.S.C.section 360bbb-3(b)(1), unless the authorization is terminated  or revoked sooner.       Influenza A by PCR NEGATIVE NEGATIVE Final   Influenza B by PCR NEGATIVE NEGATIVE Final    Comment: (NOTE) The Xpert Xpress SARS-CoV-2/FLU/RSV plus assay is intended as an aid in the diagnosis of influenza from Nasopharyngeal swab specimens and should not be used as a sole basis  for treatment. Nasal washings and aspirates are unacceptable for Xpert Xpress SARS-CoV-2/FLU/RSV testing.  Fact Sheet for Patients: EntrepreneurPulse.com.au  Fact Sheet for Healthcare Providers: IncredibleEmployment.be  This test is not yet approved or cleared by the [place] FDA and has been authorized for detection and/or diagnosis of SARS-CoV-2 by FDA under an Emergency Use Authorization (EUA). This EUA will remain in effect (meaning this test can be used) for the duration of the COVID-19 declaration under Section 564(b)(1) of the Act, 21 U.S.C. section 360bbb-3(b)(1), unless the authorization is terminated or revoked.  Performed at [facility], [place] [address] [place], [place] [address]       Radiology Studies: No results found.   LOS: 10 days   Antonieta Pert, MD [facility]  03/21/2021, 11:40 AM

## 2021-03-22 DIAGNOSIS — C7931 Secondary malignant neoplasm of brain: Secondary | ICD-10-CM | POA: Diagnosis not present

## 2021-03-22 DIAGNOSIS — C3490 Malignant neoplasm of unspecified part of unspecified bronchus or lung: Secondary | ICD-10-CM | POA: Diagnosis not present

## 2021-03-22 LAB — BASIC METABOLIC PANEL
Anion gap: 7 (ref 5–15)
BUN: 81 mg/dL — ABNORMAL HIGH (ref 6–20)
CO2: 27 mmol/L (ref 22–32)
Calcium: 8.8 mg/dL — ABNORMAL LOW (ref 8.9–10.3)
Chloride: 101 mmol/L (ref 98–111)
Creatinine, Ser: 2.41 mg/dL — ABNORMAL HIGH (ref 0.44–1.00)
GFR, Estimated: 23 mL/min — ABNORMAL LOW (ref >60)
Glucose, Bld: 106 mg/dL — ABNORMAL HIGH (ref 70–99)
Potassium: 5.1 mmol/L (ref 3.5–5.1)
Sodium: 135 mmol/L (ref 135–145)

## 2021-03-22 NOTE — Progress Notes (Signed)
PROGRESS NOTE    Brenda [name]  UMP:[id number] DOB: Jul 24, 1960 DOA: 03/11/2021 PCP: Merryl Hacker, No   Chief Complaint  Patient presents with   Fall  Brief Narrative/Hospital Course: Brenda [name], 60 y.o. female with PMH of for stage IV lung CA, used to follow-up with Dr. Julien Nordmann, CHF, tobacco abuse, EtOH abuse, hypoalbuminemia, polysubstance abuse. Patient presented secondary to recurrent falls likely secondary to known metastatic lung cancer with brain metastasis. Decadron started. PT ordered. Palliative care consulted. Patient is being managed with steroids and radiation therapy and followed by palliative care   Subjective: Patient seen and examined this morning resting comfortably has no complaints.  Still complaining about her [place]  being lost Assessment & Plan:  Recurrent falls with associated headache dizziness weakness and poor appetite:Deconditioning/debility debility Difficult placement: Suspect her presentation is due to metastatic cancer . Cont on Decadron, supportive care, PT OT who have recommending SNF.May need to stay inpatient to finish radiation treatment depending on how long, as pt refusing SNF, lives with boyfriend in a motel and doesn't want to go back there).  She wants to stay with her niece, but niece is not able to accommodate her. 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 Patient is interested in radiation therapy for palliation, Rad onc consulted-started XRT12/27/22.  Continue Decadron, supportive care.  Continue pain control  Left pleural effusion in the setting of stage IV lung cancer doing well on room air  Chronic combined systolic and diastolic CHF with EF 40-08% G1 DD.remains euvolemic. Continue Coreg.  Monitor weight Filed  Weights   03/11/21 1432 03/12/21 0430  Weight: 56.7 kg 43.8 kg     CKD stage IIIb renal function at baseline in twos. Anemia of chronic disease/malignancy hemoglobin is stable monitor Hyperkalemia  had Lokelma 12/25.  Potassium slightly up 5.3 monitor, add Lokelma 5 daily, cont  renal diet Recent Labs  Lab 03/18/21 0616 03/19/21 0555 03/20/21 0505 03/21/21 0517 03/22/21 0551  K 4.8 5.1 5.5* 5.3* 5.1     Polysubstance abuse history of cocaine use Alcohol abuse: Cont on folic acid thiamine and alcohol /cocaine cessation counseling  Severe malnutrition BMI 15 augment diet as tolerated.  DVT prophylaxis: heparin injection 5,000 Units Start: 03/11/21 2200 Code Status:   Code Status: Full Code Family Communication: plan of care discussed with patient at bedside. Status is: Inpatient  Remains inpatient appropriate because: Ongoing need for radiation therapy and unsafe disposition need for skilled nursing facility Disposition: Currently NOT medically stable for discharge. Anticipated Disposition: snf, DTP   Objective: Vitals last 24 hrs: Vitals:   03/21/21 0423 03/21/21 1521 03/21/21 2117 03/22/21 0541  BP: 138/83 130/77 137/88 (!) 149/93  Pulse: 76 89 81 69  Resp: 18 16 18 17   Temp: 97.8 F (36.6 C) 98.1 F (36.7 C) 98.2 F (36.8 C) 97.7 F (36.5 C)  TempSrc: Oral Oral Oral Oral  SpO2: 99% 94% 99% 100%  Weight:      Height:       Weight change:   Intake/Output Summary (Last 24 hours) at 03/22/2021 1104 Last data filed at 03/22/2021 6761 Gross per 24 hour  Intake 240 ml  Output 620 ml  Net -380 ml    Net IO Since Admission: -2,397.66 mL [03/22/21 1104]   Physical Examination: General exam: AAOx 3 older than  stated age, weak appearing. HEENT:Oral mucosa moist, Ear/Nose WNL grossly, dentition normal. Respiratory system: bilaterally diminished,  no use of accessory muscle Cardiovascular system: S1 & S2 +, No JVD,. Gastrointestinal system: Abdomen soft, NT,ND,  BS+ Nervous System:Alert, awake, moving extremities and grossly nonfocal Extremities: no edema, distal peripheral pulses palpable.  Skin: No rashes,no icterus. MSK: Normal muscle bulk,tone, power .  Medications reviewed:  Scheduled Meds:  carvedilol  6.25 mg Oral BID   Chlorhexidine Gluconate Cloth  6 each Topical Daily   dexamethasone  4 mg Oral Q8H   feeding supplement  237 mL Oral TID BM   ferrous sulfate  325 mg Oral BID WC   gabapentin  300 mg Oral QHS   heparin  5,000 Units Subcutaneous Q8H   multivitamin with minerals  1 tablet Oral Daily   nicotine  14 mg Transdermal Daily   pantoprazole  40 mg Oral BID   polyethylene glycol  17 g Oral BID   senna-docusate  1 tablet Oral BID   sodium zirconium cyclosilicate  5 g Oral Daily   thiamine  100 mg Oral Daily   Continuous Infusions:  Diet Order             DIET SOFT Room service appropriate? Yes; Fluid consistency: Thin  Diet effective now                   Nutrition Problem: Severe Malnutrition Etiology: chronic illness, cancer and cancer related treatments Signs/Symptoms: percent weight loss, energy intake < or equal to 75% for > or equal to 1 month, mild fat depletion, moderate muscle depletion Interventions: Ensure Enlive (each supplement provides 350kcal and 20 grams of protein), MVI  Weight change:   Wt Readings from Last 3 Encounters:  03/12/21 43.8 kg  08/16/20 56.5 kg  07/31/20 54 kg     Consultants:see note  Procedures:see note Antimicrobials: Anti-infectives (From admission, onward)    None      Culture/Microbiology    Component Value Date/Time   SDES  08/15/2020 1136    Lung, Left Performed at [facility], [place] [address], [place], [place] [address]    SPECREQUEST  08/15/2020 1136    NONE Performed at [facility], [place] [address] [place], [place] [address]    CULT  08/15/2020 1136    NO GROWTH 3 DAYS Performed at [place]  [address], [place], [place] [address]    REPTSTATUS 08/18/2020 FINAL 08/15/2020 1136    Other culture-see note  Unresulted Labs (From admission, onward)    None     Data Reviewed: I have personally reviewed following labs and imaging studies CBC: Recent Labs  Lab 03/16/21 0528 03/17/21 0603 03/18/21 0616 03/19/21 0555 03/20/21 0505  WBC 8.3 6.2 7.1 6.0 5.3  NEUTROABS 5.5 4.1 4.4 3.8 3.4  HGB 10.3* 10.1* 10.2* 9.8* 9.6*  HCT 32.3* 31.4* 31.8* 30.6* 29.8*  MCV 105.2* 106.4* 105.3* 107.0* 106.0*  PLT 252 243 244 259 308    Basic Metabolic Panel: Recent Labs  Lab 03/18/21 0616 03/19/21 0555 03/20/21 0505 03/21/21 0517 03/22/21 0551  NA 134* 134* 137 134* 135  K 4.8 5.1 5.5* 5.3* 5.1  CL 101 104 102 102 101  CO2 26 26 25 27 27   GLUCOSE 104* 111* 102* 126* 106*  BUN 75* 77* 86* 84* 81*  CREATININE 2.47* 2.52* 2.65* 2.58* 2.41*  CALCIUM 8.8* 8.4* 8.9 8.8* 8.8*    GFR: Estimated Creatinine Clearance: 17.6 mL/min (  A) (by C-G formula based on SCr of 2.41 mg/dL (H)). Liver Function Tests: Recent Labs  Lab 03/16/21 0528 03/17/21 0603 03/18/21 0616 03/19/21 0555  AST 32 54* 37 31  ALT 32 61* 59* 54*  ALKPHOS 125 124 125 108  BILITOT 0.5 0.4 0.3 0.5  PROT 6.3* 6.1* 6.4* 6.2*  ALBUMIN 2.4* 2.4* 2.4* 2.4*    No results for input(s): LIPASE, AMYLASE in the last 168 hours. No results for input(s): AMMONIA in the last 168 hours. Coagulation Profile: No results for input(s): INR, PROTIME in the last 168 hours. Cardiac Enzymes: No results for input(s): CKTOTAL, CKMB, CKMBINDEX, TROPONINI in the last 168 hours. BNP (last 3 results) No results for input(s): PROBNP in the last 8760 hours. HbA1C: No results for input(s): HGBA1C in the last 72 hours. CBG: No results for input(s): GLUCAP in the last 168 hours. Lipid Profile: No results for input(s): CHOL, HDL, LDLCALC, TRIG, CHOLHDL, LDLDIRECT in the last 72 hours. Thyroid Function Tests: No results for input(s): TSH,  T4TOTAL, FREET4, T3FREE, THYROIDAB in the last 72 hours. Anemia Panel: No results for input(s): VITAMINB12, FOLATE, FERRITIN, TIBC, IRON, RETICCTPCT in the last 72 hours. Sepsis Labs: No results for input(s): PROCALCITON, LATICACIDVEN in the last 168 hours.  No results found for this or any previous visit (from the past 240 hour(s)).    Radiology Studies: No results found.   LOS: 11 days   Antonieta Pert, MD [facility]  03/22/2021, 11:04 AM

## 2021-03-23 DIAGNOSIS — C7931 Secondary malignant neoplasm of brain: Secondary | ICD-10-CM | POA: Diagnosis not present

## 2021-03-23 DIAGNOSIS — C3490 Malignant neoplasm of unspecified part of unspecified bronchus or lung: Secondary | ICD-10-CM | POA: Diagnosis not present

## 2021-03-23 NOTE — Plan of Care (Signed)
  Problem: Clinical Measurements: Goal: Diagnostic test results will improve Outcome: Progressing   Problem: Nutrition: Goal: Adequate nutrition will be maintained Outcome: Progressing   

## 2021-03-23 NOTE — Progress Notes (Signed)
PROGRESS NOTE    Brenda [name]  YQM:[id number] DOB: 1960/11/17 DOA: 03/11/2021 PCP: Merryl Hacker, No   Chief Complaint  Patient presents with   Fall  Brief Narrative/Hospital Course: [name], 61 y.o. female with PMH of for stage IV lung CA, used to follow-up with Dr. Julien Nordmann, CHF, tobacco abuse, EtOH abuse, hypoalbuminemia, polysubstance abuse. Patient presented secondary to recurrent falls likely secondary to known metastatic lung cancer with brain metastasis. Decadron started. PT ordered. Palliative care consulted. Patient is being managed with steroids and radiation therapy and followed by palliative care   Subjective: Seen and examined. No new complaints No acute events overnight.  On room air. No fever overnight. Overall no change in plan  Assessment & Plan:  Recurrent falls with associated headache dizziness weakness and poor appetite:Deconditioning/debility debility Difficult placement: Suspect her presentation is due to metastatic cancer . Cont on Decadron, supportive care, PT OT who have recommending SNF.May need to stay inpatient to finish radiation treatment  until 04/01/21 as pt refusing SNF, lives with boyfriend in a motel and doesn't want to go back there).  She says she has booked an apartment and will go there upon discharge and says her niece will stay with her. It seems her niece is not able to accommodate her. Pt has been lost to follow up previously.  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 Patient is interested in radiation therapy for palliation, Rad onc consulted-started XRT12/27/22.  Continue Decadron, supportive care.  Continue pain control  Left pleural effusion in the setting of stage IV lung cancer doing well on room air  Chronic combined systolic and diastolic CHF  with EF 88-91% G1 DD.remains euvolemic. Continue Coreg.  Monitor weight Filed Weights   03/11/21 1432 03/12/21 0430  Weight: 56.7 kg 43.8 kg     CKD stage IIIb renal function at baseline in twos. Anemia of chronic disease/malignancy hemoglobin is stable monitor Hyperkalemia  had Lokelma 12/25.  Potassium slightly up 5.3 monitor, add Lokelma 5 daily, cont  renal diet Recent Labs  Lab 03/18/21 0616 03/19/21 0555 03/20/21 0505 03/21/21 0517 03/22/21 0551  K 4.8 5.1 5.5* 5.3* 5.1    Polysubstance abuse history of cocaine use Alcohol abuse: Cont on folic acid thiamine and alcohol /cocaine cessation counseling  Severe malnutrition BMI 15 augment diet as tolerated. Nutrition Problem: Severe Malnutrition Etiology: chronic illness, cancer and cancer related treatments Signs/Symptoms: percent weight loss, energy intake < or equal to 75% for > or equal to 1 month, mild fat depletion, moderate muscle depletion Interventions: Ensure Enlive (each supplement provides 350kcal and 20 grams of protein), MVI    DVT prophylaxis: heparin injection 5,000 Units Start: 03/11/21 2200 Code Status:   Code Status: Full Code Family Communication: plan of care discussed with patient at bedside. Status is: Inpatient  Remains inpatient appropriate because: Ongoing need for radiation therapy and unsafe disposition need for skilled nursing facility Disposition: Currently NOT medically stable for discharge. Anticipated Disposition: snf, DTP   Objective: Vitals last 24 hrs: Vitals:   03/22/21 1351 03/22/21 2129 03/22/21 2129 03/23/21 0429  BP: 122/75 (!) 153/86 (!) 153/86 117/76  Pulse: 90 95 95 81  Resp: 15 16 16 18   Temp: 97.6 F (36.4 C) 98.3 F (36.8 C) 98.3 F (36.8 C) 97.8 F (36.6 C)  TempSrc: Oral Oral Oral Oral  SpO2: 99% 98% 98% 99%  Weight:      Height:       Weight change:   Intake/Output Summary (Last 24 hours) at 03/23/2021 1049 Last data filed at 03/23/2021 1003 Gross per 24 hour   Intake 1681 ml  Output 1100 ml  Net 581 ml   Net IO Since Admission: -1,356.66 mL [03/23/21 1049]   Physical Examination: General exam: AAOx 3 older than stated age, weak appearing. HEENT:Oral mucosa moist, Ear/Nose WNL grossly, dentition normal. Respiratory system: bilaterally clear , no use of accessory muscle Cardiovascular system: S1 & S2 +, No JVD,. Gastrointestinal system: Abdomen soft, NT,ND, BS+ Nervous System:Alert, awake, moving extremities and grossly nonfocal Extremities: no edema, distal peripheral pulses palpable.  Skin: No rashes,no icterus. MSK: Normal muscle bulk,tone, power   Medications reviewed:  Scheduled Meds:  carvedilol  6.25 mg Oral BID   Chlorhexidine Gluconate Cloth  6 each Topical Daily   dexamethasone  4 mg Oral Q8H   feeding supplement  237 mL Oral TID BM   ferrous sulfate  325 mg Oral BID WC   gabapentin  300 mg Oral QHS   heparin  5,000 Units Subcutaneous Q8H   multivitamin with minerals  1 tablet Oral Daily   nicotine  14 mg Transdermal Daily   pantoprazole  40 mg Oral BID   polyethylene glycol  17 g Oral BID   senna-docusate  1 tablet Oral BID   thiamine  100 mg Oral Daily   Continuous Infusions:  Diet Order             DIET SOFT Room service appropriate? Yes; Fluid consistency: Thin  Diet effective now                   Nutrition Problem: Severe Malnutrition Etiology: chronic illness, cancer and cancer related treatments Signs/Symptoms: percent weight loss, energy intake < or equal to 75% for > or equal to 1 month, mild fat depletion, moderate muscle depletion Interventions: Ensure Enlive (each supplement provides 350kcal and 20 grams of protein), MVI  Weight change:   Wt Readings from Last 3 Encounters:  03/12/21 43.8 kg  08/16/20 56.5 kg  07/31/20 54 kg     Consultants:see note  Procedures:see note Antimicrobials: Anti-infectives (From admission, onward)    None      Culture/Microbiology    Component Value  Date/Time   SDES  08/15/2020 1136    Lung, Left Performed at [facility], [place] [address] [place], [place] [address]    SPECREQUEST  08/15/2020 1136    NONE Performed at [facility], [place] [address] [place], [place] [address]    CULT  08/15/2020 1136    NO GROWTH 3 DAYS Performed at [place] [address] [place],  [address]    REPTSTATUS 08/18/2020 FINAL 08/15/2020 1136    Other culture-see note  Unresulted Labs (From admission, onward)    None     Data Reviewed: I have personally reviewed following labs and imaging studies CBC: Recent Labs  Lab 03/17/21 0603 03/18/21 0616 03/19/21 0555 03/20/21 0505  WBC 6.2 7.1 6.0 5.3  NEUTROABS 4.1 4.4 3.8 3.4  HGB 10.1* 10.2* 9.8* 9.6*  HCT 31.4* 31.8* 30.6* 29.8*  MCV 106.4* 105.3* 107.0* 106.0*  PLT 243 244 259 962   Basic Metabolic Panel: Recent Labs  Lab 03/18/21 0616 03/19/21 0555 03/20/21 0505 03/21/21 0517 03/22/21 0551  NA 134* 134* 137 134* 135  K 4.8 5.1 5.5* 5.3* 5.1  CL 101  104 102 102 101  CO2 26 26 25 27 27   GLUCOSE 104* 111* 102* 126* 106*  BUN 75* 77* 86* 84* 81*  CREATININE 2.47* 2.52* 2.65* 2.58* 2.41*  CALCIUM 8.8* 8.4* 8.9 8.8* 8.8*   GFR: Estimated Creatinine Clearance: 17.6 mL/min (A) (by C-G formula based on SCr of 2.41 mg/dL (H)). Liver Function Tests: Recent Labs  Lab 03/17/21 0603 03/18/21 0616 03/19/21 0555  AST 54* 37 31  ALT 61* 59* 54*  ALKPHOS 124 125 108  BILITOT 0.4 0.3 0.5  PROT 6.1* 6.4* 6.2*  ALBUMIN 2.4* 2.4* 2.4*   No results for input(s): LIPASE, AMYLASE in the last 168 hours. No results for input(s): AMMONIA in the last 168 hours. Coagulation Profile: No results for input(s): INR, PROTIME in the last 168 hours. Cardiac Enzymes: No results for input(s): CKTOTAL, CKMB, CKMBINDEX, TROPONINI in the last 168 hours. BNP (last 3 results) No results for input(s): PROBNP in the last 8760 hours. HbA1C: No  results for input(s): HGBA1C in the last 72 hours. CBG: No results for input(s): GLUCAP in the last 168 hours. Lipid Profile: No results for input(s): CHOL, HDL, LDLCALC, TRIG, CHOLHDL, LDLDIRECT in the last 72 hours. Thyroid Function Tests: No results for input(s): TSH, T4TOTAL, FREET4, T3FREE, THYROIDAB in the last 72 hours. Anemia Panel: No results for input(s): VITAMINB12, FOLATE, FERRITIN, TIBC, IRON, RETICCTPCT in the last 72 hours. Sepsis Labs: No results for input(s): PROCALCITON, LATICACIDVEN in the last 168 hours.  No results found for this or any previous visit (from the past 240 hour(s)).    Radiology Studies: No results found.   LOS: 12 days   Antonieta Pert, MD [facility]  03/23/2021, 10:49 AM

## 2021-03-24 DIAGNOSIS — C3490 Malignant neoplasm of unspecified part of unspecified bronchus or lung: Secondary | ICD-10-CM | POA: Diagnosis not present

## 2021-03-24 DIAGNOSIS — C7931 Secondary malignant neoplasm of brain: Secondary | ICD-10-CM | POA: Diagnosis not present

## 2021-03-24 MED ORDER — NICOTINE 21 MG/24HR TD PT24
21.0000 mg | MEDICATED_PATCH | Freq: Every day | TRANSDERMAL | Status: DC
  Administered 2021-03-24 – 2021-04-05 (×13): 21 mg via TRANSDERMAL
  Filled 2021-03-24 (×14): qty 1

## 2021-03-24 MED ORDER — MELATONIN 3 MG PO TABS
3.0000 mg | ORAL_TABLET | Freq: Every day | ORAL | Status: DC
  Administered 2021-03-24 – 2021-04-04 (×12): 3 mg via ORAL
  Filled 2021-03-24 (×12): qty 1

## 2021-03-24 NOTE — Progress Notes (Signed)
PROGRESS NOTE    Brenda [name]  ZOX:[id number] DOB: 01-29-1961 DOA: 03/11/2021 PCP: Merryl Hacker, No   Chief Complaint  Patient presents with   Fall  Brief Narrative/Hospital Course: [name], 61 y.o. female with PMH of for stage IV lung CA, used to follow-up with Dr. Julien Nordmann, CHF, tobacco abuse, EtOH abuse, hypoalbuminemia, polysubstance abuse. Patient presented secondary to recurrent falls likely secondary to known metastatic lung cancer with brain metastasis. Decadron started. PT ordered. Palliative care consulted. Patient is being managed with steroids and radiation therapy and followed by palliative care   Subjective:  No new complaints resting comfortably.  Talking about going to [place] found her smoking  Assessment & Plan:  Recurrent falls with associated headache dizziness weakness and poor appetite:Deconditioning/debility debility Difficult placement: Suspect her presentation is due to metastatic cancer . Cont on Decadron, supportive care, PT OT who have recommending SNF.May need to stay inpatient to finish radiation treatment  until 04/01/21 as pt refusing SNF, lives with boyfriend in a motel and doesn't want to go back there).  She says she has booked an apartment and will go there upon discharge and says her niece will stay with her. It seems her niece is not able to accommodate her. Pt has been lost to follow up previously.  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 Patient is interested in radiation therapy for palliation, Rad onc consulted-started XRT12/27/22.  Continue Decadron, supportive care.  Continue pain control  Left pleural effusion in the setting of stage IV lung cancer doing well on room air  Chronic combined systolic and diastolic CHF with EF 81-19% G1  DD.remains euvolemic. Continue Coreg.  Monitor weight Filed Weights   03/11/21 1432 03/12/21 0430  Weight: 56.7 kg 43.8 kg     CKD stage IIIb renal function at baseline in twos. Anemia of chronic disease/malignancy hemoglobin is stable monitor Hyperkalemia  had Lokelma 12/25.  Potassium slightly up 5.3 monitor, add Lokelma 5 daily, cont  renal diet Recent Labs  Lab 03/18/21 0616 03/19/21 0555 03/20/21 0505 03/21/21 0517 03/22/21 0551  K 4.8 5.1 5.5* 5.3* 5.1     Polysubstance abuse history of cocaine use Alcohol abuse: Cont on folic acid thiamine and alcohol /cocaine cessation counseling  Severe malnutrition BMI 15 augment diet as tolerated. Nutrition Problem: Severe Malnutrition Etiology: chronic illness, cancer and cancer related treatments Signs/Symptoms: percent weight loss, energy intake < or equal to 75% for > or equal to 1 month, mild fat depletion, moderate muscle depletion Interventions: Ensure Enlive (each supplement provides 350kcal and 20 grams of protein), MVI    DVT prophylaxis: heparin injection 5,000 Units Start: 03/11/21 2200 Code Status:   Code Status: Full Code Family Communication: plan of care discussed with patient at bedside. Status is: Inpatient  Remains inpatient appropriate because: Ongoing need for radiation therapy and unsafe disposition need for skilled nursing facility Disposition: Currently NOT medically stable for discharge. Anticipated Disposition: snf, DTP   Objective: Vitals last 24 hrs: Vitals:   03/23/21 0429 03/23/21 1405 03/23/21 2023 03/24/21 0721  BP: 117/76 118/74 134/75 (!) 141/84  Pulse: 81 88 92 75  Resp: 18 18 17 17   Temp: 97.8 F (36.6 C) 98 F (36.7 C) 98 F (36.7 C) 97.9 F (36.6 C)  TempSrc: Oral Oral Oral Oral  SpO2: 99% 98% 98% 97%  Weight:  Height:       Weight change:   Intake/Output Summary (Last 24 hours) at 03/24/2021 1212 Last data filed at 03/24/2021 1100 Gross per 24 hour  Intake 695 ml  Output  400 ml  Net 295 ml    Net IO Since Admission: -1,061.66 mL [03/24/21 1212]   Physical Examination: General exam: AAOx 3, older than stated age, weak appearing. HEENT:Oral mucosa moist, Ear/Nose WNL grossly, dentition normal. Respiratory system: bilaterally diminished, no use of accessory muscle Cardiovascular system: S1 & S2 +, No JVD,. Gastrointestinal system: Abdomen soft, NT,ND, BS+ Nervous System:Alert, awake, moving extremities and grossly nonfocal Extremities: no edema, distal peripheral pulses palpable.  Skin: No rashes,no icterus. MSK: Normal muscle bulk,tone, power   Medications reviewed:  Scheduled Meds:  carvedilol  6.25 mg Oral BID   Chlorhexidine Gluconate Cloth  6 each Topical Daily   dexamethasone  4 mg Oral Q8H   feeding supplement  237 mL Oral TID BM   ferrous sulfate  325 mg Oral BID WC   gabapentin  300 mg Oral QHS   heparin  5,000 Units Subcutaneous Q8H   multivitamin with minerals  1 tablet Oral Daily   nicotine  21 mg Transdermal Daily   pantoprazole  40 mg Oral BID   polyethylene glycol  17 g Oral BID   senna-docusate  1 tablet Oral BID   thiamine  100 mg Oral Daily   Continuous Infusions:  Diet Order             DIET SOFT Room service appropriate? Yes; Fluid consistency: Thin  Diet effective now                   Nutrition Problem: Severe Malnutrition Etiology: chronic illness, cancer and cancer related treatments Signs/Symptoms: percent weight loss, energy intake < or equal to 75% for > or equal to 1 month, mild fat depletion, moderate muscle depletion Interventions: Ensure Enlive (each supplement provides 350kcal and 20 grams of protein), MVI  Weight change:   Wt Readings from Last 3 Encounters:  03/12/21 43.8 kg  08/16/20 56.5 kg  07/31/20 54 kg     Consultants:see note  Procedures:see note Antimicrobials: Anti-infectives (From admission, onward)    None      Culture/Microbiology    Component Value Date/Time   SDES   08/15/2020 1136    Lung, Left Performed at [facility], [place] [address], [place], [place] [address]    SPECREQUEST  08/15/2020 1136    NONE Performed at [facility], [place] [address] [place], [place] [address]    CULT  08/15/2020 1136    NO GROWTH 3 DAYS Performed at [place] [address] [place], [place] [address]    REPTSTATUS 08/18/2020 FINAL 08/15/2020 1136    Other culture-see note  Unresulted Labs (From admission, onward)    None     Data Reviewed: I have personally reviewed following labs and imaging studies CBC: Recent Labs  Lab 03/18/21 0616 03/19/21 0555 03/20/21 0505  WBC 7.1 6.0 5.3  NEUTROABS 4.4 3.8 3.4  HGB 10.2* 9.8* 9.6*  HCT 31.8* 30.6* 29.8*  MCV 105.3* 107.0* 106.0*  PLT 244 259 195    Basic Metabolic Panel: Recent Labs  Lab 03/18/21 0616 03/19/21 0555 03/20/21 0505 03/21/21 0517 03/22/21 0551  NA 134* 134* 137 134* 135  K 4.8 5.1 5.5* 5.3* 5.1  CL 101 104 102 102 101  CO2 26 26 25 27 27   GLUCOSE  104* 111* 102* 126* 106*  BUN 75* 77* 86* 84* 81*  CREATININE 2.47* 2.52* 2.65* 2.58* 2.41*  CALCIUM 8.8* 8.4* 8.9 8.8* 8.8*    GFR: Estimated Creatinine Clearance: 17.6 mL/min (A) (by C-G formula based on SCr of 2.41 mg/dL (H)). Liver Function Tests: Recent Labs  Lab 03/18/21 0616 03/19/21 0555  AST 37 31  ALT 59* 54*  ALKPHOS 125 108  BILITOT 0.3 0.5  PROT 6.4* 6.2*  ALBUMIN 2.4* 2.4*    No results for input(s): LIPASE, AMYLASE in the last 168 hours. No results for input(s): AMMONIA in the last 168 hours. Coagulation Profile: No results for input(s): INR, PROTIME in the last 168 hours. Cardiac Enzymes: No results for input(s): CKTOTAL, CKMB, CKMBINDEX, TROPONINI in the last 168 hours. BNP (last 3 results) No results for input(s): PROBNP in the last 8760 hours. HbA1C: No results for input(s): HGBA1C in the last 72 hours. CBG: No results for input(s): GLUCAP in the last 168  hours. Lipid Profile: No results for input(s): CHOL, HDL, LDLCALC, TRIG, CHOLHDL, LDLDIRECT in the last 72 hours. Thyroid Function Tests: No results for input(s): TSH, T4TOTAL, FREET4, T3FREE, THYROIDAB in the last 72 hours. Anemia Panel: No results for input(s): VITAMINB12, FOLATE, FERRITIN, TIBC, IRON, RETICCTPCT in the last 72 hours. Sepsis Labs: No results for input(s): PROCALCITON, LATICACIDVEN in the last 168 hours.  No results found for this or any previous visit (from the past 240 hour(s)).    Radiology Studies: No results found.   LOS: 13 days   Antonieta Pert, MD [facility]  03/24/2021, 12:12 PM

## 2021-03-24 NOTE — Progress Notes (Signed)
During the change of shift report, NA reported of smell cigarette the pt's  room. RN went to the pt's room, there was a strong smell of cigarette. Asked the pt, who admitted that she was smoking. RN called Camera operator and searched pt's pocket book, found 5 sticks of cigarettes in a pack. Took it, labelled and put it on nursing station. MD aware. Nicotine dosage  increased. Will continue to monitor.

## 2021-03-24 NOTE — Progress Notes (Signed)
Physical Therapy Treatment Patient Details Name: Brenda [name] MRN: [id number] DOB: 08-21-1960 Today's Date: 03/24/2021   History of Present Illness 61 years old female adm with AMS/confusion and falls. 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  Large cystic mass in the right occipital parietal lobe with prior  hemorrhage and local mass-effect. Multiple additional metastatic  lesions as above".  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 Pt refused recommended SNF level rehab following last hospital admit.    PT Comments    The patient demonstrates improved LLe   movement, still not functional to ambulate as had been earlier. Patient with decreased sensation U and LE on left. Patient worked on sitting and standing balance.  Patient continues with decreased awareness of deficits. Rec. SNf/    Recommendations for follow up therapy are one component of a multi-disciplinary discharge planning process, led by the attending physician.  Recommendations may be updated based on patient status, additional functional criteria and insurance authorization.  Follow Up Recommendations  Skilled nursing-short term rehab (<3 hours/day)     Assistance Recommended at Discharge Frequent or constant Supervision/Assistance  Equipment Recommendations  Wheelchair (measurements PT);Wheelchair cushion (measurements PT)    Recommendations for Other Services       Precautions / Restrictions Precautions Precautions: Fall Precaution Comments: left hemiparesis     Mobility  Bed Mobility   Bed Mobility: Supine to Sit;Sit to Supine     Supine to sit: Min assist Sit to supine: Min assist   General bed mobility comments: cues for  safety, extra time  to move legs, use of bed rail with Right hand. Tends to neglect L UE. Patient is able to get the  left leg to bed  edge with min assistance. Min assist to sit up, mon support to trnasition  right hand from rail to support on bed, posterior bias    Transfers Overall transfer level: Needs assistance Equipment used: Rolling walker (2 wheels) Transfers: Sit to/from Stand Sit to Stand: Max assist           General transfer comment: max support  of the Left hand on the RW, unable to maintain any grip., decxreased Leftleg support, knee buckling , no weight taken if steps    Ambulation/Gait               General Gait Details: unable due to left  motor deficits   Stairs             Wheelchair Mobility    Modified Rankin (Stroke Patients Only)       Balance Overall balance assessment: History of Falls;Needs assistance Sitting-balance support: Single extremity supported;Feet supported Sitting balance-Leahy Scale: Fair Sitting balance - Comments: cues to reposition self as tends to drift to left Postural control: Posterior lean;Left lateral lean Standing balance support: During functional activity;Reliant on assistive device for balance;Single extremity supported Standing balance-Leahy Scale: Poor Standing balance comment: support at left hand and left knee                            Cognition Arousal/Alertness: Awake/alert Behavior During Therapy: Impulsive;Anxious Overall Cognitive Status: No family/caregiver present to determine baseline cognitive functioning  General Comments: patient less impulsive today, able to  stay on task with cues. asking about getting into a WC        Exercises      General Comments        Pertinent Vitals/Pain Pain Assessment: No/denies pain    Home Living                          Prior Function            PT Goals (current goals can now be found in the care plan section) Progress towards PT goals: Progressing toward goals    Frequency           PT Plan  Current plan remains appropriate    Co-evaluation              AM-PAC PT "6 Clicks" Mobility   Outcome Measure  Help needed turning from your back to your side while in a flat bed without using bedrails?: A Little Help needed moving from lying on your back to sitting on the side of a flat bed without using bedrails?: A Little Help needed moving to and from a bed to a chair (including a wheelchair)?: A Lot Help needed standing up from a chair using your arms (e.g., wheelchair or bedside chair)?: A Lot Help needed to walk in hospital room?: Total Help needed climbing 3-5 steps with a railing? : Total 6 Click Score: 12    End of Session Equipment Utilized During Treatment: Gait belt Activity Tolerance: Patient tolerated treatment well Patient left: in bed;with call bell/phone within reach;with bed alarm set Nurse Communication: Mobility status PT Visit Diagnosis: Difficulty in walking, not elsewhere classified (R26.2);History of falling (Z91.81)     Time: 7371-0626 PT Time Calculation (min) (ACUTE ONLY): 25 min  Charges:  $Therapeutic Activity: 8-22 mins $Neuromuscular Re-education: 8-22 mins                     Tresa Endo PT Acute Rehabilitation Services Pager [phone] Office [phone]    Claretha Cooper 03/24/2021, 3:47 PM

## 2021-03-25 ENCOUNTER — Ambulatory Visit
Admission: RE | Admit: 2021-03-25 | Discharge: 2021-03-25 | Disposition: A | Discharge status: Home / Self Care | Payer: Medicaid Other | Source: Ambulatory Visit | Attending: Radiation Oncology | Admitting: Radiation Oncology

## 2021-03-25 DIAGNOSIS — C3490 Malignant neoplasm of unspecified part of unspecified bronchus or lung: Secondary | ICD-10-CM | POA: Diagnosis not present

## 2021-03-25 DIAGNOSIS — C7931 Secondary malignant neoplasm of brain: Secondary | ICD-10-CM | POA: Diagnosis not present

## 2021-03-25 NOTE — Progress Notes (Signed)
PROGRESS NOTE    Brenda [name]  AOZ:[id number] DOB: [name] DOA: 03/11/2021 PCP: Brenda [name], No   Chief Complaint  Patient presents with   Fall  Brief Narrative/Hospital Course: Brenda [name], 61 y.o. female with PMH of for stage IV lung CA, used to follow-up with Dr. Julien [name], CHF, tobacco abuse, EtOH abuse, hypoalbuminemia, polysubstance abuse. Patient presented secondary to recurrent falls likely secondary to known metastatic lung cancer with brain metastasis. Decadron started. PT ordered. Palliative care consulted. Patient is being managed with steroids and radiation therapy and followed by palliative care   Subjective: No complaints.  Would like to work with PT today.  Assessment & Plan:  Recurrent falls with associated headache dizziness weakness and poor appetite:Deconditioning/debility debility Difficult placement: Suspect her presentation is due to metastatic cancer . Cont on Decadron, supportive care, PT OT who have recommending SNF.May need to stay inpatient to finish radiation treatment  until 04/01/21 as pt refusing SNF, lives with boyfriend in a motel and doesn't want to go back there).  She says she has booked an apartment and will go there upon discharge and says her niece will stay with her. It seems her niece is not able to accommodate her. Pt has been lost to follow up previously.  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 Patient is interested in radiation therapy for palliation, Rad onc consulted-started XRT12/27/22-continued through 04/01/21.  Continue Decadron, supportive care.  Continue pain control  Left pleural effusion in the setting of stage IV lung cancer doing well on room air  Chronic combined systolic and diastolic CHF with EF  96-29% G1 DD.remains euvolemic. Continue Coreg.  Monitor weight Filed Weights   03/11/21 1432 03/12/21 0430  Weight: 56.7 kg 43.8 kg     CKD stage IIIb renal function at baseline in twos. Anemia of chronic disease/malignancy hemoglobin is stable monitor Hyperkalemia  had Lokelma 12/25.  Potassium slightly up 5.3 monitor, add Lokelma 5 daily, cont  renal diet Recent Labs  Lab 03/19/21 0555 03/20/21 0505 03/21/21 0517 03/22/21 0551  K 5.1 5.5* 5.3* 5.1     Polysubstance abuse history of cocaine use Alcohol abuse: Cont on folic acid thiamine and alcohol /cocaine cessation counseling  Severe malnutrition BMI 15 augment diet as tolerated. Nutrition Problem: Severe Malnutrition Etiology: chronic illness, cancer and cancer related treatments Signs/Symptoms: percent weight loss, energy intake < or equal to 75% for > or equal to 1 month, mild fat depletion, moderate muscle depletion Interventions: Ensure Enlive (each supplement provides 350kcal and 20 grams of protein), MVI    DVT prophylaxis: heparin injection 5,000 Units Start: 03/11/21 2200 Code Status:   Code Status: Full Code Family Communication: plan of care discussed with patient at bedside. Status is: Inpatient  Remains inpatient appropriate because: Ongoing need for radiation therapy and unsafe disposition need for skilled nursing facility Disposition: Currently NOT medically stable for discharge. Anticipated Disposition: Refusing skilled nursing facility anticipate discharge home after completion of radiation-TOC on board and following   Objective: Vitals last 24 hrs: Vitals:   03/24/21 0721 03/24/21 1411 03/24/21 2011 03/25/21 0453  BP: (!) 141/84 127/77 (!) 157/90 130/77  Pulse: 75 96 100 87  Resp: 17 17 16 18   Temp: 97.9 F (36.6 C) 98.2 F (36.8 C) 98.6 F (37 C) 98.7 F (37.1 C)  TempSrc: Oral Oral  Oral Oral  SpO2: 97% 98% 97% 99%  Weight:      Height:       Weight change:   Intake/Output Summary (Last  24 hours) at 03/25/2021 1210 Last data filed at 03/25/2021 4098 Gross per 24 hour  Intake 470 ml  Output 2200 ml  Net -1730 ml    Net IO Since Admission: -2,791.66 mL [03/25/21 1210]   Physical Examination: General exam: AAOx3,  HEENT:Oral mucosa moist, Ear/Nose WNL grossly, dentition normal. Respiratory system: bilaterally clear,no use of accessory muscle Cardiovascular system: S1 & S2 +, No JVD,. Gastrointestinal system: Abdomen soft, NT,ND, BS+ Nervous System:Alert, awake, moving extremities and grossly nonfocal Extremities: no edema, distal peripheral pulses palpable.  Skin: No rashes,no icterus. MSK: Normal muscle bulk,tone, power   Medications reviewed:  Scheduled Meds:  carvedilol  6.25 mg Oral BID   Chlorhexidine Gluconate Cloth  6 each Topical Daily   dexamethasone  4 mg Oral Q8H   feeding supplement  237 mL Oral TID BM   ferrous sulfate  325 mg Oral BID WC   gabapentin  300 mg Oral QHS   heparin  5,000 Units Subcutaneous Q8H   melatonin  3 mg Oral QHS   multivitamin with minerals  1 tablet Oral Daily   nicotine  21 mg Transdermal Daily   pantoprazole  40 mg Oral BID   polyethylene glycol  17 g Oral BID   senna-docusate  1 tablet Oral BID   thiamine  100 mg Oral Daily   Continuous Infusions:  Diet Order             DIET SOFT Room service appropriate? Yes; Fluid consistency: Thin  Diet effective now                   Nutrition Problem: Severe Malnutrition Etiology: chronic illness, cancer and cancer related treatments Signs/Symptoms: percent weight loss, energy intake < or equal to 75% for > or equal to 1 month, mild fat depletion, moderate muscle depletion Interventions: Ensure Enlive (each supplement provides 350kcal and 20 grams of protein), MVI  Weight change:   Wt Readings from Last 3 Encounters:  03/12/21 43.8 kg  08/16/20 56.5 kg  07/31/20 54 kg     Consultants:see note  Procedures:see note Antimicrobials: Anti-infectives (From admission,  onward)    None      Culture/Microbiology    Component Value Date/Time   SDES  08/15/2020 1136    Lung, Left Performed at [facility], [place] [address] [place], [place] [address]    SPECREQUEST  08/15/2020 1136    NONE Performed at [facility], [place] [address], [place], [place] [address]    CULT  08/15/2020 1136    NO GROWTH 3 DAYS Performed at [place] [address], [place], [place] [address]    REPTSTATUS 08/18/2020 FINAL 08/15/2020 1136    Other culture-see note  Unresulted Labs (From admission, onward)    None     Data Reviewed: I have personally reviewed following labs and imaging studies CBC: Recent Labs  Lab 03/19/21 0555 03/20/21 0505  WBC 6.0 5.3  NEUTROABS 3.8 3.4  HGB 9.8* 9.6*  HCT 30.6* 29.8*  MCV 107.0* 106.0*  PLT 259 865    Basic Metabolic Panel: Recent Labs  Lab 03/19/21 0555 03/20/21 0505 03/21/21 0517 03/22/21 0551  NA 134* 137 134* 135  K 5.1 5.5* 5.3* 5.1  CL 104 102 102 101  CO2 26 25 27  27  GLUCOSE 111* 102* 126* 106*  BUN 77* 86* 84* 81*  CREATININE 2.52* 2.65* 2.58* 2.41*  CALCIUM 8.4* 8.9 8.8* 8.8*    GFR: Estimated Creatinine Clearance: 17.6 mL/min (A) (by C-G formula based on SCr of 2.41 mg/dL (H)). Liver Function Tests: Recent Labs  Lab 03/19/21 0555  AST 31  ALT 54*  ALKPHOS 108  BILITOT 0.5  PROT 6.2*  ALBUMIN 2.4*    No results for input(s): LIPASE, AMYLASE in the last 168 hours. No results for input(s): AMMONIA in the last 168 hours. Coagulation Profile: No results for input(s): INR, PROTIME in the last 168 hours. Cardiac Enzymes: No results for input(s): CKTOTAL, CKMB, CKMBINDEX, TROPONINI in the last 168 hours. BNP (last 3 results) No results for input(s): PROBNP in the last 8760 hours. HbA1C: No results for input(s): HGBA1C in the last 72 hours. CBG: No results for input(s): GLUCAP in the last 168 hours. Lipid Profile: No results for input(s):  CHOL, HDL, LDLCALC, TRIG, CHOLHDL, LDLDIRECT in the last 72 hours. Thyroid Function Tests: No results for input(s): TSH, T4TOTAL, FREET4, T3FREE, THYROIDAB in the last 72 hours. Anemia Panel: No results for input(s): VITAMINB12, FOLATE, FERRITIN, TIBC, IRON, RETICCTPCT in the last 72 hours. Sepsis Labs: No results for input(s): PROCALCITON, LATICACIDVEN in the last 168 hours.  No results found for this or any previous visit (from the past 240 hour(s)).    Radiology Studies: No results found.   LOS: 14 days   Antonieta Pert, MD [facility]  03/25/2021, 12:10 PM

## 2021-03-25 NOTE — Progress Notes (Signed)
Nutrition Follow-up  DOCUMENTATION CODES:   Severe malnutrition in context of chronic illness, Underweight  INTERVENTION:   -Ensure Enlive po TID, each supplement provides 350 kcal and 20 grams of protein   -Multivitamin with minerals daily   NUTRITION DIAGNOSIS:   Severe Malnutrition related to chronic illness, cancer and cancer related treatments as evidenced by percent weight loss, energy intake < or equal to 75% for > or equal to 1 month, mild fat depletion, moderate muscle depletion.  Ongoing  GOAL:   Patient will meet greater than or equal to 90% of their needs  Progressing.  MONITOR:   PO intake, Supplement acceptance, Labs, Weight trends, I & O's  ASSESSMENT:   61 y.o. female with PMH significant for stage IV lung CA, used to follow-up with Dr. Julien Nordmann, CHF, HIV +,  tobacco abuse, EtOH abuse, hypoalbuminemia, polysubstance abuse presented to the ED with complaints of recurrent falls.  12/27: started XRT: expected completion 1/10  Patient consuming 100% of meals. Pt accepting protein supplements.  Weight 12/21: 96 lbs. Recommend updated weight for admission.  Medications: Ferrous sulfate, Multivitamin with minerals daily, Miralax, Senokot, Thiamine  Labs reviewed.  Diet Order:   Diet Order             DIET SOFT Room service appropriate? Yes; Fluid consistency: Thin  Diet effective now                   EDUCATION NEEDS:   Education needs have been addressed  Skin:  Skin Assessment: Reviewed RN Assessment  Last BM:  12/27 -type 4  Height:   Ht Readings from Last 1 Encounters:  03/12/21 5\' 6"  (1.676 m)    Weight:   Wt Readings from Last 1 Encounters:  03/12/21 43.8 kg    BMI:  Body mass index is 15.59 kg/m.  Estimated Nutritional Needs:   Kcal:  2000-2200  Protein:  90-110g  Fluid:  2.2L/day  Clayton Bibles, MS, RD, LDN Inpatient Clinical Dietitian Contact information available via Amion

## 2021-03-26 ENCOUNTER — Ambulatory Visit
Admit: 2021-03-26 | Discharge: 2021-03-26 | Disposition: A | Discharge status: Home / Self Care | Payer: Medicaid Other | Attending: Radiation Oncology | Admitting: Radiation Oncology

## 2021-03-26 DIAGNOSIS — C3490 Malignant neoplasm of unspecified part of unspecified bronchus or lung: Secondary | ICD-10-CM | POA: Diagnosis not present

## 2021-03-26 DIAGNOSIS — C7931 Secondary malignant neoplasm of brain: Secondary | ICD-10-CM | POA: Diagnosis not present

## 2021-03-26 NOTE — TOC Progression Note (Signed)
Transition of Care [facility]) - Progression Note    Patient Details  Name: Brenda [name] MRN: [id number] Date of Birth: Jan 18, 1961  Transition of Care [facility]) CM/SW Contact  Tyeasha Ebbs, Marjie Skiff, RN Phone Number: 03/26/2021, 2:54 PM  Clinical Narrative:    Spoke with pt again for dc planning. Pt now agrees to being faxed out for SNF. FL2 faxed out and TOC will provide SNF bed options when available.   Expected Discharge Plan: Skilled Nursing Facility Barriers to Discharge: Continued Medical Work up  Expected Discharge Plan and Services Expected Discharge Plan: [place]   Discharge Planning Services: CM Consult   Living arrangements for the past 2 months: Single Family Home                           HH Arranged: PT, OT [facility] Agency: [place] Date [facility] Agency Contacted: 03/14/21 Time [place]: 1139 Representative spoke with at [place]: [place] ([place]) Interventions    Readmission Risk Interventions Readmission Risk Prevention Plan 03/14/2021 07/17/2020 04/11/2020  Transportation Screening Complete Complete Complete  PCP or Specialist Appt within 3-5 Days - - Complete  HRI or [place] - Complete Complete  Social Work Consult for [place] Planning/Counseling - Complete Complete  Palliative Care Screening - Not Applicable Not Applicable  Medication Review Press photographer) Complete Complete Complete  PCP or Specialist appointment within 3-5 days of discharge Complete - -  [place] or Home Care Consult Complete - -  SW Recovery Care/Counseling Consult Complete - -  Palliative Care Screening Complete - -  [place] Patient Refused - -  Some recent data might be hidden

## 2021-03-26 NOTE — Progress Notes (Signed)
Occupational Therapy Treatment Patient Details Name: Brenda [name] MRN: [id number] DOB: February 26, 1961 Today's Date: 03/26/2021   History of present illness 61 years old female adm with AMS/confusion and falls. 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  Large cystic mass in the right occipital parietal lobe with prior  hemorrhage and local mass-effect. Multiple additional metastatic  lesions as above".  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 Pt refused recommended SNF level rehab following last hospital admit.   OT comments  Patient supine in bed when therapist entered the room. Patient holding LUE but not actively using it. Patient educated on purpose of therapy. Attempted to get patient to sit at edge of bed - with patient requiring physical assistance to initiate movement. Mid transfer patient reported dizziness and threw herself back on to the bed. Despite max encouragement, max education and attempts to physically assist patient - patient would not participate. PT in room to attempt to assist and encourage patient was well. Patient reported she needed to use the bathroom but would not assist to get out of the bed and patient began to get irritated with therapists. She has significant cognitive impairments with no insight into her deficits.  Treatment note wrote due to time in room but no charge will be rendered.    Recommendations for follow up therapy are one component of a multi-disciplinary discharge planning process, led by the attending physician.  Recommendations may be updated based on patient status, additional functional criteria and insurance authorization.    Follow Up Recommendations  Skilled nursing-short term rehab (<3 hours/day)    Assistance Recommended at Discharge Frequent or constant Supervision/Assistance  Patient can return  home with the following  Two people to help with walking and/or transfers;Two people to help with bathing/dressing/bathroom;Direct supervision/assist for medications management;Assist for transportation;Assistance with cooking/housework;Direct supervision/assist for financial management   Equipment Recommendations       Recommendations for Other Services      Precautions / Restrictions Precautions Precautions: Fall Precaution Comments: left hemiparesis, left neglect, inattention Restrictions Weight Bearing Restrictions: No         Cognition Arousal/Alertness: Awake/alert Behavior During Therapy: Impulsive;Agitated (became agitated with therapists) Overall Cognitive Status: Impaired/Different from baseline Area of Impairment: Attention;Memory;Following commands;Safety/judgement;Awareness;Problem solving                       Following Commands: Follows one step commands inconsistently Safety/Judgement: Decreased awareness of safety;Decreased awareness of deficits Awareness: Intellectual Problem Solving: Requires verbal cues;Requires tactile cues;Decreased initiation;Difficulty sequencing                       Pertinent Vitals/ Pain       Pain Assessment: No/denies pain   Frequency  Min 2X/week        Progress Toward Goals  OT Goals(current goals can now be found in the care plan section)  Progress towards OT goals: OT to reassess next treatment     Plan Discharge plan remains appropriate    Co-evaluation                 AM-PAC OT "6 Clicks" Daily Activity     Outcome Measure   Help from another person eating meals?: A Little Help from another person taking care of personal grooming?: A Little Help from another person toileting, which includes using toliet, bedpan, or urinal?: A Lot  Help from another person bathing (including washing, rinsing, drying)?: A Lot Help from another person to put on and taking off regular upper body  clothing?: A Lot Help from another person to put on and taking off regular lower body clothing?: A Lot 6 Click Score: 14    End of Session    OT Visit Diagnosis: Unsteadiness on feet (R26.81);Hemiplegia and hemiparesis;History of falling (Z91.81);Repeated falls (R29.6);Muscle weakness (generalized) (M62.81);Other symptoms and signs involving cognitive function;Apraxia (R48.2);Other symptoms and signs involving the nervous system (R29.898) Hemiplegia - Right/Left: Left Hemiplegia - dominant/non-dominant: Non-Dominant Hemiplegia - caused by: Unspecified   Activity Tolerance     Patient Left in bed;with call bell/phone within reach;with bed alarm set   Nurse Communication Mobility status        Time: 1505-6979 OT Time Calculation (min): 14 min  Charges: OT General Charges $OT Visit:  (no charge will be applied)  Derl Barrow, OTR/L [place]  Office [phone] Pager: [place] 03/26/2021, 12:32 PM
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
## 2021-03-26 NOTE — NC FL2 (Signed)
[place] LEVEL OF CARE SCREENING TOOL     IDENTIFICATION  Patient Name: Brenda [name] Birthdate: 1960/12/16 Sex: female Admission Date (Current Location): 03/11/2021  [facility] and [place] Number:  Herbalist and Address:  [facility],  [place] [address] [place]      Provider Number: [id number]  Attending Physician Name and Address:  Nita Sells, MD  Relative Name and Phone Number:       Current Level of Care: Hospital Recommended Level of Care: [place] Prior Approval Number:    Date Approved/Denied:   PASRR Number: [id number]  Discharge Plan: SNF    Current Diagnoses: Patient Active Problem List   Diagnosis Date Noted   Protein-calorie malnutrition, severe 03/12/2021   Primary malignant neoplasm of lung with metastasis to brain ([place]) 03/11/2021   Exudative pleural effusion    Sinus tachycardia    Pleural effusion    S/P thoracentesis    Hypomagnesemia    Acute on chronic combined systolic and diastolic congestive heart failure ([place])    Bacteremia associated with intravascular line ([place]) 07/03/2020   Acute blood loss anemia    Renal failure 07/01/2020   AKI (acute kidney injury) ([place])    Metabolic acidosis    Epistaxis    Seizure-like activity (HCC)    Swelling of lower extremity 05/29/2020   Hypoalbuminemia 05/29/2020   Acute on chronic anemia 04/07/2020   Gastrointestinal hemorrhage 04/07/2020   Acute encephalopathy 04/07/2020   Pancytopenia ([place]) 04/07/2020   Polysubstance abuse ([place]) 04/07/2020   Hypokalemia 10/19/2019   Neutropenia ([place]) 10/05/2019   Malignant neoplasm of right lung ([place]) 09/14/2019   Encounter for antineoplastic chemotherapy 09/14/2019   Encounter for antineoplastic immunotherapy 09/14/2019   Goals of care, counseling/discussion 09/14/2019   Tobacco abuse counseling 09/14/2019   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 ([place])    Alcohol abuse    Acute respiratory failure ([place])  04/03/2019   Lung mass    Multifocal pneumonia    Major depressive disorder    Major depressive disorder, recurrent episode with mood-congruent psychotic features ([place]) 05/30/2017   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[place]) 12/26/2011    Orientation RESPIRATION BLADDER Height & Weight     Self, Time, Place  Normal Incontinent Weight: 43.8 kg Height:  5\' 6"  (167.6 cm)  BEHAVIORAL SYMPTOMS/MOOD NEUROLOGICAL BOWEL NUTRITION STATUS      Incontinent Diet  AMBULATORY STATUS COMMUNICATION OF NEEDS Skin   Extensive Assist (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) Verbally Skin abrasions                       Personal Care Assistance Level of Assistance  Bathing, Feeding, Dressing Bathing Assistance: Limited assistance Feeding assistance: Independent Dressing Assistance: Limited assistance     Functional Limitations Info  Sight, Hearing, Speech Sight Info: Impaired Hearing Info: Adequate Speech Info: Adequate    SPECIAL CARE FACTORS FREQUENCY  PT (By licensed PT), OT (By licensed OT)     PT Frequency: 5 x weekly OT Frequency: 5 x weekly            Contractures Contractures Info: Not present    Additional Factors Info  Code Status, Allergies Code Status Info: Full Allergies Info: Aspirin           Current Medications (03/26/2021):  This is the current hospital active medication list Current Facility-Administered Medications  Medication Dose Route Frequency Provider Last Rate Last Admin   acetaminophen (TYLENOL) tablet 650 mg  650 mg Oral Q6H PRN Shawna Clamp, MD   650 mg at 03/20/21 0013   Or   acetaminophen (TYLENOL) suppository 650 mg  650 mg Rectal Q6H PRN Shawna Clamp, MD       carvedilol (COREG) tablet 6.25 mg  6.25 mg Oral BID Alma Friendly, MD   6.25 mg at 03/26/21 1023   Chlorhexidine Gluconate Cloth 2 % PADS 6 each  6 each Topical Daily Alma Friendly, MD   6 each at 03/26/21 1023   dexamethasone (DECADRON) tablet  4 mg  4 mg Oral Q8H Shawna Clamp, MD   4 mg at 03/26/21 0556   feeding supplement (ENSURE ENLIVE / ENSURE PLUS) liquid 237 mL  237 mL Oral TID BM Mariel Aloe, MD   237 mL at 03/26/21 1023   ferrous sulfate tablet 325 mg  325 mg Oral BID WC Shawna Clamp, MD   325 mg at 03/26/21 0901   gabapentin (NEURONTIN) capsule 300 mg  300 mg Oral QHS Shawna Clamp, MD   300 mg at 03/25/21 2132   heparin injection 5,000 Units  5,000 Units Subcutaneous Q8H Shawna Clamp, MD   5,000 Units at 03/26/21 0556   melatonin tablet 3 mg  3 mg Oral QHS Foust, Katy L, NP   3 mg at 03/25/21 2131   multivitamin with minerals tablet 1 tablet  1 tablet Oral Daily Mariel Aloe, MD   1 tablet at 03/26/21 1023   nicotine (NICODERM CQ - dosed in mg/24 hours) patch 21 mg  21 mg Transdermal Daily Kc, Ramesh, MD   21 mg at 03/26/21 1024   ondansetron (ZOFRAN) tablet 4 mg  4 mg Oral Q6H PRN Shawna Clamp, MD       Or   ondansetron [facility] [place]) injection 4 mg  4 mg Intravenous Q6H PRN Shawna Clamp, MD       oxyCODONE (Oxy IR/ROXICODONE) immediate release tablet 5 mg  5 mg Oral Q6H PRN Shawna Clamp, MD   5 mg at 03/25/21 1659   pantoprazole (PROTONIX) EC tablet 40 mg  40 mg Oral BID Shawna Clamp, MD   40 mg at 03/26/21 1023   polyethylene glycol (MIRALAX / GLYCOLAX) packet 17 g  17 g Oral BID Shawna Clamp, MD   17 g at 03/26/21 1022   senna-docusate (Senokot-S) tablet 1 tablet  1 tablet Oral BID Alma Friendly, MD   1 tablet at 03/26/21 1023   thiamine tablet 100 mg  100 mg Oral Daily Shawna Clamp, MD   100 mg at 03/26/21 1023     Discharge Medications: Please see discharge summary for a list of discharge medications.  Relevant Imaging Results:  Relevant Lab Results:   Additional Information [phone]  Anarosa Kubisiak, Marjie Skiff, RN

## 2021-03-26 NOTE — Progress Notes (Signed)
PROGRESS NOTE    Brenda [name]  OQH:[id number] DOB: 11-29-1960 DOA: 03/11/2021 PCP: Merryl Hacker, No   Chief Complaint  Patient presents with   [facility] Course: [name], 61 y.o. female  stage IV T2A N2 M1B NSCLC lung CA used to follow-up with Dr. 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   Patient presented 03/11/2021 secondary to recurrent falls likely secondary to known metastatic lung cancer with brain metastasis. Decadron started. PT ordered. Palliative care consulted. Patient is being managed with steroids and radiation therapy and followed by palliative care    Subjective: claims that she wants to work with therapy however seems to also be intermittently confused and hallucinating  Assessment & Plan:  Recurrent falls with associated headache dizziness weakness and poor appetite:Deconditioning/debility debility Difficult placement: Suspect her presentation is due to metastatic cancer . Cont on Decadron, supportive care, PT OT who have recommending SNF. Is in the process of being faxed out for skilled facility  Stage IV metastatic lung cancer Brain mets with left-sided upper plegia left-sided lower [place] Underwent MRI, noncontrast CT, seen by oncology, recommendation for medical treatment at this time neuro neurosurgery evaluated-offered surgical management if patient desires, pt declined.  Started whole brain radiation therapy for palliation,  Rad onc consulted-started XRT12/27/22-continued through 04/01/21.   Continue Decadron, supportive care.  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 Continue Coreg.  Monitor weight  CKD stage IIIb renal function at baseline in twos. Anemia of chronic disease/malignancy hemoglobin is stable monitor  Hyperkalemia  had Lokelma 12/25.  Potassium slightly up 5.3 monitor, add Lokelma 5 daily, cont  renal diet   Polysubstance abuse history of cocaine use Alcohol abuse: Cont on folic acid thiamine and alcohol /cocaine cessation counseling  Severe malnutrition BMI 15 augment diet as tolerated. Nutrition Problem: Severe Malnutrition Etiology: chronic illness, cancer and cancer related treatments Signs/Symptoms: percent weight loss, energy intake < or equal to 75% for > or equal to 1 month, mild fat depletion, moderate muscle depletion Interventions: Ensure Enlive (each supplement provides 350kcal and 20 grams of protein), MVI    DVT prophylaxis: heparin injection 5,000 Units Start: 03/11/21 2200 Code Status:   Code Status: Full Code Family Communication: Called patient's niece [name] at phone number [phone] but no response  Status is: Inpatient  Remains inpatient appropriate because: Ongoing need for radiation therapy and unsafe disposition need for skilled nursing facility Disposition: Currently NOT medically stable for discharge. Anticipated Disposition: Refusing skilled nursing facility anticipate discharge home after completion of radiation-TOC on board and following   Objective: Vitals last 24 hrs: Vitals:   03/25/21 2118 03/26/21 0433 03/26/21 1452 03/26/21 1453  BP: (!) 166/103 124/85 (!) 147/85 (!) 147/85  Pulse: (!) 102 82 95 95  Resp: 18 18  16   Temp: 98.1 F (36.7 C) 97.6 F (36.4 C) 98.5 F (36.9 C) 98.5 F (36.9 C)  TempSrc: Oral Oral Oral Oral  SpO2: 98% 97% 97% 97%  Weight:      Height:       Weight change:   Intake/Output Summary (Last 24 hours) at 03/26/2021 1610 Last data filed at 03/26/2021 1455 Gross per  24 hour  Intake 840 ml  Output  900 ml  Net -60 ml    Net IO Since Admission: -2,851.66 mL [03/26/21 1610]   Physical Examination:  Intermittently confused Perseverates on wanting to work with therapy and needs a wheelchair Quite weak on the left side Moves right arm and right leg well but very densely plegic on the left upper extremity Slightly stronger left lower extremity Reflexes not tested   Medications reviewed:  Scheduled Meds:  carvedilol  6.25 mg Oral BID   Chlorhexidine Gluconate Cloth  6 each Topical Daily   dexamethasone  4 mg Oral Q8H   feeding supplement  237 mL Oral TID BM   ferrous sulfate  325 mg Oral BID WC   gabapentin  300 mg Oral QHS   heparin  5,000 Units Subcutaneous Q8H   melatonin  3 mg Oral QHS   multivitamin with minerals  1 tablet Oral Daily   nicotine  21 mg Transdermal Daily   pantoprazole  40 mg Oral BID   polyethylene glycol  17 g Oral BID   senna-docusate  1 tablet Oral BID   thiamine  100 mg Oral Daily   Continuous Infusions:  Diet Order             DIET SOFT Room service appropriate? Yes; Fluid consistency: Thin  Diet effective now                   Nutrition Problem: Severe Malnutrition Etiology: chronic illness, cancer and cancer related treatments Signs/Symptoms: percent weight loss, energy intake < or equal to 75% for > or equal to 1 month, mild fat depletion, moderate muscle depletion Interventions: Ensure Enlive (each supplement provides 350kcal and 20 grams of protein), MVI  Weight change:   Wt Readings from Last 3 Encounters:  03/12/21 43.8 kg  08/16/20 56.5 kg  07/31/20 54 kg     Consultants:see note  Procedures:see note Antimicrobials: Anti-infectives (From admission, onward)    None      Culture/Microbiology    Component Value Date/Time   SDES  08/15/2020 1136    Lung, Left Performed at [facility], [place] [address] [place], [place] [address]    SPECREQUEST  08/15/2020 1136    NONE Performed at [facility], [place] [address] [place], [place] [address]    CULT  08/15/2020 1136    NO GROWTH 3 DAYS Performed at [place] [address] [place], [place] [address]    REPTSTATUS 08/18/2020 FINAL 08/15/2020 1136    Other culture-see note  Unresulted Labs (From admission, onward)    None     Data Reviewed: I have personally reviewed following labs and imaging studies CBC: Recent Labs  Lab 03/20/21 0505  WBC 5.3  NEUTROABS 3.4  HGB 9.6*  HCT 29.8*  MCV 106.0*  PLT 678    Basic Metabolic Panel: Recent Labs  Lab 03/20/21 0505 03/21/21 0517 03/22/21 0551  NA 137 134* 135  K 5.5* 5.3* 5.1  CL 102 102 101  CO2 25 27 27   GLUCOSE 102* 126* 106*  BUN 86* 84* 81*  CREATININE 2.65* 2.58* 2.41*  CALCIUM 8.9 8.8* 8.8*    GFR: Estimated Creatinine Clearance: 17.6 mL/min (A) (by C-G formula based on SCr of 2.41 mg/dL (H)). Liver Function Tests: No results for input(s): AST, ALT, ALKPHOS, BILITOT, PROT, ALBUMIN in the last 168 hours.  No results for input(s): LIPASE, AMYLASE in the last 168 hours. No  results for input(s): AMMONIA in the last 168 hours. Coagulation Profile: No results for input(s): INR, PROTIME in the last 168 hours. Cardiac Enzymes: No results for input(s): CKTOTAL, CKMB, CKMBINDEX, TROPONINI in the last 168 hours. BNP (last 3 results) No results for input(s): PROBNP in the last 8760 hours. HbA1C: No results for input(s): HGBA1C in the last 72 hours. CBG: No results for input(s): GLUCAP in the last 168 hours. Lipid Profile: No results for input(s): CHOL, HDL, LDLCALC, TRIG, CHOLHDL, LDLDIRECT in the last 72 hours. Thyroid Function Tests: No results for input(s): TSH, T4TOTAL, FREET4, T3FREE, THYROIDAB in the last 72 hours. Anemia Panel: No results for input(s): VITAMINB12, FOLATE, FERRITIN, TIBC, IRON, RETICCTPCT in the last 72 hours. Sepsis Labs: No results for input(s): PROCALCITON, LATICACIDVEN in the last 168 hours.  No  results found for this or any previous visit (from the past 240 hour(s)).    Radiology Studies: No results found.   LOS: 15 days   Nita Sells, MD [facility]  03/26/2021, 4:10 PM

## 2021-03-26 NOTE — Progress Notes (Signed)
PT Cancellation Note  Patient Details Name: Brenda [name] MRN: [id number] DOB: 1960/10/22   Cancelled Treatment:    Reason Eval/Treat Not Completed: Other (comment) (Pt not cooperating for mobility. Depsite maximum encouragement and attempts at redirection pt unwilling to initiate/follow through on cues to move to EOB. Pt then stating "go bother someone else". OT/PT repositioned pt in bed.) Will follow up at later date/time as schedule allows.   Verner Mould, DPT Acute Rehabilitation Services Office [phone] Pager [phone]

## 2021-03-27 ENCOUNTER — Ambulatory Visit: Payer: Medicaid Other | Admitting: Internal Medicine

## 2021-03-27 ENCOUNTER — Other Ambulatory Visit: Payer: Medicaid Other

## 2021-03-27 ENCOUNTER — Inpatient Hospital Stay: Payer: Medicaid Other

## 2021-03-27 ENCOUNTER — Inpatient Hospital Stay: Payer: Medicaid Other | Admitting: Physician Assistant

## 2021-03-27 ENCOUNTER — Ambulatory Visit
Admit: 2021-03-27 | Discharge: 2021-03-27 | Disposition: A | Discharge status: Home / Self Care | Payer: Medicaid Other | Attending: Radiation Oncology | Admitting: Radiation Oncology

## 2021-03-27 ENCOUNTER — Telehealth: Payer: Self-pay | Admitting: Internal Medicine

## 2021-03-27 DIAGNOSIS — C7931 Secondary malignant neoplasm of brain: Secondary | ICD-10-CM | POA: Diagnosis not present

## 2021-03-27 DIAGNOSIS — C3490 Malignant neoplasm of unspecified part of unspecified bronchus or lung: Secondary | ICD-10-CM | POA: Diagnosis not present

## 2021-03-27 NOTE — Progress Notes (Signed)
Physical Therapy Treatment Patient Details Name: Torrence Branagan MRN: [id number] DOB: May 13, 1960 Today's Date: 03/27/2021   History of Present Illness 61 years old female adm with AMS/confusion and falls. 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  Large cystic mass in the right occipital parietal lobe with prior  hemorrhage and local mass-effect. Multiple additional metastatic  lesions as above".  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 Pt refused recommended SNF level rehab following last hospital admit.    PT Comments    Pt refused mobility, but was agreeable to BLE exercises in bed. Pt has difficulty following commands. She requires assistance for LLE exercises due to weakness and poor coordination.    Recommendations for follow up therapy are one component of a multi-disciplinary discharge planning process, led by the attending physician.  Recommendations may be updated based on patient status, additional functional criteria and insurance authorization.  Follow Up Recommendations  Skilled nursing-short term rehab (<3 hours/day)     Assistance Recommended at Discharge    Patient can return home with the following Two people to help with walking and/or transfers;A lot of help with bathing/dressing/bathroom;Assist for transportation;Direct supervision/assist for medications management;Assistance with cooking/housework   Equipment Recommendations  Wheelchair (measurements PT);Wheelchair cushion (measurements PT)    Recommendations for Other Services       Precautions / Restrictions Precautions Precautions: Fall Precaution Comments: left hemiparesis, left neglect, inattention     Mobility  Bed Mobility               General bed mobility comments: pt refused    Transfers                        Ambulation/Gait                    Stairs             Wheelchair Mobility    Modified Rankin (Stroke Patients Only)       Balance                                            Cognition Arousal/Alertness: Awake/alert Behavior During Therapy: Impulsive;Agitated (became agitated with therapists) Overall Cognitive Status: Impaired/Different from baseline Area of Impairment: Attention;Memory;Following commands;Safety/judgement;Awareness;Problem solving                       Following Commands: Follows one step commands inconsistently Safety/Judgement: Decreased awareness of safety;Decreased awareness of deficits Awareness: Intellectual Problem Solving: Requires verbal cues;Requires tactile cues;Decreased initiation;Difficulty sequencing          Exercises General Exercises - Lower Extremity Ankle Circles/Pumps: AAROM;Both;20 reps;Supine Quad Sets: AROM;Both;5 reps;Supine Short Arc Quad: AAROM;Left;20 reps;Supine Heel Slides: AAROM;20 reps;Supine;AROM;Other (comment);Both (AROM R, AAROM L) Hip ABduction/ADduction: AAROM;10 reps;Both;Supine    General Comments        Pertinent Vitals/Pain Faces Pain Scale: Hurts little more Pain Location: headache Pain Intervention(s): Patient requesting pain meds-RN notified;Monitored during session    Home Living                          Prior Function            PT Goals (current goals can now be  found in the care plan section) Acute Rehab PT Goals Patient Stated Goal: to improve walking PT Goal Formulation: With patient Time For Goal Achievement: 04/10/21 Potential to Achieve Goals: Fair Progress towards PT goals: Not progressing toward goals - comment    Frequency    Min 2X/week      PT Plan Current plan remains appropriate    Co-evaluation              AM-PAC PT "6 Clicks" Mobility   Outcome Measure  Help needed turning from your back to your side while in a flat bed without  using bedrails?: A Lot Help needed moving from lying on your back to sitting on the side of a flat bed without using bedrails?: A Lot Help needed moving to and from a bed to a chair (including a wheelchair)?: Total Help needed standing up from a chair using your arms (e.g., wheelchair or bedside chair)?: Total Help needed to walk in hospital room?: Total Help needed climbing 3-5 steps with a railing? : Total 6 Click Score: 8    End of Session   Activity Tolerance: Patient tolerated treatment well;No increased pain Patient left: in bed;with call bell/phone within reach;with bed alarm set Nurse Communication: Mobility status PT Visit Diagnosis: Difficulty in walking, not elsewhere classified (R26.2);History of falling (Z91.81)     Time: 8916-9450 PT Time Calculation (min) (ACUTE ONLY): 10 min  Charges:  $Therapeutic Exercise: 8-22 mins                    Blondell Reveal Kistler PT 03/27/2021  Acute Rehabilitation Services Pager [phone] Office [phone]

## 2021-03-27 NOTE — Progress Notes (Signed)
PROGRESS NOTE    Zarinah Oviatt  BPZ:[id number] DOB: November 25, 1960 DOA: 03/11/2021 PCP: Merryl Hacker, No   Chief Complaint  Patient presents with   [facility]: [name], 61 y.o. female  stage IV T2A N2 M1B NSCLC lung CA used to follow-up with Dr. 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   Patient presented 03/11/2021 secondary to recurrent falls likely secondary to known metastatic lung cancer with brain metastasis. Decadron started. PT ordered. Palliative care consulted. Patient is being managed with steroids and radiation therapy and followed by palliative care    Subjective:  Awake -can tell me she needs to goto XRT Otherwise cannot tell me time /year  Can name this as [place] Overall unchanged from prior assesement  Assessment & Plan:  Recurrent falls with associated headache dizziness weakness and poor appetite:Deconditioning/debility debility Difficult placement: Suspect her presentation is due to metastatic cancer . Cont on Decadron, supportive care, PT OT who have recommending SNF. Is in the process of being faxed out for skilled facility--might be hard to place  Stage IV metastatic lung cancer Brain mets with left-sided upper plegia left-sided lower [place] Underwent MRI, noncontrast CT, seen by oncology, recommendation for medical treatment at this time neuro neurosurgery evaluated-offered surgical management if patient desires, pt declined.  Started whole brain radiation therapy for palliation,  Rad onc consulted-started XRT12/27/22-continued through 04/01/21.   Continue Decadron, supportive care.  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 Continue Coreg 6.25 bid.  Monitor weight  CKD stage IIIb renal function at baseline in twos.  Anemia of chronic disease/malignancy hemoglobin is stable monitor  Hyperkalemia  had Lokelma 12/25.  Potassium slightly up 5.3 monitor, add Lokelma 5 daily, cont  renal diet   Polysubstance abuse history of cocaine use Alcohol abuse: Cont on folic acid thiamine and alcohol /cocaine cessation counseling  Severe malnutrition BMI 15 augment diet as tolerated. Nutrition Problem: Severe Malnutrition Etiology: chronic illness, cancer and cancer related treatments Signs/Symptoms: percent weight loss, energy intake < or equal to 75% for > or equal to 1 month, mild fat depletion, moderate muscle depletion Interventions: Ensure Enlive (each supplement provides 350kcal and 20 grams of protein), MVI    DVT prophylaxis: heparin injection 5,000 Units Start: 03/11/21 2200 Code Status:   Code Status: Full Code Family Communication: Called patient's niece [name] at phone number [phone] but no response on 1/4 Status is: Inpatient  Remains inpatient appropriate because: Ongoing need for radiation therapy and unsafe disposition need for skilled nursing facility Disposition: Currently NOT medically stable for discharge. Anticipated Disposition: skilled nursing facility -TOC on board and following   Objective: Vitals last 24 hrs: Vitals:   03/26/21 1453 03/26/21 2026 03/27/21 0610 03/27/21 1334  BP: (!) 147/85 138/87 (!) 141/91 124/82  Pulse: 95 93 79 95  Resp: 16 18 16 17   Temp: 98.5 F (36.9 C) 98.3 F (36.8 C) 97.8 F (36.6 C) 98 F (36.7 C)  TempSrc: Oral Oral Oral   SpO2: 97% 97% 99% 98%  Weight:      Height:       Weight change:   Intake/Output Summary (Last 24 hours)  at 03/27/2021 1550 Last data filed at 03/27/2021 1300 Gross per 24 hour  Intake 720 ml   Output 900 ml  Net -180 ml    Net IO Since Admission: -3,031.66 mL [03/27/21 1550]   Physical Examination:  Intermittently confused-otherwise fair Remains almost plegic on LUE Moves right arm and right leg well y Slightly stronger left lower extremity Reflexes not tested   Medications reviewed:  Scheduled Meds:  carvedilol  6.25 mg Oral BID   Chlorhexidine Gluconate Cloth  6 each Topical Daily   dexamethasone  4 mg Oral Q8H   feeding supplement  237 mL Oral TID BM   ferrous sulfate  325 mg Oral BID WC   gabapentin  300 mg Oral QHS   heparin  5,000 Units Subcutaneous Q8H   melatonin  3 mg Oral QHS   multivitamin with minerals  1 tablet Oral Daily   nicotine  21 mg Transdermal Daily   pantoprazole  40 mg Oral BID   polyethylene glycol  17 g Oral BID   senna-docusate  1 tablet Oral BID   thiamine  100 mg Oral Daily   Continuous Infusions:  Diet Order             DIET SOFT Room service appropriate? Yes; Fluid consistency: Thin  Diet effective now                   Nutrition Problem: Severe Malnutrition Etiology: chronic illness, cancer and cancer related treatments Signs/Symptoms: percent weight loss, energy intake < or equal to 75% for > or equal to 1 month, mild fat depletion, moderate muscle depletion Interventions: Ensure Enlive (each supplement provides 350kcal and 20 grams of protein), MVI  Weight change:   Wt Readings from Last 3 Encounters:  03/12/21 43.8 kg  08/16/20 56.5 kg  07/31/20 54 kg     Consultants:see note  Procedures:see note Antimicrobials: Anti-infectives (From admission, onward)    None      Culture/Microbiology    Component Value Date/Time   SDES  08/15/2020 1136    Lung, Left Performed at [facility], [place] [address] [place], [place] [address]    SPECREQUEST  08/15/2020 1136    NONE Performed at [facility], [place] [address], [place], [place] [address]    CULT  08/15/2020 1136     NO GROWTH 3 DAYS Performed at [facility] [address], [place], [place] [address]    REPTSTATUS 08/18/2020 FINAL 08/15/2020 1136    Other culture-see note  Unresulted Labs (From admission, onward)    None     Data Reviewed: I have personally reviewed following labs and imaging studies CBC: No results for input(s): WBC, NEUTROABS, HGB, HCT, MCV, PLT in the last 168 hours.  Basic Metabolic Panel: Recent Labs  Lab 03/21/21 0517 03/22/21 0551  NA 134* 135  K 5.3* 5.1  CL 102 101  CO2 27 27  GLUCOSE 126* 106*  BUN 84* 81*  CREATININE 2.58* 2.41*  CALCIUM 8.8* 8.8*    GFR: Estimated Creatinine Clearance: 17.6 mL/min (A) (by C-G formula based on SCr of 2.41 mg/dL (H)). Liver Function Tests: No results for input(s): AST, ALT, ALKPHOS, BILITOT, PROT, ALBUMIN in the last 168 hours.  No results for input(s): LIPASE, AMYLASE in the last 168 hours. No results for input(s): AMMONIA in the last 168 hours. Coagulation Profile: No results for input(s): INR, PROTIME in the last 168 hours. Cardiac Enzymes: No results for input(s): CKTOTAL,  CKMB, CKMBINDEX, TROPONINI in the last 168 hours. BNP (last 3 results) No results for input(s): PROBNP in the last 8760 hours. HbA1C: No results for input(s): HGBA1C in the last 72 hours. CBG: No results for input(s): GLUCAP in the last 168 hours. Lipid Profile: No results for input(s): CHOL, HDL, LDLCALC, TRIG, CHOLHDL, LDLDIRECT in the last 72 hours. Thyroid Function Tests: No results for input(s): TSH, T4TOTAL, FREET4, T3FREE, THYROIDAB in the last 72 hours. Anemia Panel: No results for input(s): VITAMINB12, FOLATE, FERRITIN, TIBC, IRON, RETICCTPCT in the last 72 hours. Sepsis Labs: No results for input(s): PROCALCITON, LATICACIDVEN in the last 168 hours.  No results found for this or any previous visit (from the past 240 hour(s)).    Radiology Studies: No results found.   LOS: 16 days   Nita Sells, MD [facility]  03/27/2021, 3:50 PM

## 2021-03-27 NOTE — Telephone Encounter (Signed)
Rescheduled 01/05 appointment due to patient being hospitalized. Called and left a voicemail of new rescheduled appointment.

## 2021-03-27 NOTE — TOC Progression Note (Signed)
Transition of Care [facility] - Progression Note    Patient Details  Name: Brenda [name] MRN: [id number] Date of Birth: 1960-06-28  Transition of [facility]) CM/SW Contact  Mang Hazelrigg, Marjie Skiff, RN Phone Number: 03/27/2021, 2:36 PM  Clinical Narrative:    Pt has no SNF bed offers. Reached out to [facility] to place pt on Difficult to Place list. Will reach out to several SNF facilities today to encourage bed offers.   Expected Discharge Plan: Skilled Nursing Facility Barriers to Discharge: Continued Medical Work up  Expected Discharge Plan and Services Expected Discharge Plan: [place]   Discharge Planning Services: CM Consult   Living arrangements for the past 2 months: Single Family Home                           HH Arranged: PT, OT [facility] Agency: [place] Date [facility] Agency Contacted: 03/14/21 Time [place]: 1139 Representative spoke with at [place]: [place] ([place]) Interventions    Readmission Risk Interventions Readmission Risk Prevention Plan 03/14/2021 07/17/2020 04/11/2020  Transportation Screening Complete Complete Complete  PCP or Specialist Appt within 3-5 Days - - Complete  HRI or [place] - Complete Complete  Social Work Consult for [place] Planning/Counseling - Complete Complete  Palliative Care Screening - Not Applicable Not Applicable  Medication Review Press photographer) Complete Complete Complete  PCP or Specialist appointment within 3-5 days of discharge Complete - -  [place] or Home Care Consult Complete - -  SW Recovery Care/Counseling Consult Complete - -  Palliative Care Screening Complete - -  [place] Patient Refused - -  Some recent data might be hidden

## 2021-03-28 ENCOUNTER — Ambulatory Visit
Admit: 2021-03-28 | Discharge: 2021-03-28 | Disposition: A | Discharge status: Home / Self Care | Payer: Medicaid Other | Attending: Radiation Oncology | Admitting: Radiation Oncology

## 2021-03-28 DIAGNOSIS — C3490 Malignant neoplasm of unspecified part of unspecified bronchus or lung: Secondary | ICD-10-CM | POA: Diagnosis not present

## 2021-03-28 DIAGNOSIS — C7931 Secondary malignant neoplasm of brain: Secondary | ICD-10-CM | POA: Diagnosis not present

## 2021-03-28 NOTE — Progress Notes (Signed)
Occupational Therapy Evaluation Patient Details Name: Brenda [name] MRN: [id number] DOB: 03-12-1961 Today's Date: 03/28/2021   History of Present Illness 61 years old female adm with AMS/confusion and falls. 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  Large cystic mass in the right occipital parietal lobe with prior  hemorrhage and local mass-effect. Multiple additional metastatic  lesions as above".  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 Pt refused recommended SNF level rehab following last hospital admit.   Clinical Impression   Chart reviewed, pt greeted in bed. Pt is oriented to self only, however becomes oriented to place and situation with frequent cueing. Pt reports "I don't care what date it is". Re-eval completed on this date. Per chart review pt has demonstrated an increase in resistance to OOB task/general therapy participation. On this date pt is agreeable to ADL tasks in long sit, refuses OOB activity. LUE noted with a sublux, pt educated on AAROM, positioning for affected LUE. Goals have been downgraded as pt is requiring an increased amount of assist in all ADL tasks/cognition compared to evaluation. Improved participation on this date as compared to previous tx session, however. Continue to recommend STR if pt continues to participate in therapy. Pt is left as received, NAD, all needs met. RN messaged re: pt requested for pain meds. OT will continue to follow while admitted.      Recommendations for follow up therapy are one component of a multi-disciplinary discharge planning process, led by the attending physician.  Recommendations may be updated based on patient status, additional functional criteria and insurance authorization.   Follow Up Recommendations  Skilled nursing-short term rehab (<3 hours/day)    Assistance Recommended  at Discharge Frequent or constant Supervision/Assistance  Patient can return home with the following Two people to help with walking and/or transfers;Two people to help with bathing/dressing/bathroom;Direct supervision/assist for medications management;Assist for transportation;Assistance with cooking/housework;Direct supervision/assist for financial management    Functional Status Assessment     Equipment Recommendations       Recommendations for Other Services       Precautions / Restrictions Precautions Precautions: Fall Precaution Comments: left hemiparesis, left neglect, inattention Restrictions Weight Bearing Restrictions: No      Mobility Bed Mobility Overal bed mobility: Needs Assistance Bed Mobility: Rolling (supine to long sit with MOD A, heavy use of bedrail with RUE) Rolling: Min assist         General bed mobility comments: pt refused supine<>sit    Transfers                   General transfer comment: pt refused      Balance Overall balance assessment: History of Falls;Needs assistance     Sitting balance - Comments: pt in long sit required average MIN-MOD A for support in long sit. Able to sit without support for approx 45 seconds on 1 attempt.                                   ADL either performed or assessed with clinical judgement   ADL Overall ADL's : Needs assistance/impaired     Grooming: Sitting;Wash/dry hands;Wash/dry face;Applying deodorant;Minimal assistance;Maximal assistance Grooming Details (indicate cue type and reason): MIN A for washing hands/face in long sit with increased rest breaks provided; MAX A for deoderant application in long  sit; Upper Body Bathing: Minimal assistance;Sitting;Cueing for sequencing Upper Body Bathing Details (indicate cue type and reason): MIN A in long sit with rest breaks provided Lower Body Bathing: Sitting/lateral leans;Minimal assistance Lower Body Bathing Details (indicate cue  type and reason): in long sit Upper Body Dressing : Minimal assistance;Sitting Upper Body Dressing Details (indicate cue type and reason): gown in long sit                 Functional mobility during ADLs: Minimal assistance;Cueing for safety;Cueing for sequencing General ADL Comments: pt declined OOB or edge of bed activity despite multi modal cueing. Agreeable to bed level tasks however pt educated on importance of OOB, progressing mobility to preserve level of function. Pt agreeable to completing tasks in long sit. Pt able to sustain long sit for up to 45 seconds before requiring rest break. Participated in task in long sit for approx 18 minutes.     Vision Baseline Vision/History: 1 Wears glasses       Perception     Praxis      Pertinent Vitals/Pain Pain Assessment: Faces Faces Pain Scale: Hurts a little bit Pain Location: [place] Pain Descriptors / Indicators: Grimacing;Headache Pain Intervention(s): Limited activity within patient's tolerance;Patient requesting pain meds-RN notified;Monitored during session;Repositioned     Hand Dominance     Extremity/Trunk Assessment             Communication     Cognition Arousal/Alertness: Awake/alert Behavior During Therapy: Impulsive Overall Cognitive Status: Impaired/Different from baseline Area of Impairment: Attention;Memory;Following commands;Safety/judgement;Awareness;Problem solving                 Orientation Level: Disoriented to;Place;Time;Situation Current Attention Level: Focused Memory: Decreased recall of precautions;Decreased short-term memory Following Commands: Follows one step commands inconsistently Safety/Judgement: Decreased awareness of safety;Decreased awareness of deficits Awareness: Intellectual Problem Solving: Requires verbal cues;Requires tactile cues;Decreased initiation;Difficulty sequencing;Slow processing General Comments: continued less impulsivity noted; approrpiate  participation in task with multi modal cueing     General Comments       Exercises     Shoulder Instructions      Home Living                                          Prior Functioning/Environment                          OT Problem List:        OT Treatment/Interventions:      OT Goals(Current goals can be found in the care plan section) ADL Goals Pt Will Perform Lower Body Dressing: with min guard assist;sitting/lateral leans Pt Will Transfer to Toilet: with min assist Pt Will Perform Toileting - Clothing Manipulation and hygiene: sitting/lateral leans;with min assist Additional ADL Goal #1: Pt will improve cognition and orientation by demonstrating she is alert and oriented x4 with no vcs required  OT Frequency: Min 2X/week    Co-evaluation              AM-PAC OT "6 Clicks" Daily Activity     Outcome Measure Help from another person eating meals?: A Little Help from another person taking care of personal grooming?: A Little Help from another person toileting, which includes using toliet, bedpan, or urinal?: A Lot Help from another person bathing (including washing, rinsing, drying)?: A Lot Help from another person to put  on and taking off regular upper body clothing?: A Lot Help from another person to put on and taking off regular lower body clothing?: A Lot 6 Click Score: 14   End of Session Nurse Communication: Mobility status  Activity Tolerance: Patient tolerated treatment well Patient left: in bed;with call bell/phone within reach;with bed alarm set  OT Visit Diagnosis: Unsteadiness on feet (R26.81);Hemiplegia and hemiparesis;History of falling (Z91.81);Repeated falls (R29.6);Muscle weakness (generalized) (M62.81);Other symptoms and signs involving cognitive function;Apraxia (R48.2);Other symptoms and signs involving the nervous system (R29.898) Hemiplegia - Right/Left: Left Hemiplegia - dominant/non-dominant:  Non-Dominant Hemiplegia - caused by: Unspecified                Time: 5747-3403 OT Time Calculation (min): 24 min Charges:  OT General Charges $OT Visit: 1 Visit OT Evaluation $OT Re-eval: 1 Re-eval OT Treatments $Self Care/Home Management : 23-37 mins  Shanon Payor, OTD OTR/L  03/28/21, 3:47 PM

## 2021-03-28 NOTE — Progress Notes (Signed)
PROGRESS NOTE    Brenda [name]  LYY:[id number] DOB: December 15, 1960 DOA: 03/11/2021 PCP: Merryl Hacker, No   Chief Complaint  Patient presents with   [facility] Course: Brenda [name], 61 y.o. female  stage IV T2A N2 M1B NSCLC lung CA used to follow-up with Dr. 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   Patient presented 03/11/2021 secondary to recurrent falls likely secondary to known metastatic lung cancer with brain metastasis. Decadron started. PT ordered. Palliative care consulted. Patient is being managed with steroids and radiation therapy and followed by palliative care    Subjective: Awake sometimes having disorientation however according to nursing No spontaneous complaints  Assessment & Plan:  Recurrent falls with associated headache dizziness weakness and poor appetite:Deconditioning/debility debility Difficult placement: Suspect her presentation is due to metastatic cancer . Cont on Decadron, supportive care, PT OT who have recommending SNF. 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  Started whole brain radiation therapy for palliation,  Rad onc consulted-started XRT12/27/22-continued through 04/01/21.   Continue Decadron, supportive care.  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 Continue Coreg 6.25 bid.  Monitor weight  CKD stage IIIb renal function at baseline in twos.  Anemia of chronic disease/malignancy hemoglobin is stable monitor  Hyperkalemia  had Lokelma 12/25.  Potassium slightly up 5.3 monitor, add Lokelma 5 daily, cont  renal diet Repeat periodic labs   Polysubstance abuse history of cocaine use Alcohol abuse: Cont on folic acid thiamine and alcohol /cocaine cessation counseling  Severe malnutrition BMI 15 augment diet as tolerated. Nutrition Problem: Severe Malnutrition Etiology: chronic illness, cancer and cancer related treatments Signs/Symptoms: percent weight loss, energy intake < or equal to 75% for > or equal to 1 month, mild fat depletion, moderate muscle depletion Interventions: Ensure Enlive (each supplement provides 350kcal and 20 grams of protein), MVI    DVT prophylaxis: heparin injection 5,000 Units Start: 03/11/21 2200 Code Status:   Code Status: Full Code Family Communication: Called patient's niece [name] at phone number [phone] but no response on 1/4 Status is: Inpatient  Remains inpatient appropriate because: Ongoing need for radiation therapy and unsafe disposition need for skilled nursing facility Disposition: Currently NOT medically stable for discharge. Anticipated Disposition: skilled nursing facility -TOC on board and following   Objective: Vitals last 24 hrs: Vitals:   03/27/21 1334 03/27/21 2036 03/27/21 2252 03/28/21 0449  BP: 124/82 136/87 (!) 163/96 113/75  Pulse: 95 96 99 81  Resp: 17 16  18   Temp: 98 F (36.7 C) 98.1 F (36.7 C)  97.8 F (36.6 C)  TempSrc:  Oral  Oral  SpO2: 98% 95%  95%  Weight:      Height:       Weight change:   Intake/Output Summary (Last 24 hours) at 03/28/2021 1219 Last data filed at 03/28/2021 0751 Gross per 24  hour  Intake 696 ml  Output --  Net 696 ml    Net  IO Since Admission: -2,575.66 mL [03/28/21 1219]   Physical Examination:  Intermittently confused-no overall changes from prior Remains almost plegic on LUE and this is unchanged Moves right arm and right leg well y Slightly stronger left lower extremity Reflexes not tested   Medications reviewed:  Scheduled Meds:  carvedilol  6.25 mg Oral BID   Chlorhexidine Gluconate Cloth  6 each Topical Daily   dexamethasone  4 mg Oral Q8H   feeding supplement  237 mL Oral TID BM   ferrous sulfate  325 mg Oral BID WC   gabapentin  300 mg Oral QHS   heparin  5,000 Units Subcutaneous Q8H   melatonin  3 mg Oral QHS   multivitamin with minerals  1 tablet Oral Daily   nicotine  21 mg Transdermal Daily   pantoprazole  40 mg Oral BID   polyethylene glycol  17 g Oral BID   senna-docusate  1 tablet Oral BID   thiamine  100 mg Oral Daily   Continuous Infusions:  Diet Order             DIET SOFT Room service appropriate? Yes; Fluid consistency: Thin  Diet effective now                   Nutrition Problem: Severe Malnutrition Etiology: chronic illness, cancer and cancer related treatments Signs/Symptoms: percent weight loss, energy intake < or equal to 75% for > or equal to 1 month, mild fat depletion, moderate muscle depletion Interventions: Ensure Enlive (each supplement provides 350kcal and 20 grams of protein), MVI  Weight change:   Wt Readings from Last 3 Encounters:  03/12/21 43.8 kg  08/16/20 56.5 kg  07/31/20 54 kg     Consultants:see note  Procedures:see note Antimicrobials: Anti-infectives (From admission, onward)    None      Culture/Microbiology    Component Value Date/Time   SDES  08/15/2020 1136    Lung, Left Performed at [facility], [place] [address], [place], [place] [address]    SPECREQUEST  08/15/2020 1136    NONE Performed at [facility], [place] [address], [place], [place] [address]    CULT  08/15/2020 1136    NO  GROWTH 3 DAYS Performed at [place] [address], [place], [place] [address]    REPTSTATUS 08/18/2020 FINAL 08/15/2020 1136    Other culture-see note  Unresulted Labs (From admission, onward)    None     Data Reviewed: I have personally reviewed following labs and imaging studies CBC: No results for input(s): WBC, NEUTROABS, HGB, HCT, MCV, PLT in the last 168 hours.  Basic Metabolic Panel: Recent Labs  Lab 03/22/21 0551  NA 135  K 5.1  CL 101  CO2 27  GLUCOSE 106*  BUN 81*  CREATININE 2.41*  CALCIUM 8.8*    GFR: Estimated Creatinine Clearance: 17.6 mL/min (A) (by C-G formula based on SCr of 2.41 mg/dL (H)). Liver Function Tests: No results for input(s): AST, ALT, ALKPHOS, BILITOT, PROT, ALBUMIN in the last 168 hours.  No results for input(s): LIPASE, AMYLASE in the last 168 hours. No results for input(s): AMMONIA in the last 168 hours. Coagulation Profile: No results for input(s): INR, PROTIME in the last 168 hours. Cardiac Enzymes: No results for input(s): CKTOTAL, CKMB, CKMBINDEX, TROPONINI in the last 168 hours. BNP (last 3 results) No results for input(s):  PROBNP in the last 8760 hours. HbA1C: No results for input(s): HGBA1C in the last 72 hours. CBG: No results for input(s): GLUCAP in the last 168 hours. Lipid Profile: No results for input(s): CHOL, HDL, LDLCALC, TRIG, CHOLHDL, LDLDIRECT in the last 72 hours. Thyroid Function Tests: No results for input(s): TSH, T4TOTAL, FREET4, T3FREE, THYROIDAB in the last 72 hours. Anemia Panel: No results for input(s): VITAMINB12, FOLATE, FERRITIN, TIBC, IRON, RETICCTPCT in the last 72 hours. Sepsis Labs: No results for input(s): PROCALCITON, LATICACIDVEN in the last 168 hours.  No results found for this or any previous visit (from the past 240 hour(s)).    Radiology Studies: No results found.   LOS: 17 days   Nita Sells, MD [facility]  03/28/2021, 12:19 PM

## 2021-03-29 DIAGNOSIS — C3490 Malignant neoplasm of unspecified part of unspecified bronchus or lung: Secondary | ICD-10-CM | POA: Diagnosis not present

## 2021-03-29 DIAGNOSIS — C7931 Secondary malignant neoplasm of brain: Secondary | ICD-10-CM | POA: Diagnosis not present

## 2021-03-29 LAB — CBC WITH DIFFERENTIAL/PLATELET
Abs Immature Granulocytes: 0.17 10*3/uL — ABNORMAL HIGH (ref 0.00–0.07)
Basophils Absolute: 0 10*3/uL (ref 0.0–0.1)
Basophils Relative: 0 %
Eosinophils Absolute: 0 10*3/uL (ref 0.0–0.5)
Eosinophils Relative: 0 %
HCT: 27.6 % — ABNORMAL LOW (ref 36.0–46.0)
Hemoglobin: 8.9 g/dL — ABNORMAL LOW (ref 12.0–15.0)
Immature Granulocytes: 3 %
Lymphocytes Relative: 11 %
Lymphs Abs: 0.6 10*3/uL — ABNORMAL LOW (ref 0.7–4.0)
MCH: 34.1 pg — ABNORMAL HIGH (ref 26.0–34.0)
MCHC: 32.2 g/dL (ref 30.0–36.0)
MCV: 105.7 fL — ABNORMAL HIGH (ref 80.0–100.0)
Monocytes Absolute: 0.6 10*3/uL (ref 0.1–1.0)
Monocytes Relative: 10 %
Neutro Abs: 4.2 10*3/uL (ref 1.7–7.7)
Neutrophils Relative %: 76 %
Platelets: 186 10*3/uL (ref 150–400)
RBC: 2.61 MIL/uL — ABNORMAL LOW (ref 3.87–5.11)
RDW: 15.7 % — ABNORMAL HIGH (ref 11.5–15.5)
WBC: 5.6 10*3/uL (ref 4.0–10.5)
nRBC: 0 % (ref 0.0–0.2)

## 2021-03-29 LAB — BASIC METABOLIC PANEL
Anion gap: 8 (ref 5–15)
BUN: 87 mg/dL — ABNORMAL HIGH (ref 6–20)
CO2: 25 mmol/L (ref 22–32)
Calcium: 8.8 mg/dL — ABNORMAL LOW (ref 8.9–10.3)
Chloride: 104 mmol/L (ref 98–111)
Creatinine, Ser: 2.48 mg/dL — ABNORMAL HIGH (ref 0.44–1.00)
GFR, Estimated: 22 mL/min — ABNORMAL LOW (ref >60)
Glucose, Bld: 113 mg/dL — ABNORMAL HIGH (ref 70–99)
Potassium: 5.4 mmol/L — ABNORMAL HIGH (ref 3.5–5.1)
Sodium: 137 mmol/L (ref 135–145)

## 2021-03-29 MED ORDER — SODIUM ZIRCONIUM CYCLOSILICATE 5 G PO PACK
5.0000 g | PACK | Freq: Every day | ORAL | Status: DC
  Administered 2021-03-29 – 2021-04-03 (×6): 5 g via ORAL
  Filled 2021-03-29 (×6): qty 1

## 2021-03-29 NOTE — Progress Notes (Signed)
PROGRESS NOTE    Brenda [name]  DDU:[id number] DOB: 1960-12-25 DOA: 03/11/2021 PCP: Merryl Hacker, No   Chief Complaint  Patient presents with   Brenda [name] Course: [name], 61 y.o. female  stage IV T2A N2 M1B NSCLC lung CA used to follow-up with Dr. 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   Patient presented 03/11/2021 secondary to recurrent falls likely secondary to known metastatic lung cancer with brain metastasis. Decadron started. PT ordered. Palliative care consulted. Patient is being managed with steroids and radiation therapy and followed by palliative care    Subjective: Awake no distress.  Seems comfortable Still weak in bed  Assessment & Plan:  Recurrent falls with associated headache dizziness weakness and poor appetite:Deconditioning/debility debility Difficult placement: Suspect her presentation is due to metastatic cancer . Cont on Decadron, supportive care, PT OT who have recommending SNF. 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  Started whole brain radiation therapy for palliation,  Rad onc consulted-started XRT12/27/22-continued through 04/01/21.   Continue Decadron, supportive care.  Continue pain control  Left pleural effusion in the setting of stage IV lung cancer  doing well on room air  Chronic  combined systolic and diastolic CHF with EF 23-76% G1 DD.remains euvolemic. Continue Coreg 6.25 bid.  Monitor weight  CKD stage IIIb renal function at baseline in twos.  Anemia of chronic disease/malignancy hemoglobin is stable monitor  Hyperkalemia  had Lokelma 12/25.   Potassium slightly up 5.3 monitor, re-add Lokelma 5 daily, cont  renal diet Repeat periodic labs   Polysubstance abuse history of cocaine use Alcohol abuse: Cont on folic acid thiamine and alcohol /cocaine cessation counseling  Severe malnutrition BMI 15 augment diet as tolerated. Nutrition Problem: Severe Malnutrition Etiology: chronic illness, cancer and cancer related treatments Signs/Symptoms: percent weight loss, energy intake < or equal to 75% for > or equal to 1 month, mild fat depletion, moderate muscle depletion Interventions: Ensure Enlive (each supplement provides 350kcal and 20 grams of protein), MVI    DVT prophylaxis: heparin injection 5,000 Units Start: 03/11/21 2200 Code Status:   Code Status: Full Code Family Communication: Called patient's Son at phone number [phone] Status is: Inpatient  Remains inpatient appropriate because: Ongoing need for radiation therapy and unsafe disposition need for skilled nursing facility Disposition: Currently NOT medically stable for discharge. Anticipated Disposition: skilled nursing facility -TOC on board and following   Objective: Vitals last 24 hrs: Vitals:   03/28/21 2123 03/28/21 2145 03/29/21 0503 03/29/21 1600  BP: (!) 150/89  135/89 131/84  Pulse: (!) 102 95 81 (!) 105  Resp:  18 14 16   Temp:  98 F (36.7 C) 97.8 F (36.6 C) 98.9 F (37.2 C)  TempSrc:  Oral Oral Oral  SpO2:  99% 100% 96%  Weight:      Height:       Weight change:   Intake/Output Summary (Last 24 hours) at 03/29/2021 1642 Last data filed at 03/29/2021 0517 Gross per 24 hour  Intake --  Output 1550 ml  Net -1550 ml    Net IO Since Admission: -4,125.66 mL [03/29/21 1642]    Physical Examination:  no overall changes from prior Remains almost plegic on LUE and this is unchanged Moves right arm and right leg well y Slightly stronger left lower extremity Reflexes not tested   Medications reviewed:  Scheduled Meds:  carvedilol  6.25 mg Oral BID   Chlorhexidine Gluconate Cloth  6 each Topical Daily   dexamethasone  4 mg Oral Q8H   feeding supplement  237 mL Oral TID BM   ferrous sulfate  325 mg Oral BID WC   gabapentin  300 mg Oral QHS   heparin  5,000 Units Subcutaneous Q8H   melatonin  3 mg Oral QHS   multivitamin with minerals  1 tablet Oral Daily   nicotine  21 mg Transdermal Daily   pantoprazole  40 mg Oral BID   polyethylene glycol  17 g Oral BID   senna-docusate  1 tablet Oral BID   thiamine  100 mg Oral Daily   Continuous Infusions:  Diet Order             DIET SOFT Room service appropriate? Yes; Fluid consistency: Thin  Diet effective now                   Nutrition Problem: Severe Malnutrition Etiology: chronic illness, cancer and cancer related treatments Signs/Symptoms: percent weight loss, energy intake < or equal to 75% for > or equal to 1 month, mild fat depletion, moderate muscle depletion Interventions: Ensure Enlive (each supplement provides 350kcal and 20 grams of protein), MVI  Weight change:   Wt Readings from Last 3 Encounters:  03/12/21 43.8 kg  08/16/20 56.5 kg  07/31/20 54 kg     Consultants:see note  Procedures:see note Antimicrobials: Anti-infectives (From admission, onward)    None      Culture/Microbiology    Component Value Date/Time   SDES  08/15/2020 1136    Lung, Left Performed at [facility], [place] [address] [place], [place] [address]    SPECREQUEST  08/15/2020 1136    NONE Performed at [facility], [place] [address], [place], [place] [address]    CULT  08/15/2020 1136    NO GROWTH 3 DAYS Performed at [place] [address]  [place], [place] [address]    REPTSTATUS 08/18/2020 FINAL 08/15/2020 1136    Other culture-see note  Unresulted Labs (From admission, onward)    None     Data Reviewed: I have personally reviewed following labs and imaging studies CBC: Recent Labs  Lab 03/29/21 0614  WBC 5.6  NEUTROABS 4.2  HGB 8.9*  HCT 27.6*  MCV 105.7*  PLT 712    Basic Metabolic Panel: Recent Labs  Lab 03/29/21 0614  NA 137  K 5.4*  CL 104  CO2 25  GLUCOSE 113*  BUN 87*  CREATININE 2.48*  CALCIUM 8.8*    GFR: Estimated Creatinine Clearance: 17.1 mL/min (A) (by C-G formula based on SCr of 2.48 mg/dL (H)). Liver Function Tests: No results for input(s): AST, ALT, ALKPHOS, BILITOT, PROT, ALBUMIN in the last 168 hours.  No results for input(s): LIPASE, AMYLASE in the last 168 hours. No results for input(s): AMMONIA in the last 168 hours. Coagulation Profile: No results for input(s): INR, PROTIME in the last 168 hours. Cardiac Enzymes: No results for input(s): CKTOTAL, CKMB, CKMBINDEX, TROPONINI in the last 168 hours. BNP (last 3  results) No results for input(s): PROBNP in the last 8760 hours. HbA1C: No results for input(s): HGBA1C in the last 72 hours. CBG: No results for input(s): GLUCAP in the last 168 hours. Lipid Profile: No results for input(s): CHOL, HDL, LDLCALC, TRIG, CHOLHDL, LDLDIRECT in the last 72 hours. Thyroid Function Tests: No results for input(s): TSH, T4TOTAL, FREET4, T3FREE, THYROIDAB in the last 72 hours. Anemia Panel: No results for input(s): VITAMINB12, FOLATE, FERRITIN, TIBC, IRON, RETICCTPCT in the last 72 hours. Sepsis Labs: No results for input(s): PROCALCITON, LATICACIDVEN in the last 168 hours.  No results found for this or any previous visit (from the past 240 hour(s)).    Radiology Studies: No results found.   LOS: 18 days   Nita Sells, MD [facility]  03/29/2021, 4:42 PM

## 2021-03-29 NOTE — Progress Notes (Signed)
Brenda [name] called and stated he needs to know information on how to process what is needed to be done regarding his mother with advance directive/POA/palliative care, etc. He stated that he would be in today 03/29/2021 at 12:00 p.m., contact number [phone]  Informed patient that I would put in a note for chaplain to reach out to patient regarding end of life care forms and needs.

## 2021-03-29 NOTE — Progress Notes (Signed)
Chaplain met family member in the hall who was wondering about her plan to transfer out of the hospital. Chaplain notified the nurse who stated that there was not a firm plan until after 1/10 when she has her final radiation.  He was grateful for the information.  Kandice was working with a Film/video editor and was not available.  Chaplain acknowledges the consult to complete AD and will check in with patient on Monday.  If there are urgent needs over the weekend, please page [place] at [phone].  Ardath Sax Caci [place], Bcc [phone] 6:58 PM

## 2021-03-30 NOTE — Progress Notes (Signed)
Patient seen and examined coherent remembers that she did not have radiation today Otherwise eating and drinking fairly well I spoke with the son yesterday She remained stable for skilled facility when available  Verneita Griffes, MD [facility] 4:49 PM  No charge

## 2021-03-31 ENCOUNTER — Ambulatory Visit
Admit: 2021-03-31 | Discharge: 2021-03-31 | Disposition: A | Discharge status: Home / Self Care | Payer: Medicaid Other | Attending: Radiation Oncology | Admitting: Radiation Oncology

## 2021-03-31 DIAGNOSIS — C7931 Secondary malignant neoplasm of brain: Secondary | ICD-10-CM | POA: Diagnosis not present

## 2021-03-31 DIAGNOSIS — C3490 Malignant neoplasm of unspecified part of unspecified bronchus or lung: Secondary | ICD-10-CM | POA: Diagnosis not present

## 2021-03-31 NOTE — TOC Progression Note (Signed)
Transition of Care [facility]) - Progression Note    Patient Details  Name: Brenda [name] MRN: [id number] Date of Birth: February 15, 1961  Transition [facility] CM/SW Contact  Everlene Cunning, Marjie Skiff, RN Phone Number: 03/31/2021, 3:10 PM  Clinical Narrative:     Pt still has no SNF bed offers. FL2 faxed out to a wider range of locations.  Expected Discharge Plan: Skilled Nursing Facility Barriers to Discharge: Continued Medical Work up  Expected Discharge Plan and Services Expected Discharge Plan: [place]   Discharge Planning Services: CM Consult   Living arrangements for the past 2 months: Single Family Home                           HH Arranged: PT, OT [facility] Agency: [place] Date [facility] Agency Contacted: 03/14/21 Time [place]: 1139 Representative spoke with at [place]: [place] (Rafael Gonzalez) Interventions    Readmission Risk Interventions Readmission Risk Prevention Plan 03/14/2021 07/17/2020 04/11/2020  Transportation Screening Complete Complete Complete  PCP or Specialist Appt within 3-5 Days - - Complete  HRI or [place] - Complete Complete  Social Work Consult for [place] Planning/Counseling - Complete Complete  Palliative Care Screening - Not Applicable Not Applicable  Medication Review Press photographer) Complete Complete Complete  PCP or Specialist appointment within 3-5 days of discharge Complete - -  [place] or Home Care Consult Complete - -  SW Recovery Care/Counseling Consult Complete - -  Palliative Care Screening Complete - -  [place] Patient Refused - -  Some recent data might be hidden

## 2021-03-31 NOTE — Progress Notes (Addendum)
Physical Therapy Treatment Patient Details Name: Brenda [name] MRN: [id number] DOB: 22-Jan-1961 Today's Date: 03/31/2021   History of Present Illness 61 years old female adm with AMS/confusion and falls. 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  Large cystic mass in the right occipital parietal lobe with prior  hemorrhage and local mass-effect. Multiple additional metastatic  lesions as above".  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 Pt refused recommended SNF level rehab following last hospital admit.    PT Comments    Attempted multiple times to assist pt with supine to sitting at edge of bed, however she was unable to follow commands, was very distracted, and had trouble with motor planning (she attempted to scoot up in the bed when she was asked to sit at edge of bed). She did sit in long sitting in bed for ~5 minutes, and was able to maintain her trunk in vertical position without support. Assisted pt with BLE exercises. Pt has poor insight into her condition. SNF recommended.     Recommendations for follow up therapy are one component of a multi-disciplinary discharge planning process, led by the attending physician.  Recommendations may be updated based on patient status, additional functional criteria and insurance authorization.  Follow Up Recommendations  Skilled nursing-short term rehab (<3 hours/day)     Assistance Recommended at Discharge Frequent or constant Supervision/Assistance  Patient can return home with the following Two people to help with walking and/or transfers;A lot of help with bathing/dressing/bathroom;Assist for transportation;Direct supervision/assist for medications management;Assistance with cooking/housework   Equipment Recommendations  Wheelchair (measurements PT);Wheelchair cushion (measurements PT)     Recommendations for Other Services       Precautions / Restrictions Precautions Precautions: Fall Precaution Comments: left hemiparesis, left neglect, inattention Restrictions Weight Bearing Restrictions: No     Mobility  Bed Mobility         Supine to sit: Supervision;HOB elevated     General bed mobility comments: pt refused supine<>sit at edge of bed, agreed to long sitting in bed, able to independently raise trunk to vertical using R bedrail to pull up, HOB up ~40*. Pt sat in long sitting for ~5 minutes, able to maintain trunk vertical without trunk support.    Transfers                   General transfer comment: pt refused    Ambulation/Gait                   Stairs             Wheelchair Mobility    Modified Rankin (Stroke Patients Only)       Balance Overall balance assessment: History of Falls;Needs assistance     Sitting balance - Comments: able to maintain trunk upright without support in long sit for approx 2 minutes x 2 attempts                                    Cognition Arousal/Alertness: Awake/alert Behavior During Therapy: Impulsive Overall Cognitive Status: Impaired/Different from baseline Area of Impairment: Attention;Memory;Following commands;Safety/judgement;Awareness;Problem solving                 Orientation Level: Disoriented to;Place;Time;Situation Current Attention Level: Focused Memory: Decreased recall of precautions;Decreased short-term memory Following Commands: Follows one step commands  inconsistently Safety/Judgement: Decreased awareness of safety;Decreased awareness of deficits Awareness: Intellectual Problem Solving: Requires verbal cues;Requires tactile cues;Decreased initiation;Difficulty sequencing;Slow processing General Comments: difficulty staying focused on task, easily distracted        Exercises General Exercises - Lower Extremity Ankle Circles/Pumps:  AAROM;Left;15 reps;Supine Heel Slides: AAROM;20 reps;Supine;AROM;Other (comment);Both (AROM R, AAROM L)    General Comments        Pertinent Vitals/Pain Faces Pain Scale: Hurts little more Pain Location: [place] Pain Descriptors / Indicators: Grimacing;Headache Pain Intervention(s): Limited activity within patient's tolerance;Monitored during session;Patient requesting pain meds-RN notified    Home Living                          Prior Function            PT Goals (current goals can now be found in the care plan section) Acute Rehab PT Goals Patient Stated Goal: to improve walking PT Goal Formulation: With patient Time For Goal Achievement: 04/10/21 Potential to Achieve Goals: Fair Progress towards PT goals: Not progressing toward goals - comment    Frequency    Min 2X/week      PT Plan Current plan remains appropriate    Co-evaluation              AM-PAC PT "6 Clicks" Mobility   Outcome Measure  Help needed turning from your back to your side while in a flat bed without using bedrails?: A Lot Help needed moving from lying on your back to sitting on the side of a flat bed without using bedrails?: A Lot Help needed moving to and from a bed to a chair (including a wheelchair)?: Total Help needed standing up from a chair using your arms (e.g., wheelchair or bedside chair)?: Total Help needed to walk in hospital room?: Total Help needed climbing 3-5 steps with a railing? : Total 6 Click Score: 8    End of Session   Activity Tolerance: Patient tolerated treatment well;No increased pain Patient left: in bed;with call bell/phone within reach;with bed alarm set Nurse Communication: Mobility status PT Visit Diagnosis: Difficulty in walking, not elsewhere classified (R26.2);History of falling (Z91.81)     Time: 5409-8119 PT Time Calculation (min) (ACUTE ONLY): 16 min  Charges:  $Therapeutic Activity: 8-22 mins                     Blondell Reveal Kistler PT 03/31/2021  Acute Rehabilitation Services Pager [phone] Office [phone]
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
## 2021-03-31 NOTE — Progress Notes (Signed)
PROGRESS NOTE    Amyla Heffner  BPZ:[id number] DOB: Jan 09, 1961 DOA: 03/11/2021 PCP: Merryl Hacker, No   Chief Complaint  Patient presents with   [facility]: [name], 61 y.o. female  stage IV T2A N2 M1B NSCLC lung CA used to follow-up with Dr. 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   Patient presented 03/11/2021 secondary to recurrent falls likely secondary to known metastatic lung cancer with brain metastasis. Decadron started. PT ordered. Palliative care consulted. Patient is being managed with steroids and radiation therapy and followed by palliative care    Subjective:  Comfortable in no distress  Assessment & Plan:  Recurrent falls with associated headache dizziness weakness and poor appetite:Deconditioning/debility debility Difficult placement: presentation is due to metastatic cancer . Cont on Decadron 4 mg Q8, supportive care, PT OT who have recommending SNF. 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  Started whole brain radiation therapy for palliation,  Rad onc consulted-started XRT12/27/22-continued through 04/01/21.   Continue Decadron, supportive care.  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 Continue Coreg 6.25 bid.  Monitor weight  CKD stage IIIb renal function at baseline in twos.  Anemia of chronic disease/malignancy hemoglobin is stable monitor  Hyperkalemia  had Lokelma 12/25.   Potassium slightly up on last labs-repeat in a.m. re-add Lokelma 5 daily, awake somewhat coherent gets easily distracted Power 5/5 in the right and   Polysubstance abuse history of cocaine use Alcohol abuse: Cont on folic acid thiamine and alcohol /cocaine cessation counseling  Severe malnutrition BMI 15 augment diet as tolerated. Nutrition Problem: Severe Malnutrition Etiology: chronic illness, cancer and cancer related treatments Signs/Symptoms: percent weight loss, energy intake < or equal to 75% for > or equal to 1 month, mild fat depletion, moderate muscle depletion Interventions: Ensure Enlive (each supplement provides 350kcal and 20 grams of protein), MVI    DVT prophylaxis: heparin injection 5,000 Units Start: 03/11/21 2200 Code Status:   Code Status: Full Code Family Communication: Called patient's Son at phone number [phone] Status is: Inpatient  Remains inpatient appropriate because: Ongoing need for radiation therapy and unsafe disposition need for skilled nursing facility Disposition: Currently NOT medically stable for discharge. Anticipated Disposition: skilled nursing facility -TOC on board and following   Objective: Vitals last 24 hrs: Vitals:   03/30/21 1350 03/30/21 2054 03/31/21 0633 03/31/21 1322  BP: 112/74 123/80 (!) 134/94 (!) 146/87  Pulse: 90 94 84 94  Resp: 16 18 16 16   Temp: 98 F (36.7 C) 98.2 F (36.8 C) 97.9 F (36.6 C) 98 F (36.7 C)  TempSrc: Oral Oral Oral Oral  SpO2: 99% 97% 99% 98%  Weight:      Height:       Weight change:   Intake/Output Summary (Last 24 hours) at 03/31/2021 1417 Last data filed at 03/31/2021  1300 Gross per 24 hour  Intake 1186 ml  Output 1400 ml   Net -214 ml    Net IO Since Admission: -3,629.66 mL [03/31/21 1417]   Physical Examination:  Awake, coherent gets easily distracted Power 5/5 in right upper and lower extremity densely plegic on left side No lower extremity edema Abdomen soft no rebound ROM intact Psych euthymic coherent   Medications reviewed:  Scheduled Meds:  carvedilol  6.25 mg Oral BID   Chlorhexidine Gluconate Cloth  6 each Topical Daily   dexamethasone  4 mg Oral Q8H   feeding supplement  237 mL Oral TID BM   ferrous sulfate  325 mg Oral BID WC   gabapentin  300 mg Oral QHS   heparin  5,000 Units Subcutaneous Q8H   melatonin  3 mg Oral QHS   multivitamin with minerals  1 tablet Oral Daily   nicotine  21 mg Transdermal Daily   pantoprazole  40 mg Oral BID   polyethylene glycol  17 g Oral BID   senna-docusate  1 tablet Oral BID   sodium zirconium cyclosilicate  5 g Oral Daily   thiamine  100 mg Oral Daily   Continuous Infusions:  Diet Order             DIET SOFT Room service appropriate? Yes; Fluid consistency: Thin  Diet effective now                   Nutrition Problem: Severe Malnutrition Etiology: chronic illness, cancer and cancer related treatments Signs/Symptoms: percent weight loss, energy intake < or equal to 75% for > or equal to 1 month, mild fat depletion, moderate muscle depletion Interventions: Ensure Enlive (each supplement provides 350kcal and 20 grams of protein), MVI  Weight change:   Wt Readings from Last 3 Encounters:  03/12/21 43.8 kg  08/16/20 56.5 kg  07/31/20 54 kg     Consultants:see note  Procedures:see note Antimicrobials: Anti-infectives (From admission, onward)    None      Culture/Microbiology    Component Value Date/Time   SDES  08/15/2020 1136    Lung, Left Performed at [facility], [place] [address], [place], [place] [address]    SPECREQUEST  08/15/2020 1136    NONE Performed at [facility], [place]  [address] [place], [place] [address]    CULT  08/15/2020 1136    NO GROWTH 3 DAYS Performed at [facility] [address], [place], [place] [address]    REPTSTATUS 08/18/2020 FINAL 08/15/2020 1136    Other culture-see note  Unresulted Labs (From admission, onward)    None     Data Reviewed: I have personally reviewed following labs and imaging studies CBC: Recent Labs  Lab 03/29/21 0614  WBC 5.6  NEUTROABS 4.2  HGB 8.9*  HCT 27.6*  MCV 105.7*  PLT 240    Basic Metabolic Panel: Recent Labs  Lab 03/29/21 0614  NA 137  K 5.4*  CL 104  CO2 25  GLUCOSE 113*  BUN 87*  CREATININE 2.48*  CALCIUM 8.8*    GFR: Estimated Creatinine Clearance: 17.1 mL/min (A) (by C-G formula based on SCr of 2.48 mg/dL (H)). Liver Function Tests: No results for input(s): AST, ALT, ALKPHOS, BILITOT, PROT, ALBUMIN in the last 168 hours.  No results for input(s): LIPASE, AMYLASE in the last 168 hours. No results for input(s): AMMONIA in the last 168 hours. Coagulation Profile: No results for input(s): INR, PROTIME in the  last 168 hours. Cardiac Enzymes: No results for input(s): CKTOTAL, CKMB, CKMBINDEX, TROPONINI in the last 168 hours. BNP (last 3 results) No results for input(s): PROBNP in the last 8760 hours. HbA1C: No results for input(s): HGBA1C in the last 72 hours. CBG: No results for input(s): GLUCAP in the last 168 hours. Lipid Profile: No results for input(s): CHOL, HDL, LDLCALC, TRIG, CHOLHDL, LDLDIRECT in the last 72 hours. Thyroid Function Tests: No results for input(s): TSH, T4TOTAL, FREET4, T3FREE, THYROIDAB in the last 72 hours. Anemia Panel: No results for input(s): VITAMINB12, FOLATE, FERRITIN, TIBC, IRON, RETICCTPCT in the last 72 hours. Sepsis Labs: No results for input(s): PROCALCITON, LATICACIDVEN in the last 168 hours.  No results found for this or any previous visit (from the past 240 hour(s)).    Radiology Studies: No results found.   LOS: 20  days   Nita Sells, MD [facility]  03/31/2021, 2:17 PM

## 2021-04-01 ENCOUNTER — Encounter: Payer: Self-pay | Admitting: Radiation Oncology

## 2021-04-01 ENCOUNTER — Ambulatory Visit
Admit: 2021-04-01 | Discharge: 2021-04-01 | Disposition: A | Discharge status: Home / Self Care | Payer: Medicaid Other | Attending: Radiation Oncology | Admitting: Radiation Oncology

## 2021-04-01 DIAGNOSIS — C3490 Malignant neoplasm of unspecified part of unspecified bronchus or lung: Secondary | ICD-10-CM | POA: Diagnosis not present

## 2021-04-01 DIAGNOSIS — C7931 Secondary malignant neoplasm of brain: Secondary | ICD-10-CM | POA: Diagnosis not present

## 2021-04-01 LAB — BASIC METABOLIC PANEL
Anion gap: 7 (ref 5–15)
BUN: 84 mg/dL — ABNORMAL HIGH (ref 6–20)
CO2: 22 mmol/L (ref 22–32)
Calcium: 8.4 mg/dL — ABNORMAL LOW (ref 8.9–10.3)
Chloride: 106 mmol/L (ref 98–111)
Creatinine, Ser: 2.58 mg/dL — ABNORMAL HIGH (ref 0.44–1.00)
GFR, Estimated: 21 mL/min — ABNORMAL LOW (ref >60)
Glucose, Bld: 137 mg/dL — ABNORMAL HIGH (ref 70–99)
Potassium: 4.9 mmol/L (ref 3.5–5.1)
Sodium: 135 mmol/L (ref 135–145)

## 2021-04-01 NOTE — Progress Notes (Signed)
Nutrition Follow-up  DOCUMENTATION CODES:   Severe malnutrition in context of chronic illness, Underweight  INTERVENTION:   -Ensure Enlive po TID, each supplement provides 350 kcal and 20 grams of protein   -Multivitamin with minerals daily  -Recommend new weight for admission  NUTRITION DIAGNOSIS:   Severe Malnutrition related to chronic illness, cancer and cancer related treatments as evidenced by percent weight loss, energy intake < or equal to 75% for > or equal to 1 month, mild fat depletion, moderate muscle depletion.  Ongoing.  GOAL:   Patient will meet greater than or equal to 90% of their needs  Progressing.  MONITOR:   PO intake, Supplement acceptance, Labs, Weight trends, I & O's  ASSESSMENT:   61 y.o. female with PMH significant for stage IV lung CA, used to follow-up with Dr. Julien Nordmann, CHF, HIV +,  tobacco abuse, EtOH abuse, hypoalbuminemia, polysubstance abuse presented to the ED with complaints of recurrent falls.  12/27: started XRT: expected completion 1/10  Patient consuming 50-100% of meals at this time. Accepting Ensure supplements.   Weight 12/21: 96 lbs. Needs updated weight for admission.  Medications: Ferrous sulfate, Multivitamin with minerals daily, Miralax, Senokot, Lokelma, Thiamine  Labs reviewed: Elevated K  Diet Order:   Diet Order             DIET SOFT Room service appropriate? Yes; Fluid consistency: Thin  Diet effective now                   EDUCATION NEEDS:   Education needs have been addressed  Skin:  Skin Assessment: Reviewed RN Assessment  Last BM:  1/10 -type 6  Height:   Ht Readings from Last 1 Encounters:  03/12/21 5\' 6"  (1.676 m)    Weight:   Wt Readings from Last 1 Encounters:  03/12/21 43.8 kg    BMI:  Body mass index is 15.59 kg/m.  Estimated Nutritional Needs:   Kcal:  2000-2200  Protein:  90-110g  Fluid:  2.2L/day  Clayton Bibles, MS, RD, LDN Inpatient Clinical Dietitian Contact  information available via Amion

## 2021-04-01 NOTE — Progress Notes (Signed)
Patient's son, [name] called to find out what is going on with his mother. Informed him of status and notes placed in chart today. Informed him that I will inform day shift nurse to inform physician to try to call son again.

## 2021-04-01 NOTE — Progress Notes (Signed)
PROGRESS NOTE    Brenda [name]  RXV:[id number] DOB: [birth year] DOA: 03/11/2021 PCP: Merryl Hacker, No   Chief Complaint  Patient presents with   [facility] Course: [name], 61 y.o. female  stage IV T2A N2 M1B NSCLC lung CA used to follow-up with Dr. 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   Patient presented 03/11/2021 secondary to recurrent falls likely secondary to known metastatic lung cancer with brain metastasis. Decadron started. PT ordered. Palliative care consulted. Patient is being managed with steroids and radiation therapy and followed by palliative care    Subjective:  Comfortable in no distress-smiling and laughing  Assessment & Plan:  Recurrent falls with associated headache dizziness weakness and poor appetite:Deconditioning/debility debility Difficult placement: presentation is due to metastatic cancer. Cont on Decadron 4 mg Q8, supportive care, PT-OT who have recommending SNF. in the process of being faxed out for skilled facility--might be hard to place and await further input from TOC  Stage IV metastatic lung cancer Brain mets with left-sided upper plegia left-sided lower weakness Underwent MRI, noncontrast CT, seen by oncology, recommendation for medical treatment at this time neuro neurosurgery evaluated-offered surgical management if patient desires, pt declined.  Started whole brain radiation therapy for palliation Rad onc consulted-started XRT12/27/22-continued through 04/01/21.   Continue Decadron, supportive care.  Continue pain control  Left pleural effusion in the setting of stage IV lung cancer doing well on room air-no further work-up needed  Chronic  combined systolic and diastolic CHF with EF 50-93% G1 DD. remains euvolemic. Continue Coreg 6.25 bid.  Monitor weight  CKD stage IIIb renal function at baseline in twos.  Anemia of chronic disease/malignancy hemoglobin is stable monitor  Hyperkalemia   Potassium slightly up on last labs-repeat in a.m. re-add Lokelma 5 daily Labs q 2-3 daily  Polysubstance abuse history of cocaine use Alcohol abuse: Cont on folic acid thiamine and alcohol /cocaine cessation counseling  Severe malnutrition BMI 15 augment diet as tolerated. Nutrition Problem: Severe Malnutrition Etiology: chronic illness, cancer and cancer related treatments Signs/Symptoms: percent weight loss, energy intake < or equal to 75% for > or equal to 1 month, mild fat depletion, moderate muscle depletion Interventions: Ensure Enlive (each supplement provides 350kcal and 20 grams of protein), MVI    DVT prophylaxis: heparin injection 5,000 Units Start: 03/11/21 2200 Code Status:   Code Status: Full Code Family Communication: Called patient's Son Daryel November at phone number [phone] not reach him on 1/10, so left a VM  Status is: Inpatient  Remains inpatient appropriate because: Ongoing need for radiation therapy and unsafe disposition need for skilled nursing facility Disposition: Currently NOT medically stable for discharge. Anticipated Disposition: skilled nursing facility -TOC on board and following   Objective: Vitals last 24 hrs: Vitals:   03/31/21 0633 03/31/21 1322 03/31/21 2042 04/01/21 0454  BP: (!) 134/94 (!) 146/87 137/89 133/88  Pulse: 84 94 89 76  Resp: 16 16 16 15   Temp: 97.9 F (36.6 C) 98 F (36.7 C) 98.7 F (37.1 C) 98.3 F (36.8 C)  TempSrc: Oral Oral Oral Oral  SpO2: 99% 98% 98% 98%  Weight:      Height:       Weight change:   Intake/Output Summary (Last 24 hours) at 04/01/2021 1652 Last data filed at 04/01/2021 1100 Gross  per 24 hour  Intake 835 ml  Output 900 ml  Net -65 ml     Net IO Since Admission: -3,694.66 mL [04/01/21 1652]   Physical Examination:  Awake, coherent gets easily distracted Power 5/5 in right upper and lower extremity densely plegic on left side No lower extremity edema Abdomen soft no rebound ROM intact Psych euthymic coherent   Medications reviewed:  Scheduled Meds:  carvedilol  6.25 mg Oral BID   Chlorhexidine Gluconate Cloth  6 each Topical Daily   dexamethasone  4 mg Oral Q8H   feeding supplement  237 mL Oral TID BM   ferrous sulfate  325 mg Oral BID WC   gabapentin  300 mg Oral QHS   heparin  5,000 Units Subcutaneous Q8H   melatonin  3 mg Oral QHS   multivitamin with minerals  1 tablet Oral Daily   nicotine  21 mg Transdermal Daily   pantoprazole  40 mg Oral BID   polyethylene glycol  17 g Oral BID   senna-docusate  1 tablet Oral BID   sodium zirconium cyclosilicate  5 g Oral Daily   thiamine  100 mg Oral Daily   Continuous Infusions:  Diet Order             DIET SOFT Room service appropriate? Yes; Fluid consistency: Thin  Diet effective now                   Nutrition Problem: Severe Malnutrition Etiology: chronic illness, cancer and cancer related treatments Signs/Symptoms: percent weight loss, energy intake < or equal to 75% for > or equal to 1 month, mild fat depletion, moderate muscle depletion Interventions: Ensure Enlive (each supplement provides 350kcal and 20 grams of protein), MVI  Weight change:   Wt Readings from Last 3 Encounters:  03/12/21 43.8 kg  08/16/20 56.5 kg  07/31/20 54 kg     Consultants:see note  Procedures:see note Antimicrobials: Anti-infectives (From admission, onward)    None      Culture/Microbiology    Component Value Date/Time   SDES  08/15/2020 1136    Lung, Left Performed at [facility], [place] [address], [place], [place] [address]    SPECREQUEST  08/15/2020 1136    NONE Performed at [facility], [place] [address],  [place], [place] [address]    CULT  08/15/2020 1136    NO GROWTH 3 DAYS Performed at [facility] [address], [place], [place] [address]    REPTSTATUS 08/18/2020 FINAL 08/15/2020 1136    Other culture-see note  Unresulted Labs (From admission, onward)    None     Data Reviewed: I have personally reviewed following labs and imaging studies CBC: Recent Labs  Lab 03/29/21 0614  WBC 5.6  NEUTROABS 4.2  HGB 8.9*  HCT 27.6*  MCV 105.7*  PLT 034    Basic Metabolic Panel: Recent Labs  Lab 03/29/21 0614 04/01/21 0930  NA 137 135  K 5.4* 4.9  CL 104 106  CO2 25 22  GLUCOSE 113* 137*  BUN 87* 84*  CREATININE 2.48* 2.58*  CALCIUM 8.8* 8.4*    GFR: Estimated Creatinine Clearance: 16.4 mL/min (A) (by C-G formula based on SCr of 2.58 mg/dL (H)). Liver Function Tests: No results for input(s): AST, ALT, ALKPHOS, BILITOT, PROT, ALBUMIN in the last 168 hours.  No results for input(s): LIPASE, AMYLASE in the last 168 hours. No results for input(s): AMMONIA in the last 168 hours. Coagulation Profile:  No results for input(s): INR, PROTIME in the last 168 hours. Cardiac Enzymes: No results for input(s): CKTOTAL, CKMB, CKMBINDEX, TROPONINI in the last 168 hours. BNP (last 3 results) No results for input(s): PROBNP in the last 8760 hours. HbA1C: No results for input(s): HGBA1C in the last 72 hours. CBG: No results for input(s): GLUCAP in the last 168 hours. Lipid Profile: No results for input(s): CHOL, HDL, LDLCALC, TRIG, CHOLHDL, LDLDIRECT in the last 72 hours. Thyroid Function Tests: No results for input(s): TSH, T4TOTAL, FREET4, T3FREE, THYROIDAB in the last 72 hours. Anemia Panel: No results for input(s): VITAMINB12, FOLATE, FERRITIN, TIBC, IRON, RETICCTPCT in the last 72 hours. Sepsis Labs: No results for input(s): PROCALCITON, LATICACIDVEN in the last 168 hours.  No results found for this or any previous visit (from the past 240 hour(s)).    Radiology  Studies: No results found.   LOS: 21 days   Nita Sells, MD [facility]  04/01/2021, 4:52 PM

## 2021-04-02 ENCOUNTER — Encounter: Payer: Self-pay | Admitting: Internal Medicine

## 2021-04-02 ENCOUNTER — Encounter: Payer: Self-pay | Admitting: Physician Assistant

## 2021-04-02 DIAGNOSIS — C3490 Malignant neoplasm of unspecified part of unspecified bronchus or lung: Secondary | ICD-10-CM | POA: Diagnosis not present

## 2021-04-02 DIAGNOSIS — C7931 Secondary malignant neoplasm of brain: Secondary | ICD-10-CM | POA: Diagnosis not present

## 2021-04-02 DIAGNOSIS — F1010 Alcohol abuse, uncomplicated: Secondary | ICD-10-CM | POA: Diagnosis not present

## 2021-04-02 DIAGNOSIS — W19XXXA Unspecified fall, initial encounter: Secondary | ICD-10-CM | POA: Diagnosis not present
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
## 2021-04-02 NOTE — TOC Progression Note (Signed)
Transition of Care [facility]) - Progression Note    Patient Details  Name: Kian Gamarra MRN: [id number] Date of Birth: 1960-03-31  Transition of Care [facility]) CM/SW Contact  Sayed Apostol, Marjie Skiff, RN Phone Number: 04/02/2021, 11:59 AM  Clinical Narrative:     TOC continues to have no SNF bed offers for pt. Multiple SNF facilities contacted to inquire about Medicaid bed for pt. Spoke with son [name] at bedside to inform him that radiation is completed as of yesterday and we have no SNF options at this point. He was asked to speak with family about other possible disposition options.  Expected Discharge Plan: Skilled Nursing Facility Barriers to Discharge: Continued Medical Work up  Expected Discharge Plan and Services Expected Discharge Plan: [place]   Discharge Planning Services: CM Consult   Living arrangements for the past 2 months: Single Family Home                           HH Arranged: PT, OT [facility] Agency: [place] Date [facility] Agency Contacted: 03/14/21 Time [place]: [id number] Representative spoke with at [place]: [place] ([place]) Interventions    Readmission Risk Interventions Readmission Risk Prevention Plan 03/14/2021 07/17/2020 04/11/2020  Transportation Screening Complete Complete Complete  PCP or Specialist Appt within 3-5 Days - - Complete  HRI or [place] - Complete Complete  Social Work Consult for [place] Planning/Counseling - Complete Complete  Palliative Care Screening - Not Applicable Not Applicable  Medication Review Press photographer) Complete Complete Complete  PCP or Specialist appointment within 3-5 days of discharge Complete - -  [place] or Home Care Consult Complete - -  SW Recovery Care/Counseling Consult Complete - -  Palliative Care Screening Complete - -  [place] Patient Refused - -  Some recent data might be hidden

## 2021-04-02 NOTE — Progress Notes (Signed)
° °                                                                                                                                                          °  Patient Name: Brenda [name] MRN: [id number] DOB: 06/24/1960 Referring Physician:  Date of Service: 04/01/2021 [facility], [place]                                                        End Of Treatment Note  Diagnoses: C79.31-Secondary malignant neoplasm of brain  Cancer Staging: Stage IV (T2 a, N2, M1 B) non-small cell lung cancer, adenocarcinoma, RUL with brain metastases.   Intent: Palliative  Radiation Treatment Dates: 03/18/2021 through 04/01/2021 Site Technique Total Dose (Gy) Dose per Fx (Gy) Completed Fx Beam Energies  Brain: Whole Brain Radiation Complex 30/30 3 10/10 6X   Narrative: The patient tolerated radiation therapy relatively well.   Plan: The patient will receive a call in about one month from the radiation oncology department. She will continue follow up with Dr. Julien Nordmann as well.   ________________________________________________    Carola Rhine, [facility]

## 2021-04-02 NOTE — Progress Notes (Signed)
PROGRESS NOTE    Brenda [name]  YCX:[id number] DOB: [birth year] DOA: 03/11/2021 PCP: Pcp, No    Brief Narrative:  61 y.o. female  stage IV T2A N2 M1B NSCLC lung CA presented with recurrent falls secondary to known metastatic lung cancer with brain mets, on decadron and on radiation tx  Assessment & Plan:   Principal Problem:   Primary malignant neoplasm of lung with metastasis to brain [facility]) Active Problems:   Major depressive disorder, recurrent episode with mood-congruent psychotic features ([place])   Alcohol abuse   Malignant neoplasm of right lung ([place])   Polysubstance abuse ([place])   Hypoalbuminemia   Acute on chronic combined systolic and diastolic congestive heart failure (HCC)   Protein-calorie malnutrition, severe  Recurrent falls with associated headache dizziness weakness and poor appetite:Deconditioning/debility debility Difficult placement: presentation is due to metastatic cancer. Cont on Decadron 4 mg Q8, supportive care, PT-OT who have recommending SNF. Disposition planning remains in progress. TOC is following   Stage IV metastatic lung cancer Brain mets with left-sided upper plegia left-sided lower weakness Underwent MRI, noncontrast CT, seen by oncology, recommendation for medical treatment at this time neuro neurosurgery evaluated-offered surgical management if patient desires, pt declined.  Started whole brain radiation therapy for palliation Rad onc consulted-started XRT12/27/22-completed through 04/01/21.   Continue Decadron, supportive care.  Continue pain control   Left pleural effusion in the setting of stage IV lung cancer doing well on room air-no further work-up needed   Chronic combined systolic and diastolic CHF with EF 49-70% G1 DD. remains euvolemic. Continue Coreg 6.25 bid.  Monitor weight   CKD stage IIIb renal function at baseline in twos.   Anemia of chronic disease/malignancy hemoglobin is stable monitor   Hyperkalemia   Potassium  slightly up on last labs-repeat in a.m. re-add Lokelma 5 daily Cont to follow bmet trends   Polysubstance abuse history of cocaine use Alcohol abuse: Cont on folic acid thiamine and alcohol /cocaine cessation counseling   Severe malnutrition BMI 15 augment diet as tolerated. Nutrition Problem: Severe Malnutrition Etiology: chronic illness, cancer and cancer related treatments Signs/Symptoms: percent weight loss, energy intake < or equal to 75% for > or equal to 1 month, mild fat depletion, moderate muscle depletion Interventions: Ensure Enlive (each supplement provides 350kcal and 20 grams of protein), MVI   DVT prophylaxis: Heparin subq Code Status: Full Family Communication: Pt in room, family not at bedside  Status is: Inpatient  Remains inpatient appropriate because: Severity of illness and disposition issues   Consultants:  Rad Onc Palliative Care  Procedures:    Antimicrobials: Anti-infectives (From admission, onward)    None       Subjective: Without complaints this afternoon  Objective: Vitals:   04/01/21 0454 04/01/21 2129 04/02/21 0431 04/02/21 1341  BP: 133/88 (!) 167/96 135/85 (!) 139/91  Pulse: 76 90 75 89  Resp: 15  16 16   Temp: 98.3 F (36.8 C) 98.9 F (37.2 C) 98 F (36.7 C) 97.7 F (36.5 C)  TempSrc: Oral Oral Oral Oral  SpO2: 98% 99% 100% 100%  Weight:      Height:        Intake/Output Summary (Last 24 hours) at 04/02/2021 1847 Last data filed at 04/02/2021 1000 Gross per 24 hour  Intake 120 ml  Output 500 ml  Net -380 ml   Filed Weights   03/11/21 1432 03/12/21 0430  Weight: 56.7 kg 43.8 kg    Examination: General exam: Awake, laying in bed, in nad Respiratory system:  Normal respiratory effort, no wheezing Cardiovascular system: regular rate, s1, s2 Gastrointestinal system: Soft, nondistended, positive BS Central nervous system: CN2-12 grossly intact, strength intact Extremities: Perfused, no clubbing Skin: Normal skin  turgor, no notable skin lesions seen Psychiatry: Mood normal // no visual hallucinations   Data Reviewed: I have personally reviewed following labs and imaging studies  CBC: Recent Labs  Lab 03/29/21 0614  WBC 5.6  NEUTROABS 4.2  HGB 8.9*  HCT 27.6*  MCV 105.7*  PLT 008   Basic Metabolic Panel: Recent Labs  Lab 03/29/21 0614 04/01/21 0930  NA 137 135  K 5.4* 4.9  CL 104 106  CO2 25 22  GLUCOSE 113* 137*  BUN 87* 84*  CREATININE 2.48* 2.58*  CALCIUM 8.8* 8.4*   GFR: Estimated Creatinine Clearance: 16.4 mL/min (A) (by C-G formula based on SCr of 2.58 mg/dL (H)). Liver Function Tests: No results for input(s): AST, ALT, ALKPHOS, BILITOT, PROT, ALBUMIN in the last 168 hours. No results for input(s): LIPASE, AMYLASE in the last 168 hours. No results for input(s): AMMONIA in the last 168 hours. Coagulation Profile: No results for input(s): INR, PROTIME in the last 168 hours. Cardiac Enzymes: No results for input(s): CKTOTAL, CKMB, CKMBINDEX, TROPONINI in the last 168 hours. BNP (last 3 results) No results for input(s): PROBNP in the last 8760 hours. HbA1C: No results for input(s): HGBA1C in the last 72 hours. CBG: No results for input(s): GLUCAP in the last 168 hours. Lipid Profile: No results for input(s): CHOL, HDL, LDLCALC, TRIG, CHOLHDL, LDLDIRECT in the last 72 hours. Thyroid Function Tests: No results for input(s): TSH, T4TOTAL, FREET4, T3FREE, THYROIDAB in the last 72 hours. Anemia Panel: No results for input(s): VITAMINB12, FOLATE, FERRITIN, TIBC, IRON, RETICCTPCT in the last 72 hours. Sepsis Labs: No results for input(s): PROCALCITON, LATICACIDVEN in the last 168 hours.  No results found for this or any previous visit (from the past 240 hour(s)).   Radiology Studies: No results found.  Scheduled Meds:  carvedilol  6.25 mg Oral BID   Chlorhexidine Gluconate Cloth  6 each Topical Daily   dexamethasone  4 mg Oral Q8H   feeding supplement  237 mL Oral TID  BM   ferrous sulfate  325 mg Oral BID WC   gabapentin  300 mg Oral QHS   heparin  5,000 Units Subcutaneous Q8H   melatonin  3 mg Oral QHS   multivitamin with minerals  1 tablet Oral Daily   nicotine  21 mg Transdermal Daily   pantoprazole  40 mg Oral BID   polyethylene glycol  17 g Oral BID   senna-docusate  1 tablet Oral BID   sodium zirconium cyclosilicate  5 g Oral Daily   thiamine  100 mg Oral Daily   Continuous Infusions:   LOS: 22 days   Marylu Lund, MD [facility] Pager On Amion  If 7PM-7AM, please contact night-coverage 04/02/2021, 6:47 PM

## 2021-04-03 DIAGNOSIS — C7931 Secondary malignant neoplasm of brain: Secondary | ICD-10-CM | POA: Diagnosis not present

## 2021-04-03 DIAGNOSIS — W19XXXA Unspecified fall, initial encounter: Secondary | ICD-10-CM | POA: Diagnosis not present

## 2021-04-03 DIAGNOSIS — F1010 Alcohol abuse, uncomplicated: Secondary | ICD-10-CM | POA: Diagnosis not present

## 2021-04-03 DIAGNOSIS — C3490 Malignant neoplasm of unspecified part of unspecified bronchus or lung: Secondary | ICD-10-CM | POA: Diagnosis not present

## 2021-04-03 LAB — COMPREHENSIVE METABOLIC PANEL
ALT: 44 U/L (ref 0–44)
AST: 17 U/L (ref 15–41)
Albumin: 2.7 g/dL — ABNORMAL LOW (ref 3.5–5.0)
Alkaline Phosphatase: 57 U/L (ref 38–126)
Anion gap: 7 (ref 5–15)
BUN: 87 mg/dL — ABNORMAL HIGH (ref 6–20)
CO2: 23 mmol/L (ref 22–32)
Calcium: 8.6 mg/dL — ABNORMAL LOW (ref 8.9–10.3)
Chloride: 106 mmol/L (ref 98–111)
Creatinine, Ser: 2.7 mg/dL — ABNORMAL HIGH (ref 0.44–1.00)
GFR, Estimated: 20 mL/min — ABNORMAL LOW (ref >60)
Glucose, Bld: 130 mg/dL — ABNORMAL HIGH (ref 70–99)
Potassium: 4.6 mmol/L (ref 3.5–5.1)
Sodium: 136 mmol/L (ref 135–145)
Total Bilirubin: 0.5 mg/dL (ref 0.3–1.2)
Total Protein: 6.1 g/dL — ABNORMAL LOW (ref 6.5–8.1)

## 2021-04-03 MED ORDER — ALUM & MAG HYDROXIDE-SIMETH 200-200-20 MG/5ML PO SUSP
15.0000 mL | Freq: Four times a day (QID) | ORAL | Status: DC | PRN
  Administered 2021-04-03 – 2021-04-04 (×3): 15 mL via ORAL
  Filled 2021-04-03 (×3): qty 30

## 2021-04-03 NOTE — Progress Notes (Signed)
PROGRESS NOTE    Brenda [name]  [id number]:[id number] DOB: 1960/07/18 DOA: 03/11/2021 PCP: Pcp, No    Brief Narrative:  61 y.o. female  stage IV T2A N2 M1B NSCLC lung CA presented with recurrent falls secondary to known metastatic lung cancer with brain mets, on decadron and on radiation tx  Assessment & Plan:   Principal Problem:   Primary malignant neoplasm of lung with metastasis to brain [facility]) Active Problems:   Major depressive disorder, recurrent episode with mood-congruent psychotic features ([place])   Alcohol abuse   Malignant neoplasm of right lung ([place])   Polysubstance abuse ([place])   Hypoalbuminemia   Acute on chronic combined systolic and diastolic congestive heart failure (HCC)   Protein-calorie malnutrition, severe  Recurrent falls with associated headache dizziness weakness and poor appetite:Deconditioning/debility debility Difficult placement: presentation is due to metastatic cancer. Cont on Decadron 4 mg Q8, supportive care, PT-OT who have recommending SNF. Disposition planning remains in progress. TOC cont to follow   Stage IV metastatic lung cancer Brain mets with left-sided upper plegia left-sided lower weakness Underwent MRI, noncontrast CT, seen by oncology, recommendation for medical treatment at this time neuro neurosurgery evaluated-offered surgical management if patient desires, pt declined.  Started whole brain radiation therapy for palliation Rad onc consulted-started XRT12/27/22-completed through 04/01/21.   Continue Decadron, supportive care.   Cont with analgesia as needed   Left pleural effusion in the setting of stage IV lung cancer doing well on room air-no further work-up needed   Chronic combined systolic and diastolic CHF with EF 67-89% G1 DD. remains euvolemic. Continue Coreg 6.25 bid.  Monitor weight   CKD stage IIIb renal function at baseline in twos.   Anemia of chronic disease/malignancy hemoglobin is stable monitor   Hyperkalemia    Resolved Cont to monitor potassium Lokelma has since been discontinued   Polysubstance abuse history of cocaine use Alcohol abuse: Cont on folic acid thiamine and alcohol /cocaine cessation counseling   Severe malnutrition BMI 15 augment diet as tolerated. Nutrition Problem: Severe Malnutrition Etiology: chronic illness, cancer and cancer related treatments Signs/Symptoms: percent weight loss, energy intake < or equal to 75% for > or equal to 1 month, mild fat depletion, moderate muscle depletion Interventions: Ensure Enlive (each supplement provides 350kcal and 20 grams of protein), MVI   DVT prophylaxis: Heparin subq Code Status: Full Family Communication: Pt in room, family not at bedside  Status is: Inpatient  Remains inpatient appropriate because: Severity of illness and disposition issues   Consultants:  Rad Onc Palliative Care  Procedures:    Antimicrobials: Anti-infectives (From admission, onward)    None       Subjective: Without complaints today. Tolerating diet  Objective: Vitals:   04/02/21 2128 04/02/21 2254 04/03/21 0606 04/03/21 1420  BP: 134/86 (!) 159/98 130/84 132/82  Pulse: 89 99 79 89  Resp: 16  14 18   Temp: 98.2 F (36.8 C)  97.7 F (36.5 C) 98 F (36.7 C)  TempSrc: Oral  Oral   SpO2: 99%  99% 100%  Weight:      Height:        Intake/Output Summary (Last 24 hours) at 04/03/2021 1825 Last data filed at 04/03/2021 1100 Gross per 24 hour  Intake 593 ml  Output 350 ml  Net 243 ml    Filed Weights   03/11/21 1432 03/12/21 0430  Weight: 56.7 kg 43.8 kg    Examination: General exam: Conversant, in no acute distress Respiratory system: normal chest rise, clear, no audible  wheezing Cardiovascular system: regular rhythm, s1-s2 Gastrointestinal system: Nondistended, nontender, pos BS Central nervous system: No seizures, no tremors Extremities: No cyanosis, no joint deformities Skin: No rashes, no pallor Psychiatry: Affect normal  // no auditory hallucinations   Data Reviewed: I have personally reviewed following labs and imaging studies  CBC: Recent Labs  Lab 03/29/21 0614  WBC 5.6  NEUTROABS 4.2  HGB 8.9*  HCT 27.6*  MCV 105.7*  PLT 696    Basic Metabolic Panel: Recent Labs  Lab 03/29/21 0614 04/01/21 0930 04/03/21 0457  NA 137 135 136  K 5.4* 4.9 4.6  CL 104 106 106  CO2 25 22 23   GLUCOSE 113* 137* 130*  BUN 87* 84* 87*  CREATININE 2.48* 2.58* 2.70*  CALCIUM 8.8* 8.4* 8.6*    GFR: Estimated Creatinine Clearance: 15.7 mL/min (A) (by C-G formula based on SCr of 2.7 mg/dL (H)). Liver Function Tests: Recent Labs  Lab 04/03/21 0457  AST 17  ALT 44  ALKPHOS 57  BILITOT 0.5  PROT 6.1*  ALBUMIN 2.7*   No results for input(s): LIPASE, AMYLASE in the last 168 hours. No results for input(s): AMMONIA in the last 168 hours. Coagulation Profile: No results for input(s): INR, PROTIME in the last 168 hours. Cardiac Enzymes: No results for input(s): CKTOTAL, CKMB, CKMBINDEX, TROPONINI in the last 168 hours. BNP (last 3 results) No results for input(s): PROBNP in the last 8760 hours. HbA1C: No results for input(s): HGBA1C in the last 72 hours. CBG: No results for input(s): GLUCAP in the last 168 hours. Lipid Profile: No results for input(s): CHOL, HDL, LDLCALC, TRIG, CHOLHDL, LDLDIRECT in the last 72 hours. Thyroid Function Tests: No results for input(s): TSH, T4TOTAL, FREET4, T3FREE, THYROIDAB in the last 72 hours. Anemia Panel: No results for input(s): VITAMINB12, FOLATE, FERRITIN, TIBC, IRON, RETICCTPCT in the last 72 hours. Sepsis Labs: No results for input(s): PROCALCITON, LATICACIDVEN in the last 168 hours.  No results found for this or any previous visit (from the past 240 hour(s)).   Radiology Studies: No results found.  Scheduled Meds:  carvedilol  6.25 mg Oral BID   Chlorhexidine Gluconate Cloth  6 each Topical Daily   dexamethasone  4 mg Oral Q8H   feeding supplement  237  mL Oral TID BM   ferrous sulfate  325 mg Oral BID WC   gabapentin  300 mg Oral QHS   heparin  5,000 Units Subcutaneous Q8H   melatonin  3 mg Oral QHS   multivitamin with minerals  1 tablet Oral Daily   nicotine  21 mg Transdermal Daily   pantoprazole  40 mg Oral BID   polyethylene glycol  17 g Oral BID   senna-docusate  1 tablet Oral BID   thiamine  100 mg Oral Daily   Continuous Infusions:   LOS: 23 days   Marylu Lund, MD [facility] Pager On Amion  If 7PM-7AM, please contact night-coverage 04/03/2021, 6:25 PM

## 2021-04-03 NOTE — Progress Notes (Signed)
Physical Therapy Treatment Patient Details Name: Brenda [name] MRN: [id number] DOB: 10/13/1960 Today's Date: 04/03/2021   History of Present Illness 61 years old female adm with AMS/confusion and falls. 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  Large cystic mass in the right occipital parietal lobe with prior  hemorrhage and local mass-effect. Multiple additional metastatic  lesions as above".  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 Pt refused recommended SNF level rehab following last hospital admit.    PT Comments    Pt is oriented to self, month/year, and to location. She continues to exhibit L sided neglect. Min assist for bed mobility. Pt sat edge of bed x 10 minutes and performed BLE strengthening exercise. Sit to stand x 2 trials with mod assist for balance. Pt able to stand for ~5 seconds, duration limited by L knee buckling.     Recommendations for follow up therapy are one component of a multi-disciplinary discharge planning process, led by the attending physician.  Recommendations may be updated based on patient status, additional functional criteria and insurance authorization.  Follow Up Recommendations  Skilled nursing-short term rehab (<3 hours/day)     Assistance Recommended at Discharge Frequent or constant Supervision/Assistance  Patient can return home with the following Two people to help with walking and/or transfers;A lot of help with bathing/dressing/bathroom;Assist for transportation;Direct supervision/assist for medications management;Assistance with cooking/housework;Help with stairs or ramp for entrance;Direct supervision/assist for financial management   Equipment Recommendations  Wheelchair (measurements PT);Wheelchair cushion (measurements PT)    Recommendations for Other Services       Precautions /  Restrictions Precautions Precautions: Fall Precaution Comments: left hemiparesis, left neglect, inattention Restrictions Weight Bearing Restrictions: No     Mobility  Bed Mobility Overal bed mobility: Needs Assistance Bed Mobility: Rolling;Supine to Sit Rolling: Min assist   Supine to sit: Supervision;HOB elevated     General bed mobility comments: min A to roll L, Mod A to roll R (pt neglects L side); pt sat EOB x 10 minutes    Transfers Overall transfer level: Needs assistance Equipment used: Rolling walker (2 wheels) Transfers: Sit to/from Stand Sit to Stand: Mod assist           General transfer comment: sit to stand at EOB x 2 trials, mod A for balance, L knee buckled after standing ~5 seconds    Ambulation/Gait                   Stairs             Wheelchair Mobility    Modified Rankin (Stroke Patients Only)       Balance Overall balance assessment: History of Falls;Needs assistance Sitting-balance support: Single extremity supported;Feet supported Sitting balance-Leahy Scale: Fair Sitting balance - Comments: able to maintain trunk in upright postiion with no leaning on edge of bed.                                    Cognition Arousal/Alertness: Awake/alert Behavior During Therapy: Impulsive Overall Cognitive Status: Impaired/Different from baseline Area of Impairment: Attention;Memory;Following commands;Safety/judgement;Awareness;Problem solving                 Orientation Level: Disoriented to;Situation Current Attention Level: Focused Memory: Decreased recall of precautions;Decreased short-term memory Following Commands: Follows one step commands inconsistently Safety/Judgement: Decreased  awareness of safety;Decreased awareness of deficits   Problem Solving: Requires verbal cues;Requires tactile cues;Decreased initiation;Difficulty sequencing;Slow processing General Comments: difficulty staying focused on  task, easily distracted, able to state month/year and knows she's in a hospital, L sided neglect        Exercises General Exercises - Lower Extremity Long Arc Quad: AAROM;20 reps;Seated;Limitations;AROM;Both Long Arc Quad Limitations: AAROM Left, AROM R Hip Flexion/Marching: AROM;AAROM;Limitations;Both;20 reps;Seated Hip Flexion/Marching Limitations: AROM R, AAROM L    General Comments        Pertinent Vitals/Pain Pain Assessment: No/denies pain    Home Living                          Prior Function            PT Goals (current goals can now be found in the care plan section) Acute Rehab PT Goals Patient Stated Goal: to improve walking PT Goal Formulation: With patient Time For Goal Achievement: 04/10/21 Potential to Achieve Goals: Fair Progress towards PT goals: Progressing toward goals    Frequency    Min 2X/week      PT Plan Current plan remains appropriate    Co-evaluation              AM-PAC PT "6 Clicks" Mobility   Outcome Measure  Help needed turning from your back to your side while in a flat bed without using bedrails?: A Little Help needed moving from lying on your back to sitting on the side of a flat bed without using bedrails?: A Little Help needed moving to and from a bed to a chair (including a wheelchair)?: A Lot Help needed standing up from a chair using your arms (e.g., wheelchair or bedside chair)?: A Lot Help needed to walk in hospital room?: Total Help needed climbing 3-5 steps with a railing? : Total 6 Click Score: 12    End of Session Equipment Utilized During Treatment: Gait belt Activity Tolerance: Patient tolerated treatment well;No increased pain Patient left: in bed;with call bell/phone within reach;with bed alarm set Nurse Communication: Mobility status PT Visit Diagnosis: Difficulty in walking, not elsewhere classified (R26.2);History of falling (Z91.81)     Time: 2111-5520 PT Time Calculation (min) (ACUTE  ONLY): 23 min  Charges:  $Therapeutic Exercise: 8-22 mins $Therapeutic Activity: 8-22 mins                    Blondell Reveal Kistler PT 04/03/2021  Acute Rehabilitation Services Pager [phone] Office [phone]

## 2021-04-03 NOTE — TOC Progression Note (Signed)
Transition of Care [facility]) - Progression Note    Patient Details  Name: Brenda [name] MRN: [id number] Date of Birth: 12-27-1960  Transition of Care [facility]) CM/SW Contact  [place] Teters, Marjie Skiff, RN Phone Number: 04/03/2021, 1:48 PM  Clinical Narrative:    Contacted [name] via phone. Brenda [name] states that pt brother is now planning on taking pt to [place] to his home. Will follow up with pt brother.  TOC has requested that attending consult PMT again surrounding [place] now that palliative radiation is complete. Unsure if Hospice is appropriate. Pt could potentially benefit from Outpatient Palliative or Hospice services at dc.   Expected Discharge Plan: Skilled Nursing Facility Barriers to Discharge: Continued Medical Work up   Social Determinants of Health (SDOH) Interventions    Readmission Risk Interventions Readmission Risk Prevention Plan 03/14/2021 07/17/2020 04/11/2020  Transportation Screening Complete Complete Complete  PCP or Specialist Appt within 3-5 Days - - Complete  HRI or [place] - Complete Complete  Social Work Consult for [place] Planning/Counseling - Complete Complete  Palliative Care Screening - Not Applicable Not Applicable  Medication Review Press photographer) Complete Complete Complete  PCP or Specialist appointment within 3-5 days of discharge Complete - -  [place] or Home Care Consult Complete - -  SW Recovery Care/Counseling Consult Complete - -  Palliative Care Screening Complete - -  [place] Patient Refused - -  Some recent data might be hidden

## 2021-04-04 DIAGNOSIS — W19XXXA Unspecified fall, initial encounter: Secondary | ICD-10-CM | POA: Diagnosis not present

## 2021-04-04 DIAGNOSIS — C7931 Secondary malignant neoplasm of brain: Secondary | ICD-10-CM | POA: Diagnosis not present

## 2021-04-04 DIAGNOSIS — C3490 Malignant neoplasm of unspecified part of unspecified bronchus or lung: Secondary | ICD-10-CM | POA: Diagnosis not present

## 2021-04-04 LAB — COMPREHENSIVE METABOLIC PANEL
ALT: 36 U/L (ref 0–44)
AST: 15 U/L (ref 15–41)
Albumin: 2.8 g/dL — ABNORMAL LOW (ref 3.5–5.0)
Alkaline Phosphatase: 62 U/L (ref 38–126)
Anion gap: 8 (ref 5–15)
BUN: 83 mg/dL — ABNORMAL HIGH (ref 6–20)
CO2: 22 mmol/L (ref 22–32)
Calcium: 8.8 mg/dL — ABNORMAL LOW (ref 8.9–10.3)
Chloride: 107 mmol/L (ref 98–111)
Creatinine, Ser: 2.5 mg/dL — ABNORMAL HIGH (ref 0.44–1.00)
GFR, Estimated: 22 mL/min — ABNORMAL LOW (ref >60)
Glucose, Bld: 117 mg/dL — ABNORMAL HIGH (ref 70–99)
Potassium: 3.9 mmol/L (ref 3.5–5.1)
Sodium: 137 mmol/L (ref 135–145)
Total Bilirubin: 0.4 mg/dL (ref 0.3–1.2)
Total Protein: 6.3 g/dL — ABNORMAL LOW (ref 6.5–8.1)

## 2021-04-04 MED ORDER — SENNOSIDES-DOCUSATE SODIUM 8.6-50 MG PO TABS: 1.0000 | ORAL_TABLET | Freq: Every evening | ORAL | Status: DC | PRN

## 2021-04-04 MED ORDER — LOPERAMIDE HCL 2 MG PO CAPS
2.0000 mg | ORAL_CAPSULE | ORAL | Status: DC | PRN
  Administered 2021-04-04 (×2): 2 mg via ORAL
  Filled 2021-04-04 (×2): qty 1

## 2021-04-04 NOTE — TOC Progression Note (Addendum)
Transition of Care [facility]) - Progression Note    Patient Details  Name: Brenda [name] MRN: [id number] Date of Birth: September 21, 1960  Transition of Care [facility]) CM/SW Contact  Alexande Sheerin, Marjie Skiff, RN Phone Number: 04/04/2021, 10:12 AM  Clinical Narrative:    Spoke with brother in law Brenda [name] [phone] as TOC was told by pt son Brenda [name] that pt would be going home with him. Brenda [name] laughed when I asked him about taking pt home with him and said "Brenda [name] is in a bind, she has let her life insurance lapse". He said this statement about 5 times during our conversation.  Brenda [name] states that he is not willing to help Brenda [name].   Called boyfriend Brenda [name] who states that he thought her brother in law was going to take her and that he needed to call her niece Brenda [name].   TOC is still no closer to having a disposition plan for pt. Leadership made aware.   Expected Discharge Plan: Skilled Nursing Facility Barriers to Discharge: Continued Medical Work up  Expected Discharge Plan and Services Expected Discharge Plan: [place]   Discharge Planning Services: CM Consult   Living arrangements for the past 2 months: Single Family Home                           HH Arranged: PT, OT [facility] Agency: [place] Date [facility] Agency Contacted: 03/14/21 Time [place]: 1139 Representative spoke with at [place]: [place] ([place]) Interventions    Readmission Risk Interventions Readmission Risk Prevention Plan 03/14/2021 07/17/2020 04/11/2020  Transportation Screening Complete Complete Complete  PCP or Specialist Appt within 3-5 Days - - Complete  HRI or [place] - Complete Complete  Social Work Consult for [place] Planning/Counseling - Complete Complete  Palliative Care Screening - Not Applicable Not Applicable  Medication Review Press photographer) Complete Complete Complete  PCP or Specialist appointment within 3-5 days of discharge Complete  - -  [place] or Home Care Consult Complete - -  SW Recovery Care/Counseling Consult Complete - -  Palliative Care Screening Complete - -  [place] Patient Refused - -  Some recent data might be hidden

## 2021-04-04 NOTE — Progress Notes (Signed)
PROGRESS NOTE    Brenda [name]  MLY:[id number] DOB: 09/24/1960 DOA: 03/11/2021 PCP: Pcp, No    Brief Narrative:  61 y.o. female  stage IV T2A N2 M1B NSCLC lung CA presented with recurrent falls secondary to known metastatic lung cancer with brain mets, on decadron and on radiation tx  Assessment & Plan:   Principal Problem:   Primary malignant neoplasm of lung with metastasis to brain [facility]) Active Problems:   Major depressive disorder, recurrent episode with mood-congruent psychotic features ([place])   Alcohol abuse   Malignant neoplasm of right lung ([place])   Polysubstance abuse ([place])   Hypoalbuminemia   Acute on chronic combined systolic and diastolic congestive heart failure (HCC)   Protein-calorie malnutrition, severe  Recurrent falls with associated headache dizziness weakness and poor appetite:Deconditioning/debility debility Difficult placement: presentation is due to metastatic cancer. Cont on Decadron 4 mg Q8, supportive care, PT-OT who have recommending SNF. Disposition planning remains a challenge. TOC documentation reviewed.    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  Started whole brain radiation therapy for palliation Rad onc consulted-started XRT12/27/22-completed through 04/01/21.   Continue Decadron, supportive care.   Cont with analgesia as tolerated   Left pleural effusion in the setting of stage IV lung cancer doing well on room air-no further work-up needed   Chronic combined systolic and diastolic CHF with EF 81-27% G1 DD. remains euvolemic. Continue Coreg 6.25 bid.  Monitor weight   CKD stage IIIb renal function at baseline in twos.   Anemia of chronic disease/malignancy hemoglobin is stable monitor    Hyperkalemia   Resolved Cont to monitor potassium Lokelma has since been discontinued   Polysubstance abuse history of cocaine use Alcohol abuse: Cont on folic acid thiamine and alcohol /cocaine cessation counseling   Severe malnutrition BMI 15 augment diet as tolerated. Nutrition Problem: Severe Malnutrition Etiology: chronic illness, cancer and cancer related treatments Signs/Symptoms: percent weight loss, energy intake < or equal to 75% for > or equal to 1 month, mild fat depletion, moderate muscle depletion Interventions: Ensure Enlive (each supplement provides 350kcal and 20 grams of protein), MVI   DVT prophylaxis: Heparin subq Code Status: Full Family Communication: Pt in room, family not at bedside  Status is: Inpatient  Remains inpatient appropriate because: Severity of illness and disposition issues   Consultants:  Rad Onc Palliative Care  Procedures:    Antimicrobials: Anti-infectives (From admission, onward)    None       Subjective: Feels well today. Reports diarrhea after drinking milk yesterday, self-limiting. Later reports hx of lactose intolerance to milk and even ice cream  Objective: Vitals:   04/03/21 1420 04/03/21 2120 04/04/21 0627 04/04/21 1426  BP: 132/82 (!) 144/85 126/81 128/85  Pulse: 89 85 81 94  Resp: 18 16 14 16   Temp: 98 F (36.7 C) 98.5 F (36.9 C) 97.9 F (36.6 C) 98.3 F (36.8 C)  TempSrc:  Oral Oral Oral  SpO2: 100% 99% 99% 100%  Weight:      Height:        Intake/Output Summary (Last 24 hours) at 04/04/2021 1550 Last data filed at 04/03/2021 2126 Gross per 24 hour  Intake --  Output 500 ml  Net -500 ml    Filed Weights   03/11/21 1432 03/12/21 0430  Weight: 56.7 kg 43.8 kg  Examination: General exam: Awake, laying in bed, in nad Respiratory system: Normal respiratory effort, no wheezing Cardiovascular system: regular rate, s1, s2 Gastrointestinal system: Soft, nondistended, positive BS Central nervous  system: CN2-12 grossly intact, strength intact Extremities: Perfused, no clubbing Skin: Normal skin turgor, no notable skin lesions seen Psychiatry: Mood normal // no visual hallucinations   Data Reviewed: I have personally reviewed following labs and imaging studies  CBC: Recent Labs  Lab 03/29/21 0614  WBC 5.6  NEUTROABS 4.2  HGB 8.9*  HCT 27.6*  MCV 105.7*  PLT 326    Basic Metabolic Panel: Recent Labs  Lab 03/29/21 0614 04/01/21 0930 04/03/21 0457 04/04/21 0802  NA 137 135 136 137  K 5.4* 4.9 4.6 3.9  CL 104 106 106 107  CO2 25 22 23 22   GLUCOSE 113* 137* 130* 117*  BUN 87* 84* 87* 83*  CREATININE 2.48* 2.58* 2.70* 2.50*  CALCIUM 8.8* 8.4* 8.6* 8.8*    GFR: Estimated Creatinine Clearance: 17 mL/min (A) (by C-G formula based on SCr of 2.5 mg/dL (H)). Liver Function Tests: Recent Labs  Lab 04/03/21 0457 04/04/21 0802  AST 17 15  ALT 44 36  ALKPHOS 57 62  BILITOT 0.5 0.4  PROT 6.1* 6.3*  ALBUMIN 2.7* 2.8*    No results for input(s): LIPASE, AMYLASE in the last 168 hours. No results for input(s): AMMONIA in the last 168 hours. Coagulation Profile: No results for input(s): INR, PROTIME in the last 168 hours. Cardiac Enzymes: No results for input(s): CKTOTAL, CKMB, CKMBINDEX, TROPONINI in the last 168 hours. BNP (last 3 results) No results for input(s): PROBNP in the last 8760 hours. HbA1C: No results for input(s): HGBA1C in the last 72 hours. CBG: No results for input(s): GLUCAP in the last 168 hours. Lipid Profile: No results for input(s): CHOL, HDL, LDLCALC, TRIG, CHOLHDL, LDLDIRECT in the last 72 hours. Thyroid Function Tests: No results for input(s): TSH, T4TOTAL, FREET4, T3FREE, THYROIDAB in the last 72 hours. Anemia Panel: No results for input(s): VITAMINB12, FOLATE, FERRITIN, TIBC, IRON, RETICCTPCT in the last 72 hours. Sepsis Labs: No results for input(s): PROCALCITON, LATICACIDVEN in the last 168 hours.  No results found for this or any  previous visit (from the past 240 hour(s)).   Radiology Studies: No results found.  Scheduled Meds:  carvedilol  6.25 mg Oral BID   Chlorhexidine Gluconate Cloth  6 each Topical Daily   dexamethasone  4 mg Oral Q8H   feeding supplement  237 mL Oral TID BM   ferrous sulfate  325 mg Oral BID WC   gabapentin  300 mg Oral QHS   heparin  5,000 Units Subcutaneous Q8H   melatonin  3 mg Oral QHS   multivitamin with minerals  1 tablet Oral Daily   nicotine  21 mg Transdermal Daily   pantoprazole  40 mg Oral BID   polyethylene glycol  17 g Oral BID   thiamine  100 mg Oral Daily   Continuous Infusions:   LOS: 24 days   Marylu Lund, MD [facility] Pager On Amion  If 7PM-7AM, please contact night-coverage 04/04/2021, 3:50 PM

## 2021-04-04 NOTE — Progress Notes (Signed)
Orthopedic Tech Progress Note Patient Details:  Brenda [name] 08/26/1960 [id number]  Ortho Devices Type of Ortho Device: Velcro wrist splint Ortho Device/Splint Location: LUE Ortho Device/Splint Interventions: Application   Post Interventions Patient Tolerated: Well  Linus Salmons Kierra Jezewski 04/04/2021, 2:01 PM

## 2021-04-04 NOTE — Plan of Care (Signed)
°  Problem: Education: Goal: Knowledge of General Education information will improve Description: Including pain rating scale, medication(s)/side effects and non-pharmacologic comfort measures Outcome: Progressing   Problem: Health Behavior/Discharge Planning: Goal: Ability to manage health-related needs will improve Outcome: Progressing   Problem: Clinical Measurements: Goal: Ability to maintain clinical measurements within normal limits will improve Outcome: Progressing Goal: Will remain free from infection Outcome: Progressing Goal: Diagnostic test results will improve Outcome: Progressing   Problem: Activity: Goal: Risk for activity intolerance will decrease Outcome: Progressing   Problem: Nutrition: Goal: Adequate nutrition will be maintained Outcome: Progressing   Problem: Coping: Goal: Level of anxiety will decrease Outcome: Progressing   Problem: Pain Managment: Goal: General experience of comfort will improve Outcome: Progressing   Problem: Safety: Goal: Ability to remain free from injury will improve Outcome: Progressing   Problem: Skin Integrity: Goal: Risk for impaired skin integrity will decrease Outcome: Progressing

## 2021-04-04 NOTE — Progress Notes (Addendum)
Occupational Therapy Treatment Patient Details Name: Brenda [name] MRN: [id number] DOB: Jun 24, 1960 Today's Date: 04/04/2021   History of present illness 61 years old female adm with AMS/confusion and falls. 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  Large cystic mass in the right occipital parietal lobe with prior  hemorrhage and local mass-effect. Multiple additional metastatic  lesions as above".  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 Pt refused recommended SNF level rehab following last hospital admit.   OT comments  Able to get patient to [facility] today with max assist x 2. She continues to still be very impulsive and floppy. She was total care for toileting. Patient to perform LUE movement in supine - with active assist from therapist with some muscle tapping. Grossly 2-/5 movement in left UE today. Able to grossly open and close fingers without assist. Recommend wrist cock up splint to provide wrist support and improve functional use of left hand. Patient exhibited improved attention to left side and able to follow most commands. Continue POC. If patient goes home with family recommend hospital bed and wheelchair. At this time +2 for transfers.    Recommendations for follow up therapy are one component of a multi-disciplinary discharge planning process, led by the attending physician.  Recommendations may be updated based on patient status, additional functional criteria and insurance authorization.    Follow Up Recommendations  Skilled nursing-short term rehab (<3 hours/day)    Assistance Recommended at Discharge Frequent or constant Supervision/Assistance  Patient can return home with the following  Two people to help with walking and/or transfers;Two people to help with bathing/dressing/bathroom;Direct supervision/assist for medications  management;Assist for transportation;Assistance with cooking/housework;Direct supervision/assist for financial management   Equipment Recommendations  Hospital bed;Wheelchair (measurements OT)    Recommendations for Other Services      Precautions / Restrictions Precautions Precautions: Fall Precaution Comments: left hemiparesis, left neglect, inattention Restrictions Weight Bearing Restrictions: No          Balance Overall balance assessment: Needs assistance Sitting-balance support: No upper extremity supported Sitting balance-Leahy Scale: Fair Sitting balance - Comments: can maintain balance but unsafe     Standing balance-Leahy Scale: Zero                             ADL either performed or assessed with clinical judgement   ADL Overall ADL's : Needs assistance/impaired                         Toilet Transfer: BSC/3in1;+2 for physical assistance;Maximal assistance Toilet Transfer Details (indicate cue type and reason): to transfer to [facility] and then back to bed. Toileting- Clothing Manipulation and Hygiene: +2 for physical assistance;Total assistance Toileting - Clothing Manipulation Details (indicate cue type and reason): for clothing management and pericare.     Functional mobility during ADLs: Total assistance;+2 for physical assistance      Extremity/Trunk Assessment Upper Extremity Assessment LUE Deficits / Details: AAROm today to flex shoulder, flex/extend elbow, extend wrist - grossly 2-/5 with MMT; able to grossly open and close fingers. LUE Sensation: decreased light touch (reports numbness in fingers) LUE Coordination: decreased fine motor;decreased gross motor            Vision   Vision Assessment?: No apparent visual deficits   Perception     Praxis  Cognition Arousal/Alertness: Awake/alert Behavior During Therapy: Impulsive Overall Cognitive Status: No family/caregiver present to determine baseline cognitive  functioning Area of Impairment: Attention;Memory;Following commands;Safety/judgement;Awareness;Problem solving                   Current Attention Level: Selective Memory: Decreased recall of precautions;Decreased short-term memory Following Commands: Follows one step commands consistently Safety/Judgement: Decreased awareness of safety;Decreased awareness of deficits Awareness: Emergent Problem Solving: Requires verbal cues;Requires tactile cues;Decreased initiation;Difficulty sequencing;Slow processing General Comments: improving ability to stay focused and follow commands though she is still impulsive. More attention to left side and deficits today.          Exercises     Shoulder Instructions  LUE supported with gait belt during transfer back to bed to reduce hanging at glenohumeral joint.     General Comments      Pertinent Vitals/ Pain       Pain Assessment: Faces Faces Pain Scale: Hurts little more Pain Location: buttocks, stomach Pain Descriptors / Indicators: Grimacing Pain Intervention(s): Monitored during session   Frequency  Min 2X/week        Progress Toward Goals  OT Goals(current goals can now be found in the care plan section)  Progress towards OT goals: Progressing toward goals  Acute Rehab OT Goals Patient Stated Goal: get left hand better OT Goal Formulation: With patient Time For Goal Achievement: 04/18/21 Potential to Achieve Goals: [place] Discharge plan remains appropriate    Co-evaluation                 AM-PAC OT "6 Clicks" Daily Activity     Outcome Measure   Help from another person eating meals?: A Little Help from another person taking care of personal grooming?: A Little Help from another person toileting, which includes using toliet, bedpan, or urinal?: Total Help from another person bathing (including washing, rinsing, drying)?: A Lot Help from another person to put on and taking off regular upper body  clothing?: A Lot Help from another person to put on and taking off regular lower body clothing?: A Lot 6 Click Score: 13    End of Session Equipment Utilized During Treatment: Rolling walker (2 wheels)  OT Visit Diagnosis: Unsteadiness on feet (R26.81);Hemiplegia and hemiparesis;History of falling (Z91.81);Repeated falls (R29.6);Muscle weakness (generalized) (M62.81);Other symptoms and signs involving cognitive function;Apraxia (R48.2);Other symptoms and signs involving the nervous system (R29.898) Hemiplegia - Right/Left: Left Hemiplegia - dominant/non-dominant: Non-Dominant Hemiplegia - caused by: Unspecified   Activity Tolerance Patient tolerated treatment well   Patient Left in bed;with call bell/phone within reach;with bed alarm set   Nurse Communication Mobility status        Time: 3491-7915 OT Time Calculation (min): 23 min  Charges: OT General Charges $OT Visit: 1 Visit OT Treatments $Self Care/Home Management : 8-22 mins $Neuromuscular Re-education: 8-22 mins  Khrista Braun, OTR/L [place]  Office [phone] Pager: [place] 04/04/2021, 3:16 PM

## 2021-04-05 DIAGNOSIS — C3490 Malignant neoplasm of unspecified part of unspecified bronchus or lung: Secondary | ICD-10-CM | POA: Diagnosis not present

## 2021-04-05 DIAGNOSIS — C7931 Secondary malignant neoplasm of brain: Secondary | ICD-10-CM | POA: Diagnosis not present

## 2021-04-05 MED ORDER — CARVEDILOL 6.25 MG PO TABS: 6.2500 mg | ORAL_TABLET | Freq: Two times a day (BID) | ORAL | 0 refills | Status: DC

## 2021-04-05 MED ORDER — LEVETIRACETAM 500 MG PO TABS: 500.0000 mg | ORAL_TABLET | Freq: Two times a day (BID) | ORAL | 0 refills | Status: DC

## 2021-04-05 MED ORDER — DEXAMETHASONE 4 MG PO TABS: 4.0000 mg | ORAL_TABLET | Freq: Three times a day (TID) | ORAL | 0 refills | Status: DC

## 2021-04-05 NOTE — Progress Notes (Signed)
Transport on the floor for patient.  Patient discharged home.  Son made aware of pick up time and approximate arrival.

## 2021-04-05 NOTE — Progress Notes (Signed)
Talked to patient's son Daryel November and gave discharge instructions over the phone.  Went through patient's medications and new Rx.  Reiterated [place] set up with [facility] by LSW/CM.  Patient's son verbalized understanding.  Waiting for transport at this time.

## 2021-04-05 NOTE — TOC Transition Note (Signed)
Transition of Care [facility]) - CM/SW Discharge Note   Patient Details  Name: Brenda [name] MRN: [id number] Date of Birth: 1960-05-05  Transition of Care [facility]) CM/SW Contact:  Illene Regulus, LCSW Phone Number: 04/05/2021, 1:31 PM   Clinical Narrative:     CSW spoke with pt's son Daryel November [phone]) he stated pt can d/c to his home.CSW informed pt's son of pts many needs , pt's sone verbalized understanding. Pt's son reported his GF and uncle stay in the home and will help pt while he is at work. Pt is followed by [facility]. CSW will set up EMS transport to son's home [address] [place]. [place] [address]. Pt son is requesting 3 in 1, CSW sent referral to [facility] , DME will be delivered to son's home Monday. CSW to set up transportation for pt.    Barriers to Discharge: Continued Medical Work up   Patient Goals and CMS Choice Patient states their goals for this hospitalization and ongoing recovery are:: To go home      Discharge Placement                       Discharge Plan and Services   Discharge Planning Services: CM Consult                      HH Arranged: PT, OT HH Agency: [facility] Date [place]: 03/14/21 Time [place]: 1139 Representative spoke with at [place]: [place] Interventions     Readmission Risk Interventions Readmission Risk Prevention Plan 03/14/2021 07/17/2020 04/11/2020  Transportation Screening Complete Complete Complete  PCP or Specialist Appt within 3-5 Days - - Complete  HRI or [place] - Complete Complete  Social Work Consult for [place] Planning/Counseling - Complete Complete  Palliative Care Screening - Not Applicable Not Applicable  Medication Review Press [employer]) Complete Complete Complete  PCP or Specialist appointment within 3-5 days of discharge Complete - -  [place] or Home Care Consult Complete - -  SW Recovery  Care/Counseling Consult Complete - -  Palliative Care Screening Complete - -  [place] Patient Refused - -  Some recent data might be hidden

## 2021-04-05 NOTE — Discharge Summary (Signed)
Physician Discharge Summary  Brenda [name] FMB:[id number] DOB: 1960/05/01 DOA: 03/11/2021  PCP: Merryl Hacker, No  Admit date: 03/11/2021 Discharge date: 04/05/2021  Admitted From: Home Disposition:  Home  Recommendations for Outpatient Follow-up:  Follow up with PCP in 1-2 weeks Follow up with Oncology as scheduled  Home Health:PT, OT  Equipment/Devices:3 in 1    Discharge Condition:Stable CODE STATUS:Full Diet recommendation: Regular   Brief/Interim Summary: 61 y.o. 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 Pt had declined brain surgery. She was given decadron and on radiation tx while in hospital  Discharge Diagnoses:  Principal Problem:   Primary malignant neoplasm of lung with metastasis to brain [facility]) Active Problems:   Major depressive disorder, recurrent episode with mood-congruent psychotic features ([place])   Alcohol abuse   Malignant neoplasm of right lung (HCC)   Polysubstance abuse ([place])   Hypoalbuminemia   Acute on chronic combined systolic and diastolic congestive heart failure (HCC)   Protein-calorie malnutrition, severe  Recurrent falls with associated headache dizziness weakness and poor appetite:Deconditioning/debility debility presentation is due to metastatic cancer. 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  Rad onc consulted-pt underwent XRT12/27/22-completed through 04/01/21.   Continued Decadron, supportive care.    Keppra was prescribed for seizure prophylaxis   Left pleural effusion in the setting of stage IV lung cancer doing well on room air   Chronic combined systolic and diastolic CHF with EF 70-17% G1 DD. remained euvolemic. Continued Coreg 6.25 bid.    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 Nutrition Problem: Severe Malnutrition Etiology: chronic illness, cancer and cancer related treatments Signs/Symptoms: percent weight loss, energy intake < or equal to 75% for > or equal to 1 month, mild fat depletion, moderate muscle depletion Interventions: Ensure Enlive (each supplement provides 350kcal and 20 grams of protein), MVI    Discharge Instructions   Allergies as of 04/05/2021       Reactions   Aspirin Adult Low [aspirin] Other (See Comments)   Stomach upset        Medication List     TAKE these medications    acetaminophen 325 MG tablet Commonly known as: TYLENOL Take 2 tablets (650 mg total) by mouth every 6 (six) hours as needed for mild pain, moderate pain or headache.   carvedilol 6.25 MG tablet Commonly known as: COREG Take 1 tablet (6.25 mg total) by mouth 2 (two) times daily. What changed:  medication strength how much to take when to take this   dexamethasone 4 MG tablet Commonly known as: DECADRON Take 1 tablet (4 mg total) by mouth every 8 (eight) hours.   FeroSul 325 (65 FE) MG tablet Generic drug: ferrous sulfate TAKE 1 TABLET (325 MG  TOTAL) BY MOUTH 2 (TWO) TIMES DAILY WITH A MEAL.   folic acid 1 MG tablet Commonly known as: FOLVITE TAKE 1 TABLET (1 MG TOTAL) BY MOUTH DAILY.   gabapentin 300 MG capsule Commonly known as:  NEURONTIN Take 1 capsule (300 mg total) by mouth at bedtime.   levETIRAcetam 500 MG tablet Commonly known as: Keppra Take 1 tablet (500 mg total) by mouth 2 (two) times daily.   pantoprazole 40 MG tablet Commonly known as: PROTONIX Take 1 tablet (40 mg total) by mouth 2 (two) times daily.   polyethylene glycol 17 g packet Commonly known as: MIRALAX / GLYCOLAX Dissolve 1 packet (17 g) in liquid and drink 2 (two) times daily.   thiamine 100 MG tablet Take 1 tablet (100 mg total) by mouth daily.               Durable Medical Equipment  (From admission, onward)           Start     Ordered   04/05/21 1408  For home use only DME 3 n 1  Once        04/05/21 1407            Allergies  Allergen Reactions   Aspirin Adult Low [Aspirin] Other (See Comments)    Stomach upset    Consultations: Rad Onc Palliative Care  Procedures/Studies: DG Chest 2 View  Result Date: 03/11/2021 CLINICAL DATA:  Fall. Additional history provided: Patient reports not eating for 4 days, 2 falls over the past 2 days, left-sided rib pain and headache. EXAM: CHEST - 2 VIEW COMPARISON:  Prior chest radiographs 08/15/2020 and earlier. Chest CT 05/21/2020 FINDINGS: A right chest infusion port catheter is present with tip projecting at the level of the lower SVC. The cardiac silhouette is partially obscured, limiting evaluation of heart size. Moderate-sized left pleural effusion with associated left basilar atelectasis and/or consolidation. A partially calcified right upper lobe mass appears increased in size as compared to the prior chest radiograph of 08/15/2020, now measuring at least 3.4 cm. A smaller nodule within the medial right upper lobe also appears more conspicuous. No evidence of pneumothorax. No acute bony abnormality is identified. However, correlate with findings on concurrently performed radiographs of the left ribs. IMPRESSION: A right upper lobe partially calcified mass appears  increased in size as compared to the prior chest radiograph 08/15/2020, now measuring at least 3.4 cm. A smaller nodule within the medial right upper lobe also appears more conspicuous. A contrast-enhanced chest CT may be obtained for further evaluation, as clinically warranted. Moderate-sized left pleural effusion with associated left basilar atelectasis and/or consolidation. Electronically Signed   By: Kellie Simmering D.O.   On: 03/11/2021 15:44   DG Ribs Unilateral W/Chest Left  Result Date: 03/11/2021 CLINICAL DATA:  Provided history: Fall. EXAM: LEFT RIBS AND CHEST - 3+ VIEW COMPARISON:  Same day chest radiographs 03/11/2021. Prior chest radiographs 08/15/2020 and earlier. FINDINGS: No acute displaced left rib fracture is identified. There are multiple known chronic, healed left-sided rib fracture deformities. Please refer to the concurrently performed chest radiographs for a description of intrathoracic findings. IMPRESSION: No acute, displaced left rib fracture is identified. A CT of the thorax may be obtained for further evaluation, if clinically warranted. Electronically Signed   By: Kellie Simmering D.O.   On: 03/11/2021 15:59   CT Head Wo Contrast  Result Date: 03/11/2021 CLINICAL DATA:  Trauma, history of malignancy per the CT technologist. EXAM: CT HEAD WITHOUT CONTRAST  TECHNIQUE: Contiguous axial images were obtained from the base of the skull through the vertex without intravenous contrast. COMPARISON:  Brain MRI 08/10/2020 FINDINGS: Brain: There is a new large solid and cystic lesion centered in the right centrum semiovale measuring 6.4 cm AP x 3.9 cm TV by 6.0 cm cc. There is a smaller new solid and cystic lesion in the right frontal lobe measuring 1.9 cm AP by 1.8 cm TV by 1.8 cm cc. The larger lesion exerts regional mass effect with mild perilesional edema resulting in partial effacement of the right lateral ventricle and effacement of the surrounding sulci. There is no measurable midline  shift. No other definite lesions are seen. There is no evidence of acute intracranial hemorrhage or extra-axial fluid collection. 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 Vascular: No hyperdense vessel or unexpected calcification. Skull: Normal. Negative for fracture or focal lesion. Sinuses/Orbits: There is mucosal thickening in the left maxillary sinus, incompletely imaged. The globes and orbits are unremarkable. Other: None. IMPRESSION: Two solid and cystic lesions in the right cerebral hemisphere, the largest measuring up to 6.4 cm, are new since 08/10/2020, concerning for metastatic disease. There is regional mass effect caused by the larger lesion with partial effacement of the right lateral ventricle but no midline shift. Brain MRI with and without contrast is recommended for further characterization. These results were called by telephone at the time of interpretation on 03/11/2021 at 3:59 pm to provider Dr Karle Starch, who verbally acknowledged these results. Electronically Signed   By: Valetta Mole M.D.   On: 03/11/2021 16:00   MR BRAIN W WO CONTRAST  Result Date: 03/11/2021 CLINICAL DATA:  Lung cancer. Abnormal CT head today. Rule out metastatic disease. EXAM: MRI HEAD WITHOUT AND WITH CONTRAST TECHNIQUE: Multiplanar, multiecho pulse sequences of the brain and surrounding structures were obtained without and with intravenous contrast. CONTRAST:  39mL GADAVIST GADOBUTROL 1 MMOL/ML IV SOLN COMPARISON:  CT head 03/11/2021 FINDINGS: Brain: Motion degraded study Multiple enhancing lesions compatible with metastatic disease. 6.6 x 3.7 cm cystic mass in the right occipital parietal lobe. Much of this is cystic however there is enhancing tumor around the periphery of the mass as well as hemorrhage within the lesion. Mild surrounding edema and local mass-effect. 20 mm cystic and enhancing lesion in the right frontal lobe with mild edema and mild hemorrhage. This also is consistent with metastatic disease.  Small focus of restricted diffusion right anterior temporal lobe without definite enhancement. This could be a recent infarct versus metastatic disease. Enhancement could be obscured by motion. 6 mm enhancing lesion left frontal lobe consistent metastatic disease. See sagittal image 18 6 mm lesions in the left lateral frontal lobe and left operculum consistent with metastatic disease. Sagittal image 21. Possible 3 mm enhancing lesion left temporal lobe. 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 Negative for hydrocephalus. Mild midline shift to the left. Vascular: Normal arterial flow voids Skull and upper cervical spine: No skeletal metastasis. Contusion left frontal scalp from trauma. Sinuses/Orbits: Mucosal edema in the paranasal sinuses most prominent left maxillary sinus. Negative orbit Other: None IMPRESSION: Findings consistent with widespread metastatic disease to brain. Large cystic mass in the right occipital parietal lobe with prior hemorrhage and local mass-effect. Multiple additional metastatic lesions as above. Left frontal scalp contusion compatible with recent trauma. Electronically Signed   By: Franchot Gallo M.D.   On: 03/11/2021 19:37   CT CHEST ABDOMEN PELVIS WO CONTRAST  Result Date: 03/11/2021 CLINICAL DATA:  Metastatic disease evaluation. EXAM: CT CHEST, ABDOMEN AND PELVIS WITHOUT CONTRAST TECHNIQUE: Multidetector CT imaging of the chest, abdomen and pelvis was performed following the standard protocol without IV contrast. COMPARISON:  CT dated 05/21/2020. FINDINGS: Evaluation of this exam is limited in the absence of intravenous contrast. CT CHEST FINDINGS Cardiovascular: No cardiomegaly or pericardial effusion. There is coronary vascular calcification. Right-sided Port-A-Cath with tip at the cavoatrial junction. The thoracic aorta and central pulmonary arteries are grossly unremarkable on this noncontrast CT. Mediastinum/Nodes: No hilar or mediastinal adenopathy.  The esophagus is grossly unremarkable. No mediastinal fluid collection. Lungs/Pleura: Moderate size left pleural effusion slightly increased since the prior CT. There is partial compressive atelectasis of the left lung versus pneumonia. Background of emphysema. Interval increase in the size of the dominant right upper lobe mass measuring 3 x 3 cm (previously 2 x 2 cm). A 1.2 x 0.8 cm nodular density with spiculated margins adjacent to the dominant mass is similar to prior CT. Additionally there is a 1.9 x 2.7 cm pleural based nodule with irregular/spiculated margin from the posteromedial right upper lobe similar or minimally increased since the prior CT. There is trace right pleural effusion. No pneumothorax. The central airways are patent. Musculoskeletal: Old healed bilateral posterior rib fractures. No acute osseous pathology. CT ABDOMEN PELVIS FINDINGS No intra-abdominal free air or free fluid. Hepatobiliary: The liver is unremarkable. No intrahepatic biliary dilatation. Multiple gallstones. No pericholecystic fluid or evidence of acute cholecystitis by CT. Pancreas: Unremarkable. No pancreatic ductal dilatation or surrounding inflammatory changes. Spleen: Normal in size without focal abnormality. Adrenals/Urinary Tract: The adrenal glands unremarkable. The kidneys, visualized ureters, and urinary bladder appear unremarkable. Stomach/Bowel: There is no bowel obstruction or active inflammation. The appendix is normal. Vascular/Lymphatic: Moderate aortoiliac atherosclerotic disease. The IVC is unremarkable. No portal venous gas. There is no adenopathy. Reproductive: The uterus and ovaries are grossly unremarkable. No pelvic masses Other: Several small fat containing supraumbilical hernia. Musculoskeletal: Osteopenia.  No acute osseous pathology. IMPRESSION: 1. Interval increase in the size of the dominant right upper lobe mass. 2. Slight interval increase in the size of the pleural based nodule in the  posteromedial right upper lobe. 3. Moderate size left pleural effusion slightly increased since the prior CT. There is partial compressive atelectasis of the left lung versus pneumonia. 4. Cholelithiasis. 5. No evidence of metastatic disease in the abdomen or pelvis. 6. Aortic Atherosclerosis (ICD10-I70.0) and Emphysema (ICD10-J43.9). Electronically Signed   By: Anner Crete M.D.   On: 03/11/2021 22:25    Subjective: Eager to go home  Discharge Exam: Vitals:   04/04/21 2013 04/05/21 0631  BP: (!) 142/83 (!) 146/89  Pulse: 97 80  Resp: 17 17  Temp: 98.7 F (37.1 C) 98 F (36.7 C)  SpO2: 97% 99%   Vitals:   04/04/21 0627 04/04/21 1426 04/04/21 2013 04/05/21 0631  BP: 126/81 128/85 (!) 142/83 (!) 146/89  Pulse: 81 94 97 80  Resp: 14 16 17 17   Temp: 97.9 F (36.6 C) 98.3 F (36.8 C) 98.7 F (37.1 C) 98 F (36.7 C)  TempSrc: Oral Oral Oral Oral  SpO2: 99% 100% 97% 99%  Weight:      Height:        General: Pt is alert, awake, not in acute distress Cardiovascular: RRR, S1/S2 + Respiratory: CTA bilaterally, no wheezing, no rhonchi Abdominal: Soft, NT, ND, bowel sounds + Extremities: no edema, no cyanosis   The results of significant diagnostics from this hospitalization (including imaging, microbiology, ancillary and  laboratory) are listed below for reference.     Microbiology: No results found for this or any previous visit (from the past 240 hour(s)).   Labs: BNP (last 3 results) No results for input(s): BNP in the last 8760 hours. Basic Metabolic Panel: Recent Labs  Lab 04/01/21 0930 04/03/21 0457 04/04/21 0802  NA 135 136 137  K 4.9 4.6 3.9  CL 106 106 107  CO2 22 23 22   GLUCOSE 137* 130* 117*  BUN 84* 87* 83*  CREATININE 2.58* 2.70* 2.50*  CALCIUM 8.4* 8.6* 8.8*   Liver Function Tests: Recent Labs  Lab 04/03/21 0457 04/04/21 0802  AST 17 15  ALT 44 36  ALKPHOS 57 62  BILITOT 0.5 0.4  PROT 6.1* 6.3*  ALBUMIN 2.7* 2.8*   No results for  input(s): LIPASE, AMYLASE in the last 168 hours. No results for input(s): AMMONIA in the last 168 hours. CBC: No results for input(s): WBC, NEUTROABS, HGB, HCT, MCV, PLT in the last 168 hours. Cardiac Enzymes: No results for input(s): CKTOTAL, CKMB, CKMBINDEX, TROPONINI in the last 168 hours. BNP: Invalid input(s): POCBNP CBG: No results for input(s): GLUCAP in the last 168 hours. D-Dimer No results for input(s): DDIMER in the last 72 hours. Hgb A1c No results for input(s): HGBA1C in the last 72 hours. Lipid Profile No results for input(s): CHOL, HDL, LDLCALC, TRIG, CHOLHDL, LDLDIRECT in the last 72 hours. Thyroid function studies No results for input(s): TSH, T4TOTAL, T3FREE, THYROIDAB in the last 72 hours.  Invalid input(s): FREET3 Anemia work up No results for input(s): VITAMINB12, FOLATE, FERRITIN, TIBC, IRON, RETICCTPCT in the last 72 hours. Urinalysis    Component Value Date/Time   COLORURINE STRAW (A) 08/09/2020 1628   APPEARANCEUR CLEAR 08/09/2020 1628   LABSPEC 1.002 (L) 08/09/2020 1628   PHURINE 7.0 08/09/2020 1628   GLUCOSEU NEGATIVE 08/09/2020 1628   HGBUR SMALL (A) 08/09/2020 1628   BILIRUBINUR NEGATIVE 08/09/2020 1628   KETONESUR NEGATIVE 08/09/2020 1628   PROTEINUR NEGATIVE 08/09/2020 1628   UROBILINOGEN 0.2 05/25/2012 1958   NITRITE NEGATIVE 08/09/2020 1628   LEUKOCYTESUR NEGATIVE 08/09/2020 1628   Sepsis Labs Invalid input(s): PROCALCITONIN,  WBC,  LACTICIDVEN Microbiology No results found for this or any previous visit (from the past 240 hour(s)).  Time spent: 30 min  SIGNED:   Marylu Lund, MD  [facility] 04/05/2021, 2:08 PM  If 7PM-7AM, please contact night-coverage

## 2021-04-05 NOTE — Progress Notes (Signed)
Occupational Therapy Treatment Patient Details Name: Brenda [name] MRN: [id number] DOB: 1961-01-08 Today's Date: 04/05/2021   History of present illness 61 years old female adm with AMS/confusion and falls. 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  Large cystic mass in the right occipital parietal lobe with prior  hemorrhage and local mass-effect. Multiple additional metastatic  lesions as above".  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 Pt refused recommended SNF level rehab following last hospital admit.   OT comments  Treatment focused on splint check to assess for skin irritation and to reiterate with nursing and patient wear schedule. Patient wore splint all night with no irritation or redness noted. Therapist reiterated she could take it off at night or intermittently throughout the day. Recommended splint use for functional hand use to maintain wrist neutral position and allow grasp and release. With splint off patient able to perform wrist extension with active assist. Exhibits improving ability to focus on LUE and activate muscles with tapping and cues. Improving participation. Did very well with bilateral hand exercise with rod. Improving participation and understanding of deficits. Cont POC.    Recommendations for follow up therapy are one component of a multi-disciplinary discharge planning process, led by the attending physician.  Recommendations may be updated based on patient status, additional functional criteria and insurance authorization.    Follow Up Recommendations  Skilled nursing-short term rehab (<3 hours/day)    Assistance Recommended at Discharge Frequent or constant Supervision/Assistance  Patient can return home with the following  Two people to help with walking and/or transfers;Two people to help with  bathing/dressing/bathroom;Direct supervision/assist for medications management;Assist for transportation;Assistance with cooking/housework;Direct supervision/assist for financial management   Equipment Recommendations  Hospital bed;Wheelchair (measurements OT)    Recommendations for Other Services      Precautions / Restrictions Precautions Precautions: Fall Precaution Comments: left hemiparesis, left neglect, inattention Restrictions Weight Bearing Restrictions: No       Mobility Bed Mobility                    Transfers                         Balance                                           ADL either performed or assessed with clinical judgement   ADL Overall ADL's : Needs assistance/impaired     Grooming: Wash/dry face;Bed level Grooming Details (indicate cue type and reason): Performed HOH with left hand to wash face working on attention to LUE and activation of muscle group. Over compensated with face movement instead of arm movement - but patient performed task.                                    Extremity/Trunk Assessment              Vision       Perception     Praxis      Cognition Arousal/Alertness: Awake/alert Behavior During Therapy: WFL for tasks assessed/performed Overall Cognitive Status: Within Functional Limits for tasks assessed  Exercises Other Exercises Other Exercises: performed wrist extension reps with arm positioned in a way to reduce gravity - x 10 with muscle tapping. [place] flexion/extension with min active assist working on bringing hand to mouth/nose x 10 reps Other Exercises: splint check to look for skin irritation. None noted. Educated on wear schedule. Other Exercises: Bilateral hand task working on grasp and wrist extension holding on to narrow pole with balloon. Able to perform x 10 with active assist. Attempted  supination without active assist to bring balloon (on plastic rod) in to air - but patient could not do though she was attempting. Narrow rod limits grasp.   Shoulder Instructions       General Comments      Pertinent Vitals/ Pain       Pain Assessment: No/denies pain  Home Living                                          Prior Functioning/Environment              Frequency  Min 2X/week        Progress Toward Goals  OT Goals(current goals can now be found in the care plan section)  Progress towards OT goals: Progressing toward goals  Acute Rehab OT Goals Patient Stated Goal: get left hand better OT Goal Formulation: With patient Time For Goal Achievement: 04/18/21 Potential to Achieve Goals: [place] Discharge plan remains appropriate    Co-evaluation                 AM-PAC OT "6 Clicks" Daily Activity     Outcome Measure   Help from another person eating meals?: A Little Help from another person taking care of personal grooming?: A Little Help from another person toileting, which includes using toliet, bedpan, or urinal?: Total Help from another person bathing (including washing, rinsing, drying)?: A Lot Help from another person to put on and taking off regular upper body clothing?: A Lot Help from another person to put on and taking off regular lower body clothing?: A Lot 6 Click Score: 13    End of Session    OT Visit Diagnosis: Unsteadiness on feet (R26.81);Hemiplegia and hemiparesis;History of falling (Z91.81);Repeated falls (R29.6);Muscle weakness (generalized) (M62.81);Other symptoms and signs involving cognitive function;Apraxia (R48.2);Other symptoms and signs involving the nervous system (R29.898) Hemiplegia - Right/Left: Left Hemiplegia - dominant/non-dominant: Non-Dominant Hemiplegia - caused by: Unspecified   Activity Tolerance Patient tolerated treatment well   Patient Left in bed;with call bell/phone within  reach;with bed alarm set   Nurse Communication  (splint wear schedule)        Time: 6659-9357 OT Time Calculation (min): 15 min  Charges: OT General Charges $OT Visit: 1 Visit OT Treatments $Orthotics/Prosthetics Check: 8-22 mins  Derl Barrow, OTR/L [place]  Office [phone] Pager: [place] 04/05/2021, 8:57 AM

## 2021-04-07 ENCOUNTER — Encounter (HOSPITAL_COMMUNITY): Payer: Self-pay

## 2021-04-07 ENCOUNTER — Inpatient Hospital Stay (HOSPITAL_COMMUNITY)
Admission: EM | Admit: 2021-04-07 | Discharge: 2021-05-10 | DRG: 070 | Disposition: A | Discharge status: Skilled Nursing Facility | Payer: Medicaid Other | Attending: Internal Medicine | Admitting: Internal Medicine

## 2021-04-07 ENCOUNTER — Other Ambulatory Visit: Payer: Self-pay

## 2021-04-07 DIAGNOSIS — R4689 Other symptoms and signs involving appearance and behavior: Secondary | ICD-10-CM

## 2021-04-07 DIAGNOSIS — C3491 Malignant neoplasm of unspecified part of right bronchus or lung: Secondary | ICD-10-CM | POA: Diagnosis present

## 2021-04-07 DIAGNOSIS — Z886 Allergy status to analgesic agent status: Secondary | ICD-10-CM

## 2021-04-07 DIAGNOSIS — G9341 Metabolic encephalopathy: Principal | ICD-10-CM | POA: Diagnosis present

## 2021-04-07 DIAGNOSIS — Z515 Encounter for palliative care: Secondary | ICD-10-CM

## 2021-04-07 DIAGNOSIS — T380X5A Adverse effect of glucocorticoids and synthetic analogues, initial encounter: Secondary | ICD-10-CM | POA: Diagnosis present

## 2021-04-07 DIAGNOSIS — C7931 Secondary malignant neoplasm of brain: Secondary | ICD-10-CM | POA: Diagnosis present

## 2021-04-07 DIAGNOSIS — R296 Repeated falls: Secondary | ICD-10-CM | POA: Diagnosis present

## 2021-04-07 DIAGNOSIS — D630 Anemia in neoplastic disease: Secondary | ICD-10-CM | POA: Diagnosis present

## 2021-04-07 DIAGNOSIS — F32A Depression, unspecified: Secondary | ICD-10-CM | POA: Diagnosis present

## 2021-04-07 DIAGNOSIS — Z87891 Personal history of nicotine dependence: Secondary | ICD-10-CM

## 2021-04-07 DIAGNOSIS — F1010 Alcohol abuse, uncomplicated: Secondary | ICD-10-CM | POA: Diagnosis present

## 2021-04-07 DIAGNOSIS — C3490 Malignant neoplasm of unspecified part of unspecified bronchus or lung: Secondary | ICD-10-CM

## 2021-04-07 DIAGNOSIS — I5042 Chronic combined systolic (congestive) and diastolic (congestive) heart failure: Secondary | ICD-10-CM | POA: Diagnosis present

## 2021-04-07 DIAGNOSIS — R4182 Altered mental status, unspecified: Secondary | ICD-10-CM | POA: Diagnosis present

## 2021-04-07 DIAGNOSIS — R410 Disorientation, unspecified: Secondary | ICD-10-CM | POA: Diagnosis not present

## 2021-04-07 DIAGNOSIS — Z681 Body mass index (BMI) 19 or less, adult: Secondary | ICD-10-CM

## 2021-04-07 DIAGNOSIS — G9340 Encephalopathy, unspecified: Secondary | ICD-10-CM | POA: Diagnosis present

## 2021-04-07 DIAGNOSIS — F05 Delirium due to known physiological condition: Secondary | ICD-10-CM | POA: Diagnosis present

## 2021-04-07 DIAGNOSIS — Z79899 Other long term (current) drug therapy: Secondary | ICD-10-CM

## 2021-04-07 DIAGNOSIS — N1832 Chronic kidney disease, stage 3b: Secondary | ICD-10-CM | POA: Diagnosis present

## 2021-04-07 DIAGNOSIS — F19951 Other psychoactive substance use, unspecified with psychoactive substance-induced psychotic disorder with hallucinations: Secondary | ICD-10-CM | POA: Diagnosis present

## 2021-04-07 DIAGNOSIS — M79651 Pain in right thigh: Secondary | ICD-10-CM | POA: Diagnosis present

## 2021-04-07 DIAGNOSIS — Z20822 Contact with and (suspected) exposure to covid-19: Secondary | ICD-10-CM | POA: Diagnosis present

## 2021-04-07 DIAGNOSIS — F29 Unspecified psychosis not due to a substance or known physiological condition: Principal | ICD-10-CM | POA: Diagnosis present

## 2021-04-07 DIAGNOSIS — R441 Visual hallucinations: Secondary | ICD-10-CM

## 2021-04-07 DIAGNOSIS — D509 Iron deficiency anemia, unspecified: Secondary | ICD-10-CM | POA: Diagnosis present

## 2021-04-07 DIAGNOSIS — Z9851 Tubal ligation status: Secondary | ICD-10-CM

## 2021-04-07 DIAGNOSIS — R627 Adult failure to thrive: Secondary | ICD-10-CM | POA: Diagnosis not present

## 2021-04-07 DIAGNOSIS — D61818 Other pancytopenia: Secondary | ICD-10-CM | POA: Diagnosis not present

## 2021-04-07 DIAGNOSIS — R52 Pain, unspecified: Secondary | ICD-10-CM

## 2021-04-07 DIAGNOSIS — D638 Anemia in other chronic diseases classified elsewhere: Secondary | ICD-10-CM

## 2021-04-07 DIAGNOSIS — G9389 Other specified disorders of brain: Secondary | ICD-10-CM

## 2021-04-07 DIAGNOSIS — E43 Unspecified severe protein-calorie malnutrition: Secondary | ICD-10-CM | POA: Diagnosis present

## 2021-04-07 DIAGNOSIS — Z853 Personal history of malignant neoplasm of breast: Secondary | ICD-10-CM

## 2021-04-07 LAB — CBC WITH DIFFERENTIAL/PLATELET
Abs Immature Granulocytes: 0.11 10*3/uL — ABNORMAL HIGH (ref 0.00–0.07)
Basophils Absolute: 0 10*3/uL (ref 0.0–0.1)
Basophils Relative: 0 %
Eosinophils Absolute: 0 10*3/uL (ref 0.0–0.5)
Eosinophils Relative: 0 %
HCT: 39.1 % (ref 36.0–46.0)
Hemoglobin: 12.6 g/dL (ref 12.0–15.0)
Immature Granulocytes: 2 %
Lymphocytes Relative: 13 %
Lymphs Abs: 0.6 10*3/uL — ABNORMAL LOW (ref 0.7–4.0)
MCH: 33.7 pg (ref 26.0–34.0)
MCHC: 32.2 g/dL (ref 30.0–36.0)
MCV: 104.5 fL — ABNORMAL HIGH (ref 80.0–100.0)
Monocytes Absolute: 0.2 10*3/uL (ref 0.1–1.0)
Monocytes Relative: 5 %
Neutro Abs: 3.7 10*3/uL (ref 1.7–7.7)
Neutrophils Relative %: 80 %
Platelets: 182 10*3/uL (ref 150–400)
RBC: 3.74 MIL/uL — ABNORMAL LOW (ref 3.87–5.11)
RDW: 14.8 % (ref 11.5–15.5)
WBC: 4.6 10*3/uL (ref 4.0–10.5)
nRBC: 0 % (ref 0.0–0.2)

## 2021-04-07 LAB — BASIC METABOLIC PANEL
Anion gap: 10 (ref 5–15)
BUN: 66 mg/dL — ABNORMAL HIGH (ref 6–20)
CO2: 23 mmol/L (ref 22–32)
Calcium: 9.6 mg/dL (ref 8.9–10.3)
Chloride: 107 mmol/L (ref 98–111)
Creatinine, Ser: 2.43 mg/dL — ABNORMAL HIGH (ref 0.44–1.00)
GFR, Estimated: 23 mL/min — ABNORMAL LOW (ref >60)
Glucose, Bld: 115 mg/dL — ABNORMAL HIGH (ref 70–99)
Potassium: 4.6 mmol/L (ref 3.5–5.1)
Sodium: 140 mmol/L (ref 135–145)

## 2021-04-07 LAB — URINALYSIS, ROUTINE W REFLEX MICROSCOPIC
Bilirubin Urine: NEGATIVE
Glucose, UA: NEGATIVE mg/dL
Hgb urine dipstick: NEGATIVE
Ketones, ur: NEGATIVE mg/dL
Nitrite: NEGATIVE
Protein, ur: NEGATIVE mg/dL
Specific Gravity, Urine: 1.012 (ref 1.005–1.030)
pH: 5 (ref 5.0–8.0)

## 2021-04-07 LAB — BLOOD GAS, VENOUS
Acid-base deficit: 2 mmol/L (ref 0.0–2.0)
Bicarbonate: 24.1 mmol/L (ref 20.0–28.0)
O2 Saturation: 21 %
Patient temperature: 98.6
pCO2, Ven: 48.1 mmHg (ref 44.0–60.0)
pH, Ven: 7.32 (ref 7.250–7.430)
pO2, Ven: <31 mmHg — CL (ref 32.0–45.0)

## 2021-04-07 LAB — RAPID URINE DRUG SCREEN, HOSP PERFORMED
Amphetamines: NOT DETECTED
Barbiturates: NOT DETECTED
Benzodiazepines: NOT DETECTED
Cocaine: NOT DETECTED
Opiates: NOT DETECTED
Tetrahydrocannabinol: NOT DETECTED

## 2021-04-07 LAB — TSH: TSH: 1.519 u[IU]/mL (ref 0.350–4.500)

## 2021-04-07 MED ORDER — ALBUTEROL SULFATE (2.5 MG/3ML) 0.083% IN NEBU: 2.5000 mg | INHALATION_SOLUTION | Freq: Four times a day (QID) | RESPIRATORY_TRACT | Status: DC

## 2021-04-07 MED ORDER — THIAMINE HCL 100 MG PO TABS
100.0000 mg | ORAL_TABLET | Freq: Every day | ORAL | Status: DC
  Administered 2021-04-08 – 2021-05-10 (×33): 100 mg via ORAL
  Filled 2021-04-07 (×33): qty 1

## 2021-04-07 MED ORDER — ACETAMINOPHEN 325 MG PO TABS
650.0000 mg | ORAL_TABLET | Freq: Four times a day (QID) | ORAL | Status: DC | PRN
  Administered 2021-04-13 – 2021-05-03 (×8): 650 mg via ORAL
  Filled 2021-04-07 (×8): qty 2

## 2021-04-07 MED ORDER — POLYETHYLENE GLYCOL 3350 17 G PO PACK
17.0000 g | PACK | Freq: Two times a day (BID) | ORAL | Status: DC
  Administered 2021-04-08 – 2021-05-07 (×39): 17 g via ORAL
  Filled 2021-04-07 (×49): qty 1

## 2021-04-07 MED ORDER — ALBUTEROL SULFATE (2.5 MG/3ML) 0.083% IN NEBU: 2.5000 mg | INHALATION_SOLUTION | RESPIRATORY_TRACT | Status: DC | PRN

## 2021-04-07 MED ORDER — FOLIC ACID 1 MG PO TABS
1.0000 mg | ORAL_TABLET | Freq: Every day | ORAL | Status: DC
  Administered 2021-04-08 – 2021-05-10 (×33): 1 mg via ORAL
  Filled 2021-04-07 (×34): qty 1

## 2021-04-07 MED ORDER — PANTOPRAZOLE SODIUM 40 MG PO TBEC
40.0000 mg | DELAYED_RELEASE_TABLET | Freq: Two times a day (BID) | ORAL | Status: DC
  Administered 2021-04-07 – 2021-04-13 (×12): 40 mg via ORAL
  Filled 2021-04-07 (×12): qty 1

## 2021-04-07 MED ORDER — HYDRALAZINE HCL 20 MG/ML IJ SOLN: 10.0000 mg | Freq: Four times a day (QID) | INTRAMUSCULAR | Status: DC | PRN

## 2021-04-07 MED ORDER — HEPARIN SODIUM (PORCINE) 5000 UNIT/ML IJ SOLN
5000.0000 [IU] | Freq: Three times a day (TID) | INTRAMUSCULAR | Status: DC
  Administered 2021-04-07 – 2021-04-17 (×28): 5000 [IU] via SUBCUTANEOUS
  Filled 2021-04-07 (×29): qty 1

## 2021-04-07 MED ORDER — SODIUM CHLORIDE 0.9 % IV SOLN: 250.0000 mL | INTRAVENOUS | Status: DC | PRN

## 2021-04-07 MED ORDER — DEXTROSE-NACL 5-0.9 % IV SOLN: INTRAVENOUS | Status: DC

## 2021-04-07 MED ORDER — LEVETIRACETAM 500 MG PO TABS
500.0000 mg | ORAL_TABLET | Freq: Two times a day (BID) | ORAL | Status: DC
  Administered 2021-04-07 – 2021-05-07 (×61): 500 mg via ORAL
  Filled 2021-04-07 (×61): qty 1

## 2021-04-07 MED ORDER — SODIUM CHLORIDE 0.9% FLUSH
3.0000 mL | Freq: Two times a day (BID) | INTRAVENOUS | Status: DC
  Administered 2021-04-07 – 2021-05-10 (×47): 3 mL via INTRAVENOUS

## 2021-04-07 MED ORDER — CARVEDILOL 6.25 MG PO TABS
6.2500 mg | ORAL_TABLET | Freq: Two times a day (BID) | ORAL | Status: DC
  Administered 2021-04-07 – 2021-04-22 (×31): 6.25 mg via ORAL
  Filled 2021-04-07 (×13): qty 1
  Filled 2021-04-07: qty 2
  Filled 2021-04-07 (×6): qty 1
  Filled 2021-04-07: qty 2
  Filled 2021-04-07 (×10): qty 1

## 2021-04-07 MED ORDER — LORAZEPAM 2 MG/ML IJ SOLN
0.5000 mg | Freq: Once | INTRAMUSCULAR | Status: AC
Start: 2021-04-07 — End: 2021-04-07
  Administered 2021-04-07: 0.5 mg via INTRAMUSCULAR
  Filled 2021-04-07: qty 1

## 2021-04-07 MED ORDER — DEXTROSE-NACL 5-0.9 % IV SOLN: INTRAVENOUS | Status: AC

## 2021-04-07 MED ORDER — BISACODYL 10 MG RE SUPP: 10.0000 mg | Freq: Every day | RECTAL | Status: DC | PRN

## 2021-04-07 MED ORDER — IPRATROPIUM-ALBUTEROL 0.5-2.5 (3) MG/3ML IN SOLN
3.0000 mL | Freq: Four times a day (QID) | RESPIRATORY_TRACT | Status: DC
  Administered 2021-04-07 – 2021-04-08 (×2): 3 mL via RESPIRATORY_TRACT
  Filled 2021-04-07 (×2): qty 3

## 2021-04-07 MED ORDER — LORAZEPAM 2 MG/ML IJ SOLN: 0.5000 mg | INTRAMUSCULAR | Status: DC | PRN

## 2021-04-07 MED ORDER — SODIUM CHLORIDE 0.9% FLUSH
3.0000 mL | INTRAVENOUS | Status: DC | PRN
  Administered 2021-04-17 – 2021-04-28 (×3): 3 mL via INTRAVENOUS

## 2021-04-07 MED ORDER — GABAPENTIN 300 MG PO CAPS
300.0000 mg | ORAL_CAPSULE | Freq: Every day | ORAL | Status: DC
  Administered 2021-04-07 – 2021-04-15 (×9): 300 mg via ORAL
  Filled 2021-04-07 (×9): qty 1

## 2021-04-07 MED ORDER — IPRATROPIUM BROMIDE 0.02 % IN SOLN: 0.5000 mg | Freq: Four times a day (QID) | RESPIRATORY_TRACT | Status: DC

## 2021-04-07 NOTE — ED Notes (Signed)
Patient resting comfortably at this time, still refusing all care, will continue to monitor.

## 2021-04-07 NOTE — ED Provider Notes (Signed)
Brenda [name]   CSN: [id number] Arrival date & time: 04/07/21  1354     History  Chief Complaint  Patient presents with   Failure To Thrive    Brenda [name] is a 61 y.o. 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 Patient's son who lives with patient expresses concern for increased aggression and confusion since DC from hospital on 1/14. Of [name], patient was discharged home on steroids. States patient was seeing things in the room/on the walls.     Home Medications Prior to Admission medications   Medication Sig Start Date End Date Taking? Authorizing Provider  acetaminophen (TYLENOL) 325 MG tablet Take 2 tablets (650 mg total) by mouth every 6 (six) hours as needed for mild pain, moderate pain or headache. Patient not taking: Reported on 03/12/2021 04/11/20   Domenic Polite, MD  carvedilol (COREG) 6.25 MG tablet Take 1 tablet (6.25 mg total) by mouth 2 (two) times daily. 04/05/21 05/05/21  Donne Hazel, MD  dexamethasone (DECADRON) 4 MG tablet Take 1 tablet (4 mg total) by mouth every 8 (eight) hours. 04/05/21 05/05/21  Donne Hazel, MD  ferrous sulfate 325 (65 FE) MG tablet TAKE 1 TABLET (325 MG TOTAL) BY MOUTH 2 (TWO) TIMES DAILY WITH A MEAL. Patient not taking: Reported on 03/12/2021 04/11/20 04/11/21  Domenic Polite, MD  folic acid (FOLVITE) 1 MG tablet TAKE 1 TABLET (1 MG TOTAL) BY MOUTH DAILY. Patient not taking: Reported on 03/12/2021 04/11/20 04/11/21  Domenic Polite, MD  gabapentin (NEURONTIN) 300 MG capsule Take 1 capsule (300 mg total) by mouth at bedtime. Patient not taking: Reported on 03/12/2021 06/11/20   Harle Stanford., PA-C  levETIRAcetam (KEPPRA) 500 MG tablet Take 1 tablet (500 mg total) by mouth 2 (two) times daily. 04/05/21 05/05/21  Donne Hazel, MD  pantoprazole (PROTONIX) 40 MG tablet Take 1 tablet (40 mg total) by mouth 2 (two) times daily. Patient not taking:  Reported on 03/12/2021 08/16/20 08/16/21  Eugenie Filler, MD  polyethylene glycol (MIRALAX / GLYCOLAX) 17 g packet Dissolve 1 packet (17 g) in liquid and drink 2 (two) times daily. Patient not taking: Reported on 03/12/2021 07/26/20   Arrien, Jimmy Picket, MD  thiamine 100 MG tablet Take 1 tablet (100 mg total) by mouth daily. Patient not taking: Reported on 03/12/2021 08/16/20   Eugenie Filler, MD  prochlorperazine (COMPAZINE) 10 MG tablet Take 1 tablet (10 mg total) by mouth every 6 (six) hours as needed for nausea or vomiting. Patient not taking: Reported on 04/08/2020 01/17/20 04/11/20  Heilingoetter, Cassandra L, PA-C      Allergies    Aspirin adult low [aspirin]    Review of Systems   Review of Systems  Constitutional:  Negative for chills and fever.  HENT:  Negative for ear pain and sore throat.   Eyes:  Negative for pain and visual disturbance.  Respiratory:  Negative for cough and shortness of breath.   Cardiovascular:  Negative for chest pain and palpitations.  Gastrointestinal:  Negative for abdominal pain and vomiting.  Genitourinary:  Negative for dysuria and hematuria.  Musculoskeletal:  Negative for arthralgias and back pain.  Skin:  Negative for color change and rash.  Neurological:  Negative for seizures and syncope.  Psychiatric/Behavioral:  Positive for confusion.   All other systems reviewed and are negative.  Physical Exam Updated Vital Signs BP (!) 166/95    Pulse 85  Temp 98 F (36.7 C)    Resp 18    LMP 01/19/2011    SpO2 99%  Physical Exam Vitals and nursing [name] reviewed.  Constitutional:      General: She is not in acute distress.    Appearance: She is well-developed.  HENT:     Head: Normocephalic and atraumatic.  Eyes:     Conjunctiva/sclera: Conjunctivae normal.  Cardiovascular:     Rate and Rhythm: Normal rate and regular rhythm.     Heart sounds: No murmur heard. Pulmonary:     Effort: Pulmonary effort is normal. No respiratory  distress.     Breath sounds: Normal breath sounds.  Abdominal:     Palpations: Abdomen is soft.     Tenderness: There is no abdominal tenderness.  Musculoskeletal:        General: No swelling.     Cervical back: Neck supple.  Skin:    General: Skin is warm and dry.     Capillary Refill: Capillary refill takes less than 2 seconds.  Neurological:     Mental Status: She is alert. She is confused.     GCS: GCS eye subscore is 4. GCS verbal subscore is 5. GCS motor subscore is 6.     Comments: Decreased motor function in left hand-known and not new  Psychiatric:        Mood and Affect: Mood normal. Affect is labile and angry.        Behavior: Behavior is agitated and aggressive.        Thought Content: Thought content does not include homicidal or suicidal ideation. Thought content does not include homicidal or suicidal plan.    ED Results / Procedures / Treatments   Labs (all labs ordered are listed, but only abnormal results are displayed) Labs Reviewed - No data to display  EKG None  Radiology No results found.  Procedures Procedures    Medications Ordered in ED Medications - No data to display  ED Course/ Medical Decision Making/ A&P                           Medical Decision Making  61 y.o. 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 Pt is alert but confused on exam. Aggressive and verbally abusive to ED staff. 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 At this time patient is a safety risk for our staff. IM Ativan 0.5 mg given once. Labs ordered to r/o other causes of AMS/confusion.   I spoke with patient's oncologist who states he has not seen patient in over a year due to missing appointments. Pt recommended for hospice but currently on palliative care. He states he has no further recommended interventions at this point.   I spoke with patient's son who is requesting nursing  care placement. Patient was evaluated for nursing home placement upon most recent discharge and declined from multiple facilities.   4:20 PM Pt signed out to oncoming physician while awaiting labs.         Final Clinical Impression(s) / ED Diagnoses Final diagnoses:  Confusion  Visual hallucinations  Aggression    Rx / DC Orders ED Discharge Orders     None         Lianne Cure, DO 44/31/54 1620

## 2021-04-07 NOTE — ED Notes (Signed)
Patient refusing all care at this time. Patient has removed Purewick and is refusing warm blankets. RN and MD aware, will continue to monitor.

## 2021-04-07 NOTE — ED Triage Notes (Addendum)
EMS reports from home, c/o unable to function at home, failure to thrive. Staying with son, but unable to take care of her. Pt states she has no complaints. And came to ED because family went out and no one was home.  BP 140/82 RR 20 HR 84 Sp02 98 RA CBG 108

## 2021-04-07 NOTE — ED Notes (Signed)
This Probation officer was assisting patient to change out of soiled clothes into hospital gown. Patient asked "What do you think you're doing to me?" I explained that I was helping her change out of her dirty clothing. Patient put a finger in this writer's face stating "I don't need to get treated or see the doctor, you're trying to keep me from getting food to eat." Patient then raised her hand like she was going to hit me but was able to be verbally redirected and this writer explained that we cannot give her anything to eat at the moment, but will as soon as we can.

## 2021-04-07 NOTE — H&P (Signed)
[facility] History and Physical  Brenda [name] IWL:[id number] DOB: 1960/08/27 DOA: 04/07/2021  Referring physician: ED  PCP: Pcp, No   Patient is coming from: Home  Chief Complaint: Altered mental status  HPI: Brenda [name] is a 61 y.o. female with past medical history of stage IV lung cancer with recurrent falls who was recently discharged from the hospital after being treated for metastatic lung cancer with brain mets.  She was discharged home but then at home she was aggressive, agitated, restless and acting evil and out of it at times as per the son.  She was also talking irrelevant things and looking at picture at the wall. Son states that she got mean after taking dexamethasone around 30 mins after it. No fever reported. No nausea, vomiting, fever or chills.  Patient is being cared by her son at home who could not handle her situation and the patient was brought into the hospital.  In the previous admission patient had declined brain surgery and was treated with radiation and Decadron during the hospital stay.   ED Course: In the ED, patient patient was very agitated and was difficult to obtain  blood sample.  Received Ativan x1 for blood sample.  Oncology was consulted from the ED who recommended palliative care.  Review of Systems:  Negative due to patient's condition.  Spoke with the patient's son on the phone   Past Medical History:  Diagnosis Date   Breast cancer [facility])    Depression    Mental disorder    Past Surgical History:  Procedure Laterality Date   ESOPHAGOGASTRODUODENOSCOPY (EGD) WITH PROPOFOL N/A 04/09/2020   Procedure: ESOPHAGOGASTRODUODENOSCOPY (EGD) WITH PROPOFOL;  Surgeon: Doran Stabler, MD;  Location: [facility];  Service: Gastroenterology;  Laterality: N/A;   IR IMAGING GUIDED PORT INSERTION  11/03/2019   TUBAL LIGATION     VIDEO BRONCHOSCOPY WITH ENDOBRONCHIAL NAVIGATION N/A 04/04/2019   Procedure: VIDEO BRONCHOSCOPY WITH ENDOBRONCHIAL  NAVIGATION;  Surgeon: Garner Nash, DO;  Location: [facility];  Service: Thoracic;  Laterality: N/A;   VIDEO BRONCHOSCOPY WITH ENDOBRONCHIAL ULTRASOUND N/A 04/04/2019   Procedure: VIDEO BRONCHOSCOPY WITH ENDOBRONCHIAL ULTRASOUND;  Surgeon: Garner Nash, DO;  Location: [facility];  Service: Thoracic;  Laterality: N/A;    Social History:  reports that she quit smoking about 22 months ago. Her smoking use included cigarettes. She has a 15.00 pack-year smoking history. She has never used smokeless tobacco. She reports current alcohol use of about 84.0 standard drinks per week. She reports current drug use. Drug: Cocaine.  Allergies  Allergen Reactions   Aspirin Adult Low [Aspirin] Other (See Comments)    Stomach upset    Family History  Problem Relation Age of Onset   Cancer Mother    Heart attack Father        thinks he had MI in his 69s; died from saw accident      Prior to Admission medications   Medication Sig Start Date End Date Taking? Authorizing Provider  acetaminophen (TYLENOL) 325 MG tablet Take 2 tablets (650 mg total) by mouth every 6 (six) hours as needed for mild pain, moderate pain or headache. Patient not taking: Reported on 03/12/2021 04/11/20   Domenic Polite, MD  carvedilol (COREG) 6.25 MG tablet Take 1 tablet (6.25 mg total) by mouth 2 (two) times daily. 04/05/21 05/05/21  Donne Hazel, MD  dexamethasone (DECADRON) 4 MG tablet Take 1 tablet (4 mg total) by mouth every 8 (eight) hours. 04/05/21 05/05/21  Wyline Copas,  Orpah Melter, MD  ferrous sulfate 325 (65 FE) MG tablet TAKE 1 TABLET (325 MG TOTAL) BY MOUTH 2 (TWO) TIMES DAILY WITH A MEAL. Patient not taking: Reported on 03/12/2021 04/11/20 04/11/21  Domenic Polite, MD  folic acid (FOLVITE) 1 MG tablet TAKE 1 TABLET (1 MG TOTAL) BY MOUTH DAILY. Patient not taking: Reported on 03/12/2021 04/11/20 04/11/21  Domenic Polite, MD  gabapentin (NEURONTIN) 300 MG capsule Take 1 capsule (300 mg total) by mouth at bedtime. Patient not taking:  Reported on 03/12/2021 06/11/20   Harle Stanford., PA-C  levETIRAcetam (KEPPRA) 500 MG tablet Take 1 tablet (500 mg total) by mouth 2 (two) times daily. 04/05/21 05/05/21  Donne Hazel, MD  pantoprazole (PROTONIX) 40 MG tablet Take 1 tablet (40 mg total) by mouth 2 (two) times daily. Patient not taking: Reported on 03/12/2021 08/16/20 08/16/21  Eugenie Filler, MD  polyethylene glycol (MIRALAX / GLYCOLAX) 17 g packet Dissolve 1 packet (17 g) in liquid and drink 2 (two) times daily. Patient not taking: Reported on 03/12/2021 07/26/20   Arrien, Jimmy Picket, MD  thiamine 100 MG tablet Take 1 tablet (100 mg total) by mouth daily. Patient not taking: Reported on 03/12/2021 08/16/20   Eugenie Filler, MD  prochlorperazine (COMPAZINE) 10 MG tablet Take 1 tablet (10 mg total) by mouth every 6 (six) hours as needed for nausea or vomiting. Patient not taking: Reported on 04/08/2020 01/17/20 04/11/20  Heilingoetter, Cassandra L, PA-C    Physical Exam:  Vitals:   04/07/21 1406 04/07/21 1600  BP: (!) 166/95 (!) 163/99  Pulse: 85 90  Resp: 18 18  Temp: 98 F (36.7 C)   SpO2: 99% 98%   Wt Readings from Last 3 Encounters:  03/12/21 43.8 kg  08/16/20 56.5 kg  07/31/20 54 kg   There is no height or weight on file to calculate BMI.  General:  Thinly built, not in obvious distress HENT: Normocephalic, pupils equally reacting to light and accommodation.  No scleral pallor or icterus noted. Oral mucosa is moist.  Chest:   Diminished breath sounds bilaterally.  CVS: S1 &S2 heard. No murmur.  Regular rate and rhythm. Abdomen: Soft, nontender, nondistended.  Bowel sounds are heard.  Liver is not palpable, no abdominal mass palpated Extremities: No cyanosis, clubbing or edema.  Peripheral pulses are palpable. Psych: Alert, awake,  CNS:  No cranial nerve deficits.  Confused and only oriented to place.  Left-sided weakness. Skin: Warm and dry.  No rashes noted.  Labs on Admission:   CBC: No results  for input(s): WBC, NEUTROABS, HGB, HCT, MCV, PLT in the last 168 hours.  Basic Metabolic Panel: Recent Labs  Lab 04/01/21 0930 04/03/21 0457 04/04/21 0802  NA 135 136 137  K 4.9 4.6 3.9  CL 106 106 107  CO2 22 23 22   GLUCOSE 137* 130* 117*  BUN 84* 87* 83*  CREATININE 2.58* 2.70* 2.50*  CALCIUM 8.4* 8.6* 8.8*    Liver Function Tests: Recent Labs  Lab 04/03/21 0457 04/04/21 0802  AST 17 15  ALT 44 36  ALKPHOS 57 62  BILITOT 0.5 0.4  PROT 6.1* 6.3*  ALBUMIN 2.7* 2.8*   No results for input(s): LIPASE, AMYLASE in the last 168 hours. No results for input(s): AMMONIA in the last 168 hours.  Cardiac Enzymes: No results for input(s): CKTOTAL, CKMB, CKMBINDEX, TROPONINI in the last 168 hours.  BNP (last 3 results) No results for input(s): BNP in the last 8760 hours.  ProBNP (last  3 results) No results for input(s): PROBNP in the last 8760 hours.  CBG: No results for input(s): GLUCAP in the last 168 hours.  Lipase     Component Value Date/Time   LIPASE 55 (H) 07/01/2020 1320     Urinalysis    Component Value Date/Time   COLORURINE STRAW (A) 08/09/2020 1628   APPEARANCEUR CLEAR 08/09/2020 1628   LABSPEC 1.002 (L) 08/09/2020 1628   PHURINE 7.0 08/09/2020 1628   GLUCOSEU NEGATIVE 08/09/2020 1628   HGBUR SMALL (A) 08/09/2020 1628   BILIRUBINUR NEGATIVE 08/09/2020 1628   KETONESUR NEGATIVE 08/09/2020 1628   PROTEINUR NEGATIVE 08/09/2020 1628   UROBILINOGEN 0.2 05/25/2012 1958   NITRITE NEGATIVE 08/09/2020 1628   LEUKOCYTESUR NEGATIVE 08/09/2020 1628     Drugs of Abuse     Component Value Date/Time   LABOPIA NONE DETECTED 08/09/2020 1628   COCAINSCRNUR POSITIVE (A) 08/09/2020 1628   LABBENZ NONE DETECTED 08/09/2020 1628   AMPHETMU NONE DETECTED 08/09/2020 1628   THCU NONE DETECTED 08/09/2020 1628   LABBARB NONE DETECTED 08/09/2020 1628      Radiological Exams on Admission: No results found.  EKG: Personally reviewed by me which shows sinus  rhythm.  Assessment/Plan Principal Problem:   Altered mental status  Altered mental status, agitation, confusion.  Patient was on Decadron as outpatient.  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  Patient has metastatic cancer with previous refusal to surgical intervention/had radiation treatment while in the hospital on last visit.  ED provider has reached out to Dr. Julien Nordmann oncology who recommends palliative care/ hospice level of care and possible holding off with steroids at this time.  Patient is currently being followed by palliative care as outpatient.  Patient would highly benefit from hospice level of care.  Check urine drug screen and urinalysis to rule out infection.  TSH at 4.4.  Blood gas shows a low oxygen.  History of recurrent falls deconditioning and debility.  Was assessed for skilled nursing facility in the previous admission but patient had gone home at that time.  We will get PT evaluation, TOC consultation for disposition.  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  Patient was seen by neurosurgery in the previous visit and had declined surgical intervention.  Patient underwent radiation treatment between 03/18/2021 to 04/01/2021 and was subsequently put on Decadron.  Patient was on Keppra in the last admission.  We will continue while in the hospital.  Stage IV lung cancer with pleural effusion.  Patient is a palliative care candidate will  benefit from hospice level of care.  We will consult palliative care as well.  History of chronic combined systolic and diastolic congestive heart failure.  Last known ejection fraction of 40 to 45%.  On Coreg 6.2 mg twice daily.  CKD stage IIIb.  Creatinine today at 2.4.  We will continue to monitor closely. Anemia of chronic disease/malignancy.  We will follow CBC.  Likely at baseline.  History of polysubstance abuse including cocaine and alcohol.   Continue thiamine.  Severe protein calorie malnutrition.  Encourage oral nutrition.  We will consult dietary  DVT Prophylaxis: Lovenox subcu  Consultant: ED spoke with Dr. Julien Nordmann oncology  Code Status: Full code  Microbiology none  Antibiotics: None  Family Communication:  Patients' condition and plan of care including tests being ordered have been discussed with the patient and the patient's son who indicate understanding and agree with the plan.   Status is: Observation  The patient remains OBS appropriate and will d/c before 2 midnights.   Severity of Illness: The appropriate patient status for this patient is OBSERVATION. Observation status is judged to be reasonable and necessary in order to provide the required intensity of service to ensure the patient's safety. The patient's presenting symptoms, physical exam findings, and initial radiographic and laboratory data in the context of their medical condition is felt to place them at decreased risk for further clinical deterioration. Furthermore, it is anticipated that the patient will be medically stable for discharge from the hospital within 2 midnights of admission.   Signed, Flora Lipps, MD [facility] 04/07/2021

## 2021-04-08 DIAGNOSIS — R5381 Other malaise: Secondary | ICD-10-CM | POA: Diagnosis not present

## 2021-04-08 DIAGNOSIS — G9341 Metabolic encephalopathy: Secondary | ICD-10-CM | POA: Diagnosis present

## 2021-04-08 DIAGNOSIS — F19951 Other psychoactive substance use, unspecified with psychoactive substance-induced psychotic disorder with hallucinations: Secondary | ICD-10-CM | POA: Diagnosis present

## 2021-04-08 DIAGNOSIS — D509 Iron deficiency anemia, unspecified: Secondary | ICD-10-CM | POA: Diagnosis present

## 2021-04-08 DIAGNOSIS — I5042 Chronic combined systolic (congestive) and diastolic (congestive) heart failure: Secondary | ICD-10-CM | POA: Diagnosis present

## 2021-04-08 DIAGNOSIS — D61818 Other pancytopenia: Secondary | ICD-10-CM | POA: Diagnosis not present

## 2021-04-08 DIAGNOSIS — Z681 Body mass index (BMI) 19 or less, adult: Secondary | ICD-10-CM | POA: Diagnosis not present

## 2021-04-08 DIAGNOSIS — G9340 Encephalopathy, unspecified: Secondary | ICD-10-CM | POA: Diagnosis not present

## 2021-04-08 DIAGNOSIS — R296 Repeated falls: Secondary | ICD-10-CM | POA: Diagnosis present

## 2021-04-08 DIAGNOSIS — T380X5A Adverse effect of glucocorticoids and synthetic analogues, initial encounter: Secondary | ICD-10-CM | POA: Diagnosis present

## 2021-04-08 DIAGNOSIS — R4182 Altered mental status, unspecified: Secondary | ICD-10-CM | POA: Diagnosis not present

## 2021-04-08 DIAGNOSIS — R410 Disorientation, unspecified: Secondary | ICD-10-CM | POA: Diagnosis present

## 2021-04-08 DIAGNOSIS — C3491 Malignant neoplasm of unspecified part of right bronchus or lung: Secondary | ICD-10-CM | POA: Diagnosis present

## 2021-04-08 DIAGNOSIS — R627 Adult failure to thrive: Secondary | ICD-10-CM | POA: Diagnosis present

## 2021-04-08 DIAGNOSIS — Z9851 Tubal ligation status: Secondary | ICD-10-CM | POA: Diagnosis not present

## 2021-04-08 DIAGNOSIS — F29 Unspecified psychosis not due to a substance or known physiological condition: Secondary | ICD-10-CM | POA: Diagnosis present

## 2021-04-08 DIAGNOSIS — Z79899 Other long term (current) drug therapy: Secondary | ICD-10-CM | POA: Diagnosis not present

## 2021-04-08 DIAGNOSIS — Z886 Allergy status to analgesic agent status: Secondary | ICD-10-CM | POA: Diagnosis not present

## 2021-04-08 DIAGNOSIS — M79604 Pain in right leg: Secondary | ICD-10-CM | POA: Diagnosis not present

## 2021-04-08 DIAGNOSIS — G9389 Other specified disorders of brain: Secondary | ICD-10-CM | POA: Diagnosis not present

## 2021-04-08 DIAGNOSIS — Z87891 Personal history of nicotine dependence: Secondary | ICD-10-CM | POA: Diagnosis not present

## 2021-04-08 DIAGNOSIS — C7931 Secondary malignant neoplasm of brain: Secondary | ICD-10-CM | POA: Diagnosis present

## 2021-04-08 DIAGNOSIS — F32A Depression, unspecified: Secondary | ICD-10-CM | POA: Diagnosis present

## 2021-04-08 DIAGNOSIS — Z20822 Contact with and (suspected) exposure to covid-19: Secondary | ICD-10-CM | POA: Diagnosis present

## 2021-04-08 DIAGNOSIS — E43 Unspecified severe protein-calorie malnutrition: Secondary | ICD-10-CM | POA: Diagnosis present

## 2021-04-08 DIAGNOSIS — N1832 Chronic kidney disease, stage 3b: Secondary | ICD-10-CM | POA: Diagnosis present

## 2021-04-08 DIAGNOSIS — M79651 Pain in right thigh: Secondary | ICD-10-CM | POA: Diagnosis present

## 2021-04-08 DIAGNOSIS — Z515 Encounter for palliative care: Secondary | ICD-10-CM | POA: Diagnosis not present

## 2021-04-08 DIAGNOSIS — F1010 Alcohol abuse, uncomplicated: Secondary | ICD-10-CM | POA: Diagnosis not present

## 2021-04-08 DIAGNOSIS — Z853 Personal history of malignant neoplasm of breast: Secondary | ICD-10-CM | POA: Diagnosis not present

## 2021-04-08 DIAGNOSIS — Z7189 Other specified counseling: Secondary | ICD-10-CM | POA: Diagnosis not present

## 2021-04-08 DIAGNOSIS — C3490 Malignant neoplasm of unspecified part of unspecified bronchus or lung: Secondary | ICD-10-CM | POA: Diagnosis not present

## 2021-04-08 DIAGNOSIS — D630 Anemia in neoplastic disease: Secondary | ICD-10-CM | POA: Diagnosis present

## 2021-04-08 DIAGNOSIS — F05 Delirium due to known physiological condition: Secondary | ICD-10-CM | POA: Diagnosis present

## 2021-04-08 DIAGNOSIS — R531 Weakness: Secondary | ICD-10-CM | POA: Diagnosis not present

## 2021-04-08 DIAGNOSIS — D638 Anemia in other chronic diseases classified elsewhere: Secondary | ICD-10-CM | POA: Diagnosis not present

## 2021-04-08 LAB — CBC
HCT: 32.2 % — ABNORMAL LOW (ref 36.0–46.0)
Hemoglobin: 10.5 g/dL — ABNORMAL LOW (ref 12.0–15.0)
MCH: 34.2 pg — ABNORMAL HIGH (ref 26.0–34.0)
MCHC: 32.6 g/dL (ref 30.0–36.0)
MCV: 104.9 fL — ABNORMAL HIGH (ref 80.0–100.0)
Platelets: 154 10*3/uL (ref 150–400)
RBC: 3.07 MIL/uL — ABNORMAL LOW (ref 3.87–5.11)
RDW: 14.7 % (ref 11.5–15.5)
WBC: 3.9 10*3/uL — ABNORMAL LOW (ref 4.0–10.5)
nRBC: 0 % (ref 0.0–0.2)

## 2021-04-08 LAB — RESP PANEL BY RT-PCR (FLU A&B, COVID) ARPGX2
Influenza A by PCR: NEGATIVE
Influenza B by PCR: NEGATIVE
SARS Coronavirus 2 by RT PCR: NEGATIVE

## 2021-04-08 LAB — COMPREHENSIVE METABOLIC PANEL
ALT: 29 U/L (ref 0–44)
AST: 15 U/L (ref 15–41)
Albumin: 2.9 g/dL — ABNORMAL LOW (ref 3.5–5.0)
Alkaline Phosphatase: 66 U/L (ref 38–126)
Anion gap: 9 (ref 5–15)
BUN: 64 mg/dL — ABNORMAL HIGH (ref 6–20)
CO2: 21 mmol/L — ABNORMAL LOW (ref 22–32)
Calcium: 9.2 mg/dL (ref 8.9–10.3)
Chloride: 110 mmol/L (ref 98–111)
Creatinine, Ser: 2.36 mg/dL — ABNORMAL HIGH (ref 0.44–1.00)
GFR, Estimated: 23 mL/min — ABNORMAL LOW (ref >60)
Glucose, Bld: 105 mg/dL — ABNORMAL HIGH (ref 70–99)
Potassium: 3.8 mmol/L (ref 3.5–5.1)
Sodium: 140 mmol/L (ref 135–145)
Total Bilirubin: 0.6 mg/dL (ref 0.3–1.2)
Total Protein: 6.8 g/dL (ref 6.5–8.1)

## 2021-04-08 MED ORDER — IPRATROPIUM-ALBUTEROL 0.5-2.5 (3) MG/3ML IN SOLN
3.0000 mL | Freq: Two times a day (BID) | RESPIRATORY_TRACT | Status: DC
  Administered 2021-04-08 – 2021-04-14 (×12): 3 mL via RESPIRATORY_TRACT
  Filled 2021-04-08 (×12): qty 3

## 2021-04-08 MED ORDER — ALUM & MAG HYDROXIDE-SIMETH 200-200-20 MG/5ML PO SUSP
30.0000 mL | ORAL | Status: DC | PRN
  Administered 2021-04-08: 30 mL via ORAL
  Filled 2021-04-08: qty 30
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
## 2021-04-08 NOTE — Progress Notes (Signed)
PROGRESS NOTE  Brenda [name] HWE:[id number] DOB: [birth year] DOA: 04/07/2021 PCP: Pcp, No   LOS: 0 days   Brief narrative:  Brenda [name] is a 61 y.o. female with past medical history of stage IV lung cancer with recurrent falls who was recently discharged from the hospital after being treated for metastatic lung cancer with brain mets was brought into the hospital by her son who reported that she was aggressive, agitated, restless and acting evil and out of it at times as per the son.  She was also talking irrelevant things and looking at picture at the wall.  Patient was recently admitted hospital for lung cancer with brain metastasis and had undergone radiation treatment and was discharged home on steroids.  At this time patient's son wishes her to be placed as he could not take care of her at home.  Assessment/Plan:  Principal Problem:   Altered mental status  Altered mental status, agitation, confusion.   Patient appears to be slightly improved today.  Off Decadron at this time.  Secondary to steroid-induced psychosis.  Patient had received  radiation in the last admission for brain metastasis.  ED provider had reached out to Dr. Julien Nordmann oncology who recommends palliative care/ hospice level of care and possible holding off with steroids at this time.  Patient is currently being followed by palliative care as outpatient.  Patient would highly benefit from hospice level of care.  Urine drug screen was negative.   TSH at 4.4.  Blood gas showed low oxygen.   History of recurrent falls deconditioning and debility.  Was assessed for skilled nursing facility in the previous admission but patient had gone home at that time.  We will get PT evaluation, TOC consultation for disposition.   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   Patient was seen by neurosurgery in the previous visit and had declined surgical intervention.  Patient underwent radiation  treatment between 03/18/2021 to 04/01/2021 and was subsequently put on Decadron.  Patient was on Keppra in the last admission.  We will continue Keppra while in the hospital.   Stage IV lung cancer with pleural effusion.  Patient is a palliative care candidate will  benefit from hospice level of care.  Palliative care has been consulted.  History of chronic combined systolic and diastolic congestive heart failure.  Last known ejection fraction of 40 to 45%.  On Coreg 6.2 mg twice daily.  Currently compensated   CKD stage IIIb.  Creatinine today at 2.3.  We will continue to monitor closely.  Anemia of chronic disease/malignancy.  We will follow CBC.  Likely at baseline.  History of polysubstance abuse including cocaine and alcohol.  Continue thiamine.   Severe protein calorie malnutrition.  Encourage oral nutrition.   Disposition.  Patient's son at bedside states that he is unable to take care of the patient at home and wishes her to be placed at a skilled nursing facility.  Communicated with the transition of care about this difficult situation   DVT prophylaxis: heparin injection 5,000 Units Start: 04/07/21 2200   Code Status: Full code  Family Communication:  I again spoke with the patient's son at length at bedside.    Status is: Observation  The patient will require care spanning > 2 midnights and should be moved to inpatient because: Unsafe disposition, advanced lung cancer, palliative care/hospice discussion, possible need for placement.  Consultants: Palliative care  Procedures: None  Anti-infectives:  None  Anti-infectives (  From admission, onward)    None       Subjective: Today, patient was seen and examined at bedside.  Patient appears to be little more alert awake today.  Denies any pain, nausea, vomiting.  Patient's son at bedside.  Objective: Vitals:   04/08/21 0700 04/08/21 1100  BP: 127/84 128/87  Pulse: 88 87  Resp: 17 14  Temp:    SpO2: 97% 99%    No intake or output data in the 24 hours ending 04/08/21 1306 There were no vitals filed for this visit. There is no height or weight on file to calculate BMI.   Physical Exam:  GENERAL: Patient is alert awake and communicative not in obvious distress.  Thinly built. HENT: No scleral pallor or icterus. Pupils equally reactive to light. Oral mucosa is moist NECK: is supple, no gross swelling noted. CHEST:  Diminished breath sounds bilaterally. CVS: S1 and S2 heard, no murmur. Regular rate and rhythm.  ABDOMEN: Soft, non-tender, bowel sounds are present. EXTREMITIES: No edema. CNS: Cranial nerves are intact.  Left-sided weakness, communicative, SKIN: warm and dry without rashes.  Data Review: I have personally reviewed the following laboratory data and studies,  CBC: Recent Labs  Lab 04/07/21 1604 04/08/21 0525  WBC 4.6 3.9*  NEUTROABS 3.7  --   HGB 12.6 10.5*  HCT 39.1 32.2*  MCV 104.5* 104.9*  PLT 182 161   Basic Metabolic Panel: Recent Labs  Lab 04/03/21 0457 04/04/21 0802 04/07/21 1604 04/08/21 0525  NA 136 137 140 140  K 4.6 3.9 4.6 3.8  CL 106 107 107 110  CO2 23 22 23  21*  GLUCOSE 130* 117* 115* 105*  BUN 87* 83* 66* 64*  CREATININE 2.70* 2.50* 2.43* 2.36*  CALCIUM 8.6* 8.8* 9.6 9.2   Liver Function Tests: Recent Labs  Lab 04/03/21 0457 04/04/21 0802 04/08/21 0525  AST 17 15 15   ALT 44 36 29  ALKPHOS 57 62 66  BILITOT 0.5 0.4 0.6  PROT 6.1* 6.3* 6.8  ALBUMIN 2.7* 2.8* 2.9*   No results for input(s): LIPASE, AMYLASE in the last 168 hours. No results for input(s): AMMONIA in the last 168 hours. Cardiac Enzymes: No results for input(s): CKTOTAL, CKMB, CKMBINDEX, TROPONINI in the last 168 hours. BNP (last 3 results) No results for input(s): BNP in the last 8760 hours.  ProBNP (last 3 results) No results for input(s): PROBNP in the last 8760 hours.  CBG: No results for input(s): GLUCAP in the last 168 hours. Recent Results (from the past 240  hour(s))  Resp Panel by RT-PCR (Flu A&B, Covid) Nasopharyngeal Swab     Status: None   Collection Time: 04/08/21 12:27 AM   Specimen: Nasopharyngeal Swab; Nasopharyngeal(NP) swabs in vial transport medium  Result Value Ref Range Status   SARS Coronavirus 2 by RT PCR NEGATIVE NEGATIVE Final    Comment: (NOTE) SARS-CoV-2 target nucleic acids are NOT DETECTED.  The SARS-CoV-2 RNA is generally detectable in upper respiratory specimens during the acute phase of infection. The lowest concentration of SARS-CoV-2 viral copies this assay can detect is 138 copies/mL. A negative result does not preclude SARS-Cov-2 infection and should not be used as the sole basis for treatment or other patient management decisions. A negative result may occur with  improper specimen collection/handling, submission of specimen other than nasopharyngeal swab, presence of viral mutation(s) within the areas targeted by this assay, and inadequate number of viral copies(<138 copies/mL). A negative result must be combined with clinical observations, patient history,  and epidemiological information. The expected result is Negative.  Fact Sheet for Patients:  EntrepreneurPulse.com.au  Fact Sheet for Healthcare Providers:  IncredibleEmployment.be  This test is no t yet approved or cleared by the [place] FDA and  has been authorized for detection and/or diagnosis of SARS-CoV-2 by FDA under an Emergency Use Authorization (EUA). This EUA will remain  in effect (meaning this test can be used) for the duration of the COVID-19 declaration under Section 564(b)(1) of the Act, 21 U.S.C.section 360bbb-3(b)(1), unless the authorization is terminated  or revoked sooner.       Influenza A by PCR NEGATIVE NEGATIVE Final   Influenza B by PCR NEGATIVE NEGATIVE Final    Comment: (NOTE) The Xpert Xpress SARS-CoV-2/FLU/RSV plus assay is intended as an aid in the diagnosis of influenza from  Nasopharyngeal swab specimens and should not be used as a sole basis for treatment. Nasal washings and aspirates are unacceptable for Xpert Xpress SARS-CoV-2/FLU/RSV testing.  Fact Sheet for Patients: EntrepreneurPulse.com.au  Fact Sheet for Healthcare Providers: IncredibleEmployment.be  This test is not yet approved or cleared by the [place] FDA and has been authorized for detection and/or diagnosis of SARS-CoV-2 by FDA under an Emergency Use Authorization (EUA). This EUA will remain in effect (meaning this test can be used) for the duration of the COVID-19 declaration under Section 564(b)(1) of the Act, 21 U.S.C. section 360bbb-3(b)(1), unless the authorization is terminated or revoked.  Performed at [facility], [place] [address] [place], [place] [address]      Studies: No results found.    Flora Lipps, MD  [facility] 04/08/2021  If 7PM-7AM, please contact night-coverage

## 2021-04-08 NOTE — Progress Notes (Signed)
.Transition of Care [facility]) - Emergency Department Mini Assessment   Patient Details  Name: Brenda [name] MRN: [id number] Date of Birth: 07-16-1960  Transition of Care [facility]) CM/SW Contact:    Illene Regulus, LCSW Phone Number: 04/08/2021, 1:08 PM   Clinical Narrative:  TOC CSW spoke with pt and son, Daryel November at bedside. Pt's son stated he is not unable to care for his mother at home. Pt was discharged on 04/05/21 to their son's home. Pt called EMS due to no one was at home with her. Pt's son stated he was at work and received a call that his mother was not at home, and has been taken to the hospital. Pt son stated he does not have the help that he thought he would have. Pt's son told this CSW on 04/05/21, that his GF and uncle that stay in the home will help him. Pt's son is now stating his uncle is unable to help due to needing a caretaker himself. He stated his GF does not live in the home and cannot assist as well.   CSW informed pt's son about private duty care that can come out and assist in the home, which is an out-of-pocket cost. CSW also informed pt's son to get in contact with pt's Medicaid worker to see about CAP services. Pt's son requested some contact numbers for [facility] agencies. CSW obtained numbers and brought contacts to pt's son, pt son is now stating pt cannot come back to his home. Pt stated he lives with a friend and his name is not on the lease. Pt's son stated " she is not allowed back in that place" and  "they don't want to get evicted"  CSW tried to get an understanding of what happened from sat to today but pt's son continues to say "she is not allowed back there"   CSW spoke with the family about Hospice services pt and the family agreed for a referral to be sent out to [facility] for home hospice. CSW informed pt's son several times that pt does not qualify for residential hospice at this time. Pt's son stated, I do not know what home she can discharge to, I just  don't know.   ED Mini Assessment: What brought you to the Emergency Department? : family went out and no one was home.  Barriers to Discharge: ED Facility/Family Refusing to Allow Patient to Return        Interventions which prevented an admission or readmission: Education about diagnosis, Patient counseling    Patient Contact and Communications        ,                 Admission diagnosis:  Altered mental status [R41.82] Patient Active Problem List   Diagnosis Date Noted   Altered mental status 04/07/2021   Protein-calorie malnutrition, severe 03/12/2021   Primary malignant neoplasm of lung with metastasis to brain ([place]) 03/11/2021   Exudative pleural effusion    Sinus tachycardia    Pleural effusion    S/P thoracentesis    Hypomagnesemia    Acute on chronic combined systolic and diastolic congestive heart failure ([place])    Bacteremia associated with intravascular line ([place]) 07/03/2020   Acute blood loss anemia    Renal failure 07/01/2020   AKI (acute kidney injury) ([place])    Metabolic acidosis    Epistaxis    Seizure-like activity ([facility])    Swelling of lower extremity 05/29/2020   Hypoalbuminemia 05/29/2020  Acute on chronic anemia 04/07/2020   Gastrointestinal hemorrhage 04/07/2020   Acute encephalopathy 04/07/2020   Pancytopenia ([place]) 04/07/2020   Polysubstance abuse ([place]) 04/07/2020   Hypokalemia 10/19/2019   Neutropenia ([place]) 10/05/2019   Malignant neoplasm of right lung ([place]) 09/14/2019   Encounter for antineoplastic chemotherapy 09/14/2019   Encounter for antineoplastic immunotherapy 09/14/2019   Goals of care, counseling/discussion 09/14/2019   Tobacco abuse counseling 09/14/2019   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 [place] [address] Phone: [phone] Fax: [phone]

## 2021-04-08 NOTE — ED Notes (Signed)
Pt c/o left arm pain, IV infiltrated, heat applied to arm. IV removed. Fluid changed to run through port.

## 2021-04-09 ENCOUNTER — Inpatient Hospital Stay (HOSPITAL_COMMUNITY): Payer: Medicaid Other

## 2021-04-09 DIAGNOSIS — Z7189 Other specified counseling: Secondary | ICD-10-CM

## 2021-04-09 DIAGNOSIS — Z515 Encounter for palliative care: Secondary | ICD-10-CM

## 2021-04-09 DIAGNOSIS — R531 Weakness: Secondary | ICD-10-CM

## 2021-04-09 LAB — CBC
HCT: 27.5 % — ABNORMAL LOW (ref 36.0–46.0)
Hemoglobin: 8.8 g/dL — ABNORMAL LOW (ref 12.0–15.0)
MCH: 34.2 pg — ABNORMAL HIGH (ref 26.0–34.0)
MCHC: 32 g/dL (ref 30.0–36.0)
MCV: 107 fL — ABNORMAL HIGH (ref 80.0–100.0)
Platelets: 122 10*3/uL — ABNORMAL LOW (ref 150–400)
RBC: 2.57 MIL/uL — ABNORMAL LOW (ref 3.87–5.11)
RDW: 14.7 % (ref 11.5–15.5)
WBC: 4.1 10*3/uL (ref 4.0–10.5)
nRBC: 0 % (ref 0.0–0.2)

## 2021-04-09 LAB — BASIC METABOLIC PANEL
Anion gap: 7 (ref 5–15)
BUN: 56 mg/dL — ABNORMAL HIGH (ref 6–20)
CO2: 20 mmol/L — ABNORMAL LOW (ref 22–32)
Calcium: 8.4 mg/dL — ABNORMAL LOW (ref 8.9–10.3)
Chloride: 114 mmol/L — ABNORMAL HIGH (ref 98–111)
Creatinine, Ser: 2.39 mg/dL — ABNORMAL HIGH (ref 0.44–1.00)
GFR, Estimated: 23 mL/min — ABNORMAL LOW (ref >60)
Glucose, Bld: 99 mg/dL (ref 70–99)
Potassium: 4.2 mmol/L (ref 3.5–5.1)
Sodium: 141 mmol/L (ref 135–145)

## 2021-04-09 LAB — MAGNESIUM: Magnesium: 2 mg/dL (ref 1.7–2.4)
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
## 2021-04-09 MED ORDER — OXYCODONE-ACETAMINOPHEN 5-325 MG PO TABS
1.0000 | ORAL_TABLET | Freq: Four times a day (QID) | ORAL | Status: DC | PRN
  Administered 2021-04-09 – 2021-04-10 (×4): 1 via ORAL
  Administered 2021-04-15: 22:00:00 2 via ORAL
  Filled 2021-04-09: qty 1
  Filled 2021-04-09: qty 2
  Filled 2021-04-09 (×3): qty 1

## 2021-04-09 MED ORDER — ADULT MULTIVITAMIN W/MINERALS CH
1.0000 | ORAL_TABLET | Freq: Every day | ORAL | Status: DC
  Administered 2021-04-09 – 2021-05-10 (×32): 1 via ORAL
  Filled 2021-04-09 (×32): qty 1

## 2021-04-09 MED ORDER — ENSURE ENLIVE PO LIQD
237.0000 mL | Freq: Two times a day (BID) | ORAL | Status: DC
  Administered 2021-04-09 – 2021-05-09 (×51): 237 mL via ORAL
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
## 2021-04-09 NOTE — TOC Initial Note (Signed)
Transition of Care [facility]) - Initial/Assessment Note    Patient Details  Name: Brenda [name] MRN: [id number] Date of Birth: 01-20-1961  Transition of Care [facility]) CM/SW Contact:    Gibran Veselka, Marjie Skiff, RN Phone Number: 04/09/2021, 2:14 PM  Clinical Narrative:                 Faxed pt out for long term care as family has returned to the hospital with pt stating they can't care for her. APS was contacted in the ED due to family hoarding pt's social security checks for her burial instead of using for her care now.    Barriers to Discharge: ED Facility/Family Refusing to Allow Patient to Return     Activities of Daily Living Home Assistive Devices/Equipment: Wheelchair ADL Screening (condition at time of admission) Patient's cognitive ability adequate to safely complete daily activities?: No Is the patient deaf or have difficulty hearing?: No Does the patient have difficulty seeing, even when wearing glasses/contacts?: No Does the patient have difficulty concentrating, remembering, or making decisions?: Yes Patient able to express need for assistance with ADLs?: Yes Does the patient have difficulty dressing or bathing?: Yes Independently performs ADLs?: No Communication: Independent Dressing (OT): Dependent Is this a change from baseline?: Change from baseline, expected to last >3 days Grooming: Needs assistance Is this a change from baseline?: Change from baseline, expected to last >3 days Feeding: Needs assistance Is this a change from baseline?: Change from baseline, expected to last >3 days Bathing: Dependent Is this a change from baseline?: Change from baseline, expected to last >3 days Toileting: Needs assistance Is this a change from baseline?: Change from baseline, expected to last >3days In/Out Bed: Dependent Is this a change from baseline?: Change from baseline, expected to last >3 days Walks in Home: Dependent Is this a change from baseline?: Change from baseline, expected  to last >3 days Does the patient have difficulty walking or climbing stairs?: Yes Weakness of Legs: Left Weakness of Arms/Hands: Left     Admission diagnosis:  Confusion [R41.0] Visual hallucinations [R44.1] Altered mental status [R41.82] Aggression [R46.89] Brain mass [G93.89] Patient Active Problem List   Diagnosis Date Noted   Brain mass 04/08/2021   Altered mental status 04/07/2021   Protein-calorie malnutrition, severe 03/12/2021   Primary malignant neoplasm of lung with metastasis to brain ([place]) 03/11/2021   Exudative pleural effusion    Sinus tachycardia    Pleural effusion    S/P thoracentesis    Hypomagnesemia    Acute on chronic combined systolic and diastolic congestive heart failure ([place])    Bacteremia associated with intravascular line ([place]) 07/03/2020   Acute blood loss anemia    Renal failure 07/01/2020   AKI (acute kidney injury) ([place])    Metabolic acidosis    Epistaxis    Seizure-like activity ([place])    Swelling of lower extremity 05/29/2020   Hypoalbuminemia 05/29/2020   Acute on chronic anemia 04/07/2020   Gastrointestinal hemorrhage 04/07/2020   Acute encephalopathy 04/07/2020   Pancytopenia ([place]) 04/07/2020   Polysubstance abuse ([place]) 04/07/2020   Hypokalemia 10/19/2019   Neutropenia ([place]) 10/05/2019   Malignant neoplasm of right lung ([place]) 09/14/2019   Encounter for antineoplastic chemotherapy 09/14/2019   Encounter for antineoplastic immunotherapy 09/14/2019   Goals of care, counseling/discussion 09/14/2019   Tobacco abuse counseling 09/14/2019   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 ([place])    Alcohol abuse    Acute respiratory failure ([place]) 04/03/2019   Lung mass    Multifocal pneumonia  Major depressive disorder    Major depressive disorder, recurrent episode with mood-congruent psychotic features ([place]) 05/30/2017   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 [place] [address] Phone: [phone] Fax: [phone]     Social Determinants of Health (SDOH) Interventions    Readmission Risk Interventions Readmission Risk Prevention Plan 03/14/2021 07/17/2020 04/11/2020  Transportation Screening Complete Complete Complete  PCP or Specialist Appt within 3-5 Days - - Complete  HRI or [place] - Complete Complete  Social Work Consult for [place] Planning/Counseling - Complete Complete  Palliative Care Screening - Not Applicable Not Applicable  Medication Review Press photographer) Complete Complete Complete  PCP or Specialist appointment within 3-5 days of discharge Complete - -  [place] or Home Care Consult Complete - -  SW Recovery Care/Counseling Consult Complete - -  Palliative Care Screening Complete - -  [place] Patient Refused - -  Some recent data might be hidden

## 2021-04-09 NOTE — Evaluation (Signed)
Physical Therapy Evaluation Patient Details Name: Brenda [name] MRN: [id number] DOB: 08-16-1960 Today's Date: 04/09/2021  History of Present Illness   Patient is 61 y.o. female with stage IV lung cancer brought back to [facility] by family due to inability to care for patient. Patient was recently admitted hospital for lung cancer with brain metastasis and had undergone radiation treatment and was discharged home on steroids.  PMH significant for depression, breast cancer, stage IV lung cancer with brain mets and recurrent falls.    Clinical Impression  Brenda [name] is 61 y.o. female admitted with above HPI and diagnosis. Patient is currently limited by functional impairments below (see PT problem list). Patient with significant decline in mobility requiring Max assist to complete bed mobility and unsafe to attempt transfers without +2 assist. Pt limited by poor motor planning and difficulty sequencing. Patient will benefit from continued skilled PT interventions to address impairments and progress independence with mobility, recommending SNF. Acute PT will follow and progress as able.        Recommendations for follow up therapy are one component of a multi-disciplinary discharge planning process, led by the attending physician.  Recommendations may be updated based on patient status, additional functional criteria and insurance authorization.  Follow Up Recommendations Skilled nursing-short term rehab (<3 hours/day)    Assistance Recommended at Discharge Frequent or constant Supervision/Assistance  Patient can return home with the following  Two people to help with walking and/or transfers;Two people to help with bathing/dressing/bathroom;Assistance with feeding;Assistance with cooking/housework;Direct supervision/assist for medications management;Assist for transportation;Help with stairs or ramp for entrance;Direct supervision/assist for financial management    Equipment Recommendations None  recommended by PT  Recommendations for Other Services       Functional Status Assessment Patient has had a recent decline in their functional status and/or demonstrates limited ability to make significant improvements in function in a reasonable and predictable amount of time     Precautions / Restrictions Precautions Precautions: Fall Precaution Comments: left hemiparesis, left neglect, inattention Restrictions Weight Bearing Restrictions: No      Mobility  Bed Mobility Overal bed mobility: Needs Assistance Bed Mobility: Rolling, Supine to Sit, Sit to Supine Rolling: Mod assist, Max assist   Supine to sit: Max assist, HOB elevated Sit to supine: Max assist, HOB elevated     Pt with impaired motor planning and poor processing to follow cues for bed mobility. Pt required repeated verbal/tactile cues to bring LE's off EOB but continued to return LE's back onto bed. Max assist with bed pad to help pivot hips and bring LE's off fully. Max assist to raise trunk up all the way. pt with significant posterior lean and required anywhere from min-max assist to maintain balance at EOB. Returned to supine and Mod-Max assist to roll for repositioning bed pad.    Transfers                        Ambulation/Gait                  Stairs            Wheelchair Mobility    Modified Rankin (Stroke Patients Only)       Balance Overall balance assessment: Needs assistance Sitting-balance support: Bilateral upper extremity supported, Feet supported Sitting balance-Leahy Scale: Poor Sitting balance - Comments: required assist to maintain balance at EOB, posterio lean Postural control: Posterior lean  Pertinent Vitals/Pain Pain Assessment Pain Assessment: Faces Faces Pain Scale: No hurt Pain Intervention(s): Monitored during session    Home Living Family/patient expects to be discharged to:: Skilled nursing  facility Living Arrangements: Other relatives Available Help at Discharge: Friend(s) Type of Home: Apartment Home Access: Stairs to enter Entrance Stairs-Rails: Right Entrance Stairs-Number of Steps: 14-16   Home Layout: One level Home Equipment: Wheelchair - manual Additional Comments: Pt reports that she lives with fiance and niece stays with them sometimes or pt stays with her. describes living with boyfriend despite being discahrged to stay with son after las admission.    Prior Function Prior Level of Function : History of Falls (last six months);Patient poor historian/Family not available             Mobility Comments: Per Prior admissionL pt reports having 2 wheelchairs (sounds like manual and Transport chair) and uses WC in home or furniture cruises around her apartment about 50/50. (Uncertain how mobility was when pt returned home for ~ 1 day)       Hand Dominance   Dominant Hand: Right    Extremity/Trunk Assessment   Upper Extremity Assessment Upper Extremity Assessment: Defer to OT evaluation;Generalized weakness LUE Deficits / Details: pt wearing splint from last hospital admission    Lower Extremity Assessment Lower Extremity Assessment: Generalized weakness;LLE deficits/detail LLE Sensation: decreased proprioception LLE Coordination: decreased gross motor;decreased fine motor    Cervical / Trunk Assessment Cervical / Trunk Assessment: Kyphotic  Communication   Communication: No difficulties  Cognition Arousal/Alertness: Awake/alert Behavior During Therapy: WFL for tasks assessed/performed Overall Cognitive Status: Impaired/Different from baseline Area of Impairment: Attention, Memory, Following commands, Safety/judgement, Awareness, Problem solving, Orientation                 Orientation Level: Situation Current Attention Level: Sustained Memory: Decreased short-term memory, Decreased recall of precautions Following Commands: Follows one step  commands with increased time, Follows one step commands inconsistently Safety/Judgement: Decreased awareness of safety, Decreased awareness of deficits Awareness: Emergent Problem Solving: Slow processing, Decreased initiation, Difficulty sequencing, Requires verbal cues, Requires tactile cues          General Comments      Exercises     Assessment/Plan    PT Assessment Patient needs continued PT services  PT Problem List Decreased mobility;Decreased coordination;Decreased balance;Decreased activity tolerance;Decreased cognition;Decreased strength;Decreased safety awareness;Decreased knowledge of precautions       PT Treatment Interventions Therapeutic activities;Therapeutic exercise;Gait training;Functional mobility training;Patient/family education;Balance training;DME instruction    PT Goals (Current goals can be found in the Care Plan section)  Acute Rehab PT Goals Patient Stated Goal: none stated PT Goal Formulation: With patient Time For Goal Achievement: 04/10/21 Potential to Achieve Goals: Fair    Frequency Min 2X/week     Co-evaluation               AM-PAC PT "6 Clicks" Mobility  Outcome Measure Help needed turning from your back to your side while in a flat bed without using bedrails?: A Lot Help needed moving from lying on your back to sitting on the side of a flat bed without using bedrails?: A Lot Help needed moving to and from a bed to a chair (including a wheelchair)?: Total Help needed standing up from a chair using your arms (e.g., wheelchair or bedside chair)?: Total Help needed to walk in hospital room?: Total Help needed climbing 3-5 steps with a railing? : Total 6 Click Score: 8    End of Session  Equipment Utilized During Treatment: Gait belt Activity Tolerance: Patient tolerated treatment well;No increased pain Patient left: in bed;with call bell/phone within reach;with bed alarm set (chair position) Nurse Communication: Mobility  status PT Visit Diagnosis: Difficulty in walking, not elsewhere classified (R26.2);History of falling (Z91.81);Unsteadiness on feet (R26.81);Other abnormalities of gait and mobility (R26.89);Muscle weakness (generalized) (M62.81)    Time: 1655-3748 PT Time Calculation (min) (ACUTE ONLY): 24 min   Charges:   PT Evaluation $PT Eval Moderate Complexity: 1 Mod PT Treatments $Therapeutic Activity: 8-22 mins        Verner Mould, DPT Acute Rehabilitation Services Office [phone] Pager [phone]   Jacques Navy 04/09/2021, 2:27 PM

## 2021-04-09 NOTE — Consult Note (Signed)
Consultation Note Date: 04/09/2021   Patient Name: Brenda [name]  DOB: 14-Dec-1960  MRN: [id number]  Age / Sex: 61 y.o., female  PCP: Pcp, No Referring Physician: Flora Lipps, MD  Reason for Consultation: Establishing goals of care  HPI/Patient Profile: 61 y.o. female   admitted on 04/07/2021    Clinical Assessment and Goals of Care:  61 yo lady with stage IV lung cancer, known to PMT from previous hospitalization, was recently discharged from the hospital, has brain mets, was on radiation recently, admitted with agitation, likely secondary to steroids, ongoing L sided weakness. Patient has history of substance use and III CKD as well as underlying chronic combined systolic and diastolic congestive heart failure.  Patient is awake alert resting in bed, she states she is feeling better and that her strength is "coming back". She recalls that she sees Dr Earlie Server for cancer. I introduced myself and palliative care as follows: Palliative medicine is specialized medical care for people living with serious illness. It focuses on providing relief from the symptoms and stress of a serious illness. The goal is to improve quality of life for both the patient and the family. Goals of care: Broad aims of medical therapy in relation to the patient's values and preferences. Our aim is to provide medical care aimed at enabling patients to achieve the goals that matter most to them, given the circumstances of their particular medical situation and their constraints.   I discussed with Ms Salsberry about her current condition, and attempted to discuss goals of care, patient states she wants to get stronger and that she wants to talk with Dr Earlie Server.   Palliative team will continue to follow. Thank you for the consult.   NEXT OF KIN    SUMMARY OF RECOMMENDATIONS    Remains full code, does not have much insight into the  serious incurable nature of her condition.  Recommend SNF rehab with palliative services following after discharge.  Thank you for the consult.   Code Status/Advance Care Planning: Full code   Symptom Management:     Palliative Prophylaxis:  Delirium Protocol  Additional Recommendations (Limitations, Scope, Preferences): Full Scope Treatment  Psycho-social/Spiritual:  Desire for further Chaplaincy support:yes Additional Recommendations: Caregiving  Support/Resources  Prognosis:  Unable to determine  Discharge Planning: To Be Determined      Primary Diagnoses: Present on Admission:  Altered mental status   I have reviewed the medical record, interviewed the patient and family, and examined the patient. The following aspects are pertinent.  Past Medical History:  Diagnosis Date   Breast cancer ([place])    Depression    Mental disorder    Social History   Socioeconomic History   Marital status: Legally Separated    Spouse name: Not on file   Number of children: Not on file   Years of education: Not on file   Highest education level: Not on file  Occupational History   Not on file  Tobacco Use   Smoking status: Former    Packs/day:  0.50    Years: 30.00    Pack years: 15.00    Types: Cigarettes    Quit date: 05/27/2019    Years since quitting: 1.8   Smokeless tobacco: Never  Vaping Use   Vaping Use: Never used  Substance and Sexual Activity   Alcohol use: Yes    Alcohol/week: 84.0 standard drinks    Types: 84 Cans of beer per week   Drug use: Yes    Types: Cocaine   Sexual activity: Not on file  Other Topics Concern   Not on file  Social History Narrative   ** Merged History Encounter **       Social Determinants of Health   Financial Resource Strain: Not on file  Food Insecurity: Not on file  Transportation Needs: Not on file  Physical Activity: Not on file  Stress: Not on file  Social Connections: Not on file   Family History  Problem  Relation Age of Onset   Cancer Mother    Heart attack Father        thinks he had MI in his 23s; died from saw accident    Scheduled Meds:  carvedilol  6.25 mg Oral BID   feeding supplement  237 mL Oral BID BM   folic acid  1 mg Oral Daily   gabapentin  300 mg Oral QHS   heparin  5,000 Units Subcutaneous Q8H   ipratropium-albuterol  3 mL Nebulization BID   levETIRAcetam  500 mg Oral BID   multivitamin with minerals  1 tablet Oral Daily   pantoprazole  40 mg Oral BID   polyethylene glycol  17 g Oral BID   sodium chloride flush  3 mL Intravenous Q12H   thiamine  100 mg Oral Daily   Continuous Infusions:  sodium chloride     PRN Meds:.sodium chloride, acetaminophen, albuterol, alum & mag hydroxide-simeth, bisacodyl, hydrALAZINE, LORazepam, oxyCODONE-acetaminophen, sodium chloride flush Medications Prior to Admission:  Prior to Admission medications   Medication Sig Start Date End Date Taking? Authorizing Provider  carvedilol (COREG) 6.25 MG tablet Take 1 tablet (6.25 mg total) by mouth 2 (two) times daily. 04/05/21 05/05/21 Yes Donne Hazel, MD  dexamethasone (DECADRON) 4 MG tablet Take 1 tablet (4 mg total) by mouth every 8 (eight) hours. 04/05/21 05/05/21 Yes Donne Hazel, MD  levETIRAcetam (KEPPRA) 500 MG tablet Take 1 tablet (500 mg total) by mouth 2 (two) times daily. 04/05/21 05/05/21 Yes Donne Hazel, MD  acetaminophen (TYLENOL) 325 MG tablet Take 2 tablets (650 mg total) by mouth every 6 (six) hours as needed for mild pain, moderate pain or headache. Patient not taking: Reported on 03/12/2021 04/11/20   Domenic Polite, MD  ferrous sulfate 325 (65 FE) MG tablet TAKE 1 TABLET (325 MG TOTAL) BY MOUTH 2 (TWO) TIMES DAILY WITH A MEAL. Patient not taking: Reported on 03/12/2021 04/11/20 04/11/21  Domenic Polite, MD  folic acid (FOLVITE) 1 MG tablet TAKE 1 TABLET (1 MG TOTAL) BY MOUTH DAILY. Patient not taking: Reported on 03/12/2021 04/11/20 04/11/21  Domenic Polite, MD  gabapentin  (NEURONTIN) 300 MG capsule Take 1 capsule (300 mg total) by mouth at bedtime. Patient not taking: Reported on 03/12/2021 06/11/20   Harle Stanford., PA-C  pantoprazole (PROTONIX) 40 MG tablet Take 1 tablet (40 mg total) by mouth 2 (two) times daily. Patient not taking: Reported on 03/12/2021 08/16/20 08/16/21  Eugenie Filler, MD  polyethylene glycol (MIRALAX / GLYCOLAX) 17 g packet Dissolve 1 packet (17  g) in liquid and drink 2 (two) times daily. Patient not taking: Reported on 03/12/2021 07/26/20   Arrien, Jimmy Picket, MD  thiamine 100 MG tablet Take 1 tablet (100 mg total) by mouth daily. Patient not taking: Reported on 03/12/2021 08/16/20   Eugenie Filler, MD  prochlorperazine (COMPAZINE) 10 MG tablet Take 1 tablet (10 mg total) by mouth every 6 (six) hours as needed for nausea or vomiting. Patient not taking: Reported on 04/08/2020 01/17/20 04/11/20  Heilingoetter, Cassandra L, PA-C   Allergies  Allergen Reactions   Aspirin Adult Low [Aspirin] Other (See Comments)    Stomach upset   Review of Systems Denies pain  Physical Exam Awake alert Responds appropriately No distress Has weakness Regular work of breathing S 1 S 2  Abdomen not tender  Vital Signs: BP 94/67    Pulse 87    Temp 97.6 F (36.4 C) (Oral)    Resp 18    Ht 5\' 6"  (1.676 m)    Wt 50.1 kg    LMP 01/19/2011    SpO2 100%    BMI 17.83 kg/m  Pain Scale: 0-10   Pain Score: Asleep   SpO2: SpO2: 100 % O2 Device:SpO2: 100 % O2 Flow Rate: .   IO: Intake/output summary:  Intake/Output Summary (Last 24 hours) at 04/09/2021 1500 Last data filed at 04/09/2021 1008 Gross per 24 hour  Intake 360 ml  Output 200 ml  Net 160 ml    LBM: Last BM Date:  (PTA (pt unable to recall-) Baseline Weight: Weight: 49.2 kg Most recent weight: Weight: 50.1 kg     Palliative Assessment/Data:   PPS 40%  Time In:  1400 Time Out:  1500 Time Total:  60  Greater than 50%  of this time was spent counseling and coordinating care  related to the above assessment and plan.  Signed by: Loistine Chance, MD   Please contact Palliative Medicine Team phone at [phone] for questions and concerns.  For individual provider: See Shea Evans

## 2021-04-09 NOTE — Progress Notes (Signed)
TOC CSW received call from APS intake worker Buzzy Han, reporting pt APS report has been accepted, and a SW is assigned.   Brenda [name].Brenda [name], MSW, [place]   Transitions of Care Clinical Social Worker I Direct Dial: [phone]   Fax: [phone] [name]

## 2021-04-09 NOTE — Progress Notes (Signed)
Initial Nutrition Assessment  DOCUMENTATION CODES:   Underweight, Severe malnutrition in context of chronic illness  INTERVENTION:   -Ensure Plus High Protein po BID, each supplement provides 350 kcal and 20 grams of protein.   -Magic cup BID with meals, each supplement provides 290 kcal and 9 grams of protein  -Multivitamin with minerals daily  NUTRITION DIAGNOSIS:   Severe Malnutrition related to chronic illness, cancer and cancer related treatments as evidenced by energy intake < or equal to 75% for > or equal to 1 month, mild fat depletion, severe muscle depletion.  GOAL:   Patient will meet greater than or equal to 90% of their needs  MONITOR:   PO intake, Supplement acceptance, Labs, Weight trends, I & O's  REASON FOR ASSESSMENT:   Consult Assessment of nutrition requirement/status  ASSESSMENT:   61 y.o. female with past medical history of stage IV lung cancer with recurrent falls who was recently discharged from the hospital after being treated for metastatic lung cancer with brain mets.  Patient in room, laying in bed. She had eaten most of her breakfast tray of french toast, cream of wheat and bacon. Pt ate 90% of her dinner last night.  Pt was just recently discharged from [facility] on 1/14. Pt's intakes were good at that time.  Pt wasn't eating as well once she was home. She was not drinking Ensure either.  Will resume Ensure this admission. Will also add Magic Cups to lunch and dinner as well.   Per weight records, pt's weight increased since 12/21 (which was when she first initially was admitted to [facility]).  Medications: Folic acid, Miralax, thiamine  Labs reviewed.  NUTRITION - FOCUSED PHYSICAL EXAM:  Flowsheet Row Most Recent Value  Orbital Region Mild depletion  Upper Arm Region Severe depletion  Thoracic and Lumbar Region Mild depletion  Buccal Region Mild depletion  Temple Region Mild depletion  Clavicle Bone Region Moderate depletion  Clavicle and  Acromion Bone Region Moderate depletion  Scapular Bone Region Moderate depletion  Dorsal Hand Moderate depletion  [right hand, left hand in brace]  Patellar Region Severe depletion  Anterior Thigh Region Severe depletion  Posterior Calf Region Severe depletion  Edema (RD Assessment) None  Hair Reviewed  [lost most hair]  Eyes Reviewed  Mouth Reviewed  [poor dentition]  Skin Reviewed  Nails Reviewed       Diet Order:   Diet Order             DIET SOFT Room service appropriate? Yes; Fluid consistency: Thin  Diet effective now                   EDUCATION NEEDS:   No education needs have been identified at this time  Skin:  Skin Assessment: Reviewed RN Assessment  Last BM:  PTA  Height:   Ht Readings from Last 1 Encounters:  04/09/21 5\' 6"  (1.676 m)    Weight:   Wt Readings from Last 1 Encounters:  04/09/21 50.1 kg    BMI:  Body mass index is 17.83 kg/m.  Estimated Nutritional Needs:   Kcal:  2000-2200  Protein:  90-100g  Fluid:  2L/day  Clayton Bibles, MS, RD, LDN Inpatient Clinical Dietitian Contact information available via Amion

## 2021-04-10 ENCOUNTER — Inpatient Hospital Stay (HOSPITAL_COMMUNITY): Payer: Medicaid Other

## 2021-04-10 DIAGNOSIS — M79604 Pain in right leg: Secondary | ICD-10-CM
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
## 2021-04-10 NOTE — TOC Progression Note (Addendum)
Transition of Care [facility]) - Progression Note    Patient Details  Name: Brenda [name] MRN: [id number] Date of Birth: 10-22-1960  Transition of Care [facility]) CM/SW Contact  Andras Grunewald, Marjie Skiff, RN Phone Number: 04/10/2021, 10:45 AM  Clinical Narrative:    APS CSW Marsh Dolly [phone] contacted this CM with questions about pt. All questions answered to the best of my ability.     Barriers to Discharge: ED Facility/Family Refusing to Allow Patient to Return   Readmission Risk Interventions Readmission Risk Prevention Plan 03/14/2021 07/17/2020 04/11/2020  Transportation Screening Complete Complete Complete  PCP or Specialist Appt within 3-5 Days - - Complete  HRI or [place] - Complete Complete  Social Work Consult for [place] Planning/Counseling - Complete Complete  Palliative Care Screening - Not Applicable Not Applicable  Medication Review Press photographer) Complete Complete Complete  PCP or Specialist appointment within 3-5 days of discharge Complete - -  [place] or Home Care Consult Complete - -  SW Recovery Care/Counseling Consult Complete - -  Palliative Care Screening Complete - -  [place] Patient Refused - -  Some recent data might be hidden

## 2021-04-10 NOTE — TOC Progression Note (Addendum)
Transition of Care [facility]) - Progression Note    Patient Details  Name: Brenda [name] MRN: [id number] Date of Birth: [birth year]  Transition of Care [facility]) CM/SW Contact  Joaquin Courts, RN Phone Number: 04/10/2021, 1:02 PM  Clinical Narrative:   Calls placed to [facility], [facility], [facility], [facility], and [facility].  VM left.  Spoke with [facility] rep regarding referral, per rep patient not a candidate for  [facility] in [place], but will review for sister facilities in the state. [facility] available.      Barriers to Discharge: ED Facility/Family Refusing to Allow Patient to Return  Expected Discharge Plan and Services                                                 Social Determinants of Health (SDOH) Interventions    Readmission Risk Interventions Readmission Risk Prevention Plan 03/14/2021 07/17/2020 04/11/2020  Transportation Screening Complete Complete Complete  PCP or Specialist Appt within 3-5 Days - - Complete  HRI or [place] - Complete Complete  Social Work Consult for [place] Planning/Counseling - Complete Complete  Palliative Care Screening - Not Applicable Not Applicable  Medication Review Press photographer) Complete Complete Complete  PCP or Specialist appointment within 3-5 days of discharge Complete - -  [place] or Home Care Consult Complete - -  SW Recovery Care/Counseling Consult Complete - -  Palliative Care Screening Complete - -  [place] Patient Refused - -  Some recent data might be hidden

## 2021-04-10 NOTE — Progress Notes (Signed)
RLE venous duplex has been completed.  Results can be found under chart review under CV PROC. 04/10/2021 1:17 PM Doranne Schmutz RVT, RDMS

## 2021-04-10 NOTE — NC FL2 (Signed)
[place] LEVEL OF CARE SCREENING TOOL     IDENTIFICATION  Patient Name: Brenda [name] Birthdate: 29-Aug-1960 Sex: female Admission Date (Current Location): 04/07/2021  [facility] and [place] Number:  Herbalist and Address:  [facility],  [place], [place]      Provider Number: [id number]  Attending Physician Name and Address:  Flora Lipps, MD  Relative Name and Phone Number:       Current Level of Care: Hospital Recommended Level of Care: [place] Prior Approval Number:    Date Approved/Denied:   PASRR Number: [id number]  Discharge Plan: SNF    Current Diagnoses: Patient Active Problem List   Diagnosis Date Noted   Brain mass 04/08/2021   Altered mental status 04/07/2021   Protein-calorie malnutrition, severe 03/12/2021   Primary malignant neoplasm of lung with metastasis to brain ([place]) 03/11/2021   Exudative pleural effusion    Sinus tachycardia    Pleural effusion    S/P thoracentesis    Hypomagnesemia    Acute on chronic combined systolic and diastolic congestive heart failure ([place])    Bacteremia associated with intravascular line ([place]) 07/03/2020   Acute blood loss anemia    Renal failure 07/01/2020   AKI (acute kidney injury) ([place])    Metabolic acidosis    Epistaxis    Seizure-like activity (HCC)    Swelling of lower extremity 05/29/2020   Hypoalbuminemia 05/29/2020   Acute on chronic anemia 04/07/2020   Gastrointestinal hemorrhage 04/07/2020   Acute encephalopathy 04/07/2020   Pancytopenia ([place]) 04/07/2020   Polysubstance abuse ([place]) 04/07/2020   Hypokalemia 10/19/2019   Neutropenia ([place]) 10/05/2019   Malignant neoplasm of right lung ([place]) 09/14/2019   Encounter for antineoplastic chemotherapy 09/14/2019   Encounter for antineoplastic immunotherapy 09/14/2019   Goals of care, counseling/discussion 09/14/2019   Tobacco abuse counseling 09/14/2019   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 ([place])     Alcohol abuse    Acute respiratory failure ([place]) 04/03/2019   Lung mass    Multifocal pneumonia    Major depressive disorder    Major depressive disorder, recurrent episode with mood-congruent psychotic features ([place]) 05/30/2017   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/26/2011    Orientation RESPIRATION BLADDER Height & Weight     Self, Place  Normal Incontinent Weight: 50 kg Height:  5\' 6"  (167.6 cm)  BEHAVIORAL SYMPTOMS/MOOD NEUROLOGICAL BOWEL NUTRITION STATUS      Incontinent Diet (Soft diet)  AMBULATORY STATUS COMMUNICATION OF NEEDS Skin   Extensive Assist Verbally Normal                       Personal Care Assistance Level of Assistance  Bathing, Feeding, Dressing Bathing Assistance: Limited assistance Feeding assistance: Independent Dressing Assistance: Limited assistance     Functional Limitations Info  Sight, Hearing, Speech Sight Info: Impaired Hearing Info: Adequate Speech Info: Adequate    SPECIAL CARE FACTORS FREQUENCY                       Contractures Contractures Info: Not present    Additional Factors Info  Code Status, Allergies Code Status Info: Full Allergies Info: Aspirin           Current Medications (04/10/2021):  This is the current hospital active medication list Current Facility-Administered Medications  Medication Dose Route Frequency Provider Last Rate Last Admin   0.9 %  sodium chloride infusion  250 mL Intravenous PRN Pokhrel, Laxman,  MD       acetaminophen (TYLENOL) tablet 650 mg  650 mg Oral Q6H PRN Pokhrel, Laxman, MD       albuterol (PROVENTIL) (2.5 MG/3ML) 0.083% nebulizer solution 2.5 mg  2.5 mg Nebulization Q2H PRN Pokhrel, Laxman, MD       alum & mag hydroxide-simeth (MAALOX/MYLANTA) 200-200-20 MG/5ML suspension 30 mL  30 mL Oral Q4H PRN Pokhrel, Laxman, MD   30 mL at 04/08/21 2353   bisacodyl (DULCOLAX) suppository 10 mg  10 mg Rectal Daily PRN Pokhrel, Laxman, MD        carvedilol (COREG) tablet 6.25 mg  6.25 mg Oral BID Pokhrel, Laxman, MD   6.25 mg at 04/10/21 0923   feeding supplement (ENSURE ENLIVE / ENSURE PLUS) liquid 237 mL  237 mL Oral BID BM Pokhrel, Laxman, MD   237 mL at 54/09/81 1914   folic acid (FOLVITE) tablet 1 mg  1 mg Oral Daily Pokhrel, Laxman, MD   1 mg at 04/10/21 0923   gabapentin (NEURONTIN) capsule 300 mg  300 mg Oral QHS Pokhrel, Laxman, MD   300 mg at 04/09/21 2118   heparin injection 5,000 Units  5,000 Units Subcutaneous Q8H Pokhrel, Laxman, MD   5,000 Units at 04/10/21 0411   hydrALAZINE (APRESOLINE) injection 10 mg  10 mg Intravenous Q6H PRN Pokhrel, Laxman, MD       ipratropium-albuterol (DUONEB) 0.5-2.5 (3) MG/3ML nebulizer solution 3 mL  3 mL Nebulization BID Pokhrel, Laxman, MD   3 mL at 04/10/21 0600   levETIRAcetam (KEPPRA) tablet 500 mg  500 mg Oral BID Pokhrel, Laxman, MD   500 mg at 04/10/21 7829   LORazepam (ATIVAN) injection 0.5 mg  0.5 mg Intravenous Q4H PRN Pokhrel, Laxman, MD       multivitamin with minerals tablet 1 tablet  1 tablet Oral Daily Pokhrel, Laxman, MD   1 tablet at 04/10/21 0923   oxyCODONE-acetaminophen (PERCOCET/ROXICET) 5-325 MG per tablet 1-2 tablet  1-2 tablet Oral Q6H PRN Pokhrel, Laxman, MD   1 tablet at 04/10/21 0923   pantoprazole (PROTONIX) EC tablet 40 mg  40 mg Oral BID Pokhrel, Laxman, MD   40 mg at 04/10/21 0923   polyethylene glycol (MIRALAX / GLYCOLAX) packet 17 g  17 g Oral BID Pokhrel, Laxman, MD   17 g at 04/10/21 0923   sodium chloride flush (NS) 0.9 % injection 3 mL  3 mL Intravenous Q12H Pokhrel, Laxman, MD   3 mL at 04/10/21 0923   sodium chloride flush (NS) 0.9 % injection 3 mL  3 mL Intravenous PRN Pokhrel, Laxman, MD       thiamine tablet 100 mg  100 mg Oral Daily Pokhrel, Laxman, MD   100 mg at 04/10/21 5621     Discharge Medications: Please see discharge summary for a list of discharge medications.  Relevant Imaging Results:  Relevant Lab Results:   Additional  Information [phone]  Genecis Veley, Marjie Skiff, RN

## 2021-04-11 LAB — CBC
HCT: 27.1 % — ABNORMAL LOW (ref 36.0–46.0)
Hemoglobin: 8.7 g/dL — ABNORMAL LOW (ref 12.0–15.0)
MCH: 34.3 pg — ABNORMAL HIGH (ref 26.0–34.0)
MCHC: 32.1 g/dL (ref 30.0–36.0)
MCV: 106.7 fL — ABNORMAL HIGH (ref 80.0–100.0)
Platelets: 123 10*3/uL — ABNORMAL LOW (ref 150–400)
RBC: 2.54 MIL/uL — ABNORMAL LOW (ref 3.87–5.11)
RDW: 14.6 % (ref 11.5–15.5)
WBC: 3.7 10*3/uL — ABNORMAL LOW (ref 4.0–10.5)
nRBC: 0 % (ref 0.0–0.2)

## 2021-04-11 LAB — BASIC METABOLIC PANEL
Anion gap: 6 (ref 5–15)
BUN: 58 mg/dL — ABNORMAL HIGH (ref 6–20)
CO2: 21 mmol/L — ABNORMAL LOW (ref 22–32)
Calcium: 8.8 mg/dL — ABNORMAL LOW (ref 8.9–10.3)
Chloride: 112 mmol/L — ABNORMAL HIGH (ref 98–111)
Creatinine, Ser: 2.56 mg/dL — ABNORMAL HIGH (ref 0.44–1.00)
GFR, Estimated: 21 mL/min — ABNORMAL LOW (ref >60)
Glucose, Bld: 106 mg/dL — ABNORMAL HIGH (ref 70–99)
Potassium: 4.9 mmol/L (ref 3.5–5.1)
Sodium: 139 mmol/L (ref 135–145)

## 2021-04-11 NOTE — Progress Notes (Addendum)
PROGRESS NOTE    Corvette Orser  GEZ:[id number] DOB: June 14, 1960 DOA: 04/07/2021 PCP: Pcp, No    Brief Narrative:  Mrs. Raimer was admitted to the hospital with the working diagnosis of altered mental status.   61 yo female with the past medical history of stage IV lung cancer. She had a recent hospitalization 03/11/2021 to 04/05/2021. Patient received radiation therapy to brain metastatic lesions along with systemic steroids. At home patient was noted to be agitated and aggressive. She was disorientated and having delirium that prompted her transfer to the hospital. On her initial physical examination patient was agitated, her blood pressure 166/95, HR 85, RR 18 and oxygen saturation 99% with temp 98. Her lungs were clear to auscultation, heart with S1 and S2 present and rhythmic, abdomen soft and non tender.   Na 140, K 4,6, CL 107 bicarb 23, glucose 115, BUN 66 and cr 2,43 Wbc 4,6, hgb 12,6 and hct 39,1 pt 182  SARS COVID 19 negative  Urine analysis with 6-10 wbc sg 1,012.   Patient was placed on supportive medical therapy with improvement of her delirium symptoms.  Palliative care was consulted.  Plan to transfer to SNF.   Assessment & Plan:   Principal Problem:   Altered mental status Active Problems:   Alcohol abuse   Malignant neoplasm of right lung (HCC)   Acute encephalopathy   Protein-calorie malnutrition, severe   Brain mass   Acute metabolic encephalopathy with delirium. Patient today is more calm and cooperative, not agitated, responding questions and following commands. She continue to be very weak and deconditioned.  Plan to continue supportive medical care and transfer to SNF when bed available to continue with therapy. Now off systemic corticosteroids.  Discontinue lorazepam and continue with thiamine.   2. Metastatic lung cancer, brain mets. Patient had radiation therapy,  Continue with keppra and plan to follow up as outpatient  Palliative care.   3.  Systolic heart failure. No clinical signs of exacerbation, continue blood pressure monitoring Continue with carvedilol.  4. Stage 3b CKD  her renal function has been stable, continue close monitoring   5. Anemia of chronic disease, malignancy  Cell count has been stable.   6. Hx of polysubstance abuse no signs of withdrawal, continue neuro checks per unit protocol.   7. Severe protein calorie malnutrition  Continue with nutritional supplement.     Status is: Inpatient  Remains inpatient appropriate because: Pending transfer to SNF   DVT prophylaxis: Enoxaparin   Code Status:    full  Family Communication:   No family at the bedside      Nutrition Status: Nutrition Problem: Severe Malnutrition Etiology: chronic illness, cancer and cancer related treatments Signs/Symptoms: energy intake < or equal to 75% for > or equal to 1 month, mild fat depletion, severe muscle depletion Interventions: Ensure Enlive (each supplement provides 350kcal and 20 grams of protein), MVI      Subjective: Patient continue to be very weak and deconditioned, no nausea or vomiting, no confusion or agitation    Objective: Vitals:   04/10/21 2040 04/11/21 0550 04/11/21 1004 04/11/21 1320  BP: 129/69 107/65  115/70  Pulse: (!) 103 (!) 102  100  Resp: 18 18  16   Temp: 98.6 F (37 C) 98 F (36.7 C)  99.6 F (37.6 C)  TempSrc: Oral Oral  Oral  SpO2: 95% 97% 97% 97%  Weight:  49.1 kg    Height:        Intake/Output Summary (Last 24  hours) at 04/11/2021 1649 Last data filed at 04/11/2021 1300 Gross per 24 hour  Intake 453 ml  Output 1125 ml  Net -672 ml   Filed Weights   04/09/21 0434 04/10/21 0413 04/11/21 0550  Weight: 50.1 kg 50 kg 49.1 kg    Examination:   General:  deconditioned  Neurology: Awake and alert, non focal  E ENT: no pallor, no icterus, oral mucosa  Cardiovascular: heart with S1 and S2 present and rhythmic  Pulmonary: lungs clear to auscultation  Gastrointestinal.  Abdomen soft and not tender, not distended  Skin. No rashes Musculoskeletal: no joint deformities     Data Reviewed: I have personally reviewed following labs and imaging studies  CBC: Recent Labs  Lab 04/07/21 1604 04/08/21 0525 04/09/21 0648 04/11/21 0500  WBC 4.6 3.9* 4.1 3.7*  NEUTROABS 3.7  --   --   --   HGB 12.6 10.5* 8.8* 8.7*  HCT 39.1 32.2* 27.5* 27.1*  MCV 104.5* 104.9* 107.0* 106.7*  PLT 182 154 122* 045*   Basic Metabolic Panel: Recent Labs  Lab 04/07/21 1604 04/08/21 0525 04/09/21 0648 04/11/21 0500  NA 140 140 141 139  K 4.6 3.8 4.2 4.9  CL 107 110 114* 112*  CO2 23 21* 20* 21*  GLUCOSE 115* 105* 99 106*  BUN 66* 64* 56* 58*  CREATININE 2.43* 2.36* 2.39* 2.56*  CALCIUM 9.6 9.2 8.4* 8.8*  MG  --   --  2.0  --    GFR: Estimated Creatinine Clearance: 18.6 mL/min (A) (by C-G formula based on SCr of 2.56 mg/dL (H)). Liver Function Tests: Recent Labs  Lab 04/08/21 0525  AST 15  ALT 29  ALKPHOS 66  BILITOT 0.6  PROT 6.8  ALBUMIN 2.9*   No results for input(s): LIPASE, AMYLASE in the last 168 hours. No results for input(s): AMMONIA in the last 168 hours. Coagulation Profile: No results for input(s): INR, PROTIME in the last 168 hours. Cardiac Enzymes: No results for input(s): CKTOTAL, CKMB, CKMBINDEX, TROPONINI in the last 168 hours. BNP (last 3 results) No results for input(s): PROBNP in the last 8760 hours. HbA1C: No results for input(s): HGBA1C in the last 72 hours. CBG: No results for input(s): GLUCAP in the last 168 hours. Lipid Profile: No results for input(s): CHOL, HDL, LDLCALC, TRIG, CHOLHDL, LDLDIRECT in the last 72 hours. Thyroid Function Tests: No results for input(s): TSH, T4TOTAL, FREET4, T3FREE, THYROIDAB in the last 72 hours. Anemia Panel: No results for input(s): VITAMINB12, FOLATE, FERRITIN, TIBC, IRON, RETICCTPCT in the last 72 hours.    Radiology Studies: I have reviewed all of the imaging during this hospital visit  personally     Scheduled Meds:  carvedilol  6.25 mg Oral BID   feeding supplement  237 mL Oral BID BM   folic acid  1 mg Oral Daily   gabapentin  300 mg Oral QHS   heparin  5,000 Units Subcutaneous Q8H   ipratropium-albuterol  3 mL Nebulization BID   levETIRAcetam  500 mg Oral BID   multivitamin with minerals  1 tablet Oral Daily   pantoprazole  40 mg Oral BID   polyethylene glycol  17 g Oral BID   sodium chloride flush  3 mL Intravenous Q12H   thiamine  100 mg Oral Daily   Continuous Infusions:  sodium chloride       LOS: 3 days        Malillany Kazlauskas Gerome Apley, MD

## 2021-04-11 NOTE — TOC Progression Note (Signed)
Transition of Care [facility]) - Progression Note    Patient Details  Name: Kian Gamarra MRN: [id number] Date of Birth: 1960-09-14  Transition of Care [facility]) CM/SW Contact  Dean Goldner, Marjie Skiff, RN Phone Number: 04/11/2021, 1:53 PM  Clinical Narrative:    Pt continues to have no SNF bed offers. TOC reached out to [employer] and [place] today. Awaiting return communication.     Barriers to Discharge: ED Facility/Family Refusing to Allow Patient to Return  Expected Discharge Plan and Services   Social Determinants of Health (SDOH) Interventions    Readmission Risk Interventions Readmission Risk Prevention Plan 03/14/2021 07/17/2020 04/11/2020  Transportation Screening Complete Complete Complete  PCP or Specialist Appt within 3-5 Days - - Complete  HRI or [place] - Complete Complete  Social Work Consult for [place] Planning/Counseling - Complete Complete  Palliative Care Screening - Not Applicable Not Applicable  Medication Review [employer] photographer) Complete Complete Complete  PCP or Specialist appointment within 3-5 days of discharge Complete - -  [place] or Home Care Consult Complete - -  SW Recovery Care/Counseling Consult Complete - -  Palliative Care Screening Complete - -  [place] Patient Refused - -  Some recent data might be hidden

## 2021-04-12 NOTE — Progress Notes (Addendum)
PROGRESS NOTE    Brenda [name]  AST:[id number] DOB: 1960-11-07 DOA: 04/07/2021 PCP: Pcp, No   Brief Narrative:  61 yo female with the past medical history of stage IV lung cancer. She had a recent hospitalization 03/11/2021 to 04/05/2021. Patient received radiation therapy to brain metastatic lesions along with systemic steroids. At home patient was noted to be agitated and aggressive. She was disorientated and having delirium that prompted her transfer to the hospital. On her initial physical examination patient was agitated, her blood pressure 166/95, HR 85, RR 18 and oxygen saturation 99% with temp 98. Na 140, K 4,6, CL 107 bicarb 23, glucose 115, BUN 66 and cr 2,43, Wbc 4,6, hgb 12,6 and hct 39,1 pt 182 , SARS COVID 19 negative. Urine analysis with 6-10 wbc sg 1,012.  Patient was placed on supportive medical therapy with improvement of her delirium symptoms.  Palliative care was consulted.  Plan to transfer to SNF.   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  Off of steroids at this time.  UDS negative.  TSH: WNL. -Oncology consulted on admission who recommended palliative care/hospice level of care. -continue supportive medical care and transfer to SNF when bed available to continue with therapy. -Continue thiamine   History of stage IV metastatic lung cancer, brain mets with left-sided weakness: - Patient had radiation therapy Between 03/18/2021 to 04/01/2021 and subsequently placed on Decadron.  Was seen by neurosurgery in the previous visit and had declined surgical intervention. -Continue with keppra and plan to follow up as outpatient  -Appreciate palliative care consult.  Patient would like to remain full code.   History of chronic combined systolic and diastolic CHF: Last ejection fraction: 40 to 45%. - No clinical signs of exacerbation, continue blood pressure monitoring -Continue with carvedilol.   Stage  3b CKD: Stable at baseline  anemia of chronic disease, malignancy  -Stable   Hx of polysubstance abuse: -no signs of withdrawal, continue neuro checks per unit protocol.  -Continue thiamine   Severe protein calorie malnutrition  -Continue with nutritional supplement.   History of recurrent falls/deconditioning and debility: -TOC on board.  Plan is for SNF placement  DVT prophylaxis: Heparin Code Status: Full code Family Communication: Patient's son at the bedside sleeping.  Plan of care discussed with patient in length and she verbalized understanding and agreed with it. Disposition Plan: SNF  Consultants:  Palliative care  Procedures:  None  Antimicrobials:  None  Status is: Inpatient   Subjective: Patient seen and examined.  Lying comfortably on the bed.  Tells me that she is tired.  Communicating appropriately.  Remained afebrile.  No acute events overnight.  Son at the bedside sleeping.  Objective: Vitals:   04/11/21 2149 04/12/21 0644 04/12/21 0733 04/12/21 0900  BP:  109/66  98/73  Pulse:  97  97  Resp:    16  Temp:  98.9 F (37.2 C)  99.1 F (37.3 C)  TempSrc:  Oral  Oral  SpO2: 100% 98% 99% 100%  Weight:      Height:        Intake/Output Summary (Last 24 hours) at 04/12/2021 1259 Last data filed at 04/12/2021 0600 Gross per 24 hour  Intake 210 ml  Output 1700 ml  Net -1490 ml   Filed Weights   04/09/21 0434 04/10/21 0413 04/11/21 0550  Weight: 50.1 kg 50 kg 49.1 kg    Examination:  General exam: Appears calm and comfortable, on room air,  deconditioned Respiratory system: Clear to auscultation. Respiratory effort normal. Cardiovascular system: S1 & S2 heard, RRR. No JVD, murmurs, rubs, gallops or clicks. No pedal edema. Gastrointestinal system: Abdomen is nondistended, soft and nontender. No organomegaly or masses felt. Normal bowel sounds heard. Central nervous system: Alert and following commands and answers appropriately.  Left-sided  weakness. Skin: No rashes, lesions or ulcers    Data Reviewed: I have personally reviewed following labs and imaging studies  CBC: Recent Labs  Lab 04/07/21 1604 04/08/21 0525 04/09/21 0648 04/11/21 0500  WBC 4.6 3.9* 4.1 3.7*  NEUTROABS 3.7  --   --   --   HGB 12.6 10.5* 8.8* 8.7*  HCT 39.1 32.2* 27.5* 27.1*  MCV 104.5* 104.9* 107.0* 106.7*  PLT 182 154 122* 497*   Basic Metabolic Panel: Recent Labs  Lab 04/07/21 1604 04/08/21 0525 04/09/21 0648 04/11/21 0500  NA 140 140 141 139  K 4.6 3.8 4.2 4.9  CL 107 110 114* 112*  CO2 23 21* 20* 21*  GLUCOSE 115* 105* 99 106*  BUN 66* 64* 56* 58*  CREATININE 2.43* 2.36* 2.39* 2.56*  CALCIUM 9.6 9.2 8.4* 8.8*  MG  --   --  2.0  --    GFR: Estimated Creatinine Clearance: 18.6 mL/min (A) (by C-G formula based on SCr of 2.56 mg/dL (H)). Liver Function Tests: Recent Labs  Lab 04/08/21 0525  AST 15  ALT 29  ALKPHOS 66  BILITOT 0.6  PROT 6.8  ALBUMIN 2.9*   No results for input(s): LIPASE, AMYLASE in the last 168 hours. No results for input(s): AMMONIA in the last 168 hours. Coagulation Profile: No results for input(s): INR, PROTIME in the last 168 hours. Cardiac Enzymes: No results for input(s): CKTOTAL, CKMB, CKMBINDEX, TROPONINI in the last 168 hours. BNP (last 3 results) No results for input(s): PROBNP in the last 8760 hours. HbA1C: No results for input(s): HGBA1C in the last 72 hours. CBG: No results for input(s): GLUCAP in the last 168 hours. Lipid Profile: No results for input(s): CHOL, HDL, LDLCALC, TRIG, CHOLHDL, LDLDIRECT in the last 72 hours. Thyroid Function Tests: No results for input(s): TSH, T4TOTAL, FREET4, T3FREE, THYROIDAB in the last 72 hours. Anemia Panel: No results for input(s): VITAMINB12, FOLATE, FERRITIN, TIBC, IRON, RETICCTPCT in the last 72 hours. Sepsis Labs: No results for input(s): PROCALCITON, LATICACIDVEN in the last 168 hours.  Recent Results (from the past 240 hour(s))  Resp  Panel by RT-PCR (Flu A&B, Covid) Nasopharyngeal Swab     Status: None   Collection Time: 04/08/21 12:27 AM   Specimen: Nasopharyngeal Swab; Nasopharyngeal(NP) swabs in vial transport medium  Result Value Ref Range Status   SARS Coronavirus 2 by RT PCR NEGATIVE NEGATIVE Final    Comment: (NOTE) SARS-CoV-2 target nucleic acids are NOT DETECTED.  The SARS-CoV-2 RNA is generally detectable in upper respiratory specimens during the acute phase of infection. The lowest concentration of SARS-CoV-2 viral copies this assay can detect is 138 copies/mL. A negative result does not preclude SARS-Cov-2 infection and should not be used as the sole basis for treatment or other patient management decisions. A negative result may occur with  improper specimen collection/handling, submission of specimen other than nasopharyngeal swab, presence of viral mutation(s) within the areas targeted by this assay, and inadequate number of viral copies(<138 copies/mL). A negative result must be combined with clinical observations, patient history, and epidemiological information. The expected result is Negative.  Fact Sheet for Patients:  EntrepreneurPulse.com.au  Fact Sheet for Healthcare  Providers:  IncredibleEmployment.be  This test is no t yet approved or cleared by the [place] and  has been authorized for detection and/or diagnosis of SARS-CoV-2 by FDA under an Emergency Use Authorization (EUA). This EUA will remain  in effect (meaning this test can be used) for the duration of the COVID-19 declaration under Section 564(b)(1) of the Act, 21 U.S.C.section 360bbb-3(b)(1), unless the authorization is terminated  or revoked sooner.       Influenza A by PCR NEGATIVE NEGATIVE Final   Influenza B by PCR NEGATIVE NEGATIVE Final    Comment: (NOTE) The Xpert Xpress SARS-CoV-2/FLU/RSV plus assay is intended as an aid in the diagnosis of influenza from Nasopharyngeal  swab specimens and should not be used as a sole basis for treatment. Nasal washings and aspirates are unacceptable for Xpert Xpress SARS-CoV-2/FLU/RSV testing.  Fact Sheet for Patients: EntrepreneurPulse.com.au  Fact Sheet for Healthcare Providers: IncredibleEmployment.be  This test is not yet approved or cleared by the [place] FDA and has been authorized for detection and/or diagnosis of SARS-CoV-2 by FDA under an Emergency Use Authorization (EUA). This EUA will remain in effect (meaning this test can be used) for the duration of the COVID-19 declaration under Section 564(b)(1) of the Act, 21 U.S.C. section 360bbb-3(b)(1), unless the authorization is terminated or revoked.  Performed at [facility] [place] [address] [place], [place] [address]       Radiology Studies: No results found.  Scheduled Meds:  carvedilol  6.25 mg Oral BID   feeding supplement  237 mL Oral BID BM   folic acid  1 mg Oral Daily   gabapentin  300 mg Oral QHS   heparin  5,000 Units Subcutaneous Q8H   ipratropium-albuterol  3 mL Nebulization BID   levETIRAcetam  500 mg Oral BID   multivitamin with minerals  1 tablet Oral Daily   pantoprazole  40 mg Oral BID   polyethylene glycol  17 g Oral BID   sodium chloride flush  3 mL Intravenous Q12H   thiamine  100 mg Oral Daily   Continuous Infusions:  sodium chloride       LOS: 4 days   Time spent: 35 minutes   Trayvond Viets Loann Quill, MD [facility]  If 7PM-7AM, please contact night-coverage [email or web address] 04/12/2021, 12:59 PM

## 2021-04-13 MED ORDER — SODIUM CHLORIDE 0.9% FLUSH
10.0000 mL | INTRAVENOUS | Status: DC | PRN
  Administered 2021-04-27: 10 mL

## 2021-04-13 MED ORDER — CHLORHEXIDINE GLUCONATE CLOTH 2 % EX PADS
6.0000 | MEDICATED_PAD | Freq: Every day | CUTANEOUS | Status: DC
  Administered 2021-04-13 – 2021-05-10 (×28): 6 via TOPICAL

## 2021-04-13 MED ORDER — PANTOPRAZOLE SODIUM 40 MG PO TBEC
40.0000 mg | DELAYED_RELEASE_TABLET | Freq: Every day | ORAL | Status: DC
  Administered 2021-04-14 – 2021-05-10 (×27): 40 mg via ORAL
  Filled 2021-04-13 (×28): qty 1

## 2021-04-13 MED ORDER — SODIUM CHLORIDE 0.9% FLUSH
10.0000 mL | Freq: Two times a day (BID) | INTRAVENOUS | Status: DC
  Administered 2021-04-13 – 2021-05-10 (×49): 10 mL

## 2021-04-13 NOTE — Progress Notes (Signed)
Patient refused to reposition through out the day. She was sleeping long intervals. Her son was also sleeping. Explained the importance of changing positions to prevent skin break down, she kept saying not now.

## 2021-04-13 NOTE — Progress Notes (Signed)
PROGRESS NOTE    Brenda [name]  VFI:[id number] DOB: Sep 30, 1960 DOA: 04/07/2021 PCP: Pcp, No   Brief Narrative:  61 yo female with the past medical history of stage IV lung cancer. She had a recent hospitalization 03/11/2021 to 04/05/2021. Patient received radiation therapy to brain metastatic lesions along with systemic steroids. At home patient was noted to be agitated and aggressive. She was disorientated and having delirium that prompted her transfer to the hospital. On her initial physical examination patient was agitated, her blood pressure 166/95, HR 85, RR 18 and oxygen saturation 99% with temp 98. Na 140, K 4,6, CL 107 bicarb 23, glucose 115, BUN 66 and cr 2,43, Wbc 4,6, hgb 12,6 and hct 39,1 pt 182 , SARS COVID 19 negative. Urine analysis with 6-10 wbc sg 1,012.  Patient was placed on supportive medical therapy with improvement of her delirium symptoms.  Palliative care was consulted.  Plan to transfer to SNF.   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  Off of steroids at this time.  UDS negative.  TSH: WNL. -Oncology consulted on admission who recommended palliative care/hospice level of care. -continue supportive medical care and transfer to SNF when bed available to continue with therapy. -Continue thiamine and folic acid.   History of stage IV metastatic lung cancer, brain mets with left-sided weakness: - Patient had radiation therapy Between 03/18/2021 to 04/01/2021 and subsequently placed on Decadron.  Was seen by neurosurgery in the previous visit and had declined surgical intervention. -Continue with keppra and plan to follow up as outpatient.  -Continue as needed oxycodone and gabapentin -Appreciate palliative care consult.  Patient would like to remain full code.   History of chronic combined systolic and diastolic CHF: Last ejection fraction: 40 to 45%. - No clinical signs of exacerbation, continue  blood pressure monitoring -Continue with carvedilol.   Stage 3b CKD: Stable at baseline  anemia of chronic disease, malignancy  -Stable  Thrombocytopenia: Platelet count: 123 -No signs of active bleeding.  Continue to monitor   Hx of polysubstance abuse: -no signs of withdrawal, continue neuro checks per unit protocol.  -Continue thiamine   Severe protein calorie malnutrition  -Continue with nutritional supplement.   History of recurrent falls/deconditioning and debility: -TOC on board.  Plan is for SNF placement.  DVT prophylaxis: Heparin Code Status: Full code Family Communication: Patient's son at the bedside.  Plan of care discussed with patient in length and she verbalized understanding and agreed with it. Disposition Plan: SNF  Consultants:  Palliative care  Procedures:  None  Antimicrobials:  None  Status is: Inpatient   Subjective: Patient seen and examined.  Lying comfortably on the bed.  Sleepy but arousable and answers appropriately.  Son at the bedside.  Patient denies any complaints.  No headache, blurry vision, fever, chills, nausea or vomiting.  Her appetite is good.  No acute events overnight.  Objective: Vitals:   04/12/21 1340 04/12/21 2204 04/13/21 0606 04/13/21 0743  BP: (!) 91/59 129/82 101/65   Pulse: 94 (!) 101 94   Resp: 15 16 16    Temp: 99 F (37.2 C) 99 F (37.2 C) 98.9 F (37.2 C)   TempSrc: Oral Oral Oral   SpO2: 99% 99% 97% 98%  Weight:      Height:        Intake/Output Summary (Last 24 hours) at 04/13/2021 1151 Last data filed at 04/13/2021 1127 Gross per 24 hour  Intake 240 ml  Output  300 ml  Net -60 ml    Filed Weights   04/09/21 0434 04/10/21 0413 04/11/21 0550  Weight: 50.1 kg 50 kg 49.1 kg    Examination:  General exam: Appears calm and comfortable, on room air, deconditioned, son at the bedside Respiratory system: Clear to auscultation. Respiratory effort normal. Cardiovascular system: S1 & S2 heard, RRR. No  JVD, murmurs, rubs, gallops or clicks. No pedal edema. Gastrointestinal system: Abdomen is nondistended, soft and nontender. No organomegaly or masses felt. Normal bowel sounds heard. Central nervous system: Alert and following commands and answers appropriately.  Left-sided weakness. Skin: No rashes, lesions or ulcers    Data Reviewed: I have personally reviewed following labs and imaging studies  CBC: Recent Labs  Lab 04/07/21 1604 04/08/21 0525 04/09/21 0648 04/11/21 0500  WBC 4.6 3.9* 4.1 3.7*  NEUTROABS 3.7  --   --   --   HGB 12.6 10.5* 8.8* 8.7*  HCT 39.1 32.2* 27.5* 27.1*  MCV 104.5* 104.9* 107.0* 106.7*  PLT 182 154 122* 123*    Basic Metabolic Panel: Recent Labs  Lab 04/07/21 1604 04/08/21 0525 04/09/21 0648 04/11/21 0500  NA 140 140 141 139  K 4.6 3.8 4.2 4.9  CL 107 110 114* 112*  CO2 23 21* 20* 21*  GLUCOSE 115* 105* 99 106*  BUN 66* 64* 56* 58*  CREATININE 2.43* 2.36* 2.39* 2.56*  CALCIUM 9.6 9.2 8.4* 8.8*  MG  --   --  2.0  --     GFR: Estimated Creatinine Clearance: 18.6 mL/min (A) (by C-G formula based on SCr of 2.56 mg/dL (H)). Liver Function Tests: Recent Labs  Lab 04/08/21 0525  AST 15  ALT 29  ALKPHOS 66  BILITOT 0.6  PROT 6.8  ALBUMIN 2.9*    No results for input(s): LIPASE, AMYLASE in the last 168 hours. No results for input(s): AMMONIA in the last 168 hours. Coagulation Profile: No results for input(s): INR, PROTIME in the last 168 hours. Cardiac Enzymes: No results for input(s): CKTOTAL, CKMB, CKMBINDEX, TROPONINI in the last 168 hours. BNP (last 3 results) No results for input(s): PROBNP in the last 8760 hours. HbA1C: No results for input(s): HGBA1C in the last 72 hours. CBG: No results for input(s): GLUCAP in the last 168 hours. Lipid Profile: No results for input(s): CHOL, HDL, LDLCALC, TRIG, CHOLHDL, LDLDIRECT in the last 72 hours. Thyroid Function Tests: No results for input(s): TSH, T4TOTAL, FREET4, T3FREE,  THYROIDAB in the last 72 hours. Anemia Panel: No results for input(s): VITAMINB12, FOLATE, FERRITIN, TIBC, IRON, RETICCTPCT in the last 72 hours. Sepsis Labs: No results for input(s): PROCALCITON, LATICACIDVEN in the last 168 hours.  Recent Results (from the past 240 hour(s))  Resp Panel by RT-PCR (Flu A&B, Covid) Nasopharyngeal Swab     Status: None   Collection Time: 04/08/21 12:27 AM   Specimen: Nasopharyngeal Swab; Nasopharyngeal(NP) swabs in vial transport medium  Result Value Ref Range Status   SARS Coronavirus 2 by RT PCR NEGATIVE NEGATIVE Final    Comment: (NOTE) SARS-CoV-2 target nucleic acids are NOT DETECTED.  The SARS-CoV-2 RNA is generally detectable in upper respiratory specimens during the acute phase of infection. The lowest concentration of SARS-CoV-2 viral copies this assay can detect is 138 copies/mL. A negative result does not preclude SARS-Cov-2 infection and should not be used as the sole basis for treatment or other patient management decisions. A negative result may occur with  improper specimen collection/handling, submission of specimen other than nasopharyngeal swab, presence  of viral mutation(s) within the areas targeted by this assay, and inadequate number of viral copies(<138 copies/mL). A negative result must be combined with clinical observations, patient history, and epidemiological information. The expected result is Negative.  Fact Sheet for Patients:  EntrepreneurPulse.com.au  Fact Sheet for Healthcare Providers:  IncredibleEmployment.be  This test is no t yet approved or cleared by the [place] FDA and  has been authorized for detection and/or diagnosis of SARS-CoV-2 by FDA under an Emergency Use Authorization (EUA). This EUA will remain  in effect (meaning this test can be used) for the duration of the COVID-19 declaration under Section 564(b)(1) of the Act, 21 U.S.C.section 360bbb-3(b)(1), unless the  authorization is terminated  or revoked sooner.       Influenza A by PCR NEGATIVE NEGATIVE Final   Influenza B by PCR NEGATIVE NEGATIVE Final    Comment: (NOTE) The Xpert Xpress SARS-CoV-2/FLU/RSV plus assay is intended as an aid in the diagnosis of influenza from Nasopharyngeal swab specimens and should not be used as a sole basis for treatment. Nasal washings and aspirates are unacceptable for Xpert Xpress SARS-CoV-2/FLU/RSV testing.  Fact Sheet for Patients: EntrepreneurPulse.com.au  Fact Sheet for Healthcare Providers: IncredibleEmployment.be  This test is not yet approved or cleared by the [place] FDA and has been authorized for detection and/or diagnosis of SARS-CoV-2 by FDA under an Emergency Use Authorization (EUA). This EUA will remain in effect (meaning this test can be used) for the duration of the COVID-19 declaration under Section 564(b)(1) of the Act, 21 U.S.C. section 360bbb-3(b)(1), unless the authorization is terminated or revoked.  Performed at [facility], [place] [address], [place], [place] [address]        Radiology Studies: No results found.  Scheduled Meds:  carvedilol  6.25 mg Oral BID   Chlorhexidine Gluconate Cloth  6 each Topical Daily   feeding supplement  237 mL Oral BID BM   folic acid  1 mg Oral Daily   gabapentin  300 mg Oral QHS   heparin  5,000 Units Subcutaneous Q8H   ipratropium-albuterol  3 mL Nebulization BID   levETIRAcetam  500 mg Oral BID   multivitamin with minerals  1 tablet Oral Daily   pantoprazole  40 mg Oral BID   polyethylene glycol  17 g Oral BID   sodium chloride flush  10-40 mL Intracatheter Q12H   sodium chloride flush  3 mL Intravenous Q12H   thiamine  100 mg Oral Daily   Continuous Infusions:  sodium chloride       LOS: 5 days   Time spent: 35 minutes   Bilal Manzer Loann Quill, MD [facility]  If 7PM-7AM, please contact  night-coverage [email or web address] 04/13/2021, 11:51 AM

## 2021-04-13 NOTE — TOC Progression Note (Signed)
Transition of Care [facility]) - Progression Note    Patient Details  Name: Brenda [name] MRN: [id number] Date of Birth: 1960-09-17  Transition of Care [facility]) CM/SW Contact  Marcelline Deist Donavan Foil, [place] Phone Number: 04/13/2021, 2:41 PM  Clinical Narrative:    Son at bedside wanting to discuss pt completing HCPOA papers. Uncertain of pt's cognitive ability to sign- discussed with son.  Pt's son also shared he has a video of pt saying her wishes- CSW instructed him he can certainly share that but it likely would not be adequate or legally acceptable. Reassured son that given he is the only child and next of kin decisions would likely come to him if she is unable. Discussed with Dr Damita Lack also.       Barriers to Discharge: ED Facility/Family Refusing to Allow Patient to Return  Expected Discharge Plan and Services                                                 Social Determinants of Health (SDOH) Interventions    Readmission Risk Interventions Readmission Risk Prevention Plan 03/14/2021 07/17/2020 04/11/2020  Transportation Screening Complete Complete Complete  PCP or Specialist Appt within 3-5 Days - - Complete  HRI or [place] - Complete Complete  Social Work Consult for [place] Planning/Counseling - Complete Complete  Palliative Care Screening - Not Applicable Not Applicable  Medication Review Press photographer) Complete Complete Complete  PCP or Specialist appointment within 3-5 days of discharge Complete - -  [place] or Home Care Consult Complete - -  SW Recovery Care/Counseling Consult Complete - -  Palliative Care Screening Complete - -  [place] Patient Refused - -  Some recent data might be hidden   Eduard Clos, MSW, LCSW Clinical Social Worker

## 2021-04-14 DIAGNOSIS — G9340 Encephalopathy, unspecified: Secondary | ICD-10-CM

## 2021-04-14 LAB — CBC
HCT: 23.7 % — ABNORMAL LOW (ref 36.0–46.0)
Hemoglobin: 7.7 g/dL — ABNORMAL LOW (ref 12.0–15.0)
MCH: 34.4 pg — ABNORMAL HIGH (ref 26.0–34.0)
MCHC: 32.5 g/dL (ref 30.0–36.0)
MCV: 105.8 fL — ABNORMAL HIGH (ref 80.0–100.0)
Platelets: 113 10*3/uL — ABNORMAL LOW (ref 150–400)
RBC: 2.24 MIL/uL — ABNORMAL LOW (ref 3.87–5.11)
RDW: 14.1 % (ref 11.5–15.5)
WBC: 3.2 10*3/uL — ABNORMAL LOW (ref 4.0–10.5)
nRBC: 0 % (ref 0.0–0.2)

## 2021-04-14 LAB — BASIC METABOLIC PANEL
Anion gap: 8 (ref 5–15)
BUN: 51 mg/dL — ABNORMAL HIGH (ref 6–20)
CO2: 21 mmol/L — ABNORMAL LOW (ref 22–32)
Calcium: 8.9 mg/dL (ref 8.9–10.3)
Chloride: 107 mmol/L (ref 98–111)
Creatinine, Ser: 2.71 mg/dL — ABNORMAL HIGH (ref 0.44–1.00)
GFR, Estimated: 20 mL/min — ABNORMAL LOW (ref >60)
Glucose, Bld: 101 mg/dL — ABNORMAL HIGH (ref 70–99)
Potassium: 5 mmol/L (ref 3.5–5.1)
Sodium: 136 mmol/L (ref 135–145)

## 2021-04-14 NOTE — TOC Progression Note (Addendum)
Transition of Care [facility]) - Progression Note    Patient Details  Name: Brenda [name] MRN: [id number] Date of Birth: 1961/03/05  Transition of Care [facility]) CM/SW Contact  Saraia Platner, Marjie Skiff, RN Phone Number: 04/14/2021, 2:05 PM  Clinical Narrative:    Spoke with APS CSW Marsh Dolly [phone] about a SNF having possible interest in [place] but they would need pt to have a guardian to assist with finances. Marsh Dolly asked if APS could assign an emergency guardian to assist with having Chamara placed at [facility]. Marsh Dolly to speak with her supervisor and get back to me. TOC will continue to follow.  Addendum: [facility] called and is asking for a psych consult. MD made aware.    Barriers to Discharge: ED Facility/Family Refusing to Allow Patient to Return  Expected Discharge Plan and Services      Social Determinants of Health (SDOH) Interventions    Readmission Risk Interventions Readmission Risk Prevention Plan 03/14/2021 07/17/2020 04/11/2020  Transportation Screening Complete Complete Complete  PCP or Specialist Appt within 3-5 Days - - Complete  HRI or [place] - Complete Complete  Social Work Consult for [place] Planning/Counseling - Complete Complete  Palliative Care Screening - Not Applicable Not Applicable  Medication Review Press photographer) Complete Complete Complete  PCP or Specialist appointment within 3-5 days of discharge Complete - -  [place] or Home Care Consult Complete - -  SW Recovery Care/Counseling Consult Complete - -  Palliative Care Screening Complete - -  [place] Patient Refused - -  Some recent data might be hidden

## 2021-04-14 NOTE — Progress Notes (Addendum)
PROGRESS NOTE    Lynnita Somma  AVW:[id number] DOB: 1960/12/22 DOA: 04/07/2021 PCP: Pcp, No   Brief Narrative:    Marleny Faller is a 61 y.o. female with past medical history of stage IV lung cancer with recurrent falls who was recently discharged from the hospital after being treated for metastatic lung cancer with brain mets was brought into the hospital by her son who reported that she was aggressive, agitated, restless and acting evil and out of it at times as per the son.  She was also talking irrelevant things and looking at picture at the wall.  Patient was recently admitted hospital for lung cancer with brain metastasis and had undergone radiation treatment and was discharged home on steroids.  At this time, patient's son wishes her to be placed as he could not take care of her at home.  During hospitalization patient was treated with supportive care for her delirium symptoms.  Palliative care was consulted.  At this time plan is to discharge to skilled nursing facility.  Assessment & Plan:   Acute metabolic encephalopathy with delirium: Thought to be secondary to steroid-induced psychosis.  UDS was negative.  TSH within normal limits.  Continue thiamine and folic acid.  Transfer to skilled nursing facility when bed available   History of stage IV metastatic lung cancer, brain mets with left-sided weakness: History of palliative radiation between 03/18/2021 to 04/01/2021.  Off steroids.  Continue Keppra oxycodone and gabapentin.   History of chronic combined systolic and diastolic CHF: Continue Coreg.  Compensated.  Last known ejection fraction 40 to 45%.   Stage 3b CKD: Stable at baseline, creatinine today at 2.7  anemia of chronic disease, malignancy  Latest hemoglobin was 7.7.  Thrombocytopenia, pancytopenia:  Mild.  No evidence of bleeding   Hx of polysubstance abuse: Continue thiamine.  No evidence of withdrawal.   Severe protein calorie malnutrition  Continue  nutritional supplements.  History of recurrent falls/deconditioning and debility: Plan is for skilled nursing facility placement.   DVT prophylaxis: Heparin subcu  Code Status: Full code  Family Communication:  Spoke with the patient  Disposition Plan: SNF  Consultants:  Palliative care  Procedures:  None  Antimicrobials:  None  Status is: Inpatient   Subjective: Today, patient was seen and examined at bedside.  Patient denies any nausea vomiting fever chills or rigor.  Has been eating okay.  Objective: Vitals:   04/13/21 0743 04/13/21 1343 04/13/21 2041 04/14/21 0507  BP:  93/64 103/67 (!) 95/56  Pulse:  97 (!) 104 93  Resp:  16 16 18   Temp:  98.4 F (36.9 C) 98.5 F (36.9 C) 99.4 F (37.4 C)  TempSrc:  Oral Oral Oral  SpO2: 98% 100% 100% 100%  Weight:    53.3 kg  Height:        Intake/Output Summary (Last 24 hours) at 04/14/2021 0727 Last data filed at 04/14/2021 0612 Gross per 24 hour  Intake 903 ml  Output 550 ml  Net 353 ml    Filed Weights   04/10/21 0413 04/11/21 0550 04/14/21 0507  Weight: 50 kg 49.1 kg 53.3 kg    Physical examination:  General: Thinly built, not in obvious distress frail-appearing, HENT:   No scleral pallor or icterus noted. Oral mucosa is moist.  Chest:   Diminished breath sounds bilaterally. No crackles or wheezes.  CVS: S1 &S2 heard. No murmur.  Regular rate and rhythm. Abdomen: Soft, nontender, nondistended.  Bowel sounds are heard.   Extremities: No cyanosis,  clubbing or edema.  Peripheral pulses are palpable. Psych: Alert, awake and oriented, normal mood CNS: Left-sided weakness noted. Skin: Warm and dry.  No rashes noted.   Data Reviewed: I have personally reviewed following labs and imaging studies  CBC: Recent Labs  Lab 04/07/21 1604 04/08/21 0525 04/09/21 0648 04/11/21 0500 04/14/21 0506  WBC 4.6 3.9* 4.1 3.7* 3.2*  NEUTROABS 3.7  --   --   --   --   HGB 12.6 10.5* 8.8* 8.7* 7.7*  HCT 39.1 32.2*  27.5* 27.1* 23.7*  MCV 104.5* 104.9* 107.0* 106.7* 105.8*  PLT 182 154 122* 123* 113*    Basic Metabolic Panel: Recent Labs  Lab 04/07/21 1604 04/08/21 0525 04/09/21 0648 04/11/21 0500 04/14/21 0506  NA 140 140 141 139 136  K 4.6 3.8 4.2 4.9 5.0  CL 107 110 114* 112* 107  CO2 23 21* 20* 21* 21*  GLUCOSE 115* 105* 99 106* 101*  BUN 66* 64* 56* 58* 51*  CREATININE 2.43* 2.36* 2.39* 2.56* 2.71*  CALCIUM 9.6 9.2 8.4* 8.8* 8.9  MG  --   --  2.0  --   --     GFR: Estimated Creatinine Clearance: 19 mL/min (A) (by C-G formula based on SCr of 2.71 mg/dL (H)). Liver Function Tests: Recent Labs  Lab 04/08/21 0525  AST 15  ALT 29  ALKPHOS 66  BILITOT 0.6  PROT 6.8  ALBUMIN 2.9*    No results for input(s): LIPASE, AMYLASE in the last 168 hours. No results for input(s): AMMONIA in the last 168 hours. Coagulation Profile: No results for input(s): INR, PROTIME in the last 168 hours. Cardiac Enzymes: No results for input(s): CKTOTAL, CKMB, CKMBINDEX, TROPONINI in the last 168 hours. BNP (last 3 results) No results for input(s): PROBNP in the last 8760 hours. HbA1C: No results for input(s): HGBA1C in the last 72 hours. CBG: No results for input(s): GLUCAP in the last 168 hours. Lipid Profile: No results for input(s): CHOL, HDL, LDLCALC, TRIG, CHOLHDL, LDLDIRECT in the last 72 hours. Thyroid Function Tests: No results for input(s): TSH, T4TOTAL, FREET4, T3FREE, THYROIDAB in the last 72 hours. Anemia Panel: No results for input(s): VITAMINB12, FOLATE, FERRITIN, TIBC, IRON, RETICCTPCT in the last 72 hours. Sepsis Labs: No results for input(s): PROCALCITON, LATICACIDVEN in the last 168 hours.  Recent Results (from the past 240 hour(s))  Resp Panel by RT-PCR (Flu A&B, Covid) Nasopharyngeal Swab     Status: None   Collection Time: 04/08/21 12:27 AM   Specimen: Nasopharyngeal Swab; Nasopharyngeal(NP) swabs in vial transport medium  Result Value Ref Range Status   SARS  Coronavirus 2 by RT PCR NEGATIVE NEGATIVE Final    Comment: (NOTE) SARS-CoV-2 target nucleic acids are NOT DETECTED.  The SARS-CoV-2 RNA is generally detectable in upper respiratory specimens during the acute phase of infection. The lowest concentration of SARS-CoV-2 viral copies this assay can detect is 138 copies/mL. A negative result does not preclude SARS-Cov-2 infection and should not be used as the sole basis for treatment or other patient management decisions. A negative result may occur with  improper specimen collection/handling, submission of specimen other than nasopharyngeal swab, presence of viral mutation(s) within the areas targeted by this assay, and inadequate number of viral copies(<138 copies/mL). A negative result must be combined with clinical observations, patient history, and epidemiological information. The expected result is Negative.  Fact Sheet for Patients:  EntrepreneurPulse.com.au  Fact Sheet for Healthcare Providers:  IncredibleEmployment.be  This test is no t yet  approved or cleared by the [place] and  has been authorized for detection and/or diagnosis of SARS-CoV-2 by FDA under an Emergency Use Authorization (EUA). This EUA will remain  in effect (meaning this test can be used) for the duration of the COVID-19 declaration under Section 564(b)(1) of the Act, 21 U.S.C.section 360bbb-3(b)(1), unless the authorization is terminated  or revoked sooner.       Influenza A by PCR NEGATIVE NEGATIVE Final   Influenza B by PCR NEGATIVE NEGATIVE Final    Comment: (NOTE) The Xpert Xpress SARS-CoV-2/FLU/RSV plus assay is intended as an aid in the diagnosis of influenza from Nasopharyngeal swab specimens and should not be used as a sole basis for treatment. Nasal washings and aspirates are unacceptable for Xpert Xpress SARS-CoV-2/FLU/RSV testing.  Fact Sheet for  Patients: EntrepreneurPulse.com.au  Fact Sheet for Healthcare Providers: IncredibleEmployment.be  This test is not yet approved or cleared by the [place] FDA and has been authorized for detection and/or diagnosis of SARS-CoV-2 by FDA under an Emergency Use Authorization (EUA). This EUA will remain in effect (meaning this test can be used) for the duration of the COVID-19 declaration under Section 564(b)(1) of the Act, 21 U.S.C. section 360bbb-3(b)(1), unless the authorization is terminated or revoked.  Performed at [facility] [place] [address], [place], [place] [address]        Radiology Studies: No results found.  Scheduled Meds:  carvedilol  6.25 mg Oral BID   Chlorhexidine Gluconate Cloth  6 each Topical Daily   feeding supplement  237 mL Oral BID BM   folic acid  1 mg Oral Daily   gabapentin  300 mg Oral QHS   heparin  5,000 Units Subcutaneous Q8H   ipratropium-albuterol  3 mL Nebulization BID   levETIRAcetam  500 mg Oral BID   multivitamin with minerals  1 tablet Oral Daily   pantoprazole  40 mg Oral Daily   polyethylene glycol  17 g Oral BID   sodium chloride flush  10-40 mL Intracatheter Q12H   sodium chloride flush  3 mL Intravenous Q12H   thiamine  100 mg Oral Daily   Continuous Infusions:  sodium chloride       LOS: 6 days    Flora Lipps, MD [facility] If 7PM-7AM, please contact night-coverage [email or web address] 04/14/2021, 7:27 AM

## 2021-04-14 NOTE — Progress Notes (Signed)
Daily Progress Note   Patient Name: Brenda Brenda [name]       Date: 04/14/2021 DOB: 10/11/1960  Age: 61 y.o. MRN#: [id number] Attending Physician: Flora Lipps, MD Primary Care Physician: Pcp, No Admit Date: 04/07/2021  Reason for Consultation/Follow-up: Establishing goals of care  Subjective:  Resting in bed, states that she just needs to rest some more today, denies pain.   Length of Stay: 6  Current Medications: Scheduled Meds:   carvedilol  6.25 mg Oral BID   Chlorhexidine Gluconate Cloth  6 each Topical Daily   feeding supplement  237 mL Oral BID BM   folic acid  1 mg Oral Daily   gabapentin  300 mg Oral QHS   heparin  5,000 Units Subcutaneous Q8H   levETIRAcetam  500 mg Oral BID   multivitamin with minerals  1 tablet Oral Daily   pantoprazole  40 mg Oral Daily   polyethylene glycol  17 g Oral BID   sodium chloride flush  10-40 mL Intracatheter Q12H   sodium chloride flush  3 mL Intravenous Q12H   thiamine  100 mg Oral Daily    Continuous Infusions:  sodium chloride      PRN Meds: sodium chloride, acetaminophen, albuterol, alum & mag hydroxide-simeth, bisacodyl, hydrALAZINE, oxyCODONE-acetaminophen, sodium chloride flush, sodium chloride flush  Physical Exam         Awake alert Denies pain Regular work of breathing  S 1 S 2  Abdomen not tender No edema  Vital Signs: BP (!) 95/56 (BP Location: Right Arm)    Pulse 93    Temp 99.4 F (37.4 C) (Oral)    Resp 18    Ht 5\' 6"  (1.676 m)    Wt 53.3 kg    LMP 01/19/2011    SpO2 97%    BMI 18.97 kg/m  SpO2: SpO2: 97 % O2 Device: O2 Device: Room Air O2 Flow Rate:    Intake/output summary:  Intake/Output Summary (Last 24 hours) at 04/14/2021 1133 Last data filed at 04/14/2021 0930 Gross per 24 hour  Intake 1020 ml  Output  550 ml  Net 470 ml   LBM: Last BM Date: 04/13/21 Baseline Weight: Weight: 49.2 kg Most recent weight: Weight: 53.3 kg       Palliative Assessment/Data:      Patient Active Problem List  Diagnosis Date Noted   Brain mass 04/08/2021   Altered mental status 04/07/2021   Protein-calorie malnutrition, severe 03/12/2021   Primary malignant neoplasm of lung with metastasis to brain ([place]) 03/11/2021   Exudative pleural effusion    Sinus tachycardia    Pleural effusion    S/P thoracentesis    Hypomagnesemia    Acute on chronic combined systolic and diastolic congestive heart failure (HCC)    Bacteremia associated with intravascular line ([place]) 07/03/2020   Acute blood loss anemia    Renal failure 07/01/2020   AKI (acute kidney injury) ([place])    Metabolic acidosis    Epistaxis    Seizure-like activity (HCC)    Swelling of lower extremity 05/29/2020   Hypoalbuminemia 05/29/2020   Acute on chronic anemia 04/07/2020   Gastrointestinal hemorrhage 04/07/2020   Acute encephalopathy 04/07/2020   Pancytopenia ([place]) 04/07/2020   Polysubstance abuse ([place]) 04/07/2020   Hypokalemia 10/19/2019   Neutropenia ([place]) 10/05/2019   Malignant neoplasm of right lung ([place]) 09/14/2019   Encounter for antineoplastic chemotherapy 09/14/2019   Encounter for antineoplastic immunotherapy 09/14/2019   Goals of care, counseling/discussion 09/14/2019   Tobacco abuse counseling 09/14/2019   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 ([place])    Alcohol abuse    Acute respiratory failure ([place]) 04/03/2019   Lung mass    Multifocal pneumonia    Major depressive disorder    Major depressive disorder, recurrent episode with mood-congruent psychotic features ([place]) 05/30/2017   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  Palliative medicine consulted for goals of care discussions.  Recommendations/Plan:  Recommend SNF rehab with palliative care following.  Attempted CODE STATUS and goals of care discussions with patient.  She states that she is agreeable to going to skilled nursing facility and wants to work on physical therapy.  She does not have much insight into the serious nature of her condition.  No other recommendations other than for outpatient palliative support. Code Status:    Code Status Orders  (From admission, onward)           Start     Ordered   04/07/21 1759  Full code  Continuous        04/07/21 1802           Code Status History     Date Active Date Inactive Code Status Order ID Comments User Context   03/11/2021 1722 04/06/2021 0001 Full Code [id number]  Shawna Clamp, MD ED   08/09/2020 2007 08/17/2020 0041 Full Code [id number]  Elwyn Reach, MD Inpatient   07/01/2020 1433 08/03/2020 0414 Full Code [id number]  Gleason, Otilio Carpen, PA-C ED   04/07/2020 2042 04/11/2020 1909 Full Code [id number]  Howerter, Justin B, DO ED   04/03/2019 1507 04/05/2019 2210 Full Code [id number]  Lyndee Hensen, DO ED   05/30/2017 1558 06/03/2017 0006 Full Code [id number]  Patrecia Pour, NP Inpatient   05/29/2017 1508 05/30/2017 1525 Full Code [id number]  Isla Pence, MD ED   12/24/2011 0140 12/25/2011 2040 Full Code [id number]  Truddie Hidden., MD ED       Prognosis:  < 6 months  Discharge Planning: Brenda Brenda [name] for rehab with Palliative care service follow-up  Care plan was discussed with  patient   Thank you for allowing the Palliative Medicine Team  to assist in the care of this patient.   Time In:  11 Time Out: 11.25 Total Time 25 Prolonged Time Billed  no       Greater than 50%  of this time was spent counseling and coordinating care related to the above assessment and plan.  Loistine Chance, MD  Please contact Palliative Medicine Team phone  at [phone] for questions and concerns.

## 2021-04-14 NOTE — Progress Notes (Signed)
Physical Therapy Treatment Patient Details Name: Brenda [name] MRN: [id number] DOB: 1960-05-08 Today's Date: 04/14/2021   History of Present Illness Patient is 61 y.o. female with stage IV lung cancer brought back to [facility] by family due to inability to care for patient. Patient was recently admitted hospital for lung cancer with brain metastasis and had undergone radiation treatment and was discharged home on steroids.  PMH significant for depression, breast cancer, stage IV lung cancer with brain mets and recurrent falls.    PT Comments    Pt performed sit to stand transfers x3 requiring MOD-MAX A+2 for power up and to maintain standing balance. Pt was able to stand for ~20-25s each trial with +2 assistance and X1 L knee buckle in standing requiring Max A+2 to maintain standing balance and assist to sitting EOB. Pt continues to demonstrate poor motor planning and L LE/UE deficits requiring cues for sequencing and +2 assist for performance and safety for all mobility. Pt will benefit from continued skilled PT to increase their independence and maximize safety with mobility.     Recommendations for follow up therapy are one component of a multi-disciplinary discharge planning process, led by the attending physician.  Recommendations may be updated based on patient status, additional functional criteria and insurance authorization.  Follow Up Recommendations  Skilled nursing-short term rehab (<3 hours/day)     Assistance Recommended at Discharge Frequent or constant Supervision/Assistance  Patient can return home with the following Two people to help with walking and/or transfers;Two people to help with bathing/dressing/bathroom;Assistance with feeding;Assistance with cooking/housework;Direct supervision/assist for medications management;Assist for transportation;Help with stairs or ramp for entrance;Direct supervision/assist for financial management   Equipment Recommendations  None recommended  by PT    Recommendations for Other Services       Precautions / Restrictions Precautions Precautions: Fall Precaution Comments: left hemiparesis, left neglect, inattention Restrictions Weight Bearing Restrictions: No     Mobility  Bed Mobility Overal bed mobility: Needs Assistance Bed Mobility: Supine to Sit, Sit to Supine     Supine to sit: Max assist, HOB elevated Sit to supine: Max assist, HOB elevated   General bed mobility comments: Pt with impaired motor planning and poor processing to follow cues for bed mobility. Pt required repeated verbal/tactile cues for sequencing to bring LE's off EOB. Pt able to scoot hips toward EOb with use of bed rail/R UE to assist. Max assist to raise trunk up all the way. pt with posterior LOB x2 while seated EOB requiring MAX A to maintain balance, improved to CS with use of R UE on bedrail. Pt able to shift hips laterally to repositin in bed with use of R LE bent and pushing self to side.    Transfers Overall transfer level: Needs assistance Equipment used: None Transfers: Sit to/from Stand Sit to Stand: Mod assist, Max assist, +2 physical assistance, +2 safety/equipment, From elevated surface           General transfer comment: sit to stand at EOB x 3 trials, Fluctuating MOD-MAX A+2 for balance. Pt able to stand for ~15-20s each trial. L knee block provided, L knee buckle on 2nd trial with medial collapse requiring up to MAX A+2 to maintain standing balance. Facilitation required for optimal LE placement in prep for sit to stand. L UE supported during stand due to strength deficits.    Ambulation/Gait               General Gait Details: unable to safely progress due to left  motor deficits   Stairs             Wheelchair Mobility    Modified Rankin (Stroke Patients Only)       Balance Overall balance assessment: Needs assistance Sitting-balance support: Feet supported, Single extremity supported Sitting  balance-Leahy Scale: Poor     Standing balance support: During functional activity, Reliant on assistive device for balance, Bilateral upper extremity supported Standing balance-Leahy Scale: Zero Standing balance comment: support at left hand and left knee                            Cognition Arousal/Alertness: Awake/alert Behavior During Therapy: WFL for tasks assessed/performed Overall Cognitive Status: Impaired/Different from baseline Area of Impairment: Attention, Memory, Following commands, Safety/judgement, Awareness, Problem solving                 Orientation Level: Disoriented to, Situation Current Attention Level: Sustained Memory: Decreased recall of precautions, Decreased short-term memory Following Commands: Follows one step commands inconsistently Safety/Judgement: Decreased awareness of safety, Decreased awareness of deficits   Problem Solving: Requires verbal cues, Requires tactile cues, Decreased initiation, Difficulty sequencing, Slow processing General Comments: difficulty staying focused on task, easily distracted, able to state month/year and knows she's in a hospital, moving R LE when prompted with verbal/tactile cuing to move L LE.        Exercises      General Comments        Pertinent Vitals/Pain Pain Assessment Pain Assessment: 0-10 Pain Score: 7  Pain Location: L LE Pain Descriptors / Indicators: Grimacing, Burning, Discomfort, Tender Pain Intervention(s): Limited activity within patient's tolerance, Monitored during session, Repositioned    Home Living                          Prior Function            PT Goals (current goals can now be found in the care plan section) Acute Rehab PT Goals Patient Stated Goal: none stated PT Goal Formulation: With patient Time For Goal Achievement: 04/28/21 Potential to Achieve Goals: Fair Progress towards PT goals: Progressing toward goals    Frequency    Min  2X/week      PT Plan Current plan remains appropriate    Co-evaluation              AM-PAC PT "6 Clicks" Mobility   Outcome Measure  Help needed turning from your back to your side while in a flat bed without using bedrails?: A Lot Help needed moving from lying on your back to sitting on the side of a flat bed without using bedrails?: A Lot Help needed moving to and from a bed to a chair (including a wheelchair)?: Total Help needed standing up from a chair using your arms (e.g., wheelchair or bedside chair)?: Total Help needed to walk in hospital room?: Total Help needed climbing 3-5 steps with a railing? : Total 6 Click Score: 8    End of Session Equipment Utilized During Treatment: Gait belt Activity Tolerance: Patient tolerated treatment well Patient left: in bed;with call bell/phone within reach;with bed alarm set (positioned with pillows for neutral hip) Nurse Communication: Mobility status PT Visit Diagnosis: Difficulty in walking, not elsewhere classified (R26.2);History of falling (Z91.81);Unsteadiness on feet (R26.81);Other abnormalities of gait and mobility (R26.89);Muscle weakness (generalized) (M62.81)     Time: 6546-5035 PT Time Calculation (min) (ACUTE ONLY): 27 min  Charges:  $  Therapeutic Activity: 23-37 mins                    Festus Barren PT, DPT  Acute Rehabilitation Services  Office [phone]   04/14/2021, 4:57 PM

## 2021-04-15 ENCOUNTER — Inpatient Hospital Stay: Payer: Medicaid Other | Attending: Physician Assistant

## 2021-04-15 ENCOUNTER — Inpatient Hospital Stay: Payer: Medicaid Other | Admitting: Internal Medicine

## 2021-04-15 DIAGNOSIS — F1010 Alcohol abuse, uncomplicated: Secondary | ICD-10-CM

## 2021-04-15 DIAGNOSIS — C3491 Malignant neoplasm of unspecified part of right bronchus or lung: Secondary | ICD-10-CM

## 2021-04-15 DIAGNOSIS — E43 Unspecified severe protein-calorie malnutrition: Secondary | ICD-10-CM

## 2021-04-15 NOTE — Consult Note (Signed)
HPI:  Brenda [name] is a 61 y.o. female with past medical history of stage IV lung cancer with recurrent falls who was recently discharged from the hospital after being treated for metastatic lung cancer with brain mets was brought into the hospital by her son who reported that she was aggressive, agitated, restless and acting evil and out of it at times as per the son.  She was also talking irrelevant things and looking at picture at the wall.  Patient was recently admitted hospital for lung cancer with brain metastasis and had undergone radiation treatment and was discharged home on steroids.    Patient declined interaction with this provider, despite an attempt from the nurse as a warm introduction. Patient would close her eyes and pretend to be sleep on both attempts. Will return to complete capacity evaluation tomorrow.

## 2021-04-15 NOTE — Progress Notes (Signed)
PROGRESS NOTE    Brenda [name]  ZOX:[id number] DOB: [birth year] DOA: 04/07/2021 PCP: Pcp, No   Brief Narrative:    Brenda [name] is a 61 y.o. female with past medical history of stage IV lung cancer with recurrent falls who was recently discharged from the hospital after being treated for metastatic lung cancer with brain mets was brought into the hospital by her son who reported that she was aggressive, agitated, restless and acting evil and out of it at times as per the son.  She was also talking irrelevant things and looking at picture at the wall.  Patient was recently admitted hospital for lung cancer with brain metastasis and had undergone radiation treatment and was discharged home on steroids.  At this time, patient's son wishes her to be placed as he could not take care of her at home.  During hospitalization, patient was treated with supportive care for her delirium symptoms.  Palliative care was consulted.  At this time, plan is to discharge to skilled nursing facility.  Assessment & Plan:   Acute metabolic encephalopathy with delirium: Resolved at this time.  Thought to be secondary to steroid-induced psychosis.  Urine drug screen was negative.  TSH within normal limits.  Continue thiamine and folic acid.  Transfer to skilled nursing facility when bed available.  TOC recommending psychiatry evaluation for medical decision capacity as per request of adult protective services.   History of stage IV metastatic lung cancer, brain mets with left-sided weakness: History of palliative radiation between 03/18/2021 to 04/01/2021.  Off steroids.  Continue Keppra oxycodone and gabapentin.   History of chronic combined systolic and diastolic CHF: Continue Coreg.  Compensated.  Last known ejection fraction 40 to 45%.   Stage 3b CKD: Stable at baseline, creatinine today at 2.7  Anemia of chronic disease, malignancy  Latest hemoglobin was 7.7.  Thrombocytopenia, pancytopenia:  Mild.  No  evidence of bleeding.  Platelet count of 113   Hx of polysubstance abuse: Continue thiamine.  No evidence of withdrawal.   Severe protein calorie malnutrition  Continue nutritional supplements.  History of recurrent falls/deconditioning and debility: Plan is for skilled nursing facility placement.   DVT prophylaxis: Heparin subcu  Code Status: Full code  Family Communication:  Spoke with the patient at bedside.  Disposition Plan: SNF  Consultants:  Palliative care Psychiatry  Procedures:  None  Antimicrobials:  None  Status is: Inpatient   Subjective: Today, patient was seen and examined at bedside.  Denies any nausea vomiting abdominal pain fever chills.    Objective: Vitals:   04/14/21 1837 04/14/21 2102 04/14/21 2301 04/15/21 0434  BP: 102/62 119/80 (!) 139/98 106/73  Pulse: 98 99 99 94  Resp: 18 16  16   Temp: 99.2 F (37.3 C) 98.9 F (37.2 C)  98.6 F (37 C)  TempSrc: Oral Oral  Oral  SpO2: 100% 99%  100%  Weight:    53.5 kg  Height:        Intake/Output Summary (Last 24 hours) at 04/15/2021 1130 Last data filed at 04/15/2021 1027 Gross per 24 hour  Intake 607 ml  Output 1800 ml  Net -1193 ml    Filed Weights   04/11/21 0550 04/14/21 0507 04/15/21 0434  Weight: 49.1 kg 53.3 kg 53.5 kg    Physical examination:  General:   not in obvious distress frail-appearing, thinly built HENT:   No scleral pallor or icterus noted. Oral mucosa is moist.  Chest:    Diminished breath sounds bilaterally. No  crackles or wheezes.  CVS: S1 &S2 heard. No murmur.  Regular rate and rhythm. Abdomen: Soft, nontender, nondistended.  Bowel sounds are heard.   Extremities: No cyanosis, clubbing or edema.  Peripheral pulses are palpable. Psych: Alert, awake and communicative. CNS: Left-sided weakness noted. Skin: Warm and dry.  No rashes noted.  Data Reviewed: I have personally reviewed following labs and imaging studies  CBC: Recent Labs  Lab 04/09/21 0648  04/11/21 0500 04/14/21 0506  WBC 4.1 3.7* 3.2*  HGB 8.8* 8.7* 7.7*  HCT 27.5* 27.1* 23.7*  MCV 107.0* 106.7* 105.8*  PLT 122* 123* 113*    Basic Metabolic Panel: Recent Labs  Lab 04/09/21 0648 04/11/21 0500 04/14/21 0506  NA 141 139 136  K 4.2 4.9 5.0  CL 114* 112* 107  CO2 20* 21* 21*  GLUCOSE 99 106* 101*  BUN 56* 58* 51*  CREATININE 2.39* 2.56* 2.71*  CALCIUM 8.4* 8.8* 8.9  MG 2.0  --   --     GFR: Estimated Creatinine Clearance: 19.1 mL/min (A) (by C-G formula based on SCr of 2.71 mg/dL (H)). Liver Function Tests: No results for input(s): AST, ALT, ALKPHOS, BILITOT, PROT, ALBUMIN in the last 168 hours.  No results for input(s): LIPASE, AMYLASE in the last 168 hours. No results for input(s): AMMONIA in the last 168 hours. Coagulation Profile: No results for input(s): INR, PROTIME in the last 168 hours. Cardiac Enzymes: No results for input(s): CKTOTAL, CKMB, CKMBINDEX, TROPONINI in the last 168 hours. BNP (last 3 results) No results for input(s): PROBNP in the last 8760 hours. HbA1C: No results for input(s): HGBA1C in the last 72 hours. CBG: No results for input(s): GLUCAP in the last 168 hours. Lipid Profile: No results for input(s): CHOL, HDL, LDLCALC, TRIG, CHOLHDL, LDLDIRECT in the last 72 hours. Thyroid Function Tests: No results for input(s): TSH, T4TOTAL, FREET4, T3FREE, THYROIDAB in the last 72 hours. Anemia Panel: No results for input(s): VITAMINB12, FOLATE, FERRITIN, TIBC, IRON, RETICCTPCT in the last 72 hours. Sepsis Labs: No results for input(s): PROCALCITON, LATICACIDVEN in the last 168 hours.  Recent Results (from the past 240 hour(s))  Resp Panel by RT-PCR (Flu A&B, Covid) Nasopharyngeal Swab     Status: None   Collection Time: 04/08/21 12:27 AM   Specimen: Nasopharyngeal Swab; Nasopharyngeal(NP) swabs in vial transport medium  Result Value Ref Range Status   SARS Coronavirus 2 by RT PCR NEGATIVE NEGATIVE Final    Comment:  (NOTE) SARS-CoV-2 target nucleic acids are NOT DETECTED.  The SARS-CoV-2 RNA is generally detectable in upper respiratory specimens during the acute phase of infection. The lowest concentration of SARS-CoV-2 viral copies this assay can detect is 138 copies/mL. A negative result does not preclude SARS-Cov-2 infection and should not be used as the sole basis for treatment or other patient management decisions. A negative result may occur with  improper specimen collection/handling, submission of specimen other than nasopharyngeal swab, presence of viral mutation(s) within the areas targeted by this assay, and inadequate number of viral copies(<138 copies/mL). A negative result must be combined with clinical observations, patient history, and epidemiological information. The expected result is Negative.  Fact Sheet for Patients:  EntrepreneurPulse.com.au  Fact Sheet for Healthcare Providers:  IncredibleEmployment.be  This test is no t yet approved or cleared by the [place] FDA and  has been authorized for detection and/or diagnosis of SARS-CoV-2 by FDA under an Emergency Use Authorization (EUA). This EUA will remain  in effect (meaning this test can be used)  for the duration of the COVID-19 declaration under Section 564(b)(1) of the Act, 21 U.S.C.section 360bbb-3(b)(1), unless the authorization is terminated  or revoked sooner.       Influenza A by PCR NEGATIVE NEGATIVE Final   Influenza B by PCR NEGATIVE NEGATIVE Final    Comment: (NOTE) The Xpert Xpress SARS-CoV-2/FLU/RSV plus assay is intended as an aid in the diagnosis of influenza from Nasopharyngeal swab specimens and should not be used as a sole basis for treatment. Nasal washings and aspirates are unacceptable for Xpert Xpress SARS-CoV-2/FLU/RSV testing.  Fact Sheet for Patients: EntrepreneurPulse.com.au  Fact Sheet for Healthcare  Providers: IncredibleEmployment.be  This test is not yet approved or cleared by the [place] FDA and has been authorized for detection and/or diagnosis of SARS-CoV-2 by FDA under an Emergency Use Authorization (EUA). This EUA will remain in effect (meaning this test can be used) for the duration of the COVID-19 declaration under Section 564(b)(1) of the Act, 21 U.S.C. section 360bbb-3(b)(1), unless the authorization is terminated or revoked.  Performed at [facility], [place] [address] [place], [place] [address]        Radiology Studies: No results found.  Scheduled Meds:  carvedilol  6.25 mg Oral BID   Chlorhexidine Gluconate Cloth  6 each Topical Daily   feeding supplement  237 mL Oral BID BM   folic acid  1 mg Oral Daily   gabapentin  300 mg Oral QHS   heparin  5,000 Units Subcutaneous Q8H   levETIRAcetam  500 mg Oral BID   multivitamin with minerals  1 tablet Oral Daily   pantoprazole  40 mg Oral Daily   polyethylene glycol  17 g Oral BID   sodium chloride flush  10-40 mL Intracatheter Q12H   sodium chloride flush  3 mL Intravenous Q12H   thiamine  100 mg Oral Daily   Continuous Infusions:  sodium chloride       LOS: 7 days    Flora Lipps, MD [facility] If 7PM-7AM, please contact night-coverage [email or web address] 04/15/2021, 11:30 AM

## 2021-04-16 LAB — BASIC METABOLIC PANEL
Anion gap: 8 (ref 5–15)
BUN: 43 mg/dL — ABNORMAL HIGH (ref 6–20)
CO2: 22 mmol/L (ref 22–32)
Calcium: 9.4 mg/dL (ref 8.9–10.3)
Chloride: 107 mmol/L (ref 98–111)
Creatinine, Ser: 2.79 mg/dL — ABNORMAL HIGH (ref 0.44–1.00)
GFR, Estimated: 19 mL/min — ABNORMAL LOW (ref >60)
Glucose, Bld: 103 mg/dL — ABNORMAL HIGH (ref 70–99)
Potassium: 4.7 mmol/L (ref 3.5–5.1)
Sodium: 137 mmol/L (ref 135–145)

## 2021-04-16 LAB — CBC
HCT: 26.6 % — ABNORMAL LOW (ref 36.0–46.0)
Hemoglobin: 8.5 g/dL — ABNORMAL LOW (ref 12.0–15.0)
MCH: 34 pg (ref 26.0–34.0)
MCHC: 32 g/dL (ref 30.0–36.0)
MCV: 106.4 fL — ABNORMAL HIGH (ref 80.0–100.0)
Platelets: 118 10*3/uL — ABNORMAL LOW (ref 150–400)
RBC: 2.5 MIL/uL — ABNORMAL LOW (ref 3.87–5.11)
RDW: 13.4 % (ref 11.5–15.5)
WBC: 2.9 10*3/uL — ABNORMAL LOW (ref 4.0–10.5)
nRBC: 0 % (ref 0.0–0.2)

## 2021-04-16 NOTE — Progress Notes (Signed)
PROGRESS NOTE    Brenda [name]  WVP:[id number] DOB: 10-17-1960 DOA: 04/07/2021 PCP: Pcp, No   Brief Narrative:    Brenda [name] is a 61 y.o. female with past medical history of stage IV lung cancer with recurrent falls who was recently discharged from the hospital after being treated for metastatic lung cancer with brain mets was brought into the hospital by her son who reported that she was aggressive, agitated, restless and acting evil and out of it at times as per the son.  She was also talking irrelevant things and looking at picture at the wall.  Patient was recently admitted hospital for lung cancer with brain metastasis and had undergone radiation treatment and was discharged home on steroids.  At this time, patient's son wishes her to be placed as he could not take care of her at home.  During hospitalization, patient was treated with supportive care for her delirium symptoms.  Palliative care was consulted.  At this time, plan is to discharge to skilled nursing facility.  Assessment & Plan:   Acute metabolic encephalopathy with delirium: Resolved. Thought to be secondary to steroid-induced psychosis.  Urine drug screen was negative.  TSH within normal limits.  Continue thiamine and folic acid.  Psychiatry has been consulted as per [facility] request.    History of stage IV metastatic lung cancer, brain mets with left-sided weakness: History of palliative radiation between 03/18/2021 to 04/01/2021.  Off steroids.  Continue Keppra oxycodone and gabapentin.   History of chronic combined systolic and diastolic CHF: Continue Coreg.  Compensated.  Last known ejection fraction 40 to 45%.   Stage 3b CKD: Stable at baseline, creatinine today at 2.7  Anemia of chronic disease, malignancy  Latest hemoglobin was 7.7.  Thrombocytopenia, pancytopenia:  Mild.  No evidence of bleeding.  Platelet count of 118   Hx of polysubstance abuse: Continue thiamine.    Severe protein calorie malnutrition   Continue nutritional supplements.  History of recurrent falls/deconditioning and debility: Plan is for skilled nursing facility placement.  TOC on board.   DVT prophylaxis: Heparin subcu  Code Status: Full code  Family Communication:  Spoke with the patient's son at bedside  Disposition Plan: SNF  Consultants:  Palliative care Psychiatry  Procedures:  None  Antimicrobials:  None  Status is: Inpatient   Subjective: Today, patient was seen and examined at bedside denies any nausea vomiting fever chills dizziness pain.  Feels sleepy.  Objective: Vitals:   04/14/21 2301 04/15/21 0434 04/15/21 2023 04/16/21 0513  BP: (!) 139/98 106/73 120/70 132/87  Pulse: 99 94 100 (!) 101  Resp:  16 18 16   Temp:  98.6 F (37 C) 98 F (36.7 C) 98.1 F (36.7 C)  TempSrc:  Oral Oral Oral  SpO2:  100% 100% 100%  Weight:  53.5 kg  47.9 kg  Height:        Intake/Output Summary (Last 24 hours) at 04/16/2021 0951 Last data filed at 04/16/2021 0940 Gross per 24 hour  Intake 253 ml  Output 300 ml  Net -47 ml    Filed Weights   04/14/21 0507 04/15/21 0434 04/16/21 0513  Weight: 53.3 kg 53.5 kg 47.9 kg    Physical examination:  General:   not in obvious distress, frail appearing, thinly built HENT:   No scleral pallor or icterus noted. Oral mucosa is moist.  Chest:    Diminished breath sounds bilaterally. No crackles or wheezes.  CVS: S1 &S2 heard. No murmur.  Regular rate and rhythm.  Abdomen: Soft, nontender, nondistended.  Bowel sounds are heard.   Extremities: No cyanosis, clubbing or edema.  Peripheral pulses are palpable. Psych: Alert, awake and communicative. CNS: Left-sided weakness. Skin: Warm and dry.  No rashes noted.  Data Reviewed: I have personally reviewed following labs and imaging studies  CBC: Recent Labs  Lab 04/11/21 0500 04/14/21 0506 04/16/21 0534  WBC 3.7* 3.2* 2.9*  HGB 8.7* 7.7* 8.5*  HCT 27.1* 23.7* 26.6*  MCV 106.7* 105.8* 106.4*  PLT 123*  113* 118*    Basic Metabolic Panel: Recent Labs  Lab 04/11/21 0500 04/14/21 0506 04/16/21 0534  NA 139 136 137  K 4.9 5.0 4.7  CL 112* 107 107  CO2 21* 21* 22  GLUCOSE 106* 101* 103*  BUN 58* 51* 43*  CREATININE 2.56* 2.71* 2.79*  CALCIUM 8.8* 8.9 9.4    GFR: Estimated Creatinine Clearance: 16.6 mL/min (A) (by C-G formula based on SCr of 2.79 mg/dL (H)). Liver Function Tests: No results for input(s): AST, ALT, ALKPHOS, BILITOT, PROT, ALBUMIN in the last 168 hours.  No results for input(s): LIPASE, AMYLASE in the last 168 hours. No results for input(s): AMMONIA in the last 168 hours. Coagulation Profile: No results for input(s): INR, PROTIME in the last 168 hours. Cardiac Enzymes: No results for input(s): CKTOTAL, CKMB, CKMBINDEX, TROPONINI in the last 168 hours. BNP (last 3 results) No results for input(s): PROBNP in the last 8760 hours. HbA1C: No results for input(s): HGBA1C in the last 72 hours. CBG: No results for input(s): GLUCAP in the last 168 hours. Lipid Profile: No results for input(s): CHOL, HDL, LDLCALC, TRIG, CHOLHDL, LDLDIRECT in the last 72 hours. Thyroid Function Tests: No results for input(s): TSH, T4TOTAL, FREET4, T3FREE, THYROIDAB in the last 72 hours. Anemia Panel: No results for input(s): VITAMINB12, FOLATE, FERRITIN, TIBC, IRON, RETICCTPCT in the last 72 hours. Sepsis Labs: No results for input(s): PROCALCITON, LATICACIDVEN in the last 168 hours.  Recent Results (from the past 240 hour(s))  Resp Panel by RT-PCR (Flu A&B, Covid) Nasopharyngeal Swab     Status: None   Collection Time: 04/08/21 12:27 AM   Specimen: Nasopharyngeal Swab; Nasopharyngeal(NP) swabs in vial transport medium  Result Value Ref Range Status   SARS Coronavirus 2 by RT PCR NEGATIVE NEGATIVE Final    Comment: (NOTE) SARS-CoV-2 target nucleic acids are NOT DETECTED.  The SARS-CoV-2 RNA is generally detectable in upper respiratory specimens during the acute phase of  infection. The lowest concentration of SARS-CoV-2 viral copies this assay can detect is 138 copies/mL. A negative result does not preclude SARS-Cov-2 infection and should not be used as the sole basis for treatment or other patient management decisions. A negative result may occur with  improper specimen collection/handling, submission of specimen other than nasopharyngeal swab, presence of viral mutation(s) within the areas targeted by this assay, and inadequate number of viral copies(<138 copies/mL). A negative result must be combined with clinical observations, patient history, and epidemiological information. The expected result is Negative.  Fact Sheet for Patients:  EntrepreneurPulse.com.au  Fact Sheet for Healthcare Providers:  IncredibleEmployment.be  This test is no t yet approved or cleared by the [place] FDA and  has been authorized for detection and/or diagnosis of SARS-CoV-2 by FDA under an Emergency Use Authorization (EUA). This EUA will remain  in effect (meaning this test can be used) for the duration of the COVID-19 declaration under Section 564(b)(1) of the Act, 21 U.S.C.section 360bbb-3(b)(1), unless the authorization is terminated  or revoked sooner.  Influenza A by PCR NEGATIVE NEGATIVE Final   Influenza B by PCR NEGATIVE NEGATIVE Final    Comment: (NOTE) The Xpert Xpress SARS-CoV-2/FLU/RSV plus assay is intended as an aid in the diagnosis of influenza from Nasopharyngeal swab specimens and should not be used as a sole basis for treatment. Nasal washings and aspirates are unacceptable for Xpert Xpress SARS-CoV-2/FLU/RSV testing.  Fact Sheet for Patients: EntrepreneurPulse.com.au  Fact Sheet for Healthcare Providers: IncredibleEmployment.be  This test is not yet approved or cleared by the [place] FDA and has been authorized for detection and/or diagnosis of SARS-CoV-2  by FDA under an Emergency Use Authorization (EUA). This EUA will remain in effect (meaning this test can be used) for the duration of the COVID-19 declaration under Section 564(b)(1) of the Act, 21 U.S.C. section 360bbb-3(b)(1), unless the authorization is terminated or revoked.  Performed at [facility], [place] [address], [place], [place] [address]        Radiology Studies: No results found.  Scheduled Meds:  carvedilol  6.25 mg Oral BID   Chlorhexidine Gluconate Cloth  6 each Topical Daily   feeding supplement  237 mL Oral BID BM   folic acid  1 mg Oral Daily   gabapentin  300 mg Oral QHS   heparin  5,000 Units Subcutaneous Q8H   levETIRAcetam  500 mg Oral BID   multivitamin with minerals  1 tablet Oral Daily   pantoprazole  40 mg Oral Daily   polyethylene glycol  17 g Oral BID   sodium chloride flush  10-40 mL Intracatheter Q12H   sodium chloride flush  3 mL Intravenous Q12H   thiamine  100 mg Oral Daily   Continuous Infusions:  sodium chloride       LOS: 8 days    Flora Lipps, MD [facility] If 7PM-7AM, please contact night-coverage [email or web address] 04/16/2021, 9:51 AM

## 2021-04-16 NOTE — Consult Note (Signed)
Patient seen and attempted to perform capacity evaluation for third attempt.  Patient is observed to be lying in bed eyes closed with audible snoring.  Patient does appear to be somnolent, and will open eyes in response to physical stimulation however will not remain awake long enough to participate in evaluation.  The above findings have been communicated with Dr. Louanne Belton.  It is noted patient remains a full code, has been lethargic through most of the day as reported by the nurse.  -Psychiatry will sign off at this time.  Please reconsult once patient is awake and able to participate in capacity evaluation.

## 2021-04-16 NOTE — Progress Notes (Signed)
Nutrition Follow-up  DOCUMENTATION CODES:   Underweight, Severe malnutrition in context of chronic illness  INTERVENTION:   -Ensure Plus High Protein po BID, each supplement provides 350 kcal and 20 grams of protein.    -Magic cup BID with meals, each supplement provides 290 kcal and 9 grams of protein   -Multivitamin with minerals daily  NUTRITION DIAGNOSIS:   Severe Malnutrition related to chronic illness, cancer and cancer related treatments as evidenced by energy intake < or equal to 75% for > or equal to 1 month, mild fat depletion, severe muscle depletion.  Ongoing.  GOAL:   Patient will meet greater than or equal to 90% of their needs  Progressing.  MONITOR:   PO intake, Supplement acceptance, Labs, Weight trends, I & O's  ASSESSMENT:   61 y.o. female with past medical history of stage IV lung cancer with recurrent falls who was recently discharged from the hospital after being treated for metastatic lung cancer with brain mets.  Patient has been consuming 25-100% of meals over the past 3 days. Pt accepting protein supplements.  Admission weight: 108 lbs. Current weight: 105 lbs  Medications: Folic acid, Multivitamin with minerals daily, Miralax, thiamine  Labs reviewed.  Diet Order:   Diet Order             DIET SOFT Room service appropriate? Yes; Fluid consistency: Thin  Diet effective now                   EDUCATION NEEDS:   No education needs have been identified at this time  Skin:  Skin Assessment: Reviewed RN Assessment  Last BM:  1/24 -type 5  Height:   Ht Readings from Last 1 Encounters:  04/09/21 5\' 6"  (1.676 m)    Weight:   Wt Readings from Last 1 Encounters:  04/16/21 47.9 kg    BMI:  Body mass index is 17.04 kg/m.  Estimated Nutritional Needs:   Kcal:  2000-2200  Protein:  90-100g  Fluid:  2L/day  Clayton Bibles, MS, RD, LDN Inpatient Clinical Dietitian Contact information available via Amion

## 2021-04-17 ENCOUNTER — Inpatient Hospital Stay (HOSPITAL_COMMUNITY): Payer: Medicaid Other

## 2021-04-17 MED ORDER — DEXAMETHASONE SODIUM PHOSPHATE 4 MG/ML IJ SOLN
4.0000 mg | Freq: Two times a day (BID) | INTRAMUSCULAR | Status: DC
  Administered 2021-04-17 – 2021-04-19 (×5): 4 mg via INTRAVENOUS
  Filled 2021-04-17 (×5): qty 1

## 2021-04-17 MED ORDER — SODIUM CHLORIDE 0.9 % IV SOLN: INTRAVENOUS | Status: AC

## 2021-04-17 NOTE — Progress Notes (Signed)
PT Cancellation Note  Patient Details Name: Brenda [name] MRN: [id number] DOB: [birth year]   Cancelled Treatment:    Reason Eval/Treat Not Completed: Fatigue/lethargy limiting ability to participate. Pt asleep upon arrival, niece at bedside reports pt has been sleeping since she arrived and MD told her pt has been interactive today. Attempted sternal rub and loud voice, pt opens eyes momentarily then returns to closed; unable to participate in therapy at this time. Will check back for PT treatment as able.    Tori Audianna Landgren PT, DPT 04/17/21, 1:05 PM

## 2021-04-17 NOTE — Progress Notes (Signed)
Due to patient's lethargy, CT head scan was performed today which showed increased size of the metastatic lesion with some midline shift and intratumoral hemorrhage.  I then spoke with the patient's niece and updated her about it.  She wished to discuss this with the neurosurgeon.  I called neurosurgery on-call and spoke with Dr. Ronnald Ramp who stated that patient had large metastatic disease and the risk of intervention would be extremely high given her history of stage IV metastatic lung cancer ,debility deconditioning with residual left-sided weakness and declining overall condition.  Dr. Ronnald Ramp stated that he was going to discuss this with the patient's niece as well.  I have informed the patient's niece that this is a very poor prognostic finding.  She is aware of possible life-threatening condition from it.  At this time patient has been restarted on IV Decadron.  We will closely monitor.  Communicated with the nursing staff as well.

## 2021-04-17 NOTE — Progress Notes (Signed)
Daily Progress Note   Patient Name: Brenda [name]       Date: 04/18/2021 DOB: 1960/08/21  Age: 61 y.o. MRN#: [id number] Attending Physician: Flora Lipps, MD Primary Care Physician: Pcp, No Admit Date: 04/07/2021  Reason for Consultation/Follow-up: Establishing goals of care  Subjective: Chart reviewed and it is my understanding that Brenda [name] was altered earlier today with increased somnolence and lethargy.  Skin reveals increased metastatic lesion with some midline shift due to intratumoral hemorrhage.  Decadron was restarted at that point.  I saw and examined Brenda [name] this evening.  At the time of my encounter she was awake, alert, and interactive.  Her son and long-term roommate/boyfriend were at the bedside.  Discussed with her my concerns about increased lethargy earlier today and progression of disease as demonstrated on CT.  Reported to have decreasing intake and we discussed need to track this closely as this will be a large determining factor in her prognosis moving forward.  Length of Stay: 10  Current Medications: Scheduled Meds:   carvedilol  6.25 mg Oral BID   Chlorhexidine Gluconate Cloth  6 each Topical Daily   dexamethasone (DECADRON) injection  4 mg Intravenous Q12H   feeding supplement  237 mL Oral BID BM   folic acid  1 mg Oral Daily   levETIRAcetam  500 mg Oral BID   multivitamin with minerals  1 tablet Oral Daily   pantoprazole  40 mg Oral Daily   polyethylene glycol  17 g Oral BID   sodium chloride flush  10-40 mL Intracatheter Q12H   sodium chloride flush  3 mL Intravenous Q12H   thiamine  100 mg Oral Daily    Continuous Infusions:  sodium chloride     sodium chloride 75 mL/hr at 04/18/21 0309    PRN Meds: sodium chloride, acetaminophen,  albuterol, alum & mag hydroxide-simeth, bisacodyl, hydrALAZINE, sodium chloride flush, sodium chloride flush  Physical Exam         Awake alert Denies pain Regular work of breathing  S 1 S 2  Abdomen not tender No edema  Vital Signs: BP 134/76    Pulse 85    Temp 97.8 F (36.6 C) (Oral)    Resp 16    Ht 5\' 6"  (1.676 m)    Wt 47.3 kg    LMP  01/19/2011    SpO2 99%    BMI 16.83 kg/m  SpO2: SpO2: 99 % O2 Device: O2 Device: Room Air O2 Flow Rate:    Intake/output summary:  Intake/Output Summary (Last 24 hours) at 04/18/2021 6578 Last data filed at 04/18/2021 0309 Gross per 24 hour  Intake 1036.65 ml  Output 600 ml  Net 436.65 ml    LBM: Last BM Date: 04/17/21 Baseline Weight: Weight: 49.2 kg Most recent weight: Weight: 47.3 kg       Palliative Assessment/Data:      Patient Active Problem List   Diagnosis Date Noted   Brain mass 04/08/2021   Altered mental status 04/07/2021   Protein-calorie malnutrition, severe 03/12/2021   Primary malignant neoplasm of lung with metastasis to brain ([place]) 03/11/2021   Exudative pleural effusion    Sinus tachycardia    Pleural effusion    S/P thoracentesis    Hypomagnesemia    Acute on chronic combined systolic and diastolic congestive heart failure (HCC)    Bacteremia associated with intravascular line ([place]) 07/03/2020   Acute blood loss anemia    Renal failure 07/01/2020   AKI (acute kidney injury) ([place])    Metabolic acidosis    Epistaxis    Seizure-like activity (HCC)    Swelling of lower extremity 05/29/2020   Hypoalbuminemia 05/29/2020   Acute on chronic anemia 04/07/2020   Gastrointestinal hemorrhage 04/07/2020   Acute encephalopathy 04/07/2020   Pancytopenia ([place]) 04/07/2020   Polysubstance abuse ([place]) 04/07/2020   Hypokalemia 10/19/2019   Neutropenia ([place]) 10/05/2019   Malignant neoplasm of right lung ([place]) 09/14/2019   Encounter for antineoplastic chemotherapy 09/14/2019   Encounter for antineoplastic immunotherapy  09/14/2019   Goals of care, counseling/discussion 09/14/2019   Tobacco abuse counseling 09/14/2019   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  Palliative medicine consulted for goals of care discussions.  Recommendations/Plan: I spoke again with Brenda [name] regarding her situation.  She does not have much insight into the serious nature of her condition.  She would be agreeable to skilled facility if placement could be found.  Recommend keeping a close eye on her intake as this will be driving factor in her prognosis.  If she is truly not eating and drinking, then we may be quickly approaching end-of-life.  She was certainly more interactive this evening than she was noted to be this morning.  Palliative to continue to follow and progress conversation based upon her clinical course.  Code Status:    Code Status Orders  (From admission, onward)           Start     Ordered   04/07/21 1759  Full code  Continuous        04/07/21 1802           Code Status History     Date Active Date Inactive Code Status Order ID Comments User Context   03/11/2021 1722 04/06/2021 0001 Full Code [id number]  Shawna Clamp, MD ED   08/09/2020 2007 08/17/2020 0041 Full  Code [id number]  Elwyn Reach, MD Inpatient   07/01/2020 1433 08/03/2020 0414 Full Code [id number]  Gleason, Sissy Hoff ED   04/07/2020 2042 04/11/2020 1909 Full Code [id number]  Rhetta Mura, DO ED   04/03/2019 1507 04/05/2019  2210 Full Code [id number]  Lyndee Hensen, DO ED   05/30/2017 1558 06/03/2017 0006 Full Code [id number]  Patrecia Pour, NP Inpatient   05/29/2017 1508 05/30/2017 1525 Full Code [id number]  Isla Pence, MD ED   12/24/2011 0140 12/25/2011 2040 Full Code [id number]  Truddie Hidden., MD ED       Prognosis:  < 6 months  Discharge Planning: [place] for rehab with Palliative care service follow-up  Care plan was discussed with  patient   Thank you for allowing the Palliative Medicine Team to assist in the care of this patient.   Time In: 1830 Time Out: 1900 Total Time 30 Prolonged Time Billed  no    Micheline Rough, MD  Please contact Palliative Medicine Team phone at [phone] for questions and concerns.

## 2021-04-17 NOTE — Consult Note (Signed)
Reason for Consult:brain mass Referring Physician: dr. Avelina [name] is an 61 y.o. female.   HPI:  61 year old female with a known history of metastic lung cancer. Patient came from a SNF with left sided weakness. We were called about her in December and recommended palliative radiation. The patient is awake alert and oriented and following commands as well as answering questions appropriately. She denies any headaches nausea vomiting or dizziness. She has a complicated medical history of CKD, polysubstance abuse and HIV.   Past Medical History:  Diagnosis Date   Breast cancer ([place])    Depression    Mental disorder     Past Surgical History:  Procedure Laterality Date   ESOPHAGOGASTRODUODENOSCOPY (EGD) WITH PROPOFOL N/A 04/09/2020   Procedure: ESOPHAGOGASTRODUODENOSCOPY (EGD) WITH PROPOFOL;  Surgeon: Doran Stabler, MD;  Location: [facility];  Service: Gastroenterology;  Laterality: N/A;   IR IMAGING GUIDED PORT INSERTION  11/03/2019   TUBAL LIGATION     VIDEO BRONCHOSCOPY WITH ENDOBRONCHIAL NAVIGATION N/A 04/04/2019   Procedure: VIDEO BRONCHOSCOPY WITH ENDOBRONCHIAL NAVIGATION;  Surgeon: Garner Nash, DO;  Location: [facility];  Service: Thoracic;  Laterality: N/A;   VIDEO BRONCHOSCOPY WITH ENDOBRONCHIAL ULTRASOUND N/A 04/04/2019   Procedure: VIDEO BRONCHOSCOPY WITH ENDOBRONCHIAL ULTRASOUND;  Surgeon: Garner Nash, DO;  Location: [facility];  Service: Thoracic;  Laterality: N/A;    Allergies  Allergen Reactions   Aspirin Adult Low [Aspirin] Other (See Comments)    Stomach upset    Social History   Tobacco Use   Smoking status: Former    Packs/day: 0.50    Years: 30.00    Pack years: 15.00    Types: Cigarettes    Quit date: 05/27/2019    Years since quitting: 1.8   Smokeless tobacco: Never  Substance Use Topics   Alcohol use: Yes    Alcohol/week: 84.0 standard drinks    Types: 84 Cans of beer per week    Family History  Problem Relation Age of Onset   Cancer  Mother    Heart attack Father        thinks he had MI in his 14s; died from saw accident      Review of Systems  Positive ROS: as above  All other systems have been reviewed and were otherwise negative with the exception of those mentioned in the HPI and as above.  Objective: Vital signs in last 24 hours: Temp:  [97.7 F (36.5 C)-99.2 F (37.3 C)] 97.7 F (36.5 C) (01/26 0512) Pulse Rate:  [109-110] 110 (01/26 0512) Resp:  [16] 16 (01/26 0512) BP: (103-107)/(72-74) 107/74 (01/26 0512) SpO2:  [96 %-99 %] 96 % (01/26 0512) Weight:  [46.6 kg] 46.6 kg (01/26 0540)  General Appearance: Alert, cooperative, no distress, appears stated age Head: Normocephalic, without obvious abnormality, atraumatic Eyes: PERRL, conjunctiva/corneas clear, EOM's intact, fundi benign, both eyes      Lungs: respirations unlabored Heart: Regular rate and rhythm Extremities: Extremities normal, atraumatic, no cyanosis or edema Pulses: 2+ and symmetric all extremities Skin: Skin color, texture, turgor normal, no rashes or lesions  NEUROLOGIC:   Mental status: A&O x4, no aphasia, good attention span, Memory and fund of knowledge Motor Exam - Left hemiplegic  Sensory Exam - decreased sensation left side Reflexes: symmetric, no pathologic reflexes, No Hoffman's, No clonus Coordination - not tested Gait -not tested Balance - not tested Cranial Nerves: I: smell Not tested  II: visual acuity  OS: na    OD: na  II: visual fields Full to confrontation  II: pupils Equal, round, reactive to light  III,VII: ptosis None  III,IV,VI: extraocular muscles  Full ROM  V: mastication Normal  V: facial light touch sensation  Normal  V,VII: corneal reflex  Present  VII: facial muscle function - upper  Normal  VII: facial muscle function - lower Normal  VIII: hearing Not tested  IX: soft palate elevation  Normal  IX,X: gag reflex Present  XI: trapezius strength  5/5  XI: sternocleidomastoid strength 5/5  XI:  neck flexion strength  5/5  XII: tongue strength  Normal    Data Review Lab Results  Component Value Date   WBC 2.9 (L) 04/16/2021   HGB 8.5 (L) 04/16/2021   HCT 26.6 (L) 04/16/2021   MCV 106.4 (H) 04/16/2021   PLT 118 (L) 04/16/2021   Lab Results  Component Value Date   NA 137 04/16/2021   K 4.7 04/16/2021   CL 107 04/16/2021   CO2 22 04/16/2021   BUN 43 (H) 04/16/2021   CREATININE 2.79 (H) 04/16/2021   GLUCOSE 103 (H) 04/16/2021   Lab Results  Component Value Date   INR 1.3 (H) 07/02/2020    Radiology: No results found.   Assessment/Plan: 61 year old female came in with some left sided weakness and confusion per her son. CT head shows a large cystic metastatic lesion in the right parietal lobe that has increased in size since her mri in December. It also has acute blood products within it causing mass effect and midline shift. She also has various smaller lesions throughout her brain. I had a discussion with the patient and she states that she does not want brain surgery and "it is too late at this point." With the extensive metastasis of her cancer I would recommend palliative treatment. We will have a discussion with her son as well to make sure he agrees with the plan of care. Would also recommend getting oncology involved again as well to get their opinion on the patient's prognosis. Obviously if the patient does not have a good prognosis and they have nothing more to offer her from an oncology perspective then I think that offering this patient any type of brain surgery would be of no benefit.   Brenda [name] Brenda [name] 04/17/2021 5:04 PM

## 2021-04-17 NOTE — Progress Notes (Signed)
PROGRESS NOTE    Rosann Gorum  KGU:[id number] DOB: 1960-08-31 DOA: 04/07/2021 PCP: Pcp, No   Brief Narrative:    Carson Meche is a 61 y.o. female with past medical history of stage IV lung cancer with recurrent falls who was recently discharged from the hospital after being treated for metastatic lung cancer with brain mets was brought into the hospital by her son who reported that she was aggressive, agitated, restless and acting evil and out of it at times as per the son.  She was also talking irrelevant things and looking at picture at the wall.  Patient was recently admitted hospital for lung cancer with brain metastasis and had undergone radiation treatment and was discharged home on steroids.  At this time, patient's son wishes her to be placed as he could not take care of her at home.  During hospitalization, patient was treated with supportive care for her delirium symptoms.  Palliative care was consulted.  At this time, plan is to discharge to skilled nursing facility.  Assessment & Plan:   Acute metabolic encephalopathy with delirium: Patient has been more somnolent since yesterday and could not participate much in the discussion.  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  It is likely that there could be progression of cerebral edema causing this so we will restart dexamethasone today to see the response.  Continue thiamine and folic acid.  Psychiatry has been consulted but unable to do capacity evaluation.  We will repeat her CT scan of the head today.  History of stage IV metastatic lung cancer, brain mets with left-sided weakness: History of palliative radiation between 03/18/2021 to 04/01/2021.  We will restart IV dexamethasone today to see the response.  Continue Keppra, will hold off with oxycodone and gabapentin due to somnolence since yesterday.Marland Kitchen   History of chronic combined systolic and diastolic CHF: Continue Coreg.  Compensated.   Last known ejection fraction 40 to 45%.   Stage 3b CKD: Stable at baseline, creatinine today at 2.7  Anemia of chronic disease, malignancy  Latest hemoglobin was 8.5.  Thrombocytopenia, pancytopenia:  Mild.  No evidence of bleeding.  Platelet count of 118   Hx of polysubstance abuse: Continue thiamine.    Severe protein calorie malnutrition  Continue nutritional supplements as able.  History of recurrent falls/deconditioning and debility: Plan is for skilled nursing facility placement.  TOC on board.  DVT prophylaxis: Heparin subcu  Code Status: Full code  Family Communication:  Spoke with the patient's son at bedside on 04/16/2021  Disposition Plan: SNF  Consultants:  Palliative care Psychiatry  Procedures:  None  Antimicrobials:  None  Status is: Inpatient   Subjective: Today, patient was seen and examined at bedside.  Patient appears to be somnolent since yesterday not participating in much discussion.  Able to verbalize some but easily drifting to sleep.   Objective: Vitals:   04/16/21 1307 04/16/21 2027 04/17/21 0512 04/17/21 0540  BP: 98/73 103/72 107/74   Pulse: 96 (!) 109 (!) 110   Resp: 14 16 16    Temp: 97.7 F (36.5 C) 99.2 F (37.3 C) 97.7 F (36.5 C)   TempSrc: Oral Oral Oral   SpO2: 99% 99% 96%   Weight:    46.6 kg  Height:        Intake/Output Summary (Last 24 hours) at 04/17/2021 0936 Last data filed at 04/17/2021 0540 Gross per 24 hour  Intake 120 ml  Output 100 ml  Net 20 ml  Filed Weights   04/15/21 0434 04/16/21 0513 04/17/21 0540  Weight: 53.5 kg 47.9 kg 46.6 kg    Physical examination:  General:   not in obvious distress, frail appearing, thinly built, somnolent HENT:   No scleral pallor or icterus noted. Oral mucosa is moist.  Chest:    Diminished breath sounds bilaterally. No crackles or wheezes.  CVS: S1 &S2 heard. No murmur.  Regular rate and rhythm. Abdomen: Soft, nontender, nondistended.  Bowel sounds are heard.    Extremities: No cyanosis, clubbing or edema.  Peripheral pulses are palpable. Psych: Somnolent, not participating much in conversation  CNS: Somnolent.  Left-sided weakness. Skin: Warm and dry.  No rashes noted.  Data Reviewed: I have personally reviewed following labs and imaging studies  CBC: Recent Labs  Lab 04/11/21 0500 04/14/21 0506 04/16/21 0534  WBC 3.7* 3.2* 2.9*  HGB 8.7* 7.7* 8.5*  HCT 27.1* 23.7* 26.6*  MCV 106.7* 105.8* 106.4*  PLT 123* 113* 118*    Basic Metabolic Panel: Recent Labs  Lab 04/11/21 0500 04/14/21 0506 04/16/21 0534  NA 139 136 137  K 4.9 5.0 4.7  CL 112* 107 107  CO2 21* 21* 22  GLUCOSE 106* 101* 103*  BUN 58* 51* 43*  CREATININE 2.56* 2.71* 2.79*  CALCIUM 8.8* 8.9 9.4    GFR: Estimated Creatinine Clearance: 16.2 mL/min (A) (by C-G formula based on SCr of 2.79 mg/dL (H)). Liver Function Tests: No results for input(s): AST, ALT, ALKPHOS, BILITOT, PROT, ALBUMIN in the last 168 hours.  No results for input(s): LIPASE, AMYLASE in the last 168 hours. No results for input(s): AMMONIA in the last 168 hours. Coagulation Profile: No results for input(s): INR, PROTIME in the last 168 hours. Cardiac Enzymes: No results for input(s): CKTOTAL, CKMB, CKMBINDEX, TROPONINI in the last 168 hours. BNP (last 3 results) No results for input(s): PROBNP in the last 8760 hours. HbA1C: No results for input(s): HGBA1C in the last 72 hours. CBG: No results for input(s): GLUCAP in the last 168 hours. Lipid Profile: No results for input(s): CHOL, HDL, LDLCALC, TRIG, CHOLHDL, LDLDIRECT in the last 72 hours. Thyroid Function Tests: No results for input(s): TSH, T4TOTAL, FREET4, T3FREE, THYROIDAB in the last 72 hours. Anemia Panel: No results for input(s): VITAMINB12, FOLATE, FERRITIN, TIBC, IRON, RETICCTPCT in the last 72 hours. Sepsis Labs: No results for input(s): PROCALCITON, LATICACIDVEN in the last 168 hours.  Recent Results (from the past 240  hour(s))  Resp Panel by RT-PCR (Flu A&B, Covid) Nasopharyngeal Swab     Status: None   Collection Time: 04/08/21 12:27 AM   Specimen: Nasopharyngeal Swab; Nasopharyngeal(NP) swabs in vial transport medium  Result Value Ref Range Status   SARS Coronavirus 2 by RT PCR NEGATIVE NEGATIVE Final    Comment: (NOTE) SARS-CoV-2 target nucleic acids are NOT DETECTED.  The SARS-CoV-2 RNA is generally detectable in upper respiratory specimens during the acute phase of infection. The lowest concentration of SARS-CoV-2 viral copies this assay can detect is 138 copies/mL. A negative result does not preclude SARS-Cov-2 infection and should not be used as the sole basis for treatment or other patient management decisions. A negative result may occur with  improper specimen collection/handling, submission of specimen other than nasopharyngeal swab, presence of viral mutation(s) within the areas targeted by this assay, and inadequate number of viral copies(<138 copies/mL). A negative result must be combined with clinical observations, patient history, and epidemiological information. The expected result is Negative.  Fact Sheet for Patients:  EntrepreneurPulse.com.au  Fact Sheet for Healthcare Providers:  IncredibleEmployment.be  This test is no t yet approved or cleared by the [place] FDA and  has been authorized for detection and/or diagnosis of SARS-CoV-2 by FDA under an Emergency Use Authorization (EUA). This EUA will remain  in effect (meaning this test can be used) for the duration of the COVID-19 declaration under Section 564(b)(1) of the Act, 21 U.S.C.section 360bbb-3(b)(1), unless the authorization is terminated  or revoked sooner.       Influenza A by PCR NEGATIVE NEGATIVE Final   Influenza B by PCR NEGATIVE NEGATIVE Final    Comment: (NOTE) The Xpert Xpress SARS-CoV-2/FLU/RSV plus assay is intended as an aid in the diagnosis of influenza from  Nasopharyngeal swab specimens and should not be used as a sole basis for treatment. Nasal washings and aspirates are unacceptable for Xpert Xpress SARS-CoV-2/FLU/RSV testing.  Fact Sheet for Patients: EntrepreneurPulse.com.au  Fact Sheet for Healthcare Providers: IncredibleEmployment.be  This test is not yet approved or cleared by the [place] FDA and has been authorized for detection and/or diagnosis of SARS-CoV-2 by FDA under an Emergency Use Authorization (EUA). This EUA will remain in effect (meaning this test can be used) for the duration of the COVID-19 declaration under Section 564(b)(1) of the Act, 21 U.S.C. section 360bbb-3(b)(1), unless the authorization is terminated or revoked.  Performed at [facility], [place] [address] [place], [place] [address]        Radiology Studies: No results found.  Scheduled Meds:  carvedilol  6.25 mg Oral BID   Chlorhexidine Gluconate Cloth  6 each Topical Daily   dexamethasone (DECADRON) injection  4 mg Intravenous Q12H   feeding supplement  237 mL Oral BID BM   folic acid  1 mg Oral Daily   heparin  5,000 Units Subcutaneous Q8H   levETIRAcetam  500 mg Oral BID   multivitamin with minerals  1 tablet Oral Daily   pantoprazole  40 mg Oral Daily   polyethylene glycol  17 g Oral BID   sodium chloride flush  10-40 mL Intracatheter Q12H   sodium chloride flush  3 mL Intravenous Q12H   thiamine  100 mg Oral Daily   Continuous Infusions:  sodium chloride       LOS: 9 days    Flora Lipps, MD [facility] If 7PM-7AM, please contact night-coverage [email or web address] 04/17/2021, 9:36 AM

## 2021-04-17 NOTE — Progress Notes (Signed)
Due to patient's lethargy, CT head scan was performed today which showed increased size of the metastatic lesion with some midline shift and intratumoral hemorrhage.  I then spoke with the patient's niece and updated her about it.  She wished to discuss this with the neurosurgeon.  I called neurosurgery on-call and spoke with Dr. Ronnald Ramp who stated that patient had large metastatic disease and the risk of intervention would be extremely high given her history is for lung cancer debility deconditioning with residual left-sided biopsy and declining overall condition.  Dr. Ronnald Ramp stated that he was going to discuss this with the patient's niece as well.  I have informed the patient's niece that this is a very poor prognostic finding.  She is aware of possible life-threatening condition from it.  At this time patient has been restarted on IV Decadron.  We will closely monitor.  Communicated with the nursing staff as well.

## 2021-04-18 NOTE — Progress Notes (Signed)
PROGRESS NOTE    Brenda [name]  VOH:[id number] DOB: 09/23/1960 DOA: 04/07/2021 PCP: Pcp, No   Brief Narrative:    Brenda [name] is a 61 y.o. female with past medical history of stage IV lung cancer with recurrent falls who was recently discharged from the hospital after being treated for metastatic lung cancer with brain mets was brought into the hospital by her son who reported that she was aggressive, agitated, restless and acting evil and out of it at times as per the son.  She was also talking irrelevant things and looking at picture at the wall.  Patient was recently admitted hospital for lung cancer with brain metastasis and had undergone radiation treatment and was discharged home on steroids.  At this time, patient's son wishes her to be placed as he could not take care of her at home.  During hospitalization, patient was treated with supportive care for her delirium symptoms.  Palliative care was consulted.    Assessment & Plan:   Acute metabolic encephalopathy with delirium: Patient had lethargic so was started on dexamethasone again.  Had a CT scan of the head done yesterday which showed progressive enlargement of metastatic disease.  Spoke with neurosurgery yesterday and patient is not a good candidate for surgical intervention and at this time her mentation has slightly improved.  Poor prognosis overall.  Patient does not wish to undergo surgical intervention.  History of stage IV metastatic lung cancer, brain mets with left-sided weakness: History of palliative radiation between 03/18/2021 to 04/01/2021.  Has been restarted on Decadron with improvement in mental status.  Continue Keppra, will oxycodone and gabapentin due to somnolence but might need to resume as necessary.  Palliative care has been consulted to discuss on goals of care.   History of chronic combined systolic and diastolic CHF: Continue Coreg.  Compensated.  Last known ejection fraction 40 to 45%.   Stage 3b CKD:  Stable at baseline, latest creatinine at 2.7.  Anemia of chronic disease, malignancy  Latest hemoglobin was 8.5.  Thrombocytopenia, pancytopenia:  Mild.  No evidence of bleeding.  Platelet count of 118   Hx of polysubstance abuse: Continue thiamine.    Severe protein calorie malnutrition  Continue nutritional supplements as able.  History of recurrent falls/deconditioning and debility: Plan is for skilled nursing facility placement.  TOC on board.  DVT prophylaxis: Heparin subcu  Code Status: Full code  Family Communication:  Spoke with the patient's niece on the phone on 04/17/2021  Disposition Plan: SNF with palliative care.  Consultants:  Palliative care Psychiatry  Procedures:  None  Antimicrobials:  None  Status is: Inpatient   Subjective: Today, patient was seen and examined at bedside.  More alert awake and communicative today.  Denies any headache, nausea, vomiting, fever or chills.    Objective: Vitals:   04/17/21 1857 04/17/21 2103 04/18/21 0615 04/18/21 0620  BP: 110/78 112/77  134/76  Pulse: 93 93  85  Resp: 15 17  16   Temp: 98.6 F (37 C) 98.1 F (36.7 C)  97.8 F (36.6 C)  TempSrc: Oral Oral  Oral  SpO2: 100% 98%  99%  Weight:   47.3 kg   Height:        Intake/Output Summary (Last 24 hours) at 04/18/2021 1256 Last data filed at 04/18/2021 1115 Gross per 24 hour  Intake 1885.75 ml  Output 720 ml  Net 1165.75 ml    Filed Weights   04/16/21 0513 04/17/21 0540 04/18/21 0615  Weight: 47.9 kg  46.6 kg 47.3 kg    Physical examination:  General: thinly built, not in obvious distress HENT:   No scleral pallor or icterus noted. Oral mucosa is moist.  Chest:  Clear breath sounds.  Diminished breath sounds bilaterally. No crackles or wheezes.  CVS: S1 &S2 heard. No murmur.  Regular rate and rhythm. Abdomen: Soft, nontender, nondistended.  Bowel sounds are heard.   Extremities: No cyanosis, clubbing or edema.  Peripheral pulses are  palpable. Psych: Alert awake and communicative. CNS: Left-sided weakness noted. Skin: Warm and dry.  No rashes noted.   Data Reviewed: I have personally reviewed following labs and imaging studies  CBC: Recent Labs  Lab 04/14/21 0506 04/16/21 0534  WBC 3.2* 2.9*  HGB 7.7* 8.5*  HCT 23.7* 26.6*  MCV 105.8* 106.4*  PLT 113* 118*    Basic Metabolic Panel: Recent Labs  Lab 04/14/21 0506 04/16/21 0534  NA 136 137  K 5.0 4.7  CL 107 107  CO2 21* 22  GLUCOSE 101* 103*  BUN 51* 43*  CREATININE 2.71* 2.79*  CALCIUM 8.9 9.4    GFR: Estimated Creatinine Clearance: 16.4 mL/min (A) (by C-G formula based on SCr of 2.79 mg/dL (H)). Liver Function Tests: No results for input(s): AST, ALT, ALKPHOS, BILITOT, PROT, ALBUMIN in the last 168 hours.  No results for input(s): LIPASE, AMYLASE in the last 168 hours. No results for input(s): AMMONIA in the last 168 hours. Coagulation Profile: No results for input(s): INR, PROTIME in the last 168 hours. Cardiac Enzymes: No results for input(s): CKTOTAL, CKMB, CKMBINDEX, TROPONINI in the last 168 hours. BNP (last 3 results) No results for input(s): PROBNP in the last 8760 hours. HbA1C: No results for input(s): HGBA1C in the last 72 hours. CBG: No results for input(s): GLUCAP in the last 168 hours. Lipid Profile: No results for input(s): CHOL, HDL, LDLCALC, TRIG, CHOLHDL, LDLDIRECT in the last 72 hours. Thyroid Function Tests: No results for input(s): TSH, T4TOTAL, FREET4, T3FREE, THYROIDAB in the last 72 hours. Anemia Panel: No results for input(s): VITAMINB12, FOLATE, FERRITIN, TIBC, IRON, RETICCTPCT in the last 72 hours. Sepsis Labs: No results for input(s): PROCALCITON, LATICACIDVEN in the last 168 hours.  No results found for this or any previous visit (from the past 240 hour(s)).      Radiology Studies: CT HEAD WO CONTRAST (5MM)  Result Date: 04/17/2021 CLINICAL DATA:  History of lung cancer with metastatic disease,  altered mental status EXAM: CT HEAD WITHOUT CONTRAST TECHNIQUE: Contiguous axial images were obtained from the base of the skull through the vertex without intravenous contrast. RADIATION DOSE REDUCTION: This exam was performed according to the departmental dose-optimization program which includes automated exposure control, adjustment of the mA and/or kV according to patient size and/or use of iterative reconstruction technique. COMPARISON:  CT and MR head 03/11/2021 FINDINGS: Brain: The cystic metastatic lesion centered in the right parietal region today measures 7.5 cm AP by 4.5 cm TV by 6.4 cm cc, previously measured 6.4 cm x 3.8 cm x 6.0 cm measured at a similar level. There is new acute blood within this metastatic lesion. There is increased mass effect with increased effacement of the right lateral ventricle and up to 8 mm leftward midline shift at the level of the foramen of Monro (increased from approximately 4 mm on the prior study The smaller cystic metastatic lesion in the right frontal lobe measures 1.4 cm AP x 2.0 cm TV by 1.9 cm cc, not substantially changed in size but with evidence  of new acute blood products. There is no significant mass effect caused by this lesion. The additional smaller metastatic lesion seen on the prior MRI are not well appreciated by CT. There is no evidence of evolved territorial infarct. 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 Vascular: No hyperdense vessel or unexpected calcification. Skull: Normal. Negative for fracture or focal lesion. Sinuses/Orbits: There is mild mucosal thickening in the ethmoid air cells. The globes and orbits are unremarkable. Other: None. IMPRESSION: 1. Large cystic metastatic lesion centered in the right parietal lobe has increased in size since 03/11/2021 with acute blood products within, with increased regional mass effect now resulting in 8 mm leftward midline shift. 2. No significant interval change in size of the additional cystic  metastatic lesion in the right frontal lobe, though this lesion also demonstrates acute blood products. 3. The additional metastatic lesion seen on the prior brain MRI are not well appreciated by CT. These results were called by telephone at the time of interpretation on 04/17/2021 at 10:56 am to provider [facility] [place] , who verbally acknowledged these results. Electronically Signed   By: Valetta Mole M.D.   On: 04/17/2021 10:56    Scheduled Meds:  carvedilol  6.25 mg Oral BID   Chlorhexidine Gluconate Cloth  6 each Topical Daily   dexamethasone (DECADRON) injection  4 mg Intravenous Q12H   feeding supplement  237 mL Oral BID BM   folic acid  1 mg Oral Daily   levETIRAcetam  500 mg Oral BID   multivitamin with minerals  1 tablet Oral Daily   pantoprazole  40 mg Oral Daily   polyethylene glycol  17 g Oral BID   sodium chloride flush  10-40 mL Intracatheter Q12H   sodium chloride flush  3 mL Intravenous Q12H   thiamine  100 mg Oral Daily   Continuous Infusions:  sodium chloride     sodium chloride 75 mL/hr at 04/18/21 1115     LOS: 10 days    Flora Lipps, MD [facility] If 7PM-7AM, please contact night-coverage [email or web address] 04/18/2021, 12:56 PM

## 2021-04-18 NOTE — Progress Notes (Addendum)
Physical Therapy Treatment Patient Details Name: Brenda [name] MRN: [id number] DOB: 03-28-1960 Today's Date: 04/18/2021   History of Present Illness Patient is 61 y.o. female with stage IV lung cancer brought back to [facility] by family due to inability to care for patient. Patient was recently admitted hospital for lung cancer with brain metastasis and had undergone radiation treatment and was discharged home on steroids.  PMH significant for depression, breast cancer, stage IV lung cancer with brain mets and recurrent falls.    PT Comments    Pt assisted with MOD-MAX A for rolling L/R for bed mobility to assist with pericare and linen change. Pt continues to demonstrate poor problem solving and initiation with mobility requiring multimodal cues for sequencing with performance and safety for all mobility. Pt oriented to self, month/yr, and hospital location, but intermittently stating that she needs to go to the bank to pick up her cash. Pt will benefit from continued skilled PT to increase their independence and maximize safety with mobility. Pt will benefit from prevalon boot for skin integrity and to reduce risk of PF contracture of L LE, RN notified. PT will continue to follow pt and update recommendations and plans based on medical prognosis/goals of care .   Recommendations for follow up therapy are one component of a multi-disciplinary discharge planning process, led by the attending physician.  Recommendations may be updated based on patient status, additional functional criteria and insurance authorization.  Follow Up Recommendations  Skilled nursing-short term rehab (<3 hours/day)     Assistance Recommended at Discharge Frequent or constant Supervision/Assistance  Patient can return home with the following Two people to help with walking and/or transfers;Two people to help with bathing/dressing/bathroom;Assistance with feeding;Assistance with cooking/housework;Direct supervision/assist for  medications management;Assist for transportation;Help with stairs or ramp for entrance;Direct supervision/assist for financial management   Equipment Recommendations  None recommended by PT    Recommendations for Other Services       Precautions / Restrictions Precautions Precautions: Fall Precaution Comments: left hemiparesis, left neglect, inattention Restrictions Weight Bearing Restrictions: No     Mobility  Bed Mobility Overal bed mobility: Needs Assistance Bed Mobility: Rolling Rolling: Mod assist, Max assist, +2 for safety/equipment         General bed mobility comments: Pt with poor initiation and problem solving when prompted for rolling R/L for assist with pericare and brief change as pt soiled upon PT entry. multimodal cuing provided for sequencing and performance of rolling R/L multiple times. Pt intermittently able to sustain sidelying with use of bedrail for support and CGA-CS. Cues for awareness of L UE during bed mobility due to L hemiparesis. Pt able to initiate scooting up to [facility] with use of R UE on bedrail overhead and +2 assist to scoot.    Transfers                        Ambulation/Gait                   Stairs             Wheelchair Mobility    Modified Rankin (Stroke Patients Only)       Balance Overall balance assessment: Needs assistance Sitting-balance support: Feet supported, Single extremity supported Sitting balance-Leahy Scale: Poor     Standing balance support: During functional activity, Reliant on assistive device for balance, Bilateral upper extremity supported Standing balance-Leahy Scale: Zero  Cognition Arousal/Alertness: Awake/alert Behavior During Therapy: WFL for tasks assessed/performed Overall Cognitive Status: Impaired/Different from baseline Area of Impairment: Attention, Memory, Following commands, Safety/judgement, Awareness, Problem solving                  Orientation Level: Disoriented to, Situation   Memory: Decreased recall of precautions, Decreased short-term memory Following Commands: Follows one step commands inconsistently Safety/Judgement: Decreased awareness of safety, Decreased awareness of deficits   Problem Solving: Requires verbal cues, Requires tactile cues, Decreased initiation, Difficulty sequencing, Slow processing General Comments: difficulty staying focused on task, easily distracted, able to state month/year and knows she's in a hospital. Pt stating "let me go over here for 5 minutes and get this money" stating that she needs to go the bank and get her bank card and that she has no money multiple times.        Exercises      General Comments        Pertinent Vitals/Pain Pain Assessment Pain Assessment: Faces Faces Pain Scale: Hurts a little bit Pain Location: L thigh Pain Descriptors / Indicators: Grimacing Pain Intervention(s): Limited activity within patient's tolerance, Monitored during session, Repositioned    Home Living                          Prior Function            PT Goals (current goals can now be found in the care plan section) Acute Rehab PT Goals Patient Stated Goal: none stated, when asked pt stating that she wants to go to the bank PT Goal Formulation: With patient Time For Goal Achievement: 04/28/21 Potential to Achieve Goals: Fair Progress towards PT goals: Progressing toward goals    Frequency    Min 2X/week      PT Plan Current plan remains appropriate    Co-evaluation              AM-PAC PT "6 Clicks" Mobility   Outcome Measure  Help needed turning from your back to your side while in a flat bed without using bedrails?: A Lot Help needed moving from lying on your back to sitting on the side of a flat bed without using bedrails?: A Lot Help needed moving to and from a bed to a chair (including a wheelchair)?: Total Help needed standing up  from a chair using your arms (e.g., wheelchair or bedside chair)?: Total Help needed to walk in hospital room?: Total Help needed climbing 3-5 steps with a railing? : Total 6 Click Score: 8    End of Session   Activity Tolerance: Patient tolerated treatment well Patient left: in bed;with call bell/phone within reach;with bed alarm set (Heels floated) Nurse Communication: Mobility status PT Visit Diagnosis: Difficulty in walking, not elsewhere classified (R26.2);History of falling (Z91.81);Unsteadiness on feet (R26.81);Other abnormalities of gait and mobility (R26.89);Muscle weakness (generalized) (M62.81)     Time: 2876-8115 PT Time Calculation (min) (ACUTE ONLY): 28 min  Charges:  $Therapeutic Activity: 23-37 mins                    Festus Barren PT, DPT  Acute Rehabilitation Services  Office [phone]  04/18/2021, 3:47 PM

## 2021-04-19 LAB — CBC
HCT: 23.7 % — ABNORMAL LOW (ref 36.0–46.0)
Hemoglobin: 7.7 g/dL — ABNORMAL LOW (ref 12.0–15.0)
MCH: 33.9 pg (ref 26.0–34.0)
MCHC: 32.5 g/dL (ref 30.0–36.0)
MCV: 104.4 fL — ABNORMAL HIGH (ref 80.0–100.0)
Platelets: 147 10*3/uL — ABNORMAL LOW (ref 150–400)
RBC: 2.27 MIL/uL — ABNORMAL LOW (ref 3.87–5.11)
RDW: 12.8 % (ref 11.5–15.5)
WBC: 4.4 10*3/uL (ref 4.0–10.5)
nRBC: 0 % (ref 0.0–0.2)

## 2021-04-19 LAB — BASIC METABOLIC PANEL
Anion gap: 9 (ref 5–15)
BUN: 42 mg/dL — ABNORMAL HIGH (ref 6–20)
CO2: 20 mmol/L — ABNORMAL LOW (ref 22–32)
Calcium: 9.6 mg/dL (ref 8.9–10.3)
Chloride: 108 mmol/L (ref 98–111)
Creatinine, Ser: 2.41 mg/dL — ABNORMAL HIGH (ref 0.44–1.00)
GFR, Estimated: 23 mL/min — ABNORMAL LOW (ref >60)
Glucose, Bld: 127 mg/dL — ABNORMAL HIGH (ref 70–99)
Potassium: 4.4 mmol/L (ref 3.5–5.1)
Sodium: 137 mmol/L (ref 135–145)

## 2021-04-19 LAB — MAGNESIUM: Magnesium: 2.1 mg/dL (ref 1.7–2.4)

## 2021-04-19 MED ORDER — LORAZEPAM 0.5 MG PO TABS
0.5000 mg | ORAL_TABLET | Freq: Four times a day (QID) | ORAL | Status: DC | PRN
  Administered 2021-04-19 – 2021-05-08 (×6): 0.5 mg via ORAL
  Filled 2021-04-19 (×6): qty 1

## 2021-04-19 NOTE — Progress Notes (Signed)
Pt with confusion and agitation, attempting OOB to get some cigarettes and get to her truck. MD notified. Orders received.

## 2021-04-19 NOTE — Progress Notes (Signed)
PROGRESS NOTE    Brenda [name]  GLO:[id number] DOB: 12/22/1960 DOA: 04/07/2021 PCP: Pcp, No   Brief Narrative:    Brenda [name] is a 61 y.o. female with past medical history of stage IV lung cancer with recurrent falls who was recently discharged from the hospital after being treated for metastatic lung cancer with brain mets was brought into the hospital by her son who reported that she was aggressive, agitated, restless and acting evil and out of it at times as per the son.  She was also talking irrelevant things and looking at picture at the wall.  Patient was recently admitted hospital for lung cancer with brain metastasis and had undergone radiation treatment and was discharged home on steroids.  At this time, patient's son wishes her to be placed as he could not take care of her at home.  During hospitalization, patient was treated with supportive care for her delirium symptoms.  Palliative care was consulted.    Assessment & Plan:   Acute metabolic encephalopathy with delirium: Patient had a CT scan of the head done which showed progressive enlargement of metastatic disease.  Neurosurgery team was consulted and spoke with the patient.  She does not wish to proceed with surgical intervention.  Poor prognosis overall.  Patient does not wish to undergo surgical intervention.  Was started back on IV steroids and has been holding okay so far.  History of stage IV metastatic lung cancer, brain mets with left-sided weakness: History of palliative radiation between 03/18/2021 to 04/01/2021.  Has been restarted on Decadron with improvement in mental status.  Continue Keppra, will oxycodone and gabapentin due to somnolence but might need to resume as necessary.  Palliative care on board for goals of care discussion.   History of chronic combined systolic and diastolic CHF: Continue Coreg.  Compensated.  Last known ejection fraction 40 to 45%.   Stage 3b CKD: Stable at baseline, latest creatinine  at 2.4  Anemia of chronic disease, malignancy  Latest hemoglobin was 7.7.  Thrombocytopenia, pancytopenia:  Mild.  No evidence of bleeding.  Latest platelet count of 147   Hx of polysubstance abuse: Continue thiamine.    Severe protein calorie malnutrition  Continue nutritional supplements as able.  History of recurrent falls/deconditioning and debility: Plan is for skilled nursing facility placement.  TOC on board.  DVT prophylaxis:  SCD  Code Status: Full code  Family Communication:  Spoke with the patient's niece on the phone on 04/17/2021  Disposition Plan: SNF with palliative care.  Consultants:  Palliative care Psychiatry  Procedures:  None  Antimicrobials:  None  Status is: Inpatient   Subjective: Today, patient was seen and examined at bedside.  Patient denies any nausea vomiting headache   Objective: Vitals:   04/18/21 0620 04/18/21 1430 04/18/21 2047 04/19/21 0422  BP: 134/76 123/87 139/80 138/85  Pulse: 85 89 90 80  Resp: 16 16 18 18   Temp: 97.8 F (36.6 C) 98.1 F (36.7 C) 97.6 F (36.4 C) (!) 97.5 F (36.4 C)  TempSrc: Oral Axillary Oral Oral  SpO2: 99%  100% 100%  Weight:    47.2 kg  Height:        Intake/Output Summary (Last 24 hours) at 04/19/2021 1439 Last data filed at 04/19/2021 1145 Gross per 24 hour  Intake 243 ml  Output 250 ml  Net -7 ml    Filed Weights   04/17/21 0540 04/18/21 0615 04/19/21 0422  Weight: 46.6 kg 47.3 kg 47.2 kg  Physical examination:  General: Thinly built, not in obvious distress HENT:   No scleral pallor or icterus noted. Oral mucosa is moist.  Chest:  Clear breath sounds.  Diminished breath sounds bilaterally. No crackles or wheezes.  CVS: S1 &S2 heard. No murmur.  Regular rate and rhythm. Abdomen: Soft, nontender, nondistended.  Bowel sounds are heard.   Extremities: No cyanosis, clubbing or edema.  Peripheral pulses are palpable. Psych: Alert, awake communicative. CNS: Left-sided weakness  noted. Skin: Warm and dry.  No rashes noted.  Data Reviewed: I have personally reviewed following labs and imaging studies  CBC: Recent Labs  Lab 04/14/21 0506 04/16/21 0534 04/19/21 0556  WBC 3.2* 2.9* 4.4  HGB 7.7* 8.5* 7.7*  HCT 23.7* 26.6* 23.7*  MCV 105.8* 106.4* 104.4*  PLT 113* 118* 147*    Basic Metabolic Panel: Recent Labs  Lab 04/14/21 0506 04/16/21 0534 04/19/21 0556  NA 136 137 137  K 5.0 4.7 4.4  CL 107 107 108  CO2 21* 22 20*  GLUCOSE 101* 103* 127*  BUN 51* 43* 42*  CREATININE 2.71* 2.79* 2.41*  CALCIUM 8.9 9.4 9.6  MG  --   --  2.1    GFR: Estimated Creatinine Clearance: 19 mL/min (A) (by C-G formula based on SCr of 2.41 mg/dL (H)). Liver Function Tests: No results for input(s): AST, ALT, ALKPHOS, BILITOT, PROT, ALBUMIN in the last 168 hours.  No results for input(s): LIPASE, AMYLASE in the last 168 hours. No results for input(s): AMMONIA in the last 168 hours. Coagulation Profile: No results for input(s): INR, PROTIME in the last 168 hours. Cardiac Enzymes: No results for input(s): CKTOTAL, CKMB, CKMBINDEX, TROPONINI in the last 168 hours. BNP (last 3 results) No results for input(s): PROBNP in the last 8760 hours. HbA1C: No results for input(s): HGBA1C in the last 72 hours. CBG: No results for input(s): GLUCAP in the last 168 hours. Lipid Profile: No results for input(s): CHOL, HDL, LDLCALC, TRIG, CHOLHDL, LDLDIRECT in the last 72 hours. Thyroid Function Tests: No results for input(s): TSH, T4TOTAL, FREET4, T3FREE, THYROIDAB in the last 72 hours. Anemia Panel: No results for input(s): VITAMINB12, FOLATE, FERRITIN, TIBC, IRON, RETICCTPCT in the last 72 hours. Sepsis Labs: No results for input(s): PROCALCITON, LATICACIDVEN in the last 168 hours.  No results found for this or any previous visit (from the past 240 hour(s)).      Radiology Studies: No results found.  Scheduled Meds:  carvedilol  6.25 mg Oral BID   Chlorhexidine  Gluconate Cloth  6 each Topical Daily   dexamethasone (DECADRON) injection  4 mg Intravenous Q12H   feeding supplement  237 mL Oral BID BM   folic acid  1 mg Oral Daily   levETIRAcetam  500 mg Oral BID   multivitamin with minerals  1 tablet Oral Daily   pantoprazole  40 mg Oral Daily   polyethylene glycol  17 g Oral BID   sodium chloride flush  10-40 mL Intracatheter Q12H   sodium chloride flush  3 mL Intravenous Q12H   thiamine  100 mg Oral Daily   Continuous Infusions:  sodium chloride       LOS: 11 days    Flora Lipps, MD [facility] If 7PM-7AM, please contact night-coverage [email or web address] 04/19/2021, 2:39 PM

## 2021-04-19 NOTE — Plan of Care (Signed)
  Problem: Nutrition: Goal: Adequate nutrition will be maintained Outcome: Progressing   Problem: Elimination: Goal: Will not experience complications related to bowel motility Outcome: Progressing   Problem: Pain Managment: Goal: General experience of comfort will improve Outcome: Progressing   

## 2021-04-20 NOTE — Progress Notes (Signed)
PROGRESS NOTE    Brenda [name]  RSW:[id number] DOB: [birth year] DOA: 04/07/2021 PCP: Pcp, No   Brief Narrative:    Brenda [name] is a 61 y.o. female with past medical history of stage IV lung cancer with recurrent falls who was recently discharged from the hospital after being treated for metastatic lung cancer with brain mets was brought into the hospital by her son who reported that she was aggressive, agitated, restless and acting evil and out of it at times as per the son.  She was also talking irrelevant things and looking at picture at the wall.  Patient was recently admitted hospital for lung cancer with brain metastasis and had undergone radiation treatment and was discharged home on steroids.  At this time, patient's son wishes her to be placed as he could not take care of her at home.  During hospitalization, patient was treated with supportive care for her delirium symptoms.  Palliative care was consulted.    Assessment & Plan:   Acute metabolic encephalopathy with delirium: CT head scan done during hospitalization showed progressive enlargement of metastatic disease. e.  Neurosurgery team was consulted and spoke with the patient.  She does not wish to proceed with surgical intervention.  Poor prognosis overall.   Patient was temporarily on steroids due to lethargy but this has been discontinued again due to agitation issues again  History of stage IV metastatic lung cancer, brain mets with left-sided weakness: History of palliative radiation between 03/18/2021 to 04/01/2021.   Continue Keppra, resume oxycodone and gabapentin if necessary.  Palliative care on board for goals of care discussion.   History of chronic combined systolic and diastolic CHF: Continue Coreg.  Compensated.  Last known ejection fraction 40 to 45%.   Stage 3b CKD: Stable at baseline, latest creatinine at 2.4  Anemia of chronic disease, malignancy  Latest hemoglobin was 7.7.  Thrombocytopenia, pancytopenia:   Mild.  No evidence of bleeding.  Latest platelet count of 147   Hx of polysubstance abuse: Continue thiamine.    Severe protein calorie malnutrition  Continue nutritional supplements   History of recurrent falls/deconditioning and debility: Plan is for skilled nursing facility placement.  TOC on board.  DVT prophylaxis:  SCD  Code Status: Full code  Family Communication:  Spoke with the patient's niece on the phone on 04/17/2021  Disposition Plan: SNF with palliative care.  Consultants:  Palliative care Psychiatry  Procedures:  None  Antimicrobials:  None  Status is: Inpatient   Subjective: Today, patient was seen and examined at bedside.  Patient denies any headache, nausea, vomiting.  Had little agitation yesterday and required Ativan.  Currently sleeping  Objective: Vitals:   04/19/21 0422 04/19/21 1528 04/19/21 2056 04/20/21 0544  BP: 138/85 (!) 142/84 (!) 165/92 125/79  Pulse: 80 81 92 77  Resp: 18 16 18 16   Temp: (!) 97.5 F (36.4 C) 98.3 F (36.8 C) 98.5 F (36.9 C) 97.7 F (36.5 C)  TempSrc: Oral Oral Oral Oral  SpO2: 100% 99% 99% 98%  Weight: 47.2 kg   43.4 kg  Height:        Intake/Output Summary (Last 24 hours) at 04/20/2021 0904 Last data filed at 04/20/2021 0500 Gross per 24 hour  Intake 240 ml  Output 725 ml  Net -485 ml    Filed Weights   04/18/21 0615 04/19/21 0422 04/20/21 0544  Weight: 47.3 kg 47.2 kg 43.4 kg    Physical examination:  General: Thinly built, not in obvious distress HENT:  No scleral pallor or icterus noted. Oral mucosa is moist.  Chest:   Diminished breath sounds bilaterally.  CVS: S1 &S2 heard. No murmur.  Regular rate and rhythm. Abdomen: Soft, nontender, nondistended.  Bowel sounds are heard.   Extremities: No cyanosis, clubbing or edema.  Peripheral pulses are palpable. Psych: Alert, awake and communicative, normal mood CNS:  Left-sided weakness noted Skin: Warm and dry.  No rashes noted.   Data  Reviewed: I have personally reviewed following labs and imaging studies  CBC: Recent Labs  Lab 04/14/21 0506 04/16/21 0534 04/19/21 0556  WBC 3.2* 2.9* 4.4  HGB 7.7* 8.5* 7.7*  HCT 23.7* 26.6* 23.7*  MCV 105.8* 106.4* 104.4*  PLT 113* 118* 147*    Basic Metabolic Panel: Recent Labs  Lab 04/14/21 0506 04/16/21 0534 04/19/21 0556  NA 136 137 137  K 5.0 4.7 4.4  CL 107 107 108  CO2 21* 22 20*  GLUCOSE 101* 103* 127*  BUN 51* 43* 42*  CREATININE 2.71* 2.79* 2.41*  CALCIUM 8.9 9.4 9.6  MG  --   --  2.1    GFR: Estimated Creatinine Clearance: 17.4 mL/min (A) (by C-G formula based on SCr of 2.41 mg/dL (H)). Liver Function Tests: No results for input(s): AST, ALT, ALKPHOS, BILITOT, PROT, ALBUMIN in the last 168 hours.  No results for input(s): LIPASE, AMYLASE in the last 168 hours. No results for input(s): AMMONIA in the last 168 hours. Coagulation Profile: No results for input(s): INR, PROTIME in the last 168 hours. Cardiac Enzymes: No results for input(s): CKTOTAL, CKMB, CKMBINDEX, TROPONINI in the last 168 hours. BNP (last 3 results) No results for input(s): PROBNP in the last 8760 hours. HbA1C: No results for input(s): HGBA1C in the last 72 hours. CBG: No results for input(s): GLUCAP in the last 168 hours. Lipid Profile: No results for input(s): CHOL, HDL, LDLCALC, TRIG, CHOLHDL, LDLDIRECT in the last 72 hours. Thyroid Function Tests: No results for input(s): TSH, T4TOTAL, FREET4, T3FREE, THYROIDAB in the last 72 hours. Anemia Panel: No results for input(s): VITAMINB12, FOLATE, FERRITIN, TIBC, IRON, RETICCTPCT in the last 72 hours. Sepsis Labs: No results for input(s): PROCALCITON, LATICACIDVEN in the last 168 hours.  No results found for this or any previous visit (from the past 240 hour(s)).      Radiology Studies: No results found.  Scheduled Meds:  carvedilol  6.25 mg Oral BID   Chlorhexidine Gluconate Cloth  6 each Topical Daily   feeding  supplement  237 mL Oral BID BM   folic acid  1 mg Oral Daily   levETIRAcetam  500 mg Oral BID   multivitamin with minerals  1 tablet Oral Daily   pantoprazole  40 mg Oral Daily   polyethylene glycol  17 g Oral BID   sodium chloride flush  10-40 mL Intracatheter Q12H   sodium chloride flush  3 mL Intravenous Q12H   thiamine  100 mg Oral Daily   Continuous Infusions:  sodium chloride       LOS: 12 days    Flora Lipps, MD [facility] If 7PM-7AM, please contact night-coverage [email or web address] 04/20/2021, 9:04 AM

## 2021-04-21 ENCOUNTER — Ambulatory Visit
Admit: 2021-04-21 | Discharge: 2021-04-21 | Disposition: A | Discharge status: Home / Self Care | Payer: Medicaid Other | Attending: Internal Medicine | Admitting: Internal Medicine

## 2021-04-21 DIAGNOSIS — C3491 Malignant neoplasm of unspecified part of right bronchus or lung: Secondary | ICD-10-CM

## 2021-04-21 MED ORDER — OXYCODONE HCL 5 MG/5ML PO SOLN
5.0000 mg | ORAL | Status: DC | PRN
  Administered 2021-04-22 – 2021-05-08 (×30): 5 mg via ORAL
  Filled 2021-04-21 (×30): qty 5

## 2021-04-21 NOTE — Progress Notes (Signed)
°  Radiation Oncology         [phone] ________________________________  Name: Brenda [name] MRN: [id number]  Date of Service: 04/21/2021  DOB: 31-Mar-1960  Post Treatment Telephone Note  Diagnosis:   Stage IV (T2 a, N2, M1 B) non-small cell lung cancer, adenocarcinoma, RUL with brain metastases.   Intent: Palliative  Radiation Treatment Dates: 03/18/2021 through 04/01/2021 Site Technique Total Dose (Gy) Dose per Fx (Gy) Completed Fx Beam Energies  Brain: Whole Brain Radiation Complex 30/30 3 10/10 6X    Narrative: The patient tolerated radiation therapy relatively well.   Impression/Plan: 1. Stage IV (T2 a, N2, M1 B) non-small cell lung cancer, adenocarcinoma, RUL with brain metastases. I was unable to reach the patient but left a voicemail and on the message, I  discussed that we would be happy to continue to follow her as needed, but she will also continue to follow up with Dr. Julien Nordmann in medical oncology.        Carola Rhine, PAC

## 2021-04-21 NOTE — Plan of Care (Signed)

## 2021-04-21 NOTE — Progress Notes (Signed)
PROGRESS NOTE    Brenda [name]  TML:[id number] DOB: [birth year] DOA: 04/07/2021 PCP: Pcp, No   Brief Narrative:    Brenda [name] is a 61 y.o. female with past medical history of stage IV lung cancer with recurrent falls who was recently discharged from the hospital after being treated for metastatic lung cancer with brain mets was brought into the hospital by her son who reported that she was aggressive, agitated, restless and acting evil and out of it at times as per the son.  She was also talking irrelevant things and looking at picture at the wall.  Patient was recently admitted hospital for lung cancer with brain metastasis and had undergone radiation treatment and was discharged home on steroids.  At this time, patient's son wishes her to be placed as he could not take care of her at home.  During hospitalization, patient was treated with supportive care for her delirium symptoms.  Palliative care was consulted.    Assessment & Plan:   Acute metabolic encephalopathy with delirium: CT head scan done during hospitalization showed progressive enlargement of metastatic disease.  Neurosurgery team was consulted who spoke with the patient.  She does not wish to proceed with surgical intervention and has Poor prognosis overall.  Patient gets episodes of agitation restlessness confusion with the steroids and has been again discontinued.  History of stage IV metastatic lung cancer, brain mets with left-sided weakness: History of palliative radiation between 03/18/2021 to 04/01/2021.   Continue Keppra, resume oxycodone and gabapentin if necessary.  Patient denies any pain at this time.  Palliative care on board for goals of care discussion.   History of chronic combined systolic and diastolic CHF: Continue Coreg.  Compensated.  Last known ejection fraction 40 to 45%.   Stage 3b CKD: Stable at baseline, latest creatinine at 2.4.  Check BMP in AM.  Anemia of chronic disease, malignancy  Latest  hemoglobin was 7.7.  Check CBC in AM.  Thrombocytopenia, pancytopenia:  Mild.  No evidence of bleeding.  Latest platelet count of 147   Hx of polysubstance abuse: Continue thiamine.    Severe protein calorie malnutrition  Continue nutritional supplements   History of recurrent falls/deconditioning and debility: Plan is for skilled nursing facility placement.  TOC on board.  DVT prophylaxis:  SCD  Code Status: Full code  Family Communication:  Spoke with the patient at bedside.  Spoke with the patient's niece on the phone on 04/17/2021  Disposition Plan: SNF with palliative care.  Consultants:  Palliative care Psychiatry  Procedures:  None  Antimicrobials:  None  Status is: Inpatient   Subjective: Today, patient was seen and examined at bedside.  Denies any nausea vomiting fever or chills.  No agitation reported.  Denies any headache, cough or congestion.  Objective: Vitals:   04/20/21 1032 04/20/21 1830 04/20/21 2118 04/21/21 0627  BP: 122/86 120/78 102/73 124/77  Pulse: 90 89 93 91  Resp: 16 18 18 18   Temp: 98.3 F (36.8 C) 98.2 F (36.8 C) 98.2 F (36.8 C) 98 F (36.7 C)  TempSrc: Oral Oral Oral Oral  SpO2: 97% 98% 97% 97%  Weight:      Height:        Intake/Output Summary (Last 24 hours) at 04/21/2021 1038 Last data filed at 04/21/2021 0700 Gross per 24 hour  Intake 120 ml  Output 550 ml  Net -430 ml    Filed Weights   04/18/21 0615 04/19/21 0422 04/20/21 0544  Weight: 47.3 kg 47.2 kg  43.4 kg    Physical examination: General: Alert awake and communicative, thinly built,,not in obvious distress HENT:   No scleral pallor or icterus noted. Oral mucosa is moist.  Chest:  Diminished breath sounds bilaterally.  CVS: S1 &S2 heard. No murmur.  Regular rate and rhythm. Abdomen: Soft, nontender, nondistended.  Bowel sounds are heard.   Extremities: No cyanosis, clubbing or edema.  Peripheral pulses are palpable. Psych: Normal mood. CNS:  .   Left-sided weakness noted. Skin: Warm and dry.  No rashes noted.   Data Reviewed: I have personally reviewed following labs and imaging studies  CBC: Recent Labs  Lab 04/16/21 0534 04/19/21 0556  WBC 2.9* 4.4  HGB 8.5* 7.7*  HCT 26.6* 23.7*  MCV 106.4* 104.4*  PLT 118* 147*    Basic Metabolic Panel: Recent Labs  Lab 04/16/21 0534 04/19/21 0556  NA 137 137  K 4.7 4.4  CL 107 108  CO2 22 20*  GLUCOSE 103* 127*  BUN 43* 42*  CREATININE 2.79* 2.41*  CALCIUM 9.4 9.6  MG  --  2.1    GFR: Estimated Creatinine Clearance: 17.4 mL/min (A) (by C-G formula based on SCr of 2.41 mg/dL (H)). Liver Function Tests: No results for input(s): AST, ALT, ALKPHOS, BILITOT, PROT, ALBUMIN in the last 168 hours.  No results for input(s): LIPASE, AMYLASE in the last 168 hours. No results for input(s): AMMONIA in the last 168 hours. Coagulation Profile: No results for input(s): INR, PROTIME in the last 168 hours. Cardiac Enzymes: No results for input(s): CKTOTAL, CKMB, CKMBINDEX, TROPONINI in the last 168 hours. BNP (last 3 results) No results for input(s): PROBNP in the last 8760 hours. HbA1C: No results for input(s): HGBA1C in the last 72 hours. CBG: No results for input(s): GLUCAP in the last 168 hours. Lipid Profile: No results for input(s): CHOL, HDL, LDLCALC, TRIG, CHOLHDL, LDLDIRECT in the last 72 hours. Thyroid Function Tests: No results for input(s): TSH, T4TOTAL, FREET4, T3FREE, THYROIDAB in the last 72 hours. Anemia Panel: No results for input(s): VITAMINB12, FOLATE, FERRITIN, TIBC, IRON, RETICCTPCT in the last 72 hours. Sepsis Labs: No results for input(s): PROCALCITON, LATICACIDVEN in the last 168 hours.  No results found for this or any previous visit (from the past 240 hour(s)).      Radiology Studies: No results found.  Scheduled Meds:  carvedilol  6.25 mg Oral BID   Chlorhexidine Gluconate Cloth  6 each Topical Daily   feeding supplement  237 mL Oral BID BM    folic acid  1 mg Oral Daily   levETIRAcetam  500 mg Oral BID   multivitamin with minerals  1 tablet Oral Daily   pantoprazole  40 mg Oral Daily   polyethylene glycol  17 g Oral BID   sodium chloride flush  10-40 mL Intracatheter Q12H   sodium chloride flush  3 mL Intravenous Q12H   thiamine  100 mg Oral Daily   Continuous Infusions:  sodium chloride       LOS: 13 days    Flora Lipps, MD [facility] If 7PM-7AM, please contact night-coverage [email or web address] 04/21/2021, 10:38 AM

## 2021-04-21 NOTE — TOC Progression Note (Signed)
Transition of Care [facility]) - Progression Note    Patient Details  Name: Brenda [name] MRN: [id number] Date of Birth: 19-Jul-1960  Transition of Care [facility]) CM/SW Contact  Freddrick Gladson, Marjie Skiff, RN Phone Number: 04/21/2021, 1:31 PM  Clinical Narrative:    APS CSW Marsh Dolly [phone] given update on pt situation. Marsh Dolly informed that psych came to see the pt twice and she declined to interact with them. Marsh Dolly asked if APS would still consider taking emergency guardianship of pt. Marsh Dolly to speak with her supervisor and get back with TOC.     Barriers to Discharge: ED Facility/Family Refusing to Allow Patient to Return  Readmission Risk Interventions Readmission Risk Prevention Plan 03/14/2021 07/17/2020 04/11/2020  Transportation Screening Complete Complete Complete  PCP or Specialist Appt within 3-5 Days - - Complete  HRI or [place] - Complete Complete  Social Work Consult for [place] Planning/Counseling - Complete Complete  Palliative Care Screening - Not Applicable Not Applicable  Medication Review Press photographer) Complete Complete Complete  PCP or Specialist appointment within 3-5 days of discharge Complete - -  [place] or Home Care Consult Complete - -  SW Recovery Care/Counseling Consult Complete - -  Palliative Care Screening Complete - -  [place] Patient Refused - -  Some recent data might be hidden

## 2021-04-22 LAB — CBC
HCT: 24.9 % — ABNORMAL LOW (ref 36.0–46.0)
Hemoglobin: 8.2 g/dL — ABNORMAL LOW (ref 12.0–15.0)
MCH: 34 pg (ref 26.0–34.0)
MCHC: 32.9 g/dL (ref 30.0–36.0)
MCV: 103.3 fL — ABNORMAL HIGH (ref 80.0–100.0)
Platelets: 196 10*3/uL (ref 150–400)
RBC: 2.41 MIL/uL — ABNORMAL LOW (ref 3.87–5.11)
RDW: 12.7 % (ref 11.5–15.5)
WBC: 4.3 10*3/uL (ref 4.0–10.5)
nRBC: 0 % (ref 0.0–0.2)

## 2021-04-22 LAB — BASIC METABOLIC PANEL
Anion gap: 8 (ref 5–15)
BUN: 39 mg/dL — ABNORMAL HIGH (ref 6–20)
CO2: 22 mmol/L (ref 22–32)
Calcium: 9.7 mg/dL (ref 8.9–10.3)
Chloride: 105 mmol/L (ref 98–111)
Creatinine, Ser: 2.36 mg/dL — ABNORMAL HIGH (ref 0.44–1.00)
GFR, Estimated: 23 mL/min — ABNORMAL LOW (ref >60)
Glucose, Bld: 109 mg/dL — ABNORMAL HIGH (ref 70–99)
Potassium: 4 mmol/L (ref 3.5–5.1)
Sodium: 135 mmol/L (ref 135–145)

## 2021-04-22 NOTE — Progress Notes (Signed)
Nutrition Follow-up  DOCUMENTATION CODES:   Underweight, Severe malnutrition in context of chronic illness  INTERVENTION:   -Ensure Plus High Protein po BID, each supplement provides 350 kcal and 20 grams of protein.    -Magic cup BID with meals, each supplement provides 290 kcal and 9 grams of protein   -Multivitamin with minerals daily  NUTRITION DIAGNOSIS:   Severe Malnutrition related to chronic illness, cancer and cancer related treatments as evidenced by energy intake < or equal to 75% for > or equal to 1 month, mild fat depletion, severe muscle depletion.  Ongoing.  GOAL:   Patient will meet greater than or equal to 90% of their needs  Progressing.  MONITOR:   PO intake, Supplement acceptance, Labs, Weight trends, I & O's  ASSESSMENT:   61 y.o. female with past medical history of stage IV lung cancer with recurrent falls who was recently discharged from the hospital after being treated for metastatic lung cancer with brain mets.  Patient's intakes have been declining. Last meal documented was on 1/28, 100% of breakfast. Meals  have been ordered.  Pt currently alert/oriented x 1. Palliative care following. Pt is accepting protein supplements.  Admission weight: 108 lbs. Current weight: 105 lbs.  Medications: Folic acid, Multivitamin with minerals daily, Miralax, Thiamine  Labs reviewed.  Diet Order:   Diet Order             DIET DYS 3 Room service appropriate? Yes; Fluid consistency: Thin  Diet effective now                   EDUCATION NEEDS:   No education needs have been identified at this time  Skin:  Skin Assessment: Reviewed RN Assessment  Last BM:  1/31- type 6  Height:   Ht Readings from Last 1 Encounters:  04/09/21 5\' 6"  (1.676 m)    Weight:   Wt Readings from Last 1 Encounters:  04/22/21 47.7 kg    BMI:  Body mass index is 16.97 kg/m.  Estimated Nutritional Needs:   Kcal:  2000-2200  Protein:  90-100g  Fluid:   2L/day  Clayton Bibles, MS, RD, LDN Inpatient Clinical Dietitian Contact information available via Amion

## 2021-04-22 NOTE — Progress Notes (Signed)
Daily Progress Note   Patient Name: Brenda [name]       Date: 04/22/2021 DOB: February 11, 1961  Age: 61 y.o. MRN#: [id number] Attending Physician: Flora Lipps, MD Primary Care Physician: Pcp, No Admit Date: 04/07/2021  Reason for Consultation/Follow-up: Establishing goals of care  Subjective: I saw and examined Brenda [name].  She was lying in bed in no distress.  She opened her eyes and answered a couple of simple questions, but she did not really participate in conversation.  Length of Stay: 14  Current Medications: Scheduled Meds:   carvedilol  6.25 mg Oral BID   Chlorhexidine Gluconate Cloth  6 each Topical Daily   feeding supplement  237 mL Oral BID BM   folic acid  1 mg Oral Daily   levETIRAcetam  500 mg Oral BID   multivitamin with minerals  1 tablet Oral Daily   pantoprazole  40 mg Oral Daily   polyethylene glycol  17 g Oral BID   sodium chloride flush  10-40 mL Intracatheter Q12H   sodium chloride flush  3 mL Intravenous Q12H   thiamine  100 mg Oral Daily    Continuous Infusions:  sodium chloride      PRN Meds: sodium chloride, acetaminophen, albuterol, alum & mag hydroxide-simeth, bisacodyl, hydrALAZINE, LORazepam, oxyCODONE, sodium chloride flush, sodium chloride flush  Physical Exam         Awake alert Denies pain Regular work of breathing  S 1 S 2  Abdomen not tender No edema  Vital Signs: BP 135/86 (BP Location: Right Arm)    Pulse 92    Temp 98.3 F (36.8 C) (Oral)    Resp 18    Ht 5\' 6"  (1.676 m)    Wt 43.4 kg    LMP 01/19/2011    SpO2 100%    BMI 15.44 kg/m  SpO2: SpO2: 100 % O2 Device: O2 Device: Room Air O2 Flow Rate:    Intake/output summary:  Intake/Output Summary (Last 24 hours) at 04/22/2021 0549 Last data filed at 04/21/2021 1845 Gross per 24  hour  Intake --  Output 1250 ml  Net -1250 ml    LBM: Last BM Date: 04/21/21 Baseline Weight: Weight: 49.2 kg Most recent weight: Weight: 43.4 kg       Palliative Assessment/Data:      Patient Active Problem  List   Diagnosis Date Noted   Brain mass 04/08/2021   Altered mental status 04/07/2021   Protein-calorie malnutrition, severe 03/12/2021   Primary malignant neoplasm of lung with metastasis to brain ([place]) 03/11/2021   Exudative pleural effusion    Sinus tachycardia    Pleural effusion    S/P thoracentesis    Hypomagnesemia    Acute on chronic combined systolic and diastolic congestive heart failure (HCC)    Bacteremia associated with intravascular line ([place]) 07/03/2020   Acute blood loss anemia    Renal failure 07/01/2020   AKI (acute kidney injury) ([place])    Metabolic acidosis    Epistaxis    Seizure-like activity (HCC)    Swelling of lower extremity 05/29/2020   Hypoalbuminemia 05/29/2020   Acute on chronic anemia 04/07/2020   Gastrointestinal hemorrhage 04/07/2020   Acute encephalopathy 04/07/2020   Pancytopenia ([place]) 04/07/2020   Polysubstance abuse ([place]) 04/07/2020   Hypokalemia 10/19/2019   Neutropenia ([place]) 10/05/2019   Malignant neoplasm of right lung ([place]) 09/14/2019   Encounter for antineoplastic chemotherapy 09/14/2019   Encounter for antineoplastic immunotherapy 09/14/2019   Goals of care, counseling/discussion 09/14/2019   Tobacco abuse counseling 09/14/2019   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  Palliative medicine consulted for goals of care discussions.  Recommendations/Plan: I spoke again with Brenda [name] regarding her situation.  She does not have insight into the serious nature of her condition.   Await input from APS regarding guardianship. Overall, her condition will continue to decline.  Once determine about guardianship, will need to have further discussion regarding goals of care/CODE STATUS with guardian as her death is not unexpected and it would be inappropriate to perform CPR or intubation in the event of cardiac or respiratory arrest.   Code Status:    Code Status Orders  (From admission, onward)           Start     Ordered   04/07/21 1759  Full code  Continuous        04/07/21 1802           Code Status History     Date Active Date Inactive Code Status Order ID Comments User Context   03/11/2021 1722 04/06/2021 0001 Full Code [id number]  Shawna Clamp, MD ED   08/09/2020 2007 08/17/2020 0041 Full Code [id number]  Elwyn Reach, MD Inpatient   07/01/2020 1433 08/03/2020 0414 Full Code [id number]  Gleason, Otilio Carpen, PA-C ED   04/07/2020 2042 04/11/2020 1909 Full Code [id number]  [place], Edwards, DO ED   04/03/2019 1507 04/05/2019 2210 Full Code [id number]  Lyndee Hensen, DO ED   05/30/2017 1558 06/03/2017 0006 Full Code [id number]  Patrecia Pour, NP Inpatient   05/29/2017 1508 05/30/2017 1525 Full Code [id number]  Isla Pence, MD ED   12/24/2011 0140 12/25/2011 2040 Full Code [id number]  Truddie Hidden., MD ED       Prognosis:  < 6 months  Discharge Planning: [facility] for rehab with Palliative  care service follow-up  Care plan was discussed with  patient   Thank you for allowing the Palliative Medicine Team to assist in the care of this patient.  Micheline Rough, MD  Please contact Palliative Medicine Team phone at [phone] for questions and concerns.

## 2021-04-22 NOTE — Progress Notes (Signed)
PROGRESS NOTE    Brenda [name]  WCH:[id number] DOB: [birth year] DOA: 04/07/2021 PCP: Pcp, No   Brief Narrative:    Brenda [name] is a 61 y.o. female with past medical history of stage IV lung cancer with recurrent falls who was recently discharged from the hospital after being treated for metastatic lung cancer with brain mets was brought into the hospital by her son who reported that she was aggressive, agitated, restless and acting evil and out of it at times as per the son.  She was also talking irrelevant things and looking at picture at the wall.  Patient was recently admitted hospital for lung cancer with brain metastasis and had undergone radiation treatment and was discharged home on steroids.  At this time, patient's son wishes her to be placed as he could not take care of her at home.  During hospitalization, patient was treated with supportive care for her delirium symptoms.  Palliative care was consulted.    During hospitalization, patient was also seen by neurosurgery.  Patient has refused surgical intervention for her brain metastasis.  At this time awaiting for disposition to skilled nursing facility with palliative care follow-up.  Assessment & Plan:   Acute metabolic encephalopathy with delirium: CT head scan done during hospitalization showed progressive enlargement of metastatic disease.  Neurosurgery team was consulted who spoke with the patient.  She does not wish to proceed with surgical intervention and has Poor prognosis overall.  Patient gets episodes of agitation restlessness confusion on steroids so this has been discontinued at this time.  History of stage IV metastatic lung cancer, brain mets with left-sided weakness: History of palliative radiation between 03/18/2021 to 04/01/2021.   Continue Keppra, resume oxycodone and gabapentin if necessary.  Patient denies any pain at this time.  Palliative care on board plans for skilled nursing facility with palliative care.    History of chronic combined systolic and diastolic CHF: Continue Coreg.  Compensated.  Last known ejection fraction 40 to 45%.   Stage 3b CKD: Stable at baseline, latest creatinine at 2.3  Check BMP in AM.  Anemia of chronic disease, malignancy  Latest hemoglobin was 8.2 check CBC in AM.  Thrombocytopenia, pancytopenia:  Mild.  No evidence of bleeding.  Latest platelet count of 196   Hx of polysubstance abuse: Continue thiamine.    Severe protein calorie malnutrition  Continue nutritional supplements   History of recurrent falls/deconditioning and debility: Plan is for skilled nursing facility placement.  Spoke with TOC who is on board regarding disposition.  DVT prophylaxis:  SCD  Code Status: Full code  Family Communication:  Spoke with the patient at bedside.  Spoke with the patient's niece on the phone on 04/17/2021  Disposition Plan: SNF with palliative care.  Consultants:  Palliative care Psychiatry  Procedures:  None  Antimicrobials:  None  Status is: Inpatient   Subjective: Today, patient was seen and examined at bedside.  Denies any headache, nausea, vomiting, fever or chills.  Lying in bed.     Objective: Vitals:   04/21/21 1415 04/21/21 2024 04/22/21 0456 04/22/21 1218  BP: 111/76 108/71 135/86 101/66  Pulse: 86 (!) 108 92 96  Resp: 16 16 18 18   Temp: 98.6 F (37 C) 98.2 F (36.8 C) 98.3 F (36.8 C) 98 F (36.7 C)  TempSrc: Oral Oral Oral Oral  SpO2: 99% 100% 100% 96%  Weight:   47.7 kg   Height:        Intake/Output Summary (Last 24 hours) at  04/22/2021 1227 Last data filed at 04/22/2021 1052 Gross per 24 hour  Intake 3 ml  Output 300 ml  Net -297 ml    Filed Weights   04/19/21 0422 04/20/21 0544 04/22/21 0456  Weight: 47.2 kg 43.4 kg 47.7 kg    Physical examination: General: Thinly built, alert awake and communicative, not in obvious distress HENT:   No scleral pallor or icterus noted. Oral mucosa is moist.  Chest:  Diminished  breath sounds bilaterally. No crackles or wheezes.  CVS: S1 &S2 heard. No murmur.  Regular rate and rhythm. Abdomen: Soft, nontender, nondistended.  Bowel sounds are heard.   Extremities: No cyanosis, clubbing or edema.  Peripheral pulses are palpable. Psych: Alert, awake and oriented, normal mood CNS: Left-sided weakness noted. Skin: Warm and dry.  No rashes noted.  Data Reviewed: I have personally reviewed following labs and imaging studies  CBC: Recent Labs  Lab 04/16/21 0534 04/19/21 0556 04/22/21 0511  WBC 2.9* 4.4 4.3  HGB 8.5* 7.7* 8.2*  HCT 26.6* 23.7* 24.9*  MCV 106.4* 104.4* 103.3*  PLT 118* 147* 811    Basic Metabolic Panel: Recent Labs  Lab 04/16/21 0534 04/19/21 0556 04/22/21 0511  NA 137 137 135  K 4.7 4.4 4.0  CL 107 108 105  CO2 22 20* 22  GLUCOSE 103* 127* 109*  BUN 43* 42* 39*  CREATININE 2.79* 2.41* 2.36*  CALCIUM 9.4 9.6 9.7  MG  --  2.1  --     GFR: Estimated Creatinine Clearance: 19.6 mL/min (A) (by C-G formula based on SCr of 2.36 mg/dL (H)). Liver Function Tests: No results for input(s): AST, ALT, ALKPHOS, BILITOT, PROT, ALBUMIN in the last 168 hours.  No results for input(s): LIPASE, AMYLASE in the last 168 hours. No results for input(s): AMMONIA in the last 168 hours. Coagulation Profile: No results for input(s): INR, PROTIME in the last 168 hours. Cardiac Enzymes: No results for input(s): CKTOTAL, CKMB, CKMBINDEX, TROPONINI in the last 168 hours. BNP (last 3 results) No results for input(s): PROBNP in the last 8760 hours. HbA1C: No results for input(s): HGBA1C in the last 72 hours. CBG: No results for input(s): GLUCAP in the last 168 hours. Lipid Profile: No results for input(s): CHOL, HDL, LDLCALC, TRIG, CHOLHDL, LDLDIRECT in the last 72 hours. Thyroid Function Tests: No results for input(s): TSH, T4TOTAL, FREET4, T3FREE, THYROIDAB in the last 72 hours. Anemia Panel: No results for input(s): VITAMINB12, FOLATE, FERRITIN, TIBC,  IRON, RETICCTPCT in the last 72 hours. Sepsis Labs: No results for input(s): PROCALCITON, LATICACIDVEN in the last 168 hours.  No results found for this or any previous visit (from the past 240 hour(s)).      Radiology Studies: No results found.  Scheduled Meds:  carvedilol  6.25 mg Oral BID   Chlorhexidine Gluconate Cloth  6 each Topical Daily   feeding supplement  237 mL Oral BID BM   folic acid  1 mg Oral Daily   levETIRAcetam  500 mg Oral BID   multivitamin with minerals  1 tablet Oral Daily   pantoprazole  40 mg Oral Daily   polyethylene glycol  17 g Oral BID   sodium chloride flush  10-40 mL Intracatheter Q12H   sodium chloride flush  3 mL Intravenous Q12H   thiamine  100 mg Oral Daily   Continuous Infusions:  sodium chloride       LOS: 14 days    Flora Lipps, MD [facility] If 7PM-7AM, please contact night-coverage [email or web address] 04/22/2021,  12:27 PM

## 2021-04-23 MED ORDER — CARVEDILOL 3.125 MG PO TABS
3.1250 mg | ORAL_TABLET | Freq: Two times a day (BID) | ORAL | Status: DC
  Administered 2021-04-23 – 2021-05-10 (×34): 3.125 mg via ORAL
  Filled 2021-04-23 (×35): qty 1

## 2021-04-23 NOTE — TOC Progression Note (Signed)
Transition of Care [facility]) - Progression Note    Patient Details  Name: Brenda [name] MRN: [id number] Date of Birth: 1960/11/11  Transition of Care [facility]) CM/SW Contact  Karlynn Furrow, Marjie Skiff, RN Phone Number: 04/23/2021, 12:42 PM  Clinical Narrative:    Still awaiting call back from [facility]. The one SNF that was interested in her previously has now declined to take her. No other options for disposition at this time. TOC will continue to follow.     Barriers to Discharge: ED Facility/Family Refusing to Allow Patient to Return      Readmission Risk Interventions Readmission Risk Prevention Plan 03/14/2021 07/17/2020 04/11/2020  Transportation Screening Complete Complete Complete  PCP or Specialist Appt within 3-5 Days - - Complete  HRI or [place] - Complete Complete  Social Work Consult for [place] Planning/Counseling - Complete Complete  Palliative Care Screening - Not Applicable Not Applicable  Medication Review Press photographer) Complete Complete Complete  PCP or Specialist appointment within 3-5 days of discharge Complete - -  [place] or Home Care Consult Complete - -  SW Recovery Care/Counseling Consult Complete - -  Palliative Care Screening Complete - -  [place] Patient Refused - -  Some recent data might be hidden

## 2021-04-23 NOTE — Progress Notes (Addendum)
PROGRESS NOTE    Brenda [name]  KKX:[id number] DOB: [birth year] DOA: 04/07/2021 PCP: Pcp, No    Chief Complaint  Patient presents with   Failure To Thrive    Brief Narrative:   Patient was recently admitted hospital for lung cancer with brain metastasis and had undergone radiation treatment and was discharged home on steroids.  At this time, patient's son wishes her to be placed as he could not take care of her at home.     patient is seen by neurosurgery.  Patient has refused surgical intervention for her brain metastasis. Palliative care following  at this time awaiting for disposition to skilled nursing facility with palliative care follow-up.  Apparently also been obtaining guardianship   Subjective:  Weak, in bed, denies pain Aaox3 currently   Assessment & Plan:   Principal Problem:   Altered mental status Active Problems:   Alcohol abuse   Malignant neoplasm of right lung (HCC)   Acute encephalopathy   Protein-calorie malnutrition, severe   Brain mass  Acute metabolic encephalopathy with delirium -brought into the hospital by her son who reported that she was aggressive, agitated, restless and acting evil and out of it at times as per the son.  She was also talking irrelevant things and looking at picture at the wall. -CT head scan done during hospitalization showed progressive enlargement of metastatic disease.  Neurosurgery team was consulted who spoke with the patient.  She does not wish to proceed with surgical intervention and has Poor prognosis overall.  Patient gets episodes of agitation restlessness confusion on steroids so this has been discontinued at this time. -She is also seen by psychiatry -Today she appeared calm and cooperative   History of stage IV metastatic lung cancer, brain mets with left-sided weakness: History of palliative radiation between 03/18/2021 to 04/01/2021.   Radonc  and medical oncology made aware of this admission Continue Keppra,  resume oxycodone and gabapentin if necessary.  Patient denies any pain at this time.  Palliative care on board plans for skilled nursing facility with palliative care.  Addendum Case discussed with oncology Dr. Julien Nordmann state patient has history of  noncompliant, not a candidate for any systemic therapy from medical oncology standpoint, Dr. Earlie Server recommend palliative care and hospice, currently awaiting for guardianship to continue goals of care discussion.   History of chronic combined systolic and diastolic CHF: Continue Coreg.  Compensated.  Last known ejection fraction 40 to 45%.   Stage 3b CKD: Stable at baseline, latest creatinine at 2.3  Check BMP in AM.   Anemia of chronic disease, malignancy  Latest hemoglobin was 8.2 check CBC in AM.   Thrombocytopenia, pancytopenia:  Mild.  No evidence of bleeding.  Latest platelet count of 196   Hx of polysubstance abuse: Continue thiamine.    Severe protein calorie malnutrition  Continue nutritional supplements    History of recurrent falls/deconditioning and debility: Plan is for skilled nursing facility placement.  Spoke with TOC who is on board regarding disposition.      The patients BMI is: Body mass index is 17.44 kg/m.Marland Kitchen Seen by dietician.  I agree with the assessment and plan as outlined below: Nutrition Status: Nutrition Problem: Severe Malnutrition Etiology: chronic illness, cancer and cancer related treatments Signs/Symptoms: energy intake < or equal to 75% for > or equal to 1 month, mild fat depletion, severe muscle depletion Interventions: Ensure Enlive (each supplement provides 350kcal and 20 grams of protein), MVI  .    Unresulted Labs (From admission,  onward)    None         DVT prophylaxis: Place and maintain sequential compression device Start: 04/19/21 1446   Code Status: Full Family Communication: Patient Disposition:   Status is: Inpatient  Dispo: The patient is from: Home               Anticipated d/c is to: TBD              Anticipated d/c date is: TBD, awaiting for guardianship and placement                Consultants:  Medical oncology Dr. Julien Nordmann Radiation oncology Neurosurgery Psychiatry Palliative care   Antimicrobials:    Anti-infectives (From admission, onward)    None          Objective: Vitals:   04/22/21 0456 04/22/21 1218 04/22/21 2053 04/23/21 0518  BP: 135/86 101/66 103/67 101/66  Pulse: 92 96 99 (!) 101  Resp: 18 18 17 18   Temp: 98.3 F (36.8 C) 98 F (36.7 C) 98.8 F (37.1 C) 98.6 F (37 C)  TempSrc: Oral Oral Oral Oral  SpO2: 100% 96% 99% 99%  Weight: 47.7 kg   49 kg  Height:        Intake/Output Summary (Last 24 hours) at 04/23/2021 1204 Last data filed at 04/22/2021 1842 Gross per 24 hour  Intake 120 ml  Output --  Net 120 ml   Filed Weights   04/20/21 0544 04/22/21 0456 04/23/21 0518  Weight: 43.4 kg 47.7 kg 49 kg    Examination:  General exam: weak, chronically ill, aaox3 currently Respiratory system: Clear to auscultation. Respiratory effort normal. Cardiovascular system:  RRR.  Gastrointestinal system: Abdomen is nondistended, soft and nontender.  Normal bowel sounds heard. Central nervous system: Alert and oriented. Left hemiplegia Extremities:  no edema Skin: No rashes, lesions or ulcers Psychiatry: Currently no agitation    Data Reviewed: I have personally reviewed following labs and imaging studies  CBC: Recent Labs  Lab 04/19/21 0556 04/22/21 0511  WBC 4.4 4.3  HGB 7.7* 8.2*  HCT 23.7* 24.9*  MCV 104.4* 103.3*  PLT 147* 841    Basic Metabolic Panel: Recent Labs  Lab 04/19/21 0556 04/22/21 0511  NA 137 135  K 4.4 4.0  CL 108 105  CO2 20* 22  GLUCOSE 127* 109*  BUN 42* 39*  CREATININE 2.41* 2.36*  CALCIUM 9.6 9.7  MG 2.1  --     GFR: Estimated Creatinine Clearance: 20.1 mL/min (A) (by C-G formula based on SCr of 2.36 mg/dL (H)).  Liver Function Tests: No results for input(s):  AST, ALT, ALKPHOS, BILITOT, PROT, ALBUMIN in the last 168 hours.  CBG: No results for input(s): GLUCAP in the last 168 hours.   No results found for this or any previous visit (from the past 240 hour(s)).       Radiology Studies: No results found.      Scheduled Meds:  carvedilol  3.125 mg Oral BID   Chlorhexidine Gluconate Cloth  6 each Topical Daily   feeding supplement  237 mL Oral BID BM   folic acid  1 mg Oral Daily   levETIRAcetam  500 mg Oral BID   multivitamin with minerals  1 tablet Oral Daily   pantoprazole  40 mg Oral Daily   polyethylene glycol  17 g Oral BID   sodium chloride flush  10-40 mL Intracatheter Q12H   sodium chloride flush  3 mL Intravenous Q12H   thiamine  100 mg Oral Daily   Continuous Infusions:  sodium chloride       LOS: 15 days   Greater than 50% of this time was spent in counseling, explanation of diagnosis, planning of further management, and coordination of care.   Voice Recognition Viviann Spare dictation system was used to create this note, attempts have been made to correct errors. Please contact the author with questions and/or clarifications.   Florencia Reasons, MD PhD FACP [facility]  Available via Epic secure chat 7am-7pm for nonurgent issues Please page for urgent issues To page the attending provider between 7A-7P or the covering provider during after hours 7P-7A, please log into the web site [email or web address] and access using universal [place] password for that web site. If you do not have the password, please call the hospital operator.    04/23/2021, 12:04 PM

## 2021-04-23 NOTE — Progress Notes (Signed)
Physical Therapy Discharge Patient Details Name: Brenda [name] MRN: [id number] DOB: 1960/10/20 Today's Date: 04/23/2021 Time:  -     Patient discharged from PT services secondary to medical decline - patient is unable to make functional gains with current diagnosis. Patient to Dc to SNF for LTC.  Please see latest therapy progress note for current level of functioning and progress toward goals.    Progress and discharge plan discussed with patient and/or caregiver: Patient unable to participate in discharge planning and no caregivers available  GP   [place] Pager [phone] Office [phone]   Claretha Cooper 04/23/2021, 12:44 PM

## 2021-04-24 NOTE — TOC Progression Note (Signed)
Transition of Care [facility]) - Progression Note    Patient Details  Name: Makalah Asberry MRN: [id number] Date of Birth: 1960/09/25  Transition of Care [facility]) CM/SW Contact  Joaquin Courts, RN Phone Number: 04/24/2021, 11:52 AM  Clinical Narrative:    Damaris Schooner with [facility], facility reports no LTC beds available.     Barriers to Discharge: ED Facility/Family Refusing to Allow Patient to Return  Expected Discharge Plan and Services                                                 Social Determinants of Health (SDOH) Interventions    Readmission Risk Interventions Readmission Risk Prevention Plan 03/14/2021 07/17/2020 04/11/2020  Transportation Screening Complete Complete Complete  PCP or Specialist Appt within 3-5 Days - - Complete  HRI or [place] - Complete Complete  Social Work Consult for [place] Planning/Counseling - Complete Complete  Palliative Care Screening - Not Applicable Not Applicable  Medication Review Press photographer) Complete Complete Complete  PCP or Specialist appointment within 3-5 days of discharge Complete - -  [place] or Home Care Consult Complete - -  SW Recovery Care/Counseling Consult Complete - -  Palliative Care Screening Complete - -  [place] Patient Refused - -  Some recent data might be hidden

## 2021-04-24 NOTE — Progress Notes (Signed)
PROGRESS NOTE    Brenda [name]  ZLD:[id number] DOB: Feb 25, 1961 DOA: 04/07/2021 PCP: Pcp, No    Chief Complaint  Patient presents with   Failure To Thrive    Brief Narrative:   Patient was recently admitted hospital for lung cancer with brain metastasis and had undergone radiation treatment and was discharged home on steroids.  At this time, patient's son wishes her to be placed as he could not take care of her at home.     patient is seen by neurosurgery.  Patient has refused surgical intervention for her brain metastasis. Palliative care following  at this time awaiting for disposition to skilled nursing facility with palliative care follow-up.  Apparently also been obtaining guardianship   Subjective:  Weak, in bed, denies pain Aaox3 currently  No acute interval changes, medically stable, has been awaiting for placement Son at bedside  Assessment & Plan:   Principal Problem:   Altered mental status Active Problems:   Alcohol abuse   Malignant neoplasm of right lung (HCC)   Acute encephalopathy   Protein-calorie malnutrition, severe   Brain mass  Acute metabolic encephalopathy with delirium -brought into the hospital by her son who reported that she was aggressive, agitated, restless and acting evil and out of it at times as per the son.  She was also talking irrelevant things and looking at picture at the wall. -CT head scan done during hospitalization showed progressive enlargement of metastatic disease.  Neurosurgery team was consulted who spoke with the patient.  She does not wish to proceed with surgical intervention and has Poor prognosis overall.  Patient gets episodes of agitation restlessness confusion on steroids so this has been discontinued at this time. -She is also seen by psychiatry - she appeared calm and cooperative   History of stage IV metastatic lung cancer, brain mets with left-sided weakness: History of palliative radiation between 03/18/2021 to  04/01/2021.   Radonc  and medical oncology made aware of this admission Continue Keppra, resume oxycodone and gabapentin if necessary.  Patient denies any pain at this time.  Palliative care on board plans for skilled nursing facility with palliative care.  Addendum Case discussed with oncology Dr. Julien Nordmann state patient has history of  noncompliant, not a candidate for any systemic therapy from medical oncology standpoint, Dr. Earlie Server recommend palliative care and hospice, currently awaiting for guardianship to continue goals of care discussion.   History of chronic combined systolic and diastolic CHF: Continue Coreg.  Compensated.  Last known ejection fraction 40 to 45%.   Stage 3b CKD: Stable at baseline, latest creatinine at 2.3  Check BMP in AM.   Anemia of chronic disease, malignancy  Latest hemoglobin was 8.2 check CBC in AM.   Thrombocytopenia, pancytopenia:  Mild.  No evidence of bleeding.  Latest platelet count of 196   Hx of polysubstance abuse: Continue thiamine.    Severe protein calorie malnutrition  Continue nutritional supplements    History of recurrent falls/deconditioning and debility: Plan is for skilled nursing facility placement.  Spoke with TOC who is on board regarding disposition.      The patients BMI is: Body mass index is 17.9 kg/m.Marland Kitchen Seen by dietician.  I agree with the assessment and plan as outlined below: Nutrition Status: Nutrition Problem: Severe Malnutrition Etiology: chronic illness, cancer and cancer related treatments Signs/Symptoms: energy intake < or equal to 75% for > or equal to 1 month, mild fat depletion, severe muscle depletion Interventions: Ensure Enlive (each supplement provides 350kcal and  20 grams of protein), MVI  .    Unresulted Labs (From admission, onward)    None         DVT prophylaxis: Place and maintain sequential compression device Start: 04/19/21 1446   Code Status: Full Family Communication:  Patient Disposition:   Status is: Inpatient  Dispo: The patient is from: Home              Anticipated d/c is to: TBD              Anticipated d/c date is: TBD, awaiting for guardianship and placement                Consultants:  Medical oncology Dr. Julien Nordmann Radiation oncology Neurosurgery Psychiatry Palliative care   Antimicrobials:    Anti-infectives (From admission, onward)    None          Objective: Vitals:   04/23/21 1324 04/23/21 2021 04/23/21 2120 04/24/21 0510  BP: 102/62 (!) 87/69 (!) 149/122 103/70  Pulse: 100 98 98 96  Resp: 18 18  18   Temp: 98 F (36.7 C) 98.9 F (37.2 C)  98.2 F (36.8 C)  TempSrc: Oral Oral  Oral  SpO2: 97% 100%  100%  Weight:    50.3 kg  Height:        Intake/Output Summary (Last 24 hours) at 04/24/2021 0742 Last data filed at 04/23/2021 2125 Gross per 24 hour  Intake 243 ml  Output --  Net 243 ml   Filed Weights   04/22/21 0456 04/23/21 0518 04/24/21 0510  Weight: 47.7 kg 49 kg 50.3 kg    Examination:  General exam: weak, chronically ill, aaox3 currently Respiratory system: Clear to auscultation. Respiratory effort normal. Cardiovascular system:  RRR.  Gastrointestinal system: Abdomen is nondistended, soft and nontender.  Normal bowel sounds heard. Central nervous system: Alert and oriented. Left hemiplegia Extremities:  no edema Skin: No rashes, lesions or ulcers Psychiatry: Currently no agitation    Data Reviewed: I have personally reviewed following labs and imaging studies  CBC: Recent Labs  Lab 04/19/21 0556 04/22/21 0511  WBC 4.4 4.3  HGB 7.7* 8.2*  HCT 23.7* 24.9*  MCV 104.4* 103.3*  PLT 147* 606    Basic Metabolic Panel: Recent Labs  Lab 04/19/21 0556 04/22/21 0511  NA 137 135  K 4.4 4.0  CL 108 105  CO2 20* 22  GLUCOSE 127* 109*  BUN 42* 39*  CREATININE 2.41* 2.36*  CALCIUM 9.6 9.7  MG 2.1  --     GFR: Estimated Creatinine Clearance: 20.6 mL/min (A) (by C-G formula based on SCr  of 2.36 mg/dL (H)).  Liver Function Tests: No results for input(s): AST, ALT, ALKPHOS, BILITOT, PROT, ALBUMIN in the last 168 hours.  CBG: No results for input(s): GLUCAP in the last 168 hours.   No results found for this or any previous visit (from the past 240 hour(s)).       Radiology Studies: No results found.      Scheduled Meds:  carvedilol  3.125 mg Oral BID   Chlorhexidine Gluconate Cloth  6 each Topical Daily   feeding supplement  237 mL Oral BID BM   folic acid  1 mg Oral Daily   levETIRAcetam  500 mg Oral BID   multivitamin with minerals  1 tablet Oral Daily   pantoprazole  40 mg Oral Daily   polyethylene glycol  17 g Oral BID   sodium chloride flush  10-40 mL Intracatheter Q12H  sodium chloride flush  3 mL Intravenous Q12H   thiamine  100 mg Oral Daily   Continuous Infusions:  sodium chloride       LOS: 16 days   Greater than 50% of this time was spent in counseling, explanation of diagnosis, planning of further management, and coordination of care.   Voice Recognition Viviann Spare dictation system was used to create this note, attempts have been made to correct errors. Please contact the author with questions and/or clarifications.   Florencia Reasons, MD PhD FACP [facility]  Available via Epic secure chat 7am-7pm for nonurgent issues Please page for urgent issues To page the attending provider between 7A-7P or the covering provider during after hours 7P-7A, please log into the web site [email or web address] and access using universal [place] password for that web site. If you do not have the password, please call the hospital operator.    04/24/2021, 7:42 AM

## 2021-04-25 NOTE — Progress Notes (Signed)
PROGRESS NOTE    Brenda [name]  YPP:[id number] DOB: [birth year] DOA: 04/07/2021 PCP: Pcp, No    Chief Complaint  Patient presents with   Failure To Thrive    Brief Narrative:   Patient was recently admitted hospital for lung cancer with brain metastasis and had undergone radiation treatment and was discharged home on steroids.  At this time, patient's son wishes her to be placed as he could not take care of her at home.     patient is seen by neurosurgery.  Patient has refused surgical intervention for her brain metastasis. Palliative care following  at this time awaiting for disposition to skilled nursing facility with palliative care follow-up.  Apparently also been obtaining guardianship   Subjective:  No acute interval changes , did not participate in conversation this morning ,  Son at bedside   medically stable, has been awaiting for placement   Assessment & Plan:   Principal Problem:   Altered mental status Active Problems:   Alcohol abuse   Malignant neoplasm of right lung (HCC)   Acute encephalopathy   Protein-calorie malnutrition, severe   Brain mass  Acute metabolic encephalopathy with delirium -brought into the hospital by her son who reported that she was aggressive, agitated, restless and acting evil and out of it at times as per the son.  She was also talking irrelevant things and looking at picture at the wall. -CT head scan done during hospitalization showed progressive enlargement of metastatic disease.  Neurosurgery team was consulted who spoke with the patient.  She does not wish to proceed with surgical intervention and has Poor prognosis overall.  Patient gets episodes of agitation restlessness confusion on steroids so this has been discontinued at this time. -She is also seen by psychiatry - she appeared calm and cooperative   History of stage IV metastatic lung cancer, brain mets with left-sided weakness: History of palliative radiation between  03/18/2021 to 04/01/2021.   Radonc  and medical oncology made aware of this admission Continue Keppra, resume oxycodone and gabapentin if necessary.  Patient denies any pain at this time.  Palliative care on board plans for skilled nursing facility with palliative care.  Addendum Case discussed with oncology Dr. Julien Nordmann state patient has history of  noncompliant, not a candidate for any systemic therapy from medical oncology standpoint, Dr. Earlie Server recommend palliative care and hospice, currently awaiting for guardianship to continue goals of care discussion.   History of chronic combined systolic and diastolic CHF: Continue Coreg.  Compensated.  Last known ejection fraction 40 to 45%.   Stage 3b CKD: Stable at baseline, latest creatinine at 2.3  Check BMP in AM.   Anemia of chronic disease, malignancy  Latest hemoglobin was 8.2 check CBC in AM.   Thrombocytopenia, pancytopenia:  Mild.  No evidence of bleeding.  Latest platelet count of 196   Hx of polysubstance abuse: Continue thiamine.    Severe protein calorie malnutrition  Continue nutritional supplements    History of recurrent falls/deconditioning and debility: Plan is for skilled nursing facility placement.  Spoke with TOC who is on board regarding disposition.      The patients BMI is: Body mass index is 17.9 kg/m.Marland Kitchen Seen by dietician.  I agree with the assessment and plan as outlined below: Nutrition Status: Nutrition Problem: Severe Malnutrition Etiology: chronic illness, cancer and cancer related treatments Signs/Symptoms: energy intake < or equal to 75% for > or equal to 1 month, mild fat depletion, severe muscle depletion Interventions: Ensure Enlive (  each supplement provides 350kcal and 20 grams of protein), MVI  .    Unresulted Labs (From admission, onward)    None         DVT prophylaxis: Place and maintain sequential compression device Start: 04/19/21 1446   Code Status: Full Family Communication:  Patient Disposition:   Status is: Inpatient  Dispo: The patient is from: Home              Anticipated d/c is to: TBD              Anticipated d/c date is: TBD, awaiting for guardianship and placement                Consultants:  Medical oncology Dr. Julien Nordmann Radiation oncology Neurosurgery Psychiatry Palliative care   Antimicrobials:    Anti-infectives (From admission, onward)    None          Objective: Vitals:   04/24/21 1525 04/24/21 1941 04/24/21 2102 04/25/21 0630  BP: 100/69 117/78 106/70 120/75  Pulse: (!) 105 (!) 103 98 (!) 104  Resp: 15 20  18   Temp: 98.4 F (36.9 C) 97.8 F (36.6 C)  98.3 F (36.8 C)  TempSrc: Oral Oral  Oral  SpO2: 100% 100%  100%  Weight:      Height:        Intake/Output Summary (Last 24 hours) at 04/25/2021 1527 Last data filed at 04/25/2021 1330 Gross per 24 hour  Intake 243 ml  Output 200 ml  Net 43 ml   Filed Weights   04/22/21 0456 04/23/21 0518 04/24/21 0510  Weight: 47.7 kg 49 kg 50.3 kg    Examination:  General exam: weak, chronically ill, aaox3 currently Respiratory system: Clear to auscultation. Respiratory effort normal. Cardiovascular system:  RRR.  Gastrointestinal system: Abdomen is nondistended, soft and nontender.  Normal bowel sounds heard. Central nervous system: Alert and oriented. Left hemiplegia Extremities:  no edema Skin: No rashes, lesions or ulcers Psychiatry: Currently no agitation    Data Reviewed: I have personally reviewed following labs and imaging studies  CBC: Recent Labs  Lab 04/19/21 0556 04/22/21 0511  WBC 4.4 4.3  HGB 7.7* 8.2*  HCT 23.7* 24.9*  MCV 104.4* 103.3*  PLT 147* 732    Basic Metabolic Panel: Recent Labs  Lab 04/19/21 0556 04/22/21 0511  NA 137 135  K 4.4 4.0  CL 108 105  CO2 20* 22  GLUCOSE 127* 109*  BUN 42* 39*  CREATININE 2.41* 2.36*  CALCIUM 9.6 9.7  MG 2.1  --     GFR: Estimated Creatinine Clearance: 20.6 mL/min (A) (by C-G formula based  on SCr of 2.36 mg/dL (H)).  Liver Function Tests: No results for input(s): AST, ALT, ALKPHOS, BILITOT, PROT, ALBUMIN in the last 168 hours.  CBG: No results for input(s): GLUCAP in the last 168 hours.   No results found for this or any previous visit (from the past 240 hour(s)).       Radiology Studies: No results found.      Scheduled Meds:  carvedilol  3.125 mg Oral BID   Chlorhexidine Gluconate Cloth  6 each Topical Daily   feeding supplement  237 mL Oral BID BM   folic acid  1 mg Oral Daily   levETIRAcetam  500 mg Oral BID   multivitamin with minerals  1 tablet Oral Daily   pantoprazole  40 mg Oral Daily   polyethylene glycol  17 g Oral BID   sodium chloride flush  10-40 mL Intracatheter Q12H   sodium chloride flush  3 mL Intravenous Q12H   thiamine  100 mg Oral Daily   Continuous Infusions:  sodium chloride       LOS: 17 days   Greater than 50% of this time was spent in counseling, explanation of diagnosis, planning of further management, and coordination of care.   Voice Recognition Viviann Spare dictation system was used to create this note, attempts have been made to correct errors. Please contact the author with questions and/or clarifications.   Florencia Reasons, MD PhD FACP [facility]  Available via Epic secure chat 7am-7pm for nonurgent issues Please page for urgent issues To page the attending provider between 7A-7P or the covering provider during after hours 7P-7A, please log into the web site [email or web address] and access using universal [place] password for that web site. If you do not have the password, please call the hospital operator.    04/25/2021, 3:27 PM

## 2021-04-26 NOTE — Progress Notes (Signed)
PROGRESS NOTE    Brenda [name]  HGD:[id number] DOB: [birth year] DOA: 04/07/2021 PCP: Pcp, No    Chief Complaint  Patient presents with   Failure To Thrive    Brief Narrative:   Patient was recently admitted hospital for lung cancer with brain metastasis and had undergone radiation treatment and was discharged home on steroids.  At this time, patient's son wishes her to be placed as he could not take care of her at home.     patient is seen by neurosurgery.  Patient has refused surgical intervention for her brain metastasis. Palliative care following  at this time awaiting for disposition to skilled nursing facility with palliative care follow-up.  Apparently also been obtaining guardianship   Subjective:  No acute interval changes ,  She is alert awake and very pleasant this morning  She denies pain     medically stable, has been awaiting for placement   Assessment & Plan:   Principal Problem:   Altered mental status Active Problems:   Alcohol abuse   Malignant neoplasm of right lung (HCC)   Acute encephalopathy   Protein-calorie malnutrition, severe   Brain mass  Acute metabolic encephalopathy with delirium -brought into the hospital by her son who reported that she was aggressive, agitated, restless and acting evil and out of it at times as per the son.  She was also talking irrelevant things and looking at picture at the wall. -CT head scan done during hospitalization showed progressive enlargement of metastatic disease.  Neurosurgery team was consulted who spoke with the patient.  She does not wish to proceed with surgical intervention and has Poor prognosis overall.  Patient gets episodes of agitation restlessness confusion on steroids so this has been discontinued at this time. -She is also seen by psychiatry - she appeared calm and cooperative   History of stage IV metastatic lung cancer, brain mets with left-sided weakness: History of palliative radiation  between 03/18/2021 to 04/01/2021.   Radonc  and medical oncology made aware of this admission Continue Keppra, resume oxycodone and gabapentin if necessary.  Patient denies any pain at this time.  Palliative care on board plans for skilled nursing facility with palliative care.  Addendum Case discussed with oncology Dr. Julien Nordmann state patient has history of  noncompliant, not a candidate for any systemic therapy from medical oncology standpoint, Dr. Earlie Server recommend palliative care and hospice, currently awaiting for guardianship to continue goals of care discussion.   History of chronic combined systolic and diastolic CHF: Continue Coreg.  Compensated.  Last known ejection fraction 40 to 45%.   Stage 3b CKD: Stable at baseline, latest creatinine at 2.3  Check BMP in AM.   Anemia of chronic disease, malignancy  Latest hemoglobin was 8.2 check CBC in AM.   Thrombocytopenia, pancytopenia:  Mild.  No evidence of bleeding.  Latest platelet count of 196   Hx of polysubstance abuse: Continue thiamine.    Severe protein calorie malnutrition  Continue nutritional supplements    History of recurrent falls/deconditioning and debility: Plan is for skilled nursing facility placement.  Spoke with TOC who is on board regarding disposition.      The patients BMI is: Body mass index is 16.62 kg/m.Marland Kitchen Seen by dietician.  I agree with the assessment and plan as outlined below: Nutrition Status: Nutrition Problem: Severe Malnutrition Etiology: chronic illness, cancer and cancer related treatments Signs/Symptoms: energy intake < or equal to 75% for > or equal to 1 month, mild fat depletion, severe muscle  depletion Interventions: Ensure Enlive (each supplement provides 350kcal and 20 grams of protein), MVI  .    Unresulted Labs (From admission, onward)    None         DVT prophylaxis: Place and maintain sequential compression device Start: 04/19/21 1446   Code Status: Full Family  Communication: Patient Disposition:   Status is: Inpatient  Dispo: The patient is from: Home              Anticipated d/c is to: TBD              Anticipated d/c date is: TBD, awaiting for guardianship and placement                Consultants:  Medical oncology Dr. Julien Nordmann Radiation oncology Neurosurgery Psychiatry Palliative care   Antimicrobials:    Anti-infectives (From admission, onward)    None          Objective: Vitals:   04/25/21 1815 04/25/21 2050 04/26/21 0537 04/26/21 1410  BP: 122/68 105/72 92/70 (!) 82/56  Pulse: 68 (!) 108 99 99  Resp: 18 17 17 16   Temp: 98.2 F (36.8 C) 98.4 F (36.9 C) 98.8 F (37.1 C) 98.4 F (36.9 C)  TempSrc: Oral Oral Oral Oral  SpO2: 100% 99% 98% 100%  Weight:   46.7 kg   Height:        Intake/Output Summary (Last 24 hours) at 04/26/2021 1556 Last data filed at 04/25/2021 2105 Gross per 24 hour  Intake 243 ml  Output --  Net 243 ml   Filed Weights   04/23/21 0518 04/24/21 0510 04/26/21 0537  Weight: 49 kg 50.3 kg 46.7 kg    Examination:  General exam: weak, chronically ill, aaox3 currently Respiratory system: Clear to auscultation. Respiratory effort normal. Cardiovascular system:  RRR.  Gastrointestinal system: Abdomen is nondistended, soft and nontender.  Normal bowel sounds heard. Central nervous system: Alert and oriented. Left hemiplegia Extremities:  no edema Skin: No rashes, lesions or ulcers Psychiatry: Currently no agitation    Data Reviewed: I have personally reviewed following labs and imaging studies  CBC: Recent Labs  Lab 04/22/21 0511  WBC 4.3  HGB 8.2*  HCT 24.9*  MCV 103.3*  PLT 053    Basic Metabolic Panel: Recent Labs  Lab 04/22/21 0511  NA 135  K 4.0  CL 105  CO2 22  GLUCOSE 109*  BUN 39*  CREATININE 2.36*  CALCIUM 9.7    GFR: Estimated Creatinine Clearance: 19.2 mL/min (A) (by C-G formula based on SCr of 2.36 mg/dL (H)).  Liver Function Tests: No results for  input(s): AST, ALT, ALKPHOS, BILITOT, PROT, ALBUMIN in the last 168 hours.  CBG: No results for input(s): GLUCAP in the last 168 hours.   No results found for this or any previous visit (from the past 240 hour(s)).       Radiology Studies: No results found.      Scheduled Meds:  carvedilol  3.125 mg Oral BID   Chlorhexidine Gluconate Cloth  6 each Topical Daily   feeding supplement  237 mL Oral BID BM   folic acid  1 mg Oral Daily   levETIRAcetam  500 mg Oral BID   multivitamin with minerals  1 tablet Oral Daily   pantoprazole  40 mg Oral Daily   polyethylene glycol  17 g Oral BID   sodium chloride flush  10-40 mL Intracatheter Q12H   sodium chloride flush  3 mL Intravenous Q12H   thiamine  100 mg Oral Daily   Continuous Infusions:  sodium chloride       LOS: 18 days   Greater than 50% of this time was spent in counseling, explanation of diagnosis, planning of further management, and coordination of care.   Voice Recognition Viviann Spare dictation system was used to create this note, attempts have been made to correct errors. Please contact the author with questions and/or clarifications.   Florencia Reasons, MD PhD FACP [facility]  Available via Epic secure chat 7am-7pm for nonurgent issues Please page for urgent issues To page the attending provider between 7A-7P or the covering provider during after hours 7P-7A, please log into the web site [email or web address] and access using universal [place] password for that web site. If you do not have the password, please call the hospital operator.    04/26/2021, 3:56 PM

## 2021-04-27 NOTE — Progress Notes (Addendum)
PROGRESS NOTE    Brenda [name]  EVO:[id number] DOB: 1961-01-02 DOA: 04/07/2021 PCP: Pcp, No    Chief Complaint  Patient presents with   Failure To Thrive    Brief Narrative:   Patient was recently admitted hospital for lung cancer with brain metastasis and had undergone radiation treatment and was discharged home on steroids.  At this time, patient's son wishes her to be placed as he could not take care of her at home.     patient is seen by neurosurgery.  Patient has refused surgical intervention for her brain metastasis. Palliative care following  at this time awaiting for disposition to skilled nursing facility with palliative care follow-up.  Apparently also been obtaining guardianship   Subjective:  No acute interval changes ,  She is alert awake and very pleasant this morning  She denies pain currently, but does has intermittent pain requiring as needed analgesic Son at the bedside   medically stable, has been awaiting for placement   Assessment & Plan:   Principal Problem:   Altered mental status Active Problems:   Alcohol abuse   Malignant neoplasm of right lung (HCC)   Acute encephalopathy   Protein-calorie malnutrition, severe   Brain mass  Acute metabolic encephalopathy with delirium -brought into the hospital by her son who reported that she was aggressive, agitated, restless and acting evil and out of it at times as per the son.  She was also talking irrelevant things and looking at picture at the wall. -CT head scan done during hospitalization showed progressive enlargement of metastatic disease.  Neurosurgery team was consulted who spoke with the patient.  She does not wish to proceed with surgical intervention and has Poor prognosis overall.  Patient gets episodes of agitation restlessness confusion on steroids so this has been discontinued at this time. -She is also seen by psychiatry - she appeared calm and cooperative   History of stage IV metastatic  lung cancer, brain mets with left-sided weakness: History of palliative radiation between 03/18/2021 to 04/01/2021.   Radonc  and medical oncology made aware of this admission Continue Keppra, resume oxycodone and gabapentin if necessary.  Patient denies any pain at this time.  Palliative care on board plans for skilled nursing facility with palliative care.  Addendum Case discussed with oncology Dr. Julien Nordmann state patient has history of  noncompliant, not a candidate for any systemic therapy from medical oncology standpoint, Dr. Earlie Server recommend palliative care and hospice, currently awaiting for guardianship to continue goals of care discussion.   History of chronic combined systolic and diastolic CHF: Continue Coreg.  Compensated.  Last known ejection fraction 40 to 45%.   Stage 3b CKD: Stable at baseline, latest creatinine at 2.3  Check BMP in AM.   Anemia of chronic disease, malignancy  Latest hemoglobin was 8.2 check CBC in AM.   Thrombocytopenia, pancytopenia:  Mild.  No evidence of bleeding.  Latest platelet count of 196   Hx of polysubstance abuse: Continue thiamine.    Severe protein calorie malnutrition  Continue nutritional supplements    History of recurrent falls/deconditioning and debility: Plan is for skilled nursing facility placement.  Spoke with TOC who is on board regarding disposition.      The patients BMI is: Body mass index is 16.23 kg/m.Marland Kitchen Seen by dietician.  I agree with the assessment and plan as outlined below: Nutrition Status: Nutrition Problem: Severe Malnutrition Etiology: chronic illness, cancer and cancer related treatments Signs/Symptoms: energy intake < or equal to 75%  for > or equal to 1 month, mild fat depletion, severe muscle depletion Interventions: Ensure Enlive (each supplement provides 350kcal and 20 grams of protein), MVI  .    Unresulted Labs (From admission, onward)    None         DVT prophylaxis: Place and maintain  sequential compression device Start: 04/19/21 1446   Code Status: Full Family Communication: Son at bedside Disposition:   Status is: Inpatient  Dispo: The patient is from: Home              Anticipated d/c is to: TBD              Anticipated d/c date is: TBD, awaiting for guardianship and placement                Consultants:  Medical oncology Dr. Julien Nordmann Radiation oncology Neurosurgery Psychiatry Palliative care   Antimicrobials:    Anti-infectives (From admission, onward)    None          Objective: Vitals:   04/26/21 1845 04/26/21 2024 04/27/21 0610 04/27/21 1308  BP: 105/68 101/75 107/71 130/87  Pulse: (!) 101 (!) 101 (!) 106 100  Resp:  18 18 18   Temp:  99.4 F (37.4 C) 98.5 F (36.9 C) 98.2 F (36.8 C)  TempSrc:  Oral Oral Oral  SpO2:  100% 97% 98%  Weight:   45.6 kg   Height:        Intake/Output Summary (Last 24 hours) at 04/27/2021 1700 Last data filed at 04/27/2021 1043 Gross per 24 hour  Intake 123 ml  Output 300 ml  Net -177 ml   Filed Weights   04/24/21 0510 04/26/21 0537 04/27/21 0610  Weight: 50.3 kg 46.7 kg 45.6 kg    Examination:  General exam: weak, chronically ill, aaox3 currently Respiratory system: Clear to auscultation. Respiratory effort normal. Cardiovascular system:  RRR.  Gastrointestinal system: Abdomen is nondistended, soft and nontender.  Normal bowel sounds heard. Central nervous system: Alert and oriented. Left hemiplegia Extremities:  no edema, not able to lift lower extremity against gravity Skin: No rashes, lesions or ulcers Psychiatry: Currently no agitation    Data Reviewed: I have personally reviewed following labs and imaging studies  CBC: Recent Labs  Lab 04/22/21 0511  WBC 4.3  HGB 8.2*  HCT 24.9*  MCV 103.3*  PLT 761    Basic Metabolic Panel: Recent Labs  Lab 04/22/21 0511  NA 135  K 4.0  CL 105  CO2 22  GLUCOSE 109*  BUN 39*  CREATININE 2.36*  CALCIUM 9.7    GFR: Estimated  Creatinine Clearance: 18.7 mL/min (A) (by C-G formula based on SCr of 2.36 mg/dL (H)).  Liver Function Tests: No results for input(s): AST, ALT, ALKPHOS, BILITOT, PROT, ALBUMIN in the last 168 hours.  CBG: No results for input(s): GLUCAP in the last 168 hours.   No results found for this or any previous visit (from the past 240 hour(s)).       Radiology Studies: No results found.      Scheduled Meds:  carvedilol  3.125 mg Oral BID   Chlorhexidine Gluconate Cloth  6 each Topical Daily   feeding supplement  237 mL Oral BID BM   folic acid  1 mg Oral Daily   levETIRAcetam  500 mg Oral BID   multivitamin with minerals  1 tablet Oral Daily   pantoprazole  40 mg Oral Daily   polyethylene glycol  17 g Oral BID  sodium chloride flush  10-40 mL Intracatheter Q12H   sodium chloride flush  3 mL Intravenous Q12H   thiamine  100 mg Oral Daily   Continuous Infusions:  sodium chloride       LOS: 19 days   Greater than 50% of this time was spent in counseling, explanation of diagnosis, planning of further management, and coordination of care.   Voice Recognition Viviann Spare dictation system was used to create this note, attempts have been made to correct errors. Please contact the author with questions and/or clarifications.   Florencia Reasons, MD PhD FACP [facility]  Available via Epic secure chat 7am-7pm for nonurgent issues Please page for urgent issues To page the attending provider between 7A-7P or the covering provider during after hours 7P-7A, please log into the web site [email or web address] and access using universal [place] password for that web site. If you do not have the password, please call the hospital operator.    04/27/2021, 5:00 PM

## 2021-04-28 ENCOUNTER — Inpatient Hospital Stay: Admit: 2021-04-28 | Payer: Medicaid Other

## 2021-04-28 NOTE — Progress Notes (Addendum)
PROGRESS NOTE    Brenda [name]  ALP:[id number] DOB: 08/23/1960 DOA: 04/07/2021 PCP: Pcp, No    Chief Complaint  Patient presents with   Failure To Thrive    Brief Narrative:   Patient was recently admitted hospital for lung cancer with brain metastasis and had undergone radiation treatment and was discharged home on steroids.  At this time, patient's son wishes her to be placed as he could not take care of her at home.   patient was seen by neurosurgery.  Patient has refused surgical intervention for her brain metastasis. Palliative care following  per Dr. Erlinda Hong: awaiting for disposition to skilled nursing facility with palliative care follow-up.  APS is NOT pursing guardianship   Subjective:  Eating breakfast, says she slept well   Assessment & Plan:   Principal Problem:   Altered mental status Active Problems:   Alcohol abuse   Malignant neoplasm of right lung (HCC)   Acute encephalopathy   Protein-calorie malnutrition, severe   Brain mass    Acute metabolic encephalopathy with delirium -brought into the hospital by her son who reported that she was aggressive, agitated, restless and acting evil and out of it at times as per the son.  She was also talking irrelevant things and looking at picture at the wall. -CT head scan done during hospitalization showed progressive enlargement of metastatic disease.  Neurosurgery team was consulted who spoke with the patient.  She does not wish to proceed with surgical intervention and has Poor prognosis overall.  Patient gets episodes of agitation restlessness confusion on steroids so this has been discontinued at this time. -She has also seen by psychiatry -APS not pursuing guardianship  History of stage IV metastatic lung cancer, brain mets with left-sided weakness: History of palliative radiation between 03/18/2021 to 04/01/2021.   Radonc  and medical oncology made aware of this admission Continue Keppra, resume oxycodone and  gabapentin if necessary.  Patient denies any pain at this time.  Palliative care on board plans for skilled nursing facility with palliative care.   History of chronic combined systolic and diastolic CHF: Continue Coreg.  Compensated.  Last known ejection fraction 40 to 45%.   Stage 3b CKD: Stable at baseline   Anemia of chronic disease, malignancy  Latest hemoglobin was 8.2    Thrombocytopenia, pancytopenia:  Mild.  No evidence of bleeding.  Latest platelet count of 196   Hx of polysubstance abuse: Continue thiamine.    Severe protein calorie malnutrition  Continue nutritional supplements    History of recurrent falls/deconditioning and debility: Plan is for skilled nursing facility placement?  No bed offers as of 2/2      The patients BMI is: Body mass index is 16.23 kg/m.Marland Kitchen Seen by dietician.  I agree with the assessment and plan as outlined below: Nutrition Status: Nutrition Problem: Severe Malnutrition Etiology: chronic illness, cancer and cancer related treatments Signs/Symptoms: energy intake < or equal to 75% for > or equal to 1 month, mild fat depletion, severe muscle depletion Interventions: Ensure Enlive (each supplement provides 350kcal and 20 grams of protein), MVI     DVT prophylaxis: Place and maintain sequential compression device Start: 04/19/21 1446   Code Status: Full Family Communication: Son at bedside- sleeping Disposition:   Status is: Inpatient  Dispo: The patient is from: Home              Anticipated d/c is to: TBD              Anticipated  d/c date is: TBD, awaiting for guardianship and placement                Consultants:  Medical oncology Dr. Julien Nordmann Radiation oncology Neurosurgery Psychiatry Palliative care     Objective: Vitals:   04/27/21 0610 04/27/21 1308 04/27/21 2037 04/28/21 0529  BP: 107/71 130/87 102/70 103/73  Pulse: (!) 106 100 (!) 110 (!) 102  Resp: 18 18 19 15   Temp: 98.5 F (36.9 C) 98.2 F (36.8 C) 99.3  F (37.4 C) 98.2 F (36.8 C)  TempSrc: Oral Oral Oral   SpO2: 97% 98% 99% 99%  Weight: 45.6 kg     Height:        Intake/Output Summary (Last 24 hours) at 04/28/2021 0848 Last data filed at 04/27/2021 2210 Gross per 24 hour  Intake 690 ml  Output --  Net 690 ml   Filed Weights   04/24/21 0510 04/26/21 0537 04/27/21 0610  Weight: 50.3 kg 46.7 kg 45.6 kg    Examination:   General: Appearance:    Thin female in no acute distress     Lungs:     respirations unlabored  Heart:    Tachycardic.   MS:   All extremities are intact.    Neurologic:   Awake, alert, pleasantly confused       Data Reviewed: I have personally reviewed following labs and imaging studies  CBC: Recent Labs  Lab 04/22/21 0511  WBC 4.3  HGB 8.2*  HCT 24.9*  MCV 103.3*  PLT 253    Basic Metabolic Panel: Recent Labs  Lab 04/22/21 0511  NA 135  K 4.0  CL 105  CO2 22  GLUCOSE 109*  BUN 39*  CREATININE 2.36*  CALCIUM 9.7    GFR: Estimated Creatinine Clearance: 18.7 mL/min (A) (by C-G formula based on SCr of 2.36 mg/dL (H)).  Liver Function Tests: No results for input(s): AST, ALT, ALKPHOS, BILITOT, PROT, ALBUMIN in the last 168 hours.  CBG: No results for input(s): GLUCAP in the last 168 hours.   No results found for this or any previous visit (from the past 240 hour(s)).       Radiology Studies: No results found.      Scheduled Meds:  carvedilol  3.125 mg Oral BID   Chlorhexidine Gluconate Cloth  6 each Topical Daily   feeding supplement  237 mL Oral BID BM   folic acid  1 mg Oral Daily   levETIRAcetam  500 mg Oral BID   multivitamin with minerals  1 tablet Oral Daily   pantoprazole  40 mg Oral Daily   polyethylene glycol  17 g Oral BID   sodium chloride flush  10-40 mL Intracatheter Q12H   sodium chloride flush  3 mL Intravenous Q12H   thiamine  100 mg Oral Daily   Continuous Infusions:  sodium chloride       LOS: 20 days   Greater than 50% of this time was  spent in counseling, explanation of diagnosis, planning of further management, and coordination of care.     Geradine Girt, DO [facility]  Available via Epic secure chat 7am-7pm for nonurgent issues Please page for urgent issues To page the attending provider between 7A-7P or the covering provider during after hours 7P-7A, please log into the web site [email or web address] and access using universal [place] password for that web site. If you do not have the password, please call the hospital operator.    04/28/2021, 8:48 AM

## 2021-04-28 NOTE — TOC Progression Note (Signed)
Transition of Care [facility]) - Progression Note    Patient Details  Name: Brenda [name] MRN: [id number] Date of Birth: 03-11-1961  Transition of Care [facility] CM/SW Contact  Ross Ludwig, [place] Phone Number: 04/28/2021, 1:14 PM  Clinical Narrative:     CSW was informed that DSS has been contacted and TOC has not heard back from them yet.  Patient's family are still the main decision makers until DSS decides to pursue.  CSW continuing to look for placement, CSW contacted Joaquim Nam from [facility] [phone] asked if any of facilities would be able to accept patient.  She said to contact [facility], CSW asked Janie at [facility] to review patient, she said she will.  Patient does have Medicaid and receiving SSI, patient does not have any active drug or alcohol use.  CSW also contacted Claiborne Billings at [facility] to see if she can check with her regional director to see if any their facilities have any LTC beds available.  CSW updated FL2 and sent updated clinical information to all SNFs in the region.  TOC to continue to follow patient's progress throughout discharge planning.     Barriers to Discharge: ED Facility/Family Refusing to Allow Patient to Return  Expected Discharge Plan and Services                                                 Social Determinants of Health (SDOH) Interventions    Readmission Risk Interventions Readmission Risk Prevention Plan 03/14/2021 07/17/2020 04/11/2020  Transportation Screening Complete Complete Complete  PCP or Specialist Appt within 3-5 Days - - Complete  HRI or [place] - Complete Complete  Social Work Consult for [place] Planning/Counseling - Complete Complete  Palliative Care Screening - Not Applicable Not Applicable  Medication Review Press photographer) Complete Complete Complete  PCP or Specialist appointment within 3-5 days of discharge Complete - -  [place] or Home Care Consult Complete - -   SW Recovery Care/Counseling Consult Complete - -  Palliative Care Screening Complete - -  [place] Patient Refused - -  Some recent data might be hidden

## 2021-04-28 NOTE — NC FL2 (Signed)
[place] LEVEL OF CARE SCREENING TOOL     IDENTIFICATION  Patient Name: Brenda [name] Birthdate: 1960-06-20 Sex: female Admission Date (Current Location): 04/07/2021  [place] and [place] Number:  Kathleen Argue [id number] [place] and Address:  [facility],  [place], [place]      Provider Number: [id number]  Attending Physician Name and Address:  Geradine Girt, DO  Relative Name and Phone Number:  Melody Haver Niece [phone]  [phone]  [name]   [phone]  Clearnce Sorrel   [phone]    Current Level of Care: Hospital Recommended Level of Care: [place] Prior Approval Number:    Date Approved/Denied:   PASRR Number: [id number]  Discharge Plan: SNF    Current Diagnoses: Patient Active Problem List   Diagnosis Date Noted   Brain mass 04/08/2021   Altered mental status 04/07/2021   Protein-calorie malnutrition, severe 03/12/2021   Primary malignant neoplasm of lung with metastasis to brain ([place]) 03/11/2021   Exudative pleural effusion    Sinus tachycardia    Pleural effusion    S/P thoracentesis    Hypomagnesemia    Acute on chronic combined systolic and diastolic congestive heart failure ([place])    Bacteremia associated with intravascular line ([place]) 07/03/2020   Acute blood loss anemia    Renal failure 07/01/2020   AKI (acute kidney injury) ([place])    Metabolic acidosis    Epistaxis    Seizure-like activity (HCC)    Swelling of lower extremity 05/29/2020   Hypoalbuminemia 05/29/2020   Acute on chronic anemia 04/07/2020   Gastrointestinal hemorrhage 04/07/2020   Acute encephalopathy 04/07/2020   Pancytopenia ([place]) 04/07/2020   Polysubstance abuse ([place]) 04/07/2020   Hypokalemia 10/19/2019   Neutropenia ([place]) 10/05/2019   Malignant neoplasm of right lung ([place]) 09/14/2019   Encounter for antineoplastic chemotherapy 09/14/2019   Encounter for antineoplastic immunotherapy  09/14/2019   Goals of care, counseling/discussion 09/14/2019   Tobacco abuse counseling 09/14/2019   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 ([place])    Alcohol abuse    Acute respiratory failure ([place]) 04/03/2019   Lung mass    Multifocal pneumonia    Major depressive disorder    Major depressive disorder, recurrent episode with mood-congruent psychotic features ([place]) 05/30/2017   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[place]) 12/26/2011    Orientation RESPIRATION BLADDER Height & Weight     Self, Place  Normal Incontinent Weight: 100 lb 8.5 oz (45.6 kg) Height:  5\' 6"  (167.6 cm)  BEHAVIORAL SYMPTOMS/MOOD NEUROLOGICAL BOWEL NUTRITION STATUS      Incontinent Diet (Soft diet)  AMBULATORY STATUS COMMUNICATION OF NEEDS Skin   Limited Assist Verbally Normal                       Personal Care Assistance Level of Assistance  Bathing, Feeding, Dressing Bathing Assistance: Limited assistance Feeding assistance: Independent Dressing Assistance: Limited assistance     Functional Limitations Info  Sight, Hearing, Speech Sight Info: Impaired Hearing Info: Adequate Speech Info: Adequate    SPECIAL CARE FACTORS FREQUENCY  PT (By licensed PT), OT (By licensed OT)                    Contractures Contractures Info: Not present    Additional Factors Info  Code Status, Allergies Code Status Info: Full Allergies Info: Aspirin           Current Medications (04/28/2021):  This is the current hospital active medication list Current Facility-Administered Medications  Medication Dose Route Frequency Provider Last Rate Last Admin   0.9 %  sodium chloride infusion  250 mL Intravenous PRN Pokhrel, Laxman, MD       acetaminophen (TYLENOL) tablet 650 mg  650 mg Oral Q6H PRN Pokhrel, Laxman, MD   650 mg at 04/23/21 1944   albuterol (PROVENTIL) (2.5 MG/3ML) 0.083% nebulizer solution 2.5 mg  2.5 mg Nebulization Q2H PRN Pokhrel, Laxman, MD       alum & mag hydroxide-simeth  (MAALOX/MYLANTA) 200-200-20 MG/5ML suspension 30 mL  30 mL Oral Q4H PRN Pokhrel, Laxman, MD   30 mL at 04/08/21 2353   bisacodyl (DULCOLAX) suppository 10 mg  10 mg Rectal Daily PRN Pokhrel, Laxman, MD       carvedilol (COREG) tablet 3.125 mg  3.125 mg Oral BID Florencia Reasons, MD   3.125 mg at 04/28/21 1856   Chlorhexidine Gluconate Cloth 2 % PADS 6 each  6 each Topical Daily Pahwani, Rinka R, MD   6 each at 04/28/21 0939   feeding supplement (ENSURE ENLIVE / ENSURE PLUS) liquid 237 mL  237 mL Oral BID BM Pokhrel, Laxman, MD   237 mL at 31/49/70 2637   folic acid (FOLVITE) tablet 1 mg  1 mg Oral Daily Pokhrel, Laxman, MD   1 mg at 04/28/21 8588   hydrALAZINE (APRESOLINE) injection 10 mg  10 mg Intravenous Q6H PRN Pokhrel, Laxman, MD       levETIRAcetam (KEPPRA) tablet 500 mg  500 mg Oral BID Pokhrel, Laxman, MD   500 mg at 04/28/21 0938   LORazepam (ATIVAN) tablet 0.5 mg  0.5 mg Oral Q6H PRN Pokhrel, Laxman, MD   0.5 mg at 04/23/21 1944   multivitamin with minerals tablet 1 tablet  1 tablet Oral Daily Pokhrel, Laxman, MD   1 tablet at 04/28/21 0939   oxyCODONE (ROXICODONE) 5 MG/5ML solution 5 mg  5 mg Oral Q4H PRN Micheline Rough, MD   5 mg at 04/28/21 0938   pantoprazole (PROTONIX) EC tablet 40 mg  40 mg Oral Daily Pahwani, Rinka R, MD   40 mg at 04/28/21 0938   polyethylene glycol (MIRALAX / GLYCOLAX) packet 17 g  17 g Oral BID Pokhrel, Laxman, MD   17 g at 04/28/21 0938   sodium chloride flush (NS) 0.9 % injection 10-40 mL  10-40 mL Intracatheter Q12H Pahwani, Rinka R, MD   10 mL at 04/28/21 0939   sodium chloride flush (NS) 0.9 % injection 10-40 mL  10-40 mL Intracatheter PRN Pahwani, Rinka R, MD   10 mL at 04/27/21 2210   sodium chloride flush (NS) 0.9 % injection 3 mL  3 mL Intravenous Q12H Pokhrel, Laxman, MD   3 mL at 04/28/21 0939   sodium chloride flush (NS) 0.9 % injection 3 mL  3 mL Intravenous PRN Pokhrel, Laxman, MD   3 mL at 04/27/21 2210   thiamine tablet 100 mg  100 mg Oral Daily Pokhrel,  Laxman, MD   100 mg at 04/28/21 5027     Discharge Medications: Please see discharge summary for a list of discharge medications.  Relevant Imaging Results:  Relevant Lab Results:   Additional Information SSN [id number]  Ross Ludwig, LCSW

## 2021-04-29 LAB — BASIC METABOLIC PANEL
Anion gap: 8 (ref 5–15)
BUN: 38 mg/dL — ABNORMAL HIGH (ref 6–20)
CO2: 27 mmol/L (ref 22–32)
Calcium: 9.6 mg/dL (ref 8.9–10.3)
Chloride: 101 mmol/L (ref 98–111)
Creatinine, Ser: 2.62 mg/dL — ABNORMAL HIGH (ref 0.44–1.00)
GFR, Estimated: 21 mL/min — ABNORMAL LOW (ref >60)
Glucose, Bld: 135 mg/dL — ABNORMAL HIGH (ref 70–99)
Potassium: 3.7 mmol/L (ref 3.5–5.1)
Sodium: 136 mmol/L (ref 135–145)

## 2021-04-29 LAB — CBC WITH DIFFERENTIAL/PLATELET
Abs Immature Granulocytes: 0.09 10*3/uL — ABNORMAL HIGH (ref 0.00–0.07)
Basophils Absolute: 0 10*3/uL (ref 0.0–0.1)
Basophils Relative: 0 %
Eosinophils Absolute: 0 10*3/uL (ref 0.0–0.5)
Eosinophils Relative: 0 %
HCT: 23.9 % — ABNORMAL LOW (ref 36.0–46.0)
Hemoglobin: 7.6 g/dL — ABNORMAL LOW (ref 12.0–15.0)
Immature Granulocytes: 2 %
Lymphocytes Relative: 23 %
Lymphs Abs: 1.1 10*3/uL (ref 0.7–4.0)
MCH: 33.8 pg (ref 26.0–34.0)
MCHC: 31.8 g/dL (ref 30.0–36.0)
MCV: 106.2 fL — ABNORMAL HIGH (ref 80.0–100.0)
Monocytes Absolute: 1.1 10*3/uL — ABNORMAL HIGH (ref 0.1–1.0)
Monocytes Relative: 23 %
Neutro Abs: 2.5 10*3/uL (ref 1.7–7.7)
Neutrophils Relative %: 52 %
Platelets: 293 10*3/uL (ref 150–400)
RBC: 2.25 MIL/uL — ABNORMAL LOW (ref 3.87–5.11)
RDW: 12.9 % (ref 11.5–15.5)
WBC: 4.8 10*3/uL (ref 4.0–10.5)
nRBC: 0 % (ref 0.0–0.2)

## 2021-04-29 LAB — RETICULOCYTES
Immature Retic Fract: 20 % — ABNORMAL HIGH (ref 2.3–15.9)
RBC.: 2.23 MIL/uL — ABNORMAL LOW (ref 3.87–5.11)
Retic Count, Absolute: 28.1 10*3/uL (ref 19.0–186.0)
Retic Ct Pct: 1.3 % (ref 0.4–3.1)

## 2021-04-29 LAB — FOLATE: Folate: 62.1 ng/mL (ref >5.9)

## 2021-04-29 LAB — IRON AND TIBC
Iron: 25 ug/dL — ABNORMAL LOW (ref 28–170)
Saturation Ratios: 11 % (ref 10.4–31.8)
TIBC: 220 ug/dL — ABNORMAL LOW (ref 250–450)
UIBC: 195 ug/dL

## 2021-04-29 LAB — FERRITIN: Ferritin: 700 ng/mL — ABNORMAL HIGH (ref 11–307)

## 2021-04-29 LAB — VITAMIN B12: Vitamin B-12: 421 pg/mL (ref 180–914)

## 2021-04-29 MED ORDER — SODIUM CHLORIDE 0.9 % IV SOLN: INTRAVENOUS | Status: DC

## 2021-04-29 MED ORDER — VITAMIN B-12 1000 MCG PO TABS
500.0000 ug | ORAL_TABLET | Freq: Every day | ORAL | Status: DC
  Administered 2021-04-29 – 2021-05-10 (×12): 500 ug via ORAL
  Filled 2021-04-29 (×12): qty 1

## 2021-04-29 MED ORDER — FERROUS SULFATE 325 (65 FE) MG PO TABS
325.0000 mg | ORAL_TABLET | Freq: Every day | ORAL | Status: DC
  Administered 2021-04-30 – 2021-05-10 (×11): 325 mg via ORAL
  Filled 2021-04-29 (×10): qty 1

## 2021-04-29 NOTE — Progress Notes (Signed)
Nutrition Follow-up  DOCUMENTATION CODES:   Underweight, Severe malnutrition in context of chronic illness  INTERVENTION:   -Ensure Plus High Protein po BID, each supplement provides 350 kcal and 20 grams of protein.    -Magic cup BID with meals, each supplement provides 290 kcal and 9 grams of protein   -Multivitamin with minerals daily  NUTRITION DIAGNOSIS:   Severe Malnutrition related to chronic illness, cancer and cancer related treatments as evidenced by energy intake < or equal to 75% for > or equal to 1 month, mild fat depletion, severe muscle depletion.  Ongoing.  GOAL:   Patient will meet greater than or equal to 90% of their needs  Progressing.  MONITOR:   PO intake, Supplement acceptance, Labs, Weight trends, I & O's  ASSESSMENT:   61 y.o. female with past medical history of stage IV lung cancer with recurrent falls who was recently discharged from the hospital after being treated for metastatic lung cancer with brain mets.  Patient consuming 10-50% of meals at this time. Disoriented today. Supplements are being accepted.  Admission weight: 108 lbs Current weight: 103 lbs.  Medications: Folic acid, Multivitamin with minerals daily, Miralax, Thiamine  Labs reviewed: Low iron (25)  Diet Order:   Diet Order             DIET DYS 3 Room service appropriate? Yes; Fluid consistency: Thin  Diet effective now                   EDUCATION NEEDS:   No education needs have been identified at this time  Skin:  Skin Assessment: Reviewed RN Assessment  Last BM:  2/7 -type 6  Height:   Ht Readings from Last 1 Encounters:  04/09/21 5\' 6"  (1.676 m)    Weight:   Wt Readings from Last 1 Encounters:  04/29/21 46.9 kg    BMI:  Body mass index is 16.69 kg/m.  Estimated Nutritional Needs:   Kcal:  2000-2200  Protein:  90-100g  Fluid:  2L/day  Clayton Bibles, MS, RD, LDN Inpatient Clinical Dietitian Contact information available via Amion

## 2021-04-29 NOTE — Progress Notes (Signed)
PROGRESS NOTE    Brenda [name]  UDJ:[id number] DOB: 1960-11-22 DOA: 04/07/2021 PCP: Pcp, No   Brief Narrative: 61 year old with past medical history significant for stage IV lung cancer with recurrent falls who was recently discharged from the hospital after being treated for metastatic lung cancer with brain mets, was brought into hospital by her son who reported that she was aggressive, agitated and restless.  Patient had undergone radiation treatment and was discharged home on a steroid. Patient's son wishes for patient to be placed, he is unable to take care of her at home. Patient was evaluated by neurosurgery, patient has refused surgical intervention for her brain mets. Per notes APA is not pursuing guardianship. Patient awaiting placement.   Assessment & Plan:   Principal Problem:   Altered mental status Active Problems:   Alcohol abuse   Malignant neoplasm of right lung (HCC)   Acute encephalopathy   Protein-calorie malnutrition, severe   Brain mass  1-Acute metabolic encephalopathy with delirium: Brought to the hospital by son, because patient was reported to be aggressive agitated restless and acting evil.  -CT head scan: Show progressive enlargement of metastatic disease. Surgery team was consulted with spoke with patient, she does not wish to proceed with surgical intervention and has poor overall prognosis. Patient gets episode of agitation and restlessness confusion on a steroid this medication has been discontinued APS not pursuing guardianship.  History of stage IV metastatic lung cancer, brain mets with left-sided weakness: Status post palliative radiation 03/18/2021 until 04/01/2021 Radiation oncology and medical oncology were made aware of this admission. Continue with Keppra, OxyContin and gabapentin Palliative care consulted and following.  Plan for skilled nursing facility with palliative care  History of chronic combined systolic and diastolic heart  failure Continue with Coreg  Stage IIIb CKD: Plan to repeat be met today  Anemia of chronic disease, malignancy Plan to repeat CBC today  Thrombocytopenia, pancytopenia:  Follow Trend History of polysubstance abuse: Continue with thiamine Severe protein calorie malnutrition: Continue with nutrition supplements History of recurrent falls deconditioning and debility: Needed skilled facility placement    Nutrition Problem: Severe Malnutrition Etiology: chronic illness, cancer and cancer related treatments    Signs/Symptoms: energy intake < or equal to 75% for > or equal to 1 month, mild fat depletion, severe muscle depletion    Interventions: Ensure Enlive (each supplement provides 350kcal and 20 grams of protein), MVI  Estimated body mass index is 16.69 kg/m as calculated from the following:   Height as of this encounter: _0  (1.676 m).   Weight as of this encounter: 46.9 kg.   DVT prophylaxis: SCD Code Status: Full code Family Communication: No family at bedside.  Disposition Plan:  Status is: Inpatient Remains inpatient appropriate because: awaiting placement.             Consultants:  Medical oncology Dr. Julien Nordmann Radiation oncology Neurosurgery Psychiatry Palliative care    Procedures:  None  Antimicrobials:    Subjective: She is alert, denies headaches, pleasant, report eating. Per nurse patient needs to be fed.    Objective: Vitals:   04/28/21 1322 04/28/21 2016 04/29/21 0435 04/29/21 0450  BP: (!) 86/62 111/70  104/61  Pulse: (!) 101 (!) 101  (!) 51  Resp: 14 15    Temp: 98.4 F (36.9 C) 98.5 F (36.9 C)  98 F (36.7 C)  TempSrc: Oral Oral  Oral  SpO2: 100% 98%  99%  Weight:   46.9 kg   Height:  Intake/Output Summary (Last 24 hours) at 04/29/2021 1504 Last data filed at 04/29/2021 0914 Gross per 24 hour  Intake 180 ml  Output 200 ml  Net -20 ml   Filed Weights   04/26/21 0537 04/27/21 0610 04/29/21 0435  Weight: 46.7  kg 45.6 kg 46.9 kg    Examination:  General exam: Appears calm and comfortable  Respiratory system: Clear to auscultation. Respiratory effort normal. Cardiovascular system: S1 & S2 heard, RRR.  Gastrointestinal system: Abdomen is nondistended, soft and nontender. No organomegaly or masses felt. Normal bowel sounds heard. Central nervous system: Alert and oriented Extremities: no edema    Data Reviewed: I have personally reviewed following labs and imaging studies  CBC: No results for input(s): WBC, NEUTROABS, HGB, HCT, MCV, PLT in the last 168 hours. Basic Metabolic Panel: Recent Labs  Lab 04/29/21 1115  NA 136  K 3.7  CL 101  CO2 27  GLUCOSE 135*  BUN 38*  CREATININE 2.62*  CALCIUM 9.6   GFR: Estimated Creatinine Clearance: 17.3 mL/min (A) (by C-G formula based on SCr of 2.62 mg/dL (H)). Liver Function Tests: No results for input(s): AST, ALT, ALKPHOS, BILITOT, PROT, ALBUMIN in the last 168 hours. No results for input(s): LIPASE, AMYLASE in the last 168 hours. No results for input(s): AMMONIA in the last 168 hours. Coagulation Profile: No results for input(s): INR, PROTIME in the last 168 hours. Cardiac Enzymes: No results for input(s): CKTOTAL, CKMB, CKMBINDEX, TROPONINI in the last 168 hours. BNP (last 3 results) No results for input(s): PROBNP in the last 8760 hours. HbA1C: No results for input(s): HGBA1C in the last 72 hours. CBG: No results for input(s): GLUCAP in the last 168 hours. Lipid Profile: No results for input(s): CHOL, HDL, LDLCALC, TRIG, CHOLHDL, LDLDIRECT in the last 72 hours. Thyroid Function Tests: No results for input(s): TSH, T4TOTAL, FREET4, T3FREE, THYROIDAB in the last 72 hours. Anemia Panel: Recent Labs    04/29/21 1116 04/29/21 1117  VITAMINB12 421  --   FOLATE  --  62.1  FERRITIN 700*  --   TIBC 220*  --   IRON 25*  --   RETICCTPCT  --  1.3   Sepsis Labs: No results for input(s): PROCALCITON, LATICACIDVEN in the last 168  hours.  No results found for this or any previous visit (from the past 240 hour(s)).       Radiology Studies: No results found.      Scheduled Meds:  carvedilol  3.125 mg Oral BID   Chlorhexidine Gluconate Cloth  6 each Topical Daily   feeding supplement  237 mL Oral BID BM   folic acid  1 mg Oral Daily   levETIRAcetam  500 mg Oral BID   multivitamin with minerals  1 tablet Oral Daily   pantoprazole  40 mg Oral Daily   polyethylene glycol  17 g Oral BID   sodium chloride flush  10-40 mL Intracatheter Q12H   sodium chloride flush  3 mL Intravenous Q12H   thiamine  100 mg Oral Daily   Continuous Infusions:  sodium chloride       LOS: 21 days    Time spent: 35  minutes    Brenda [name] A Braniya Farrugia, MD [facility]   If 7PM-7AM, please contact night-coverage [email or web address]  04/29/2021, 3:04 PM

## 2021-04-30 DIAGNOSIS — I5042 Chronic combined systolic (congestive) and diastolic (congestive) heart failure: Secondary | ICD-10-CM

## 2021-04-30 DIAGNOSIS — D638 Anemia in other chronic diseases classified elsewhere: Secondary | ICD-10-CM

## 2021-04-30 DIAGNOSIS — N1832 Chronic kidney disease, stage 3b: Secondary | ICD-10-CM

## 2021-04-30 LAB — BASIC METABOLIC PANEL
Anion gap: 10 (ref 5–15)
BUN: 38 mg/dL — ABNORMAL HIGH (ref 6–20)
CO2: 24 mmol/L (ref 22–32)
Calcium: 9.4 mg/dL (ref 8.9–10.3)
Chloride: 102 mmol/L (ref 98–111)
Creatinine, Ser: 2.47 mg/dL — ABNORMAL HIGH (ref 0.44–1.00)
GFR, Estimated: 22 mL/min — ABNORMAL LOW (ref >60)
Glucose, Bld: 115 mg/dL — ABNORMAL HIGH (ref 70–99)
Potassium: 3.8 mmol/L (ref 3.5–5.1)
Sodium: 136 mmol/L (ref 135–145)

## 2021-04-30 MED ORDER — OXYCODONE HCL 5 MG PO TABS
5.0000 mg | ORAL_TABLET | Freq: Once | ORAL | Status: DC
  Filled 2021-04-30: qty 1

## 2021-04-30 NOTE — Progress Notes (Signed)
°  Progress Note   Patient: Brenda [name] OIT:[id number] DOB: 04/18/1960 DOA: 04/07/2021     22 DOS: the patient was seen and examined on 04/30/2021   Brief hospital course: 61 year old past medical history significant for stage IV lung cancer with recurrent falls who was recently discharged from hospital after being treated for metastatic lung cancer with brain mets, was brought into the hospital by her son who reported that she was aggressive, agitated and restless.  Patient had undergone radiation treatment and was discharged home on steroids. Patient's son wishes for patient to be placed, he is unable to take care of her at home.  Patient was evaluated by neurosurgery, patient refused surgical intervention for brain mets. Notes APA is now pursuing guardianship. Patient awaiting placement.  Assessment and Plan: Anemia of chronic disease Iron deficiency anemia. Iron level low at 25.  Hemoglobin slightly down to 7.6. Plan to start iron and B12 supplements  Stage 3b chronic kidney disease (CKD) (HCC) Baseline 2.5--2.7 Monitor renal function  Chronic combined systolic and diastolic congestive heart failure (HCC) Compensated.  Continue with Coreg  Protein-calorie malnutrition, severe- (present on admission) Continue with nutrition supplements.  Acute encephalopathy- (present on admission) Patient was brought by son to the hospital because she was reported to be aggressive, agitated, restless and acting Evil.  -CT head scan: Show progressive enlargement of metastatic disease. -Neurosurgery  Consulted, patient did not want to proceed with surgical intervention. -Patient gets episode of agitation and restlessness, confusion on any steroids.  This medication has been discontinued. -APS not pursuing guardianship.  Malignant neoplasm of right lung ([place])- (present on admission) Patient has a stage IV metastatic lung cancer to brain, with left-sided weakness. Status post palliative radiation  from 03/18/2021 until  04/01/2021. Radiation oncology and medical oncology were made aware of this admission. Continue with Keppra, OxyContin and gabapentin. Palliative care will  follow-up with family now that APS is not pursuing guardianship.   Alcohol abuse- (present on admission) History of alcohol substance abuse, no evidence of alcohol withdrawal.  She has received IV thiamine.        Subjective: She denies headaches. She is feeling well, she is in good spirit today.   Physical Exam: Vitals:   04/29/21 0435 04/29/21 0450 04/29/21 2111 04/30/21 0511  BP:  104/61 99/66 100/66  Pulse:  (!) 51 99 93  Resp:   18 16  Temp:  98 F (36.7 C) 98.8 F (37.1 C) 98.2 F (36.8 C)  TempSrc:  Oral Oral Oral  SpO2:  99% 99% 97%  Weight: 46.9 kg   50.7 kg  Height:       General: alert, follows command CVS; S 1, S 2 RRR Lung; CTA  Data Reviewed:  Bmet and CBC reviewed.   Family Communication: No family at bedside.   Disposition: Status is: Inpatient Remains inpatient appropriate because: awaiting placement.           Planned Discharge Destination: Skilled nursing facility     Time spent: 45 minutes  Author: Elmarie Shiley, MD 04/30/2021 12:49 PM  For on call review [email or web address].

## 2021-04-30 NOTE — TOC Progression Note (Signed)
Transition of Care [facility]) - Progression Note    Patient Details  Name: Brenda [name] MRN: [id number] Date of Birth: Jul 10, 1960  [facility] of [facility]) CM/SW Contact  Joaquin Courts, RN Phone Number: 04/30/2021, 2:48 PM  Clinical Narrative:    CM spoke with [place] and asked to review patient.  Outreached to [facility] admissions rep who sates cannot accept patient due to no LTC payor in place and patient would need to convert from community to [employer].  VM left for Principal facilities regional director with request to review.  CM also followed up with Trey Sailors (regional rep) for [place] facilities, no facilities able to offer at this time.     Barriers to Discharge: ED Facility/Family Refusing to Allow Patient to Return  Expected Discharge Plan and Services                                                 Social Determinants of Health (SDOH) Interventions    Readmission Risk Interventions Readmission Risk Prevention Plan 03/14/2021 07/17/2020 04/11/2020  Transportation Screening Complete Complete Complete  PCP or Specialist Appt within 3-5 Days - - Complete  HRI or [place] - Complete Complete  Social Work Consult for [place] Planning/Counseling - Complete Complete  Palliative Care Screening - Not Applicable Not Applicable  Medication Review Press photographer) Complete Complete Complete  PCP or Specialist appointment within 3-5 days of discharge Complete - -  [place] or Home Care Consult Complete - -  SW Recovery Care/Counseling Consult Complete - -  Palliative Care Screening Complete - -  [place] Patient Refused - -  Some recent data might be hidden

## 2021-04-30 NOTE — Assessment & Plan Note (Signed)
Compensated.  Continue with Coreg

## 2021-04-30 NOTE — Assessment & Plan Note (Addendum)
Iron deficiency anemia. Iron level low at 25.   Started  iron and B12 supplements Received IV iron 2/10. Hb down to 6.9. plan to transfuse one unit PRBC> 2/13. Hb post transfusion 9.3. repeat labs tomorrow.

## 2021-04-30 NOTE — Assessment & Plan Note (Signed)
History of alcohol substance abuse, no evidence of alcohol withdrawal.  She has received IV thiamine.

## 2021-04-30 NOTE — Hospital Course (Addendum)
61 year old past medical history significant for stage IV lung cancer with recurrent falls who was recently discharged from hospital after being treated for metastatic lung cancer with brain mets, was brought into the hospital by her son who reported that she was aggressive, agitated and restless.  Patient had undergone radiation treatment and was discharged home on steroids. Patient's son wishes for patient to be placed, he is unable to take care of her at home.  Patient was evaluated by neurosurgery, patient refused surgical intervention for brain mets. Per Notes: APS  is not pursuing guardianship. Patient awaiting placement. Hb down to 6.9. she will received one unit PRBC 2/13. Hb stable.

## 2021-04-30 NOTE — Progress Notes (Signed)
Labs drawn per MD order and per protocol. Labs tubed to lab. Kjones RN

## 2021-04-30 NOTE — Assessment & Plan Note (Addendum)
Patient was brought by son to the hospital because she was reported to be aggressive, agitated, restless and acting Evil.  -CT head scan: Show progressive enlargement of metastatic disease. -Neurosurgery  Consulted, patient did not want to proceed with surgical intervention. -Patient gets episode of agitation and restlessness, confusion on any steroids.  This medication has been discontinued. -APS not pursuing guardianship. -Remain stable.

## 2021-04-30 NOTE — Assessment & Plan Note (Addendum)
Baseline 2.5--2.7 Monitor renal function Cr down to 2.2

## 2021-04-30 NOTE — Assessment & Plan Note (Signed)
Continue with nutrition supplements.

## 2021-04-30 NOTE — Assessment & Plan Note (Addendum)
Patient has a stage IV metastatic lung cancer to brain, with left-sided weakness. Status post palliative radiation from 03/18/2021 until  04/01/2021. Radiation oncology and medical oncology were made aware of this admission. Continue with Keppra, OxyContin and gabapentin. Continue discussion with family for goals of care. See Dr Domingo Cocking note 2/12.

## 2021-05-01 NOTE — Progress Notes (Signed)
°  Progress Note   Patient: Brenda [name] OKH:[id number] DOB: 1960-09-23 DOA: 04/07/2021     23 DOS: the patient was seen and examined on 05/01/2021   Brief hospital course: 61 year old past medical history significant for stage IV lung cancer with recurrent falls who was recently discharged from hospital after being treated for metastatic lung cancer with brain mets, was brought into the hospital by her son who reported that she was aggressive, agitated and restless.  Patient had undergone radiation treatment and was discharged home on steroids. Patient's son wishes for patient to be placed, he is unable to take care of her at home.  Patient was evaluated by neurosurgery, patient refused surgical intervention for brain mets. Notes APA is now pursuing guardianship. Patient awaiting placement.  Assessment and Plan: Anemia of chronic disease Iron deficiency anemia. Iron level low at 25.  Hemoglobin slightly down to 7.6. Plan to start iron and B12 supplements Repeat labs tomorrow.   Stage 3b chronic kidney disease (CKD) (HCC) Baseline 2.5--2.7 Monitor renal function Cr down to 2.4  Chronic combined systolic and diastolic congestive heart failure (HCC) Compensated.  Continue with Coreg  Protein-calorie malnutrition, severe- (present on admission) Continue with nutrition supplements.  Acute encephalopathy- (present on admission) Patient was brought by son to the hospital because she was reported to be aggressive, agitated, restless and acting Evil.  -CT head scan: Show progressive enlargement of metastatic disease. -Neurosurgery  Consulted, patient did not want to proceed with surgical intervention. -Patient gets episode of agitation and restlessness, confusion on any steroids.  This medication has been discontinued. -APS not pursuing guardianship. -Stable.   Malignant neoplasm of right lung ([place])- (present on admission) Patient has a stage IV metastatic lung cancer to brain, with  left-sided weakness. Status post palliative radiation from 03/18/2021 until  04/01/2021. Radiation oncology and medical oncology were made aware of this admission. Continue with Keppra, OxyContin and gabapentin. Palliative care will  follow-up with family now that APS is not pursuing guardianship.   Alcohol abuse- (present on admission) History of alcohol substance abuse, no evidence of alcohol withdrawal.  She has received IV thiamine.        Subjective: she alert, denies pain,.   Physical Exam: Vitals:   04/30/21 2231 05/01/21 0553 05/01/21 0814 05/01/21 1315  BP: 95/74 100/66 105/71 94/68  Pulse: 96 99 99 100  Resp: 18 18 18 18   Temp: 98.2 F (36.8 C) (!) 97.3 F (36.3 C) 98 F (36.7 C) 98.4 F (36.9 C)  TempSrc: Oral Oral Oral Oral  SpO2: 98% 97%  92%  Weight:      Height:       General; Alert CVS; S 1 S 2 RRR Lung; CTA  Data Reviewed:  There are no new results to review at this time.  Family Communication: No family at bedside.   Disposition: Status is: Inpatient Remains inpatient appropriate because: awaiting placement          Planned Discharge Destination: Skilled nursing facility     Time spent: 45 minutes  Author: Elmarie Shiley, MD 05/01/2021 2:05 PM  For on call review [email or web address].

## 2021-05-01 NOTE — TOC Progression Note (Signed)
Transition of Care [facility]) - Progression Note    Patient Details  Name: Brenda [name] MRN: [id number] Date of Birth: January 03, 1961  [facility]) CM/SW Contact  Joaquin Courts, RN Phone Number: 05/01/2021, 4:08 PM  Clinical Narrative:    Damaris Schooner with [facility] at [facility] who has agreed to come in and evaluate patient in person for appropriateness for her facility.     Barriers to Discharge: ED Facility/Family Refusing to Allow Patient to Return  Expected Discharge Plan and Services                                                 Social Determinants of Health (SDOH) Interventions    Readmission Risk Interventions Readmission Risk Prevention Plan 03/14/2021 07/17/2020 04/11/2020  Transportation Screening Complete Complete Complete  PCP or Specialist Appt within 3-5 Days - - Complete  HRI or [place] - Complete Complete  Social Work Consult for [place] Planning/Counseling - Complete Complete  Palliative Care Screening - Not Applicable Not Applicable  Medication Review Press photographer) Complete Complete Complete  PCP or Specialist appointment within 3-5 days of discharge Complete - -  [place] or Home Care Consult Complete - -  SW Recovery Care/Counseling Consult Complete - -  Palliative Care Screening Complete - -  [place] Patient Refused - -  Some recent data might be hidden

## 2021-05-02 LAB — CBC
HCT: 21.6 % — ABNORMAL LOW (ref 36.0–46.0)
Hemoglobin: 7 g/dL — ABNORMAL LOW (ref 12.0–15.0)
MCH: 33.2 pg (ref 26.0–34.0)
MCHC: 32.4 g/dL (ref 30.0–36.0)
MCV: 102.4 fL — ABNORMAL HIGH (ref 80.0–100.0)
Platelets: 245 10*3/uL (ref 150–400)
RBC: 2.11 MIL/uL — ABNORMAL LOW (ref 3.87–5.11)
RDW: 12.6 % (ref 11.5–15.5)
WBC: 4.9 10*3/uL (ref 4.0–10.5)
nRBC: 0 % (ref 0.0–0.2)

## 2021-05-02 LAB — BASIC METABOLIC PANEL
Anion gap: 8 (ref 5–15)
BUN: 29 mg/dL — ABNORMAL HIGH (ref 6–20)
CO2: 25 mmol/L (ref 22–32)
Calcium: 10 mg/dL (ref 8.9–10.3)
Chloride: 103 mmol/L (ref 98–111)
Creatinine, Ser: 2.29 mg/dL — ABNORMAL HIGH (ref 0.44–1.00)
GFR, Estimated: 24 mL/min — ABNORMAL LOW (ref >60)
Glucose, Bld: 102 mg/dL — ABNORMAL HIGH (ref 70–99)
Potassium: 3.8 mmol/L (ref 3.5–5.1)
Sodium: 136 mmol/L (ref 135–145)

## 2021-05-02 MED ORDER — SODIUM CHLORIDE 0.9 % IV SOLN
250.0000 mg | Freq: Once | INTRAVENOUS | Status: AC
  Administered 2021-05-02: 250 mg via INTRAVENOUS
  Filled 2021-05-02: qty 20

## 2021-05-02 NOTE — Progress Notes (Signed)
°  Progress Note   Patient: Brenda [name] AOZ:[id number] DOB: 03-17-1961 DOA: 04/07/2021     24 DOS: the patient was seen and examined on 05/02/2021   Brief hospital course: 61 year old past medical history significant for stage IV lung cancer with recurrent falls who was recently discharged from hospital after being treated for metastatic lung cancer with brain mets, was brought into the hospital by her son who reported that she was aggressive, agitated and restless.  Patient had undergone radiation treatment and was discharged home on steroids. Patient's son wishes for patient to be placed, he is unable to take care of her at home.  Patient was evaluated by neurosurgery, patient refused surgical intervention for brain mets. Per Notes: APS  is not pursuing guardianship. Patient awaiting placement.  Assessment and Plan: Anemia of chronic disease Iron deficiency anemia. Iron level low at 25.  Hemoglobin slightly down to 7.6. Started  iron and B12 supplements Hb trending down. Repeat tomorrow.  Will give her IV iron today.   Stage 3b chronic kidney disease (CKD) (HCC) Baseline 2.5--2.7 Monitor renal function Cr down to 2.2  Chronic combined systolic and diastolic congestive heart failure (HCC) Compensated.  Continue with Coreg  Protein-calorie malnutrition, severe- (present on admission) Continue with nutrition supplements.  Acute encephalopathy- (present on admission) Patient was brought by son to the hospital because she was reported to be aggressive, agitated, restless and acting Evil.  -CT head scan: Show progressive enlargement of metastatic disease. -Neurosurgery  Consulted, patient did not want to proceed with surgical intervention. -Patient gets episode of agitation and restlessness, confusion on any steroids.  This medication has been discontinued. -APS not pursuing guardianship. -Stable.   Malignant neoplasm of right lung ([place])- (present on admission) Patient has a stage  IV metastatic lung cancer to brain, with left-sided weakness. Status post palliative radiation from 03/18/2021 until  04/01/2021. Radiation oncology and medical oncology were made aware of this admission. Continue with Keppra, OxyContin and gabapentin. Palliative care will  follow-up with family now that APS is not pursuing guardianship.   Alcohol abuse- (present on admission) History of alcohol substance abuse, no evidence of alcohol withdrawal.  She has received IV thiamine.        Subjective:  She denies pain. She is confuse.   Physical Exam: Vitals:   05/01/21 1315 05/01/21 2038 05/02/21 0500 05/02/21 0615  BP: 94/68 (!) 146/109  104/74  Pulse: 100 (!) 107  90  Resp: 18 17  17   Temp: 98.4 F (36.9 C) 98.3 F (36.8 C)  98.2 F (36.8 C)  TempSrc: Oral Oral  Oral  SpO2: 92% (!) 87%  97%  Weight:   50.2 kg   Height:       General; alert, no distress.  CVS; S 1, S 2 RRR Lung; CTA  Data Reviewed:  Bmet and CBD  Family Communication: No family at bedside.   Disposition: Status is: Inpatient Remains inpatient appropriate because: Awaiting safe discharge disposition.           Planned Discharge Destination: Skilled nursing facility     Time spent: 45 minutes  Author: Elmarie Shiley, MD 05/02/2021 2:44 PM  For on call review [email or web address].

## 2021-05-02 NOTE — TOC Progression Note (Addendum)
Transition of Care [facility]) - Progression Note    Patient Details  Name: [name] MRN: [id number] Date of Birth: [birth year]  Transition of Care [facility]) CM/SW Contact  Joaquin Courts, RN Phone Number: 05/02/2021, 3:13 PM  Clinical Narrative:    Referral sent to the [facility] in [facility].  No available LTC beds at [facility], [place], or [facility]. VM left for [facility] rep to follow up on yesterdays referral.     Barriers to Discharge: ED Facility/Family Refusing to Allow Patient to Return  Expected Discharge Plan and Services                                                 Social Determinants of Health (SDOH) Interventions    Readmission Risk Interventions Readmission Risk Prevention Plan 03/14/2021 07/17/2020 04/11/2020  Transportation Screening Complete Complete Complete  PCP or Specialist Appt within 3-5 Days - - Complete  HRI or [place] - Complete Complete  Social Work Consult for [place] Planning/Counseling - Complete Complete  Palliative Care Screening - Not Applicable Not Applicable  Medication Review Press photographer) Complete Complete Complete  PCP or Specialist appointment within 3-5 days of discharge Complete - -  [place] or Home Care Consult Complete - -  SW Recovery Care/Counseling Consult Complete - -  Palliative Care Screening Complete - -  [place] Patient Refused - -  Some recent data might be hidden

## 2021-05-03 NOTE — Progress Notes (Signed)
°  Progress Note   Patient: Brenda [name] ASN:[id number] DOB: 12-Apr-1960 DOA: 04/07/2021     25 DOS: the patient was seen and examined on 05/03/2021   Brief hospital course: 61 year old past medical history significant for stage IV lung cancer with recurrent falls who was recently discharged from hospital after being treated for metastatic lung cancer with brain mets, was brought into the hospital by her son who reported that she was aggressive, agitated and restless.  Patient had undergone radiation treatment and was discharged home on steroids. Patient's son wishes for patient to be placed, he is unable to take care of her at home.  Patient was evaluated by neurosurgery, patient refused surgical intervention for brain mets. Per Notes: APS  is not pursuing guardianship. Patient awaiting placement.  Assessment and Plan: Anemia of chronic disease Iron deficiency anemia. Iron level low at 25.  Hemoglobin slightly down to 7.6. Started  iron and B12 supplements Received IV iron 2/10. Repeat cbc tomorrow   Stage 3b chronic kidney disease (CKD) (HCC) Baseline 2.5--2.7 Monitor renal function Cr down to 2.2  Chronic combined systolic and diastolic congestive heart failure ([place]) Compensated.  Continue with Coreg  Protein-calorie malnutrition, severe- (present on admission) Continue with nutrition supplements.  Acute encephalopathy- (present on admission) Patient was brought by son to the hospital because she was reported to be aggressive, agitated, restless and acting Evil.  -CT head scan: Show progressive enlargement of metastatic disease. -Neurosurgery  Consulted, patient did not want to proceed with surgical intervention. -Patient gets episode of agitation and restlessness, confusion on any steroids.  This medication has been discontinued. -APS not pursuing guardianship. -Stable.   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 Status post palliative radiation from 03/18/2021 until  04/01/2021. Radiation oncology and medical oncology were made aware of this admission. Continue with Keppra, OxyContin and gabapentin. Palliative care will  follow-up with family now that APS is not pursuing guardianship.   Alcohol abuse- (present on admission) History of alcohol substance abuse, no evidence of alcohol withdrawal.  She has received IV thiamine.        Subjective: alert, confuse.   Physical Exam: Vitals:   05/03/21 0523 05/03/21 0525 05/03/21 0757 05/03/21 1323  BP: 100/62  99/67 94/68  Pulse: 98  95 92  Resp: 16  18 19   Temp: 98.3 F (36.8 C)  98 F (36.7 C) 97.8 F (36.6 C)  TempSrc: Oral  Oral Axillary  SpO2: 94%  100% 98%  Weight:  49.8 kg    Height:       General; alert, follows command Lung; CTA CVS; S 1 S 2 RRR   Data Reviewed:  There are no new results to review at this time.  Family Communication: No family at bedside.   Disposition: Status is: Inpatient Remains inpatient appropriate because: awaiting placement, safe discharge plan.           Planned Discharge Destination: Skilled nursing facility     Time spent: 45 minutes  Author: Elmarie Shiley, MD 05/03/2021 1:53 PM  For on call review [email or web address].

## 2021-05-03 NOTE — Progress Notes (Addendum)
Daily Progress Note   Patient Name: Brenda [name]       Date: 05/03/2021 DOB: 07/30/1960  Age: 61 y.o. MRN#: [id number] Attending Physician: Elmarie Shiley, MD Primary Care Physician: Pcp, No Admit Date: 04/07/2021  Reason for Consultation/Follow-up: Establishing goals of care  Subjective: I saw and examined Brenda [name].    She was lying in bed in no distress.  Had her gown pulled up over her head and stated she had a headache today.  I attempted to engage her in discussion about goals and the fact that she has incurable illness.  She stated her head hurt too much to think about such things today.  I also called and spoke with her niece, Brenda [name].  Brenda [name] reports understanding concern about the fact she has incurable illness that continues to progress but remains full code.  States that Brenda [name] has stated this is what she wants and family is wanting to honor her wishes but also knows that there is coming a point in time where death is inevitable and resuscitative attempts would not be medically appropriate.  Length of Stay: 25  Current Medications: Scheduled Meds:   carvedilol  3.125 mg Oral BID   Chlorhexidine Gluconate Cloth  6 each Topical Daily   feeding supplement  237 mL Oral BID BM   ferrous sulfate  325 mg Oral Q breakfast   folic acid  1 mg Oral Daily   levETIRAcetam  500 mg Oral BID   multivitamin with minerals  1 tablet Oral Daily   oxyCODONE  5 mg Oral Once   pantoprazole  40 mg Oral Daily   polyethylene glycol  17 g Oral BID   sodium chloride flush  10-40 mL Intracatheter Q12H   sodium chloride flush  3 mL Intravenous Q12H   thiamine  100 mg Oral Daily   vitamin B-12  500 mcg Oral Daily    Continuous Infusions:  sodium chloride      PRN Meds: sodium  chloride, acetaminophen, albuterol, alum & mag hydroxide-simeth, bisacodyl, hydrALAZINE, LORazepam, oxyCODONE, sodium chloride flush, sodium chloride flush  Physical Exam         Awake alert Denies pain Regular work of breathing  S 1 S 2  Abdomen not tender No edema  Vital Signs: BP 99/67 (BP Location: Right Arm)  Pulse 95    Temp 98 F (36.7 C) (Oral)    Resp 18    Ht 5\' 6"  (1.676 m)    Wt 49.8 kg    LMP 01/19/2011    SpO2 100%    BMI 17.72 kg/m  SpO2: SpO2: 100 % O2 Device: O2 Device: Room Air O2 Flow Rate: O2 Flow Rate (L/min): 2 L/min  Intake/output summary:  Intake/Output Summary (Last 24 hours) at 05/03/2021 2947 Last data filed at 05/03/2021 0530 Gross per 24 hour  Intake 550 ml  Output 800 ml  Net -250 ml    LBM: Last BM Date: 05/02/21 Baseline Weight: Weight: 49.2 kg Most recent weight: Weight: 49.8 kg       Palliative Assessment/Data:      Patient Active Problem List   Diagnosis Date Noted   Chronic combined systolic and diastolic congestive heart failure ([place]) 04/30/2021   Stage 3b chronic kidney disease (CKD) ([place]) 04/30/2021   Anemia of chronic disease 04/30/2021   Brain mass 04/08/2021   Protein-calorie malnutrition, severe 03/12/2021   Primary malignant neoplasm of lung with metastasis to brain ([place]) 03/11/2021   Exudative pleural effusion    Sinus tachycardia    Pleural effusion    S/P thoracentesis    Hypomagnesemia    Acute on chronic combined systolic and diastolic congestive heart failure (HCC)    Bacteremia associated with intravascular line ([place]) 07/03/2020   Acute blood loss anemia    Renal failure 07/01/2020   AKI (acute kidney injury) ([place])    Metabolic acidosis    Epistaxis    Seizure-like activity (HCC)    Swelling of lower extremity 05/29/2020   Hypoalbuminemia 05/29/2020   Acute on chronic anemia 04/07/2020   Gastrointestinal hemorrhage 04/07/2020   Acute encephalopathy 04/07/2020   Pancytopenia ([place]) 04/07/2020    Polysubstance abuse ([place]) 04/07/2020   Hypokalemia 10/19/2019   Neutropenia ([place]) 10/05/2019   Malignant neoplasm of right lung ([place]) 09/14/2019   Encounter for antineoplastic chemotherapy 09/14/2019   Encounter for antineoplastic immunotherapy 09/14/2019   Goals of care, counseling/discussion 09/14/2019   Tobacco abuse counseling 09/14/2019   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  Palliative medicine consulted for goals of care discussions.  Recommendations/Plan: I spoke again with Brenda [name] regarding her situation.  Overall, her condition will continue to decline.   Will continue discussions regarding goals of care/CODE STATUS as her death is not unexpected and it would be inappropriate to perform CPR or intubation in the event of cardiac or respiratory arrest.  This may need to be something driven by her care team as family feels conflicted as she has stated she would want these interventions but family also understands they we will not be beneficial in light of her incurable cancer.   Code Status:    Code Status Orders  (From admission, onward)           Start     Ordered   04/07/21 1759  Full code  Continuous  04/07/21 1802           Code Status History     Date Active Date Inactive Code Status Order ID Comments User Context   03/11/2021 1722 04/06/2021 0001 Full Code [id number]  Shawna Clamp, MD ED   08/09/2020 2007 08/17/2020 0041 Full Code [id number]   Elwyn Reach, MD Inpatient   07/01/2020 1433 08/03/2020 0414 Full Code [id number]  Gleason, Otilio Carpen, PA-C ED   04/07/2020 2042 04/11/2020 1909 Full Code [id number]  Howerter, Ethelda Chick, DO ED   04/03/2019 1507 04/05/2019 2210 Full Code [id number]  Lyndee Hensen, DO ED   05/30/2017 1558 06/03/2017 0006 Full Code [id number]  Patrecia Pour, NP Inpatient   05/29/2017 1508 05/30/2017 1525 Full Code [id number]  Isla Pence, MD ED   12/24/2011 0140 12/25/2011 2040 Full Code [id number]  Truddie Hidden., MD ED       Prognosis:  < 6 months  Discharge Planning: [facility] for rehab with Palliative care service follow-up  Care plan was discussed with  patient   Thank you for allowing the Palliative Medicine Team to assist in the care of this patient.  Micheline Rough, MD  Please contact Palliative Medicine Team phone at [phone] for questions and concerns.

## 2021-05-04 DIAGNOSIS — R4182 Altered mental status, unspecified: Secondary | ICD-10-CM

## 2021-05-04 LAB — CBC
HCT: 21.8 % — ABNORMAL LOW (ref 36.0–46.0)
Hemoglobin: 7 g/dL — ABNORMAL LOW (ref 12.0–15.0)
MCH: 33.3 pg (ref 26.0–34.0)
MCHC: 32.1 g/dL (ref 30.0–36.0)
MCV: 103.8 fL — ABNORMAL HIGH (ref 80.0–100.0)
Platelets: 242 10*3/uL (ref 150–400)
RBC: 2.1 MIL/uL — ABNORMAL LOW (ref 3.87–5.11)
RDW: 13 % (ref 11.5–15.5)
WBC: 5.1 10*3/uL (ref 4.0–10.5)
nRBC: 0 % (ref 0.0–0.2)

## 2021-05-04 NOTE — Progress Notes (Signed)
°  Progress Note   Patient: Brenda [name] SEG:[id number] DOB: [birth year] DOA: 04/07/2021     26 DOS: the patient was seen and examined on 05/04/2021   Brief hospital course: 61 year old past medical history significant for stage IV lung cancer with recurrent falls who was recently discharged from hospital after being treated for metastatic lung cancer with brain mets, was brought into the hospital by her son who reported that she was aggressive, agitated and restless.  Patient had undergone radiation treatment and was discharged home on steroids. Patient's son wishes for patient to be placed, he is unable to take care of her at home.  Patient was evaluated by neurosurgery, patient refused surgical intervention for brain mets. Per Notes: APS  is not pursuing guardianship. Patient awaiting placement.  Assessment and Plan: Anemia of chronic disease Iron deficiency anemia. Iron level low at 25.   Started  iron and B12 supplements Received IV iron 2/10. Hb at 7. Transfuse hb less than 7.   Stage 3b chronic kidney disease (CKD) (HCC) Baseline 2.5--2.7 Monitor renal function Cr down to 2.2  Chronic combined systolic and diastolic congestive heart failure (HCC) Compensated.  Continue with Coreg  Protein-calorie malnutrition, severe- (present on admission) Continue with nutrition supplements.  Acute encephalopathy- (present on admission) Patient was brought by son to the hospital because she was reported to be aggressive, agitated, restless and acting Evil.  -CT head scan: Show progressive enlargement of metastatic disease. -Neurosurgery  Consulted, patient did not want to proceed with surgical intervention. -Patient gets episode of agitation and restlessness, confusion on any steroids.  This medication has been discontinued. -APS not pursuing guardianship. -Remain stable.   Malignant neoplasm of right lung ([place])- (present on admission) Patient has a stage IV metastatic lung cancer to  brain, with left-sided weakness. Status post palliative radiation from 03/18/2021 until  04/01/2021. Radiation oncology and medical oncology were made aware of this admission. Continue with Keppra, OxyContin and gabapentin. Palliative care will  follow-up with family now that APS is not pursuing guardianship.   Alcohol abuse- (present on admission) History of alcohol substance abuse, no evidence of alcohol withdrawal.  She has received IV thiamine.        Subjective: alert, confuse.   Physical Exam: Vitals:   05/03/21 1323 05/03/21 2023 05/04/21 0457 05/04/21 1411  BP: 94/68 (!) 107/58 107/75 96/61  Pulse: 92 (!) 102 99 97  Resp: 19 18 16 17   Temp: 97.8 F (36.6 C) 99.5 F (37.5 C) 98.6 F (37 C) 98.3 F (36.8 C)  TempSrc: Axillary Oral Oral Oral  SpO2: 98% 95% 98% 95%  Weight:      Height:       General; NAD Lungs;; CTA Abdomen; soft, nt  Data Reviewed:  Cbc reviewed.   Family Communication: No family at bedside.  Disposition: Status is: Inpatient Remains inpatient appropriate because: awaiting placement.           Planned Discharge Destination: Home     Time spent: 45 minutes  Author: Elmarie Shiley, MD 05/04/2021 2:25 PM  For on call review [email or web address].

## 2021-05-05 LAB — PREPARE RBC (CROSSMATCH)

## 2021-05-05 LAB — CBC
HCT: 21.5 % — ABNORMAL LOW (ref 36.0–46.0)
Hemoglobin: 6.9 g/dL — CL (ref 12.0–15.0)
MCH: 33.3 pg (ref 26.0–34.0)
MCHC: 32.1 g/dL (ref 30.0–36.0)
MCV: 103.9 fL — ABNORMAL HIGH (ref 80.0–100.0)
Platelets: 253 10*3/uL (ref 150–400)
RBC: 2.07 MIL/uL — ABNORMAL LOW (ref 3.87–5.11)
RDW: 13 % (ref 11.5–15.5)
WBC: 5.6 10*3/uL (ref 4.0–10.5)
nRBC: 0 % (ref 0.0–0.2)

## 2021-05-05 LAB — HEMOGLOBIN AND HEMATOCRIT, BLOOD
HCT: 28.5 % — ABNORMAL LOW (ref 36.0–46.0)
Hemoglobin: 9.3 g/dL — ABNORMAL LOW (ref 12.0–15.0)

## 2021-05-05 MED ORDER — SODIUM CHLORIDE 0.9% IV SOLUTION: Freq: Once | INTRAVENOUS | Status: AC

## 2021-05-05 NOTE — Progress Notes (Signed)
°  Progress Note   Patient: Brenda [name] PYP:[id number] DOB: 03-20-1961 DOA: 04/07/2021     27 DOS: the patient was seen and examined on 05/05/2021   Brief hospital course: 61 year old past medical history significant for stage IV lung cancer with recurrent falls who was recently discharged from hospital after being treated for metastatic lung cancer with brain mets, was brought into the hospital by her son who reported that she was aggressive, agitated and restless.  Patient had undergone radiation treatment and was discharged home on steroids. Patient's son wishes for patient to be placed, he is unable to take care of her at home.  Patient was evaluated by neurosurgery, patient refused surgical intervention for brain mets. Per Notes: APS  is not pursuing guardianship. Patient awaiting placement. Hb down to 6.9. she will received one unit PRBC 2/13.  Assessment and Plan: Anemia of chronic disease Iron deficiency anemia. Iron level low at 25.   Started  iron and B12 supplements Received IV iron 2/10. Hb down to 6.9. plan to transfuse one unit PRBC> 2/13.  Stage 3b chronic kidney disease (CKD) (HCC) Baseline 2.5--2.7 Monitor renal function Cr down to 2.2  Chronic combined systolic and diastolic congestive heart failure (HCC) Compensated.  Continue with Coreg  Protein-calorie malnutrition, severe- (present on admission) Continue with nutrition supplements.  Acute encephalopathy- (present on admission) Patient was brought by son to the hospital because she was reported to be aggressive, agitated, restless and acting Evil.  -CT head scan: Show progressive enlargement of metastatic disease. -Neurosurgery  Consulted, patient did not want to proceed with surgical intervention. -Patient gets episode of agitation and restlessness, confusion on any steroids.  This medication has been discontinued. -APS not pursuing guardianship. -Remain stable.   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 Status post palliative radiation from 03/18/2021 until  04/01/2021. Radiation oncology and medical oncology were made aware of this admission. Continue with Keppra, OxyContin and gabapentin. Continue discussion with family for goals of care. See Dr Domingo Cocking note 2/12.   Alcohol abuse- (present on admission) History of alcohol substance abuse, no evidence of alcohol withdrawal.  She has received IV thiamine.        Subjective: Alert, confuse, no complaints.   Physical Exam: Vitals:   05/05/21 1125 05/05/21 1150 05/05/21 1326 05/05/21 1411  BP: 98/79 (!) 87/56 116/79 128/82  Pulse: 98 97 94 95  Resp: 16 14 16 16   Temp: 97.9 F (36.6 C) 98.4 F (36.9 C) (!) 97.5 F (36.4 C) 98.1 F (36.7 C)  TempSrc: Axillary  Oral Axillary  SpO2: 99%  100%   Weight:      Height:       General; NAD Lung: CTA Abdomen; Soft, Nt, ND  Data Reviewed:  Cbc reviewed.   Family Communication: No family at bedside.  Disposition: Status is: Inpatient Remains inpatient appropriate because: awaiting placement.           Planned Discharge Destination: Home     Time spent: 45 minutes  Author: Elmarie Shiley, MD 05/05/2021 2:45 PM  For on call review [email or web address].

## 2021-05-05 NOTE — Progress Notes (Cosign Needed)
Called patient's son, Daryel November, and niece, Melody Haver, and got consent for transfusing one unit of blood. Mardi Mainland, RN confirmed their consent.

## 2021-05-05 NOTE — TOC Progression Note (Signed)
Transition of Care [facility]) - Progression Note    Patient Details  Name: Brenda [name] MRN: [id number] Date of Birth: 08/06/1960  [facility]) CM/SW Contact  Joaquin Courts, RN Phone Number: 05/05/2021, 3:01 PM  Clinical Narrative:    CM followed up with referral to [facility] and [facility], both facilities have declined patient.    Referral faxed to [facility] for review.     Barriers to Discharge: ED Facility/Family Refusing to Allow Patient to Return  Expected Discharge Plan and Services                                                 Social Determinants of Health (SDOH) Interventions    Readmission Risk Interventions Readmission Risk Prevention Plan 03/14/2021 07/17/2020 04/11/2020  Transportation Screening Complete Complete Complete  PCP or Specialist Appt within 3-5 Days - - Complete  HRI or [place] - Complete Complete  Social Work Consult for [place] Planning/Counseling - Complete Complete  Palliative Care Screening - Not Applicable Not Applicable  Medication Review Press photographer) Complete Complete Complete  PCP or Specialist appointment within 3-5 days of discharge Complete - -  [place] or Home Care Consult Complete - -  SW Recovery Care/Counseling Consult Complete - -  Palliative Care Screening Complete - -  [place] Patient Refused - -  Some recent data might be hidden

## 2021-05-05 NOTE — Progress Notes (Signed)
Date and time results received: 05/05/21 0541 (use smartphrase ".now" to insert current time)  Test: Hgb Critical Value: 6.9  Name of Provider Notified: Lovey Newcomer NP  Orders Received? Or Actions Taken?: none received at this time. Waiting response

## 2021-05-06 LAB — TYPE AND SCREEN
ABO/RH(D): O POS
Antibody Screen: POSITIVE
Donor AG Type: NEGATIVE
Unit division: 0

## 2021-05-06 LAB — BPAM RBC
Blood Product Expiration Date: 202303142359
ISSUE DATE / TIME: 202302131124
Unit Type and Rh: 9500

## 2021-05-06 NOTE — TOC Progression Note (Signed)
Transition of Care [facility]) - Progression Note    Patient Details  Name: Brenda [name] MRN: [id number] Date of Birth: September 20, 1960  Transition of Care [facility]) CM/SW Contact  Joaquin Courts, RN Phone Number: 05/06/2021, 2:54 PM  Clinical Narrative:    [facility] have declined patient.  Spoke with rep for [facility]- no open LTC beds now or in the near future.  VM left for admissions at [facility].  Spoke with [employer], who states she is working with her corporate to see if patient is a candidate but will need patients financial information from the last 3 months to ensure patients medicaid will convert to LTC.      Barriers to Discharge: ED Facility/Family Refusing to Allow Patient to Return  Expected Discharge Plan and Services                                                 Social Determinants of Health (SDOH) Interventions    Readmission Risk Interventions Readmission Risk Prevention Plan 03/14/2021 07/17/2020 04/11/2020  Transportation Screening Complete Complete Complete  PCP or Specialist Appt within 3-5 Days - - Complete  HRI or [place] - Complete Complete  Social Work Consult for [place] Planning/Counseling - Complete Complete  Palliative Care Screening - Not Applicable Not Applicable  Medication Review Press photographer) Complete Complete Complete  PCP or Specialist appointment within 3-5 days of discharge Complete - -  [place] or Home Care Consult Complete - -  SW Recovery Care/Counseling Consult Complete - -  Palliative Care Screening Complete - -  [place] Patient Refused - -  Some recent data might be hidden

## 2021-05-06 NOTE — Progress Notes (Addendum)
°  Progress Note   Patient: Brenda [name] [id number] DOB: [birth year] DOA: 04/07/2021     28 DOS: the patient was seen and examined on 05/06/2021   Brief hospital course: 61 year old past medical history significant for stage IV lung cancer with recurrent falls who was recently discharged from hospital after being treated for metastatic lung cancer with brain mets, was brought into the hospital by her son who reported that she was aggressive, agitated and restless.  Patient had undergone radiation treatment and was discharged home on steroids. Patient's son wishes for patient to be placed, he is unable to take care of her at home.  Patient was evaluated by neurosurgery, patient refused surgical intervention for brain mets. Per Notes: APS  is not pursuing guardianship. Patient awaiting placement. Hb down to 6.9. she will received one unit PRBC 2/13. Hb stable.   Assessment and Plan: Anemia of chronic disease Iron deficiency anemia. Iron level low at 25.   Started  iron and B12 supplements Received IV iron 2/10. Hb down to 6.9. plan to transfuse one unit PRBC> 2/13. Hb post transfusion 9.3. repeat labs tomorrow.   Stage 3b chronic kidney disease (CKD) (HCC) Baseline 2.5--2.7 Monitor renal function Cr down to 2.2  Chronic combined systolic and diastolic congestive heart failure (HCC) Compensated.  Continue with Coreg  Protein-calorie malnutrition, severe- (present on admission) Continue with nutrition supplements.  Acute encephalopathy- (present on admission) Patient was brought by son to the hospital because she was reported to be aggressive, agitated, restless and acting Evil.  -CT head scan: Show progressive enlargement of metastatic disease. -Neurosurgery  Consulted, patient did not want to proceed with surgical intervention. -Patient gets episode of agitation and restlessness, confusion on any steroids.  This medication has been discontinued. -APS not pursuing  guardianship. -Remain stable.   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 Status post palliative radiation from 03/18/2021 until  04/01/2021. Radiation oncology and medical oncology were made aware of this admission. Continue with Keppra, OxyContin and gabapentin. Continue discussion with family for goals of care. See Dr Domingo Cocking note 2/12.   Alcohol abuse- (present on admission) History of alcohol substance abuse, no evidence of alcohol withdrawal.  She has received IV thiamine.        Subjective: Alert, confuse. No new complaints.   Physical Exam: Vitals:   05/05/21 1949 05/05/21 2323 05/06/21 0451 05/06/21 0626  BP: 102/73 90/75  111/72  Pulse: (!) 106 97  98  Resp: 16   16  Temp: 99.3 F (37.4 C)   97.8 F (36.6 C)  TempSrc: Oral   Oral  SpO2: 98%   99%  Weight:   49.5 kg   Height:       General; NAD Lung: CTA Abdomen; Soft, nt, nd  Data Reviewed:  Cbc reviewed.   Family Communication: No family at bedside.  Disposition: Status is: Inpatient Remains inpatient appropriate because: awaiting placement.           Planned Discharge Destination: SNF, awaiting placement, difficult to place.      Time spent: 45 minutes  Author: Elmarie Shiley, MD 05/06/2021 1:58 PM  For on call review [email or web address].

## 2021-05-06 NOTE — Progress Notes (Signed)
Nutrition Follow-up  DOCUMENTATION CODES:   Underweight, Severe malnutrition in context of chronic illness  INTERVENTION:   -Ensure Plus High Protein po BID, each supplement provides 350 kcal and 20 grams of protein.   -Magic cup BID with meals, each supplement provides 290 kcal and 9 grams of protein   -Multivitamin with minerals daily  NUTRITION DIAGNOSIS:   Severe Malnutrition related to chronic illness, cancer and cancer related treatments as evidenced by energy intake < or equal to 75% for > or equal to 1 month, mild fat depletion, severe muscle depletion.  Ongoing  GOAL:   Patient will meet greater than or equal to 90% of their needs  Progressing.  MONITOR:   PO intake, Supplement acceptance, Labs, Weight trends, I & O's  ASSESSMENT:   61 y.o. female with past medical history of stage IV lung cancer with recurrent falls who was recently discharged from the hospital after being treated for metastatic lung cancer with brain mets.  Patient currently consuming 15-40% of meals at this time. She is alert/oriented x 1. Accepting Ensure supplements. Will continue to monitor plan.  Admission weight: 108 lbs Current weight: 109 lbs  Medications: Ferrous sulfate, Folic acid, Multivitamin with minerals daily, Miralax, Thiamine, Vitamin B-12  Labs reviewed.  Diet Order:   Diet Order             DIET DYS 3 Room service appropriate? Yes; Fluid consistency: Thin  Diet effective now                   EDUCATION NEEDS:   No education needs have been identified at this time  Skin:  Skin Assessment: Reviewed RN Assessment  Last BM:  2/13 -type 6  Height:   Ht Readings from Last 1 Encounters:  04/09/21 5\' 6"  (1.676 m)    Weight:   Wt Readings from Last 1 Encounters:  05/06/21 49.5 kg    BMI:  Body mass index is 17.61 kg/m.  Estimated Nutritional Needs:   Kcal:  2000-2200  Protein:  90-100g  Fluid:  2L/day  Clayton Bibles, MS, RD, LDN Inpatient  Clinical Dietitian Contact information available via Amion

## 2021-05-07 LAB — BASIC METABOLIC PANEL
Anion gap: 8 (ref 5–15)
BUN: 34 mg/dL — ABNORMAL HIGH (ref 6–20)
CO2: 27 mmol/L (ref 22–32)
Calcium: 10.5 mg/dL — ABNORMAL HIGH (ref 8.9–10.3)
Chloride: 100 mmol/L (ref 98–111)
Creatinine, Ser: 2.62 mg/dL — ABNORMAL HIGH (ref 0.44–1.00)
GFR, Estimated: 21 mL/min — ABNORMAL LOW (ref >60)
Glucose, Bld: 103 mg/dL — ABNORMAL HIGH (ref 70–99)
Potassium: 4.1 mmol/L (ref 3.5–5.1)
Sodium: 135 mmol/L (ref 135–145)

## 2021-05-07 LAB — CBC
HCT: 28 % — ABNORMAL LOW (ref 36.0–46.0)
Hemoglobin: 9.1 g/dL — ABNORMAL LOW (ref 12.0–15.0)
MCH: 32.5 pg (ref 26.0–34.0)
MCHC: 32.5 g/dL (ref 30.0–36.0)
MCV: 100 fL (ref 80.0–100.0)
Platelets: 286 10*3/uL (ref 150–400)
RBC: 2.8 MIL/uL — ABNORMAL LOW (ref 3.87–5.11)
RDW: 14.3 % (ref 11.5–15.5)
WBC: 6.3 10*3/uL (ref 4.0–10.5)
nRBC: 0 % (ref 0.0–0.2)

## 2021-05-07 NOTE — Progress Notes (Addendum)
°  Progress Note   Patient: Brenda [name] VVO:[id number] DOB: 06-Oct-1960 DOA: 04/07/2021     29 DOS: the patient was seen and examined on 05/07/2021   Brief hospital course: 61 year old past medical history significant for stage IV lung cancer with recurrent falls who was recently discharged from hospital after being treated for metastatic lung cancer with brain mets, was brought into the hospital by her son who reported that she was aggressive, agitated and restless.  Patient had undergone radiation treatment and was discharged home on steroids. Patient's son wishes for patient to be placed, he is unable to take care of her at home.  Patient was evaluated by neurosurgery, patient refused surgical intervention for brain mets. Per Notes: APS  is not pursuing guardianship. Patient awaiting placement. Hb down to 6.9. she will received one unit PRBC 2/13. Hb stable.   Assessment and Plan: Anemia of chronic disease Iron deficiency anemia. Iron level low at 25.   Started  iron and B12 supplements Received IV iron 2/10. Hb down to 6.9. plan to transfuse one unit PRBC> 2/13. Hb post transfusion 9.3. repeat labs hemoglobin stable  Stage 3b chronic kidney disease (CKD) (HCC) Baseline 2.5--2.7 Monitor renal function Cr down to 2.2  Chronic combined systolic and diastolic congestive heart failure ([place]) Compensated.  Continue with Coreg  Protein-calorie malnutrition, severe- (present on admission) Continue with nutrition supplements.  Acute encephalopathy- (present on admission) Patient was brought by son to the hospital because she was reported to be aggressive, agitated, restless and acting Evil.  -CT head scan: Show progressive enlargement of metastatic disease. -Neurosurgery  Consulted, patient did not want to proceed with surgical intervention. -Patient gets episode of agitation and restlessness, confusion on any steroids.  This medication has been discontinued. -APS not pursuing  guardianship. -Remain stable.   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 Status post palliative radiation from 03/18/2021 until  04/01/2021. Radiation oncology and medical oncology were made aware of this admission. Continue with Keppra, OxyContin and gabapentin. Continue discussion with family for goals of care. See Dr Domingo Cocking note 2/12.   Alcohol abuse- (present on admission) History of alcohol substance abuse, no evidence of alcohol withdrawal.  She has received IV thiamine.        Subjective: Son at the bedside report he and his mother chatted to midnight early this morning due to being Valentine's Day yesterday  This morning patient is alert and remain confused  Physical Exam: Vitals:   05/06/21 2009 05/07/21 0511 05/07/21 0751 05/07/21 1322  BP: 116/69 120/89 (!) 113/54 96/68  Pulse: 97 99 100 97  Resp: 18 18 18 17   Temp: 98.7 F (37.1 C) 98 F (36.7 C) 98.2 F (36.8 C) 98.1 F (36.7 C)  TempSrc: Oral Oral Axillary Oral  SpO2: 100% 97% 98% 93%  Weight:      Height:       General; frail, weak, appears confused Lung: CTA Abdomen; Soft, nt, nd  Data Reviewed:  Cbc reviewed.   Family Communication: son at bedside.  Disposition: Status is: Inpatient Remains inpatient appropriate because: awaiting placement.           Planned Discharge Destination: SNF, awaiting placement, difficult to place.        Author: Florencia Reasons, MD PhD FACP 05/07/2021 7:10 PM  For on call review [email or web address].

## 2021-05-07 NOTE — TOC Progression Note (Signed)
Transition of Care [facility]) - Progression Note    Patient Details  Name: Brenda [name] MRN: [id number] Date of Birth: 11-07-1960  Transition of Care [facility]) CM/SW Contact  Joaquin Courts, RN Phone Number: 05/07/2021, 11:42 AM  Clinical Narrative:    CM followed up with [facility], [facility], and [facility]- facilities report they have no open LTC beds.  [facility] rehab contacted and report they have no LTC beds.  [facility].     Barriers to Discharge: ED Facility/Family Refusing to Allow Patient to Return  Expected Discharge Plan and Services                                                 Social Determinants of Health (SDOH) Interventions    Readmission Risk Interventions Readmission Risk Prevention Plan 03/14/2021 07/17/2020 04/11/2020  Transportation Screening Complete Complete Complete  PCP or Specialist Appt within 3-5 Days - - Complete  HRI or [place] - Complete Complete  Social Work Consult for [place] Planning/Counseling - Complete Complete  Palliative Care Screening - Not Applicable Not Applicable  Medication Review Press photographer) Complete Complete Complete  PCP or Specialist appointment within 3-5 days of discharge Complete - -  [place] or Home Care Consult Complete - -  SW Recovery Care/Counseling Consult Complete - -  Palliative Care Screening Complete - -  [place] Patient Refused - -  Some recent data might be hidden

## 2021-05-07 NOTE — TOC Progression Note (Signed)
Transition of Care [facility]) - Progression Note    Patient Details  Name: Brenda [name] MRN: [id number] Date of Birth: [birth year]  Transition [facility]) CM/SW Contact  Chanler Schreiter, Marjie Skiff, RN Phone Number: 05/07/2021, 2:47 PM  Clinical Narrative:     The following SNF facilities have been contacted for LTC bed: [facility]-message left [facility]-message left [facility]-sent info again, liaison looking at her [facility] beds [facility] left [facility] left.  TOC will continue to follow.    Barriers to Discharge: ED Facility/Family Refusing to Allow Patient to Return   Readmission Risk Interventions Readmission Risk Prevention Plan 03/14/2021 07/17/2020 04/11/2020  Transportation Screening Complete Complete Complete  PCP or Specialist Appt within 3-5 Days - - Complete  HRI or [place] - Complete Complete  Social Work Consult for [place] Planning/Counseling - Complete Complete  Palliative Care Screening - Not Applicable Not Applicable  Medication Review Press photographer) Complete Complete Complete  PCP or Specialist appointment within 3-5 days of discharge Complete - -  [place] or Home Care Consult Complete - -  SW Recovery Care/Counseling Consult Complete - -  Palliative Care Screening Complete - -  [place] Patient Refused - -  Some recent data might be hidden

## 2021-05-08 MED ORDER — LEVETIRACETAM 100 MG/ML PO SOLN
500.0000 mg | Freq: Two times a day (BID) | ORAL | Status: DC
  Administered 2021-05-08 – 2021-05-10 (×5): 500 mg via ORAL
  Filled 2021-05-08 (×6): qty 5

## 2021-05-08 NOTE — Progress Notes (Signed)
°  Progress Note   Patient: Brenda [name] PRF:[id number] DOB: [birth year] DOA: 04/07/2021     30 DOS: the patient was seen and examined on 05/08/2021   Brief hospital course: 61 year old past medical history significant for stage IV lung cancer with recurrent falls who was recently discharged from hospital after being treated for metastatic lung cancer with brain mets, was brought into the hospital by her son who reported that she was aggressive, agitated and restless.  Patient had undergone radiation treatment and was discharged home on steroids. Patient's son wishes for patient to be placed, he is unable to take care of her at home.  Patient was evaluated by neurosurgery, patient refused surgical intervention for brain mets. Per Notes: APS  is not pursuing guardianship. Patient awaiting placement. Hb down to 6.9. she will received one unit PRBC 2/13. Hb stable.   Assessment and Plan: Anemia of chronic disease Iron deficiency anemia. Iron level low at 25.   Started  iron and B12 supplements Received IV iron 2/10. Hb down to 6.9. plan to transfuse one unit PRBC> 2/13. Hb post transfusion 9.3. repeat labs hemoglobin stable  Stage 3b chronic kidney disease (CKD) (HCC) Baseline 2.5--2.7 Monitor renal function Cr down to 2.2  Chronic combined systolic and diastolic congestive heart failure ([place]) Compensated.  Continue with Coreg  Protein-calorie malnutrition, severe- (present on admission) Continue with nutrition supplements.  Acute encephalopathy- (present on admission) Patient was brought by son to the hospital because she was reported to be aggressive, agitated, restless and acting Evil.  -CT head scan: Show progressive enlargement of metastatic disease. -Neurosurgery  Consulted, patient did not want to proceed with surgical intervention. -Patient gets episode of agitation and restlessness, confusion on any steroids.  This medication has been discontinued. -APS not pursuing  guardianship. -Remain stable.   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 Status post palliative radiation from 03/18/2021 until  04/01/2021. Radiation oncology and medical oncology were made aware of this admission. Continue with Keppra, OxyContin and gabapentin. Continue discussion with family for goals of care. See Dr Domingo Cocking note 2/12.   Alcohol abuse- (present on admission) History of alcohol substance abuse, no evidence of alcohol withdrawal.  She has received IV thiamine.        Subjective: No interval changes , awaiting for disposition  son at the bedside , no new questions today    Physical Exam: Vitals:   05/07/21 1322 05/07/21 2017 05/08/21 0626 05/08/21 1430  BP: 96/68 106/70 100/76 (!) 113/91  Pulse: 97 95 96 96  Resp: 17 18 18 15   Temp: 98.1 F (36.7 C) 98.3 F (36.8 C) 98.2 F (36.8 C) 98 F (36.7 C)  TempSrc: Oral Oral Oral   SpO2: 93% 96% 99%   Weight:      Height:       General; frail, weak, appears confused Lung: CTA Abdomen; Soft, nt, nd  Data Reviewed:  Cbc reviewed.   Family Communication: son at bedside.  Disposition: Status is: Inpatient Remains inpatient appropriate because: awaiting placement.           Planned Discharge Destination: SNF, awaiting placement, difficult to place.        Author: Florencia Reasons, MD PhD FACP 05/08/2021 6:49 PM  For on call review [email or web address].

## 2021-05-08 NOTE — TOC Progression Note (Addendum)
Transition of Care [facility]) - Progression Note    Patient Details  Name: Brenda [name] MRN: [id number] Date of Birth: 08-15-1960  [facility]) CM/SW Contact  Joaquin Courts, RN Phone Number: 05/08/2021, 12:35 PM  Clinical Narrative:    CM contacted [facility]- referral faxed for review.  [facility]- VM left  [facility]- VM left for regional director Alphonsus Sias years in [place]- no open LTC beds.      Barriers to Discharge: ED Facility/Family Refusing to Allow Patient to Return  Expected Discharge Plan and Services                                                 Social Determinants of Health (SDOH) Interventions    Readmission Risk Interventions Readmission Risk Prevention Plan 03/14/2021 07/17/2020 04/11/2020  Transportation Screening Complete Complete Complete  PCP or Specialist Appt within 3-5 Days - - Complete  HRI or [place] - Complete Complete  Social Work Consult for [place] Planning/Counseling - Complete Complete  Palliative Care Screening - Not Applicable Not Applicable  Medication Review Press photographer) Complete Complete Complete  PCP or Specialist appointment within 3-5 days of discharge Complete - -  [place] or Home Care Consult Complete - -  SW Recovery Care/Counseling Consult Complete - -  Palliative Care Screening Complete - -  [place] Patient Refused - -  Some recent data might be hidden

## 2021-05-09 NOTE — Progress Notes (Signed)
°  Progress Note   Patient: Brenda [name] WCB:[id number] DOB: 01-Jun-1960 DOA: 04/07/2021     31 DOS: the patient was seen and examined on 05/09/2021   Brief hospital course: 61 year old past medical history significant for stage IV lung cancer with recurrent falls who was recently discharged from hospital after being treated for metastatic lung cancer with brain mets, was brought into the hospital by her son who reported that she was aggressive, agitated and restless.  Patient had undergone radiation treatment and was discharged home on steroids. Patient's son wishes for patient to be placed, he is unable to take care of her at home.  Patient was evaluated by neurosurgery, patient refused surgical intervention for brain mets. Per Notes: APS  is not pursuing guardianship. Patient awaiting placement. Hb down to 6.9. she will received one unit PRBC 2/13. Hb stable.   Assessment and Plan: Anemia of chronic disease Iron deficiency anemia. Iron level low at 25.   Started  iron and B12 supplements Received IV iron 2/10. Hb down to 6.9. plan to transfuse one unit PRBC> 2/13. Hb post transfusion 9.3. repeat labs hemoglobin stable  Stage 3b chronic kidney disease (CKD) (HCC) Baseline 2.5--2.7 Monitor renal function Cr down to 2.2  Chronic combined systolic and diastolic congestive heart failure ([place]) Compensated.  Continue with Coreg  Protein-calorie malnutrition, severe- (present on admission) Continue with nutrition supplements.  Acute encephalopathy- (present on admission) Patient was brought by son to the hospital because she was reported to be aggressive, agitated, restless and acting Evil.  -CT head scan: Show progressive enlargement of metastatic disease. -Neurosurgery  Consulted, patient did not want to proceed with surgical intervention. -Patient gets episode of agitation and restlessness, confusion on any steroids.  This medication has been discontinued. -APS not pursuing  guardianship. -Remain stable.   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 Status post palliative radiation from 03/18/2021 until  04/01/2021. Radiation oncology and medical oncology were made aware of this admission. Continue with Keppra, OxyContin and gabapentin. Continue discussion with family for goals of care. See Dr Domingo Cocking note 2/12.   Alcohol abuse- (present on admission) History of alcohol substance abuse, no evidence of alcohol withdrawal.  She has received IV thiamine.        Subjective: No interval changes , awaiting for disposition  She is alert, pleasantly confused son is not at bedside    Physical Exam: Vitals:   05/08/21 1943 05/09/21 0502 05/09/21 1051 05/09/21 1324  BP: 97/67 109/70 102/68 107/73  Pulse: 97 93 (!) 104 96  Resp: 16 18  18   Temp: 99.4 F (37.4 C) 98.1 F (36.7 C)  98 F (36.7 C)  TempSrc: Oral Oral  Oral  SpO2: 92% 97%  99%  Weight:  45.7 kg    Height:       General; frail, weak, appears confused Lung: CTA Abdomen; Soft, nt, nd  Data Reviewed:  Cbc reviewed.   Family Communication: son at bedside.  Disposition: Status is: Inpatient Remains inpatient appropriate because: awaiting placement.           Planned Discharge Destination: SNF, awaiting placement, difficult to place.        Author: Florencia Reasons, MD PhD FACP 05/09/2021 6:16 PM  For on call review [email or web address].

## 2021-05-09 NOTE — TOC Progression Note (Signed)
Transition of Care [facility]) - Progression Note    Patient Details  Name: Brenda [name] MRN: [id number] Date of Birth: 28-Aug-1960  Transition of Care [facility]) CM/SW Contact  Madinah Quarry, Marjie Skiff, RN Phone Number: 05/09/2021, 1:18 PM  Clinical Narrative:    SNF bed offer received from Dyane Dustman at [facility]. [facility] states that pt family must provide pt's February SSI check and prepay for March for her. Family must be willing to sign her in as well.   Niece [name] contacted to give information. She agrees to provide payment for facility and to sign pt in. She was given [facility] contact information to sign admission paperwork.   Spoke with APS CSW Marsh Dolly about above information. She was encouraged to follow up with [name] to make sure she follows through with what is needed to get pt admitted to SNF. TOC will continue to follow.     Barriers to Discharge: ED Facility/Family Refusing to Allow Patient to Return  Readmission Risk Interventions Readmission Risk Prevention Plan 03/14/2021 07/17/2020 04/11/2020  Transportation Screening Complete Complete Complete  PCP or Specialist Appt within 3-5 Days - - Complete  HRI or [place] - Complete Complete  Social Work Consult for [place] Planning/Counseling - Complete Complete  Palliative Care Screening - Not Applicable Not Applicable  Medication Review Press photographer) Complete Complete Complete  PCP or Specialist appointment within 3-5 days of discharge Complete - -  [place] or Home Care Consult Complete - -  SW Recovery Care/Counseling Consult Complete - -  Palliative Care Screening Complete - -  [place] Patient Refused - -  Some recent data might be hidden

## 2021-05-10 DIAGNOSIS — D638 Anemia in other chronic diseases classified elsewhere: Secondary | ICD-10-CM

## 2021-05-10 LAB — RESP PANEL BY RT-PCR (FLU A&B, COVID) ARPGX2
Influenza A by PCR: NEGATIVE
Influenza B by PCR: NEGATIVE
SARS Coronavirus 2 by RT PCR: NEGATIVE

## 2021-05-10 MED ORDER — CARVEDILOL 3.125 MG PO TABS: 3.1250 mg | ORAL_TABLET | Freq: Two times a day (BID) | ORAL | Status: DC

## 2021-05-10 MED ORDER — FOLIC ACID 1 MG PO TABS: ORAL_TABLET | Freq: Every day | ORAL | 2 refills | Status: DC

## 2021-05-10 MED ORDER — BISACODYL 10 MG RE SUPP: 10.0000 mg | Freq: Every day | RECTAL | 0 refills | Status: DC | PRN

## 2021-05-10 MED ORDER — LORAZEPAM 0.5 MG PO TABS: 0.5000 mg | ORAL_TABLET | Freq: Four times a day (QID) | ORAL | 0 refills | Status: DC | PRN

## 2021-05-10 MED ORDER — FERROUS SULFATE 325 (65 FE) MG PO TABS: 325.0000 mg | ORAL_TABLET | Freq: Every day | ORAL | 3 refills | Status: DC

## 2021-05-10 MED ORDER — ADULT MULTIVITAMIN W/MINERALS CH: 1.0000 | ORAL_TABLET | Freq: Every day | ORAL | Status: DC

## 2021-05-10 MED ORDER — CYANOCOBALAMIN 500 MCG PO TABS: 500.0000 ug | ORAL_TABLET | Freq: Every day | ORAL | Status: DC

## 2021-05-10 MED ORDER — LEVETIRACETAM 100 MG/ML PO SOLN: 500.0000 mg | Freq: Two times a day (BID) | ORAL | 12 refills | Status: DC

## 2021-05-10 MED ORDER — OXYCODONE HCL 5 MG/5ML PO SOLN: 5.0000 mg | ORAL | 0 refills | Status: DC | PRN

## 2021-05-10 MED ORDER — PANTOPRAZOLE SODIUM 40 MG PO TBEC: 40.0000 mg | DELAYED_RELEASE_TABLET | Freq: Every day | ORAL | Status: DC

## 2021-05-10 MED ORDER — ENSURE ENLIVE PO LIQD: 237.0000 mL | Freq: Two times a day (BID) | ORAL | 12 refills | Status: DC

## 2021-05-10 NOTE — Discharge Summary (Signed)
Discharge Summary  Brenda [name] WFU:[id number] DOB: 09-05-1960  PCP: Pcp, No  Admit date: 04/07/2021 Discharge date: 05/10/2021  Time spent: 59mins, more than 50% time spent on coordination of care.  Recommendations for Outpatient Follow-up:  F/u with SNF MD for hospital discharge follow up, repeat cbc/bmp at follow up Palliative care to close follow patient at SNF    Discharge Diagnoses:  Active Hospital Problems   Diagnosis Date Noted   Chronic combined systolic and diastolic congestive heart failure (HCC) 04/30/2021   Stage 3b chronic kidney disease (CKD) ([place]) 04/30/2021   Anemia of chronic disease 04/30/2021   Brain mass 04/08/2021   Protein-calorie malnutrition, severe 03/12/2021   Acute encephalopathy 04/07/2020   Malignant neoplasm of right lung ([place]) 09/14/2019   Alcohol abuse     Resolved Hospital Problems   Diagnosis Date Noted Date Resolved   Altered mental status 04/07/2021 04/30/2021    Discharge Condition: stable  Diet recommendation: dysphagia 3 diet   Filed Weights   05/06/21 0451 05/09/21 0502 05/10/21 0526  Weight: 49.5 kg 45.7 kg 44.4 kg    History of present illness: ( per admitting Md dr Louanne Belton) Chief Complaint: Altered mental status   HPI: Brenda [name] is a 61 y.o. female with past medical history of stage IV lung cancer with recurrent falls who was recently discharged from the hospital after being treated for metastatic lung cancer with brain mets.  She was discharged home but then at home she was aggressive, agitated, restless and acting evil and out of it at times as per the son.  She was also talking irrelevant things and looking at picture at the wall. Son states that she got mean after taking dexamethasone around 30 mins after it. No fever reported. No nausea, vomiting, fever or chills.  Patient is being cared by her son at home who could not handle her situation and the patient was brought into the hospital.  In the previous admission  patient had declined brain surgery and was treated with radiation and Decadron during the hospital stay.    ED Course: In the ED, patient patient was very agitated and was difficult to obtain  blood sample.  Received Ativan x1 for blood sample.  Oncology was consulted from the ED who recommended palliative care.  Hospital Course:  Active Problems:   Alcohol abuse   Malignant neoplasm of right lung (HCC)   Acute encephalopathy   Protein-calorie malnutrition, severe   Brain mass   Chronic combined systolic and diastolic congestive heart failure (HCC)   Stage 3b chronic kidney disease (CKD) (HCC)   Anemia of chronic disease   Assessment and Plan:  Acute encephalopathy- (present on admission) Patient was brought by son to the hospital because she was reported to be aggressive, agitated, restless and acting Evil.  -CT head scan: Show progressive enlargement of metastatic disease. -Neurosurgery  Consulted, patient did not want to proceed with surgical intervention. -Patient gets episode of agitation and restlessness, confusion on any steroids.  This medication has been discontinued. 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 Status post palliative radiation from 03/18/2021 until  04/01/2021. Radiation oncology and medical oncology  aware of this admission and agreed with palliative care approach  Seen by palliative care, please See Dr Domingo Cocking  note 2/12. Continue with Keppra, OxyContin and gabapentin.  APS  pursuing guardianship, palliative care to continue follow patient at snf   Chronic combined systolic and diastolic congestive heart failure ([place]) Compensated.  Continue with low dose Coreg if blood pressure is able to tolerate  APS   pursuing guardianship, palliative care to continue follow patient at snf  Anemia of chronic disease/Iron deficiency anemia. Received IV iron 2/10.  one unit PRBC on 2/13. Hb post transfusion 9.3. repeat labs hemoglobin stable No overt sign of bleeding  Continue iron supplement   APS  pursuing guardianship, palliative care to continue follow patient at snf  Stage 3b chronic kidney disease (CKD) (HCC) Baseline 2.5--2.7 Renal dosing meds     Protein-calorie malnutrition, severe- (present on admission) Continue with nutrition supplements.     Alcohol abuse- (present on admission) History of alcohol substance abuse, no evidence of alcohol withdrawal.  She has received IV thiamine.      FTT, poor prognosis, APS  pursuing guardianship, palliative care to continue follow patient at snf   Discharge Exam: BP 111/71 (BP Location: Right Arm)    Pulse 98    Temp 98.5 F (36.9 C) (Oral)    Resp 16    Ht 5\' 6"  (1.676 m)    Wt 44.4 kg    LMP 01/19/2011    SpO2 98%    BMI 15.80 kg/m   General: pleasantly confused, no agitation,  Cardiovascular: RRR Respiratory: normal respiratory effort     Discharge Instructions     Diet general   Complete by: As directed    Dysphagia 3 diet, thin liquid   Increase activity slowly   Complete by: As directed       Allergies as of 05/10/2021       Reactions   Aspirin Adult Low [aspirin] Other (See Comments)   Stomach upset        Medication List     STOP taking these medications    dexamethasone 4 MG tablet Commonly known as: DECADRON   levETIRAcetam 500 MG tablet Commonly known as: Keppra Replaced by: levETIRAcetam 100 MG/ML solution       TAKE these medications    acetaminophen 325 MG tablet Commonly known as: TYLENOL Take 2 tablets (650 mg total) by mouth every 6 (six) hours as needed for mild pain, moderate pain or headache.   bisacodyl 10 MG suppository Commonly known as: DULCOLAX Place 1 suppository (10 mg total)  rectally daily as needed for moderate constipation.   carvedilol 3.125 MG tablet Commonly known as: COREG Take 1 tablet (3.125 mg total) by mouth 2 (two) times daily. What changed:  medication strength how much to take   feeding supplement Liqd Take 237 mLs by mouth 2 (two) times daily between meals.   ferrous sulfate 325 (65 FE) MG tablet Take 1 tablet (325 mg total) by mouth daily with breakfast. Start taking on: May 11, 2021 What changed:  how much to take how to take this when to take this   folic acid 1 MG tablet Commonly known as: FOLVITE TAKE 1 TABLET (1 MG TOTAL) BY MOUTH DAILY.   gabapentin 300 MG capsule Commonly known as: NEURONTIN Take 1 capsule (300 mg total) by mouth at bedtime.   levETIRAcetam 100 MG/ML solution Commonly known as: KEPPRA Take 5 mLs (500 mg total) by mouth 2 (two) times daily. Replaces: levETIRAcetam 500 MG tablet   LORazepam 0.5 MG tablet Commonly known as: ATIVAN Take 1 tablet (0.5 mg total)  by mouth every 6 (six) hours as needed for anxiety.   multivitamin with minerals Tabs tablet Take 1 tablet by mouth daily. Start taking on: May 11, 2021   oxyCODONE 5 MG/5ML solution Commonly known as: ROXICODONE Take 5 mLs (5 mg total) by mouth every 4 (four) hours as needed for severe pain.   pantoprazole 40 MG tablet Commonly known as: PROTONIX Take 1 tablet (40 mg total) by mouth daily. Start taking on: May 11, 2021 What changed: when to take this   polyethylene glycol 17 g packet Commonly known as: MIRALAX / GLYCOLAX Dissolve 1 packet (17 g) in liquid and drink 2 (two) times daily.   thiamine 100 MG tablet Take 1 tablet (100 mg total) by mouth daily.   vitamin B-12 500 MCG tablet Commonly known as: CYANOCOBALAMIN Take 1 tablet (500 mcg total) by mouth daily. Start taking on: May 11, 2021       Allergies  Allergen Reactions   Aspirin Adult Low [Aspirin] Other (See Comments)    Stomach upset      The  results of significant diagnostics from this hospitalization (including imaging, microbiology, ancillary and laboratory) are listed below for reference.    Significant Diagnostic Studies: CT HEAD WO CONTRAST (5MM)  Result Date: 04/17/2021 CLINICAL DATA:  History of lung cancer with metastatic disease, altered mental status EXAM: CT HEAD WITHOUT CONTRAST TECHNIQUE: Contiguous axial images were obtained from the base of the skull through the vertex without intravenous contrast. RADIATION DOSE REDUCTION: This exam was performed according to the departmental dose-optimization program which includes automated exposure control, adjustment of the mA and/or kV according to patient size and/or use of iterative reconstruction technique. COMPARISON:  CT and MR head 03/11/2021 FINDINGS: Brain: The cystic metastatic lesion centered in the right parietal region today measures 7.5 cm AP by 4.5 cm TV by 6.4 cm cc, previously measured 6.4 cm x 3.8 cm x 6.0 cm measured at a similar level. There is new acute blood within this metastatic lesion. There is increased mass effect with increased effacement of the right lateral ventricle and up to 8 mm leftward midline shift at the level of the foramen of Monro (increased from approximately 4 mm on the prior study The smaller cystic metastatic lesion in the right frontal lobe measures 1.4 cm AP x 2.0 cm TV by 1.9 cm cc, not substantially changed in size but with evidence of new acute blood products. There is no significant mass effect caused by this lesion. The additional smaller metastatic lesion seen on the prior MRI are not well appreciated by CT. There is no evidence of evolved territorial infarct. 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 Vascular: No hyperdense vessel or unexpected calcification. Skull: Normal. Negative for fracture or focal lesion. Sinuses/Orbits: There is mild mucosal thickening in the ethmoid air cells. The globes and orbits are unremarkable. Other: None.  IMPRESSION: 1. Large cystic metastatic lesion centered in the right parietal lobe has increased in size since 03/11/2021 with acute blood products within, with increased regional mass effect now resulting in 8 mm leftward midline shift. 2. No significant interval change in size of the additional cystic metastatic lesion in the right frontal lobe, though this lesion also demonstrates acute blood products. 3. The additional metastatic lesion seen on the prior brain MRI are not well appreciated by CT. These results were called by telephone at the time of interpretation on 04/17/2021 at 10:56 am to provider [facility] , who verbally acknowledged these results.  Electronically Signed   By: Valetta Mole M.D.   On: 04/17/2021 10:56    Microbiology: No results found for this or any previous visit (from the past 240 hour(s)).   Labs: Basic Metabolic Panel: Recent Labs  Lab 05/07/21 1317  NA 135  K 4.1  CL 100  CO2 27  GLUCOSE 103*  BUN 34*  CREATININE 2.62*  CALCIUM 10.5*   Liver Function Tests: No results for input(s): AST, ALT, ALKPHOS, BILITOT, PROT, ALBUMIN in the last 168 hours. No results for input(s): LIPASE, AMYLASE in the last 168 hours. No results for input(s): AMMONIA in the last 168 hours. CBC: Recent Labs  Lab 05/04/21 0512 05/05/21 0453 05/05/21 2007 05/07/21 1317  WBC 5.1 5.6  --  6.3  HGB 7.0* 6.9* 9.3* 9.1*  HCT 21.8* 21.5* 28.5* 28.0*  MCV 103.8* 103.9*  --  100.0  PLT 242 253  --  286   Cardiac Enzymes: No results for input(s): CKTOTAL, CKMB, CKMBINDEX, TROPONINI in the last 168 hours. BNP: BNP (last 3 results) No results for input(s): BNP in the last 8760 hours.  ProBNP (last 3 results) No results for input(s): PROBNP in the last 8760 hours.  CBG: No results for input(s): GLUCAP in the last 168 hours.  FURTHER DISCHARGE INSTRUCTIONS:   Get Medicines reviewed and adjusted: Please take all your medications with you for your next visit with your Primary  MD   Laboratory/radiological data: Please request your Primary MD to go over all hospital tests and procedure/radiological results at the follow up, please ask your Primary MD to get all Hospital records sent to his/her office.   In some cases, they will be blood work, cultures and biopsy results pending at the time of your discharge. Please request that your primary care M.D. goes through all the records of your hospital data and follows up on these results.   Also Note the following: If you experience worsening of your admission symptoms, develop shortness of breath, life threatening emergency, suicidal or homicidal thoughts you must seek medical attention immediately by calling 911 or calling your MD immediately  if symptoms less severe.   You must read complete instructions/literature along with all the possible adverse reactions/side effects for all the Medicines you take and that have been prescribed to you. Take any new Medicines after you have completely understood and accpet all the possible adverse reactions/side effects.    Do not drive when taking Pain medications or sleeping medications (Benzodaizepines)   Do not take more than prescribed Pain, Sleep and Anxiety Medications. It is not advisable to combine anxiety,sleep and pain medications without talking with your primary care practitioner   Special Instructions: If you have smoked or chewed Tobacco  in the last 2 yrs please stop smoking, stop any regular Alcohol  and or any Recreational drug use.   Wear Seat belts while driving.   Please note: You were cared for by a hospitalist during your hospital stay. Once you are discharged, your primary care physician will handle any further medical issues. Please note that NO REFILLS for any discharge medications will be authorized once you are discharged, as it is imperative that you return to your primary care physician (or establish a relationship with a primary care physician if you do  not have one) for your post hospital discharge needs so that they can reassess your need for medications and monitor your lab values.     Signed:  Florencia Reasons MD, PhD, FACP  [facility] 05/10/2021, 1:24 PM

## 2021-05-10 NOTE — TOC Transition Note (Signed)
Transition of Care [facility]) - CM/SW Discharge Note  Patient Details  Name: Brenda [name] MRN: [id number] Date of Birth: [birth year]  Transition of Care [facility]) CM/SW Contact:  Sherie Don, LCSW Phone Number: 05/10/2021, 3:02 PM  Clinical Narrative: Patient is stable to discharge to [facility]. CSW spoke with niece and confirmed she has paid for patient's time at [facility] for February and March. CSW spoke with [facility] and confirmed patient can be admitted today. Discharge summary, discharge packet, and SNF transfer report faxed to facility in hub. Medical necessity form done; PTAR scheduled. Discharge packet completed. RN updated. TOC signing off.  Final next level of care: Skilled Nursing Facility Barriers to Discharge: Barriers Resolved  Patient Goals and CMS Choice CMS Medicare.gov Compare Post Acute Care list provided to:: Patient Represenative (must comment)  Discharge Placement       Patient chooses bed at: Other - please specify in the comment section below: [facility]) Patient to be transferred to facility by: [place] Name of family member notified: Melody Haver (niece) Patient and family notified of of transfer: 05/10/21  Discharge Plan and Services    DME Arranged: N/A DME Agency: NA  Readmission Risk Interventions Readmission Risk Prevention Plan 03/14/2021 07/17/2020 04/11/2020  Transportation Screening Complete Complete Complete  PCP or Specialist Appt within 3-5 Days - - Complete  HRI or [place] - Complete Complete  Social Work Consult for [place] Planning/Counseling - Complete Complete  Palliative Care Screening - Not Applicable Not Applicable  Medication Review Press photographer) Complete Complete Complete  PCP or Specialist appointment within 3-5 days of discharge Complete - -  [place] or Home Care Consult Complete - -  SW Recovery Care/Counseling Consult Complete - -  Palliative Care Screening Complete - -  [place]  Patient Refused - -  Some recent data might be hidden

## 2021-05-10 NOTE — Progress Notes (Signed)
Attempted to call [facility], 4x and no anwser, phone kept shutting off because it rang so long. Will send Discharge AVS instructions with PTAR upon discharge.

## 2021-05-11 LAB — SARS CORONAVIRUS 2 (TAT 6-24 HRS): SARS Coronavirus 2: NEGATIVE

## 2021-05-16 ENCOUNTER — Encounter: Payer: Self-pay | Admitting: Physician Assistant

## 2021-05-16 ENCOUNTER — Other Ambulatory Visit: Payer: Self-pay

## 2021-05-16 ENCOUNTER — Encounter: Payer: Self-pay | Admitting: Internal Medicine

## 2021-05-16 ENCOUNTER — Non-Acute Institutional Stay: Payer: Medicaid Other

## 2021-05-16 ENCOUNTER — Non-Acute Institutional Stay: Payer: Medicaid Other | Admitting: *Deleted

## 2021-05-16 DIAGNOSIS — Z515 Encounter for palliative care: Secondary | ICD-10-CM

## 2021-05-16 NOTE — Progress Notes (Signed)
COMMUNITY PALLIATIVE CARE SW NOTE  PATIENT NAME: Brenda [name] DOB: 1960/07/28 MRN: [id number]  PRIMARY CARE PROVIDER: Pcp, No  RESPONSIBLE PARTY:  Acct ID - Guarantor Home Phone Work Phone Relationship Acct Type  [id number] Brenda [name]* [phone]  Self P/F     [address], [place], [place] [address]   SOCIAL WORK ENCOUNTER  PC SW and RN-Brenda [name] completed a visit with patient at the [facility] facility [facility]. Patient was in bed, napping but easily aroused to verbal prompts. Patient was engaged, but confusion present as her verbalizations were scattered, non-sensical and fragmented. She initially denied pain, but later report that she has pain to her left hand/wrist, arms and legs. She denies any breathing or skin issues. She report that she does have a cough that produces grayish phlegm. Patient is total care. Patient report that she is no longer receiving therapy. Her chart indicates that all cancer treatments have been discontinued. Patient is from [place]. She has three children. Patient worked in [employer]. Patient's nurse-Brenda [name] was consulted and she verbalized no concern regarding patient. She was advised of palliative care's involvement with patient and she was encouraged to call with any additional questions or concerns.   SW completed a follow-up call to patient's PCG/niece-Brenda [name] to update her on visit with patient, and assess any concerns or needs. Brenda [name] advised that she was also a Marine scientist with hospice/palliative care background and is fully aware of the services. She stated that patient will remain at the facility through end of life. Patient has a brain mass that is bleeding,and she is no longer receiving treatments. Brenda [name] advised that they were expecting patient to be referred to hospice as they are seeking comfort care for patient. SW advised her that she will update the RN for follow-up regarding hospice. SW extended reassurance of support to patient  and family.  No other concerns noted.   [address] [place], [place]

## 2021-05-26 NOTE — Progress Notes (Signed)
[place] PALLIATIVE CARE RN NOTE  PATIENT NAME: Brenda [name] DOB: Apr 22, 1960 MRN: [id number]  PRIMARY CARE PROVIDER: Pcp, No  RESPONSIBLE PARTY: Melody Haver (niece) Acct ID - Guarantor Home Phone Work Phone Relationship Acct Type  [id number] Eula [phone]  Self P/F     [address], [place], [place] [address]   Covid-19 Pre-screening Negative  Joint palliative care new consult visit completed with LCSW, M. Lonon. Met with patient in her room at the facility [facility]. This patient has Stage IV Lung cancer with brain mets. She was hospitalized from 04/07/21 to 05/10/21. Prior to the hospitalization she was living with her son. She had palliative radiation from 03/18/21 to 04/01/21. After this she was placed on Decadron. The son stated that the patient became very agitated, aggressive and restless about 30 minutes after she took the Decadron so he took her to the hospital. She gets aggressive with any steroids so these were discontinued. She continues on Keppra. She declined surgical intervention for her brain mets and there will be no further treatment for her cancer. CT scan shows increase in the size of the brain lesion in her right parietal lobe to 48m from previous scan on 03/11/21. She was discharged to SNF where she will be long-term. Patient was pleasant, but confused. Has pain and weakness in her left arm/wrist. Also reports generalized pain. None noticed during visit. She is total care with all ADLs. She is 97.8 lbs. BMI 15.8. Palliative care will continue to follow.   (Duration of visit and documentation 45 minutes)    MDaryl Eastern RN BSN

## 2021-06-11 ENCOUNTER — Encounter: Payer: Self-pay | Admitting: Physician Assistant

## 2021-06-11 ENCOUNTER — Encounter: Payer: Self-pay | Admitting: Internal Medicine

## 2021-06-25 ENCOUNTER — Non-Acute Institutional Stay: Payer: Medicaid Other | Admitting: Internal Medicine

## 2021-06-25 VITALS — BP 110/58 | HR 96 | Wt 101.0 lb

## 2021-06-25 DIAGNOSIS — Z515 Encounter for palliative care: Secondary | ICD-10-CM | POA: Insufficient documentation

## 2021-06-25 DIAGNOSIS — T402X5A Adverse effect of other opioids, initial encounter: Secondary | ICD-10-CM

## 2021-06-25 DIAGNOSIS — K5903 Drug induced constipation: Secondary | ICD-10-CM

## 2021-06-25 DIAGNOSIS — M79602 Pain in left arm: Secondary | ICD-10-CM

## 2021-06-25 DIAGNOSIS — R519 Headache, unspecified: Secondary | ICD-10-CM

## 2021-06-25 DIAGNOSIS — E43 Unspecified severe protein-calorie malnutrition: Secondary | ICD-10-CM

## 2021-06-25 DIAGNOSIS — C3491 Malignant neoplasm of unspecified part of right bronchus or lung: Secondary | ICD-10-CM

## 2021-06-25 MED ORDER — OXYCODONE HCL 5 MG PO CAPS: 5.0000 mg | ORAL_CAPSULE | Freq: Four times a day (QID) | ORAL | 0 refills | Status: DC

## 2021-06-25 MED ORDER — SENNA-DOCUSATE SODIUM 8.6-50 MG PO TABS: 2.0000 | ORAL_TABLET | Freq: Every day | ORAL | 5 refills | Status: DC

## 2021-06-25 NOTE — Progress Notes (Signed)
[place] Follow-Up Visit Telephone: [phone]  Fax: [phone]    Date of encounter: 06/25/21 2:48 PM PATIENT NAME: Brenda [name] [id number]   [phone] (home)  DOB: Aug 14, 1960 MRN: [id number] PRIMARY CARE PROVIDER:    Pcp, No,  No address on file None   REFERRING PROVIDER:   No referring provider defined for this encounter. N/A   RESPONSIBLE PARTY:    Contact Information       Name Relation Home Work Mobile    Jeffers Niece [phone]   [phone]    [name]     [phone]    Clearnce Sorrel     [phone]             I met face to face with patient and family in [facility] facility. Palliative Care was asked to follow this patient by consultation request of No ref. provider found to address advance care planning and complex medical decision making. This is follow-up visit.                                       ASSESSMENT AND PLAN / RECOMMENDATIONS:    Advance Care Planning/Goals of Care: Goals include to maximize quality of life and symptom management. Patient/health care surrogate gave his/her permission to discuss advance care planning conversation included a discussion about:    The value and importance of advance care planning.  Experiences with loved ones who have been seriously ill or have died.  Exploration of personal, cultural or spiritual beliefs that might influence medical decisions.  Exploration of goals of care in the event of a sudden injury or illness.  Identification of a healthcare agent.  Review and updating or creation of an advance directive document. Decision not to resuscitate or to de-escalate disease focused treatments due to poor prognosis.  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 Unable to tolerate Decadron with AMS and increased agitation. No further treatments planned beyond palliative care. Plan: Start Oxycodone 2m Q6H while awake for pain management of daily HA's and LUE pain. Continue Keppra BID for seizure management. Continue Gabapentin QHS for pain management. Continue Ativan PRN for anxiety/agitation management.   Constipation: reports occasional constipation with increased straining. Starting new scheduled opioid for symptom management. Plan: Start Senna-S 2 tabs daily. Continue Miralax BID PRN and Dulcolax suppository daily PRN for unrelieved constipation. Encourage fluids, fruits & veggies, and mobility.  Protein calorie malnutrition: good appetite and eating 75-100% of meals. Able to feed self but starting to need some assist and cues. Recent weight gain from 97.8 lbs (05/16/21) to 101 (06/12/21). BMI 16.3. Plan: Continue Ensure BID between meals. Monitor PO intake and ability to feed self.   Palliative Care Encounter: Attempts made to contact AMelody Haver niece & responsible party, regarding palliative care goals and hospice eligibility. Left VM requesting call back for discussion about goals of care and options to increase supportive care in facility.   Follow up Palliative Care Visit: Palliative care will continue to follow for complex medical decision making, advance care planning, and clarification of goals. Return 8 weeks or prn.   This visit was coded based on medical decision making (MDM).   PPS: 30%   HOSPICE  ELIGIBILITY/DIAGNOSIS: Stage IV lung cancer with brain metastasis   Chief Complaint: Follow-up palliative visit   HISTORY OF PRESENT ILLNESS:  Brenda [name] is a 61 y.o. 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 Seen today for palliative care follow up in facility. She had declined  brain surgery and received palliative radiation with Decadron for brain mets 03/18/21-04/01/21. Hospitalized 04/07/21-05/10/21 for AMS and severe agitation after taking Decadron at home. CT brain scan on 04/07/21 showed progressive enlargement of metastatic disease. Was living with son prior to last hospitalization, however, son was no longer able to provide care for patient at home and she was discharged to SNF for LTC.  Today, patient is alert, pleasant, and cooperative. Oriented to person only. Facility staff reports increased confusion and disorientation, believes her husband and son are in the building and frequently tries to find them. Patient reports sleeping well and napping at time during the day. Reports almost daily headaches, that are moderate to severe, and concentrate in right eye. Reports new pain in her left upper arm that limits her mobility. No shoulder or soft tissue deformity noted. Increasing numbness and functional loss in left hand as well. Denies cough, dyspnea or orthopnea. Good appetite, eating 75-100% of meals. Reports some recent straining with BM's. Wearing incontinent briefs but states she will ask to go sometimes. Skin intact. Bedbound and total care for all ADLs except feeding, although staff is having to assist with this from time to time. Two assist to transfer from bed to wheelchair, she can still self-propel in wheelchair around facility halls.   History obtained from review of EMR, discussion with primary team, and interview with family, facility staff/caregiver and/or Brenda [name].  I reviewed available labs, medications, imaging, studies and related documents from the EMR.  Records reviewed and summarized above.    ROS   General: NAD EYES: denies vision changes ENMT: denies dysphagia Cardiovascular: denies chest pain, denies DOE Pulmonary: denies cough, denies increased SOB Abdomen: endorses good appetite, intermittent constipation with straining, endorses occasional  continence of bowel, wears incontinent briefs. GU: denies dysuria, endorses occasional continence of urine, wears incontinent briefs. MSK:  reports increased weakness and limited mobility of LUE and decreased functionality of left hand, no falls reported Skin: denies rashes or wounds Neurological: Reports headaches almost daily that concentrate in right eye, and new left upper arm pain, denies insomnia Psych: Endorses positive mood Heme/lymph/immuno: denies bruises, abnormal bleeding   Physical Exam: HR 96, BP 110/58, RR 20, 02 Sat 98% RA  Current and past weights: 101 lbs (06/12/21), 97.8 lbs (05/16/21) Constitutional: NAD General: frail appearing, thin  EYES: anicteric sclera, lids intact, no discharge  ENMT: intact hearing, oral mucous membranes moist, dentition intact CV: S1S2, RRR, no LE edema Pulmonary: LCTA, no increased work of breathing, no cough, room air Abdomen: intake 100%, normo-active BS + 4 quadrants, soft and non-tender, no ascites GU: deferred MSK: BUE and BLE sarcopenia, left shoulder extension and abduction limited to 90%, non-ambulatory, needs 2 person assist to transfer from bed to wheelchair, able to self-propel in wheelchair around facility halls. Skin: warm and dry, no rashes or wounds on visible skin Neuro:  no generalized weakness, positive cognitive impairment, alert and oriented to person only. Psych: non-anxious affect, pleasant and cooperative. Hem/lymph/immuno: no widespread bruising.  CURRENT PROBLEM LIST:      Patient Active Problem List  Diagnosis Date Noted   Chronic combined systolic and diastolic congestive heart failure ([place]) 04/30/2021   Stage 3b chronic kidney disease (CKD) ([place]) 04/30/2021   Anemia of chronic disease 04/30/2021   Brain mass 04/08/2021   Protein-calorie malnutrition, severe 03/12/2021   Primary malignant neoplasm of lung with metastasis to brain ([place]) 03/11/2021   Exudative pleural effusion     Sinus tachycardia      Pleural effusion     S/P thoracentesis     Hypomagnesemia     Acute on chronic combined systolic and diastolic congestive heart failure (HCC)     Bacteremia associated with intravascular line ([place]) 07/03/2020   Acute blood loss anemia     Renal failure 07/01/2020   AKI (acute kidney injury) ([place])     Metabolic acidosis     Epistaxis     Seizure-like activity (HCC)     Swelling of lower extremity 05/29/2020   Hypoalbuminemia 05/29/2020   Acute on chronic anemia 04/07/2020   Gastrointestinal hemorrhage 04/07/2020   Acute encephalopathy 04/07/2020   Pancytopenia ([place]) 04/07/2020   Polysubstance abuse ([place]) 04/07/2020   Hypokalemia 10/19/2019   Neutropenia ([place]) 10/05/2019   Malignant neoplasm of right lung ([place]) 09/14/2019   Encounter for antineoplastic chemotherapy 09/14/2019   Encounter for antineoplastic immunotherapy 09/14/2019   Goals of care, counseling/discussion 09/14/2019   Tobacco abuse counseling 09/14/2019   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 ([place])     Alcohol abuse     Acute respiratory failure ([place]) 04/03/2019   Lung mass     Multifocal pneumonia     Major depressive disorder     Major depressive disorder, recurrent episode with mood-congruent psychotic features ([place]) 05/30/2017   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[place]) 12/26/2011    PAST MEDICAL HISTORY:      Active Ambulatory Problems    Diagnosis Date Noted   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[place]) 12/26/2011   Major depressive disorder, recurrent episode with mood-congruent psychotic features ([place]) 05/30/2017   Acute respiratory failure ([place]) 04/03/2019   Lung mass     Multifocal pneumonia     Major depressive disorder     Alcohol abuse     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 ([place])     Malignant neoplasm of right lung ([place]) 09/14/2019   Encounter for antineoplastic chemotherapy 09/14/2019   Encounter for antineoplastic immunotherapy 09/14/2019   Goals of care,  counseling/discussion 09/14/2019   Tobacco abuse counseling 09/14/2019   Neutropenia ([place]) 10/05/2019   Hypokalemia 10/19/2019   Acute on chronic anemia 04/07/2020   Gastrointestinal hemorrhage 04/07/2020   Acute encephalopathy 04/07/2020   Pancytopenia ([place]) 04/07/2020   Polysubstance abuse ([place]) 04/07/2020   Swelling of lower extremity 05/29/2020   Hypoalbuminemia 05/29/2020   Renal failure 07/01/2020   AKI (acute kidney injury) ([place])     Metabolic acidosis     Epistaxis     Seizure-like activity (HCC)     Acute blood loss anemia     Bacteremia associated with intravascular line ([place]) 07/03/2020   Pleural effusion     S/P thoracentesis     Hypomagnesemia     Acute on chronic combined systolic and diastolic congestive heart failure (HCC)     Exudative pleural effusion     Sinus tachycardia     Primary malignant neoplasm of lung with metastasis to brain ([place]) 03/11/2021   Protein-calorie malnutrition, severe 03/12/2021   Brain mass 04/08/2021   Chronic combined systolic and diastolic congestive heart failure ([place]) 04/30/2021   Stage 3b  chronic kidney disease (CKD) ([place]) 04/30/2021   Anemia of chronic disease 04/30/2021        Resolved Ambulatory Problems    Diagnosis Date Noted   Major depressive disorder, single episode with psychotic features ([place]) 05/30/2017   Altered mental status 04/07/2021        Past Medical History:  Diagnosis Date   Breast cancer ([place])     Depression     Mental disorder      SOCIAL HX:  Social History         Tobacco Use   Smoking status: Former      Packs/day: 0.50      Years: 30.00      Pack years: 15.00      Types: Cigarettes      Quit date: 05/27/2019      Years since quitting: 2.0   Smokeless tobacco: Never  Substance Use Topics   Alcohol use: Yes      Alcohol/week: 84.0 standard drinks      Types: 84 Cans of beer per week        ALLERGIES:       Allergies  Allergen Reactions   Aspirin Adult Low [Aspirin] Other (See  Comments)      Stomach upset     PERTINENT MEDICATIONS:      Outpatient Encounter Medications as of 06/25/2021  Medication Sig   acetaminophen (TYLENOL) 325 MG tablet Take 2 tablets (650 mg total) by mouth every 6 (six) hours as needed for mild pain, moderate pain or headache. (Patient not taking: Reported on 03/12/2021)   bisacodyl (DULCOLAX) 10 MG suppository Place 1 suppository (10 mg total) rectally daily as needed for moderate constipation.   carvedilol (COREG) 3.125 MG tablet Take 1 tablet (3.125 mg total) by mouth 2 (two) times daily.   feeding supplement (ENSURE ENLIVE / ENSURE PLUS) LIQD Take 237 mLs by mouth 2 (two) times daily between meals.   ferrous sulfate 325 (65 FE) MG tablet Take 1 tablet (325 mg total) by mouth daily with breakfast.   folic acid (FOLVITE) 1 MG tablet TAKE 1 TABLET (1 MG TOTAL) BY MOUTH DAILY.   gabapentin (NEURONTIN) 300 MG capsule Take 1 capsule (300 mg total) by mouth at bedtime. (Patient not taking: Reported on 03/12/2021)   levETIRAcetam (KEPPRA) 100 MG/ML solution Take 5 mLs (500 mg total) by mouth 2 (two) times daily.   LORazepam (ATIVAN) 0.5 MG tablet Take 1 tablet (0.5 mg total) by mouth every 6 (six) hours as needed for anxiety.   Multiple Vitamin (MULTIVITAMIN WITH MINERALS) TABS tablet Take 1 tablet by mouth daily.   oxyCODONE (ROXICODONE) 5 MG/5ML solution Take 5 mLs (5 mg total) by mouth every 4 (four) hours as needed for severe pain.   pantoprazole (PROTONIX) 40 MG tablet Take 1 tablet (40 mg total) by mouth daily.   polyethylene glycol (MIRALAX / GLYCOLAX) 17 g packet Dissolve 1 packet (17 g) in liquid and drink 2 (two) times daily. (Patient not taking: Reported on 03/12/2021)   thiamine 100 MG tablet Take 1 tablet (100 mg total) by mouth daily. (Patient not taking: Reported on 03/12/2021)   vitamin B-12 (CYANOCOBALAMIN) 500 MCG tablet Take 1 tablet (500 mcg total) by mouth daily.   [DISCONTINUED] prochlorperazine (COMPAZINE) 10 MG tablet Take 1  tablet (10 mg total) by mouth every 6 (six) hours as needed for nausea or vomiting. (Patient not taking: Reported on 04/08/2020)    No facility-administered encounter medications on file as of 06/25/2021.  Thank you for the opportunity to participate in the care of Brenda [name].  The palliative care team will continue to follow. Please call our office at [phone] if we can be of additional assistance.    Hollace Kinnier, DO   COVID-19 PATIENT SCREENING TOOL Asked and negative response unless otherwise noted:   Have you had symptoms of covid, tested positive or been in contact with someone with symptoms/positive test in the past 5-10 days? no

## 2021-06-27 ENCOUNTER — Encounter: Payer: Self-pay | Admitting: Internal Medicine

## 2021-07-10 ENCOUNTER — Other Ambulatory Visit: Payer: Self-pay | Admitting: Nurse Practitioner

## 2021-07-11 ENCOUNTER — Other Ambulatory Visit: Payer: Self-pay | Admitting: Nurse Practitioner

## 2021-09-24 ENCOUNTER — Emergency Department (HOSPITAL_COMMUNITY): Payer: Medicaid Other

## 2021-09-24 ENCOUNTER — Inpatient Hospital Stay (HOSPITAL_COMMUNITY)
Admission: EM | Admit: 2021-09-24 | Discharge: 2021-09-29 | DRG: 659 | Disposition: A | Discharge status: Skilled Nursing Facility | Payer: Medicaid Other | Attending: Family Medicine | Admitting: Family Medicine

## 2021-09-24 ENCOUNTER — Encounter (HOSPITAL_COMMUNITY): Payer: Self-pay | Admitting: Emergency Medicine

## 2021-09-24 ENCOUNTER — Other Ambulatory Visit: Payer: Self-pay

## 2021-09-24 DIAGNOSIS — I10 Essential (primary) hypertension: Secondary | ICD-10-CM | POA: Diagnosis present

## 2021-09-24 DIAGNOSIS — I130 Hypertensive heart and chronic kidney disease with heart failure and stage 1 through stage 4 chronic kidney disease, or unspecified chronic kidney disease: Secondary | ICD-10-CM | POA: Diagnosis present

## 2021-09-24 DIAGNOSIS — D509 Iron deficiency anemia, unspecified: Secondary | ICD-10-CM | POA: Diagnosis present

## 2021-09-24 DIAGNOSIS — D649 Anemia, unspecified: Secondary | ICD-10-CM | POA: Diagnosis not present

## 2021-09-24 DIAGNOSIS — Z681 Body mass index (BMI) 19 or less, adult: Secondary | ICD-10-CM

## 2021-09-24 DIAGNOSIS — N179 Acute kidney failure, unspecified: Secondary | ICD-10-CM | POA: Diagnosis not present

## 2021-09-24 DIAGNOSIS — E440 Moderate protein-calorie malnutrition: Secondary | ICD-10-CM | POA: Diagnosis present

## 2021-09-24 DIAGNOSIS — I5042 Chronic combined systolic (congestive) and diastolic (congestive) heart failure: Secondary | ICD-10-CM | POA: Diagnosis present

## 2021-09-24 DIAGNOSIS — Z79899 Other long term (current) drug therapy: Secondary | ICD-10-CM

## 2021-09-24 DIAGNOSIS — C772 Secondary and unspecified malignant neoplasm of intra-abdominal lymph nodes: Secondary | ICD-10-CM | POA: Diagnosis present

## 2021-09-24 DIAGNOSIS — R569 Unspecified convulsions: Secondary | ICD-10-CM

## 2021-09-24 DIAGNOSIS — Z8249 Family history of ischemic heart disease and other diseases of the circulatory system: Secondary | ICD-10-CM

## 2021-09-24 DIAGNOSIS — C3491 Malignant neoplasm of unspecified part of right bronchus or lung: Secondary | ICD-10-CM | POA: Diagnosis present

## 2021-09-24 DIAGNOSIS — N131 Hydronephrosis with ureteral stricture, not elsewhere classified: Secondary | ICD-10-CM | POA: Diagnosis present

## 2021-09-24 DIAGNOSIS — D631 Anemia in chronic kidney disease: Secondary | ICD-10-CM | POA: Diagnosis present

## 2021-09-24 DIAGNOSIS — D638 Anemia in other chronic diseases classified elsewhere: Secondary | ICD-10-CM | POA: Diagnosis present

## 2021-09-24 DIAGNOSIS — N1832 Chronic kidney disease, stage 3b: Secondary | ICD-10-CM | POA: Diagnosis present

## 2021-09-24 DIAGNOSIS — C7971 Secondary malignant neoplasm of right adrenal gland: Secondary | ICD-10-CM | POA: Diagnosis present

## 2021-09-24 DIAGNOSIS — C7989 Secondary malignant neoplasm of other specified sites: Secondary | ICD-10-CM | POA: Diagnosis present

## 2021-09-24 DIAGNOSIS — Z515 Encounter for palliative care: Secondary | ICD-10-CM

## 2021-09-24 DIAGNOSIS — Z21 Asymptomatic human immunodeficiency virus [HIV] infection status: Secondary | ICD-10-CM | POA: Diagnosis present

## 2021-09-24 DIAGNOSIS — Z87891 Personal history of nicotine dependence: Secondary | ICD-10-CM

## 2021-09-24 DIAGNOSIS — Z853 Personal history of malignant neoplasm of breast: Secondary | ICD-10-CM

## 2021-09-24 DIAGNOSIS — N63 Unspecified lump in unspecified breast: Secondary | ICD-10-CM | POA: Diagnosis present

## 2021-09-24 DIAGNOSIS — Z923 Personal history of irradiation: Secondary | ICD-10-CM

## 2021-09-24 DIAGNOSIS — Z20822 Contact with and (suspected) exposure to covid-19: Secondary | ICD-10-CM | POA: Diagnosis present

## 2021-09-24 DIAGNOSIS — Z66 Do not resuscitate: Secondary | ICD-10-CM | POA: Diagnosis present

## 2021-09-24 DIAGNOSIS — Z809 Family history of malignant neoplasm, unspecified: Secondary | ICD-10-CM

## 2021-09-24 DIAGNOSIS — G9341 Metabolic encephalopathy: Secondary | ICD-10-CM | POA: Diagnosis present

## 2021-09-24 DIAGNOSIS — R443 Hallucinations, unspecified: Secondary | ICD-10-CM | POA: Diagnosis not present

## 2021-09-24 DIAGNOSIS — F32A Depression, unspecified: Secondary | ICD-10-CM | POA: Diagnosis present

## 2021-09-24 DIAGNOSIS — C3490 Malignant neoplasm of unspecified part of unspecified bronchus or lung: Secondary | ICD-10-CM | POA: Diagnosis not present

## 2021-09-24 DIAGNOSIS — Z9221 Personal history of antineoplastic chemotherapy: Secondary | ICD-10-CM

## 2021-09-24 DIAGNOSIS — N1330 Unspecified hydronephrosis: Secondary | ICD-10-CM

## 2021-09-24 DIAGNOSIS — N6321 Unspecified lump in the left breast, upper outer quadrant: Secondary | ICD-10-CM

## 2021-09-24 DIAGNOSIS — E43 Unspecified severe protein-calorie malnutrition: Secondary | ICD-10-CM | POA: Diagnosis present

## 2021-09-24 DIAGNOSIS — R627 Adult failure to thrive: Secondary | ICD-10-CM | POA: Diagnosis present

## 2021-09-24 DIAGNOSIS — C7931 Secondary malignant neoplasm of brain: Secondary | ICD-10-CM | POA: Diagnosis present

## 2021-09-24 DIAGNOSIS — J90 Pleural effusion, not elsewhere classified: Secondary | ICD-10-CM | POA: Diagnosis present

## 2021-09-24 LAB — COMPREHENSIVE METABOLIC PANEL
ALT: 8 U/L (ref 0–44)
AST: 20 U/L (ref 15–41)
Albumin: 3.2 g/dL — ABNORMAL LOW (ref 3.5–5.0)
Alkaline Phosphatase: 94 U/L (ref 38–126)
Anion gap: 9 (ref 5–15)
BUN: 50 mg/dL — ABNORMAL HIGH (ref 6–20)
CO2: 22 mmol/L (ref 22–32)
Calcium: 9.1 mg/dL (ref 8.9–10.3)
Chloride: 108 mmol/L (ref 98–111)
Creatinine, Ser: 4.56 mg/dL — ABNORMAL HIGH (ref 0.44–1.00)
GFR, Estimated: 11 mL/min — ABNORMAL LOW (ref >60)
Glucose, Bld: 84 mg/dL (ref 70–99)
Potassium: 4.3 mmol/L (ref 3.5–5.1)
Sodium: 139 mmol/L (ref 135–145)
Total Bilirubin: 0.4 mg/dL (ref 0.3–1.2)
Total Protein: 7.8 g/dL (ref 6.5–8.1)

## 2021-09-24 LAB — CBC WITH DIFFERENTIAL/PLATELET
Abs Immature Granulocytes: 0.02 10*3/uL (ref 0.00–0.07)
Basophils Absolute: 0 10*3/uL (ref 0.0–0.1)
Basophils Relative: 0 %
Eosinophils Absolute: 0.1 10*3/uL (ref 0.0–0.5)
Eosinophils Relative: 3 %
HCT: 27.4 % — ABNORMAL LOW (ref 36.0–46.0)
Hemoglobin: 8.4 g/dL — ABNORMAL LOW (ref 12.0–15.0)
Immature Granulocytes: 0 %
Lymphocytes Relative: 31 %
Lymphs Abs: 1.6 10*3/uL (ref 0.7–4.0)
MCH: 31.5 pg (ref 26.0–34.0)
MCHC: 30.7 g/dL (ref 30.0–36.0)
MCV: 102.6 fL — ABNORMAL HIGH (ref 80.0–100.0)
Monocytes Absolute: 0.7 10*3/uL (ref 0.1–1.0)
Monocytes Relative: 13 %
Neutro Abs: 2.7 10*3/uL (ref 1.7–7.7)
Neutrophils Relative %: 53 %
Platelets: 264 10*3/uL (ref 150–400)
RBC: 2.67 MIL/uL — ABNORMAL LOW (ref 3.87–5.11)
RDW: 14 % (ref 11.5–15.5)
WBC: 5.2 10*3/uL (ref 4.0–10.5)
nRBC: 0 % (ref 0.0–0.2)

## 2021-09-24 MED ORDER — MORPHINE SULFATE (PF) 4 MG/ML IV SOLN
4.0000 mg | Freq: Once | INTRAVENOUS | Status: AC
  Administered 2021-09-24: 4 mg via INTRAVENOUS
  Filled 2021-09-24: qty 1

## 2021-09-24 MED ORDER — SODIUM CHLORIDE 0.9 % IV BOLUS
1000.0000 mL | Freq: Once | INTRAVENOUS | Status: AC
  Administered 2021-09-24: 1000 mL via INTRAVENOUS

## 2021-09-24 NOTE — ED Triage Notes (Signed)
Pt BIB EMS from [facility] with c/o facial and neck swelling since yesterday. Pt has hx of lung cancer. Airway clear, no difficulty breathing.

## 2021-09-24 NOTE — ED Provider Notes (Signed)
[facility] DEPT Provider Note   CSN: [id number] Arrival date & time: 09/24/21  2025     History  Chief Complaint  Patient presents with   Facial Swelling    Brenda [name] is a 61 y.o. female history of lung cancer with brain mets, here presenting with multiple lymph node swelling.  Patient states that today she noticed tender lymph node in the left side of her neck.  She also noticed a tender lump on the abdomen for several weeks.  Patient states that she has some swelling in the left side of her face as well.  I reviewed her chart and she has history of lung cancer with brain mets and is receiving palliative radiation.  Denies any fevers or chills or cough.  The history is provided by the patient.       Home Medications Prior to Admission medications   Medication Sig Start Date End Date Taking? Authorizing Provider  acetaminophen (TYLENOL) 325 MG tablet Take 2 tablets (650 mg total) by mouth every 6 (six) hours as needed for mild pain, moderate pain or headache. Patient not taking: Reported on 03/12/2021 04/11/20   Domenic Polite, MD  bisacodyl (DULCOLAX) 10 MG suppository Place 1 suppository (10 mg total) rectally daily as needed for moderate constipation. 05/10/21   Florencia Reasons, MD  carvedilol (COREG) 3.125 MG tablet Take 1 tablet (3.125 mg total) by mouth 2 (two) times daily. 05/10/21   Florencia Reasons, MD  feeding supplement (ENSURE ENLIVE / ENSURE PLUS) LIQD Take 237 mLs by mouth 2 (two) times daily between meals. 05/10/21   Florencia Reasons, MD  ferrous sulfate 325 (65 FE) MG tablet Take 1 tablet (325 mg total) by mouth daily with breakfast. 05/11/21   Florencia Reasons, MD  folic acid (FOLVITE) 1 MG tablet TAKE 1 TABLET (1 MG TOTAL) BY MOUTH DAILY. 05/10/21 05/10/22  Florencia Reasons, MD  gabapentin (NEURONTIN) 300 MG capsule Take 1 capsule (300 mg total) by mouth at bedtime. Patient not taking: Reported on 03/12/2021 06/11/20   Harle Stanford., PA-C  levETIRAcetam (KEPPRA) 100 MG/ML  solution Take 5 mLs (500 mg total) by mouth 2 (two) times daily. 05/10/21   Florencia Reasons, MD  LORazepam (ATIVAN) 0.5 MG tablet Take 1 tablet (0.5 mg total) by mouth every 6 (six) hours as needed for anxiety. 05/10/21   Florencia Reasons, MD  Multiple Vitamin (MULTIVITAMIN WITH MINERALS) TABS tablet Take 1 tablet by mouth daily. 05/11/21   Florencia Reasons, MD  oxycodone (OXY-IR) 5 MG capsule Take 1 capsule (5 mg total) by mouth every 6 (six) hours. 06/25/21   Reed, Tiffany L, DO  oxyCODONE (ROXICODONE) 5 MG/5ML solution Take 5 mLs (5 mg total) by mouth every 4 (four) hours as needed for severe pain. 05/10/21   Florencia Reasons, MD  pantoprazole (PROTONIX) 40 MG tablet Take 1 tablet (40 mg total) by mouth daily. 05/11/21   Florencia Reasons, MD  polyethylene glycol (MIRALAX / GLYCOLAX) 17 g packet Dissolve 1 packet (17 g) in liquid and drink 2 (two) times daily. Patient not taking: Reported on 03/12/2021 07/26/20   Arrien, Jimmy Picket, MD  sennosides-docusate sodium (SENOKOT-S) 8.6-50 MG tablet Take 2 tablets by mouth daily. 06/25/21   Reed, Tiffany L, DO  thiamine 100 MG tablet Take 1 tablet (100 mg total) by mouth daily. Patient not taking: Reported on 03/12/2021 08/16/20   Eugenie Filler, MD  vitamin B-12 (CYANOCOBALAMIN) 500 MCG tablet Take 1 tablet (500 mcg total) by mouth daily.  05/11/21   Florencia Reasons, MD  prochlorperazine (COMPAZINE) 10 MG tablet Take 1 tablet (10 mg total) by mouth every 6 (six) hours as needed for nausea or vomiting. Patient not taking: Reported on 04/08/2020 01/17/20 04/11/20  Heilingoetter, Cassandra L, PA-C      Allergies    Aspirin adult low [aspirin]    Review of Systems   Review of Systems  HENT:         Neck swelling   Gastrointestinal:  Positive for abdominal pain.  All other systems reviewed and are negative.   Physical Exam Updated Vital Signs BP (!) 143/83   Pulse 96   Temp 97.8 F (36.6 C)   Resp 18   Ht 5\' 6"  (1.676 m)   Wt 46 kg   LMP 01/19/2011   SpO2 100%   BMI 16.37 kg/m  Physical  Exam Vitals and nursing note reviewed.  Constitutional:      Comments: Chronically ill-appearing  HENT:     Head: Normocephalic.     Nose: Nose normal.     Mouth/Throat:     Mouth: Mucous membranes are dry.     Comments: Patient has firm area on the left side of her face Eyes:     Extraocular Movements: Extraocular movements intact.     Pupils: Pupils are equal, round, and reactive to light.  Neck:     Comments: Patient has a firm left lower neck nodule Cardiovascular:     Rate and Rhythm: Normal rate and regular rhythm.     Pulses: Normal pulses.  Pulmonary:     Effort: Pulmonary effort is normal.     Breath sounds: Normal breath sounds.  Abdominal:     General: Abdomen is flat.     Comments: Patient has a firm tender nodule in the epigastric area  Musculoskeletal:        General: Normal range of motion.     Cervical back: Normal range of motion.  Skin:    General: Skin is warm.     Capillary Refill: Capillary refill takes less than 2 seconds.  Neurological:     General: No focal deficit present.     Mental Status: She is oriented to person, place, and time.  Psychiatric:        Mood and Affect: Mood normal.        Behavior: Behavior normal.     ED Results / Procedures / Treatments   Labs (all labs ordered are listed, but only abnormal results are displayed) Labs Reviewed  CBC WITH DIFFERENTIAL/PLATELET  COMPREHENSIVE METABOLIC PANEL    EKG None  Radiology No results found.  Procedures Procedures    Medications Ordered in ED Medications  sodium chloride 0.9 % bolus 1,000 mL (has no administration in time range)  morphine (PF) 4 MG/ML injection 4 mg (has no administration in time range)    ED Course/ Medical Decision Making/ A&P                           Medical Decision Making Brenda [name] is a 61 y.o. female here presenting with increasing nodules in her neck and face and also abdomen.  Patient has metastatic lung cancer.  Patient was treated  with palliative radiation previously.  Patient saw palliative care in April and patient remained full code and they deferred any significant decisions at that time.  I talked to the patient and the niece.  Since she has been in the  nursing home, her mental status has remarkably improved.  They would like to evaluate for her cancer and she remains full code.  I told her that the prognosis appears to be very poor.  We will get CBC and CMP and CT head and CT neck and CT chest abdomen pelvis   10:16 PM I reviewed patient's labs and independently interpreted imaging studies.  Patient basically has worsening metastatic disease.  In particular, patient has moderate right hydronephrosis from obstructing pelvis mass.  Patient also has peritoneal lesions as well as metastatic lesions in the neck and no mets in the brain.  Her kidney function also has worsened and creatinine is now 4.5.  I again had discussion with her niece as well as with patient.  They would want to be admitted for hydration and also want to get a second opinion from oncology again.  At this point, hospitalist to admit and oncology to see patient in the morning   Problems Addressed: AKI (acute kidney injury) [facility]): acute illness or injury Hydronephrosis, unspecified hydronephrosis type: chronic illness or injury with exacerbation, progression, or side effects of treatment Primary malignant neoplasm of lung metastatic to other site, unspecified laterality [facility]): chronic illness or injury  Amount and/or Complexity of Data Reviewed Labs: ordered. Decision-making details documented in ED Course. Radiology: ordered and independent interpretation performed. Decision-making details documented in ED Course.  Risk Prescription drug management. Decision regarding hospitalization.    Final Clinical Impression(s) / ED Diagnoses Final diagnoses:  None    Rx / DC Orders ED Discharge Orders     None         Drenda Freeze,  MD 09/24/21 2218

## 2021-09-24 NOTE — Assessment & Plan Note (Signed)
In the setting of metastatic disease involving kidneys most likely causes hydronephrosis.  Secondary to large pelvic tumor burden Appreciate oncology consult regarding goals of care discussion and plan Unclear if patient would be candidate for radiation therapy versus chemotherapy versus immunotherapy

## 2021-09-24 NOTE — Assessment & Plan Note (Signed)
Mild worsening renal failure most likely secondary to hydronephrosis secondary to tumor burden. We will need to have further discussion with oncology regarding overall goals of care and what could be attempted

## 2021-09-24 NOTE — Assessment & Plan Note (Signed)
Check prealbumin order nutritional consult make sure patient is on thiamine

## 2021-09-24 NOTE — Assessment & Plan Note (Signed)
continue coreg

## 2021-09-24 NOTE — H&P (Signed)
[name] ZOX:[id number] DOB: [birth year] DOA: 09/24/2021   PCP: Merryl Hacker, No   Outpatient Specialists:    Oncology Dr.[name]   Patient arrived to ER on 09/24/21 at 2025 Referred by Attending Drenda Freeze, MD   Patient coming from:     From facility [facility]  Chief Complaint:   Chief Complaint  Patient presents with   Facial Swelling    HPI: Brenda [name] is a 61 y.o. 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 currently receiving palliative radiation therapy, CKD stage 3b, chronic combined systolic diastolic CHF, anemia, protein calorie malnutrition, history of pleural effusion    Presented with   facial swelling Coming from [place] with facial and neck swelling started yesterday No difficulty breathing Patient with history of lung cancer metastasis to the brain Noted to have significant lymph node swelling around the neck region Also tender lump on the abdomen for few weeks Patient is currently receiving palliative radiation No fevers no chills no cough On Keppra for prophylaxis of seizures Regarding patient's history of brain metastases in the past patient has declined brain surgery but could not tolerate Decadron secondary to altered mental status and agitation and that was discontinued Patient has chronic headache now which she was treated with oxycodone  Niece does not have medical power of attorney    Regarding pertinent Chronic problems:      HTN on Coreg   chronic CHF diastolic/systolic/ combined - last echo may 2022 EF 81-19% Grade I diastolic       CKD stage IIIb- baseline Cr  2.6 Estimated Creatinine Clearance: 9.6 mL/min (A) (by C-G formula based on SCr of 4.56 mg/dL (H)).  Lab Results  Component Value Date   CREATININE 4.56 (H) 09/24/2021   CREATININE 2.62 (H) 05/07/2021   CREATININE 2.29 (H) 05/02/2021    Chronic anemia - baseline hg Hemoglobin & Hematocrit  Recent Labs     05/05/21 2007 05/07/21 1317 09/24/21 2037  HGB 9.3* 9.1* 8.4*     While in ER:    Worsening renal function Due to obstruction   CT HEAD Known cystic metastatic lesions in the parietal and frontal lobes on the right are decreased in size from the prior exam with reduced mass effect. A thin hyperdense rim is noted in both lesions inferiorly in the possibility of blood products can not be completely excluded. MRI is recommended for further evaluation.   CT neck soft tissue . Transspatial left facial mass that abuts the inferior aspect of the left masseter muscle, most consistent with metastatic disease. 2. A 2.2 cm left level 5A lymph node. 3. A 17 mm nodule in the right upper lobe, more completely evaluated on concomitant chest CT.  CTabd/pelvis -  worsening metastatic disease right hydronephrosis with likely obstruction from mass lesion noted along the renal pelvis/hilum measuring 3.2 x 3.1 cm. right adrenal metastasis, bilateral renal masses/metastases CT  chest - Interval increase in size of the right upper lobe dominant mass, multiple subcutaneus soft tissue metastases. adjacent right upper lobe satellite nodule, and known right lower lobe subpleural nodule. loculated at least moderate volume left pleural effusion. Query underlying left lower lobe mass lesion.    Following Medications were ordered in ER: Medications  sodium chloride 0.9 % bolus 1,000 mL (1,000 mLs Intravenous New Bag/Given 09/24/21 2126)  morphine (PF) 4 MG/ML injection 4 mg (4 mg Intravenous Given 09/24/21 2128)    ________    ED Triage Vitals [  09/24/21 2030]  Enc Vitals Group     BP (!) 143/83     Pulse Rate 95     Resp 18     Temp 97.8 F (36.6 C)     Temp src      SpO2 100 %     Weight 101 lb 6.6 oz (46 kg)     Height 5\' 6"  (1.676 m)     Head Circumference      Peak Flow      Pain Score 3     Pain Loc      Pain Edu?      Excl. in [place]?   SEGB(15)@      _________________________________________ Significant initial  Findings: Abnormal Labs Reviewed  CBC WITH DIFFERENTIAL/PLATELET - Abnormal; Notable for the following components:      Result Value   RBC 2.67 (*)    Hemoglobin 8.4 (*)    HCT 27.4 (*)    MCV 102.6 (*)    All other components within normal limits  COMPREHENSIVE METABOLIC PANEL - Abnormal; Notable for the following components:   BUN 50 (*)    Creatinine, Ser 4.56 (*)    Albumin 3.2 (*)    GFR, Estimated 11 (*)    All other components within normal limits      ECG: Ordered     The recent clinical data is shown below. Vitals:   09/24/21 2045 09/24/21 2100 09/24/21 2130 09/24/21 2200  BP:   131/75 (!) 147/92  Pulse: 100 96 93 94  Resp:   18   Temp:      SpO2: 97% 100% 100% 100%  Weight:      Height:          WBC     Component Value Date/Time   WBC 5.2 09/24/2021 2037   LYMPHSABS 1.6 09/24/2021 2037   MONOABS 0.7 09/24/2021 2037   EOSABS 0.1 09/24/2021 2037   BASOSABS 0.0 09/24/2021 2037      UA  ordered     Results for orders placed or performed during the hospital encounter of 04/07/21  Resp Panel by RT-PCR (Flu A&B, Covid) Nasopharyngeal Swab     Status: None   Collection Time: 04/08/21 12:27 AM   Specimen: Nasopharyngeal Swab; Nasopharyngeal(NP) swabs in vial transport medium  Result Value Ref Range Status   SARS Coronavirus 2 by RT PCR NEGATIVE NEGATIVE Final    Comment: (NOTE) SARS-CoV-2 target nucleic acids are NOT DETECTED.  The SARS-CoV-2 RNA is generally detectable in upper respiratory specimens during the acute phase of infection. The lowest concentration of SARS-CoV-2 viral copies this assay can detect is 138 copies/mL. A negative result does not preclude SARS-Cov-2 infection and should not be used as the sole basis for treatment or other patient management decisions. A negative result may occur with  improper specimen collection/handling, submission of specimen other than  nasopharyngeal swab, presence of viral mutation(s) within the areas targeted by this assay, and inadequate number of viral copies(<138 copies/mL). A negative result must be combined with clinical observations, patient history, and epidemiological information. The expected result is Negative.  Fact Sheet for Patients:  EntrepreneurPulse.com.au  Fact Sheet for Healthcare Providers:  IncredibleEmployment.be  This test is no t yet approved or cleared by the [place] FDA and  has been authorized for detection and/or diagnosis of SARS-CoV-2 by FDA under an Emergency Use Authorization (EUA). This EUA will remain  in effect (meaning this test can be used) for the duration of  the COVID-19 declaration under Section 564(b)(1) of the Act, 21 U.S.C.section 360bbb-3(b)(1), unless the authorization is terminated  or revoked sooner.       Influenza A by PCR NEGATIVE NEGATIVE Final   Influenza B by PCR NEGATIVE NEGATIVE Final    Comment: (NOTE) The Xpert Xpress SARS-CoV-2/FLU/RSV plus assay is intended as an aid in the diagnosis of influenza from Nasopharyngeal swab specimens and should not be used as a sole basis for treatment. Nasal washings and aspirates are unacceptable for Xpert Xpress SARS-CoV-2/FLU/RSV testing.  Fact Sheet for Patients: EntrepreneurPulse.com.au  Fact Sheet for Healthcare Providers: IncredibleEmployment.be  This test is not yet approved or cleared by the [place] FDA and has been authorized for detection and/or diagnosis of SARS-CoV-2 by FDA under an Emergency Use Authorization (EUA). This EUA will remain in effect (meaning this test can be used) for the duration of the COVID-19 declaration under Section 564(b)(1) of the Act, 21 U.S.C. section 360bbb-3(b)(1), unless the authorization is terminated or revoked.  Performed at [facility], [place] [address] [place], [place] [address]   SARS CORONAVIRUS 2 (TAT 6-24 HRS) Nasopharyngeal Nasopharyngeal Swab     Status: None   Collection Time: 05/09/21  3:35 PM   Specimen: Nasopharyngeal Swab  Result Value Ref Range Status   SARS Coronavirus 2 NEGATIVE NEGATIVE Final    Comment: (NOTE) SARS-CoV-2 target nucleic acids are NOT DETECTED.  The SARS-CoV-2 RNA is generally detectable in upper and lower respiratory specimens during the acute phase of infection. Negative results do not preclude SARS-CoV-2 infection, do not rule out co-infections with other pathogens, and should not be used as the sole basis for treatment or other patient management decisions. Negative results must be combined with clinical observations, patient history, and epidemiological information. The expected result is Negative.  Fact Sheet for Patients: SugarRoll.be  Fact Sheet for Healthcare Providers: https://www.woods-mathews.com/  This test is not yet approved or cleared by the [place] FDA and  has been authorized for detection and/or diagnosis of SARS-CoV-2 by FDA under an Emergency Use Authorization (EUA). This EUA will remain  in effect (meaning this test can be used) for the duration of the COVID-19 declaration under Se ction 564(b)(1) of the Act, 21 U.S.C. section 360bbb-3(b)(1), unless the authorization is terminated or revoked sooner.  Performed at [facility], [place] [address] [place], [place] [address]   Resp Panel by RT-PCR (Flu A&B, Covid) Nasopharyngeal Swab     Status: None   Collection Time: 05/10/21  2:36 PM   Specimen: Nasopharyngeal Swab; Nasopharyngeal(NP) swabs in vial transport medium  Result Value Ref Range Status   SARS Coronavirus 2 by RT PCR NEGATIVE NEGATIVE Final    Comment: (NOTE) SARS-CoV-2 target nucleic acids are NOT DETECTED.  The SARS-CoV-2 RNA is generally detectable in upper respiratory specimens during the acute phase of  infection. The lowest concentration of SARS-CoV-2 viral copies this assay can detect is 138 copies/mL. A negative result does not preclude SARS-Cov-2 infection and should not be used as the sole basis for treatment or other patient management decisions. A negative result may occur with  improper specimen collection/handling, submission of specimen other than nasopharyngeal swab, presence of viral mutation(s) within the areas targeted by this assay, and inadequate number of viral copies(<138 copies/mL). A negative result must be combined with clinical observations, patient history, and epidemiological information. The expected result is Negative.  Fact Sheet for Patients:  EntrepreneurPulse.com.au  Fact Sheet for Healthcare Providers:  IncredibleEmployment.be  This test is no t yet approved or cleared by the [place] and  has been authorized for detection and/or diagnosis of SARS-CoV-2 by FDA under an Emergency Use Authorization (EUA). This EUA will remain  in effect (meaning this test can be used) for the duration of the COVID-19 declaration under Section 564(b)(1) of the Act, 21 U.S.C.section 360bbb-3(b)(1), unless the authorization is terminated  or revoked sooner.       Influenza A by PCR NEGATIVE NEGATIVE Final   Influenza B by PCR NEGATIVE NEGATIVE Final    Comment: (NOTE) The Xpert Xpress SARS-CoV-2/FLU/RSV plus assay is intended as an aid in the diagnosis of influenza from Nasopharyngeal swab specimens and should not be used as a sole basis for treatment. Nasal washings and aspirates are unacceptable for Xpert Xpress SARS-CoV-2/FLU/RSV testing.  Fact Sheet for Patients: EntrepreneurPulse.com.au  Fact Sheet for Healthcare Providers: IncredibleEmployment.be  This test is not yet approved or cleared by the [place] FDA and has been authorized for detection and/or diagnosis of SARS-CoV-2  by FDA under an Emergency Use Authorization (EUA). This EUA will remain in effect (meaning this test can be used) for the duration of the COVID-19 declaration under Section 564(b)(1) of the Act, 21 U.S.C. section 360bbb-3(b)(1), unless the authorization is terminated or revoked.  Performed at [facility], [place] [address] [place], [place] [address]      _______________________________________________ Hospitalist was called for admission for AKi and metastatic spread   The following Work up has been ordered so far:  Orders Placed This Encounter  Procedures   CT CHEST ABDOMEN PELVIS WO CONTRAST   CT Soft Tissue Neck Wo Contrast   CT HEAD WO CONTRAST (5MM)   CBC with Differential   Comprehensive metabolic panel   Urinalysis, Routine w reflex microscopic   Consult to hospitalist     OTHER Significant initial  Findings:  labs showing:    Recent Labs  Lab 09/24/21 2037  NA 139  K 4.3  CO2 22  GLUCOSE 84  BUN 50*  CREATININE 4.56*  CALCIUM 9.1    Cr   Up from baseline see below Lab Results  Component Value Date   CREATININE 4.56 (H) 09/24/2021   CREATININE 2.62 (H) 05/07/2021   CREATININE 2.29 (H) 05/02/2021    Recent Labs  Lab 09/24/21 2037  AST 20  ALT 8  ALKPHOS 94  BILITOT 0.4  PROT 7.8  ALBUMIN 3.2*   Lab Results  Component Value Date   CALCIUM 9.1 09/24/2021   PHOS 3.9 03/12/2021       Plt: Lab Results  Component Value Date   PLT 264 09/24/2021    COVID-19 Labs  No results for input(s): "DDIMER", "FERRITIN", "LDH", "CRP" in the last 72 hours.  Lab Results  Component Value Date   SARSCOV2NAA NEGATIVE 05/10/2021   SARSCOV2NAA NEGATIVE 05/09/2021   SARSCOV2NAA NEGATIVE 04/08/2021   [place] NEGATIVE 03/11/2021       Recent Labs  Lab 09/24/21 2037  WBC 5.2  NEUTROABS 2.7  HGB 8.4*  HCT 27.4*  MCV 102.6*  PLT 264    HG/HCT  stable     Component Value Date/Time   HGB 8.4 (L) 09/24/2021 2037   HGB 9.4  (L) 06/18/2020 1126   HCT 27.4 (L) 09/24/2021 2037   MCV 102.6 (H) 09/24/2021 2037         Cultures:    Component Value Date/Time   SDES  08/15/2020 1136    Lung, Left Performed  at [facility], [place] [address], [place], [place] [address]    SPECREQUEST  08/15/2020 1136    NONE Performed at [facility], [place] [address], [place], [place] [address]    CULT  08/15/2020 1136    NO GROWTH 3 DAYS Performed at [place] [address], [place], [place] [address]    REPTSTATUS 08/18/2020 FINAL 08/15/2020 1136     Radiological Exams on Admission: CT Soft Tissue Neck Wo Contrast  Result Date: 09/24/2021 CLINICAL DATA:  Left neck swelling.  History of lung carcinoma. EXAM: CT NECK WITHOUT CONTRAST TECHNIQUE: Multidetector CT imaging of the neck was performed following the standard protocol without intravenous contrast. RADIATION DOSE REDUCTION: This exam was performed according to the departmental dose-optimization program which includes automated exposure control, adjustment of the mA and/or kV according to patient size and/or use of iterative reconstruction technique. COMPARISON:  None Available. FINDINGS: Pharynx and larynx: Normal. No mass or swelling. Salivary glands: No inflammation, mass, or stone. Thyroid: Normal. Lymph nodes: There is a trans spatial left facial mass that abuts the inferior aspect of the left masseter muscle and measures 2.5 x 2.2 cm. There is a 2.2 cm left level 5A lymph node (image 55). Vascular: Unremarkable unenhanced appearance Limited intracranial: Negative. Visualized orbits: Negative. Mastoids and visualized paranasal sinuses: Bilateral maxillary sinus mucosal thickening. Small mastoid effusions. Skeleton: No acute or aggressive process. Upper chest: 17 mm nodule in the right upper lobe, more completely evaluated on concomitant chest CT. Other: None IMPRESSION: 1. Transspatial left facial mass that abuts the inferior  aspect of the left masseter muscle, most consistent with metastatic disease. 2. A 2.2 cm left level 5A lymph node. 3. A 17 mm nodule in the right upper lobe, more completely evaluated on concomitant chest CT. Electronically Signed   By: Ulyses Jarred M.D.   On: 09/24/2021 21:31   CT CHEST ABDOMEN PELVIS WO CONTRAST  Result Date: 09/24/2021 CLINICAL DATA:  Metastatic disease evaluation known lung cancer has renal failure. Noticed lump on abdomen and upper chest r/o mass EXAM: CT CHEST, ABDOMEN AND PELVIS WITHOUT CONTRAST TECHNIQUE: Multidetector CT imaging of the chest, abdomen and pelvis was performed following the standard protocol without IV contrast. RADIATION DOSE REDUCTION: This exam was performed according to the departmental dose-optimization program which includes automated exposure control, adjustment of the mA and/or kV according to patient size and/or use of iterative reconstruction technique. COMPARISON:  CT chest, abdomen, pelvis 03/11/2021 FINDINGS: CT CHEST FINDINGS Right chest wall Port-A-Cath with tip terminating in the right atrium. Cardiovascular: Normal heart size. No significant pericardial effusion. The thoracic aorta is normal in caliber. No atherosclerotic plaque of the thoracic aorta. At least 2 vessel coronary artery calcifications. Mediastinum/Nodes: No gross hilar adenopathy, noting limited sensitivity for the detection of hilar adenopathy on this noncontrast study. No enlarged mediastinal or axillary lymph nodes. Thyroid gland, trachea, and esophagus demonstrate no significant findings. Lungs/Pleura: No focal consolidation. Interval increase in size of a 3.8 x 4.2 cm right upper lobe mass with interval increase in size of a satellite nodule adjacent to it measuring 1.7 cm. Slight interval increase in size of a subpleural spiculated right lower lobe nodule measuring 1.5 x 1.5 cm (2:15). No right pleural effusion. Likely loculated at least moderate volume left pleural effusion. Query  underlying left lower lobe mass lesion. No pneumothorax. Musculoskeletal: Multiple subcutaneus soft tissue nodules and masses with as an example a left breast 3.5 x 3.6 cm lesion (2:27).  No suspicious lytic or blastic osseous lesions. No acute displaced fracture. Multilevel degenerative changes of the spine. CT ABDOMEN PELVIS FINDINGS Hepatobiliary: No focal liver abnormality. Calcified gallstone noted within the gallbladder lumen. No gallbladder wall thickening or pericholecystic fluid. No biliary dilatation. Pancreas: No focal lesion. Normal pancreatic contour. No surrounding inflammatory changes. No main pancreatic ductal dilatation. Spleen: Normal in size without focal abnormality. Adrenals/Urinary Tract: 3.3 x 2.2 cm right adrenal mass.  No left adrenal nodularity. No nephrolithiasis. Moderate right hydronephrosis with likely obstruction from mass lesion noted along the renal pelvis / hilum measuring 3.2 x 3.1 cm. No left hydronephrosis. Couple bilateral renal masses with as an example a 3.3 x 1.6 cm left lesion along the superior pole of the left kidney (2:58). Some of these may represent peritoneal implants adjacent to the kidneys with difficulty evaluating these on noncontrast study. No ureterolithiasis or hydroureter. The urinary bladder is unremarkable. Stomach/Bowel: Stomach is within normal limits. No evidence of bowel wall thickening or dilatation. Appendix appears normal. Vascular/Lymphatic: No abdominal aorta or iliac aneurysm. Moderate atherosclerotic plaque of the aorta and its branches. No abdominal, pelvic, or inguinal lymphadenopathy. Reproductive: Uterus and bilateral adnexa are unremarkable. Other: Right upper quadrant peritoneal implant measuring 2.3 x 2 cm (2:53). No intraperitoneal free fluid. No intraperitoneal free gas. No organized fluid collection. Musculoskeletal: Multiple subcutaneus soft tissue nodules and masses with as an example a anterior abdominal wall 4.3 x 3.1 cm (2:73).  Ventral hernia containing fat with an abdominal defect of the defect of 2.4 cm and a subjacent hernia with an abdominal defect of 3.2 cm. No suspicious lytic or blastic osseous lesions. No acute displaced fracture. Multilevel degenerative changes of the spine. IMPRESSION: 1. Findings consistent with worsening metastatic disease. Markedly limited evaluation on this noncontrast study. 2. Moderate right hydronephrosis with likely obstruction from mass lesion noted along the renal pelvis/hilum measuring 3.2 x 3.1 cm. 3. Interval increase in size of the right upper lobe dominant mass, adjacent right upper lobe satellite nodule, and known right lower lobe subpleural nodule. 4. Similar-appearing likely loculated at least moderate volume left pleural effusion. Query underlying left lower lobe mass lesion. 5. Interval development of bilateral renal masses/metastases. 6. Interval development of right adrenal metastasis. 7. Interval development of peritoneal implant/lesions. 8. Interval development of multiple subcutaneus soft tissue metastases. 9. Cholelithiasis with no CT evidence of acute cholecystitis. 10. Fat containing ventral wall hernias. 11.  Aortic Atherosclerosis (ICD10-I70.0). Electronically Signed   By: Iven Finn M.D.   On: 09/24/2021 21:23   CT HEAD WO CONTRAST (5MM)  Result Date: 09/24/2021 CLINICAL DATA:  Brain metastasis suspected, headache. History of lung cancer. EXAM: CT HEAD WITHOUT CONTRAST TECHNIQUE: Contiguous axial images were obtained from the base of the skull through the vertex without intravenous contrast. RADIATION DOSE REDUCTION: This exam was performed according to the departmental dose-optimization program which includes automated exposure control, adjustment of the mA and/or kV according to patient size and/or use of iterative reconstruction technique. COMPARISON:  04/17/2021. FINDINGS: Brain: Diffuse atrophy is noted. Periventricular white matter hypodensities are noted bilaterally.  No hydrocephalus. There is redemonstration of a cystic lesion in the parietal lobe on the right measuring 3.2 x 2.2 cm with associated mass effect, decreased in size from the prior exam. A 1.1 cm lesion is noted in the frontal lobe anteriorly with no significant mass effect and smaller as compared with the prior exam. Both lesions have a small thin hyperdense rim inferiorly in the possibility of blood products can  not be excluded. No definite new lesions are seen. Evaluation for metastatic disease is limited due to lack of IV contrast. No midline shift. Vascular: Atherosclerotic calcification of the carotid siphons. No hyperdense vessel. Skull: Normal. Negative for fracture or focal lesion. Sinuses/Orbits: There is partial opacification of the right maxillary sinus and left sphenoid sinus. The orbits are within normal limits. Other: There is partial opacification of the mastoid air cells bilaterally. IMPRESSION: 1. Known cystic metastatic lesions in the parietal and frontal lobes on the right are decreased in size from the prior exam with reduced mass effect. A thin hyperdense rim is noted in both lesions inferiorly in the possibility of blood products can not be completely excluded. MRI is recommended for further evaluation. 2. Atrophy with chronic microvascular ischemic changes. Electronically Signed   By: Brett Fairy M.D.   On: 09/24/2021 21:20   _______________________________________________________________________________________________________ Latest  Blood pressure (!) 147/92, pulse 94, temperature 97.8 F (36.6 C), resp. rate 18, height 5\' 6"  (1.676 m), weight 46 kg, last menstrual period 01/19/2011, SpO2 100 %.   Vitals  labs and radiology finding personally reviewed  Review of Systems:    Pertinent positives include:   fatigue,  neck swelling, shortness of breath at rest.  Constitutional:  No weight loss, night sweats, Fevers, chills,weight loss  HEENT:  No headaches, Difficulty  swallowing,Tooth/dental problems,Sore throat,  No sneezing, itching, ear ache, nasal congestion, post nasal drip,  Cardio-vascular:  No chest pain, Orthopnea, PND, anasarca, dizziness, palpitations.no Bilateral lower extremity swelling  GI:  No heartburn, indigestion, abdominal pain, nausea, vomiting, diarrhea, change in bowel habits, loss of appetite, melena, blood in stool, hematemesis Resp:  no  No dyspnea on exertion, No excess mucus, no productive cough, No non-productive cough, No coughing up of blood.No change in color of mucus.No wheezing. Skin:  no rash or lesions. No jaundice GU:  no dysuria, change in color of urine, no urgency or frequency. No straining to urinate.  No flank pain.  Musculoskeletal:  No joint pain or no joint swelling. No decreased range of motion. No back pain.  Psych:  No change in mood or affect. No depression or anxiety. No memory loss.  Neuro: no localizing neurological complaints, no tingling, no weakness, no double vision, no gait abnormality, no slurred speech, no confusion  All systems reviewed and apart from Sharon all are negative _______________________________________________________________________________________________ Past Medical History:   Past Medical History:  Diagnosis Date   Breast cancer ([place])    Depression    Mental disorder       Past Surgical History:  Procedure Laterality Date   ESOPHAGOGASTRODUODENOSCOPY (EGD) WITH PROPOFOL N/A 04/09/2020   Procedure: ESOPHAGOGASTRODUODENOSCOPY (EGD) WITH PROPOFOL;  Surgeon: Doran Stabler, MD;  Location: [facility];  Service: Gastroenterology;  Laterality: N/A;   IR IMAGING GUIDED PORT INSERTION  11/03/2019   TUBAL LIGATION     VIDEO BRONCHOSCOPY WITH ENDOBRONCHIAL NAVIGATION N/A 04/04/2019   Procedure: VIDEO BRONCHOSCOPY WITH ENDOBRONCHIAL NAVIGATION;  Surgeon: Garner Nash, DO;  Location: [place];  Service: Thoracic;  Laterality: N/A;   VIDEO BRONCHOSCOPY WITH ENDOBRONCHIAL  ULTRASOUND N/A 04/04/2019   Procedure: VIDEO BRONCHOSCOPY WITH ENDOBRONCHIAL ULTRASOUND;  Surgeon: Garner Nash, DO;  Location: [facility];  Service: Thoracic;  Laterality: N/A;    Social History:  Ambulatory   independently       reports that she quit smoking about 2 years ago. Her smoking use included cigarettes. She has a 15.00 pack-year smoking history. She has never used  smokeless tobacco. She reports current alcohol use of about 84.0 standard drinks of alcohol per week. She reports that she does not currently use drugs after having used the following drugs: Cocaine.   Family History:   Family History  Problem Relation Age of Onset   Cancer Mother    Heart attack Father        thinks he had MI in his 2s; died from saw accident    ______________________________________________________________________________________________ Allergies: Allergies  Allergen Reactions   Aspirin Adult Low [Aspirin] Other (See Comments)    Stomach upset     Prior to Admission medications   Medication Sig Start Date End Date Taking? Authorizing Provider  acetaminophen (TYLENOL) 325 MG tablet Take 2 tablets (650 mg total) by mouth every 6 (six) hours as needed for mild pain, moderate pain or headache. Patient not taking: Reported on 03/12/2021 04/11/20   Domenic Polite, MD  bisacodyl (DULCOLAX) 10 MG suppository Place 1 suppository (10 mg total) rectally daily as needed for moderate constipation. 05/10/21   Florencia Reasons, MD  carvedilol (COREG) 3.125 MG tablet Take 1 tablet (3.125 mg total) by mouth 2 (two) times daily. 05/10/21   Florencia Reasons, MD  feeding supplement (ENSURE ENLIVE / ENSURE PLUS) LIQD Take 237 mLs by mouth 2 (two) times daily between meals. 05/10/21   Florencia Reasons, MD  ferrous sulfate 325 (65 FE) MG tablet Take 1 tablet (325 mg total) by mouth daily with breakfast. 05/11/21   Florencia Reasons, MD  folic acid (FOLVITE) 1 MG tablet TAKE 1 TABLET (1 MG TOTAL) BY MOUTH DAILY. 05/10/21 05/10/22  Florencia Reasons, MD   gabapentin (NEURONTIN) 300 MG capsule Take 1 capsule (300 mg total) by mouth at bedtime. Patient not taking: Reported on 03/12/2021 06/11/20   Harle Stanford., PA-C  levETIRAcetam (KEPPRA) 100 MG/ML solution Take 5 mLs (500 mg total) by mouth 2 (two) times daily. 05/10/21   Florencia Reasons, MD  LORazepam (ATIVAN) 0.5 MG tablet Take 1 tablet (0.5 mg total) by mouth every 6 (six) hours as needed for anxiety. 05/10/21   Florencia Reasons, MD  Multiple Vitamin (MULTIVITAMIN WITH MINERALS) TABS tablet Take 1 tablet by mouth daily. 05/11/21   Florencia Reasons, MD  oxycodone (OXY-IR) 5 MG capsule Take 1 capsule (5 mg total) by mouth every 6 (six) hours. 06/25/21   Reed, Tiffany L, DO  oxyCODONE (ROXICODONE) 5 MG/5ML solution Take 5 mLs (5 mg total) by mouth every 4 (four) hours as needed for severe pain. 05/10/21   Florencia Reasons, MD  pantoprazole (PROTONIX) 40 MG tablet Take 1 tablet (40 mg total) by mouth daily. 05/11/21   Florencia Reasons, MD  polyethylene glycol (MIRALAX / GLYCOLAX) 17 g packet Dissolve 1 packet (17 g) in liquid and drink 2 (two) times daily. Patient not taking: Reported on 03/12/2021 07/26/20   Arrien, Jimmy Picket, MD  sennosides-docusate sodium (SENOKOT-S) 8.6-50 MG tablet Take 2 tablets by mouth daily. 06/25/21   Reed, Tiffany L, DO  thiamine 100 MG tablet Take 1 tablet (100 mg total) by mouth daily. Patient not taking: Reported on 03/12/2021 08/16/20   Eugenie Filler, MD  vitamin B-12 (CYANOCOBALAMIN) 500 MCG tablet Take 1 tablet (500 mcg total) by mouth daily. 05/11/21   Florencia Reasons, MD  prochlorperazine (COMPAZINE) 10 MG tablet Take 1 tablet (10 mg total) by mouth every 6 (six) hours as needed for nausea or vomiting. Patient not taking: Reported on 04/08/2020 01/17/20 04/11/20  Heilingoetter, Cassandra L, PA-C    ___________________________________________________________________________________________________ Physical  Exam:    09/24/2021   10:00 PM 09/24/2021    9:30 PM 09/24/2021    9:00 PM  Vitals with BMI  Systolic 161  096   Diastolic 92 75   Pulse 94 93 96     1. General:  in No  Acute distress   Chronically ill   -appearing 2. Psychological: Alert and   Oriented to self not situation 3. Head/ENT:    Dry Mucous Membranes severe lymphadenopathy and swelling worse on the left side of face                          Head Non traumatic, neck supple                          Poor Dentition 4. SKIN: decreased Skin turgor,  Skin clean Dry and intact no rash 5. Heart: Regular rate and rhythm no  Murmur, no Rub or gallop 6. Lungs:  no wheezes some crackles decreased breath sounds worse on the left 7. Abdomen: Soft,  non-tender, Non distended  bowel sounds present firm metastatic nodules present Breast exam noted from large nodule present in the left breast, with right nipple retraction 8. Lower extremities: no clubbing, cyanosis, no  edema 9. Neurologically Grossly intact, moving all 4 extremities equally   10. MSK: Normal range of motion    Chart has been reviewed  ______________________________________________________________________________________________  Assessment/Plan  61 y.o. 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 She does report multiple swellings in her face and her chest and her breast and her abdomen Imaging consistent with multiple metastases widespread most significant involving kidneys favoring hydronephrosis unlikely resulting in AKI but there is also brain metastases as well Family wants aggressive treatment Discussed with them at length that aggressive treatment may actually make the patient feel worse short-term and probably not curative Family would like to discuss this with oncology sent msg  ordered as a palliative care consult as well   Stage 3b chronic kidney disease (CKD) ([place]) Mild worsening renal failure most likely secondary to hydronephrosis secondary to tumor burden. We will need to have further discussion with oncology regarding overall goals of care and what could be attempted  Seizure-like activity ([place]) Currently stable continue Keppra 500 mg twice daily for prophylaxis   Protein-calorie malnutrition, severe Check prealbumin order nutritional consult make sure patient is on thiamine  Primary malignant neoplasm of lung with metastasis to brain [facility]) Imaging showing widespread metastatic disease.  Emailed oncology appreciate  their involvement family requests second opinion and seems like in the past they were told that patient would only qualify for palliative radiation therapy which was eventually discontinued as well Patient may have had trouble with noncompliance in the past  Pleural effusion Chronic loculated currently nonhypoxic continue to monitor  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 ([place]) Check CD4 count and HIV viral load HIV screen was reactive back in 2021 but has been nonreactive since with negative HIV viral load and normal CD4 count.  Suspect this was a false positive test Will  recheck  Chronic combined systolic and diastolic congestive heart failure (HCC) Currently stable euvolemic  Anemia of chronic disease Chronic obtain anemia panel transfuse as needed for hemoglobin approaching 7 or symptomatic anemia  AKI (acute kidney injury) ([place]) In the setting of metastatic disease involving kidneys most likely causes hydronephrosis.  Secondary to large pelvic tumor burden Appreciate oncology consult regarding goals of care discussion and plan Unclear if patient would be candidate for radiation therapy versus chemotherapy versus immunotherapy  Breast mass Unclear if another primary versus skin metastases location to the breast is somewhat worrisome for second primary will defer to oncology if patient would benefit from biopsy   Other plan as per orders.  DVT prophylaxis:  SCD      Code Status:    Code Status: Prior FULL CODE as per patient  and family  I had personally discussed CODE STATUS with patient and family    Family Communication:   Family not at  Bedside  plan of care was discussed on the phone with niece  Disposition Plan:                              Back to current facility when stable                              Following barriers for discharge:                                                          Pain controlled with PO medications                                                           Will need consultants to evaluate patient prior to discharge                       Would benefit from PT/OT eval prior to DC  Ordered                                      Transition of care consulted                   Nutrition    consulted  Palliative care    consulted                                      Consults called:  emailed oncology   Admission status:  ED Disposition     ED Disposition  Admit   Condition  --   [place]: [place] [[id number]]  Level of Care:  Telemetry [5]  Admit to tele based on following criteria: Other see comments  Comments: aki  May place patient in observation at [facility] or [facility] if equivalent level of care is available:: No  Covid Evaluation: Asymptomatic - no recent exposure (last 10 days) testing not required  Diagnosis: AKI (acute kidney injury) [facility]) [[id number]]  Admitting Physician: Toy Baker [[id number]]  Attending Physician: Toy Baker [[id number]]           Obs    Level of care     tele  For 12H     Juliet Vasbinder 09/24/2021, 11:33 PM    [facility]     after 2 AM please page floor coverage PA If 7AM-7PM, please contact the day team taking care of the patient using [email or web address]   Patient was evaluated in the context of the global COVID-19 pandemic, which necessitated consideration that the patient might be at risk for infection with the SARS-CoV-2 virus that causes COVID-19. Institutional protocols and algorithms that pertain to the evaluation of patients at risk for COVID-19 are in a state of rapid change based on information released by regulatory bodies including the CDC and federal and state organizations. These policies and algorithms were followed during the patient's care.    Michelene Heady is tolerating

## 2021-09-24 NOTE — Assessment & Plan Note (Signed)
Patient's family states that she is now has will to live to pursue more aggressive therapy patient herself is pleasantly confused seems like she is having hard time making her own decisions. She does report multiple swellings in her face and her chest and her breast and her abdomen Imaging consistent with multiple metastases widespread most significant involving kidneys favoring hydronephrosis unlikely resulting in AKI but there is also brain metastases as well Family wants aggressive treatment Discussed with them at length that aggressive treatment may actually make the patient feel worse short-term and probably not curative Family would like to discuss this with oncology sent msg  ordered as a palliative care consult as well

## 2021-09-24 NOTE — Assessment & Plan Note (Signed)
Imaging showing widespread metastatic disease.  Emailed oncology appreciate their involvement family requests second opinion and seems like in the past they were told that patient would only qualify for palliative radiation therapy which was eventually discontinued as well Patient may have had trouble with noncompliance in the past

## 2021-09-24 NOTE — Assessment & Plan Note (Signed)
Chronic loculated currently nonhypoxic continue to monitor

## 2021-09-24 NOTE — Assessment & Plan Note (Signed)
Currently stable continue Keppra 500 mg twice daily for prophylaxis

## 2021-09-24 NOTE — Subjective & Objective (Signed)
Coming from [place] with facial and neck swelling started yesterday No difficulty breathing Patient with history of lung cancer metastasis to the brain Noted to have significant lymph node swelling around the neck region Also tender lump on the abdomen for few weeks Patient is currently receiving palliative radiation No fevers no chills no cough On Keppra for prophylaxis of seizures

## 2021-09-24 NOTE — Assessment & Plan Note (Signed)
chronic stable ?

## 2021-09-24 NOTE — Assessment & Plan Note (Addendum)
Check CD4 count and HIV viral load HIV screen was reactive back in 2021 but has been nonreactive since with negative HIV viral load and normal CD4 count.  Suspect this was a false positive test Will recheck

## 2021-09-24 NOTE — Assessment & Plan Note (Signed)
Currently stable euvolemic

## 2021-09-24 NOTE — Assessment & Plan Note (Signed)
Chronic obtain anemia panel transfuse as needed for hemoglobin approaching 7 or symptomatic anemia

## 2021-09-24 NOTE — Assessment & Plan Note (Signed)
Unclear if another primary versus skin metastases location to the breast is somewhat worrisome for second primary will defer to oncology if patient would benefit from biopsy

## 2021-09-25 DIAGNOSIS — C772 Secondary and unspecified malignant neoplasm of intra-abdominal lymph nodes: Secondary | ICD-10-CM | POA: Diagnosis present

## 2021-09-25 DIAGNOSIS — N179 Acute kidney failure, unspecified: Secondary | ICD-10-CM | POA: Diagnosis present

## 2021-09-25 DIAGNOSIS — C7971 Secondary malignant neoplasm of right adrenal gland: Secondary | ICD-10-CM | POA: Diagnosis present

## 2021-09-25 DIAGNOSIS — Z87891 Personal history of nicotine dependence: Secondary | ICD-10-CM | POA: Diagnosis not present

## 2021-09-25 DIAGNOSIS — Z20822 Contact with and (suspected) exposure to covid-19: Secondary | ICD-10-CM | POA: Diagnosis present

## 2021-09-25 DIAGNOSIS — N131 Hydronephrosis with ureteral stricture, not elsewhere classified: Secondary | ICD-10-CM | POA: Diagnosis present

## 2021-09-25 DIAGNOSIS — Z923 Personal history of irradiation: Secondary | ICD-10-CM | POA: Diagnosis not present

## 2021-09-25 DIAGNOSIS — Z8249 Family history of ischemic heart disease and other diseases of the circulatory system: Secondary | ICD-10-CM | POA: Diagnosis not present

## 2021-09-25 DIAGNOSIS — N135 Crossing vessel and stricture of ureter without hydronephrosis: Secondary | ICD-10-CM | POA: Diagnosis not present

## 2021-09-25 DIAGNOSIS — Z79899 Other long term (current) drug therapy: Secondary | ICD-10-CM | POA: Diagnosis not present

## 2021-09-25 DIAGNOSIS — D631 Anemia in chronic kidney disease: Secondary | ICD-10-CM | POA: Diagnosis present

## 2021-09-25 DIAGNOSIS — Z809 Family history of malignant neoplasm, unspecified: Secondary | ICD-10-CM | POA: Diagnosis not present

## 2021-09-25 DIAGNOSIS — D630 Anemia in neoplastic disease: Secondary | ICD-10-CM | POA: Diagnosis not present

## 2021-09-25 DIAGNOSIS — C7989 Secondary malignant neoplasm of other specified sites: Secondary | ICD-10-CM | POA: Diagnosis present

## 2021-09-25 DIAGNOSIS — G9341 Metabolic encephalopathy: Secondary | ICD-10-CM | POA: Diagnosis present

## 2021-09-25 DIAGNOSIS — I5042 Chronic combined systolic (congestive) and diastolic (congestive) heart failure: Secondary | ICD-10-CM | POA: Diagnosis present

## 2021-09-25 DIAGNOSIS — Z7189 Other specified counseling: Secondary | ICD-10-CM | POA: Diagnosis not present

## 2021-09-25 DIAGNOSIS — C3491 Malignant neoplasm of unspecified part of right bronchus or lung: Secondary | ICD-10-CM | POA: Diagnosis present

## 2021-09-25 DIAGNOSIS — E440 Moderate protein-calorie malnutrition: Secondary | ICD-10-CM | POA: Diagnosis present

## 2021-09-25 DIAGNOSIS — F32A Depression, unspecified: Secondary | ICD-10-CM | POA: Diagnosis present

## 2021-09-25 DIAGNOSIS — N1832 Chronic kidney disease, stage 3b: Secondary | ICD-10-CM | POA: Diagnosis present

## 2021-09-25 DIAGNOSIS — Z681 Body mass index (BMI) 19 or less, adult: Secondary | ICD-10-CM | POA: Diagnosis not present

## 2021-09-25 DIAGNOSIS — R443 Hallucinations, unspecified: Secondary | ICD-10-CM | POA: Diagnosis not present

## 2021-09-25 DIAGNOSIS — C7931 Secondary malignant neoplasm of brain: Secondary | ICD-10-CM | POA: Diagnosis present

## 2021-09-25 DIAGNOSIS — C3490 Malignant neoplasm of unspecified part of unspecified bronchus or lung: Secondary | ICD-10-CM | POA: Diagnosis not present

## 2021-09-25 DIAGNOSIS — C799 Secondary malignant neoplasm of unspecified site: Secondary | ICD-10-CM | POA: Diagnosis not present

## 2021-09-25 DIAGNOSIS — I130 Hypertensive heart and chronic kidney disease with heart failure and stage 1 through stage 4 chronic kidney disease, or unspecified chronic kidney disease: Secondary | ICD-10-CM | POA: Diagnosis present

## 2021-09-25 DIAGNOSIS — Z21 Asymptomatic human immunodeficiency virus [HIV] infection status: Secondary | ICD-10-CM | POA: Diagnosis present

## 2021-09-25 DIAGNOSIS — Z66 Do not resuscitate: Secondary | ICD-10-CM | POA: Diagnosis present

## 2021-09-25 DIAGNOSIS — Z515 Encounter for palliative care: Secondary | ICD-10-CM | POA: Diagnosis not present

## 2021-09-25 LAB — RETICULOCYTES
Immature Retic Fract: 8 % (ref 2.3–15.9)
RBC.: 2.69 MIL/uL — ABNORMAL LOW (ref 3.87–5.11)
Retic Count, Absolute: 22.3 10*3/uL (ref 19.0–186.0)
Retic Ct Pct: 0.8 % (ref 0.4–3.1)

## 2021-09-25 LAB — PROTIME-INR
INR: 1 (ref 0.8–1.2)
Prothrombin Time: 12.8 seconds (ref 11.4–15.2)

## 2021-09-25 LAB — HEPATIC FUNCTION PANEL
ALT: 8 U/L (ref 0–44)
AST: 18 U/L (ref 15–41)
Albumin: 3.1 g/dL — ABNORMAL LOW (ref 3.5–5.0)
Alkaline Phosphatase: 85 U/L (ref 38–126)
Bilirubin, Direct: <0.1 mg/dL (ref 0.0–0.2)
Total Bilirubin: 0.4 mg/dL (ref 0.3–1.2)
Total Protein: 7.3 g/dL (ref 6.5–8.1)

## 2021-09-25 LAB — FERRITIN: Ferritin: 581 ng/mL — ABNORMAL HIGH (ref 11–307)

## 2021-09-25 LAB — COMPREHENSIVE METABOLIC PANEL
ALT: 8 U/L (ref 0–44)
AST: 20 U/L (ref 15–41)
Albumin: 3 g/dL — ABNORMAL LOW (ref 3.5–5.0)
Alkaline Phosphatase: 81 U/L (ref 38–126)
Anion gap: 10 (ref 5–15)
BUN: 44 mg/dL — ABNORMAL HIGH (ref 6–20)
CO2: 22 mmol/L (ref 22–32)
Calcium: 8.8 mg/dL — ABNORMAL LOW (ref 8.9–10.3)
Chloride: 109 mmol/L (ref 98–111)
Creatinine, Ser: 4.5 mg/dL — ABNORMAL HIGH (ref 0.44–1.00)
GFR, Estimated: 11 mL/min — ABNORMAL LOW (ref >60)
Glucose, Bld: 139 mg/dL — ABNORMAL HIGH (ref 70–99)
Potassium: 4.5 mmol/L (ref 3.5–5.1)
Sodium: 141 mmol/L (ref 135–145)
Total Bilirubin: 0.4 mg/dL (ref 0.3–1.2)
Total Protein: 7 g/dL (ref 6.5–8.1)

## 2021-09-25 LAB — CBC
HCT: 28 % — ABNORMAL LOW (ref 36.0–46.0)
Hemoglobin: 8.8 g/dL — ABNORMAL LOW (ref 12.0–15.0)
MCH: 32.4 pg (ref 26.0–34.0)
MCHC: 31.4 g/dL (ref 30.0–36.0)
MCV: 102.9 fL — ABNORMAL HIGH (ref 80.0–100.0)
Platelets: 279 10*3/uL (ref 150–400)
RBC: 2.72 MIL/uL — ABNORMAL LOW (ref 3.87–5.11)
RDW: 14.1 % (ref 11.5–15.5)
WBC: 4.1 10*3/uL (ref 4.0–10.5)
nRBC: 0 % (ref 0.0–0.2)

## 2021-09-25 LAB — T-HELPER CELLS (CD4) COUNT (NOT AT ARMC)
CD4 % Helper T Cell: 52 % (ref 33–65)
CD4 T Cell Abs: 685 /uL (ref 400–1790)

## 2021-09-25 LAB — IRON AND TIBC
Iron: 74 ug/dL (ref 28–170)
Saturation Ratios: 31 % (ref 10.4–31.8)
TIBC: 240 ug/dL — ABNORMAL LOW (ref 250–450)
UIBC: 166 ug/dL

## 2021-09-25 LAB — PREALBUMIN: Prealbumin: 25.3 mg/dL (ref 18–38)

## 2021-09-25 LAB — HIV ANTIBODY (ROUTINE TESTING W REFLEX): HIV Screen 4th Generation wRfx: NONREACTIVE

## 2021-09-25 LAB — VITAMIN B12: Vitamin B-12: 776 pg/mL (ref 180–914)

## 2021-09-25 LAB — PHOSPHORUS: Phosphorus: 4.1 mg/dL (ref 2.5–4.6)

## 2021-09-25 LAB — MRSA NEXT GEN BY PCR, NASAL: MRSA by PCR Next Gen: DETECTED — AB

## 2021-09-25 LAB — CK: Total CK: 36 U/L — ABNORMAL LOW (ref 38–234)

## 2021-09-25 LAB — FOLATE: Folate: >40 ng/mL (ref >5.9)

## 2021-09-25 LAB — MAGNESIUM: Magnesium: 2.4 mg/dL (ref 1.7–2.4)

## 2021-09-25 MED ORDER — ENSURE ENLIVE PO LIQD
237.0000 mL | Freq: Two times a day (BID) | ORAL | Status: DC
  Administered 2021-09-25 – 2021-09-26 (×4): 237 mL via ORAL
  Filled 2021-09-25 (×2): qty 237

## 2021-09-25 MED ORDER — FOLIC ACID 1 MG PO TABS
1.0000 mg | ORAL_TABLET | Freq: Every day | ORAL | Status: DC
  Administered 2021-09-25 – 2021-09-29 (×4): 1 mg via ORAL
  Filled 2021-09-25 (×4): qty 1

## 2021-09-25 MED ORDER — SODIUM CHLORIDE 0.9 % IV SOLN: INTRAVENOUS | Status: AC

## 2021-09-25 MED ORDER — POLYETHYLENE GLYCOL 3350 17 G PO PACK: 17.0000 g | PACK | Freq: Every day | ORAL | Status: DC | PRN

## 2021-09-25 MED ORDER — ACETAMINOPHEN 650 MG RE SUPP: 650.0000 mg | Freq: Four times a day (QID) | RECTAL | Status: DC | PRN

## 2021-09-25 MED ORDER — FERROUS SULFATE 325 (65 FE) MG PO TABS
325.0000 mg | ORAL_TABLET | Freq: Every day | ORAL | Status: DC
  Administered 2021-09-25 – 2021-09-29 (×4): 325 mg via ORAL
  Filled 2021-09-25 (×4): qty 1

## 2021-09-25 MED ORDER — LEVETIRACETAM 100 MG/ML PO SOLN
500.0000 mg | Freq: Two times a day (BID) | ORAL | Status: DC
  Administered 2021-09-25 – 2021-09-29 (×8): 500 mg via ORAL
  Filled 2021-09-25 (×9): qty 5

## 2021-09-25 MED ORDER — THIAMINE HCL 100 MG PO TABS
100.0000 mg | ORAL_TABLET | Freq: Every day | ORAL | Status: DC
  Administered 2021-09-25 – 2021-09-29 (×4): 100 mg via ORAL
  Filled 2021-09-25 (×4): qty 1

## 2021-09-25 MED ORDER — ACETAMINOPHEN 325 MG PO TABS
650.0000 mg | ORAL_TABLET | Freq: Four times a day (QID) | ORAL | Status: DC | PRN
  Administered 2021-09-28: 650 mg via ORAL
  Filled 2021-09-25: qty 2

## 2021-09-25 MED ORDER — HYDROCODONE-ACETAMINOPHEN 5-325 MG PO TABS
1.0000 | ORAL_TABLET | ORAL | Status: DC | PRN
  Administered 2021-09-25: 2 via ORAL
  Administered 2021-09-26: 1 via ORAL
  Filled 2021-09-25: qty 1
  Filled 2021-09-25 (×2): qty 2

## 2021-09-25 MED ORDER — BISACODYL 10 MG RE SUPP: 10.0000 mg | Freq: Every day | RECTAL | Status: DC | PRN

## 2021-09-25 MED ORDER — SENNA 8.6 MG PO TABS
1.0000 | ORAL_TABLET | Freq: Two times a day (BID) | ORAL | Status: DC
  Administered 2021-09-25 – 2021-09-29 (×8): 8.6 mg via ORAL
  Filled 2021-09-25 (×8): qty 1

## 2021-09-25 MED ORDER — CARVEDILOL 3.125 MG PO TABS
3.1250 mg | ORAL_TABLET | Freq: Two times a day (BID) | ORAL | Status: DC
  Administered 2021-09-25 – 2021-09-29 (×9): 3.125 mg via ORAL
  Filled 2021-09-25 (×9): qty 1

## 2021-09-25 MED ORDER — LORAZEPAM 0.5 MG PO TABS
0.5000 mg | ORAL_TABLET | Freq: Four times a day (QID) | ORAL | Status: DC | PRN
  Administered 2021-09-27: 0.5 mg via ORAL
  Filled 2021-09-25: qty 1

## 2021-09-25 MED ORDER — DOCUSATE SODIUM 100 MG PO CAPS
100.0000 mg | ORAL_CAPSULE | Freq: Two times a day (BID) | ORAL | Status: DC
  Administered 2021-09-25 – 2021-09-29 (×8): 100 mg via ORAL
  Filled 2021-09-25 (×8): qty 1

## 2021-09-25 MED ORDER — SODIUM CHLORIDE 0.9 % IV SOLN
INTRAVENOUS | Status: DC
Start: 2021-09-25 — End: 2021-09-28

## 2021-09-25 MED ORDER — OXYCODONE HCL 5 MG/5ML PO SOLN
5.0000 mg | ORAL | Status: DC | PRN
  Filled 2021-09-25: qty 5

## 2021-09-25 MED ORDER — PANTOPRAZOLE SODIUM 40 MG PO TBEC
40.0000 mg | DELAYED_RELEASE_TABLET | Freq: Every day | ORAL | Status: DC
  Administered 2021-09-25 – 2021-09-29 (×4): 40 mg via ORAL
  Filled 2021-09-25 (×4): qty 1

## 2021-09-25 NOTE — Evaluation (Signed)
Occupational Therapy Evaluation Patient Details Name: Brenda [name] MRN: [id number] DOB: 01-Mar-1961 Today's Date: 09/25/2021   History of Present Illness 61 year old black female [name]  Known stage IV lung cancer with brain metastases, received XRT + palliative radiation 02/2021 through 03/2021 and developed personality changes  HF PEF HFrEF  HIV positive no meds  CKD,  Anemia. Admitted for acute kidney injury.   Clinical Impression   Brenda [name] is a 61 year old woman who demonstrates ability to ambulate in room, in hallway and perform ADLs with min guard to supervision. She needs cues to manage IV pole/line but otherwise ambulated safely without loss of balance.  Patient reports she is at her baseline - which includes LUE weaker than right but still functional. She does reports some shoulder pain and may benefit from some OT at facility to assist with that but otherwise no OT needs.     Recommendations for follow up therapy are one component of a multi-disciplinary discharge planning process, led by the attending physician.  Recommendations may be updated based on patient status, additional functional criteria and insurance authorization.   Follow Up Recommendations  Long-term institutional care without follow-up therapy (Though may benefit from OT for shoulder pain.)    Assistance Recommended at Discharge PRN  Patient can return home with the following Assistance with cooking/housework;Direct supervision/assist for financial management;Direct supervision/assist for medications management    Functional Status Assessment  Patient has not had a recent decline in their functional status  Equipment Recommendations  None recommended by OT    Recommendations for Other Services       Precautions / Restrictions Precautions Precautions: None Precaution Comments: L shoulder pain Restrictions Weight Bearing Restrictions: No      Mobility Bed Mobility Overal bed  mobility: Independent                  Transfers Overall transfer level: Needs assistance Equipment used: None               General transfer comment: Min guard to supervision to ambulate. Needs supervision to cue her to manage IV pole. No overt loss of balance.      Balance Overall balance assessment: Mild deficits observed, not formally tested                                         ADL either performed or assessed with clinical judgement   ADL Overall ADL's : At baseline                                       General ADL Comments: Patient demonstrates ability to don socks, ambulate in room and hallway and to perform toileting task.     Vision Baseline Vision/History: 1 Wears glasses Patient Visual Report: No change from baseline       Perception     Praxis      Pertinent Vitals/Pain Pain Assessment Pain Assessment: 0-10 Pain Score: 2  Pain Location: L shoulder Pain Descriptors / Indicators: Grimacing, Aching Pain Intervention(s): Limited activity within patient's tolerance     Hand Dominance Right   Extremity/Trunk Assessment Upper Extremity Assessment Upper Extremity Assessment: RUE deficits/detail;LUE deficits/detail RUE Deficits / Details: WFL ROM, 4+/5 strength RUE Sensation: WNL RUE Coordination: WNL LUE Deficits / Details: WFL ROM,  grossly 3+/5 shoulder, 4/5 elbow, wrist 4/5, grip 3+/5 - reports shoulder pain. Has decreased external rotation - probably impingement LUE Sensation: WNL LUE Coordination: WNL   Lower Extremity Assessment Lower Extremity Assessment: Defer to PT evaluation   Cervical / Trunk Assessment Cervical / Trunk Assessment: Normal   Communication Communication Communication: No difficulties   Cognition Arousal/Alertness: Awake/alert Behavior During Therapy: WFL for tasks assessed/performed Overall Cognitive Status: Within Functional Limits for tasks assessed                                  General Comments: Able to follow commands and report on PLOF.     General Comments       Exercises     Shoulder Instructions      Home Living Family/patient expects to be discharged to:: Skilled nursing facility                                        Prior Functioning/Environment Prior Level of Function : Independent/Modified Independent             Mobility Comments: Reports not ambulating with a device at facility ADLs Comments: Reports independence with ADLs        OT Problem List: Pain      OT Treatment/Interventions:      OT Goals(Current goals can be found in the care plan section) Acute Rehab OT Goals OT Goal Formulation: All assessment and education complete, DC therapy  OT Frequency:      Co-evaluation              AM-PAC OT "6 Clicks" Daily Activity     Outcome Measure Help from another person eating meals?: None Help from another person taking care of personal grooming?: None Help from another person toileting, which includes using toliet, bedpan, or urinal?: None Help from another person bathing (including washing, rinsing, drying)?: None Help from another person to put on and taking off regular upper body clothing?: None Help from another person to put on and taking off regular lower body clothing?: None 6 Click Score: 24   End of Session Nurse Communication:  (okay to see)  Activity Tolerance: Patient tolerated treatment well Patient left: in bed;with call bell/phone within reach;with bed alarm set  OT Visit Diagnosis: Other symptoms and signs involving cognitive function                Time: 1615-1630 OT Time Calculation (min): 15 min Charges:  OT General Charges $OT Visit: 1 Visit OT Evaluation $OT Eval Low Complexity: 1 Low  Keeara Frees, OTR/[facility] [place]  Office [phone] Pager: [phone]   Lenward Chancellor 09/25/2021, 4:41 PM

## 2021-09-25 NOTE — Progress Notes (Signed)
PROGRESS NOTE   Brenda [name]  WNI:[id number] DOB: 1960-09-08 DOA: 09/24/2021 PCP: Pcp, No  Brief Narrative:   61 year old black female [name] 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 Anemia of chronic disease/renal disease Prior alcohol abuse Adult failure to thrive At baseline oriented to person only  Niece Melody Haver is HCPOA  Brought over from skilled facility with facial + neck swelling X 24 hours-on further evaluation noted tender lymph node left side of neck Work-up revealed relatively normal vitals but BUNs/creatinine up to 50/4.5 over baseline of 30/2.6 LFTs normal Hemoglobin 8.4 platelet 264 MCV 1 2 WBC 5.2 CT abdomen pelvis = metastatic disease hydronephrosis obstruction from mass lesion along right renal pelvis 3.2 X3.1 interval development of bilateral renal masses and right adrenal metastases and peritoneal implants lesions and multiple subcutaneous soft tissue mets CT head metastatic lesion parietal frontal lobes decreased in size Transfacial left facial mass abutting inferior aspect of masseter?  Metastatic disease 2 cm lymph node 17 mm right upper lobe nodule    Hospital-Problem based course  Stage IV lung cancer with brain mets and now overall facial as well as abdominal metastases Oncology has seen the patient patient is not a candidate for further therapies They recommend hospice Continuing at this time Keppra 500 twice daily Hold gabapentin secondary to risk of encephalopathy with renal insufficiency AKI likely secondary to mass effect from metastatic disease of lung tumor CKD 3B at baseline with baseline creatinine 2.5 Trial IV fluid 75 cc/H and monitor trends If the goals of care are not comfort related may  need percutaneous nephrostomy HFrEF HFpEF Continue Coreg 3.125 twice daily   DVT prophylaxis: scd Code Status: full Family Communication: called niece Melody Haver [phone] that current trajectory seems to be terminal and that this may be a discussion with oncology and palliative care-I have asked palliative care to elucidate goals of care going forward-niece seems to understand that there was no other options but wants to see if oncology has  anything else to add Disposition:  Status is: Observation The patient will require care spanning > 2 midnights and should be moved to inpatient because:   Does need discussion of goals of care   Consultants:  Oncology  Procedures:   Antimicrobials:     Subjective:  Doing fair--sitting up and eating No pain fever  Objective: Vitals:   09/25/21 0200 09/25/21 0400 09/25/21 0500 09/25/21 0600  BP: 117/76 128/82 117/79 124/87  Pulse: 87 86 86 87  Resp: 12 15 15 17   Temp:      SpO2: 100% 99% 100% 100%  Weight:      Height:       No intake or output data in the 24 hours ending 09/25/21 0650 Filed Weights   09/24/21 2030  Weight: 46 kg    Examination:  EOMI NCAT slightly swollen [place] Neck soft supple with mild swelling on the right side Significant swelling The abdomen soft cutaneously No lower extremity edema Chest clear no added sound rales rhonchi No wheeze  Data Reviewed: personally reviewed   CBC    Component Value Date/Time   WBC 5.2 09/24/2021 2037   RBC 2.69 (L) 09/24/2021 2300   RBC 2.67 (L) 09/24/2021 2037   HGB 8.4 (L) 09/24/2021 2037   HGB 9.4 (L) 06/18/2020  1126   HCT 27.4 (L) 09/24/2021 2037   PLT 264 09/24/2021 2037   PLT 248 06/18/2020 1126   MCV 102.6 (H) 09/24/2021 2037   MCH 31.5 09/24/2021 2037   MCHC 30.7 09/24/2021 2037   RDW 14.0 09/24/2021 2037   LYMPHSABS 1.6 09/24/2021 2037   MONOABS 0.7 09/24/2021 2037   EOSABS 0.1 09/24/2021 2037   BASOSABS 0.0 09/24/2021 2037       Latest Ref Rng & Units 09/24/2021   11:30 PM 09/24/2021    8:37 PM 05/07/2021    1:17 PM  CMP  Glucose 70 - 99 mg/dL  84  103   BUN 6 - 20 mg/dL  50  34   Creatinine 0.44 - 1.00 mg/dL  4.56  2.62   Sodium 135 - 145 mmol/L  139  135   Potassium 3.5 - 5.1 mmol/L  4.3  4.1   Chloride 98 - 111 mmol/L  108  100   CO2 22 - 32 mmol/L  22  27   Calcium 8.9 - 10.3 mg/dL  9.1  10.5   Total Protein 6.5 - 8.1 g/dL 7.3  7.8    Total Bilirubin 0.3 - 1.2 mg/dL 0.4  0.4    Alkaline Phos 38 - 126 U/L 85  94    AST 15 - 41 U/L 18  20    ALT 0 - 44 U/L 8  8       Radiology Studies: CT Soft Tissue Neck Wo Contrast  Result Date: 09/24/2021 CLINICAL DATA:  Left neck swelling.  History of lung carcinoma. EXAM: CT NECK WITHOUT CONTRAST TECHNIQUE: Multidetector CT imaging of the neck was performed following the standard protocol without intravenous contrast. RADIATION DOSE REDUCTION: This exam was performed according to the departmental dose-optimization program which includes automated exposure control, adjustment of the mA and/or kV according to patient size and/or use of iterative reconstruction technique. COMPARISON:  None Available. FINDINGS: Pharynx and larynx: Normal. No mass or swelling. Salivary glands: No inflammation, mass, or stone. Thyroid: Normal. Lymph nodes: There is a trans spatial left facial mass that abuts the inferior aspect of the left masseter muscle and measures 2.5 x 2.2 cm. There is a 2.2 cm left level 5A lymph node (image 55). Vascular: Unremarkable unenhanced appearance Limited intracranial: Negative. Visualized orbits: Negative. Mastoids and visualized paranasal sinuses: Bilateral maxillary sinus mucosal thickening. Small mastoid effusions. Skeleton: No acute or aggressive process. Upper chest: 17 mm nodule in the right upper lobe, more completely evaluated on concomitant chest CT. Other: None IMPRESSION: 1. Transspatial left facial mass that abuts the inferior aspect of the left  masseter muscle, most consistent with metastatic disease. 2. A 2.2 cm left level 5A lymph node. 3. A 17 mm nodule in the right upper lobe, more completely evaluated on concomitant chest CT. Electronically Signed   By: Ulyses Jarred M.D.   On: 09/24/2021 21:31   CT CHEST ABDOMEN PELVIS WO CONTRAST  Result Date: 09/24/2021 CLINICAL DATA:  Metastatic disease evaluation known lung cancer has renal failure. Noticed lump on abdomen and upper chest r/o mass EXAM: CT CHEST, ABDOMEN AND PELVIS WITHOUT CONTRAST TECHNIQUE: Multidetector CT imaging of the chest, abdomen and pelvis was performed following the standard protocol without IV contrast. RADIATION DOSE REDUCTION: This exam was performed according to the departmental dose-optimization program which includes automated exposure control, adjustment of the mA and/or kV according to patient size and/or use of iterative reconstruction technique. COMPARISON:  CT chest, abdomen, pelvis 03/11/2021 FINDINGS: CT  CHEST FINDINGS Right chest wall Port-A-Cath with tip terminating in the right atrium. Cardiovascular: Normal heart size. No significant pericardial effusion. The thoracic aorta is normal in caliber. No atherosclerotic plaque of the thoracic aorta. At least 2 vessel coronary artery calcifications. Mediastinum/Nodes: No gross hilar adenopathy, noting limited sensitivity for the detection of hilar adenopathy on this noncontrast study. No enlarged mediastinal or axillary lymph nodes. Thyroid gland, trachea, and esophagus demonstrate no significant findings. Lungs/Pleura: No focal consolidation. Interval increase in size of a 3.8 x 4.2 cm right upper lobe mass with interval increase in size of a satellite nodule adjacent to it measuring 1.7 cm. Slight interval increase in size of a subpleural spiculated right lower lobe nodule measuring 1.5 x 1.5 cm (2:15). No right pleural effusion. Likely loculated at least moderate volume left pleural effusion. Query underlying left lower  lobe mass lesion. No pneumothorax. Musculoskeletal: Multiple subcutaneus soft tissue nodules and masses with as an example a left breast 3.5 x 3.6 cm lesion (2:27). No suspicious lytic or blastic osseous lesions. No acute displaced fracture. Multilevel degenerative changes of the spine. CT ABDOMEN PELVIS FINDINGS Hepatobiliary: No focal liver abnormality. Calcified gallstone noted within the gallbladder lumen. No gallbladder wall thickening or pericholecystic fluid. No biliary dilatation. Pancreas: No focal lesion. Normal pancreatic contour. No surrounding inflammatory changes. No main pancreatic ductal dilatation. Spleen: Normal in size without focal abnormality. Adrenals/Urinary Tract: 3.3 x 2.2 cm right adrenal mass.  No left adrenal nodularity. No nephrolithiasis. Moderate right hydronephrosis with likely obstruction from mass lesion noted along the renal pelvis / hilum measuring 3.2 x 3.1 cm. No left hydronephrosis. Couple bilateral renal masses with as an example a 3.3 x 1.6 cm left lesion along the superior pole of the left kidney (2:58). Some of these may represent peritoneal implants adjacent to the kidneys with difficulty evaluating these on noncontrast study. No ureterolithiasis or hydroureter. The urinary bladder is unremarkable. Stomach/Bowel: Stomach is within normal limits. No evidence of bowel wall thickening or dilatation. Appendix appears normal. Vascular/Lymphatic: No abdominal aorta or iliac aneurysm. Moderate atherosclerotic plaque of the aorta and its branches. No abdominal, pelvic, or inguinal lymphadenopathy. Reproductive: Uterus and bilateral adnexa are unremarkable. Other: Right upper quadrant peritoneal implant measuring 2.3 x 2 cm (2:53). No intraperitoneal free fluid. No intraperitoneal free gas. No organized fluid collection. Musculoskeletal: Multiple subcutaneus soft tissue nodules and masses with as an example a anterior abdominal wall 4.3 x 3.1 cm (2:73). Ventral hernia containing  fat with an abdominal defect of the defect of 2.4 cm and a subjacent hernia with an abdominal defect of 3.2 cm. No suspicious lytic or blastic osseous lesions. No acute displaced fracture. Multilevel degenerative changes of the spine. IMPRESSION: 1. Findings consistent with worsening metastatic disease. Markedly limited evaluation on this noncontrast study. 2. Moderate right hydronephrosis with likely obstruction from mass lesion noted along the renal pelvis/hilum measuring 3.2 x 3.1 cm. 3. Interval increase in size of the right upper lobe dominant mass, adjacent right upper lobe satellite nodule, and known right lower lobe subpleural nodule. 4. Similar-appearing likely loculated at least moderate volume left pleural effusion. Query underlying left lower lobe mass lesion. 5. Interval development of bilateral renal masses/metastases. 6. Interval development of right adrenal metastasis. 7. Interval development of peritoneal implant/lesions. 8. Interval development of multiple subcutaneus soft tissue metastases. 9. Cholelithiasis with no CT evidence of acute cholecystitis. 10. Fat containing ventral wall hernias. 11.  Aortic Atherosclerosis (ICD10-I70.0). Electronically Signed   By: Clelia Croft.D.  On: 09/24/2021 21:23   CT HEAD WO CONTRAST (5MM)  Result Date: 09/24/2021 CLINICAL DATA:  Brain metastasis suspected, headache. History of lung cancer. EXAM: CT HEAD WITHOUT CONTRAST TECHNIQUE: Contiguous axial images were obtained from the base of the skull through the vertex without intravenous contrast. RADIATION DOSE REDUCTION: This exam was performed according to the departmental dose-optimization program which includes automated exposure control, adjustment of the mA and/or kV according to patient size and/or use of iterative reconstruction technique. COMPARISON:  04/17/2021. FINDINGS: Brain: Diffuse atrophy is noted. Periventricular white matter hypodensities are noted bilaterally. No hydrocephalus. There is  redemonstration of a cystic lesion in the parietal lobe on the right measuring 3.2 x 2.2 cm with associated mass effect, decreased in size from the prior exam. A 1.1 cm lesion is noted in the frontal lobe anteriorly with no significant mass effect and smaller as compared with the prior exam. Both lesions have a small thin hyperdense rim inferiorly in the possibility of blood products can not be excluded. No definite new lesions are seen. Evaluation for metastatic disease is limited due to lack of IV contrast. No midline shift. Vascular: Atherosclerotic calcification of the carotid siphons. No hyperdense vessel. Skull: Normal. Negative for fracture or focal lesion. Sinuses/Orbits: There is partial opacification of the right maxillary sinus and left sphenoid sinus. The orbits are within normal limits. Other: There is partial opacification of the mastoid air cells bilaterally. IMPRESSION: 1. Known cystic metastatic lesions in the parietal and frontal lobes on the right are decreased in size from the prior exam with reduced mass effect. A thin hyperdense rim is noted in both lesions inferiorly in the possibility of blood products can not be completely excluded. MRI is recommended for further evaluation. 2. Atrophy with chronic microvascular ischemic changes. Electronically Signed   By: Brett Fairy M.D.   On: 09/24/2021 21:20     Scheduled Meds:  carvedilol  3.125 mg Oral BID   docusate sodium  100 mg Oral BID   feeding supplement  237 mL Oral BID BM   ferrous sulfate  325 mg Oral Q breakfast   folic acid  1 mg Oral Daily   levETIRAcetam  500 mg Oral BID   pantoprazole  40 mg Oral Daily   senna  1 tablet Oral BID   thiamine  100 mg Oral Daily   Continuous Infusions:  sodium chloride 75 mL/hr at 09/25/21 0436     LOS: 0 days   Time spent: Harris, MD [facility] To contact the attending provider between 7A-7P or the covering provider during after hours 7P-7A, please log  into the web site [email or web address] and access using universal [place] password for that web site. If you do not have the password, please call the hospital operator.  09/25/2021, 6:50 AM

## 2021-09-25 NOTE — Progress Notes (Addendum)
HEMATOLOGY-ONCOLOGY PROGRESS NOTE  ASSESSMENT AND PLAN: This is a 61 year old African-American female with stage IV (T2a, N2, M1b) non-small cell lung cancer, adenocarcinoma presented with a large right upper lobe lung mass in addition to suspicious right paratracheal lymphadenopathy and bilateral pulmonary nodules as well as retroperitoneal lymphadenopathy diagnosed in May 2021.  She has no actionable mutations and is negative for PD-L1.   The patient started palliative systemic chemotherapy with carboplatin for an AUC of 5, Alimta 500 mg/m, and Keytruda 200 mg IV every 3 weeks.  Starting from cycle #5, she has been on maintenance Alimta and Keytruda.  Her Alimta dose has been dose reduced secondary to neutropenia.  She has also required intermittent holding of Alimta due to renal insufficiency.  She last received her chemotherapy on 06/18/2020.  She was last seen by [place] during her hospitalization in May 2022.  She did not follow-up with [place] as directed.  Imaging findings now show widespread progression of her lung cancer in multiple areas.  I discussed the CT scan findings with the patient today.  Given her poor performance status, AKI, and other comorbid conditions, she is not a candidate for additional systemic treatment.  Recommend palliative care consultation to discuss goals of care.  Would recommend hospice.   Mikey Bussing  SUBJECTIVE: Brenda [name] has been followed by our office for stage IV (T2 a, N2, M1 B) non-small cell lung cancer, adenocarcinoma.  She was previously receiving treatment with maintenance Alimta and Keytruda.  Alimta was dose reduced secondary to neutropenia and occasionally held due to renal insufficiency.  Has not followed up in our office since 06/18/2020.  Has no showed for multiple appointments.  Was last seen by oncology during her hospitalization in May 2022.  At that time, goals of care were discussed with her because she was thought not to be a candidate for additional  systemic treatment.  Outpatient follow-up was scheduled but she did not show up.  Admitted due to facial swelling.  Has been residing at a long-term care facility.  Imaging findings showed worsening metastatic disease as well as moderate right hydronephrosis with likely obstruction from mass lesion along the renal pelvis/hilum measuring 3.2 x 3.1 cm.  Creatinine on admission was 4.5.  The patient has indicated that she wants aggressive treatment for her cancer.  I saw the patient this morning in the emergency department.  No family at the bedside.  She was eating breakfast.  She has no specific complaints this morning except for some left shoulder discomfort.  She is not having any significant shortness of breath, chest pain, abdominal pain, nausea, vomiting.  Oncology History  Malignant neoplasm of right lung ([place])  09/14/2019 Initial Diagnosis   Adenocarcinoma of right lung, stage 4 ([place])   09/28/2019 - 06/18/2020 Chemotherapy   Patient is on Treatment Plan : LUNG CARBOplatin / Pemetrexed / Pembrolizumab q21d Induction x 4 cycles / Maintenance Pemetrexed + Pembrolizumab     04/17/2020 Cancer Staging   Staging form: Lung, AJCC 8th Edition - Clinical: Stage IVA Brenda [name], cN2, cM1b) - Signed by Curt Bears, MD on 04/17/2020      REVIEW OF SYSTEMS:   Review of Systems  Constitutional:  Positive for malaise/fatigue. Negative for chills and fever.  HENT: Negative.    Respiratory: Negative.    Cardiovascular: Negative.   Gastrointestinal: Negative.   Musculoskeletal:        Left shoulder pain  Skin: Negative.   Neurological: Negative.     I have reviewed the  past medical history, past surgical history, social history and family history with the patient and they are unchanged from previous note.   PHYSICAL EXAMINATION: ECOG PERFORMANCE STATUS: 3 - Symptomatic, >50% confined to bed  Vitals:   09/25/21 0600 09/25/21 0900  BP: 124/87 124/66  Pulse: 87 86  Resp: 17 20  Temp:    SpO2:  100% 100%   Filed Weights   09/24/21 2030  Weight: 46 kg    Intake/Output from previous day: No intake/output data recorded.  Physical Exam Vitals reviewed.  Constitutional:      Appearance: She is ill-appearing.  HENT:     Head: Normocephalic.     Mouth/Throat:     Pharynx: No oropharyngeal exudate or posterior oropharyngeal erythema.  Eyes:     General: No scleral icterus.    Conjunctiva/sclera: Conjunctivae normal.  Cardiovascular:     Rate and Rhythm: Normal rate.  Pulmonary:     Breath sounds: Rhonchi present.  Abdominal:     Palpations: Abdomen is soft.     Tenderness: There is no abdominal tenderness.  Skin:    General: Skin is warm and dry.  Neurological:     Mental Status: She is alert and oriented to person, place, and time.     LABORATORY DATA:  I have reviewed the data as listed    Latest Ref Rng & Units 09/24/2021   11:30 PM 09/24/2021    8:37 PM 05/07/2021    1:17 PM  CMP  Glucose 70 - 99 mg/dL  84  103   BUN 6 - 20 mg/dL  50  34   Creatinine 0.44 - 1.00 mg/dL  4.56  2.62   Sodium 135 - 145 mmol/L  139  135   Potassium 3.5 - 5.1 mmol/L  4.3  4.1   Chloride 98 - 111 mmol/L  108  100   CO2 22 - 32 mmol/L  22  27   Calcium 8.9 - 10.3 mg/dL  9.1  10.5   Total Protein 6.5 - 8.1 g/dL 7.3  7.8    Total Bilirubin 0.3 - 1.2 mg/dL 0.4  0.4    Alkaline Phos 38 - 126 U/L 85  94    AST 15 - 41 U/L 18  20    ALT 0 - 44 U/L 8  8      Lab Results  Component Value Date   WBC 5.2 09/24/2021   HGB 8.4 (L) 09/24/2021   HCT 27.4 (L) 09/24/2021   MCV 102.6 (H) 09/24/2021   PLT 264 09/24/2021   NEUTROABS 2.7 09/24/2021    No results found for: "CEA1", "CEA", "CAN199", "CA125", "PSA1"  CT Soft Tissue Neck Wo Contrast  Result Date: 09/24/2021 CLINICAL DATA:  Left neck swelling.  History of lung carcinoma. EXAM: CT NECK WITHOUT CONTRAST TECHNIQUE: Multidetector CT imaging of the neck was performed following the standard protocol without intravenous contrast.  RADIATION DOSE REDUCTION: This exam was performed according to the departmental dose-optimization program which includes automated exposure control, adjustment of the mA and/or kV according to patient size and/or use of iterative reconstruction technique. COMPARISON:  None Available. FINDINGS: Pharynx and larynx: Normal. No mass or swelling. Salivary glands: No inflammation, mass, or stone. Thyroid: Normal. Lymph nodes: There is a trans spatial left facial mass that abuts the inferior aspect of the left masseter muscle and measures 2.5 x 2.2 cm. There is a 2.2 cm left level 5A lymph node (image 55). Vascular: Unremarkable unenhanced appearance Limited intracranial: Negative.  Visualized orbits: Negative. Mastoids and visualized paranasal sinuses: Bilateral maxillary sinus mucosal thickening. Small mastoid effusions. Skeleton: No acute or aggressive process. Upper chest: 17 mm nodule in the right upper lobe, more completely evaluated on concomitant chest CT. Other: None IMPRESSION: 1. Transspatial left facial mass that abuts the inferior aspect of the left masseter muscle, most consistent with metastatic disease. 2. A 2.2 cm left level 5A lymph node. 3. A 17 mm nodule in the right upper lobe, more completely evaluated on concomitant chest CT. Electronically Signed   By: Ulyses Jarred M.D.   On: 09/24/2021 21:31   CT CHEST ABDOMEN PELVIS WO CONTRAST  Result Date: 09/24/2021 CLINICAL DATA:  Metastatic disease evaluation known lung cancer has renal failure. Noticed lump on abdomen and upper chest r/o mass EXAM: CT CHEST, ABDOMEN AND PELVIS WITHOUT CONTRAST TECHNIQUE: Multidetector CT imaging of the chest, abdomen and pelvis was performed following the standard protocol without IV contrast. RADIATION DOSE REDUCTION: This exam was performed according to the departmental dose-optimization program which includes automated exposure control, adjustment of the mA and/or kV according to patient size and/or use of iterative  reconstruction technique. COMPARISON:  CT chest, abdomen, pelvis 03/11/2021 FINDINGS: CT CHEST FINDINGS Right chest wall Port-A-Cath with tip terminating in the right atrium. Cardiovascular: Normal heart size. No significant pericardial effusion. The thoracic aorta is normal in caliber. No atherosclerotic plaque of the thoracic aorta. At least 2 vessel coronary artery calcifications. Mediastinum/Nodes: No gross hilar adenopathy, noting limited sensitivity for the detection of hilar adenopathy on this noncontrast study. No enlarged mediastinal or axillary lymph nodes. Thyroid gland, trachea, and esophagus demonstrate no significant findings. Lungs/Pleura: No focal consolidation. Interval increase in size of a 3.8 x 4.2 cm right upper lobe mass with interval increase in size of a satellite nodule adjacent to it measuring 1.7 cm. Slight interval increase in size of a subpleural spiculated right lower lobe nodule measuring 1.5 x 1.5 cm (2:15). No right pleural effusion. Likely loculated at least moderate volume left pleural effusion. Query underlying left lower lobe mass lesion. No pneumothorax. Musculoskeletal: Multiple subcutaneus soft tissue nodules and masses with as an example a left breast 3.5 x 3.6 cm lesion (2:27). No suspicious lytic or blastic osseous lesions. No acute displaced fracture. Multilevel degenerative changes of the spine. CT ABDOMEN PELVIS FINDINGS Hepatobiliary: No focal liver abnormality. Calcified gallstone noted within the gallbladder lumen. No gallbladder wall thickening or pericholecystic fluid. No biliary dilatation. Pancreas: No focal lesion. Normal pancreatic contour. No surrounding inflammatory changes. No main pancreatic ductal dilatation. Spleen: Normal in size without focal abnormality. Adrenals/Urinary Tract: 3.3 x 2.2 cm right adrenal mass.  No left adrenal nodularity. No nephrolithiasis. Moderate right hydronephrosis with likely obstruction from mass lesion noted along the renal  pelvis / hilum measuring 3.2 x 3.1 cm. No left hydronephrosis. Couple bilateral renal masses with as an example a 3.3 x 1.6 cm left lesion along the superior pole of the left kidney (2:58). Some of these may represent peritoneal implants adjacent to the kidneys with difficulty evaluating these on noncontrast study. No ureterolithiasis or hydroureter. The urinary bladder is unremarkable. Stomach/Bowel: Stomach is within normal limits. No evidence of bowel wall thickening or dilatation. Appendix appears normal. Vascular/Lymphatic: No abdominal aorta or iliac aneurysm. Moderate atherosclerotic plaque of the aorta and its branches. No abdominal, pelvic, or inguinal lymphadenopathy. Reproductive: Uterus and bilateral adnexa are unremarkable. Other: Right upper quadrant peritoneal implant measuring 2.3 x 2 cm (2:53). No intraperitoneal free fluid. No intraperitoneal free  gas. No organized fluid collection. Musculoskeletal: Multiple subcutaneus soft tissue nodules and masses with as an example a anterior abdominal wall 4.3 x 3.1 cm (2:73). Ventral hernia containing fat with an abdominal defect of the defect of 2.4 cm and a subjacent hernia with an abdominal defect of 3.2 cm. No suspicious lytic or blastic osseous lesions. No acute displaced fracture. Multilevel degenerative changes of the spine. IMPRESSION: 1. Findings consistent with worsening metastatic disease. Markedly limited evaluation on this noncontrast study. 2. Moderate right hydronephrosis with likely obstruction from mass lesion noted along the renal pelvis/hilum measuring 3.2 x 3.1 cm. 3. Interval increase in size of the right upper lobe dominant mass, adjacent right upper lobe satellite nodule, and known right lower lobe subpleural nodule. 4. Similar-appearing likely loculated at least moderate volume left pleural effusion. Query underlying left lower lobe mass lesion. 5. Interval development of bilateral renal masses/metastases. 6. Interval development of  right adrenal metastasis. 7. Interval development of peritoneal implant/lesions. 8. Interval development of multiple subcutaneus soft tissue metastases. 9. Cholelithiasis with no CT evidence of acute cholecystitis. 10. Fat containing ventral wall hernias. 11.  Aortic Atherosclerosis (ICD10-I70.0). Electronically Signed   By: Iven Finn M.D.   On: 09/24/2021 21:23   CT HEAD WO CONTRAST (5MM)  Result Date: 09/24/2021 CLINICAL DATA:  Brain metastasis suspected, headache. History of lung cancer. EXAM: CT HEAD WITHOUT CONTRAST TECHNIQUE: Contiguous axial images were obtained from the base of the skull through the vertex without intravenous contrast. RADIATION DOSE REDUCTION: This exam was performed according to the departmental dose-optimization program which includes automated exposure control, adjustment of the mA and/or kV according to patient size and/or use of iterative reconstruction technique. COMPARISON:  04/17/2021. FINDINGS: Brain: Diffuse atrophy is noted. Periventricular white matter hypodensities are noted bilaterally. No hydrocephalus. There is redemonstration of a cystic lesion in the parietal lobe on the right measuring 3.2 x 2.2 cm with associated mass effect, decreased in size from the prior exam. A 1.1 cm lesion is noted in the frontal lobe anteriorly with no significant mass effect and smaller as compared with the prior exam. Both lesions have a small thin hyperdense rim inferiorly in the possibility of blood products can not be excluded. No definite new lesions are seen. Evaluation for metastatic disease is limited due to lack of IV contrast. No midline shift. Vascular: Atherosclerotic calcification of the carotid siphons. No hyperdense vessel. Skull: Normal. Negative for fracture or focal lesion. Sinuses/Orbits: There is partial opacification of the right maxillary sinus and left sphenoid sinus. The orbits are within normal limits. Other: There is partial opacification of the mastoid air  cells bilaterally. IMPRESSION: 1. Known cystic metastatic lesions in the parietal and frontal lobes on the right are decreased in size from the prior exam with reduced mass effect. A thin hyperdense rim is noted in both lesions inferiorly in the possibility of blood products can not be completely excluded. MRI is recommended for further evaluation. 2. Atrophy with chronic microvascular ischemic changes. Electronically Signed   By: Brett Fairy M.D.   On: 09/24/2021 21:20     No future appointments.    LOS: 0 days   ADDENDUM: Hematology/Oncology Attending: The patient is seen and examined today.  I reviewed her record and I agree with the above note.  This is a 61 years old African-American female with stage IV non-small cell lung cancer, adenocarcinoma diagnosed in May 2021.  She has no actionable mutation and negative PD-L1 expression.  The patient was treated initially  with systemic chemotherapy with carboplatin, Alimta and Keytruda for 4 cycles followed by maintenance treatment with Alimta and Keytruda for 6 more cycles.  The patient was not compliant with her additional treatment and she was lost to follow-up for more than 2 years.  She had evidence for disease progression and she was treated with palliative radiotherapy.  She has a lot of worsening of her disease over the last 2 years including brain metastasis as well as progressive disease in the chest abdomen and pelvis. She had significant chronic insufficiency at this point.  The patient is not a good candidate for any additional systemic treatment because of the poor performance status and other comorbidities. I strongly recommend for the patient to consider palliative care and hospice at this point.  I discussed my recommendation in details with the patient today. Thank you so much for taking good care of [name].  Please call if we can help in any other way. Disclaimer: This note was dictated with voice recognition software. Similar  sounding words can inadvertently be transcribed and may be missed upon review. Eilleen Kempf, MD

## 2021-09-25 NOTE — Progress Notes (Signed)
PT Cancellation Note  Patient Details Name: Brenda [name] MRN: [id number] DOB: Nov 15, 1960   Cancelled Treatment:    Reason Eval/Treat Not Completed: Medical issues which prohibited therapy, patient to be seen by oncology and Palliative medicine. Patient comes from [place], last admission was max assist. Will  follow up after medical consults complete.   Pt admitted with above diagnosis.  [place] Office [phone] Weekend pager-[phone]    Claretha Cooper 09/25/2021, 7:56 AM

## 2021-09-26 ENCOUNTER — Encounter (HOSPITAL_COMMUNITY): Payer: Self-pay | Admitting: Internal Medicine

## 2021-09-26 DIAGNOSIS — N179 Acute kidney failure, unspecified: Secondary | ICD-10-CM | POA: Diagnosis not present

## 2021-09-26 LAB — CBC WITH DIFFERENTIAL/PLATELET
Abs Immature Granulocytes: 0.01 10*3/uL (ref 0.00–0.07)
Basophils Absolute: 0 10*3/uL (ref 0.0–0.1)
Basophils Relative: 1 %
Eosinophils Absolute: 0.1 10*3/uL (ref 0.0–0.5)
Eosinophils Relative: 3 %
HCT: 25.2 % — ABNORMAL LOW (ref 36.0–46.0)
Hemoglobin: 7.8 g/dL — ABNORMAL LOW (ref 12.0–15.0)
Immature Granulocytes: 0 %
Lymphocytes Relative: 36 %
Lymphs Abs: 1.6 10*3/uL (ref 0.7–4.0)
MCH: 31.6 pg (ref 26.0–34.0)
MCHC: 31 g/dL (ref 30.0–36.0)
MCV: 102 fL — ABNORMAL HIGH (ref 80.0–100.0)
Monocytes Absolute: 0.6 10*3/uL (ref 0.1–1.0)
Monocytes Relative: 13 %
Neutro Abs: 2.1 10*3/uL (ref 1.7–7.7)
Neutrophils Relative %: 47 %
Platelets: 235 10*3/uL (ref 150–400)
RBC: 2.47 MIL/uL — ABNORMAL LOW (ref 3.87–5.11)
RDW: 14 % (ref 11.5–15.5)
WBC: 4.4 10*3/uL (ref 4.0–10.5)
nRBC: 0 % (ref 0.0–0.2)

## 2021-09-26 LAB — COMPREHENSIVE METABOLIC PANEL
ALT: 8 U/L (ref 0–44)
AST: 18 U/L (ref 15–41)
Albumin: 2.9 g/dL — ABNORMAL LOW (ref 3.5–5.0)
Alkaline Phosphatase: 74 U/L (ref 38–126)
Anion gap: 7 (ref 5–15)
BUN: 52 mg/dL — ABNORMAL HIGH (ref 6–20)
CO2: 19 mmol/L — ABNORMAL LOW (ref 22–32)
Calcium: 8.8 mg/dL — ABNORMAL LOW (ref 8.9–10.3)
Chloride: 112 mmol/L — ABNORMAL HIGH (ref 98–111)
Creatinine, Ser: 4.24 mg/dL — ABNORMAL HIGH (ref 0.44–1.00)
GFR, Estimated: 11 mL/min — ABNORMAL LOW (ref >60)
Glucose, Bld: 88 mg/dL (ref 70–99)
Potassium: 5.1 mmol/L (ref 3.5–5.1)
Sodium: 138 mmol/L (ref 135–145)
Total Bilirubin: 0.5 mg/dL (ref 0.3–1.2)
Total Protein: 6.4 g/dL — ABNORMAL LOW (ref 6.5–8.1)

## 2021-09-26 LAB — HIV-1 RNA QUANT-NO REFLEX-BLD
HIV 1 RNA Quant: <20 copies/mL
LOG10 HIV-1 RNA: UNDETERMINED log10copy/mL

## 2021-09-26 MED ORDER — MUPIROCIN 2 % EX OINT
1.0000 | TOPICAL_OINTMENT | Freq: Two times a day (BID) | CUTANEOUS | Status: DC
  Administered 2021-09-26 – 2021-09-29 (×8): 1 via NASAL
  Filled 2021-09-26 (×2): qty 22

## 2021-09-26 MED ORDER — CEFAZOLIN SODIUM-DEXTROSE 2-4 GM/100ML-% IV SOLN: 2.0000 g | INTRAVENOUS | Status: AC

## 2021-09-26 MED ORDER — ENSURE ENLIVE PO LIQD
237.0000 mL | Freq: Three times a day (TID) | ORAL | Status: DC
  Administered 2021-09-26 – 2021-09-29 (×4): 237 mL via ORAL

## 2021-09-26 MED ORDER — CHLORHEXIDINE GLUCONATE CLOTH 2 % EX PADS
6.0000 | MEDICATED_PAD | Freq: Every day | CUTANEOUS | Status: DC
  Administered 2021-09-26 – 2021-09-29 (×4): 6 via TOPICAL

## 2021-09-26 NOTE — Progress Notes (Signed)
Initial Nutrition Assessment  DOCUMENTATION CODES:   Non-severe (moderate) malnutrition in context of chronic illness  INTERVENTION:  - continue Ensure Plus High Protein and will increase from BID to TID, each supplement provides 350 kcal and 20 grams of protein.   NUTRITION DIAGNOSIS:   Moderate Malnutrition related to chronic illness, cancer and cancer related treatments as evidenced by mild fat depletion, mild muscle depletion, moderate muscle depletion.  GOAL:   Patient will meet greater than or equal to 90% of their needs  MONITOR:   PO intake, Supplement acceptance, Labs, Weight trends  REASON FOR ASSESSMENT:   Consult Assessment of nutrition requirement/status  ASSESSMENT:   61 y.o. 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 She presented to the ED from [place] due to facial and neck swelling that began the day prior. She was admitted with dx of AKI.  Patient sitting up in bed with no visitors present at the time of RD visit. Flow sheet documentation indicates patient ate 20% of breakfast this AM. Patient recalls having a piece of bologna and 2 cups of Coke for lunch today.   She reports that at baseline she is able to ambulate unassisted, able to feed herself, able to dress herself.   She shares that she was drinking Ensure PTA and that she drank a full bottle this AM.   She denies any chewing or swallowing pain or difficulties. No abdominal pain, pressure, cramping, or nausea with intakes. She does report that abdomen feels hard.   Noted swelling to L cheek and L side of neck. Patient reports this began 1-2 days ago and does not cause any discomfort.   Weight on 7/5 was 101 lb and weight has been stable and slightly up since 03/12/21.   Labs reviewed; Cl: 112 mmol/l, BUN: 52 mg/dl, creatinine: 4.24 mg/dl,  GFR: 11 ml/min. K, Mg, and Phos all high end of normal.   Medications reviewed; 100 mg colace BID, 325 mg ferrous sulfate/day, 1 mg folvite/day, 40 mg oral protonix/day, 1 tablet senokot BID, 100 mg oral thiamine/day.   IVF; NS @ 75 ml/hr.     NUTRITION - FOCUSED PHYSICAL EXAM:  Flowsheet Row Most Recent Value  Orbital Region No depletion  Upper Arm Region Mild depletion  Thoracic and Lumbar Region Unable to assess  Buccal Region Mild depletion  Temple Region No depletion  Clavicle Bone Region Mild depletion  Clavicle and Acromion Bone Region Moderate depletion  Scapular Bone Region Mild depletion  Dorsal Hand Mild depletion  Patellar Region Moderate depletion  Anterior Thigh Region Moderate depletion  Posterior Calf Region Moderate depletion  Edema (RD Assessment) None  Hair Reviewed  Eyes Reviewed  Mouth Reviewed  Skin Reviewed  Nails Unable to assess  [nail polish]       Diet Order:   Diet Order             Diet regular Room service appropriate? Yes; Fluid consistency: Thin  Diet effective now                   EDUCATION NEEDS:   Education needs have been addressed  Skin:  Skin Assessment: Reviewed RN Assessment  Last BM:  PTA/unknown  Height:   Ht Readings from Last 1 Encounters:  09/24/21 5\' 6"  (1.676 m)    Weight:   Wt Readings from Last 1 Encounters:  09/24/21  46 kg     BMI:  Body mass index is 16.37 kg/m.  Estimated Nutritional Needs:  Kcal:  1600-1800 kcal Protein:  85-100 grams Fluid:  >/= 2 L/day     Jarome Matin, MS, RD, LDN, CNSC Registered Dietitian II Inpatient Clinical Nutrition RD pager # and on-call/weekend pager # available in [facility]

## 2021-09-26 NOTE — Progress Notes (Signed)
PT Cancellation Note / Screen  Patient Details Name: Brenda [name] MRN: [id number] DOB: 20-Aug-1960   Cancelled Treatment:    Reason Eval/Treat Not Completed: PT screened, no needs identified, will sign off Pt seen by OT yesterday and able to ambulate.  Pt reported being at mobility baseline.  Pt from LTC with plans to return and also palliative consult pending.  Please refer to OT evaluation 09/25/21 for mobility information and recommendations.  Pt does not appear to need acute PT at this time.  Please reorder if new needs arise.   Myrtis Hopping [name] 09/26/2021, 11:39 AM Arlyce Dice, DPT Physical Therapist Acute Rehabilitation Services Preferred contact method: Secure Chat Weekend Pager Only: [phone] Office: [phone]

## 2021-09-26 NOTE — TOC Initial Note (Signed)
Transition of Care [facility]) - Initial/Assessment Note    Patient Details  Name: Brenda [name] MRN: [id number] Date of Birth: 1960-08-07  Transition of Care [facility] CM/SW Contact:    Servando Snare, LCSW Phone Number: 09/26/2021, 10:51 AM  Clinical Narrative:       Patient is a resident at [facility] formally [place]. TOC confirmed with facility that plan is for patient to return at dc. Palliative consult pending for patient.             Expected Discharge Plan: Skilled Nursing Facility Barriers to Discharge: Continued Medical Work up   Patient Goals and CMS Choice     Choice offered to / list presented to : NA  Expected Discharge Plan and Services Expected Discharge Plan: [place] In-house Referral: Hospice / Palliative Care Discharge Planning Services: NA Post Acute Care Choice: [place] Living arrangements for the past 2 months: [place]                 DME Arranged: N/A DME Agency: NA       HH Arranged: NA [place] Agency: NA        Prior Living Arrangements/Services Living arrangements for the past 2 months: [place] Lives with:: Facility Resident Patient language and need for interpreter reviewed:: Yes Do you feel safe going back to the place where you live?: Yes      Need for Family Participation in Patient Care: Yes (Comment) Care giver support system in place?: Yes (comment)   Criminal Activity/Legal Involvement Pertinent to Current Situation/Hospitalization: No - Comment as needed  Activities of Daily Living Home Assistive Devices/Equipment: Wheelchair ADL Screening (condition at time of admission) Patient's cognitive ability adequate to safely complete daily activities?: Yes Is the patient deaf or have difficulty hearing?: No Does the patient have difficulty seeing, even when wearing glasses/contacts?: No Does the patient have difficulty concentrating, remembering, or making decisions?:  Yes Patient able to express need for assistance with ADLs?: Yes Does the patient have difficulty dressing or bathing?: Yes Independently performs ADLs?: No Communication: Independent Dressing (OT): Needs assistance Is this a change from baseline?: Pre-admission baseline Grooming: Needs assistance Is this a change from baseline?: Pre-admission baseline Feeding: Needs assistance Is this a change from baseline?: Pre-admission baseline Bathing: Needs assistance Is this a change from baseline?: Pre-admission baseline Toileting: Needs assistance Is this a change from baseline?: Pre-admission baseline In/Out Bed: Needs assistance Is this a change from baseline?: Pre-admission baseline Walks in Home: Needs assistance Is this a change from baseline?: Pre-admission baseline Does the patient have difficulty walking or climbing stairs?: Yes Weakness of Legs: Both Weakness of Arms/Hands: Both  Permission Sought/Granted Permission sought to share information with : Facility Sport and exercise psychologist, Family Supports    Share Information with NAME: [name] [phone]  Permission granted to share info w AGENCY: [facility]  Permission granted to share info w Relationship: Niece/HCPOA     Emotional Assessment Appearance:: Appears stated age Attitude/Demeanor/Rapport: Unable to Assess Affect (typically observed): Unable to Assess Orientation: : Oriented to Self Alcohol / Substance Use: Not Applicable Psych Involvement: No (comment)  Admission diagnosis:  AKI (acute kidney injury) ([place]) [N17.9] Primary malignant neoplasm of lung metastatic to other site, unspecified laterality ([place]) [C34.90] Hydronephrosis, unspecified hydronephrosis type [N13.30] Patient Active Problem List   Diagnosis Date Noted   Breast mass 09/24/2021   Essential hypertension 09/24/2021   Palliative care patient 06/25/2021   Chronic combined systolic and diastolic congestive heart failure (  [place]) 04/30/2021    Stage 3b chronic kidney disease (CKD) ([place]) 04/30/2021   Anemia of chronic disease 04/30/2021   Brain mass 04/08/2021   Protein-calorie malnutrition, severe 03/12/2021   Primary malignant neoplasm of lung with metastasis to brain ([place]) 03/11/2021   Exudative pleural effusion    Sinus tachycardia    Pleural effusion    S/P thoracentesis    Hypomagnesemia    Acute on chronic combined systolic and diastolic congestive heart failure (HCC)    Bacteremia associated with intravascular line ([place]) 07/03/2020   Acute blood loss anemia    Renal failure 07/01/2020   AKI (acute kidney injury) ([place])    Metabolic acidosis    Epistaxis    Seizure-like activity (HCC)    Swelling of lower extremity 05/29/2020   Hypoalbuminemia 05/29/2020   Acute on chronic anemia 04/07/2020   Gastrointestinal hemorrhage 04/07/2020   Acute encephalopathy 04/07/2020   Pancytopenia ([place]) 04/07/2020   Polysubstance abuse ([place]) 04/07/2020   Hypokalemia 10/19/2019   Neutropenia ([place]) 10/05/2019   Malignant neoplasm of right lung ([place]) 09/14/2019   Encounter for antineoplastic chemotherapy 09/14/2019   Encounter for antineoplastic immunotherapy 09/14/2019   Goals of care, counseling/discussion 09/14/2019   Tobacco abuse counseling 09/14/2019   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 ([place])    Alcohol abuse    Acute respiratory failure ([place]) 04/03/2019   Lung mass    Multifocal pneumonia    Major depressive disorder    Major depressive disorder, recurrent episode with mood-congruent psychotic features ([place]) 05/30/2017   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[place]) 12/26/2011   PCP:  Pcp, No Pharmacy:  No Pharmacies Listed    Social Determinants of Health (SDOH) Interventions    Readmission Risk Interventions    03/14/2021   11:37 AM 07/17/2020    2:12 PM 04/11/2020   12:16 PM  Readmission Risk Prevention Plan  Transportation Screening Complete Complete Complete  PCP or Specialist Appt within 3-5  Days   Complete  HRI or [place]  Complete Complete  Social Work Consult for [place] Planning/Counseling  Complete Complete  Palliative Care Screening  Not Applicable Not Applicable  Medication Review Press photographer) Complete Complete Complete  PCP or Specialist appointment within 3-5 days of discharge Complete    HRI or [place] Complete    SW Recovery Care/Counseling Consult Complete    Palliative Care Screening Complete    Cash Patient Refused

## 2021-09-26 NOTE — Consult Note (Signed)
Urology Consult   Physician requesting consult: Dr. Verneita Griffes  Reason for consult: Right ureteral obstruction, acute kidney injury  History of Present Illness: Brenda [name] is a 61 y.o. with metastatic lung cancer.  This has significantly progressed and she does not have other options for systemic therapy.  All treatment has been palliative.  Dr. Earlie Server did see her yesterday and recommended consideration of palliative care and hospice care.  She was noted on CT imaging to have right-sided hydronephrosis.  Her baseline serum creatinine is 2.6 likely related to her chronic medical condition.  However, her creatinine was over 4.5 on admission.  With fluid hydration, this is gradually improved to 4.2 currently.  She is completely asymptomatic from her right hydronephrosis and denies any flank or abdominal pain.   Past Medical History:  Diagnosis Date   Breast cancer ([facility])    Depression    Mental disorder     Past Surgical History:  Procedure Laterality Date   ESOPHAGOGASTRODUODENOSCOPY (EGD) WITH PROPOFOL N/A 04/09/2020   Procedure: ESOPHAGOGASTRODUODENOSCOPY (EGD) WITH PROPOFOL;  Surgeon: Doran Stabler, MD;  Location: [facility];  Service: Gastroenterology;  Laterality: N/A;   IR IMAGING GUIDED PORT INSERTION  11/03/2019   TUBAL LIGATION     VIDEO BRONCHOSCOPY WITH ENDOBRONCHIAL NAVIGATION N/A 04/04/2019   Procedure: VIDEO BRONCHOSCOPY WITH ENDOBRONCHIAL NAVIGATION;  Surgeon: Garner Nash, DO;  Location: [facility];  Service: Thoracic;  Laterality: N/A;   VIDEO BRONCHOSCOPY WITH ENDOBRONCHIAL ULTRASOUND N/A 04/04/2019   Procedure: VIDEO BRONCHOSCOPY WITH ENDOBRONCHIAL ULTRASOUND;  Surgeon: Garner Nash, DO;  Location: [facility] [facility];  Service: Thoracic;  Laterality: N/A;     Current Hospital Medications:  Home meds:  No current facility-administered medications on file prior to encounter.   Current Outpatient Medications on File Prior to Encounter  Medication Sig Dispense Refill    acetaminophen (TYLENOL) 325 MG tablet Take 2 tablets (650 mg total) by mouth every 6 (six) hours as needed for mild pain, moderate pain or headache.     bisacodyl (DULCOLAX) 10 MG suppository Place 1 suppository (10 mg total) rectally daily as needed for moderate constipation. (Patient taking differently: Place 10 mg rectally daily as needed for moderate constipation (if not relieved by MOM).) 12 suppository 0   carvedilol (COREG) 3.125 MG tablet Take 1 tablet (3.125 mg total) by mouth 2 (two) times daily.     feeding supplement (ENSURE ENLIVE / ENSURE PLUS) LIQD Take 237 mLs by mouth 2 (two) times daily between meals. 237 mL 12   ferrous sulfate 324 MG TBEC Take 324 mg by mouth daily with breakfast.     folic acid (FOLVITE) 1 MG tablet TAKE 1 TABLET (1 MG TOTAL) BY MOUTH DAILY. (Patient taking differently: Take 1 mg by mouth in the morning.) 30 tablet 2   gabapentin (NEURONTIN) 300 MG capsule Take 1 capsule (300 mg total) by mouth at bedtime. 30 capsule 2   levETIRAcetam (KEPPRA) 100 MG/ML solution Take 5 mLs (500 mg total) by mouth 2 (two) times daily. 473 mL 12   Multiple Vitamin (MULTIVITAMIN WITH MINERALS) TABS tablet Take 1 tablet by mouth daily.     oxycodone (OXY-IR) 5 MG capsule Take 1 capsule (5 mg total) by mouth every 6 (six) hours. (Patient taking differently: Take 5 mg by mouth every 6 (six) hours as needed for pain.) 30 capsule 0   pantoprazole (PROTONIX) 40 MG tablet Take 1 tablet (40 mg total) by mouth daily.     polyethylene glycol (MIRALAX /  GLYCOLAX) 17 g packet Dissolve 1 packet (17 g) in liquid and drink 2 (two) times daily. 14 each 0   thiamine 100 MG tablet Take 1 tablet (100 mg total) by mouth daily.     vitamin B-12 (CYANOCOBALAMIN) 500 MCG tablet Take 1 tablet (500 mcg total) by mouth daily.     ferrous sulfate 325 (65 FE) MG tablet Take 1 tablet (325 mg total) by mouth daily with breakfast. (Patient not taking: Reported on 09/25/2021)  3   LORazepam (ATIVAN) 0.5 MG tablet  Take 1 tablet (0.5 mg total) by mouth every 6 (six) hours as needed for anxiety. (Patient not taking: Reported on 09/25/2021) 10 tablet 0   oxyCODONE (ROXICODONE) 5 MG/5ML solution Take 5 mLs (5 mg total) by mouth every 4 (four) hours as needed for severe pain. (Patient not taking: Reported on 09/25/2021) 15 mL 0   sennosides-docusate sodium (SENOKOT-S) 8.6-50 MG tablet Take 2 tablets by mouth daily. (Patient not taking: Reported on 09/25/2021) 60 tablet 5   [DISCONTINUED] prochlorperazine (COMPAZINE) 10 MG tablet Take 1 tablet (10 mg total) by mouth every 6 (six) hours as needed for nausea or vomiting. (Patient not taking: Reported on 04/08/2020) 30 tablet 3     Scheduled Meds:  carvedilol  3.125 mg Oral BID   Chlorhexidine Gluconate Cloth  6 each Topical Q0600   docusate sodium  100 mg Oral BID   feeding supplement  237 mL Oral TID BM   ferrous sulfate  325 mg Oral Q breakfast   folic acid  1 mg Oral Daily   levETIRAcetam  500 mg Oral BID   mupirocin ointment  1 Application Nasal BID   pantoprazole  40 mg Oral Daily   senna  1 tablet Oral BID   thiamine  100 mg Oral Daily   Continuous Infusions:  sodium chloride 75 mL/hr at 09/26/21 1603   PRN Meds:.acetaminophen **OR** acetaminophen, bisacodyl, HYDROcodone-acetaminophen, LORazepam, oxyCODONE, polyethylene glycol  Allergies:  Allergies  Allergen Reactions   Aspirin Adult Low [Aspirin] Other (See Comments)    Stomach upset    Family History  Problem Relation Age of Onset   Cancer Mother    Heart attack Father        thinks he had MI in his 22s; died from saw accident     Social History:  reports that she quit smoking about 2 years ago. Her smoking use included cigarettes. She has a 15.00 pack-year smoking history. She has never used smokeless tobacco. She reports current alcohol use of about 84.0 standard drinks of alcohol per week. She reports that she does not currently use drugs after having used the following drugs:  Cocaine.  ROS: A complete review of systems was performed.  All systems are negative except for pertinent findings as noted.  Physical Exam:  Vital signs in last 24 hours: Temp:  [98.1 F (36.7 C)-98.8 F (37.1 C)] 98.6 F (37 C) (07/07 1143) Pulse Rate:  [87-91] 91 (07/07 1143) Resp:  [17-20] 17 (07/07 1143) BP: (109-131)/(69-84) 131/76 (07/07 1143) SpO2:  [95 %-99 %] 97 % (07/07 1143) Constitutional:  Alert and oriented, No acute distress Cardiovascular: Regular rate and rhythm, No JVD Respiratory: Normal respiratory effort GI: Abdomen is soft, nontender, nondistended, no abdominal masses GU: No CVA tenderness Lymphatic: No lymphadenopathy Neurologic: Grossly intact, no focal deficits Psychiatric: Normal mood and affect  Laboratory Data:  Recent Labs    09/24/21 2037 09/25/21 0955 09/26/21 0604  WBC 5.2 4.1 4.4  HGB 8.4*  8.8* 7.8*  HCT 27.4* 28.0* 25.2*  PLT 264 279 235    Recent Labs    09/24/21 2037 09/25/21 0955 09/26/21 0604  NA 139 141 138  K 4.3 4.5 5.1  CL 108 109 112*  GLUCOSE 84 139* 88  BUN 50* 44* 52*  CALCIUM 9.1 8.8* 8.8*  CREATININE 4.56* 4.50* 4.24*     Results for orders placed or performed during the hospital encounter of 09/24/21 (from the past 24 hour(s))  MRSA Next Gen by PCR, Nasal     Status: Abnormal   Collection Time: 09/25/21  6:02 PM   Specimen: Nasal Mucosa; Nasal Swab  Result Value Ref Range   MRSA by PCR Next Gen DETECTED (A) NOT DETECTED  Comprehensive metabolic panel     Status: Abnormal   Collection Time: 09/26/21  6:04 AM  Result Value Ref Range   Sodium 138 135 - 145 mmol/L   Potassium 5.1 3.5 - 5.1 mmol/L   Chloride 112 (H) 98 - 111 mmol/L   CO2 19 (L) 22 - 32 mmol/L   Glucose, Bld 88 70 - 99 mg/dL   BUN 52 (H) 6 - 20 mg/dL   Creatinine, Ser 4.24 (H) 0.44 - 1.00 mg/dL   Calcium 8.8 (L) 8.9 - 10.3 mg/dL   Total Protein 6.4 (L) 6.5 - 8.1 g/dL   Albumin 2.9 (L) 3.5 - 5.0 g/dL   AST 18 15 - 41 U/L   ALT 8 0 - 44  U/L   Alkaline Phosphatase 74 38 - 126 U/L   Total Bilirubin 0.5 0.3 - 1.2 mg/dL   GFR, Estimated 11 (L) >60 mL/min   Anion gap 7 5 - 15  CBC with Differential/Platelet     Status: Abnormal   Collection Time: 09/26/21  6:04 AM  Result Value Ref Range   WBC 4.4 4.0 - 10.5 K/uL   RBC 2.47 (L) 3.87 - 5.11 MIL/uL   Hemoglobin 7.8 (L) 12.0 - 15.0 g/dL   HCT 25.2 (L) 36.0 - 46.0 %   MCV 102.0 (H) 80.0 - 100.0 fL   MCH 31.6 26.0 - 34.0 pg   MCHC 31.0 30.0 - 36.0 g/dL   RDW 14.0 11.5 - 15.5 %   Platelets 235 150 - 400 K/uL   nRBC 0.0 0.0 - 0.2 %   Neutrophils Relative % 47 %   Neutro Abs 2.1 1.7 - 7.7 K/uL   Lymphocytes Relative 36 %   Lymphs Abs 1.6 0.7 - 4.0 K/uL   Monocytes Relative 13 %   Monocytes Absolute 0.6 0.1 - 1.0 K/uL   Eosinophils Relative 3 %   Eosinophils Absolute 0.1 0.0 - 0.5 K/uL   Basophils Relative 1 %   Basophils Absolute 0.0 0.0 - 0.1 K/uL   Immature Granulocytes 0 %   Abs Immature Granulocytes 0.01 0.00 - 0.07 K/uL   Recent Results (from the past 240 hour(s))  MRSA Next Gen by PCR, Nasal     Status: Abnormal   Collection Time: 09/25/21  6:02 PM   Specimen: Nasal Mucosa; Nasal Swab  Result Value Ref Range Status   MRSA by PCR Next Gen DETECTED (A) NOT DETECTED Final    Comment: RESULT CALLED TO, READ BACK BY AND VERIFIED WITH: RN P HAMILTON AT 2050 09/25/21 CRUICKSHANK A (NOTE) The GeneXpert MRSA Assay (FDA approved for NASAL specimens only), is one component of a comprehensive MRSA colonization surveillance program. It is not intended to diagnose MRSA infection nor to guide or monitor treatment  for MRSA infections. Test performance is not FDA approved in patients less than 13 years old. Performed at [facility], [place] [address] [place], [place] [address]     Renal Function: Recent Labs    09/24/21 2037 09/25/21 0955 09/26/21 0604  CREATININE 4.56* 4.50* 4.24*   Estimated Creatinine Clearance: 10.4 mL/min (A) (by C-G formula  based on SCr of 4.24 mg/dL (H)).  Radiologic Imaging: CT Soft Tissue Neck Wo Contrast  Result Date: 09/24/2021 CLINICAL DATA:  Left neck swelling.  History of lung carcinoma. EXAM: CT NECK WITHOUT CONTRAST TECHNIQUE: Multidetector CT imaging of the neck was performed following the standard protocol without intravenous contrast. RADIATION DOSE REDUCTION: This exam was performed according to the departmental dose-optimization program which includes automated exposure control, adjustment of the mA and/or kV according to patient size and/or use of iterative reconstruction technique. COMPARISON:  None Available. FINDINGS: Pharynx and larynx: Normal. No mass or swelling. Salivary glands: No inflammation, mass, or stone. Thyroid: Normal. Lymph nodes: There is a trans spatial left facial mass that abuts the inferior aspect of the left masseter muscle and measures 2.5 x 2.2 cm. There is a 2.2 cm left level 5A lymph node (image 55). Vascular: Unremarkable unenhanced appearance Limited intracranial: Negative. Visualized orbits: Negative. Mastoids and visualized paranasal sinuses: Bilateral maxillary sinus mucosal thickening. Small mastoid effusions. Skeleton: No acute or aggressive process. Upper chest: 17 mm nodule in the right upper lobe, more completely evaluated on concomitant chest CT. Other: None IMPRESSION: 1. Transspatial left facial mass that abuts the inferior aspect of the left masseter muscle, most consistent with metastatic disease. 2. A 2.2 cm left level 5A lymph node. 3. A 17 mm nodule in the right upper lobe, more completely evaluated on concomitant chest CT. Electronically Signed   By: Ulyses Jarred M.D.   On: 09/24/2021 21:31   CT CHEST ABDOMEN PELVIS WO CONTRAST  Result Date: 09/24/2021 CLINICAL DATA:  Metastatic disease evaluation known lung cancer has renal failure. Noticed lump on abdomen and upper chest r/o mass EXAM: CT CHEST, ABDOMEN AND PELVIS WITHOUT CONTRAST TECHNIQUE: Multidetector CT  imaging of the chest, abdomen and pelvis was performed following the standard protocol without IV contrast. RADIATION DOSE REDUCTION: This exam was performed according to the departmental dose-optimization program which includes automated exposure control, adjustment of the mA and/or kV according to patient size and/or use of iterative reconstruction technique. COMPARISON:  CT chest, abdomen, pelvis 03/11/2021 FINDINGS: CT CHEST FINDINGS Right chest wall Port-A-Cath with tip terminating in the right atrium. Cardiovascular: Normal heart size. No significant pericardial effusion. The thoracic aorta is normal in caliber. No atherosclerotic plaque of the thoracic aorta. At least 2 vessel coronary artery calcifications. Mediastinum/Nodes: No gross hilar adenopathy, noting limited sensitivity for the detection of hilar adenopathy on this noncontrast study. No enlarged mediastinal or axillary lymph nodes. Thyroid gland, trachea, and esophagus demonstrate no significant findings. Lungs/Pleura: No focal consolidation. Interval increase in size of a 3.8 x 4.2 cm right upper lobe mass with interval increase in size of a satellite nodule adjacent to it measuring 1.7 cm. Slight interval increase in size of a subpleural spiculated right lower lobe nodule measuring 1.5 x 1.5 cm (2:15). No right pleural effusion. Likely loculated at least moderate volume left pleural effusion. Query underlying left lower lobe mass lesion. No pneumothorax. Musculoskeletal: Multiple subcutaneus soft tissue nodules and masses with as an example a left breast 3.5 x 3.6 cm lesion (2:27). No suspicious lytic or blastic osseous lesions.  No acute displaced fracture. Multilevel degenerative changes of the spine. CT ABDOMEN PELVIS FINDINGS Hepatobiliary: No focal liver abnormality. Calcified gallstone noted within the gallbladder lumen. No gallbladder wall thickening or pericholecystic fluid. No biliary dilatation. Pancreas: No focal lesion. Normal  pancreatic contour. No surrounding inflammatory changes. No main pancreatic ductal dilatation. Spleen: Normal in size without focal abnormality. Adrenals/Urinary Tract: 3.3 x 2.2 cm right adrenal mass.  No left adrenal nodularity. No nephrolithiasis. Moderate right hydronephrosis with likely obstruction from mass lesion noted along the renal pelvis / hilum measuring 3.2 x 3.1 cm. No left hydronephrosis. Couple bilateral renal masses with as an example a 3.3 x 1.6 cm left lesion along the superior pole of the left kidney (2:58). Some of these may represent peritoneal implants adjacent to the kidneys with difficulty evaluating these on noncontrast study. No ureterolithiasis or hydroureter. The urinary bladder is unremarkable. Stomach/Bowel: Stomach is within normal limits. No evidence of bowel wall thickening or dilatation. Appendix appears normal. Vascular/Lymphatic: No abdominal aorta or iliac aneurysm. Moderate atherosclerotic plaque of the aorta and its branches. No abdominal, pelvic, or inguinal lymphadenopathy. Reproductive: Uterus and bilateral adnexa are unremarkable. Other: Right upper quadrant peritoneal implant measuring 2.3 x 2 cm (2:53). No intraperitoneal free fluid. No intraperitoneal free gas. No organized fluid collection. Musculoskeletal: Multiple subcutaneus soft tissue nodules and masses with as an example a anterior abdominal wall 4.3 x 3.1 cm (2:73). Ventral hernia containing fat with an abdominal defect of the defect of 2.4 cm and a subjacent hernia with an abdominal defect of 3.2 cm. No suspicious lytic or blastic osseous lesions. No acute displaced fracture. Multilevel degenerative changes of the spine. IMPRESSION: 1. Findings consistent with worsening metastatic disease. Markedly limited evaluation on this noncontrast study. 2. Moderate right hydronephrosis with likely obstruction from mass lesion noted along the renal pelvis/hilum measuring 3.2 x 3.1 cm. 3. Interval increase in size of the  right upper lobe dominant mass, adjacent right upper lobe satellite nodule, and known right lower lobe subpleural nodule. 4. Similar-appearing likely loculated at least moderate volume left pleural effusion. Query underlying left lower lobe mass lesion. 5. Interval development of bilateral renal masses/metastases. 6. Interval development of right adrenal metastasis. 7. Interval development of peritoneal implant/lesions. 8. Interval development of multiple subcutaneus soft tissue metastases. 9. Cholelithiasis with no CT evidence of acute cholecystitis. 10. Fat containing ventral wall hernias. 11.  Aortic Atherosclerosis (ICD10-I70.0). Electronically Signed   By: Iven Finn M.D.   On: 09/24/2021 21:23   CT HEAD WO CONTRAST (5MM)  Result Date: 09/24/2021 CLINICAL DATA:  Brain metastasis suspected, headache. History of lung cancer. EXAM: CT HEAD WITHOUT CONTRAST TECHNIQUE: Contiguous axial images were obtained from the base of the skull through the vertex without intravenous contrast. RADIATION DOSE REDUCTION: This exam was performed according to the departmental dose-optimization program which includes automated exposure control, adjustment of the mA and/or kV according to patient size and/or use of iterative reconstruction technique. COMPARISON:  04/17/2021. FINDINGS: Brain: Diffuse atrophy is noted. Periventricular white matter hypodensities are noted bilaterally. No hydrocephalus. There is redemonstration of a cystic lesion in the parietal lobe on the right measuring 3.2 x 2.2 cm with associated mass effect, decreased in size from the prior exam. A 1.1 cm lesion is noted in the frontal lobe anteriorly with no significant mass effect and smaller as compared with the prior exam. Both lesions have a small thin hyperdense rim inferiorly in the possibility of blood products can not be excluded. No definite new lesions  are seen. Evaluation for metastatic disease is limited due to lack of IV contrast. No midline  shift. Vascular: Atherosclerotic calcification of the carotid siphons. No hyperdense vessel. Skull: Normal. Negative for fracture or focal lesion. Sinuses/Orbits: There is partial opacification of the right maxillary sinus and left sphenoid sinus. The orbits are within normal limits. Other: There is partial opacification of the mastoid air cells bilaterally. IMPRESSION: 1. Known cystic metastatic lesions in the parietal and frontal lobes on the right are decreased in size from the prior exam with reduced mass effect. A thin hyperdense rim is noted in both lesions inferiorly in the possibility of blood products can not be completely excluded. MRI is recommended for further evaluation. 2. Atrophy with chronic microvascular ischemic changes. Electronically Signed   By: Brett Fairy M.D.   On: 09/24/2021 21:20    I independently reviewed the above imaging studies.  Impression/Recommendation: Right ureteral obstruction with acute kidney injury: I had a discussion with Ms. Leinweber and also with her niece, Ms. Melody Haver (who is a Designer, jewellery) regarding her situation.  We reviewed her overall prognosis related to her metastatic lung cancer which is not good.  She has been recommended to consider palliative care and hospice care moving forward.  We discussed her acute on chronic kidney injury and options for management.  It was explained that 1 option would be continued observation without intervention considering that she is asymptomatic.  It was understood that this may not be particularly progressive in the near future but if she did develop renal failure that this would be a relatively painless end-of-life situation.  We discussed that intervention with either percutaneous nephrostomy or cystoscopy and retrograde ureteral stent placement were options that may improve her renal function although this would likely only delay the inevitable end-of-life scenario considering her prognosis from her  metastatic malignancy.  We also discussed the option of a palliative care consult to help with decision-making and goals of care.  Ultimately, the patient and her niece currently wish to proceed with cystoscopy and right ureteral stent placement to see if this would improve her renal function.  They do understand that she could develop increased urinary frequency/urgency symptoms and possibly flank or pelvic pain.  However, they also understand that we can always remove her ureteral stent if she does not tolerate it well.  They do wish to proceed and she will tentatively be scheduled for cystoscopy and right ureteral stent placement tomorrow.  She will be placed n.p.o. after midnight.  Dutch Gray 09/26/2021, 5:10 PM  Pryor Curia. MD   CC: Dr. Verneita Griffes

## 2021-09-26 NOTE — Progress Notes (Addendum)
PROGRESS NOTE   Brenda [name]  OFB:[id number] DOB: 1960/08/31 DOA: 09/24/2021 PCP: Pcp, No  Brief Narrative:   61 year old black female Brenda [name] resident 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 Anemia of chronic disease/renal disease Prior alcohol abuse Adult failure to thrive At baseline oriented to person only  Niece Brenda [name] is HCPOA  Brought over from skilled facility with facial + neck swelling X 24 hours-on further evaluation noted tender lymph node left side of neck Work-up revealed relatively normal vitals but BUNs/creatinine up to 50/4.5 over baseline of 30/2.6 LFTs normal Hemoglobin 8.4 platelet 264 MCV 1 2 WBC 5.2 CT abdomen pelvis = metastatic disease hydronephrosis obstruction from mass lesion along right renal pelvis 3.2 X3.1 interval development of bilateral renal masses and right adrenal metastases and peritoneal implants lesions and multiple subcutaneous soft tissue mets CT head metastatic lesion parietal frontal lobes decreased in size Transfacial left facial mass abutting inferior aspect of masseter?  Metastatic disease 2 cm lymph node 17 mm right upper lobe nodule    Hospital-Problem based course  Stage IV lung cancer with brain mets and now overall facial as well as abdominal metastases Oncology has seen the patient patient is not a candidate for further therapies They recommend hospice Continuing at this time Keppra 500 twice daily Hold gabapentin secondary to risk of encephalopathy with renal insufficiency AKI likely secondary to mass effect from metastatic disease of lung tumor CKD 3B at baseline with baseline creatinine 2.5 Trial IV fluid 75 cc/H and monitor trends Discussed with patient and with niece--- d/w  IR--they recommend consideration for Retrograde stent to area--d/w Dr. Alinda [name] Urology who will see and d/w family HFrEF HFpEF Continue Coreg 3.125 twice daily Microcytic anemia Component probably related to malignancy   DVT prophylaxis: scd Code Status: full Family Communication: called niece Brenda [name] [phone] that current trajectory seems to be terminal Oncology recommends DNR and hospice We will attempt to get percutaneous nephrostomy placed in the next several days as a temporizing measure-niece understands that this is not curative Disposition:  Status is: Observation The patient will require care spanning > 2 midnights and should be moved to inpatient because:   Does need discussion of goals of care   Consultants:  Oncology  Procedures:   Antimicrobials:     Subjective:  Awake coherent somewhat tangential no distress  Objective: Vitals:   09/25/21 2026 09/26/21 0413 09/26/21 1000 09/26/21 1143  BP: 122/84 126/69 109/78 131/76  Pulse: 87 91 91 91  Resp: 20 18  17   Temp: 98.1 F (36.7 C) 98.8 F (37.1 C)  98.6 F (37 C)  TempSrc: Oral Oral  Oral  SpO2: 99% 95%  97%  Weight:      Height:        Intake/Output Summary (Last 24 hours) at 09/26/2021 1349 Last data filed at 09/26/2021 6761 Gross per 24 hour  Intake 1550.35 ml  Output 400 ml  Net 1150.35 ml   Filed Weights   09/24/21 2030  Weight: 46 kg    Examination:  EOMI NCAT no focal deficit Chest clear no added sound rales rhonchi Abdomen is soft but she does have some subcutaneous masses present throughout ROM is intact-she is coherent but tangential at times Psych is euthymic  Data Reviewed: personally reviewed   CBC  Component Value Date/Time   WBC 4.4 09/26/2021 0604   RBC 2.47 (L) 09/26/2021 0604   HGB 7.8 (L) 09/26/2021 0604   HGB 9.4 (L) 06/18/2020 1126   HCT 25.2 (L) 09/26/2021 0604   PLT 235 09/26/2021 0604   PLT 248 06/18/2020 1126   MCV 102.0 (H) 09/26/2021  0604   MCH 31.6 09/26/2021 0604   MCHC 31.0 09/26/2021 0604   RDW 14.0 09/26/2021 0604   LYMPHSABS 1.6 09/26/2021 0604   MONOABS 0.6 09/26/2021 0604   EOSABS 0.1 09/26/2021 0604   BASOSABS 0.0 09/26/2021 0604      Latest Ref Rng & Units 09/26/2021    6:04 AM 09/25/2021    9:55 AM 09/24/2021   11:30 PM  CMP  Glucose 70 - 99 mg/dL 88  139    BUN 6 - 20 mg/dL 52  44    Creatinine 0.44 - 1.00 mg/dL 4.24  4.50    Sodium 135 - 145 mmol/L 138  141    Potassium 3.5 - 5.1 mmol/L 5.1  4.5    Chloride 98 - 111 mmol/L 112  109    CO2 22 - 32 mmol/L 19  22    Calcium 8.9 - 10.3 mg/dL 8.8  8.8    Total Protein 6.5 - 8.1 g/dL 6.4  7.0  7.3   Total Bilirubin 0.3 - 1.2 mg/dL 0.5  0.4  0.4   Alkaline Phos 38 - 126 U/L 74  81  85   AST 15 - 41 U/L 18  20  18    ALT 0 - 44 U/L 8  8  8       Radiology Studies: CT Soft Tissue Neck Wo Contrast  Result Date: 09/24/2021 CLINICAL DATA:  Left neck swelling.  History of lung carcinoma. EXAM: CT NECK WITHOUT CONTRAST TECHNIQUE: Multidetector CT imaging of the neck was performed following the standard protocol without intravenous contrast. RADIATION DOSE REDUCTION: This exam was performed according to the departmental dose-optimization program which includes automated exposure control, adjustment of the mA and/or kV according to patient size and/or use of iterative reconstruction technique. COMPARISON:  None Available. FINDINGS: Pharynx and larynx: Normal. No mass or swelling. Salivary glands: No inflammation, mass, or stone. Thyroid: Normal. Lymph nodes: There is a trans spatial left facial mass that abuts the inferior aspect of the left masseter muscle and measures 2.5 x 2.2 cm. There is a 2.2 cm left level 5A lymph node (image 55). Vascular: Unremarkable unenhanced appearance Limited intracranial: Negative. Visualized orbits: Negative. Mastoids and visualized paranasal sinuses: Bilateral maxillary sinus mucosal thickening. Small mastoid effusions. Skeleton: No acute  or aggressive process. Upper chest: 17 mm nodule in the right upper lobe, more completely evaluated on concomitant chest CT. Other: None IMPRESSION: 1. Transspatial left facial mass that abuts the inferior aspect of the left masseter muscle, most consistent with metastatic disease. 2. A 2.2 cm left level 5A lymph node. 3. A 17 mm nodule in the right upper lobe, more completely evaluated on concomitant chest CT. Electronically Signed   By: Ulyses Jarred M.D.   On: 09/24/2021 21:31   CT CHEST ABDOMEN PELVIS WO CONTRAST  Result Date: 09/24/2021 CLINICAL DATA:  Metastatic disease evaluation known lung cancer has renal failure. Noticed lump on abdomen and upper chest r/o mass EXAM: CT CHEST, ABDOMEN AND PELVIS WITHOUT CONTRAST TECHNIQUE: Multidetector CT imaging of the chest, abdomen and pelvis was performed following the standard protocol without IV contrast. RADIATION DOSE REDUCTION: This exam was performed according to  the departmental dose-optimization program which includes automated exposure control, adjustment of the mA and/or kV according to patient size and/or use of iterative reconstruction technique. COMPARISON:  CT chest, abdomen, pelvis 03/11/2021 FINDINGS: CT CHEST FINDINGS Right chest wall Port-A-Cath with tip terminating in the right atrium. Cardiovascular: Normal heart size. No significant pericardial effusion. The thoracic aorta is normal in caliber. No atherosclerotic plaque of the thoracic aorta. At least 2 vessel coronary artery calcifications. Mediastinum/Nodes: No gross hilar adenopathy, noting limited sensitivity for the detection of hilar adenopathy on this noncontrast study. No enlarged mediastinal or axillary lymph nodes. Thyroid gland, trachea, and esophagus demonstrate no significant findings. Lungs/Pleura: No focal consolidation. Interval increase in size of a 3.8 x 4.2 cm right upper lobe mass with interval increase in size of a satellite nodule adjacent to it measuring 1.7 cm. Slight  interval increase in size of a subpleural spiculated right lower lobe nodule measuring 1.5 x 1.5 cm (2:15). No right pleural effusion. Likely loculated at least moderate volume left pleural effusion. Query underlying left lower lobe mass lesion. No pneumothorax. Musculoskeletal: Multiple subcutaneus soft tissue nodules and masses with as an example a left breast 3.5 x 3.6 cm lesion (2:27). No suspicious lytic or blastic osseous lesions. No acute displaced fracture. Multilevel degenerative changes of the spine. CT ABDOMEN PELVIS FINDINGS Hepatobiliary: No focal liver abnormality. Calcified gallstone noted within the gallbladder lumen. No gallbladder wall thickening or pericholecystic fluid. No biliary dilatation. Pancreas: No focal lesion. Normal pancreatic contour. No surrounding inflammatory changes. No main pancreatic ductal dilatation. Spleen: Normal in size without focal abnormality. Adrenals/Urinary Tract: 3.3 x 2.2 cm right adrenal mass.  No left adrenal nodularity. No nephrolithiasis. Moderate right hydronephrosis with likely obstruction from mass lesion noted along the renal pelvis / hilum measuring 3.2 x 3.1 cm. No left hydronephrosis. Couple bilateral renal masses with as an example a 3.3 x 1.6 cm left lesion along the superior pole of the left kidney (2:58). Some of these may represent peritoneal implants adjacent to the kidneys with difficulty evaluating these on noncontrast study. No ureterolithiasis or hydroureter. The urinary bladder is unremarkable. Stomach/Bowel: Stomach is within normal limits. No evidence of bowel wall thickening or dilatation. Appendix appears normal. Vascular/Lymphatic: No abdominal aorta or iliac aneurysm. Moderate atherosclerotic plaque of the aorta and its branches. No abdominal, pelvic, or inguinal lymphadenopathy. Reproductive: Uterus and bilateral adnexa are unremarkable. Other: Right upper quadrant peritoneal implant measuring 2.3 x 2 cm (2:53). No intraperitoneal free  fluid. No intraperitoneal free gas. No organized fluid collection. Musculoskeletal: Multiple subcutaneus soft tissue nodules and masses with as an example a anterior abdominal wall 4.3 x 3.1 cm (2:73). Ventral hernia containing fat with an abdominal defect of the defect of 2.4 cm and a subjacent hernia with an abdominal defect of 3.2 cm. No suspicious lytic or blastic osseous lesions. No acute displaced fracture. Multilevel degenerative changes of the spine. IMPRESSION: 1. Findings consistent with worsening metastatic disease. Markedly limited evaluation on this noncontrast study. 2. Moderate right hydronephrosis with likely obstruction from mass lesion noted along the renal pelvis/hilum measuring 3.2 x 3.1 cm. 3. Interval increase in size of the right upper lobe dominant mass, adjacent right upper lobe satellite nodule, and known right lower lobe subpleural nodule. 4. Similar-appearing likely loculated at least moderate volume left pleural effusion. Query underlying left lower lobe mass lesion. 5. Interval development of bilateral renal masses/metastases. 6. Interval development of right adrenal metastasis. 7. Interval development of peritoneal implant/lesions. 8. Interval development of multiple  subcutaneus soft tissue metastases. 9. Cholelithiasis with no CT evidence of acute cholecystitis. 10. Fat containing ventral wall hernias. 11.  Aortic Atherosclerosis (ICD10-I70.0). Electronically Signed   By: Iven Finn M.D.   On: 09/24/2021 21:23   CT HEAD WO CONTRAST (5MM)  Result Date: 09/24/2021 CLINICAL DATA:  Brain metastasis suspected, headache. History of lung cancer. EXAM: CT HEAD WITHOUT CONTRAST TECHNIQUE: Contiguous axial images were obtained from the base of the skull through the vertex without intravenous contrast. RADIATION DOSE REDUCTION: This exam was performed according to the departmental dose-optimization program which includes automated exposure control, adjustment of the mA and/or kV  according to patient size and/or use of iterative reconstruction technique. COMPARISON:  04/17/2021. FINDINGS: Brain: Diffuse atrophy is noted. Periventricular white matter hypodensities are noted bilaterally. No hydrocephalus. There is redemonstration of a cystic lesion in the parietal lobe on the right measuring 3.2 x 2.2 cm with associated mass effect, decreased in size from the prior exam. A 1.1 cm lesion is noted in the frontal lobe anteriorly with no significant mass effect and smaller as compared with the prior exam. Both lesions have a small thin hyperdense rim inferiorly in the possibility of blood products can not be excluded. No definite new lesions are seen. Evaluation for metastatic disease is limited due to lack of IV contrast. No midline shift. Vascular: Atherosclerotic calcification of the carotid siphons. No hyperdense vessel. Skull: Normal. Negative for fracture or focal lesion. Sinuses/Orbits: There is partial opacification of the right maxillary sinus and left sphenoid sinus. The orbits are within normal limits. Other: There is partial opacification of the mastoid air cells bilaterally. IMPRESSION: 1. Known cystic metastatic lesions in the parietal and frontal lobes on the right are decreased in size from the prior exam with reduced mass effect. A thin hyperdense rim is noted in both lesions inferiorly in the possibility of blood products can not be completely excluded. MRI is recommended for further evaluation. 2. Atrophy with chronic microvascular ischemic changes. Electronically Signed   By: Brett Fairy M.D.   On: 09/24/2021 21:20     Scheduled Meds:  carvedilol  3.125 mg Oral BID   Chlorhexidine Gluconate Cloth  6 each Topical Q0600   docusate sodium  100 mg Oral BID   feeding supplement  237 mL Oral BID BM   ferrous sulfate  325 mg Oral Q breakfast   folic acid  1 mg Oral Daily   levETIRAcetam  500 mg Oral BID   mupirocin ointment  1 Application Nasal BID   pantoprazole  40 mg  Oral Daily   senna  1 tablet Oral BID   thiamine  100 mg Oral Daily   Continuous Infusions:  sodium chloride 75 mL/hr at 09/26/21 0317     LOS: 1 day   Time spent: [place], MD [facility] To contact the attending provider between 7A-7P or the covering provider during after hours 7P-7A, please log into the web site [email or web address] and access using universal  AFB password for that web site. If you do not have the password, please call the hospital operator.  09/26/2021, 1:49 PM

## 2021-09-27 ENCOUNTER — Inpatient Hospital Stay (HOSPITAL_COMMUNITY): Payer: Medicaid Other | Admitting: Anesthesiology

## 2021-09-27 ENCOUNTER — Inpatient Hospital Stay (HOSPITAL_COMMUNITY): Payer: Medicaid Other

## 2021-09-27 ENCOUNTER — Encounter (HOSPITAL_COMMUNITY)
Admission: EM | Disposition: A | Discharge status: Skilled Nursing Facility | Payer: Self-pay | Source: Home / Self Care | Attending: Family Medicine

## 2021-09-27 DIAGNOSIS — Z7189 Other specified counseling: Secondary | ICD-10-CM | POA: Diagnosis not present

## 2021-09-27 DIAGNOSIS — C799 Secondary malignant neoplasm of unspecified site: Secondary | ICD-10-CM | POA: Diagnosis not present

## 2021-09-27 DIAGNOSIS — C3490 Malignant neoplasm of unspecified part of unspecified bronchus or lung: Secondary | ICD-10-CM

## 2021-09-27 DIAGNOSIS — D630 Anemia in neoplastic disease: Secondary | ICD-10-CM

## 2021-09-27 DIAGNOSIS — N135 Crossing vessel and stricture of ureter without hydronephrosis: Secondary | ICD-10-CM | POA: Diagnosis not present

## 2021-09-27 DIAGNOSIS — N179 Acute kidney failure, unspecified: Secondary | ICD-10-CM | POA: Diagnosis not present

## 2021-09-27 DIAGNOSIS — Z515 Encounter for palliative care: Secondary | ICD-10-CM | POA: Diagnosis not present

## 2021-09-27 HISTORY — PX: CYSTOSCOPY W/ URETERAL STENT PLACEMENT: SHX1429

## 2021-09-27 LAB — BASIC METABOLIC PANEL
Anion gap: 6 (ref 5–15)
BUN: 54 mg/dL — ABNORMAL HIGH (ref 6–20)
CO2: 18 mmol/L — ABNORMAL LOW (ref 22–32)
Calcium: 8.4 mg/dL — ABNORMAL LOW (ref 8.9–10.3)
Chloride: 116 mmol/L — ABNORMAL HIGH (ref 98–111)
Creatinine, Ser: 4.07 mg/dL — ABNORMAL HIGH (ref 0.44–1.00)
GFR, Estimated: 12 mL/min — ABNORMAL LOW (ref >60)
Glucose, Bld: 109 mg/dL — ABNORMAL HIGH (ref 70–99)
Potassium: 5.2 mmol/L — ABNORMAL HIGH (ref 3.5–5.1)
Sodium: 140 mmol/L (ref 135–145)

## 2021-09-27 SURGERY — CYSTOSCOPY, WITH RETROGRADE PYELOGRAM AND URETERAL STENT INSERTION
Anesthesia: General | Site: Ureter | Laterality: Right

## 2021-09-27 MED ORDER — ACETAMINOPHEN 500 MG PO TABS: 1000.0000 mg | ORAL_TABLET | Freq: Once | ORAL | Status: DC | PRN

## 2021-09-27 MED ORDER — ONDANSETRON HCL 4 MG/2ML IJ SOLN
INTRAMUSCULAR | Status: DC | PRN
  Administered 2021-09-27: 4 mg via INTRAVENOUS

## 2021-09-27 MED ORDER — LIDOCAINE HCL (CARDIAC) PF 100 MG/5ML IV SOSY
PREFILLED_SYRINGE | INTRAVENOUS | Status: DC | PRN
  Administered 2021-09-27: 60 mg via INTRAVENOUS

## 2021-09-27 MED ORDER — SODIUM CHLORIDE 0.9% FLUSH: 10.0000 mL | INTRAVENOUS | Status: DC | PRN

## 2021-09-27 MED ORDER — IOHEXOL 300 MG/ML  SOLN
INTRAMUSCULAR | Status: DC | PRN
  Administered 2021-09-27: 10 mL

## 2021-09-27 MED ORDER — CEFAZOLIN SODIUM-DEXTROSE 2-4 GM/100ML-% IV SOLN
INTRAVENOUS | Status: AC
  Filled 2021-09-27: qty 100

## 2021-09-27 MED ORDER — LORAZEPAM 2 MG/ML IJ SOLN
0.5000 mg | Freq: Four times a day (QID) | INTRAMUSCULAR | Status: DC | PRN
  Administered 2021-09-27 – 2021-09-29 (×4): 0.5 mg via INTRAVENOUS
  Filled 2021-09-27 (×4): qty 1

## 2021-09-27 MED ORDER — CEFAZOLIN SODIUM-DEXTROSE 2-3 GM-%(50ML) IV SOLR
INTRAVENOUS | Status: DC | PRN
  Administered 2021-09-27: 2 g via INTRAVENOUS

## 2021-09-27 MED ORDER — OXYCODONE HCL 5 MG/5ML PO SOLN: 5.0000 mg | Freq: Once | ORAL | Status: DC | PRN

## 2021-09-27 MED ORDER — PROPOFOL 10 MG/ML IV BOLUS
INTRAVENOUS | Status: DC | PRN
  Administered 2021-09-27: 100 mg via INTRAVENOUS

## 2021-09-27 MED ORDER — ACETAMINOPHEN 160 MG/5ML PO SOLN: 1000.0000 mg | Freq: Once | ORAL | Status: DC | PRN

## 2021-09-27 MED ORDER — FENTANYL CITRATE (PF) 100 MCG/2ML IJ SOLN
INTRAMUSCULAR | Status: AC
  Filled 2021-09-27: qty 2

## 2021-09-27 MED ORDER — LIDOCAINE HCL (PF) 2 % IJ SOLN
INTRAMUSCULAR | Status: AC
  Filled 2021-09-27: qty 5

## 2021-09-27 MED ORDER — PROPOFOL 500 MG/50ML IV EMUL
INTRAVENOUS | Status: DC | PRN
  Administered 2021-09-27: 100 ug/kg/min via INTRAVENOUS

## 2021-09-27 MED ORDER — SODIUM CHLORIDE 0.9% FLUSH
10.0000 mL | Freq: Two times a day (BID) | INTRAVENOUS | Status: DC
  Administered 2021-09-28 – 2021-09-29 (×2): 10 mL

## 2021-09-27 MED ORDER — OXYCODONE HCL 5 MG PO TABS: 5.0000 mg | ORAL_TABLET | Freq: Once | ORAL | Status: DC | PRN

## 2021-09-27 MED ORDER — FENTANYL CITRATE PF 50 MCG/ML IJ SOSY: 25.0000 ug | PREFILLED_SYRINGE | INTRAMUSCULAR | Status: DC | PRN

## 2021-09-27 MED ORDER — ONDANSETRON HCL 4 MG/2ML IJ SOLN
INTRAMUSCULAR | Status: AC
Start: 2021-09-27 — End: ?
  Filled 2021-09-27: qty 2

## 2021-09-27 MED ORDER — PROPOFOL 10 MG/ML IV BOLUS
INTRAVENOUS | Status: AC
  Filled 2021-09-27: qty 20

## 2021-09-27 MED ORDER — PROPOFOL 500 MG/50ML IV EMUL
INTRAVENOUS | Status: AC
  Filled 2021-09-27: qty 50

## 2021-09-27 MED ORDER — ACETAMINOPHEN 10 MG/ML IV SOLN: 1000.0000 mg | Freq: Once | INTRAVENOUS | Status: DC | PRN

## 2021-09-27 MED ORDER — STERILE WATER FOR IRRIGATION IR SOLN
Status: DC | PRN
  Administered 2021-09-27: 3000 mL

## 2021-09-27 MED ORDER — FENTANYL CITRATE (PF) 100 MCG/2ML IJ SOLN
INTRAMUSCULAR | Status: DC | PRN
  Administered 2021-09-27 (×2): 25 ug via INTRAVENOUS

## 2021-09-27 SURGICAL SUPPLY — 15 items
BAG URO CATCHER STRL LF (MISCELLANEOUS) ×2 IMPLANT
CATH URETL OPEN END 6FR 70 (CATHETERS) IMPLANT
CLOTH BEACON ORANGE TIMEOUT ST (SAFETY) ×2 IMPLANT
GLOVE SURG LX 7.5 STRW (GLOVE) ×1
GLOVE SURG LX STRL 7.5 STRW (GLOVE) ×1 IMPLANT
GOWN STRL REUS W/ TWL XL LVL3 (GOWN DISPOSABLE) ×1 IMPLANT
GOWN STRL REUS W/TWL XL LVL3 (GOWN DISPOSABLE) ×1
GUIDEWIRE STR DUAL SENSOR (WIRE) ×2 IMPLANT
GUIDEWIRE ZIPWRE .038 STRAIGHT (WIRE) IMPLANT
KIT TURNOVER KIT A (KITS) IMPLANT
MANIFOLD NEPTUNE II (INSTRUMENTS) ×2 IMPLANT
PACK CYSTO (CUSTOM PROCEDURE TRAY) ×2 IMPLANT
STENT URET 6FRX24 CONTOUR (STENTS) ×1 IMPLANT
TUBING CONNECTING 10 (TUBING) ×2 IMPLANT
TUBING UROLOGY SET (TUBING) IMPLANT

## 2021-09-27 NOTE — Progress Notes (Signed)
Patient ID: Brenda [name], female   DOB: 07/14/1960, 61 y.o.   MRN: [id number]  Day of Surgery Subjective: Pt with episode of confusion and hallucinations last night.  This has been recurrent according to her niece and her history.  She remains hemodynamically stable with no change in her respiratory status.  Objective: Vital signs in last 24 hours: Temp:  [97.7 F (36.5 C)-98.6 F (37 C)] 97.7 F (36.5 C) (07/08 9201) Pulse Rate:  [91-102] 102 (07/08 0608) Resp:  [17-18] 18 (07/08 0608) BP: (109-150)/(76-97) 150/97 (07/08 0608) SpO2:  [97 %-98 %] 98 % (07/08 0071)  Intake/Output from previous day: 07/07 0701 - 07/08 0700 In: 2619.5 [P.O.:830; I.V.:1789.5] Out: -  Intake/Output this shift: No intake/output data recorded.  Physical Exam:  General: Alert.  Not oriented to place or time (I'm at [facility]) Abdomen: Soft, ND  Lab Results: Recent Labs    09/24/21 2037 09/25/21 0955 09/26/21 0604  HGB 8.4* 8.8* 7.8*  HCT 27.4* 28.0* 25.2*   BMET Recent Labs    09/26/21 0604 09/27/21 0117  NA 138 140  K 5.1 5.2*  CL 112* 116*  CO2 19* 18*  GLUCOSE 88 109*  BUN 52* 54*  CREATININE 4.24* 4.07*  CALCIUM 8.8* 8.4*     Studies/Results: No results found.  Assessment/Plan: 1) Right ureteral obstruction: Discussed situation and overnight events with patient's niece, Brenda [name], who states that Brenda [name] confirmed her decision last night during their discussion to proceed with right ureteral stent for obstruction and AKI.  I discussed the potential benefits and risks of the procedure, side effects of the proposed treatment, the likelihood of the patient achieving the goals of the procedure, and any potential problems that might occur during the procedure or recuperation. Her and her niece have given informed consent to proceed later this morning.    LOS: 2 days   Dutch Gray 09/27/2021, 7:48 AM

## 2021-09-27 NOTE — Consult Note (Signed)
Palliative Care Consult Note                                  Date: 09/27/2021   Patient Name: Brenda [name]  DOB: [birth year]  MRN: [id number]  Age / Sex: 61 y.o., female  PCP: Pcp, No Referring Physician: Nita Sells, MD  Reason for Consultation: Establishing goals of care  HPI/Patient Profile: 61 y.o. 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   PMT was consulted for goals of care conversations.  The patient was previously seen by palliative care as an inpatient.  At that time family understood that her disease was terminal.  They understood futility of resuscitative measures however patient had previously expressed desire to be full code and they were trying to respect those wishes.  Subsequently patient remained a full code.  The patient is also being followed by [facility] collective outpatient palliative care.  Past Medical History:  Diagnosis Date   Breast cancer ([place])    Depression    Mental disorder     Subjective:   This NP Walden Field reviewed medical records, received report from team, assessed the patient and then meet at the patient's bedside to discuss diagnosis, prognosis, GOC, EOL wishes disposition and options.  I met with with the patient at the bedside.  She was sleeping when I entered and when she awoke she seemed to be oriented x3, but frequently dozing off.  She agreed for me to call her niece  to discuss her medical situation.   Concept of Palliative Care was introduced as specialized medical care for people and their families living with serious illness.  If focuses on providing relief from the symptoms and stress of a serious illness.  The goal is to improve quality of life for both the patient and the family. Values and goals of care important to patient and family were attempted to be elicited.  Created space and opportunity for patient  and family to explore thoughts and feelings regarding current medical situation   Natural trajectory and current clinical status were discussed. Questions and concerns addressed. Patient  encouraged to call with questions or concerns.    Patient/Family Understanding of Illness: The patient is not able to really communicate much today as she is quite sleepy, NPO.  I called and spoke with the patient's niece at the patient's request.  She understands that her aunts cancer is end-stage and she has metastasis to the jaw, abdomen, breast, kidney, brain.  She is states that they have agreed to cystoscopy and stent placement to try to open up her urinary system to help with AKI and maybe allow for a couple more months time versus a couple weeks.  They are trying to keep her here as long as possible but with a likewise focus on comfort and dignity.  They understand there is no more treatment options from a cancer perspective.  Life Review: Deferred  Patient Values: Family, faith  Goals: To buy as  much time as possible with nephrostomy tube, discharge to hospice  Today's Discussion: I called and spoke with the patient's niece who is listed in the hospitalist note as the power of attorney.  The niece says that she does not think there is anything sign, however spiritual care was planning to come to have HCPOA document signed per patient's request naming the niece.  The patient asked me to call her niece to talk about her care.  Additionally, the patient's  niece says that she has been making decisions and signing paperwork at the long-term care facility [facility]) for her aunt.  In addition to the discussions above we had extensive discussions on multiple topics.  I informed the patient's niece that she is down in the OR for the nephrostomy tube and stenting.  I asked if they have been offered hospice knowing that this is end-stage and incurable and she says they have.  She states that the patient and family have agreed to hospice but they are not sure which agency to use.  I discussed multiple agencies in the area and the patient's niece has experience with [employer] and prefers them as the hospice agency.  She has agreed for a consult for hospice evaluation.  The preference is for "home" hospice at the long-term care facility [facility] where the patient currently lives.  I brought up CODE STATUS discussion.  The patient's niece agrees that the patient should be a DNR.  However, she feels that this is a decision that the patient should make.  I expressed that being that she is down in the OR and would likely be somnolent from anesthesia for the remainder of the day it is not a good discussion to have today.  However, I offered that a colleague could follow-up tomorrow to try to discuss CODE STATUS with the patient given her incurable disease.  I provided contact information for our team should the patient or family have questions or concerns.  I provided emotional and general support through therapeutic listening, empathy, sharing of stories, and other techniques.  Answered all questions and addressed all concerns to the best of my ability.  Review of Systems  Constitutional:  Positive for fatigue.  Gastrointestinal:  Negative for abdominal pain, nausea and vomiting.    Objective:   Primary Diagnoses: Present on Admission:  AKI (acute kidney injury) ([place])  Acute on chronic anemia  Malignant neoplasm of right lung (HCC)   Stage 3b chronic kidney disease (CKD) (HCC)  Protein-calorie malnutrition, severe  Primary malignant neoplasm of lung with metastasis to brain (HCC)  Pleural effusion  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 (HCC)  Chronic combined systolic and diastolic congestive heart failure (HCC)  Anemia of chronic disease  Breast mass  Essential hypertension   Physical Exam Vitals and nursing note reviewed.  Constitutional:      General: She is sleeping. She is not in acute distress. HENT:     Head: Normocephalic and atraumatic.  Cardiovascular:     Rate and Rhythm: Normal rate.  Pulmonary:     Effort: Pulmonary effort is normal. No respiratory distress.  Abdominal:     General: Abdomen is flat.     Palpations: Abdomen is soft.  Skin:    General: Skin is warm and dry.  Neurological:     General: No focal deficit present.     Mental Status: She is oriented to person, place, and time and easily aroused.     Vital Signs:  BP 131/82 (BP Location:  Left Arm)   Pulse 95   Temp 98.2 F (36.8 C) (Oral)   Resp 18   Ht _0  (1.676 m)   Wt 46 kg   LMP 01/19/2011   SpO2 97%   BMI 16.37 kg/m   Palliative Assessment/Data: 30-40%    Advanced Care Planning:   Primary Decision Maker: PATIENT with assistance of neice  Code Status/Advance Care Planning: Full code  A discussion was had today regarding advanced directives. Concepts specific to code status, artifical feeding and hydration, continued IV antibiotics and rehospitalization was had.  The difference between a aggressive medical intervention path and a palliative comfort care path for this patient at this time was had. Patient's niece prefers patient to make decision on code status if she's able to  Decisions/Changes to ACP: None today  Assessment & Plan:   Impression: 61 year old female with known stage IV lung cancer with progressive metastasis who was lost to follow-up with the cancer center for the past 2 years.  Now with enlarging brain  mets, personality changes, abdominal and pelvic metastasis, including obstructive hydronephrosis from tumor burden.  Oncology has seen her and feels she is not a candidate for additional systemic treatment because of poor performance status and other comorbidities.  Niece/decision maker expresses family and patient on board with hospice. Requests CODE STATUS discussion with patient if able. Overall prognosis is poor/grave.  SUMMARY OF RECOMMENDATIONS   Attempt CODE STATUS discussion tomorrow after clear from anesthesia TOC consult for hospice evaluation with [facility] collective Spiritual care consult for HCPOA paperwork completion if able PMT will continue to follow  Symptom Management:  Per primary team PMT is available to assist as needed  Prognosis:  < 6 months  Discharge Planning:  Long-term care facility with hospice services    Discussed with: Patient's family, medical team, nursing team, [facility] team    Thank you for allowing [place] to participate in the care of Ivett Luebbe PMT will continue to support holistically.  Time Total: 80 min  Greater than 50%  of this time was spent counseling and coordinating care related to the above assessment and plan.  Signed by: Walden Field, NP Palliative Medicine Team  Team Phone # [phone] (Nights/Weekends)  09/27/2021, 2:14 PM

## 2021-09-27 NOTE — Progress Notes (Signed)
R chest port accessed.GBR. Flushes easily. Please note an existing raised round area to the right of the port  area. RN made aware.

## 2021-09-27 NOTE — Transfer of Care (Signed)
Immediate Anesthesia Transfer of Care Note  Patient: Brenda [name]  Procedure(s) Performed: CYSTOSCOPY WITH RETROGRADE PYELOGRAM/URETERAL STENT PLACEMENT (Right: Ureter)  Patient Location: PACU  Anesthesia Type:General  Level of Consciousness: awake and patient cooperative  Airway & Oxygen Therapy: Patient Spontanous Breathing and Patient connected to face mask  Post-op Assessment: Report given to RN and Post -op Vital signs reviewed and stable  Post vital signs: Reviewed and stable  Last Vitals:  Vitals Value Taken Time  BP 122/76 09/27/21 1602  Temp    Pulse 95 09/27/21 1603  Resp 20 09/27/21 1603  SpO2 100 % 09/27/21 1603  Vitals shown include unvalidated device data.  Last Pain:  Vitals:   09/27/21 1259  TempSrc: Oral  PainSc:       Patients Stated Pain Goal: 2 (49/96/92 [id number])  Complications: No notable events documented.

## 2021-09-27 NOTE — Anesthesia Procedure Notes (Signed)
Procedure Name: LMA Insertion Date/Time: 09/27/2021 3:30 PM  Performed by: Claudia Desanctis, CRNAPre-anesthesia Checklist: Emergency Drugs available, Patient identified, Suction available and Patient being monitored Patient Re-evaluated:Patient Re-evaluated prior to induction Oxygen Delivery Method: Circle system utilized Preoxygenation: Pre-oxygenation with 100% oxygen Induction Type: IV induction Ventilation: Mask ventilation without difficulty LMA: LMA inserted LMA Size: 4.0 Number of attempts: 1 Placement Confirmation: positive ETCO2 and breath sounds checked- equal and bilateral Tube secured with: Tape Dental Injury: Teeth and Oropharynx as per pre-operative assessment

## 2021-09-27 NOTE — Anesthesia Preprocedure Evaluation (Addendum)
Anesthesia Evaluation  Patient identified by MRN, date of birth, ID band Patient awake    Reviewed: Allergy & Precautions, NPO status , Patient's Chart, lab work & pertinent test results  History of Anesthesia Complications (+) history of anesthetic complications  Airway Mallampati: III  TM Distance: >3 FB Neck ROM: Full    Dental  (+) Poor Dentition, Chipped, Dental Advisory Given, Missing,    Pulmonary shortness of breath, former smoker,  Met Lung CA   + rhonchi        Cardiovascular hypertension, Pt. on medications and Pt. on home beta blockers +CHF   Rhythm:Regular Rate:Tachycardia  1. Left ventricular ejection fraction, by estimation, is 40 to 45%. The  left ventricle has mildly decreased function. The left ventricle  demonstrates global hypokinesis. Left ventricular diastolic parameters are  consistent with Grade I diastolic  dysfunction (impaired relaxation).  2. Right ventricular systolic function is normal. The right ventricular  size is normal.  3. Large pleural effusion in the left lateral region.  4. The mitral valve is normal in structure. No evidence of mitral valve  regurgitation. No evidence of mitral stenosis.  5. The aortic valve is normal in structure. Aortic valve regurgitation is  not visualized. No aortic stenosis is present.  6. The inferior vena cava is normal in size with greater than 50%  respiratory variability, suggesting right atrial pressure of 3 mmHg.    Neuro/Psych PSYCHIATRIC DISORDERS Depression negative neurological ROS     GI/Hepatic negative GI ROS, Neg liver ROS,   Endo/Other  Lab Results      Component                Value               Date                      HGBA1C                   <4.2 (L)            07/01/2020             Renal/GU ARF and CRFRenal diseaseLab Results      Component                Value               Date                      CREATININE                4.07 (H)            09/27/2021           Lab Results      Component                Value               Date                      K                        5.2 (H)             09/27/2021                Musculoskeletal   Abdominal   Peds  Hematology  (+) Blood  dyscrasia, anemia , Lab Results      Component                Value               Date                      WBC                      4.4                 09/26/2021                HGB                      7.8 (L)             09/26/2021                HCT                      25.2 (L)            09/26/2021                MCV                      102.0 (H)           09/26/2021                PLT                      235                 09/26/2021              Anesthesia Other Findings   Reproductive/Obstetrics                            Anesthesia Physical Anesthesia Plan  ASA: 5  Anesthesia Plan: General   Post-op Pain Management:    Induction: Intravenous  PONV Risk Score and Plan: 3 and Propofol infusion, TIVA and Ondansetron  Airway Management Planned: LMA  Additional Equipment: None  Intra-op Plan:   Post-operative Plan: Extubation in OR  Informed Consent: I have reviewed the patients History and Physical, chart, labs and discussed the procedure including the risks, benefits and alternatives for the proposed anesthesia with the patient or authorized representative who has indicated his/her understanding and acceptance.     Dental advisory given  Plan Discussed with: CRNA  Anesthesia Plan Comments:       Anesthesia Quick Evaluation

## 2021-09-27 NOTE — Anesthesia Postprocedure Evaluation (Signed)
Anesthesia Post Note  Patient: Brenda [name]  Procedure(s) Performed: CYSTOSCOPY WITH RETROGRADE PYELOGRAM/URETERAL STENT PLACEMENT (Right: Ureter)     Patient location during evaluation: PACU Anesthesia Type: General Level of consciousness: patient cooperative and awake Pain management: pain level controlled Vital Signs Assessment: post-procedure vital signs reviewed and stable Respiratory status: spontaneous breathing, nonlabored ventilation, respiratory function stable and patient connected to nasal cannula oxygen Cardiovascular status: blood pressure returned to baseline and stable Postop Assessment: no apparent nausea or vomiting Anesthetic complications: no   No notable events documented.  Last Vitals:  Vitals:   09/27/21 1630 09/27/21 1647  BP: (!) 143/92 (!) 144/80  Pulse: 95 93  Resp: 19 14  Temp:  36.4 C  SpO2: 96% 94%    Last Pain:  Vitals:   09/27/21 1647  TempSrc: Oral  PainSc:                  [name]

## 2021-09-27 NOTE — Op Note (Signed)
Preoperative diagnosis:  Right ureteral obstruction Metastatic lung cancer  Postoperative diagnosis:  Right ureteral obstruction Metastatic lung cancer  Procedure:  Cystoscopy Right ureteral stent placement (6 x 24-no string)  Right retrograde pyelography with interpretation   Surgeon: Pryor Curia. M.D.  Anesthesia: General  Complications: None  Intraoperative findings: Right retrograde pyelography was performed with a 6 French ureteral catheter and Omnipaque contrast.  This demonstrated mild dilation of the proximal ureter without a distinct transition point.  There was mild tortuosity of the right ureter.  Mild right hydronephrosis.  No intrinsic filling defects noted.  EBL: Minimal  Specimens: None  Indication: Brenda [name] is a 61 y.o. patient with metastatic lung cancer and right ureteral obstruction secondary to metastatic disease.. After reviewing the management options for treatment, he elected to proceed with the above surgical procedure(s). We have discussed the potential benefits and risks of the procedure, side effects of the proposed treatment, the likelihood of the patient achieving the goals of the procedure, and any potential problems that might occur during the procedure or recuperation. Informed consent has been obtained.  Description of procedure:  The patient was taken to the operating room and general anesthesia was induced.  The patient was placed in the dorsal lithotomy position, prepped and draped in the usual sterile fashion, and preoperative antibiotics were administered. A preoperative time-out was performed.   Cystourethroscopy was performed.  The patient's urethra was examined and was normal. The bladder was then systematically examined in its entirety. There was no evidence for any bladder tumors, stones, or other mucosal pathology.    Attention then turned to the right ureteral orifice and a ureteral catheter was used to intubate the  ureteral orifice.  Omnipaque contrast was injected through the ureteral catheter and a retrograde pyelogram was performed with findings as dictated above.  A 0.38 sensor guidewire was then advanced up the right ureter into the renal pelvis under fluoroscopic guidance.  The wire was then backloaded through the cystoscope and a ureteral stent was advance over the wire using Seldinger technique.  The stent was positioned appropriately under fluoroscopic and cystoscopic guidance.  The wire was then removed with an adequate stent curl noted in the renal pelvis as well as in the bladder.  The bladder was then emptied and the procedure ended.  The patient appeared to tolerate the procedure well and without complications.  The patient was able to be awakened and transferred to the recovery unit in satisfactory condition.    Pryor Curia MD

## 2021-09-27 NOTE — Progress Notes (Signed)
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 Anemia of chronic disease/renal disease Prior alcohol abuse Adult failure to thrive   Niece Melody Haver is HCPOA  Brought over from skilled facility with facial + neck swelling X 24 hours-on further evaluation noted tender lymph node left side of neck Work-up revealed relatively normal vitals but BUNs/creatinine up to 50/4.5 over baseline of 30/2.6 LFTs normal Hemoglobin 8.4 platelet 264 MCV 1 2 WBC 5.2 CT abdomen pelvis = metastatic disease hydronephrosis obstruction from mass lesion along right renal pelvis 3.2 X3.1 interval development of bilateral renal masses and right adrenal metastases and peritoneal implants lesions and multiple subcutaneous soft tissue mets CT head metastatic lesion parietal frontal lobes decreased in size Transfacial left facial mass abutting inferior aspect of masseter?  Metastatic disease 2 cm lymph node 17 mm right upper lobe nodule    Hospital-Problem based course  Stage IV lung cancer with brain mets and now overall facial as well as abdominal metastases Oncology has seen the patient patient is not a candidate for further therapies They recommend hospice Continuing at this time Keppra 500 twice daily Hold gabapentin secondary to risk of encephalopathy with renal insufficiency Metabolic encephalopathy overnight Monitor trends-if further confusion may consider repeat CT of brain-CT head showed decrease in size of metastatic lesions on admission May use Ativan for agitation Discontinued oxycodone, opiates given her renal  function and risk of encephalopathy AKI likely secondary to mass effect from metastatic disease of lung tumor CKD 3B at baseline with baseline creatinine 2.5 Continues on V fluid 75 cc/H-only marginal improvement Watch potassium-if further worsening need to start [place] discussion with niece as well as urology-probably going for retrograde stent placement to see if this helps with renal insufficiency HFrEF HFpEF Continue Coreg 3.125 twice daily Microcytic anemia Component probably related to malignancy   DVT prophylaxis: scd Code Status: full Family Communication: called niece Melody Haver [phone] engaged-currently awaiting palliative care to discuss goals of care with family-she is a full code after our discussions previously when she was more coherent Disposition:  Status is: Observation The patient will require care spanning > 2 midnights and should be moved to inpatient because:   Does need discussion of goals of care Will need cystoscopy as well as retrograde stent placement 7/8 per urology  Probable return to facility in the next 48 hours   Consultants:  Oncology  Procedures:   Antimicrobials:     Subjective:  Events overnight noted-quite agitated and confused was trying to get out of bed and was given Ativan I am not able to get a meaningful history from her this morning-she is confabulating   Objective: Vitals:   09/26/21 0413 09/26/21 1000 09/26/21 1143 09/27/21 0608  BP: 126/69 109/78 131/76 (!) 150/97  Pulse: 91 91 91 (!) 102  Resp: 18  17 18   Temp: 98.8 F (37.1 C)  98.6 F (37 C) 97.7 F (36.5 C)  TempSrc: Oral  Oral Axillary  SpO2: 95%  97% 98%  Weight:      Height:        Intake/Output Summary (Last 24 hours) at 09/27/2021 0850 Last data filed at 09/27/2021  1314 Gross per 24 hour  Intake 2619.48 ml  Output --  Net 2619.48 ml    Filed Weights   09/24/21 2030  Weight: 46 kg    Examination:  Confused black female  laying in bed She is not making much sense when I talk with her S1-S2 no murmur Abdomen has the prior masses noted She has only trace edema   Data Reviewed: personally reviewed   CBC    Component Value Date/Time   WBC 4.4 09/26/2021 0604   RBC 2.47 (L) 09/26/2021 0604   HGB 7.8 (L) 09/26/2021 0604   HGB 9.4 (L) 06/18/2020 1126   HCT 25.2 (L) 09/26/2021 0604   PLT 235 09/26/2021 0604   PLT 248 06/18/2020 1126   MCV 102.0 (H) 09/26/2021 0604   MCH 31.6 09/26/2021 0604   MCHC 31.0 09/26/2021 0604   RDW 14.0 09/26/2021 0604   LYMPHSABS 1.6 09/26/2021 0604   MONOABS 0.6 09/26/2021 0604   EOSABS 0.1 09/26/2021 0604   BASOSABS 0.0 09/26/2021 0604      Latest Ref Rng & Units 09/27/2021    1:17 AM 09/26/2021    6:04 AM 09/25/2021    9:55 AM  CMP  Glucose 70 - 99 mg/dL 109  88  139   BUN 6 - 20 mg/dL 54  52  44   Creatinine 0.44 - 1.00 mg/dL 4.07  4.24  4.50   Sodium 135 - 145 mmol/L 140  138  141   Potassium 3.5 - 5.1 mmol/L 5.2  5.1  4.5   Chloride 98 - 111 mmol/L 116  112  109   CO2 22 - 32 mmol/L 18  19  22    Calcium 8.9 - 10.3 mg/dL 8.4  8.8  8.8   Total Protein 6.5 - 8.1 g/dL  6.4  7.0   Total Bilirubin 0.3 - 1.2 mg/dL  0.5  0.4   Alkaline Phos 38 - 126 U/L  74  81   AST 15 - 41 U/L  18  20   ALT 0 - 44 U/L  8  8      Radiology Studies: No results found.   Scheduled Meds:  carvedilol  3.125 mg Oral BID   Chlorhexidine Gluconate Cloth  6 each Topical Q0600   docusate sodium  100 mg Oral BID   feeding supplement  237 mL Oral TID BM   ferrous sulfate  325 mg Oral Q breakfast   folic acid  1 mg Oral Daily   levETIRAcetam  500 mg Oral BID   mupirocin ointment  1 Application Nasal BID   pantoprazole  40 mg Oral Daily   senna  1 tablet Oral BID   sodium chloride flush  10-40 mL Intracatheter Q12H   thiamine  100 mg Oral Daily   Continuous Infusions:  sodium chloride 75 mL/hr at 09/26/21 1603    ceFAZolin (ANCEF) IV       LOS: 2 days   Time spent:  [place], MD [facility] To contact the attending provider between 7A-7P or the covering provider during after hours 7P-7A, please log into the web site [email or web address] and access using universal [place] password for that web site. If you do not have the password, please call the hospital operator.  09/27/2021, 8:50 AM

## 2021-09-28 DIAGNOSIS — Z515 Encounter for palliative care: Secondary | ICD-10-CM | POA: Diagnosis not present

## 2021-09-28 DIAGNOSIS — N179 Acute kidney failure, unspecified: Secondary | ICD-10-CM | POA: Diagnosis not present

## 2021-09-28 LAB — BASIC METABOLIC PANEL
Anion gap: 7 (ref 5–15)
BUN: 50 mg/dL — ABNORMAL HIGH (ref 6–20)
CO2: 16 mmol/L — ABNORMAL LOW (ref 22–32)
Calcium: 8.9 mg/dL (ref 8.9–10.3)
Chloride: 119 mmol/L — ABNORMAL HIGH (ref 98–111)
Creatinine, Ser: 3.93 mg/dL — ABNORMAL HIGH (ref 0.44–1.00)
GFR, Estimated: 13 mL/min — ABNORMAL LOW (ref >60)
Glucose, Bld: 115 mg/dL — ABNORMAL HIGH (ref 70–99)
Potassium: 5.4 mmol/L — ABNORMAL HIGH (ref 3.5–5.1)
Sodium: 142 mmol/L (ref 135–145)

## 2021-09-28 LAB — CBC
HCT: 25.8 % — ABNORMAL LOW (ref 36.0–46.0)
Hemoglobin: 8 g/dL — ABNORMAL LOW (ref 12.0–15.0)
MCH: 32 pg (ref 26.0–34.0)
MCHC: 31 g/dL (ref 30.0–36.0)
MCV: 103.2 fL — ABNORMAL HIGH (ref 80.0–100.0)
Platelets: 253 10*3/uL (ref 150–400)
RBC: 2.5 MIL/uL — ABNORMAL LOW (ref 3.87–5.11)
RDW: 14 % (ref 11.5–15.5)
WBC: 5 10*3/uL (ref 4.0–10.5)
nRBC: 0 % (ref 0.0–0.2)

## 2021-09-28 MED ORDER — FUROSEMIDE 20 MG PO TABS
20.0000 mg | ORAL_TABLET | Freq: Every day | ORAL | Status: AC
  Administered 2021-09-28 – 2021-09-29 (×2): 20 mg via ORAL
  Filled 2021-09-28 (×2): qty 1

## 2021-09-28 MED ORDER — SODIUM ZIRCONIUM CYCLOSILICATE 10 G PO PACK
10.0000 g | PACK | Freq: Two times a day (BID) | ORAL | Status: DC
  Administered 2021-09-28 – 2021-09-29 (×3): 10 g via ORAL
  Filled 2021-09-28 (×3): qty 1

## 2021-09-28 NOTE — Progress Notes (Signed)
[facility])  Referral for hospice at facility once discharged.  Spoke with her niece and Tana Conch, she confirms interest in hospice services. No questions or concerns at this time.  [facility] will continue to follow and help with d/c plans.  Thank you, Venia Carbon DNP, RN [facility] Liaison  (Epic chat or [phone])

## 2021-09-28 NOTE — Progress Notes (Signed)
Urology Progress Note   1 Day Post-Op from right ureteral stent placement for malignant obstruction.   Subjective: NAEON. She is feeling well this AM, more oriented today. Afebrile and vital signs stable, voiding more frequently but comfortable after stent placement. Creatinine improving.  Objective: Vital signs in last 24 hours: Temp:  [97.6 F (36.4 C)-98.5 F (36.9 C)] 98.5 F (36.9 C) (07/09 0616) Pulse Rate:  [93-101] 96 (07/09 0616) Resp:  [14-28] 18 (07/09 0616) BP: (122-144)/(72-92) 125/72 (07/09 0616) SpO2:  [94 %-100 %] 100 % (07/09 0616)  Intake/Output from previous day: 07/08 0701 - 07/09 0700 In: 250 [I.V.:200; IV Piggyback:50] Out: 1250 [Urine:1250] Intake/Output this shift: No intake/output data recorded.  Physical Exam:  General: Alert and oriented CV: Regular rate Lungs: No increased work of breathing Abdomen:  Soft, appropriately tender. Incisions c/d/i. JP SS GU:  Voiding spontaneously Ext: NT, No erythema  Lab Results: Recent Labs    09/25/21 0955 09/26/21 0604 09/28/21 0307  HGB 8.8* 7.8* 8.0*  HCT 28.0* 25.2* 25.8*   Recent Labs    09/27/21 0117 09/28/21 0307  NA 140 142  K 5.2* 5.4*  CL 116* 119*  CO2 18* 16*  GLUCOSE 109* 115*  BUN 54* 50*  CREATININE 4.07* 3.93*  CALCIUM 8.4* 8.9    Studies/Results: DG C-Arm 1-60 Min-No Report  Result Date: 09/27/2021 Fluoroscopy was utilized by the requesting physician.  No radiographic interpretation.    Assessment/Plan:  61 y.o. female s/p right ureteral stent placement.  Overall doing well post-op.   - Urology will coordinate follow-up for stent exchange when needed   LOS: 3 days

## 2021-09-28 NOTE — Progress Notes (Signed)
Palliative Medicine   Name: Brenda [name] Date: 09/28/2021 MRN: [id number]  DOB: 1960-11-22  Patient Care Team: Pcp, No as PCP - General Patient, No Pcp Per (General Practice) Brenda Bears, MD as Consulting Physician (Oncology)    REASON FOR CONSULTATION: Brenda [name] is a 61 y.o. 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  Patient was admitted to hospital 09/24/2021 from SNF with facial and neck swelling.  Patient was found to have AKI secondary to mass effect from cancer progression.  She underwent ureteral stenting.  Palliative care was consulted help address goals.  CODE STATUS: DNR  PAST MEDICAL HISTORY: Past Medical History:  Diagnosis Date   Breast cancer ([place])    Depression    Mental disorder     PAST SURGICAL HISTORY:  Past Surgical History:  Procedure Laterality Date   ESOPHAGOGASTRODUODENOSCOPY (EGD) WITH PROPOFOL N/A 04/09/2020   Procedure: ESOPHAGOGASTRODUODENOSCOPY (EGD) WITH PROPOFOL;  Surgeon: Doran Stabler, MD;  Location: [facility];  Service: Gastroenterology;  Laterality: N/A;   IR IMAGING GUIDED PORT INSERTION  11/03/2019   TUBAL LIGATION     VIDEO BRONCHOSCOPY WITH ENDOBRONCHIAL NAVIGATION N/A 04/04/2019   Procedure: VIDEO BRONCHOSCOPY WITH ENDOBRONCHIAL NAVIGATION;  Surgeon: Garner Nash, DO;  Location: [place];  Service: Thoracic;  Laterality: N/A;   VIDEO BRONCHOSCOPY WITH ENDOBRONCHIAL ULTRASOUND N/A 04/04/2019   Procedure: VIDEO BRONCHOSCOPY WITH ENDOBRONCHIAL ULTRASOUND;  Surgeon: Garner Nash, DO;  Location: [place];  Service: Thoracic;  Laterality: N/A;    HEMATOLOGY/ONCOLOGY HISTORY:  Oncology History  Malignant neoplasm of right lung ([place])  09/14/2019 Initial Diagnosis   Adenocarcinoma of right lung, stage 4  ([place])   09/28/2019 - 06/18/2020 Chemotherapy   Patient is on Treatment Plan : LUNG CARBOplatin / Pemetrexed / Pembrolizumab q21d Induction x 4 cycles / Maintenance Pemetrexed + Pembrolizumab     04/17/2020 Cancer Staging   Staging form: Lung, AJCC 8th Edition - Clinical: Stage IVA Brenda [name], cN2, cM1b) - Signed by Brenda Bears, MD on 04/17/2020     ALLERGIES:  is allergic to aspirin adult low [aspirin].  MEDICATIONS:  Current Facility-Administered Medications  Medication Dose Route Frequency Provider Last Rate Last Admin   acetaminophen (TYLENOL) tablet 650 mg  650 mg Oral Q6H PRN Raynelle Bring, MD       Or   acetaminophen (TYLENOL) suppository 650 mg  650 mg Rectal Q6H PRN Raynelle Bring, MD       bisacodyl (DULCOLAX) suppository 10 mg  10 mg Rectal Daily PRN Raynelle Bring, MD       carvedilol (COREG) tablet 3.125 mg  3.125 mg Oral BID Raynelle Bring, MD   3.125 mg at 09/28/21 0847   Chlorhexidine Gluconate Cloth 2 % PADS 6 each  6 each Topical Q0600 Raynelle Bring, MD   6 each at 09/28/21 0549   docusate sodium (COLACE) capsule 100 mg  100 mg Oral BID Raynelle Bring, MD   100 mg at 09/28/21 0847   feeding supplement (ENSURE ENLIVE / ENSURE PLUS) liquid 237 mL  237 mL Oral TID BM Raynelle Bring, MD   237 mL at 09/28/21 0849   ferrous sulfate tablet 325 mg  325 mg Oral Q breakfast Raynelle Bring, MD   325 mg at 16/57/90 3833   folic acid (FOLVITE) tablet 1  mg  1 mg Oral Daily Raynelle Bring, MD   1 mg at 09/28/21 0847   levETIRAcetam (KEPPRA) 100 MG/ML solution 500 mg  500 mg Oral BID Raynelle Bring, MD   500 mg at 09/28/21 0846   LORazepam (ATIVAN) injection 0.5 mg  0.5 mg Intravenous Q6H PRN Raynelle Bring, MD   0.5 mg at 09/27/21 1346   mupirocin ointment (BACTROBAN) 2 % 1 Application  1 Application Nasal BID Raynelle Bring, MD   1 Application at 86/76/72 0846   pantoprazole (PROTONIX) EC tablet 40 mg  40 mg Oral Daily Raynelle Bring, MD   40 mg at 09/28/21 0847   polyethylene glycol  (MIRALAX / GLYCOLAX) packet 17 g  17 g Oral Daily PRN Raynelle Bring, MD       senna (SENOKOT) tablet 8.6 mg  1 tablet Oral BID Raynelle Bring, MD   8.6 mg at 09/28/21 0847   sodium chloride flush (NS) 0.9 % injection 10-40 mL  10-40 mL Intracatheter Q12H Raynelle Bring, MD       sodium chloride flush (NS) 0.9 % injection 10-40 mL  10-40 mL Intracatheter PRN Raynelle Bring, MD       sodium zirconium cyclosilicate (LOKELMA) packet 10 g  10 g Oral BID Nita Sells, MD   10 g at 09/28/21 0846   thiamine tablet 100 mg  100 mg Oral Daily Raynelle Bring, MD   100 mg at 09/28/21 0847    VITAL SIGNS: BP 125/72 (BP Location: Left Arm)   Pulse 96   Temp 98.5 F (36.9 C)   Resp 18   Ht 5' 6"  (1.676 m)   Wt 101 lb 6.6 oz (46 kg)   LMP 01/19/2011   SpO2 100%   BMI 16.37 kg/m  Filed Weights   09/24/21 2030  Weight: 101 lb 6.6 oz (46 kg)    Estimated body mass index is 16.37 kg/m as calculated from the following:   Height as of this encounter: 5' 6"  (1.676 m).   Weight as of this encounter: 101 lb 6.6 oz (46 kg).  LABS: CBC:    Component Value Date/Time   WBC 5.0 09/28/2021 0307   HGB 8.0 (L) 09/28/2021 0307   HGB 9.4 (L) 06/18/2020 1126   HCT 25.8 (L) 09/28/2021 0307   PLT 253 09/28/2021 0307   PLT 248 06/18/2020 1126   MCV 103.2 (H) 09/28/2021 0307   NEUTROABS 2.1 09/26/2021 0604   LYMPHSABS 1.6 09/26/2021 0604   MONOABS 0.6 09/26/2021 0604   EOSABS 0.1 09/26/2021 0604   BASOSABS 0.0 09/26/2021 0604   Comprehensive Metabolic Panel:    Component Value Date/Time   NA 142 09/28/2021 0307   K 5.4 (H) 09/28/2021 0307   CL 119 (H) 09/28/2021 0307   CO2 16 (L) 09/28/2021 0307   BUN 50 (H) 09/28/2021 0307   CREATININE 3.93 (H) 09/28/2021 0307   CREATININE 1.33 (H) 06/18/2020 1126   GLUCOSE 115 (H) 09/28/2021 0307   CALCIUM 8.9 09/28/2021 0307   AST 18 09/26/2021 0604   AST 22 06/18/2020 1126   ALT 8 09/26/2021 0604   ALT 10 06/18/2020 1126   ALKPHOS 74 09/26/2021 0604    BILITOT 0.5 09/26/2021 0604   BILITOT 0.3 06/18/2020 1126   PROT 6.4 (L) 09/26/2021 0604   ALBUMIN 2.9 (L) 09/26/2021 0604    RADIOGRAPHIC STUDIES: DG C-Arm 1-60 Min-No Report  Result Date: 09/27/2021 Fluoroscopy was utilized by the requesting physician.  No radiographic interpretation.   CT Soft Tissue  Neck Wo Contrast  Result Date: 09/24/2021 CLINICAL DATA:  Left neck swelling.  History of lung carcinoma. EXAM: CT NECK WITHOUT CONTRAST TECHNIQUE: Multidetector CT imaging of the neck was performed following the standard protocol without intravenous contrast. RADIATION DOSE REDUCTION: This exam was performed according to the departmental dose-optimization program which includes automated exposure control, adjustment of the mA and/or kV according to patient size and/or use of iterative reconstruction technique. COMPARISON:  None Available. FINDINGS: Pharynx and larynx: Normal. No mass or swelling. Salivary glands: No inflammation, mass, or stone. Thyroid: Normal. Lymph nodes: There is a trans spatial left facial mass that abuts the inferior aspect of the left masseter muscle and measures 2.5 x 2.2 cm. There is a 2.2 cm left level 5A lymph node (image 55). Vascular: Unremarkable unenhanced appearance Limited intracranial: Negative. Visualized orbits: Negative. Mastoids and visualized paranasal sinuses: Bilateral maxillary sinus mucosal thickening. Small mastoid effusions. Skeleton: No acute or aggressive process. Upper chest: 17 mm nodule in the right upper lobe, more completely evaluated on concomitant chest CT. Other: None IMPRESSION: 1. Transspatial left facial mass that abuts the inferior aspect of the left masseter muscle, most consistent with metastatic disease. 2. A 2.2 cm left level 5A lymph node. 3. A 17 mm nodule in the right upper lobe, more completely evaluated on concomitant chest CT. Electronically Signed   By: Ulyses Jarred M.D.   On: 09/24/2021 21:31   CT CHEST ABDOMEN PELVIS WO  CONTRAST  Result Date: 09/24/2021 CLINICAL DATA:  Metastatic disease evaluation known lung cancer has renal failure. Noticed lump on abdomen and upper chest r/o mass EXAM: CT CHEST, ABDOMEN AND PELVIS WITHOUT CONTRAST TECHNIQUE: Multidetector CT imaging of the chest, abdomen and pelvis was performed following the standard protocol without IV contrast. RADIATION DOSE REDUCTION: This exam was performed according to the departmental dose-optimization program which includes automated exposure control, adjustment of the mA and/or kV according to patient size and/or use of iterative reconstruction technique. COMPARISON:  CT chest, abdomen, pelvis 03/11/2021 FINDINGS: CT CHEST FINDINGS Right chest wall Port-A-Cath with tip terminating in the right atrium. Cardiovascular: Normal heart size. No significant pericardial effusion. The thoracic aorta is normal in caliber. No atherosclerotic plaque of the thoracic aorta. At least 2 vessel coronary artery calcifications. Mediastinum/Nodes: No gross hilar adenopathy, noting limited sensitivity for the detection of hilar adenopathy on this noncontrast study. No enlarged mediastinal or axillary lymph nodes. Thyroid gland, trachea, and esophagus demonstrate no significant findings. Lungs/Pleura: No focal consolidation. Interval increase in size of a 3.8 x 4.2 cm right upper lobe mass with interval increase in size of a satellite nodule adjacent to it measuring 1.7 cm. Slight interval increase in size of a subpleural spiculated right lower lobe nodule measuring 1.5 x 1.5 cm (2:15). No right pleural effusion. Likely loculated at least moderate volume left pleural effusion. Query underlying left lower lobe mass lesion. No pneumothorax. Musculoskeletal: Multiple subcutaneus soft tissue nodules and masses with as an example a left breast 3.5 x 3.6 cm lesion (2:27). No suspicious lytic or blastic osseous lesions. No acute displaced fracture. Multilevel degenerative changes of the spine. CT  ABDOMEN PELVIS FINDINGS Hepatobiliary: No focal liver abnormality. Calcified gallstone noted within the gallbladder lumen. No gallbladder wall thickening or pericholecystic fluid. No biliary dilatation. Pancreas: No focal lesion. Normal pancreatic contour. No surrounding inflammatory changes. No main pancreatic ductal dilatation. Spleen: Normal in size without focal abnormality. Adrenals/Urinary Tract: 3.3 x 2.2 cm right adrenal mass.  No left adrenal nodularity. No nephrolithiasis.  Moderate right hydronephrosis with likely obstruction from mass lesion noted along the renal pelvis / hilum measuring 3.2 x 3.1 cm. No left hydronephrosis. Couple bilateral renal masses with as an example a 3.3 x 1.6 cm left lesion along the superior pole of the left kidney (2:58). Some of these may represent peritoneal implants adjacent to the kidneys with difficulty evaluating these on noncontrast study. No ureterolithiasis or hydroureter. The urinary bladder is unremarkable. Stomach/Bowel: Stomach is within normal limits. No evidence of bowel wall thickening or dilatation. Appendix appears normal. Vascular/Lymphatic: No abdominal aorta or iliac aneurysm. Moderate atherosclerotic plaque of the aorta and its branches. No abdominal, pelvic, or inguinal lymphadenopathy. Reproductive: Uterus and bilateral adnexa are unremarkable. Other: Right upper quadrant peritoneal implant measuring 2.3 x 2 cm (2:53). No intraperitoneal free fluid. No intraperitoneal free gas. No organized fluid collection. Musculoskeletal: Multiple subcutaneus soft tissue nodules and masses with as an example a anterior abdominal wall 4.3 x 3.1 cm (2:73). Ventral hernia containing fat with an abdominal defect of the defect of 2.4 cm and a subjacent hernia with an abdominal defect of 3.2 cm. No suspicious lytic or blastic osseous lesions. No acute displaced fracture. Multilevel degenerative changes of the spine. IMPRESSION: 1. Findings consistent with worsening  metastatic disease. Markedly limited evaluation on this noncontrast study. 2. Moderate right hydronephrosis with likely obstruction from mass lesion noted along the renal pelvis/hilum measuring 3.2 x 3.1 cm. 3. Interval increase in size of the right upper lobe dominant mass, adjacent right upper lobe satellite nodule, and known right lower lobe subpleural nodule. 4. Similar-appearing likely loculated at least moderate volume left pleural effusion. Query underlying left lower lobe mass lesion. 5. Interval development of bilateral renal masses/metastases. 6. Interval development of right adrenal metastasis. 7. Interval development of peritoneal implant/lesions. 8. Interval development of multiple subcutaneus soft tissue metastases. 9. Cholelithiasis with no CT evidence of acute cholecystitis. 10. Fat containing ventral wall hernias. 11.  Aortic Atherosclerosis (ICD10-I70.0). Electronically Signed   By: Iven Finn M.D.   On: 09/24/2021 21:23   CT HEAD WO CONTRAST (5MM)  Result Date: 09/24/2021 CLINICAL DATA:  Brain metastasis suspected, headache. History of lung cancer. EXAM: CT HEAD WITHOUT CONTRAST TECHNIQUE: Contiguous axial images were obtained from the base of the skull through the vertex without intravenous contrast. RADIATION DOSE REDUCTION: This exam was performed according to the departmental dose-optimization program which includes automated exposure control, adjustment of the mA and/or kV according to patient size and/or use of iterative reconstruction technique. COMPARISON:  04/17/2021. FINDINGS: Brain: Diffuse atrophy is noted. Periventricular white matter hypodensities are noted bilaterally. No hydrocephalus. There is redemonstration of a cystic lesion in the parietal lobe on the right measuring 3.2 x 2.2 cm with associated mass effect, decreased in size from the prior exam. A 1.1 cm lesion is noted in the frontal lobe anteriorly with no significant mass effect and smaller as compared with the  prior exam. Both lesions have a small thin hyperdense rim inferiorly in the possibility of blood products can not be excluded. No definite new lesions are seen. Evaluation for metastatic disease is limited due to lack of IV contrast. No midline shift. Vascular: Atherosclerotic calcification of the carotid siphons. No hyperdense vessel. Skull: Normal. Negative for fracture or focal lesion. Sinuses/Orbits: There is partial opacification of the right maxillary sinus and left sphenoid sinus. The orbits are within normal limits. Other: There is partial opacification of the mastoid air cells bilaterally. IMPRESSION: 1. Known cystic metastatic lesions in the parietal  and frontal lobes on the right are decreased in size from the prior exam with reduced mass effect. A thin hyperdense rim is noted in both lesions inferiorly in the possibility of blood products can not be completely excluded. MRI is recommended for further evaluation. 2. Atrophy with chronic microvascular ischemic changes. Electronically Signed   By: Brett Fairy M.D.   On: 09/24/2021 21:20    PERFORMANCE STATUS (ECOG) : 3 - Symptomatic, >50% confined to bed  Review of Systems Unless otherwise noted, a complete review of systems is negative.  Physical Exam General: NAD Pulmonary: Unlabored Extremities: no edema, no joint deformities Skin: no rashes Neurological: Weakness but otherwise nonfocal  IMPRESSION: I met with patient and son to discuss goals.  Reportedly, patient has had some intermittent confusion but was alert and oriented today.  Patient states that she knows that her cancer has progressed and that her plan is to return to SNF to "live out my days."  Patient has been evaluated by medical oncology who has recommended hospice.  Patient and son verbalized agreement with hospice following her at the facility.  We discussed CODE STATUS.  Patient confirmed that she has previously verbalized a desire to remain a full code.  We do  lengthy conversation regarding the futility of resuscitative efforts in the setting of terminal and untreatable cancer.  Patient states clearly and confirmed repeatedly that she has changed her mind and would just want to be "left in peace" and not have an attempt at resuscitation at end-of-life.  Her son verbalized agreement with DNR/DNI.  I also called her niece, [name], who also verbalized agreement with DNR/DNI.  Of note, [name] is a Designer, jewellery.  PLAN: -Best supportive care -Dispo: SNF with hospice -DNR/DNI  Case and plan discussed with Dr. Verlon Au  Time Total: 35 minutes  Visit consisted of counseling and education dealing with the complex and emotionally intense issues of symptom management and palliative care in the setting of serious and potentially life-threatening illness.Greater than 50%  of this time was spent counseling and coordinating care related to the above assessment and plan.  Signed by: Altha Harm, PhD, NP-C

## 2021-09-28 NOTE — TOC Progression Note (Addendum)
Transition of Care [facility]) - Progression Note    Patient Details  Name: Niomi Valent MRN: [id number] Date of Birth: [birth year]  Transition [facility]) CM/SW Contact  Ross Ludwig, [place] Phone Number: 09/28/2021, 11:12 AM  Clinical Narrative:    CSW was informed that patient wants hospice to follow at [facility]) where she is a LTC resident.    Patient was referred to [facility], they will review patient's information.  CSW to continue to follow patient's progress throughout discharge planning.   Expected Discharge Plan: [place] Barriers to Discharge: Continued Medical Work up  Expected Discharge Plan and Services Expected Discharge Plan: [place] In-house Referral: Hospice / Palliative Care Discharge Planning Services: NA Post Acute Care Choice: [place] Living arrangements for the past 2 months: [place]                 DME Arranged: N/A DME Agency: NA       HH Arranged: NA HH Agency: NA         Social Determinants of Health (SDOH) Interventions    Readmission Risk Interventions    03/14/2021   11:37 AM 07/17/2020    2:12 PM 04/11/2020   12:16 PM  Readmission Risk Prevention Plan  Transportation Screening Complete Complete Complete  PCP or Specialist Appt within 3-5 Days   Complete  HRI or [place]  Complete Complete  Social Work Consult for [place] Planning/Counseling  Complete Complete  Palliative Care Screening  Not Applicable Not Applicable  Medication Review Press photographer) Complete Complete Complete  PCP or Specialist appointment within 3-5 days of discharge Complete    HRI or [place] Complete    SW Recovery Care/Counseling Consult Complete    Palliative Care Screening Complete    [place] Patient Refused

## 2021-09-28 NOTE — Progress Notes (Signed)
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 Anemia of chronic disease/renal disease Prior alcohol abuse Adult failure to thrive   Niece Brenda [name] is HCPOA  Brought over from skilled facility with facial + neck swelling X 24 hours-on further evaluation noted tender lymph node left side of neck Work-up revealed relatively normal vitals but BUNs/creatinine up to 50/4.5 over baseline of 30/2.6 LFTs normal Hemoglobin 8.4 platelet 264 MCV 1 2 WBC 5.2 CT abdomen pelvis = metastatic disease hydronephrosis obstruction from mass lesion along right renal pelvis 3.2 X3.1 interval development of bilateral renal masses and right adrenal metastases and peritoneal implants lesions and multiple subcutaneous soft tissue mets CT head metastatic lesion parietal frontal lobes decreased in size Transfacial left facial mass abutting inferior aspect of masseter?  Metastatic disease 2 cm lymph node 17 mm right upper lobe nodule    Hospital-Problem based course  Stage IV lung cancer with brain mets and now overall facial as well as abdominal metastases Oncology has seen the patient patient is not a candidate for further therapies They recommend hospice--pallaitve care has seen and famoly/patient agreeable to this--she is now DNR Continuing at this time Keppra 500 twice daily Hold gabapentin secondary to risk of encephalopathy with renal insufficiency Metabolic encephalopathy overnight Unclear etiology--patient back at [facility] today--no further work up unless recurrent confusion Discontinued oxycodone, opiates given  her renal function and risk of encephalopathy AKI likely secondary to mass effect from metastatic disease of lung tumor--s/p Retrograde stent placement 09/27/21 Dr. Alinda Money Urology CKD 3B at baseline with baseline creatinine 2.5 Stop IVF--give lasix 20 mg x 1 today Kidney function marginally improved This was temporizing measure--family understands this--will need OP f/u Urology in 8 weeks to conisder stent exchange if still doing fair HFrEF HFpEF Continue Coreg 3.125 twice daily Microcytic anemia Component probably related to malignancy   DVT prophylaxis: scd Code Status: DNR Family Communication: called niece Brenda [name] [phone] engaged-stabilizing Met with sone at [facility]--opportunity to ask quesitons given Disposition:  Status is: Observation The patient will require care spanning > 2 midnights and should be moved to inpatient because:   Does need discussion of goals of care Will need cystoscopy as well as retrograde stent placement 7/8 per urology  Probable return to facility in the next 48 hours   Consultants:  Oncology  Procedures:   Antimicrobials:     Subjective:  More coherent in nad Sitting in chair and engaging pleasant Nursing reports some wheezing but not requiring oxygen   Objective: Vitals:   09/27/21 1629 09/27/21 1630 09/27/21 1647 09/28/21 0616  BP: (!) 143/92 (!) 143/92 (!) 144/80 125/72  Pulse: 98 95 93 96  Resp: (!) 23 19 14 18   Temp: 97.6 F (36.4 C)  97.6 F (36.4 C) 98.5 F (36.9 C)  TempSrc:   Oral   SpO2: 97% 96% 94% 100%  Weight:      Height:        Intake/Output Summary (Last 24 hours) at 09/28/2021 1056 Last data filed at 09/28/2021 8828 Gross per 24 hour  Intake 250 ml  Output 700 ml  Net -450 ml    Filed Weights   09/24/21  2030  Weight: 46 kg    Examination:  Awake more coherent in nad no focal deficit Chest has some posterolat wheeze No rales no rhonchi Abd obese nt nd no rebound--some  subcutaneous masses Neuro intact no focal deficit  Data Reviewed: personally reviewed   CBC    Component Value Date/Time   WBC 5.0 09/28/2021 0307   RBC 2.50 (L) 09/28/2021 0307   HGB 8.0 (L) 09/28/2021 0307   HGB 9.4 (L) 06/18/2020 1126   HCT 25.8 (L) 09/28/2021 0307   PLT 253 09/28/2021 0307   PLT 248 06/18/2020 1126   MCV 103.2 (H) 09/28/2021 0307   MCH 32.0 09/28/2021 0307   MCHC 31.0 09/28/2021 0307   RDW 14.0 09/28/2021 0307   LYMPHSABS 1.6 09/26/2021 0604   MONOABS 0.6 09/26/2021 0604   EOSABS 0.1 09/26/2021 0604   BASOSABS 0.0 09/26/2021 0604      Latest Ref Rng & Units 09/28/2021    3:07 AM 09/27/2021    1:17 AM 09/26/2021    6:04 AM  CMP  Glucose 70 - 99 mg/dL 115  109  88   BUN 6 - 20 mg/dL 50  54  52   Creatinine 0.44 - 1.00 mg/dL 3.93  4.07  4.24   Sodium 135 - 145 mmol/L 142  140  138   Potassium 3.5 - 5.1 mmol/L 5.4  5.2  5.1   Chloride 98 - 111 mmol/L 119  116  112   CO2 22 - 32 mmol/L 16  18  19    Calcium 8.9 - 10.3 mg/dL 8.9  8.4  8.8   Total Protein 6.5 - 8.1 g/dL   6.4   Total Bilirubin 0.3 - 1.2 mg/dL   0.5   Alkaline Phos 38 - 126 U/L   74   AST 15 - 41 U/L   18   ALT 0 - 44 U/L   8      Radiology Studies: DG C-Arm 1-60 Min-No Report  Result Date: 09/27/2021 Fluoroscopy was utilized by the requesting physician.  No radiographic interpretation.     Scheduled Meds:  carvedilol  3.125 mg Oral BID   Chlorhexidine Gluconate Cloth  6 each Topical Q0600   docusate sodium  100 mg Oral BID   feeding supplement  237 mL Oral TID BM   ferrous sulfate  325 mg Oral Q breakfast   folic acid  1 mg Oral Daily   levETIRAcetam  500 mg Oral BID   mupirocin ointment  1 Application Nasal BID   pantoprazole  40 mg Oral Daily   senna  1 tablet Oral BID   sodium chloride flush  10-40 mL Intracatheter Q12H   sodium zirconium cyclosilicate  10 g Oral BID   thiamine  100 mg Oral Daily   Continuous Infusions:     LOS: 3 days   Time spent: 24  Nita Sells, MD [facility] To contact the attending provider between 7A-7P or the covering provider during after hours 7P-7A, please log into the web site [email or web address] and access using universal [place] password for that web site. If you do not have the password, please call the hospital operator.  09/28/2021, 10:56 AM

## 2021-09-28 NOTE — Progress Notes (Signed)
[facility])   Referral received from PMT provider/Joshua B. Contact made to niece/Amanda & son/John to confirm interest in [facility] services at [facility]. No answer from either party.  VM left providing contact information for call to be returned. [facility] staff to continue to make attempts to confirm interest.   Until consent provided, patient is not under review for [facility] services.  Daphene Calamity, MSW [facility] Liaison [phone]

## 2021-09-29 DIAGNOSIS — N179 Acute kidney failure, unspecified: Secondary | ICD-10-CM | POA: Diagnosis not present

## 2021-09-29 LAB — BASIC METABOLIC PANEL WITH GFR
Anion gap: 7 (ref 5–15)
BUN: 51 mg/dL — ABNORMAL HIGH (ref 6–20)
CO2: 17 mmol/L — ABNORMAL LOW (ref 22–32)
Calcium: 8.9 mg/dL (ref 8.9–10.3)
Chloride: 116 mmol/L — ABNORMAL HIGH (ref 98–111)
Creatinine, Ser: 3.61 mg/dL — ABNORMAL HIGH (ref 0.44–1.00)
GFR, Estimated: 14 mL/min — ABNORMAL LOW
Glucose, Bld: 92 mg/dL (ref 70–99)
Potassium: 4.7 mmol/L (ref 3.5–5.1)
Sodium: 140 mmol/L (ref 135–145)

## 2021-09-29 MED ORDER — HEPARIN SOD (PORK) LOCK FLUSH 100 UNIT/ML IV SOLN
500.0000 [IU] | INTRAVENOUS | Status: AC | PRN
  Administered 2021-09-29: 500 [IU]

## 2021-09-29 MED ORDER — LEVETIRACETAM 100 MG/ML PO SOLN: 500.0000 mg | Freq: Two times a day (BID) | ORAL | 2 refills | Status: AC

## 2021-09-29 MED ORDER — LORAZEPAM 0.5 MG PO TABS: 0.5000 mg | ORAL_TABLET | Freq: Four times a day (QID) | ORAL | 0 refills | Status: AC | PRN

## 2021-09-29 NOTE — Progress Notes (Signed)
Patient's discharge instructions have been placed in discharge packet. Report has been called to nurse at [facility]. Receiving nurse reported no further questions at this time. Port has been de-accessed by [employer]. Patient awaiting transport.  Layla Maw, RN

## 2021-09-29 NOTE — Discharge Summary (Signed)
Physician Discharge Summary  Brenda [name] GYJ:[id number] DOB: 1961/01/19 DOA: 09/24/2021  PCP: Pcp, No  Admit date: 09/24/2021 Discharge date: 09/29/2021  Time spent: 44 minutes  Recommendations for Outpatient Follow-up:  Recommend hospice to follow at facility Would use only meds that are absolutely necessary and in keeping with comfort trajectory-discontinued all others  Discharge Diagnoses:  MAIN problem for hospitalization   Acute renal failure in the setting of spreading lung cancer  Please see below for itemized issues addressed in [place]- refer to other progress notes for clarity if needed  Discharge Condition: Guarded  Diet recommendation: Comfort related  Filed Weights   09/24/21 2030  Weight: 46 kg    History of present illness:  61 year old black female [name] 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 Anemia of chronic disease/renal disease Prior alcohol abuse Adult failure to thrive     Niece Melody Haver is HCPOA   Brought over from skilled facility with facial + neck swelling X 24 hours-on further evaluation noted tender lymph node left side of neck Work-up revealed relatively normal vitals but BUNs/creatinine up to 50/4.5 over baseline of 30/2.6 LFTs normal Hemoglobin 8.4 platelet 264 MCV 1 2 WBC 5.2 CT of abdomen pelvis showed metastatic hydronephrosis 3.2 X3.1 interval development of renal masses causing obstruction  Hospital Course:  Stage IV lung cancer with brain mets and abdominal mets Patient was discussed with oncology and patient was not a candidate for further therapies as have progressed She is DNR-palliative care saw the patient in consult and recommended hospice We placed her on Keppra and  Ativan and discontinued the majority of her meds AKI on admission likely secondary to malignant hydronephrosis from extrinsic compression Urology saw the patient to perform retrograde stent placement 09/27/2021 Baseline creatinine was 2.5 she had a creatinine of 4 on admission-this trended downwards somewhat with IV fluid repletion to about after intervention by Dr. Alinda Money 3.6-it is not expected that this will completely normalize She may follow-up with Dr. Alinda Money in the outpatient setting in 8 weeks if thriving otherwise follow-up with hospice HFpEF, microcytic anemia, HIV on no meds--- all stable during hospital stay   Discharge Exam: Vitals:   09/28/21 2000 09/29/21 0655  BP: 136/78 (!) 142/84  Pulse: 96 99  Resp:  18  Temp: 98.3 F (36.8 C) 98.7 F (37.1 C)  SpO2: 96% 97%    Subj on day of d/c   She is awake coherent she understands that prognosis is guarded  General Exam on discharge  EOMI NCAT no focal deficit chest clear no added sound no rales no rhonchi no wheeze Abdomen multiple masses CTA B no added sound ROM intact moving 4 limbs equally  Discharge Instructions   Discharge Instructions     Diet - low sodium heart healthy   Complete by: As directed    Increase activity slowly   Complete by: As directed    No wound care   Complete by: As directed       Allergies as of 09/29/2021       Reactions   Aspirin Adult Low [aspirin] Other (See Comments)   Stomach upset        Medication List     STOP taking these medications    acetaminophen 325 MG tablet Commonly known as: TYLENOL  bisacodyl 10 MG suppository Commonly known as: DULCOLAX   carvedilol 3.125 MG tablet Commonly known as: COREG   feeding supplement Liqd   ferrous sulfate 324 MG Tbec   ferrous sulfate 325 (65 FE) MG tablet   folic acid 1 MG tablet Commonly known as: FOLVITE   gabapentin 300 MG capsule Commonly known as: NEURONTIN   multivitamin with minerals Tabs tablet    oxycodone 5 MG capsule Commonly known as: OXY-IR   oxyCODONE 5 MG/5ML solution Commonly known as: ROXICODONE   pantoprazole 40 MG tablet Commonly known as: PROTONIX   polyethylene glycol 17 g packet Commonly known as: MIRALAX / GLYCOLAX   sennosides-docusate sodium 8.6-50 MG tablet Commonly known as: SENOKOT-S   thiamine 100 MG tablet   vitamin B-12 500 MCG tablet Commonly known as: CYANOCOBALAMIN       TAKE these medications    levETIRAcetam 100 MG/ML solution Commonly known as: KEPPRA Take 5 mLs (500 mg total) by mouth 2 (two) times daily.   LORazepam 0.5 MG tablet Commonly known as: ATIVAN Take 1 tablet (0.5 mg total) by mouth every 6 (six) hours as needed for anxiety.       Allergies  Allergen Reactions   Aspirin Adult Low [Aspirin] Other (See Comments)    Stomach upset      The results of significant diagnostics from this hospitalization (including imaging, microbiology, ancillary and laboratory) are listed below for reference.    Significant Diagnostic Studies: DG C-Arm 1-60 Min-No Report  Result Date: 09/27/2021 Fluoroscopy was utilized by the requesting physician.  No radiographic interpretation.   CT Soft Tissue Neck Wo Contrast  Result Date: 09/24/2021 CLINICAL DATA:  Left neck swelling.  History of lung carcinoma. EXAM: CT NECK WITHOUT CONTRAST TECHNIQUE: Multidetector CT imaging of the neck was performed following the standard protocol without intravenous contrast. RADIATION DOSE REDUCTION: This exam was performed according to the departmental dose-optimization program which includes automated exposure control, adjustment of the mA and/or kV according to patient size and/or use of iterative reconstruction technique. COMPARISON:  None Available. FINDINGS: Pharynx and larynx: Normal. No mass or swelling. Salivary glands: No inflammation, mass, or stone. Thyroid: Normal. Lymph nodes: There is a trans spatial left facial mass that abuts the inferior aspect  of the left masseter muscle and measures 2.5 x 2.2 cm. There is a 2.2 cm left level 5A lymph node (image 55). Vascular: Unremarkable unenhanced appearance Limited intracranial: Negative. Visualized orbits: Negative. Mastoids and visualized paranasal sinuses: Bilateral maxillary sinus mucosal thickening. Small mastoid effusions. Skeleton: No acute or aggressive process. Upper chest: 17 mm nodule in the right upper lobe, more completely evaluated on concomitant chest CT. Other: None IMPRESSION: 1. Transspatial left facial mass that abuts the inferior aspect of the left masseter muscle, most consistent with metastatic disease. 2. A 2.2 cm left level 5A lymph node. 3. A 17 mm nodule in the right upper lobe, more completely evaluated on concomitant chest CT. Electronically Signed   By: Ulyses Jarred M.D.   On: 09/24/2021 21:31   CT CHEST ABDOMEN PELVIS WO CONTRAST  Result Date: 09/24/2021 CLINICAL DATA:  Metastatic disease evaluation known lung cancer has renal failure. Noticed lump on abdomen and upper chest r/o mass EXAM: CT CHEST, ABDOMEN AND PELVIS WITHOUT CONTRAST TECHNIQUE: Multidetector CT imaging of the chest, abdomen and pelvis was performed following the standard protocol without IV contrast. RADIATION DOSE REDUCTION: This exam was performed according to the departmental dose-optimization program which includes automated exposure control, adjustment of the  mA and/or kV according to patient size and/or use of iterative reconstruction technique. COMPARISON:  CT chest, abdomen, pelvis 03/11/2021 FINDINGS: CT CHEST FINDINGS Right chest wall Port-A-Cath with tip terminating in the right atrium. Cardiovascular: Normal heart size. No significant pericardial effusion. The thoracic aorta is normal in caliber. No atherosclerotic plaque of the thoracic aorta. At least 2 vessel coronary artery calcifications. Mediastinum/Nodes: No gross hilar adenopathy, noting limited sensitivity for the detection of hilar adenopathy  on this noncontrast study. No enlarged mediastinal or axillary lymph nodes. Thyroid gland, trachea, and esophagus demonstrate no significant findings. Lungs/Pleura: No focal consolidation. Interval increase in size of a 3.8 x 4.2 cm right upper lobe mass with interval increase in size of a satellite nodule adjacent to it measuring 1.7 cm. Slight interval increase in size of a subpleural spiculated right lower lobe nodule measuring 1.5 x 1.5 cm (2:15). No right pleural effusion. Likely loculated at least moderate volume left pleural effusion. Query underlying left lower lobe mass lesion. No pneumothorax. Musculoskeletal: Multiple subcutaneus soft tissue nodules and masses with as an example a left breast 3.5 x 3.6 cm lesion (2:27). No suspicious lytic or blastic osseous lesions. No acute displaced fracture. Multilevel degenerative changes of the spine. CT ABDOMEN PELVIS FINDINGS Hepatobiliary: No focal liver abnormality. Calcified gallstone noted within the gallbladder lumen. No gallbladder wall thickening or pericholecystic fluid. No biliary dilatation. Pancreas: No focal lesion. Normal pancreatic contour. No surrounding inflammatory changes. No main pancreatic ductal dilatation. Spleen: Normal in size without focal abnormality. Adrenals/Urinary Tract: 3.3 x 2.2 cm right adrenal mass.  No left adrenal nodularity. No nephrolithiasis. Moderate right hydronephrosis with likely obstruction from mass lesion noted along the renal pelvis / hilum measuring 3.2 x 3.1 cm. No left hydronephrosis. Couple bilateral renal masses with as an example a 3.3 x 1.6 cm left lesion along the superior pole of the left kidney (2:58). Some of these may represent peritoneal implants adjacent to the kidneys with difficulty evaluating these on noncontrast study. No ureterolithiasis or hydroureter. The urinary bladder is unremarkable. Stomach/Bowel: Stomach is within normal limits. No evidence of bowel wall thickening or dilatation. Appendix  appears normal. Vascular/Lymphatic: No abdominal aorta or iliac aneurysm. Moderate atherosclerotic plaque of the aorta and its branches. No abdominal, pelvic, or inguinal lymphadenopathy. Reproductive: Uterus and bilateral adnexa are unremarkable. Other: Right upper quadrant peritoneal implant measuring 2.3 x 2 cm (2:53). No intraperitoneal free fluid. No intraperitoneal free gas. No organized fluid collection. Musculoskeletal: Multiple subcutaneus soft tissue nodules and masses with as an example a anterior abdominal wall 4.3 x 3.1 cm (2:73). Ventral hernia containing fat with an abdominal defect of the defect of 2.4 cm and a subjacent hernia with an abdominal defect of 3.2 cm. No suspicious lytic or blastic osseous lesions. No acute displaced fracture. Multilevel degenerative changes of the spine. IMPRESSION: 1. Findings consistent with worsening metastatic disease. Markedly limited evaluation on this noncontrast study. 2. Moderate right hydronephrosis with likely obstruction from mass lesion noted along the renal pelvis/hilum measuring 3.2 x 3.1 cm. 3. Interval increase in size of the right upper lobe dominant mass, adjacent right upper lobe satellite nodule, and known right lower lobe subpleural nodule. 4. Similar-appearing likely loculated at least moderate volume left pleural effusion. Query underlying left lower lobe mass lesion. 5. Interval development of bilateral renal masses/metastases. 6. Interval development of right adrenal metastasis. 7. Interval development of peritoneal implant/lesions. 8. Interval development of multiple subcutaneus soft tissue metastases. 9. Cholelithiasis with no CT evidence of acute  cholecystitis. 10. Fat containing ventral wall hernias. 11.  Aortic Atherosclerosis (ICD10-I70.0). Electronically Signed   By: Iven Finn M.D.   On: 09/24/2021 21:23   CT HEAD WO CONTRAST (5MM)  Result Date: 09/24/2021 CLINICAL DATA:  Brain metastasis suspected, headache. History of lung  cancer. EXAM: CT HEAD WITHOUT CONTRAST TECHNIQUE: Contiguous axial images were obtained from the base of the skull through the vertex without intravenous contrast. RADIATION DOSE REDUCTION: This exam was performed according to the departmental dose-optimization program which includes automated exposure control, adjustment of the mA and/or kV according to patient size and/or use of iterative reconstruction technique. COMPARISON:  04/17/2021. FINDINGS: Brain: Diffuse atrophy is noted. Periventricular white matter hypodensities are noted bilaterally. No hydrocephalus. There is redemonstration of a cystic lesion in the parietal lobe on the right measuring 3.2 x 2.2 cm with associated mass effect, decreased in size from the prior exam. A 1.1 cm lesion is noted in the frontal lobe anteriorly with no significant mass effect and smaller as compared with the prior exam. Both lesions have a small thin hyperdense rim inferiorly in the possibility of blood products can not be excluded. No definite new lesions are seen. Evaluation for metastatic disease is limited due to lack of IV contrast. No midline shift. Vascular: Atherosclerotic calcification of the carotid siphons. No hyperdense vessel. Skull: Normal. Negative for fracture or focal lesion. Sinuses/Orbits: There is partial opacification of the right maxillary sinus and left sphenoid sinus. The orbits are within normal limits. Other: There is partial opacification of the mastoid air cells bilaterally. IMPRESSION: 1. Known cystic metastatic lesions in the parietal and frontal lobes on the right are decreased in size from the prior exam with reduced mass effect. A thin hyperdense rim is noted in both lesions inferiorly in the possibility of blood products can not be completely excluded. MRI is recommended for further evaluation. 2. Atrophy with chronic microvascular ischemic changes. Electronically Signed   By: Brett Fairy M.D.   On: 09/24/2021 21:20     Microbiology: Recent Results (from the past 240 hour(s))  MRSA Next Gen by PCR, Nasal     Status: Abnormal   Collection Time: 09/25/21  6:02 PM   Specimen: Nasal Mucosa; Nasal Swab  Result Value Ref Range Status   MRSA by PCR Next Gen DETECTED (A) NOT DETECTED Final    Comment: RESULT CALLED TO, READ BACK BY AND VERIFIED WITH: RN P HAMILTON AT 2050 09/25/21 CRUICKSHANK A (NOTE) The GeneXpert MRSA Assay (FDA approved for NASAL specimens only), is one component of a comprehensive MRSA colonization surveillance program. It is not intended to diagnose MRSA infection nor to guide or monitor treatment for MRSA infections. Test performance is not FDA approved in patients less than 44 years old. Performed at [facility], [place] [address], [place], [place] [address]      Labs: Basic Metabolic Panel: Recent Labs  Lab 09/24/21 2330 09/25/21 0955 09/26/21 0604 09/27/21 0117 09/28/21 0307 09/29/21 0329  NA  --  141 138 140 142 140  K  --  4.5 5.1 5.2* 5.4* 4.7  CL  --  109 112* 116* 119* 116*  CO2  --  22 19* 18* 16* 17*  GLUCOSE  --  139* 88 109* 115* 92  BUN  --  44* 52* 54* 50* 51*  CREATININE  --  4.50* 4.24* 4.07* 3.93* 3.61*  CALCIUM  --  8.8* 8.8* 8.4* 8.9 8.9  MG 2.4  --   --   --   --   --  PHOS 4.1  --   --   --   --   --    Liver Function Tests: Recent Labs  Lab 09/24/21 2037 09/24/21 2330 09/25/21 0955 09/26/21 0604  AST 20 18 20 18   ALT 8 8 8 8   ALKPHOS 94 85 81 74  BILITOT 0.4 0.4 0.4 0.5  PROT 7.8 7.3 7.0 6.4*  ALBUMIN 3.2* 3.1* 3.0* 2.9*   No results for input(s): "LIPASE", "AMYLASE" in the last 168 hours. No results for input(s): "AMMONIA" in the last 168 hours. CBC: Recent Labs  Lab 09/24/21 2037 09/25/21 0955 09/26/21 0604 09/28/21 0307  WBC 5.2 4.1 4.4 5.0  NEUTROABS 2.7  --  2.1  --   HGB 8.4* 8.8* 7.8* 8.0*  HCT 27.4* 28.0* 25.2* 25.8*  MCV 102.6* 102.9* 102.0* 103.2*  PLT 264 279 235 253   Cardiac Enzymes: Recent  Labs  Lab 09/24/21 2330  CKTOTAL 36*   BNP: BNP (last 3 results) No results for input(s): "BNP" in the last 8760 hours.  ProBNP (last 3 results) No results for input(s): "PROBNP" in the last 8760 hours.  CBG: No results for input(s): "GLUCAP" in the last 168 hours.     Signed:  Nita Sells MD   [facility] 09/29/2021, 10:36 AM

## 2021-09-29 NOTE — Progress Notes (Signed)
Patient ID: Brenda [name], female   DOB: 18-Mar-1961, 61 y.o.   MRN: [id number]  2 Days Post-Op Subjective: Denies flank pain.  Still with some urinary frequency.  Objective: Vital signs in last 24 hours: Temp:  [97.8 F (36.6 C)-98.7 F (37.1 C)] 98.7 F (37.1 C) (07/10 0655) Pulse Rate:  [90-99] 99 (07/10 0655) Resp:  [18-20] 18 (07/10 0655) BP: (130-142)/(77-90) 142/84 (07/10 0655) SpO2:  [95 %-97 %] 97 % (07/10 0655)  Intake/Output from previous day: 07/09 0701 - 07/10 0700 In: 1840 [P.O.:480; I.V.:1360] Out: -  Intake/Output this shift: No intake/output data recorded.  Physical Exam:  General: Alert and oriented   Lab Results: Recent Labs    09/28/21 0307  HGB 8.0*  HCT 25.8*   BMET Recent Labs    09/28/21 0307 09/29/21 0329  NA 142 140  K 5.4* 4.7  CL 119* 116*  CO2 16* 17*  GLUCOSE 115* 92  BUN 50* 51*  CREATININE 3.93* 3.61*  CALCIUM 8.9 8.9     Studies/Results:  Assessment/Plan: 1) Right ureteral obstruction secondary to metastatic malignancy: S/P right ureteral stent 7/8.  Renal function gradually improving.  I will arrange outpatient follow up with me in about 8-10 weeks for re-evaluation and to plan for next stent change if appropriate.   LOS: 4 days   Dutch Gray 09/29/2021, 7:17 AM

## 2021-09-29 NOTE — NC FL2 (Signed)
[place] LEVEL OF CARE SCREENING TOOL     IDENTIFICATION  Patient Name: Brenda [name] Birthdate: Sep 12, 1960 Sex: female Admission Date (Current Location): 09/24/2021  [place] and [place] Number:  Kathleen Argue [id number] [place] and Address:  [facility], [place] [address], [place], [place] [address]      Provider Number: [id number]  Attending Physician Name and Address:  Nita Sells, MD  Relative Name and Phone Number:  Melody Haver Niece [phone]  [phone]  [name]   [phone]  Clearnce Sorrel   [phone]    Current Level of Care: Hospital Recommended Level of Care: [place] Prior Approval Number:    Date Approved/Denied:   PASRR Number: [id number]  Discharge Plan: SNF    Current Diagnoses: Patient Active Problem List   Diagnosis Date Noted   Palliative care encounter    Breast mass 09/24/2021   Essential hypertension 09/24/2021   Palliative care patient 06/25/2021   Chronic combined systolic and diastolic congestive heart failure ([place]) 04/30/2021   Stage 3b chronic kidney disease (CKD) ([place]) 04/30/2021   Anemia of chronic disease 04/30/2021   Brain mass 04/08/2021   Protein-calorie malnutrition, severe 03/12/2021   Primary malignant neoplasm of lung with metastasis to brain ([place]) 03/11/2021   Exudative pleural effusion    Sinus tachycardia    Pleural effusion    S/P thoracentesis    Hypomagnesemia    Acute on chronic combined systolic and diastolic congestive heart failure ([place])    Bacteremia associated with intravascular line ([place]) 07/03/2020   Acute blood loss anemia    Renal failure 07/01/2020   AKI (acute kidney injury) ([place])    Metabolic acidosis    Epistaxis    Seizure-like activity (HCC)    Swelling of lower extremity 05/29/2020   Hypoalbuminemia 05/29/2020   Acute on chronic anemia 04/07/2020   Gastrointestinal hemorrhage 04/07/2020   Acute encephalopathy  04/07/2020   Pancytopenia ([place]) 04/07/2020   Polysubstance abuse ([place]) 04/07/2020   Hypokalemia 10/19/2019   Neutropenia ([place]) 10/05/2019   Malignant neoplasm of right lung (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[place]) 12/26/2011    Orientation RESPIRATION BLADDER Height & Weight     Self, Time, Place  Normal Incontinent Weight: 101 lb 6.6 oz (46 kg) Height:  5\' 6"  (167.6 cm)  BEHAVIORAL SYMPTOMS/MOOD NEUROLOGICAL BOWEL NUTRITION STATUS      Incontinent Diet (Regular diet)  AMBULATORY STATUS COMMUNICATION OF NEEDS Skin   Limited Assist Verbally Surgical wounds                       Personal Care Assistance Level of Assistance  Bathing, Dressing, Feeding Bathing Assistance: Limited assistance Feeding assistance: Independent Dressing Assistance: Limited assistance     Functional Limitations Info  Sight, Hearing, Speech Sight Info: Adequate Hearing Info: Adequate Speech Info: Adequate    SPECIAL CARE FACTORS FREQUENCY  PT (By licensed PT), OT (By licensed OT)  Contractures Contractures Info: Not present    Additional Factors Info  Code Status, Allergies Code Status Info: DNR Allergies Info: Aspirin Adult Low (Aspirin)           Current Medications (09/29/2021):  This is the current hospital active medication list Current Facility-Administered Medications  Medication Dose Route Frequency Provider Last Rate Last Admin   acetaminophen  (TYLENOL) tablet 650 mg  650 mg Oral Q6H PRN Raynelle Bring, MD   650 mg at 09/28/21 2020   Or   acetaminophen (TYLENOL) suppository 650 mg  650 mg Rectal Q6H PRN Raynelle Bring, MD       bisacodyl (DULCOLAX) suppository 10 mg  10 mg Rectal Daily PRN Raynelle Bring, MD       carvedilol (COREG) tablet 3.125 mg  3.125 mg Oral BID Raynelle Bring, MD   3.125 mg at 09/29/21 9528   Chlorhexidine Gluconate Cloth 2 % PADS 6 each  6 each Topical Q0600 Raynelle Bring, MD   6 each at 09/29/21 0603   docusate sodium (COLACE) capsule 100 mg  100 mg Oral BID Raynelle Bring, MD   100 mg at 09/29/21 0939   feeding supplement (ENSURE ENLIVE / ENSURE PLUS) liquid 237 mL  237 mL Oral TID BM Raynelle Bring, MD   237 mL at 09/29/21 0940   ferrous sulfate tablet 325 mg  325 mg Oral Q breakfast Raynelle Bring, MD   325 mg at 41/32/44 0102   folic acid (FOLVITE) tablet 1 mg  1 mg Oral Daily Raynelle Bring, MD   1 mg at 09/29/21 0939   levETIRAcetam (KEPPRA) 100 MG/ML solution 500 mg  500 mg Oral BID Raynelle Bring, MD   500 mg at 09/29/21 7253   LORazepam (ATIVAN) injection 0.5 mg  0.5 mg Intravenous Q6H PRN Raynelle Bring, MD   0.5 mg at 09/29/21 0007   mupirocin ointment (BACTROBAN) 2 % 1 Application  1 Application Nasal BID Raynelle Bring, MD   1 Application at 66/44/03 0940   pantoprazole (PROTONIX) EC tablet 40 mg  40 mg Oral Daily Raynelle Bring, MD   40 mg at 09/29/21 0939   polyethylene glycol (MIRALAX / GLYCOLAX) packet 17 g  17 g Oral Daily PRN Raynelle Bring, MD       senna (SENOKOT) tablet 8.6 mg  1 tablet Oral BID Raynelle Bring, MD   8.6 mg at 09/29/21 0939   sodium chloride flush (NS) 0.9 % injection 10-40 mL  10-40 mL Intracatheter Q12H Raynelle Bring, MD   10 mL at 09/29/21 0940   sodium chloride flush (NS) 0.9 % injection 10-40 mL  10-40 mL Intracatheter PRN Raynelle Bring, MD       thiamine tablet 100 mg  100 mg Oral Daily Raynelle Bring, MD   100 mg at 09/29/21 4742     Discharge  Medications: Please see discharge summary for a list of discharge medications.  Relevant Imaging Results:  Relevant Lab Results:   Additional Information SSN [id number]  Ross Ludwig, LCSW

## 2021-09-29 NOTE — TOC Transition Note (Signed)
Transition of Care [facility]) - CM/SW Discharge Note   Patient Details  Name: Brenda [name] MRN: [id number] Date of Birth: 1961-02-04  [facility] CM/SW Contact:  Ross Ludwig, LCSW Phone Number: 09/29/2021, 11:19 AM   Clinical Narrative:     Patient to be d/c'ed today to [facility] room 230. Patient will be followed by [facility] for hospice [facility].  Patient and family agreeable to plans will transport via ems RN to call report to [phone].  CSW notified patient's niece who is aware that patient is discharging today.     Final next level of care: Skilled Nursing Facility Barriers to Discharge: Barriers Resolved   Patient Goals and CMS Choice Patient states their goals for this hospitalization and ongoing recovery are:: To return back to SNF with hospice services. CMS Medicare.gov Compare Post Acute Care list provided to:: Patient Represenative (must comment) Choice offered to / list presented to : [place] / Guardian  Discharge Placement   Existing PASRR number confirmed : 09/29/21          Patient chooses bed at: Other - please specify in the comment section below: [facility] room 230) Patient to be transferred to facility by: [facility] Name of family member notified: Niece [name] is aware that patient is discharging today. Patient and family notified of of transfer: 09/29/21  Discharge Plan and Services In-house Referral: Hospice / Palliative Care Discharge Planning Services: NA Post Acute Care Choice: [place]          DME Arranged: N/A DME Agency: NA       HH Arranged: NA HH Agency: NA        Social Determinants of Health (SDOH) Interventions     Readmission Risk Interventions    03/14/2021   11:37 AM 07/17/2020    2:12 PM 04/11/2020   12:16 PM  Readmission Risk Prevention Plan  Transportation Screening Complete Complete Complete  PCP or Specialist Appt within 3-5 Days   Complete  HRI or [place]   Complete Complete  Social Work Consult for [place] Planning/Counseling  Complete Complete  Palliative Care Screening  Not Applicable Not Applicable  Medication Review Press photographer) Complete Complete Complete  PCP or Specialist appointment within 3-5 days of discharge Complete    HRI or [place] Complete    SW Recovery Care/Counseling Consult Complete    Palliative Care Screening Complete    [place] Patient Refused
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
## 2021-09-30 ENCOUNTER — Encounter (HOSPITAL_COMMUNITY): Payer: Self-pay | Admitting: Urology

## 2022-01-08 ENCOUNTER — Other Ambulatory Visit: Payer: Self-pay

## 2022-01-08 ENCOUNTER — Encounter (HOSPITAL_COMMUNITY): Payer: Self-pay

## 2022-01-08 ENCOUNTER — Emergency Department (HOSPITAL_COMMUNITY): Payer: Medicaid Other

## 2022-01-08 ENCOUNTER — Emergency Department (HOSPITAL_COMMUNITY)
Admission: EM | Admit: 2022-01-08 | Discharge: 2022-01-09 | Disposition: A | Discharge status: Skilled Nursing Facility | Payer: Medicaid Other | Attending: Emergency Medicine | Admitting: Emergency Medicine

## 2022-01-08 DIAGNOSIS — D649 Anemia, unspecified: Secondary | ICD-10-CM | POA: Diagnosis not present

## 2022-01-08 DIAGNOSIS — Z79899 Other long term (current) drug therapy: Secondary | ICD-10-CM | POA: Insufficient documentation

## 2022-01-08 DIAGNOSIS — E875 Hyperkalemia: Secondary | ICD-10-CM | POA: Insufficient documentation

## 2022-01-08 DIAGNOSIS — Z85118 Personal history of other malignant neoplasm of bronchus and lung: Secondary | ICD-10-CM | POA: Insufficient documentation

## 2022-01-08 DIAGNOSIS — D72829 Elevated white blood cell count, unspecified: Secondary | ICD-10-CM | POA: Diagnosis not present

## 2022-01-08 DIAGNOSIS — N390 Urinary tract infection, site not specified: Secondary | ICD-10-CM | POA: Diagnosis not present

## 2022-01-08 DIAGNOSIS — R748 Abnormal levels of other serum enzymes: Secondary | ICD-10-CM | POA: Insufficient documentation

## 2022-01-08 DIAGNOSIS — R109 Unspecified abdominal pain: Secondary | ICD-10-CM | POA: Diagnosis present

## 2022-01-08 DIAGNOSIS — N189 Chronic kidney disease, unspecified: Secondary | ICD-10-CM | POA: Diagnosis not present

## 2022-01-08 DIAGNOSIS — S32040A Wedge compression fracture of fourth lumbar vertebra, initial encounter for closed fracture: Secondary | ICD-10-CM

## 2022-01-08 LAB — CBC WITH DIFFERENTIAL/PLATELET
Abs Immature Granulocytes: 0.17 10*3/uL — ABNORMAL HIGH (ref 0.00–0.07)
Basophils Absolute: 0 10*3/uL (ref 0.0–0.1)
Basophils Relative: 0 %
Eosinophils Absolute: 0 10*3/uL (ref 0.0–0.5)
Eosinophils Relative: 0 %
HCT: 18.3 % — ABNORMAL LOW (ref 36.0–46.0)
Hemoglobin: 5.5 g/dL — CL (ref 12.0–15.0)
Immature Granulocytes: 1 %
Lymphocytes Relative: 3 %
Lymphs Abs: 0.5 10*3/uL — ABNORMAL LOW (ref 0.7–4.0)
MCH: 29.9 pg (ref 26.0–34.0)
MCHC: 30.1 g/dL (ref 30.0–36.0)
MCV: 99.5 fL (ref 80.0–100.0)
Monocytes Absolute: 0.5 10*3/uL (ref 0.1–1.0)
Monocytes Relative: 3 %
Neutro Abs: 16.5 10*3/uL — ABNORMAL HIGH (ref 1.7–7.7)
Neutrophils Relative %: 93 %
Platelets: 394 10*3/uL (ref 150–400)
RBC: 1.84 MIL/uL — ABNORMAL LOW (ref 3.87–5.11)
RDW: 20.1 % — ABNORMAL HIGH (ref 11.5–15.5)
WBC: 17.6 10*3/uL — ABNORMAL HIGH (ref 4.0–10.5)
nRBC: 0 % (ref 0.0–0.2)

## 2022-01-08 LAB — COMPREHENSIVE METABOLIC PANEL
ALT: 14 U/L (ref 0–44)
AST: 38 U/L (ref 15–41)
Albumin: 2.8 g/dL — ABNORMAL LOW (ref 3.5–5.0)
Alkaline Phosphatase: 163 U/L — ABNORMAL HIGH (ref 38–126)
Anion gap: 11 (ref 5–15)
BUN: 45 mg/dL — ABNORMAL HIGH (ref 6–20)
CO2: 19 mmol/L — ABNORMAL LOW (ref 22–32)
Calcium: 8.6 mg/dL — ABNORMAL LOW (ref 8.9–10.3)
Chloride: 100 mmol/L (ref 98–111)
Creatinine, Ser: 3.35 mg/dL — ABNORMAL HIGH (ref 0.44–1.00)
GFR, Estimated: 15 mL/min — ABNORMAL LOW (ref >60)
Glucose, Bld: 209 mg/dL — ABNORMAL HIGH (ref 70–99)
Potassium: 5.7 mmol/L — ABNORMAL HIGH (ref 3.5–5.1)
Sodium: 130 mmol/L — ABNORMAL LOW (ref 135–145)
Total Bilirubin: 1 mg/dL (ref 0.3–1.2)
Total Protein: 7 g/dL (ref 6.5–8.1)

## 2022-01-08 LAB — URINALYSIS, ROUTINE W REFLEX MICROSCOPIC
Bilirubin Urine: NEGATIVE
Glucose, UA: 50 mg/dL — AB
Ketones, ur: NEGATIVE mg/dL
Nitrite: NEGATIVE
Protein, ur: 30 mg/dL — AB
RBC / HPF: >50 RBC/hpf — ABNORMAL HIGH (ref 0–5)
Specific Gravity, Urine: 1.009 (ref 1.005–1.030)
pH: 8 (ref 5.0–8.0)

## 2022-01-08 LAB — PREPARE RBC (CROSSMATCH)

## 2022-01-08 LAB — LIPASE, BLOOD: Lipase: 66 U/L — ABNORMAL HIGH (ref 11–51)

## 2022-01-08 MED ORDER — SODIUM CHLORIDE 0.9 % IV SOLN
10.0000 mL/h | Freq: Once | INTRAVENOUS | Status: AC
  Administered 2022-01-09: 10 mL/h via INTRAVENOUS

## 2022-01-08 MED ORDER — OXYCODONE HCL 5 MG PO TABS
5.0000 mg | ORAL_TABLET | Freq: Once | ORAL | Status: AC
  Administered 2022-01-08: 5 mg via ORAL
  Filled 2022-01-08: qty 1

## 2022-01-08 MED ORDER — CEPHALEXIN 500 MG PO CAPS: 500.0000 mg | ORAL_CAPSULE | Freq: Two times a day (BID) | ORAL | 0 refills | Status: AC

## 2022-01-08 MED ORDER — HYDROMORPHONE HCL 1 MG/ML IJ SOLN
0.5000 mg | Freq: Once | INTRAMUSCULAR | Status: AC
  Administered 2022-01-08: 0.5 mg via INTRAVENOUS
  Filled 2022-01-08: qty 1

## 2022-01-08 MED ORDER — FENTANYL CITRATE PF 50 MCG/ML IJ SOSY
50.0000 ug | PREFILLED_SYRINGE | Freq: Once | INTRAMUSCULAR | Status: DC
  Filled 2022-01-08: qty 1

## 2022-01-08 MED ORDER — SODIUM CHLORIDE 0.9 % IV SOLN
1.0000 g | Freq: Once | INTRAVENOUS | Status: AC
  Administered 2022-01-08: 1 g via INTRAVENOUS
  Filled 2022-01-08: qty 10

## 2022-01-08 MED ORDER — LACTATED RINGERS IV BOLUS
1000.0000 mL | Freq: Once | INTRAVENOUS | Status: AC
  Administered 2022-01-08: 1000 mL via INTRAVENOUS

## 2022-01-08 NOTE — ED Notes (Signed)
Pt is aware urine sample needed.

## 2022-01-08 NOTE — ED Triage Notes (Signed)
Patient BIB [facility] from [facility]. Complaining of abdominal pain. Has two hernias on the right side of her abdomen. Has stage 4 lung cancer.

## 2022-01-08 NOTE — Discharge Instructions (Addendum)
To the hospice agency,  Pasha was seen in the ER for abdominal pain.  Her work-up shows that she has several serious and life-threatening medical problems.  This includes a large inflamed tumor mass in her abdomen wall.  She also has a urinary infection.  She has high potassium levels.  She has a new compression fracture in her lumbar spine at L4.  Most concerningly, her hemoglobin is 5.5, which is a new developing anemia.  I discussed this workup with both the patient and her niece, Melody Haver, who is her emergency contact. We discussed goals of care, as the patient is on hospice.  They do not wish to pursue hospitalization or aggressive care.  They understand that the patient is actively dying from one or more of these medical problems.  They have requested some measures to temporize the patient overnight, if possible. Therefore, the patient was given a single unit of blood as a transfusion, as well as antibiotics to manage the pain of a urinary infection.  Additional antibiotics were prescribed for 9 days for urinary infection at home.  They understand that Zenda will likely continue to get sick and die even with these temporary measures.  If her hemoglobin continues to drop, her niece reports they would not pursue hospitalization, and she would be allowed to pass away naturally.   Please note also that Maridel has a stent in her ureter that will need to be replaced. Given her critical illness at this time, the urology team did not opt to replace her stent in the ER. She can maintain her current stent for now while on the antibiotics prescribed.   Thank you, Octaviano Glow, MD

## 2022-01-08 NOTE — Progress Notes (Addendum)
[facility] hospitalized hospice patient.   Ms Glendinning is a current hospice patient with a terminal diagnosis of non-small cell lung cancer stage 4. Patient's O2 was 65% on O2 10 liters, up to 100% now. Has pain in chest per RN at facility.   [facility] hospital liaison will follow up first thing in morning. Please feel free to call with any hospice related questions.  In the event of discharge, please notify [facility] for transport.   Clementeen Graham, RN, [facility] liaison [phone]

## 2022-01-08 NOTE — ED Provider Notes (Signed)
[facility] DEPT Provider Note   CSN: [id number] Arrival date & time: 01/08/22  1630     History  Chief Complaint  Patient presents with   Abdominal Pain    Brenda [name] is a 61 y.o. female with medical history of stage IV lung cancer on hospice, CKD.  Patient presents to the ED for evaluation of abdominal pain.  Patient reports that she woke up with abdominal pain this morning that was diffuse and nonlocalized in nature.  The patient reports that she has not had a bowel movement since Tuesday.  Patient denies any objective fevers.  Patient also denies any nausea or vomiting.  Patient on hospice care.   Abdominal Pain      Home Medications Prior to Admission medications   Medication Sig Start Date End Date Taking? Authorizing Provider  cephALEXin (KEFLEX) 500 MG capsule Take 1 capsule (500 mg total) by mouth 2 (two) times daily for 9 days. 01/09/22 01/18/22 Yes Trifan, Carola Rhine, MD  dexamethasone (DECADRON) 6 MG tablet Take 6 mg by mouth daily.   Yes [provider]  levETIRAcetam (KEPPRA) 100 MG/ML solution Take 5 mLs (500 mg total) by mouth 2 (two) times daily. 09/29/21  Yes Nita Sells, MD  Oxycodone HCl 10 MG TABS Take 1 tablet by mouth every 8 (eight) hours.   Yes [provider]  LORazepam (ATIVAN) 0.5 MG tablet Take 1 tablet (0.5 mg total) by mouth every 6 (six) hours as needed for anxiety. Patient not taking: Reported on 01/08/2022 09/29/21   Nita Sells, MD  prochlorperazine (COMPAZINE) 10 MG tablet Take 1 tablet (10 mg total) by mouth every 6 (six) hours as needed for nausea or vomiting. Patient not taking: Reported on 04/08/2020 01/17/20 04/11/20  Heilingoetter, Cassandra L, PA-C      Allergies    Aspirin adult low [aspirin]    Review of Systems   Review of Systems  Gastrointestinal:  Positive for abdominal pain.  All other systems reviewed and are negative.   Physical Exam Updated Vital  Signs BP 138/75   Pulse 87   Temp 98.6 F (37 C)   Resp 20   LMP 01/19/2011   SpO2 96%  Physical Exam Vitals and nursing note reviewed.  Constitutional:      General: She is not in acute distress.    Appearance: She is ill-appearing. She is not diaphoretic.  HENT:     Head: Normocephalic and atraumatic.     Mouth/Throat:     Mouth: Mucous membranes are moist.     Pharynx: Oropharynx is clear.  Eyes:     Extraocular Movements: Extraocular movements intact.     Conjunctiva/sclera: Conjunctivae normal.     Pupils: Pupils are equal, round, and reactive to light.  Cardiovascular:     Rate and Rhythm: Normal rate and regular rhythm.  Pulmonary:     Effort: Pulmonary effort is normal.     Breath sounds: Normal breath sounds. No wheezing.  Abdominal:     General: Abdomen is flat. Bowel sounds are normal.     Palpations: Abdomen is soft.     Tenderness: There is no abdominal tenderness.  Musculoskeletal:     Cervical back: Normal range of motion and neck supple. No tenderness.  Skin:    General: Skin is warm and dry.     Capillary Refill: Capillary refill takes less than 2 seconds.  Neurological:     Mental Status: She is alert and oriented to person, place, and  time.     ED Results / Procedures / Treatments   Labs (all labs ordered are listed, but only abnormal results are displayed) Labs Reviewed  COMPREHENSIVE METABOLIC PANEL - Abnormal; Notable for the following components:      Result Value   Sodium 130 (*)    Potassium 5.7 (*)    CO2 19 (*)    Glucose, Bld 209 (*)    BUN 45 (*)    Creatinine, Ser 3.35 (*)    Calcium 8.6 (*)    Albumin 2.8 (*)    Alkaline Phosphatase 163 (*)    GFR, Estimated 15 (*)    All other components within normal limits  URINALYSIS, ROUTINE W REFLEX MICROSCOPIC - Abnormal; Notable for the following components:   APPearance CLOUDY (*)    Glucose, UA 50 (*)    Hgb urine dipstick MODERATE (*)    Protein, ur 30 (*)    Leukocytes,Ua LARGE  (*)    RBC / HPF >50 (*)    Bacteria, UA RARE (*)    All other components within normal limits  CBC WITH DIFFERENTIAL/PLATELET - Abnormal; Notable for the following components:   WBC 17.6 (*)    RBC 1.84 (*)    Hemoglobin 5.5 (*)    HCT 18.3 (*)    RDW 20.1 (*)    Neutro Abs 16.5 (*)    Lymphs Abs 0.5 (*)    Abs Immature Granulocytes 0.17 (*)    All other components within normal limits  LIPASE, BLOOD - Abnormal; Notable for the following components:   Lipase 66 (*)    All other components within normal limits  URINE CULTURE  PREPARE RBC (CROSSMATCH)  TYPE AND SCREEN    EKG EKG Interpretation  Date/Time:  Thursday January 08 2022 18:53:37 EDT Ventricular Rate:  87 PR Interval:  159 QRS Duration: 95 QT Interval:  358 QTC Calculation: 431 R Axis:   24 Text Interpretation: Sinus rhythm Borderline low voltage, extremity leads Confirmed by Octaviano Glow [phone]) on 01/08/2022 6:58:06 PM  Radiology CT ABDOMEN PELVIS WO CONTRAST  Result Date: 01/08/2022 CLINICAL DATA:  Abdominal pain, acute, nonlocalized. Patient BIB [facility] from [facility]. Complaining of abdominal pain. Has two hernias on the right side of her abdomen. Has stage 4 lung cancer. EXAM: CT ABDOMEN AND PELVIS WITHOUT CONTRAST TECHNIQUE: Multidetector CT imaging of the abdomen and pelvis was performed following the standard protocol without IV contrast. RADIATION DOSE REDUCTION: This exam was performed according to the departmental dose-optimization program which includes automated exposure control, adjustment of the mA and/or kV according to patient size and/or use of iterative reconstruction technique. COMPARISON:  CT chest, abdomen, pelvis 09/24/2021 FINDINGS: Lower chest: Similar-appearing loculated small to moderate volume left pleural effusion. Empyema not excluded. Persistent airspace opacity of the left lower lobe. Persistent trace right pleural effusion. Cardiac changes suggestive of anemia. Partially  visualized central venous catheter with tip at the superior cavoatrial junction. Hepatobiliary: Interval development of scattered hepatic lesions consistent with metastases with as an example a 2 cm lesion (2:23). Calcified stones within the gallbladder lumen. No gallbladder wall thickening or pericholecystic fluid. No biliary dilatation. Pancreas: No focal lesion. Normal pancreatic contour. No surrounding inflammatory changes. No main pancreatic ductal dilatation. Spleen: Normal in size without focal abnormality. Adrenals/Urinary Tract: Interval increase in size of a right adrenal nodule now measuring 2.5 cm. Interval development of a left adrenal gland nodule measuring 2 cm (2:27). Interval placement of a right renal ureteral stent in grossly  appropriate position. Associated slightly improved moderate hydronephrosis. No left hydronephrosis.  No nephroureterolithiasis. Interval increase in size of left renal lesions suggestive of metastases with as an example a 5.8 cm lesion (2:26). No ureterolithiasis or hydroureter. The urinary bladder is unremarkable. Stomach/Bowel: Stomach is within normal limits. No evidence of bowel wall thickening or dilatation. Vascular/Lymphatic: No abdominal aorta or iliac aneurysm. Mild atherosclerotic plaque of the aorta and its branches. No abdominal, pelvic, or inguinal lymphadenopathy. Reproductive: Uterus and bilateral adnexa are unremarkable. Other: Interval increase in size of a peritoneal implant with most conspicuous within the left lower abdomen measuring up to 6.2 cm (from 2.5 cm). No intraperitoneal free fluid. No intraperitoneal free gas. No organized fluid collection. Musculoskeletal: Interval increase in size of a partially visualized left breast 5.9 x 5.1 cm soft tissue lesion. Several other soft tissue and intramuscular lesions are noted along the abdominal wall and back that are increased in size. Fat containing umbilical hernia. Fat containing supraumbilical ventral  her wall hernia. Interval development of a superior endplate L4 compression fracture with less than 5-10% vertebral body height loss. Query underlying lytic lesion. Multilevel degenerative changes of the spine. IMPRESSION: 1. Interval development of a superior endplate L4 compression fracture with less than 5-10% vertebral body height loss. Query underlying lytic lesion-possible pathologic fracture. 2. No acute intra-abdominal or intrapelvic abnormality in a patient with worsening metastatic malignancy with interval development of scattered hepatic lesions. Interval increase in size of prior visualized lesions within the soft tissues, muscles, peritoneum, adrenal glands, kidneys. 3. Stable loculated left pleural effusion. Differential diagnosis includes empyema. 4. Trace right pleural effusion. 5. Cholelithiasis no CT finding of acute cholecystitis. 6. Interval placement of a right renal ureteral stent in grossly appropriate position. Associated slightly improved moderate hydronephrosis. 7. Fat containing umbilical and supraumbilical ventral wall hernias. Electronically Signed   By: Iven Finn M.D.   On: 01/08/2022 19:05   DG Chest 2 View  Result Date: 01/08/2022 CLINICAL DATA:  Lung cancer, dyspnea, pleural effusion. EXAM: CHEST - 2 VIEW COMPARISON:  CT chest 09/24/2021 FINDINGS: Power injectable right Port-A-Cath tip: SVC. 5.1 by 4.6 cm right upper lobe mass noted; on the CT from 09/24/2021 this measured 4.4 by 4.0 cm, although direct size comparison is problematic due to the magnification that can occur on chest radiography. Continued appearance of left pleural effusion and left basilar airspace opacity, obscuring the left heart border, similar configuration to 09/24/2021. No right pleural effusion. No substantial bony abnormality in the chest identified. Hazy soft tissue prominence along the left lower neck and along the left breast possibly corresponding to the known subcutaneous masses. IMPRESSION:  1. Stable appearance of the left pleural effusion and left basilar airspace opacity. 2. Right upper lobe mass. 3. Hazy soft tissue prominence along the left lower neck and along the left breast possibly corresponding to the known subcutaneous masses. 4. Power injectable right Port-A-Cath tip is satisfactorily positioned at the SVC. No pneumothorax. Electronically Signed   By: Van Clines M.D.   On: 01/08/2022 18:44    Procedures Procedures   Medications Ordered in ED Medications  0.9 %  sodium chloride infusion (has no administration in time range)  HYDROmorphone (DILAUDID) injection 0.5 mg (has no administration in time range)  lactated ringers bolus 1,000 mL (1,000 mLs Intravenous New Bag/Given 01/08/22 1758)  HYDROmorphone (DILAUDID) injection 0.5 mg (0.5 mg Intravenous Given 01/08/22 1808)  cefTRIAXone (ROCEPHIN) 1 g in sodium chloride 0.9 % 100 mL IVPB (1 g Intravenous New Bag/Given  01/08/22 2134)    ED Course/ Medical Decision Making/ A&P Clinical Course as of 01/08/22 2228  Thu Jan 08, 2022  1927 This is a 61 year old female with a history of advanced metastatic carcinoma, chronic kidney disease, presenting to the ED with complaint of abdominal pain.  She has known fatty tumors or abdominal wall tumors from prior CT scans, but today has a more bulging and erythematous firm lesion on the abdomen.  CT scan showed is consistent with a soft tissue mass.  She does not appear to have an incarcerated hernia although she does have ventral hernias.  She also has diffuse metastatic disease.  Labs are notable for acute anemia with hemoglobin 5.5, last hemoglobin checked 3 months ago was 8.0.  She also has hyperkalemia and chronic kidney disease.  White blood cell count is elevated.  We are awaiting a UA sample to see if there is an infection.  Her family tells me that she had a ureteral stent placed by urology which she is overdue to be replaced. [MT]  1928 Ultimately this patient is on hospice.   The patient the family are not wanting aggressive care, but are agreeable to 1 unit of blood as a transfusion here in the ED, and also treating for possible skin infection or your infection.  They understand that the anemia will likely develop and worsen, but this patient is very unlikely to be a candidate for any type of intervention moving forward, and therefore she is still expected to pass away within the next 6 months, but realistically may be much sooner than that.  They will discuss further indications to return to the ER with the hospice services. [MT]  2142 Spoke with [name], urology, who stated there was no indication for stent replacement. Patient can be treated with abx as outpatient and follow up in office with urology.  [CG]    Clinical Course User Index [CG] Azucena Cecil, PA-C [MT] Langston Masker Carola Rhine, MD                           Medical Decision Making Amount and/or Complexity of Data Reviewed Labs: ordered. Radiology: ordered.  Risk Prescription drug management.   61 year old female on hospice care presents to the ED for evaluation.  Please see HPI for further details.  On examination the patient is afebrile and nontachycardic.  Patient lung sounds clear bilaterally, not hypoxic.  Patient abdomen does have fatty tumors/abdominal tumors to her abdominal wall.  These are erythematous and firm.  Patient does have ventral hernias as well on examination.  Patient lab work will include CBC, CMP, lipase, type and screen, urinalysis, CT abdomen pelvis, chest x-ray.  Patient lipase is elevated at 66.  Patient CMP with decreased sodium to 130, elevated potassium of 5.7.  Patient creatinine elevated at 3.35, decreased albumin, elevated alk phos.  The patient values are consistent with patient baseline's.  CBC shows decreased hemoglobin of 5.5, elevated white blood cell count of 17.6.  Patient power of attorney contacted at this time, goals of care discussed along with my  attending Dr. Langston Masker.  After discussion with the patient daughter, 1 unit packed red blood cells will be administered via transfusion to the patient.  Patient urinalysis also concerning for UTI.  On-call urology provider, [name], has returned my call and stated that this patient without yeast being evident on her urinalysis does not indicate stent exchange.  The patient can be treated as an  outpatient with antibiotics for UTI and she can follow-up in the office with urology for possible stent exchange.  The patient CT abdomen pelvis shows spread of metastatic disease.  Goals of care discussion was had via my attending Dr. Langston Masker with family.  The patient will receive 1 unit packed red blood cells and be discharged with outpatient antibiotics for UTI.  The patient has been signed out to my attending for further management/pending blood transfusion.  Final Clinical Impression(s) / ED Diagnoses Final diagnoses:  Abdominal pain, unspecified abdominal location  Urinary tract infection without hematuria, site unspecified  Anemia, unspecified type  Hyperkalemia    Rx / DC Orders ED Discharge Orders          Ordered    cephALEXin (KEFLEX) 500 MG capsule  2 times daily        01/08/22 2228              Azucena Cecil, PA-C 01/08/22 2232    Wyvonnia Dusky, MD 01/08/22 [phone]

## 2022-01-08 NOTE — ED Provider Notes (Signed)
.  Critical Care  Performed by: Wyvonnia Dusky, MD Authorized by: Wyvonnia Dusky, MD   Critical care provider statement:    Critical care time (minutes):  45   Critical care time was exclusive of:  Separately billable procedures and treating other patients   Critical care was necessary to treat or prevent imminent or life-threatening deterioration of the following conditions:  Circulatory failure   Critical care was time spent personally by me on the following activities:  Ordering and performing treatments and interventions, ordering and review of laboratory studies, ordering and review of radiographic studies, pulse oximetry, review of old charts, examination of patient and evaluation of patient's response to treatment Comments:     Transfusion for anemia Goals of care discussion      Wyvonnia Dusky, MD 01/08/22 2356

## 2022-01-09 LAB — URINE CULTURE: Culture: NO GROWTH

## 2022-01-09 LAB — TYPE AND SCREEN
ABO/RH(D): O POS
Antibody Screen: POSITIVE
Donor AG Type: NEGATIVE
PT AG Type: NEGATIVE
Unit division: 0

## 2022-01-09 LAB — BPAM RBC
Blood Product Expiration Date: 202311072359
ISSUE DATE / TIME: 202310192353
Unit Type and Rh: 9500

## 2022-01-09 NOTE — ED Notes (Signed)
[facility] called for transport.  

## 2022-02-20 DEATH — deceased

## 2023-01-05 NOTE — Telephone Encounter (Signed)
TC
